# Patient Record
Sex: Female | Born: 1955 | Race: White | Hispanic: No | Smoking: Never smoker
Health system: Southern US, Community
[De-identification: ages and names within clinical notes are randomized; demographics above are authoritative.]

## PROBLEM LIST (undated history)

## (undated) DIAGNOSIS — E669 Obesity, unspecified: Secondary | ICD-10-CM

## (undated) DIAGNOSIS — R002 Palpitations: Secondary | ICD-10-CM

## (undated) DIAGNOSIS — M1990 Unspecified osteoarthritis, unspecified site: Secondary | ICD-10-CM

## (undated) DIAGNOSIS — M549 Dorsalgia, unspecified: Secondary | ICD-10-CM

## (undated) DIAGNOSIS — R7303 Prediabetes: Secondary | ICD-10-CM

## (undated) DIAGNOSIS — J45909 Unspecified asthma, uncomplicated: Secondary | ICD-10-CM

## (undated) DIAGNOSIS — F909 Attention-deficit hyperactivity disorder, unspecified type: Secondary | ICD-10-CM

## (undated) DIAGNOSIS — F32A Depression, unspecified: Secondary | ICD-10-CM

## (undated) DIAGNOSIS — M109 Gout, unspecified: Secondary | ICD-10-CM

## (undated) DIAGNOSIS — K219 Gastro-esophageal reflux disease without esophagitis: Secondary | ICD-10-CM

## (undated) DIAGNOSIS — M797 Fibromyalgia: Secondary | ICD-10-CM

## (undated) DIAGNOSIS — E559 Vitamin D deficiency, unspecified: Secondary | ICD-10-CM

## (undated) DIAGNOSIS — E785 Hyperlipidemia, unspecified: Secondary | ICD-10-CM

## (undated) DIAGNOSIS — G4730 Sleep apnea, unspecified: Secondary | ICD-10-CM

## (undated) DIAGNOSIS — Z86711 Personal history of pulmonary embolism: Secondary | ICD-10-CM

## (undated) DIAGNOSIS — I10 Essential (primary) hypertension: Secondary | ICD-10-CM

## (undated) DIAGNOSIS — H269 Unspecified cataract: Secondary | ICD-10-CM

## (undated) DIAGNOSIS — T7840XA Allergy, unspecified, initial encounter: Secondary | ICD-10-CM

## (undated) DIAGNOSIS — R079 Chest pain, unspecified: Secondary | ICD-10-CM

## (undated) DIAGNOSIS — R0602 Shortness of breath: Secondary | ICD-10-CM

## (undated) DIAGNOSIS — M2550 Pain in unspecified joint: Secondary | ICD-10-CM

## (undated) DIAGNOSIS — M25572 Pain in left ankle and joints of left foot: Secondary | ICD-10-CM

## (undated) DIAGNOSIS — F419 Anxiety disorder, unspecified: Secondary | ICD-10-CM

## (undated) HISTORY — PX: FOOT FRACTURE SURGERY: SHX645

## (undated) HISTORY — PX: OTHER SURGICAL HISTORY: SHX169

## (undated) HISTORY — PX: EYE SURGERY: SHX253

## (undated) HISTORY — DX: Unspecified osteoarthritis, unspecified site: M19.90

## (undated) HISTORY — PX: TUBAL LIGATION: SHX77

## (undated) HISTORY — DX: Essential (primary) hypertension: I10

## (undated) HISTORY — PX: ANKLE SURGERY: SHX546

## (undated) HISTORY — DX: Depression, unspecified: F32.A

## (undated) HISTORY — PX: APPENDECTOMY: SHX54

## (undated) HISTORY — PX: FRACTURE SURGERY: SHX138

## (undated) HISTORY — DX: Unspecified asthma, uncomplicated: J45.909

## (undated) HISTORY — DX: Unspecified cataract: H26.9

## (undated) HISTORY — DX: Obesity, unspecified: E66.9

## (undated) HISTORY — DX: Prediabetes: R73.03

## (undated) HISTORY — DX: Fibromyalgia: M79.7

## (undated) HISTORY — DX: Shortness of breath: R06.02

## (undated) HISTORY — DX: Pain in left ankle and joints of left foot: M25.572

## (undated) HISTORY — DX: Hyperlipidemia, unspecified: E78.5

## (undated) HISTORY — DX: Attention-deficit hyperactivity disorder, unspecified type: F90.9

## (undated) HISTORY — DX: Palpitations: R00.2

## (undated) HISTORY — DX: Allergy, unspecified, initial encounter: T78.40XA

## (undated) HISTORY — DX: Personal history of pulmonary embolism: Z86.711

## (undated) HISTORY — DX: Dorsalgia, unspecified: M54.9

## (undated) HISTORY — DX: Anxiety disorder, unspecified: F41.9

## (undated) HISTORY — DX: Pain in unspecified joint: M25.50

## (undated) HISTORY — DX: Chest pain, unspecified: R07.9

## (undated) HISTORY — DX: Vitamin D deficiency, unspecified: E55.9

## (undated) HISTORY — DX: Gastro-esophageal reflux disease without esophagitis: K21.9

---

## 1982-11-04 HISTORY — PX: APPENDECTOMY: SHX54

## 1992-11-04 HISTORY — PX: TUBAL LIGATION: SHX77

## 1996-11-04 HISTORY — PX: KNEE SURGERY: SHX244

## 1998-01-06 ENCOUNTER — Ambulatory Visit (HOSPITAL_COMMUNITY): Admission: RE | Admit: 1998-01-06 | Discharge: 1998-01-06 | Payer: Self-pay | Admitting: *Deleted

## 1998-08-08 ENCOUNTER — Emergency Department (HOSPITAL_COMMUNITY): Admission: EM | Admit: 1998-08-08 | Discharge: 1998-08-08 | Payer: Self-pay | Admitting: Emergency Medicine

## 1998-12-15 ENCOUNTER — Ambulatory Visit (HOSPITAL_COMMUNITY): Admission: RE | Admit: 1998-12-15 | Discharge: 1998-12-15 | Payer: Self-pay | Admitting: *Deleted

## 1999-10-02 ENCOUNTER — Other Ambulatory Visit: Admission: RE | Admit: 1999-10-02 | Discharge: 1999-10-02 | Payer: Self-pay | Admitting: *Deleted

## 2000-03-17 ENCOUNTER — Emergency Department (HOSPITAL_COMMUNITY): Admission: EM | Admit: 2000-03-17 | Discharge: 2000-03-17 | Payer: Self-pay | Admitting: Emergency Medicine

## 2000-06-19 ENCOUNTER — Ambulatory Visit (HOSPITAL_COMMUNITY): Admission: RE | Admit: 2000-06-19 | Discharge: 2000-06-19 | Payer: Self-pay | Admitting: Family Medicine

## 2000-06-19 ENCOUNTER — Encounter: Payer: Self-pay | Admitting: Family Medicine

## 2000-10-19 ENCOUNTER — Encounter: Payer: Self-pay | Admitting: Sports Medicine

## 2000-10-19 ENCOUNTER — Ambulatory Visit (HOSPITAL_COMMUNITY): Admission: RE | Admit: 2000-10-19 | Discharge: 2000-10-19 | Payer: Self-pay | Admitting: Sports Medicine

## 2000-10-29 ENCOUNTER — Ambulatory Visit (HOSPITAL_COMMUNITY): Admission: RE | Admit: 2000-10-29 | Discharge: 2000-10-29 | Payer: Self-pay | Admitting: Internal Medicine

## 2000-10-29 ENCOUNTER — Encounter: Payer: Self-pay | Admitting: Internal Medicine

## 2000-10-31 ENCOUNTER — Encounter (INDEPENDENT_AMBULATORY_CARE_PROVIDER_SITE_OTHER): Payer: Self-pay | Admitting: Specialist

## 2000-10-31 ENCOUNTER — Encounter: Payer: Self-pay | Admitting: Emergency Medicine

## 2000-11-01 ENCOUNTER — Inpatient Hospital Stay (HOSPITAL_COMMUNITY): Admission: EM | Admit: 2000-11-01 | Discharge: 2000-11-03 | Payer: Self-pay | Admitting: Emergency Medicine

## 2000-11-04 HISTORY — PX: FOOT SURGERY: SHX648

## 2001-01-29 ENCOUNTER — Ambulatory Visit (HOSPITAL_COMMUNITY): Admission: RE | Admit: 2001-01-29 | Discharge: 2001-01-29 | Payer: Self-pay | Admitting: Internal Medicine

## 2001-01-29 ENCOUNTER — Encounter: Payer: Self-pay | Admitting: Internal Medicine

## 2001-11-10 ENCOUNTER — Other Ambulatory Visit: Admission: RE | Admit: 2001-11-10 | Discharge: 2001-11-10 | Payer: Self-pay | Admitting: Internal Medicine

## 2001-11-25 ENCOUNTER — Encounter: Payer: Self-pay | Admitting: Internal Medicine

## 2001-11-25 ENCOUNTER — Ambulatory Visit (HOSPITAL_COMMUNITY): Admission: RE | Admit: 2001-11-25 | Discharge: 2001-11-25 | Payer: Self-pay | Admitting: Internal Medicine

## 2005-03-27 ENCOUNTER — Other Ambulatory Visit: Admission: RE | Admit: 2005-03-27 | Discharge: 2005-03-27 | Payer: Self-pay | Admitting: Internal Medicine

## 2005-11-04 HISTORY — PX: ROTATOR CUFF REPAIR: SHX139

## 2006-08-27 ENCOUNTER — Emergency Department (HOSPITAL_COMMUNITY): Admission: EM | Admit: 2006-08-27 | Discharge: 2006-08-28 | Payer: Self-pay | Admitting: Emergency Medicine

## 2009-09-12 ENCOUNTER — Other Ambulatory Visit: Admission: RE | Admit: 2009-09-12 | Discharge: 2009-09-12 | Payer: Self-pay | Admitting: Internal Medicine

## 2009-09-14 ENCOUNTER — Ambulatory Visit (HOSPITAL_COMMUNITY): Admission: RE | Admit: 2009-09-14 | Discharge: 2009-09-14 | Payer: Self-pay | Admitting: Internal Medicine

## 2009-09-14 IMAGING — CR DG CHEST 2V
3 series · 3 of 3 positions shown · non-contrast
Comparison: None

CLINICAL DATA: Chronic cough and congestion off and on for 3 weeks.
Past history of smoking [AGE] ago.

CHEST - 2 VIEW

[view not recorded (1 of 3)]
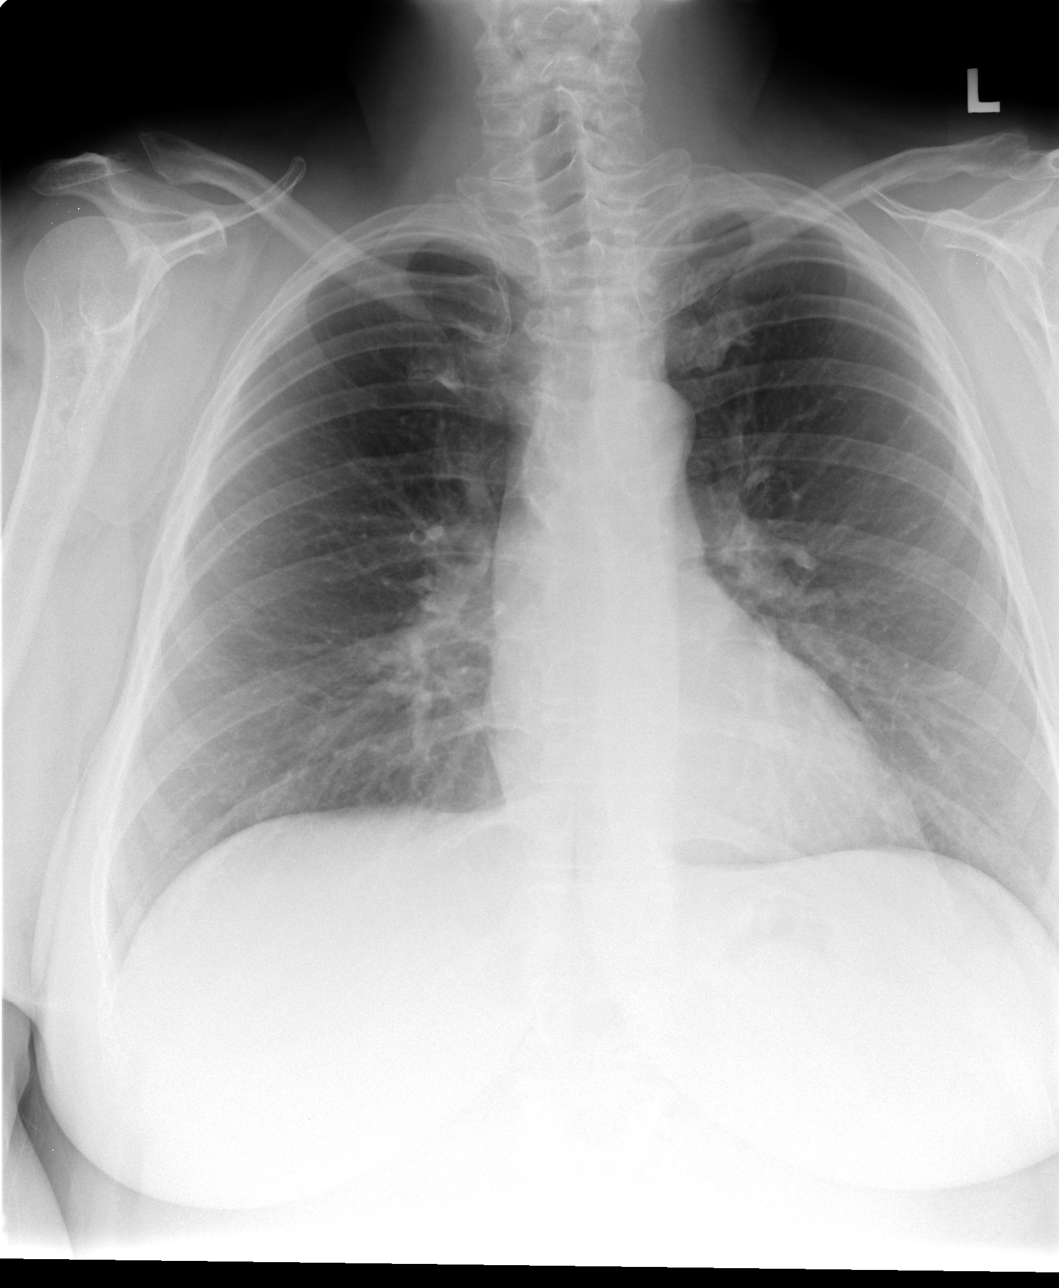

[view not recorded (2 of 3)]
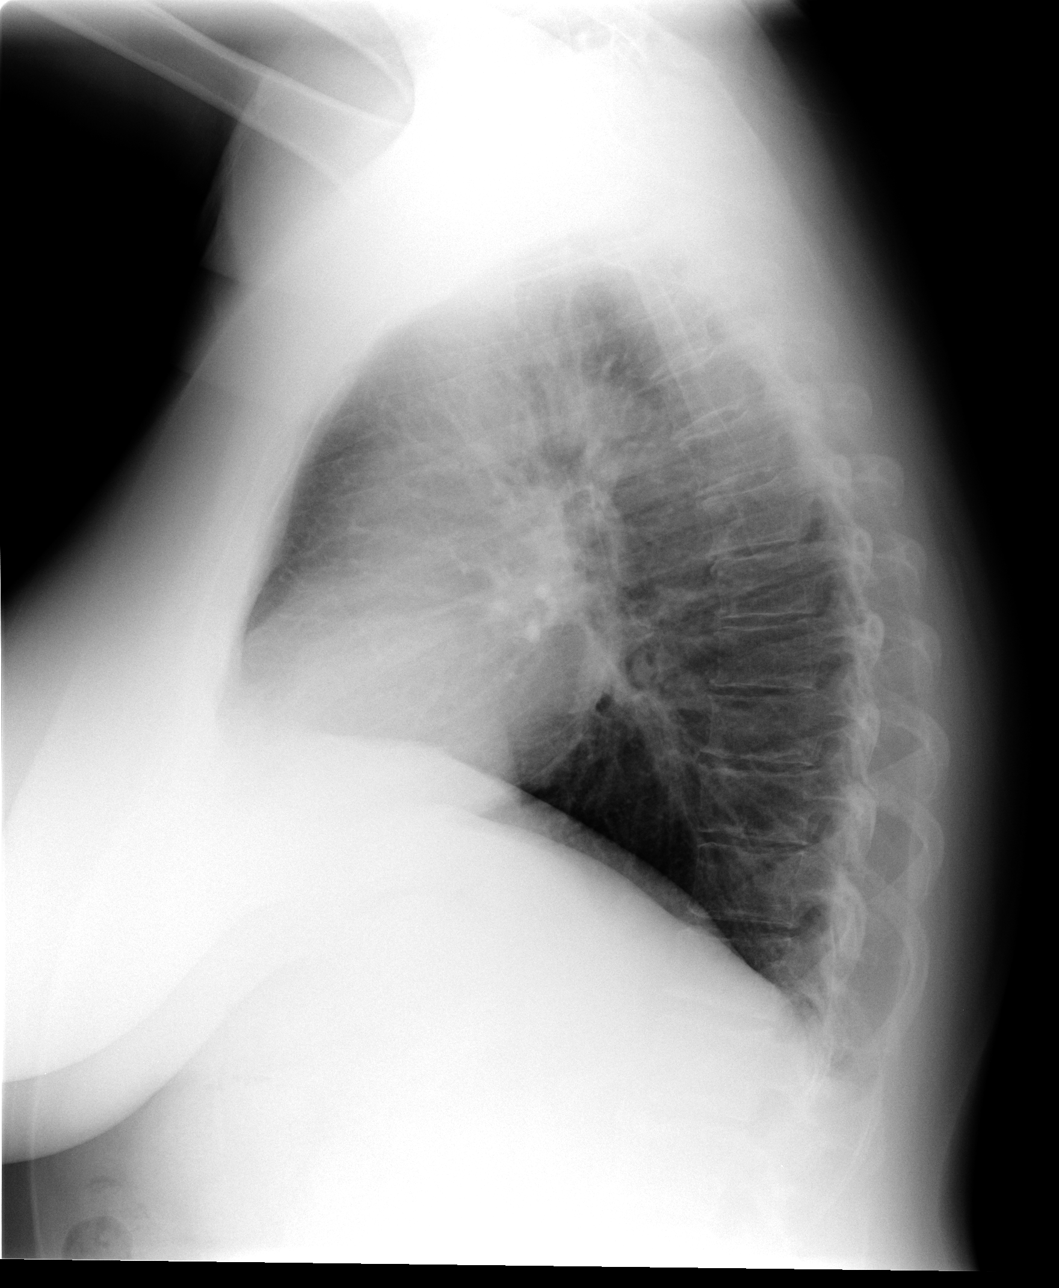

[view not recorded (3 of 3)]
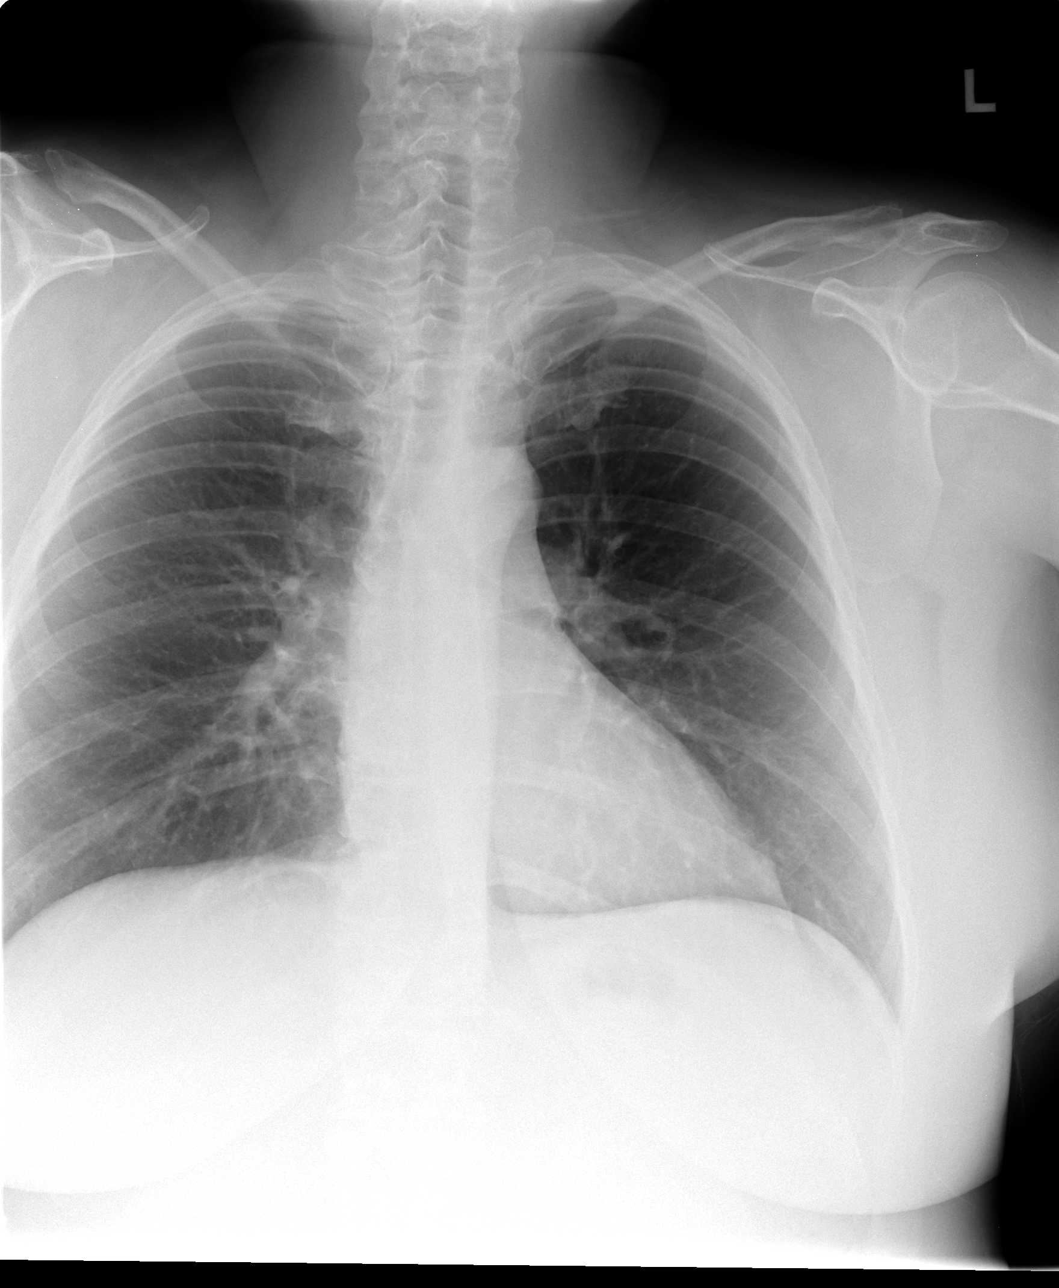

[3 of 3 positions shown; findings below may reference images not displayed]

FINDINGS: Heart and mediastinal contours are within normal limits.
The lung fields appear clear with no evidence for focal infiltrate
or congestive failure.  No pleural fluid is seen.  Mild
peribronchial cuffing is noted and can be seen with bronchitis as
well as a history of smoking.

Bony structures appear intact
IMPRESSION: Mild peribronchial cuffing questionably related to bronchitis or
smoking history.  Otherwise no acute or focal abnormality seen.

## 2009-09-15 ENCOUNTER — Ambulatory Visit (HOSPITAL_COMMUNITY): Admission: RE | Admit: 2009-09-15 | Discharge: 2009-09-15 | Payer: Self-pay | Admitting: Internal Medicine

## 2009-09-22 ENCOUNTER — Ambulatory Visit (HOSPITAL_COMMUNITY): Admission: RE | Admit: 2009-09-22 | Discharge: 2009-09-22 | Payer: Self-pay | Admitting: Internal Medicine

## 2009-09-22 IMAGING — MG MM DIGITAL SCREENING
4 series · 4 of 4 positions shown · non-contrast
Comparison: Prior studies.

DG SCREEN MAMMOGRAM BILATERAL
Bilateral CC and MLO view(s) were taken.

DIGITAL SCREENING MAMMOGRAM WITH CAD:

[R CC]
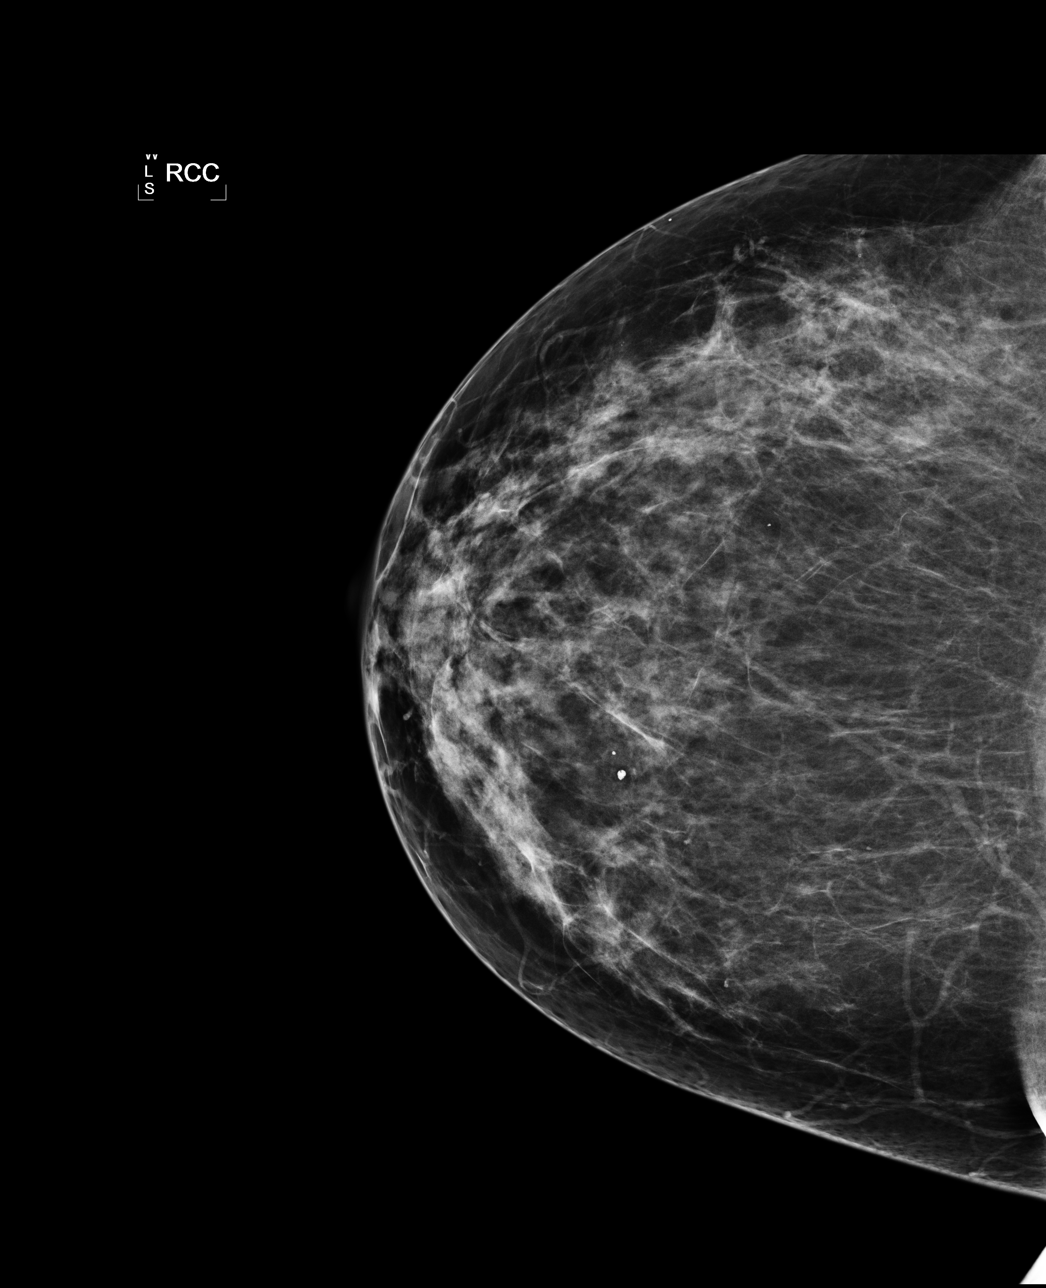

[R MLO]
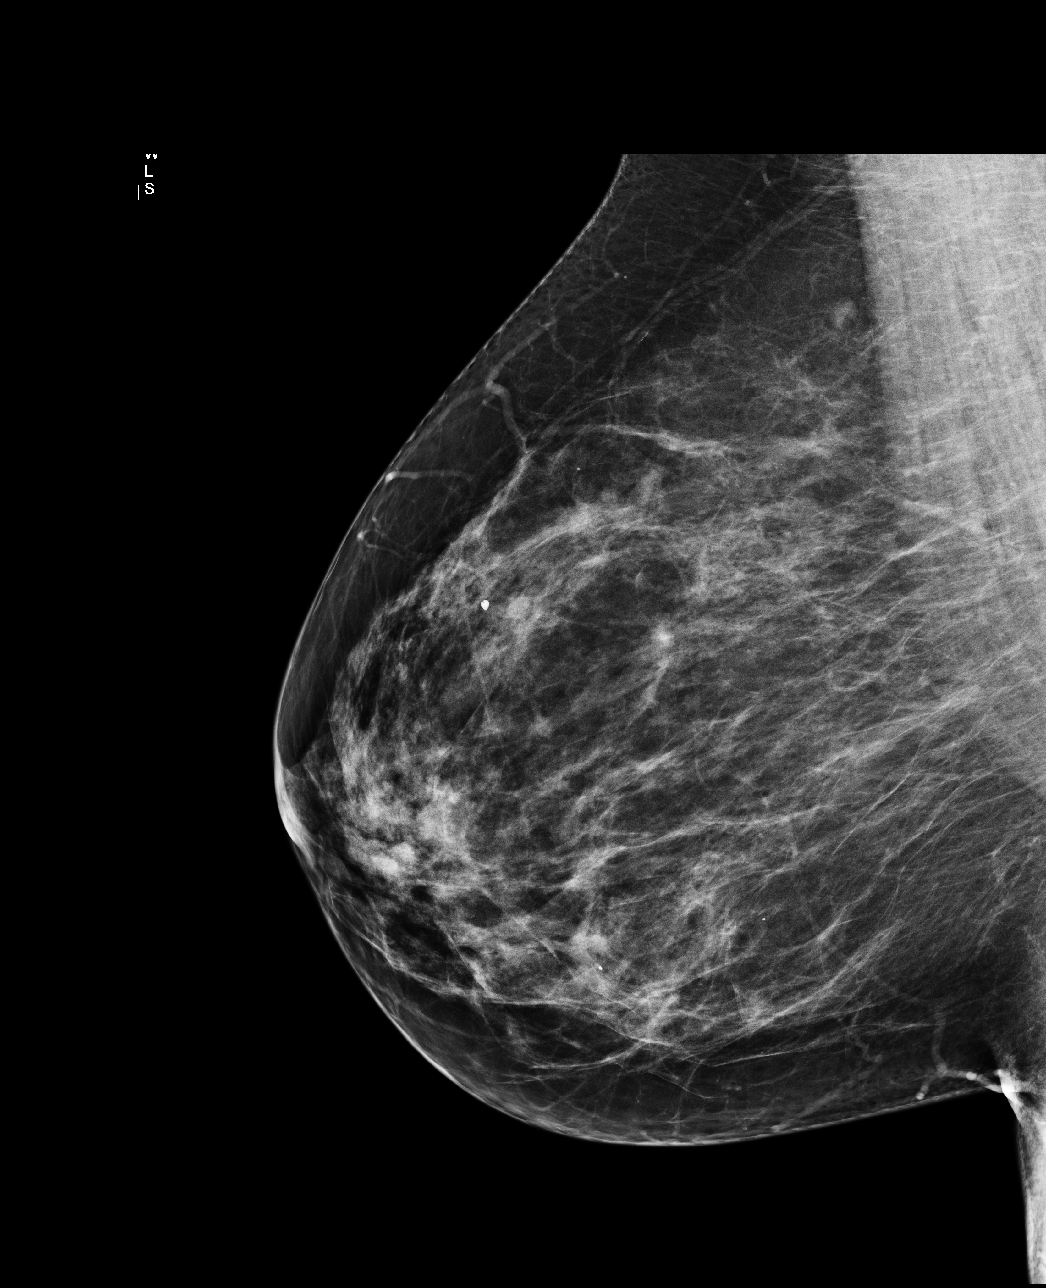

[L CC]
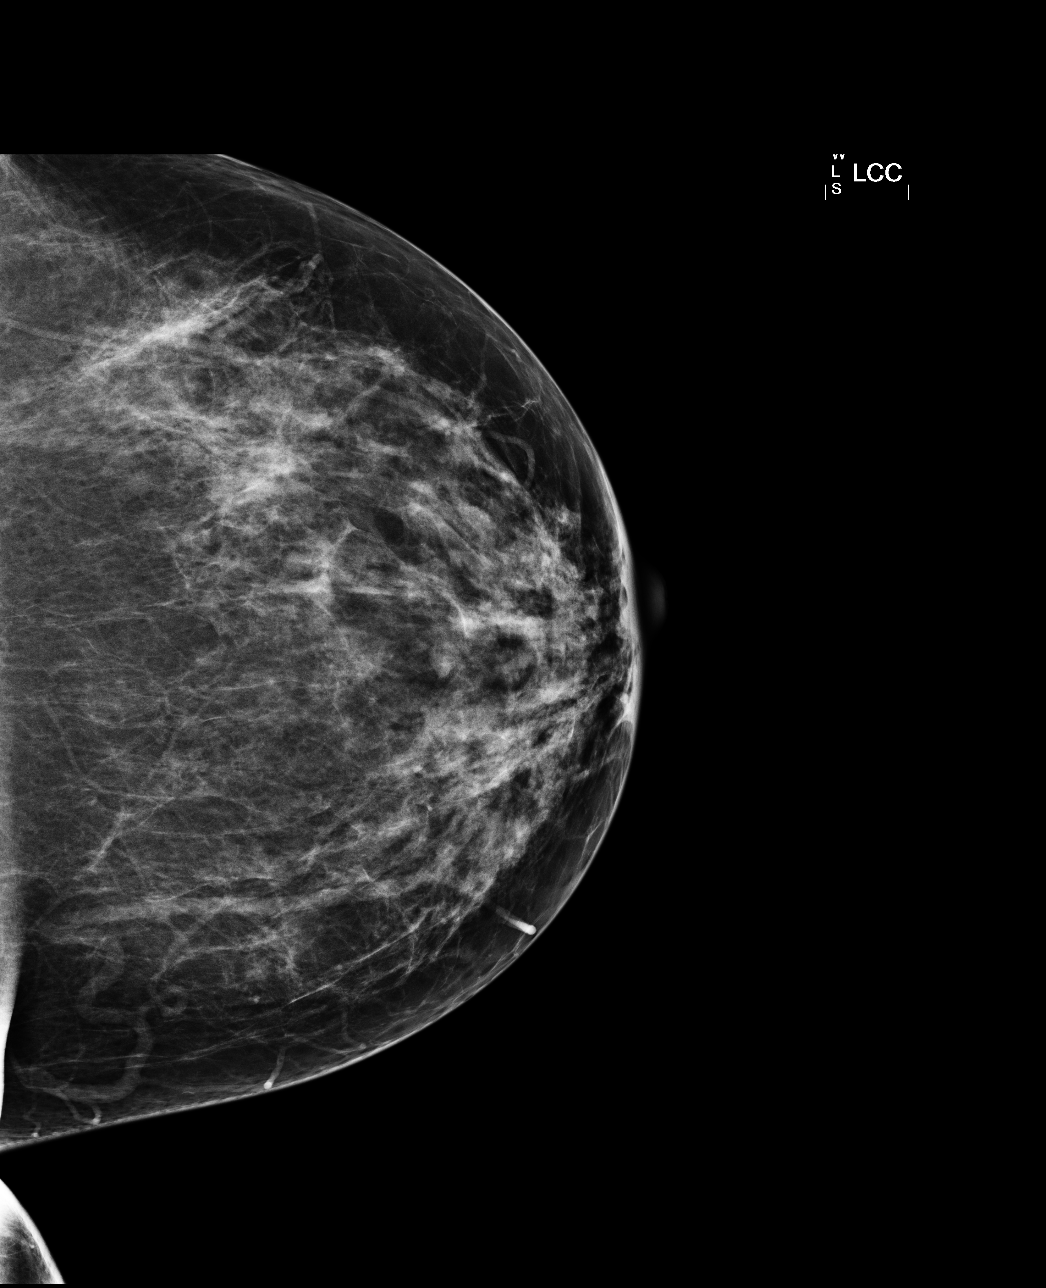

[L MLO]
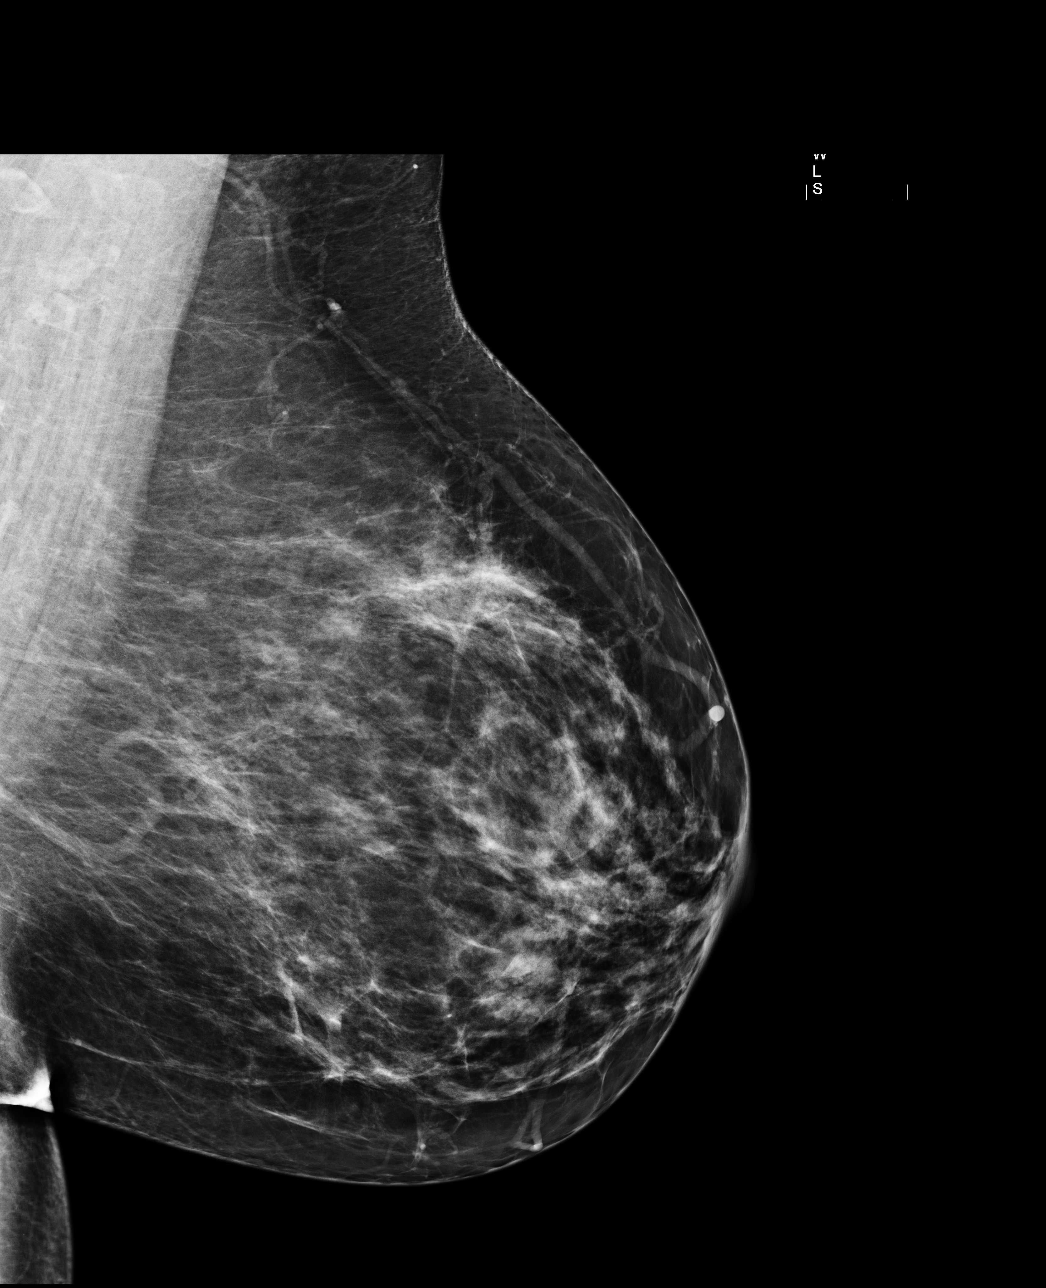

[4 of 4 positions shown; findings below may reference images not displayed]

There are scattered fibroglandular densities.  There is no dominant mass, architectural distortion 
or calcification to suggest malignancy.

Images were processed with CAD.
IMPRESSION: No mammographic evidence of malignancy.  Suggest yearly screening mammography.

A result letter of this screening mammogram will be mailed directly to the patient.

ASSESSMENT: Negative - BI-RADS 1

Screening mammogram in 1 year.
,

## 2011-03-22 NOTE — H&P (Signed)
[place]  Patient:    Haley White, Haley White                         MRN: [id number] Adm. Date:  [id number] Attending:  Devoria Albe CC:         Marinus Maw, M.D.  Rolin Barry Jennye Moccasin, M.D.   History and Physical  CHIEF COMPLAINT:  Pain and drainage from left gluteal area.  HISTORY OF PRESENT ILLNESS:  This is a 56 year old white female, who was previously well.  Approximately five weeks ago, she noticed some pain in her left gluteal area but no skin problems.  About four weeks ago, she noticed some redness and localized swelling in the left gluteal area.  She initially saw Dr. Lucky Cowboy, and it was not clear whether this was an abscess or shingles.  She was placed on Keflex, as well as prednisone and Valtrex.  She states that the pain and swelling actually got worse and did not respond to that medication and she has discontinued those medicines.  For the past two to three weeks she has had a 3 x 6 inch area of swelling in the left gluteal area, which has some discoloration and it feels hot to the touch.  There has been no drainage until recently.  She saw Dr. 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  She has had low-grade fever to 100.  She has had some nausea and actually had some dry heaves today, and low volume diarrhea for the past 48 hours.  She denies any history of trauma, or injection, or foreign body, or documented bite or sting.  She is admitted to the hospital for exploration, drainage and debridement of this area.  PAST MEDICAL HISTORY:  She has had three C-sections.  Status post appendectomy. Status post knee arthroscopy for cartilage damage.  Status post surgery for a left heel spur.  She has asthma.  She has mild depression.  She has hyperlipidemia.  CURRENT  MEDICATIONS: 1. Prozac 40 mg q.d. 2. Lipitor one p.o. q.d. 3. Singulair one tablet p.o. q.d. 4. Advair inhaler one puff q.d.  DRUG ALLERGIES:  None known.  SOCIAL HISTORY:  The patient is currently going through a divorce, which is difficult for her.  She denies the use of tobacco.  Drinks about two glasses of wine per day.  She works and teaches a weekday preschool program.  FAMILY HISTORY:  Father is living, has hypoglycemia and has had some skin cancers, otherwise well.  Mother living, has a history of breast cancer, presumably cured.  One brother and one sister living and well.  REVIEW OF SYSTEMS:  All systems are reviewed and are negative except as described above.  PHYSICAL EXAMINATION:  GENERAL:  A pleasant, middle-aged woman.  Slightly overweight.  VITAL SIGNS:  Temperature 98.1, blood pressure 128/78, heart rate 80, respiratory rate 18.  HEENT:  Sclerae clear.  Extraocular movements intact.  Oropharynx clear.  No lesions.  NECK:  Supple, nontender.  No mass.  No adenopathy.  No thyromegaly and no bruit.  LUNGS:  Clear to auscultation.  No CVA tenderness.  HEART:  Regular rate and rhythm.  No murmur.  BREASTS:  Not examined.  ABDOMEN:  Slightly obese, soft, active bowel sounds.  Mild diffuse  tenderness which is subjective.  Lower midline scar, well-healed.  No hernia.  RECTAL:  No external perianal lesions.  SKIN:  Left gluteal area shows a 3 x 8 inch area laterally, which is tender, somewhat swollen with slight petechiae and slight bruising, but not a classic cellulitis.  There are nodular areas and there are soft areas suggesting underlying fluid collection.  This area is tender.  EXTREMITIES:  No edema.  Good pulses.  NEUROLOGICAL:  Grossly within normal limits.  ADMISSION DATA:  CT scan shows about 3 x 6 inch fluid collection in the superficial subcutaneous tissue.  The muscle layers look fine.  This is a very localized process.  There are no  intra-abdominal problems noted on CT.  White blood cell count 8200, hemoglobin 13.5.  Potassium 3.4.  IMPRESSION: 1. Complex subacute abscess of the left gluteal area.  Etiology is unclear.    Bite or sting is possible.  Foreign body is possible.  Other dermatologic    etiology may be at work. 2. Asthma, mild. 3. Mild depression. 4. Status post multiple cesarean sections.  PLAN:  The patient will be taken to the operating room for examination under anesthesia and drainage and debridement and biopsy of this area with good cultures.  I have discussed the indication in details of this surgery with her.  Risks and complications have been outlined, including but not limited to bleeding, wound healing problems, recurrent infection, other dermatologic problems, anesthetic complications.  She seems to understand all these issues well.  At this time all of her questions are answered.  She is in complete agreement with this plan. DD:  10/31/00 TD:  11/01/00 Job: [id number]

## 2011-03-22 NOTE — Op Note (Signed)
[place]  Patient:    Haley White, Haley White                         MRN: [id number] Proc. Date: 10/31/00 Adm. Date:  [id number] Attending:  Brandy Hale CC:         Marinus Maw, M.D.  Rolin Barry Jennye Moccasin, M.D.   Operative Report  PREOPERATIVE DIAGNOSIS:  Complex abscess left gluteal area.  POSTOPERATIVE DIAGNOSIS:  Complex abscess left gluteal area with soft tissue necrosis.  PROCEDURE:  Incision and drainage of left gluteal abscess, debridement of skin and subcutaneous tissue and muscle.  SURGEON:  Angelia Mould. Derrell Lolling, M.D.  OPERATIVE INDICATIONS:  This 55 year old white female who states that she has had some pain in her left gluteal area for about 5 weeks.  She fell and injured her shoulder about 2 months ago but denies that she struck her hip. No history of bite or sting, no history of injection or foreign body.  No similar problems.  She has had progressive swelling and pain, and erythema. She has has a biopsy performed which was nondiagnostic.  She has had bloody purulent drainage for 2 or 3 days.  She was seen in the ER today.  A CT scan shows a large subcutaneous fluid collection that does not appear to invade the muscle and does not communicate with the intra-abdominal contents.  She has several draining areas on the skin but the skin is not very swollen.  The skin is not red and it is not a classic cellulitis or abscess.  PROCEDURE IN DETAIL:  Following the induction of general endotracheal anesthesia the patient was placed in the right lateral decubitus position. The left gluteal area was prepped and draped in a sterile fashion.  I made a small incision and drained about 500 cc of thin bloody purulent material from the depths of the wound.  We ultimately had to open the skin about 10 inches in length to completely uncover the entire abscess.  The subcutaneous fat was basically necrotic and thin, stringy yellow tissue. There was  very little odor.  There was no fascia visible.  There was a little bit of necrotic fascia and muscle which was debrided.  We debrided some skin and subcutaneous tissue anteriorly and posteriorly simply opened the wound up and debrided the subcutaneous fat.  This did not have a typical appearance, nor did it have a typical odor for necrotizing fasciitis and I did not think that was the etiology.  The muscle was viable and bled easily.  This did not appear to be myonecrosis.  There was no crepitus.  After we had debrided all the skin and subcutaneous tissue and superficial muscle we controlled all the bleeding with cautery.  We irrigated the wound copiously with several 100 cc of saline.  When we were satisfied that we had cleaned up all the necrotic tissue and attained good hemostasis we packed the wound with saline soaked Kerlix gauze and a clean cover bandage.  The patient tolerated the procedure well and was taken to the recovery room in stable condition.  ESTIMATED BLOOD LOSS:  Around 40-50 cc.  COMPLICATIONS:  None.  SPONGE AND INSTRUMENT COUNTS:  Correct. DD:  10/31/00 TD:  11/01/00 Job: [id number]

## 2011-12-10 ENCOUNTER — Ambulatory Visit: Payer: Self-pay | Admitting: Physician Assistant

## 2011-12-10 VITALS — BP 119/82 | HR 82 | Temp 97.9°F | Resp 20 | Ht 63.5 in | Wt 205.0 lb

## 2011-12-10 DIAGNOSIS — J069 Acute upper respiratory infection, unspecified: Secondary | ICD-10-CM

## 2011-12-10 MED ORDER — BENZONATATE 100 MG PO CAPS: 200.0000 mg | ORAL_CAPSULE | Freq: Three times a day (TID) | ORAL | Status: AC | PRN

## 2011-12-10 MED ORDER — HYDROCODONE-HOMATROPINE 5-1.5 MG/5ML PO SYRP: 5.0000 mL | ORAL_SOLUTION | Freq: Four times a day (QID) | ORAL | Status: AC | PRN

## 2011-12-10 NOTE — Progress Notes (Signed)
  Subjective:    Patient ID: Haley White, female    DOB: May 09, 1956, 56 y.o.   MRN: [id number]  HPI Haley White c/o URI sx x 4 days. ST, head congestion, paroxysmal cough.  ST/head congestion improving with Mucinex and other OTC meds.  Cough is getting worse and keeping her up at night.   [place]ed [place] to [place] 1 week ago. Has albuterol inhaler that she has used prn.  Review of Systems  Constitutional: Negative for fever and chills.  HENT: Positive for congestion, sore throat, rhinorrhea and voice change.   Respiratory: Positive for cough. Negative for shortness of breath and wheezing.   Musculoskeletal: Negative for myalgias.  Neurological: Positive for headaches.       Objective:   Physical Exam  Constitutional: She appears well-developed and well-nourished.  HENT:  Right Ear: Tympanic membrane normal.  Left Ear: Tympanic membrane normal.  Nose: Mucosal edema present.  Mouth/Throat: Oropharynx is clear and moist.  Cardiovascular: Regular rhythm.   Pulmonary/Chest: Effort normal.       Tight spastic cough during exam          Assessment & Plan:   1. Acute upper respiratory infections of unspecified site  benzonatate (TESSALON) 100 MG capsule, HYDROcodone-homatropine (HYCODAN) 5-1.5 MG/5ML syrup   Continue Mucinex Use albuterol inhaler 2 puffs q 4-6 hours Push fluids If symproms worsen, RTC Watch for SOB, fever

## 2012-03-02 ENCOUNTER — Ambulatory Visit (HOSPITAL_COMMUNITY)
Admission: RE | Admit: 2012-03-02 | Discharge: 2012-03-02 | Disposition: A | Discharge status: Home / Self Care | Payer: Self-pay | Source: Ambulatory Visit | Attending: Physician Assistant | Admitting: Physician Assistant

## 2012-03-02 ENCOUNTER — Other Ambulatory Visit (HOSPITAL_COMMUNITY): Payer: Self-pay | Admitting: Physician Assistant

## 2012-03-02 DIAGNOSIS — R05 Cough: Secondary | ICD-10-CM

## 2012-03-02 DIAGNOSIS — R059 Cough, unspecified: Secondary | ICD-10-CM | POA: Insufficient documentation

## 2012-03-02 DIAGNOSIS — R509 Fever, unspecified: Secondary | ICD-10-CM | POA: Insufficient documentation

## 2012-03-02 DIAGNOSIS — R062 Wheezing: Secondary | ICD-10-CM | POA: Insufficient documentation

## 2012-03-02 IMAGING — CR DG CHEST 2V
2 series · 2 of 2 positions shown · non-contrast
Comparison: [DATE]

CLINICAL DATA: Intermittent cough and fever.

CHEST - 2 VIEW

[view not recorded (1 of 2)]
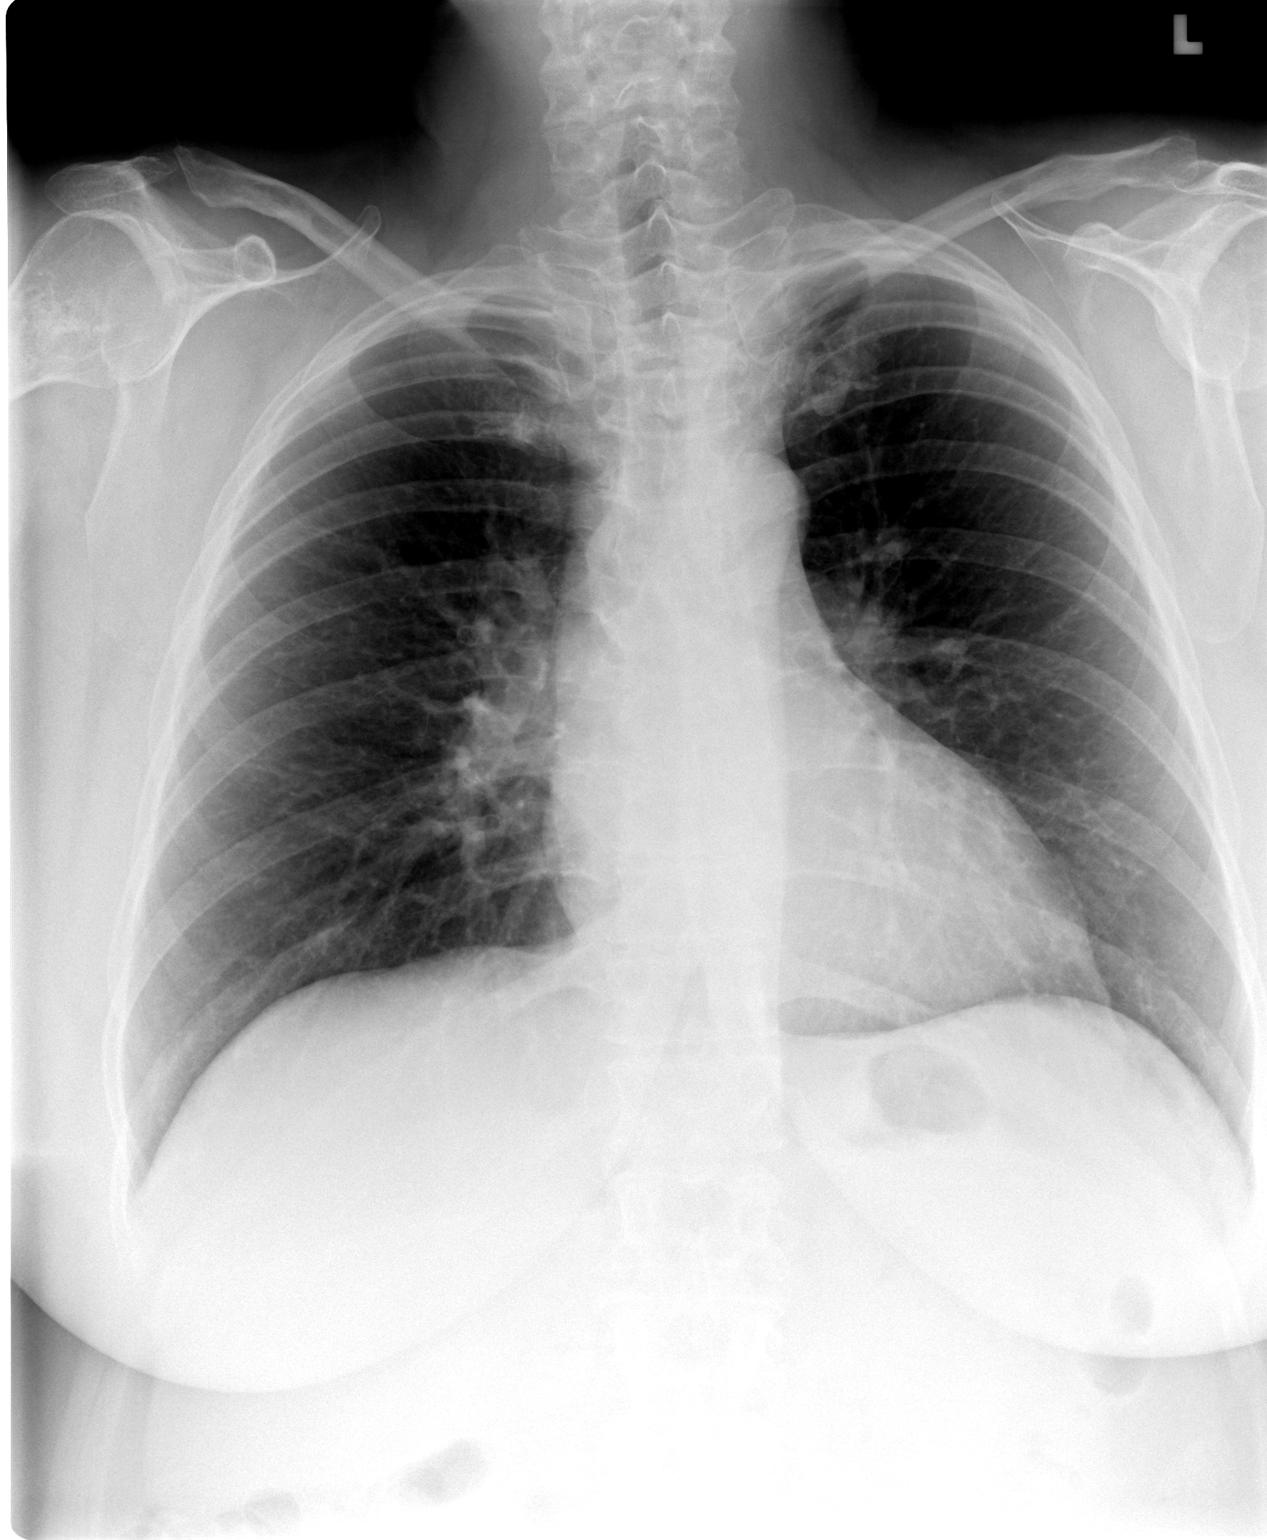

[view not recorded (2 of 2)]
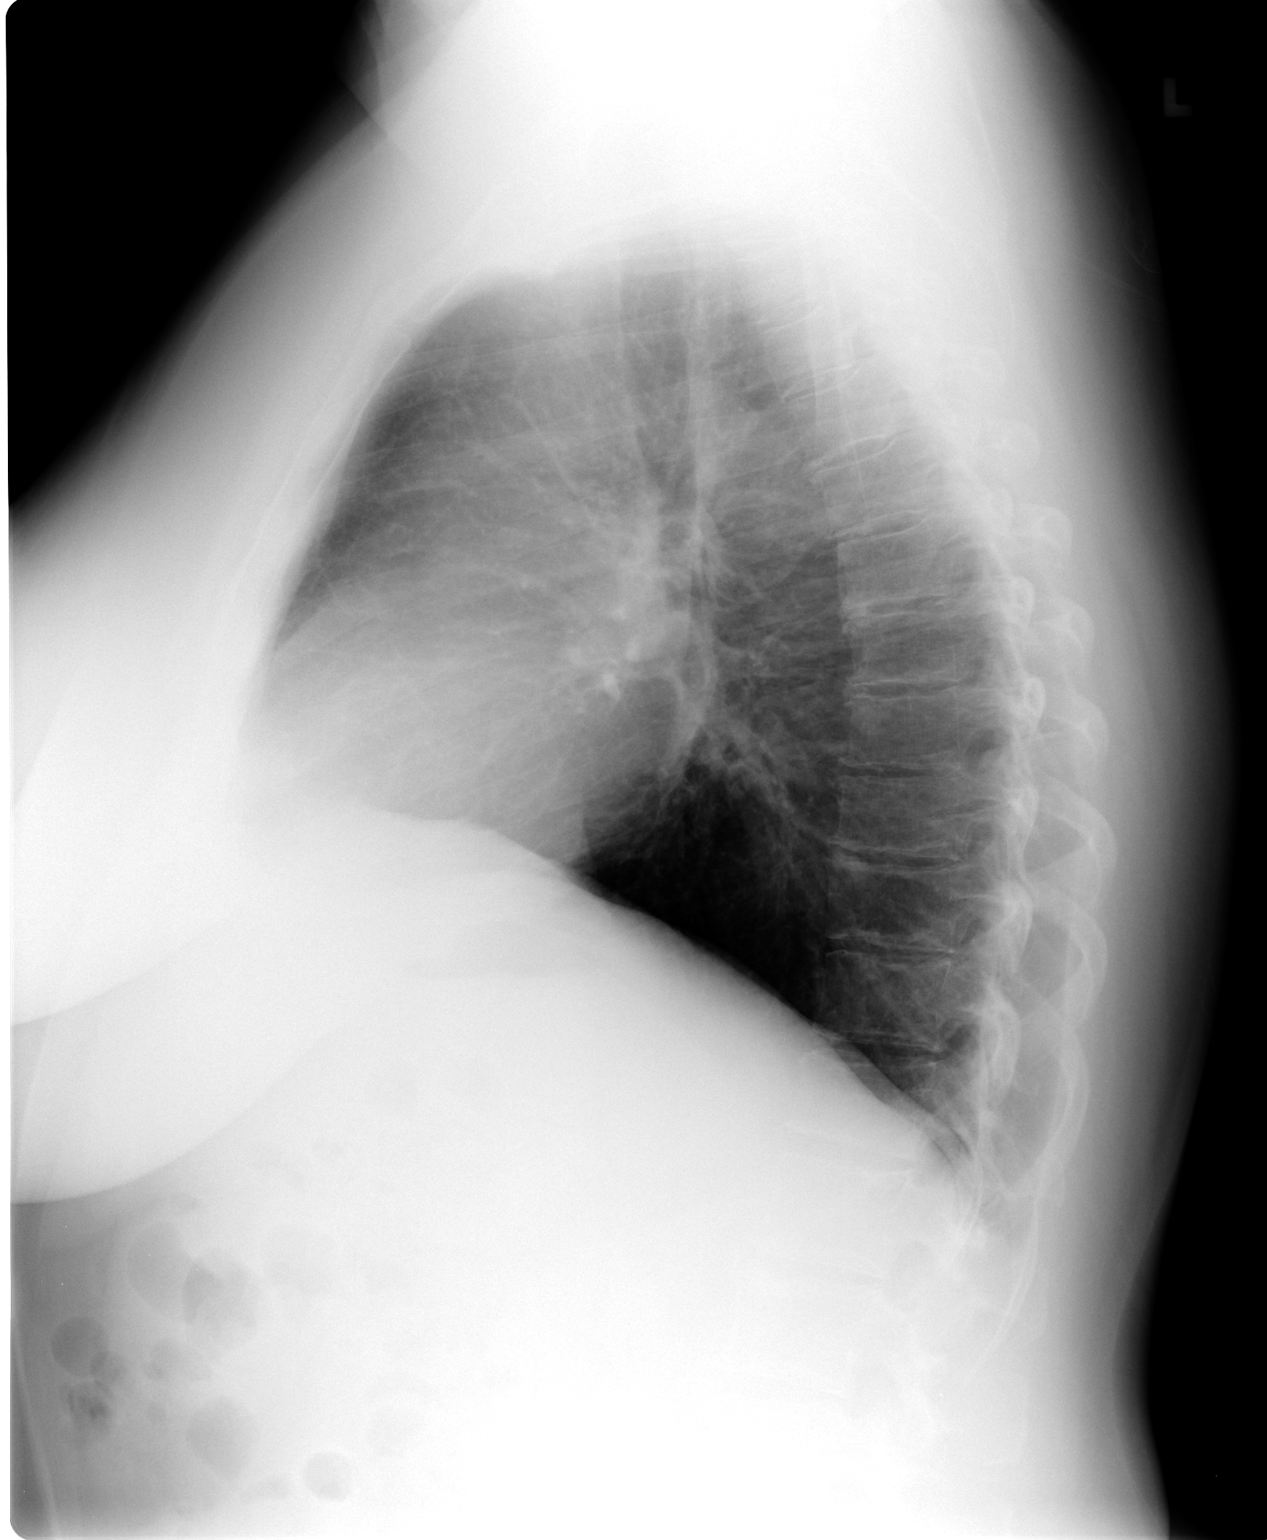

[2 of 2 positions shown; findings below may reference images not displayed]

FINDINGS: The heart size and mediastinal contours are within
normal limits.  Both lungs are clear.  The visualized skeletal
structures are unremarkable.
IMPRESSION: No active cardiopulmonary disease.

## 2012-04-05 ENCOUNTER — Emergency Department (HOSPITAL_COMMUNITY): Payer: Self-pay

## 2012-04-05 ENCOUNTER — Encounter (HOSPITAL_COMMUNITY): Payer: Self-pay | Admitting: *Deleted

## 2012-04-05 ENCOUNTER — Emergency Department (HOSPITAL_COMMUNITY)
Admission: EM | Admit: 2012-04-05 | Discharge: 2012-04-05 | Disposition: A | Discharge status: Home / Self Care | Payer: Self-pay | Attending: Emergency Medicine | Admitting: Emergency Medicine

## 2012-04-05 DIAGNOSIS — R109 Unspecified abdominal pain: Secondary | ICD-10-CM | POA: Insufficient documentation

## 2012-04-05 DIAGNOSIS — M549 Dorsalgia, unspecified: Secondary | ICD-10-CM | POA: Insufficient documentation

## 2012-04-05 LAB — CBC
Hemoglobin: 11.9 g/dL — ABNORMAL LOW (ref 12.0–15.0)
MCHC: 34.9 g/dL (ref 30.0–36.0)
Platelets: 309 10*3/uL (ref 150–400)
RBC: 3.76 MIL/uL — ABNORMAL LOW (ref 3.87–5.11)

## 2012-04-05 LAB — DIFFERENTIAL
Basophils Relative: 0 % (ref 0–1)
Lymphs Abs: 2.2 10*3/uL (ref 0.7–4.0)
Monocytes Relative: 9 % (ref 3–12)
Neutro Abs: 5.5 10*3/uL (ref 1.7–7.7)
Neutrophils Relative %: 61 % (ref 43–77)

## 2012-04-05 LAB — URINALYSIS, ROUTINE W REFLEX MICROSCOPIC
Leukocytes, UA: NEGATIVE
Nitrite: NEGATIVE
Specific Gravity, Urine: 1.019 (ref 1.005–1.030)
Urobilinogen, UA: 1 mg/dL (ref 0.0–1.0)
pH: 7.5 (ref 5.0–8.0)

## 2012-04-05 LAB — BASIC METABOLIC PANEL
BUN: 10 mg/dL (ref 6–23)
Chloride: 106 mEq/L (ref 96–112)
GFR calc Af Amer: >90 mL/min (ref >90)
Potassium: 4 mEq/L (ref 3.5–5.1)
Sodium: 143 mEq/L (ref 135–145)

## 2012-04-05 IMAGING — CT CT ABD-PELV W/O CM
2 of 4 series · 17 of 46 positions shown, 19 images · non-contrast
Comparison: None

CLINICAL DATA: Rule out renal calculi

CT ABDOMEN AND PELVIS WITHOUT CONTRAST
TECHNIQUE: Multidetector CT imaging of the abdomen and pelvis was
performed following the standard protocol without intravenous
contrast.

[Series 2: renal stone · axial · 0.87mm/px · z∈[-441,-26]mm · 14 of 88 slices shown, 16 images]
[im 4/88  soft-tissue]
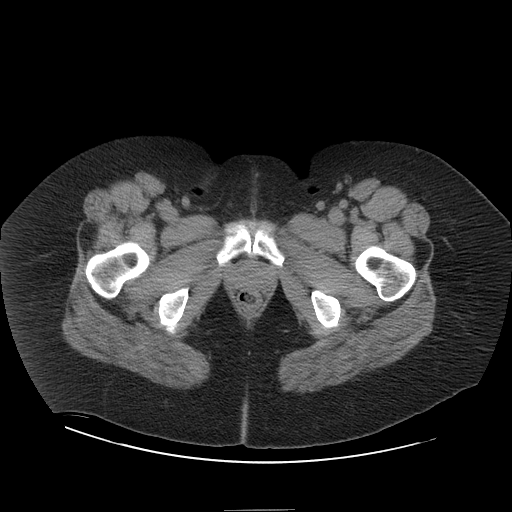
[im 4/88  bone]
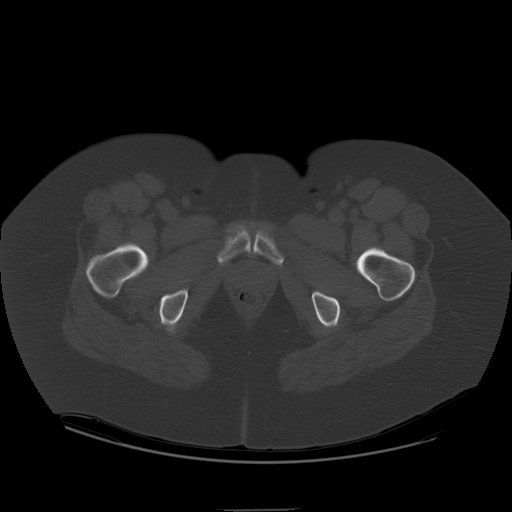
[im 11/88  soft-tissue]
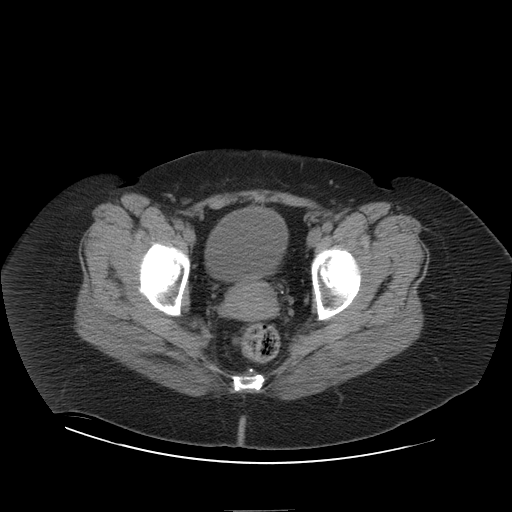
[im 19/88  soft-tissue]
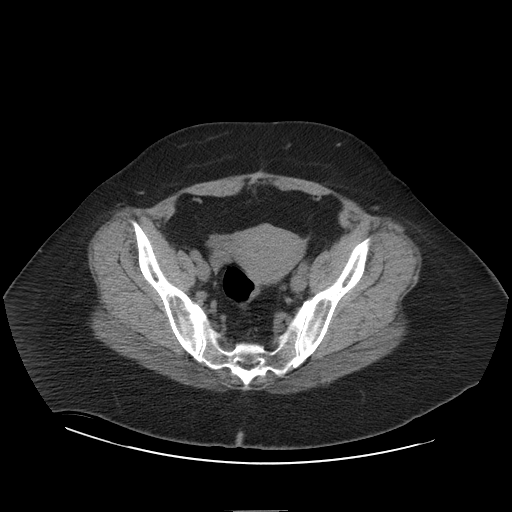
[im 22/88  soft-tissue]
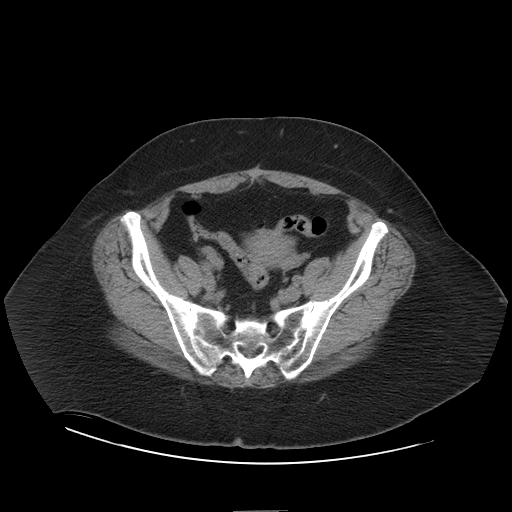
[im 30/88  soft-tissue]
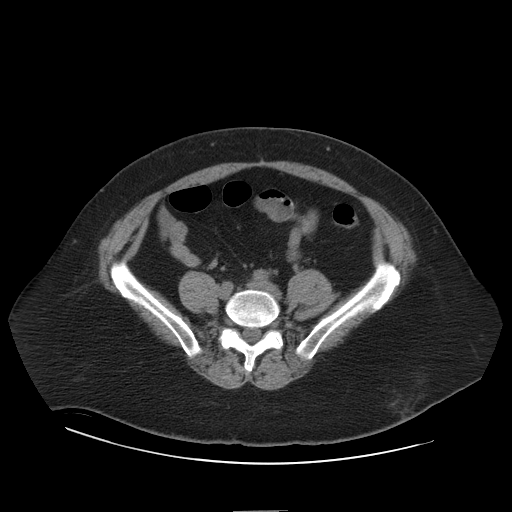
[im 37/88  soft-tissue]
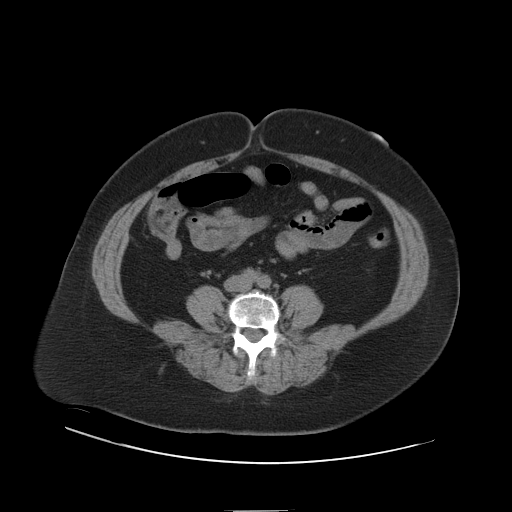
[im 40/88  soft-tissue]
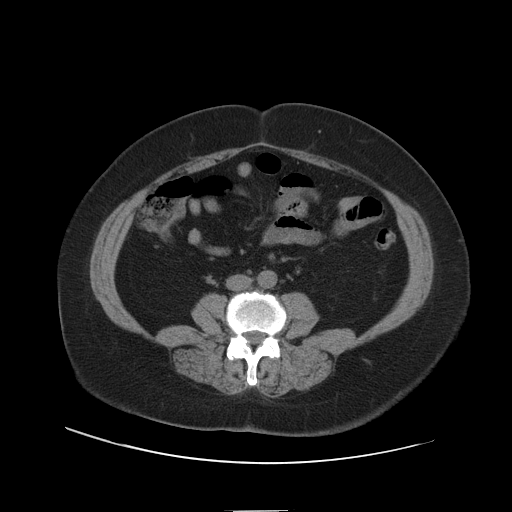
[im 48/88  soft-tissue]
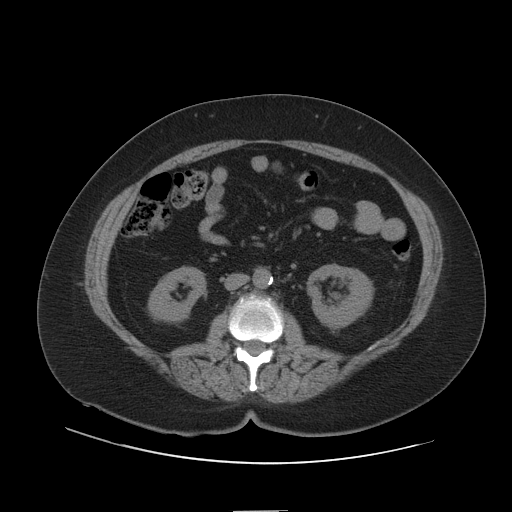
[im 51/88  soft-tissue]
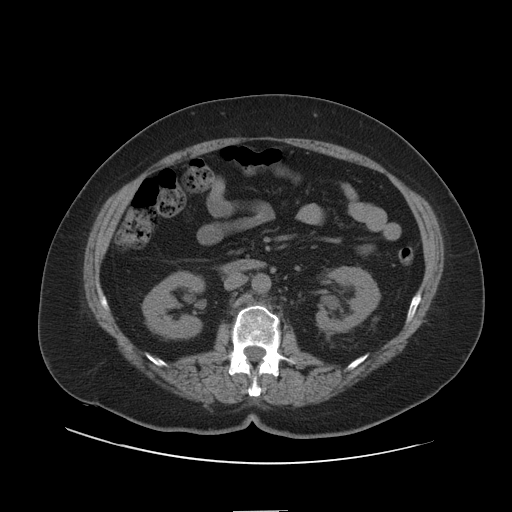
[im 51/88  bone]
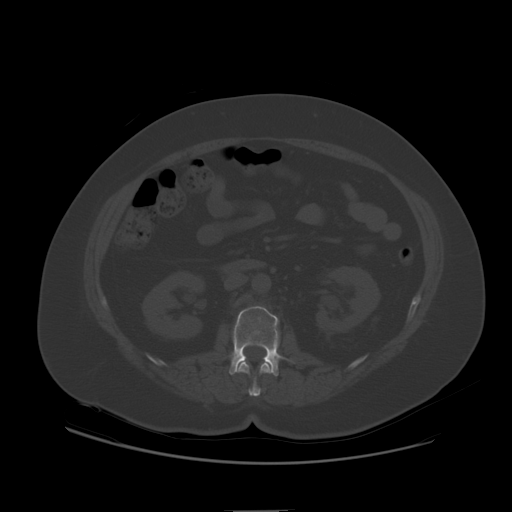
[im 59/88  soft-tissue]
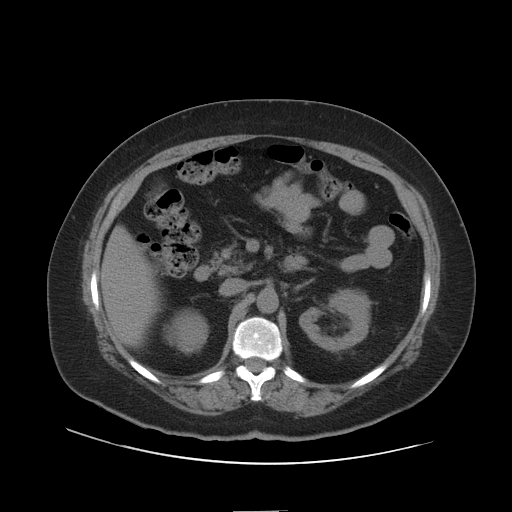
[im 66/88  soft-tissue]
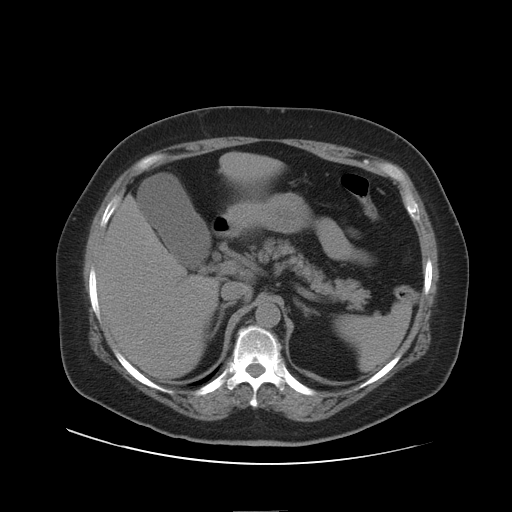
[im 69/88  soft-tissue]
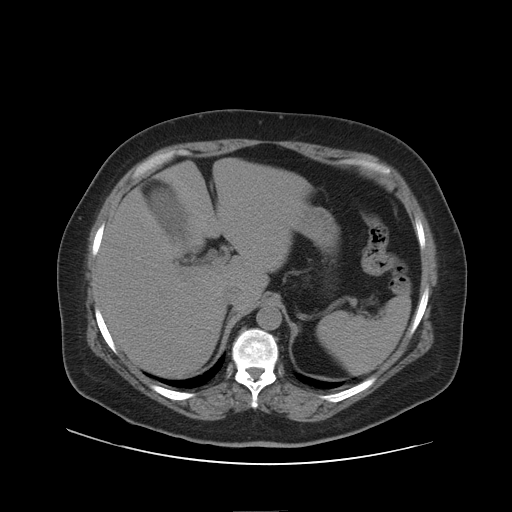
[im 77/88  soft-tissue]
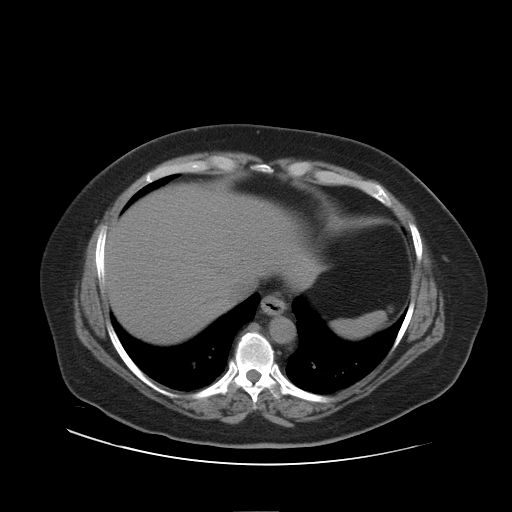
[im 84/88  soft-tissue]
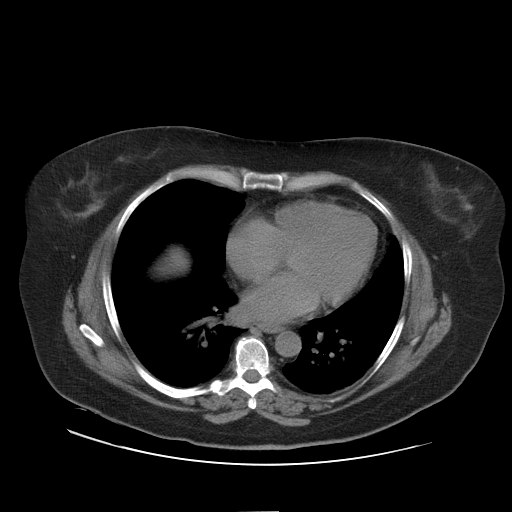

[Series 400: sagittal abd/pel · sagittal · 0.98mm/px · 3 of 121 slices shown]
[im 41/121  soft-tissue]
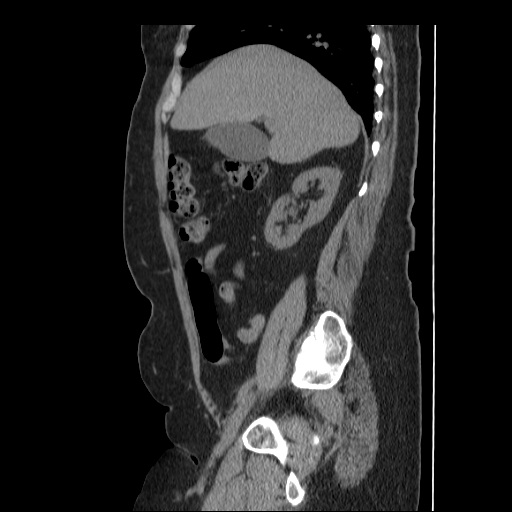
[im 54/121  soft-tissue]
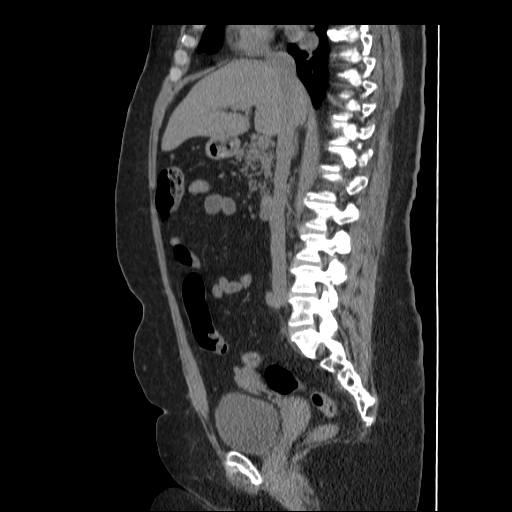
[im 67/121  soft-tissue]
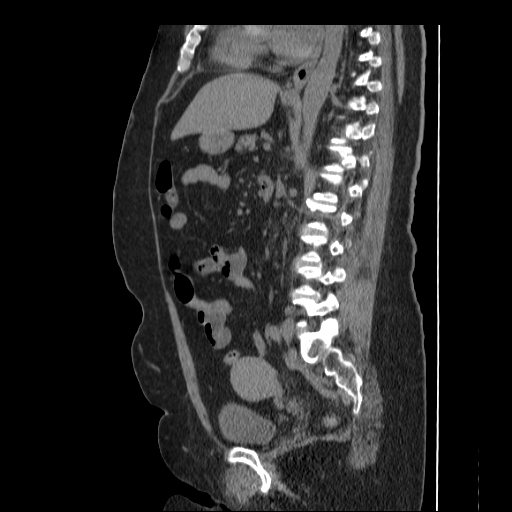

[17 of 46 positions shown; findings below may reference images not displayed]

FINDINGS: The lung bases appear within normal limits.  There is no
pericardial or pleural effusion.

No focal liver abnormality.  The gallbladder appears normal.  There
is no biliary dilatation.  The pancreas is unremarkable.  The
spleen appears normal.

 Both adrenal glands are normal.  The right kidney appears normal.
No right-sided hydronephrosis or nephrolithiasis.  There are
multiple left-sided parapelvic cysts.  No left-sided obstructive
uropathy or nephrolithiasis.  The urinary bladder appears within
normal limits.  The uterus and the adnexal structures are within
normal limits.

No adenopathy within the abdomen or the pelvis.  No inguinal
adenopathy.

No free fluid identified within the abdomen or pelvis.  The stomach
and the small bowel loops are unremarkable.  The colon is normal.

Review of the visualized bony structures is significant for mild
spondylosis within the thoracic spine.
IMPRESSION: 1.  No acute findings identified within the abdomen or pelvis.
2.  No nephrolithiasis or obstructive uropathy.

## 2012-04-05 MED ORDER — ONDANSETRON HCL 4 MG/2ML IJ SOLN
4.0000 mg | Freq: Once | INTRAMUSCULAR | Status: AC
  Administered 2012-04-05: 4 mg via INTRAVENOUS
  Filled 2012-04-05: qty 2

## 2012-04-05 MED ORDER — METHOCARBAMOL 500 MG PO TABS: 500.0000 mg | ORAL_TABLET | Freq: Two times a day (BID) | ORAL | Status: AC

## 2012-04-05 MED ORDER — HYDROMORPHONE HCL PF 1 MG/ML IJ SOLN
1.0000 mg | Freq: Once | INTRAMUSCULAR | Status: AC
  Administered 2012-04-05: 1 mg via INTRAVENOUS
  Filled 2012-04-05: qty 1

## 2012-04-05 MED ORDER — SODIUM CHLORIDE 0.9 % IV BOLUS (SEPSIS)
1000.0000 mL | Freq: Once | INTRAVENOUS | Status: AC
  Administered 2012-04-05: 1000 mL via INTRAVENOUS

## 2012-04-05 MED ORDER — HYDROCODONE-ACETAMINOPHEN 5-325 MG PO TABS: 1.0000 | ORAL_TABLET | Freq: Four times a day (QID) | ORAL | Status: AC | PRN

## 2012-04-05 NOTE — Discharge Instructions (Signed)
Back Pain, Adult Low back pain is very common. About 1 in 5 people have back pain.The cause of low back pain is rarely dangerous. The pain often gets better over time.About half of people with a sudden onset of back pain feel better in just 2 weeks. About 8 in 10 people feel better by 6 weeks.  CAUSES Some common causes of back pain include:  Strain of the muscles or ligaments supporting the spine.   Wear and tear (degeneration) of the spinal discs.   Arthritis.   Direct injury to the back.  DIAGNOSIS Most of the time, the direct cause of low back pain is not known.However, back pain can be treated effectively even when the exact cause of the pain is unknown.Answering your caregiver's questions about your overall health and symptoms is one of the most accurate ways to make sure the cause of your pain is not dangerous. If your caregiver needs more information, he or she may order lab work or imaging tests (X-rays or MRIs).However, even if imaging tests show changes in your back, this usually does not require surgery. HOME CARE INSTRUCTIONS For many people, back pain returns.Since low back pain is rarely dangerous, it is often a condition that people can learn to manageon their own.   Remain active. It is stressful on the back to sit or stand in one place. Do not sit, drive, or stand in one place for more than 30 minutes at a time. Take short walks on level surfaces as soon as pain allows.Try to increase the length of time you walk each day.   Do not stay in bed.Resting more than 1 or 2 days can delay your recovery.   Do not avoid exercise or work.Your body is made to move.It is not dangerous to be active, even though your back may hurt.Your back will likely heal faster if you return to being active before your pain is gone.   Pay attention to your body when you bend and lift. Many people have less discomfortwhen lifting if they bend their knees, keep the load close to their  bodies,and avoid twisting. Often, the most comfortable positions are those that put less stress on your recovering back.   Find a comfortable position to sleep. Use a firm mattress and lie on your side with your knees slightly bent. If you lie on your back, put a pillow under your knees.   Only take over-the-counter or prescription medicines as directed by your caregiver. Over-the-counter medicines to reduce pain and inflammation are often the most helpful.Your caregiver may prescribe muscle relaxant drugs.These medicines help dull your pain so you can more quickly return to your normal activities and healthy exercise.   Put ice on the injured area.   Put ice in a plastic bag.   Place a towel between your skin and the bag.   Leave the ice on for 15 to 20 minutes, 3 to 4 times a day for the first 2 to 3 days. After that, ice and heat may be alternated to reduce pain and spasms.   Ask your caregiver about trying back exercises and gentle massage. This may be of some benefit.   Avoid feeling anxious or stressed.Stress increases muscle tension and can worsen back pain.It is important to recognize when you are anxious or stressed and learn ways to manage it.Exercise is a great option.  SEEK MEDICAL CARE IF:  You have pain that is not relieved with rest or medicine.   You have   pain that does not improve in 1 week.   You have new symptoms.   You are generally not feeling well.  SEEK IMMEDIATE MEDICAL CARE IF:   You have pain that radiates from your back into your legs.   You develop new bowel or bladder control problems.   You have unusual weakness or numbness in your arms or legs.   You develop nausea or vomiting.   You develop abdominal pain.   You feel faint.  Document Released: 10/21/2005 Document Revised: 10/10/2011 Document Reviewed: 03/11/2011 [employer] Patient Information 2012 [employer]. 

## 2012-04-05 NOTE — ED Notes (Signed)
Pt c/o right lower back pain that started around midnight

## 2012-04-05 NOTE — ED Notes (Addendum)
Pt discharged home. Encouraged to follow up if needed. Had no further questions.

## 2012-04-05 NOTE — ED Provider Notes (Signed)
History     CSN: [id number]  Arrival date & time 04/05/12  0546   First MD Initiated Contact with Patient 04/05/12 0606     6:12 AM HPI Pt reports she was woken from sleep about 6 hours ago with right sided flank pain reports pain has gradually worsened and is now severe. Associated with dysuria and a fever. States her Tmas was 99.7 and that she usually runs low. Significant surgical history of 3 C-sections and an appendectomy. Reports pain is comparable to going into labor  Patient is a 56 y.o. female presenting with flank pain. The history is provided by the patient.  Flank Pain This is a new problem. Episode onset: 6 hours ago. The problem occurs constantly. The problem has been gradually worsening. Associated symptoms include abdominal pain and urinary symptoms. Pertinent negatives include no chest pain, chills, fever, nausea, numbness, vomiting or weakness. She has tried nothing for the symptoms.    History reviewed. No pertinent past medical history.  Past Surgical History  Procedure Date  . Knee surgery   . Foot surgery     No family history on file.  History  Substance Use Topics  . Smoking status: Former Games developer  . Smokeless tobacco: Not on file  . Alcohol Use: Yes    OB History    Grav Para Term Preterm Abortions TAB SAB Ect Mult Living                  Review of Systems  Constitutional: Negative for fever and chills.  Respiratory: Negative for shortness of breath.   Cardiovascular: Negative for chest pain.  Gastrointestinal: Positive for abdominal pain. Negative for nausea, vomiting, diarrhea and constipation.  Genitourinary: Positive for frequency and flank pain. Negative for dysuria, urgency, hematuria, vaginal discharge and vaginal pain.  Musculoskeletal: Positive for back pain.  Neurological: Negative for weakness and numbness.  All other systems reviewed and are negative.    Allergies  Review of patient's allergies indicates no known  allergies.  Home Medications   Current Outpatient Rx  Name Route Sig Dispense Refill  . ALBUTEROL SULFATE HFA 108 (90 BASE) MCG/ACT IN AERS Inhalation Inhale 2 puffs into the lungs every 6 (six) hours as needed. wheezing    . VITAMIN C PO Oral Take 1 tablet by mouth daily.    Marland Kitchen CALCIUM + D PO Oral Take 1 tablet by mouth daily.    Marland Kitchen FLUOXETINE HCL 20 MG PO TABS Oral Take 20 mg by mouth daily.    . METHYLPHENIDATE HCL 20 MG PO TABS Oral Take 20 mg by mouth 2 (two) times daily.    . MOMETASONE FUROATE 50 MCG/ACT NA SUSP Nasal Place 2 sprays into the nose daily as needed. Nasal congestion      BP 133/97  Pulse 101  Temp(Src) 98.1 F (36.7 C) (Oral)  Resp 24  SpO2 100%  Physical Exam  Vitals reviewed. Constitutional: She is oriented to person, place, and time. Vital signs are normal. She appears well-developed and well-nourished.  HENT:  Head: Normocephalic and atraumatic.  Eyes: Conjunctivae are normal. Pupils are equal, round, and reactive to light.  Neck: Normal range of motion. Neck supple.  Cardiovascular: Normal rate, regular rhythm and normal heart sounds.  Exam reveals no friction rub.   No murmur heard. Pulmonary/Chest: Effort normal and breath sounds normal. She has no wheezes. She has no rhonchi. She has no rales. She exhibits no tenderness.  Abdominal: Soft. Normal appearance and bowel sounds are normal.  There is no tenderness. There is CVA tenderness (Severe right sided  ). There is no rigidity, no rebound, no guarding, no tenderness at McBurney's point and negative Murphy's sign.  Musculoskeletal: Normal range of motion.  Neurological: She is alert and oriented to person, place, and time. Coordination normal.  Skin: Skin is warm and dry. No rash noted. No erythema. No pallor.    ED Course  Procedures  Results for orders placed during the hospital encounter of 04/05/12  URINALYSIS, ROUTINE W REFLEX MICROSCOPIC      Component Value Range   Color, Urine YELLOW  YELLOW     APPearance CLEAR  CLEAR    Specific Gravity, Urine 1.019  1.005 - 1.030    pH 7.5  5.0 - 8.0    Glucose, UA NEGATIVE  NEGATIVE (mg/dL)   Hgb urine dipstick NEGATIVE  NEGATIVE    Bilirubin Urine NEGATIVE  NEGATIVE    Ketones, ur NEGATIVE  NEGATIVE (mg/dL)   Protein, ur NEGATIVE  NEGATIVE (mg/dL)   Urobilinogen, UA 1.0  0.0 - 1.0 (mg/dL)   Nitrite NEGATIVE  NEGATIVE    Leukocytes, UA NEGATIVE  NEGATIVE   CBC      Component Value Range   WBC 8.9  4.0 - 10.5 (K/uL)   RBC 3.76 (*) 3.87 - 5.11 (MIL/uL)   Hemoglobin 11.9 (*) 12.0 - 15.0 (g/dL)   HCT 45.4 (*) 09.8 - 46.0 (%)   MCV 90.7  78.0 - 100.0 (fL)   MCH 31.6  26.0 - 34.0 (pg)   MCHC 34.9  30.0 - 36.0 (g/dL)   RDW 11.9  14.7 - 82.9 (%)   Platelets 309  150 - 400 (K/uL)  DIFFERENTIAL      Component Value Range   Neutrophils Relative 61  43 - 77 (%)   Neutro Abs 5.5  1.7 - 7.7 (K/uL)   Lymphocytes Relative 24  12 - 46 (%)   Lymphs Abs 2.2  0.7 - 4.0 (K/uL)   Monocytes Relative 9  3 - 12 (%)   Monocytes Absolute 0.8  0.1 - 1.0 (K/uL)   Eosinophils Relative 5  0 - 5 (%)   Eosinophils Absolute 0.5  0.0 - 0.7 (K/uL)   Basophils Relative 0  0 - 1 (%)   Basophils Absolute 0.0  0.0 - 0.1 (K/uL)  BASIC METABOLIC PANEL      Component Value Range   Sodium 143  135 - 145 (mEq/L)   Potassium 4.0  3.5 - 5.1 (mEq/L)   Chloride 106  96 - 112 (mEq/L)   CO2 23  19 - 32 (mEq/L)   Glucose, Bld 100 (*) 70 - 99 (mg/dL)   BUN 10  6 - 23 (mg/dL)   Creatinine, Ser 5.62  0.50 - 1.10 (mg/dL)   Calcium 8.8  8.4 - 13.0 (mg/dL)   GFR calc non Af Amer >90  >90 (mL/min)   GFR calc Af Amer >90  >90 (mL/min)   Ct Abdomen Pelvis Wo Contrast  04/05/2012  *RADIOLOGY REPORT*  Clinical Data: Rule out renal calculi  CT ABDOMEN AND PELVIS WITHOUT CONTRAST  Technique:  Multidetector CT imaging of the abdomen and pelvis was performed following the standard protocol without intravenous contrast.  Comparison: None  Findings: The lung bases appear within normal  limits.  There is no pericardial or pleural effusion.  No focal liver abnormality.  The gallbladder appears normal.  There is no biliary dilatation.  The pancreas is unremarkable.  The spleen appears normal.  Both adrenal glands are normal.  The right kidney appears normal. No right-sided hydronephrosis or nephrolithiasis.  There are multiple left-sided parapelvic cysts.  No left-sided obstructive uropathy or nephrolithiasis.  The urinary bladder appears within normal limits.  The uterus and the adnexal structures are within normal limits.  No adenopathy within the abdomen or the pelvis.  No inguinal adenopathy.  No free fluid identified within the abdomen or pelvis.  The stomach and the small bowel loops are unremarkable.  The colon is normal.  Review of the visualized bony structures is significant for mild spondylosis within the thoracic spine.  IMPRESSION:  1.  No acute findings identified within the abdomen or pelvis. 2.  No nephrolithiasis or obstructive uropathy.  Original Report Authenticated By: Rosealee Albee, M.D.     MDM  6:28 AM Suspect ureterolithiasis. Will order labs and Ct abdomen/pelvis   9:14 AM Pain improved 5/10. Labs and imaging show no acute cause of pain. Will treat for flank/back pain. And advised close f/u with PCP next week.Patient voices understanding and is ready for d/c       Thomasene Lot, PA-C 04/05/12 1610

## 2012-04-06 NOTE — ED Provider Notes (Signed)
Medical screening examination/treatment/procedure(s) were performed by non-physician practitioner and as supervising physician I was immediately available for consultation/collaboration.  Srihith Aquilino, MD 04/06/12 0702 

## 2012-05-18 ENCOUNTER — Other Ambulatory Visit (HOSPITAL_COMMUNITY): Payer: Self-pay | Admitting: Internal Medicine

## 2012-05-18 ENCOUNTER — Ambulatory Visit (HOSPITAL_COMMUNITY)
Admission: RE | Admit: 2012-05-18 | Discharge: 2012-05-18 | Disposition: A | Discharge status: Home / Self Care | Payer: Self-pay | Source: Ambulatory Visit | Attending: Internal Medicine | Admitting: Internal Medicine

## 2012-05-18 DIAGNOSIS — R937 Abnormal findings on diagnostic imaging of other parts of musculoskeletal system: Secondary | ICD-10-CM

## 2012-05-18 IMAGING — CR DG HUMERUS 2V *R*
2 series · 2 of 2 positions shown · non-contrast
Comparison: Chest x-rays dated [DATE] and [DATE]

CLINICAL DATA: Calcifications of the proximal right uterus seen on
recent rib x-ray

RIGHT HUMERUS - 2+ VIEW

[view not recorded (1 of 2)]
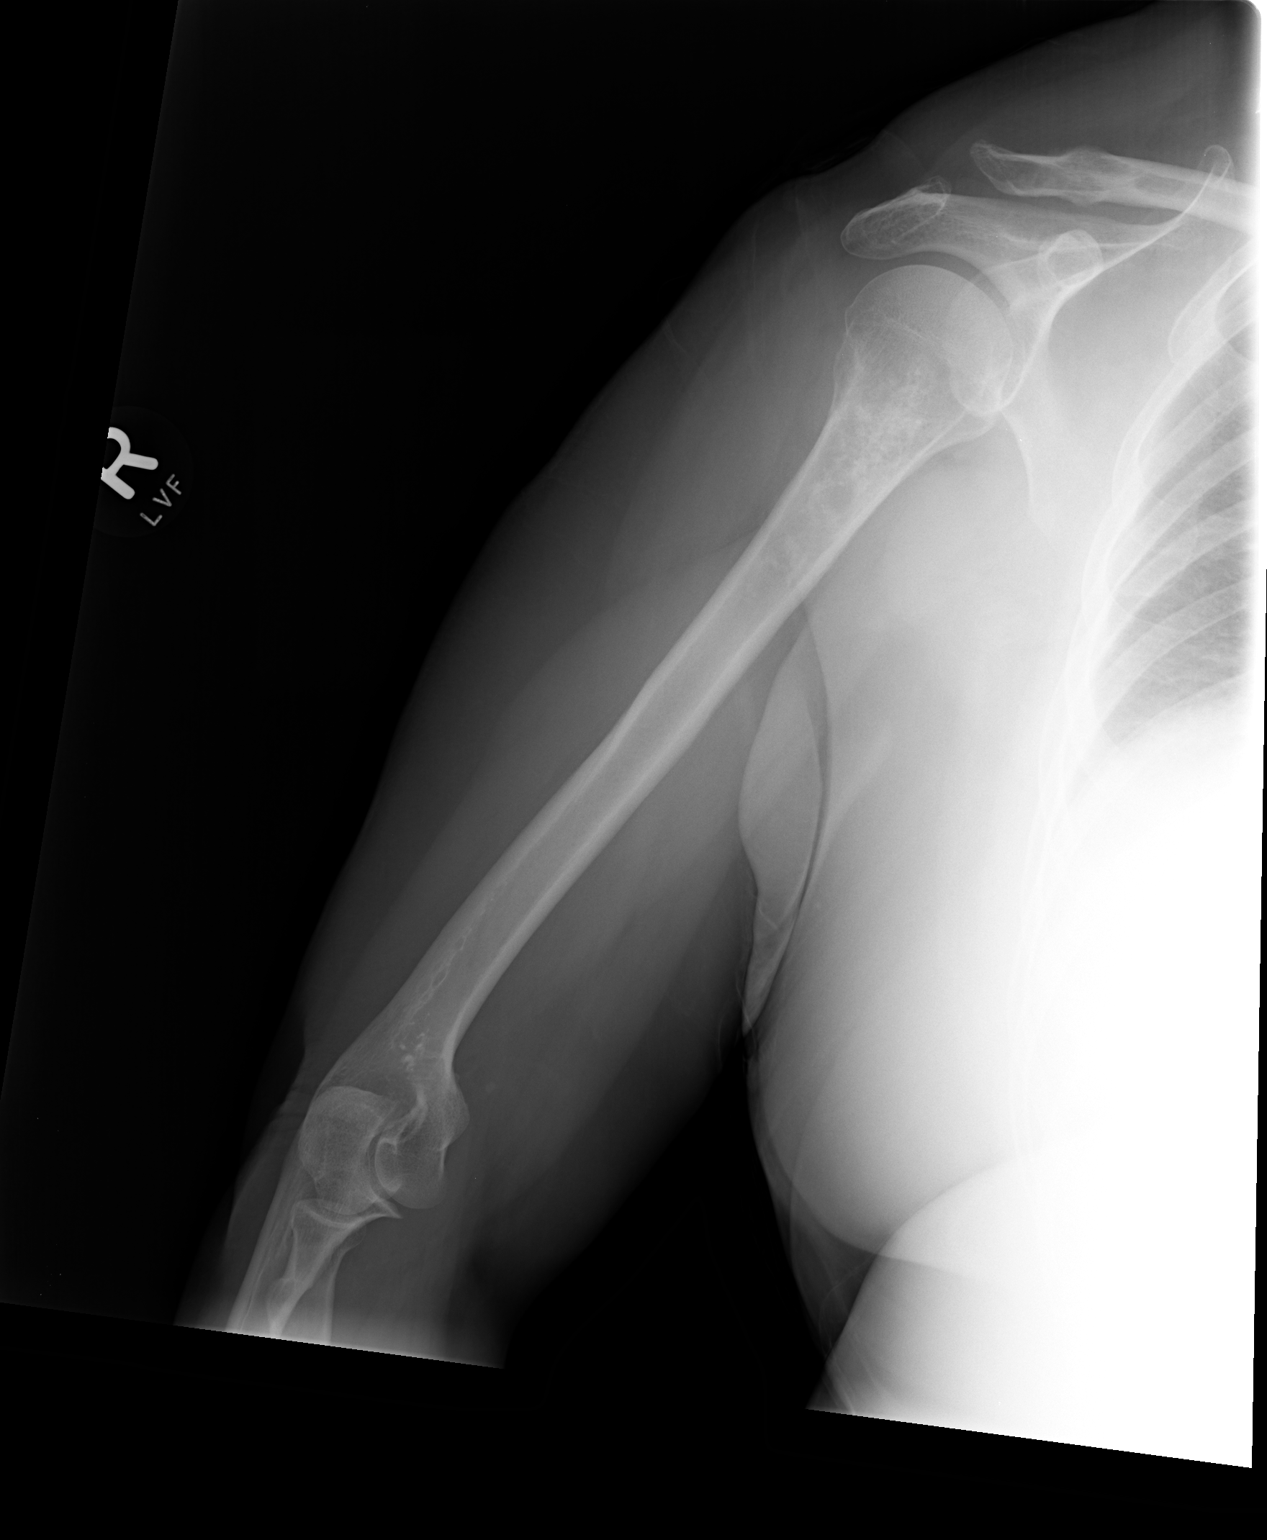

[view not recorded (2 of 2)]
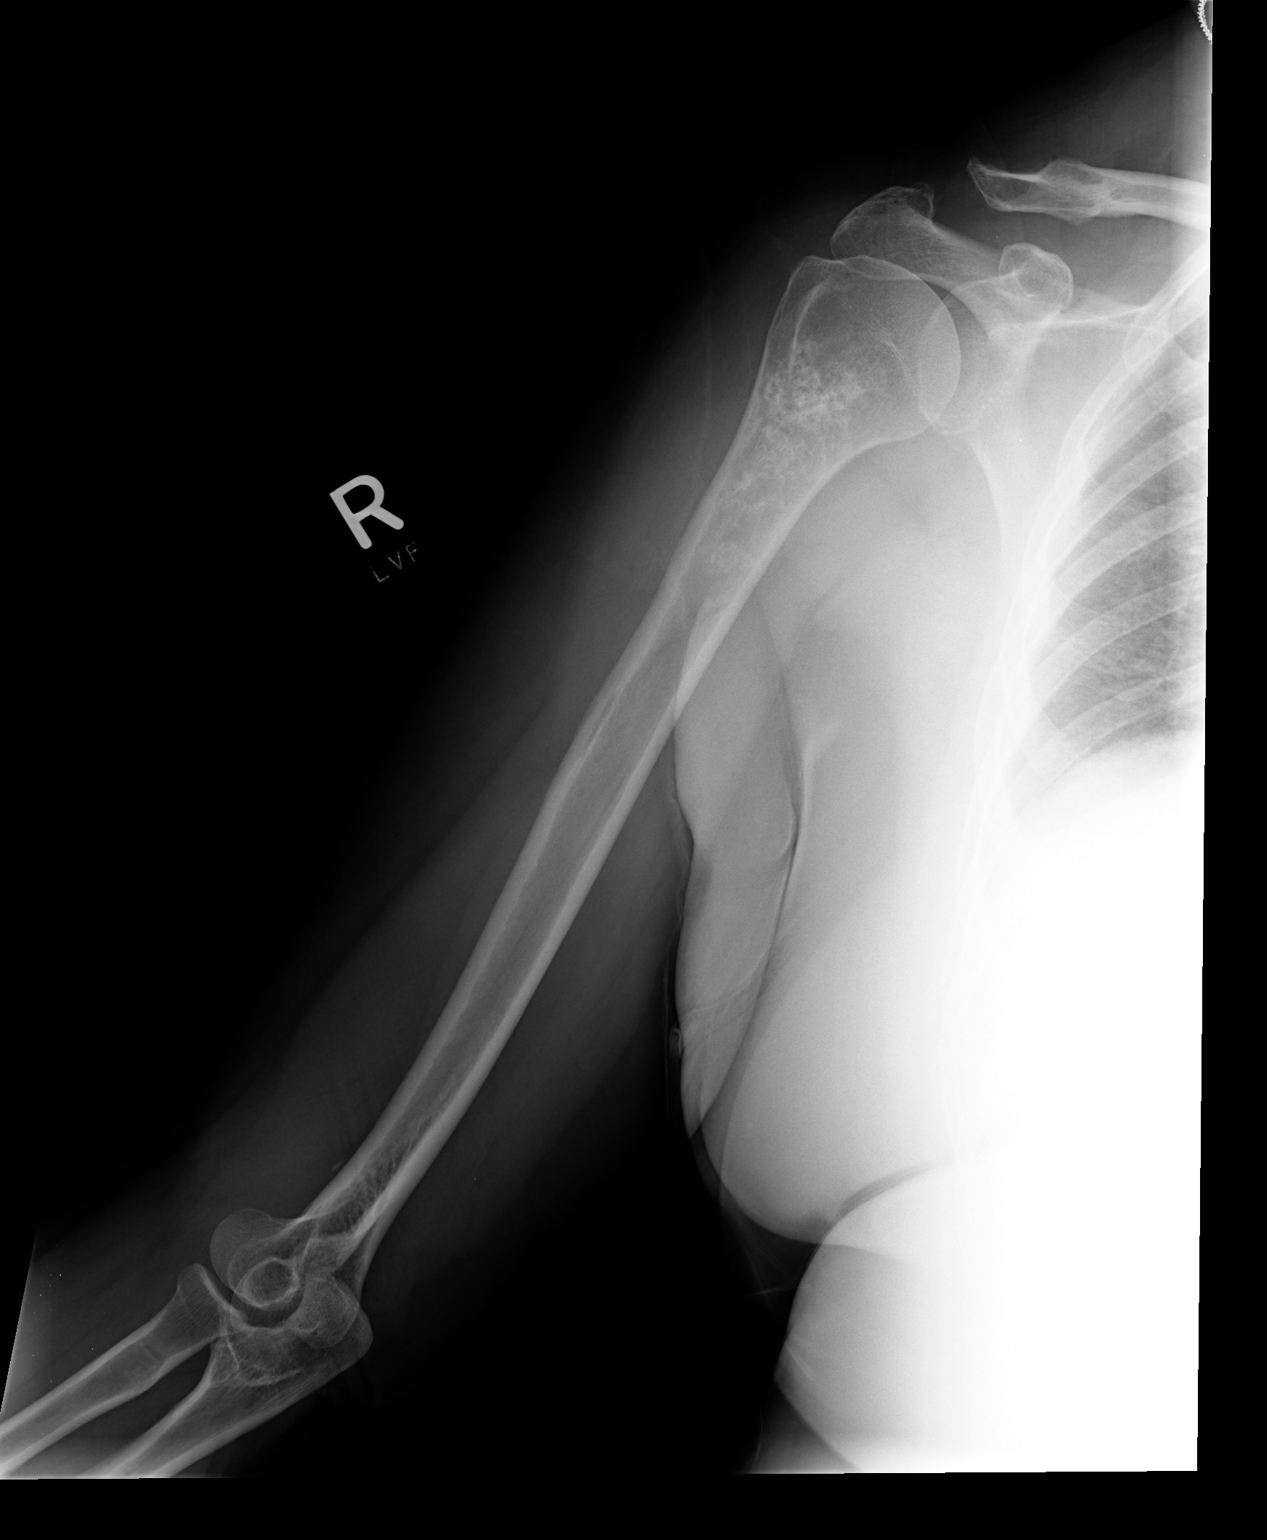

[2 of 2 positions shown; findings below may reference images not displayed]

FINDINGS: There is HUYEN QUY reticulation of the proximal right humeral
shaft without evidence of expansion, cortical thinning, or
periosteal reaction.  The AC joint is intact and the subacromial
space is maintained.  The appearance is unchanged compared with the
prior chest x-rays.
IMPRESSION: Stable appearance of proximal right humeral bone infarct versus
enchondroma.

## 2012-11-04 HISTORY — PX: CATARACT EXTRACTION W/ INTRAOCULAR LENS  IMPLANT, BILATERAL: SHX1307

## 2013-04-24 ENCOUNTER — Ambulatory Visit (INDEPENDENT_AMBULATORY_CARE_PROVIDER_SITE_OTHER): Payer: No Typology Code available for payment source | Admitting: Emergency Medicine

## 2013-04-24 ENCOUNTER — Ambulatory Visit: Payer: No Typology Code available for payment source

## 2013-04-24 VITALS — BP 160/90 | HR 86 | Temp 97.8°F | Resp 18 | Ht 63.0 in | Wt 209.0 lb

## 2013-04-24 DIAGNOSIS — M79672 Pain in left foot: Secondary | ICD-10-CM

## 2013-04-24 DIAGNOSIS — M79609 Pain in unspecified limb: Secondary | ICD-10-CM

## 2013-04-24 IMAGING — CR DG FOOT COMPLETE 3+V*L*
2 series · 2 of 2 positions shown · non-contrast
Comparison: None

CLINICAL DATA: Left foot pain.

LEFT FOOT - COMPLETE 3+ VIEW

[AP]
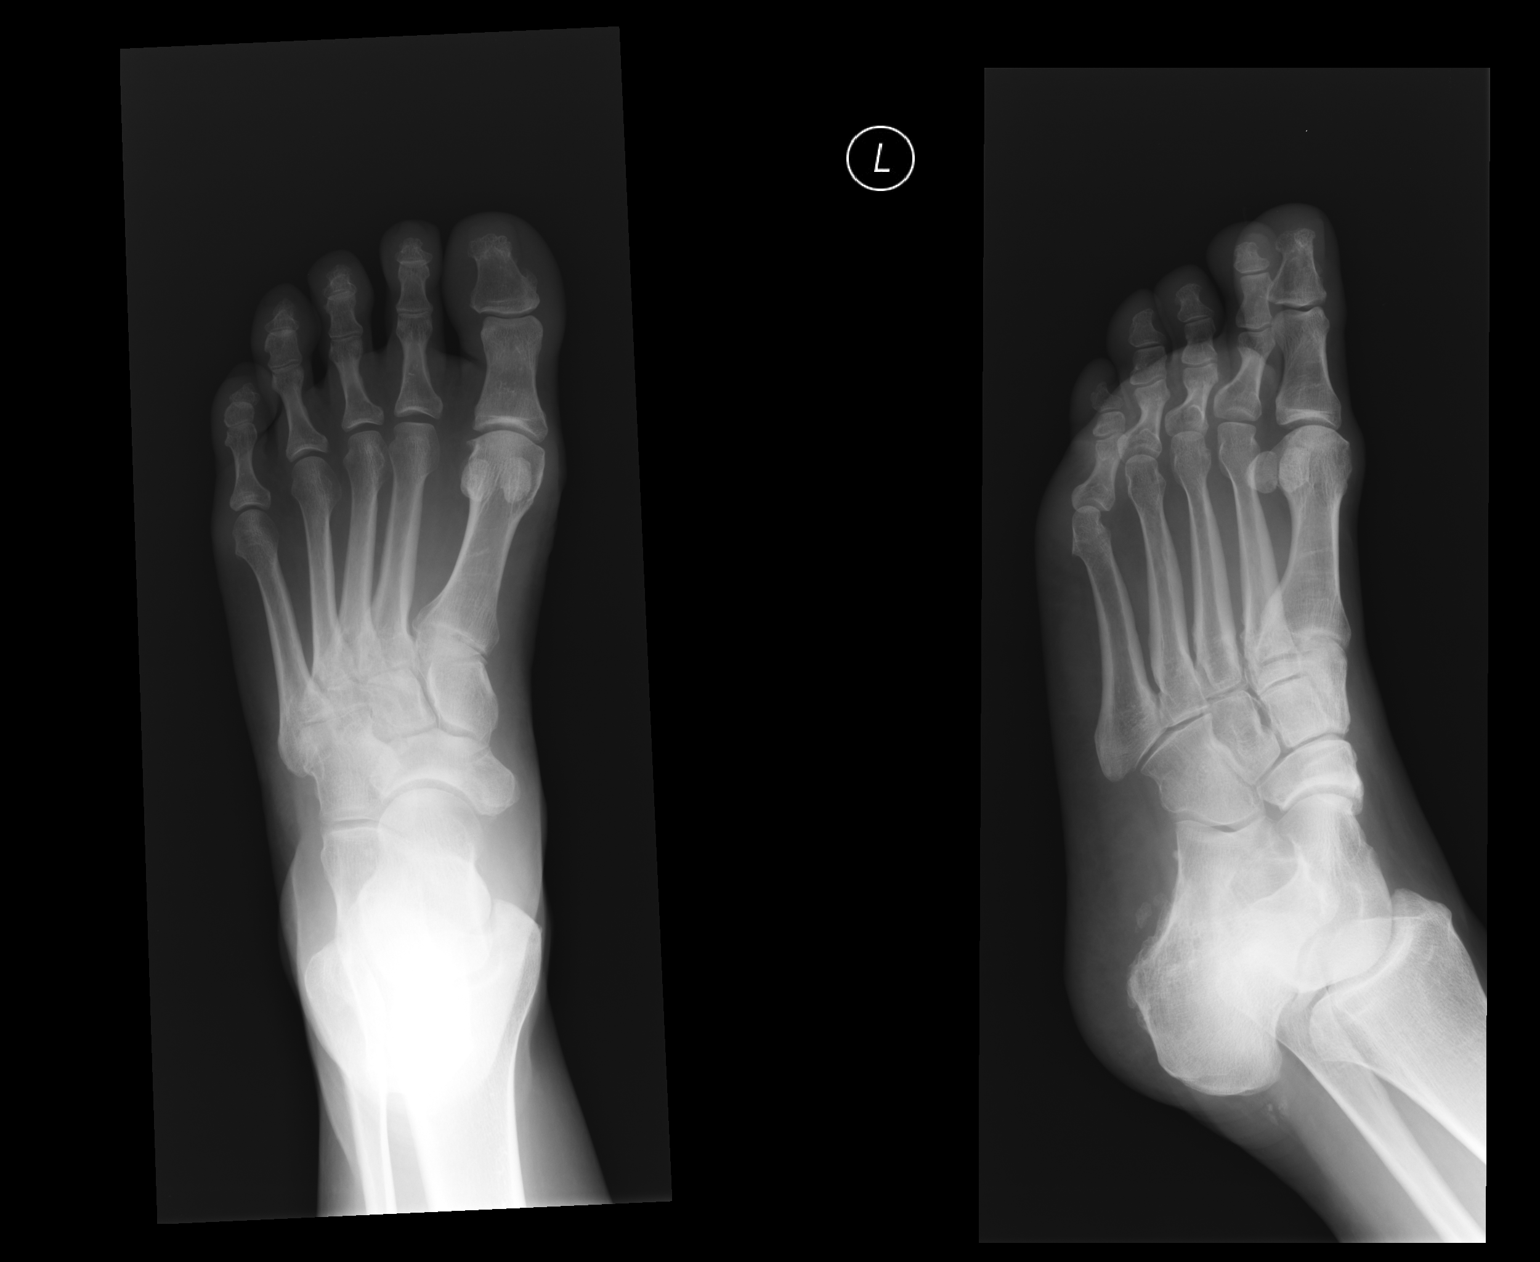

[lateral]
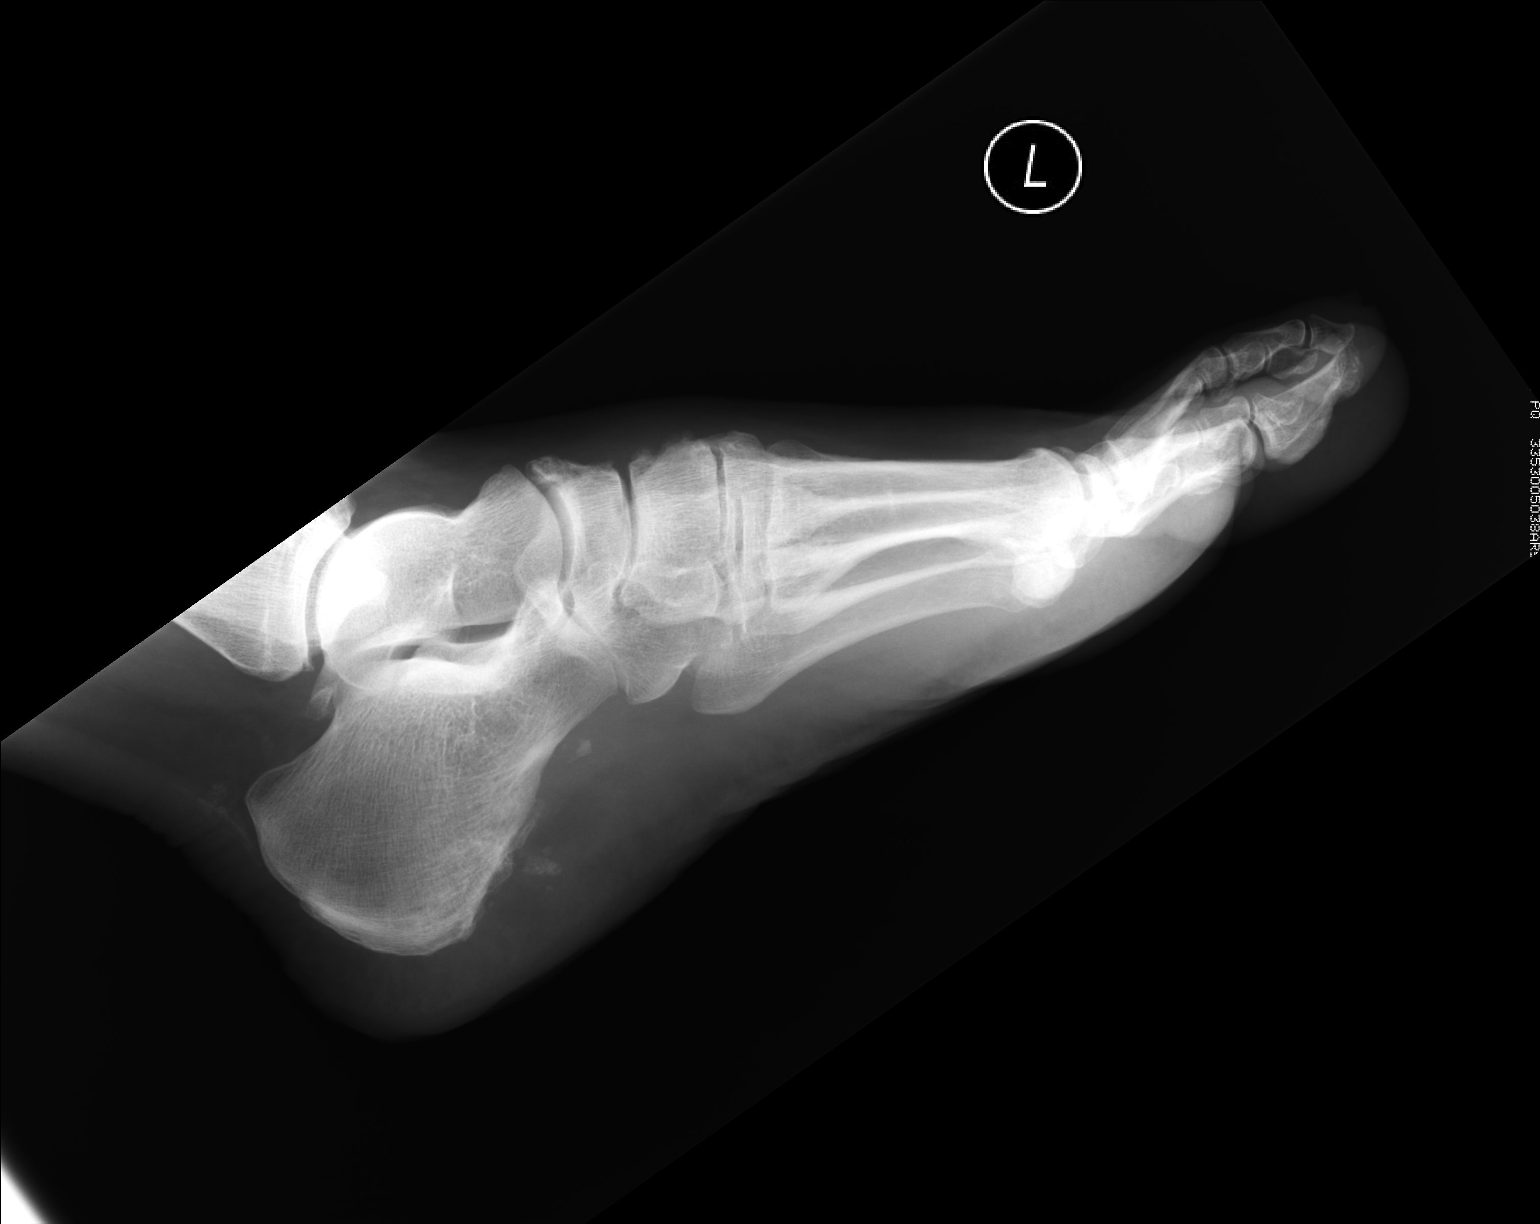

[2 of 2 positions shown; findings below may reference images not displayed]

FINDINGS: The joint spaces are maintained.  Mild degenerative
changes.  No acute fracture.  Plantar soft tissue calcifications
are noted and may be related to previous trauma.
IMPRESSION: Midfoot degenerative changes but no acute bony findings.

Clinically significant discrepancy from primary report, if
provided: None

## 2013-04-24 MED ORDER — MELOXICAM 15 MG PO TABS: 15.0000 mg | ORAL_TABLET | Freq: Every day | ORAL | Status: DC

## 2013-04-24 NOTE — Progress Notes (Signed)
[facility] [address], [place] [address] [phone]  Date:  04/24/2013   Name:  Haley White   DOB:  [birth year]   MRN:  [id number]  PCP:  Nadean Corwin, MD    Chief Complaint: Foot Pain   History of Present Illness:  Haley White is a 57 y.o. very pleasant female patient who presents with the following:  No acute injury but has pain in left foot that has continued for months and now associated with swelling and pain radiating up her leg into the shin. No ecchymosis, deformity, fever or chills.   She had surgery on her foot some time in the past.  No improvement with over the counter medications or other home remedies. Denies other complaint or health concern today.   There are no active problems to display for this patient.   Past Medical History  Diagnosis Date  . Depression   . Asthma   . Cataract     Past Surgical History  Procedure Laterality Date  . Knee surgery    . Foot surgery    . Appendectomy    . Eye surgery    . Cesarean section      X3  . Tubal ligation      History  Substance Use Topics  . Smoking status: Former Games developer  . Smokeless tobacco: Not on file  . Alcohol Use: Yes    Family History  Problem Relation Age of Onset  . Dementia Father   . Anxiety disorder Sister     No Known Allergies  Medication list has been reviewed and updated.  Current Outpatient Prescriptions on File Prior to Visit  Medication Sig Dispense Refill  . Ascorbic Acid (VITAMIN C PO) Take 1 tablet by mouth daily.      . Calcium Carbonate-Vitamin D (CALCIUM + D PO) Take 1 tablet by mouth daily.      Marland Kitchen FLUoxetine (PROZAC) 20 MG tablet Take 20 mg by mouth daily.      . methylphenidate (RITALIN) 20 MG tablet Take 20 mg by mouth 2 (two) times daily.      Marland Kitchen albuterol (PROVENTIL HFA;VENTOLIN HFA) 108 (90 BASE) MCG/ACT inhaler Inhale 2 puffs into the lungs every 6 (six) hours as needed. wheezing      . mometasone (NASONEX) 50 MCG/ACT nasal  spray Place 2 sprays into the nose daily as needed. Nasal congestion       No current facility-administered medications on file prior to visit.    Review of Systems:  As per HPI, otherwise negative.    Physical Examination: Filed Vitals:   04/24/13 1551  BP: 160/90  Pulse: 86  Temp: 97.8 F (36.6 C)  Resp: 18   Filed Vitals:   04/24/13 1551  Height: 5\' 3"  (1.6 m)  Weight: 209 lb (94.802 kg)   Body mass index is 37.03 kg/(m^2). Ideal Body Weight: Weight in (lb) to have BMI = 25: 140.8   GEN: WDWN, NAD, Non-toxic, Alert & Oriented x 3 HEENT: Atraumatic, Normocephalic.  Ears and Nose: No external deformity. EXTR: No clubbing/cyanosis/edema NEURO: Normal gait.  PSYCH: Normally interactive. Conversant. Not depressed or anxious appearing.  Calm demeanor.  LEFT foot:  Tender swollen dorsal left mid foot.  No deformity or ecchymosis.  No cellulitis.    Assessment and Plan: Foot pain mobic Follow up with podiatrist  Signed,  Phillips Odor, MD   [facility] reading (PRIMARY) by  Dr. Dareen Piano.  negative.

## 2013-07-24 IMAGING — CR DG HIP (WITH OR WITHOUT PELVIS) 2-3V*L*
2 series · 2 of 2 positions shown · non-contrast
Comparison: None.

CLINICAL DATA: Left groin pain since yesterday.

EXAM:
LEFT HIP - COMPLETE 2+ VIEW

[view not recorded (1 of 2)]
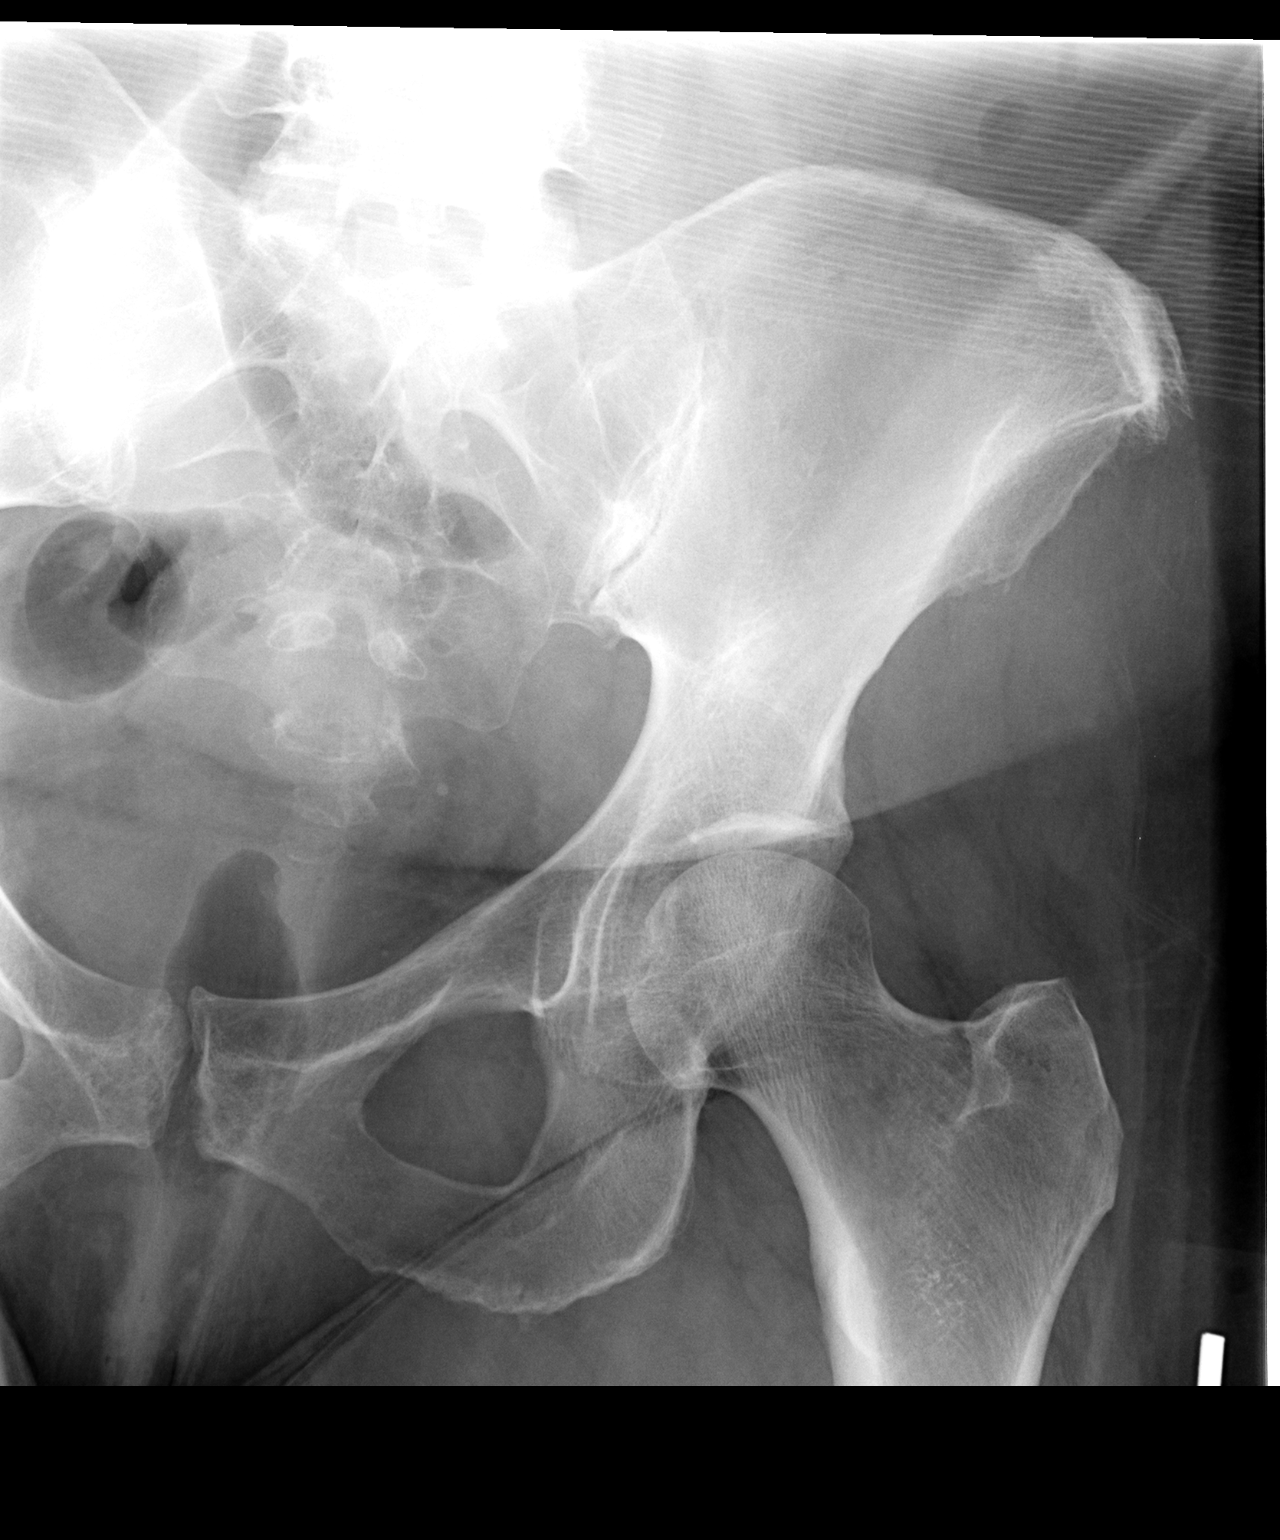

[view not recorded (2 of 2)]
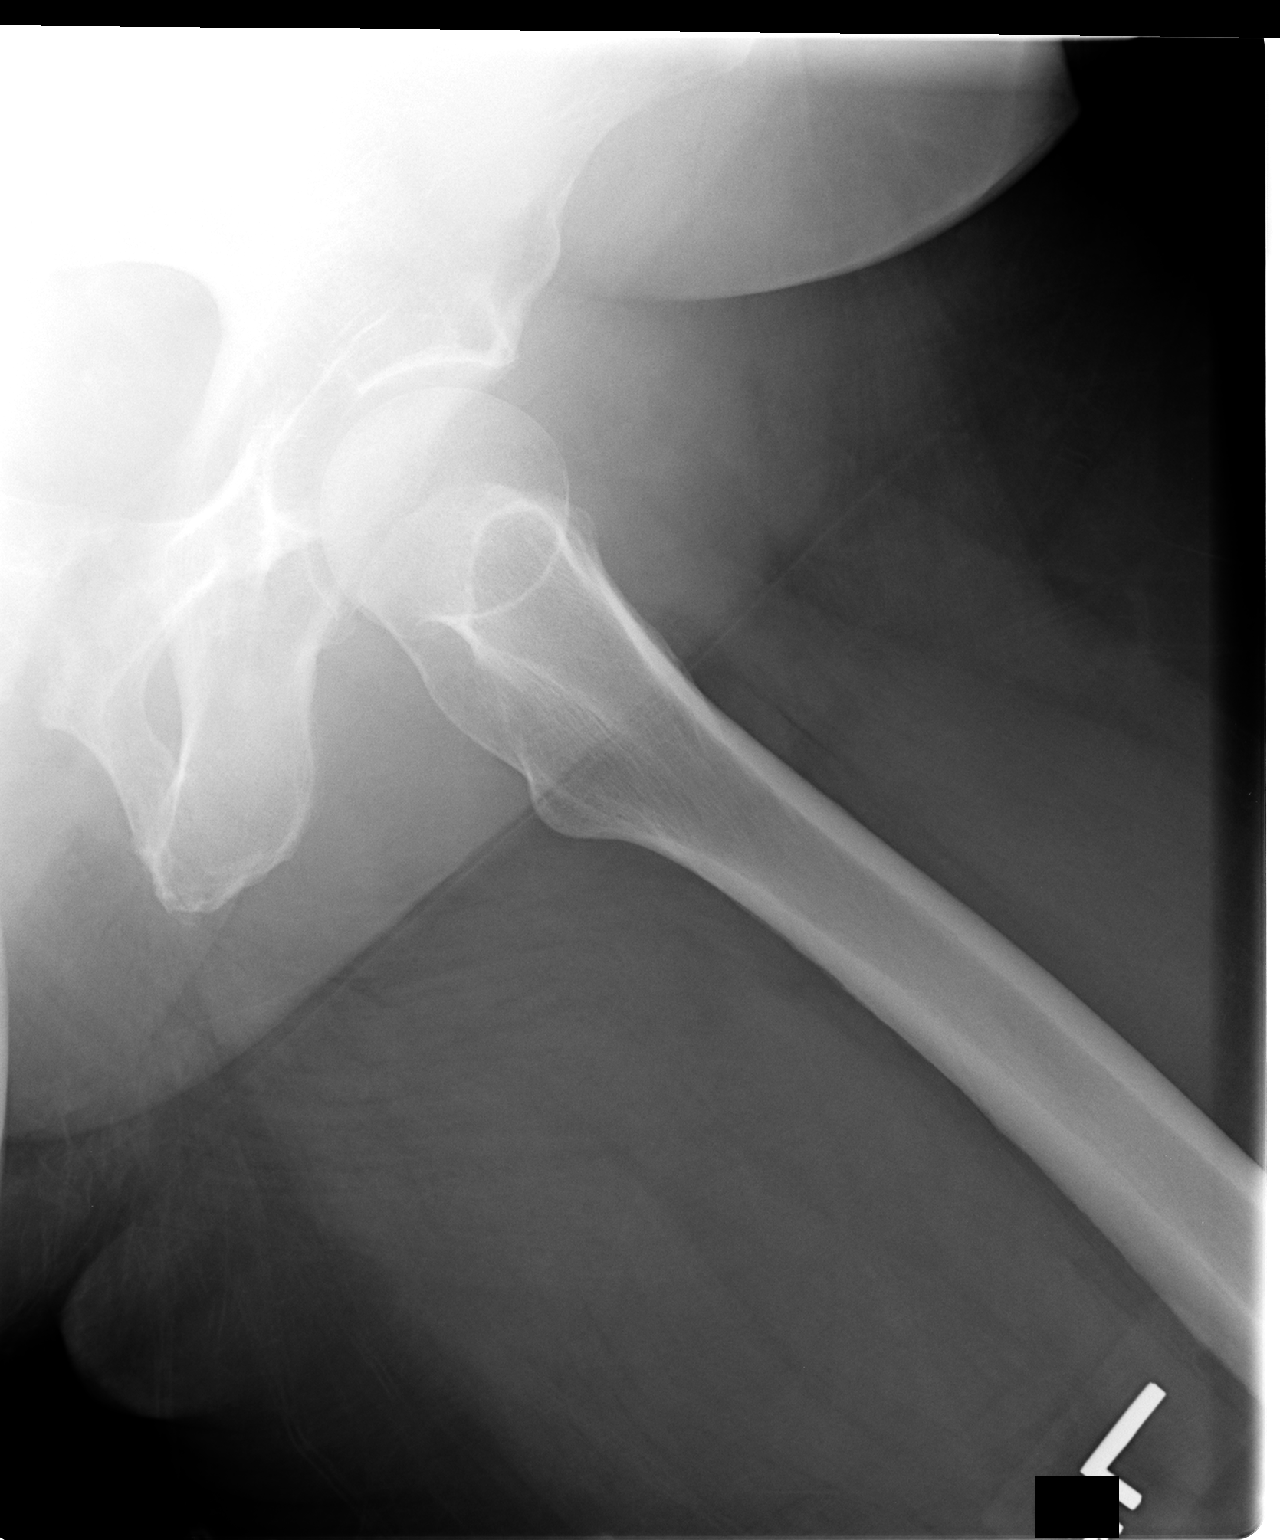

[2 of 2 positions shown; findings below may reference images not displayed]

FINDINGS: The left hip demonstrates no fracture or dislocation. The joint
space is maintained.
IMPRESSION: No acute osseous injury of the left hip.

## 2013-08-13 ENCOUNTER — Other Ambulatory Visit: Payer: Self-pay | Admitting: Internal Medicine

## 2013-08-13 ENCOUNTER — Other Ambulatory Visit (HOSPITAL_COMMUNITY)
Admission: RE | Admit: 2013-08-13 | Discharge: 2013-08-13 | Disposition: A | Discharge status: Home / Self Care | Payer: No Typology Code available for payment source | Source: Ambulatory Visit | Attending: Internal Medicine | Admitting: Internal Medicine

## 2013-08-13 DIAGNOSIS — Z1151 Encounter for screening for human papillomavirus (HPV): Secondary | ICD-10-CM | POA: Insufficient documentation

## 2013-08-13 DIAGNOSIS — Z01419 Encounter for gynecological examination (general) (routine) without abnormal findings: Secondary | ICD-10-CM | POA: Insufficient documentation

## 2013-08-17 ENCOUNTER — Encounter: Payer: Self-pay | Admitting: Internal Medicine

## 2013-09-08 ENCOUNTER — Encounter: Payer: Self-pay | Admitting: Emergency Medicine

## 2013-09-08 ENCOUNTER — Other Ambulatory Visit: Payer: Self-pay | Admitting: Emergency Medicine

## 2013-09-08 ENCOUNTER — Ambulatory Visit (HOSPITAL_COMMUNITY)
Admission: RE | Admit: 2013-09-08 | Discharge: 2013-09-08 | Disposition: A | Discharge status: Home / Self Care | Payer: No Typology Code available for payment source | Source: Ambulatory Visit | Attending: Emergency Medicine | Admitting: Emergency Medicine

## 2013-09-08 ENCOUNTER — Ambulatory Visit: Payer: No Typology Code available for payment source | Admitting: Emergency Medicine

## 2013-09-08 ENCOUNTER — Telehealth: Payer: Self-pay | Admitting: *Deleted

## 2013-09-08 VITALS — BP 116/70 | HR 74 | Temp 98.2°F | Resp 18

## 2013-09-08 DIAGNOSIS — F419 Anxiety disorder, unspecified: Secondary | ICD-10-CM

## 2013-09-08 DIAGNOSIS — M25552 Pain in left hip: Secondary | ICD-10-CM

## 2013-09-08 DIAGNOSIS — E669 Obesity, unspecified: Secondary | ICD-10-CM

## 2013-09-08 DIAGNOSIS — E119 Type 2 diabetes mellitus without complications: Secondary | ICD-10-CM

## 2013-09-08 DIAGNOSIS — R109 Unspecified abdominal pain: Secondary | ICD-10-CM | POA: Insufficient documentation

## 2013-09-08 DIAGNOSIS — I10 Essential (primary) hypertension: Secondary | ICD-10-CM

## 2013-09-08 MED ORDER — HYDROCODONE-ACETAMINOPHEN 5-325 MG PO TABS: 1.0000 | ORAL_TABLET | Freq: Four times a day (QID) | ORAL | Status: DC | PRN

## 2013-09-08 MED ORDER — CYCLOBENZAPRINE HCL 10 MG PO TABS: 10.0000 mg | ORAL_TABLET | Freq: Three times a day (TID) | ORAL | Status: DC | PRN

## 2013-09-08 NOTE — Patient Instructions (Signed)
Hip Pain  The hips join the upper legs to the lower pelvis. The bones, cartilage, tendons, and muscles of the hip joint perform a lot of work each day holding your body weight and allowing you to move around.  Hip pain is a common symptom. It can range from a minor ache to severe pain on 1 or both hips. Pain may be felt on the inside of the hip joint near the groin, or the outside near the buttocks and upper thigh. There may be swelling or stiffness as well. It occurs more often when a person walks or performs activity. There are many reasons hip pain can develop.  CAUSES   It is important to work with your caregiver to identify the cause since many conditions can impact the bones, cartilage, muscles, and tendons of the hips. Causes for hip pain include:  · Broken (fractured) bones.  · Separation of the thighbone from the hip socket (dislocation).  · Torn cartilage of the hip joint.  · Swelling (inflammation) of a tendon (tendonitis), the sac within the hip joint (bursitis), or a joint.  · A weakening in the abdominal wall (hernia), affecting the nerves to the hip.  · Arthritis in the hip joint or lining of the hip joint.  · Pinched nerves in the back, hip, or upper thigh.  · A bulging disc in the spine (herniated disc).  · Rarely, bone infection or cancer.  DIAGNOSIS   The location of your hip pain will help your caregiver understand what may be causing the pain. A diagnosis is based on your medical history, your symptoms, results from your physical exam, and results from diagnostic tests. Diagnostic tests may include X-ray exams, a computerized magnetic scan (magnetic resonance imaging, MRI), or bone scan.  TREATMENT   Treatment will depend on the cause of your hip pain. Treatment may include:  · Limiting activities and resting until symptoms improve.  · Crutches or other walking supports (a cane or brace).  · Ice, elevation, and compression.  · Physical therapy or home exercises.  · Shoe inserts or special  shoes.  · Losing weight.  · Medications to reduce pain.  · Undergoing surgery.  HOME CARE INSTRUCTIONS   · Only take over-the-counter or prescription medicines for pain, discomfort, or fever as directed by your caregiver.  · Put ice on the injured area:  · Put ice in a plastic bag.  · Place a towel between your skin and the bag.  · Leave the ice on for 15-20 minutes at a time, 3-4 times a day.  · Keep your leg raised (elevated) when possible to lessen swelling.  · Avoid activities that cause pain.  · Follow specific exercises as directed by your caregiver.  · Sleep with a pillow between your legs on your most comfortable side.  · Record how often you have hip pain, the location of the pain, and what it feels like. This information may be helpful to you and your caregiver.  · Ask your caregiver about returning to work or sports and whether you should drive.  · Follow up with your caregiver for further exams, therapy, or testing as directed.  SEEK MEDICAL CARE IF:   · Your pain or swelling continues or worsens after 1 week.  · You are feeling unwell or have chills.  · You have increasing difficulty with walking.  · You have a loss of sensation or other new symptoms.  · You have questions or concerns.  SEEK   IMMEDIATE MEDICAL CARE IF:   · You cannot put weight on the affected hip.  · You have fallen.  · You have a sudden increase in pain and swelling in your hip.  · You have a fever.  MAKE SURE YOU:   · Understand these instructions.  · Will watch your condition.  · Will get help right away if you are not doing well or get worse.  Document Released: 04/10/2010 Document Revised: 01/13/2012 Document Reviewed: 04/10/2010  [employer]® Patient Information ©2014 [employer].

## 2013-09-08 NOTE — Telephone Encounter (Signed)
Advised pt of Xray results per Loree Fee, PA-C.  Pt aware results negative and pt to to go ER if pain or symptoms increase in severity.

## 2013-09-08 NOTE — Telephone Encounter (Signed)
Message copied by Nicholaus Corolla A on Wed Sep 08, 2013  6:07 PM ------      Message from: [place], [place] R      Created: Wed Sep 08, 2013  5:47 PM       Please inform patient xray neg, please call after 3 days of rest if symptoms continue or if increase pain ER ------

## 2013-09-09 ENCOUNTER — Other Ambulatory Visit: Payer: Self-pay

## 2013-09-09 ENCOUNTER — Encounter: Payer: Self-pay | Admitting: Emergency Medicine

## 2013-09-09 ENCOUNTER — Encounter (HOSPITAL_COMMUNITY): Payer: Self-pay | Admitting: Emergency Medicine

## 2013-09-09 ENCOUNTER — Emergency Department (HOSPITAL_COMMUNITY)
Admission: EM | Admit: 2013-09-09 | Discharge: 2013-09-09 | Disposition: A | Discharge status: Home / Self Care | Payer: No Typology Code available for payment source | Attending: Emergency Medicine | Admitting: Emergency Medicine

## 2013-09-09 DIAGNOSIS — F411 Generalized anxiety disorder: Secondary | ICD-10-CM | POA: Insufficient documentation

## 2013-09-09 DIAGNOSIS — Z79899 Other long term (current) drug therapy: Secondary | ICD-10-CM | POA: Insufficient documentation

## 2013-09-09 DIAGNOSIS — F329 Major depressive disorder, single episode, unspecified: Secondary | ICD-10-CM | POA: Insufficient documentation

## 2013-09-09 DIAGNOSIS — E119 Type 2 diabetes mellitus without complications: Secondary | ICD-10-CM | POA: Insufficient documentation

## 2013-09-09 DIAGNOSIS — R7309 Other abnormal glucose: Secondary | ICD-10-CM | POA: Insufficient documentation

## 2013-09-09 DIAGNOSIS — E669 Obesity, unspecified: Secondary | ICD-10-CM | POA: Insufficient documentation

## 2013-09-09 DIAGNOSIS — F3289 Other specified depressive episodes: Secondary | ICD-10-CM | POA: Insufficient documentation

## 2013-09-09 DIAGNOSIS — R109 Unspecified abdominal pain: Secondary | ICD-10-CM | POA: Insufficient documentation

## 2013-09-09 DIAGNOSIS — Z8669 Personal history of other diseases of the nervous system and sense organs: Secondary | ICD-10-CM | POA: Insufficient documentation

## 2013-09-09 DIAGNOSIS — Z9851 Tubal ligation status: Secondary | ICD-10-CM | POA: Insufficient documentation

## 2013-09-09 DIAGNOSIS — F419 Anxiety disorder, unspecified: Secondary | ICD-10-CM | POA: Insufficient documentation

## 2013-09-09 DIAGNOSIS — I10 Essential (primary) hypertension: Secondary | ICD-10-CM | POA: Insufficient documentation

## 2013-09-09 DIAGNOSIS — J45909 Unspecified asthma, uncomplicated: Secondary | ICD-10-CM | POA: Insufficient documentation

## 2013-09-09 DIAGNOSIS — R1032 Left lower quadrant pain: Secondary | ICD-10-CM

## 2013-09-09 DIAGNOSIS — Z87891 Personal history of nicotine dependence: Secondary | ICD-10-CM | POA: Insufficient documentation

## 2013-09-09 DIAGNOSIS — Z9089 Acquired absence of other organs: Secondary | ICD-10-CM | POA: Insufficient documentation

## 2013-09-09 LAB — URINALYSIS, ROUTINE W REFLEX MICROSCOPIC
Glucose, UA: NEGATIVE mg/dL
Hgb urine dipstick: NEGATIVE
Leukocytes, UA: NEGATIVE
Nitrite: NEGATIVE
Protein, ur: NEGATIVE mg/dL
pH: 5.5 (ref 5.0–8.0)

## 2013-09-09 MED ORDER — HYDROMORPHONE HCL PF 1 MG/ML IJ SOLN
1.0000 mg | Freq: Once | INTRAMUSCULAR | Status: AC
  Administered 2013-09-09: 1 mg via INTRAMUSCULAR
  Filled 2013-09-09: qty 1

## 2013-09-09 NOTE — ED Provider Notes (Signed)
Medical screening examination/treatment/procedure(s) were performed by non-physician practitioner and as supervising physician I was immediately available for consultation/collaboration.  EKG Interpretation   None         Junius Argyle, MD 09/09/13 1524

## 2013-09-09 NOTE — ED Notes (Signed)
Pt reports left sided groin pain x3 days, hx of sciatica. Went to pcp this week and had normal xray. But pain has increased, 10/10 at present. 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 Pt ambulates with pain and much assistance.

## 2013-09-09 NOTE — Progress Notes (Signed)
  Subjective:    Patient ID: Haley White, female    DOB: 03/22/1956, 57 y.o.   MRN: [id number]  HPI Comments: 57 yo obese female presents with groin pain x couple of days. Denies fall or trauma. Admits to increased getting in and out of a chair over last couple of days at work. No relief with heat/ ice/ Ibuprofen, tylenol, or old NORCO. Movement and bearing weight increase symptoms. NO CP SOB HA DIZZINESS  Groin Pain  Back Pain    Current Outpatient Prescriptions on File Prior to Visit  Medication Sig Dispense Refill  . albuterol (PROVENTIL HFA;VENTOLIN HFA) 108 (90 BASE) MCG/ACT inhaler Inhale 2 puffs into the lungs every 6 (six) hours as needed. wheezing      . Ascorbic Acid (VITAMIN C PO) Take 1 tablet by mouth daily.      . Calcium Carbonate-Vitamin D (CALCIUM + D PO) Take 1 tablet by mouth daily.      Marland Kitchen FLUoxetine (PROZAC) 20 MG tablet Take 20 mg by mouth daily.      . meloxicam (MOBIC) 15 MG tablet Take 1 tablet (15 mg total) by mouth daily.  30 tablet  1  . methylphenidate (RITALIN) 20 MG tablet Take 20 mg by mouth 2 (two) times daily.      . mometasone (NASONEX) 50 MCG/ACT nasal spray Place 2 sprays into the nose daily as needed. Nasal congestion       No current facility-administered medications on file prior to visit.   Review of patient's allergies indicates no known allergies. Past Medical History  Diagnosis Date  . Depression   . Asthma   . Cataract   . Hypertension   . Anxiety   . 250.00      Review of Systems  Constitutional: Negative.   Respiratory: Negative.   Cardiovascular: Negative.   Musculoskeletal: Positive for arthralgias, gait problem and myalgias.  Neurological: Negative.   Psychiatric/Behavioral: Negative.       BP 116/70  Pulse 74  Temp(Src) 98.2 F (36.8 C) (Temporal)  Resp 18  Objective:   Physical Exam  Nursing note and vitals reviewed. Constitutional: She appears well-developed and well-nourished.  Neck: Normal range of motion.   Cardiovascular: Normal rate, regular rhythm, normal heart sounds and intact distal pulses.   Pulmonary/Chest: Effort normal and breath sounds normal.  Abdominal: Soft.  Musculoskeletal:  Left hip pain with ROM in all directions, patient in wheel chair and appears uncomfortable.  Neurological: She is alert. She has normal reflexes.  Skin: Skin is warm and dry.  Psychiatric: Judgment normal.          Assessment & Plan:  Left hip pain/ strain get Xray. R.I.C.E x 3 days off work. Flexeril 10 mg AD, Norco 5/325 AD. ER if Symptoms increase.

## 2013-09-09 NOTE — ED Notes (Signed)
Pt made aware of need for UA 

## 2013-09-09 NOTE — ED Provider Notes (Signed)
CSN: [id number]     Arrival date & time 09/09/13  1005 History   First MD Initiated Contact with Patient 09/09/13 1013     Chief Complaint  Patient presents with  . left side groin pain    (Consider location/radiation/quality/duration/timing/severity/associated sxs/prior Treatment) The history is provided by the patient and medical records.   This is a 57 year old female with past medical history significant for depression, asthma, hypertension, anxiety, presenting to the ED for left groin pain for the past 3 days. Pain described as a "pulling" sensation with some radiation down her left anterior leg.  No recent injury, trauma, or falls. Patient was seen by her primary care physician yesterday and had x-ray performed which was negative for acute findings. Patient states pain has increased since then, now rated 10/10.  Pain worse with direct palpation, weightbearing, and ambulation. 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  States she has been getting up and down out of chairs more than usual over the past several days.  She denies any abdominal pain, urinary symptoms, flank pain, or vaginal sx.  Pt has taken vicodin PTA without improvement.  Past Medical History  Diagnosis Date  . Depression   . Asthma   . Cataract   . Hypertension   . Anxiety   . 250.00   . Obese    Past Surgical History  Procedure Laterality Date  . Knee surgery    . Foot surgery    . Appendectomy    . Eye surgery    . Cesarean section      X3  . Tubal ligation     Family History  Problem Relation Age of Onset  . Dementia Father   . Anxiety disorder Sister    History  Substance Use Topics  . Smoking status: Former Games developer  . Smokeless tobacco: Not on file  . Alcohol Use: Yes   OB History   Grav Para Term Preterm Abortions TAB SAB Ect Mult Living                 Review of Systems  Genitourinary:       Groin pain  All other systems  reviewed and are negative.    Allergies  Review of patient's allergies indicates no known allergies.  Home Medications   Current Outpatient Rx  Name  Route  Sig  Dispense  Refill  . albuterol (PROVENTIL HFA;VENTOLIN HFA) 108 (90 BASE) MCG/ACT inhaler   Inhalation   Inhale 2 puffs into the lungs every 6 (six) hours as needed. wheezing         . Ascorbic Acid (VITAMIN C PO)   Oral   Take 1 tablet by mouth daily.         . Calcium Carbonate-Vitamin D (CALCIUM + D PO)   Oral   Take 1 tablet by mouth daily.         . cyclobenzaprine (FLEXERIL) 10 MG tablet   Oral   Take 1 tablet (10 mg total) by mouth 3 (three) times daily as needed for muscle spasms.   30 tablet   0   . FLUoxetine (PROZAC) 20 MG tablet   Oral   Take 20 mg by mouth daily.         Marland Kitchen HYDROcodone-acetaminophen (NORCO) 5-325 MG per tablet   Oral   Take 1 tablet by mouth every 6 (six) hours as needed for moderate pain.  30 tablet   0   . meloxicam (MOBIC) 15 MG tablet   Oral   Take 1 tablet (15 mg total) by mouth daily.   30 tablet   1   . methylphenidate (RITALIN) 20 MG tablet   Oral   Take 20 mg by mouth 2 (two) times daily.         . mometasone (NASONEX) 50 MCG/ACT nasal spray   Nasal   Place 2 sprays into the nose daily as needed. Nasal congestion          BP 141/76  Pulse 73  Temp(Src) 98.6 F (37 C) (Oral)  Resp 18  SpO2 97%  Physical Exam  Nursing note and vitals reviewed. Constitutional: She is oriented to person, place, and time. She appears well-developed and well-nourished.  HENT:  Head: Normocephalic and atraumatic.  Mouth/Throat: Oropharynx is clear and moist.  Eyes: Conjunctivae and EOM are normal. Pupils are equal, round, and reactive to light.  Neck: Normal range of motion.  Cardiovascular: Normal rate, regular rhythm and normal heart sounds.   Pulmonary/Chest: Effort normal and breath sounds normal.  Abdominal: Soft. Bowel sounds are normal. There is no  tenderness. There is no guarding. No hernia. Hernia confirmed negative in the left inguinal area.    TTP left groin; no deformity, rashes, lesions, erythema, bruising, abrasion, laceration, or other signs of trauma; no hernia felt; full ROM left hip with mild pain; strong distal pulse and cap refill; sensation intact; able to ambulate with some assistance  Musculoskeletal: Normal range of motion.       Left hip: She exhibits normal range of motion, normal strength, no swelling and no deformity.  Neurological: She is alert and oriented to person, place, and time.  Skin: Skin is warm and dry.  Psychiatric: She has a normal mood and affect.    ED Course  Procedures (including critical care time) Labs Review Labs Reviewed - No data to display Imaging Review Dg Hip Complete Left  09/08/2013   CLINICAL DATA:  Left groin pain since yesterday.  EXAM: LEFT HIP - COMPLETE 2+ VIEW  COMPARISON:  None.  FINDINGS: The left hip demonstrates no fracture or dislocation. The joint space is maintained.  IMPRESSION: No acute osseous injury of the left hip.   Electronically Signed   By: Elige Ko   On: 09/08/2013 12:37    EKG Interpretation   None       MDM   1. Left groin pain    Atraumatic, left groin pain with negative hip imaging, worse with weight bearing and ambulation.  No hernia felt.  Will obtain u/a.  Dilaudid IM given.  Will reassess.  12:47 PM Patient's pain has improved, she is now able to lay flat on her back which she has not been able to do for several days. Has ambulated to the bathroom with minimal assistance.  She states she feel pain at a tolerable level she can manage it at home. Given referral for orthopedics followup if no improvement in next few days.  Given list of home PT exercises she may try to help with her pain.  Continue taking vicodin and flexeril as directed.  Discussed plan with pt, she agreed.  Return precautions advised.  Garlon Hatchet, PA-C 09/09/13 1315

## 2013-09-29 ENCOUNTER — Encounter: Payer: No Typology Code available for payment source | Admitting: Internal Medicine

## 2013-11-02 ENCOUNTER — Ambulatory Visit (AMBULATORY_SURGERY_CENTER): Payer: Self-pay | Admitting: *Deleted

## 2013-11-02 VITALS — Ht 64.0 in | Wt 209.8 lb

## 2013-11-02 DIAGNOSIS — Z1211 Encounter for screening for malignant neoplasm of colon: Secondary | ICD-10-CM

## 2013-11-02 MED ORDER — MOVIPREP 100 G PO SOLR: ORAL | Status: DC

## 2013-11-02 NOTE — Progress Notes (Signed)
No allergies to eggs or soy. No problems with anesthesia.  

## 2013-11-16 ENCOUNTER — Encounter: Payer: Self-pay | Admitting: Internal Medicine

## 2013-11-16 ENCOUNTER — Ambulatory Visit (AMBULATORY_SURGERY_CENTER): Payer: No Typology Code available for payment source | Admitting: Internal Medicine

## 2013-11-16 VITALS — BP 147/86 | HR 72 | Temp 98.3°F | Resp 22 | Ht 64.0 in | Wt 209.0 lb

## 2013-11-16 DIAGNOSIS — D126 Benign neoplasm of colon, unspecified: Secondary | ICD-10-CM

## 2013-11-16 DIAGNOSIS — Z1211 Encounter for screening for malignant neoplasm of colon: Secondary | ICD-10-CM

## 2013-11-16 HISTORY — PX: COLONOSCOPY: SHX174

## 2013-11-16 MED ORDER — SODIUM CHLORIDE 0.9 % IV SOLN: 500.0000 mL | INTRAVENOUS | Status: DC

## 2013-11-16 NOTE — Op Note (Signed)
[place]  [address] [place], [id number]   COLONOSCOPY PROCEDURE REPORT  PATIENT: [name], [name]  MR#: [id number] BIRTHDATE: [birth year] , 57  yrs. old GENDER: Female ENDOSCOPIST: Lafayette Dragon, MD REFERRED RS:WNIOEVO Melford Aase, M.D. PROCEDURE DATE:  11/16/2013 PROCEDURE:   Colonoscopy with cold biopsy polypectomy First Screening Colonoscopy - Avg.  risk and is 50 yrs.  old or older Yes.  Prior Negative Screening - Now for repeat screening. N/A  History of Adenoma - Now for follow-up colonoscopy & has been > or = to 3 yrs.  N/A  Polyps Removed Today? Yes. ASA CLASS:   Class II INDICATIONS:Average risk patient for colon cancer. MEDICATIONS: MAC sedation, administered by CRNA and Propofol (Diprivan) 360 mg IV  DESCRIPTION OF PROCEDURE:   After the risks benefits and alternatives of the procedure were thoroughly explained, informed consent was obtained.  A digital rectal exam revealed no abnormalities of the rectum.   The LB [id number]  endoscope was introduced through the anus and advanced to the cecum, which was identified by both the appendix and ileocecal valve. No adverse events experienced.   The quality of the prep was good, using MoviPrep  The instrument was then slowly withdrawn as the colon was fully examined.      COLON FINDINGS: Two smooth sessile polyps ranging between 3-54mm in size were found in the descending colon.  A polypectomy was performed with cold forceps.  The resection was complete and the polyp tissue was completely retrieved.  Retroflexed views revealed no abnormalities. The time to cecum=9 minutes 07 seconds. Withdrawal time=11 minutes 30 seconds.  The scope was withdrawn and the procedure completed. COMPLICATIONS: There were no complications.  ENDOSCOPIC IMPRESSION: Two sessile polyps ranging between 3-45mm in size were found in the descending colon; polypectomy was performed with cold forceps  RECOMMENDATIONS: 1.   Await biopsy results 2.  high fiber diet recall colonoscopy pending biopsies   eSigned:  Lafayette Dragon, MD 11/16/2013 12:44 PM   cc:   PATIENT NAME:  [name], Haley MR#: [id number]

## 2013-11-16 NOTE — Progress Notes (Signed)
Called to room to assist during endoscopic procedure.  Patient ID and intended procedure confirmed with present staff. Received instructions for my participation in the procedure from the performing physician.  

## 2013-11-16 NOTE — Progress Notes (Signed)
Patient did not experience any of the following events: a burn prior to discharge; a fall within the facility; wrong site/side/patient/procedure/implant event; or a hospital transfer or hospital admission upon discharge from the facility. (G8907)Patient did not have preoperative order for IV antibiotic SSI prophylaxis. (G8918) ewm 

## 2013-11-16 NOTE — Patient Instructions (Signed)
YOU HAD AN ENDOSCOPIC PROCEDURE TODAY AT [facility]: Refer to the procedure report that was given to you for any specific questions about what was found during the examination.  If the procedure report does not answer your questions, please call your gastroenterologist to clarify.  If you requested that your care partner not be given the details of your procedure findings, then the procedure report has been included in a sealed envelope for you to review at your convenience later.  YOU SHOULD EXPECT: Some feelings of bloating in the abdomen. Passage of more gas than usual.  Walking can help get rid of the air that was put into your GI tract during the procedure and reduce the bloating. If you had a lower endoscopy (such as a colonoscopy or flexible sigmoidoscopy) you may notice spotting of blood in your stool or on the toilet paper. If you underwent a bowel prep for your procedure, then you may not have a normal bowel movement for a few days.  DIET: Your first meal following the procedure should be a light meal and then it is ok to progress to your normal diet.  A half-sandwich or bowl of soup is an example of a good first meal.  Heavy or fried foods are harder to digest and may make you feel nauseous or bloated.  Likewise meals heavy in dairy and vegetables can cause extra gas to form and this can also increase the bloating.  Drink plenty of fluids but you should avoid alcoholic beverages for 24 hours.  ACTIVITY: Your care partner should take you home directly after the procedure.  You should plan to take it easy, moving slowly for the rest of the day.  You can resume normal activity the day after the procedure however you should NOT DRIVE or use heavy machinery for 24 hours (because of the sedation medicines used during the test).    SYMPTOMS TO REPORT IMMEDIATELY: A gastroenterologist can be reached at any hour.  During normal business hours, 8:30 AM to 5:00 PM Monday through Friday,  call [phone].  After hours and on weekends, please call the GI answering service at [phone]  Emergency number who will take a message and have the physician on call contact you.   Following lower endoscopy (colonoscopy or flexible sigmoidoscopy):  Excessive amounts of blood in the stool  Significant tenderness or worsening of abdominal pains  Swelling of the abdomen that is new, acute  Fever of 100F or higher  FOLLOW UP: If any biopsies were taken you will be contacted by phone or by letter within the next 1-3 weeks.  Call your gastroenterologist if you have not heard about the biopsies in 3 weeks.  Our staff will call the home number listed on your records the next business day following your procedure to check on you and address any questions or concerns that you may have at that time regarding the information given to you following your procedure. This is a courtesy call and so if there is no answer at the home number and we have not heard from you through the emergency physician on call, we will assume that you have returned to your regular daily activities without incident.  SIGNATURES/CONFIDENTIALITY: You and/or your care partner have signed paperwork which will be entered into your electronic medical record.  These signatures attest to the fact that that the information above on your After Visit Summary has been reviewed and is understood.  Full responsibility of the  confidentiality of this discharge information lies with you and/or your care-partner.  Handout on polyps and high fiber diet Repeat colonoscopy pending pathology results

## 2013-11-16 NOTE — Progress Notes (Signed)
A/ox3 pleased with MAC, report to Marie RN 

## 2013-11-17 ENCOUNTER — Telehealth: Payer: Self-pay | Admitting: *Deleted

## 2013-11-17 NOTE — Telephone Encounter (Signed)
No answer, left message to call if questions or concerns. 

## 2013-11-17 NOTE — Telephone Encounter (Signed)
Pt. States she has a "bad headache" this am and wonders if it is related to colonoscopy yesterday.  States she has not taken anything for headache relief.  She denies Abdominal pain, inability to eat, or fever.  Advised to take medication (she uses advil), eat and drink good amounts of liquid today.  Advised that headache probably not Related to procedure yesterday.

## 2013-11-19 ENCOUNTER — Ambulatory Visit (INDEPENDENT_AMBULATORY_CARE_PROVIDER_SITE_OTHER): Payer: No Typology Code available for payment source | Admitting: Physician Assistant

## 2013-11-19 ENCOUNTER — Encounter: Payer: Self-pay | Admitting: Physician Assistant

## 2013-11-19 VITALS — BP 138/78 | HR 100 | Temp 98.1°F | Resp 16 | Ht 63.0 in | Wt 207.0 lb

## 2013-11-19 DIAGNOSIS — J209 Acute bronchitis, unspecified: Secondary | ICD-10-CM

## 2013-11-19 LAB — POC INFLUENZA A&B (BINAX/QUICKVUE)

## 2013-11-19 MED ORDER — PREDNISONE 20 MG PO TABS: ORAL_TABLET | ORAL | Status: DC

## 2013-11-19 MED ORDER — PROMETHAZINE-CODEINE 6.25-10 MG/5ML PO SYRP: 5.0000 mL | ORAL_SOLUTION | Freq: Four times a day (QID) | ORAL | Status: DC | PRN

## 2013-11-19 MED ORDER — AZITHROMYCIN 500 MG PO TABS: ORAL_TABLET | ORAL | Status: DC

## 2013-11-19 MED ORDER — ALBUTEROL SULFATE HFA 108 (90 BASE) MCG/ACT IN AERS: 2.0000 | INHALATION_SPRAY | Freq: Four times a day (QID) | RESPIRATORY_TRACT | Status: DC | PRN

## 2013-11-19 NOTE — Patient Instructions (Signed)
Please take the prednisone to help decrease inflammation and therefore decrease symptoms. Take it it with food to avoid GI upset. It can cause increased energy but on the other hand it can make it hard to sleep at night so please take it in the morning.  It is not an antibiotic so you can stop it early if you are feeling better.  If you are diabetic it will increase your sugars.  The majority of colds are caused by viruses and do not require antibiotics. Please read the rest of this hand out to learn more about the common cold and what you can do to help yourself as well as help prevent the over use of antibiotics.   COMMON COLD SIGNS AND SYMPTOMS - The common cold usually causes nasal congestion, runny nose, and sneezing. A sore throat may be present on the first day but usually resolves quickly. If a cough occurs, it generally develops on about the fourth or fifth day of symptoms, typically when congestion and runny nose are resolving  COMMON COLD COMPLICATIONS - In most cases, colds do not cause serious illness or complications. Most colds last for three to seven days, although many people continue to have symptoms (coughing, sneezing, congestion) for up to two weeks.  One of the more common complications is sinusitis, which is usually caused by viruses and rarely (about 2 percent of the time) by bacteria. Having thick or yellow to green-colored nasal discharge does not mean that bacterial sinusitis has developed; discolored nasal discharge is a normal phase of the common cold.  Lower respiratory infections, such as pneumonia or bronchitis, may develop following a cold.  Infection of the middle ear, or otitis media, can accompany or follow a cold.  COMMON COLD TREATMENT - There is no specific treatment for the viruses that cause the common cold. Most treatments are aimed at relieving some of the symptoms of the cold, but do not shorten or cure the cold. Antibiotics are not useful for treating the  common cold; antibiotics are only used to treat illnesses caused by bacteria, not viruses. Unnecessary use of antibiotics for the treatment of the common cold can cause allergic reactions, diarrhea, or other gastrointestinal symptoms in some patients.  The symptoms of a cold will resolve over time, even without any treatment. People with underlying medical conditions and those who use other over-the-counter or prescription medications should speak with their healthcare provider or pharmacist to ensure that it is safe to use these treatments. The following are treatments that may reduce the symptoms caused by the common cold.  Nasal congestion - Decongestants are good for nasal congestion- if you feel very stuffy but no mucus is coming out, this is the medication that will help you the most.  Pseudoephedrine is a decongestant that can improve nasal congestion. Although a prescription is not required, drugstores in the [place] keep pseudoephedrine behind the counter, so it must be requested from a pharmacist. If you have a heart condition or high blood pressure please use Coricidin BPH instead.   Runny nose - Antihistamines such as diphenhydramine (Benadryl), certazine (Zyrtec) which are best taking at night because they can make you tired OR loratadine (Claritin),  fexafinadine (Allegra) help with a runny nose.   Nasal sprays such an oxymetazoline (Afrin and others) may also give temporary relief of nasal congestion. However, these sprays should never be used for more than two to three days; use for more than three days use can worsen congestion.  Nasocort is now over the counter and can help decrease a runny nose. Please stop the medication if you have blurry vision or nose bleeds.   Sore throat and headache - Sore throat and headache are best treated with a mild pain reliever such as acetaminophen (Tylenol) or a non-steroidal anti-inflammatory agent such as ibuprofen or naproxen (Motrin or Aleve).  These medications should be taken with food to prevent stomach problems. As well as gargling with warm water and salt.   Cough - Common cough medicine ingredients include guaifenesin and dextromethorphan; these are often combined with other medications in over-the-counter cold formulas. Often a cough is worse at night or first in the morning due to post nasal drip from you nose. You can try to sleep at an angle to decrease a cough.   Alternative treatments - Heated, humidified air can improve symptoms of nasal congestion and runny nose, and causes few to no side effects. A number of alternative products, including vitamin C, doubling up on your vitamin D and herbal products such as echinacea, may help. Certain products, such as nasal gels that contain zinc (eg, Zicam), have been associated with a permanent loss of smell.  Antibiotics - Antibiotics should not be used to treat an uncomplicated common cold. As noted above, colds are caused by viruses. Antibiotics treat bacterial, not viral infections. Some viruses that cause the common cold can also depress the immune system or cause swelling in the lining of the nose or airways; this can, in turn, lead to a bacterial infection. Often you need to give your body 7 days to fight off a common cold while treating the symptoms with the medications listed above. If after 7 days your symptoms are not improving, you are getting worse, you have shortness of breath, chest pain, a fever of over 103 you should seek medical help immediately.   PREVENTION IS THE BEST MEDICINE - Hand washing is an essential and highly effective way to prevent the spread of infection.  Alcohol-based hand rubs are a good alternative for disinfecting hands if a sink is not available.  Hands should be washed before preparing food and eating and after coughing, blowing the nose, or sneezing. While it is not always possible to limit contact with people who may be infected with a cold, touching  the eyes, nose, or mouth after direct contact should be avoided when possible. Sneezing/coughing into the sleeve of one's clothing (at the inner elbow) is another means of containing sprays of saliva and secretions and does not contaminate the hands.   How to Use an Inhaler Proper inhaler technique is very important. Good technique ensures that the medicine reaches the lungs. Poor technique results in depositing the medicine on the tongue and back of the throat rather than in the airways. If you do not use the inhaler with good technique, the medicine will not help you. STEPS TO FOLLOW IF USING AN INHALER WITHOUT AN EXTENSION TUBE 1. Remove the cap from the inhaler. 2. If you are using the inhaler for the first time, you will need to prime it. Shake the inhaler for 5 seconds and release four puffs into the air, away from your face. Ask your health care provider or pharmacist if you have questions about priming your inhaler. 3. Shake the inhaler for 5 seconds before each breath in (inhalation). 4. Position the inhaler so that the top of the canister faces up. 5. Put your index finger on the top of the medicine canister. Your  thumb supports the bottom of the inhaler. 6. Open your mouth. 7. Either place the inhaler between your teeth and place your lips tightly around the mouthpiece, or hold the inhaler 1 2 inches away from your open mouth. If you are unsure of which technique to use, ask your health care provider. 8. Breathe out (exhale) normally and as completely as possible. 9. Press the canister down with your index finger to release the medicine. 10. At the same time as the canister is pressed, inhale deeply and slowly until your lungs are completely filled. This should take 4 6 seconds. Keep your tongue down. 11. Hold the medicine in your lungs for 5 10 seconds (10 seconds is best). This helps the medicine get into the small airways of your lungs. 12. Breathe out slowly, through pursed lips.  Whistling is an example of pursed lips. 13. Wait at least 15 30 seconds between puffs. Continue with the above steps until you have taken the number of puffs your health care provider has ordered. Do not use the inhaler more than your health care provider tells you. 14. Replace the cap on the inhaler. 15. Follow the directions from your health care provider or the inhaler insert for cleaning the inhaler.

## 2013-11-19 NOTE — Progress Notes (Signed)
   Subjective:    Patient ID: Haley White, female    DOB: 1956/09/15, 58 y.o.   MRN: [id number]  Cough This is a new problem. The current episode started yesterday. The problem has been rapidly worsening. The problem occurs constantly. The cough is non-productive. Associated symptoms include chills, a fever, headaches, shortness of breath and wheezing. Pertinent negatives include no chest pain, ear congestion, ear pain, heartburn, hemoptysis, myalgias, nasal congestion, postnasal drip, rash, rhinorrhea, sore throat, sweats or weight loss. Risk factors: history of tobacco use. She has tried nothing for the symptoms. Her past medical history is significant for asthma (vs COPD).    Review of Systems  Constitutional: Positive for fever, chills and fatigue. Negative for weight loss and diaphoresis.  HENT: Negative for congestion, ear pain, postnasal drip, rhinorrhea, sinus pressure, sneezing and sore throat.   Eyes: Negative.   Respiratory: Positive for cough, chest tightness, shortness of breath and wheezing. Negative for hemoptysis.   Cardiovascular: Negative.  Negative for chest pain.  Gastrointestinal: Negative.  Negative for heartburn.  Musculoskeletal: Negative.  Negative for myalgias.  Skin: Negative for rash.  Neurological: Positive for headaches. Negative for tremors, syncope, facial asymmetry, speech difficulty, weakness, light-headedness and numbness.       Objective:   Physical Exam  Constitutional: She is oriented to person, place, and time. She appears well-developed and well-nourished.  HENT:  Head: Normocephalic and atraumatic.  Right Ear: External ear normal.  Left Ear: External ear normal.  Nose: Nose normal.  Mouth/Throat: Oropharynx is clear and moist.  Eyes: Conjunctivae are normal. Pupils are equal, round, and reactive to light.  Neck: Normal range of motion. Neck supple.  Cardiovascular: Normal rate and regular rhythm.   Pulmonary/Chest: Effort normal. No  respiratory distress. She has no wheezes. She has no rales. She exhibits no tenderness.  Course breath sounds. 98% RA  Abdominal: Soft. Bowel sounds are normal.  Lymphadenopathy:    She has no cervical adenopathy.  Neurological: She is alert and oriented to person, place, and time.  Skin: Skin is warm and dry.      Assessment & Plan:  Acute bronchitis - Plan: azithromycin (ZITHROMAX) 500 MG tablet, promethazine-codeine (PHENERGAN WITH CODEINE) 6.25-10 MG/5ML syrup, predniSONE (DELTASONE) 20 MG tablet, POC Influenza A&B Flu negative

## 2013-11-21 MED ORDER — AZITHROMYCIN 250 MG PO TABS: ORAL_TABLET | ORAL | Status: AC

## 2013-11-21 NOTE — Addendum Note (Signed)
Addended by: Vicie Mutters R on: 11/21/2013 08:00 PM   Modules accepted: Orders

## 2013-11-22 ENCOUNTER — Encounter: Payer: Self-pay | Admitting: Internal Medicine

## 2013-11-24 ENCOUNTER — Encounter: Payer: Self-pay | Admitting: *Deleted

## 2013-11-28 ENCOUNTER — Ambulatory Visit: Payer: No Typology Code available for payment source

## 2013-11-28 ENCOUNTER — Ambulatory Visit (INDEPENDENT_AMBULATORY_CARE_PROVIDER_SITE_OTHER): Payer: No Typology Code available for payment source | Admitting: Internal Medicine

## 2013-11-28 VITALS — BP 122/74 | HR 110 | Temp 98.6°F | Resp 17 | Ht 63.5 in | Wt 207.0 lb

## 2013-11-28 DIAGNOSIS — R059 Cough, unspecified: Secondary | ICD-10-CM

## 2013-11-28 DIAGNOSIS — R0602 Shortness of breath: Secondary | ICD-10-CM

## 2013-11-28 DIAGNOSIS — J45901 Unspecified asthma with (acute) exacerbation: Secondary | ICD-10-CM

## 2013-11-28 DIAGNOSIS — R05 Cough: Secondary | ICD-10-CM

## 2013-11-28 LAB — POCT CBC
Granulocyte percent: 46 %G (ref 37–80)
HCT, POC: 41.4 % (ref 37.7–47.9)
Hemoglobin: 13.2 g/dL (ref 12.2–16.2)
Lymph, poc: 3.6 — AB (ref 0.6–3.4)
MCH, POC: 31.1 pg (ref 27–31.2)
MCHC: 31.9 g/dL (ref 31.8–35.4)
MCV: 97.5 fL — AB (ref 80–97)
MID (cbc): 0.8 (ref 0–0.9)
MPV: 8.7 fL (ref 0–99.8)
POC Granulocyte: 3.7 (ref 2–6.9)
POC LYMPH PERCENT: 44.4 %L (ref 10–50)
POC MID %: 9.6 %M (ref 0–12)
Platelet Count, POC: 373 10*3/uL (ref 142–424)
RBC: 4.25 M/uL (ref 4.04–5.48)
RDW, POC: 12.5 %
WBC: 8.1 10*3/uL (ref 4.6–10.2)

## 2013-11-28 IMAGING — CR DG CHEST 2V
3 series · 3 of 3 positions shown · non-contrast
Comparison: [DATE]

CLINICAL DATA: Shortness of breath

EXAM:
CHEST  2 VIEW

[PA (1 of 2)]
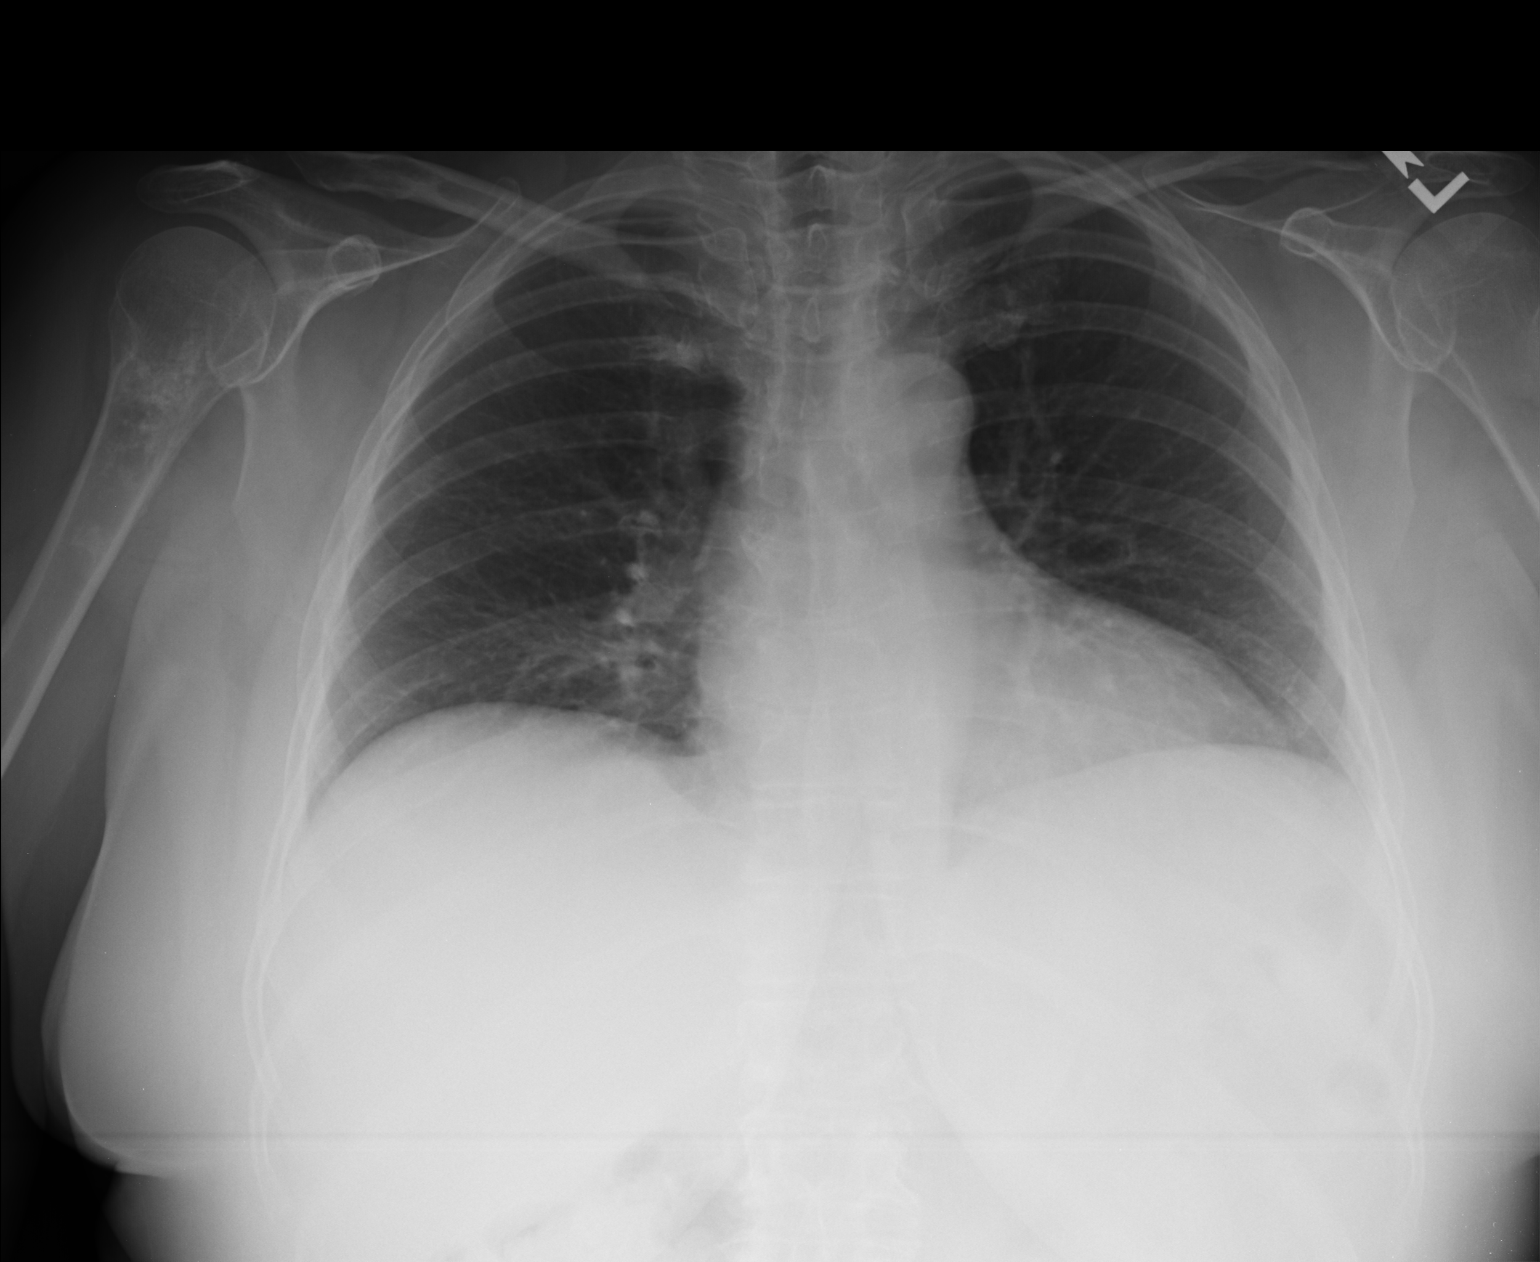

[lateral]
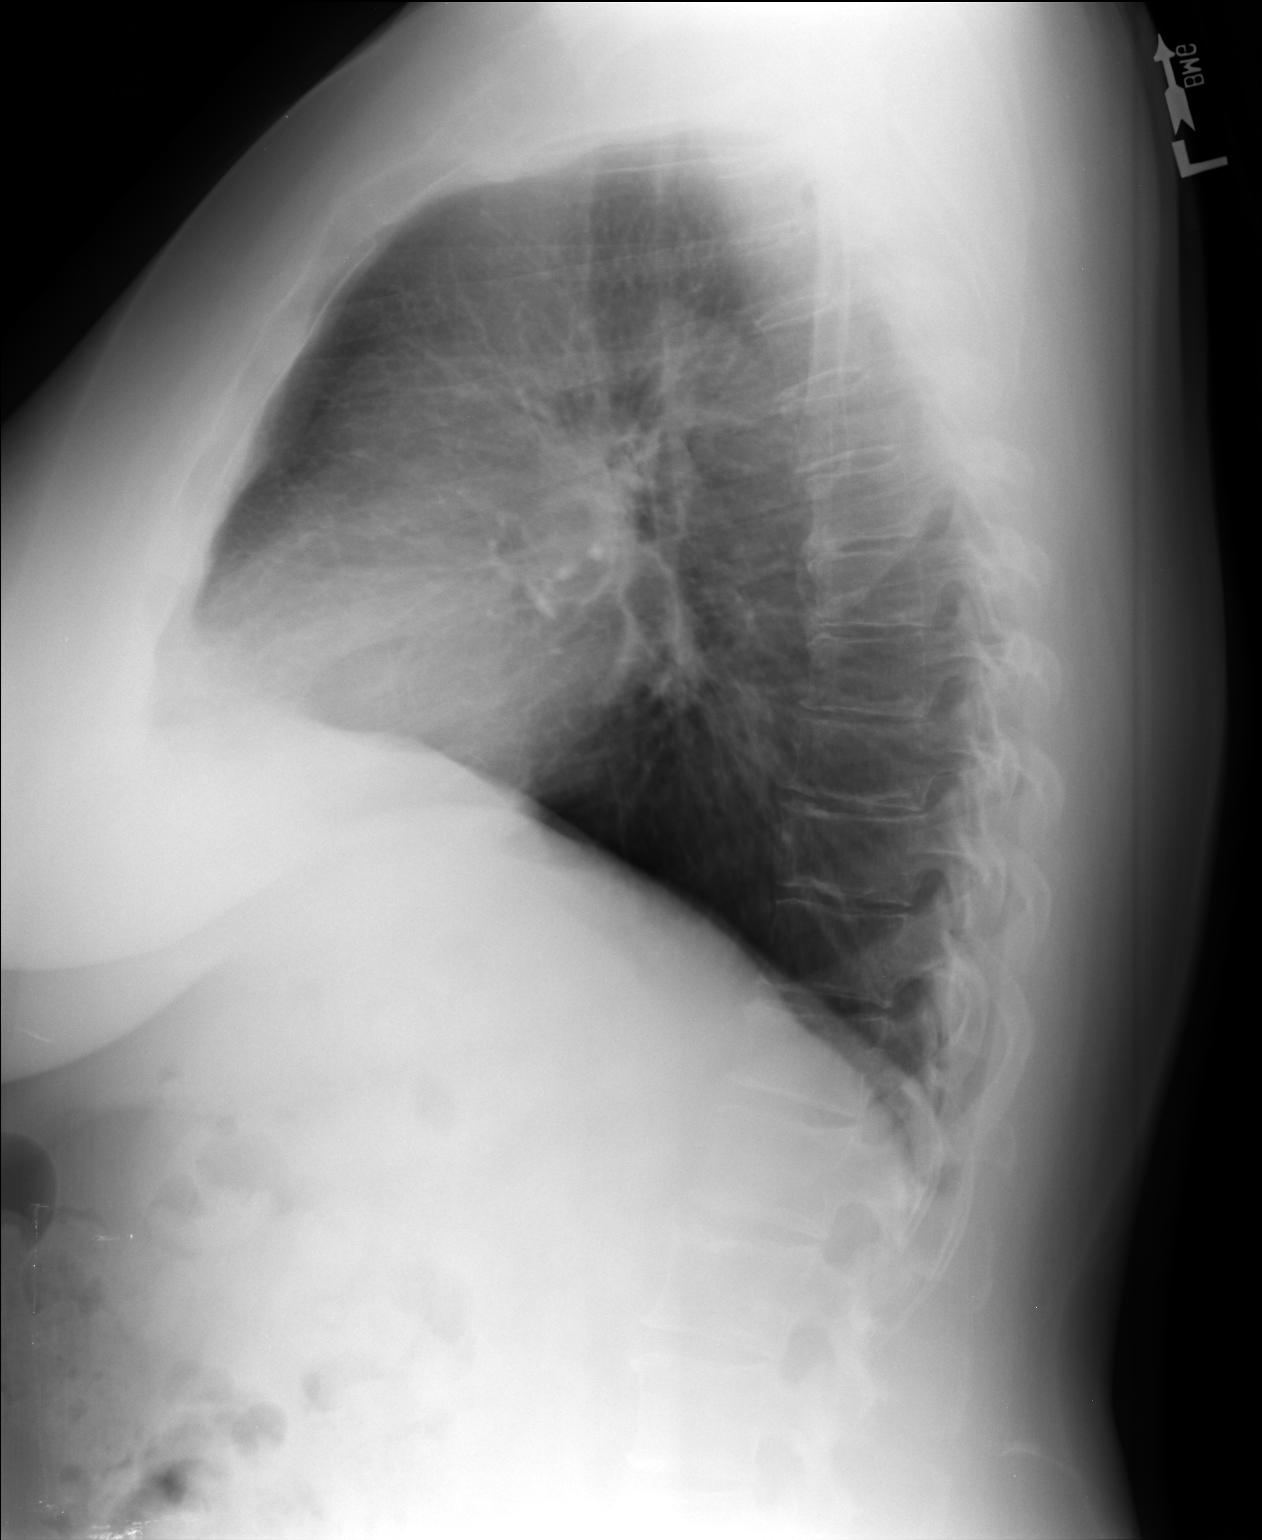

[PA (2 of 2)]
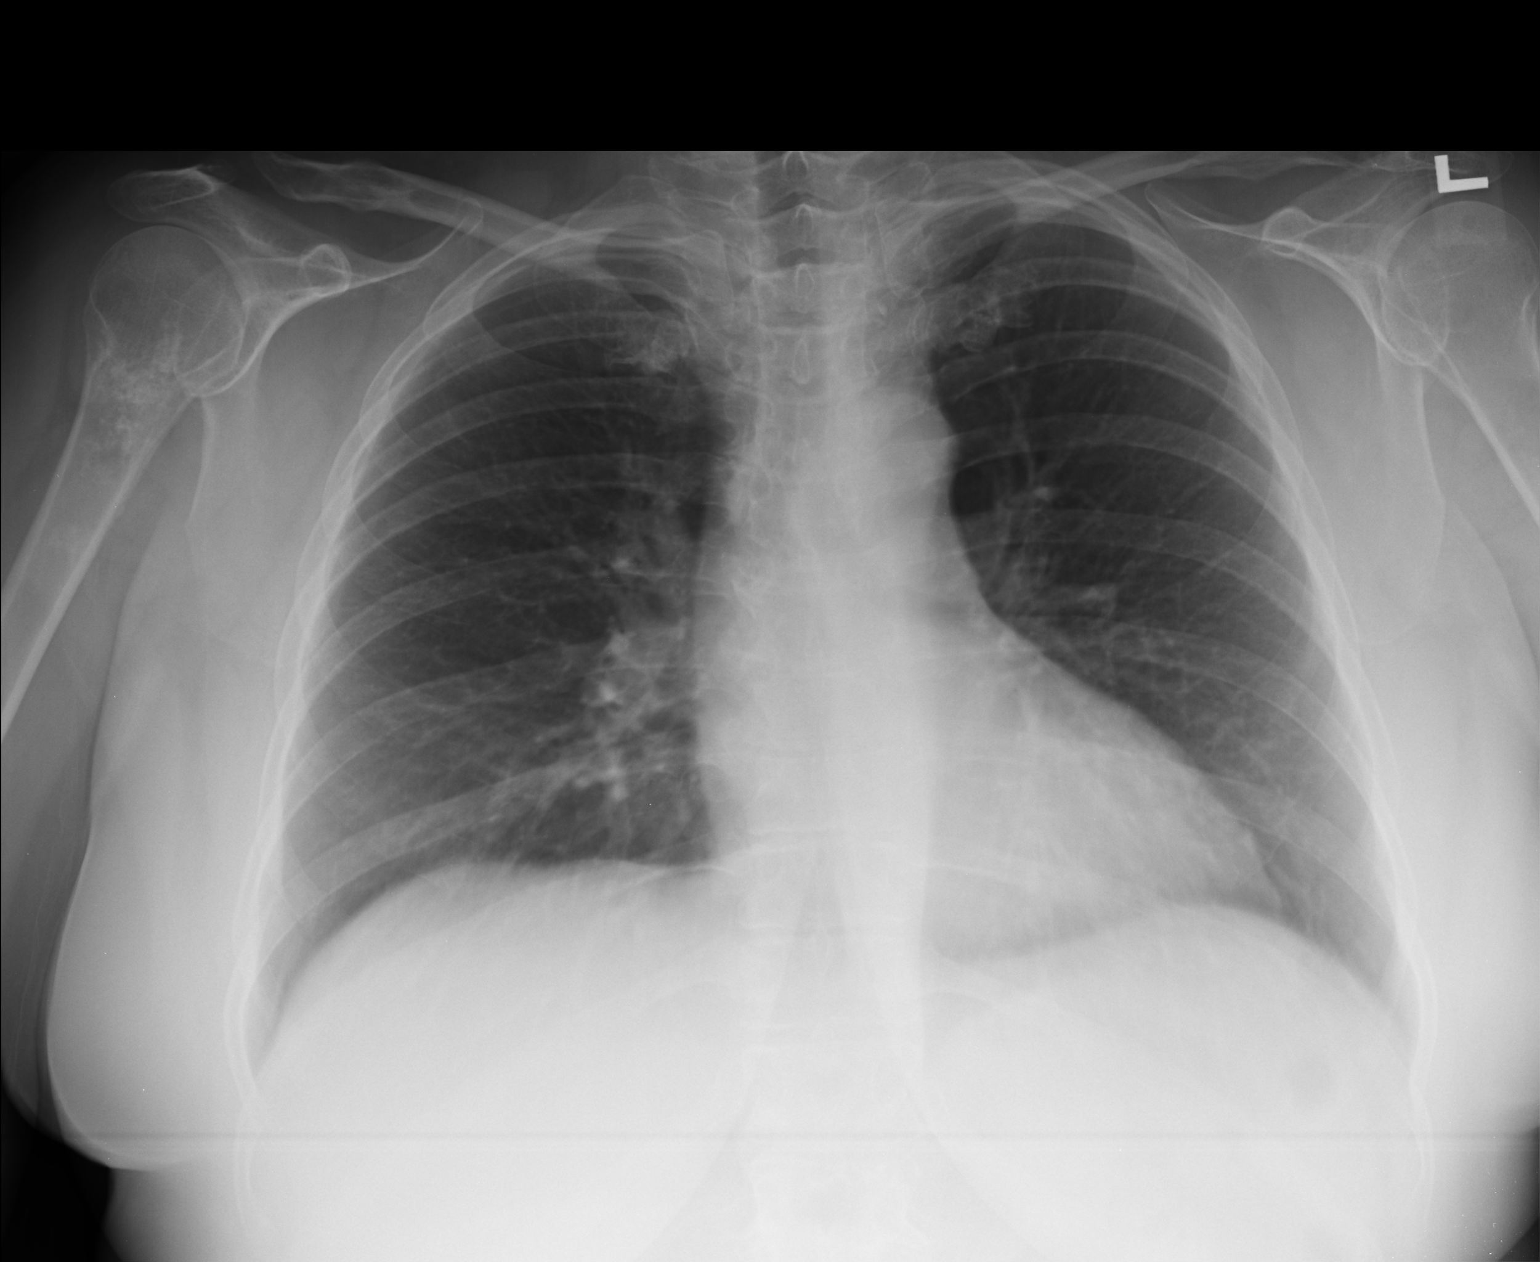

[3 of 3 positions shown; findings below may reference images not displayed]

FINDINGS: Low lung volumes. This accentuates the basilar vascular markings and
heart size. Lungs remains clear otherwise. No focal pneumonia,
collapse or consolidation. No edema, effusion or pneumothorax.
Trachea is midline. Chronic sclerotic change in the right proximal
humerus, suspect remote bone infarct.
IMPRESSION: Low volume exam.  No acute finding

## 2013-11-28 MED ORDER — FLUTICASONE-SALMETEROL 250-50 MCG/DOSE IN AEPB: 1.0000 | INHALATION_SPRAY | Freq: Two times a day (BID) | RESPIRATORY_TRACT | Status: DC

## 2013-11-28 MED ORDER — IPRATROPIUM BROMIDE 0.02 % IN SOLN: 0.5000 mg | Freq: Once | RESPIRATORY_TRACT | Status: DC

## 2013-11-28 MED ORDER — ALBUTEROL SULFATE (2.5 MG/3ML) 0.083% IN NEBU: 2.5000 mg | INHALATION_SOLUTION | Freq: Once | RESPIRATORY_TRACT | Status: DC

## 2013-11-28 MED ORDER — HYDROCOD POLST-CHLORPHEN POLST 10-8 MG/5ML PO LQCR: 5.0000 mL | Freq: Two times a day (BID) | ORAL | Status: DC | PRN

## 2013-11-28 MED ORDER — ALBUTEROL SULFATE (2.5 MG/3ML) 0.083% IN NEBU: 2.5000 mg | INHALATION_SOLUTION | Freq: Four times a day (QID) | RESPIRATORY_TRACT | Status: DC | PRN

## 2013-11-28 MED ORDER — PREDNISONE 20 MG PO TABS: 60.0000 mg | ORAL_TABLET | Freq: Every day | ORAL | Status: DC

## 2013-11-28 NOTE — Progress Notes (Signed)
   Subjective:    Patient ID: Haley White, female    DOB: 09-Aug-1956, 58 y.o.   MRN: [id number]  HPI 58 year old female presents for evaluation of cough x 2 weeks. Symptoms started on 1/14 and have persisted. She was seen at [facility] on 1/16 and diagnosed with bronchitis and treated with Zpack, prednisone, and cough syrup.  States she does not think these have helped at all.  Admits her symptoms do not seem to be any worse but she is tired of coughing.  Says it feels like she has chest congestion that is "stuck" in her lungs.  Overall cough not productive but she does feel like she has congestion that she "needs to get up."    Does have a hx of asthma treated with albuterol inhaler as needed.  Has been well controlled for "years."  Does have a nebulizer at home but has not used it in a long time and does not have any solution for it.  Admits to some SOB and fatigue but no wheezing, chest pain, or hemoptysis.    Patient is otherwise doing well with no other concerns today.     Review of Systems  Constitutional: Negative for fever and chills.  HENT: Negative for congestion, ear pain, postnasal drip and sinus pressure.   Respiratory: Positive for cough and shortness of breath. Negative for chest tightness and wheezing.   Cardiovascular: Negative for chest pain.  Gastrointestinal: Negative for nausea and vomiting.  Neurological: Negative for dizziness and headaches.       Objective:   Physical Exam  Constitutional: She is oriented to person, place, and time. She appears well-developed and well-nourished.  HENT:  Head: Normocephalic and atraumatic.  Right Ear: Hearing, tympanic membrane, external ear and ear canal normal.  Left Ear: Hearing, tympanic membrane, external ear and ear canal normal.  Mouth/Throat: Uvula is midline, oropharynx is clear and moist and mucous membranes are normal.  Eyes: Conjunctivae are normal.  Neck: Normal range of motion.  Cardiovascular: Normal rate, regular rhythm  and normal heart sounds.   Pulmonary/Chest: Effort normal and breath sounds normal.  Neurological: She is alert and oriented to person, place, and time.  Psychiatric: She has a normal mood and affect. Her behavior is normal. Judgment and thought content normal.     Patient does report improvement in symptoms after nebulizer treatment.     [facility] reading (PRIMARY) by  Dr. Laney Pastor as no acute infiltrate or consolidation.   Assessment & Plan:  Cough - Plan: DG Chest 2 View, POCT CBC, chlorpheniramine-HYDROcodone (TUSSIONEX PENNKINETIC ER) 10-8 MG/5ML LQCR  Shortness of breath - Plan: albuterol (PROVENTIL) (2.5 MG/3ML) 0.083% nebulizer solution 2.5 mg, ipratropium (ATROVENT) nebulizer solution 0.5 mg, Fluticasone-Salmeterol (ADVAIR DISKUS) 250-50 MCG/DOSE AEPB, albuterol (PROVENTIL) (2.5 MG/3ML) 0.083% nebulizer solution  Asthma with acute exacerbation - Plan: predniSONE (DELTASONE) 20 MG tablet, Fluticasone-Salmeterol (ADVAIR DISKUS) 250-50 MCG/DOSE AEPB, albuterol (PROVENTIL) (2.5 MG/3ML) 0.083% nebulizer solution  Exam, labs, and x-ray reassuring.  Likely post-viral inflammatory cough with RAD and asthma exacerbation Will try higher dose of prednisone - 60 mg daily x 5 days Advair twice daily - rinse mouth after use Rx provided for albuterol nebulizer solution to with neb she has at home Tussionex q12hours prn cough  Continue Mucinex as directed RTC precautions discussed.  Follow up if symptoms worsening or fail to improve.   I have reviewed and agree with documentation. Robert P. Laney Pastor, M.D.

## 2013-12-04 ENCOUNTER — Emergency Department (INDEPENDENT_AMBULATORY_CARE_PROVIDER_SITE_OTHER): Payer: Worker's Compensation

## 2013-12-04 ENCOUNTER — Encounter (HOSPITAL_COMMUNITY): Payer: Self-pay | Admitting: Emergency Medicine

## 2013-12-04 ENCOUNTER — Emergency Department (INDEPENDENT_AMBULATORY_CARE_PROVIDER_SITE_OTHER)
Admission: EM | Admit: 2013-12-04 | Discharge: 2013-12-04 | Disposition: A | Discharge status: Home / Self Care | Payer: Worker's Compensation | Source: Home / Self Care

## 2013-12-04 DIAGNOSIS — M79609 Pain in unspecified limb: Secondary | ICD-10-CM

## 2013-12-04 DIAGNOSIS — M79671 Pain in right foot: Secondary | ICD-10-CM

## 2013-12-04 IMAGING — CR DG FOOT COMPLETE 3+V*R*
3 series · 3 of 3 positions shown · non-contrast
Comparison: None.

CLINICAL DATA: Injured right foot 1 day ago. Lateral right foot
pain.

EXAM:
RIGHT FOOT COMPLETE - 3+ VIEW

[view not recorded (1 of 3)]
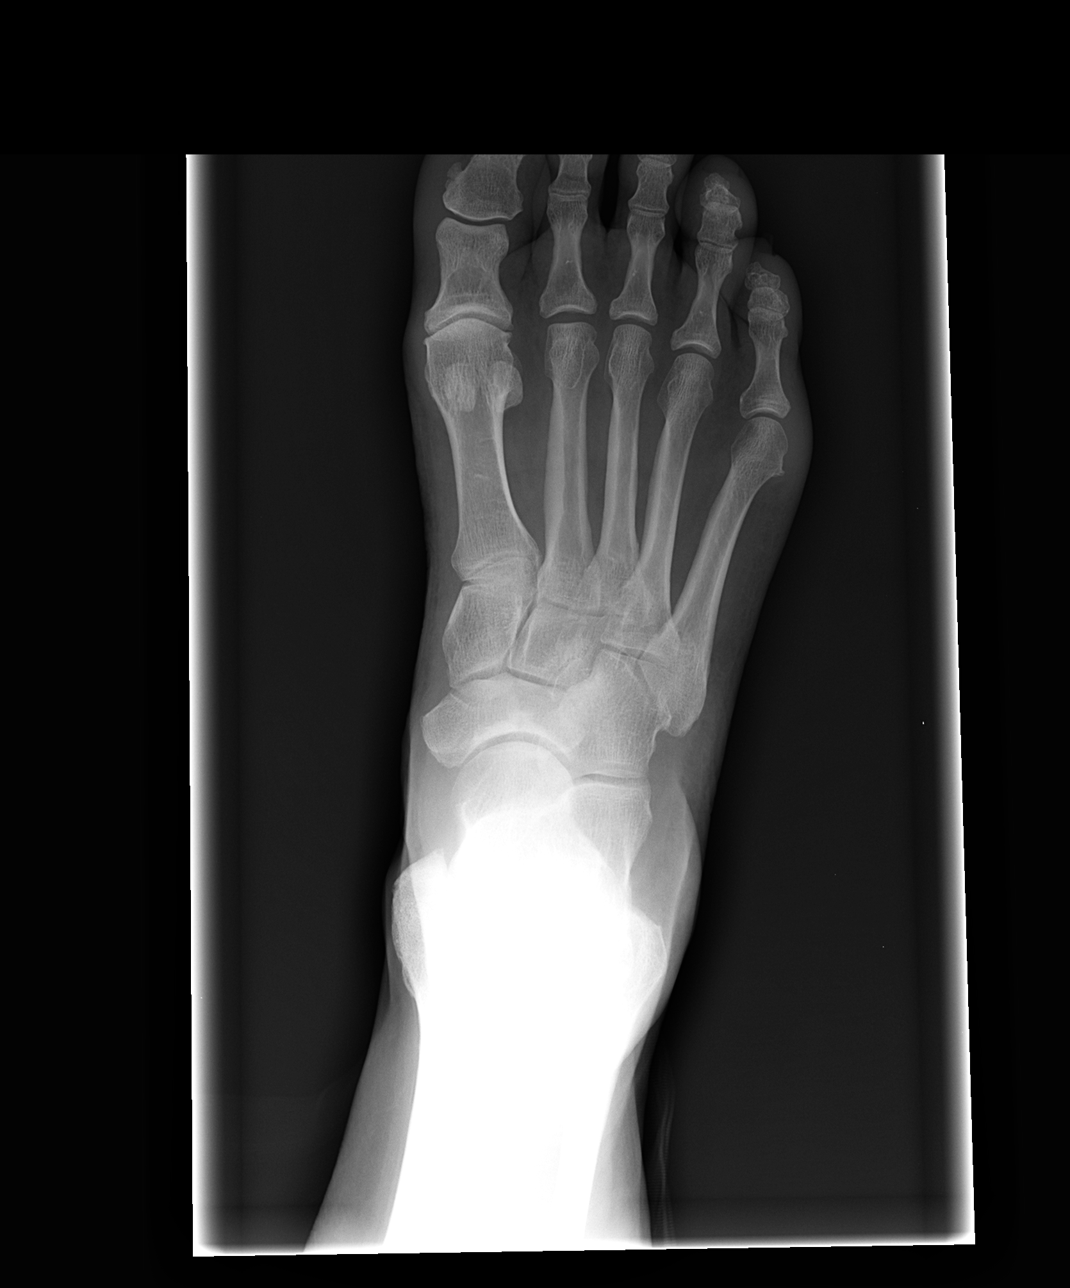

[view not recorded (2 of 3)]
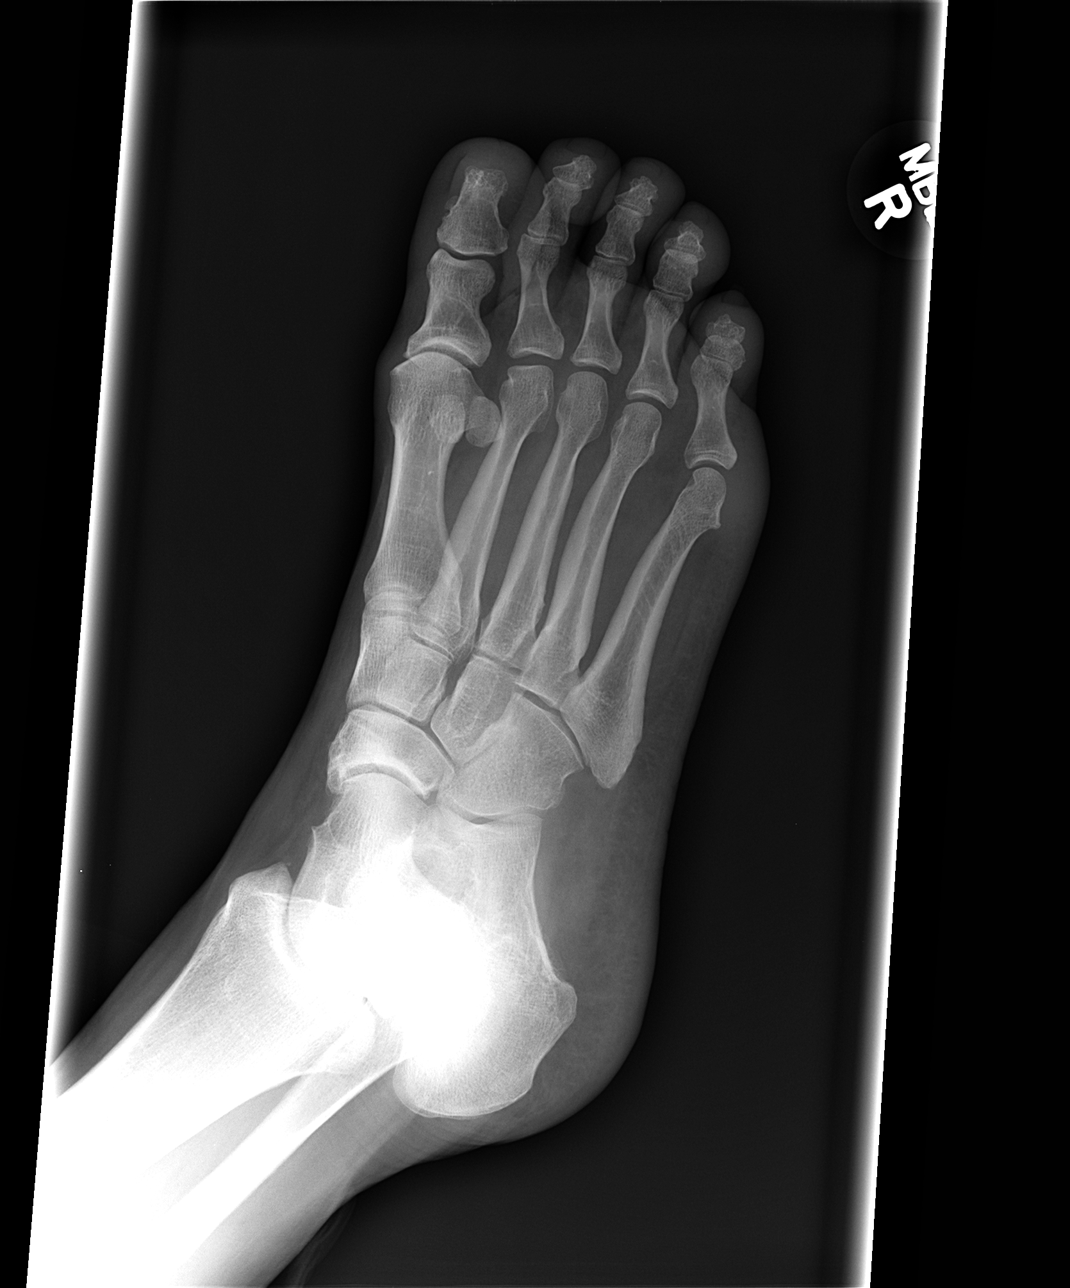

[view not recorded (3 of 3)]
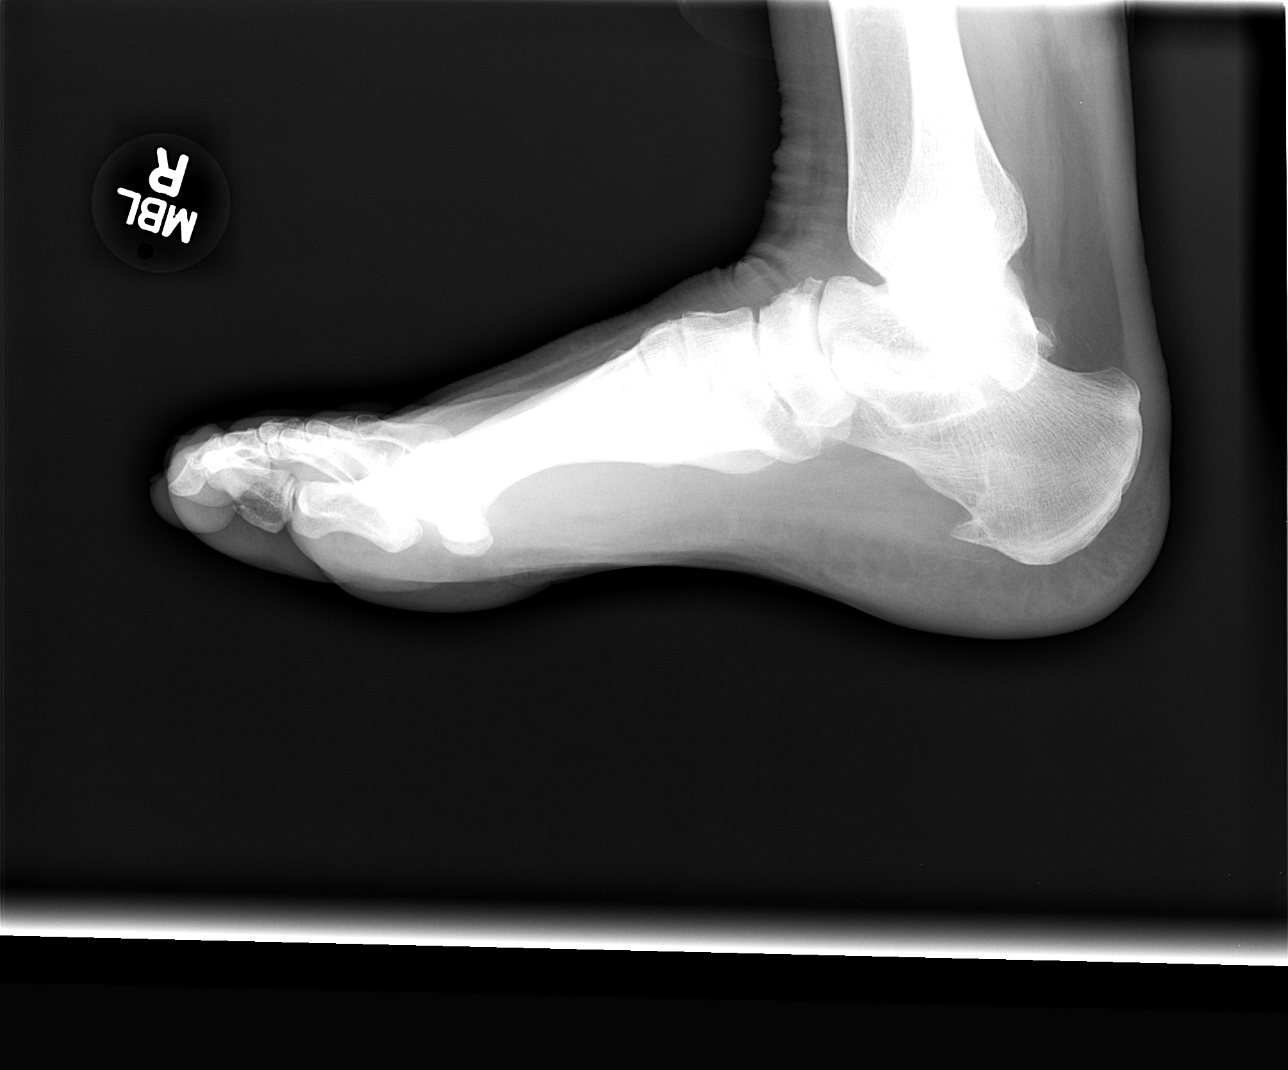

[3 of 3 positions shown; findings below may reference images not displayed]

FINDINGS: The right foot demonstrates no fracture or dislocation. There is
mild osteoarthritis of the first MTP joint. There is a small plantar
calcaneal spur. There is a well corticated osseous fragment along
the dorsal proximal aspect of the navicular likely representing
sequela of prior injury. There is no soft tissue abnormality. There
is no subcutaneous emphysema or radiopaque foreign bodies.
IMPRESSION: No acute osseous injury of the right foot.

## 2013-12-04 MED ORDER — KETOROLAC TROMETHAMINE 60 MG/2ML IM SOLN
INTRAMUSCULAR | Status: AC
  Filled 2013-12-04: qty 2

## 2013-12-04 MED ORDER — KETOROLAC TROMETHAMINE 60 MG/2ML IM SOLN
60.0000 mg | Freq: Once | INTRAMUSCULAR | Status: AC
  Administered 2013-12-04: 60 mg via INTRAMUSCULAR

## 2013-12-04 MED ORDER — HYDROCODONE-ACETAMINOPHEN 5-325 MG PO TABS: 2.0000 | ORAL_TABLET | ORAL | Status: DC | PRN

## 2013-12-04 NOTE — ED Notes (Signed)
Playing with children on playground.  Tripped on uneven surface and fell.  States she injured her foot, but  not sure how she did it.  C/o pain on R lateral foot.

## 2013-12-04 NOTE — ED Provider Notes (Signed)
CSN: [id number]     Arrival date & time 12/04/13  1803 History   None    Chief Complaint  Patient presents with  . Foot Injury   (Consider location/radiation/quality/duration/timing/severity/associated sxs/prior Treatment)  HPI  The patient is a 58 year old female presenting today following a fall on rubber matting at playground.  Patient is complaining of pain along 5th metatarsal and states has difficulty bearing weight.    Past Medical History  Diagnosis Date  . Depression   . Asthma   . Cataract   . Hypertension   . Anxiety   . Obese   . 250.00     not on med for diabetes  . Hyperlipidemia   . Arthritis    Past Surgical History  Procedure Laterality Date  . Knee surgery Left 1998  . Foot surgery Left 2002  . Appendectomy  1984  . Cataract extraction w/ intraocular lens  implant, bilateral  2014  . Cesarean section  1984, 1987, 1994    X3  . Tubal ligation  1994  . Rotator cuff repair Right 2007   Family History  Problem Relation Age of Onset  . Dementia Father   . Anxiety disorder Sister   . Colon cancer Neg Hx    History  Substance Use Topics  . Smoking status: Former Smoker    Quit date: 11/04/1977  . Smokeless tobacco: Never Used  . Alcohol Use: 3.6 oz/week    6 Glasses of wine per week     Comment: occasional   OB History   Grav Para Term Preterm Abortions TAB SAB Ect Mult Living                 Review of Systems  Constitutional: Negative.   HENT: Negative.   Eyes: Negative.   Respiratory: Negative.   Cardiovascular: Negative.   Gastrointestinal: Negative.   Endocrine: Negative.   Genitourinary: Negative.   Musculoskeletal: Positive for gait problem.       Foot pain.    Skin: Negative.   Allergic/Immunologic: Negative.   Neurological: Negative.   Hematological: Negative.   Psychiatric/Behavioral: Negative.     Allergies  Review of patient's allergies indicates no known allergies.  Home Medications   Current Outpatient Rx  Name   Route  Sig  Dispense  Refill  . albuterol (PROVENTIL HFA;VENTOLIN HFA) 108 (90 BASE) MCG/ACT inhaler   Inhalation   Inhale 2 puffs into the lungs every 6 (six) hours as needed. wheezing   8.5 g   2   . Ascorbic Acid (VITAMIN C PO)   Oral   Take 1 tablet by mouth daily.         . Calcium Carbonate-Vitamin D (CALCIUM + D PO)   Oral   Take 1 tablet by mouth daily.         Marland Kitchen FLUoxetine (PROZAC) 20 MG tablet   Oral   Take 20 mg by mouth daily.         . methylphenidate (RITALIN) 20 MG tablet   Oral   Take 20 mg by mouth 2 (two) times daily.         Marland Kitchen albuterol (PROVENTIL) (2.5 MG/3ML) 0.083% nebulizer solution   Nebulization   Take 3 mLs (2.5 mg total) by nebulization every 6 (six) hours as needed for wheezing or shortness of breath.   75 mL   0   . chlorpheniramine-HYDROcodone (TUSSIONEX PENNKINETIC ER) 10-8 MG/5ML LQCR   Oral   Take 5 mLs by mouth  every 12 (twelve) hours as needed for cough (cough).   115 mL   0   . cyclobenzaprine (FLEXERIL) 10 MG tablet   Oral   Take 1 tablet (10 mg total) by mouth 3 (three) times daily as needed for muscle spasms.   30 tablet   0   . Fluticasone-Salmeterol (ADVAIR DISKUS) 250-50 MCG/DOSE AEPB   Inhalation   Inhale 1 puff into the lungs 2 (two) times daily.   60 each   0   . HYDROcodone-acetaminophen (NORCO/VICODIN) 5-325 MG per tablet   Oral   Take 2 tablets by mouth every 4 (four) hours as needed.   15 tablet   0   . phentermine (ADIPEX-P) 37.5 MG tablet   Oral   Take 37.5 mg by mouth daily before breakfast.          . predniSONE (DELTASONE) 20 MG tablet   Oral   Take 3 tablets (60 mg total) by mouth daily with breakfast.   15 tablet   0   . promethazine-codeine (PHENERGAN WITH CODEINE) 6.25-10 MG/5ML syrup   Oral   Take 5 mLs by mouth every 6 (six) hours as needed for cough.   240 mL   0    BP 125/108  Pulse 88  Temp(Src) 98.7 F (37.1 C) (Oral)  Resp 20  SpO2 97%  LMP 11/26/2011  Physical Exam   Nursing note and vitals reviewed. Constitutional: She is oriented to person, place, and time. She appears well-developed and well-nourished. No distress.  HENT:  Head: Normocephalic and atraumatic.  Eyes: Pupils are equal, round, and reactive to light.  Cardiovascular: Normal rate, regular rhythm, normal heart sounds and intact distal pulses.  Exam reveals no gallop and no friction rub.   No murmur heard. Pulmonary/Chest: Effort normal and breath sounds normal. No respiratory distress. She has no wheezes. She has no rales. She exhibits no tenderness.  Musculoskeletal: Normal range of motion. She exhibits tenderness. She exhibits no edema.  Patient is able to bear weight and ambulate however it is painful. There is no obvious asymmetry or deformity of the right foot as compared to the left foot. There is no surface trauma, ecchymosis, erythema, lesions, ulcers or break in skin integrity. There is no tenderness to palpation of her toes or hindfoot, however patient reports discomfort with palpation of her lateral surface of fifth metatarsal. Distal motor and neurovascular status is intact.  Neurological: She is alert and oriented to person, place, and time. She displays normal reflexes. She exhibits normal muscle tone. Coordination normal.  Skin: Skin is warm and dry. No rash noted. She is not diaphoretic. No erythema. No pallor.  Psychiatric: She has a normal mood and affect. Her behavior is normal.    ED Course  Procedures (including critical care time) Labs Review Labs Reviewed - No data to display Imaging Review Dg Foot Complete Right  12/04/2013   CLINICAL DATA:  Injured right foot 1 day ago. Lateral right foot pain.  EXAM: RIGHT FOOT COMPLETE - 3+ VIEW  COMPARISON:  None.  FINDINGS: The right foot demonstrates no fracture or dislocation. There is mild osteoarthritis of the first MTP joint. There is a small plantar calcaneal spur. There is a well corticated osseous fragment along the dorsal  proximal aspect of the navicular likely representing sequela of prior injury. There is no soft tissue abnormality. There is no subcutaneous emphysema or radiopaque foreign bodies.  IMPRESSION: No acute osseous injury of the right foot.   Electronically Signed  By: Kathreen Devoid   On: 12/04/2013 20:04   There is no fracture, dislocation, soft tissue swelling or foreign body noted.   MDM   1. Foot pain, right    Meds ordered this encounter  Medications  . ketorolac (TORADOL) injection 60 mg    Sig:   . HYDROcodone-acetaminophen (NORCO/VICODIN) 5-325 MG per tablet    Sig: Take 2 tablets by mouth every 4 (four) hours as needed.    Dispense:  15 tablet    Refill:  0   Care discussed with Dr. Georgina Snell. Patient is to follow up with orthopedist this week. Patient verbalizes understanding of plan of care.    Jacqualyn Posey, NP 12/04/13 2104

## 2013-12-04 NOTE — Discharge Instructions (Signed)
RICE: Routine Care for Injuries The routine care of many injuries includes Rest, Ice, Compression, and Elevation (RICE). HOME CARE INSTRUCTIONS  Rest is needed to allow your body to heal. Routine activities can usually be resumed when comfortable. Injured tendons and bones can take up to 6 weeks to heal. Tendons are the cord-like structures that attach muscle to bone.  Ice following an injury helps keep the swelling down and reduces pain. Put ice in a plastic bag.Splint Care Splints protect and rest injuries. Splints can be made of plaster, fiberglass, or metal. They are used to treat broken bones, sprains, tendonitis, and other injuries. HOME CARE Keep the injured area raised (elevated) while sitting or lying down. Keep the injured body part just above the level of the heart. This will decrease puffiness (swelling) and pain. If an elastic bandage was used to hold the splint, it can be loosened. Only loosen it to make room for puffiness and to ease pain. Keep the splint clean and dry. Do not scratch the skin under the splint with sharp or pointed objects. Follow up with your doctor as told. GET HELP RIGHT AWAY IF:  There is more pain or pressure around the injury. There is numbness, tingling, or pain in the toes or fingers past the injury. The fingers or toes become cold or blue. The splint becomes too soft or breaks before the injury is healed. MAKE SURE YOU:  Understand these instructions. Will watch this condition. Will get help right away if you are not doing well or get worse. Document Released: 07/30/2008 Document Revised: 01/13/2012 Document Reviewed: 07/30/2008 [facility] Patient Information 2014 [place].    Place a towel between your skin and the bag.  Leave the ice on for 15-20 minutes, 03-04 times a day. Do this while awake, for the first 24 to 48 hours. After that, continue as directed by your caregiver.  Compression helps keep swelling down. It also gives support  and helps with discomfort. If an elastic bandage has been applied, it should be removed and reapplied every 3 to 4 hours. It should not be applied tightly, but firmly enough to keep swelling down. Watch fingers or toes for swelling, bluish discoloration, coldness, numbness, or excessive pain. If any of these problems occur, remove the bandage and reapply loosely. Contact your caregiver if these problems continue.  Elevation helps reduce swelling and decreases pain. With extremities, such as the arms, hands, legs, and feet, the injured area should be placed near or above the level of the heart, if possible. SEEK IMMEDIATE MEDICAL CARE IF:  You have persistent pain and swelling.  You develop redness, numbness, or unexpected weakness.  Your symptoms are getting worse rather than improving after several days. These symptoms may indicate that further evaluation or further X-rays are needed. Sometimes, X-rays may not show a small broken bone (fracture) until 1 week or 10 days later. Make a follow-up appointment with your caregiver. Ask when your X-ray results will be ready. Make sure you get your X-ray results. Document Released: 02/02/2001 Document Revised: 01/13/2012 Document Reviewed: 03/22/2011 [facility] Patient Information 2014 [place], [place].

## 2013-12-06 NOTE — ED Provider Notes (Signed)
Medical screening examination/treatment/procedure(s) were performed by a resident physician or non-physician practitioner and as the supervising physician I was immediately available for consultation/collaboration.  Lynne Leader, MD    Gregor Hams, MD 12/06/13 [phone]

## 2013-12-31 ENCOUNTER — Ambulatory Visit (INDEPENDENT_AMBULATORY_CARE_PROVIDER_SITE_OTHER): Payer: No Typology Code available for payment source | Admitting: Emergency Medicine

## 2013-12-31 ENCOUNTER — Encounter: Payer: Self-pay | Admitting: Emergency Medicine

## 2013-12-31 VITALS — BP 144/82 | HR 100 | Temp 98.0°F | Resp 18 | Ht 63.0 in | Wt 206.0 lb

## 2013-12-31 DIAGNOSIS — R509 Fever, unspecified: Secondary | ICD-10-CM

## 2013-12-31 DIAGNOSIS — R059 Cough, unspecified: Secondary | ICD-10-CM

## 2013-12-31 DIAGNOSIS — R05 Cough: Secondary | ICD-10-CM

## 2013-12-31 MED ORDER — HYDROCOD POLST-CHLORPHEN POLST 10-8 MG/5ML PO LQCR: 5.0000 mL | Freq: Two times a day (BID) | ORAL | Status: DC

## 2013-12-31 MED ORDER — AMOXICILLIN-POT CLAVULANATE 875-125 MG PO TABS: 1.0000 | ORAL_TABLET | Freq: Two times a day (BID) | ORAL | Status: AC

## 2013-12-31 MED ORDER — PREDNISONE 10 MG PO TABS: ORAL_TABLET | ORAL | Status: DC

## 2013-12-31 NOTE — Progress Notes (Signed)
Subjective:    Patient ID: Haley White, female    DOB: 1956-03-17, 58 y.o.   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 She admits to aches/ productive green cough/ fever. No relief with tylenol, increased fluids, and rest. She notes tussionex helps with cough and sleep.  Headache  Associated symptoms include coughing and a sore throat.  Cough Associated symptoms include headaches and a sore throat.   Current Outpatient Prescriptions on File Prior to Visit  Medication Sig Dispense Refill  . albuterol (PROVENTIL HFA;VENTOLIN HFA) 108 (90 BASE) MCG/ACT inhaler Inhale 2 puffs into the lungs every 6 (six) hours as needed. wheezing  8.5 g  2  . Ascorbic Acid (VITAMIN C PO) Take 1 tablet by mouth daily.      . Calcium Carbonate-Vitamin D (CALCIUM + D PO) Take 1 tablet by mouth daily.      Marland Kitchen FLUoxetine (PROZAC) 20 MG tablet Take 20 mg by mouth daily.      . Fluticasone-Salmeterol (ADVAIR DISKUS) 250-50 MCG/DOSE AEPB Inhale 1 puff into the lungs 2 (two) times daily.  60 each  0  . methylphenidate (RITALIN) 20 MG tablet Take 20 mg by mouth 2 (two) times daily.       Current Facility-Administered Medications on File Prior to Visit  Medication Dose Route Frequency Provider Last Rate Last Dose  . albuterol (PROVENTIL) (2.5 MG/3ML) 0.083% nebulizer solution 2.5 mg  2.5 mg Nebulization Once Heather M Marte, PA-C      . ipratropium (ATROVENT) nebulizer solution 0.5 mg  0.5 mg Nebulization Once Heather M Marte, PA-C       Allergies  Allergen Reactions  . Biaxin [Clarithromycin]     GI upset  . Serevent [Salmeterol]     Palpitations  . Tequin [Gatifloxacin]     GI upset. thrush   Past Medical History  Diagnosis Date  . Depression   . Asthma   . Cataract   . Hypertension   . Anxiety   . Obese   . 250.00     not on med for diabetes  . Hyperlipidemia   . Arthritis       Review of Systems  Constitutional: Positive for fatigue.   HENT: Positive for sore throat.   Respiratory: Positive for cough.   Musculoskeletal: Positive for arthralgias and joint swelling.  Neurological: Positive for headaches.  All other systems reviewed and are negative.   BP 144/82  Pulse 100  Temp(Src) 98 F (36.7 C) (Temporal)  Resp 18  Ht 5\' 3"  (1.6 m)  Wt 206 lb (93.441 kg)  BMI 36.50 kg/m2  LMP 11/26/2011     Objective:   Physical Exam  Nursing note and vitals reviewed. Constitutional: She is oriented to person, place, and time. She appears well-developed and well-nourished.  HENT:  Head: Normocephalic and atraumatic.  Right Ear: External ear normal.  Left Ear: External ear normal.  Nose: Nose normal.  Mouth/Throat: Oropharynx is clear and moist. No oropharyngeal exudate.  Cloudy TM's bilaterally Post Pharynx with mild erythema   Eyes: Conjunctivae and EOM are normal.  Neck: Normal range of motion.  Cardiovascular: Normal rate, regular rhythm, normal heart sounds and intact distal pulses.   Pulmonary/Chest: Effort normal.  Congested Breath sounds, clears some with cough   Musculoskeletal: Normal range of motion.  Lymphadenopathy:    She has no cervical adenopathy.  Neurological: She is alert and oriented to person, place, and  time.  Skin: Skin is warm and dry.  Psychiatric: She has a normal mood and affect. Judgment normal.      FLU NEG    Assessment & Plan:  1. ? FLU- with Neg test, advise tylenol 500 mg q 8 hrs,push fluids, w/c if SX increase or ER. 2. ? Bronchitis/ cough-Increase inhaler to q 4 hours x 3days, Pred DP 10 mg AD, Tussionex AD. If symptoms increase Augmentin AD.

## 2013-12-31 NOTE — Patient Instructions (Signed)
Influenza A (H1N1) H1N1 formerly called "swine flu" is a new influenza virus causing sickness in people. The H1N1 virus is different from seasonal influenza viruses. However, the H1N1 symptoms are similar to seasonal influenza and it is spread from person to person. You may be at higher risk for serious problems if you have underlying serious medical conditions. The CDC and the World Health Organization are following reported cases around the world. CAUSES   The flu is thought to spread mainly person-to-person through coughing or sneezing of infected people.  A person may become infected by touching something with the virus on it and then touching their mouth or nose. SYMPTOMS   Fever.  Headache.  Tiredness.  Cough.  Sore throat.  Runny or stuffy nose.  Body aches.  Diarrhea and vomiting These symptoms are referred to as "flu-like symptoms." A lot of different illnesses, including the common cold, may have similar symptoms. DIAGNOSIS   There are tests that can tell if you have the H1N1 virus.  Confirmed cases of H1N1 will be reported to the state or local health department.  A doctor's exam may be needed to tell whether you have an infection that is a complication of the flu. HOME CARE INSTRUCTIONS   Stay informed. Visit the CDC website for current recommendations. Visit www.cdc.gov/H1N1flu/. You may also call 1-800-CDC-INFO (1-800-232-4636).  Get help early if you develop any of the above symptoms.  If you are at high risk from complications of the flu, talk to your caregiver as soon as you develop flu-like symptoms. Those at higher risk for complications include:  People 65 years or older.  People with chronic medical conditions.  Pregnant women.  Young children.  Your caregiver may recommend antiviral medicine to help treat the flu.  If you get the flu, get plenty of rest, drink enough water and fluids to keep your urine clear or pale yellow, and avoid using  alcohol or tobacco.  You may take over-the-counter medicine to relieve the symptoms of the flu if your caregiver approves. (Never give aspirin to children or teenagers who have flu-like symptoms, particularly fever). TREATMENT  If you do get sick, antiviral drugs are available. These drugs can make your illness milder and make you feel better faster. Treatment should start soon after illness starts. It is only effective if taken within the first day of becoming ill. Only your caregiver can prescribe antiviral medication.  PREVENTION   Cover your nose and mouth with a tissue or your arm when you cough or sneeze. Throw the tissue away.  Wash your hands often with soap and warm water, especially after you cough or sneeze. Alcohol-based cleaners are also effective against germs.  Avoid touching your eyes, nose or mouth. This is one way germs spread.  Try to avoid contact with sick people. Follow public health advice regarding school closures. Avoid crowds.  Stay home if you get sick. Limit contact with others to keep from infecting them. People infected with the H1N1 virus may be able to infect others anywhere from 1 day before feeling sick to 5-7 days after getting flu symptoms.  An H1N1 vaccine is available to help protect against the virus. In addition to the H1N1 vaccine, you will need to be vaccinated for seasonal influenza. The H1N1 and seasonal vaccines may be given on the same day. The CDC especially recommends the H1N1 vaccine for:  Pregnant women.  People who live with or care for children younger than 6 months of age.    Health care and emergency services personnel.  Persons between the ages of 80 months through 12 years of age.  People from ages 60 through 56 years who are at higher risk for H1N1 because of chronic health disorders or immune system problems. FACEMASKS In community and home settings, the use of facemasks and N95 respirators are not normally recommended. In certain  circumstances, a facemask or N95 respirator may be used for persons at increased risk of severe illness from influenza. Your caregiver can give additional recommendations for facemask use. IN CHILDREN, EMERGENCY WARNING SIGNS THAT NEED URGENT MEDICAL CARE:  Fast breathing or trouble breathing.  Bluish skin color.  Not drinking enough fluids.  Not waking up or not interacting normally.  Being so fussy that the child does not want to be held.  Your child has an oral temperature above 102 F (38.9 C), not controlled by medicine.  Your baby is older than 3 months with a rectal temperature of 102 F (38.9 C) or higher.  Your baby is 73 months old or younger with a rectal temperature of 100.4 F (38 C) or higher.  Flu-like symptoms improve but then return with fever and worse cough. IN ADULTS, EMERGENCY WARNING SIGNS THAT NEED URGENT MEDICAL CARE:  Difficulty breathing or shortness of breath.  Pain or pressure in the chest or abdomen.  Sudden dizziness.  Confusion.  Severe or persistent vomiting.  Bluish color.  You have a oral temperature above 102 F (38.9 C), not controlled by medicine.  Flu-like symptoms improve but return with fever and worse cough. SEEK IMMEDIATE MEDICAL CARE IF:  You or someone you know is experiencing any of the above symptoms. When you arrive at the emergency center, report that you think you have the flu. You may be asked to wear a mask and/or sit in a secluded area to protect others from getting sick. MAKE SURE YOU:   Understand these instructions.  Will watch your condition.  Will get help right away if you are not doing well or get worse. Some of this information courtesy of the CDC.  Document Released: 04/08/2008 Document Revised: 01/13/2012 Document Reviewed: 04/08/2008 [facility] Patient Information 2014 [place], [place]. Cough, Adult  A cough is a reflex. It helps you clear your throat and airways. A cough can help heal your body. A cough  can last 2 or 3 weeks (acute) or may last more than 8 weeks (chronic). Some common causes of a cough can include an infection, allergy, or a cold. HOME CARE  Only take medicine as told by your doctor.  If given, take your medicines (antibiotics) as told. Finish them even if you start to feel better.  Use a cold steam vaporizer or humidier in your home. This can help loosen thick spit (secretions).  Sleep so you are almost sitting up (semi-upright). Use pillows to do this. This helps reduce coughing.  Rest as needed.  Stop smoking if you smoke. GET HELP RIGHT AWAY IF:  You have yellowish-white fluid (pus) in your thick spit.  Your cough gets worse.  Your medicine does not reduce coughing, and you are losing sleep.  You cough up blood.  You have trouble breathing.  Your pain gets worse and medicine does not help.  You have a fever. MAKE SURE YOU:   Understand these instructions.  Will watch your condition.  Will get help right away if you are not doing well or get worse. Document Released: 07/04/2011 Document Revised: 01/13/2012 Document Reviewed: 07/04/2011 [employer] Patient  Information 2014 [place], [place]. Fever, Adult A fever is a temperature of 100.4 F (38 C) or above.  HOME CARE  Take fever medicine as told by your doctor. Do not  take aspirin for fever if you are younger than 57 years of age.  If you are given antibiotic medicine, take it as told. Finish the medicine even if you start to feel better.  Rest.  Drink enough fluids to keep your pee (urine) clear or pale yellow. Do not drink alcohol.  Take a bath or shower with room temperature water. Do not use ice water or alcohol sponge baths.  Wear lightweight, loose clothes. GET HELP RIGHT AWAY IF:   You are short of breath or have trouble breathing.  You are very weak.  You are dizzy or you pass out (faint).  You are very thirsty or are making little or no urine.  You have new pain.  You throw  up (vomit) or have watery poop (diarrhea).  You keep throwing up or having watery poop for more than 1 to 2 days.  You have a stiff neck or light bothers your eyes.  You have a skin rash.  You have a fever or problems (symptoms) that last for more than 2 to 3 days.  You have a fever and your problems quickly get worse.  You keep throwing up the fluids you drink.  You do not feel better after 3 days.  You have new problems. MAKE SURE YOU:   Understand these instructions.  Will watch your condition.  Will get help right away if you are not doing well or get worse. Document Released: 07/30/2008 Document Revised: 01/13/2012 Document Reviewed: 08/22/2011 [facility] Patient Information 2014 [place].

## 2014-01-03 ENCOUNTER — Encounter: Payer: Self-pay | Admitting: Emergency Medicine

## 2014-01-03 ENCOUNTER — Ambulatory Visit
Admission: RE | Admit: 2014-01-03 | Discharge: 2014-01-03 | Disposition: A | Discharge status: Home / Self Care | Payer: No Typology Code available for payment source | Source: Ambulatory Visit | Attending: Emergency Medicine | Admitting: Emergency Medicine

## 2014-01-03 ENCOUNTER — Ambulatory Visit (INDEPENDENT_AMBULATORY_CARE_PROVIDER_SITE_OTHER): Payer: No Typology Code available for payment source | Admitting: Emergency Medicine

## 2014-01-03 VITALS — BP 138/96 | HR 86 | Temp 98.0°F | Resp 20 | Ht 63.0 in | Wt 206.0 lb

## 2014-01-03 DIAGNOSIS — R059 Cough, unspecified: Secondary | ICD-10-CM

## 2014-01-03 DIAGNOSIS — R05 Cough: Secondary | ICD-10-CM

## 2014-01-03 DIAGNOSIS — J189 Pneumonia, unspecified organism: Secondary | ICD-10-CM

## 2014-01-03 DIAGNOSIS — J45901 Unspecified asthma with (acute) exacerbation: Secondary | ICD-10-CM

## 2014-01-03 IMAGING — CR DG CHEST 2V
2 series · 2 of 2 positions shown · non-contrast
Comparison: [DATE]

CLINICAL DATA: Cough and fever

EXAM:
CHEST  2 VIEW

[w chest pa]
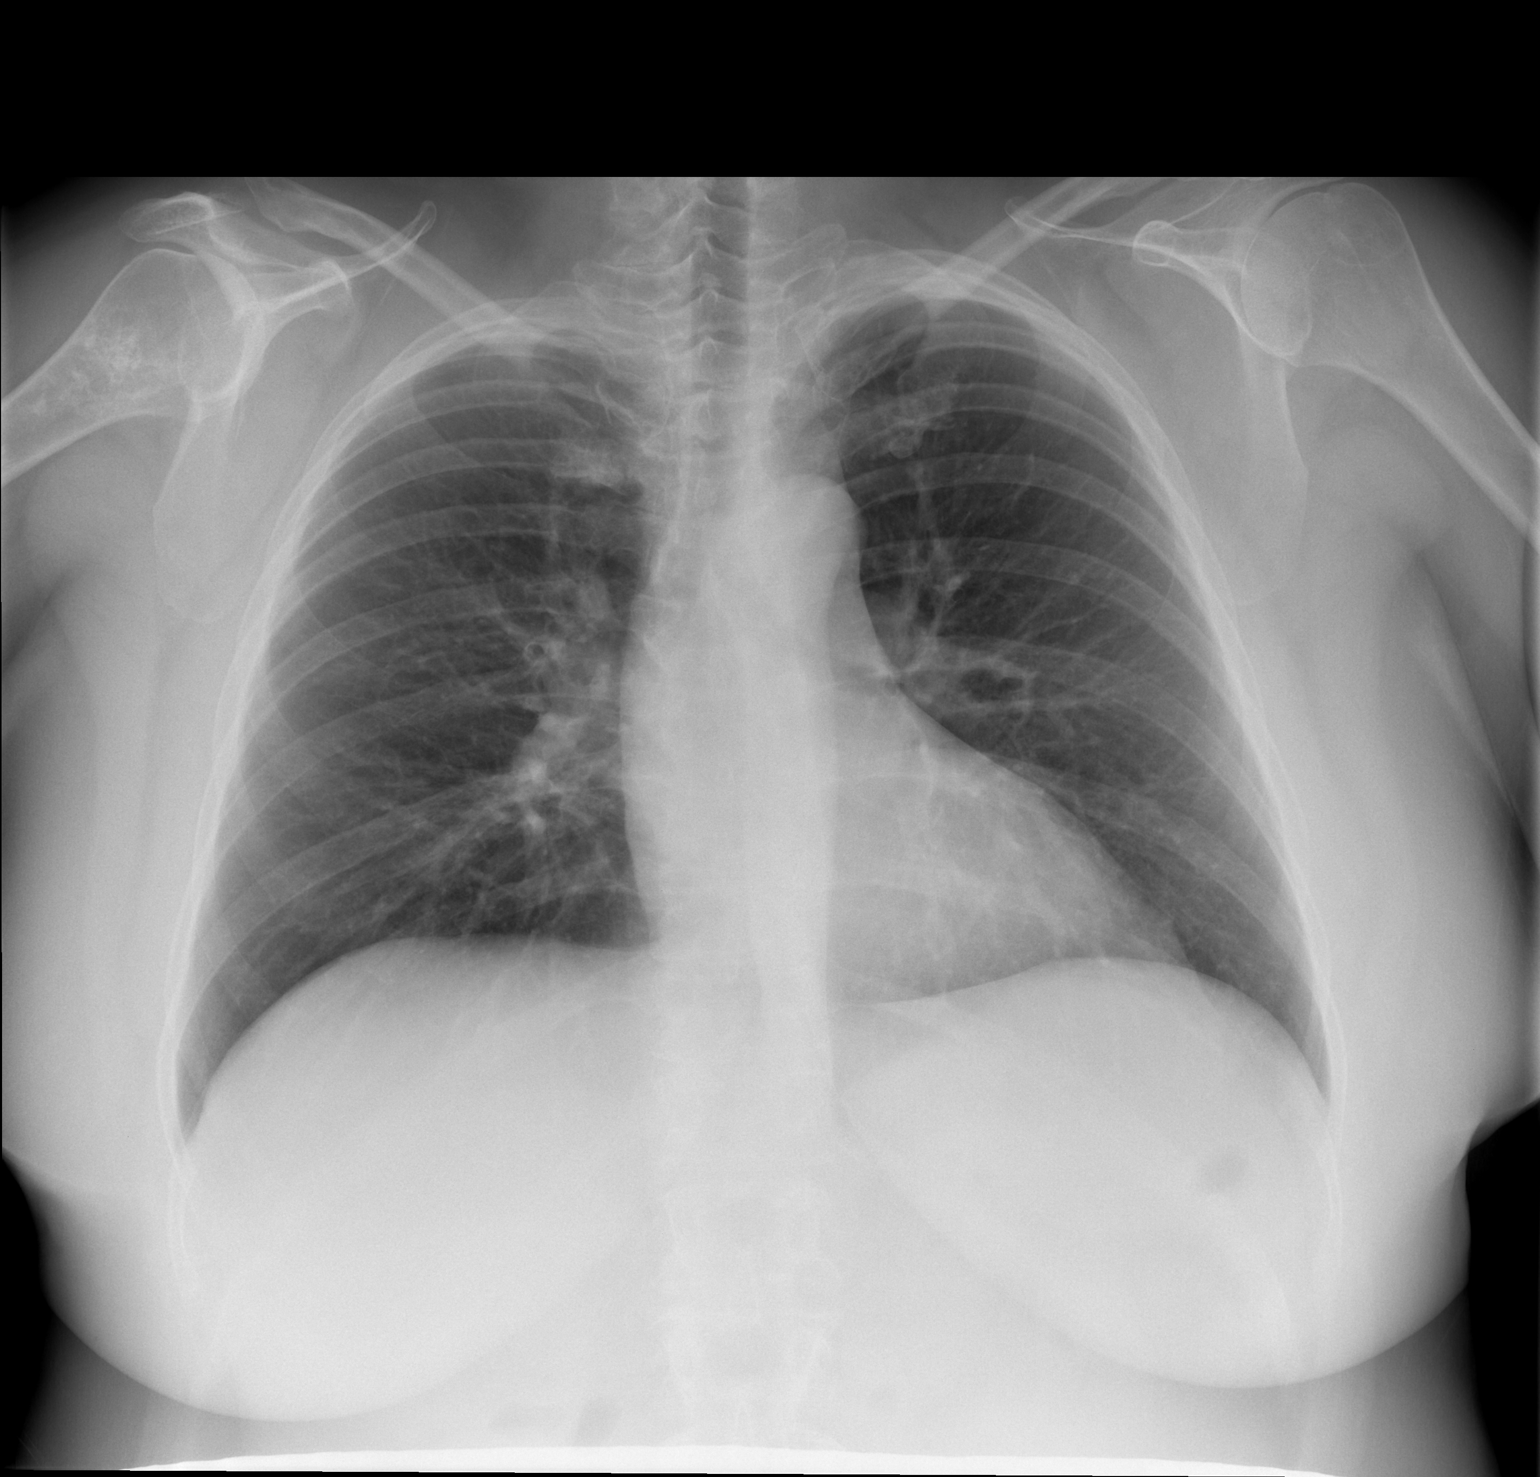

[w chest lat]
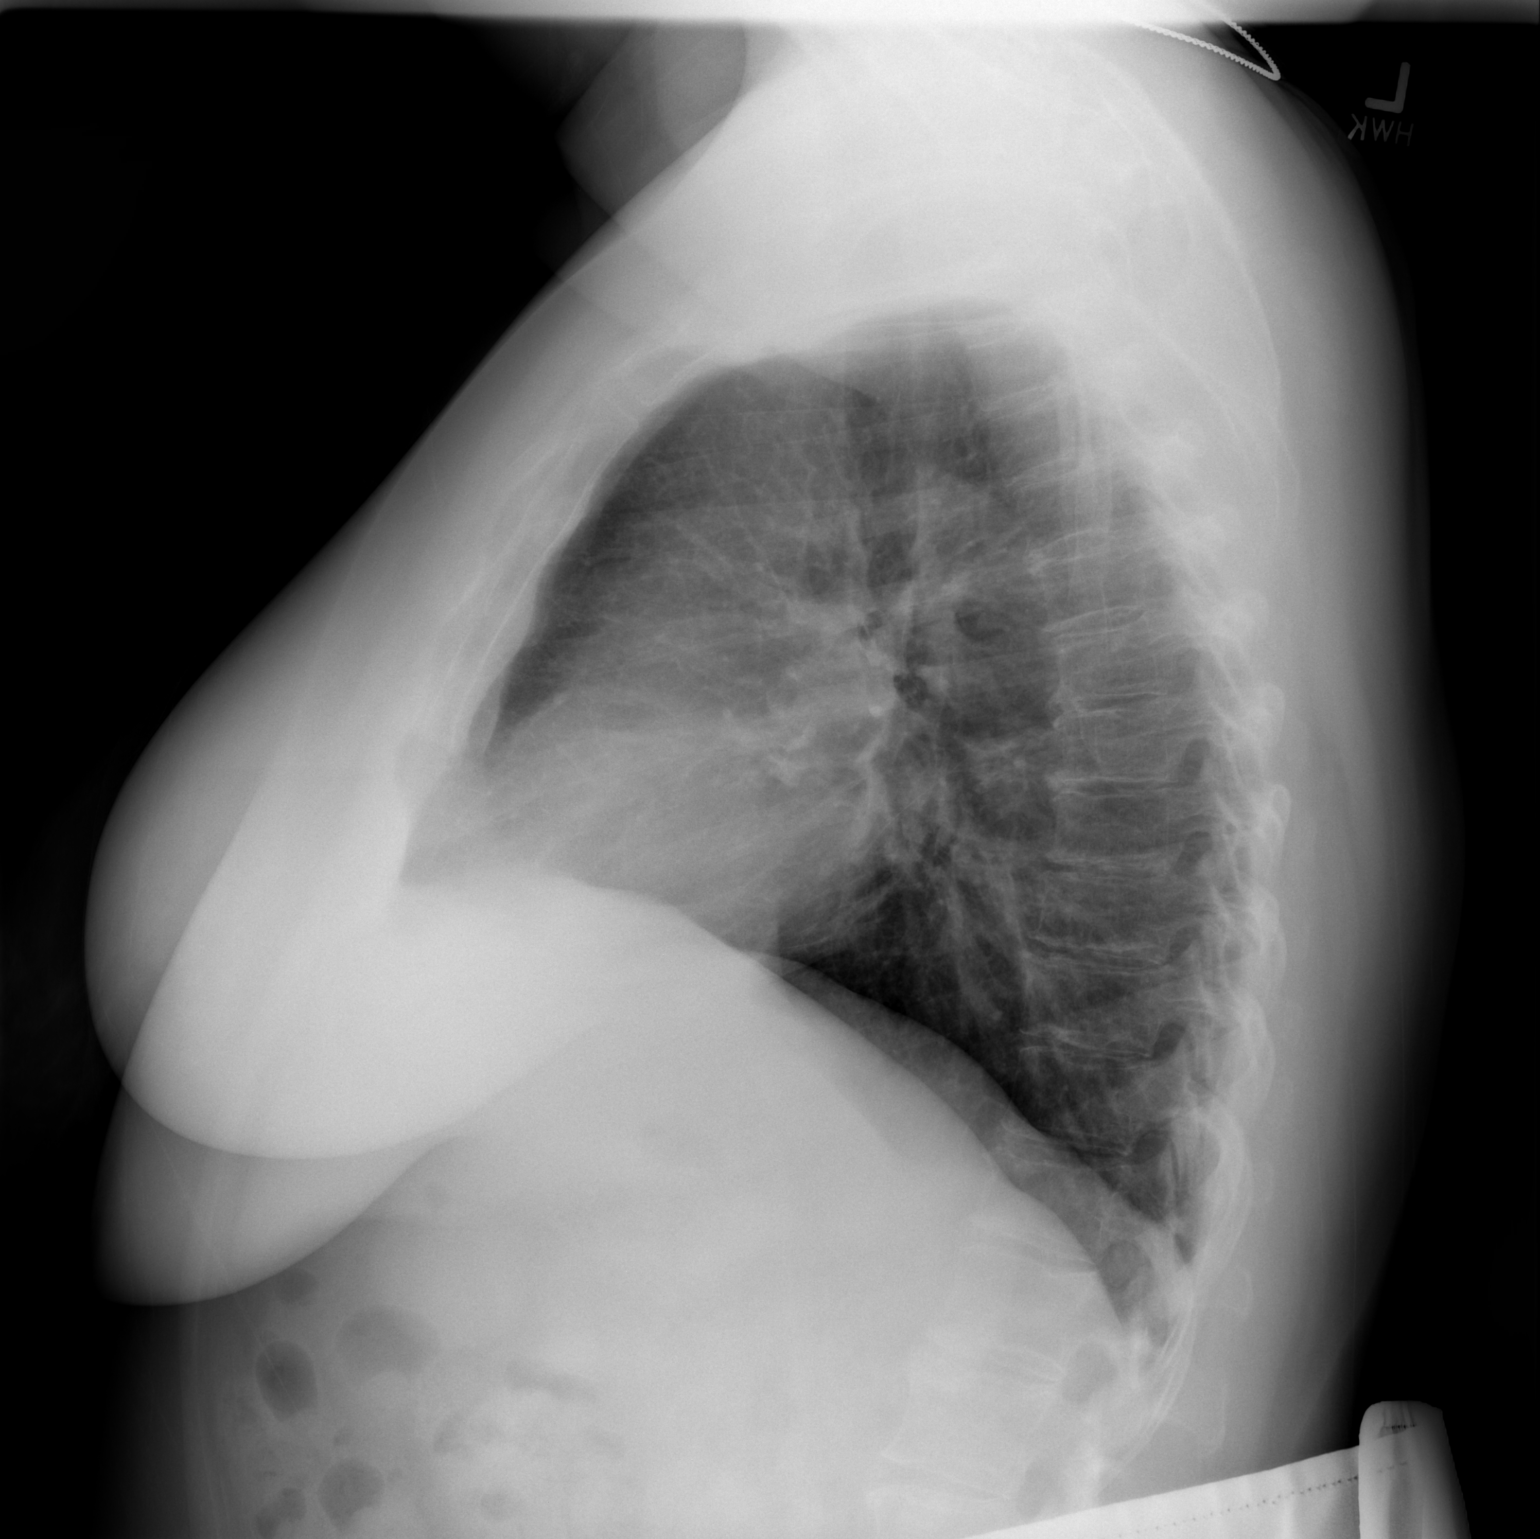

[2 of 2 positions shown; findings below may reference images not displayed]

FINDINGS: Improved lung volume compared with the prior study. Lungs are now
clear without infiltrate effusion or atelectasis. Negative for heart
failure.
IMPRESSION: No active cardiopulmonary disease.

## 2014-01-03 MED ORDER — IPRATROPIUM-ALBUTEROL 0.5-2.5 (3) MG/3ML IN SOLN
3.0000 mL | Freq: Once | RESPIRATORY_TRACT | Status: AC
  Administered 2014-01-03: 3 mL via RESPIRATORY_TRACT

## 2014-01-03 MED ORDER — AZITHROMYCIN 250 MG PO TABS: ORAL_TABLET | ORAL | Status: AC

## 2014-01-03 MED ORDER — IPRATROPIUM-ALBUTEROL 0.5-2.5 (3) MG/3ML IN SOLN: 3.0000 mL | RESPIRATORY_TRACT | Status: DC | PRN

## 2014-01-03 MED ORDER — CEFTRIAXONE SODIUM 1 G IJ SOLR
1.0000 g | Freq: Once | INTRAMUSCULAR | Status: AC
Start: 2014-01-03 — End: 2014-01-03
  Administered 2014-01-03: 1 g via INTRAMUSCULAR

## 2014-01-03 NOTE — Patient Instructions (Signed)
Dehydration, Adult Dehydration means your body does not have as much fluid as it needs. Your kidneys, brain, and heart will not work properly without the right amount of fluids and salt.  HOME CARE  Ask your doctor how to replace body fluid losses (rehydrate).  Drink enough fluids to keep your pee (urine) clear or pale yellow.  Drink small amounts of fluids often if you feel sick to your stomach (nauseous) or throw up (vomit).  Eat like you normally do.  Avoid:  Foods or drinks high in sugar.  Bubbly (carbonated) drinks.  Juice.  Very hot or cold fluids.  Drinks with caffeine.  Fatty, greasy foods.  Alcohol.  Tobacco.  Eating too much.  Gelatin desserts.  Wash your hands to avoid spreading germs (bacteria, viruses).  Only take medicine as told by your doctor.  Keep all doctor visits as told. GET HELP RIGHT AWAY IF:   You cannot drink something without throwing up.  You get worse even with treatment.  Your vomit has blood in it or looks greenish.  Your poop (stool) has blood in it or looks black and tarry.  You have not peed in 6 to 8 hours.  You pee a small amount of very dark pee.  You have a fever.  You pass out (faint).  You have belly (abdominal) pain that gets worse or stays in one spot (localizes).  You have a rash, stiff neck, or bad headache.  You get easily annoyed, sleepy, or are hard to wake up.  You feel weak, dizzy, or very thirsty. MAKE SURE YOU:   Understand these instructions.  Will watch your condition.  Will get help right away if you are not doing well or get worse. Document Released: 08/17/2009 Document Revised: 01/13/2012 Document Reviewed: 06/10/2011 [employer] Patient Information 2014 [employer]. Pneumonia, Adult Pneumonia is an infection of the lungs. It may be caused by a germ (virus or bacteria). Some types of pneumonia can spread easily from person to person. This can happen when you cough or sneeze. HOME  CARE  Only take medicine as told by your doctor.  Take your medicine (antibiotics) as told. Finish it even if you start to feel better.  Do not smoke.  You may use a vaporizer or humidifier in your room. This can help loosen thick spit (mucus).  Sleep so you are almost sitting up (semi-upright). This helps reduce coughing.  Rest. A shot (vaccine) can help prevent pneumonia. Shots are often advised for:  People over 65 years old.  Patients on chemotherapy.  People with long-term (chronic) lung problems.  People with immune system problems. GET HELP RIGHT AWAY IF:   You are getting worse.  You cannot control your cough, and you are losing sleep.  You cough up blood.  Your pain gets worse, even with medicine.  You have a fever.  Any of your problems are getting worse, not better.  You have shortness of breath or chest pain. MAKE SURE YOU:   Understand these instructions.  Will watch your condition.  Will get help right away if you are not doing well or get worse. Document Released: 04/08/2008 Document Revised: 01/13/2012 Document Reviewed: 01/11/2011 [employer] Patient Information 2014 [employer].  

## 2014-01-03 NOTE — Progress Notes (Signed)
Subjective:    Patient ID: Haley White, female    DOB: July 18, 1956, 58 y.o.   MRN: [id number]  HPI Comments: 58 yo female f/u with flu like symptoms. She is still very tired and has been taking Augmentin AD, she did not start Pred AD. She notes cough is hard and now back hurts from coughing. She notes a little blood with sputum but mostly green thick production. She is not taking any OTC. She is trying to push fluids  Cough    Current Outpatient Prescriptions on File Prior to Visit  Medication Sig Dispense Refill  . albuterol (PROVENTIL HFA;VENTOLIN HFA) 108 (90 BASE) MCG/ACT inhaler Inhale 2 puffs into the lungs every 6 (six) hours as needed. wheezing  8.5 g  2  . amoxicillin-clavulanate (AUGMENTIN) 875-125 MG per tablet Take 1 tablet by mouth 2 (two) times daily.  20 tablet  0  . Ascorbic Acid (VITAMIN C PO) Take 1 tablet by mouth daily.      . Calcium Carbonate-Vitamin D (CALCIUM + D PO) Take 1 tablet by mouth daily.      . chlorpheniramine-HYDROcodone (TUSSIONEX PENNKINETIC ER) 10-8 MG/5ML LQCR Take 5 mLs by mouth 2 (two) times daily.  115 mL  0  . FLUoxetine (PROZAC) 20 MG tablet Take 20 mg by mouth daily.      . Fluticasone-Salmeterol (ADVAIR DISKUS) 250-50 MCG/DOSE AEPB Inhale 1 puff into the lungs 2 (two) times daily.  60 each  0  . methylphenidate (RITALIN) 20 MG tablet Take 20 mg by mouth 2 (two) times daily.      . predniSONE (DELTASONE) 10 MG tablet 1 po TID x 3 days, 1 PO BID x 3 days, 1 po QD x 5 days  20 tablet  0   Current Facility-Administered Medications on File Prior to Visit  Medication Dose Route Frequency Provider Last Rate Last Dose  . albuterol (PROVENTIL) (2.5 MG/3ML) 0.083% nebulizer solution 2.5 mg  2.5 mg Nebulization Once Heather M Marte, PA-C      . ipratropium (ATROVENT) nebulizer solution 0.5 mg  0.5 mg Nebulization Once Heather M Marte, PA-C       Allergies  Allergen Reactions  . Biaxin [Clarithromycin]     GI upset  . Serevent [Salmeterol]    Palpitations  . Tequin [Gatifloxacin]     GI upset. thrush   Past Medical History  Diagnosis Date  . Depression   . Asthma   . Cataract   . Hypertension   . Anxiety   . Obese   . 250.00     not on med for diabetes  . Hyperlipidemia   . Arthritis      Review of Systems  Constitutional: Positive for fatigue.  HENT: Positive for congestion.   Respiratory: Positive for cough.   All other systems reviewed and are negative.   BP 138/96  Pulse 86  Temp(Src) 98 F (36.7 C) (Temporal)  Resp 20  Ht 5\' 3"  (1.6 m)  Wt 206 lb (93.441 kg)  BMI 36.50 kg/m2  LMP 11/26/2011     Objective:   Physical Exam  Nursing note and vitals reviewed. Constitutional: She is oriented to person, place, and time. She appears well-developed and well-nourished.  Appears ill  HENT:  Head: Normocephalic and atraumatic.  Right Ear: External ear normal.  Left Ear: External ear normal.  Nose: Nose normal.  Mouth/Throat: Oropharynx is clear and moist. No oropharyngeal exudate.  Cloudy TM's bilaterally   Eyes: Conjunctivae and EOM are normal.  Neck: Normal range of motion.  Cardiovascular: Normal rate, regular rhythm, normal heart sounds and intact distal pulses.   Pulmonary/Chest: Effort normal.  ? rhonchi  Musculoskeletal: Normal range of motion.  Lymphadenopathy:    She has no cervical adenopathy.  Neurological: She is alert and oriented to person, place, and time.  Skin: Skin is warm and dry.  Psychiatric: She has a normal mood and affect. Judgment normal.          Assessment & Plan:  Cough with Asthma HX with probable pneumonia- w/c if SX increase or ER. Duoneb given in office, restart Nebs at home TID sx 35 given, RX sent in. Rocephin 1 gm given in office, advised continue Augmentin AD and add ZPak AD. Advised to start Prednisone as previously directed. Push fluids OOW thru WED

## 2014-08-22 ENCOUNTER — Encounter: Payer: Self-pay | Admitting: Internal Medicine

## 2014-08-22 ENCOUNTER — Ambulatory Visit: Payer: Self-pay | Admitting: Internal Medicine

## 2014-08-22 DIAGNOSIS — E782 Mixed hyperlipidemia: Secondary | ICD-10-CM | POA: Insufficient documentation

## 2014-08-22 DIAGNOSIS — E559 Vitamin D deficiency, unspecified: Secondary | ICD-10-CM | POA: Insufficient documentation

## 2014-08-22 DIAGNOSIS — Z79899 Other long term (current) drug therapy: Secondary | ICD-10-CM | POA: Insufficient documentation

## 2014-08-22 DIAGNOSIS — E785 Hyperlipidemia, unspecified: Secondary | ICD-10-CM | POA: Insufficient documentation

## 2014-08-22 NOTE — Patient Instructions (Signed)
Recommend the book "The END of DIETING" by Dr Baker Janus   and the book "The END of DIABETES " by Dr Excell Seltzer  At [email or web address] - get book & Audio CD's      Being diabetic has a  300% increased risk for heart attack, stroke, cancer, and alzheimer- type vascular dementia. It is very important that you work harder with diet by avoiding all foods that are white except chicken & fish. Avoid white rice (brown & wild rice is OK), white potatoes (sweetpotatoes in moderation is OK), White bread or wheat bread or anything made out of white flour like bagels, donuts, rolls, buns, biscuits, cakes, pastries, cookies, pizza crust, and pasta (made from white flour & egg whites) - vegetarian pasta or spinach or wheat pasta is OK. Multigrain breads like Arnold's or [employer], or multigrain sandwich thins or flatbreads.  Diet, exercise and weight loss can reverse and cure diabetes in the early stages.  Diet, exercise and weight loss is very important in the control and prevention of complications of diabetes which affects every system in your body, ie. Brain - dementia/stroke, eyes - glaucoma/blindness, heart - heart attack/heart failure, kidneys - dialysis, stomach - gastric paralysis, intestines - malabsorption, nerves - severe painful neuritis, circulation - gangrene & loss of a leg(s), and finally cancer and Alzheimers.    I recommend avoid fried & greasy foods,  sweets/candy, white rice (brown or wild rice or Quinoa is OK), white potatoes (sweet potatoes are OK) - anything made from white flour - bagels, doughnuts, rolls, buns, biscuits,white and wheat breads, pizza crust and traditional pasta made of white flour & egg white(vegetarian pasta or spinach or wheat pasta is OK).  Multi-grain bread is OK - like multi-grain flat bread or sandwich thins. Avoid alcohol in excess. Exercise is also important.    Eat all the vegetables you want - avoid meat, especially red meat and dairy - especially cheese.  Cheese  is the most concentrated form of trans-fats which is the worst thing to clog up our arteries. Veggie cheese is OK which can be found in the fresh produce section at [employer] or [employer] or [employer]  Preventive Care for Adults A healthy lifestyle and preventive care can promote health and wellness. Preventive health guidelines for women include the following key practices.  A routine yearly physical is a good way to check with your health care provider about your health and preventive screening. It is a chance to share any concerns and updates on your health and to receive a thorough exam.  Visit your dentist for a routine exam and preventive care every 6 months. Brush your teeth twice a day and floss once a day. Good oral hygiene prevents tooth decay and gum disease.  The frequency of eye exams is based on your age, health, family medical history, use of contact lenses, and other factors. Follow your health care provider's recommendations for frequency of eye exams.  Eat a healthy diet. Foods like vegetables, fruits, whole grains, low-fat dairy products, and lean protein foods contain the nutrients you need without too many calories. Decrease your intake of foods high in solid fats, added sugars, and salt. Eat the right amount of calories for you.Get information about a proper diet from your health care provider, if necessary.  Regular physical exercise is one of the most important things you can do for your health. Most adults should get at least 150 minutes of moderate-intensity exercise (any activity that increases  your heart rate and causes you to sweat) each week. In addition, most adults need muscle-strengthening exercises on 2 or more days a week.  Maintain a healthy weight. The body mass index (BMI) is a screening tool to identify possible weight problems. It provides an estimate of body fat based on height and weight. Your health care provider can find your BMI and can help you  achieve or maintain a healthy weight.For adults 20 years and older:  A BMI below 18.5 is considered underweight.  A BMI of 18.5 to 24.9 is normal.  A BMI of 25 to 29.9 is considered overweight.  A BMI of 30 and above is considered obese.  Maintain normal blood lipids and cholesterol levels by exercising and minimizing your intake of saturated fat. Eat a balanced diet with plenty of fruit and vegetables. Blood tests for lipids and cholesterol should begin at age 38 and be repeated every 5 years. If your lipid or cholesterol levels are high, you are over 50, or you are at high risk for heart disease, you may need your cholesterol levels checked more frequently.Ongoing high lipid and cholesterol levels should be treated with medicines if diet and exercise are not working.  If you smoke, find out from your health care provider how to quit. If you do not use tobacco, do not start.  Lung cancer screening is recommended for adults aged 64-80 years who are at high risk for developing lung cancer because of a history of smoking. A yearly low-dose CT scan of the lungs is recommended for people who have at least a 30-pack-year history of smoking and are a current smoker or have quit within the past 15 years. A pack year of smoking is smoking an average of 1 pack of cigarettes a day for 1 year (for example: 1 pack a day for 30 years or 2 packs a day for 15 years). Yearly screening should continue until the smoker has stopped smoking for at least 15 years. Yearly screening should be stopped for people who develop a health problem that would prevent them from having lung cancer treatment.  High blood pressure causes heart disease and increases the risk of stroke. Your blood pressure should be checked at least every 1 to 2 years. Ongoing high blood pressure should be treated with medicines if weight loss and exercise do not work.  If you are 79-57 years old, ask your health care provider if you should take  aspirin to prevent strokes.  Diabetes screening involves taking a blood sample to check your fasting blood sugar level. This should be done once every 3 years, after age 37, if you are within normal weight and without risk factors for diabetes. Testing should be considered at a younger age or be carried out more frequently if you are overweight and have at least 1 risk factor for diabetes.  Breast cancer screening is essential preventive care for women. You should practice "breast self-awareness." This means understanding the normal appearance and feel of your breasts and may include breast self-examination. Any changes detected, no matter how small, should be reported to a health care provider. Women in their 76s and 30s should have a clinical breast exam (CBE) by a health care provider as part of a regular health exam every 1 to 3 years. After age 55, women should have a CBE every year. Starting at age 79, women should consider having a mammogram (breast X-ray test) every year. Women who have a family history of breast  cancer should talk to their health care provider about genetic screening. Women at a high risk of breast cancer should talk to their health care providers about having an MRI and a mammogram every year.  Breast cancer gene (BRCA)-related cancer risk assessment is recommended for women who have family members with BRCA-related cancers. BRCA-related cancers include breast, ovarian, tubal, and peritoneal cancers. Having family members with these cancers may be associated with an increased risk for harmful changes (mutations) in the breast cancer genes BRCA1 and BRCA2. Results of the assessment will determine the need for genetic counseling and BRCA1 and BRCA2 testing.  Routine pelvic exams to screen for cancer are no longer recommended for nonpregnant women who are considered low risk for cancer of the pelvic organs (ovaries, uterus, and vagina) and who do not have symptoms. Ask your health  care provider if a screening pelvic exam is right for you.  If you have had past treatment for cervical cancer or a condition that could lead to cancer, you need Pap tests and screening for cancer for at least 20 years after your treatment. If Pap tests have been discontinued, your risk factors (such as having a new sexual partner) need to be reassessed to determine if screening should be resumed. Some women have medical problems that increase the chance of getting cervical cancer. In these cases, your health care provider may recommend more frequent screening and Pap tests.  Colorectal cancer can be detected and often prevented. Most routine colorectal cancer screening begins at the age of 58 years and continues through age 44 years. However, your health care provider may recommend screening at an earlier age if you have risk factors for colon cancer. On a yearly basis, your health care provider may provide home test kits to check for hidden blood in the stool. Use of a small camera at the end of a tube, to directly examine the colon (sigmoidoscopy or colonoscopy), can detect the earliest forms of colorectal cancer. Talk to your health care provider about this at age 84, when routine screening begins. Direct exam of the colon should be repeated every 5-10 years through age 33 years, unless early forms of pre-cancerous polyps or small growths are found.  Hepatitis C blood testing is recommended for all people born from [birth year] through 1965 and any individual with known risks for hepatitis C.  Pra  Osteoporosis is a disease in which the bones lose minerals and strength with aging. This can result in serious bone fractures or breaks. The risk of osteoporosis can be identified using a bone density scan. Women ages 36 years and over and women at risk for fractures or osteoporosis should discuss screening with their health care providers. Ask your health care provider whether you should take a calcium supplement  or vitamin D to reduce the rate of osteoporosis.  Menopause can be associated with physical symptoms and risks. Hormone replacement therapy is available to decrease symptoms and risks. You should talk to your health care provider about whether hormone replacement therapy is right for you.  Use sunscreen. Apply sunscreen liberally and repeatedly throughout the day. You should seek shade when your shadow is shorter than you. Protect yourself by wearing long sleeves, pants, a wide-brimmed hat, and sunglasses year round, whenever you are outdoors.  Once a month, do a whole body skin exam, using a mirror to look at the skin on your back. Tell your health care provider of new moles, moles that have irregular borders, moles that are  larger than a pencil eraser, or moles that have changed in shape or color.  Stay current with required vaccines (immunizations).  Influenza vaccine. All adults should be immunized every year.  Tetanus, diphtheria, and acellular pertussis (Td, Tdap) vaccine. Pregnant women should receive 1 dose of Tdap vaccine during each pregnancy. The dose should be obtained regardless of the length of time since the last dose. Immunization is preferred during the 27th-36th week of gestation. An adult who has not previously received Tdap or who does not know her vaccine status should receive 1 dose of Tdap. This initial dose should be followed by tetanus and diphtheria toxoids (Td) booster doses every 10 years. Adults with an unknown or incomplete history of completing a 3-dose immunization series with Td-containing vaccines should begin or complete a primary immunization series including a Tdap dose. Adults should receive a Td booster every 10 years.  Varicella vaccine. An adult without evidence of immunity to varicella should receive 2 doses or a second dose if she has previously received 1 dose. Pregnant females who do not have evidence of immunity should receive the first dose after  pregnancy. This first dose should be obtained before leaving the health care facility. The second dose should be obtained 4-8 weeks after the first dose.  Human papillomavirus (HPV) vaccine. Females aged 13-26 years who have not received the vaccine previously should obtain the 3-dose series. The vaccine is not recommended for use in pregnant females. However, pregnancy testing is not needed before receiving a dose. If a female is found to be pregnant after receiving a dose, no treatment is needed. In that case, the remaining doses should be delayed until after the pregnancy. Immunization is recommended for any person with an immunocompromised condition through the age of 26 years if she did not get any or all doses earlier. During the 3-dose series, the second dose should be obtained 4-8 weeks after the first dose. The third dose should be obtained 24 weeks after the first dose and 16 weeks after the second dose.  Zoster vaccine. One dose is recommended for adults aged 60 years or older unless certain conditions are present.  Measles, mumps, and rubella (MMR) vaccine. Adults born before 1957 generally are considered immune to measles and mumps. Adults born in 1957 or later should have 1 or more doses of MMR vaccine unless there is a contraindication to the vaccine or there is laboratory evidence of immunity to each of the three diseases. A routine second dose of MMR vaccine should be obtained at least 28 days after the first dose for students attending postsecondary schools, health care workers, or international travelers. People who received inactivated measles vaccine or an unknown type of measles vaccine during 1963-1967 should receive 2 doses of MMR vaccine. People who received inactivated mumps vaccine or an unknown type of mumps vaccine before 1979 and are at high risk for mumps infection should consider immunization with 2 doses of MMR vaccine. For females of childbearing age, rubella immunity should  be determined. If there is no evidence of immunity, females who are not pregnant should be vaccinated. If there is no evidence of immunity, females who are pregnant should delay immunization until after pregnancy. Unvaccinated health care workers born before 1957 who lack laboratory evidence of measles, mumps, or rubella immunity or laboratory confirmation of disease should consider measles and mumps immunization with 2 doses of MMR vaccine or rubella immunization with 1 dose of MMR vaccine.  Pneumococcal 13-valent conjugate (PCV13) vaccine. When   indicated, a person who is uncertain of her immunization history and has no record of immunization should receive the PCV13 vaccine. An adult aged 21 years or older who has certain medical conditions and has not been previously immunized should receive 1 dose of PCV13 vaccine. This PCV13 should be followed with a dose of pneumococcal polysaccharide (PPSV23) vaccine. The PPSV23 vaccine dose should be obtained at least 8 weeks after the dose of PCV13 vaccine. An adult aged 55 years or older who has certain medical conditions and previously received 1 or more doses of PPSV23 vaccine should receive 1 dose of PCV13. The PCV13 vaccine dose should be obtained 1 or more years after the last PPSV23 vaccine dose.    Pneumococcal polysaccharide (PPSV23) vaccine. When PCV13 is also indicated, PCV13 should be obtained first. All adults aged 47 years and older should be immunized. An adult younger than age 54 years who has certain medical conditions should be immunized. Any person who resides in a nursing home or long-term care facility should be immunized. An adult smoker should be immunized. People with an immunocompromised condition and certain other conditions should receive both PCV13 and PPSV23 vaccines. People with human immunodeficiency virus (HIV) infection should be immunized as soon as possible after diagnosis. Immunization during chemotherapy or radiation therapy should  be avoided. Routine use of PPSV23 vaccine is not recommended for American Indians, [place] Natives, or people younger than 65 years unless there are medical conditions that require PPSV23 vaccine. When indicated, people who have unknown immunization and have no record of immunization should receive PPSV23 vaccine. One-time revaccination 5 years after the first dose of PPSV23 is recommended for people aged 19-64 years who have chronic kidney failure, nephrotic syndrome, asplenia, or immunocompromised conditions. People who received 1-2 doses of PPSV23 before age 15 years should receive another dose of PPSV23 vaccine at age 48 years or later if at least 5 years have passed since the previous dose. Doses of PPSV23 are not needed for people immunized with PPSV23 at or after age 5 years.  Preventive Services / Frequency   Ages 22 to 77 years  Blood pressure check.** / Every 1 to 2 years.  Lipid and cholesterol check.** / Every 5 years beginning at age 41 years.  Lung cancer screening. / Every year if you are aged 41-80 years and have a 30-pack-year history of smoking and currently smoke or have quit within the past 15 years. Yearly screening is stopped once you have quit smoking for at least 15 years or develop a health problem that would prevent you from having lung cancer treatment.  Clinical breast exam.** / Every year after age 11 years.  BRCA-related cancer risk assessment.** / For women who have family members with a BRCA-related cancer (breast, ovarian, tubal, or peritoneal cancers).  Mammogram.** / Every year beginning at age 14 years and continuing for as long as you are in good health. Consult with your health care provider.  Pap test.** / Every 3 years starting at age 41 years through age 23 or 62 years with a history of 3 consecutive normal Pap tests.  HPV screening.** / Every 3 years from ages 31 years through ages 85 to 74 years with a history of 3 consecutive normal Pap  tests.  Fecal occult blood test (FOBT) of stool. / Every year beginning at age 33 years and continuing until age 37 years. You may not need to do this test if you get a colonoscopy every 10 years.  Flexible  sigmoidoscopy or colonoscopy.** / Every 5 years for a flexible sigmoidoscopy or every 10 years for a colonoscopy beginning at age 50 years and continuing until age 75 years.  Hepatitis C blood test.** / For all people born from 1945 through 1965 and any individual with known risks for hepatitis C.  Skin self-exam. / Monthly.  Influenza vaccine. / Every year.  Tetanus, diphtheria, and acellular pertussis (Tdap/Td) vaccine.** / Consult your health care provider. Pregnant women should receive 1 dose of Tdap vaccine during each pregnancy. 1 dose of Td every 10 years.  Varicella vaccine.** / Consult your health care provider. Pregnant females who do not have evidence of immunity should receive the first dose after pregnancy.  Zoster vaccine.** / 1 dose for adults aged 60 years or older.  Pneumococcal 13-valent conjugate (PCV13) vaccine.** / Consult your health care provider.  Pneumococcal polysaccharide (PPSV23) vaccine.** / 1 to 2 doses if you smoke cigarettes or if you have certain conditions.  Meningococcal vaccine.** / Consult your health care provider.  Hepatitis A vaccine.** / Consult your health care provider.  Hepatitis B vaccine.** / Consult your health care provider.  

## 2014-08-22 NOTE — Progress Notes (Signed)
Patient ID: Haley White, female   DOB: 06/16/1956, 58 y.o.   MRN: [id number] R E [facility]

## 2014-09-22 ENCOUNTER — Encounter: Payer: Self-pay | Admitting: Internal Medicine

## 2015-01-30 ENCOUNTER — Ambulatory Visit (INDEPENDENT_AMBULATORY_CARE_PROVIDER_SITE_OTHER): Payer: No Typology Code available for payment source | Admitting: *Deleted

## 2015-01-30 DIAGNOSIS — Z23 Encounter for immunization: Secondary | ICD-10-CM

## 2015-01-30 NOTE — Progress Notes (Signed)
Patient ID: Haley White, female   DOB: Jun 22, 1956, 59 y.o.   MRN: [id number] Patient came to office to have a PPD applied for new job.  PPD 0.1 ml applied to left forearm lot # [id number] exp. Date 02/03/2016.

## 2015-02-25 ENCOUNTER — Emergency Department (INDEPENDENT_AMBULATORY_CARE_PROVIDER_SITE_OTHER): Payer: No Typology Code available for payment source

## 2015-02-25 ENCOUNTER — Encounter (HOSPITAL_COMMUNITY): Payer: Self-pay | Admitting: Emergency Medicine

## 2015-02-25 ENCOUNTER — Emergency Department (HOSPITAL_COMMUNITY)
Admission: EM | Admit: 2015-02-25 | Discharge: 2015-02-25 | Disposition: A | Discharge status: Home / Self Care | Payer: No Typology Code available for payment source | Source: Home / Self Care | Attending: Family Medicine | Admitting: Family Medicine

## 2015-02-25 DIAGNOSIS — M791 Myalgia, unspecified site: Secondary | ICD-10-CM

## 2015-02-25 DIAGNOSIS — S40012A Contusion of left shoulder, initial encounter: Secondary | ICD-10-CM

## 2015-02-25 IMAGING — DX DG SHOULDER 2+V*L*
3 series · 3 of 3 positions shown · non-contrast
Comparison: None

CLINICAL DATA: Blunt trauma to shoulder.  Pain

EXAM:
LEFT SHOULDER - 2+ VIEW

[shoulder grashey]
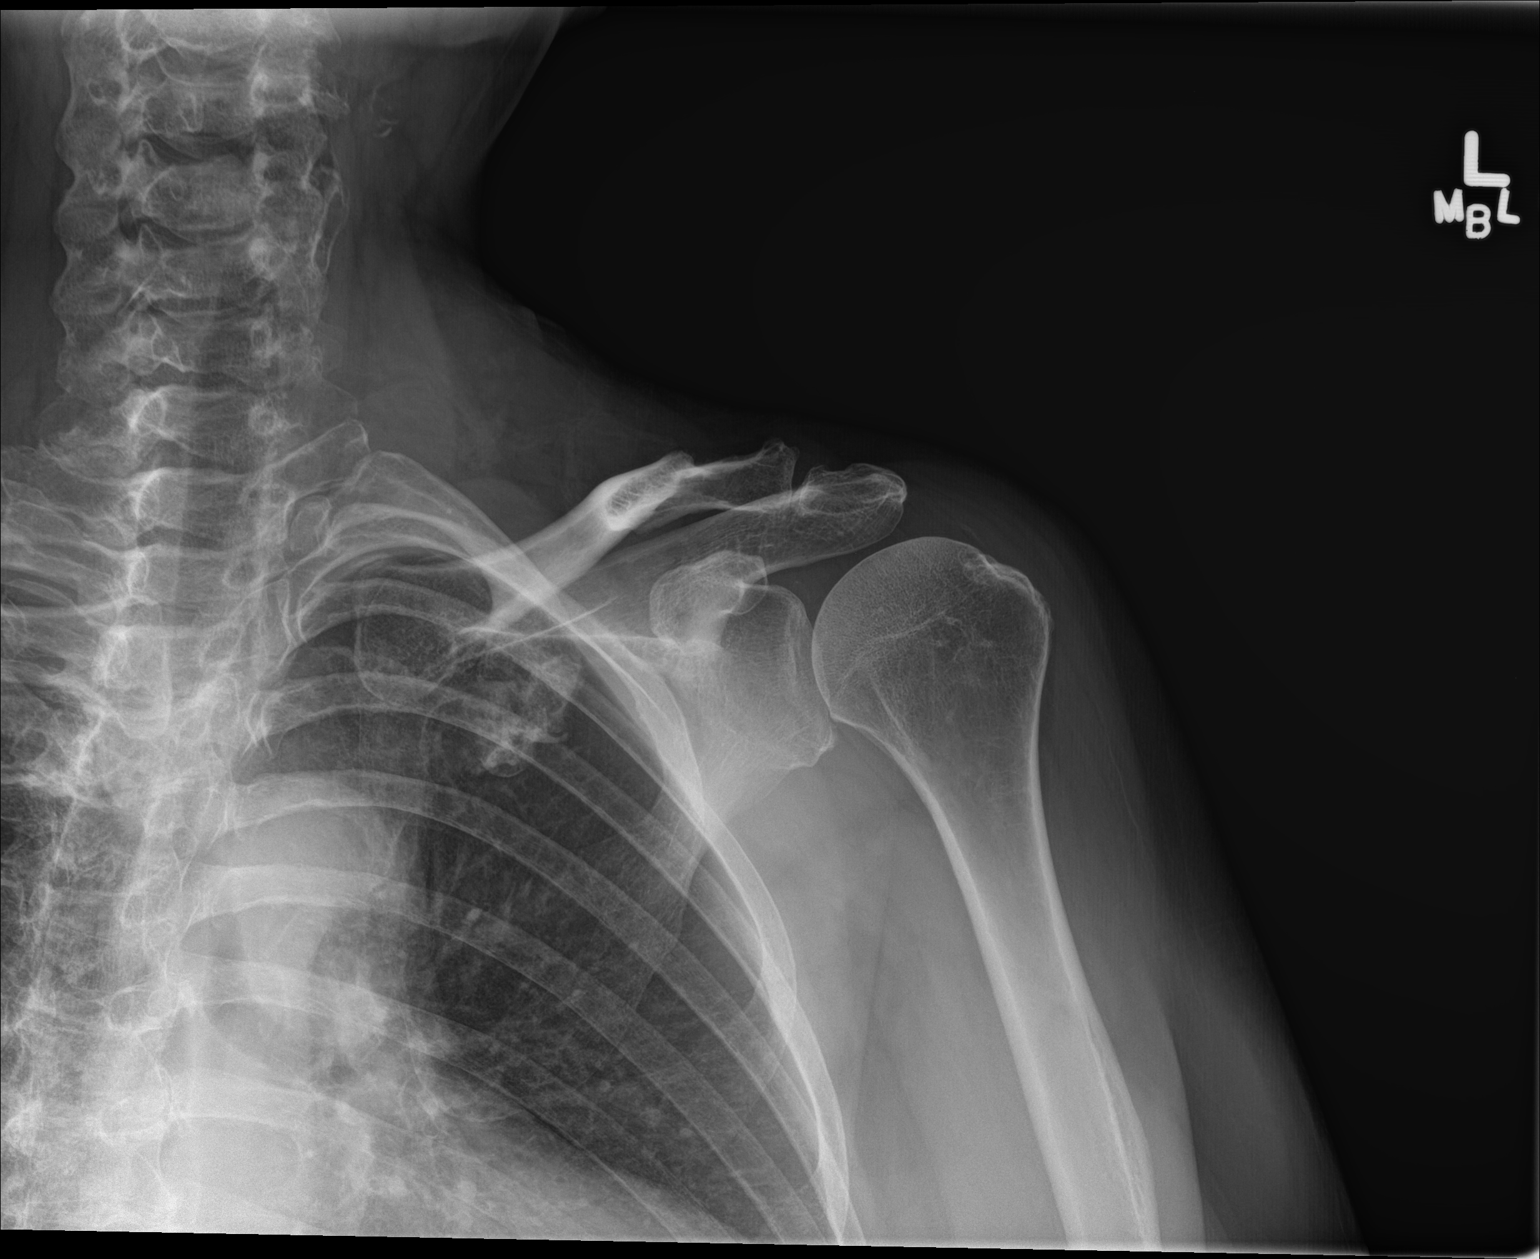

[shoulder y view]
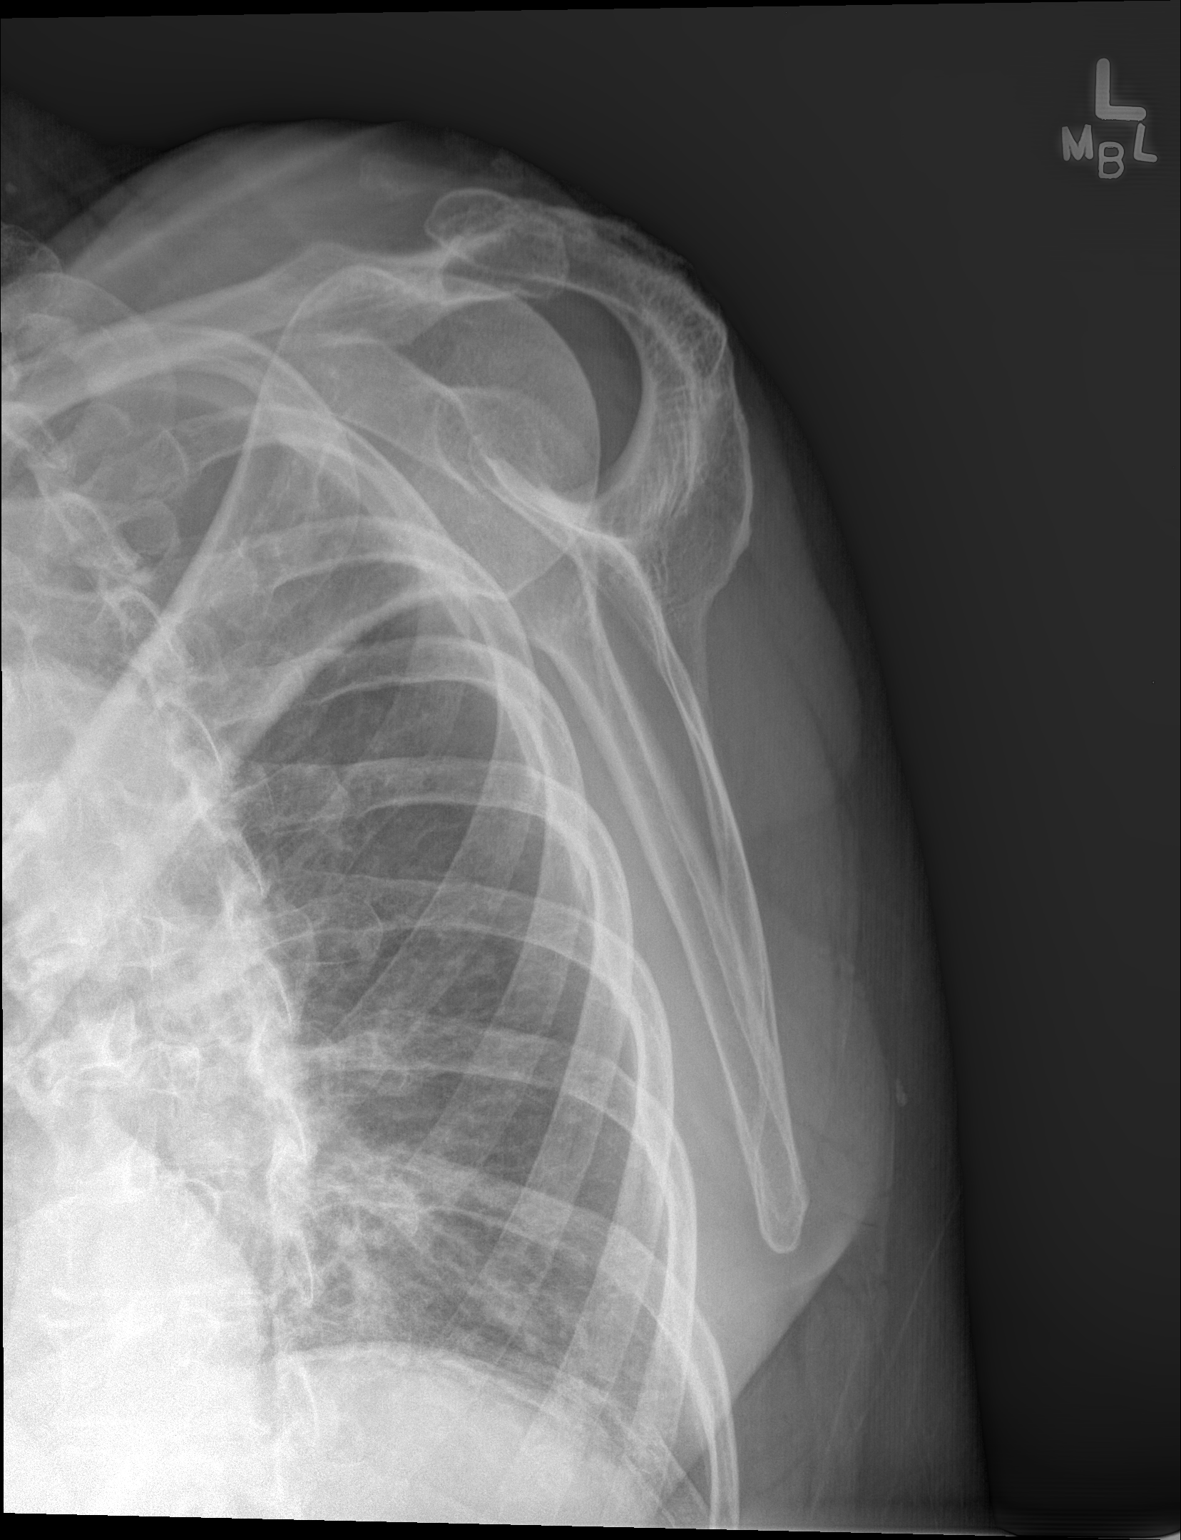

[shoulder ap]
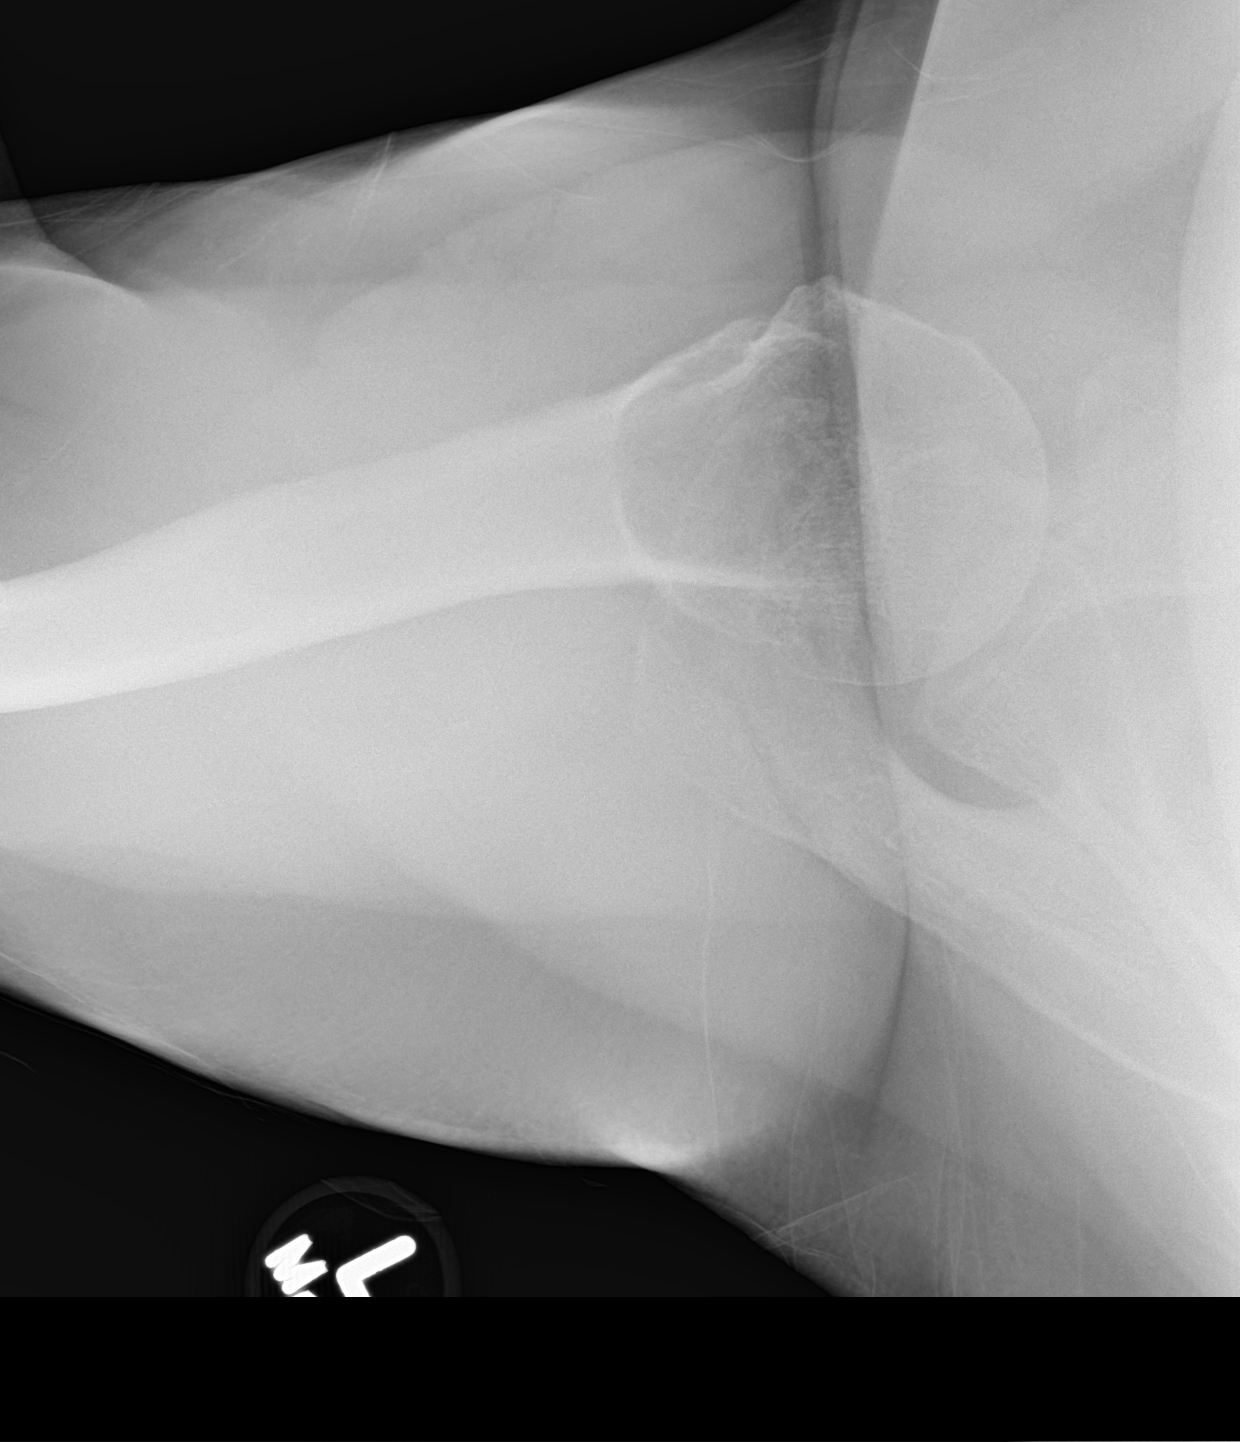

[3 of 3 positions shown; findings below may reference images not displayed]

FINDINGS: Deformity of the clavicle which appears chronic. Correlate with
history and pain in this area. Glenohumeral joint normal. Mild
degenerative change in the AC joint.
IMPRESSION: Deformity of the mid to distal clavicle which appears to be a healed
fracture. No acute fracture identified.

## 2015-02-25 MED ORDER — DICLOFENAC SODIUM 1 % TD GEL: 1.0000 "application " | Freq: Four times a day (QID) | TRANSDERMAL | Status: DC

## 2015-02-25 MED ORDER — TRAMADOL HCL 50 MG PO TABS: 50.0000 mg | ORAL_TABLET | Freq: Four times a day (QID) | ORAL | Status: DC | PRN

## 2015-02-25 MED ORDER — DICLOFENAC POTASSIUM 50 MG PO TABS: ORAL_TABLET | ORAL | Status: DC

## 2015-02-25 NOTE — Discharge Instructions (Signed)
Blunt Trauma You have been evaluated for injuries. You have been examined and your caregiver has not found injuries serious enough to require hospitalization. It is common to have multiple bruises and sore muscles following an accident. These tend to feel worse for the first 24 hours. You will feel more stiffness and soreness over the next several hours and worse when you wake up the first morning after your accident. After this point, you should begin to improve with each passing day. The amount of improvement depends on the amount of damage done in the accident. Following your accident, if some part of your body does not work as it should, or if the pain in any area continues to increase, you should return to the Emergency Department for re-evaluation.  HOME CARE INSTRUCTIONS  Routine care for sore areas should include:  Ice to sore areas every 2 hours for 20 minutes while awake for the next 2 days.  Drink extra fluids (not alcohol).  Take a hot or warm shower or bath once or twice a day to increase blood flow to sore muscles. This will help you "limber up".  Activity as tolerated. Lifting may aggravate neck or back pain.  Only take over-the-counter or prescription medicines for pain, discomfort, or fever as directed by your caregiver. Do not use aspirin. This may increase bruising or increase bleeding if there are small areas where this is happening. SEEK IMMEDIATE MEDICAL CARE IF:  Numbness, tingling, weakness, or problem with the use of your arms or legs.  A severe headache is not relieved with medications.  There is a change in bowel or bladder control.  Increasing pain in any areas of the body.  Short of breath or dizzy.  Nauseated, vomiting, or sweating.  Increasing belly (abdominal) discomfort.  Blood in urine, stool, or vomiting blood.  Pain in either shoulder in an area where a shoulder strap would be.  Feelings of lightheadedness or if you have a fainting  episode. Sometimes it is not possible to identify all injuries immediately after the trauma. It is important that you continue to monitor your condition after the emergency department visit. If you feel you are not improving, or improving more slowly than should be expected, call your physician. If you feel your symptoms (problems) are worsening, return to the Emergency Department immediately. Document Released: 07/17/2001 Document Revised: 01/13/2012 Document Reviewed: 06/08/2008 [facility] Patient Information 2015 [place], [place]. This information is not intended to replace advice given to you by your health care provider. Make sure you discuss any questions you have with your health care provider.  Contusion A contusion is a deep bruise. Contusions happen when an injury causes bleeding under the skin. Signs of bruising include pain, puffiness (swelling), and discolored skin. The contusion may turn blue, purple, or yellow. HOME CARE   Put ice on the injured area.  Put ice in a plastic bag.  Place a towel between your skin and the bag.  Leave the ice on for 15-20 minutes, 03-04 times a day.  Only take medicine as told by your doctor.  Rest the injured area.  If possible, raise (elevate) the injured area to lessen puffiness. GET HELP RIGHT AWAY IF:   You have more bruising or puffiness.  You have pain that is getting worse.  Your puffiness or pain is not helped by medicine. MAKE SURE YOU:   Understand these instructions.  Will watch your condition.  Will get help right away if you are not doing well or  get worse. Document Released: 04/08/2008 Document Revised: 01/13/2012 Document Reviewed: 08/26/2011 [facility] Patient Information 2015 [place], [place]. This information is not intended to replace advice given to you by your health care provider. Make sure you discuss any questions you have with your health care provider.  Muscle Pain Muscle pain (myalgia) may be caused by many  things, including:  Overuse or muscle strain, especially if you are not in shape. This is the most common cause of muscle pain.  Injury.  Bruises.  Viruses, such as the flu.  Infectious diseases.  Fibromyalgia, which is a chronic condition that causes muscle tenderness, fatigue, and headache.  Autoimmune diseases, including lupus.  Certain drugs, including ACE inhibitors and statins. Muscle pain may be mild or severe. In most cases, the pain lasts only a short time and goes away without treatment. To diagnose the cause of your muscle pain, your health care provider will take your medical history. This means he or she will ask you when your muscle pain began and what has been happening. If you have not had muscle pain for very long, your health care provider may want to wait before doing much testing. If your muscle pain has lasted a long time, your health care provider may want to run tests right away. If your health care provider thinks your muscle pain may be caused by illness, you may need to have additional tests to rule out certain conditions.  Treatment for muscle pain depends on the cause. Home care is often enough to relieve muscle pain. Your health care provider may also prescribe anti-inflammatory medicine. HOME CARE INSTRUCTIONS Watch your condition for any changes. The following actions may help to lessen any discomfort you are feeling:  Only take over-the-counter or prescription medicines as directed by your health care provider.  Apply ice to the sore muscle:  Put ice in a plastic bag.  Place a towel between your skin and the bag.  Leave the ice on for 15-20 minutes, 3-4 times a day.  You may alternate applying hot and cold packs to the muscle as directed by your health care provider.  If overuse is causing your muscle pain, slow down your activities until the pain goes away.  Remember that it is normal to feel some muscle pain after starting a workout program.  Muscles that have not been used often will be sore at first.  Do regular, gentle exercises if you are not usually active.  Warm up before exercising to lower your risk of muscle pain.  Do not continue working out if the pain is very bad. Bad pain could mean you have injured a muscle. SEEK MEDICAL CARE IF:  Your muscle pain gets worse, and medicines do not help.  You have muscle pain that lasts longer than 3 days.  You have a rash or fever along with muscle pain.  You have muscle pain after a tick bite.  You have muscle pain while working out, even though you are in good physical condition.  You have redness, soreness, or swelling along with muscle pain.  You have muscle pain after starting a new medicine or changing the dose of a medicine. SEEK IMMEDIATE MEDICAL CARE IF:  You have trouble breathing.  You have trouble swallowing.  You have muscle pain along with a stiff neck, fever, and vomiting.  You have severe muscle weakness or cannot move part of your body. MAKE SURE YOU:   Understand these instructions.  Will watch your condition.  Will get  help right away if you are not doing well or get worse. Document Released: 09/12/2006 Document Revised: 10/26/2013 Document Reviewed: 08/17/2013 [facility] Patient Information 2015 [place], [place]. This information is not intended to replace advice given to you by your health care provider. Make sure you discuss any questions you have with your health care provider.

## 2015-02-25 NOTE — ED Notes (Signed)
Reports attic stair falling on left shoulder.  Pain in left shoulder.  Pt is using ice for comfort.  Incident happened today around 4:40 p.m.

## 2015-02-25 NOTE — ED Provider Notes (Signed)
CSN: [id number]     Arrival date & time 02/25/15  1752 History   First MD Initiated Contact with Patient 02/25/15 1914     Chief Complaint  Patient presents with  . Shoulder Injury   (Consider location/radiation/quality/duration/timing/severity/associated sxs/prior Treatment) HPI Comments: 59 year old female was standing under attic steps when he accidentally fell onto her left shoulder. She states the steps grazed her on the left side of the head. She is complaining primarily of pain to the ridge of the left trapezius muscle. Denies known injury to her head or scalp. Denies problems with vision, speech, hearing, swallowing, confusion, orientation or memory.   Past Medical History  Diagnosis Date  . Depression   . Asthma   . Cataract   . Hypertension   . Anxiety   . Obese   . 250.00     not on med for diabetes  . Hyperlipidemia   . Arthritis    Past Surgical History  Procedure Laterality Date  . Knee surgery Left 1998  . Foot surgery Left 2002  . Appendectomy  1984  . Cataract extraction w/ intraocular lens  implant, bilateral  2014  . Cesarean section  1984, 1987, 1994    X3  . Tubal ligation  1994  . Rotator cuff repair Right 2007   Family History  Problem Relation Age of Onset  . Dementia Father   . Anxiety disorder Sister   . Colon cancer Neg Hx    History  Substance Use Topics  . Smoking status: Former Smoker    Quit date: 11/04/1977  . Smokeless tobacco: Never Used  . Alcohol Use: 3.6 oz/week    6 Glasses of wine per week     Comment: occasional   OB History    No data available     Review of Systems  Constitutional: Negative for fever, activity change and fatigue.  HENT: Negative.   Eyes: Negative.   Respiratory: Negative for cough and shortness of breath.   Cardiovascular: Negative.   Genitourinary: Negative.   Musculoskeletal: Negative for back pain, joint swelling, gait problem, neck pain and neck stiffness.  Neurological: Negative for  dizziness, tremors, syncope, facial asymmetry, speech difficulty and headaches.    Allergies  Biaxin; Serevent; and Tequin  Home Medications   Prior to Admission medications   Medication Sig Start Date End Date Taking? Authorizing Provider  albuterol (PROVENTIL HFA;VENTOLIN HFA) 108 (90 BASE) MCG/ACT inhaler Inhale 2 puffs into the lungs every 6 (six) hours as needed. wheezing 11/19/13   Vicie Mutters, PA-C  Ascorbic Acid (VITAMIN C PO) Take 1 tablet by mouth daily.    Historical Provider, MD  Calcium Carbonate-Vitamin D (CALCIUM + D PO) Take 1 tablet by mouth daily.    Historical Provider, MD  chlorpheniramine-HYDROcodone (TUSSIONEX PENNKINETIC ER) 10-8 MG/5ML LQCR Take 5 mLs by mouth 2 (two) times daily. 12/31/13   Kelby Aline, PA-C  diclofenac (CATAFLAM) 50 MG tablet One half to 1 tablet TID with food prn pain. 02/25/15   Janne Napoleon, NP  diclofenac sodium (VOLTAREN) 1 % GEL Apply 1 application topically 4 (four) times daily. 02/25/15   Janne Napoleon, NP  FLUoxetine (PROZAC) 20 MG tablet Take 20 mg by mouth daily.    Historical Provider, MD  Fluticasone-Salmeterol (ADVAIR DISKUS) 250-50 MCG/DOSE AEPB Inhale 1 puff into the lungs 2 (two) times daily. 11/28/13   Heather Elnora Morrison, PA-C  ipratropium-albuterol (DUONEB) 0.5-2.5 (3) MG/3ML SOLN Take 3 mLs by nebulization every 4 (four) hours as needed. 01/03/14  Kelby Aline, PA-C  methylphenidate (RITALIN) 20 MG tablet Take 20 mg by mouth 2 (two) times daily.    Historical Provider, MD  traMADol (ULTRAM) 50 MG tablet Take 1 tablet (50 mg total) by mouth every 6 (six) hours as needed. 02/25/15   Janne Napoleon, NP   BP 162/96 mmHg  Pulse 75  Temp(Src) 97.8 F (36.6 C) (Oral)  Resp 16  SpO2 100% Physical Exam  Constitutional: She is oriented to person, place, and time. She appears well-developed and well-nourished. No distress.  HENT:  Right Ear: External ear normal.  Left Ear: External ear normal.  Eyes: Conjunctivae and EOM are normal. Pupils are  equal, round, and reactive to light.  Neck: Normal range of motion. Neck supple.  Pulmonary/Chest: Effort normal. No respiratory distress.  Musculoskeletal: She exhibits tenderness. She exhibits no edema.  Marked tenderness to the ridge of the left trapezius muscle as well has the deltoid. She is able to abduct to 90. Internal and external rotation is intact. Distal neurovascular motor Sentry is intact.  Lymphadenopathy:    She has no cervical adenopathy.  Neurological: She is alert and oriented to person, place, and time. No cranial nerve deficit. She exhibits normal muscle tone. Coordination normal.  Skin: Skin is warm and dry.  Psychiatric: She has a normal mood and affect.  Nursing note and vitals reviewed.   ED Course  Procedures (including critical care time) Labs Review Labs Reviewed - No data to display  Imaging Review Dg Shoulder Left  02/25/2015   CLINICAL DATA:  Blunt trauma to shoulder.  Pain  EXAM: LEFT SHOULDER - 2+ VIEW  COMPARISON:  None  FINDINGS: Deformity of the clavicle which appears chronic. Correlate with history and pain in this area. Glenohumeral joint normal. Mild degenerative change in the [facility] joint.  IMPRESSION: Deformity of the mid to distal clavicle which appears to be a healed fracture. No acute fracture identified.   Electronically Signed   By: Franchot Gallo M.D.   On: 02/25/2015 19:47     MDM   1. Shoulder contusion, left, initial encounter   2. Muscle pain    Diclofenac gel 4 times a day when necessary Wear arm sling for 2 or 3 days but remove it periodically to move shoulder about to prevent frozen shoulder as discussed Cataflam 3 times a day with food when necessary One half to one tablet of tramadol 50 mg every 4-6 hours when necessary #15 Use ice for the first couple days and change to heat.      Janne Napoleon, NP 02/25/15 2010

## 2015-03-06 ENCOUNTER — Ambulatory Visit: Payer: Self-pay | Admitting: Internal Medicine

## 2015-03-09 ENCOUNTER — Other Ambulatory Visit: Payer: Self-pay | Admitting: Internal Medicine

## 2015-03-09 DIAGNOSIS — M2391 Unspecified internal derangement of right knee: Secondary | ICD-10-CM

## 2015-03-24 ENCOUNTER — Other Ambulatory Visit: Payer: Self-pay | Admitting: Internal Medicine

## 2015-03-30 ENCOUNTER — Encounter: Payer: Self-pay | Admitting: Internal Medicine

## 2015-03-30 ENCOUNTER — Ambulatory Visit (INDEPENDENT_AMBULATORY_CARE_PROVIDER_SITE_OTHER): Payer: No Typology Code available for payment source | Admitting: Internal Medicine

## 2015-03-30 VITALS — BP 138/84 | HR 96 | Temp 98.0°F | Resp 16 | Ht 63.0 in | Wt 190.0 lb

## 2015-03-30 DIAGNOSIS — E559 Vitamin D deficiency, unspecified: Secondary | ICD-10-CM

## 2015-03-30 DIAGNOSIS — J069 Acute upper respiratory infection, unspecified: Secondary | ICD-10-CM

## 2015-03-30 DIAGNOSIS — E785 Hyperlipidemia, unspecified: Secondary | ICD-10-CM

## 2015-03-30 DIAGNOSIS — E119 Type 2 diabetes mellitus without complications: Secondary | ICD-10-CM

## 2015-03-30 DIAGNOSIS — Z79899 Other long term (current) drug therapy: Secondary | ICD-10-CM

## 2015-03-30 DIAGNOSIS — I10 Essential (primary) hypertension: Secondary | ICD-10-CM

## 2015-03-30 LAB — BASIC METABOLIC PANEL WITH GFR
BUN: 16 mg/dL (ref 6–23)
CO2: 25 mEq/L (ref 19–32)
CREATININE: 0.71 mg/dL (ref 0.50–1.10)
Calcium: 9.2 mg/dL (ref 8.4–10.5)
Chloride: 104 mEq/L (ref 96–112)
GFR, Est African American: >89 mL/min
GFR, Est Non African American: >89 mL/min
Glucose, Bld: 109 mg/dL — ABNORMAL HIGH (ref 70–99)
Potassium: 4 mEq/L (ref 3.5–5.3)
Sodium: 140 mEq/L (ref 135–145)

## 2015-03-30 LAB — HEPATIC FUNCTION PANEL
ALT: 22 U/L (ref 0–35)
AST: 19 U/L (ref 0–37)
Albumin: 4.2 g/dL (ref 3.5–5.2)
Alkaline Phosphatase: 53 U/L (ref 39–117)
BILIRUBIN INDIRECT: 0.7 mg/dL (ref 0.2–1.2)
Bilirubin, Direct: 0.2 mg/dL (ref 0.0–0.3)
Total Bilirubin: 0.9 mg/dL (ref 0.2–1.2)
Total Protein: 6.8 g/dL (ref 6.0–8.3)

## 2015-03-30 LAB — CBC WITH DIFFERENTIAL/PLATELET
BASOS ABS: 0.1 10*3/uL (ref 0.0–0.1)
Basophils Relative: 1 % (ref 0–1)
Eosinophils Absolute: 0.6 10*3/uL (ref 0.0–0.7)
Eosinophils Relative: 10 % — ABNORMAL HIGH (ref 0–5)
HCT: 38.9 % (ref 36.0–46.0)
HEMOGLOBIN: 13 g/dL (ref 12.0–15.0)
LYMPHS PCT: 42 % (ref 12–46)
Lymphs Abs: 2.4 10*3/uL (ref 0.7–4.0)
MCH: 30.4 pg (ref 26.0–34.0)
MCHC: 33.4 g/dL (ref 30.0–36.0)
MCV: 90.9 fL (ref 78.0–100.0)
MONO ABS: 0.5 10*3/uL (ref 0.1–1.0)
MPV: 10.3 fL (ref 8.6–12.4)
Monocytes Relative: 9 % (ref 3–12)
Neutro Abs: 2.2 10*3/uL (ref 1.7–7.7)
Neutrophils Relative %: 38 % — ABNORMAL LOW (ref 43–77)
Platelets: 295 10*3/uL (ref 150–400)
RBC: 4.28 MIL/uL (ref 3.87–5.11)
RDW: 13.2 % (ref 11.5–15.5)
WBC: 5.8 10*3/uL (ref 4.0–10.5)

## 2015-03-30 LAB — HEMOGLOBIN A1C
HEMOGLOBIN A1C: 5.9 % — AB (ref <5.7)
Mean Plasma Glucose: 123 mg/dL — ABNORMAL HIGH (ref <117)

## 2015-03-30 LAB — MAGNESIUM: Magnesium: 1.8 mg/dL (ref 1.5–2.5)

## 2015-03-30 LAB — LIPID PANEL
CHOLESTEROL: 226 mg/dL — AB (ref 0–200)
HDL: 55 mg/dL (ref >=46)
LDL Cholesterol: 150 mg/dL — ABNORMAL HIGH (ref 0–99)
Total CHOL/HDL Ratio: 4.1 Ratio
Triglycerides: 104 mg/dL (ref <150)
VLDL: 21 mg/dL (ref 0–40)

## 2015-03-30 LAB — TSH: TSH: 0.749 u[IU]/mL (ref 0.350–4.500)

## 2015-03-30 MED ORDER — AZITHROMYCIN 250 MG PO TABS: ORAL_TABLET | ORAL | Status: DC

## 2015-03-30 MED ORDER — BENZONATATE 100 MG PO CAPS: 100.0000 mg | ORAL_CAPSULE | Freq: Four times a day (QID) | ORAL | Status: DC | PRN

## 2015-03-30 MED ORDER — PREDNISONE 20 MG PO TABS: ORAL_TABLET | ORAL | Status: DC

## 2015-03-30 NOTE — Patient Instructions (Signed)

## 2015-03-30 NOTE — Progress Notes (Signed)
Patient ID: DARENDA FIKE, female   DOB: [birth year], 59 y.o.   MRN: [id number]  Assessment and Plan:   1. Essential hypertension -monitor at home -currently doing well off medication -cont diet and exercise - TSH  2. Type 2 diabetes mellitus without complication  - Hemoglobin A1c - Insulin, random  3. Hyperlipidemia -diet and exercise - Lipid panel  4. Vitamin D deficiency -cont supplement - Vitamin D 1,25 dihydroxy  5. Medication management  - CBC with Differential/Platelet - BASIC METABOLIC PANEL WITH GFR - Hepatic function panel - Magnesium  6. Acute URI  - azithromycin (ZITHROMAX Z-PAK) 250 MG tablet; 2 po day one, then 1 daily x 4 days  Dispense: 5 tablet; Refill: 0 - benzonatate (TESSALON PERLES) 100 MG capsule; Take 1 capsule (100 mg total) by mouth every 6 (six) hours as needed for cough.  Dispense: 30 capsule; Refill: 1 - predniSONE (DELTASONE) 20 MG tablet; 3 tabs po day one, then 2 tabs daily x 4 days  Dispense: 11 tablet; Refill: 0     Continue diet and meds as discussed. Further disposition pending results of labs. Discussed med's effects and SE's.    HPI 59 y.o. female  presents for 3 month follow up with hypertension, hyperlipidemia, diabetes and vitamin D deficiency.   Her blood pressure has been controlled at home, today their BP is BP: 138/84 mmHg.She does workout.  She has been walking a lot.   She denies chest pain, shortness of breath, dizziness.   She is not on cholesterol medication and denies myalgias. Her cholesterol is not at goal. The cholesterol was:  No results found for requested labs within last 365 days.   She has been working on diet and exercise for diabetes without complications, she is on bASA, she is not on ACE/ARB, and denies  foot ulcerations, hyperglycemia, hypoglycemia , increased appetite, nausea, paresthesia of the feet, polydipsia, polyuria, visual disturbances, vomiting and weight loss. Last A1C was: No results found for  requested labs within last 365 days.   Patient is on Vitamin D supplement. No results found for requested labs within last 365 days.  Patient returning to the office for evaluation after she lost her insurance.    She also reports that she has been coughing a lot as well.  She has not been doing her nebulizers.  She has been bringing up brown sputum with her cough which has been going on a week.  She does sometimes have seasonal allergies.    She is having some knee problems. She is seeing Dr. Maureen Ralphs.    She is also seeing Dr. Robina Ade and she has started both her concerta and also her prozac which is helping.    Current Medications:  Current Outpatient Prescriptions on File Prior to Visit  Medication Sig Dispense Refill  . albuterol (PROVENTIL HFA;VENTOLIN HFA) 108 (90 BASE) MCG/ACT inhaler Inhale 2 puffs into the lungs every 6 (six) hours as needed. wheezing 8.5 g 2  . Ascorbic Acid (VITAMIN C PO) Take 1 tablet by mouth daily.    . Calcium Carbonate-Vitamin D (CALCIUM + D PO) Take 1 tablet by mouth daily.    Marland Kitchen FLUoxetine (PROZAC) 20 MG tablet Take 20 mg by mouth daily.    Marland Kitchen ipratropium-albuterol (DUONEB) 0.5-2.5 (3) MG/3ML SOLN Take 3 mLs by nebulization every 4 (four) hours as needed. 360 mL 3  . traMADol (ULTRAM) 50 MG tablet Take 1 tablet (50 mg total) by mouth every 6 (six) hours as needed. 15  tablet 0   Current Facility-Administered Medications on File Prior to Visit  Medication Dose Route Frequency Provider Last Rate Last Dose  . albuterol (PROVENTIL) (2.5 MG/3ML) 0.083% nebulizer solution 2.5 mg  2.5 mg Nebulization Once Heather M Marte, PA-C      . ipratropium (ATROVENT) nebulizer solution 0.5 mg  0.5 mg Nebulization Once Collene Leyden, PA-C       Medical History:  Past Medical History  Diagnosis Date  . Depression   . Asthma   . Cataract   . Hypertension   . Anxiety   . Obese   . 250.00     not on med for diabetes  . Hyperlipidemia   . Arthritis    Allergies:   Allergies  Allergen Reactions  . Biaxin [Clarithromycin]     GI upset  . Serevent [Salmeterol]     Palpitations  . Tequin [Gatifloxacin]     GI upset. thrush     Review of Systems:  Review of Systems  Constitutional: Positive for malaise/fatigue. Negative for fever and chills.  HENT: Positive for sore throat. Negative for congestion, ear discharge and ear pain.   Eyes: Negative.   Respiratory: Positive for cough and sputum production. Negative for shortness of breath and wheezing.   Cardiovascular: Negative for chest pain, palpitations and leg swelling.  Gastrointestinal: Negative for heartburn, nausea, vomiting, diarrhea, constipation, blood in stool and melena.  Genitourinary: Negative for dysuria, urgency and frequency.  Neurological: Negative for dizziness, sensory change, loss of consciousness and headaches.  Psychiatric/Behavioral: Negative for depression. The patient has insomnia. The patient is not nervous/anxious.     Family history- Review and unchanged  Social history- Review and unchanged  Physical Exam: BP 138/84 mmHg  Pulse 96  Temp(Src) 98 F (36.7 C) (Temporal)  Resp 16  Ht 5\' 3"  (1.6 m)  Wt 190 lb (86.183 kg)  BMI 33.67 kg/m2  LMP 11/26/2011 Wt Readings from Last 3 Encounters:  03/30/15 190 lb (86.183 kg)  01/03/14 206 lb (93.441 kg)  12/31/13 206 lb (93.441 kg)   General Appearance: Well nourished well developed, non-toxic appearing, in no apparent distress. Eyes: PERRLA, EOMs, conjunctiva no swelling or erythema ENT/Mouth: Ear canals clear with no erythema, swelling, or discharge.  TMs normal bilaterally, oropharynx clear, moist, with no exudate.   Neck: Supple, thyroid normal, no JVD, no cervical adenopathy.  Respiratory: Respiratory effort normal, breath sounds clear A&P, no wheeze,or rales noted.  Occasional rhonchi which clears with coughing.  No retractions, no accessory muscle usage Cardio: RRR with no MRGs. No noted edema.  Abdomen: Soft, +  BS.  Non tender, no guarding, rebound, hernias, masses. Musculoskeletal: Full ROM, 5/5 strength, Normal gait Skin: Warm, dry without rashes, lesions, ecchymosis.  Neuro: Awake and oriented X 3, Cranial nerves intact. No cerebellar symptoms.  Psych: normal affect, Insight and Judgment appropriate.    FORCUCCI, Meliana Canner, PA-C 9:04 AM [facility]

## 2015-03-31 LAB — INSULIN, RANDOM: Insulin: 7 u[IU]/mL (ref 2.0–19.6)

## 2015-04-02 LAB — VITAMIN D 1,25 DIHYDROXY
Vitamin D 1, 25 (OH)2 Total: 56 pg/mL (ref 18–72)
Vitamin D3 1, 25 (OH)2: 56 pg/mL

## 2015-04-03 ENCOUNTER — Other Ambulatory Visit: Payer: Self-pay | Admitting: Internal Medicine

## 2015-04-03 MED ORDER — PRAVASTATIN SODIUM 40 MG PO TABS: 40.0000 mg | ORAL_TABLET | Freq: Every evening | ORAL | Status: DC

## 2015-04-25 ENCOUNTER — Encounter: Payer: Self-pay | Admitting: Internal Medicine

## 2015-04-25 ENCOUNTER — Ambulatory Visit (INDEPENDENT_AMBULATORY_CARE_PROVIDER_SITE_OTHER): Payer: No Typology Code available for payment source | Admitting: Internal Medicine

## 2015-04-25 VITALS — BP 138/80 | HR 90 | Temp 98.0°F | Resp 16 | Ht 63.0 in

## 2015-04-25 DIAGNOSIS — J45909 Unspecified asthma, uncomplicated: Secondary | ICD-10-CM

## 2015-04-25 DIAGNOSIS — J020 Streptococcal pharyngitis: Secondary | ICD-10-CM

## 2015-04-25 MED ORDER — PHENYLEPH-PROMETHAZINE-COD 5-6.25-10 MG/5ML PO SYRP: 5.0000 mL | ORAL_SOLUTION | Freq: Every evening | ORAL | Status: DC | PRN

## 2015-04-25 MED ORDER — LIDOCAINE VISCOUS 2 % MT SOLN: 20.0000 mL | Freq: Four times a day (QID) | OROMUCOSAL | Status: DC | PRN

## 2015-04-25 MED ORDER — AMOXICILLIN-POT CLAVULANATE 875-125 MG PO TABS: 1.0000 | ORAL_TABLET | Freq: Two times a day (BID) | ORAL | Status: DC

## 2015-04-25 NOTE — Progress Notes (Signed)
Patient ID: Haley White, female   DOB: 05-17-1956, 59 y.o.   MRN: [id number]  HPI  Patient presents to the office for evaluation of cough.  It has been going on for 2 days.  Patient reports all the time, wet, green and bloody mucous production.  They also endorse change in voice, chills, fever, postnasal drip, sputum production, wheezing and sore throat, headache, ear pain bilaterally..  They have tried ibuprofen.  They report that nothing has worked.  They admits to other sick contacts. 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   Review of Systems  Constitutional: Positive for fever and malaise/fatigue. Negative for chills.  HENT: Positive for congestion and sore throat. Negative for ear pain.   Respiratory: Positive for cough, shortness of breath and wheezing.   Cardiovascular: Negative for chest pain, palpitations and leg swelling.  Neurological: Positive for headaches.    PE:  General:  Alert and non-toxic, WDWN, NAD HEENT: NCAT, PERLA, EOM normal, no occular discharge or erythema.  Nasal mucosal edema with sinus tenderness to palpation.  Oropharynx clear with minimal oropharyngeal edema and erythema.  Mucous membranes moist and pink. Neck:  Cervical adenopathy Chest:  RRR no MRGs.  Lungs clear to auscultation A&P with no wheezes rhonchi or rales.   Abdomen: +BS x 4 quadrants, soft, non-tender, no guarding, rigidity, or rebound. Skin: warm and dry no rash Neuro: A&Ox4, CN II-XII grossly intact  Assessment and Plan:   1. Streptococcal sore throat  - amoxicillin-clavulanate (AUGMENTIN) 875-125 MG per tablet; Take 1 tablet by mouth 2 (two) times daily. One po bid x 7 days  Dispense: 14 tablet; Refill: 0 - lidocaine (XYLOCAINE) 2 % solution; Use as directed 20 mLs in the mouth or throat every 6 (six) hours as needed for mouth pain.  Dispense: 100 mL; Refill: 0 - Phenyleph-Promethazine-Cod 5-6.25-10 MG/5ML SYRP; Take 5 mLs by mouth  at bedtime as needed (severe cough).  Dispense: 180 mL; Refill: 0  Return if no improvements

## 2015-04-27 ENCOUNTER — Ambulatory Visit (INDEPENDENT_AMBULATORY_CARE_PROVIDER_SITE_OTHER): Payer: No Typology Code available for payment source

## 2015-04-27 ENCOUNTER — Ambulatory Visit (INDEPENDENT_AMBULATORY_CARE_PROVIDER_SITE_OTHER): Payer: No Typology Code available for payment source | Admitting: Family Medicine

## 2015-04-27 VITALS — BP 130/86 | HR 90 | Temp 98.4°F | Resp 17

## 2015-04-27 DIAGNOSIS — M25572 Pain in left ankle and joints of left foot: Secondary | ICD-10-CM | POA: Diagnosis not present

## 2015-04-27 DIAGNOSIS — M79672 Pain in left foot: Secondary | ICD-10-CM | POA: Diagnosis not present

## 2015-04-27 DIAGNOSIS — S96912A Strain of unspecified muscle and tendon at ankle and foot level, left foot, initial encounter: Secondary | ICD-10-CM

## 2015-04-27 IMAGING — CR DG FOOT COMPLETE 3+V*L*
3 series · 3 of 3 positions shown · non-contrast
Comparison: None.

CLINICAL DATA: Foot pain, no known trauma

EXAM:
LEFT FOOT - COMPLETE 3+ VIEW

[AP]
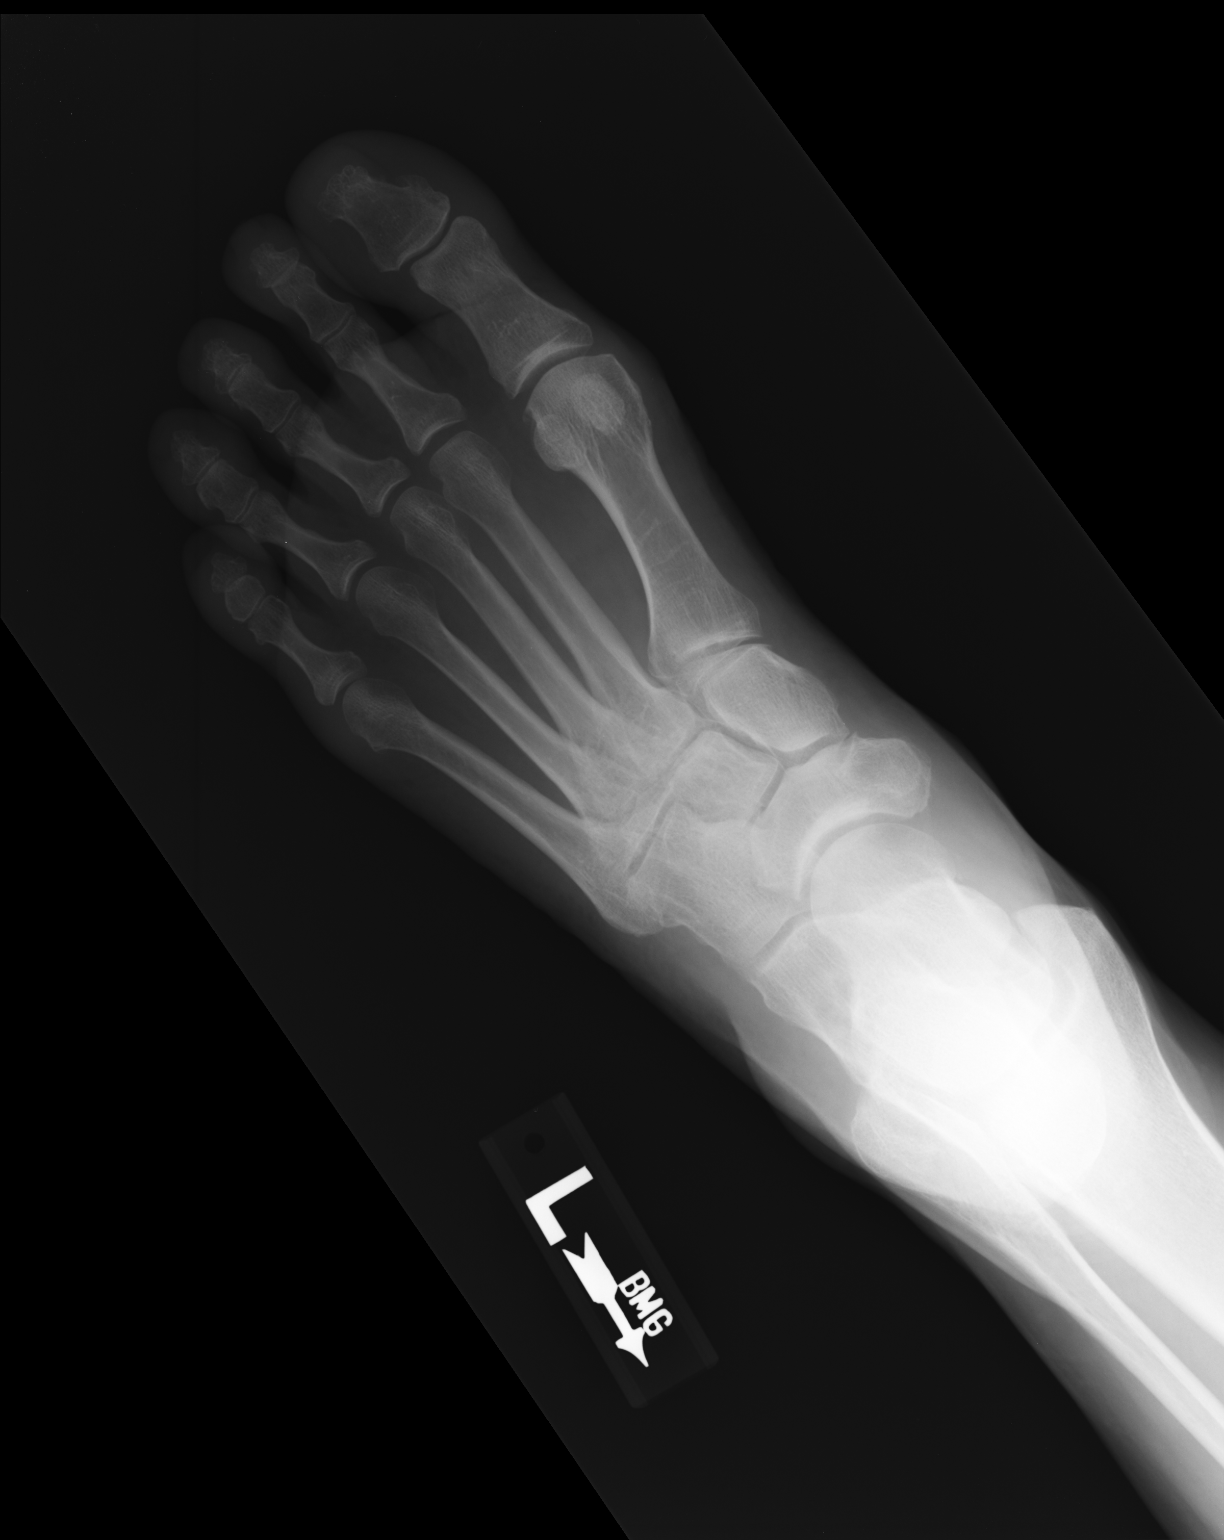

[ap obl int rot]
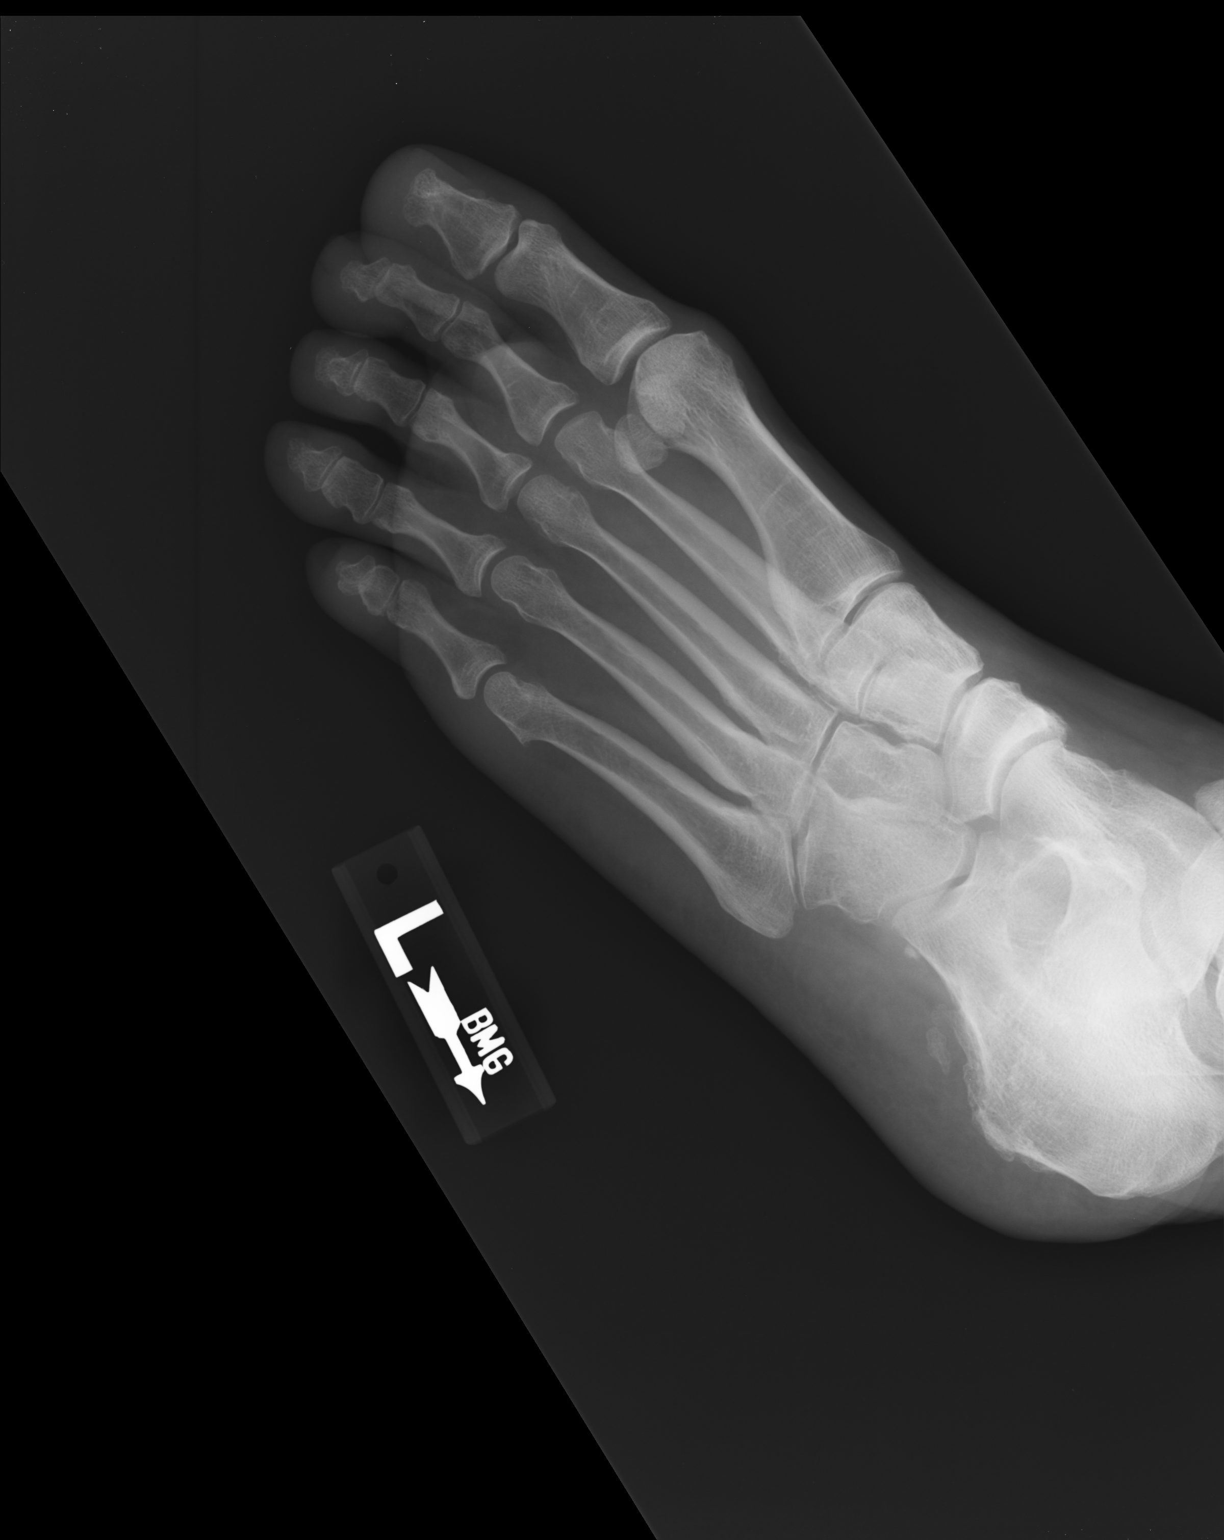

[lateral]
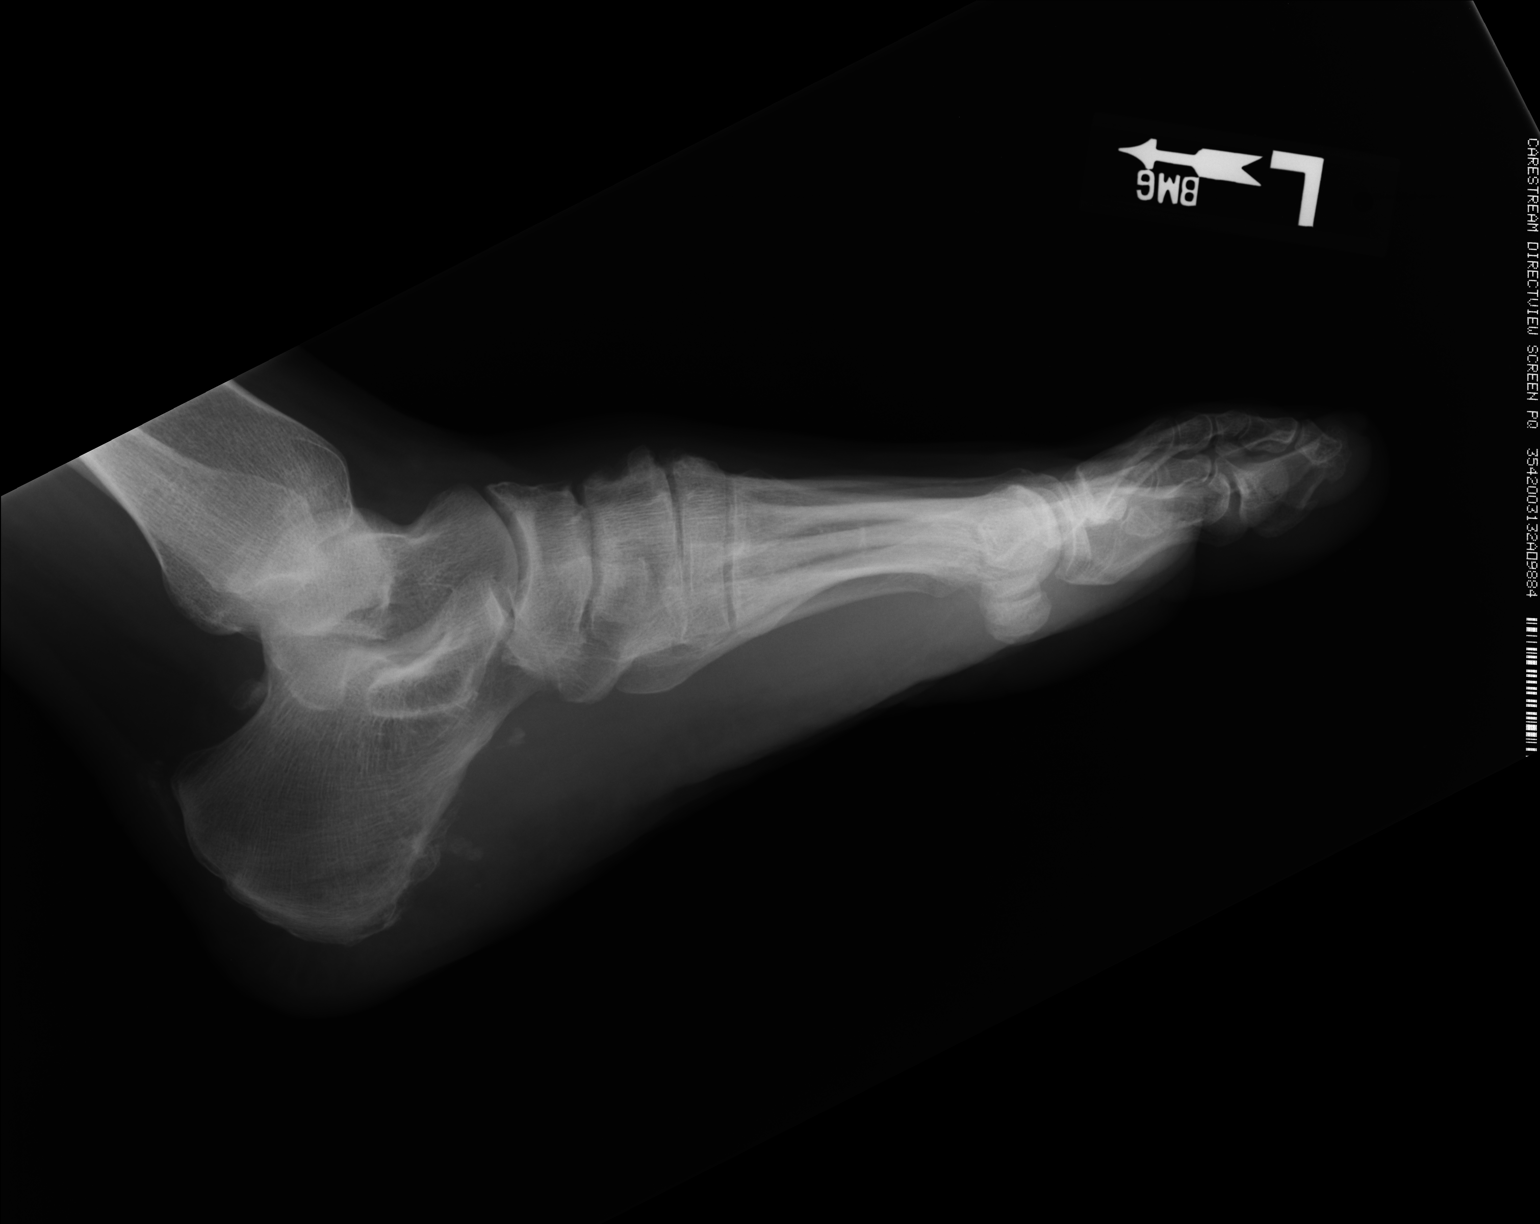

[3 of 3 positions shown; findings below may reference images not displayed]

FINDINGS: Tarsal-metatarsal alignment is normal. No fracture is seen. No
erosion is noted. There is degenerative change in the midfoot. Also
there is a small plantar calcaneal degenerative spur present.
IMPRESSION: No acute abnormality. Degenerative change primary in the midfoot
with a plantar calcaneal degenerative spur as well.

## 2015-04-27 MED ORDER — DICLOFENAC SODIUM 50 MG PO TBEC: 50.0000 mg | DELAYED_RELEASE_TABLET | Freq: Three times a day (TID) | ORAL | Status: DC

## 2015-04-27 NOTE — Progress Notes (Signed)
  Subjective:  Patient ID: Haley White, female    DOB: 09-Mar-1956  Age: 59 y.o. MRN: [id number]  59 year old lady who's here with a history of having bad pain in her left foot. She has been moving houses, up and down stairs and lifting and packing. She tripped 2 days ago, and says she could've hurt her foot at that time but wasn't particularly aware of it. Yesterday she started hurting more in the foot and then his gotten much more intense. She hurts across the top of her midfoot, some down underneath. She  She works in childcare, up and down all the time. Has not been having problems with her feet though she has what she calls a golf ball on the top of her foot all the time. She had a cortisone injection in her right knee several weeks ago which helped considerably. This is a new problem.  Pain extends from her foot on up into the lower leg. She is on medicine for hypertension and antianxiety medication   Objective:   Obviously having pain in her left foot, has it elevated. When stressed move the foot it hurts acutely. No obvious discoloration. Tenderness in the calf or ankle. Toes are nontender, when she wiggles her foot at all it hurts her. She is very tender across the midfoot and tender on the mid arch area on palpation. Moderate obesity.   Assessment & Plan:   Assessment: Left foot pain, midfoot Obesity  Plan:  X-ray foot [facility] reading (PRIMARY) by  Dr. Linna Darner No fracture  Cam walker Ice Continue diclofenac that she has She has tramadol for pain Crutches.  Patient Instructions  Take the diclofenac 50 mg 3 times daily  Continue using the tramadol if needed for pain  Elevate  Ice for 15-20 minutes 4-6 times daily  Wear cam walker and use crutches  If not improving by early next week we can refer you to Dr. Ricki Rodriguez. Since you have seen him before, you may be able to just call his office to get your own appointment, but if necessary call here and we will make the  referral.  Stay off work through the weekend at least.     [facility], MD 04/27/2015

## 2015-04-27 NOTE — Patient Instructions (Addendum)
Take the diclofenac 50 mg 3 times daily  Continue using the tramadol if needed for pain  Elevate  Ice for 15-20 minutes 4-6 times daily  Wear cam walker and use crutches  If not improving by early next week we can refer you to Dr. Ricki Rodriguez. Since you have seen him before, you may be able to just call his office to get your own appointment, but if necessary call here and we will make the referral.  Stay off work through the weekend at least.

## 2015-05-01 ENCOUNTER — Encounter (HOSPITAL_COMMUNITY): Payer: Self-pay | Admitting: Emergency Medicine

## 2015-05-01 ENCOUNTER — Encounter: Payer: Self-pay | Admitting: Family Medicine

## 2015-05-01 ENCOUNTER — Emergency Department (HOSPITAL_COMMUNITY)
Admission: EM | Admit: 2015-05-01 | Discharge: 2015-05-01 | Disposition: A | Discharge status: Home / Self Care | Payer: No Typology Code available for payment source | Attending: Emergency Medicine | Admitting: Emergency Medicine

## 2015-05-01 DIAGNOSIS — M109 Gout, unspecified: Secondary | ICD-10-CM | POA: Diagnosis not present

## 2015-05-01 DIAGNOSIS — M1990 Unspecified osteoarthritis, unspecified site: Secondary | ICD-10-CM | POA: Diagnosis not present

## 2015-05-01 DIAGNOSIS — E785 Hyperlipidemia, unspecified: Secondary | ICD-10-CM | POA: Insufficient documentation

## 2015-05-01 DIAGNOSIS — Z9842 Cataract extraction status, left eye: Secondary | ICD-10-CM | POA: Diagnosis not present

## 2015-05-01 DIAGNOSIS — J45909 Unspecified asthma, uncomplicated: Secondary | ICD-10-CM | POA: Insufficient documentation

## 2015-05-01 DIAGNOSIS — Z9889 Other specified postprocedural states: Secondary | ICD-10-CM | POA: Diagnosis not present

## 2015-05-01 DIAGNOSIS — Z9841 Cataract extraction status, right eye: Secondary | ICD-10-CM | POA: Insufficient documentation

## 2015-05-01 DIAGNOSIS — E669 Obesity, unspecified: Secondary | ICD-10-CM | POA: Insufficient documentation

## 2015-05-01 DIAGNOSIS — E119 Type 2 diabetes mellitus without complications: Secondary | ICD-10-CM | POA: Insufficient documentation

## 2015-05-01 DIAGNOSIS — M79672 Pain in left foot: Secondary | ICD-10-CM | POA: Diagnosis present

## 2015-05-01 DIAGNOSIS — F419 Anxiety disorder, unspecified: Secondary | ICD-10-CM | POA: Diagnosis not present

## 2015-05-01 DIAGNOSIS — F329 Major depressive disorder, single episode, unspecified: Secondary | ICD-10-CM | POA: Insufficient documentation

## 2015-05-01 DIAGNOSIS — M25572 Pain in left ankle and joints of left foot: Secondary | ICD-10-CM | POA: Diagnosis not present

## 2015-05-01 DIAGNOSIS — Z79899 Other long term (current) drug therapy: Secondary | ICD-10-CM | POA: Diagnosis not present

## 2015-05-01 DIAGNOSIS — Z87891 Personal history of nicotine dependence: Secondary | ICD-10-CM | POA: Diagnosis not present

## 2015-05-01 DIAGNOSIS — I10 Essential (primary) hypertension: Secondary | ICD-10-CM | POA: Diagnosis not present

## 2015-05-01 DIAGNOSIS — Z791 Long term (current) use of non-steroidal anti-inflammatories (NSAID): Secondary | ICD-10-CM | POA: Diagnosis not present

## 2015-05-01 MED ORDER — ONDANSETRON 4 MG PO TBDP
4.0000 mg | ORAL_TABLET | Freq: Once | ORAL | Status: AC
  Administered 2015-05-01: 4 mg via ORAL
  Filled 2015-05-01: qty 1

## 2015-05-01 MED ORDER — OXYCODONE-ACETAMINOPHEN 5-325 MG PO TABS: 1.0000 | ORAL_TABLET | ORAL | Status: DC | PRN

## 2015-05-01 MED ORDER — OXYCODONE-ACETAMINOPHEN 5-325 MG PO TABS
1.0000 | ORAL_TABLET | Freq: Once | ORAL | Status: AC
  Administered 2015-05-01: 1 via ORAL
  Filled 2015-05-01: qty 1

## 2015-05-01 NOTE — Discharge Instructions (Signed)
Read the information below.  Use the prescribed medication as directed.  Please discuss all new medications with your pharmacist.  Do not take additional tylenol while taking the prescribed pain medication to avoid overdose.  You may return to the Emergency Department at any time for worsening condition or any new symptoms that concern you.   If you develop uncontrolled pain, weakness or numbness of the extremity, severe discoloration of the skin, or you are unable to walk or move your ankle, return to the ER for a recheck.       Ankle Pain Ankle pain is a common symptom. The bones, cartilage, tendons, and muscles of the ankle joint perform a lot of work each day. The ankle joint holds your body weight and allows you to move around. Ankle pain can occur on either side or back of 1 or both ankles. Ankle pain may be sharp and burning or dull and aching. There may be tenderness, stiffness, redness, or warmth around the ankle. The pain occurs more often when a person walks or puts pressure on the ankle. CAUSES  There are many reasons ankle pain can develop. It is important to work with your caregiver to identify the cause since many conditions can impact the bones, cartilage, muscles, and tendons. Causes for ankle pain include:  Injury, including a break (fracture), sprain, or strain often due to a fall, sports, or a high-impact activity.  Swelling (inflammation) of a tendon (tendonitis).  Achilles tendon rupture.  Ankle instability after repeated sprains and strains.  Poor foot alignment.  Pressure on a nerve (tarsal tunnel syndrome).  Arthritis in the ankle or the lining of the ankle.  Crystal formation in the ankle (gout or pseudogout). DIAGNOSIS  A diagnosis is based on your medical history, your symptoms, results of your physical exam, and results of diagnostic tests. Diagnostic tests may include X-ray exams or a computerized magnetic scan (magnetic resonance imaging, MRI). TREATMENT    Treatment will depend on the cause of your ankle pain and may include:  Keeping pressure off the ankle and limiting activities.  Using crutches or other walking support (a cane or brace).  Using rest, ice, compression, and elevation.  Participating in physical therapy or home exercises.  Wearing shoe inserts or special shoes.  Losing weight.  Taking medications to reduce pain or swelling or receiving an injection.  Undergoing surgery. HOME CARE INSTRUCTIONS   Only take over-the-counter or prescription medicines for pain, discomfort, or fever as directed by your caregiver.  Put ice on the injured area.  Put ice in a plastic bag.  Place a towel between your skin and the bag.  Leave the ice on for 15-20 minutes at a time, 03-04 times a day.  Keep your leg raised (elevated) when possible to lessen swelling.  Avoid activities that cause ankle pain.  Follow specific exercises as directed by your caregiver.  Record how often you have ankle pain, the location of the pain, and what it feels like. This information may be helpful to you and your caregiver.  Ask your caregiver about returning to work or sports and whether you should drive.  Follow up with your caregiver for further examination, therapy, or testing as directed. SEEK MEDICAL CARE IF:   Pain or swelling continues or worsens beyond 1 week.  You have an oral temperature above 102 F (38.9 C).  You are feeling unwell or have chills.  You are having an increasingly difficult time with walking.  You have loss  of sensation or other new symptoms.  You have questions or concerns. MAKE SURE YOU:   Understand these instructions.  Will watch your condition.  Will get help right away if you are not doing well or get worse. Document Released: 04/10/2010 Document Revised: 01/13/2012 Document Reviewed: 04/10/2010 [facility] Patient Information 2015 [place], [place]. This information is not intended to replace advice  given to you by your health care provider. Make sure you discuss any questions you have with your health care provider.

## 2015-05-01 NOTE — ED Notes (Signed)
Pt arrives with L foot pain ongoing since last Wednesday, states swelling since that point. Seen at urgent medical, negative XRAY at that time.

## 2015-05-01 NOTE — ED Provider Notes (Signed)
CSN: [id number]     Arrival date & time 05/01/15  4782 History   First MD Initiated Contact with Patient 05/01/15 0654     Chief Complaint  Patient presents with  . Foot Pain     (Consider location/radiation/quality/duration/timing/severity/associated sxs/prior Treatment) The history is provided by the patient and medical records.   Pt with hx obesity, arthritis p/w left ankle/foot pain that began 6 days ago.  She was seen in urgent care with negative xray but significant pain, d/c home with cam walker, crutches, orthopedic follow up, voltaren tabs, tramadol.  Pt p/w persistent pain, constant, 10/10, worse with attempting to ambulate.  Tmax 99.1 at home.  Denies any injury to her foot or ankle but does note she has been moving houses recently.  Has been treated recently for strep throat with augmentin, pt states this is improving.  Denies recent immobilization, exogenous estrogen, leg swelling, personal or family history of blood clots.    No hx gout.  No other joints currently bothering her, she did have right knee pain in the past few weeks, resolved with injection by Dr Binnie Rail.  She has not yet been seen by orthopedics for her left ankle.    Past Medical History  Diagnosis Date  . Depression   . Asthma   . Cataract   . Hypertension   . Anxiety   . Obese   . 250.00     not on med for diabetes  . Hyperlipidemia   . Arthritis    Past Surgical History  Procedure Laterality Date  . Knee surgery Left 1998  . Foot surgery Left 2002  . Appendectomy  1984  . Cataract extraction w/ intraocular lens  implant, bilateral  2014  . Cesarean section  1984, 1987, 1994    X3  . Tubal ligation  1994  . Rotator cuff repair Right 2007   Family History  Problem Relation Age of Onset  . Dementia Father   . Anxiety disorder Sister   . Colon cancer Neg Hx    History  Substance Use Topics  . Smoking status: Former Smoker    Quit date: 11/04/1977  . Smokeless tobacco: Never Used  . Alcohol  Use: 3.6 oz/week    6 Glasses of wine per week     Comment: occasional   OB History    No data available     Review of Systems  Constitutional: Negative for fever and chills.  Respiratory: Negative for shortness of breath.   Cardiovascular: Negative for chest pain and leg swelling.  Musculoskeletal: Negative for myalgias.  Skin: Negative for pallor, rash and wound.  Allergic/Immunologic: Negative for immunocompromised state.  Neurological: Negative for weakness and numbness.  Hematological: Does not bruise/bleed easily.  Psychiatric/Behavioral: Negative for self-injury.      Allergies  Biaxin; Serevent; and Tequin  Home Medications   Prior to Admission medications   Medication Sig Start Date End Date Taking? Authorizing Provider  albuterol (PROVENTIL HFA;VENTOLIN HFA) 108 (90 BASE) MCG/ACT inhaler Inhale 2 puffs into the lungs every 6 (six) hours as needed. wheezing 11/19/13   Vicie Mutters, PA-C  amoxicillin-clavulanate (AUGMENTIN) 875-125 MG per tablet Take 1 tablet by mouth 2 (two) times daily. One po bid x 7 days 04/25/15   Starlyn Skeans, PA-C  Ascorbic Acid (VITAMIN C PO) Take 1 tablet by mouth daily.    Historical Provider, MD  Calcium Carbonate-Vitamin D (CALCIUM + D PO) Take 1 tablet by mouth daily.    Historical Provider, MD  diclofenac (VOLTAREN) 50 MG EC tablet Take 1 tablet (50 mg total) by mouth 3 (three) times daily. 04/27/15   Posey Boyer, MD  FLUoxetine (PROZAC) 20 MG tablet Take 20 mg by mouth daily.    Historical Provider, MD  ipratropium-albuterol (DUONEB) 0.5-2.5 (3) MG/3ML SOLN Take 3 mLs by nebulization every 4 (four) hours as needed. 01/03/14   Melissa Smith, PA-C  lidocaine (XYLOCAINE) 2 % solution Use as directed 20 mLs in the mouth or throat every 6 (six) hours as needed for mouth pain. 04/25/15   Courtney Forcucci, PA-C  methylphenidate 54 MG PO CR tablet Take 54 mg by mouth every morning.    Historical Provider, MD  Phenyleph-Promethazine-Cod  5-6.25-10 MG/5ML SYRP Take 5 mLs by mouth at bedtime as needed (severe cough). 04/25/15   Courtney Forcucci, PA-C  pravastatin (PRAVACHOL) 40 MG tablet Take 1 tablet (40 mg total) by mouth every evening. 04/03/15 04/02/16  Courtney Forcucci, PA-C   BP 144/70 mmHg  Pulse 80  Temp(Src) 98.2 F (36.8 C) (Oral)  Resp 95  Ht 5' 3.5" (1.613 m)  Wt 190 lb (86.183 kg)  BMI 33.12 kg/m2  SpO2 98%  LMP 11/26/2011 Physical Exam  Constitutional: She appears well-developed and well-nourished. No distress.  HENT:  Head: Normocephalic and atraumatic.  Neck: Neck supple.  Cardiovascular: Intact distal pulses.   Pulmonary/Chest: Effort normal.  Musculoskeletal: She exhibits no edema.       Left lower leg: Normal. She exhibits no tenderness, no swelling and no edema.       Left foot: Normal.  Left medial ankle with localized erythema, edema, warmth, tenderness to light tough.  Decreased ROM secondary to pain.  Pt moves toes, capillary refill < 2 seconds, sensation intact.    Neurological: She is alert.  Skin: She is not diaphoretic.  Nursing note and vitals reviewed.   ED Course  Procedures (including critical care time) Labs Review Labs Reviewed - No data to display  Imaging Review No results found.   EKG Interpretation None      MDM   Final diagnoses:  Left ankle pain  Acute gout of left ankle, unspecified cause    Afebrile, nontoxic patient with left ankle pain with small area of erythema, edema, warmth, and tenderness to even the lightest palpation medially.  Decreased ROM. Suspect gout.  Pt does not have systemic symptoms, does not appear ill, no hx fevers, no injury.  She has had recent strep infection, but has been treated with antibiotics.   Doubt septic joint.  Doubt fracture.  Doubt DVT, cellulitis. Offered immobilization but pt is sensitive to light touch and it will likely cause more pain, pt has crutches.   D/C home with percocet, orthopedic follow up (Dr Binnie Rail).  Discussed  result, findings, treatment, and follow up  with patient.  Pt given return precautions.  Pt verbalizes understanding and agrees with plan.        [place], PA-C 05/01/15 4709  Leonard Schwartz, MD 05/02/15 [phone]

## 2015-05-01 NOTE — ED Notes (Signed)
Pt is A&Ox4 with no change in mental or physical status after pain medication.

## 2015-05-05 ENCOUNTER — Emergency Department (HOSPITAL_COMMUNITY)
Admission: EM | Admit: 2015-05-05 | Discharge: 2015-05-06 | Disposition: A | Discharge status: Other Acute Inpatient Hospital | Payer: No Typology Code available for payment source | Attending: Emergency Medicine | Admitting: Emergency Medicine

## 2015-05-05 DIAGNOSIS — H269 Unspecified cataract: Secondary | ICD-10-CM | POA: Diagnosis not present

## 2015-05-05 DIAGNOSIS — M109 Gout, unspecified: Secondary | ICD-10-CM | POA: Diagnosis not present

## 2015-05-05 DIAGNOSIS — T43632A Poisoning by methylphenidate, intentional self-harm, initial encounter: Secondary | ICD-10-CM | POA: Insufficient documentation

## 2015-05-05 DIAGNOSIS — Z79899 Other long term (current) drug therapy: Secondary | ICD-10-CM | POA: Insufficient documentation

## 2015-05-05 DIAGNOSIS — T43222A Poisoning by selective serotonin reuptake inhibitors, intentional self-harm, initial encounter: Secondary | ICD-10-CM | POA: Diagnosis not present

## 2015-05-05 DIAGNOSIS — I10 Essential (primary) hypertension: Secondary | ICD-10-CM | POA: Diagnosis not present

## 2015-05-05 DIAGNOSIS — E785 Hyperlipidemia, unspecified: Secondary | ICD-10-CM | POA: Insufficient documentation

## 2015-05-05 DIAGNOSIS — E669 Obesity, unspecified: Secondary | ICD-10-CM | POA: Diagnosis not present

## 2015-05-05 DIAGNOSIS — Z791 Long term (current) use of non-steroidal anti-inflammatories (NSAID): Secondary | ICD-10-CM | POA: Insufficient documentation

## 2015-05-05 DIAGNOSIS — F1310 Sedative, hypnotic or anxiolytic abuse, uncomplicated: Secondary | ICD-10-CM | POA: Diagnosis not present

## 2015-05-05 DIAGNOSIS — M1990 Unspecified osteoarthritis, unspecified site: Secondary | ICD-10-CM | POA: Insufficient documentation

## 2015-05-05 DIAGNOSIS — F329 Major depressive disorder, single episode, unspecified: Secondary | ICD-10-CM | POA: Insufficient documentation

## 2015-05-05 DIAGNOSIS — F419 Anxiety disorder, unspecified: Secondary | ICD-10-CM | POA: Diagnosis not present

## 2015-05-05 DIAGNOSIS — J45909 Unspecified asthma, uncomplicated: Secondary | ICD-10-CM | POA: Diagnosis not present

## 2015-05-05 DIAGNOSIS — T50902A Poisoning by unspecified drugs, medicaments and biological substances, intentional self-harm, initial encounter: Secondary | ICD-10-CM | POA: Diagnosis not present

## 2015-05-05 DIAGNOSIS — Z792 Long term (current) use of antibiotics: Secondary | ICD-10-CM | POA: Diagnosis not present

## 2015-05-05 DIAGNOSIS — Z87891 Personal history of nicotine dependence: Secondary | ICD-10-CM | POA: Diagnosis not present

## 2015-05-05 DIAGNOSIS — R45851 Suicidal ideations: Secondary | ICD-10-CM | POA: Diagnosis present

## 2015-05-05 HISTORY — DX: Gout, unspecified: M10.9

## 2015-05-05 NOTE — ED Notes (Signed)
Bed: YO06 Expected date:  Expected time:  Means of arrival:  Comments: EMS 57M Overdose Ritalin and Prozac

## 2015-05-05 NOTE — ED Notes (Addendum)
Pt transported by EMS from home after being found by son after taking Prozac 40mg  x 32 and unkn amount of Ritalin 10mg . Suicide note found at the scene per EMS. Pt continually stating she wants to die. Tearful.

## 2015-05-06 ENCOUNTER — Inpatient Hospital Stay (HOSPITAL_COMMUNITY)
Admission: AD | Admit: 2015-05-06 | Discharge: 2015-05-08 | DRG: 885 | Disposition: A | Discharge status: Home / Self Care | Payer: No Typology Code available for payment source | Source: Intra-hospital | Attending: Psychiatry | Admitting: Psychiatry

## 2015-05-06 ENCOUNTER — Encounter (HOSPITAL_COMMUNITY): Payer: Self-pay | Admitting: Oncology

## 2015-05-06 DIAGNOSIS — T404X2A Poisoning by other synthetic narcotics, intentional self-harm, initial encounter: Secondary | ICD-10-CM | POA: Diagnosis not present

## 2015-05-06 DIAGNOSIS — Z818 Family history of other mental and behavioral disorders: Secondary | ICD-10-CM

## 2015-05-06 DIAGNOSIS — E119 Type 2 diabetes mellitus without complications: Secondary | ICD-10-CM | POA: Diagnosis present

## 2015-05-06 DIAGNOSIS — I10 Essential (primary) hypertension: Secondary | ICD-10-CM | POA: Diagnosis present

## 2015-05-06 DIAGNOSIS — T380X5A Adverse effect of glucocorticoids and synthetic analogues, initial encounter: Secondary | ICD-10-CM | POA: Diagnosis present

## 2015-05-06 DIAGNOSIS — M25572 Pain in left ankle and joints of left foot: Secondary | ICD-10-CM

## 2015-05-06 DIAGNOSIS — F331 Major depressive disorder, recurrent, moderate: Principal | ICD-10-CM | POA: Diagnosis present

## 2015-05-06 DIAGNOSIS — T43222A Poisoning by selective serotonin reuptake inhibitors, intentional self-harm, initial encounter: Secondary | ICD-10-CM | POA: Diagnosis not present

## 2015-05-06 DIAGNOSIS — E785 Hyperlipidemia, unspecified: Secondary | ICD-10-CM | POA: Diagnosis present

## 2015-05-06 DIAGNOSIS — M109 Gout, unspecified: Secondary | ICD-10-CM | POA: Diagnosis present

## 2015-05-06 DIAGNOSIS — T50902A Poisoning by unspecified drugs, medicaments and biological substances, intentional self-harm, initial encounter: Secondary | ICD-10-CM | POA: Diagnosis not present

## 2015-05-06 DIAGNOSIS — Z87891 Personal history of nicotine dependence: Secondary | ICD-10-CM

## 2015-05-06 DIAGNOSIS — R45851 Suicidal ideations: Secondary | ICD-10-CM | POA: Diagnosis not present

## 2015-05-06 DIAGNOSIS — G4700 Insomnia, unspecified: Secondary | ICD-10-CM | POA: Diagnosis present

## 2015-05-06 DIAGNOSIS — F32A Depression, unspecified: Secondary | ICD-10-CM | POA: Diagnosis present

## 2015-05-06 DIAGNOSIS — R0602 Shortness of breath: Secondary | ICD-10-CM

## 2015-05-06 DIAGNOSIS — F332 Major depressive disorder, recurrent severe without psychotic features: Secondary | ICD-10-CM | POA: Insufficient documentation

## 2015-05-06 DIAGNOSIS — F329 Major depressive disorder, single episode, unspecified: Secondary | ICD-10-CM | POA: Diagnosis present

## 2015-05-06 DIAGNOSIS — J45909 Unspecified asthma, uncomplicated: Secondary | ICD-10-CM | POA: Diagnosis present

## 2015-05-06 DIAGNOSIS — M79672 Pain in left foot: Secondary | ICD-10-CM

## 2015-05-06 LAB — CBC
HEMATOCRIT: 37.5 % (ref 36.0–46.0)
Hemoglobin: 12.9 g/dL (ref 12.0–15.0)
MCH: 31.8 pg (ref 26.0–34.0)
MCHC: 34.4 g/dL (ref 30.0–36.0)
MCV: 92.4 fL (ref 78.0–100.0)
PLATELETS: 433 10*3/uL — AB (ref 150–400)
RBC: 4.06 MIL/uL (ref 3.87–5.11)
RDW: 12.8 % (ref 11.5–15.5)
WBC: 13.3 10*3/uL — ABNORMAL HIGH (ref 4.0–10.5)

## 2015-05-06 LAB — ACETAMINOPHEN LEVEL: Acetaminophen (Tylenol), Serum: <10 ug/mL — ABNORMAL LOW (ref 10–30)

## 2015-05-06 LAB — RAPID URINE DRUG SCREEN, HOSP PERFORMED
AMPHETAMINES: NOT DETECTED
BARBITURATES: NOT DETECTED
Benzodiazepines: POSITIVE — AB
COCAINE: NOT DETECTED
OPIATES: NOT DETECTED
Tetrahydrocannabinol: NOT DETECTED

## 2015-05-06 LAB — COMPREHENSIVE METABOLIC PANEL
ALBUMIN: 3.8 g/dL (ref 3.5–5.0)
ALT: 22 U/L (ref 14–54)
ANION GAP: 9 (ref 5–15)
AST: 18 U/L (ref 15–41)
Alkaline Phosphatase: 68 U/L (ref 38–126)
BILIRUBIN TOTAL: 0.3 mg/dL (ref 0.3–1.2)
BUN: 18 mg/dL (ref 6–20)
CHLORIDE: 105 mmol/L (ref 101–111)
CO2: 27 mmol/L (ref 22–32)
Calcium: 9.3 mg/dL (ref 8.9–10.3)
Creatinine, Ser: 0.99 mg/dL (ref 0.44–1.00)
Glucose, Bld: 80 mg/dL (ref 65–99)
Potassium: 3.6 mmol/L (ref 3.5–5.1)
Sodium: 141 mmol/L (ref 135–145)
Total Protein: 7.1 g/dL (ref 6.5–8.1)

## 2015-05-06 LAB — ETHANOL

## 2015-05-06 LAB — SALICYLATE LEVEL: Salicylate Lvl: <4 mg/dL (ref 2.8–30.0)

## 2015-05-06 MED ORDER — PRAVASTATIN SODIUM 40 MG PO TABS
40.0000 mg | ORAL_TABLET | Freq: Every evening | ORAL | Status: DC
  Administered 2015-05-06: 40 mg via ORAL
  Filled 2015-05-06: qty 1

## 2015-05-06 MED ORDER — LORAZEPAM 2 MG/ML IJ SOLN
INTRAMUSCULAR | Status: AC
  Filled 2015-05-06: qty 1

## 2015-05-06 MED ORDER — IBUPROFEN 200 MG PO TABS
600.0000 mg | ORAL_TABLET | Freq: Four times a day (QID) | ORAL | Status: DC | PRN
  Administered 2015-05-06: 600 mg via ORAL
  Filled 2015-05-06: qty 3

## 2015-05-06 MED ORDER — LORAZEPAM 2 MG/ML IJ SOLN
1.0000 mg | Freq: Once | INTRAMUSCULAR | Status: AC
  Administered 2015-05-06: 1 mg via INTRAVENOUS
  Filled 2015-05-06: qty 1

## 2015-05-06 MED ORDER — ALBUTEROL SULFATE HFA 108 (90 BASE) MCG/ACT IN AERS: 2.0000 | INHALATION_SPRAY | Freq: Four times a day (QID) | RESPIRATORY_TRACT | Status: DC | PRN

## 2015-05-06 MED ORDER — FLUOXETINE HCL 20 MG PO CAPS
20.0000 mg | ORAL_CAPSULE | Freq: Every day | ORAL | Status: DC
  Administered 2015-05-06: 20 mg via ORAL
  Filled 2015-05-06: qty 1

## 2015-05-06 MED ORDER — CALCIUM CARBONATE-VITAMIN D 500-200 MG-UNIT PO TABS
1.0000 | ORAL_TABLET | Freq: Every day | ORAL | Status: DC
  Administered 2015-05-06: 1 via ORAL
  Filled 2015-05-06: qty 1

## 2015-05-06 NOTE — ED Notes (Signed)
Pt alert x4, v/s stable, will tx to [facility] with [employer]. Will continue to monitor. Jeanie Sewer, RN 10:23 PM 05/06/2015

## 2015-05-06 NOTE — ED Notes (Addendum)
Daughter(Son)  would like to be updated when information and plan of care is updated/obtained. Lottie Dawson [phone]

## 2015-05-06 NOTE — ED Notes (Signed)
Visitor bedside

## 2015-05-06 NOTE — Consult Note (Signed)
[facility] Consult   Reason for Consult:  Depression, suicide attempt  Referring Physician:  ED Physician  Patient Identification: Haley White MRN:  [id number] Principal Diagnosis: Major Depression Diagnosis:   Patient Active Problem List   Diagnosis Date Noted  . Medication management [Z79.899] 08/22/2014  . Vitamin D deficiency [E55.9] 08/22/2014  . Hyperlipidemia [E78.5] 08/22/2014  . Hypertension [I10]   . Anxiety [F41.9]   . 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 Obese [E66.9]     Total Time spent with patient: 45 minutes  Subjective:   Haley White is a 59 y.o. female patient admitted with  Medication overdose   HPI:   59 year old woman. She is a Pharmacist, hospital. She is divorced .  She states she has been under a lot of stress recently, pertaining to  Being in the process of  Moving out of her current home and downsizing to a smaller home. She states that she has little support , and has been trying to do this essentially on her own, even though she had had a recent gout flare up and is now walking with crutches . She states that her brother is " pressing me all the time to complete the move and get out of the house ". " there is only so much I can do". She has been feeling depressed and anxious about these stressors and reports " I felt I was at the end of my rope ". She impulsively overdosed on Prozac and  Ritalin. She did write a suicide note . At this time she describes feeling " a little better", and denies any current  intention of hurting herself. She does report, however,  ongoing feelings of being overwhelmed, anxious, depressed . Denies any psychotic symptoms. Describes a prior history of  Depression, but denies any history of prior suicide attempts , denies history of psychosis. She was being prescribed Prozac and Xanax , which she states she was taking only occasionally. Denies alcohol or drug abuse .  HPI Elements:   Recent worsening depression , anxiety, and impulsive  suicide attempt by overdosing on medications. Severe psychosocial stressors reported .  Past Medical History:  Past Medical History  Diagnosis Date  . Depression   . Asthma   . Cataract   . Hypertension   . Anxiety   . Obese   . 250.00     not on med for diabetes  . Hyperlipidemia   . Arthritis   . Gout   . Suicide attempt     Past Surgical History  Procedure Laterality Date  . Knee surgery Left 1998  . Foot surgery Left 2002  . Appendectomy  1984  . Cataract extraction w/ intraocular lens  implant, bilateral  2014  . Cesarean section  1984, 1987, 1994    X3  . Tubal ligation  1994  . Rotator cuff repair Right 2007   Family History:  Family History  Problem Relation Age of Onset  . Dementia Father   . Anxiety disorder Sister   . Colon cancer Neg Hx    Social History:  History  Alcohol Use  . 3.6 oz/week  . 6 Glasses of wine per week    Comment: occasional     History  Drug Use No    History   Social History  . Marital Status: Divorced    Spouse Name: N/A  . Number of Children: N/A  . Years of Education: N/A   Social History Main Topics  .  Smoking status: Former Smoker    Quit date: 11/04/1977  . Smokeless tobacco: Never Used  . Alcohol Use: 3.6 oz/week    6 Glasses of wine per week     Comment: occasional  . Drug Use: No  . Sexual Activity: Yes    Birth Control/ Protection: Surgical   Other Topics Concern  . None   Social History Narrative   Additional Social History:    Pain Medications: pt denies abuse - see PTA meds list Prescriptions: pt denies abuse - see PTA meds list Over the Counter: pt denies abuse - see PTA meds list History of alcohol / drug use?: Yes Name of Substance 1: alcohol 1 - Age of First Use: 19 1 - Amount (size/oz): 12 oz 1 - Frequency: once every two weeks 1 - Last Use / Amount: unknown                   Allergies:   Allergies  Allergen Reactions  . Biaxin [Clarithromycin]     GI upset  . Serevent  [Salmeterol]     Palpitations  . Tequin [Gatifloxacin]     GI upset. thrush    Labs:  Results for orders placed or performed during the hospital encounter of 05/05/15 (from the past 48 hour(s))  CBC     Status: Abnormal   Collection Time: 05/06/15 12:19 AM  Result Value Ref Range   WBC 13.3 (H) 4.0 - 10.5 K/uL   RBC 4.06 3.87 - 5.11 MIL/uL   Hemoglobin 12.9 12.0 - 15.0 g/dL   HCT 37.5 36.0 - 46.0 %   MCV 92.4 78.0 - 100.0 fL   MCH 31.8 26.0 - 34.0 pg   MCHC 34.4 30.0 - 36.0 g/dL   RDW 12.8 11.5 - 15.5 %   Platelets 433 (H) 150 - 400 K/uL  Comprehensive metabolic panel     Status: None   Collection Time: 05/06/15 12:19 AM  Result Value Ref Range   Sodium 141 135 - 145 mmol/L   Potassium 3.6 3.5 - 5.1 mmol/L   Chloride 105 101 - 111 mmol/L   CO2 27 22 - 32 mmol/L   Glucose, Bld 80 65 - 99 mg/dL   BUN 18 6 - 20 mg/dL   Creatinine, Ser 0.99 0.44 - 1.00 mg/dL   Calcium 9.3 8.9 - 10.3 mg/dL   Total Protein 7.1 6.5 - 8.1 g/dL   Albumin 3.8 3.5 - 5.0 g/dL   AST 18 15 - 41 U/L   ALT 22 14 - 54 U/L   Alkaline Phosphatase 68 38 - 126 U/L   Total Bilirubin 0.3 0.3 - 1.2 mg/dL   GFR calc non Af Amer >60 >60 mL/min   GFR calc Af Amer >60 >60 mL/min    Comment: (NOTE) The eGFR has been calculated using the CKD EPI equation. This calculation has not been validated in all clinical situations. eGFR's persistently <60 mL/min signify possible Chronic Kidney Disease.    Anion gap 9 5 - 15  Ethanol (ETOH)     Status: None   Collection Time: 05/06/15 12:19 AM  Result Value Ref Range   Alcohol, Ethyl (B) <5 <5 mg/dL    Comment:        LOWEST DETECTABLE LIMIT FOR SERUM ALCOHOL IS 5 mg/dL FOR MEDICAL PURPOSES ONLY   Acetaminophen level     Status: Abnormal   Collection Time: 05/06/15 12:19 AM  Result Value Ref Range   Acetaminophen (Tylenol), Serum <10 (L) 10 -  30 ug/mL    Comment:        THERAPEUTIC CONCENTRATIONS VARY SIGNIFICANTLY. A RANGE OF 10-30 ug/mL MAY BE AN  EFFECTIVE CONCENTRATION FOR MANY PATIENTS. HOWEVER, SOME ARE BEST TREATED AT CONCENTRATIONS OUTSIDE THIS RANGE. ACETAMINOPHEN CONCENTRATIONS >150 ug/mL AT 4 HOURS AFTER INGESTION AND >50 ug/mL AT 12 HOURS AFTER INGESTION ARE OFTEN ASSOCIATED WITH TOXIC REACTIONS.   Salicylate level     Status: None   Collection Time: 05/06/15 12:19 AM  Result Value Ref Range   Salicylate Lvl <1.0 2.8 - 30.0 mg/dL  Urine rapid drug screen (hosp performed)not at [facility]     Status: Abnormal   Collection Time: 05/06/15 12:22 AM  Result Value Ref Range   Opiates NONE DETECTED NONE DETECTED   Cocaine NONE DETECTED NONE DETECTED   Benzodiazepines POSITIVE (A) NONE DETECTED   Amphetamines NONE DETECTED NONE DETECTED   Tetrahydrocannabinol NONE DETECTED NONE DETECTED   Barbiturates NONE DETECTED NONE DETECTED    Comment:        DRUG SCREEN FOR MEDICAL PURPOSES ONLY.  IF CONFIRMATION IS NEEDED FOR ANY PURPOSE, NOTIFY LAB WITHIN 5 DAYS.        LOWEST DETECTABLE LIMITS FOR URINE DRUG SCREEN Drug Class       Cutoff (ng/mL) Amphetamine      1000 Barbiturate      200 Benzodiazepine   272 Tricyclics       536 Opiates          300 Cocaine          300 THC              50     Vitals: Blood pressure 169/101, pulse 85, temperature 98.6 F (37 C), temperature source Oral, resp. rate 20, last menstrual period 11/26/2011, SpO2 96 %.  Risk to Self: Suicidal Ideation: Yes-Currently Present Suicidal Intent: No Is patient at risk for suicide?: Yes Suicidal Plan?:  (pt attempted overdose last night) Access to Means: Yes Specify Access to Suicidal Means: access to meds What has been your use of drugs/alcohol within the last 12 months?: drinks alcohol once every two weeks How many times?: 0 Other Self Harm Risks: none Triggers for Past Attempts: Other (Comment), Family contact (last night d/t physical pain, "pressure" re: downsizing home) Intentional Self Injurious Behavior: None Risk to Others: Homicidal  Ideation: No Thoughts of Harm to Others: No Current Homicidal Intent: No Current Homicidal Plan: No Access to Homicidal Means: No Identified Victim: none History of harm to others?: No Assessment of Violence: None Noted Violent Behavior Description: pt denies hx violence - is calm and cooperative Does patient have access to weapons?: No Criminal Charges Pending?: No Does patient have a court date: No Prior Inpatient Therapy: Prior Inpatient Therapy: No Prior Therapy Dates: na Prior Therapy Facilty/Provider(s): na Reason for Treatment: na Prior Outpatient Therapy: Prior Outpatient Therapy: Yes Prior Therapy Dates: currently Prior Therapy Facilty/Provider(s): [facility] Reason for Treatment: MDD, med management Does patient have an ACCT team?: No Does patient have Intensive In-House Services?  : No Does patient have [facility] services? : No Does patient have [facility] services?: No  Current Facility-Administered Medications  Medication Dose Route Frequency Provider Last Rate Last Dose  . albuterol (PROVENTIL) (2.5 MG/3ML) 0.083% nebulizer solution 2.5 mg  2.5 mg Nebulization Once Heather M Marte, PA-C      . ibuprofen (ADVIL,MOTRIN) tablet 600 mg  600 mg Oral Q6H PRN Patrecia Pour, NP   600 mg at 05/06/15 1221  .  ipratropium (ATROVENT) nebulizer solution 0.5 mg  0.5 mg Nebulization Once Collene Leyden, PA-C       Current Outpatient Prescriptions  Medication Sig Dispense Refill  . albuterol (PROVENTIL HFA;VENTOLIN HFA) 108 (90 BASE) MCG/ACT inhaler Inhale 2 puffs into the lungs every 6 (six) hours as needed. wheezing 8.5 g 2  . Ascorbic Acid (VITAMIN C PO) Take 1 tablet by mouth daily.    . Calcium Carbonate-Vitamin D (CALCIUM + D PO) Take 1 tablet by mouth daily.    . diclofenac (VOLTAREN) 50 MG EC tablet Take 1 tablet (50 mg total) by mouth 3 (three) times daily. 30 tablet 0  . FLUoxetine (PROZAC) 20 MG tablet Take 20 mg by mouth daily.    Marland Kitchen lidocaine (XYLOCAINE) 2 % solution Use as  directed 20 mLs in the mouth or throat every 6 (six) hours as needed for mouth pain. 100 mL 0  . methylphenidate 54 MG PO CR tablet Take 54 mg by mouth every morning.    Marland Kitchen oxyCODONE-acetaminophen (PERCOCET/ROXICET) 5-325 MG per tablet Take 1-2 tablets by mouth every 4 (four) hours as needed for moderate pain or severe pain. 20 tablet 0  . Phenyleph-Promethazine-Cod 5-6.25-10 MG/5ML SYRP Take 5 mLs by mouth at bedtime as needed (severe cough). 180 mL 0  . amoxicillin-clavulanate (AUGMENTIN) 875-125 MG per tablet Take 1 tablet by mouth 2 (two) times daily. One po bid x 7 days (Patient not taking: Reported on 05/06/2015) 14 tablet 0  . ipratropium-albuterol (DUONEB) 0.5-2.5 (3) MG/3ML SOLN Take 3 mLs by nebulization every 4 (four) hours as needed. (Patient taking differently: Take 3 mLs by nebulization every 4 (four) hours as needed. For shortness of breath) 360 mL 3  . pravastatin (PRAVACHOL) 40 MG tablet Take 1 tablet (40 mg total) by mouth every evening. 30 tablet 11    Musculoskeletal: Strength & Muscle Tone: within normal limits Gait & Station: not examined  Patient leans: N/A  Psychiatric Specialty Exam: Physical Exam  ROS- denies chest pain, denies SOB,   Blood pressure 169/101, pulse 85, temperature 98.6 F (37 C), temperature source Oral, resp. rate 20, last menstrual period 11/26/2011, SpO2 96 %.There is no weight on file to calculate BMI.  General Appearance: Fairly Groomed  Engineer, water::  Good  Speech:  Normal Rate  Volume:  Normal  Mood:  Depressed  Affect:  Constricted  Thought Process:  Goal Directed and Linear  Orientation:  Other:  alert and attentive   Thought Content:  Rumination and denies hallucinations, no delusions expressed- ruminative about stressors as above   Suicidal Thoughts:  No- at this time denies intention of hurting self- suicide attempt by overdose prior to admission  Homicidal Thoughts:  No  Memory:  Recent and remote grossly intact   Judgement:  Fair   Insight:  Present  Psychomotor Activity:  Decreased  Concentration:  Good  Recall:  Good  Fund of Knowledge:Good  Language: Good  Akathisia:  Negative  Handed:  Right  AIMS (if indicated):     Assets:  Desire for Improvement Resilience Vocational/Educational  ADL's:  fair  Cognition: WNL  Sleep:      Medical Decision Making: Review of Psycho-Social Stressors (1), Review or order clinical lab tests (1), Established Problem, Worsening (2) and Review of Medication Regimen & Side Effects (2)  Treatment Plan Summary: Patient warrants inpatient psychiatric admission , due to severirty of depression, serious suicide attempt  Plan:  Recommend psychiatric Inpatient admission when medically cleared.   Ruthene Methvin  05/06/2015 12:38 PM

## 2015-05-06 NOTE — ED Provider Notes (Signed)
CSN: [id number]     Arrival date & time 05/05/15  2345 History   First MD Initiated Contact with Patient 05/06/15 0005     Chief Complaint  Patient presents with  . Suicide Attempt     (Consider location/radiation/quality/duration/timing/severity/associated sxs/prior Treatment) HPI Patient presents by EMS after son found patient with suicide note having taken 3240 mg Prozac and an unknown amount of 10 milligrams Ritalin tabs. Patient is tearful stating that she was trying to kill herself. She's been off her Prozac for the past month. She states she's had increased pressure from the family while moving and also dealing with gout in her foot. She states she "can't take it anymore". She has no previous suicide attempts. She denies any drug or alcohol use. Past Medical History  Diagnosis Date  . Depression   . Asthma   . Cataract   . Hypertension   . Anxiety   . Obese   . 250.00     not on med for diabetes  . Hyperlipidemia   . Arthritis   . Gout   . Suicide attempt    Past Surgical History  Procedure Laterality Date  . Knee surgery Left 1998  . Foot surgery Left 2002  . Appendectomy  1984  . Cataract extraction w/ intraocular lens  implant, bilateral  2014  . Cesarean section  1984, 1987, 1994    X3  . Tubal ligation  1994  . Rotator cuff repair Right 2007   Family History  Problem Relation Age of Onset  . Dementia Father   . Anxiety disorder Sister   . Colon cancer Neg Hx    History  Substance Use Topics  . Smoking status: Former Smoker    Quit date: 11/04/1977  . Smokeless tobacco: Never Used  . Alcohol Use: 3.6 oz/week    6 Glasses of wine per week     Comment: occasional   OB History    No data available     Review of Systems  Constitutional: Negative for fever and chills.  Respiratory: Negative for shortness of breath.   Cardiovascular: Negative for chest pain.  Gastrointestinal: Negative for nausea, vomiting and abdominal pain.  Musculoskeletal:  Positive for arthralgias. Negative for myalgias, neck pain and neck stiffness.  Skin: Negative for rash and wound.  Neurological: Negative for dizziness, weakness, light-headedness, numbness and headaches.  Psychiatric/Behavioral: Positive for suicidal ideas and dysphoric mood.  All other systems reviewed and are negative.     Allergies  Biaxin; Serevent; and Tequin  Home Medications   Prior to Admission medications   Medication Sig Start Date End Date Taking? Authorizing Provider  albuterol (PROVENTIL HFA;VENTOLIN HFA) 108 (90 BASE) MCG/ACT inhaler Inhale 2 puffs into the lungs every 6 (six) hours as needed. wheezing 11/19/13  Yes Vicie Mutters, PA-C  Ascorbic Acid (VITAMIN C PO) Take 1 tablet by mouth daily.   Yes Historical Provider, MD  Calcium Carbonate-Vitamin D (CALCIUM + D PO) Take 1 tablet by mouth daily.   Yes Historical Provider, MD  diclofenac (VOLTAREN) 50 MG EC tablet Take 1 tablet (50 mg total) by mouth 3 (three) times daily. 04/27/15  Yes Posey Boyer, MD  FLUoxetine (PROZAC) 20 MG tablet Take 20 mg by mouth daily.   Yes Historical Provider, MD  lidocaine (XYLOCAINE) 2 % solution Use as directed 20 mLs in the mouth or throat every 6 (six) hours as needed for mouth pain. 04/25/15  Yes Courtney Forcucci, PA-C  methylphenidate 54 MG PO CR  tablet Take 54 mg by mouth every morning.   Yes Historical Provider, MD  oxyCODONE-acetaminophen (PERCOCET/ROXICET) 5-325 MG per tablet Take 1-2 tablets by mouth every 4 (four) hours as needed for moderate pain or severe pain. 05/01/15  Yes Clayton Bibles, PA-C  Phenyleph-Promethazine-Cod 5-6.25-10 MG/5ML SYRP Take 5 mLs by mouth at bedtime as needed (severe cough). 04/25/15  Yes Courtney Forcucci, PA-C  amoxicillin-clavulanate (AUGMENTIN) 875-125 MG per tablet Take 1 tablet by mouth 2 (two) times daily. One po bid x 7 days Patient not taking: Reported on 05/06/2015 04/25/15   Loma Sousa Forcucci, PA-C  ipratropium-albuterol (DUONEB) 0.5-2.5 (3) MG/3ML  SOLN Take 3 mLs by nebulization every 4 (four) hours as needed. Patient taking differently: Take 3 mLs by nebulization every 4 (four) hours as needed. For shortness of breath 01/03/14   Kelby Aline, PA-C  pravastatin (PRAVACHOL) 40 MG tablet Take 1 tablet (40 mg total) by mouth every evening. 04/03/15 04/02/16  Courtney Forcucci, PA-C   BP 136/88 mmHg  Pulse 96  Resp 19  SpO2 95%  LMP 11/26/2011 Physical Exam  Constitutional: She is oriented to person, place, and time. She appears well-developed and well-nourished. No distress.  Tearful  HENT:  Head: Normocephalic and atraumatic.  Mouth/Throat: Oropharynx is clear and moist.  Eyes: EOM are normal. Pupils are equal, round, and reactive to light.  Neck: Normal range of motion. Neck supple.  Cardiovascular: Normal rate and regular rhythm.   Pulmonary/Chest: Effort normal and breath sounds normal. No respiratory distress. She has no wheezes. She has no rales.  Abdominal: Soft. Bowel sounds are normal. She exhibits no distension and no mass. There is no tenderness. There is no rebound and no guarding.  Musculoskeletal: Normal range of motion. She exhibits no edema or tenderness.  Neurological: She is alert and oriented to person, place, and time.  Moves all extremities without deficit. Sensation is grossly intact.  Skin: Skin is warm and dry. No rash noted. No erythema.  Psychiatric:  Actively suicidal. Denies hallucinations.  Nursing note and vitals reviewed.   ED Course  Procedures (including critical care time) Labs Review Labs Reviewed  CBC - Abnormal; Notable for the following:    WBC 13.3 (*)    Platelets 433 (*)    All other components within normal limits  ACETAMINOPHEN LEVEL - Abnormal; Notable for the following:    Acetaminophen (Tylenol), Serum <10 (*)    All other components within normal limits  URINE RAPID DRUG SCREEN, HOSP PERFORMED - Abnormal; Notable for the following:    Benzodiazepines POSITIVE (*)    All other  components within normal limits  COMPREHENSIVE METABOLIC PANEL  ETHANOL  SALICYLATE LEVEL    Imaging Review No results found.   EKG Interpretation   Date/Time:  Friday May 05 2015 23:53:14 EDT Ventricular Rate:  95 PR Interval:  135 QRS Duration: 91 QT Interval:  349 QTC Calculation: 439 R Axis:   54 Text Interpretation:  Sinus rhythm Probable anterior infarct, old Minimal  ST depression Confirmed by Lita Mains  MD, Christyl Osentoski ([id number]) on 05/06/2015  1:06:21 AM      MDM   Final diagnoses:  Suicide attempt by drug ingestion, initial encounter   Aren't contacted poison control. Recommended observation for 6 hours. Ativan when necessary for agitation due to Ritalin.  Patient required several doses of Ativan. She is now calm. She is voluntary. Tachycardia is resolved. Patient is medically clear and can safely be evaluated by the psychiatric team this point.     Shanon Brow  Lita Mains, MD 05/06/15 [phone]

## 2015-05-06 NOTE — BH Assessment (Signed)
Assessment Note  Haley White is an 59 y.o. female. Pt presents voluntarily BIB by EMS d/t intentional overdose on medications. Pt sts her son called EMS. Pt is cooperative and soft spoken. She is oriented x 4. Pt's affect is depressed and she reports depressed mood. Pt reports last night she became overwhelmed by stressors. Pt sts she developed gout recently and walks with crutches. She reports gout causes her extreme pain. Pt sts she is in the process of downsizing homes from a 5 bedroom to a 2 bedroom home. She reports her mom and brother are putting "pressure" on her to finish the move so they can put the bigger home on the market. She says she was thinking "I just can't do this anymore" and "I'm tired of all this". She says that she ingested two Tramadol last night to help her sleep. She then says she took the rest of the pills she had to kill herself. Pt says she wrote an email to her daughter and son last night. She says she doesn't remember what she wrote but she did write that she "loved them." Pt also says last night she wrote "on a piece of paper" to her kids that she "was worry I've caused you so much pain." Pt unsure where her written note is. Pt denies previous suicide attempts. She reports she sees Dr Chucky May for MDD and med management. Pt says she ran out of her psych meds a few weeks ago and hasn't taken them. Pt endorses isolating bx, loss of interest is usual pleasures, fatigue, guilt, worthlessness. She sts she first experienced depressive sxs when she has post partum depression in 1987 after birth of her daughter. 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 Writer ran pt by Waylan Boga DNP who agrees with writer that pt needs inpatient treatment. Writer then discussed recommended disposition with EDP [place] who was in agreement with plan.  Axis I:  Major Depressive Disorder, Recurrent, Severe Axis II: Deferred Axis III:  Past Medical History  Diagnosis Date  .  Depression   . Asthma   . Cataract   . Hypertension   . Anxiety   . Obese   . 250.00     not on med for diabetes  . Hyperlipidemia   . Arthritis   . Gout   . Suicide attempt    Axis IV: other psychosocial or environmental problems, problems related to social environment and problems with primary support group Axis V: 31-40 impairment in reality testing  Past Medical History:  Past Medical History  Diagnosis Date  . Depression   . Asthma   . Cataract   . Hypertension   . Anxiety   . Obese   . 250.00     not on med for diabetes  . Hyperlipidemia   . Arthritis   . Gout   . Suicide attempt     Past Surgical History  Procedure Laterality Date  . Knee surgery Left 1998  . Foot surgery Left 2002  . Appendectomy  1984  . Cataract extraction w/ intraocular lens  implant, bilateral  2014  . Cesarean section  1984, 1987, 1994    X3  . Tubal ligation  1994  . Rotator cuff repair Right 2007    Family History:  Family History  Problem Relation Age of Onset  . Dementia Father   . Anxiety disorder Sister   . Colon cancer Neg Hx     Social History:  reports  that she quit smoking about 37 years ago. She has never used smokeless tobacco. She reports that she drinks about 3.6 oz of alcohol per week. She reports that she does not use illicit drugs.  Additional Social History:  Alcohol / Drug Use Pain Medications: pt denies abuse - see PTA meds list Prescriptions: pt denies abuse - see PTA meds list Over the Counter: pt denies abuse - see PTA meds list History of alcohol / drug use?: Yes Substance #1 Name of Substance 1: alcohol 1 - Age of First Use: 19 1 - Amount (size/oz): 12 oz 1 - Frequency: once every two weeks 1 - Last Use / Amount: unknown  CIWA: CIWA-Ar BP: 132/86 mmHg Pulse Rate: 85 COWS:    Allergies:  Allergies  Allergen Reactions  . Biaxin [Clarithromycin]     GI upset  . Serevent [Salmeterol]     Palpitations  . Tequin [Gatifloxacin]     GI  upset. thrush    Home Medications:  (Not in a hospital admission)  OB/GYN Status:  Patient's last menstrual period was 11/26/2011.  General Assessment Data Location of Assessment: [facility] TTS Assessment: In system Is this a Tele or Face-to-Face Assessment?: Face-to-Face Is this an Initial Assessment or a Re-assessment for this encounter?: Initial Assessment Marital status: Divorced Is patient pregnant?: No Living Arrangements: Alone Can pt return to current living arrangement?: Yes Admission Status: Voluntary Is patient capable of signing voluntary admission?: Yes Referral Source: Self/Family/Friend Insurance type: [place] Living Arrangements: Alone Name of Psychiatrist: Layla Barter Name of Therapist: none  Education Status Is patient currently in school?: No Highest grade of school patient has completed: 22 Name of school: [employer] grad  Risk to self with the past 6 months Suicidal Ideation: Yes-Currently Present Has patient been a risk to self within the past 6 months prior to admission? : Yes Suicidal Intent: No Has patient had any suicidal intent within the past 6 months prior to admission? : Yes Is patient at risk for suicide?: Yes Suicidal Plan?:  (pt attempted overdose last night) Has patient had any suicidal plan within the past 6 months prior to admission? : Yes Access to Means: Yes Specify Access to Suicidal Means: access to meds What has been your use of drugs/alcohol within the last 12 months?: drinks alcohol once every two weeks Previous Attempts/Gestures: No How many times?: 0 Other Self Harm Risks: none Triggers for Past Attempts: Other (Comment), Family contact (last night d/t physical pain, "pressure" re: downsizing home) Intentional Self Injurious Behavior: None Family Suicide History: Unknown Recent stressful life event(s): Recent negative physical changes, Other (Comment) (gout, downsizing to smaller home) Persecutory  voices/beliefs?: No Depression: Yes Depression Symptoms: Isolating, Fatigue, Guilt, Loss of interest in usual pleasures, Feeling worthless/self pity Substance abuse history and/or treatment for substance abuse?: No Suicide prevention information given to non-admitted patients: Not applicable  Risk to Others within the past 6 months Homicidal Ideation: No Does patient have any lifetime risk of violence toward others beyond the six months prior to admission? : No Thoughts of Harm to Others: No Current Homicidal Intent: No Current Homicidal Plan: No Access to Homicidal Means: No Identified Victim: none History of harm to others?: No Assessment of Violence: None Noted Violent Behavior Description: pt denies hx violence - is calm and cooperative Does patient have access to weapons?: No Criminal Charges Pending?: No Does patient have a court date: No Is patient on probation?: No  Psychosis Hallucinations: None  noted Delusions: None noted  Mental Status Report Appearance/Hygiene: Unremarkable, In hospital gown Eye Contact: Good Motor Activity: Freedom of movement Speech: Logical/coherent, Soft Level of Consciousness: Alert Mood: Depressed, Sad, Anhedonia Affect: Appropriate to circumstance, Sad, Depressed Anxiety Level: Minimal Thought Processes: Relevant, Coherent Judgement: Unimpaired Orientation: Person, Place, Time, Situation Obsessive Compulsive Thoughts/Behaviors: None  Cognitive Functioning Concentration: Normal Memory: Remote Intact, Recent Intact IQ: Average Insight: Good Impulse Control: Poor Appetite: Fair Sleep: No Change Total Hours of Sleep: 8 Vegetative Symptoms: None  ADLScreening [facility] Assessment Services) Patient's cognitive ability adequate to safely complete daily activities?: Yes Patient able to express need for assistance with ADLs?: Yes Independently performs ADLs?: No (Can perform ADLs but walks w/ crutches d/t gout)  Prior Inpatient  Therapy Prior Inpatient Therapy: No Prior Therapy Dates: na Prior Therapy Facilty/Provider(s): na Reason for Treatment: na  Prior Outpatient Therapy Prior Outpatient Therapy: Yes Prior Therapy Dates: currently Prior Therapy Facilty/Provider(s): [facility] Reason for Treatment: MDD, med management Does patient have an ACCT team?: No Does patient have Intensive In-House Services?  : No Does patient have [facility] services? : No Does patient have P4CC services?: No  ADL Screening (condition at time of admission) Patient's cognitive ability adequate to safely complete daily activities?: Yes Is the patient deaf or have difficulty hearing?: No Does the patient have difficulty seeing, even when wearing glasses/contacts?: No Does the patient have difficulty concentrating, remembering, or making decisions?: No Patient able to express need for assistance with ADLs?: Yes Does the patient have difficulty dressing or bathing?: No Independently performs ADLs?: No (Can perform ADLs but walks w/ crutches d/t gout) Communication: Independent Dressing (OT): Independent Grooming: Independent Feeding: Independent Bathing: Independent Toileting: Independent In/Out Bed: Independent Walks in Home: Independent with device (comment) (crutches d/t gout) Does the patient have difficulty walking or climbing stairs?: Yes (due to gout) Weakness of Legs: None Weakness of Arms/Hands: None  Home Assistive Devices/Equipment Home Assistive Devices/Equipment: Crutches    Abuse/Neglect Assessment (Assessment to be complete while patient is alone) Physical Abuse: Denies Verbal Abuse: Denies Sexual Abuse: Denies Exploitation of patient/patient's resources: Denies Self-Neglect: Denies     Regulatory affairs officer (For Healthcare) Does patient have an advance directive?: No Would patient like information on creating an advanced directive?: No - patient declined information    Additional Information 1:1 In Past 12  Months?: No CIRT Risk: No Elopement Risk: No Does patient have medical clearance?: Yes     Disposition:  Disposition Initial Assessment Completed for this Encounter: Yes Disposition of Patient:  (pt to be evaluated by psych team today)  On Site Evaluation by:   Reviewed with Physician:    Leron Croak P 05/06/2015 8:12 AM

## 2015-05-06 NOTE — ED Notes (Signed)
Pt changed into scrubs and wanded by security  

## 2015-05-06 NOTE — ED Notes (Signed)
Pt currently denying SI/HI/AV.  Sts "it just got too much."

## 2015-05-06 NOTE — ED Notes (Signed)
MD at bedside. 

## 2015-05-06 NOTE — Progress Notes (Signed)
Disposition CSW completed referrals for patient to the following inpatient psych facilities:  [facility]  CSW will continue to assist with patient's placement needs.  [facility] CSW [phone]

## 2015-05-06 NOTE — ED Notes (Signed)
Pt alert x4, resting , sitter at the bedside, no c/o pain. Will continue to monitor. Jeanie Sewer, RN 8:09 PM 05/06/2015

## 2015-05-06 NOTE — ED Notes (Signed)
Meds picked up from pharmacy will send over with belongings. Si Gaul Mahario 11:09 PM 05/06/2015

## 2015-05-06 NOTE — BHH Counselor (Signed)
Per Letitia Libra [facility], pt accepted to bed 407-1. Support paperwork signed and faxed to [facility]. [employer] placed in pt's chart.  Arnold Long, [place] Therapeutic Triage Specialist

## 2015-05-06 NOTE — ED Notes (Signed)
Phone call received from poison control. Update provided.  Reported that pt has been cleared.

## 2015-05-06 NOTE — ED Notes (Signed)
Bed: WA26 Expected date:  Expected time:  Means of arrival:  Comments: 

## 2015-05-06 NOTE — ED Notes (Signed)
Attempted to call report to [facility] and informed that the RNs are still in report.  Will attempt again at 0730.

## 2015-05-06 NOTE — ED Notes (Addendum)
Attempted to call report to [facility], RN unable to receive report at this time will continue to monitor. Jeanie Sewer, RN 9:27 PM 05/06/2015

## 2015-05-07 ENCOUNTER — Encounter (HOSPITAL_COMMUNITY): Payer: Self-pay | Admitting: *Deleted

## 2015-05-07 DIAGNOSIS — F32A Depression, unspecified: Secondary | ICD-10-CM | POA: Diagnosis present

## 2015-05-07 DIAGNOSIS — F329 Major depressive disorder, single episode, unspecified: Secondary | ICD-10-CM

## 2015-05-07 DIAGNOSIS — T380X5A Adverse effect of glucocorticoids and synthetic analogues, initial encounter: Secondary | ICD-10-CM | POA: Diagnosis present

## 2015-05-07 DIAGNOSIS — R45851 Suicidal ideations: Secondary | ICD-10-CM

## 2015-05-07 DIAGNOSIS — T404X2A Poisoning by other synthetic narcotics, intentional self-harm, initial encounter: Secondary | ICD-10-CM

## 2015-05-07 MED ORDER — COLCHICINE 0.6 MG PO TABS
1.2000 mg | ORAL_TABLET | Freq: Once | ORAL | Status: AC
  Administered 2015-05-07: 1.2 mg via ORAL
  Filled 2015-05-07 (×3): qty 2

## 2015-05-07 MED ORDER — COLCHICINE 0.6 MG PO TABS
0.6000 mg | ORAL_TABLET | ORAL | Status: DC | PRN
  Filled 2015-05-07: qty 1

## 2015-05-07 MED ORDER — IBUPROFEN 600 MG PO TABS
600.0000 mg | ORAL_TABLET | Freq: Four times a day (QID) | ORAL | Status: DC | PRN
  Administered 2015-05-08: 600 mg via ORAL
  Filled 2015-05-07 (×2): qty 1

## 2015-05-07 MED ORDER — TRAZODONE HCL 50 MG PO TABS
50.0000 mg | ORAL_TABLET | Freq: Every evening | ORAL | Status: DC | PRN
  Administered 2015-05-07 – 2015-05-08 (×3): 50 mg via ORAL
  Filled 2015-05-07 (×2): qty 1
  Filled 2015-05-07: qty 6
  Filled 2015-05-07: qty 1

## 2015-05-07 MED ORDER — ACETAMINOPHEN 325 MG PO TABS: 650.0000 mg | ORAL_TABLET | Freq: Four times a day (QID) | ORAL | Status: DC | PRN

## 2015-05-07 MED ORDER — FLUOXETINE HCL 20 MG PO CAPS
20.0000 mg | ORAL_CAPSULE | Freq: Every day | ORAL | Status: DC
  Administered 2015-05-08: 20 mg via ORAL
  Filled 2015-05-07: qty 1
  Filled 2015-05-07: qty 3
  Filled 2015-05-07: qty 1

## 2015-05-07 MED ORDER — TRAMADOL HCL 50 MG PO TABS
50.0000 mg | ORAL_TABLET | Freq: Once | ORAL | Status: AC
  Administered 2015-05-07: 50 mg via ORAL
  Filled 2015-05-07: qty 1

## 2015-05-07 MED ORDER — MAGNESIUM HYDROXIDE 400 MG/5ML PO SUSP: 30.0000 mL | Freq: Every day | ORAL | Status: DC | PRN

## 2015-05-07 MED ORDER — BUSPIRONE HCL 10 MG PO TABS
10.0000 mg | ORAL_TABLET | Freq: Two times a day (BID) | ORAL | Status: DC
  Administered 2015-05-07 – 2015-05-08 (×2): 10 mg via ORAL
  Filled 2015-05-07: qty 1
  Filled 2015-05-07: qty 2
  Filled 2015-05-07: qty 9
  Filled 2015-05-07: qty 1
  Filled 2015-05-07: qty 9
  Filled 2015-05-07 (×2): qty 1

## 2015-05-07 MED ORDER — ALUM & MAG HYDROXIDE-SIMETH 200-200-20 MG/5ML PO SUSP: 30.0000 mL | ORAL | Status: DC | PRN

## 2015-05-07 NOTE — BHH Suicide Risk Assessment (Signed)
[facility] Admission Suicide Risk Assessment   Nursing information obtained from:    Demographic factors:    Current Mental Status:    Loss Factors:    Historical Factors:    Risk Reduction Factors:    Total Time spent with patient: 45 minutes Principal Problem: Steroid-induced depression Diagnosis:   Patient Active Problem List   Diagnosis Date Noted  . 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 Suicide attempt by drug ingestion [T50.902A]   . Medication management [Z79.899] 08/22/2014  . Vitamin D deficiency [E55.9] 08/22/2014  . Hyperlipidemia [E78.5] 08/22/2014  . Hypertension [I10]   . Anxiety [F41.9]   . 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 Obese [E66.9]      Continued Clinical Symptoms:  Alcohol Use Disorder Identification Test Final Score (AUDIT): 2 The "Alcohol Use Disorders Identification Test", Guidelines for Use in Primary Care, Second Edition.  World Pharmacologist [facility]). Score between 0-7:  no or low risk or alcohol related problems. Score between 8-15:  moderate risk of alcohol related problems. Score between 16-19:  high risk of alcohol related problems. Score 20 or above:  warrants further diagnostic evaluation for alcohol dependence and treatment.   CLINICAL FACTORS:   Depression:   Impulsivity   Musculoskeletal: Strength & Muscle Tone: within normal limits Gait & Station: normal Patient leans: normally  Psychiatric Specialty Exam: Physical Exam  Review of Systems  Constitutional: Positive for malaise/fatigue.  Respiratory: Negative.   Cardiovascular: Negative.   Gastrointestinal: Positive for nausea and diarrhea.  Genitourinary: Negative.   Musculoskeletal: Positive for joint pain.  Neurological: Positive for headaches.  Endo/Heme/Allergies: Negative.   Psychiatric/Behavioral: Positive for depression.    Blood pressure 137/54, pulse 76, temperature 98.4 F (36.9 C), temperature source Oral, resp. rate 16, height 5\' 4"  (1.626 m), weight 84.823 kg (187  lb), last menstrual period 11/26/2011.Body mass index is 32.08 kg/(m^2).  General Appearance: Fairly Groomed  Engineer, water::  Fair  Speech:  Clear and Coherent  Volume:  Normal  Mood:  Anxious  Affect:  Appropriate  Thought Process:  Coherent and Goal Directed  Orientation:  Full (Time, Place, and Person)  Thought Content:  symptoms events worries concerns  Suicidal Thoughts:  No  Homicidal Thoughts:  No  Memory:  Immediate;   Fair Recent;   Fair Remote;   Fair  Judgement:  Fair  Insight:  Present  Psychomotor Activity:  Normal  Concentration:  Fair  Recall:  [employer] [place]  Language: Fair  Akathisia:  No  Handed:  Right  AIMS (if indicated):     Assets:  Desire for Improvement Housing Social Support  Sleep:  Number of Hours: 5  Cognition: WNL  ADL's:  Intact     COGNITIVE FEATURES THAT CONTRIBUTE TO RISK:  None    SUICIDE RISK:   Minimal: No identifiable suicidal ideation.  Patients presenting with no risk factors but with morbid ruminations; may be classified as minimal risk based on the severity of the depressive symptoms 59 Y/o female who states that she was suffering from pain coming from gout. She was given steroids. She admits she has had negative responses to steroids before. She has history of depression starting with a post partum depressive episode. She has done well on Prozac. She admits she has been dealing with some stressors feeling overwhelmed  but they are resolving. She states she got very agitated in front of her son and impulsively  started ingesting pills from the bottles she had in front of her. Her  son called [phone] PLAN OF CARE: Supportive approach/coping skills                              Depression; will evaluate further and reassess the use of Prozac                              Assess further in terms of the steroids causing this behavior                              Will get collateral information                              Will  address the gout as per DNP                                Medical Decision Making:  Review of Psycho-Social Stressors (1), Review or order clinical lab tests (1), Review of Medication Regimen & Side Effects (2) and Review of New Medication or Change in Dosage (2)  I certify that inpatient services furnished can reasonably be expected to improve the patient's condition.   Karson Chicas A 05/07/2015, 6:14 PM

## 2015-05-07 NOTE — Tx Team (Signed)
Initial Interdisciplinary Treatment Plan   PATIENT STRESSORS: Health problems Marital or family conflict   PATIENT STRENGTHS: Ability for insight Average or above average intelligence Capable of independent living General fund of knowledge Motivation for treatment/growth Supportive family/friends   PROBLEM LIST: Problem List/Patient Goals Date to be addressed Date deferred Reason deferred Estimated date of resolution  "Feeling guilty" 05/07/15     Depression 05/07/15     Suicidal Ideation 05/07/15                                          DISCHARGE CRITERIA:  Ability to meet basic life and health needs Improved stabilization in mood, thinking, and/or behavior Verbal commitment to aftercare and medication compliance  PRELIMINARY DISCHARGE PLAN: Attend aftercare/continuing care group Return to previous living arrangement  PATIENT/FAMIILY INVOLVEMENT: This treatment plan has been presented to and reviewed with the patient, Haley White, and/or family member, .  The patient and family have been given the opportunity to ask questions and make suggestions.  [place], [place] 05/07/2015, 12:34 AM

## 2015-05-07 NOTE — BHH Group Notes (Signed)
[facility] LCSW Group Therapy  05/07/2015 11:15 AM  Type of Therapy:  Group Therapy  Participation Level:  Active  Participation Quality:  Appropriate and Sharing  Affect:  Depressed, Flat and Tearful  Cognitive:  Oriented  Insight:  Developing/Improving  Engagement in Therapy:  Engaged  Modes of Intervention:  Discussion, Exploration, Socialization and Support  Summary of Progress/Problems: Topic for today was thoughts and feelings regarding discharge. We discussed fears of upcoming changes including judegements, expectations and stigma of mental health issues. We then discussed supports: what constitutes a supportive framework, identification of supports and what to do when others are not supportive. Patient's then identified a specific coping tool to use when others are not available.  Patient attended her first group therapy session of admit and engaged easily in discussion. She shared she is looking forward to a family trip this fall yet later shared same family members often make dismissive comments about her issues. Patient was able to share importance of outside family supports and shared that she was more comfortable calling a friend to meet her at ED verses family. Patient reports devotional readings are often helpful to her.   Sheilah Pigeon, LCSW

## 2015-05-07 NOTE — H&P (Signed)
Psychiatric Admission Assessment Adult  Patient Identification: Haley White MRN:  [id number] Date of Evaluation:  05/07/2015 Chief Complaint:  DEPRESSION Principal Diagnosis: Steroid-induced depression Diagnosis:   Patient Active Problem List   Diagnosis Date Noted  . 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 Depression, major, recurrent, moderate [F33.1] 05/07/2015    Priority: High  . Suicide attempt by drug ingestion [T50.902A]   . Medication management [Z79.899] 08/22/2014  . Vitamin D deficiency [E55.9] 08/22/2014  . Hyperlipidemia [E78.5] 08/22/2014  . Hypertension [I10]   . Anxiety [F41.9]   . 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 Obese [E66.9]    History of Present Illness: Haley White is an 59 y.o. female. Pt presents voluntarily BIB by EMS d/t intentional overdose on medications. Pt sts her son called EMS. Pt is cooperative and soft spoken. She is oriented x 4. Pt's affect is depressed and she reports depressed mood. Pt reports last night she became overwhelmed by stressors. Pt sts she developed gout recently and walks with crutches. She reports gout causes her extreme pain. Pt sts she is in the process of downsizing homes from a 5 bedroom to a 2 bedroom home. She reports her mom and brother are putting "pressure" on her to finish the move so they can put the bigger home on the market. She says she was thinking "I just can't do this anymore" and "I'm tired of all this". She says that she ingested two Tramadol last night to help her sleep. She then says she took the rest of the pills she had to kill herself. Pt says she wrote an email to her daughter and son last night. She says she doesn't remember what she wrote but she did write that she "loved them." Pt also says last night she wrote "on a piece of paper" to her kids that she "was worry I've caused you so much pain." Pt unsure where her written note is. Pt denies previous suicide attempts. She reports she sees Dr Chucky May for MDD and med management. Pt says she ran out of her psych meds a few weeks ago and hasn't taken them.   Pt arrived at [facility] and was oriented to the milieu. Pt reports a good family support system including 2 daughters, a son, and parents as well as siblings nearby. Pt cites primary stressors as downsizing from a 5-bedroom house to a 2-bedroom house due to financial strain, selling all of her furniture, severe gout pain in right foot with new use of strong pain medications, and taking high-dose prednisone for such with a known history of severe anxiety/agitation with prednisone. Pt reports that the tipping point appeared to be the prednisone altering her mood. Pt is calm, cooperative, alert/oriented x4, and answers questions appropriately. Pt denies any history of suicidal ideation or attempts and reports that she was "very overwhelmed" at the time she "took random pills" in front of her son. 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 Pt also does mission work [place] and is smiling appropriately while discussing. Pt now denies suicidal ideation or intent, denies homicidal ideation (never a known issue), and denies psychosis (no hx of such). She does not appear to be responding to internal stimuli.   Her affect is congruent and she cites multiple coping mechanisms to include music, journaling, going to the beach, talking with family, and helping others. Pt is optimistic about her situation  and reports that she needs help with the gout before her appointment on Wed with orthopedic providers. Pt in agreement to restart Prozac (tomorrow), initiate Buspar for persistent stressors that are ongoing, and colcrys for her gout attack. Pt is able to contract for safety on the unit and at home.    Elements:  Location:  Generalized, psychiatric. Quality:  Improving. Severity:  Moderate. Timing:  Transient. Duration:  Acute onset  . Context:  Financial, family, moving to smaller house, prednisone anxiety/panic with history of such. Associated Signs/Symptoms: Depression Symptoms:  depressed mood, insomnia, feelings of worthlessness/guilt, difficulty concentrating, anxiety, loss of energy/fatigue, (Hypo) Manic Symptoms:  Impulsivity, Anxiety Symptoms:  Excessive Worry, Panic Symptoms, Psychotic Symptoms:  N/A PTSD Symptoms: NA Total Time spent with patient: 45 minutes  Past Medical History:  Past Medical History  Diagnosis Date  . Depression   . Asthma   . Cataract   . Hypertension   . Anxiety   . Obese   . 250.00     not on med for diabetes  . Hyperlipidemia   . Arthritis   . Gout   . Suicide attempt     Past Surgical History  Procedure Laterality Date  . Knee surgery Left 1998  . Foot surgery Left 2002  . Appendectomy  1984  . Cataract extraction w/ intraocular lens  implant, bilateral  2014  . Cesarean section  1984, 1987, 1994    X3  . Tubal ligation  1994  . Rotator cuff repair Right 2007   Family History:  Family History  Problem Relation Age of Onset  . Dementia Father   . Anxiety disorder Sister   . Colon cancer Neg Hx    Social History:  History  Alcohol Use  . 3.6 oz/week  . 6 Glasses of wine per week    Comment: occasional     History  Drug Use No    History   Social History  . Marital Status: Divorced    Spouse Name: N/A  . Number of Children: N/A  . Years of Education: N/A   Social History Main Topics  . Smoking status: Former Smoker    Quit date: 11/04/1977  . Smokeless tobacco: Never Used  . Alcohol Use: 3.6 oz/week    6 Glasses of wine per week     Comment: occasional  . Drug Use: No  . Sexual Activity: Yes    Birth Control/ Protection: Surgical   Other Topics Concern  . None   Social History Narrative   Additional Social History:                          Musculoskeletal: Strength & Muscle Tone: within normal limits Gait &  Station: normal Patient leans: N/A  Psychiatric Specialty Exam: Physical Exam  Review of Systems  Musculoskeletal: Positive for joint pain (right foot gout flare up).  Psychiatric/Behavioral: Positive for depression. Negative for suicidal ideas, hallucinations and substance abuse. The patient is nervous/anxious. The patient does not have insomnia.   All other systems reviewed and are negative.   Blood pressure 137/54, pulse 76, temperature 98.4 F (36.9 C), temperature source Oral, resp. rate 16, height 5' 4"  (1.626 m), weight 84.823 kg (187 lb), last menstrual period 11/26/2011.Body mass index is 32.08 kg/(m^2).  General Appearance: Casual and Fairly Groomed  Eye Contact::  Good  Speech:  Clear and Coherent and Normal Rate  Volume:  Normal  Mood:  Anxious and Depressed  Affect:  Appropriate and Congruent  Thought Process:  Coherent and Goal Directed  Orientation:  Full (Time, Place, and Person)  Thought Content:  WDL  Suicidal Thoughts:  Yes.  with intent/plan although minimizing   Homicidal Thoughts:  No  Memory:  Immediate;   Fair Recent;   Fair Remote;   Fair  Judgement:  Fair  Insight:  Good  Psychomotor Activity:  Normal  Concentration:  Good  Recall:  Good  Fund of Knowledge:Good  Language: Fair  Akathisia:  No  Handed:    AIMS (if indicated):     Assets:  Communication Skills Desire for Improvement Resilience Social Support  ADL's:  Intact  Cognition: WNL  Sleep:  Number of Hours: 5   Risk to Self: Is patient at risk for suicide?: Yes Risk to Others:   Prior Inpatient Therapy:   Prior Outpatient Therapy:    Alcohol Screening: 1. How often do you have a drink containing alcohol?: 2 to 4 times a month 2. How many drinks containing alcohol do you have on a typical day when you are drinking?: 1 or 2 3. How often do you have six or more drinks on one occasion?: Never Preliminary Score: 0 9. Have you or someone else been injured as a result of your drinking?:  No 10. Has a relative or friend or a doctor or another health worker been concerned about your drinking or suggested you cut down?: No Alcohol Use Disorder Identification Test Final Score (AUDIT): 2 Brief Intervention: AUDIT score less than 7 or less-screening does not suggest unhealthy drinking-brief intervention not indicated  Allergies:   Allergies  Allergen Reactions  . Biaxin [Clarithromycin]     GI upset  . Serevent [Salmeterol]     Palpitations  . Tequin [Gatifloxacin]     GI upset. thrush   Lab Results:  Results for orders placed or performed during the hospital encounter of 05/05/15 (from the past 48 hour(s))  CBC     Status: Abnormal   Collection Time: 05/06/15 12:19 AM  Result Value Ref Range   WBC 13.3 (H) 4.0 - 10.5 K/uL   RBC 4.06 3.87 - 5.11 MIL/uL   Hemoglobin 12.9 12.0 - 15.0 g/dL   HCT 37.5 36.0 - 46.0 %   MCV 92.4 78.0 - 100.0 fL   MCH 31.8 26.0 - 34.0 pg   MCHC 34.4 30.0 - 36.0 g/dL   RDW 12.8 11.5 - 15.5 %   Platelets 433 (H) 150 - 400 K/uL  Comprehensive metabolic panel     Status: None   Collection Time: 05/06/15 12:19 AM  Result Value Ref Range   Sodium 141 135 - 145 mmol/L   Potassium 3.6 3.5 - 5.1 mmol/L   Chloride 105 101 - 111 mmol/L   CO2 27 22 - 32 mmol/L   Glucose, Bld 80 65 - 99 mg/dL   BUN 18 6 - 20 mg/dL   Creatinine, Ser 0.99 0.44 - 1.00 mg/dL   Calcium 9.3 8.9 - 10.3 mg/dL   Total Protein 7.1 6.5 - 8.1 g/dL   Albumin 3.8 3.5 - 5.0 g/dL   AST 18 15 - 41 U/L   ALT 22 14 - 54 U/L   Alkaline Phosphatase 68 38 - 126 U/L   Total Bilirubin 0.3 0.3 - 1.2 mg/dL   GFR calc non Af Amer >60 >60 mL/min   GFR calc Af Amer >60 >60 mL/min    Comment: (NOTE) The eGFR has been calculated using the CKD EPI  equation. This calculation has not been validated in all clinical situations. eGFR's persistently <60 mL/min signify possible Chronic Kidney Disease.    Anion gap 9 5 - 15  Ethanol (ETOH)     Status: None   Collection Time: 05/06/15 12:19 AM   Result Value Ref Range   Alcohol, Ethyl (B) <5 <5 mg/dL    Comment:        LOWEST DETECTABLE LIMIT FOR SERUM ALCOHOL IS 5 mg/dL FOR MEDICAL PURPOSES ONLY   Acetaminophen level     Status: Abnormal   Collection Time: 05/06/15 12:19 AM  Result Value Ref Range   Acetaminophen (Tylenol), Serum <10 (L) 10 - 30 ug/mL    Comment:        THERAPEUTIC CONCENTRATIONS VARY SIGNIFICANTLY. A RANGE OF 10-30 ug/mL MAY BE AN EFFECTIVE CONCENTRATION FOR MANY PATIENTS. HOWEVER, SOME ARE BEST TREATED AT CONCENTRATIONS OUTSIDE THIS RANGE. ACETAMINOPHEN CONCENTRATIONS >150 ug/mL AT 4 HOURS AFTER INGESTION AND >50 ug/mL AT 12 HOURS AFTER INGESTION ARE OFTEN ASSOCIATED WITH TOXIC REACTIONS.   Salicylate level     Status: None   Collection Time: 05/06/15 12:19 AM  Result Value Ref Range   Salicylate Lvl <2.3 2.8 - 30.0 mg/dL  Urine rapid drug screen (hosp performed)not at [facility]     Status: Abnormal   Collection Time: 05/06/15 12:22 AM  Result Value Ref Range   Opiates NONE DETECTED NONE DETECTED   Cocaine NONE DETECTED NONE DETECTED   Benzodiazepines POSITIVE (A) NONE DETECTED   Amphetamines NONE DETECTED NONE DETECTED   Tetrahydrocannabinol NONE DETECTED NONE DETECTED   Barbiturates NONE DETECTED NONE DETECTED    Comment:        DRUG SCREEN FOR MEDICAL PURPOSES ONLY.  IF CONFIRMATION IS NEEDED FOR ANY PURPOSE, NOTIFY LAB WITHIN 5 DAYS.        LOWEST DETECTABLE LIMITS FOR URINE DRUG SCREEN Drug Class       Cutoff (ng/mL) Amphetamine      1000 Barbiturate      200 Benzodiazepine   536 Tricyclics       144 Opiates          300 Cocaine          300 THC              50    Current Medications: Current Facility-Administered Medications  Medication Dose Route Frequency Provider Last Rate Last Dose  . acetaminophen (TYLENOL) tablet 650 mg  650 mg Oral Q6H PRN Dara Hoyer, PA-C      . alum & mag hydroxide-simeth (MAALOX/MYLANTA) 200-200-20 MG/5ML suspension 30 mL  30 mL Oral Q4H PRN  Dara Hoyer, PA-C      . ibuprofen (ADVIL,MOTRIN) tablet 600 mg  600 mg Oral Q6H PRN Dara Hoyer, PA-C      . magnesium hydroxide (MILK OF MAGNESIA) suspension 30 mL  30 mL Oral Daily PRN Dara Hoyer, PA-C      . traZODone (DESYREL) tablet 50 mg  50 mg Oral QHS PRN,MR X 1 Dara Hoyer, PA-C   50 mg at 05/07/15 3154   PTA Medications: Facility-administered medications prior to admission  Medication Dose Route Frequency Provider Last Rate Last Dose  . albuterol (PROVENTIL) (2.5 MG/3ML) 0.083% nebulizer solution 2.5 mg  2.5 mg Nebulization Once Heather M Marte, PA-C      . ipratropium (ATROVENT) nebulizer solution 0.5 mg  0.5 mg Nebulization Once Collene Leyden, PA-C       Prescriptions prior to  admission  Medication Sig Dispense Refill Last Dose  . albuterol (PROVENTIL HFA;VENTOLIN HFA) 108 (90 BASE) MCG/ACT inhaler Inhale 2 puffs into the lungs every 6 (six) hours as needed. wheezing 8.5 g 2 Past Week at Unknown time  . Ascorbic Acid (VITAMIN C PO) Take 1 tablet by mouth daily.   Past Week at Unknown time  . Calcium Carbonate-Vitamin D (CALCIUM + D PO) Take 1 tablet by mouth daily.   Past Week at Unknown time  . diclofenac (VOLTAREN) 50 MG EC tablet Take 1 tablet (50 mg total) by mouth 3 (three) times daily. 30 tablet 0 Past Week at Unknown time  . FLUoxetine (PROZAC) 20 MG tablet Take 20 mg by mouth daily.   05/05/2015 at Unknown time  . ipratropium-albuterol (DUONEB) 0.5-2.5 (3) MG/3ML SOLN Take 3 mLs by nebulization every 4 (four) hours as needed. (Patient taking differently: Take 3 mLs by nebulization every 4 (four) hours as needed. For shortness of breath) 360 mL 3 Taking  . lidocaine (XYLOCAINE) 2 % solution Use as directed 20 mLs in the mouth or throat every 6 (six) hours as needed for mouth pain. 100 mL 0 Past Month at Unknown time  . pravastatin (PRAVACHOL) 40 MG tablet Take 1 tablet (40 mg total) by mouth every evening. 30 tablet 11 05/04/2015 at unknown    Previous  Psychotropic Medications: Yes   Substance Abuse History in the last 12 months:  No.   Consequences of Substance Abuse: NA  Results for orders placed or performed during the hospital encounter of 05/05/15 (from the past 72 hour(s))  CBC     Status: Abnormal   Collection Time: 05/06/15 12:19 AM  Result Value Ref Range   WBC 13.3 (H) 4.0 - 10.5 K/uL   RBC 4.06 3.87 - 5.11 MIL/uL   Hemoglobin 12.9 12.0 - 15.0 g/dL   HCT 37.5 36.0 - 46.0 %   MCV 92.4 78.0 - 100.0 fL   MCH 31.8 26.0 - 34.0 pg   MCHC 34.4 30.0 - 36.0 g/dL   RDW 12.8 11.5 - 15.5 %   Platelets 433 (H) 150 - 400 K/uL  Comprehensive metabolic panel     Status: None   Collection Time: 05/06/15 12:19 AM  Result Value Ref Range   Sodium 141 135 - 145 mmol/L   Potassium 3.6 3.5 - 5.1 mmol/L   Chloride 105 101 - 111 mmol/L   CO2 27 22 - 32 mmol/L   Glucose, Bld 80 65 - 99 mg/dL   BUN 18 6 - 20 mg/dL   Creatinine, Ser 0.99 0.44 - 1.00 mg/dL   Calcium 9.3 8.9 - 10.3 mg/dL   Total Protein 7.1 6.5 - 8.1 g/dL   Albumin 3.8 3.5 - 5.0 g/dL   AST 18 15 - 41 U/L   ALT 22 14 - 54 U/L   Alkaline Phosphatase 68 38 - 126 U/L   Total Bilirubin 0.3 0.3 - 1.2 mg/dL   GFR calc non Af Amer >60 >60 mL/min   GFR calc Af Amer >60 >60 mL/min    Comment: (NOTE) The eGFR has been calculated using the CKD EPI equation. This calculation has not been validated in all clinical situations. eGFR's persistently <60 mL/min signify possible Chronic Kidney Disease.    Anion gap 9 5 - 15  Ethanol (ETOH)     Status: None   Collection Time: 05/06/15 12:19 AM  Result Value Ref Range   Alcohol, Ethyl (B) <5 <5 mg/dL    Comment:  LOWEST DETECTABLE LIMIT FOR SERUM ALCOHOL IS 5 mg/dL FOR MEDICAL PURPOSES ONLY   Acetaminophen level     Status: Abnormal   Collection Time: 05/06/15 12:19 AM  Result Value Ref Range   Acetaminophen (Tylenol), Serum <10 (L) 10 - 30 ug/mL    Comment:        THERAPEUTIC CONCENTRATIONS VARY SIGNIFICANTLY. A RANGE OF  10-30 ug/mL MAY BE AN EFFECTIVE CONCENTRATION FOR MANY PATIENTS. HOWEVER, SOME ARE BEST TREATED AT CONCENTRATIONS OUTSIDE THIS RANGE. ACETAMINOPHEN CONCENTRATIONS >150 ug/mL AT 4 HOURS AFTER INGESTION AND >50 ug/mL AT 12 HOURS AFTER INGESTION ARE OFTEN ASSOCIATED WITH TOXIC REACTIONS.   Salicylate level     Status: None   Collection Time: 05/06/15 12:19 AM  Result Value Ref Range   Salicylate Lvl <1.6 2.8 - 30.0 mg/dL  Urine rapid drug screen (hosp performed)not at [facility]     Status: Abnormal   Collection Time: 05/06/15 12:22 AM  Result Value Ref Range   Opiates NONE DETECTED NONE DETECTED   Cocaine NONE DETECTED NONE DETECTED   Benzodiazepines POSITIVE (A) NONE DETECTED   Amphetamines NONE DETECTED NONE DETECTED   Tetrahydrocannabinol NONE DETECTED NONE DETECTED   Barbiturates NONE DETECTED NONE DETECTED    Comment:        DRUG SCREEN FOR MEDICAL PURPOSES ONLY.  IF CONFIRMATION IS NEEDED FOR ANY PURPOSE, NOTIFY LAB WITHIN 5 DAYS.        LOWEST DETECTABLE LIMITS FOR URINE DRUG SCREEN Drug Class       Cutoff (ng/mL) Amphetamine      1000 Barbiturate      200 Benzodiazepine   109 Tricyclics       604 Opiates          300 Cocaine          300 THC              50     Observation Level/Precautions:  15 minute checks  Laboratory:  Labs resulted, reviewed, and stable at this time.   Psychotherapy:  Group therapy, individual therapy, psychoeducation  Medications:  See MAR above  Consultations: None    Discharge Concerns: None    Estimated LOS: 5-7 days  Other:  N/A   Psychological Evaluations: Yes   Treatment Plan Summary: Steroid-induced depression, improving, managed as below:  Medications: Prozac 28m daily for depression Buspar 19mbid for anxiety Vistaril 2536m6h prn breakthrough anxiety Colcrys 1.2mg75mce (for acute flare-up) and 0.6mg 75m PRN x 8 doses until relieved; discontinue if pt has diarrhea (rare)  Daily contact with patient to assess and  evaluate symptoms and progress in treatment and Medication management  Medical Decision Making:  Established Problem, Stable/Improving (1), New problem, with additional work up planned, Review of Psycho-Social Stressors (1), Review or order clinical lab tests (1), Review of Medication Regimen & Side Effects (2) and Review of New Medication or Change in Dosage (2)  I certify that inpatient services furnished can reasonably be expected to improve the patient's condition.    WithrBenjamine [id number] PM I personally assessed the patient, reviewed the physical exam and labs and formulated the treatment plan IrvinGeralyn Flashugo,Sabra Heck.

## 2015-05-07 NOTE — Progress Notes (Signed)
Patient ID: Haley White, female   DOB: [birth year], 59 y.o.   MRN: [id number]  DAR: Pt. Denies SI/HI and A/V Hallucinations to Probation officer. Patient reports pain in foot related to Gout. Patient received one time dose of Tramadol which patient reports helped a little bit. Support and encouragement provided to the patient. Scheduled medications administered to patient per physician's orders. Patient is receptive and cooperative. Patient is seen in the milieu interacting with peers. Q15 minute checks are maintained for safety.

## 2015-05-07 NOTE — Progress Notes (Signed)
59 year old female pt admitted on voluntary basis. Pt reports having on-going issues with her family in regards to trying to downsize her home and reports feeling pressure from her mother and her brother. Pt reports feeling guilty because she is not able to get this accomplished as they want her to. Pt reports having recently dealt with strep throat and bronchitis and currently dealing with a bout of gout that has led to a delay in the process. Pt denies any SI on admission and reports that she has not been feeling about herself lately and able to contract for safety on the unit. Pt was ambulating with crutches on admit and was provided a wheelchair to assist with ambulation. Pt was oriented to the unit and safety maintained.

## 2015-05-07 NOTE — Progress Notes (Signed)
Adult Psychoeducational Group Note  Date:  05/07/2015 Time:  2000  Group Topic/Focus:  Wrap-Up Group:   The focus of this group is to help patients review their daily goal of treatment and discuss progress on daily workbooks.  Participation Level:  Active  Participation Quality:  Appropriate  Affect:  Appropriate  Cognitive:  Appropriate  Insight: Appropriate  Engagement in Group:  Supportive  Modes of Intervention:  Support  Additional Comments:  PATIENT STATED SHE WAS HAVING A GOOD DAY.    Haley White [name] 05/07/2015, 9:59 PM

## 2015-05-07 NOTE — Progress Notes (Signed)
D:Patient in her room visiting with [facility].  Patient spoke with Probation officer and states she had a good day.  Patient states she has been using a wheelchair to get around.  Patient states she feels the only reason this happened was due to Prednisone and other medications.  Patient denies SI/HI and denies AVH. A: Staff to monitor Q 15 mins for safety.  Encouragement and support offered.  Scheduled medications administered per orders.  Trazodone administered prn for sleep. R: Patient remains safe on the unit.  Patient attended group tonight.  Patient visible on the unit and interacting with peers.  Patient taking administered medications.

## 2015-05-07 NOTE — Plan of Care (Signed)
Problem: Ineffective individual coping Goal: STG: Patient will remain free from self harm Outcome: Progressing Patient remains free from self harm as evidenced by Q15 minute safety checks.

## 2015-05-07 NOTE — [facility] Group Notes (Signed)
[facility] Group Notes:  (Nursing/MHT/Case Management/Adjunct)  Date:  05/07/2015  Time:  12:36 PM  Type of Therapy:  Nurse Education  Participation Level:  Did Not Attend  Participation Quality:  did not attend  Affect:  did not attend  Cognitive:  did not attend  Insight:  None  Engagement in Group:  None  Modes of Intervention:  Discussion and Education  Summary of Progress/Problems: Patient was invited to group however did not attend   Gaylan Gerold E 05/07/2015, 12:36 PM

## 2015-05-08 DIAGNOSIS — F332 Major depressive disorder, recurrent severe without psychotic features: Secondary | ICD-10-CM | POA: Insufficient documentation

## 2015-05-08 MED ORDER — BUSPIRONE HCL 10 MG PO TABS: 10.0000 mg | ORAL_TABLET | Freq: Two times a day (BID) | ORAL | Status: DC

## 2015-05-08 MED ORDER — TRAZODONE HCL 50 MG PO TABS: 50.0000 mg | ORAL_TABLET | Freq: Every evening | ORAL | Status: DC | PRN

## 2015-05-08 MED ORDER — DICLOFENAC SODIUM 50 MG PO TBEC: 50.0000 mg | DELAYED_RELEASE_TABLET | Freq: Three times a day (TID) | ORAL | Status: DC

## 2015-05-08 MED ORDER — COLCHICINE 0.6 MG PO TABS: 0.6000 mg | ORAL_TABLET | ORAL | Status: DC | PRN

## 2015-05-08 MED ORDER — IPRATROPIUM BROMIDE 0.02 % IN SOLN: 0.5000 mg | Freq: Once | RESPIRATORY_TRACT | Status: DC

## 2015-05-08 MED ORDER — ALBUTEROL SULFATE HFA 108 (90 BASE) MCG/ACT IN AERS: 2.0000 | INHALATION_SPRAY | Freq: Four times a day (QID) | RESPIRATORY_TRACT | Status: DC | PRN

## 2015-05-08 MED ORDER — FLUOXETINE HCL 20 MG PO CAPS: 20.0000 mg | ORAL_CAPSULE | Freq: Every day | ORAL | Status: DC

## 2015-05-08 MED ORDER — PRAVASTATIN SODIUM 40 MG PO TABS: 40.0000 mg | ORAL_TABLET | Freq: Every evening | ORAL | Status: DC

## 2015-05-08 NOTE — Progress Notes (Signed)
  [facility] Case Management Discharge Plan :  Will you be returning to the same living situation after discharge:  Yes,  Patient plans to return home At discharge, do you have transportation home?: Yes,  Patient reports transport by daughter Do you have the ability to pay for your medications: Yes,  patient will be provided with prescriptions at discharge  Release of information consent forms completed and in the chart;  Patient's signature needed at discharge.  Patient to Follow up at: Follow-up Information    Follow up with Dr. Toy Care.   Why:  Please follow up with your provider regarding scheduled follow up appointment (office closed for July 4th).    Contact information:   [address]  [place], [place] [address] PHONE [phone]                Patient denies SI/HI: Yes,  denies    Safety Planning and Suicide Prevention discussed: Yes,  with patient  Have you used any form of tobacco in the last 30 days? (Cigarettes, Smokeless Tobacco, Cigars, and/or Pipes): No  Has patient been referred to the Quitline?: N/A patient is not a smoker  An Schnabel, Erasmo Downer L 05/08/2015, 10:43 AM

## 2015-05-08 NOTE — BHH Suicide Risk Assessment (Signed)
[facility] Discharge Suicide Risk Assessment   Demographic Factors:  Caucasian  Total Time spent with patient: 30 minutes  Musculoskeletal: Strength & Muscle Tone: within normal limits Gait & Station: walking with a limp Patient leans: Right  Psychiatric Specialty Exam: Physical Exam  Review of Systems  Constitutional: Negative.   HENT: Negative.   Eyes: Negative.   Respiratory: Negative.   Cardiovascular: Negative.   Gastrointestinal: Negative.   Genitourinary: Negative.   Musculoskeletal: Positive for joint pain.  Skin: Negative.   Neurological: Negative.   Endo/Heme/Allergies: Negative.   Psychiatric/Behavioral: Positive for depression.    Blood pressure 97/55, pulse 90, temperature 98.6 F (37 C), temperature source Oral, resp. rate 18, height 5\' 4"  (1.626 m), weight 84.823 kg (187 lb), last menstrual period 11/26/2011.Body mass index is 32.08 kg/(m^2).  General Appearance: Fairly Groomed  Engineer, water::  Fair  Speech:  Clear and [id number]  Volume:  Normal  Mood:  Euthymic  Affect:  Appropriate  Thought Process:  Coherent and Goal Directed  Orientation:  Full (Time, Place, and Person)  Thought Content:  plans as she moves on  Suicidal Thoughts:  No  Homicidal Thoughts:  No  Memory:  Immediate;   Fair Recent;   Fair Remote;   Fair  Judgement:  Fair  Insight:  Present  Psychomotor Activity:  Normal  Concentration:  Fair  Recall:  [employer] [place]  Language: Fair  Akathisia:  No  Handed:  Right  AIMS (if indicated):     Assets:  Desire for Improvement Social Support Vocational/Educational  Sleep:  Number of Hours: 5  Cognition: WNL  ADL's:  Intact   Have you used any form of tobacco in the last 30 days? (Cigarettes, Smokeless Tobacco, Cigars, and/or Pipes): No  Has this patient used any form of tobacco in the last 30 days? (Cigarettes, Smokeless Tobacco, Cigars, and/or Pipes) No  Mental Status Per Nursing Assessment::   On Admission:      Current Mental Status by Physician: In full contact with reality. There are no active SI plans or intent.  She is over the reaction to the Prednisone.  States she has never done anything like that before and that is completely out of character for her. States her children are angry with her as they do not understand. She plans to write them notes explaining why this happened. States that after this episode his brother is going to quit putting so much pressure on her.   Loss Factors: NA  Historical Factors: NA  Risk Reduction Factors:   Sense of responsibility to family, Employed, Living with another person, especially a relative and Positive social support  Continued Clinical Symptoms:  Depression:   Impulsivity  Cognitive Features That Contribute To Risk:  None    Suicide Risk:  Minimal: No identifiable suicidal ideation.  Patients presenting with no risk factors but with morbid ruminations; may be classified as minimal risk based on the severity of the depressive symptoms  Principal Problem: Steroid-induced depression Discharge Diagnoses:  Patient Active Problem List   Diagnosis Date Noted  . 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 Depression, major, recurrent, moderate [F33.1] 05/07/2015  . Suicide attempt by drug ingestion [T50.902A]   . Medication management [Z79.899] 08/22/2014  . Vitamin D deficiency [E55.9] 08/22/2014  . Hyperlipidemia [E78.5] 08/22/2014  . Hypertension [I10]   . Anxiety [F41.9]   . 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 Obese [E66.9]     Follow-up Information    Follow up with Dr. Toy Care.  Why:  Please follow up with your provider regarding scheduled follow up appointment (office closed for July 4th).    Contact information:   [address]  [place], [place] [address] PHONE [phone]                Plan Of Care/Follow-up recommendations:  Activity:  as tolerated Diet:  regular Follow up Dr.Kaur as above  Is patient on multiple  antipsychotic therapies at discharge:  No   Has Patient had three or more failed trials of antipsychotic monotherapy by history:  No  Recommended Plan for Multiple Antipsychotic Therapies: NA    Aanvi Voyles A 05/08/2015, 12:33 PM

## 2015-05-08 NOTE — BHH Suicide Risk Assessment (Signed)
[facility] INPATIENT:  Family/Significant Other Suicide Prevention Education  Suicide Prevention Education:  Patient Refusal for Family/Significant Other Suicide Prevention Education: The patient Haley White has refused to provide written consent for family/significant other to be provided Family/Significant Other Suicide Prevention Education during admission and/or prior to discharge.  Physician notified. SPE reviewed with patient and brochure provided. Patient encouraged to return to hospital if having suicidal thoughts, patient verbalized his/her understanding and has no further questions at this time.   Leon Montoya, Casimiro Needle 05/08/2015, 10:37 AM

## 2015-05-08 NOTE — BHH Counselor (Signed)
No PSA completed due to patient discharging prior to 72 hours.  Tilden Fossa, MSW, [place] Worker [facility] [phone]

## 2015-05-08 NOTE — BHH Group Notes (Signed)
   [facility] LCSW Aftercare Discharge Planning Group Note  05/08/2015  8:45 AM   Participation Quality: Alert, Appropriate and Oriented  Mood/Affect: Appropriate  Depression Rating: 0  Anxiety Rating: 0  Thoughts of Suicide: Pt denies SI/HI  Will you contract for safety? Yes  Current AVH: Pt denies  Plan for Discharge/Comments: Pt attended discharge planning group and actively participated in group. CSW provided pt with today's workbook. Patient reports feeling "good" today and "hoping to discharge today". Patient plans to return home and has upcoming appointments with Dr. Toy Care.  Transportation Means: Pt reports access to transportation  Supports: No supports mentioned at this time  Tilden Fossa, MSW, [place] Social Worker [employer] [phone]

## 2015-05-08 NOTE — Progress Notes (Signed)
Patient ID: Haley White, female   DOB: [birth year], 59 y.o.   MRN: [id number]  Pt was discharged home with her son, pt reported being ready for discharge. Pt reported that she was negative SI/HI, no AH/VH noted. Pt requested that this writer speak to her son regarding the side affects to prednisone. This Probation officer spoke to son no issues or concerns noted. Pt reported being negative SI/HI, no AH/VH noted.

## 2015-05-08 NOTE — Discharge Summary (Signed)
Physician Discharge Summary Note  Patient:  Haley White is an 59 y.o., female MRN:  [id number] DOB:  1955/11/19 Patient phone:  [phone] (home)  Patient address:   [place] [address],  Total Time spent with patient: Greater than 30 minutes  Date of Admission:  05/06/2015  Date of Discharge: 05-07-14  Reason for Admission:  Suicide attempts  Principal Problem: Steroid-induced depression  Discharge Diagnoses: Patient Active Problem List   Diagnosis Date Noted  . Major depressive disorder, recurrent, severe without psychotic features [F33.2]   . 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 Depression, major, recurrent, moderate [F33.1] 05/07/2015  . Suicide attempt by drug ingestion [T50.902A]   . Medication management [Z79.899] 08/22/2014  . Vitamin D deficiency [E55.9] 08/22/2014  . Hyperlipidemia [E78.5] 08/22/2014  . Hypertension [I10]   . Anxiety [F41.9]   . 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 Obese [E66.9]    Musculoskeletal: Strength & Muscle Tone: within normal limits Gait & Station: normal Patient leans: N/A  Psychiatric Specialty Exam: Physical Exam  Respiratory: No stridor.  Psychiatric: Her speech is normal and behavior is normal. Judgment and thought content normal. Her mood appears not anxious. Her affect is not angry, not blunt, not labile and not inappropriate. Cognition and memory are normal. She does not exhibit a depressed mood.    Review of Systems  Constitutional: Negative.   HENT: Negative.   Eyes: Negative.   Respiratory: Negative.   Cardiovascular: Negative.   Gastrointestinal: Negative.   Genitourinary: Negative.   Musculoskeletal: Negative.   Skin: Negative.   Neurological: Negative.   Endo/Heme/Allergies: Negative.   Psychiatric/Behavioral: Positive for depression (Stable). Negative for suicidal ideas, hallucinations, memory loss and substance abuse. The patient has insomnia. The patient is not nervous/anxious (Stable).      Blood pressure 97/55, pulse 90, temperature 98.6 F (37 C), temperature source Oral, resp. rate 18, height 5\' 4"  (1.626 m), weight 84.823 kg (187 lb), last menstrual period 11/26/2011.Body mass index is 32.08 kg/(m^2).  See Md's SRA   Have you used any form of tobacco in the last 30 days? (Cigarettes, Smokeless Tobacco, Cigars, and/or Pipes): No  Has this patient used any form of tobacco in the last 30 days? (Cigarettes, Smokeless Tobacco, Cigars, and/or Pipes) No  Past Medical History:  Past Medical History  Diagnosis Date  . Depression   . Asthma   . Cataract   . Hypertension   . Anxiety   . Obese   . 250.00     not on med for diabetes  . Hyperlipidemia   . Arthritis   . Gout   . Suicide attempt     Past Surgical History  Procedure Laterality Date  . Knee surgery Left 1998  . Foot surgery Left 2002  . Appendectomy  1984  . Cataract extraction w/ intraocular lens  implant, bilateral  2014  . Cesarean section  1984, 1987, 1994    X3  . Tubal ligation  1994  . Rotator cuff repair Right 2007   Family History:  Family History  Problem Relation Age of Onset  . Dementia Father   . Anxiety disorder Sister   . Colon cancer Neg Hx    Social History:  History  Alcohol Use  . 3.6 oz/week  . 6 Glasses of wine per week    Comment: occasional     History  Drug Use No    History   Social History  . Marital Status: Divorced    Spouse Name:  N/A  . Number of Children: N/A  . Years of Education: N/A   Social History Main Topics  . Smoking status: Former Smoker    Quit date: 11/04/1977  . Smokeless tobacco: Never Used  . Alcohol Use: 3.6 oz/week    6 Glasses of wine per week     Comment: occasional  . Drug Use: No  . Sexual Activity: Yes    Birth Control/ Protection: Surgical   Other Topics Concern  . None   Social History Narrative   Risk to Self: Is patient at risk for suicide?: Yes Risk to Others: No Prior Inpatient Therapy: Yes Prior Outpatient  Therapy: Yes  Level of Care:  OP  Hospital Course: [name] is an 59 y.o. female. Pt presents voluntarily BIB by EMS d/t intentional overdose on medications. Pt sts her son called EMS. She is oriented x 4. She reports depressed mood, overwhelmed by stressors. She says, developed gout recently and walks with crutches. She reports extreme pain. She is in the process of downsizing homes from a 5 bedroom to a 2 bedroom home. She says "I just can't do this anymore tired of all this". She says that she ingested two Tramadol last night to help her sleep. She then says she took the rest of the pills she had to kill herself. Castella says she wrote an email to her daughter and son last night. She says she doesn't remember what she wrote about but wrote that she "loved them." She denies previous suicide attempts. She says she sees Dr Chucky May for MDD and med management.  Ms. Hilligoss stay in this hospital was rather very brief. She was admitted for possible suicide attempt by overdose due to familial stressors & feeling overwhelmed. After admission assessment, her symptoms were identified. Medication management targeting those symptoms were initiated. She was enrolled in the group counseling sessions/activities to learn coping skills that should help her after discharge to cope better and manage her stressors much better. She presented other pre-existing medical issues/new concerns that required monitoring and or treatments. She was resumed on her pertinent home medication for those health issues, a new medication for gout was also initiated.    Patient came to the providers this am & asked to be discharged to her home. She says her suicide attempt was impulsively acted upon induced by stress & was never planned or intended. She adds that she is feeling better emotionally and physically, and stable to be discharged to her home.There are no active SI plans or intent.No AVH, delusions or paranoia. She is over the  reaction to the Prednisone that aided in inducing her worsening symptoms of depression.She states that she has never done anything like that before and that is completely out of character for her. States her children are angry with her as they do not understand. She plans to write them notes explaining why this happened. States that after this episode, his brother is going to quit putting so much pressure on her. She will be following up care as noted below. She left [facility] with all personal belongings in no apparent distress. Transportation per family.  Consults:  psychiatry  Significant Diagnostic Studies:  labs: CBC with diff, CMP, UDS, toxicology tests, U/a, results reviewed, stable  Discharge Vitals:   Blood pressure 97/55, pulse 90, temperature 98.6 F (37 C), temperature source Oral, resp. rate 18, height 5\' 4"  (1.626 m), weight 84.823 kg (187 lb), last menstrual period 11/26/2011. Body mass index is 32.08  kg/(m^2). Lab Results:   No results found for this or any previous visit (from the past 72 hour(s)).  Physical Findings: AIMS: Facial and Oral Movements Muscles of Facial Expression: None, normal Lips and Perioral Area: None, normal Jaw: None, normal Tongue: None, normal,Extremity Movements Upper (arms, wrists, hands, fingers): None, normal Lower (legs, knees, ankles, toes): None, normal, Trunk Movements Neck, shoulders, hips: None, normal, Overall Severity Severity of abnormal movements (highest score from questions above): None, normal Incapacitation due to abnormal movements: None, normal Patient's awareness of abnormal movements (rate only patient's report): No Awareness, Dental Status Current problems with teeth and/or dentures?: No Does patient usually wear dentures?: No  CIWA:    COWS:     See Psychiatric Specialty Exam and Suicide Risk Assessment completed by Attending Physician prior to discharge.  Discharge destination:  Home  Is patient on multiple antipsychotic  therapies at discharge:  No   Has Patient had three or more failed trials of antipsychotic monotherapy by history:  No  Recommended Plan for Multiple Antipsychotic Therapies: NA    Medication List    STOP taking these medications        CALCIUM + D PO     FLUoxetine 20 MG tablet  Commonly known as:  PROZAC  Replaced by:  FLUoxetine 20 MG capsule     ipratropium-albuterol 0.5-2.5 (3) MG/3ML Soln  Commonly known as:  DUONEB     lidocaine 2 % solution  Commonly known as:  XYLOCAINE     VITAMIN C PO      TAKE these medications      Indication   albuterol 108 (90 BASE) MCG/ACT inhaler  Commonly known as:  PROVENTIL HFA;VENTOLIN HFA  Inhale 2 puffs into the lungs every 6 (six) hours as needed for shortness of breath. wheezing   Indication:  Asthma     busPIRone 10 MG tablet  Commonly known as:  BUSPAR  Take 1 tablet (10 mg total) by mouth 2 (two) times daily. For anxiety   Indication:  Generalized Anxiety Disorder     colchicine 0.6 MG tablet  Take 1 tablet (0.6 mg total) by mouth every 3 (three) hours as needed (gout pain, continue q3h until pain relieved OR pt has diarrhea).   Indication:  Gout     diclofenac 50 MG EC tablet  Commonly known as:  VOLTAREN  Take 1 tablet (50 mg total) by mouth 3 (three) times daily. For arthritic pain   Indication:  Joint Damage causing Pain and Loss of Function     FLUoxetine 20 MG capsule  Commonly known as:  PROZAC  Take 1 capsule (20 mg total) by mouth daily. For depression   Indication:  Depression     pravastatin 40 MG tablet  Commonly known as:  PRAVACHOL  Take 1 tablet (40 mg total) by mouth every evening. For cholesterol   Indication:  Inherited Heterozygous Hypercholesterolemia     traZODone 50 MG tablet  Commonly known as:  DESYREL  Take 1 tablet (50 mg total) by mouth at bedtime as needed and may repeat dose one time if needed for sleep.   Indication:  Trouble Sleeping       Follow-up Information    Follow up  with Dr. Toy Care.   Why:  Please follow up with your provider regarding scheduled follow up appointment (office closed for July 4th).    Contact information:   [address]  [place], [place] [address] PHONE [phone]  Follow-up recommendations: Activity:  As tolerated Diet: As recommended by your primary care doctor. Keep all scheduled follow-up appointments as recommended.   Comments: Take all your medications as prescribed by your mental healthcare provider. Report any adverse effects and or reactions from your medicines to your outpatient provider promptly. Patient is instructed and cautioned to not engage in alcohol and or illegal drug use while on prescription medicines. In the event of worsening symptoms, patient is instructed to call the crisis hotline, 911 and or go to the nearest ED for appropriate evaluation and treatment of symptoms. Follow-up with your primary care provider for your other medical issues, concerns and or health care needs.  Total Discharge Time: Greater than 30 minutes  Signed: Encarnacion Slates, PMHNP, FNP-BC 05/09/2015, 9:43 AM  I personally assessed the patient and formulated the plan Geralyn Flash A. Sabra Heck, M.D.

## 2015-07-07 ENCOUNTER — Ambulatory Visit: Payer: Self-pay | Admitting: Internal Medicine

## 2015-07-12 ENCOUNTER — Ambulatory Visit: Payer: Self-pay | Admitting: Internal Medicine

## 2015-08-30 ENCOUNTER — Encounter: Payer: Self-pay | Admitting: Internal Medicine

## 2015-09-21 ENCOUNTER — Encounter: Payer: Self-pay | Admitting: Internal Medicine

## 2015-09-21 ENCOUNTER — Ambulatory Visit (INDEPENDENT_AMBULATORY_CARE_PROVIDER_SITE_OTHER): Payer: BLUE CROSS/BLUE SHIELD | Admitting: Internal Medicine

## 2015-09-21 VITALS — BP 132/88 | HR 90 | Temp 98.2°F | Resp 16 | Ht 63.0 in | Wt 185.0 lb

## 2015-09-21 DIAGNOSIS — J069 Acute upper respiratory infection, unspecified: Secondary | ICD-10-CM | POA: Diagnosis not present

## 2015-09-21 MED ORDER — AZITHROMYCIN 250 MG PO TABS: ORAL_TABLET | ORAL | Status: DC

## 2015-09-21 MED ORDER — BENZONATATE 200 MG PO CAPS: 200.0000 mg | ORAL_CAPSULE | Freq: Three times a day (TID) | ORAL | Status: DC | PRN

## 2015-09-21 MED ORDER — ALBUTEROL SULFATE HFA 108 (90 BASE) MCG/ACT IN AERS: 2.0000 | INHALATION_SPRAY | Freq: Four times a day (QID) | RESPIRATORY_TRACT | Status: DC | PRN

## 2015-09-21 MED ORDER — PROMETHAZINE-DM 6.25-15 MG/5ML PO SYRP: ORAL_SOLUTION | ORAL | Status: DC

## 2015-09-21 MED ORDER — DEXAMETHASONE SODIUM PHOSPHATE 100 MG/10ML IJ SOLN
10.0000 mg | Freq: Once | INTRAMUSCULAR | Status: AC
  Administered 2015-09-21: 10 mg via INTRAMUSCULAR

## 2015-09-21 NOTE — Addendum Note (Signed)
Addended by: Starlyn Skeans A on: 09/21/2015 09:18 AM   Modules accepted: Miquel Dunn

## 2015-09-21 NOTE — Progress Notes (Signed)
Patient ID: Haley White, female   DOB: 13-Dec-1955, 59 y.o.   MRN: [id number]  HPI  Patient presents to the office for evaluation of cough.  It has been going on for 3 weeks.  Patient reports all the time, wet, cough with yellow, green, or blood tinged sputum.  They also endorse change in voice, chills, fever, postnasal drip and runny nose, clear rhinorrhea with occasional blood tinge, sore throat, ear pain.  They have tried mucinex, tylenol, and steam.  They report that nothing has worked.  They admits to other sick contacts.  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   Review of Systems  Constitutional: Positive for fever, chills and malaise/fatigue.  HENT: Positive for congestion, ear pain and sore throat.   Respiratory: Positive for cough and sputum production. Negative for shortness of breath and wheezing.   Cardiovascular: Negative for chest pain, palpitations and leg swelling.  Neurological: Negative for headaches.    PE:  General:  Alert and non-toxic, WDWN, NAD HEENT: NCAT, PERLA, EOM normal, no occular discharge or erythema.  Nasal mucosal edema with sinus tenderness to palpation.  Oropharynx clear with minimal oropharyngeal edema and erythema.  Mucous membranes moist and pink. Neck:  Cervical adenopathy Chest:  RRR no MRGs.  Lungs clear to auscultation A&P with no wheezes rhonchi or rales.   Abdomen: +BS x 4 quadrants, soft, non-tender, no guarding, rigidity, or rebound. Skin: warm and dry no rash Neuro: A&Ox4, CN II-XII grossly intact  Assessment and Plan:   1. Acute URI -flonase -zyrtec -nasal saline -decadron - albuterol (PROVENTIL HFA;VENTOLIN HFA) 108 (90 BASE) MCG/ACT inhaler; Inhale 2 puffs into the lungs every 6 (six) hours as needed for shortness of breath. wheezing  Dispense: 8.5 g; Refill: 2 - azithromycin (ZITHROMAX Z-PAK) 250 MG tablet; 2 po day one, then 1 daily x 4 days  Dispense: 5 tablet;  Refill: 0 - promethazine-dextromethorphan (PROMETHAZINE-DM) 6.25-15 MG/5ML syrup; Take 5 mL as needed at bedtime for severe cough  Dispense: 360 mL; Refill: 1 - benzonatate (TESSALON) 200 MG capsule; Take 1 capsule (200 mg total) by mouth 3 (three) times daily as needed for cough.  Dispense: 30 capsule; Refill: 1

## 2015-09-21 NOTE — Addendum Note (Signed)
Addended by: Kalayna Noy A on: 09/21/2015 09:24 AM   Modules accepted: Orders

## 2015-09-21 NOTE — Patient Instructions (Signed)
Please use claritin, zyrtec, or allegra daily.  Store brand is okay.  Get whatever is on sale.  Please use 2 sprays of flonase, nasacort, or rhinocort right before bedtime.  Please use tessalon for coughing during the day.  Please use phenergan codeine for nighttime coughing. Don't drive after taking it.    If after 2-3 days please start taking your zpak.

## 2015-10-10 ENCOUNTER — Encounter: Payer: Self-pay | Admitting: Internal Medicine

## 2016-04-18 DIAGNOSIS — F3342 Major depressive disorder, recurrent, in full remission: Secondary | ICD-10-CM | POA: Diagnosis not present

## 2016-04-18 DIAGNOSIS — F900 Attention-deficit hyperactivity disorder, predominantly inattentive type: Secondary | ICD-10-CM | POA: Diagnosis not present

## 2016-05-21 ENCOUNTER — Encounter: Payer: Self-pay | Admitting: Internal Medicine

## 2016-05-21 ENCOUNTER — Ambulatory Visit (INDEPENDENT_AMBULATORY_CARE_PROVIDER_SITE_OTHER): Payer: BLUE CROSS/BLUE SHIELD | Admitting: Internal Medicine

## 2016-05-21 VITALS — BP 170/94 | HR 76 | Temp 98.0°F | Resp 18 | Ht 63.0 in | Wt 196.0 lb

## 2016-05-21 DIAGNOSIS — J069 Acute upper respiratory infection, unspecified: Secondary | ICD-10-CM | POA: Diagnosis not present

## 2016-05-21 MED ORDER — AZELASTINE HCL 0.1 % NA SOLN: 2.0000 | Freq: Two times a day (BID) | NASAL | Status: DC

## 2016-05-21 MED ORDER — AZITHROMYCIN 250 MG PO TABS: ORAL_TABLET | ORAL | Status: DC

## 2016-05-21 NOTE — Patient Instructions (Signed)
Please use the cough syrup up to 3 times a day as needed for coughing.  Please take 20 mg of pepcid twice daily or 150 mg twice daily as needed.  Please tylenol and ibuprofen as needed for headache and fever.    Please take the zpak until it is gone.    Please use allegra daily.  Please use nasacort nasal spray 2 sprays per nostril right before bedtime.  Please use astelin 2 sprays per nostril twice daily.    Please drink plenty of water.

## 2016-05-21 NOTE — Progress Notes (Signed)
HPI  Patient presents to the office for evaluation of cough.  It has been going on for 1 weeks.  Patient reports wet, barky, yellow green sputum production.  They also endorse change in voice, chills, fever, postnasal drip, shortness of breath, sputum production, wheezing and yellow green nasal congestion,  sinus pressure, sore throat, ear pressure.  .  They have tried mucinex and ibuprofen.  They report that nothing has worked.  They admits to other sick contacts.  Several of her family members also had sore throats.   Review of Systems  Constitutional: Positive for fever, chills and malaise/fatigue.  HENT: Positive for congestion, ear pain and sore throat.   Respiratory: Positive for cough, sputum production and shortness of breath. Negative for wheezing.   Cardiovascular: Negative for chest pain, palpitations and leg swelling.  Neurological: Positive for headaches.    PE: Filed Vitals:   05/21/16 1433  BP: 170/94  Pulse: 76  Temp: 98 F (36.7 C)  Resp: 18   General:  Alert and non-toxic, WDWN, NAD HEENT: NCAT, PERLA, EOM normal, no occular discharge or erythema.  Nasal mucosal edema with sinus tenderness to palpation.  Oropharynx clear with minimal oropharyngeal edema and erythema.  Mucous membranes moist and pink. Neck:  Cervical adenopathy Chest:  RRR no MRGs.  Lungs clear to auscultation A&P with no wheezes rhonchi or rales.   Abdomen: +BS x 4 quadrants, soft, non-tender, no guarding, rigidity, or rebound. Skin: warm and dry no rash Neuro: A&Ox4, CN II-XII grossly intact  Assessment and Plan:   1. Acute URI -zpak -astelin -nasacort -allegra -phenergan dm at home -drink fluids -pepcid or zantac BID -not a candidate for steroids

## 2016-06-12 DIAGNOSIS — F4010 Social phobia, unspecified: Secondary | ICD-10-CM | POA: Diagnosis not present

## 2016-06-12 DIAGNOSIS — F411 Generalized anxiety disorder: Secondary | ICD-10-CM | POA: Diagnosis not present

## 2016-09-03 ENCOUNTER — Encounter: Payer: Self-pay | Admitting: Internal Medicine

## 2016-10-10 DIAGNOSIS — F3342 Major depressive disorder, recurrent, in full remission: Secondary | ICD-10-CM | POA: Diagnosis not present

## 2016-10-10 DIAGNOSIS — F900 Attention-deficit hyperactivity disorder, predominantly inattentive type: Secondary | ICD-10-CM | POA: Diagnosis not present

## 2016-11-22 DIAGNOSIS — M4856XA Collapsed vertebra, not elsewhere classified, lumbar region, initial encounter for fracture: Secondary | ICD-10-CM | POA: Diagnosis not present

## 2016-11-22 DIAGNOSIS — S40012A Contusion of left shoulder, initial encounter: Secondary | ICD-10-CM | POA: Diagnosis not present

## 2016-11-22 DIAGNOSIS — M4854XA Collapsed vertebra, not elsewhere classified, thoracic region, initial encounter for fracture: Secondary | ICD-10-CM | POA: Diagnosis not present

## 2016-11-22 DIAGNOSIS — S20221A Contusion of right back wall of thorax, initial encounter: Secondary | ICD-10-CM | POA: Diagnosis not present

## 2016-11-22 DIAGNOSIS — S5012XA Contusion of left forearm, initial encounter: Secondary | ICD-10-CM | POA: Diagnosis not present

## 2016-12-02 DIAGNOSIS — M25551 Pain in right hip: Secondary | ICD-10-CM | POA: Diagnosis not present

## 2016-12-02 DIAGNOSIS — S22080A Wedge compression fracture of T11-T12 vertebra, initial encounter for closed fracture: Secondary | ICD-10-CM | POA: Diagnosis not present

## 2016-12-02 DIAGNOSIS — S32020A Wedge compression fracture of second lumbar vertebra, initial encounter for closed fracture: Secondary | ICD-10-CM | POA: Diagnosis not present

## 2016-12-09 DIAGNOSIS — S22080A Wedge compression fracture of T11-T12 vertebra, initial encounter for closed fracture: Secondary | ICD-10-CM | POA: Diagnosis not present

## 2016-12-16 DIAGNOSIS — M5126 Other intervertebral disc displacement, lumbar region: Secondary | ICD-10-CM | POA: Diagnosis not present

## 2016-12-16 DIAGNOSIS — M5137 Other intervertebral disc degeneration, lumbosacral region: Secondary | ICD-10-CM | POA: Diagnosis not present

## 2016-12-16 DIAGNOSIS — S32020D Wedge compression fracture of second lumbar vertebra, subsequent encounter for fracture with routine healing: Secondary | ICD-10-CM | POA: Diagnosis not present

## 2016-12-31 ENCOUNTER — Encounter: Payer: Self-pay | Admitting: Internal Medicine

## 2016-12-31 ENCOUNTER — Ambulatory Visit (INDEPENDENT_AMBULATORY_CARE_PROVIDER_SITE_OTHER): Payer: BLUE CROSS/BLUE SHIELD | Admitting: Internal Medicine

## 2016-12-31 VITALS — BP 142/100 | HR 80 | Temp 97.8°F | Resp 16 | Ht 63.0 in | Wt 208.8 lb

## 2016-12-31 DIAGNOSIS — R51 Headache: Secondary | ICD-10-CM | POA: Diagnosis not present

## 2016-12-31 DIAGNOSIS — T7029XS Other effects of high altitude, sequela: Principal | ICD-10-CM

## 2016-12-31 DIAGNOSIS — R519 Headache, unspecified: Secondary | ICD-10-CM

## 2016-12-31 DIAGNOSIS — Z79899 Other long term (current) drug therapy: Secondary | ICD-10-CM | POA: Diagnosis not present

## 2016-12-31 DIAGNOSIS — R110 Nausea: Secondary | ICD-10-CM

## 2016-12-31 DIAGNOSIS — T7020XS Unspecified effects of high altitude, sequela: Secondary | ICD-10-CM

## 2016-12-31 LAB — CBC WITH DIFFERENTIAL/PLATELET
Basophils Absolute: 0 {cells}/uL (ref 0–200)
Basophils Relative: 0 %
Eosinophils Absolute: 366 {cells}/uL (ref 15–500)
Eosinophils Relative: 6 %
HCT: 38 % (ref 35.0–45.0)
Hemoglobin: 12.9 g/dL (ref 11.7–15.5)
Lymphocytes Relative: 42 %
Lymphs Abs: 2562 {cells}/uL (ref 850–3900)
MCH: 32.1 pg (ref 27.0–33.0)
MCHC: 33.9 g/dL (ref 32.0–36.0)
MCV: 94.5 fL (ref 80.0–100.0)
MPV: 9.8 fL (ref 7.5–12.5)
Monocytes Absolute: 549 {cells}/uL (ref 200–950)
Monocytes Relative: 9 %
Neutro Abs: 2623 {cells}/uL (ref 1500–7800)
Neutrophils Relative %: 43 %
Platelets: 309 K/uL (ref 140–400)
RBC: 4.02 MIL/uL (ref 3.80–5.10)
RDW: 13 % (ref 11.0–15.0)
WBC: 6.1 K/uL (ref 3.8–10.8)

## 2016-12-31 LAB — BASIC METABOLIC PANEL WITH GFR
BUN: 11 mg/dL (ref 7–25)
CO2: 21 mmol/L (ref 20–31)
CREATININE: 0.81 mg/dL (ref 0.50–1.05)
Calcium: 8.9 mg/dL (ref 8.6–10.4)
Chloride: 111 mmol/L — ABNORMAL HIGH (ref 98–110)
GFR, Est African American: >89 mL/min (ref >=60)
GFR, Est Non African American: 80 mL/min (ref >=60)
GLUCOSE: 93 mg/dL (ref 65–99)
Potassium: 4.3 mmol/L (ref 3.5–5.3)
Sodium: 141 mmol/L (ref 135–146)

## 2016-12-31 MED ORDER — ONDANSETRON HCL 8 MG PO TABS: ORAL_TABLET | ORAL | 1 refills | Status: DC

## 2016-12-31 MED ORDER — TRAMADOL HCL 50 MG PO TABS: ORAL_TABLET | ORAL | 0 refills | Status: AC

## 2016-12-31 NOTE — Progress Notes (Signed)
  Subjective:    Patient ID: Haley White, female    DOB: 06-Jul-1956, 61 y.o.   MRN: [id number]  HPI  Patient is a very nice 61 yo [name] with hx/o HTN and diet controlled T2_DM who had been on a church missions trip to [place] (12,000+ feet above sea level) since 12/26/2016 and arrived back in [place] last nite and drove back to [place]. Today she is brought in by her child. And she has c/o beginning yesterday before leaving [place] including global HA, nausea, dizziness and a feeling of confusion or disorientation. She denies any respiratory sx's and is on no new meds.   Medication Sig  . FLUoxetine  20 MG capsule Take 1 cap daily for depression  . methylphenidate 54 MG CR  Take 54 mg  every morning.  . ASTELIN nasal spray 2 sprays into nostrils 2 x daily  . meloxicam 15 MG Take 15 mg by mouth daily.   Allergies  Allergen Reactions  . Biaxin [Clarithromycin]     GI upset  . Serevent [Salmeterol]     Palpitations  . Tequin [Gatifloxacin]     GI upset. thrush   Review of Systems  10 point systems review negative except as above.    Objective:   Physical Exam  BP (!) 142/100   Pulse 80   Temp 97.8 F (36.6 C)   Resp 16   Ht 5\' 3"  (1.6 m)   Wt 208 lb 12.8 oz (94.7 kg)   LMP 11/26/2011   BMI 36.99 kg/m   Alert and O x 3. In no Distress.  HEENT - Eac's patent. TM's Nl. EOM's full. PERRLA. NasoOroPharynx clear. Neck - supple. Nl Thyroid. Carotids 2+ & No bruits, nodes, JVD Chest - Clear equal BS w/o Rales, rhonchi, wheezes. Cor - Nl HS. RRR w/o sig MGR. PP 1(+). No edema. MS- FROM w/o deformities. Muscle power, tone and bulk Nl. Gait Nl. Neuro - No obvious Cr N abnormalities. Sensory, motor and Cerebellar functions appear Nl w/o focal abnormalities. Psyche - Mental status normal & appropriate    Assessment & Plan:   1. High altitude sickness, sequela  - reassure sx's should likely resolve over the next 24-48 hrs, but to return or call of sx's worsen or change.  -   Advised liquid diet and advance slowly as tolerated.   2. Nausea without vomiting  - ondansetron (ZOFRAN) 8 MG tablet; Take 1 tablet 3 x/day if needed for nausea  Dispense: 30 tablet; Refill: 1  3. Acute nonintractable headache, unspecified headache type  - traMADol (ULTRAM) 50 MG tablet; Take 1 tablet every 4 hours if needed for severe Headache  Dispense: 20 tablet; Refill: 0  4. Medication management  - CBC with Differential/Platelet - BASIC METABOLIC PANEL WITH GFR

## 2016-12-31 NOTE — Patient Instructions (Signed)
Altitude Sickness Altitude sickness occurs when a person goes to a high altitude without first letting the body adjust (acclimate) to the higher altitude.Depending on the severity, altitude sickness can be a medical emergency. It can develop into a life-threatening condition. What are the causes? This condition is caused by rapidly going to an altitude of at least 8,200 ft. (2,460 m) above sea level. At this altitude, the air pressure and oxygen levels are lower. What increases the risk? This condition can happen to anyone, regardless of physical condition. However, it is more likely to develop in people who go to a high altitude quickly and are physically active at that altitude. What are the signs or symptoms? Symptoms of this condition usually develop within 72 hours of arriving at the high altitude. Symptoms include:  A severe headache.  Nausea and vomiting.  Shortness of breath.  Dizziness.  Confusion.  Uncoordinated movements.  Fatigue.  Trouble sleeping.  Weakness.   How is this diagnosed? This condition may be diagnosed with a medical history and a physical exam. Sometimes a chest X-ray is taken. How is this treated? In most cases, treatment is not needed. Symptoms gradually go away on their own in 3-5 days. If you do need treatment, you will be moved to a lower altitude of 1,800 ft. (540 m) above sea level or lower as quickly and safely as possible.   Follow these instructions at home:  If you must exercise, do so lightly for the first 24-36 hours after treatment.  Drink enough fluids to keep your urine clear or pale yellow.  Eat small, light meals.  Avoid:  Any tobacco products, such as cigarettes, chewing tobacco, or e-cigarettes.  Taking calming medicines (sedatives).  Alcohol.  Stay at a low altitude.  Have someone stay with you until you feel stable.  Keep all follow-up visits as told by your health care provider. This is important.   How is this  prevented?  Go to higher altitudes slowly, giving your body time to acclimate.  Go to higher altitudes during the daytime and return to lower altitudes at night.  Give your body a few days to adjust to a change in altitude before starting strenuous physical activities.  Ask your health care provider about medicines you can take to prevent altitude sickness. Get help right away if:  You have:  Chest pain or tightness.  A fast heartbeat.  A severe headache.  A severe cough.  Difficulty walking.  Difficulty concentrating.  Severe shortness of breath at rest or with exertion.  You feel confused. Document Released: 10/18/2000 Document Revised: 02/28/2016 Document Reviewed: 07/15/2015 Elsevier Interactive Patient Education  2017 [employer].

## 2017-01-14 DIAGNOSIS — M5126 Other intervertebral disc displacement, lumbar region: Secondary | ICD-10-CM | POA: Diagnosis not present

## 2017-01-14 DIAGNOSIS — M5137 Other intervertebral disc degeneration, lumbosacral region: Secondary | ICD-10-CM | POA: Diagnosis not present

## 2017-01-21 DIAGNOSIS — M5136 Other intervertebral disc degeneration, lumbar region: Secondary | ICD-10-CM | POA: Diagnosis not present

## 2017-01-30 DIAGNOSIS — N281 Cyst of kidney, acquired: Secondary | ICD-10-CM | POA: Diagnosis not present

## 2017-02-04 DIAGNOSIS — M5136 Other intervertebral disc degeneration, lumbar region: Secondary | ICD-10-CM | POA: Diagnosis not present

## 2017-02-06 DIAGNOSIS — M5136 Other intervertebral disc degeneration, lumbar region: Secondary | ICD-10-CM | POA: Diagnosis not present

## 2017-02-11 DIAGNOSIS — M5136 Other intervertebral disc degeneration, lumbar region: Secondary | ICD-10-CM | POA: Diagnosis not present

## 2017-02-13 DIAGNOSIS — M5136 Other intervertebral disc degeneration, lumbar region: Secondary | ICD-10-CM | POA: Diagnosis not present

## 2017-02-18 DIAGNOSIS — M5136 Other intervertebral disc degeneration, lumbar region: Secondary | ICD-10-CM | POA: Diagnosis not present

## 2017-02-20 DIAGNOSIS — M5136 Other intervertebral disc degeneration, lumbar region: Secondary | ICD-10-CM | POA: Diagnosis not present

## 2017-02-25 DIAGNOSIS — M5136 Other intervertebral disc degeneration, lumbar region: Secondary | ICD-10-CM | POA: Diagnosis not present

## 2017-02-27 DIAGNOSIS — M5136 Other intervertebral disc degeneration, lumbar region: Secondary | ICD-10-CM | POA: Diagnosis not present

## 2017-04-03 DIAGNOSIS — F900 Attention-deficit hyperactivity disorder, predominantly inattentive type: Secondary | ICD-10-CM | POA: Diagnosis not present

## 2017-04-03 DIAGNOSIS — F3342 Major depressive disorder, recurrent, in full remission: Secondary | ICD-10-CM | POA: Diagnosis not present

## 2017-04-23 DIAGNOSIS — N281 Cyst of kidney, acquired: Secondary | ICD-10-CM | POA: Diagnosis not present

## 2017-07-17 ENCOUNTER — Ambulatory Visit (INDEPENDENT_AMBULATORY_CARE_PROVIDER_SITE_OTHER): Payer: BLUE CROSS/BLUE SHIELD | Admitting: Adult Health

## 2017-07-17 ENCOUNTER — Encounter: Payer: Self-pay | Admitting: Adult Health

## 2017-07-17 VITALS — BP 128/86 | HR 88 | Temp 97.5°F | Resp 18 | Ht 63.0 in | Wt 212.6 lb

## 2017-07-17 DIAGNOSIS — J4521 Mild intermittent asthma with (acute) exacerbation: Secondary | ICD-10-CM | POA: Diagnosis not present

## 2017-07-17 DIAGNOSIS — J209 Acute bronchitis, unspecified: Secondary | ICD-10-CM

## 2017-07-17 DIAGNOSIS — J029 Acute pharyngitis, unspecified: Secondary | ICD-10-CM | POA: Diagnosis not present

## 2017-07-17 MED ORDER — AZITHROMYCIN 250 MG PO TABS: ORAL_TABLET | ORAL | 1 refills | Status: AC

## 2017-07-17 MED ORDER — BENZONATATE 100 MG PO CAPS: 200.0000 mg | ORAL_CAPSULE | Freq: Three times a day (TID) | ORAL | 0 refills | Status: DC | PRN

## 2017-07-17 MED ORDER — ALBUTEROL SULFATE HFA 108 (90 BASE) MCG/ACT IN AERS: 2.0000 | INHALATION_SPRAY | RESPIRATORY_TRACT | 0 refills | Status: DC | PRN

## 2017-07-17 NOTE — Patient Instructions (Addendum)
Continue tylenol/NSAID for fever. 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  PUSH FLUIDS!!!!!  Please call us if you don't do not start feeling better within a few days, or have a fever uncontrolled by tylenol/ibuprofen.     HOW TO TREAT VIRAL COUGH AND COLD SYMPTOMS:  -Symptoms usually last at least 1 week with the worst symptoms being around day 4.  - colds usually start with a sore throat and end with a cough, and the cough can take 2 weeks to get better.  -No antibiotics are needed for colds, flu, sore throats, cough, bronchitis UNLESS symptoms are longer than 7 days OR if you are getting better then get drastically worse.  -There are a lot of combination medications (Dayquil, Nyquil, Vicks 44, tyelnol cold and sinus, ETC). Please look at the ingredients on the back so that you are treating the correct symptoms and not doubling up on medications/ingredients.    Medicines you can use  Nasal congestion  - pseudoephedrine (Sudafed)- behind the counter, do not use if you have high blood pressure, medicine that have -D in them.  - phenylephrine (Sudafed PE) -Dextormethorphan + chlorpheniramine (Coridcidin HBP)- okay if you have high blood pressure -Oxymetazoline (Afrin) nasal spray- LIMIT to 3 days -Saline nasal spray -Neti pot (used distilled or bottled water)  Ear pain/congestion  -pseudoephedrine (sudafed) - Nasonex/flonase nasal spray  Fever  -Acetaminophen (Tyelnol) -Ibuprofen (Advil, motrin, aleve)  Sore Throat  -Acetaminophen (Tyelnol) -Ibuprofen (Advil, motrin, aleve) -Drink a lot of water -Gargle with salt water - Rest your voice (don't talk) -Throat sprays -Cough drops  Body Aches  -Acetaminophen (Tyelnol) -Ibuprofen (Advil, motrin, aleve)  Headache  -Acetaminophen (Tyelnol) -Ibuprofen (Advil, motrin, aleve) - Exedrin, Exedrin Migraine  Allergy symptoms (cough, sneeze, runny nose, itchy eyes) -Claritin or loratadine cheapest  but likely the weakest  -Zyrtec or certizine at night because it can make you sleepy -The strongest is allegra or fexafinadine  Cheapest at [employer], [employer], [employer]  Cough  -Dextromethorphan (Delsym)- medicine that has DM in it -Guafenesin (Mucinex/Robitussin) - cough drops - drink lots of water  Chest Congestion  -Guafenesin (Mucinex/Robitussin)  Red Itchy Eyes  - Naphcon-A  Upset Stomach  - Bland diet (nothing spicy, greasy, fried, and high acid foods like tomatoes, oranges, berries) -OKAY- cereal, bread, soup, crackers, rice -Eat smaller more frequent meals -reduce caffeine, no alcohol -Loperamide (Imodium-AD) if diarrhea -Prevacid for heart burn  General health when sick  -Hydration -wash your hands frequently -keep surfaces clean -change pillow cases and sheets often -Get fresh air but do not exercise strenuously -Vitamin D, double up on it - Vitamin C -Zinc         Acute Bronchitis, Adult Acute bronchitis is sudden (acute) swelling of the air tubes (bronchi) in the lungs. Acute bronchitis causes these tubes to fill with mucus, which can make it hard to breathe. It can also cause coughing or wheezing. In adults, acute bronchitis usually goes away within 2 weeks. A cough caused by bronchitis may last up to 3 weeks. Smoking, allergies, and asthma can make the condition worse. Repeated episodes of bronchitis may cause further lung problems, such as chronic obstructive pulmonary disease (COPD). What are the causes? This condition can be caused by germs and by substances that irritate the lungs, including:  Cold and flu viruses. This condition is most often caused by the same virus that causes a cold.  Bacteria.  Exposure to tobacco smoke, dust, fumes, and air pollution.  What increases the risk? This condition is more likely to develop in people who:  Have close contact with someone with acute bronchitis.  Are exposed to lung irritants, such as tobacco smoke,  dust, fumes, and vapors.  Have a weak immune system.  Have a respiratory condition such as asthma.  What are the signs or symptoms? Symptoms of this condition include:  A cough.  Coughing up clear, yellow, or green mucus.  Wheezing.  Chest congestion.  Shortness of breath.  A fever.  Body aches.  Chills.  A sore throat.  How is this diagnosed? This condition is usually diagnosed with a physical exam. During the exam, your health care provider may order tests, such as chest X-rays, to rule out other conditions. He or she may also:  Test a sample of your mucus for bacterial infection.  Check the level of oxygen in your blood. This is done to check for pneumonia.  Do a chest X-ray or lung function testing to rule out pneumonia and other conditions.  Perform blood tests.  Your health care provider will also ask about your symptoms and medical history. How is this treated? Most cases of acute bronchitis clear up over time without treatment. Your health care provider may recommend:  Drinking more fluids. Drinking more makes your mucus thinner, which may make it easier to breathe.  Taking a medicine for a fever or cough.  Taking an antibiotic medicine.  Using an inhaler to help improve shortness of breath and to control a cough.  Using a cool mist vaporizer or humidifier to make it easier to breathe.  Follow these instructions at home: Medicines  Take over-the-counter and prescription medicines only as told by your health care provider.  If you were prescribed an antibiotic, take it as told by your health care provider. Do not stop taking the antibiotic even if you start to feel better. General instructions  Get plenty of rest.  Drink enough fluids to keep your urine clear or pale yellow.  Avoid smoking and secondhand smoke. Exposure to cigarette smoke or irritating chemicals will make bronchitis worse. If you smoke and you need help quitting, ask your health  care provider. Quitting smoking will help your lungs heal faster.  Use an inhaler, cool mist vaporizer, or humidifier as told by your health care provider.  Keep all follow-up visits as told by your health care provider. This is important. How is this prevented? To lower your risk of getting this condition again:  Wash your hands often with soap and water. If soap and water are not available, use hand sanitizer.  Avoid contact with people who have cold symptoms.  Try not to touch your hands to your mouth, nose, or eyes.  Make sure to get the flu shot every year.  Contact a health care provider if:  Your symptoms do not improve in 2 weeks of treatment. Get help right away if:  You cough up blood.  You have chest pain.  You have severe shortness of breath.  You become dehydrated.  You faint or keep feeling like you are going to faint.  You keep vomiting.  You have a severe headache.  Your fever or chills gets worse. This information is not intended to replace advice given to you by your health care provider. Make sure you discuss any questions you have with your health care provider. Document Released: 11/28/2004 Document Revised: 05/15/2016 Document Reviewed: 04/10/2016 Elsevier Interactive Patient Education  2017 [employer].

## 2017-07-17 NOTE — Progress Notes (Signed)
Subjective:    Patient ID: Haley White, female    DOB: 1956/06/27, 61 y.o.   MRN: [id number]  HPI BP 128/86   Pulse 88   Temp (!) 97.5 F (36.4 C)   Resp 18   Ht 5\' 3"  (1.6 m)   Wt 212 lb 9.6 oz (96.4 kg)   LMP 11/26/2011   BMI 37.66 kg/m   4 y.o. WF, school teacher with multiple sick contacts, presents today c/o sore throat, and headache that began on Monday, and is gradually worsening. She has also been out of work since Tuesday. She reports that she is experiencing a full sensation in her ears, and reports symptoms have shifted and now has sinus pressure. She has had a fever on and off between 99.5-100.5 for which she has taken tylenol/aleve. She presents without fever today. She has also taken mucinex with minimal improvement.      She reports a history of reactive airway disease, reports experiencing some wheezing, and states she normally has an albuterol inhaler but has run out.   She endorses environmental allergies and has been taking allegra regularly.    Current Outpatient Prescriptions on File Prior to Visit  Medication Sig  . FLUoxetine (PROZAC) 20 MG capsule Take 1 capsule (20 mg total) by mouth daily. For depression  . methylphenidate 54 MG PO CR tablet Take 54 mg by mouth every morning.   No current facility-administered medications on file prior to visit.    has Hypertension; Anxiety; T2_NIDDM; Obese; Medication management; Vitamin D deficiency; Hyperlipidemia; Suicide attempt by drug ingestion ([place]); Steroid-induced depression; and Major depressive disorder, recurrent, severe without psychotic features ([place]) on her problem list.  Allergies  Allergen Reactions  . Biaxin [Clarithromycin]     GI upset  . Serevent [Salmeterol]     Palpitations  . Tequin [Gatifloxacin]     GI upset. thrush     Review of Systems ROS    Constitutional: Positive fever, chills, fatigue. Negative diaphoresis, weight change.  HENT: Positive congestion, rhinorrhea, sinus  pressure, sore throat. Negative ear pain, facial swelling/pain, trouble swallowing.  Respiratory: Positive wheezing, cough. Negative, CP, SOB.  Cardiovascular: Negative CP, palpitations.  GI: Negative Musc: Denies arthralgias/myalgias.  Neuro: Denies vision changes, dizziness, sensory changes or weakness.  Skin: Denies rash.    Objective:   Physical Exam  Constitutional: She is oriented to person, place, and time. She appears well-developed and well-nourished. No distress.  HENT:  Head: Normocephalic.  Right Ear: Hearing normal. No mastoid tenderness. Tympanic membrane is not injected, not retracted and not bulging. A middle ear effusion is present.  Left Ear: Hearing normal. No mastoid tenderness. Tympanic membrane is not injected, not retracted and not bulging. A middle ear effusion is present.  Nose: Rhinorrhea present. No sinus tenderness or septal deviation.  Mouth/Throat: Uvula is midline. No oral lesions. Posterior oropharyngeal erythema present. No oropharyngeal exudate, posterior oropharyngeal edema or tonsillar abscesses.  Neck: Normal range of motion. Neck supple.  Cardiovascular: Normal rate, regular rhythm and normal heart sounds.  Exam reveals no gallop and no friction rub.   No murmur heard. Pulmonary/Chest: Effort normal. No respiratory distress. She has no wheezes. She has rales (Scattered ).  Abdominal: Soft. Bowel sounds are normal. There is no tenderness.  Lymphadenopathy:    She has no cervical adenopathy.  Neurological: She is alert and oriented to person, place, and time.  Skin: Skin is warm and dry. No rash noted. She is not diaphoretic. No pallor.  Psychiatric:  She has a normal mood and affect.       Assessment & Plan:  Haley White was seen today for sinusitis, headache and sore throat.   Diagnoses and all orders for this visit:  Acute bronchitis, unspecified organism -     albuterol (VENTOLIN HFA) 108 (90 Base) MCG/ACT inhaler; Inhale 2 puffs into the lungs  every 4 (four) hours as needed for wheezing or shortness of breath. Please give generic or the one that insurance covers -     azithromycin (ZITHROMAX) 250 MG tablet; Take 2 tablets (500 mg) on  Day 1,  followed by 1 tablet (250 mg) once daily on Days 2 through 5. -     benzonatate (TESSALON PERLES) 100 MG capsule; Take 2 capsules (200 mg total) by mouth 3 (three) times daily as needed for cough (Max: 600mg  per day).  Pharyngitis, unspecified etiology -     azithromycin (ZITHROMAX) 250 MG tablet; Take 2 tablets (500 mg) on  Day 1,  followed by 1 tablet (250 mg) once daily on Days 2 through 5. -     benzonatate (TESSALON PERLES) 100 MG capsule; Take 2 capsules (200 mg total) by mouth 3 (three) times daily as needed for cough (Max: 600mg  per day).  Mild intermittent reactive airway disease with acute exacerbation -     albuterol (VENTOLIN HFA) 108 (90 Base) MCG/ACT inhaler; Inhale 2 puffs into the lungs every 4 (four) hours as needed for wheezing or shortness of breath. Please give generic or the one that insurance covers    Information for caring for cold symptoms and bronchitis provided.  Take prescribed medications as directed.  Continue tylenol/aleve for HA/fever Push fluids Continue mucinex, OTC nasal spray Follow up if not improving significantly, SOB/severe wheezing despite albuterol, or high fever not controlled with medication.

## 2017-07-22 ENCOUNTER — Emergency Department (HOSPITAL_COMMUNITY): Payer: BLUE CROSS/BLUE SHIELD

## 2017-07-22 ENCOUNTER — Encounter (HOSPITAL_COMMUNITY): Payer: Self-pay | Admitting: Emergency Medicine

## 2017-07-22 ENCOUNTER — Emergency Department (HOSPITAL_COMMUNITY)
Admission: EM | Admit: 2017-07-22 | Discharge: 2017-07-22 | Disposition: A | Discharge status: Home / Self Care | Payer: BLUE CROSS/BLUE SHIELD | Attending: Emergency Medicine | Admitting: Emergency Medicine

## 2017-07-22 DIAGNOSIS — R079 Chest pain, unspecified: Secondary | ICD-10-CM | POA: Diagnosis not present

## 2017-07-22 DIAGNOSIS — E669 Obesity, unspecified: Secondary | ICD-10-CM | POA: Diagnosis not present

## 2017-07-22 DIAGNOSIS — I10 Essential (primary) hypertension: Secondary | ICD-10-CM | POA: Diagnosis not present

## 2017-07-22 DIAGNOSIS — R0602 Shortness of breath: Secondary | ICD-10-CM | POA: Diagnosis not present

## 2017-07-22 DIAGNOSIS — R51 Headache: Secondary | ICD-10-CM | POA: Diagnosis not present

## 2017-07-22 DIAGNOSIS — Z87891 Personal history of nicotine dependence: Secondary | ICD-10-CM | POA: Diagnosis not present

## 2017-07-22 DIAGNOSIS — J4541 Moderate persistent asthma with (acute) exacerbation: Secondary | ICD-10-CM | POA: Diagnosis not present

## 2017-07-22 DIAGNOSIS — R519 Headache, unspecified: Secondary | ICD-10-CM

## 2017-07-22 DIAGNOSIS — Z6837 Body mass index (BMI) 37.0-37.9, adult: Secondary | ICD-10-CM | POA: Diagnosis not present

## 2017-07-22 DIAGNOSIS — R05 Cough: Secondary | ICD-10-CM | POA: Diagnosis not present

## 2017-07-22 LAB — BASIC METABOLIC PANEL
Anion gap: 15 (ref 5–15)
BUN: 11 mg/dL (ref 6–20)
CO2: 20 mmol/L — ABNORMAL LOW (ref 22–32)
CREATININE: 0.7 mg/dL (ref 0.44–1.00)
Calcium: 8.9 mg/dL (ref 8.9–10.3)
Chloride: 109 mmol/L (ref 101–111)
GFR calc Af Amer: >60 mL/min (ref >60)
GLUCOSE: 101 mg/dL — AB (ref 65–99)
POTASSIUM: 3.9 mmol/L (ref 3.5–5.1)
Sodium: 144 mmol/L (ref 135–145)

## 2017-07-22 LAB — CBC WITH DIFFERENTIAL/PLATELET
Basophils Absolute: 0 10*3/uL (ref 0.0–0.1)
Basophils Relative: 1 %
EOS PCT: 6 %
Eosinophils Absolute: 0.4 10*3/uL (ref 0.0–0.7)
HCT: 38.5 % (ref 36.0–46.0)
Hemoglobin: 13.2 g/dL (ref 12.0–15.0)
LYMPHS PCT: 48 %
Lymphs Abs: 3 10*3/uL (ref 0.7–4.0)
MCH: 31.2 pg (ref 26.0–34.0)
MCHC: 34.3 g/dL (ref 30.0–36.0)
MCV: 91 fL (ref 78.0–100.0)
MONOS PCT: 6 %
Monocytes Absolute: 0.4 10*3/uL (ref 0.1–1.0)
Neutro Abs: 2.4 10*3/uL (ref 1.7–7.7)
Neutrophils Relative %: 39 %
PLATELETS: 302 10*3/uL (ref 150–400)
RBC: 4.23 MIL/uL (ref 3.87–5.11)
RDW: 12.4 % (ref 11.5–15.5)
WBC: 6.2 10*3/uL (ref 4.0–10.5)

## 2017-07-22 LAB — BRAIN NATRIURETIC PEPTIDE: B Natriuretic Peptide: 45.5 pg/mL (ref 0.0–100.0)

## 2017-07-22 IMAGING — CR DG CHEST 2V
2 series · 2 of 2 positions shown · non-contrast
Comparison: [DATE]

CLINICAL DATA: Cough, congestion, and pleuritic chest pain.
Shortness of breath.

EXAM:
CHEST  2 VIEW

[w chest pa]
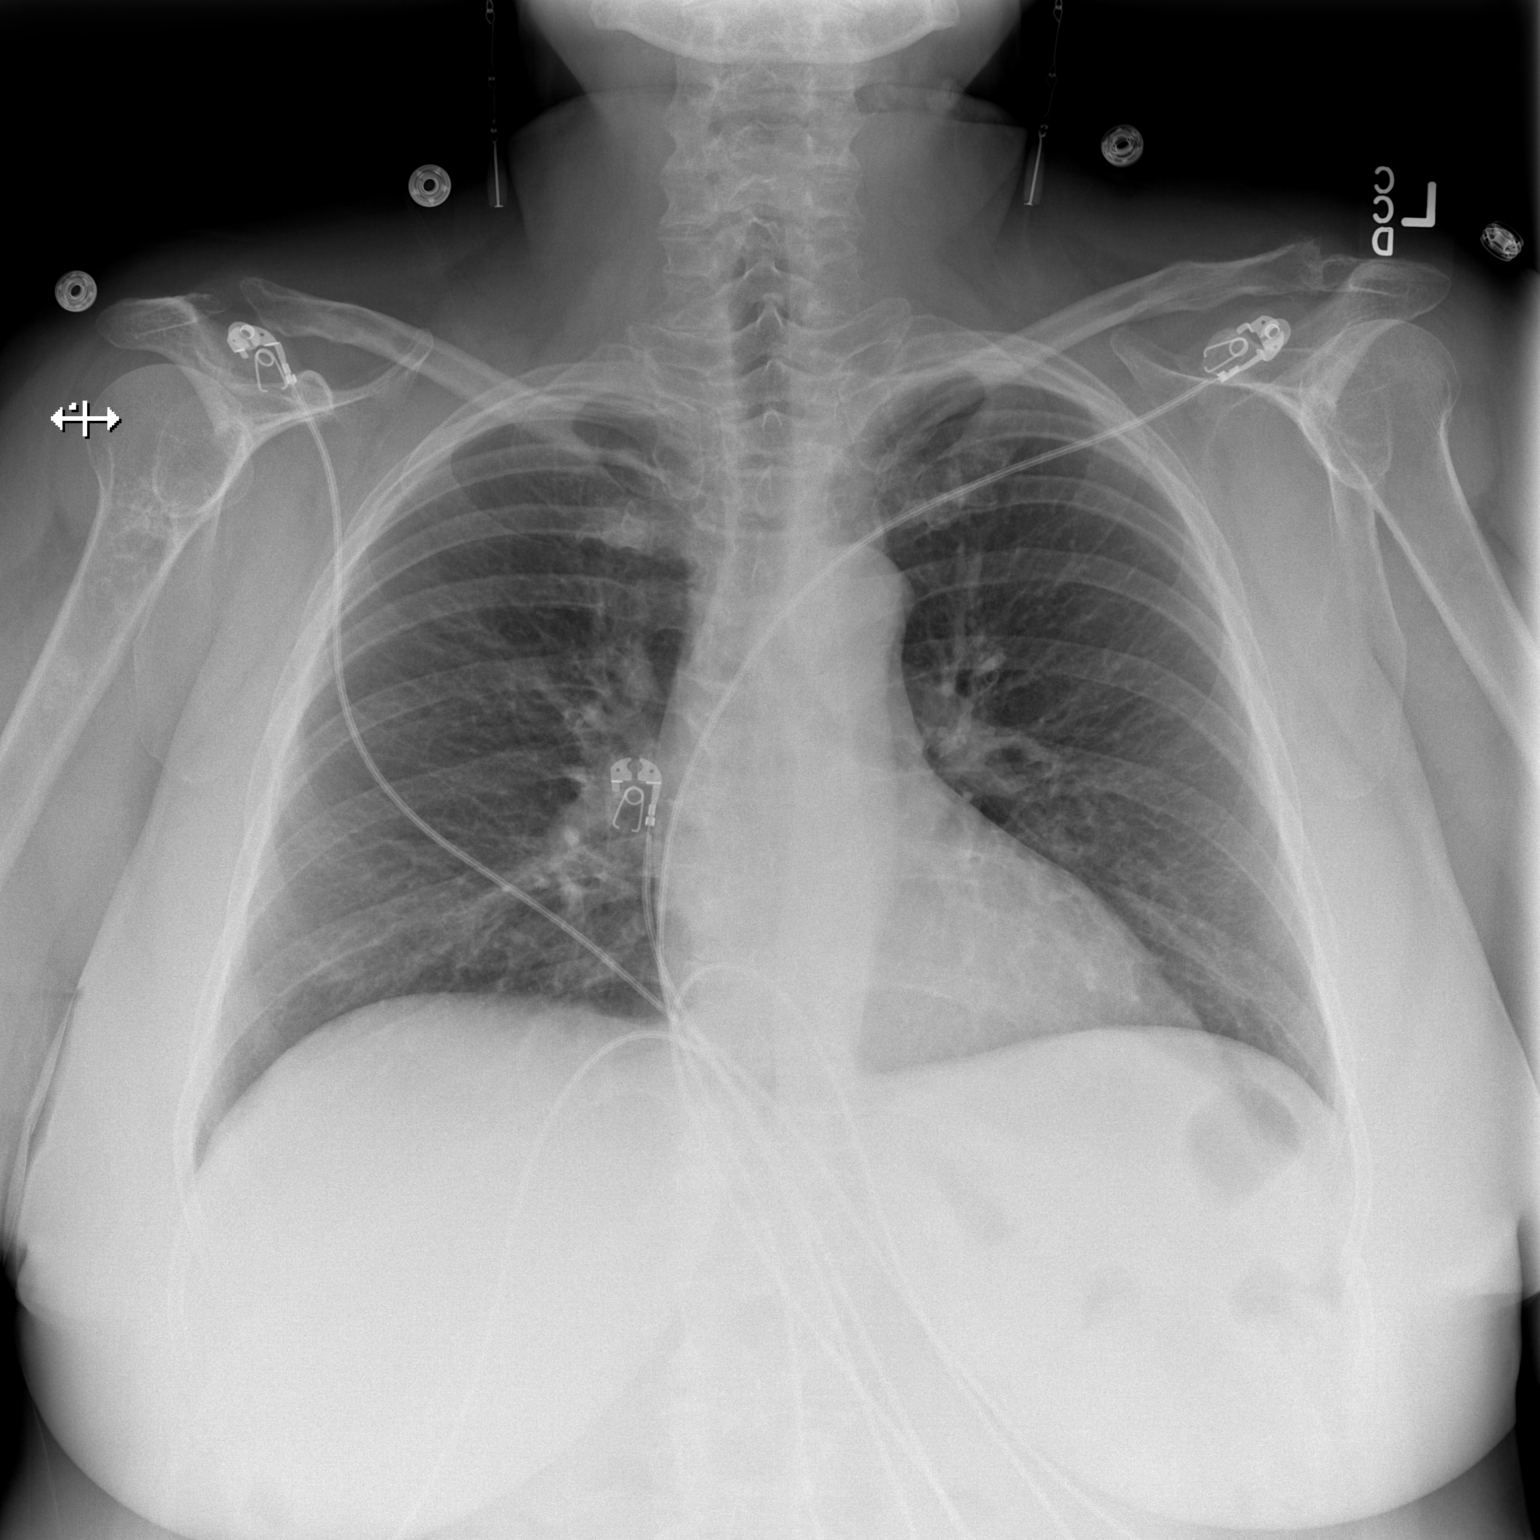

[w chest lat]
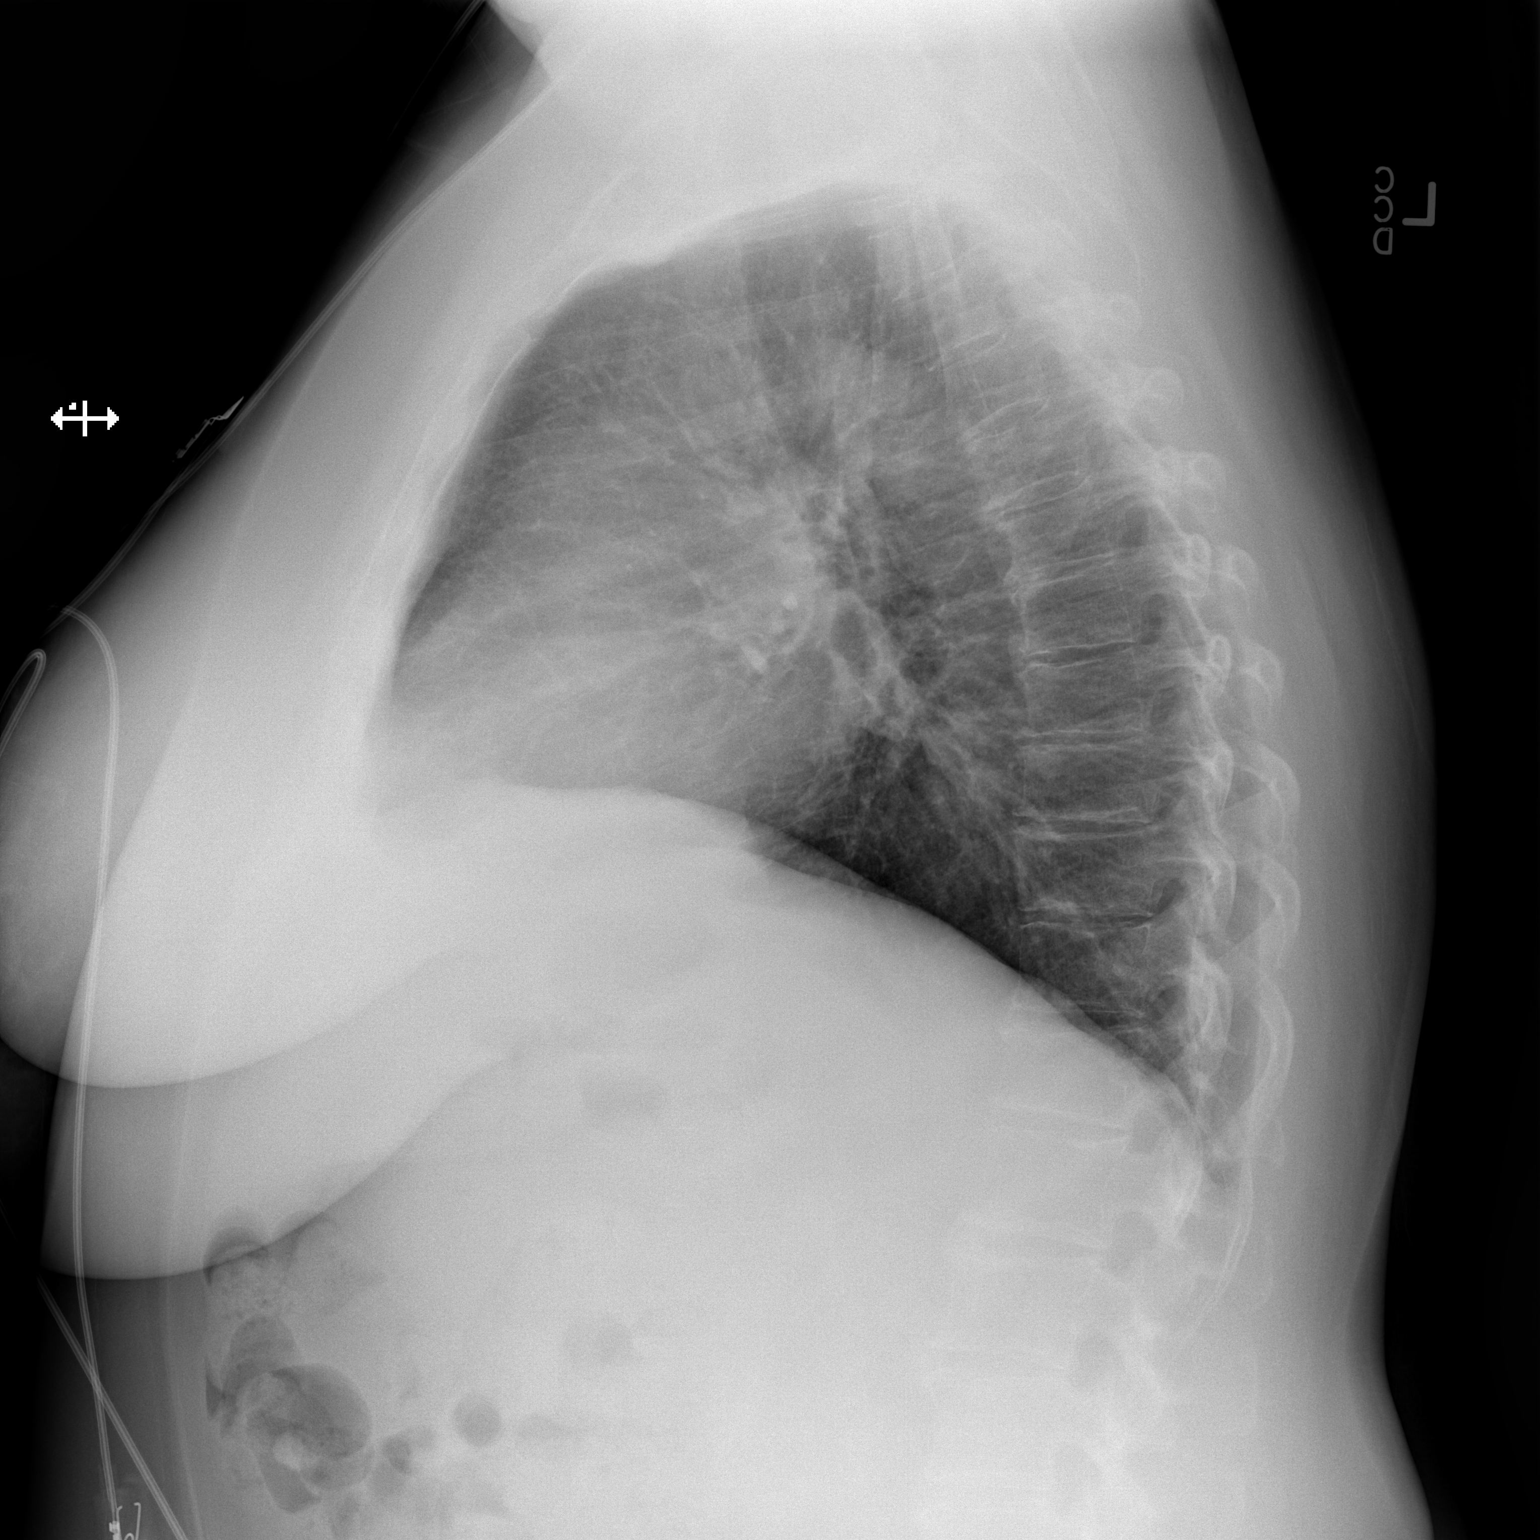

[2 of 2 positions shown; findings below may reference images not displayed]

FINDINGS: The lungs appear clear.  Cardiac and mediastinal contours normal.

No pleural effusion identified.

Thoracic spondylosis.

Sclerosis and lucency in the right proximal humerus is unchanged
from [V2] and may reflect enchondroma.
IMPRESSION: 1.  No active cardiopulmonary disease is radiographically apparent.
2. Potential enchondroma proximally in the right humerus, no change
3. Thoracic spondylosis.

## 2017-07-22 MED ORDER — IPRATROPIUM-ALBUTEROL 0.5-2.5 (3) MG/3ML IN SOLN
3.0000 mL | Freq: Once | RESPIRATORY_TRACT | Status: AC
  Administered 2017-07-22: 3 mL via RESPIRATORY_TRACT
  Filled 2017-07-22: qty 3

## 2017-07-22 MED ORDER — KETOROLAC TROMETHAMINE 15 MG/ML IJ SOLN
15.0000 mg | Freq: Once | INTRAMUSCULAR | Status: AC
  Administered 2017-07-22: 15 mg via INTRAVENOUS
  Filled 2017-07-22: qty 1

## 2017-07-22 MED ORDER — GUAIFENESIN-CODEINE 100-10 MG/5ML PO SOLN: 10.0000 mL | Freq: Three times a day (TID) | ORAL | 0 refills | Status: DC | PRN

## 2017-07-22 MED ORDER — BENZONATATE 100 MG PO CAPS: 100.0000 mg | ORAL_CAPSULE | Freq: Once | ORAL | Status: DC

## 2017-07-22 MED ORDER — METOCLOPRAMIDE HCL 5 MG/ML IJ SOLN
10.0000 mg | Freq: Once | INTRAMUSCULAR | Status: AC
  Administered 2017-07-22: 10 mg via INTRAVENOUS
  Filled 2017-07-22: qty 2

## 2017-07-22 MED ORDER — DEXAMETHASONE SODIUM PHOSPHATE 10 MG/ML IJ SOLN
10.0000 mg | Freq: Once | INTRAMUSCULAR | Status: AC
  Administered 2017-07-22: 10 mg via INTRAVENOUS
  Filled 2017-07-22: qty 1

## 2017-07-22 MED ORDER — GUAIFENESIN-CODEINE 100-10 MG/5ML PO SOLN
10.0000 mL | Freq: Once | ORAL | Status: AC
  Administered 2017-07-22: 10 mL via ORAL
  Filled 2017-07-22: qty 10

## 2017-07-22 NOTE — ED Triage Notes (Signed)
Per [facility] pt from home, diagnosed with bronchitis about a week ago by PCP and given z pack which finished last night. Still having cough, congestion, pain with inspiration. EMS called out for [place] of breath.   134/82 82 HR 20 RR 97% on RA  CBG 114

## 2017-07-22 NOTE — ED Notes (Signed)
Patient given ginger ale and crackers. 

## 2017-07-22 NOTE — Discharge Instructions (Signed)
We saw you in the ER for headaches. We are not sure what is causing your headaches - and we want you to see a Neurologist for further evaluation if the headache persists.  We saw you in the ER for your asthma related complains. We gave you some breathing treatments in the ER, and seems like your symptoms have improved. Please take albuterol as needed every 4 hours. Please take the medications prescribed. Please refrain from smoking or smoke exposure. Please see a primary care doctor in 1 week. Return to the ER if your symptoms worsen.

## 2017-07-22 NOTE — ED Notes (Signed)
Patient transported to X-ray 

## 2017-07-22 NOTE — ED Notes (Signed)
Bed: WA20 Expected date:  Expected time:  Means of arrival:  Comments: EMS- 61yo F, SOB, Hx of bronchitis

## 2017-07-23 NOTE — ED Provider Notes (Signed)
[facility] DEPT Provider Note   CSN: [id number] Arrival date & time: 07/22/17  0855     History   Chief Complaint Chief Complaint  Patient presents with  . Bronchitis    HPI Haley White is a 61 y.o. female.  HPI  Pt comes in with cc of DIB. Pt has hx of asthma. She also has been recently treated for bronchitis with antibiotics. Reports that while at work today she got really short of breath. Pt continues to have URI like symptoms. She also has pain with cough over her chest.   Pt has no hx of PE, DVT and denies any exogenous hormone use, long distance travels or surgery in the past 6 weeks, active cancer, recent immobilization.   Past Medical History:  Diagnosis Date  . Anxiety   . Arthritis   . Asthma   . Cataract   . Gout   . Hypertension   . Obese     Patient Active Problem List   Diagnosis Date Noted  . Major depressive disorder, recurrent, severe without psychotic features ([place])   . 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 Suicide attempt by drug ingestion ([place])   . Medication management 08/22/2014  . Vitamin D deficiency 08/22/2014  . Hyperlipidemia 08/22/2014  . Hypertension   . Anxiety   . T2_NIDDM   . Obese     Past Surgical History:  Procedure Laterality Date  . APPENDECTOMY  1984  . CATARACT EXTRACTION W/ INTRAOCULAR LENS  IMPLANT, BILATERAL  2014  . [place]   X3  . FOOT SURGERY Left 2002  . KNEE SURGERY Left 1998  . ROTATOR CUFF REPAIR Right 2007  . TUBAL LIGATION  1994    OB History    No data available       Home Medications    Prior to Admission medications   Medication Sig Start Date End Date Taking? Authorizing Provider  albuterol (VENTOLIN HFA) 108 (90 Base) MCG/ACT inhaler Inhale 2 puffs into the lungs every 4 (four) hours as needed for wheezing or shortness of breath. 07/17/17  Yes Liane Comber, NP  benzonatate (TESSALON PERLES) 100 MG capsule Take 2 capsules (200 mg total) by mouth 3  (three) times daily as needed for cough (Max: 600mg  per day). 07/17/17  Yes Liane Comber, NP  FLUoxetine (PROZAC) 20 MG capsule Take 1 capsule (20 mg total) by mouth daily. For depression 05/08/15  Yes Nwoko, Herbert Pun I, NP  ibuprofen (ADVIL,MOTRIN) 200 MG tablet Take 600 mg by mouth every 6 (six) hours as needed for headache.   Yes [provider]  methylphenidate 54 MG PO CR tablet Take 54 mg by mouth every morning.   Yes [provider]  guaiFENesin-codeine 100-10 MG/5ML syrup Take 10 mLs by mouth 3 (three) times daily as needed for cough. 07/22/17   Varney Biles, MD    Family History Family History  Problem Relation Age of Onset  . Dementia Father   . Anxiety disorder Sister   . Colon cancer Neg Hx     Social History Social History  Substance Use Topics  . Smoking status: Former Smoker    Quit date: 11/04/1977  . Smokeless tobacco: Never Used  . Alcohol use 3.6 oz/week    6 Glasses of wine per week     Comment: occasional     Allergies   Biaxin [clarithromycin]; Serevent [salmeterol]; and Tequin [gatifloxacin]   Review of Systems Review of Systems  HENT: Positive for congestion.  Respiratory: Positive for cough and shortness of breath.   Cardiovascular: Positive for chest pain.  Allergic/Immunologic: Negative for immunocompromised state.  All other systems reviewed and are negative.    Physical Exam Updated Vital Signs BP (!) 148/86 (BP Location: Right Arm)   Pulse 79   Temp 97.8 F (36.6 C) (Oral)   Resp 17   Ht 5\' 3"  (1.6 m)   Wt 96.2 kg (212 lb)   LMP 11/26/2011   SpO2 96%   BMI 37.55 kg/m   Physical Exam  Constitutional: She is oriented to person, place, and time. She appears well-developed and well-nourished.  HENT:  Head: Normocephalic and atraumatic.  Mouth/Throat: No oropharyngeal exudate.  Eyes: Pupils are equal, round, and reactive to light. EOM are normal.  Neck: Normal range of motion. Neck supple.  Cardiovascular: Normal  rate, regular rhythm and normal heart sounds.   Pulmonary/Chest: Effort normal. No respiratory distress. She has no wheezes. She has no rales.  Abdominal: Soft. Bowel sounds are normal. She exhibits no distension. There is no tenderness. There is no rebound and no guarding.  Lymphadenopathy:    She has no cervical adenopathy.  Neurological: She is alert and oriented to person, place, and time.  Skin: Skin is warm and dry.  Nursing note and vitals reviewed.    ED Treatments / Results  Labs (all labs ordered are listed, but only abnormal results are displayed) Labs Reviewed  BASIC METABOLIC PANEL - Abnormal; Notable for the following:       Result Value   CO2 20 (*)    Glucose, Bld 101 (*)    All other components within normal limits  CBC WITH DIFFERENTIAL/PLATELET  BRAIN NATRIURETIC PEPTIDE    EKG  EKG Interpretation  Date/Time:  Tuesday July 22 2017 10:46:57 EDT Ventricular Rate:  76 PR Interval:    QRS Duration: 94 QT Interval:  418 QTC Calculation: 470 R Axis:   8 Text Interpretation:  Sinus rhythm Low voltage, precordial leads Consider anterior infarct No acute changes No old tracing to compare Confirmed by Varney Biles [phone]) on 07/22/2017 11:45:18 AM       Radiology Dg Chest 2 View  Result Date: 07/22/2017 CLINICAL DATA:  Cough, congestion, and pleuritic chest pain. Shortness of breath. EXAM: CHEST  2 VIEW COMPARISON:  01/03/2014 FINDINGS: The lungs appear clear.  Cardiac and mediastinal contours normal. No pleural effusion identified. Thoracic spondylosis. Sclerosis and lucency in the right proximal humerus is unchanged from 2010 and may reflect enchondroma. IMPRESSION: 1.  No active cardiopulmonary disease is radiographically apparent. 2. 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 3. Thoracic spondylosis. Electronically Signed   By: Van Clines M.D.   On: 07/22/2017 09:56    Procedures Procedures (including critical  care time)  Medications Ordered in ED Medications  ketorolac (TORADOL) 15 MG/ML injection 15 mg (15 mg Intravenous Given 07/22/17 0957)  metoCLOPramide (REGLAN) injection 10 mg (10 mg Intravenous Given 07/22/17 0957)  ipratropium-albuterol (DUONEB) 0.5-2.5 (3) MG/3ML nebulizer solution 3 mL (3 mLs Nebulization Given 07/22/17 0958)  dexamethasone (DECADRON) injection 10 mg (10 mg Intravenous Given 07/22/17 0957)  guaiFENesin-codeine 100-10 MG/5ML solution 10 mL (10 mLs Oral Given 07/22/17 0957)     Initial Impression / Assessment and Plan / ED Course  I have reviewed the triage vital signs and the nursing notes.  Pertinent labs & imaging results that were available during my care of the patient were reviewed by me and considered in my medical  decision making (see chart for details).     Pt comes in with DIB. She has hx of asthma. Received nebs en route and felt better. Exam here is normal. She is having URI like symptoms for several days now and there is associated chest pain. PE pretest probability is low, and URI / asthma is higher in the ddx. Pt given nebs and she felt better. She agreed to decadron iv here, doesn't want a rx for steroids. Pt's Xrays look normal. Strict ER return precautions have been discussed, and patient is agreeing with the plan and is comfortable with the workup done and the recommendations from the ER.  Pt also has a headache x 2-3 weeks. She declined CT for now. Pt's headache is intermittent and when present it is is evere. Headache is not positional. Pt has no associated nausea, vomiting, seizures, loss of consciousness or new visual complains, weakness, numbness, dizziness or gait instability. She will be provided with neuro f/u. Explained to patient that headaches that are new at her age will need further workup.   Safe for d/c with pcp f/u.  Final Clinical Impressions(s) / ED Diagnoses   Final diagnoses:  Moderate persistent asthma with exacerbation  Bad  headache    New Prescriptions Discharge Medication List as of 07/22/2017 12:38 PM    START taking these medications   Details  guaiFENesin-codeine 100-10 MG/5ML syrup Take 10 mLs by mouth 3 (three) times daily as needed for cough., Starting Tue 07/22/2017, Print         Varney Biles, MD 07/23/17 2022

## 2017-07-28 ENCOUNTER — Ambulatory Visit (INDEPENDENT_AMBULATORY_CARE_PROVIDER_SITE_OTHER): Payer: BLUE CROSS/BLUE SHIELD | Admitting: Internal Medicine

## 2017-07-28 ENCOUNTER — Encounter: Payer: Self-pay | Admitting: Internal Medicine

## 2017-07-28 VITALS — BP 146/88 | HR 100 | Temp 97.3°F | Resp 18 | Ht 63.0 in | Wt 214.0 lb

## 2017-07-28 DIAGNOSIS — J042 Acute laryngotracheitis: Secondary | ICD-10-CM

## 2017-07-28 DIAGNOSIS — J4520 Mild intermittent asthma, uncomplicated: Secondary | ICD-10-CM | POA: Diagnosis not present

## 2017-07-28 MED ORDER — DEXAMETHASONE 0.5 MG PO TABS: ORAL_TABLET | ORAL | 0 refills | Status: DC

## 2017-07-28 MED ORDER — LEVOFLOXACIN 500 MG PO TABS: ORAL_TABLET | ORAL | 0 refills | Status: AC

## 2017-07-28 MED ORDER — MONTELUKAST SODIUM 10 MG PO TABS: ORAL_TABLET | ORAL | 3 refills | Status: DC

## 2017-07-28 NOTE — Progress Notes (Signed)
  Subjective:    Patient ID: Haley White, female    DOB: [birth year], 61 y.o.   MRN: [id number]  HPI    This nice 61 yo WWF with remote hx/o Asthma  was evaluated 07/17/17 for an acute bronchitis (w/o asthma) and treated with a Z-Pak, Ventolin MDI and Tessalon perles. She persisted with sx's and eval/Tx'd at [facility] ER on 07/22/17 with a neg CBC, EKG & CXR and was treated with IV Decadron and Duoneb and symptomatically improved. She returns today with c/o coughing & wheezing. Scant sputum is yellowish & greenish.   Medication Sig  . albuterol (VENTOLIN HFA) 108 (90 Base) MCG/ACT inhaler Inhale 2 puffs into the lungs every 4 (four) hours as needed for wheezing or shortness of breath.  Marland Kitchen FLUoxetine (PROZAC) 20 MG capsule Take 1 capsule (20 mg total) by mouth daily. For depression  . guaiFENesin-codeine 100-10 MG/5ML syrup Take 10 mLs by mouth 3 (three) times daily as needed for cough.  Marland Kitchen ibuprofen (ADVIL,MOTRIN) 200 MG tablet Take 600 mg by mouth every 6 (six) hours as needed for headache.  . methylphenidate 54 MG PO CR tablet Take 54 mg by mouth every morning.  . benzonatate (TESSALON PERLES) 100 MG capsule Take 2 capsules (200 mg total) by mouth 3 (three) times daily as needed for cough (Max: 600mg  per day).   Allergies  Allergen Reactions  . Biaxin [Clarithromycin]     GI upset  . Serevent [Salmeterol]     Palpitations  . Tequin [Gatifloxacin]     GI upset. thrush   Review of Systems  10 point systems review negative except as above.    Objective:   Physical Exam  BP (!) 146/88   Pulse 100   Temp (!) 97.3 F (36.3 C)   Resp 18   Ht 5\' 3"  (1.6 m)   Wt 214 lb (97.1 kg)   LMP 11/26/2011   BMI 37.91 kg/m  O2 sat 98%  Hoarse raspy voice. Dry wheezy cough. No respiratory stridor.  HEENT - WNL. Neck - supple.  Chest - Decreased Rockie Neighbours BS with few forced end terminal post-tussive wheezes.  Cor - Nl HS. RRR w/o sig MGR. PP 1(+). No edema. MS- FROM w/o deformities.  Gait Nl. Neuro -  Nl  w/o focal abnormalities.     Assessment & Plan:   1. Laryngotracheitis  - levofloxacin (LEVAQUIN) 500 MG tablet; Take 1 tablet daily with food for Infection  Dispense: 7 tablet; Refill: 0 - dexamethasone (DECADRON) 0.5 MG tablet; Take 1 tab 3 x day - 3 days, then 2 x day - 3 days, then 1 tab daily  Dispense: 20 tablet; Refill: 0  2. Mild intermittent asthma without complication  - dexamethasone  0.5 MG tablet; Take 1 tab 3 x day - 3 days, then 2 x day - 3 days, then 1 tab daily  Disp: 20 tablet; Refill: 0 - montelukast 10 MG tablet; Take 1 tablet daily for allergies/Asthma  Disp: 90 tablet; Refill: 3  - discussed meds & SE's.  - Sx Trelegy x 14 doses/days  & instructed in technique

## 2017-08-15 ENCOUNTER — Ambulatory Visit (INDEPENDENT_AMBULATORY_CARE_PROVIDER_SITE_OTHER): Payer: BLUE CROSS/BLUE SHIELD | Admitting: Internal Medicine

## 2017-08-15 ENCOUNTER — Encounter: Payer: Self-pay | Admitting: Internal Medicine

## 2017-08-15 VITALS — BP 152/90 | HR 80 | Temp 97.5°F | Resp 20 | Ht 63.0 in

## 2017-08-15 DIAGNOSIS — J042 Acute laryngotracheitis: Secondary | ICD-10-CM

## 2017-08-15 MED ORDER — DEXAMETHASONE 0.5 MG PO TABS: ORAL_TABLET | ORAL | 0 refills | Status: DC

## 2017-08-15 MED ORDER — PROMETHAZINE-DM 6.25-15 MG/5ML PO SYRP: ORAL_SOLUTION | ORAL | 1 refills | Status: DC

## 2017-08-15 NOTE — Progress Notes (Signed)
  Subjective:    Patient ID: Haley White, female    DOB: 02-01-1956, 61 y.o.   MRN: [id number]  HPI   This nice 61 yo D/WWF presents for f/u with persistent hoarseness and cough. She's completed courses on Z-Pak and then Levaquin w/ improved  resolution of her yellowish -green expectoration to current whitish sputum. She has over the last 2-3 days developed worsening hoarseness & Laryngitis. Denies fever, chills, sweats dyspnea or rash.   Medication Sig  . albuterol (VENTOLIN HFA) 108 (90 Base) MCG/ACT inhaler Inhale 2 puffs into the lungs every 4 (four) hours as needed for wheezing or shortness of breath.  Marland Kitchen FLUoxetine (PROZAC) 20 MG capsule Take 1 capsule (20 mg total) by mouth daily. For depression  . guaiFENesin-codeine 100-10 MG/5ML syrup Take 10 mLs by mouth 3 (three) times daily as needed for cough.  Marland Kitchen ibuprofen (ADVIL,MOTRIN) 200 MG tablet Take 600 mg by mouth every 6 (six) hours as needed for headache.  . methylphenidate 54 MG PO CR tablet Take 54 mg by mouth every morning.  . montelukast (SINGULAIR) 10 MG tablet Take 1 tablet daily for allergies/Asthma  . dexamethasone (DECADRON) 0.5 MG tablet Take 1 tab 3 x day - 3 days, then 2 x day - 3 days, then 1 tab daily   Allergies  Allergen Reactions  . Biaxin [Clarithromycin]     GI upset  . Serevent [Salmeterol]     Palpitations  . Tequin [Gatifloxacin]     GI upset. thrush   Past Medical History:  Diagnosis Date  . Anxiety   . Arthritis   . Asthma   . Cataract   . Gout   . Hypertension   . Obese    Review of Systems  10 point systems review negative except as above.    Objective:   Physical Exam  BP (!) 152/90   Pulse 80   Temp (!) 97.5 F (36.4 C)   Resp 20   Ht 5\' 3"  (1.6 m)   LMP 11/26/2011   Hoarse raspy voice & speech.   HEENT - WNL. Neck - supple.  Chest - Few scattered rales . No Wheezes. Cor - Nl HS. RRR w/o sig MGR. PP 1(+). No edema. MS- FROM w/o deformities.  Gait Nl. Neuro -  Nl w/o focal  abnormalities.    Assessment & Plan:   1. Laryngotracheitis  - dexamethasone (DECADRON) 0.5 MG tablet; Take 1 tab 3 x day - 3 days, then 2 x day - 3 days, then 1 tab daily  Dispense: 20 tablet; Refill: 0  - discussed meds/SE's. Advised to call if develops putrid sputum again

## 2017-09-18 ENCOUNTER — Encounter: Payer: Self-pay | Admitting: Physician Assistant

## 2017-09-18 ENCOUNTER — Ambulatory Visit (INDEPENDENT_AMBULATORY_CARE_PROVIDER_SITE_OTHER): Payer: BLUE CROSS/BLUE SHIELD | Admitting: Physician Assistant

## 2017-09-18 VITALS — BP 140/80 | HR 86 | Temp 97.5°F | Resp 16 | Ht 63.0 in | Wt 216.4 lb

## 2017-09-18 DIAGNOSIS — E041 Nontoxic single thyroid nodule: Secondary | ICD-10-CM | POA: Diagnosis not present

## 2017-09-18 DIAGNOSIS — J042 Acute laryngotracheitis: Secondary | ICD-10-CM | POA: Diagnosis not present

## 2017-09-18 NOTE — Patient Instructions (Addendum)
Voice rest, sugar free or hard candy, salt water gargles going to do samples of dexilant x 10 days,  Get back on singulair albuterol with spacer 2 x a  Day for 1 week Get on nasocort at night see below I'm sending your to ENT  Go to the ER if any chest pain, shortness of breath, nausea, dizziness, severe HA, changes vision/speech  Can do a steroid nasal spary 1-2 sparys at night each nostril. Remember to spray each nostril twice towards the outer part of your eye.  Do not sniff but instead pinch your nose and tilt your head back to help the medicine get into your sinuses.  The best time to do this is at bedtime. Stop if you get blurred vision or nose bleeds.    Common causes of cough OR hoarseness OR sore throat:   Allergies, Viral Infections, Acid Reflux and Bacterial Infections.   Allergies and viral infections cause a cough OR sore throat by post nasal drip and are often worse at night, can also have sneezing, lower grade fevers, clear/yellow mucus. This is best treated with allergy medications or nasal sprays.  Please get on allegra for 1-2 weeks The strongest is allegra or fexafinadine  Cheapest at [employer], [employer], [employer]   Bacterial infections are more severe than allergies or viral infections with fever, teeth pain, fatigue. This can be treated with prednisone and the same over the counter medication and after 7 days can be treated with an antibiotic.   Silent reflux/GERD can cause a cough OR sore throat OR hoarseness WITHOUT heart burn because the esophagus that goes to the stomach and trachea that goes to the lungs are very close and when you lay down the acid can irritate your throat and lungs. This can cause hoarseness, cough, and wheezing. Please stop any alcohol or anti-inflammatories like aleve/advil/ibuprofen and start an over the counter Prilosec or omeprazole 1-2 times daily 53mins before food for 2 weeks, then switch to over the counter zantac/ratinidine or pepcid/famotadine  once at night for 2 weeks.    sometimes irritation causes more irritation. Try voice rest, use sugar free cough drops to prevent coughing, and try to stop clearing your throat.   If you ever have a cough that does not go away after trying these things please make a follow up visit for further evaluation or we can refer you to a specialist. Or if you ever have shortness of breath or chest pain go to the ER.

## 2017-09-18 NOTE — Progress Notes (Signed)
Subjective:    Patient ID: Haley White, female    DOB: Jun 03, 1956, 61 y.o.   MRN: [id number]  HPI 61 y.o. WF presents with laryngitis x 2 days, states has had asthma when she was in her 30-40's, nothing before that.  She saw Dr. Melford Aase 07/28/2017 and 08/15/2017, she was also in the ER 09/18 for asthma and seen 07/17/2017 for bronchitis, she has been on zpak, levaquin, decadron dose 08/15/2017.  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  She feels she has a "rubber band" around her throat, she has hoarseness, coughs in middle of the night, worse with talking. Some wheezing with coughing/SOB WITH talking.  No fever or chills. She is not on Singulair, she is doing albuterol AS needed has done 2 x this past week.   Blood pressure 140/80, pulse 86, temperature (!) 97.5 F (36.4 C), resp. rate 16, height 5\' 3"  (1.6 m), weight 216 lb 6.4 oz (98.2 kg), last menstrual period 11/26/2011, SpO2 97 %.  Medications Current Outpatient Medications on File Prior to Visit  Medication Sig  . albuterol (VENTOLIN HFA) 108 (90 Base) MCG/ACT inhaler Inhale 2 puffs into the lungs every 4 (four) hours as needed for wheezing or shortness of breath.  Marland Kitchen FLUoxetine (PROZAC) 20 MG capsule Take 1 capsule (20 mg total) by mouth daily. For depression  . guaiFENesin-codeine 100-10 MG/5ML syrup Take 10 mLs by mouth 3 (three) times daily as needed for cough.  Marland Kitchen ibuprofen (ADVIL,MOTRIN) 200 MG tablet Take 600 mg by mouth every 6 (six) hours as needed for headache.  . methylphenidate 54 MG PO CR tablet Take 54 mg by mouth every morning.  . montelukast (SINGULAIR) 10 MG tablet Take 1 tablet daily for allergies/Asthma  . promethazine-dextromethorphan (PROMETHAZINE-DM) 6.25-15 MG/5ML syrup Take 1 to 2 tsp enery 4 hours if needed for cough   No current facility-administered medications on file prior to visit.     Problem list She has Hypertension; Anxiety; T2_NIDDM; Obese; Medication management; Vitamin  D deficiency; Hyperlipidemia; Suicide attempt by drug ingestion ([place]); Steroid-induced depression; and Major depressive disorder, recurrent, severe without psychotic features ([place]) on their problem list.   Review of Systems  Constitutional: Negative.  Negative for chills and fever.  HENT: Positive for postnasal drip and rhinorrhea. Negative for congestion, ear discharge, ear pain, facial swelling and sneezing.   Eyes: Negative.   Respiratory: Positive for cough. Negative for apnea, choking, chest tightness, shortness of breath, wheezing and stridor.   Cardiovascular: Negative.  Negative for chest pain.  Gastrointestinal: Negative.       Objective:   Physical Exam  Constitutional: She appears well-developed and well-nourished.  HENT:  Head: Normocephalic and atraumatic.  Right Ear: External ear normal.  Nose: Right sinus exhibits maxillary sinus tenderness. Right sinus exhibits no frontal sinus tenderness. Left sinus exhibits maxillary sinus tenderness. Left sinus exhibits no frontal sinus tenderness.  Mouth/Throat: Oropharynx is clear and moist and mucous membranes are normal. No posterior oropharyngeal edema or posterior oropharyngeal erythema.  Eyes: Conjunctivae and EOM are normal.  Neck: Trachea normal and normal range of motion. Neck supple. Thyroid mass (nodules) present.  Cardiovascular: Normal rate, regular rhythm, normal heart sounds and intact distal pulses.  Pulmonary/Chest: Effort normal and breath sounds normal. No respiratory distress. She has no wheezes. She has no rhonchi. She has no rales.  CTAB  Abdominal: Soft. Bowel sounds are normal. There is tenderness in the epigastric area.  Lymphadenopathy:  She has no cervical adenopathy.  Skin: Skin is warm and dry.      Assessment & Plan:  Laryngitis Voice rest, sugar free or hard candy, salt water gargles going to do samples of dexilant x 10 days,  Get back on singulair Albuterol with spacer Get on nasocort  samples Refer ENT for evaluation Lungs sound CTAB, if ENT is normal may need PFTS  Thyroid tenderness and nodules right side Get [place]

## 2017-09-22 DIAGNOSIS — J040 Acute laryngitis: Secondary | ICD-10-CM | POA: Diagnosis not present

## 2017-09-22 DIAGNOSIS — R490 Dysphonia: Secondary | ICD-10-CM | POA: Diagnosis not present

## 2017-09-24 ENCOUNTER — Other Ambulatory Visit: Payer: Self-pay | Admitting: Physician Assistant

## 2017-09-24 DIAGNOSIS — E041 Nontoxic single thyroid nodule: Secondary | ICD-10-CM

## 2017-09-24 DIAGNOSIS — J309 Allergic rhinitis, unspecified: Secondary | ICD-10-CM

## 2017-10-07 NOTE — Progress Notes (Signed)
[facility] Unk Pinto, M.D.     Uvaldo Bristle. Silverio Lay, P.A.-C Liane Comber, [place] [address] [place], [place] [address] Telephone [phone] Telefax [phone] Annual Screening/Preventative Visit & Comprehensive Evaluation &  Examination     This very nice 61 y.o. [name] presents for a Screening/Preventative Visit & comprehensive evaluation and management of multiple medical co-morbidities.  Patient has been followed for HTN, PreDM, Hyperlipidemia and Vitamin D Deficiency. 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       HTN predates since 2008. Patient's BP has been controlled at home and patient denies any cardiac symptoms as chest pain, palpitations, shortness of breath, dizziness or ankle swelling. Today's BP is at goal - 132/62.      Patient's hyperlipidemia is not controlled with diet and medications. Patient denies myalgias or other medication SE's. Last lipids were not at goal: Lab Results  Component Value Date   CHOL 226 (H) 03/30/2015   HDL 55 03/30/2015   LDLCALC 150 (H) 03/30/2015   TRIG 104 03/30/2015   CHOLHDL 4.1 03/30/2015      Patient has Morbid Obesity (BMI 37+) and consequent PreDiabetes (5.9% / 2014) attempting diet control and patient denies reactive hypoglycemic symptoms, visual blurring, diabetic polys, or paresthesias. Last A1c was not at goal - goal:  Lab Results  Component Value Date   HGBA1C 5.9 (H) 03/30/2015      Finally, patient has history of Vitamin D Deficiency ("20"/2010) and is not on recommended supplements.   Current Outpatient Medications on File Prior to Visit  Medication Sig  . albuterol (VENTOLIN HFA) 108 (90 Base) MCG/ACT inhaler Inhale 2 puffs into the lungs every 4 (four) hours as needed for wheezing or shortness of breath.  Marland Kitchen FLUoxetine (PROZAC) 20 MG capsule Take  1 capsule (20 mg total) by mouth daily. For depression  . guaiFENesin-codeine 100-10 MG/5ML syrup Take 10 mLs by mouth 3 (three) times daily as needed for cough.  Marland Kitchen ibuprofen (ADVIL,MOTRIN) 200 MG tablet Take 600 mg by mouth every 6 (six) hours as needed for headache.  . methylphenidate 54 MG PO CR tablet Take 54 mg by mouth every morning.  . montelukast (SINGULAIR) 10 MG tablet Take 1 tablet daily for allergies/Asthma  . promethazine-dextromethorphan (PROMETHAZINE-DM) 6.25-15 MG/5ML syrup Take 1 to 2 tsp enery 4 hours if needed for cough   No current facility-administered medications on file prior to visit.    Allergies  Allergen Reactions  . Biaxin [Clarithromycin]     GI upset  . Serevent [Salmeterol]     Palpitations  . Tequin [Gatifloxacin]     GI upset. thrush   Past Medical History:  Diagnosis Date  . Anxiety   . Arthritis   . Asthma   . Cataract   . Gout   . Hypertension   . Obese    Health Maintenance  Topic Date Due  . Hepatitis C Screening  27-May-1956  . FOOT EXAM  01/16/1967  . OPHTHALMOLOGY EXAM  01/16/1967  . URINE MICROALBUMIN  01/16/1967  . HIV Screening  01/16/1972  . PNEUMOCOCCAL POLYSACCHARIDE VACCINE (2) 11/04/2000  . MAMMOGRAM  09/23/2011  . HEMOGLOBIN A1C  09/30/2015  . PAP SMEAR  08/13/2016  . TETANUS/TDAP  11/04/2018  . COLONOSCOPY  11/16/2018  . INFLUENZA VACCINE  Completed   Immunization History  Administered Date(s) Administered  . PPD Test 10/08/2017  .  Pneumococcal Polysaccharide-23 10/08/2017  . Pneumococcal-Unspecified 11/05/1995  . Tdap 11/04/2008   Past Surgical History:  Procedure Laterality Date  . APPENDECTOMY  1984  . CATARACT EXTRACTION W/ INTRAOCULAR LENS  IMPLANT, BILATERAL  2014  . [place]   X3  . FOOT SURGERY Left 2002  . KNEE SURGERY Left 1998  . ROTATOR CUFF REPAIR Right 2007  . TUBAL LIGATION  1994   Family History  Problem Relation Age of Onset  . Dementia Father   . Anxiety  disorder Sister   . Colon cancer Neg Hx    Social History   Tobacco Use  . Smoking status: Former Smoker    Last attempt to quit: 11/04/1977    Years since quitting: 39.9  . Smokeless tobacco: Never Used  Substance Use Topics  . Alcohol use: Yes    Alcohol/week: 3.6 oz    Types: 6 Glasses of wine per week    Comment: occasional  . Drug use: No    ROS Constitutional: Denies fever, chills, weight loss/gain, headaches, insomnia,  night sweats, and change in appetite. Does c/o fatigue. Eyes: Denies redness, blurred vision, diplopia, discharge, itchy, watery eyes.  ENT: Denies discharge, congestion, post nasal drip, epistaxis, sore throat, earache, hearing loss, dental pain, Tinnitus, Vertigo, Sinus pain, snoring.  Cardio: Denies chest pain, palpitations, irregular heartbeat, syncope, dyspnea, diaphoresis, orthopnea, PND, claudication, edema Respiratory: denies cough, dyspnea, DOE, pleurisy, hoarseness, laryngitis, wheezing.  Gastrointestinal: Denies dysphagia, heartburn, reflux, water brash, pain, cramps, nausea, vomiting, bloating, diarrhea, constipation, hematemesis, melena, hematochezia, jaundice, hemorrhoids Genitourinary: Denies dysuria, frequency, urgency, nocturia, hesitancy, discharge, hematuria, flank pain Breast: Breast lumps, nipple discharge, bleeding.  Musculoskeletal: Denies arthralgia, myalgia, stiffness, Jt. Swelling, pain, limp, and strain/sprain. Denies falls. Skin: Denies puritis, rash, hives, warts, acne, eczema, changing in skin lesion Neuro: No weakness, tremor, incoordination, spasms, paresthesia, pain Psychiatric: Denies confusion, memory loss, sensory loss. Denies Depression. Endocrine: Denies change in weight, skin, hair change, nocturia, and paresthesia, diabetic polys, visual blurring, hyper / hypo glycemic episodes.  Heme/Lymph: No excessive bleeding, bruising, enlarged lymph nodes.  Physical Exam  BP 132/62   Pulse 76   Temp (!) 97.3 F (36.3 C)   Resp  16   Ht 5\' 3"  (1.6 m)   Wt 218 lb 9.6 oz (99.2 kg)   LMP 11/26/2011   BMI 38.72 kg/m   General Appearance: Over nourished, well groomed and in no apparent distress.  Eyes: PERRLA, EOMs, conjunctiva no swelling or erythema, normal fundi and vessels. Sinuses: No frontal/maxillary tenderness ENT/Mouth: EACs patent / TMs  nl. Nares clear without erythema, swelling, mucoid exudates. Oral hygiene is good. No erythema, swelling, or exudate. Tongue normal, non-obstructing. Tonsils not swollen or erythematous. Hearing normal.  Neck: Supple, thyroid normal. No bruits, nodes or JVD. Respiratory: Respiratory effort normal.  BS equal and clear bilateral with few scattered  Rales & rrhonci, and no wheezing or stridor. Cardio: Heart sounds are normal with regular rate and rhythm and no murmurs, rubs or gallops. Peripheral pulses are normal and equal bilaterally without edema. No aortic or femoral bruits. Chest: symmetric with normal excursions and percussion. Breasts: Symmetric, without lumps, nipple discharge, retractions, or fibrocystic changes.  Abdomen: Flat, soft with bowel sounds active. Nontender, no guarding, rebound, hernias, masses, or organomegaly.  Lymphatics: Non tender without lymphadenopathy.  Genitourinary:  Musculoskeletal: Full ROM all peripheral extremities, joint stability, 5/5 strength, and normal gait. Skin: Warm and dry without rashes, lesions, cyanosis, clubbing or  ecchymosis.  Neuro:  Cranial nerves intact, reflexes equal bilaterally. Normal muscle tone, no cerebellar symptoms. Sensation intact.  Pysch: Alert and oriented X 3, normal affect, Insight and Judgment appropriate.   Assessment and Plan  1. Annual Preventative Screening Examination  2. Labile hypertension  - EKG 12-Lead - [place], RETROPERITNL ABD,  LTD - Urinalysis, Routine w reflex microscopic - Microalbumin / creatinine urine ratio - CBC with Differential/Platelet - BASIC METABOLIC PANEL WITH GFR - Magnesium -  TSH - DG Chest 2 View  3. Hyperlipidemia, mixed  - EKG 12-Lead - [place], RETROPERITNL ABD,  LTD - Hepatic function panel - Lipid panel - TSH  4. Prediabetes  - EKG 12-Lead - [place], RETROPERITNL ABD,  LTD - HM DIABETES FOOT EXAM - LOW EXTREMITY NEUR EXAM DOCUM - Hemoglobin A1c - Insulin, random  5. Vitamin D deficiency  - VITAMIN D 25 Hydroxy   6. Screening for colorectal cancer  - POC Hemoccult Bld/Stl (  7. Screening for ischemic heart disease  - EKG 12-Lead  8. Screening for AAA (aortic abdominal aneurysm)  - [place], RETROPERITNL ABD,  LTD  9. Fatigue, unspecified type  - Iron,Total/Total Iron Binding Cap - Vitamin B12 - TSH  10. Medication management  - Urinalysis, Routine w reflex microscopic - Microalbumin / creatinine urine ratio - CBC with Differential/Platelet - BASIC METABOLIC PANEL WITH GFR - Hepatic function panel - Magnesium - Lipid panel - TSH - Hemoglobin A1c - Insulin, random - VITAMIN D 25 Hydroxy (Vit-D Deficiency, Fractures)  11. Cough  - DG Chest 2 View - Culture, Respiratory  12. Bronchitis  - DG Chest 2 View - Culture, Respiratory  13. Screening examination for pulmonary tuberculosis  - PPD  14. Need for prophylactic vaccination against Streptococcus pneumoniae (pneumococcus)  - Pneumococcal polysaccharide vaccine 23-valent greater than or equal to 2yo subcutaneous/IM       Patient was counseled in prudent diet to achieve/maintain BMI less than 25 for weight control, BP monitoring, regular exercise and medications. Discussed med's effects and SE's. Screening labs and tests as requested with regular follow-up as recommended. Over 40 minutes of exam, counseling, chart review and high complex critical decision making was performed.

## 2017-10-07 NOTE — Patient Instructions (Signed)

## 2017-10-08 ENCOUNTER — Encounter: Payer: Self-pay | Admitting: Internal Medicine

## 2017-10-08 ENCOUNTER — Ambulatory Visit (INDEPENDENT_AMBULATORY_CARE_PROVIDER_SITE_OTHER): Payer: BLUE CROSS/BLUE SHIELD | Admitting: Internal Medicine

## 2017-10-08 VITALS — BP 132/62 | HR 76 | Temp 97.3°F | Resp 16 | Ht 63.0 in | Wt 218.6 lb

## 2017-10-08 DIAGNOSIS — J40 Bronchitis, not specified as acute or chronic: Secondary | ICD-10-CM

## 2017-10-08 DIAGNOSIS — E782 Mixed hyperlipidemia: Secondary | ICD-10-CM

## 2017-10-08 DIAGNOSIS — E559 Vitamin D deficiency, unspecified: Secondary | ICD-10-CM

## 2017-10-08 DIAGNOSIS — I10 Essential (primary) hypertension: Secondary | ICD-10-CM | POA: Diagnosis not present

## 2017-10-08 DIAGNOSIS — Z111 Encounter for screening for respiratory tuberculosis: Secondary | ICD-10-CM | POA: Diagnosis not present

## 2017-10-08 DIAGNOSIS — R5383 Other fatigue: Secondary | ICD-10-CM

## 2017-10-08 DIAGNOSIS — Z0000 Encounter for general adult medical examination without abnormal findings: Secondary | ICD-10-CM | POA: Diagnosis not present

## 2017-10-08 DIAGNOSIS — Z23 Encounter for immunization: Secondary | ICD-10-CM

## 2017-10-08 DIAGNOSIS — Z0001 Encounter for general adult medical examination with abnormal findings: Secondary | ICD-10-CM

## 2017-10-08 DIAGNOSIS — Z79899 Other long term (current) drug therapy: Secondary | ICD-10-CM

## 2017-10-08 DIAGNOSIS — R7303 Prediabetes: Secondary | ICD-10-CM

## 2017-10-08 DIAGNOSIS — R0989 Other specified symptoms and signs involving the circulatory and respiratory systems: Secondary | ICD-10-CM

## 2017-10-08 DIAGNOSIS — R05 Cough: Secondary | ICD-10-CM

## 2017-10-08 DIAGNOSIS — Z136 Encounter for screening for cardiovascular disorders: Secondary | ICD-10-CM

## 2017-10-08 DIAGNOSIS — R059 Cough, unspecified: Secondary | ICD-10-CM

## 2017-10-08 DIAGNOSIS — Z1211 Encounter for screening for malignant neoplasm of colon: Secondary | ICD-10-CM

## 2017-10-08 DIAGNOSIS — Z1212 Encounter for screening for malignant neoplasm of rectum: Secondary | ICD-10-CM

## 2017-10-09 ENCOUNTER — Other Ambulatory Visit: Payer: Self-pay | Admitting: Internal Medicine

## 2017-10-09 ENCOUNTER — Encounter: Payer: Self-pay | Admitting: *Deleted

## 2017-10-09 LAB — CBC WITH DIFFERENTIAL/PLATELET
BASOS ABS: 60 {cells}/uL (ref 0–200)
Basophils Relative: 1 %
Eosinophils Absolute: 330 cells/uL (ref 15–500)
Eosinophils Relative: 5.5 %
HCT: 39.4 % (ref 35.0–45.0)
Hemoglobin: 13.2 g/dL (ref 11.7–15.5)
Lymphs Abs: 2124 cells/uL (ref 850–3900)
MCH: 32.1 pg (ref 27.0–33.0)
MCHC: 33.5 g/dL (ref 32.0–36.0)
MCV: 95.9 fL (ref 80.0–100.0)
MONOS PCT: 7.8 %
MPV: 10.6 fL (ref 7.5–12.5)
Neutro Abs: 3018 cells/uL (ref 1500–7800)
Neutrophils Relative %: 50.3 %
PLATELETS: 276 10*3/uL (ref 140–400)
RBC: 4.11 10*6/uL (ref 3.80–5.10)
RDW: 12.3 % (ref 11.0–15.0)
TOTAL LYMPHOCYTE: 35.4 %
WBC: 6 10*3/uL (ref 3.8–10.8)
WBCMIX: 468 {cells}/uL (ref 200–950)

## 2017-10-09 LAB — HEPATIC FUNCTION PANEL
AG RATIO: 1.7 (calc) (ref 1.0–2.5)
ALKALINE PHOSPHATASE (APISO): 69 U/L (ref 33–130)
ALT: 32 U/L — ABNORMAL HIGH (ref 6–29)
AST: 24 U/L (ref 10–35)
Albumin: 4 g/dL (ref 3.6–5.1)
BILIRUBIN DIRECT: 0.2 mg/dL (ref 0.0–0.2)
BILIRUBIN INDIRECT: 0.8 mg/dL (ref 0.2–1.2)
GLOBULIN: 2.4 g/dL (ref 1.9–3.7)
Total Bilirubin: 1 mg/dL (ref 0.2–1.2)
Total Protein: 6.4 g/dL (ref 6.1–8.1)

## 2017-10-09 LAB — INSULIN, RANDOM: Insulin: 10.4 u[IU]/mL (ref 2.0–19.6)

## 2017-10-09 LAB — URINALYSIS, ROUTINE W REFLEX MICROSCOPIC
Bilirubin Urine: NEGATIVE
GLUCOSE, UA: NEGATIVE
Hgb urine dipstick: NEGATIVE
Ketones, ur: NEGATIVE
LEUKOCYTES UA: NEGATIVE
Nitrite: NEGATIVE
PH: 5.5 (ref 5.0–8.0)
Protein, ur: NEGATIVE
SPECIFIC GRAVITY, URINE: 1.025 (ref 1.001–1.03)

## 2017-10-09 LAB — TSH: TSH: 0.97 m[IU]/L (ref 0.40–4.50)

## 2017-10-09 LAB — VITAMIN B12: VITAMIN B 12: 430 pg/mL (ref 200–1100)

## 2017-10-09 LAB — LIPID PANEL
CHOL/HDL RATIO: 4 (calc) (ref <5.0)
Cholesterol: 300 mg/dL — ABNORMAL HIGH (ref <200)
HDL: 75 mg/dL (ref >50)
LDL Cholesterol (Calc): 191 mg/dL (calc) — ABNORMAL HIGH
NON-HDL CHOLESTEROL (CALC): 225 mg/dL — AB (ref <130)
Triglycerides: 169 mg/dL — ABNORMAL HIGH (ref <150)

## 2017-10-09 LAB — MICROALBUMIN / CREATININE URINE RATIO
Creatinine, Urine: 230 mg/dL (ref 20–275)
MICROALB/CREAT RATIO: 3 ug/mg{creat} (ref <30)
Microalb, Ur: 0.8 mg/dL

## 2017-10-09 LAB — VITAMIN D 25 HYDROXY (VIT D DEFICIENCY, FRACTURES): Vit D, 25-Hydroxy: 57 ng/mL (ref 30–100)

## 2017-10-09 LAB — BASIC METABOLIC PANEL WITH GFR
BUN: 14 mg/dL (ref 7–25)
CHLORIDE: 105 mmol/L (ref 98–110)
CO2: 24 mmol/L (ref 20–32)
CREATININE: 0.77 mg/dL (ref 0.50–0.99)
Calcium: 9.1 mg/dL (ref 8.6–10.4)
GFR, EST AFRICAN AMERICAN: 97 mL/min/{1.73_m2} (ref >=60)
GFR, Est Non African American: 84 mL/min/{1.73_m2} (ref >=60)
GLUCOSE: 110 mg/dL — AB (ref 65–99)
Potassium: 4.2 mmol/L (ref 3.5–5.3)
SODIUM: 139 mmol/L (ref 135–146)

## 2017-10-09 LAB — HEMOGLOBIN A1C
EAG (MMOL/L): 6.2 (calc)
HEMOGLOBIN A1C: 5.5 %{Hb} (ref <5.7)
MEAN PLASMA GLUCOSE: 111 (calc)

## 2017-10-09 LAB — IRON, TOTAL/TOTAL IRON BINDING CAP
%SAT: 52 % (calc) — ABNORMAL HIGH (ref 11–50)
IRON: 131 ug/dL (ref 45–160)
TIBC: 253 ug/dL (ref 250–450)

## 2017-10-09 LAB — MAGNESIUM: Magnesium: 1.8 mg/dL (ref 1.5–2.5)

## 2017-10-09 MED ORDER — ROSUVASTATIN CALCIUM 40 MG PO TABS: ORAL_TABLET | ORAL | 5 refills | Status: DC

## 2017-10-10 DIAGNOSIS — J40 Bronchitis, not specified as acute or chronic: Secondary | ICD-10-CM | POA: Diagnosis not present

## 2017-10-10 DIAGNOSIS — R05 Cough: Secondary | ICD-10-CM | POA: Diagnosis not present

## 2017-10-11 LAB — RESPIRATORY CULTURE OR RESPIRATORY AND SPUTUM CULTURE: MICRO NUMBER:: 81379458

## 2017-10-17 DIAGNOSIS — J309 Allergic rhinitis, unspecified: Secondary | ICD-10-CM | POA: Diagnosis not present

## 2017-10-17 DIAGNOSIS — J4530 Mild persistent asthma, uncomplicated: Secondary | ICD-10-CM | POA: Diagnosis not present

## 2017-10-29 DIAGNOSIS — J301 Allergic rhinitis due to pollen: Secondary | ICD-10-CM | POA: Diagnosis not present

## 2017-11-05 DIAGNOSIS — J301 Allergic rhinitis due to pollen: Secondary | ICD-10-CM | POA: Diagnosis not present

## 2017-11-07 DIAGNOSIS — J301 Allergic rhinitis due to pollen: Secondary | ICD-10-CM | POA: Diagnosis not present

## 2017-11-10 DIAGNOSIS — J301 Allergic rhinitis due to pollen: Secondary | ICD-10-CM | POA: Diagnosis not present

## 2017-11-12 DIAGNOSIS — J301 Allergic rhinitis due to pollen: Secondary | ICD-10-CM | POA: Diagnosis not present

## 2017-11-14 DIAGNOSIS — F900 Attention-deficit hyperactivity disorder, predominantly inattentive type: Secondary | ICD-10-CM | POA: Diagnosis not present

## 2017-11-14 DIAGNOSIS — F3342 Major depressive disorder, recurrent, in full remission: Secondary | ICD-10-CM | POA: Diagnosis not present

## 2017-11-18 DIAGNOSIS — J301 Allergic rhinitis due to pollen: Secondary | ICD-10-CM | POA: Diagnosis not present

## 2017-11-21 DIAGNOSIS — J301 Allergic rhinitis due to pollen: Secondary | ICD-10-CM | POA: Diagnosis not present

## 2017-12-09 ENCOUNTER — Encounter: Payer: Self-pay | Admitting: Adult Health

## 2017-12-09 ENCOUNTER — Ambulatory Visit: Payer: BLUE CROSS/BLUE SHIELD | Admitting: Adult Health

## 2017-12-09 VITALS — BP 128/74 | HR 92 | Temp 97.7°F | Ht 63.0 in

## 2017-12-09 DIAGNOSIS — M1120 Other chondrocalcinosis, unspecified site: Secondary | ICD-10-CM

## 2017-12-09 DIAGNOSIS — M11271 Other chondrocalcinosis, right ankle and foot: Secondary | ICD-10-CM | POA: Diagnosis not present

## 2017-12-09 MED ORDER — TRAMADOL HCL 50 MG PO TABS: ORAL_TABLET | ORAL | 0 refills | Status: DC

## 2017-12-09 MED ORDER — DEXAMETHASONE 0.5 MG PO TABS: ORAL_TABLET | ORAL | 0 refills | Status: DC

## 2017-12-09 NOTE — Patient Instructions (Signed)
Tramadol for severe pain only; this is a controlled substance and could make you drowsy.   WHAT YOU NEED TO KNOW ABOUT OPIOID PAIN MEDICINES Opioids are strong prescription medications that are used to manage severe pain.  Opioids are better for acute and short term pain management and long term recent studies show that patients on long term opioid use have worse outcomes and more complications than other medications.   What are the serious risks of using opioids?  Too much opioid medication in your body can cause your breathing to STOP- which could lead to DEATH. This risk is greatest for people on other medications that make you sleepy like xanax, valium Ambien, or people with sleep apnea.   You can get ADDICTED to opioids even though you take them exactly as prescribed, especially if you take the for a long time.   If you take an opioid medication for more than a few days your body becomes physically "dependent." This is normal and it means your body has gotten use to the medicine. You should taper off the opioid slowly to avoid withdrawal symptoms.   Addiction can happen to anyone. It is when you crave the drug because they make you feel good in some way. You keep taking the drug even thought you know it is not a good idea and bad things are happening to you. Addiction is a brain disease and may require ongoing treatment. Please talk with your healthcare provider right away if you think you might be addicted.   What should I avoid taking while I am taking opioids?  These medications listed below when taken with an opioid can increase the risk of you stopping breathing and the risk of death   Alcohol: do not drink any kind of alcohol while taking opioids.  Benzodiazepines like valium or xanax  ** New study shows that unintential overdose was most likely to occur with concurrent benzo use  Muscle relaxants like Soma or Flexeril  Sleep medicines like Ambien or Lunesta  Other opioid  medicines  How can I take opioid pain medicine safely?  Take your opioid medication as prescribed.   Do not cut, break, chew, crush or dissolve your medicine.   When your healthcare provider gives you the prescriptions, ask:  How long should I take it for?  What should I do to taper off the medication?  Call your healthcare provider if your opioid medicine is not controlling your pain. DO NOT increase the dose on your own.   Do not share or give you opioid medicine to anyone else.   Store your medicine in a safe place where it can not be reached by children or stolen by family or visitors to your home.   Keep track of the amount of medicine you have.   Do NOT operate heavy machinery or drive while taking opioid medications. They can make you sleepy, dizzy, or lightheaded.         Calcium Pyrophosphate Deposition Calcium pyrophosphate deposition (CPPD), which is also called pseudogout, is a type of arthritis that causes pain, swelling, and inflammation in a joint. The joint pain can be severe and may last for days. If it is not treated, the pain may last much longer. Attacks of CPPD may come and go. This condition usually affects one joint at a time. The joints that are affected most commonly are the knees, but this condition can also affect the wrists, elbows, shoulders, or ankles. CPPD is similar to gout.  Both conditions result from the buildup of crystals in the joint. However, CPPD is caused by a type of crystal that is different than the crystals that cause gout. What are the causes? This condition is caused by the buildup of calcium pyrophosphate dihydrate crystals in the joint. The reason why this buildup occurs is not known. The condition may be passed down from parent to child (hereditary). What increases the risk? This condition is more likely to develop in people who:  Are over 27 years old.  Have a family history of the condition.  Have had joint replacement  surgery.  Have had a recent injury.  Have certain medical conditions, such as hemophilia, ochronosis, amyloidosis, or hormonal disorders.  Have low blood magnesium levels.  What are the signs or symptoms? Symptoms of this condition include:  Pain in a joint. The pain may: ? Be intense and constant. ? Come on quickly. ? Get worse with movement. ? Last from several days to a few weeks.  Redness, swelling, and warmth at the joint.  Stiffness of the joint.  How is this diagnosed? To diagnose this condition, your health care provider will use a needle to remove fluid from the joint. The fluid will be examined under a microscope to check for the crystals that cause CPPD. You may also have imaging tests, such as:  X-rays.  Ultrasound.  How is this treated? There is no way to remove the crystals from the joint and no way to cure this condition. However, treatment can relieve symptoms and improve joint function. Treatment may include:  Nonsteroidal anti-inflammatory drugs (NSAIDs) to reduce inflammation and pain.  Medicines to help prevent attacks.  Injections of medicine (cortisone) into the joint to reduce pain and swelling.  Physical therapy to improve joint function.  Follow these instructions at home:  Take medicines only as directed by your health care provider.  Rest the affected joints until your symptoms start to go away.  Keep your affected joints raised (elevated) when possible. This will help to reduce swelling.  If directed, apply ice to the affected area: ? Put ice in a plastic bag. ? Place a towel between your skin and the bag. ? Leave the ice on for 20 minutes, 2-3 times per day.  If the painful joint is in your leg, use crutches as directed by your health care provider.  When your symptoms start to go away, begin to exercise regularly or do physical therapy. Talk with your health care provider or physical therapist about what types of exercise are safe  for you. Low-impact exercise may be best. This includes walking, swimming, bicycling, and water aerobics.  Maintain a healthy weight so your joints do not need to bear more weight than necessary. Contact a health care provider if:  You have an increase in joint pain that is not relieved with medicine.  Your joint becomes more red, swollen, or stiff.  You have a fever.  You have a skin rash. This information is not intended to replace advice given to you by your health care provider. Make sure you discuss any questions you have with your health care provider. Document Released: 07/13/2004 Document Revised: 03/28/2016 Document Reviewed: 09/28/2014 Elsevier Interactive Patient Education  [employer].

## 2017-12-09 NOTE — Addendum Note (Signed)
Addended by: Izora Ribas on: 12/09/2017 05:51 PM   Modules accepted: Orders

## 2017-12-09 NOTE — Progress Notes (Signed)
Assessment and Plan:  Haley White was seen today for foot pain.  Diagnoses and all orders for this visit:  Pseudogout of ankle, right -     traMADol (ULTRAM) 50 MG tablet; Take 1 tab as needed for pain up to 4 times a day. -     dexamethasone (DECADRON) 0.5 MG tablet; Take 1 tablet three times a day for 3 days, then take 1 tablet twice daily for 3 days, then take 1 tablet daily for 5 days. Patient to monitor for signs of infection or systemic symptoms and present to ED for these Discussed appropriate opioid use at length; discussed risks and SE - information further provided on AVS  Further disposition pending results of labs. Discussed med's effects and SE's.   Over 15 minutes of exam, counseling, chart review, and critical decision making was performed.   Future Appointments  Date Time Provider [place]  03/17/2018  4:30 PM Unk Pinto, MD [facility] None  11/09/2018 10:00 AM Unk Pinto, MD [facility] None    ------------------------------------------------------------------------------------------------------------------   HPI BP 128/74   Pulse 92   Temp 97.7 F (36.5 C)   Ht 5\' 3"  (1.6 m)   LMP 11/26/2011   SpO2 97%   BMI 38.72 kg/m   62 y.o.female presents for L foot pain x2 days, no injury, becoming swollen and tender to touch - "feels like acid in my foot" - 10/10 with weight bearing, is using crutches. Area extremely tender to light touch, not injected. She has been taking 600 mg ibuprofen for pain; reports minimal improvement.   She reports hx of this previously dx by ortho; was told it was pseudogout - with the same location, identical symptoms. 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  She denies fever/chills, dizziness, HA, palpitations, rashes or other joint pain.   Past Medical History:  Diagnosis Date  . Anxiety   . Arthritis   . Asthma   . Cataract   .  Gout   . Hypertension   . Obese      Allergies  Allergen Reactions  . Biaxin [Clarithromycin]     GI upset  . Serevent [Salmeterol]     Palpitations  . Tequin [Gatifloxacin]     GI upset. thrush    Current Outpatient Medications on File Prior to Visit  Medication Sig  . albuterol (VENTOLIN HFA) 108 (90 Base) MCG/ACT inhaler Inhale 2 puffs into the lungs every 4 (four) hours as needed for wheezing or shortness of breath.  Marland Kitchen FLUoxetine (PROZAC) 20 MG capsule Take 1 capsule (20 mg total) by mouth daily. For depression  . ibuprofen (ADVIL,MOTRIN) 200 MG tablet Take 600 mg by mouth every 6 (six) hours as needed for headache.  . methylphenidate 54 MG PO CR tablet Take 54 mg by mouth every morning.  . montelukast (SINGULAIR) 10 MG tablet Take 1 tablet daily for allergies/Asthma  . rosuvastatin (CRESTOR) 40 MG tablet Take 1/2 to 1 tablet daily or as directed for Cholesterol (Patient taking differently: Take 1 tablet by mouth daily)  . guaiFENesin-codeine 100-10 MG/5ML syrup Take 10 mLs by mouth 3 (three) times daily as needed for cough. (Patient not taking: Reported on 12/09/2017)  . promethazine-dextromethorphan (PROMETHAZINE-DM) 6.25-15 MG/5ML syrup Take 1 to 2 tsp enery 4 hours if needed for cough (Patient not taking: Reported on 12/09/2017)   No current facility-administered medications on file prior to visit.     ROS: all negative except  above.   Physical Exam:  BP 128/74   Pulse 92   Temp 97.7 F (36.5 C)   Ht 5\' 3"  (1.6 m)   LMP 11/26/2011   SpO2 97%   BMI 38.72 kg/m   General Appearance: Well nourished, in no apparent distress. Neck: Supple.  Respiratory: Respiratory effort normal, BS equal bilaterally without rales, rhonchi, wheezing or stridor.  Cardio: RRR with no MRGs. Brisk peripheral pulses without edema.  Abdomen: Soft, + BS.  Non tender, no guarding, rebound, hernias, masses. Lymphatics: Non tender without lymphadenopathy.  Musculoskeletal: Full ROM, 5/5 strength,  walks with crutches.  R medial aspect of tarsal joint mildly swollen without injection, extremely tender to light palpation. Not notably warm to touch. Extreme discomfort with attempts to bear weight.  Skin: Warm, dry without rashes, lesions, ecchymosis.  Psych: Awake and oriented X 3, normal affect, Insight and Judgment appropriate.     Haley Ribas, NP 5:38 PM [facility]

## 2017-12-19 DIAGNOSIS — J301 Allergic rhinitis due to pollen: Secondary | ICD-10-CM | POA: Diagnosis not present

## 2017-12-26 DIAGNOSIS — J301 Allergic rhinitis due to pollen: Secondary | ICD-10-CM | POA: Diagnosis not present

## 2017-12-29 ENCOUNTER — Ambulatory Visit (INDEPENDENT_AMBULATORY_CARE_PROVIDER_SITE_OTHER): Payer: BLUE CROSS/BLUE SHIELD | Admitting: Physician Assistant

## 2017-12-29 VITALS — BP 116/82 | HR 88 | Temp 97.3°F | Resp 18 | Ht 63.0 in | Wt 216.4 lb

## 2017-12-29 DIAGNOSIS — B999 Unspecified infectious disease: Secondary | ICD-10-CM

## 2017-12-29 DIAGNOSIS — J028 Acute pharyngitis due to other specified organisms: Secondary | ICD-10-CM

## 2017-12-29 DIAGNOSIS — J40 Bronchitis, not specified as acute or chronic: Secondary | ICD-10-CM | POA: Diagnosis not present

## 2017-12-29 DIAGNOSIS — J042 Acute laryngotracheitis: Secondary | ICD-10-CM | POA: Diagnosis not present

## 2017-12-29 DIAGNOSIS — B3789 Other sites of candidiasis: Secondary | ICD-10-CM

## 2017-12-29 MED ORDER — FLUCONAZOLE 100 MG PO TABS: 100.0000 mg | ORAL_TABLET | Freq: Every day | ORAL | 0 refills | Status: AC

## 2017-12-29 NOTE — Patient Instructions (Signed)
Will send to pulmonary Do capful or 1-2 tsp of apple cider vinegar in green hot tea or warm water, gargle/swallow  Diflucan twice a day for 7-10 days.    Oral Thrush, Adult Oral thrush, also called oral candidiasis, is a fungal infection that develops in the mouth and throat and on the tongue. It causes white patches to form on the mouth and tongue. Ritta Slot is most common in older adults, but it can occur at any age. Many cases of thrush are mild, but this infection can also be serious. Ritta Slot can be a repeated (recurrent) problem for certain people who have a weak body defense system (immune system). The weakness can be caused by chronic illnesses, or by taking medicines that limit the body's ability to fight infection. If a person has difficulty fighting infection, the fungus that causes thrush can spread through the body. This can cause life-threatening blood or organ infections. What are the causes? This condition is caused by a fungus (yeast) called Candida albicans.  This fungus is normally present in small amounts in the mouth and on other mucous membranes. It usually causes no harm.  If conditions are present that allow the fungus to grow without control, it invades surrounding tissues and becomes an infection.  Other Candida species can also lead to thrush (rare).  What increases the risk? This condition is more likely to develop in:  People with a weakened immune system.  Older adults.  People with HIV (human immunodeficiency virus).  People with diabetes.  People with dry mouth (xerostomia).  Pregnant women.  People with poor dental care, especially people who have false teeth.  People who use antibiotic medicines.  What are the signs or symptoms? Symptoms of this condition can vary from mild and moderate to severe and persistent. Symptoms may include:  A burning feeling in the mouth and throat. This can occur at the start of a thrush infection.  White patches that  stick to the mouth and tongue. The tissue around the patches may be red, raw, and painful. If rubbed (during tooth brushing, for example), the patches and the tissue of the mouth may bleed easily.  A bad taste in the mouth or difficulty tasting foods.  A cottony feeling in the mouth.  Pain during eating and swallowing.  Poor appetite.  Cracking at the corners of the mouth.  How is this diagnosed? This condition is diagnosed based on:  Physical exam. Your health care provider will look in your mouth.  Health history. Your health care provider will ask you questions about your health.  How is this treated? This condition is treated with medicines called antifungals, which prevent the growth of fungi. These medicines are either applied directly to the affected area (topical) or swallowed (oral). The treatment will depend on the severity of the condition. Mild thrush Mild cases of thrush may clear up with the use of an antifungal mouth rinse or lozenges. Treatment usually lasts about 14 days. Moderate to severe thrush  More severe thrush infections that have spread to the esophagus are treated with an oral antifungal medicine. A topical antifungal medicine may also be used.  For some severe infections, treatment may need to continue for more than 14 days.  Oral antifungal medicines are rarely used during pregnancy because they may be harmful to the unborn child. If you are pregnant, talk with your health care provider about options for treatment. Persistent or recurrent thrush For cases of thrush that do not go away  or keep coming back:  Treatment may be needed twice as long as the symptoms last.  Treatment will include both oral and topical antifungal medicines.  People with a weakened immune system can take an antifungal medicine on a continuous basis to prevent thrush infections.  It is important to treat conditions that make a person more likely to get thrush, such as diabetes  or HIV. Follow these instructions at home: Medicines  Take over-the-counter and prescription medicines only as told by your health care provider.  Talk with your health care provider about an over-the-counter medicine called gentian violet, which kills bacteria and fungi. Relieving soreness and discomfort To help reduce the discomfort of thrush:  Drink cold liquids such as water or iced tea.  Try flavored ice treats or frozen juices.  Eat foods that are easy to swallow, such as gelatin, ice cream, or custard.  Try drinking from a straw if the patches in your mouth are painful.  General instructions  Eat plain, unflavored yogurt as directed by your health care provider. Check the label to make sure the yogurt contains live cultures. This yogurt can help healthy bacteria to grow in the mouth and can stop the growth of the fungus that causes thrush.  If you wear dentures, remove the dentures before going to bed, brush them vigorously, and soak them in a cleaning solution as directed by your health care provider.  Rinse your mouth with a warm salt-water mixture several times a day. To make a salt-water mixture, completely dissolve 1/2-1 tsp of salt in 1 cup of warm water. Contact a health care provider if:  Your symptoms are getting worse or are not improving within 7 days of starting treatment.  You have symptoms of a spreading infection, such as white patches on the skin outside of the mouth. This information is not intended to replace advice given to you by your health care provider. Make sure you discuss any questions you have with your health care provider. Document Released: 07/16/2004 Document Revised: 07/15/2016 Document Reviewed: 07/15/2016 Elsevier Interactive Patient Education  2017 [employer].

## 2017-12-29 NOTE — Progress Notes (Signed)
Subjective:    Patient ID: Haley White, female    DOB: July 02, 1956, 62 y.o.   MRN: [id number]  HPI 62 y.o. obese WF former smoker with history of HTN, DM2, depression/anxiety presents with repeated bronchitis/hoarseness. She has been seen 09/21/15, 05/21/16, 07/17/17, 07/22/17, 07/28/17, 08/15/17, 09/18/17 for laryngotracheitis and cough/shortness of breath. She has seen Dr. Donneta Romberg, last visit was 10/17/2017, she is on allergy shots for tree pollen, she has seen Dr. Lucia Gaskins, she has not seen pulmonary and has not had PFTs, and feels that it is due to mold exposure where she works. Normal CXR 07/22/2017 no COPD, negative TB skin test 10/2017, inadequate sputum culture 10/2017.   She is on symbicort BID with MDI, she uses rescue inhaler every other day, xyzal, singulair, and flonase.   She has a headache, fatigue, dizziness, hoarseness, some SOB, some wheezing. Has on and off low grade temp at home. She has also felt that she has lump in her throat, dysphagia.   Blood pressure 116/82, pulse 88, temperature (!) 97.3 F (36.3 C), resp. rate 18, height 5\' 3"  (1.6 m), weight 216 lb 6.4 oz (98.2 kg), last menstrual period 11/26/2011.  Medications Current Outpatient Medications on File Prior to Visit  Medication Sig  . albuterol (VENTOLIN HFA) 108 (90 Base) MCG/ACT inhaler Inhale 2 puffs into the lungs every 4 (four) hours as needed for wheezing or shortness of breath.  Marland Kitchen FLUoxetine (PROZAC) 20 MG capsule Take 1 capsule (20 mg total) by mouth daily. For depression  . fluticasone (FLONASE) 50 MCG/ACT nasal spray SHAKE LQ AND U 1 TO 2 SPRAYS IEN QD  . guaiFENesin-codeine 100-10 MG/5ML syrup Take 10 mLs by mouth 3 (three) times daily as needed for cough.  Marland Kitchen ibuprofen (ADVIL,MOTRIN) 200 MG tablet Take 600 mg by mouth every 6 (six) hours as needed for headache.  . levocetirizine (XYZAL) 5 MG tablet TK 1 T PO QD IN THE EVE  . methylphenidate 54 MG PO CR tablet Take 54 mg by mouth every morning.  .  montelukast (SINGULAIR) 10 MG tablet Take 1 tablet daily for allergies/Asthma  . promethazine-dextromethorphan (PROMETHAZINE-DM) 6.25-15 MG/5ML syrup Take 1 to 2 tsp enery 4 hours if needed for cough  . rosuvastatin (CRESTOR) 40 MG tablet Take 1/2 to 1 tablet daily or as directed for Cholesterol (Patient taking differently: Take 1 tablet by mouth daily)   No current facility-administered medications on file prior to visit.     Problem list She has Hypertension; Anxiety; T2_NIDDM; Obese; Medication management; Vitamin D deficiency; Hyperlipidemia; Suicide attempt by drug ingestion ([place]); Steroid-induced depression; Major depressive disorder, recurrent, severe without psychotic features ([place]); and Pseudogout on their problem list.   Review of Systems  Constitutional: Positive for fatigue. Negative for chills and fever.  HENT: Positive for postnasal drip, rhinorrhea and voice change. Negative for congestion, ear discharge, ear pain, facial swelling and sneezing.   Eyes: Negative.   Respiratory: Positive for cough, shortness of breath and wheezing. Negative for apnea, choking, chest tightness and stridor.   Cardiovascular: Negative.  Negative for chest pain.  Gastrointestinal: Negative.        Objective:   Physical Exam  Constitutional: She is oriented to person, place, and time. She appears well-developed and well-nourished.  HENT:  Head: Normocephalic and atraumatic.  Right Ear: External ear normal.  Left Ear: External ear normal.  Mouth/Throat: Oropharynx is clear and moist.  Post pharynx with erythema/scrapable white plaque  Eyes: Conjunctivae and EOM are normal. Pupils are equal,  round, and reactive to light.  Neck: Normal range of motion. Neck supple. No thyromegaly present.  Cardiovascular: Normal rate, regular rhythm and normal heart sounds. Exam reveals no gallop and no friction rub.  No murmur heard. Pulmonary/Chest: Effort normal and breath sounds normal. No respiratory  distress. She has no wheezes.  CTAB  Abdominal: Soft. Bowel sounds are normal. She exhibits no distension and no mass. There is no tenderness. There is no rebound and no guarding.  Musculoskeletal: Normal range of motion.  Lymphadenopathy:    She has no cervical adenopathy.  Neurological: She is alert and oriented to person, place, and time. She displays normal reflexes. No cranial nerve deficit. Coordination normal.  Skin: Skin is warm and dry.  Psychiatric: She has a normal mood and affect.       Assessment & Plan:  Diagnoses and all orders for this visit:  Bronchitis/Laryngotracheitis Repeated issues Has seen allergy and ENT, will refer to pulmonary for PFTs Check for immunodef -     Ambulatory referral to Pulmonology -     IgG, IgA, IgM -     HIV antibody  Yeast pharyngitis ? Contributing to Laryngotracheitis -     fluconazole (DIFLUCAN) 100 MG tablet; Take 1 tablet (100 mg total) by mouth daily for 14 days.  Future Appointments  Date Time Provider [place]  03/17/2018  4:30 PM Unk Pinto, MD [facility] None  11/09/2018 10:00 AM Unk Pinto, MD [facility] None

## 2017-12-30 LAB — IGG, IGA, IGM
IgG (Immunoglobin G), Serum: 878 mg/dL (ref 694–1618)
IgM, Serum: 109 mg/dL (ref 48–271)
Immunoglobulin A: 141 mg/dL (ref 81–463)

## 2017-12-30 LAB — HIV ANTIBODY (ROUTINE TESTING W REFLEX): HIV 1&2 Ab, 4th Generation: NONREACTIVE

## 2017-12-31 DIAGNOSIS — J301 Allergic rhinitis due to pollen: Secondary | ICD-10-CM | POA: Diagnosis not present

## 2018-01-02 DIAGNOSIS — J301 Allergic rhinitis due to pollen: Secondary | ICD-10-CM | POA: Diagnosis not present

## 2018-01-07 ENCOUNTER — Ambulatory Visit (HOSPITAL_COMMUNITY)
Admission: RE | Admit: 2018-01-07 | Discharge: 2018-01-07 | Disposition: A | Discharge status: Home / Self Care | Payer: BLUE CROSS/BLUE SHIELD | Source: Ambulatory Visit | Attending: Internal Medicine | Admitting: Internal Medicine

## 2018-01-07 ENCOUNTER — Ambulatory Visit (INDEPENDENT_AMBULATORY_CARE_PROVIDER_SITE_OTHER): Payer: BLUE CROSS/BLUE SHIELD | Admitting: Internal Medicine

## 2018-01-07 ENCOUNTER — Encounter: Payer: Self-pay | Admitting: Internal Medicine

## 2018-01-07 VITALS — BP 120/84 | HR 88 | Temp 97.2°F | Resp 18 | Ht 63.0 in | Wt 216.0 lb

## 2018-01-07 DIAGNOSIS — J042 Acute laryngotracheitis: Secondary | ICD-10-CM | POA: Insufficient documentation

## 2018-01-07 DIAGNOSIS — Z79899 Other long term (current) drug therapy: Secondary | ICD-10-CM

## 2018-01-07 DIAGNOSIS — R0602 Shortness of breath: Secondary | ICD-10-CM | POA: Diagnosis not present

## 2018-01-07 LAB — CBC WITH DIFFERENTIAL/PLATELET
BASOS PCT: 0.8 %
Basophils Absolute: 58 cells/uL (ref 0–200)
EOS ABS: 431 {cells}/uL (ref 15–500)
Eosinophils Relative: 5.9 %
HEMATOCRIT: 40.5 % (ref 35.0–45.0)
HEMOGLOBIN: 13.8 g/dL (ref 11.7–15.5)
LYMPHS ABS: 3154 {cells}/uL (ref 850–3900)
MCH: 31.4 pg (ref 27.0–33.0)
MCHC: 34.1 g/dL (ref 32.0–36.0)
MCV: 92 fL (ref 80.0–100.0)
MPV: 10.6 fL (ref 7.5–12.5)
Monocytes Relative: 8.5 %
NEUTROS ABS: 3037 {cells}/uL (ref 1500–7800)
Neutrophils Relative %: 41.6 %
Platelets: 321 10*3/uL (ref 140–400)
RBC: 4.4 10*6/uL (ref 3.80–5.10)
RDW: 11.8 % (ref 11.0–15.0)
TOTAL LYMPHOCYTE: 43.2 %
WBC: 7.3 10*3/uL (ref 3.8–10.8)
WBCMIX: 621 {cells}/uL (ref 200–950)

## 2018-01-07 LAB — BASIC METABOLIC PANEL WITH GFR
BUN: 13 mg/dL (ref 7–25)
CALCIUM: 9.7 mg/dL (ref 8.6–10.4)
CO2: 27 mmol/L (ref 20–32)
Chloride: 105 mmol/L (ref 98–110)
Creat: 0.86 mg/dL (ref 0.50–0.99)
GFR, EST AFRICAN AMERICAN: 85 mL/min/{1.73_m2} (ref >=60)
GFR, EST NON AFRICAN AMERICAN: 73 mL/min/{1.73_m2} (ref >=60)
Glucose, Bld: 108 mg/dL — ABNORMAL HIGH (ref 65–99)
Potassium: 4.5 mmol/L (ref 3.5–5.3)
Sodium: 141 mmol/L (ref 135–146)

## 2018-01-07 IMAGING — CR DG CHEST 2V
2 series · 2 of 2 positions shown · non-contrast
Comparison: [DATE]

CLINICAL DATA: Shortness of breath, chest heaviness, laryngitis

EXAM:
CHEST - 2 VIEW

[chest pa]
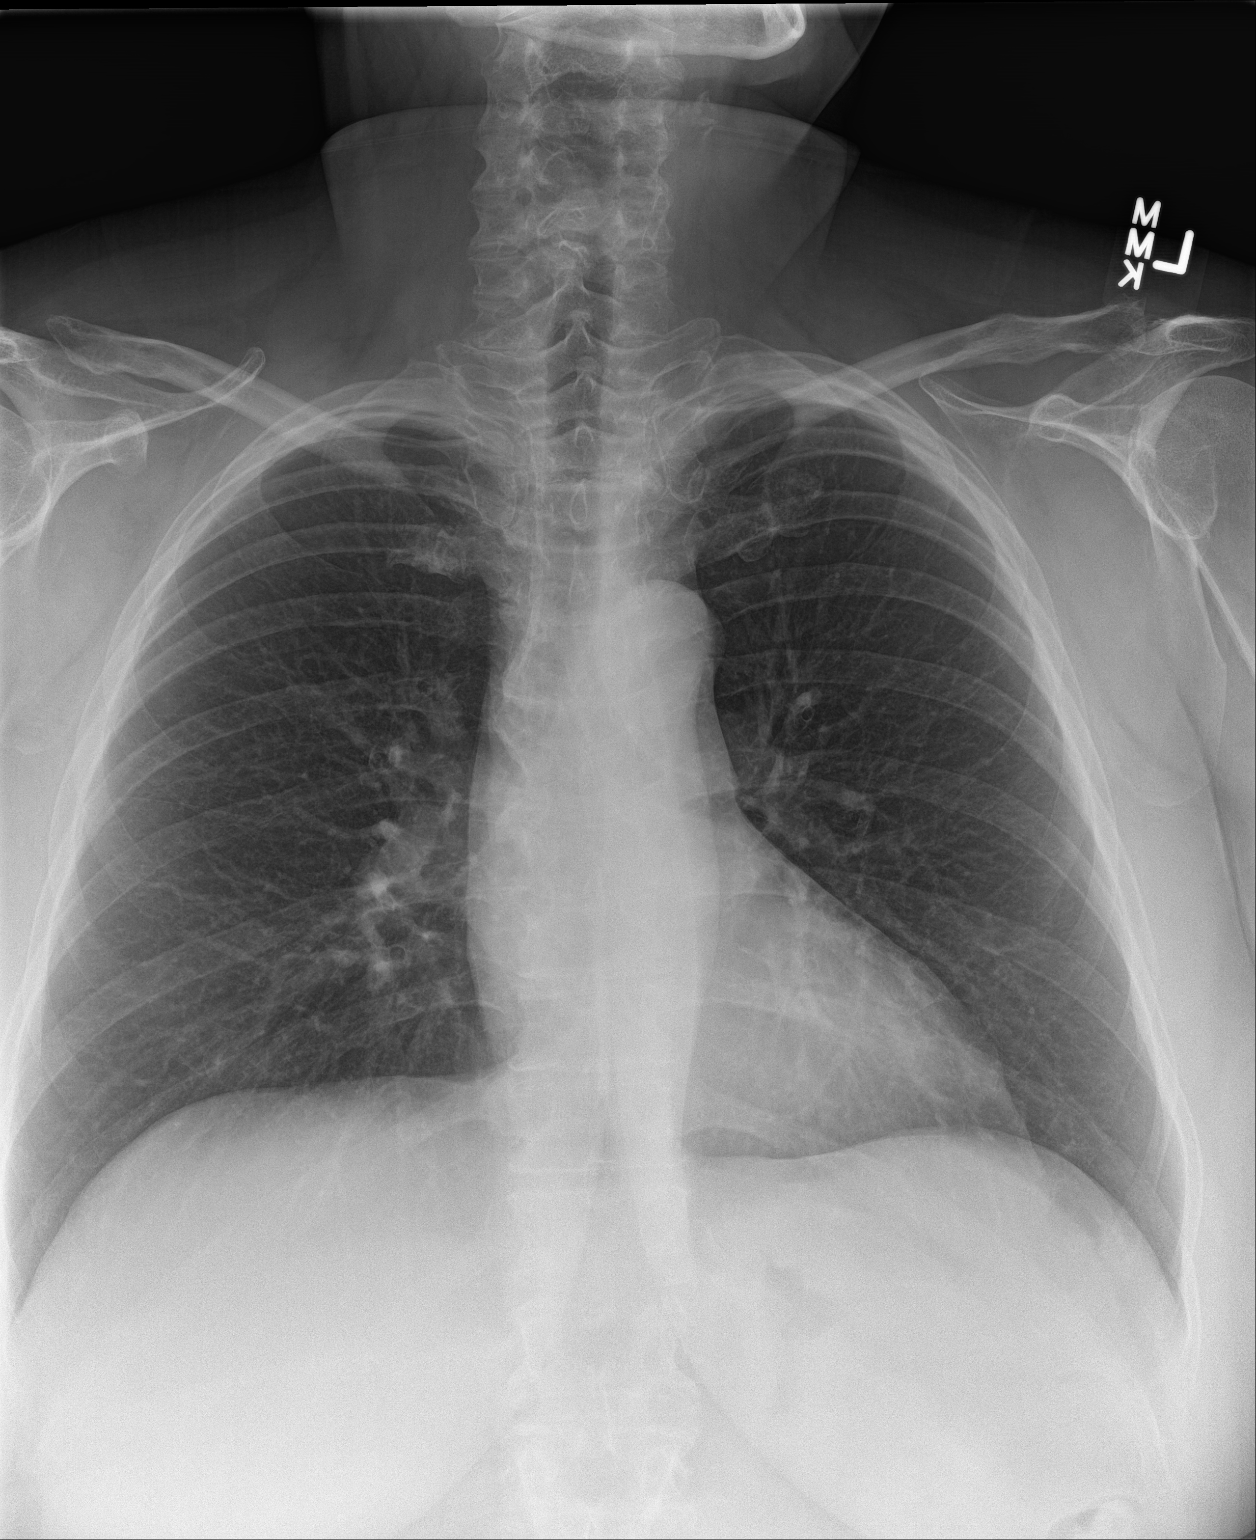

[chest lat]
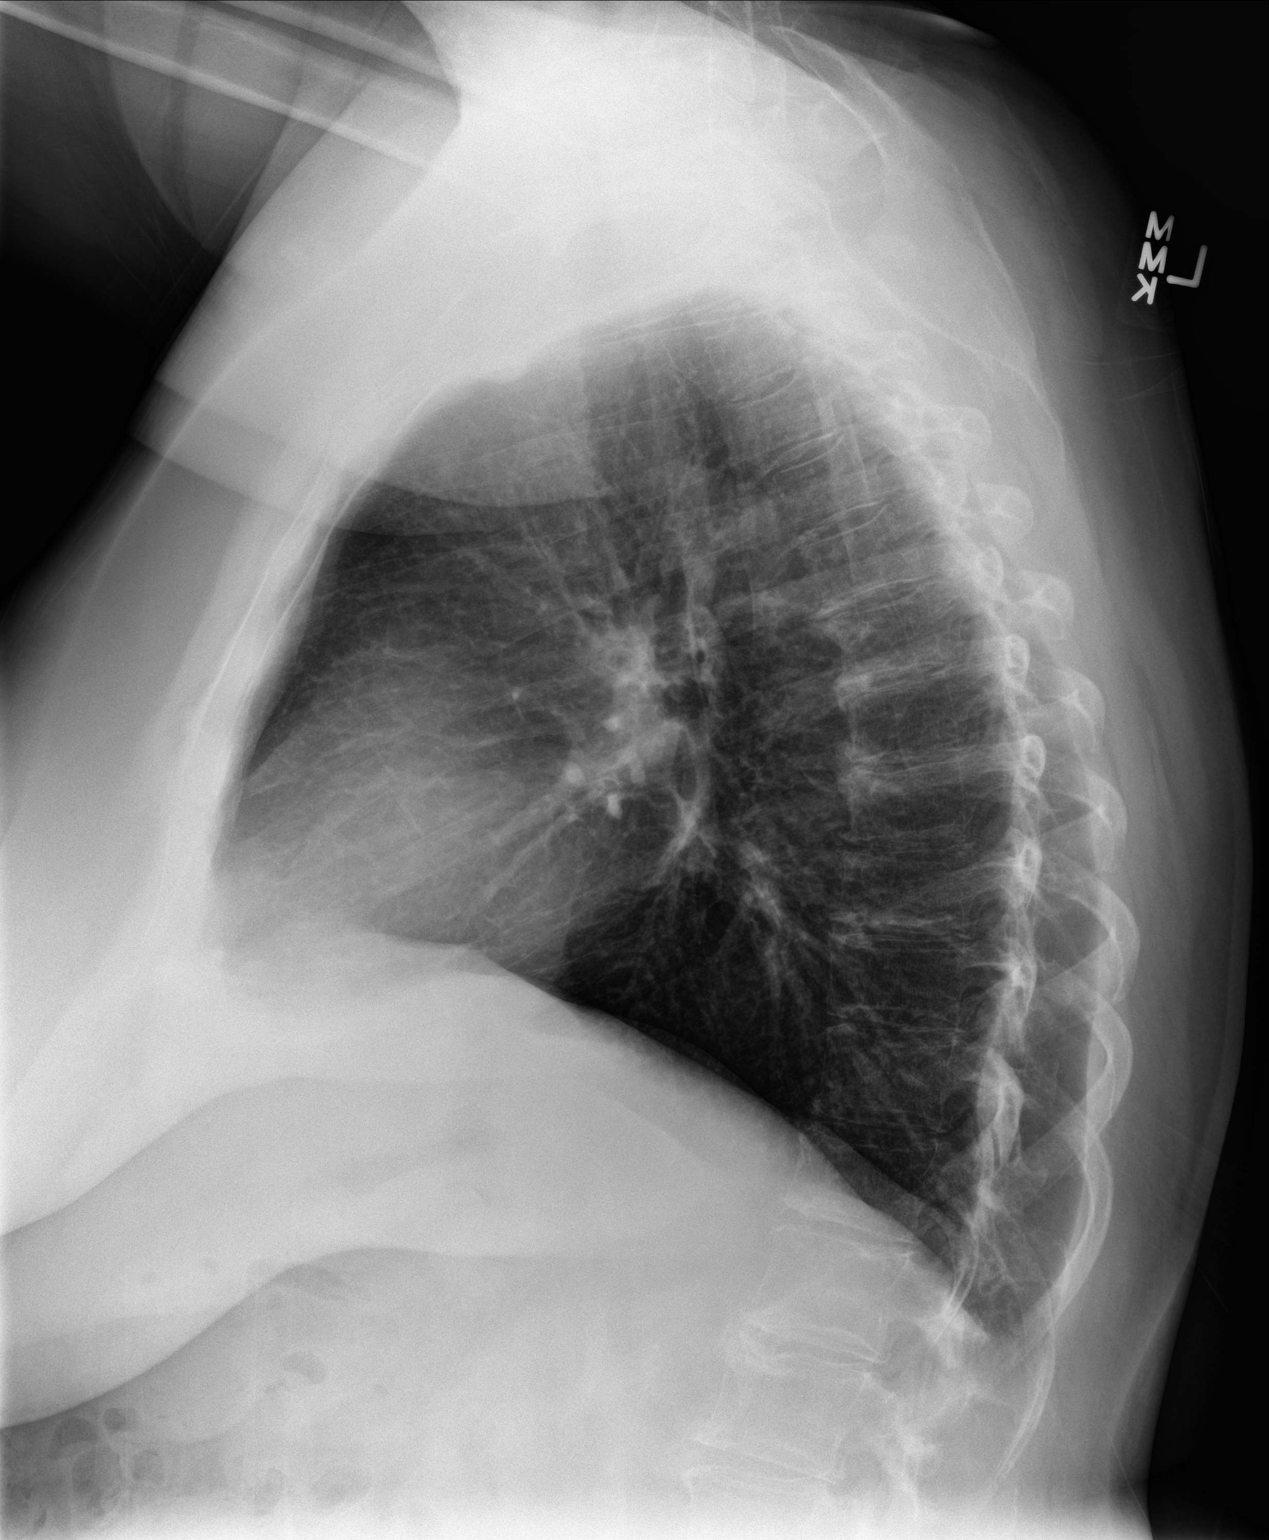

[2 of 2 positions shown; findings below may reference images not displayed]

FINDINGS: Heart and mediastinal contours are within normal limits. No focal
opacities or effusions. No acute bony abnormality.
IMPRESSION: No active cardiopulmonary disease.

## 2018-01-07 MED ORDER — DEXAMETHASONE 0.5 MG PO TABS: ORAL_TABLET | ORAL | 0 refills | Status: DC

## 2018-01-07 MED ORDER — AZITHROMYCIN 250 MG PO TABS
ORAL_TABLET | ORAL | 1 refills | Status: DC
Start: 2018-01-07 — End: 2018-01-27

## 2018-01-07 MED ORDER — PREDNISONE 20 MG PO TABS: ORAL_TABLET | ORAL | 0 refills | Status: DC

## 2018-01-07 NOTE — Patient Instructions (Signed)
Laryngitis Laryngitis is inflammation of your vocal cords. This causes hoarseness, coughing, loss of voice, sore throat, or a dry throat. Your vocal cords are two bands of muscles that are found in your throat. When you speak, these cords come together and vibrate. These vibrations come out through your mouth as sound. When your vocal cords are inflamed, your voice sounds different. Laryngitis can be temporary (acute) or long-term (chronic). Most cases of acute laryngitis improve with time. Chronic laryngitis is laryngitis that lasts for more than three weeks. What are the causes? Acute laryngitis may be caused by:  A viral infection.  Lots of talking, yelling, or singing. This is also called vocal strain.  Bacterial infections.  Chronic laryngitis may be caused by:  Vocal strain.  Injury to your vocal cords.  Acid reflux (gastroesophageal reflux disease or GERD).  Allergies.  Sinus infection.  Smoking.  Alcohol abuse.  Breathing in chemicals or dust.  Growths on the vocal cords.  What increases the risk? Risk factors for laryngitis include:  Smoking.  Alcohol abuse.  Having allergies.  What are the signs or symptoms? Symptoms of laryngitis may include:  Low, hoarse voice.  Loss of voice.  Dry cough.  Sore throat.  Stuffy nose.  How is this diagnosed? Laryngitis may be diagnosed by:  Physical exam.  Throat culture.  Blood test.  Laryngoscopy. This procedure allows your health care provider to look at your vocal cords with a mirror or viewing tube.  How is this treated? Treatment for laryngitis depends on what is causing it. Usually, treatment involves resting your voice and using medicines to soothe your throat. However, if your laryngitis is caused by a bacterial infection, you may need to take antibiotic medicine. If your laryngitis is caused by a growth, you may need to have a procedure to remove it. Follow these instructions at home:  Drink  enough fluid to keep your urine clear or pale yellow.  Breathe in moist air. Use a humidifier if you live in a dry climate.  Take medicines only as directed by your health care provider.  If you were prescribed an antibiotic medicine, finish it all even if you start to feel better.  Do not smoke cigarettes or electronic cigarettes. If you need help quitting, ask your health care provider.  Talk as little as possible. Also avoid whispering, which can cause vocal strain.  Write instead of talking. Do this until your voice is back to normal. Contact a health care provider if:  You have a fever.  You have increasing pain.  You have difficulty swallowing. Get help right away if:  You cough up blood.  You have trouble breathing. This information is not intended to replace advice given to you by your health care provider. Make sure you discuss any questions you have with your health care provider. Document Released: 10/21/2005 Document Revised: 03/28/2016 Document Reviewed: 04/05/2014 Elsevier Interactive Patient Education  2018 [employer]  

## 2018-01-07 NOTE — Progress Notes (Signed)
Subjective:    Patient ID: Haley White, female    DOB: Nov 29, 1955, 62 y.o.   MRN: [id number]  HPI  Patient presents with sx/o Laryngitis, hoarseness & headaches which she's relating to environmental allergens at work. She apparently has remote hx/o allergy sx's and has recently seen Dr Donneta Romberg again in Dec for Allergy testing. She currently reports sx's last 3-4 days with fever up to 100 deg and minimally productive cough.  Medication Sig  . albuterol (VENTOLIN HFA) 108 (90 Base) MCG/ACT inhaler Inhale 2 puffs into the lungs every 4 (four) hours as needed for wheezing or shortness of breath.  . fluconazole (DIFLUCAN) 100 MG tablet Take 1 tablet (100 mg total) by mouth daily for 14 days.  Marland Kitchen FLUoxetine (PROZAC) 20 MG capsule Take 1 capsule (20 mg total) by mouth daily. For depression  . fluticasone (FLONASE) 50 MCG/ACT nasal spray SHAKE LQ AND U 1 TO 2 SPRAYS IEN QD  . guaiFENesin-codeine 100-10 MG/5ML syrup Take 10 mLs by mouth 3 (three) times daily as needed for cough.  Marland Kitchen ibuprofen (ADVIL,MOTRIN) 200 MG tablet Take 600 mg by mouth every 6 (six) hours as needed for headache.  . levocetirizine (XYZAL) 5 MG tablet TK 1 T PO QD IN THE EVE  .  Take 54 mg by mouth every morning.  . montelukast (SINGULAIR) 10 MG tablet Take 1 tablet daily for allergies/Asthma  . promethazine-dextromethorphan (PROMETHAZINE-DM) 6.25-15 MG/5ML syrup Take 1 to 2 tsp enery 4 hours if needed for cough  . rosuvastatin (CRESTOR) 40 MG tablet Take 1/2 to 1 tablet daily or as directed for Cholesterol (Patient taking differently: Take 1 tablet by mouth daily)   Allergies  Allergen Reactions  . Biaxin [Clarithromycin]     GI upset  . Serevent [Salmeterol]     Palpitations  . Tequin [Gatifloxacin]     GI upset. thrush   Past Medical History:  Diagnosis Date  . Anxiety   . Arthritis   . Asthma   . Cataract   . Gout   . Hypertension   . Obese    Review of Systems  10 point systems review negative except as above.   Objective:   Physical Exam  BP 120/84   Pulse 88   Temp (!) 97.2 F (36.2 C)   Resp 18   Ht 5\' 3"  (1.6 m)   Wt 216 lb (98 kg)   LMP 11/26/2011   BMI 38.26 kg/m   In No respiratory distress or stridor. Dry cough. Very hoarse with voice laryngitic.   HEENT - WNL. Neck - supple.  Chest -  Few scattered rales , no wheezes or rhonchi.  Cor - Nl HS. RRR w/o sig MGR. PP 1(+). No edema. MS- FROM w/o deformities.  Gait Nl. Neuro -  Nl w/o focal abnormalities. Skin - No rash, cyanosis or icterus.    Assessment & Plan:   1. Laryngotracheitis - prob. Viral, but will cover w/ Z-pak to prevent secondary bacterial infection - DG Chest 2 View  - azithromycin (ZITHROMAX) 250 MG tablet; Take 2 tablets (500 mg) on  Day 1,  followed by 1 tablet (250 mg) once daily on Days 2 through 5.  Dispense: 6 each; Refill: 1 - CBC with Differential/Platelet - BASIC METABOLIC PANEL WITH GFR - dexamethasone (DECADRON) 0.5 MG tablet; Take 1 tab 3 x day - 3 days, then 2 x day - 3 days, then 1 tab daily  Dispense: 20 tablet; Refill: 0  2. Medication management  -  CBC with Differential/Platelet - BASIC METABOLIC PANEL WITH GFR

## 2018-01-14 DIAGNOSIS — J301 Allergic rhinitis due to pollen: Secondary | ICD-10-CM | POA: Diagnosis not present

## 2018-01-15 DIAGNOSIS — K219 Gastro-esophageal reflux disease without esophagitis: Secondary | ICD-10-CM | POA: Diagnosis not present

## 2018-01-15 DIAGNOSIS — J4530 Mild persistent asthma, uncomplicated: Secondary | ICD-10-CM | POA: Diagnosis not present

## 2018-01-15 DIAGNOSIS — J301 Allergic rhinitis due to pollen: Secondary | ICD-10-CM | POA: Diagnosis not present

## 2018-01-16 DIAGNOSIS — J301 Allergic rhinitis due to pollen: Secondary | ICD-10-CM | POA: Diagnosis not present

## 2018-01-27 ENCOUNTER — Encounter: Payer: Self-pay | Admitting: Internal Medicine

## 2018-01-27 ENCOUNTER — Other Ambulatory Visit (INDEPENDENT_AMBULATORY_CARE_PROVIDER_SITE_OTHER): Payer: BLUE CROSS/BLUE SHIELD

## 2018-01-27 ENCOUNTER — Ambulatory Visit (INDEPENDENT_AMBULATORY_CARE_PROVIDER_SITE_OTHER): Payer: BLUE CROSS/BLUE SHIELD | Admitting: Internal Medicine

## 2018-01-27 VITALS — BP 126/82 | HR 96 | Ht 63.0 in | Wt 216.0 lb

## 2018-01-27 DIAGNOSIS — R062 Wheezing: Secondary | ICD-10-CM | POA: Diagnosis not present

## 2018-01-27 DIAGNOSIS — Z77120 Contact with and (suspected) exposure to mold (toxic): Secondary | ICD-10-CM

## 2018-01-27 DIAGNOSIS — J387 Other diseases of larynx: Secondary | ICD-10-CM

## 2018-01-27 DIAGNOSIS — Z8709 Personal history of other diseases of the respiratory system: Secondary | ICD-10-CM

## 2018-01-27 DIAGNOSIS — R0600 Dyspnea, unspecified: Secondary | ICD-10-CM

## 2018-01-27 DIAGNOSIS — R05 Cough: Secondary | ICD-10-CM

## 2018-01-27 DIAGNOSIS — R053 Chronic cough: Secondary | ICD-10-CM

## 2018-01-27 DIAGNOSIS — R0689 Other abnormalities of breathing: Secondary | ICD-10-CM | POA: Diagnosis not present

## 2018-01-27 DIAGNOSIS — R059 Cough, unspecified: Secondary | ICD-10-CM

## 2018-01-27 LAB — CBC WITH DIFFERENTIAL/PLATELET
BASOS ABS: 0.1 10*3/uL (ref 0.0–0.1)
BASOS PCT: 1 % (ref 0.0–3.0)
Eosinophils Absolute: 0.3 10*3/uL (ref 0.0–0.7)
Eosinophils Relative: 4.4 % (ref 0.0–5.0)
HEMATOCRIT: 38.7 % (ref 36.0–46.0)
Hemoglobin: 13.2 g/dL (ref 12.0–15.0)
LYMPHS ABS: 2.6 10*3/uL (ref 0.7–4.0)
Lymphocytes Relative: 45.6 % (ref 12.0–46.0)
MCHC: 34.2 g/dL (ref 30.0–36.0)
MCV: 93.3 fl (ref 78.0–100.0)
MONOS PCT: 8.4 % (ref 3.0–12.0)
Monocytes Absolute: 0.5 10*3/uL (ref 0.1–1.0)
NEUTROS ABS: 2.3 10*3/uL (ref 1.4–7.7)
NEUTROS PCT: 40.6 % — AB (ref 43.0–77.0)
PLATELETS: 283 10*3/uL (ref 150.0–400.0)
RBC: 4.15 Mil/uL (ref 3.87–5.11)
RDW: 12.9 % (ref 11.5–15.5)
WBC: 5.7 10*3/uL (ref 4.0–10.5)

## 2018-01-27 LAB — NITRIC OXIDE: NITRIC OXIDE: 14

## 2018-01-27 NOTE — Patient Instructions (Addendum)
ICD-10-CM   1. Chronic cough R05   2. Wheezing R06.2   3. Irritable larynx syndrome J38.7   4. Dyspnea and respiratory abnormalities R06.00    R06.89   5. Mold suspected exposure Z77.120   6. History of asthma Z87.09    Check cbc with diff, IgE, blood allergy panel and hypersensitivity pneumonitis panel Check full PFT Check sinus CT Check HRCT chest Continue flonase, singulair, symbicortl, xyzal for now Choose 2 days for total voice rest while going through workup Suck on sugarless lozenge as needed while having urge to cough  FOllowup Next few weeks but after completing all of above; myself or an APP

## 2018-01-27 NOTE — Progress Notes (Signed)
   Subjective:    Patient ID: Haley White, female    DOB: [birth year], 62 y.o.   MRN: [id number]  HPI    Review of Systems  Constitutional: Positive for fever. Negative for unexpected weight change.  HENT: Positive for trouble swallowing. Negative for congestion, dental problem, ear pain, nosebleeds, postnasal drip, rhinorrhea, sinus pressure, sneezing and sore throat.   Eyes: Negative for redness and itching.  Respiratory: Positive for cough, shortness of breath and wheezing. Negative for chest tightness.   Cardiovascular: Negative for palpitations and leg swelling.  Gastrointestinal: Negative for nausea and vomiting.  Genitourinary: Negative for dysuria.  Musculoskeletal: Negative for joint swelling.  Skin: Negative for rash.  Allergic/Immunologic: Positive for environmental allergies. Negative for food allergies and immunocompromised state.  Neurological: Positive for headaches.  Hematological: Does not bruise/bleed easily.  Psychiatric/Behavioral: Positive for dysphoric mood. The patient is not nervous/anxious.        Objective:   Physical Exam        Assessment & Plan:

## 2018-01-27 NOTE — Progress Notes (Signed)
Subjective:     Patient ID: Haley White, female   DOB: 1955-11-16, 62 y.o.   MRN: [id number]  PCP Unk Pinto, MD   HPI   IOV 01/27/2018  Chief Complaint  Patient presents with  . Consult    Referred by Dr. Silverio Lay for bronchitis.  Pt states she has had bronchitis x5 months. States CO2 levels at her job are dangerously high and also potential mold.  C/o with yellow to green mucus, SOB. Denies any CP.   Haley White is a 62 year old overweight lady who is a Pharmacist, hospital at [employer].  She says that before 2003 she lived in this area and then moved to [place] where she thought she outgrew her asthma.  She returned back in 2005 to [place] and then was doing just fine and then in the fall 2018 when the school reopened she noticed that the air quality in the school building even though the building is only 61 years old was poor.  She feels a carbon dioxide levels are high and the suspected mold exposure.  Then while at school in September 2018 she was rushed to the emergency department.  I reviewed his records she was treated for asthma exacerbation.  But since then she is been dealing with cough, wheezing and shortness of breath.  Of all the symptoms cough is the most dominant symptom.  The severity of cough is reflected below in the cough scale.  The cough is associated with hoarseness of voice, change in voice and also clearing of the throat and a feeling of tickle in the throat.  She has seen ear nose throat physicians and was advised of the vocal cords might be inflamed.  Symptoms are worse during the weekdays.  Symptoms can happen at night but mostly happen in the daytime.  Weekends are better when she is off the school.  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  She has not tried voice rest of the symptoms.  She been on Symbicort now for a few months she does not think it is helping.  She is also on antihistamines and Singulair.  Exam  nitric oxide today while on Symbicort and Singulair is normal.  She is also on symptomatic cough syrup. Prior lab shows high eos    Dr Lorenza Cambridge Reflux Symptom Index (> 13-15 suggestive of LPR cough) 0 -> 5  =  none ->severe problem 01/27/2018   Hoarseness of problem with voice 5  Clearing  Of Throat 5  Excess throat mucus or feeling of post nasal drip 5  Difficulty swallowing food, liquid or tablets 2  Cough after eating or lying down 2  Breathing difficulties or choking episodes 1  Troublesome or annoying cough 5  Sensation of something sticking in throat or lump in throat 5  Heartburn, chest pain, indigestion, or stomach acid coming up 1  TOTAL 31    Feno 01/27/2018 - 14 ppb and norma on symbicort  CXR 01/07/18 - clear cxr. Not personally visualized  Results for Haley, White (MRN [id number]) as of 01/27/2018 10:06  Ref. Range 07/22/2017 09:50 10/08/2017 11:44 10/10/2017 11:47 12/29/2017 17:14 01/07/2018 10:24  Eosinophils Absolute Latest Ref Range: 15 - 500 cells/uL 0.4 330   431  Results for Haley, White (MRN [id number]) as of 01/27/2018 10:06  Ref. Range 07/22/2017 09:50 10/08/2017 11:44 10/10/2017 11:47 12/29/2017 17:14 01/07/2018 10:24  Eosinophil Latest Units: % 6 5.5   5.9  has a past medical history of Anxiety, Arthritis, Asthma, Cataract, Gout, Hypertension, and Obese.   reports that she quit smoking about 40 years ago. Her smoking use included cigarettes. She has a 7.00 pack-year smoking history. She has never used smokeless tobacco.  Past Surgical History:  Procedure Laterality Date  . APPENDECTOMY  1984  . CATARACT EXTRACTION W/ INTRAOCULAR LENS  IMPLANT, BILATERAL  2014  . [place]   X3  . FOOT SURGERY Left 2002  . KNEE SURGERY Left 1998  . ROTATOR CUFF REPAIR Right 2007  . TUBAL LIGATION  1994    Allergies  Allergen Reactions  . Biaxin [Clarithromycin]     GI upset  . Serevent [Salmeterol]     Palpitations  . Tequin [Gatifloxacin]     GI  upset. thrush    Immunization History  Administered Date(s) Administered  . Influenza,inj,Quad PF,6+ Mos 09/17/2017  . PPD Test 10/08/2017  . Pneumococcal Conjugate-13 11/05/1995  . Pneumococcal Polysaccharide-23 10/08/2017  . Tdap 11/04/2008    Family History  Problem Relation Age of Onset  . Dementia Father   . Anxiety disorder Sister   . Colon cancer Neg Hx      Current Outpatient Medications:  .  albuterol (VENTOLIN HFA) 108 (90 Base) MCG/ACT inhaler, Inhale 2 puffs into the lungs every 4 (four) hours as needed for wheezing or shortness of breath., Disp: 1 Inhaler, Rfl: 0 .  FLUoxetine (PROZAC) 20 MG capsule, Take 1 capsule (20 mg total) by mouth daily. For depression, Disp: 5 capsule, Rfl: 0 .  fluticasone (FLONASE) 50 MCG/ACT nasal spray, SHAKE LQ AND U 1 TO 2 SPRAYS IEN QD, Disp: , Rfl: 3 .  ibuprofen (ADVIL,MOTRIN) 200 MG tablet, Take 600 mg by mouth every 6 (six) hours as needed for headache., Disp: , Rfl:  .  levocetirizine (XYZAL) 5 MG tablet, TK 1 T PO QD IN THE EVE, Disp: , Rfl: 3 .  methylphenidate (RITALIN) 10 MG tablet, TK 1 T PO BID, Disp: , Rfl: 0 .  montelukast (SINGULAIR) 10 MG tablet, Take 1 tablet daily for allergies/Asthma, Disp: 90 tablet, Rfl: 3 .  rosuvastatin (CRESTOR) 40 MG tablet, Take 1/2 to 1 tablet daily or as directed for Cholesterol (Patient taking differently: Take 1 tablet by mouth daily), Disp: 30 tablet, Rfl: 5 .  SYMBICORT 160-4.5 MCG/ACT inhaler, INL 2 PFS PO BID, Disp: , Rfl: 6 .  guaiFENesin-codeine 100-10 MG/5ML syrup, Take 10 mLs by mouth 3 (three) times daily as needed for cough. (Patient not taking: Reported on 01/27/2018), Disp: 180 mL, Rfl: 0    Review of Systems     Objective:   Physical Exam  Constitutional: She is oriented to person, place, and time. She appears well-developed and well-nourished. No distress.  HENT:  Head: Normocephalic and atraumatic.  Right Ear: External ear normal.  Left Ear: External ear normal.   Mouth/Throat: Oropharynx is clear and moist. No oropharyngeal exudate.  Mild postnasal drip present Laryngeal quality to voice and cough Occasionally clears the throat in the exam room  Eyes: Pupils are equal, round, and reactive to light. Conjunctivae and EOM are normal. Right eye exhibits no discharge. Left eye exhibits no discharge. No scleral icterus.  Neck: Normal range of motion. Neck supple. No JVD present. No tracheal deviation present. No thyromegaly present.  Cardiovascular: Normal rate, regular rhythm, normal heart sounds and intact distal pulses. Exam reveals no gallop and no friction rub.  No murmur heard. Pulmonary/Chest: Effort normal  and breath sounds normal. No respiratory distress. She has no wheezes. She has no rales. She exhibits no tenderness.  Abdominal: Soft. Bowel sounds are normal. She exhibits no distension and no mass. There is no tenderness. There is no rebound and no guarding.  Musculoskeletal: Normal range of motion. She exhibits no edema or tenderness.  Lymphadenopathy:    She has no cervical adenopathy.  Neurological: She is alert and oriented to person, place, and time. She has normal reflexes. No cranial nerve deficit. She exhibits normal muscle tone. Coordination normal.  Skin: Skin is warm and dry. No rash noted. She is not diaphoretic. No erythema. No pallor.  Psychiatric: She has a normal mood and affect. Her behavior is normal. Judgment and thought content normal.  Vitals reviewed.     Vitals:   01/27/18 0954  BP: 126/82  Pulse: 96  SpO2: 94%  Weight: 216 lb (98 kg)  Height: 5\' 3"  (1.6 m)    Estimated body mass index is 38.26 kg/m as calculated from the following:   Height as of this encounter: 5\' 3"  (1.6 m).   Weight as of this encounter: 216 lb (98 kg).     Assessment:       ICD-10-CM   1. Chronic cough R05   2. Wheezing R06.2   3. Irritable larynx syndrome J38.7   4. Dyspnea and respiratory abnormalities R06.00    R06.89   5. Mold  suspected exposure Z77.120   6. History of asthma Z87.09        Plan:      Check cbc with diff, IgE, blood allergy panel and hypersensitivity pneumonitis panel Check full PFT Check sinus CT Check HRCT chest Continue flonase, singulair, symbicortl, xyzal for now Choose 2 days for total voice rest while going through workup Suck on sugarless lozenge as needed while having urge to cough  FOllowup Next few weeks but after completing all of above; myself or an APP    Dr. Brand Males, M.D., [facility].C.P Pulmonary and Critical Care Medicine Staff Physician, [place] Director - Interstitial Lung Disease  Program  P[facility] [place] at [place], [place], [address]  Pager: [phone], If no answer or between  15:00h - 7:00h: call [phone] Telephone: [phone]

## 2018-01-27 NOTE — Addendum Note (Signed)
Addended by: Lorretta Harp on: 01/27/2018 10:48 AM   Modules accepted: Orders

## 2018-01-29 DIAGNOSIS — J301 Allergic rhinitis due to pollen: Secondary | ICD-10-CM | POA: Diagnosis not present

## 2018-02-01 LAB — RESPIRATORY ALLERGY PROFILE REGION II ~~LOC~~
Allergen, Cedar tree, t12: <0.1 kU/L
Allergen, Cottonwood, t14: <0.1 kU/L
Allergen, D pternoyssinus,d7: <0.1 kU/L
Allergen, Mouse Urine Protein, e78: <0.1 kU/L
Allergen, Mulberry, t76: <0.1 kU/L
Allergen, Oak,t7: <0.1 kU/L
Allergen, P. notatum, m1: <0.1 kU/L
Aspergillus fumigatus, m3: <0.1 kU/L
Bermuda Grass: <0.1 kU/L
CLASS: 0
CLASS: 0
CLASS: 0
CLASS: 0
CLASS: 0
CLASS: 0
CLASS: 0
CLASS: 0
CLASS: 0
Cat Dander: <0.1 kU/L
Class: 0
Class: 0
Class: 0
Class: 0
Class: 0
Class: 0
Class: 0
Class: 0
Class: 0
Class: 0
Class: 0
Class: 0
Class: 0
Class: 0
Class: 0
Cockroach: <0.1 kU/L
Dog Dander: <0.1 kU/L
IGE (IMMUNOGLOBULIN E), SERUM: 11 kU/L (ref <=114)
Rough Pigweed  IgE: <0.1 kU/L
Sheep Sorrel IgE: <0.1 kU/L
Timothy Grass: <0.1 kU/L

## 2018-02-01 LAB — INTERPRETATION:

## 2018-02-01 LAB — HYPERSENSITIVITY PNUEMONITIS PROFILE
ASPERGILLUS FUMIGATUS: NEGATIVE
Faenia retivirgula: NEGATIVE
PIGEON SERUM: NEGATIVE
S. VIRIDIS: NEGATIVE
T. CANDIDUS: NEGATIVE
T. VULGARIS: NEGATIVE

## 2018-02-02 ENCOUNTER — Ambulatory Visit (INDEPENDENT_AMBULATORY_CARE_PROVIDER_SITE_OTHER)
Admission: RE | Admit: 2018-02-02 | Discharge: 2018-02-02 | Disposition: A | Discharge status: Home / Self Care | Payer: BLUE CROSS/BLUE SHIELD | Source: Ambulatory Visit | Attending: Internal Medicine | Admitting: Internal Medicine

## 2018-02-02 DIAGNOSIS — R05 Cough: Secondary | ICD-10-CM | POA: Diagnosis not present

## 2018-02-02 DIAGNOSIS — R059 Cough, unspecified: Secondary | ICD-10-CM

## 2018-02-02 IMAGING — CT CT CHEST HIGH RESOLUTION W/O CM
2 of 6 series · 15 of 36 positions shown, 18 images · non-contrast
Comparison: [DATE] chest radiograph.

CLINICAL DATA: Persistent productive cough for several months with
intermittent fever. Possible mold exposure.

EXAM:
CT CHEST WITHOUT CONTRAST
TECHNIQUE: Multidetector CT imaging of the chest was performed following the
standard protocol without intravenous contrast. High resolution
imaging of the lungs, as well as inspiratory and expiratory imaging,
was performed.

[Series 2: high resolution · axial · 0.70mm/px · z∈[-352,-96]mm · 12 of 144 slices shown, 15 images]
[im 8/144  mediastinal]
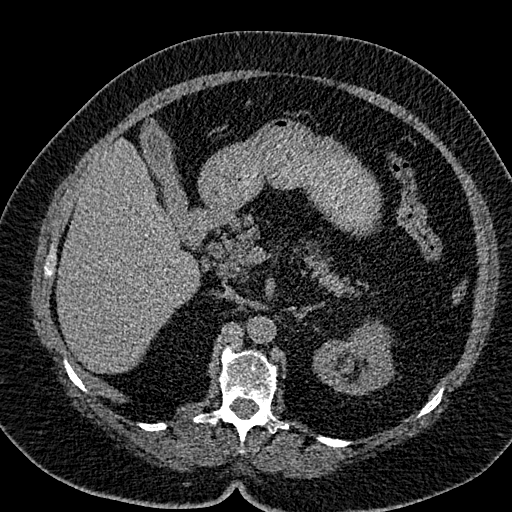
[im 8/144  lung]
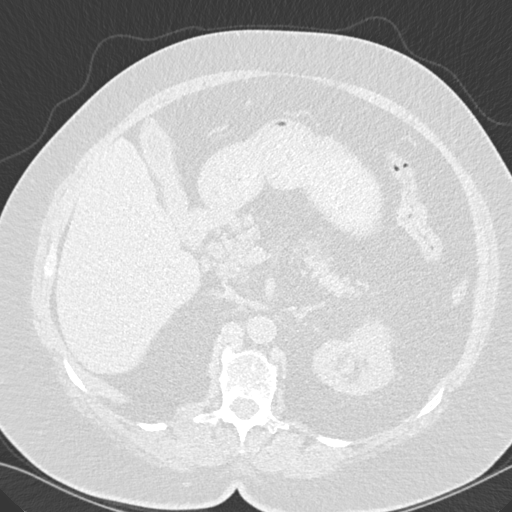
[im 23/144  lung]
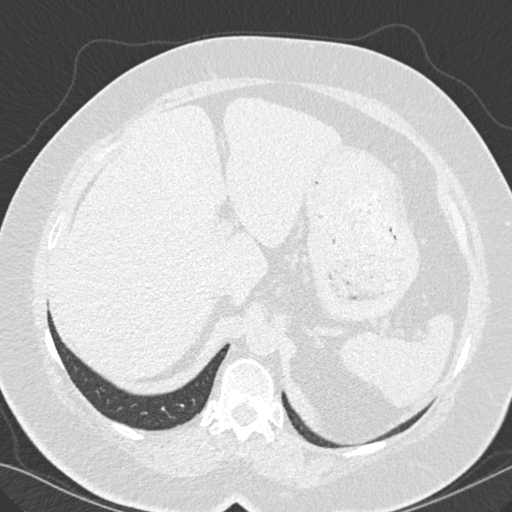
[im 31/144  lung]
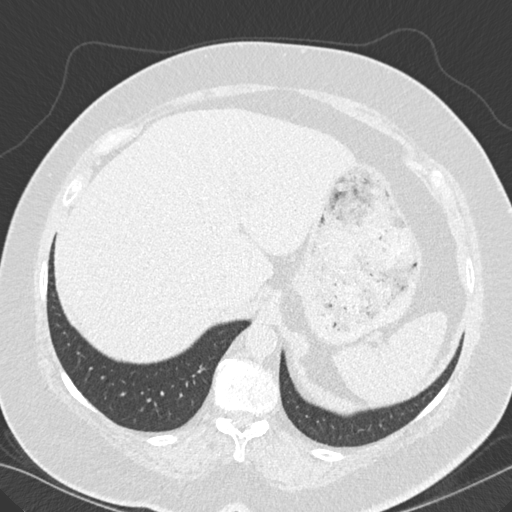
[im 46/144  lung]
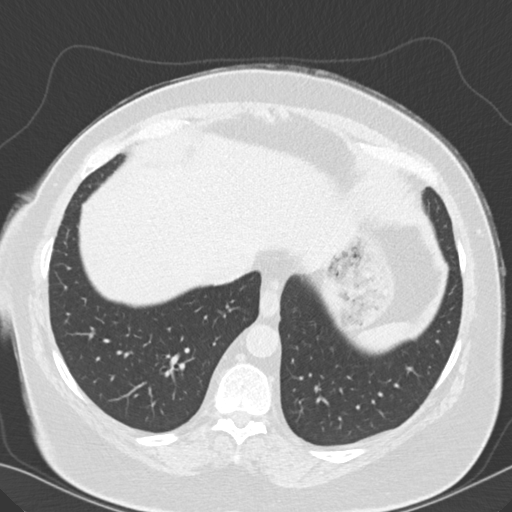
[im 53/144  mediastinal]
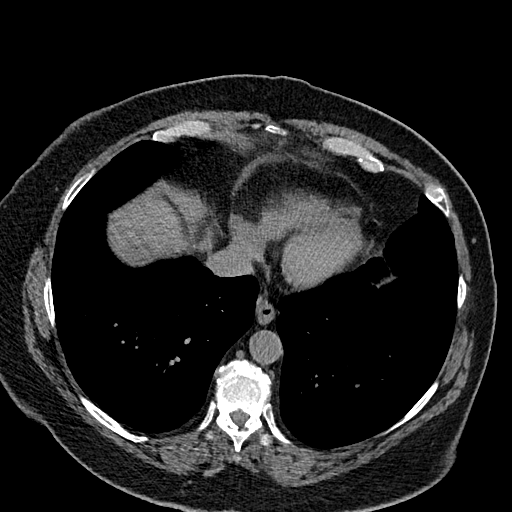
[im 53/144  lung]
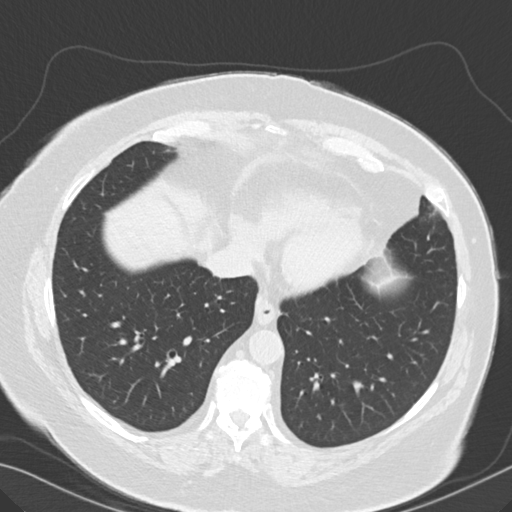
[im 68/144  lung]
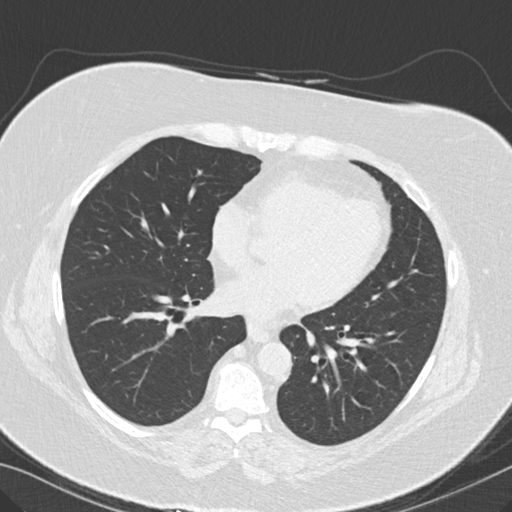
[im 76/144  lung]
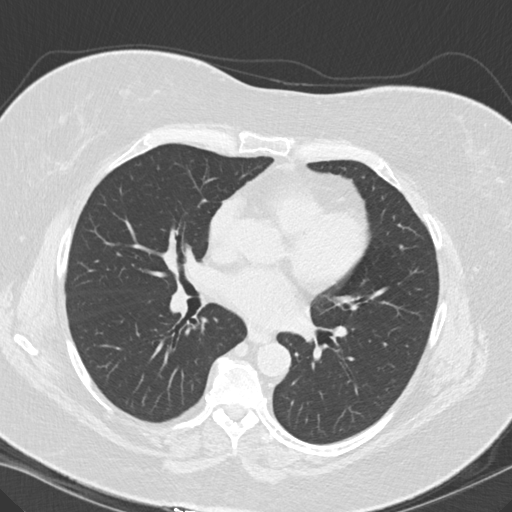
[im 91/144  lung]
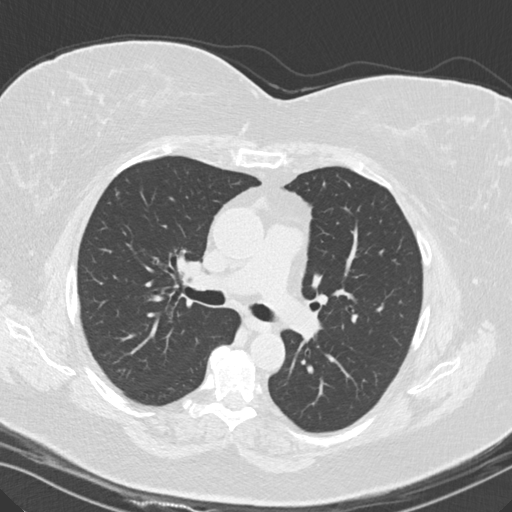
[im 98/144  mediastinal]
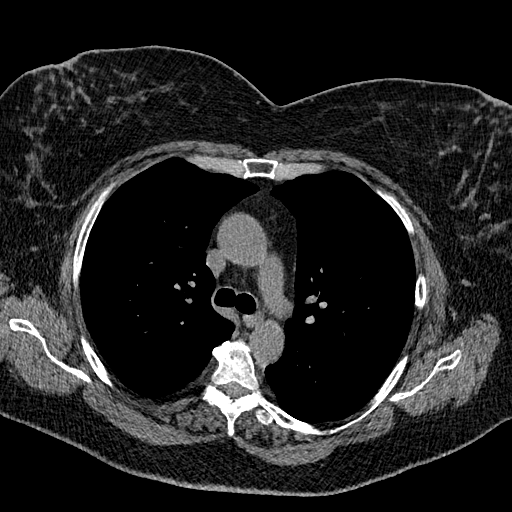
[im 98/144  lung]
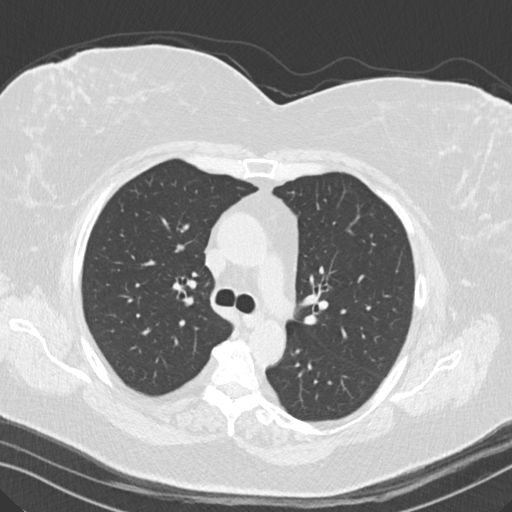
[im 113/144  lung]
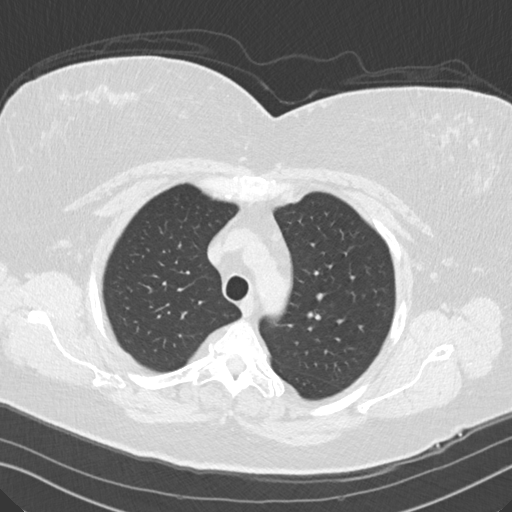
[im 121/144  lung]
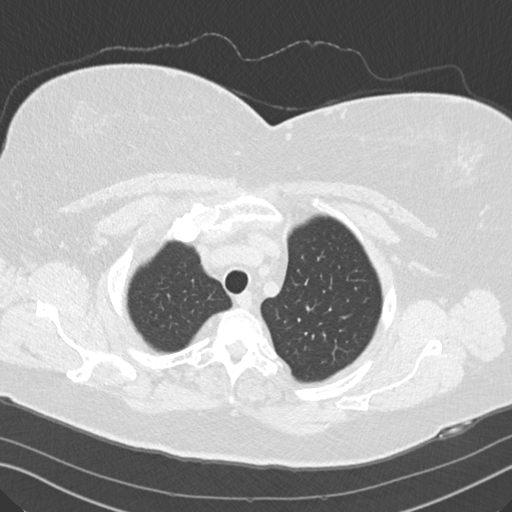
[im 136/144  lung]
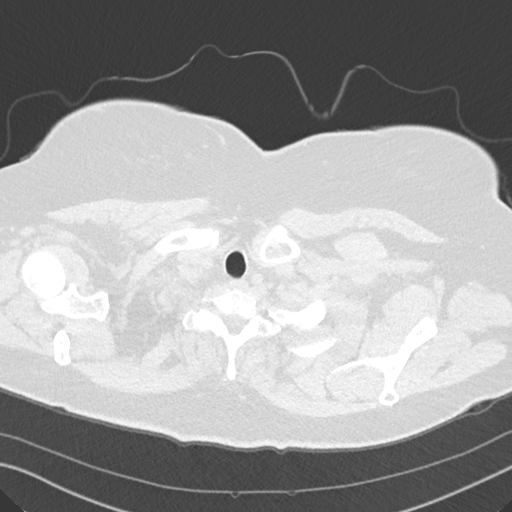

[Series 8: coronal · coronal · 0.59mm/px · 3 of 136 slices shown]
[im 28/136  lung]
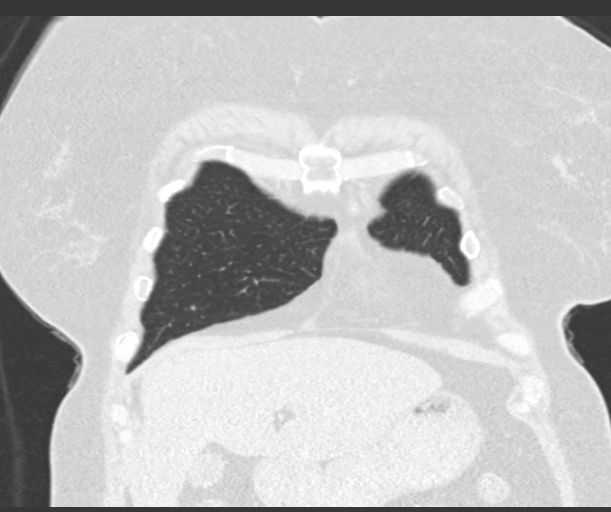
[im 55/136  lung]
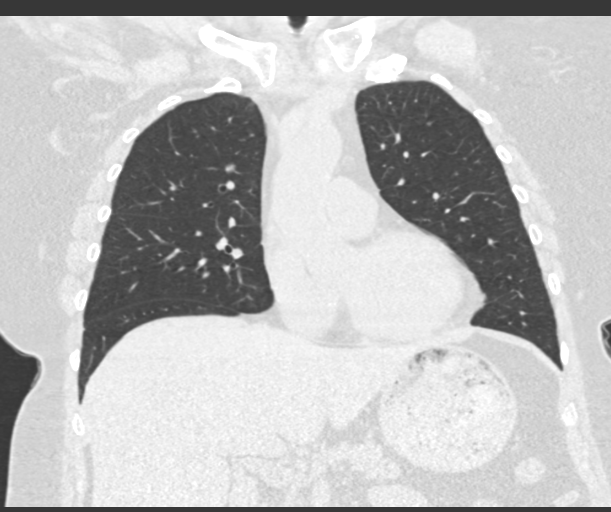
[im 82/136  lung]
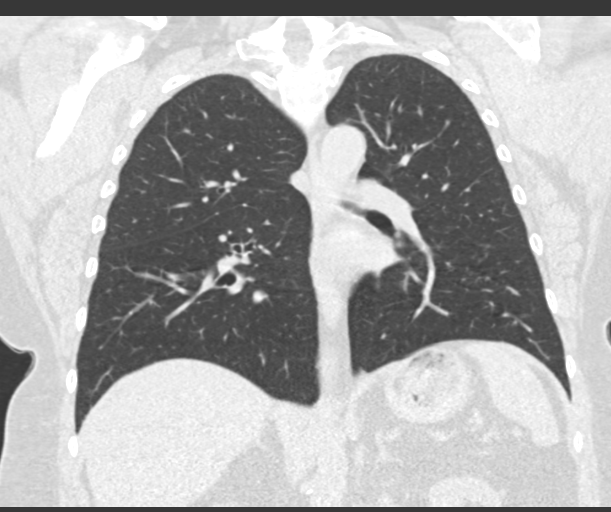

[15 of 36 positions shown; findings below may reference images not displayed]

FINDINGS: Cardiovascular: Normal heart size. No significant pericardial
fluid/thickening. Mildly atherosclerotic nonaneurysmal thoracic
aorta. Normal caliber pulmonary arteries.

Mediastinum/Nodes: No discrete thyroid nodules. Unremarkable
esophagus. No pathologically enlarged axillary, mediastinal or gross
hilar lymph nodes, noting limited sensitivity for the detection of
hilar adenopathy on this noncontrast study.

Lungs/Pleura: No pneumothorax. No pleural effusion. No acute
consolidative airspace disease, lung masses or significant pulmonary
nodules. No significant air trapping on the expiration sequence. No
significant regions of subpleural reticulation, ground-glass
attenuation, traction bronchiectasis, parenchymal banding,
architectural distortion or frank honeycombing.

Upper abdomen: No acute abnormality.

Musculoskeletal: No aggressive appearing focal osseous lesions.
Moderate thoracic spondylosis.
IMPRESSION: No evidence of interstitial lung disease. No active pulmonary
disease.

Aortic Atherosclerosis ([TV]-[TV]).

## 2018-02-02 IMAGING — CT CT PARANASAL SINUSES LIMITED
1 of 2 series · 8 of 11 positions shown, 10 images · non-contrast
Comparison: None.

CLINICAL DATA: Recurrent productive cough with green and yellow
sputum. Intermittent fever.

EXAM:
CT PARANASAL SINUS LIMITED WITHOUT CONTRAST
TECHNIQUE: Non-contiguous multidetector CT images of the paranasal sinuses were
obtained in a single plane without contrast.

[Series 4: limited sinus st · axial · 0.28mm/px · z∈[+120,+190]mm · 8 of 10 slices shown, 10 images]
[im 2/10  brain]
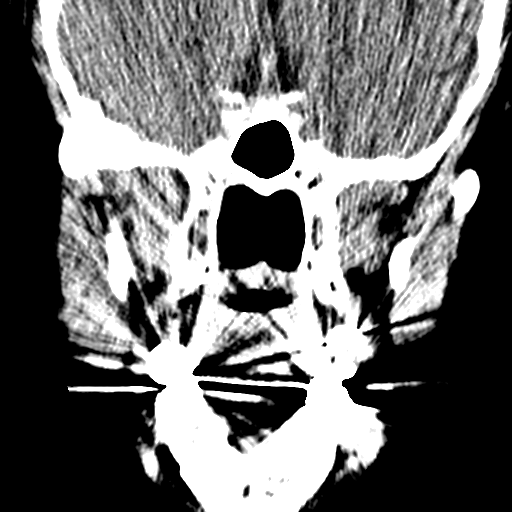
[im 2/10  bone]
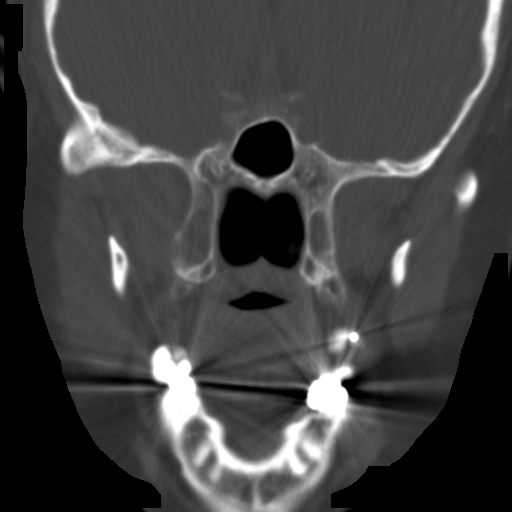
[im 3/10  bone]
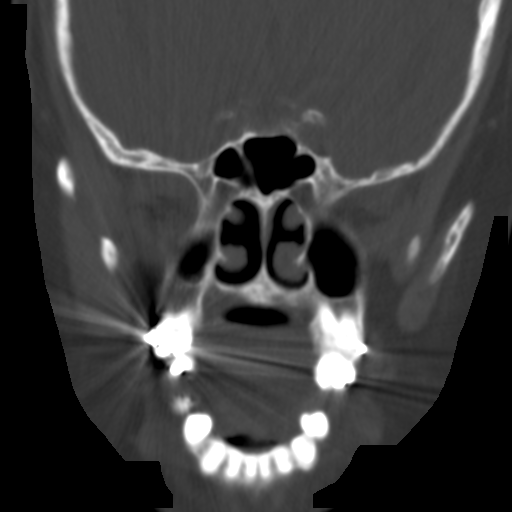
[im 4/10  bone]
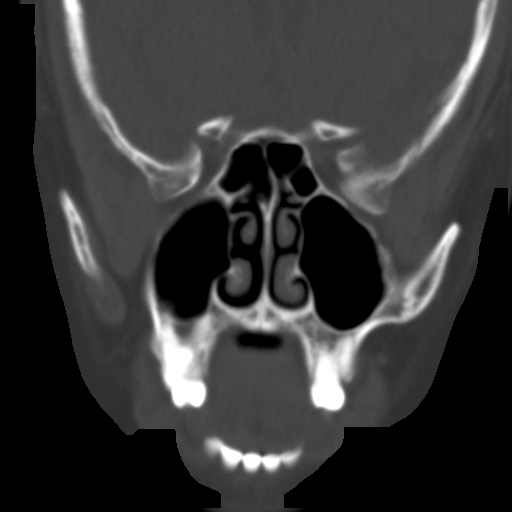
[im 5/10  bone]
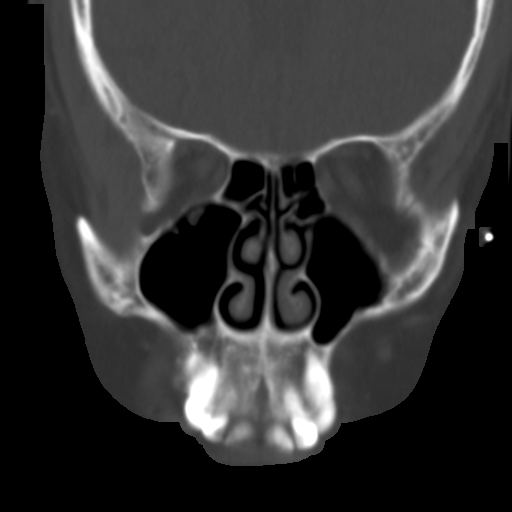
[im 6/10  brain]
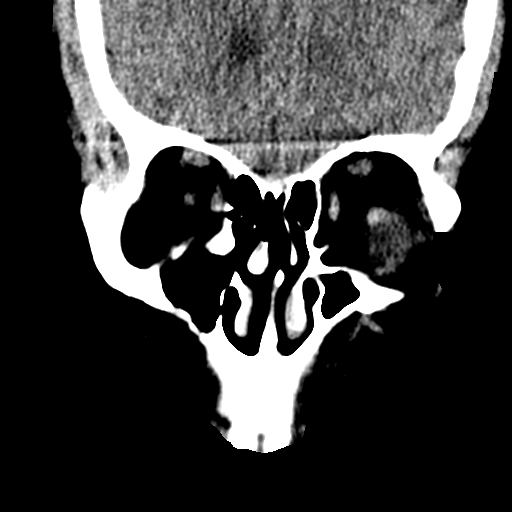
[im 6/10  bone]
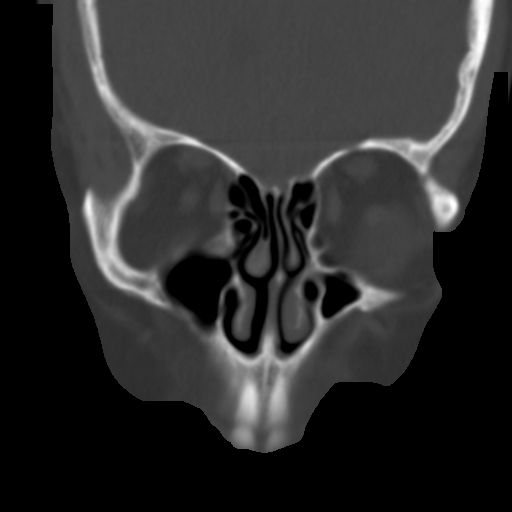
[im 7/10  bone]
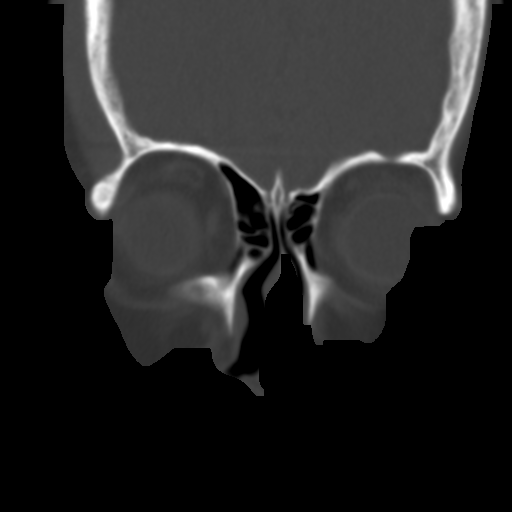
[im 8/10  bone]
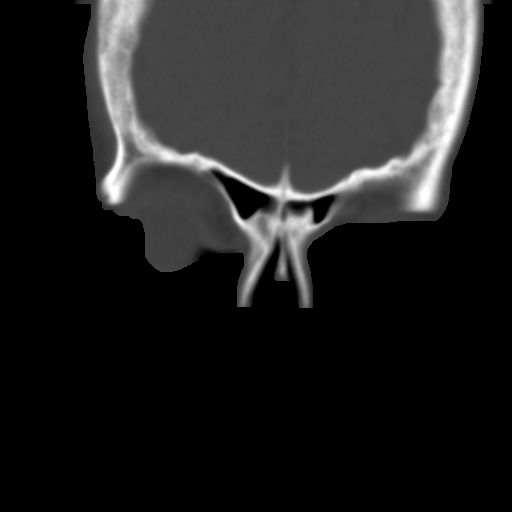
[im 9/10  bone]
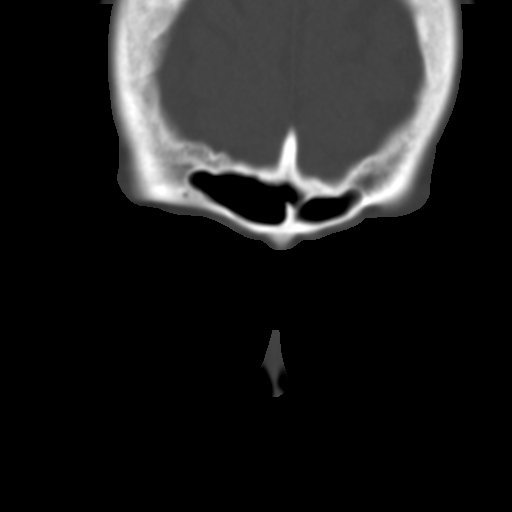

[8 of 11 positions shown; findings below may reference images not displayed]

FINDINGS: The frontal, ethmoid, maxillary, and sphenoid sinuses are clear.
Nasal septal deviation LEFT-to-RIGHT of 2 mm. Visualized soft
tissues appear unremarkable.
IMPRESSION: No significant paranasal sinus disease. Mild nasal septal deviation
LEFT-to-RIGHT.

## 2018-02-05 DIAGNOSIS — J301 Allergic rhinitis due to pollen: Secondary | ICD-10-CM | POA: Diagnosis not present

## 2018-02-09 DIAGNOSIS — J301 Allergic rhinitis due to pollen: Secondary | ICD-10-CM | POA: Diagnosis not present

## 2018-02-11 DIAGNOSIS — J301 Allergic rhinitis due to pollen: Secondary | ICD-10-CM | POA: Diagnosis not present

## 2018-02-13 ENCOUNTER — Telehealth: Payer: Self-pay | Admitting: Internal Medicine

## 2018-02-13 NOTE — Telephone Encounter (Signed)
Let [name] know that  Blood allery panel, CT sinus and CT chest - all normal    Dr. Brand Males, M.D., [facility].C.P Pulmonary and Critical Care Medicine Staff Physician, [place] Director - Interstitial Lung Disease  Program  Pulmonary [place] at [place], [place], [address]  Pager: [phone], If no answer or between  15:00h - 7:00h: call [phone] Telephone: [phone]

## 2018-02-16 ENCOUNTER — Telehealth: Payer: Self-pay | Admitting: Internal Medicine

## 2018-02-16 NOTE — Telephone Encounter (Signed)
Let [name] know that  Blood allery panel, CT sinus and CT chest - all normal  Spoke with patient. She is aware of results. Nothing else needed at time of call.

## 2018-02-16 NOTE — Telephone Encounter (Signed)
Attempted to call pt but no answer.  Left message for pt to return call x1 

## 2018-02-16 NOTE — Telephone Encounter (Signed)
See phone note from 02/16/18

## 2018-02-18 DIAGNOSIS — J301 Allergic rhinitis due to pollen: Secondary | ICD-10-CM | POA: Diagnosis not present

## 2018-03-02 DIAGNOSIS — J301 Allergic rhinitis due to pollen: Secondary | ICD-10-CM | POA: Diagnosis not present

## 2018-03-09 DIAGNOSIS — J301 Allergic rhinitis due to pollen: Secondary | ICD-10-CM | POA: Diagnosis not present

## 2018-03-11 ENCOUNTER — Ambulatory Visit: Payer: Self-pay | Admitting: Internal Medicine

## 2018-03-11 DIAGNOSIS — J301 Allergic rhinitis due to pollen: Secondary | ICD-10-CM | POA: Diagnosis not present

## 2018-03-11 DIAGNOSIS — J3089 Other allergic rhinitis: Secondary | ICD-10-CM | POA: Diagnosis not present

## 2018-03-13 DIAGNOSIS — J301 Allergic rhinitis due to pollen: Secondary | ICD-10-CM | POA: Diagnosis not present

## 2018-03-13 DIAGNOSIS — J3089 Other allergic rhinitis: Secondary | ICD-10-CM | POA: Diagnosis not present

## 2018-03-17 ENCOUNTER — Ambulatory Visit: Payer: Self-pay | Admitting: Internal Medicine

## 2018-03-18 DIAGNOSIS — J301 Allergic rhinitis due to pollen: Secondary | ICD-10-CM | POA: Diagnosis not present

## 2018-03-25 ENCOUNTER — Telehealth: Payer: Self-pay | Admitting: *Deleted

## 2018-03-25 MED ORDER — AZITHROMYCIN 250 MG PO TABS: ORAL_TABLET | ORAL | 0 refills | Status: AC

## 2018-03-25 NOTE — Telephone Encounter (Signed)
Patient called and complained of sinus drainage and pressure, chest congestion, productive cough and wheezing.  Per Dr Melford Aase, a Zpak has been sent to the patient's pharmacy and was advised,if she is not better by 03/31/2018, call for an office visit.

## 2018-03-27 DIAGNOSIS — J4530 Mild persistent asthma, uncomplicated: Secondary | ICD-10-CM | POA: Diagnosis not present

## 2018-03-27 DIAGNOSIS — J209 Acute bronchitis, unspecified: Secondary | ICD-10-CM | POA: Diagnosis not present

## 2018-03-27 DIAGNOSIS — J019 Acute sinusitis, unspecified: Secondary | ICD-10-CM | POA: Diagnosis not present

## 2018-03-27 DIAGNOSIS — J301 Allergic rhinitis due to pollen: Secondary | ICD-10-CM | POA: Diagnosis not present

## 2018-04-07 DIAGNOSIS — J301 Allergic rhinitis due to pollen: Secondary | ICD-10-CM | POA: Diagnosis not present

## 2018-04-09 ENCOUNTER — Ambulatory Visit: Payer: Self-pay | Admitting: Internal Medicine

## 2018-04-20 ENCOUNTER — Ambulatory Visit (INDEPENDENT_AMBULATORY_CARE_PROVIDER_SITE_OTHER): Payer: BLUE CROSS/BLUE SHIELD | Admitting: Internal Medicine

## 2018-04-20 ENCOUNTER — Encounter: Payer: Self-pay | Admitting: Internal Medicine

## 2018-04-20 VITALS — BP 138/84 | HR 84 | Temp 97.9°F | Resp 14 | Ht 63.0 in | Wt 216.2 lb

## 2018-04-20 DIAGNOSIS — Z79899 Other long term (current) drug therapy: Secondary | ICD-10-CM

## 2018-04-20 DIAGNOSIS — J309 Allergic rhinitis, unspecified: Secondary | ICD-10-CM | POA: Diagnosis not present

## 2018-04-20 DIAGNOSIS — E559 Vitamin D deficiency, unspecified: Secondary | ICD-10-CM | POA: Diagnosis not present

## 2018-04-20 DIAGNOSIS — R0989 Other specified symptoms and signs involving the circulatory and respiratory systems: Secondary | ICD-10-CM | POA: Diagnosis not present

## 2018-04-20 DIAGNOSIS — E782 Mixed hyperlipidemia: Secondary | ICD-10-CM | POA: Diagnosis not present

## 2018-04-20 DIAGNOSIS — R7303 Prediabetes: Secondary | ICD-10-CM | POA: Diagnosis not present

## 2018-04-20 NOTE — Progress Notes (Signed)
This very nice 62 y.o. Haley White presents for 6 month follow up with HTN, HLD, Pre-Diabetes and Vitamin D Deficiency.  Patient is still seeing Haley White for cough and allergy sx's. .      Patient is treated for HTN (2008) & BP has been controlled at home. Today's BP was sl elevated & rechecked at goal - 136/88. Patient has had no complaints of any cardiac type chest pain, palpitations, dyspnea / orthopnea / PND, dizziness, claudication, or dependent edema.     Hyperlipidemia is not controlled with diet & meds. Patient denies myalgias or other med SE's. Last Lipids were not at goal: Lab Results  Component Value Date   CHOL 300 (H) 10/08/2017   HDL 75 10/08/2017   LDLCALC 191 (H) 10/08/2017   TRIG 169 (H) 10/08/2017   CHOLHDL 4.0 10/08/2017      Also, the patient has history of  Morbid Obesity (BMI 38+) and PreDiabetes (5.9% / 2014)  and has had no symptoms of reactive hypoglycemia, diabetic polys, paresthesias or visual blurring.  Last A1c was Normal & at goal: Lab Results  Component Value Date   HGBA1C 5.5 10/08/2017      Further, the patient also has history of Vitamin D Deficiency ("20"/2010) and supplements vitamin D without any suspected side-effects. Last vitamin D was still  sl low (goal 70-100):  Lab Results  Component Value Date   VD25OH 57 10/08/2017   Current Outpatient Medications on File Prior to Visit  Medication Sig  . albuterol (VENTOLIN HFA) 108 (90 Base) MCG/ACT inhaler Inhale 2 puffs into the lungs every 4 (four) hours as needed for wheezing or shortness of breath.  Marland Kitchen FLUoxetine (PROZAC) 20 MG capsule Take 1 capsule (20 mg total) by mouth daily. For depression  . fluticasone (FLONASE) 50 MCG/ACT nasal spray SHAKE LQ AND U 1 TO 2 SPRAYS IEN QD  . guaiFENesin-codeine 100-10 MG/5ML syrup Take 10 mLs by mouth 3 (three) times daily as needed for cough.  Marland Kitchen ibuprofen (ADVIL,MOTRIN) 200 MG tablet Take 600 mg by mouth every 6 (six) hours as needed for headache.  .  levocetirizine (XYZAL) 5 MG tablet TK 1 T PO QD IN THE EVE  . methylphenidate (RITALIN) 10 MG tablet TK 1 T PO BID  . montelukast (SINGULAIR) 10 MG tablet Take 1 tablet daily for allergies/Asthma  . rosuvastatin (CRESTOR) 40 MG tablet Take 1/2 to 1 tablet daily or as directed for Cholesterol (Patient taking differently: Take 1 tablet by mouth daily)  . SYMBICORT 160-4.5 MCG/ACT inhaler INL 2 PFS PO BID   No current facility-administered medications on file prior to visit.    Allergies  Allergen Reactions  . Biaxin [Clarithromycin]     GI upset  . Serevent [Salmeterol]     Palpitations  . Tequin [Gatifloxacin]     GI upset. thrush   PMHx:   Past Medical History:  Diagnosis Date  . Anxiety   . Arthritis   . Asthma   . Cataract   . Gout   . Hypertension   . Obese    Immunization History  Administered Date(s) Administered  . Influenza,inj,Quad PF,6+ Mos 09/17/2017  . PPD Test 10/08/2017  . Pneumococcal Conjugate-13 11/05/1995  . Pneumococcal Polysaccharide-23 10/08/2017  . Tdap 11/04/2008   Past Surgical History:  Procedure Laterality Date  . APPENDECTOMY  1984  . CATARACT EXTRACTION W/ INTRAOCULAR LENS  IMPLANT, BILATERAL  2014  . [place]   X3  .  FOOT SURGERY Left 2002  . KNEE SURGERY Left 1998  . ROTATOR CUFF REPAIR Right 2007  . TUBAL LIGATION  1994   FHx:    Reviewed / unchanged  SHx:    Reviewed / unchanged   Systems Review:  Constitutional: Denies fever, chills, wt changes, headaches, insomnia, fatigue, night sweats, change in appetite. Eyes: Denies redness, blurred vision, diplopia, discharge, itchy, watery eyes.  ENT: Denies discharge, congestion, post nasal drip, epistaxis, sore throat, earache, hearing loss, dental pain, tinnitus, vertigo, sinus pain, snoring.  CV: Denies chest pain, palpitations, irregular heartbeat, syncope, dyspnea, diaphoresis, orthopnea, PND, claudication or edema. Respiratory: denies  dyspnea, DOE,  pleurisy, hoarseness, laryngitis, wheezing. Has dry nonproductive cough. Gastrointestinal: Denies dysphagia, odynophagia, heartburn, reflux, water brash, abdominal pain or cramps, nausea, vomiting, bloating, diarrhea, constipation, hematemesis, melena, hematochezia  or hemorrhoids. Genitourinary: Denies dysuria, frequency, urgency, nocturia, hesitancy, discharge, hematuria or flank pain. Musculoskeletal: Denies arthralgias, myalgias, stiffness, jt. swelling, pain, limping or strain/sprain.  Skin: Denies pruritus, rash, hives, warts, acne, eczema or change in skin lesion(s). Neuro: No weakness, tremor, incoordination, spasms, paresthesia or pain. Psychiatric: Denies confusion, memory loss or sensory loss. Endo: Denies change in weight, skin or hair change.  Heme/Lymph: No excessive bleeding, bruising or enlarged lymph nodes.  Physical Exam  BP 136/88   Pulse 84   Temp 97.9 F (36.6 C)   Resp 14   Ht 5\' 3"  (1.6 m)   Wt 216 lb 3.2 oz (98.1 kg)   LMP 11/26/2011   SpO2 96%   BMI 38.30 kg/m   Appears  well nourished, well groomed  and in no distress.  Eyes: PERRLA, EOMs, conjunctiva no swelling or erythema. Sinuses: No frontal/maxillary tenderness ENT/Mouth: EAC's clear, TM's nl w/o erythema, bulging. Nares clear w/o erythema, swelling, exudates. Oropharynx clear without erythema or exudates. Oral hygiene is good. Tongue normal, non obstructing. Hearing intact.  Neck: Supple. Thyroid not palpable. Car 2+/2+ without bruits, nodes or JVD. Chest: Respirations nl with BS clear & equal w/o rales, rhonchi, wheezing or stridor.  Cor: Heart sounds normal w/ regular rate and rhythm without sig. murmurs, gallops, clicks or rubs. Peripheral pulses normal and equal  without edema.  Abdomen: Soft & bowel sounds normal. Non-tender w/o guarding, rebound, hernias, masses or organomegaly.  Lymphatics: Unremarkable.  Musculoskeletal: Full ROM all peripheral extremities, joint stability, 5/5 strength and  normal gait.  Skin: Warm, dry without exposed rashes, lesions or ecchymosis apparent.  Neuro: Cranial nerves intact, reflexes equal bilaterally. Sensory-motor testing grossly intact. Tendon reflexes grossly intact.  Pysch: Alert & oriented x 3.  Insight and judgement nl & appropriate. No ideations.  Assessment and Plan:  1. Labile hypertension  - Continue medication, monitor blood pressure at home.  - Continue DASH diet.  Reminder to go to the ER if any CP,  SOB, nausea, dizziness, severe HA, changes vision/speech.  - CBC with Differential/Platelet - COMPLETE METABOLIC PANEL WITH GFR - Magnesium - TSH  2. Hyperlipidemia, mixed  - Continue diet/meds, exercise,& lifestyle modifications.  - Continue monitor periodic cholesterol/liver & renal functions   - Lipid panel - TSH  3. Prediabetes  - Continue diet, exercise, lifestyle modifications.  - Monitor appropriate labs.  - Hemoglobin A1c - Insulin, random  4. Vitamin D deficiency  - Continue supplementation.   - VITAMIN D 25 Hydroxyl  5. Allergic rhinitis  6. Medication management  - CBC with Differential/Platelet - COMPLETE METABOLIC PANEL WITH GFR - Magnesium - Lipid panel - TSH - Hemoglobin A1c -  Insulin, random - VITAMIN D 25 Hydroxyl      Discussed  regular exercise, BP monitoring, weight control to achieve/maintain BMI less than 25 and discussed med and SE's. Recommended labs to assess and monitor clinical status with further disposition pending results of labs. Over 30 minutes of exam, counseling, chart review was performed.

## 2018-04-20 NOTE — Patient Instructions (Signed)

## 2018-04-21 ENCOUNTER — Other Ambulatory Visit: Payer: Self-pay | Admitting: Internal Medicine

## 2018-04-21 LAB — TSH: TSH: 1.17 mIU/L (ref 0.40–4.50)

## 2018-04-21 LAB — COMPLETE METABOLIC PANEL WITH GFR
AG RATIO: 1.9 (calc) (ref 1.0–2.5)
ALBUMIN MSPROF: 4.3 g/dL (ref 3.6–5.1)
ALT: 25 U/L (ref 6–29)
AST: 19 U/L (ref 10–35)
Alkaline phosphatase (APISO): 70 U/L (ref 33–130)
BILIRUBIN TOTAL: 1.2 mg/dL (ref 0.2–1.2)
BUN: 12 mg/dL (ref 7–25)
CALCIUM: 9.1 mg/dL (ref 8.6–10.4)
CHLORIDE: 106 mmol/L (ref 98–110)
CO2: 24 mmol/L (ref 20–32)
Creat: 0.74 mg/dL (ref 0.50–0.99)
GFR, EST NON AFRICAN AMERICAN: 87 mL/min/{1.73_m2} (ref >=60)
GFR, Est African American: 101 mL/min/{1.73_m2} (ref >=60)
Globulin: 2.3 g/dL (calc) (ref 1.9–3.7)
Glucose, Bld: 114 mg/dL — ABNORMAL HIGH (ref 65–99)
POTASSIUM: 3.9 mmol/L (ref 3.5–5.3)
Sodium: 141 mmol/L (ref 135–146)
Total Protein: 6.6 g/dL (ref 6.1–8.1)

## 2018-04-21 LAB — INSULIN, RANDOM: INSULIN: 9.5 u[IU]/mL (ref 2.0–19.6)

## 2018-04-21 LAB — CBC WITH DIFFERENTIAL/PLATELET
Basophils Absolute: 39 cells/uL (ref 0–200)
Basophils Relative: 0.7 %
EOS ABS: 237 {cells}/uL (ref 15–500)
Eosinophils Relative: 4.3 %
HEMATOCRIT: 38 % (ref 35.0–45.0)
HEMOGLOBIN: 13.1 g/dL (ref 11.7–15.5)
LYMPHS ABS: 2266 {cells}/uL (ref 850–3900)
MCH: 32 pg (ref 27.0–33.0)
MCHC: 34.5 g/dL (ref 32.0–36.0)
MCV: 92.7 fL (ref 80.0–100.0)
MPV: 10.7 fL (ref 7.5–12.5)
Monocytes Relative: 8.9 %
NEUTROS ABS: 2470 {cells}/uL (ref 1500–7800)
Neutrophils Relative %: 44.9 %
PLATELETS: 270 10*3/uL (ref 140–400)
RBC: 4.1 10*6/uL (ref 3.80–5.10)
RDW: 12.2 % (ref 11.0–15.0)
TOTAL LYMPHOCYTE: 41.2 %
WBC mixed population: 490 cells/uL (ref 200–950)
WBC: 5.5 10*3/uL (ref 3.8–10.8)

## 2018-04-21 LAB — MAGNESIUM: Magnesium: 1.8 mg/dL (ref 1.5–2.5)

## 2018-04-21 LAB — LIPID PANEL
CHOL/HDL RATIO: 3.7 (calc) (ref <5.0)
Cholesterol: 275 mg/dL — ABNORMAL HIGH (ref <200)
HDL: 74 mg/dL (ref >50)
LDL Cholesterol (Calc): 176 mg/dL (calc) — ABNORMAL HIGH
Non-HDL Cholesterol (Calc): 201 mg/dL (calc) — ABNORMAL HIGH (ref <130)
TRIGLYCERIDES: 121 mg/dL (ref <150)

## 2018-04-21 LAB — VITAMIN D 25 HYDROXY (VIT D DEFICIENCY, FRACTURES): Vit D, 25-Hydroxy: 37 ng/mL (ref 30–100)

## 2018-04-21 LAB — HEMOGLOBIN A1C
EAG (MMOL/L): 6.3 (calc)
HEMOGLOBIN A1C: 5.6 %{Hb} (ref <5.7)
Mean Plasma Glucose: 114 (calc)

## 2018-04-21 MED ORDER — EZETIMIBE 10 MG PO TABS: 10.0000 mg | ORAL_TABLET | Freq: Every day | ORAL | 1 refills | Status: DC

## 2018-04-22 DIAGNOSIS — F900 Attention-deficit hyperactivity disorder, predominantly inattentive type: Secondary | ICD-10-CM | POA: Diagnosis not present

## 2018-04-22 DIAGNOSIS — F3342 Major depressive disorder, recurrent, in full remission: Secondary | ICD-10-CM | POA: Diagnosis not present

## 2018-05-06 ENCOUNTER — Ambulatory Visit (INDEPENDENT_AMBULATORY_CARE_PROVIDER_SITE_OTHER): Payer: BLUE CROSS/BLUE SHIELD | Admitting: Internal Medicine

## 2018-05-06 VITALS — BP 122/80 | HR 96 | Temp 97.3°F | Resp 16 | Ht 63.0 in | Wt 218.9 lb

## 2018-05-06 DIAGNOSIS — M76891 Other specified enthesopathies of right lower limb, excluding foot: Secondary | ICD-10-CM

## 2018-05-06 MED ORDER — DEXAMETHASONE 1 MG PO TABS: ORAL_TABLET | ORAL | 0 refills | Status: DC

## 2018-05-09 ENCOUNTER — Encounter: Payer: Self-pay | Admitting: Internal Medicine

## 2018-05-09 NOTE — Progress Notes (Signed)
   Subjective:    Patient ID: Haley White, female    DOB: 1956-03-25, 62 y.o.   MRN: [id number]  HPI   This very nice 62 yo [name] presented with c/o pain in her lateral Rt knee. No Injury. Denies any locking or effusion.   Medication Sig  . albuterol HFA inhaler 2 puffs  every 4  hours as needed   . FLUoxetine  20 MG capsule Take 1 capsule  daily. For depression  . FLONASE   nasal spray U 1 TO 2 SPRAYS  QD  . guaiFENesin-codeine 100-10 MG/5ML syrup Take 10 mLs  3  times daily as needed f  . ibuprofen  200 MG tablet Take 600 mg every 6  hrs as needed   . levocetirizine  5 MG tablet TK 1 T  QD IN THE EVE  . methylphenidate  10 MG tablet TK 1 T  BID  . montelukast  10 MG tablet Take 1 tablet daily for allergies/Asthma  . rosuvastatin  40 MG tablet Take  1 tablet daily   . SYMBICORT 160-4.5 inhaler INL 2 PFS PO BID  . ezetimibe  10 MG tablet Take 1 tablet  daily (not taking: Reported on 05/06/2018)   Allergies  Allergen Reactions  . Biaxin [Clarithromycin]     GI upset  . Serevent [Salmeterol]     Palpitations  . Tequin [Gatifloxacin]     GI upset. thrush   Past Medical History:  Diagnosis Date  . Anxiety   . Arthritis   . Asthma   . Cataract   . Gout   . Hypertension   . Obese    Past Surgical History:  Procedure Laterality Date  . APPENDECTOMY  1984  . CATARACT EXTRACTION W/ INTRAOCULAR LENS  IMPLANT, BILATERAL  2014  . [place]   X3  . FOOT SURGERY Left 2002  . KNEE SURGERY Left 1998  . ROTATOR CUFF REPAIR Right 2007  . TUBAL LIGATION  1994   Review of Systems   10 point systems review negative except as above.    Objective:   Physical Exam  BP 122/80   Pulse 96   Temp (!) 97.3 F (36.3 C)   Resp 16   Ht 5\' 3"  (1.6 m)   Wt 218 lb 14.4 oz (99.3 kg)   LMP 11/26/2011   BMI 38.78 kg/m   HEENT - WNL. Neck - supple.  Chest - Clear equal BS. Cor - Nl HS. RRR w/o sig MGR. PP 1(+). No edema. MS- FROM w/o deformities. Pointy tender over  Rt lateral knee over lateral fibular ligament. No knee effusion. No laxity and Knee stability is normal.   Gait Nl. Neuro -  Nl w/o focal abnormalities.    Assessment & Plan:   1. Tendonitis of knee, right  - dexamethasone (DECADRON) 1 MG tablet; Take 1 tab 3 x day - 3 days, then 2 x day - 3 days, then 1 tab daily  Dispense: 20 tablet; Refill: 0

## 2018-05-13 DIAGNOSIS — Z79899 Other long term (current) drug therapy: Secondary | ICD-10-CM | POA: Diagnosis not present

## 2018-05-13 DIAGNOSIS — Y93E9 Activity, other interior property and clothing maintenance: Secondary | ICD-10-CM | POA: Diagnosis not present

## 2018-05-13 DIAGNOSIS — W07XXXA Fall from chair, initial encounter: Secondary | ICD-10-CM | POA: Diagnosis not present

## 2018-05-13 DIAGNOSIS — R51 Headache: Secondary | ICD-10-CM | POA: Diagnosis not present

## 2018-05-13 DIAGNOSIS — S138XXA Sprain of joints and ligaments of other parts of neck, initial encounter: Secondary | ICD-10-CM | POA: Diagnosis not present

## 2018-05-13 DIAGNOSIS — S0990XA Unspecified injury of head, initial encounter: Secondary | ICD-10-CM | POA: Diagnosis not present

## 2018-05-13 DIAGNOSIS — M542 Cervicalgia: Secondary | ICD-10-CM | POA: Diagnosis not present

## 2018-05-13 DIAGNOSIS — S199XXA Unspecified injury of neck, initial encounter: Secondary | ICD-10-CM | POA: Diagnosis not present

## 2018-05-19 DIAGNOSIS — J301 Allergic rhinitis due to pollen: Secondary | ICD-10-CM | POA: Diagnosis not present

## 2018-05-21 DIAGNOSIS — J301 Allergic rhinitis due to pollen: Secondary | ICD-10-CM | POA: Diagnosis not present

## 2018-05-25 DIAGNOSIS — J301 Allergic rhinitis due to pollen: Secondary | ICD-10-CM | POA: Diagnosis not present

## 2018-05-27 DIAGNOSIS — J301 Allergic rhinitis due to pollen: Secondary | ICD-10-CM | POA: Diagnosis not present

## 2018-06-22 DIAGNOSIS — J301 Allergic rhinitis due to pollen: Secondary | ICD-10-CM | POA: Diagnosis not present

## 2018-06-26 ENCOUNTER — Other Ambulatory Visit: Payer: Self-pay | Admitting: Physician Assistant

## 2018-06-26 MED ORDER — OSELTAMIVIR PHOSPHATE 75 MG PO CAPS: 75.0000 mg | ORAL_CAPSULE | Freq: Two times a day (BID) | ORAL | 0 refills | Status: AC

## 2018-07-06 ENCOUNTER — Encounter (HOSPITAL_COMMUNITY): Payer: Self-pay | Admitting: Emergency Medicine

## 2018-07-06 ENCOUNTER — Emergency Department (HOSPITAL_COMMUNITY)
Admission: EM | Admit: 2018-07-06 | Discharge: 2018-07-07 | Disposition: A | Discharge status: Home / Self Care | Payer: Self-pay | Attending: Emergency Medicine | Admitting: Emergency Medicine

## 2018-07-06 DIAGNOSIS — S52614A Nondisplaced fracture of right ulna styloid process, initial encounter for closed fracture: Secondary | ICD-10-CM | POA: Insufficient documentation

## 2018-07-06 DIAGNOSIS — E119 Type 2 diabetes mellitus without complications: Secondary | ICD-10-CM | POA: Insufficient documentation

## 2018-07-06 DIAGNOSIS — S52514A Nondisplaced fracture of right radial styloid process, initial encounter for closed fracture: Secondary | ICD-10-CM | POA: Insufficient documentation

## 2018-07-06 DIAGNOSIS — Z87891 Personal history of nicotine dependence: Secondary | ICD-10-CM | POA: Insufficient documentation

## 2018-07-06 DIAGNOSIS — Y92017 Garden or yard in single-family (private) house as the place of occurrence of the external cause: Secondary | ICD-10-CM | POA: Insufficient documentation

## 2018-07-06 DIAGNOSIS — Y9389 Activity, other specified: Secondary | ICD-10-CM | POA: Insufficient documentation

## 2018-07-06 DIAGNOSIS — W01198A Fall on same level from slipping, tripping and stumbling with subsequent striking against other object, initial encounter: Secondary | ICD-10-CM | POA: Insufficient documentation

## 2018-07-06 DIAGNOSIS — Y999 Unspecified external cause status: Secondary | ICD-10-CM | POA: Insufficient documentation

## 2018-07-06 DIAGNOSIS — Z79899 Other long term (current) drug therapy: Secondary | ICD-10-CM | POA: Insufficient documentation

## 2018-07-06 DIAGNOSIS — W19XXXA Unspecified fall, initial encounter: Secondary | ICD-10-CM

## 2018-07-06 DIAGNOSIS — I10 Essential (primary) hypertension: Secondary | ICD-10-CM | POA: Insufficient documentation

## 2018-07-06 MED ORDER — OXYCODONE-ACETAMINOPHEN 5-325 MG PO TABS
1.0000 | ORAL_TABLET | ORAL | Status: AC | PRN
  Administered 2018-07-06 – 2018-07-07 (×2): 1 via ORAL
  Filled 2018-07-06 (×2): qty 1

## 2018-07-06 NOTE — ED Triage Notes (Signed)
Pt reports mechanical fall tonight with injury to R hand.

## 2018-07-07 ENCOUNTER — Emergency Department (HOSPITAL_COMMUNITY): Payer: BLUE CROSS/BLUE SHIELD

## 2018-07-07 IMAGING — DX DG WRIST COMPLETE 3+V*R*
3 series · 3 of 3 positions shown · non-contrast
Comparison: None.

CLINICAL DATA: Fell onto RIGHT arm today.

EXAM:
RIGHT WRIST - COMPLETE 3+ VIEW; RIGHT HAND - COMPLETE 3+ VIEW; RIGHT
ELBOW - COMPLETE 3+ VIEW

[wrist pa]
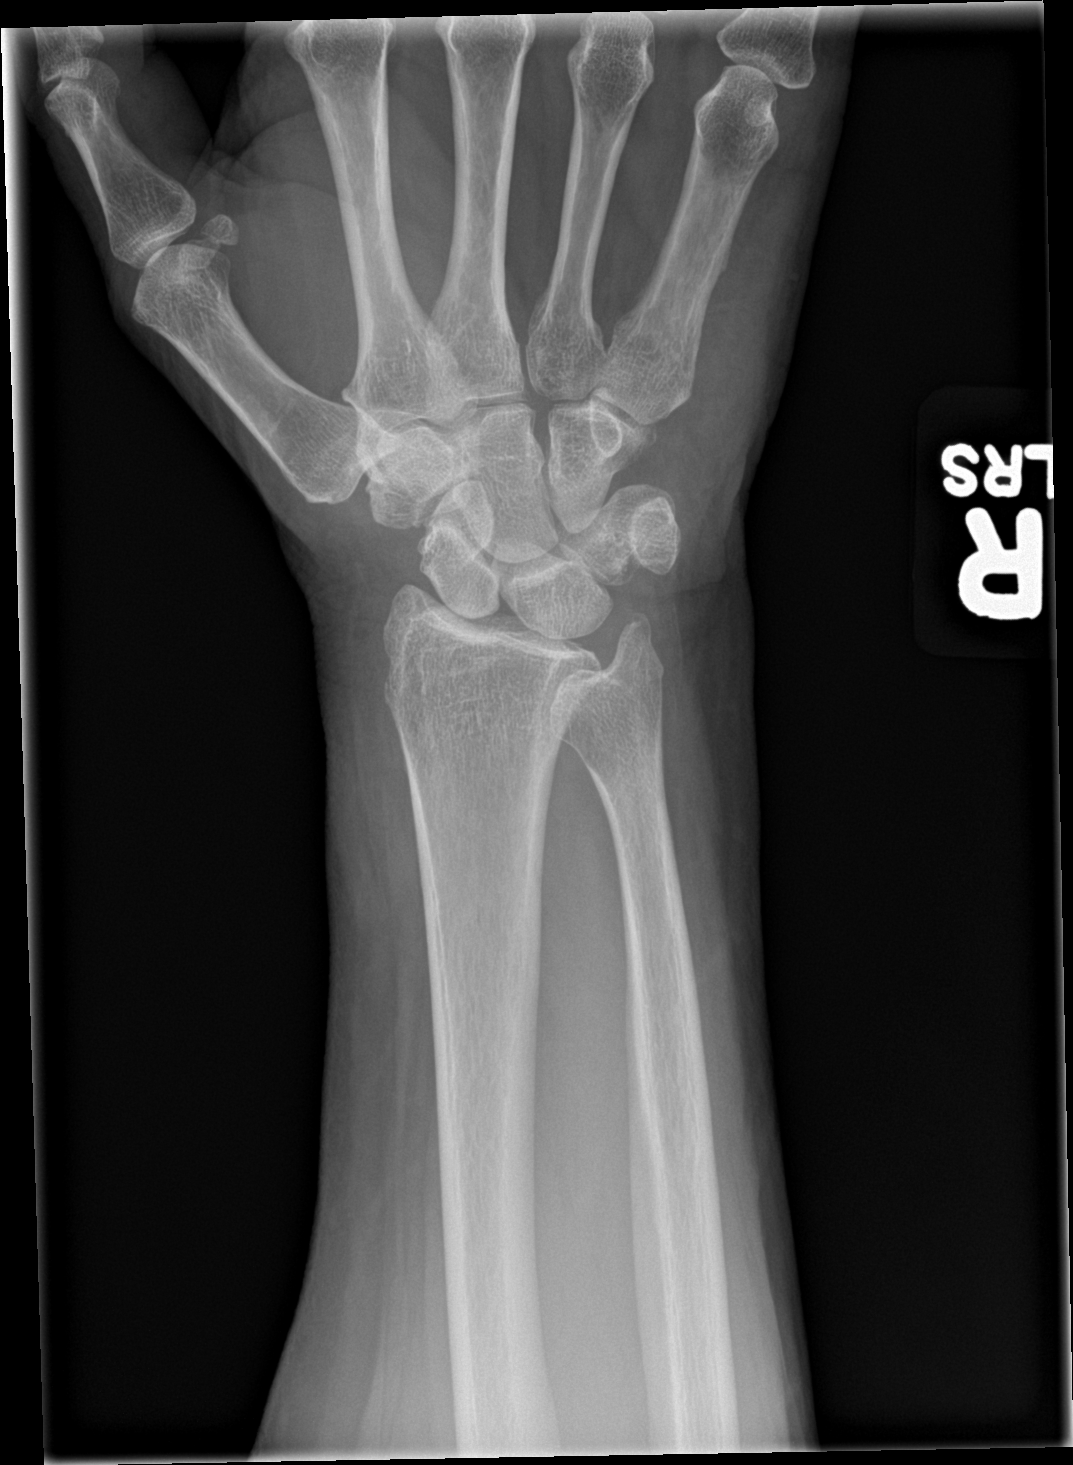

[wrist obl]
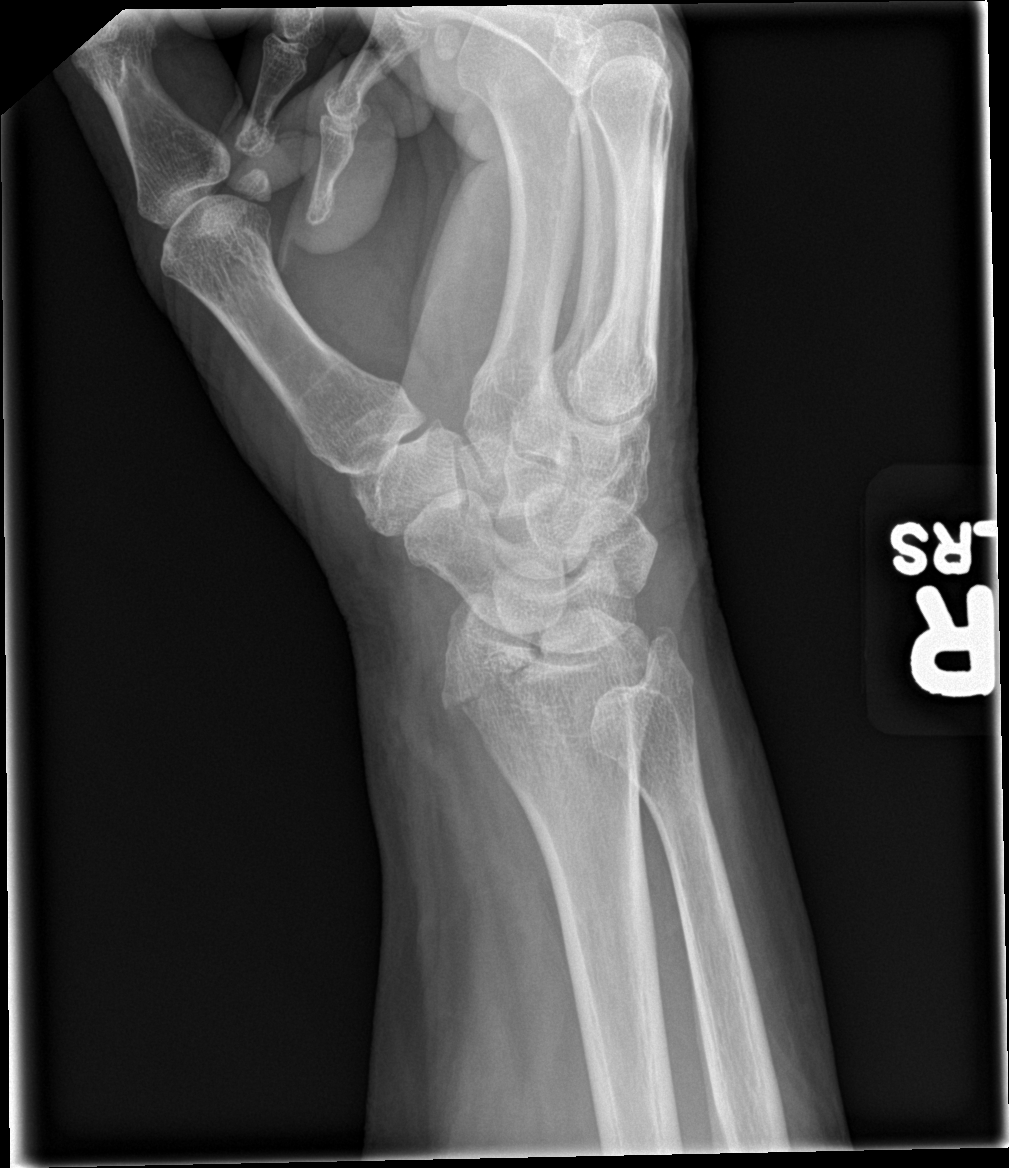

[wrist lat]
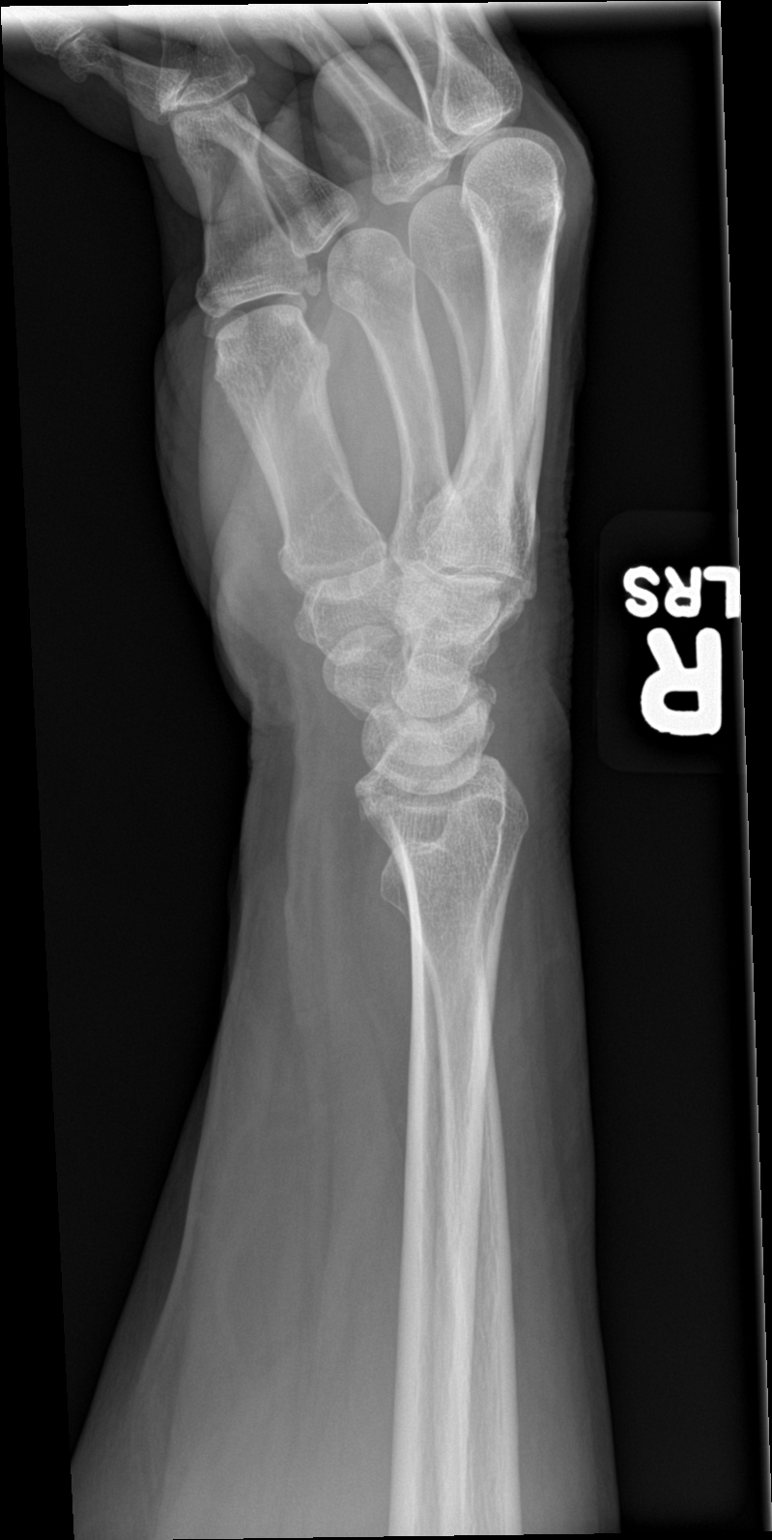

[3 of 3 positions shown; findings below may reference images not displayed]

FINDINGS: RIGHT hand: There is no evidence of fracture or dislocation. There
is no evidence of arthropathy or other focal bone abnormality. Soft
tissues are unremarkable.

RIGHT wrist: Oblique nondisplaced fracture through the radial
styloid with intra-articular extension. Nondisplaced ulnar styloid
fracture. No dislocation. There is no evidence of arthropathy or
other focal bone abnormality. Mild dorsal wrist soft tissue swelling
without subcutaneous gas or radiopaque foreign bodies.

RIGHT elbow: There is no evidence of fracture or dislocation. There
is no evidence of arthropathy or other focal bone abnormality. Soft
tissues are unremarkable.
IMPRESSION: Acute nondisplaced radial styloid and ulnar styloid fractures.

## 2018-07-07 IMAGING — DX DG HAND COMPLETE 3+V*R*
3 series · 3 of 3 positions shown · non-contrast
Comparison: None.

CLINICAL DATA: Fell onto RIGHT arm today.

EXAM:
RIGHT WRIST - COMPLETE 3+ VIEW; RIGHT HAND - COMPLETE 3+ VIEW; RIGHT
ELBOW - COMPLETE 3+ VIEW

[hand pa]
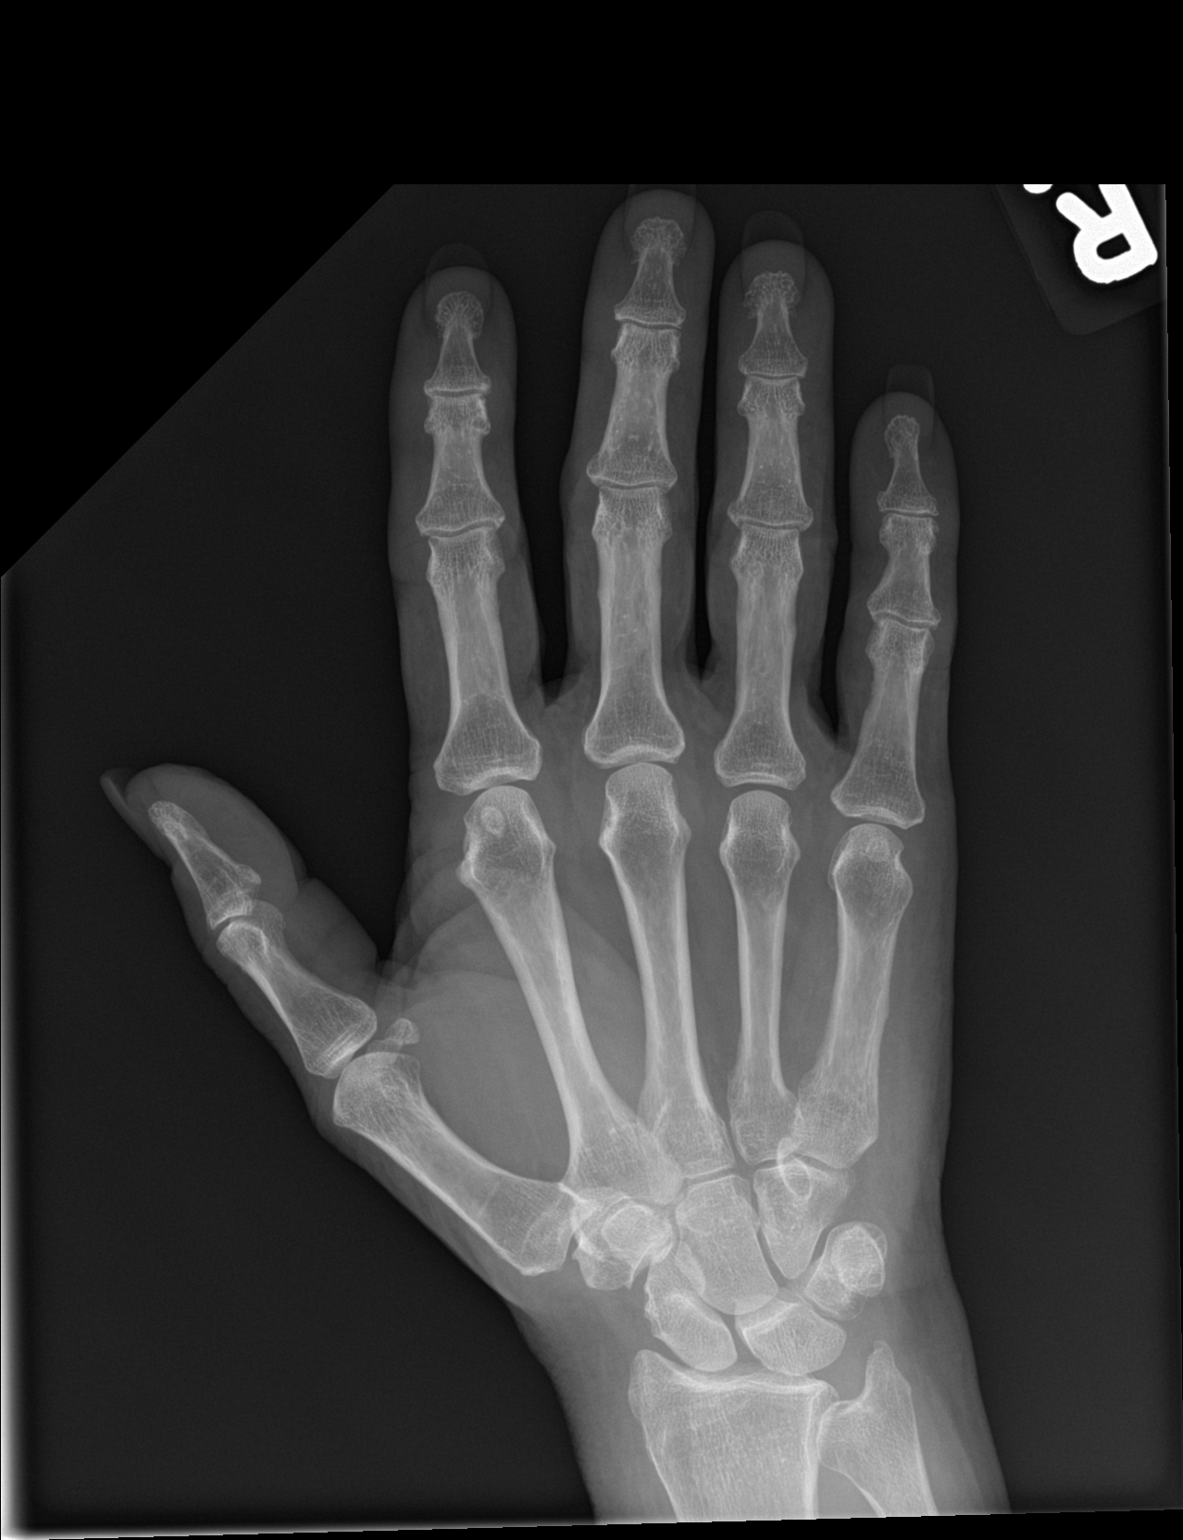

[hand obl]
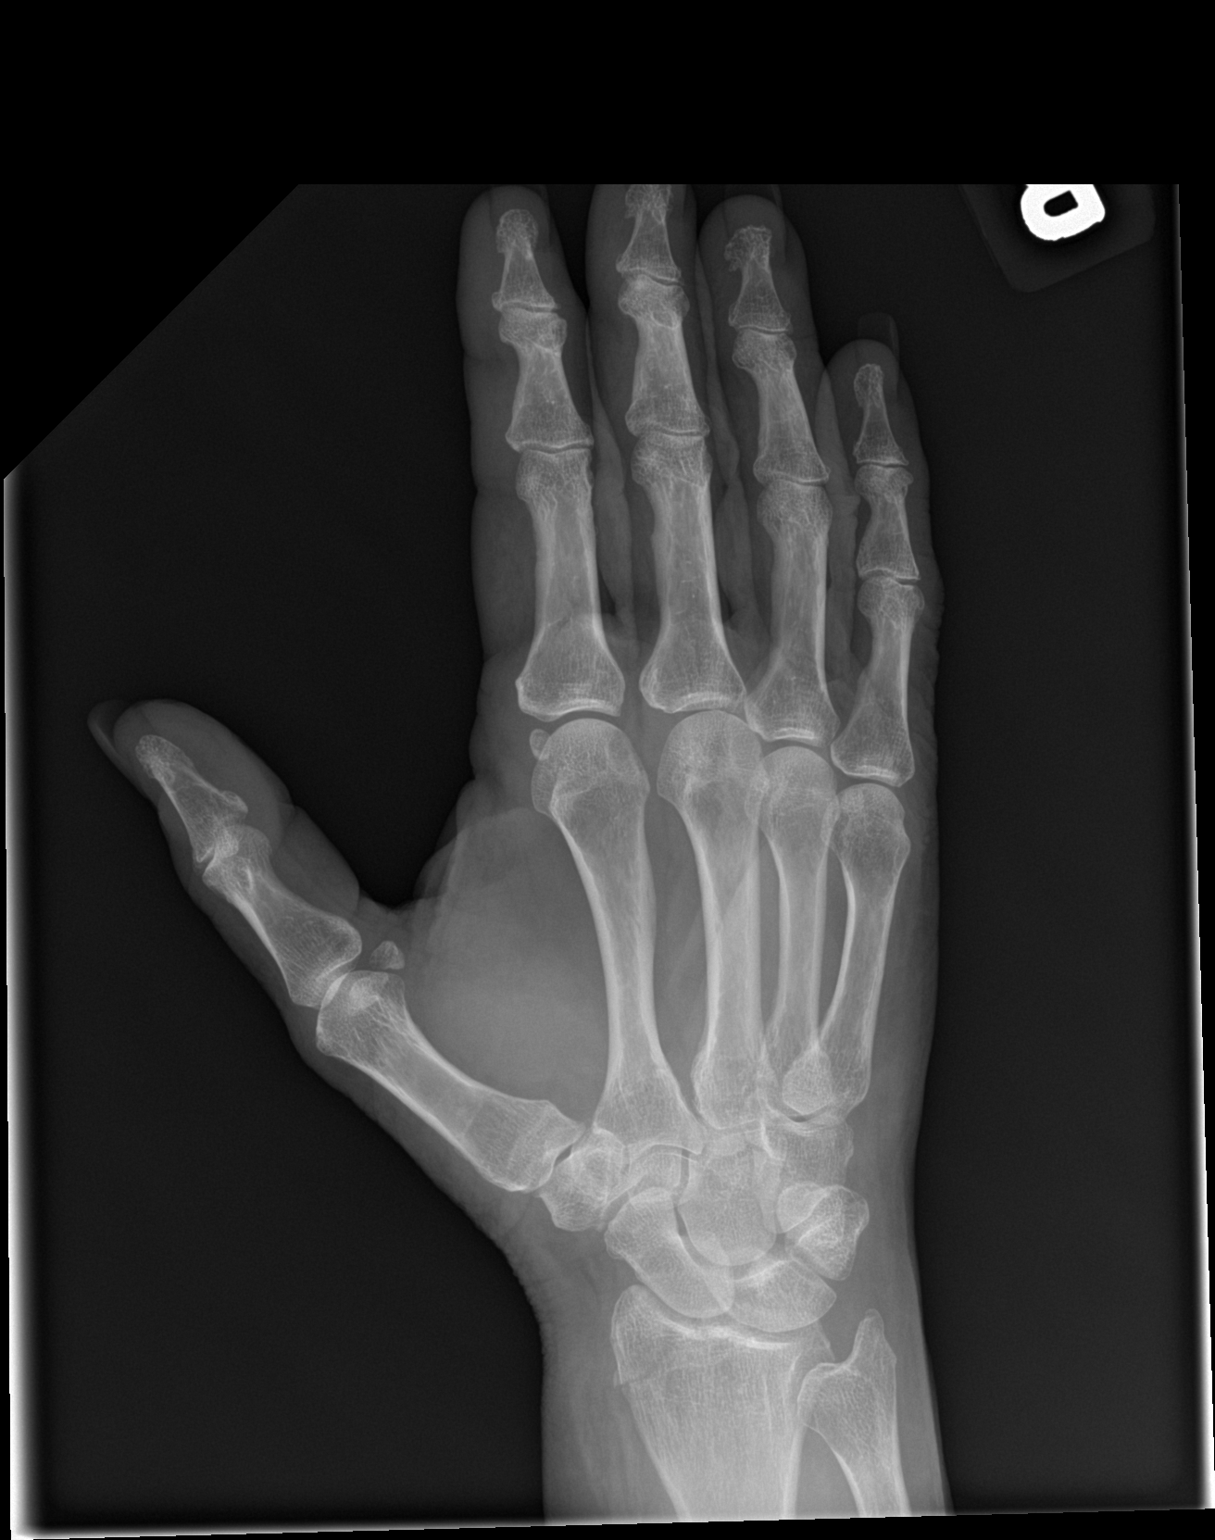

[hand lat]
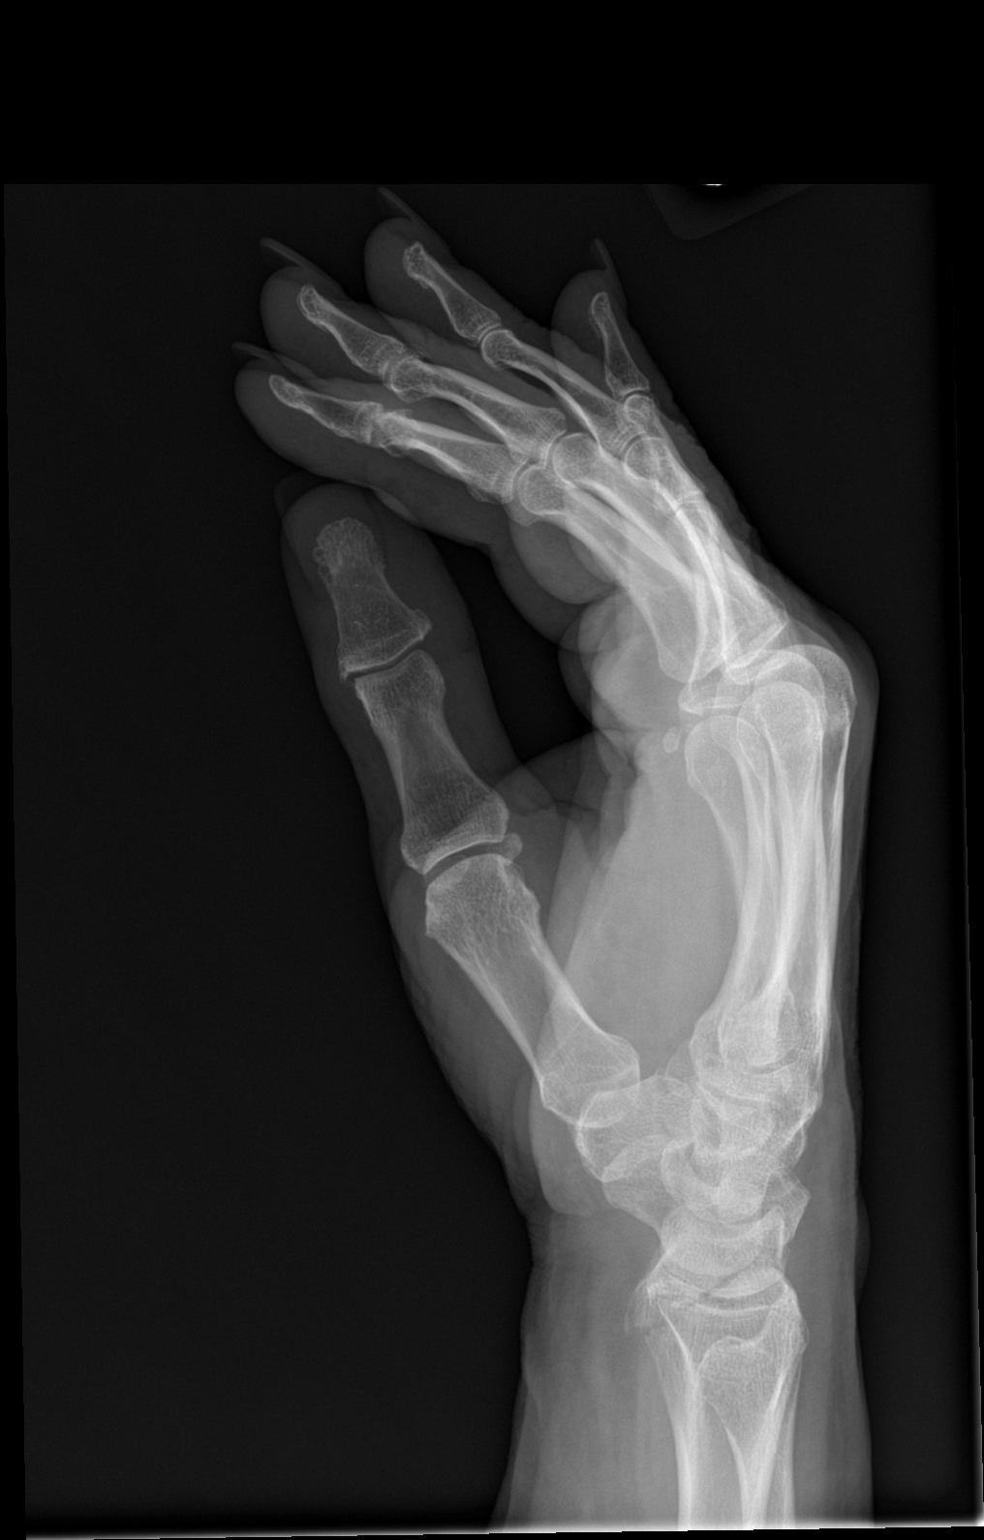

[3 of 3 positions shown; findings below may reference images not displayed]

FINDINGS: RIGHT hand: There is no evidence of fracture or dislocation. There
is no evidence of arthropathy or other focal bone abnormality. Soft
tissues are unremarkable.

RIGHT wrist: Oblique nondisplaced fracture through the radial
styloid with intra-articular extension. Nondisplaced ulnar styloid
fracture. No dislocation. There is no evidence of arthropathy or
other focal bone abnormality. Mild dorsal wrist soft tissue swelling
without subcutaneous gas or radiopaque foreign bodies.

RIGHT elbow: There is no evidence of fracture or dislocation. There
is no evidence of arthropathy or other focal bone abnormality. Soft
tissues are unremarkable.
IMPRESSION: Acute nondisplaced radial styloid and ulnar styloid fractures.

## 2018-07-07 IMAGING — DX DG ELBOW COMPLETE 3+V*R*
4 series · 4 of 4 positions shown · non-contrast
Comparison: None.

CLINICAL DATA: Fell onto RIGHT arm today.

EXAM:
RIGHT WRIST - COMPLETE 3+ VIEW; RIGHT HAND - COMPLETE 3+ VIEW; RIGHT
ELBOW - COMPLETE 3+ VIEW

[elbow ap]
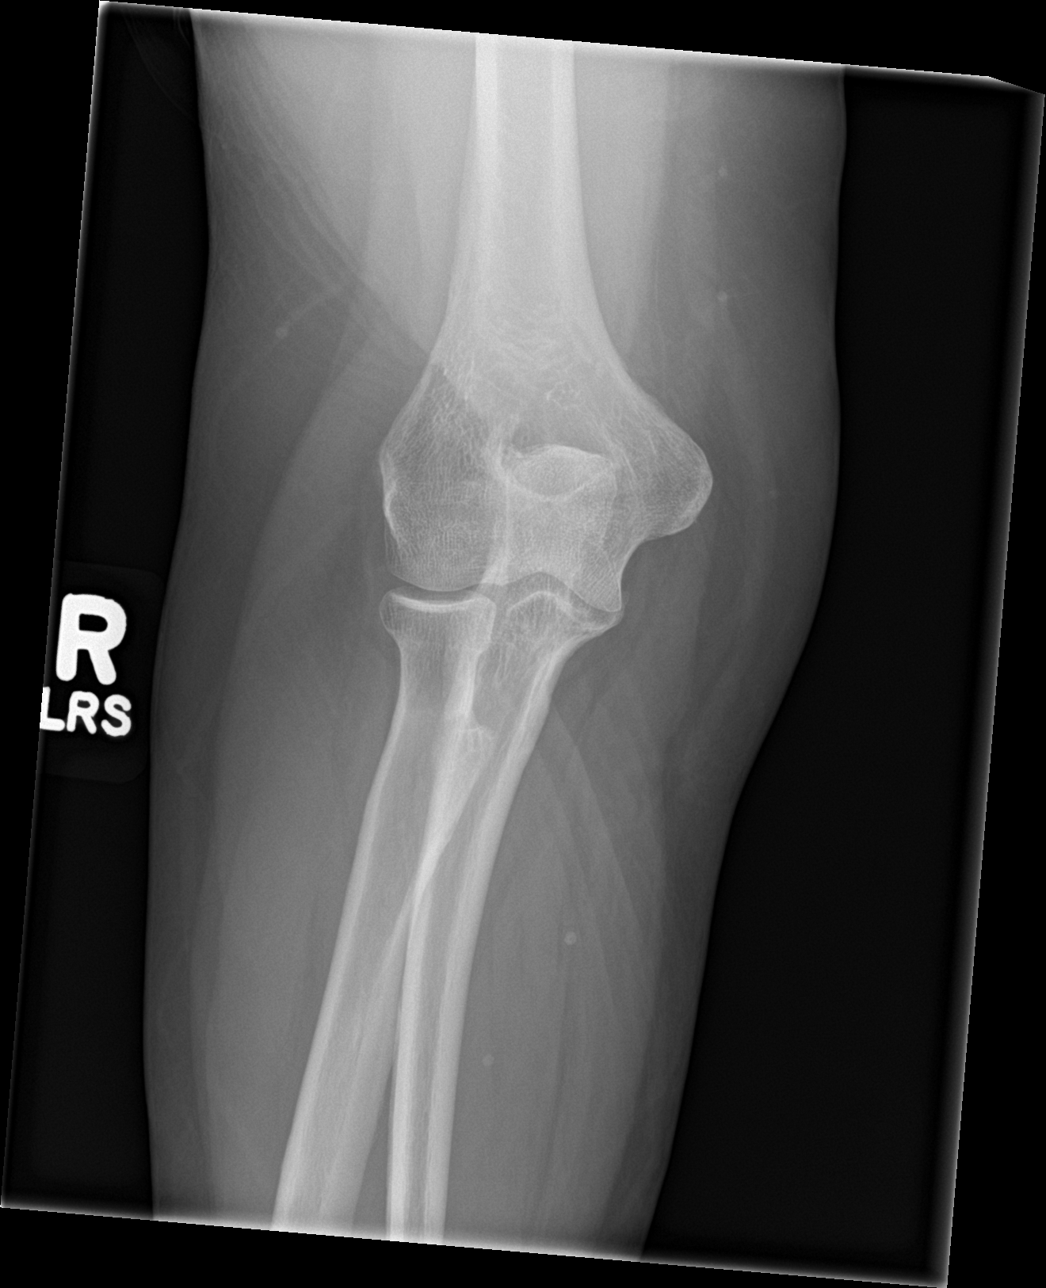

[elbow obl (1 of 2)]
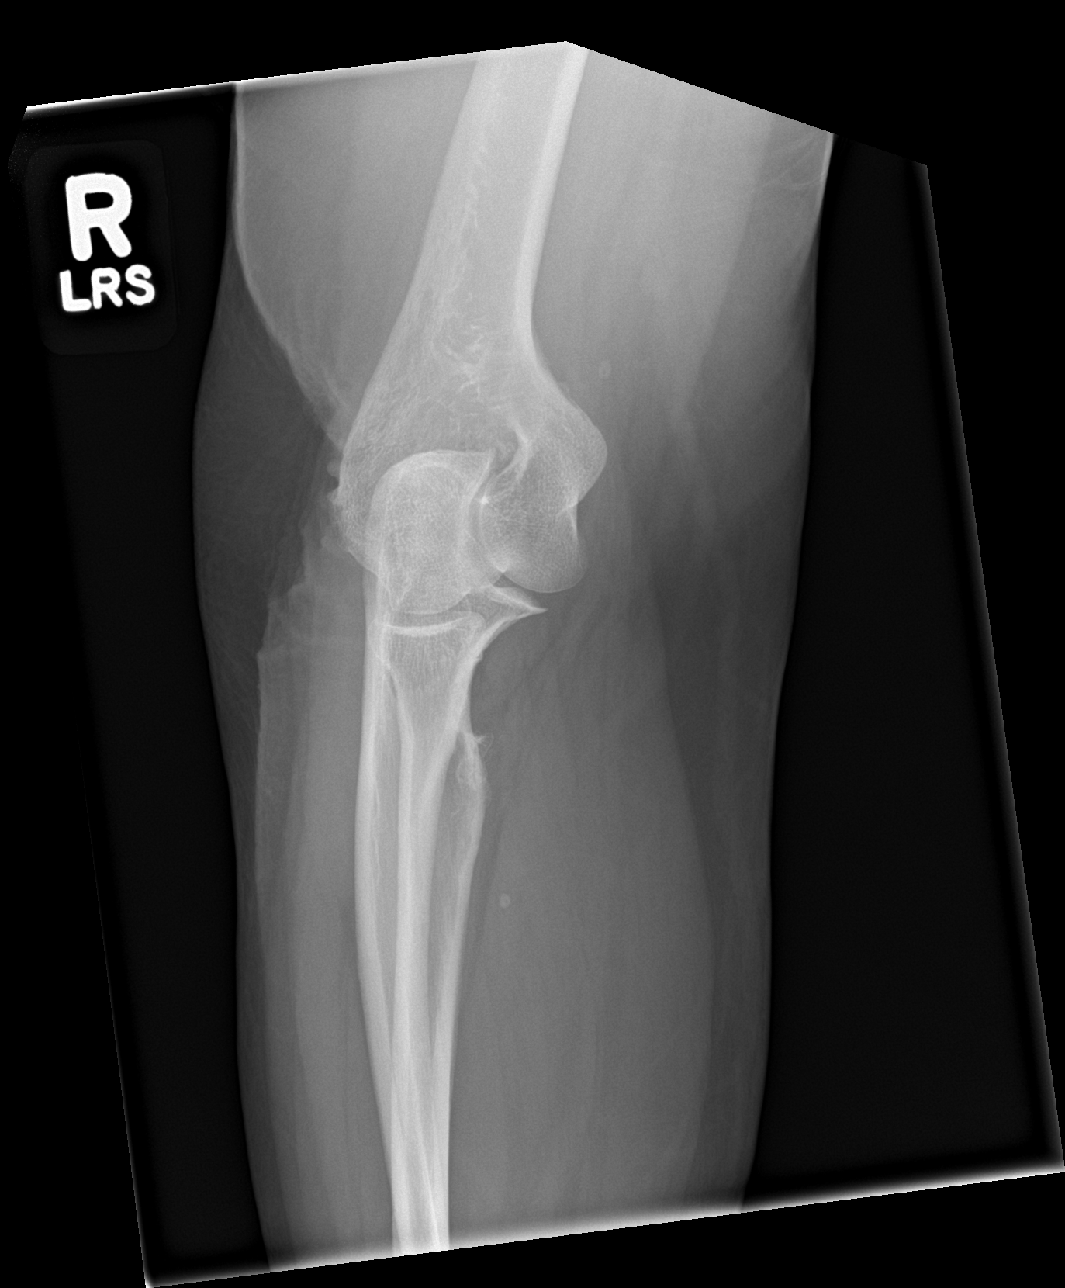

[elbow obl (2 of 2)]
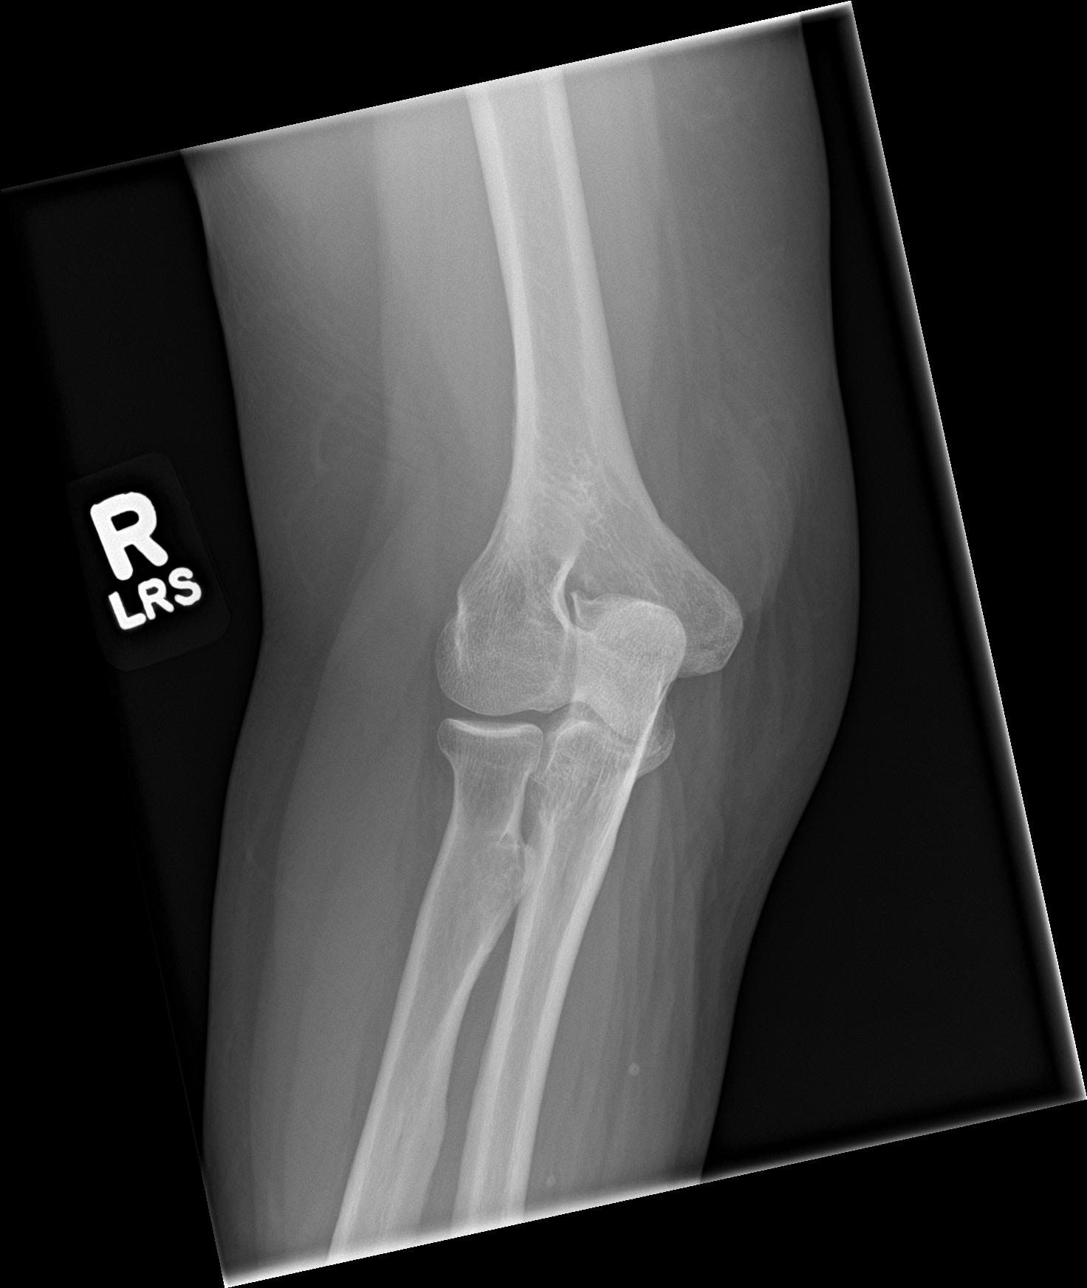

[elbow lat]
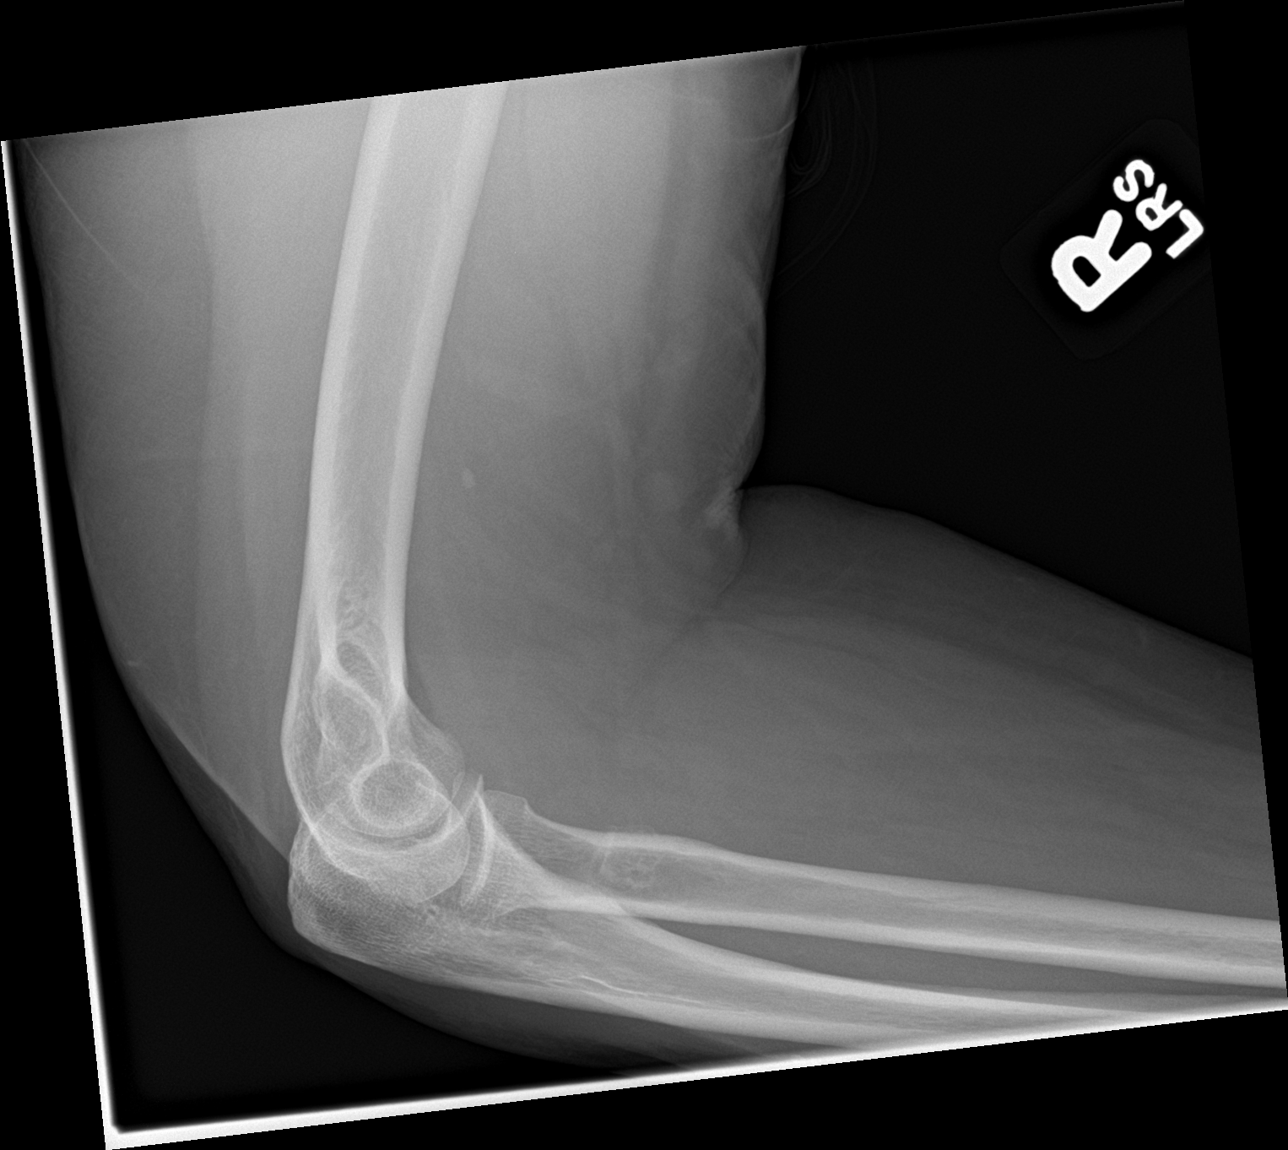

[4 of 4 positions shown; findings below may reference images not displayed]

FINDINGS: RIGHT hand: There is no evidence of fracture or dislocation. There
is no evidence of arthropathy or other focal bone abnormality. Soft
tissues are unremarkable.

RIGHT wrist: Oblique nondisplaced fracture through the radial
styloid with intra-articular extension. Nondisplaced ulnar styloid
fracture. No dislocation. There is no evidence of arthropathy or
other focal bone abnormality. Mild dorsal wrist soft tissue swelling
without subcutaneous gas or radiopaque foreign bodies.

RIGHT elbow: There is no evidence of fracture or dislocation. There
is no evidence of arthropathy or other focal bone abnormality. Soft
tissues are unremarkable.
IMPRESSION: Acute nondisplaced radial styloid and ulnar styloid fractures.

## 2018-07-07 MED ORDER — NAPROXEN 500 MG PO TABS: 500.0000 mg | ORAL_TABLET | Freq: Two times a day (BID) | ORAL | 0 refills | Status: DC | PRN

## 2018-07-07 MED ORDER — OXYCODONE-ACETAMINOPHEN 5-325 MG PO TABS: 1.0000 | ORAL_TABLET | Freq: Four times a day (QID) | ORAL | 0 refills | Status: DC | PRN

## 2018-07-07 NOTE — Discharge Instructions (Signed)
You have been found to have a fracture to your radial styloid and ulnar styloid.  You have been placed in a splint for stability.  Do not remove the splint unless instructed to do so by an orthopedic hand surgeon.  You may ice over top of your splint 3-4 times per day for 15 to 20 minutes each time to limit inflammation.    We recommend consistent use of naproxen for pain control.  You have been prescribed Percocet to take as needed for severe pain.  Do not drive or drink alcohol after taking Percocet as it may make you drowsy and impair your judgment.  We recommend follow-up with Haley White to ensure resolution of symptoms.  Call to make a follow up appointment.  Return to the ED for any new or concerning symptoms.

## 2018-07-07 NOTE — ED Provider Notes (Signed)
[facility] EMERGENCY DEPARTMENT Provider Note   CSN: [id number] Arrival date & time: 07/06/18  2335     History   Chief Complaint Chief Complaint  Patient presents with  . Fall    HPI Haley White is a 62 y.o. female.   62 year old female with no significant past medical history presents to the emergency department for evaluation of right upper extremity pain after a fall.  She states that her grass was overgrown and she was unable to see her garden edging.  She tripped on is causing her to fall forward.  She has pain in her right wrist which is sharp, constant.  It will intermittently worsen and radiate up towards her right shoulder.  Denies taking any medications prior to arrival for pain.  Has been followed by [facility] for prior injuries.     Past Medical History:  Diagnosis Date  . Anxiety   . Arthritis   . Asthma   . Cataract   . Gout   . Hypertension   . Obese     Patient Active Problem List   Diagnosis Date Noted  . 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 Major depressive disorder, recurrent, severe without psychotic features ([place])   . 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 Suicide attempt by drug ingestion ([place])   . Medication management 08/22/2014  . Vitamin D deficiency 08/22/2014  . Hyperlipidemia 08/22/2014  . Hypertension   . Anxiety   . T2_NIDDM   . Obese     Past Surgical History:  Procedure Laterality Date  . APPENDECTOMY  1984  . CATARACT EXTRACTION W/ INTRAOCULAR LENS  IMPLANT, BILATERAL  2014  . [place]   X3  . FOOT SURGERY Left 2002  . KNEE SURGERY Left 1998  . ROTATOR CUFF REPAIR Right 2007  . TUBAL LIGATION  1994     OB History   None      Home Medications    Prior to Admission medications   Medication Sig Start Date End Date Taking? Authorizing Provider  albuterol (VENTOLIN HFA) 108 (90 Base) MCG/ACT inhaler Inhale 2 puffs into the lungs every 4 (four) hours as needed for  wheezing or shortness of breath. 07/17/17   Liane Comber, NP  dexamethasone (DECADRON) 1 MG tablet Take 1 tab 3 x day - 3 days, then 2 x day - 3 days, then 1 tab daily 05/06/18   Unk Pinto, MD  ezetimibe (ZETIA) 10 MG tablet Take 1 tablet (10 mg total) by mouth daily. Patient not taking: Reported on 05/06/2018 04/21/18 04/21/19  Unk Pinto, MD  FLUoxetine (PROZAC) 20 MG capsule Take 1 capsule (20 mg total) by mouth daily. For depression 05/08/15   Lindell Spar I, NP  fluticasone (FLONASE) 50 MCG/ACT nasal spray SHAKE LQ AND U 1 TO 2 SPRAYS IEN QD 10/17/17   [provider]  guaiFENesin-codeine 100-10 MG/5ML syrup Take 10 mLs by mouth 3 (three) times daily as needed for cough. 07/22/17   Varney Biles, MD  ibuprofen (ADVIL,MOTRIN) 200 MG tablet Take 600 mg by mouth every 6 (six) hours as needed for headache.    [provider]  levocetirizine (XYZAL) 5 MG tablet TK 1 T PO QD IN THE EVE 12/07/17   [provider]  methylphenidate (RITALIN) 10 MG tablet TK 1 T PO BID 01/16/18   [provider]  montelukast (SINGULAIR) 10 MG tablet Take 1 tablet daily for allergies/Asthma 07/28/17 07/28/18  Unk Pinto, MD  naproxen (NAPROSYN)  500 MG tablet Take 1 tablet (500 mg total) by mouth every 12 (twelve) hours as needed for mild pain or moderate pain. 07/07/18   Antonietta Breach, PA-C  oxyCODONE-acetaminophen (PERCOCET/ROXICET) 5-325 MG tablet Take 1-2 tablets by mouth every 6 (six) hours as needed for moderate pain or severe pain. 07/07/18   Antonietta Breach, PA-C  rosuvastatin (CRESTOR) 40 MG tablet Take 1/2 to 1 tablet daily or as directed for Cholesterol Patient taking differently: Take 1 tablet by mouth daily 10/09/17   Unk Pinto, MD  [facility] 160-4.5 MCG/ACT inhaler INL 2 PFS PO BID 01/16/18   [provider]    Family History Family History  Problem Relation Age of Onset  . Dementia Father   . Anxiety disorder Sister   . Colon cancer Neg Hx      Social History Social History   Tobacco Use  . Smoking status: Former Smoker    Packs/day: 1.00    Years: 7.00    Pack years: 7.00    Types: Cigarettes    Last attempt to quit: 11/04/1977    Years since quitting: 40.6  . Smokeless tobacco: Never Used  Substance Use Topics  . Alcohol use: Yes    Alcohol/week: 6.0 standard drinks    Types: 6 Glasses of wine per week    Comment: occasional  . Drug use: No     Allergies   Biaxin [clarithromycin]; Serevent [salmeterol]; and Tequin [gatifloxacin]   Review of Systems Review of Systems Ten systems reviewed and are negative for acute change, except as noted in the HPI.    Physical Exam Updated Vital Signs BP (!) 146/77 (BP Location: Right Arm)   Pulse 100   Temp 98.6 F (37 C) (Oral)   Resp 18   Ht 5\' 3"  (1.6 m)   Wt 90.7 kg   LMP 11/26/2011   SpO2 96%   BMI 35.43 kg/m   Physical Exam  Constitutional: She is oriented to person, place, and time. She appears well-developed and well-nourished. No distress.  Nontoxic appearing and in NAD  HENT:  Head: Normocephalic and atraumatic.  Eyes: Conjunctivae and EOM are normal. No scleral icterus.  Neck: Normal range of motion.  Cardiovascular: Normal rate, regular rhythm and intact distal pulses.  Distal radial pulse 2+ in the RUE  Pulmonary/Chest: Effort normal. No respiratory distress.  Respirations even and unlabored  Musculoskeletal: Normal range of motion.  Neurological: She is alert and oriented to person, place, and time. She exhibits normal muscle tone. Coordination normal.  Station to light touch intact in all digits of the right hand.  Patient able to wiggle all fingers.  Skin: Skin is warm and dry. No rash noted. She is not diaphoretic. No erythema. No pallor.  Psychiatric: She has a normal mood and affect. Her behavior is normal.  Nursing note and vitals reviewed.    ED Treatments / Results  Labs (all labs ordered are listed, but only abnormal results are  displayed) Labs Reviewed - No data to display  EKG None  Radiology Dg Elbow Complete Right  Result Date: 07/07/2018 CLINICAL DATA:  [place] onto RIGHT arm today. EXAM: RIGHT WRIST - COMPLETE 3+ VIEW; RIGHT HAND - COMPLETE 3+ VIEW; RIGHT ELBOW - COMPLETE 3+ VIEW COMPARISON:  None. FINDINGS: RIGHT hand: There is no evidence of fracture or dislocation. There is no evidence of arthropathy or other focal bone abnormality. Soft tissues are unremarkable. RIGHT wrist: Oblique nondisplaced fracture through the radial styloid with intra-articular extension. Nondisplaced ulnar styloid  fracture. No dislocation. There is no evidence of arthropathy or other focal bone abnormality. Mild dorsal wrist soft tissue swelling without subcutaneous gas or radiopaque foreign bodies. RIGHT elbow: There is no evidence of fracture or dislocation. There is no evidence of arthropathy or other focal bone abnormality. Soft tissues are unremarkable. IMPRESSION: Acute nondisplaced radial styloid and ulnar styloid fractures. Electronically Signed   By: Elon Alas M.D.   On: 07/07/2018 00:23   Dg Wrist Complete Right  Result Date: 07/07/2018 CLINICAL DATA:  [place] onto RIGHT arm today. EXAM: RIGHT WRIST - COMPLETE 3+ VIEW; RIGHT HAND - COMPLETE 3+ VIEW; RIGHT ELBOW - COMPLETE 3+ VIEW COMPARISON:  None. FINDINGS: RIGHT hand: There is no evidence of fracture or dislocation. There is no evidence of arthropathy or other focal bone abnormality. Soft tissues are unremarkable. RIGHT wrist: Oblique nondisplaced fracture through the radial styloid with intra-articular extension. Nondisplaced ulnar styloid fracture. No dislocation. There is no evidence of arthropathy or other focal bone abnormality. Mild dorsal wrist soft tissue swelling without subcutaneous gas or radiopaque foreign bodies. RIGHT elbow: There is no evidence of fracture or dislocation. There is no evidence of arthropathy or other focal bone abnormality. Soft tissues are  unremarkable. IMPRESSION: Acute nondisplaced radial styloid and ulnar styloid fractures. Electronically Signed   By: Elon Alas M.D.   On: 07/07/2018 00:23   Dg Hand Complete Right  Result Date: 07/07/2018 CLINICAL DATA:  [place] onto RIGHT arm today. EXAM: RIGHT WRIST - COMPLETE 3+ VIEW; RIGHT HAND - COMPLETE 3+ VIEW; RIGHT ELBOW - COMPLETE 3+ VIEW COMPARISON:  None. FINDINGS: RIGHT hand: There is no evidence of fracture or dislocation. There is no evidence of arthropathy or other focal bone abnormality. Soft tissues are unremarkable. RIGHT wrist: Oblique nondisplaced fracture through the radial styloid with intra-articular extension. Nondisplaced ulnar styloid fracture. No dislocation. There is no evidence of arthropathy or other focal bone abnormality. Mild dorsal wrist soft tissue swelling without subcutaneous gas or radiopaque foreign bodies. RIGHT elbow: There is no evidence of fracture or dislocation. There is no evidence of arthropathy or other focal bone abnormality. Soft tissues are unremarkable. IMPRESSION: Acute nondisplaced radial styloid and ulnar styloid fractures. Electronically Signed   By: Elon Alas M.D.   On: 07/07/2018 00:23    Procedures Procedures (including critical care time)  Medications Ordered in ED Medications  oxyCODONE-acetaminophen (PERCOCET/ROXICET) 5-325 MG per tablet 1 tablet (1 tablet Oral Given 07/07/18 0127)     Initial Impression / Assessment and Plan / ED Course  I have reviewed the triage vital signs and the nursing notes.  Pertinent labs & imaging results that were available during my care of the patient were reviewed by me and considered in my medical decision making (see chart for details).     Patient presents to the emergency department for evaluation of R wrist pain. Patient neurovascularly intact on exam. Imaging significant for fractures of the radial and ulnar styloids; nondisplaced.  Compartments in the affected extremity are soft.   She was placed in a sugar tong splint at bedside.  Given a sling for elevation.  We will continue with icing, elevation; discussed need for follow-up with orthopedic hand surgeon. Return precautions discussed and provided. Patient discharged in stable condition with no unaddressed concerns.   Final Clinical Impressions(s) / ED Diagnoses   Final diagnoses:  Closed nondisplaced fracture of styloid process of right radius, initial encounter  Closed nondisplaced fracture of styloid process of right ulna, initial encounter  Fall, initial encounter  ED Discharge Orders         Ordered    oxyCODONE-acetaminophen (PERCOCET/ROXICET) 5-325 MG tablet  Every 6 hours PRN     07/07/18 0134    naproxen (NAPROSYN) 500 MG tablet  Every 12 hours PRN     07/07/18 0134           Antonietta Breach, PA-C 07/07/18 [place], [place], MD 07/09/18 [phone]

## 2018-07-07 NOTE — Progress Notes (Signed)
Orthopedic Tech Progress Note Patient Details:  Haley White [birth year] [id number]  Ortho Devices Type of Ortho Device: Arm sling, Sugartong splint Ortho Device/Splint Location: rue Ortho Device/Splint Interventions: Ordered, Application, Adjustment   Post Interventions Patient Tolerated: Well Instructions Provided: Care of device, Adjustment of device   Karolee Stamps 07/07/2018, 2:21 AM

## 2018-07-07 NOTE — ED Notes (Signed)
Ortho tech at bedside, pt unable to tolerate any manipulation of hand/wrist caused excruciating pain, pt premedicated with percocet, will reattempt after pain meds have a chance to be affective.

## 2018-07-09 DIAGNOSIS — S52501A Unspecified fracture of the lower end of right radius, initial encounter for closed fracture: Secondary | ICD-10-CM | POA: Insufficient documentation

## 2018-07-15 DIAGNOSIS — J301 Allergic rhinitis due to pollen: Secondary | ICD-10-CM | POA: Diagnosis not present

## 2018-07-17 DIAGNOSIS — J301 Allergic rhinitis due to pollen: Secondary | ICD-10-CM | POA: Diagnosis not present

## 2018-07-20 DIAGNOSIS — J301 Allergic rhinitis due to pollen: Secondary | ICD-10-CM | POA: Diagnosis not present

## 2018-07-22 DIAGNOSIS — J301 Allergic rhinitis due to pollen: Secondary | ICD-10-CM | POA: Diagnosis not present

## 2018-07-27 ENCOUNTER — Other Ambulatory Visit: Payer: Self-pay | Admitting: Internal Medicine

## 2018-07-27 DIAGNOSIS — J301 Allergic rhinitis due to pollen: Secondary | ICD-10-CM | POA: Diagnosis not present

## 2018-07-30 DIAGNOSIS — J301 Allergic rhinitis due to pollen: Secondary | ICD-10-CM | POA: Diagnosis not present

## 2018-08-04 NOTE — Progress Notes (Signed)
FOLLOW UP  Assessment and Plan:   Essential hypertension - continue medications, DASH diet, exercise and monitor at home. Call if greater than 130/80.  -     CBC with Differential/Platelet -     COMPLETE METABOLIC PANEL WITH GFR -     TSH  Vitamin D deficiency Continue supplement  Hyperlipidemia, unspecified hyperlipidemia type check lipids decrease fatty foods increase activity.  She is on crestor now, recheck -     Lipid panel  Major depressive disorder, recurrent, severe without psychotic features ([place]) Continue medications  Abnormal glucose -     Hemoglobin A1c Discussed disease progression and risks Discussed diet/exercise, weight management and risk modification     Continue diet and meds as discussed. Further disposition pending results of labs. Over 30 minutes of exam, counseling, chart review, and critical decision making was performed  Future Appointments  Date Time Provider [place]  11/09/2018 10:00 AM Unk Pinto, MD [facility] None     HPI 62 y.o. female  presents for 3 month follow up on hypertension, cholesterol, prediabetes, and vitamin D deficiency.   Her blood pressure has been controlled at home, today their BP is BP: (!) 148/90, states BP at other doctors has been good.  BP Readings from Last 3 Encounters:  08/06/18 (!) 148/90  07/07/18 134/87  05/06/18 122/80    She does not workout. She denies chest pain, shortness of breath, dizziness.  She is following with Dr. Donneta Romberg and she has quit school and states her lungs are better since doing this.    She  is  on cholesterol medication, on crestor 40mg  and denies myalgias. Her cholesterol is not at goal. The cholesterol last visit was:   Lab Results  Component Value Date   CHOL 275 (H) 04/20/2018   HDL 74 04/20/2018   LDLCALC 176 (H) 04/20/2018   TRIG 121 04/20/2018   CHOLHDL 3.7 04/20/2018    She has been working on diet and exercise for prediabetes, and denies paresthesia of  the feet, polydipsia, polyuria and visual disturbances. Last A1C in the office was:  Lab Results  Component Value Date   HGBA1C 5.6 04/20/2018    Patient is on Vitamin D supplement.   Lab Results  Component Value Date   VD25OH 37 04/20/2018     BMI is Body mass index is 39.15 kg/m., she is working on diet and exercise. Wt Readings from Last 3 Encounters:  08/06/18 221 lb (100.2 kg)  07/06/18 200 lb (90.7 kg)  05/06/18 218 lb 14.4 oz (99.3 kg)     Current Medications:  Current Outpatient Medications on File Prior to Visit  Medication Sig  . albuterol (VENTOLIN HFA) 108 (90 Base) MCG/ACT inhaler Inhale 2 puffs into the lungs every 4 (four) hours as needed for wheezing or shortness of breath.  . dexamethasone (DECADRON) 1 MG tablet Take 1 tab 3 x day - 3 days, then 2 x day - 3 days, then 1 tab daily  . FLUoxetine (PROZAC) 20 MG capsule Take 1 capsule (20 mg total) by mouth daily. For depression  . fluticasone (FLONASE) 50 MCG/ACT nasal spray SHAKE LQ AND U 1 TO 2 SPRAYS IEN QD  . guaiFENesin-codeine 100-10 MG/5ML syrup Take 10 mLs by mouth 3 (three) times daily as needed for cough.  Marland Kitchen ibuprofen (ADVIL,MOTRIN) 200 MG tablet Take 600 mg by mouth every 6 (six) hours as needed for headache.  . levocetirizine (XYZAL) 5 MG tablet TK 1 T PO QD IN THE EVE  .  methylphenidate (RITALIN) 10 MG tablet TK 1 T PO BID  . naproxen (NAPROSYN) 500 MG tablet Take 1 tablet (500 mg total) by mouth every 12 (twelve) hours as needed for mild pain or moderate pain.  Marland Kitchen oxyCODONE-acetaminophen (PERCOCET/ROXICET) 5-325 MG tablet Take 1-2 tablets by mouth every 6 (six) hours as needed for moderate pain or severe pain.  . rosuvastatin (CRESTOR) 40 MG tablet TAKE 1/2 TO 1 TABLET BY MOUTH DAILY OR AS DIRECTED FOR CHOLESTEROL  . SYMBICORT 160-4.5 MCG/ACT inhaler INL 2 PFS PO BID  . montelukast (SINGULAIR) 10 MG tablet Take 1 tablet daily for allergies/Asthma   No current facility-administered medications on file  prior to visit.     Medical History:  Past Medical History:  Diagnosis Date  . Anxiety   . Arthritis   . Asthma   . Cataract   . Gout   . Hypertension   . Obese    Allergies:  Allergies  Allergen Reactions  . Biaxin [Clarithromycin]     GI upset  . Serevent [Salmeterol]     Palpitations  . Tequin [Gatifloxacin]     GI upset. thrush     Review of Systems:  Review of Systems  Constitutional: Negative.   HENT: Negative.   Eyes: Negative.   Respiratory: Negative.   Cardiovascular: Negative.   Gastrointestinal: Negative.   Genitourinary: Negative.   Musculoskeletal: Negative.   Skin: Negative.     Family history- Review and unchanged Social history- Review and unchanged Physical Exam: BP (!) 148/90   Pulse 91   Temp (!) 97.5 F (36.4 C)   Ht 5\' 3"  (1.6 m)   Wt 221 lb (100.2 kg)   LMP 11/26/2011   SpO2 98%   BMI 39.15 kg/m  Wt Readings from Last 3 Encounters:  08/06/18 221 lb (100.2 kg)  07/06/18 200 lb (90.7 kg)  05/06/18 218 lb 14.4 oz (99.3 kg)   General Appearance: Well nourished, in no apparent distress. Eyes: PERRLA, EOMs, conjunctiva no swelling or erythema Sinuses: No Frontal/maxillary tenderness ENT/Mouth: Ext aud canals clear, TMs without erythema, bulging. No erythema, swelling, or exudate on post pharynx.  Tonsils not swollen or erythematous. Hearing normal.  Neck: Supple, thyroid normal.  Respiratory: Respiratory effort normal, BS equal bilaterally without rales, rhonchi, wheezing or stridor.  Cardio: RRR with no MRGs. Brisk peripheral pulses without edema.  Abdomen: Soft, + BS,  Non tender, no guarding, rebound, hernias, masses. Lymphatics: Non tender without lymphadenopathy.  Musculoskeletal: Full ROM, 5/5 strength, Normal gait, right arm in cast Skin: Warm, dry without rashes, lesions, ecchymosis.  Neuro: Cranial nerves intact. Normal muscle tone, no cerebellar symptoms. Psych: Awake and oriented X 3, normal affect, Insight and Judgment  appropriate.    Vicie Mutters, PA-C 11:17 AM [facility]

## 2018-08-05 DIAGNOSIS — J301 Allergic rhinitis due to pollen: Secondary | ICD-10-CM | POA: Diagnosis not present

## 2018-08-06 ENCOUNTER — Ambulatory Visit (INDEPENDENT_AMBULATORY_CARE_PROVIDER_SITE_OTHER): Payer: Self-pay | Admitting: Physician Assistant

## 2018-08-06 ENCOUNTER — Encounter: Payer: Self-pay | Admitting: Physician Assistant

## 2018-08-06 VITALS — BP 140/90 | HR 91 | Temp 97.5°F | Ht 63.0 in | Wt 221.0 lb

## 2018-08-06 DIAGNOSIS — R7309 Other abnormal glucose: Secondary | ICD-10-CM

## 2018-08-06 DIAGNOSIS — E559 Vitamin D deficiency, unspecified: Secondary | ICD-10-CM

## 2018-08-06 DIAGNOSIS — E785 Hyperlipidemia, unspecified: Secondary | ICD-10-CM

## 2018-08-06 DIAGNOSIS — I10 Essential (primary) hypertension: Secondary | ICD-10-CM

## 2018-08-06 DIAGNOSIS — F332 Major depressive disorder, recurrent severe without psychotic features: Secondary | ICD-10-CM

## 2018-08-06 DIAGNOSIS — Z79899 Other long term (current) drug therapy: Secondary | ICD-10-CM

## 2018-08-06 NOTE — Patient Instructions (Signed)
HYPERTENSION INFORMATION  Monitor your blood pressure at home, please keep a record and bring that in with you to your next office visit.   Go to the ER if any CP, SOB, nausea, dizziness, severe HA, changes vision/speech  Due to a recent study, SPRINT, we have changed our goal for the systolic or top blood pressure number. Ideally we want your top number at 120.  In the [facility] Trial, 5000 people were randomized to a goal BP of 120 and 5000 people were randomized to a goal BP of less than 140. The patients with the goal BP at 120 had LESS DEMENTIA, LESS HEART ATTACKS, AND LESS STROKES, AS WELL AS OVERALL DECREASED MORTALITY OR DEATH RATE.   If you are willing, our goal BP is the top number of 120.  Your most recent BP: BP: (!) 148/90   Take your medications faithfully as instructed. Maintain a healthy weight. Get at least 150 minutes of aerobic exercise per week. Minimize salt intake. Minimize alcohol intake  DASH Eating Plan DASH stands for "Dietary Approaches to Stop Hypertension." The DASH eating plan is a healthy eating plan that has been shown to reduce high blood pressure (hypertension). Additional health benefits may include reducing the risk of type 2 diabetes mellitus, heart disease, and stroke. The DASH eating plan may also help with weight loss. WHAT DO I NEED TO KNOW ABOUT THE DASH EATING PLAN? For the DASH eating plan, you will follow these general guidelines:  Choose foods with a percent daily value for sodium of less than 5% (as listed on the food label).  Use salt-free seasonings or herbs instead of table salt or sea salt.  Check with your health care provider or pharmacist before using salt substitutes.  Eat lower-sodium products, often labeled as "lower sodium" or "no salt added."  Eat fresh foods.  Eat more vegetables, fruits, and low-fat dairy products.  Choose whole grains. Look for the word "whole" as the first word in the ingredient list.  Choose fish and  skinless chicken or [place] more often than red meat. Limit fish, poultry, and meat to 6 oz (170 g) each day.  Limit sweets, desserts, sugars, and sugary drinks.  Choose heart-healthy fats.  Limit cheese to 1 oz (28 g) per day.  Eat more home-cooked food and less restaurant, buffet, and fast food.  Limit fried foods.  Cook foods using methods other than frying.  Limit canned vegetables. If you do use them, rinse them well to decrease the sodium.  When eating at a restaurant, ask that your food be prepared with less salt, or no salt if possible. WHAT FOODS CAN I EAT? Seek help from a dietitian for individual calorie needs. Grains Whole grain or whole wheat bread. Brown rice. Whole grain or whole wheat pasta. Quinoa, bulgur, and whole grain cereals. Low-sodium cereals. Corn or whole wheat flour tortillas. Whole grain cornbread. Whole grain crackers. Low-sodium crackers. Vegetables Fresh or frozen vegetables (raw, steamed, roasted, or grilled). Low-sodium or reduced-sodium tomato and vegetable juices. Low-sodium or reduced-sodium tomato sauce and paste. Low-sodium or reduced-sodium canned vegetables.  Fruits All fresh, canned (in natural juice), or frozen fruits. Meat and Other Protein Products Ground beef (85% or leaner), grass-fed beef, or beef trimmed of fat. Skinless chicken or [place]. Ground chicken or [place]. Pork trimmed of fat. All fish and seafood. Eggs. Dried beans, peas, or lentils. Unsalted nuts and seeds. Unsalted canned beans. Dairy Low-fat dairy products, such as skim or 1% milk, 2% or reduced-fat  cheeses, low-fat ricotta or cottage cheese, or plain low-fat yogurt. Low-sodium or reduced-sodium cheeses. Fats and Oils Tub margarines without trans fats. Light or reduced-fat mayonnaise and salad dressings (reduced sodium). Avocado. Safflower, olive, or canola oils. Natural peanut or almond butter. Other Unsalted popcorn and pretzels. The items listed above may not be a  complete list of recommended foods or beverages. Contact your dietitian for more options. WHAT FOODS ARE NOT RECOMMENDED? Grains White bread. White pasta. White rice. Refined cornbread. Bagels and croissants. Crackers that contain trans fat. Vegetables Creamed or fried vegetables. Vegetables in a cheese sauce. Regular canned vegetables. Regular canned tomato sauce and paste. Regular tomato and vegetable juices. Fruits Dried fruits. Canned fruit in light or heavy syrup. Fruit juice. Meat and Other Protein Products Fatty cuts of meat. Ribs, chicken wings, bacon, sausage, bologna, salami, chitterlings, fatback, hot dogs, bratwurst, and packaged luncheon meats. Salted nuts and seeds. Canned beans with salt. Dairy Whole or 2% milk, cream, half-and-half, and cream cheese. Whole-fat or sweetened yogurt. Full-fat cheeses or blue cheese. Nondairy creamers and whipped toppings. Processed cheese, cheese spreads, or cheese curds. Condiments Onion and garlic salt, seasoned salt, table salt, and sea salt. Canned and packaged gravies. Worcestershire sauce. Tartar sauce. Barbecue sauce. Teriyaki sauce. Soy sauce, including reduced sodium. Steak sauce. Fish sauce. Oyster sauce. Cocktail sauce. Horseradish. Ketchup and mustard. Meat flavorings and tenderizers. Bouillon cubes. Hot sauce. Tabasco sauce. Marinades. Taco seasonings. Relishes. Fats and Oils Butter, stick margarine, lard, shortening, ghee, and bacon fat. Coconut, palm kernel, or palm oils. Regular salad dressings. Other Pickles and olives. Salted popcorn and pretzels. The items listed above may not be a complete list of foods and beverages to avoid. Contact your dietitian for more information. WHERE CAN I FIND MORE INFORMATION? National Heart, Lung, and [employer] Institute: travelstabloid.com Document Released: 10/10/2011 Document Revised: 03/07/2014 Document Reviewed: 08/25/2013 [facility] Patient Information 2015  [place], [place]. This information is not intended to replace advice given to you by your health care provider. Make sure you discuss any questions you have with your health care provider.      When it comes to diets, agreement about the perfect plan isn't easy to find, even among the experts. Experts at the [employer] developed an idea known as the Healthy Eating Plate. Just imagine a plate divided into logical, healthy portions.  The emphasis is on diet quality:  Load up on vegetables and fruits - one-half of your plate: Aim for color and variety, and remember that potatoes don't count.  Go for whole grains - one-quarter of your plate: Whole wheat, barley, wheat berries, quinoa, oats, brown rice, and foods made with them. If you want pasta, go with whole wheat pasta.  Protein power - one-quarter of your plate: Fish, chicken, beans, and nuts are all healthy, versatile protein sources. Limit red meat.  The diet, however, does go beyond the plate, offering a few other suggestions.  Use healthy plant oils, such as olive, canola, soy, corn, sunflower and peanut. Check the labels, and avoid partially hydrogenated oil, which have unhealthy trans fats.  If you're thirsty, drink water. Coffee and tea are good in moderation, but skip sugary drinks and limit milk and dairy products to one or two daily servings.  The type of carbohydrate in the diet is more important than the amount. Some sources of carbohydrates, such as vegetables, fruits, whole grains, and beans-are healthier than others.  Finally, stay active.

## 2018-08-07 LAB — CBC WITH DIFFERENTIAL/PLATELET
BASOS PCT: 0.7 %
Basophils Absolute: 52 cells/uL (ref 0–200)
Eosinophils Absolute: 363 cells/uL (ref 15–500)
Eosinophils Relative: 4.9 %
HCT: 40.9 % (ref 35.0–45.0)
HEMOGLOBIN: 13.6 g/dL (ref 11.7–15.5)
Lymphs Abs: 2657 cells/uL (ref 850–3900)
MCH: 31.3 pg (ref 27.0–33.0)
MCHC: 33.3 g/dL (ref 32.0–36.0)
MCV: 94 fL (ref 80.0–100.0)
MPV: 10.6 fL (ref 7.5–12.5)
Monocytes Relative: 7.7 %
NEUTROS ABS: 3759 {cells}/uL (ref 1500–7800)
Neutrophils Relative %: 50.8 %
PLATELETS: 290 10*3/uL (ref 140–400)
RBC: 4.35 10*6/uL (ref 3.80–5.10)
RDW: 11.6 % (ref 11.0–15.0)
TOTAL LYMPHOCYTE: 35.9 %
WBC: 7.4 10*3/uL (ref 3.8–10.8)
WBCMIX: 570 {cells}/uL (ref 200–950)

## 2018-08-07 LAB — COMPLETE METABOLIC PANEL WITH GFR
AG Ratio: 1.8 (calc) (ref 1.0–2.5)
ALKALINE PHOSPHATASE (APISO): 77 U/L (ref 33–130)
ALT: 21 U/L (ref 6–29)
AST: 20 U/L (ref 10–35)
Albumin: 4.5 g/dL (ref 3.6–5.1)
BUN: 15 mg/dL (ref 7–25)
CALCIUM: 9.7 mg/dL (ref 8.6–10.4)
CHLORIDE: 103 mmol/L (ref 98–110)
CO2: 27 mmol/L (ref 20–32)
CREATININE: 0.81 mg/dL (ref 0.50–0.99)
GFR, EST NON AFRICAN AMERICAN: 78 mL/min/{1.73_m2} (ref >=60)
GFR, Est African American: 91 mL/min/{1.73_m2} (ref >=60)
GLUCOSE: 105 mg/dL — AB (ref 65–99)
Globulin: 2.5 g/dL (calc) (ref 1.9–3.7)
Potassium: 4.8 mmol/L (ref 3.5–5.3)
Sodium: 139 mmol/L (ref 135–146)
Total Bilirubin: 1 mg/dL (ref 0.2–1.2)
Total Protein: 7 g/dL (ref 6.1–8.1)

## 2018-08-07 LAB — LIPID PANEL
CHOL/HDL RATIO: 3.2 (calc) (ref <5.0)
CHOLESTEROL: 227 mg/dL — AB (ref <200)
HDL: 72 mg/dL (ref >50)
LDL CHOLESTEROL (CALC): 126 mg/dL — AB
Non-HDL Cholesterol (Calc): 155 mg/dL (calc) — ABNORMAL HIGH (ref <130)
TRIGLYCERIDES: 169 mg/dL — AB (ref <150)

## 2018-08-07 LAB — HEMOGLOBIN A1C
Hgb A1c MFr Bld: 5.8 % of total Hgb — ABNORMAL HIGH (ref <5.7)
MEAN PLASMA GLUCOSE: 120 (calc)
eAG (mmol/L): 6.6 (calc)

## 2018-08-07 LAB — TSH: TSH: 0.87 mIU/L (ref 0.40–4.50)

## 2018-08-24 ENCOUNTER — Other Ambulatory Visit: Payer: Self-pay | Admitting: Internal Medicine

## 2018-10-14 DIAGNOSIS — F3342 Major depressive disorder, recurrent, in full remission: Secondary | ICD-10-CM | POA: Diagnosis not present

## 2018-10-14 DIAGNOSIS — G5620 Lesion of ulnar nerve, unspecified upper limb: Secondary | ICD-10-CM | POA: Insufficient documentation

## 2018-10-14 DIAGNOSIS — F900 Attention-deficit hyperactivity disorder, predominantly inattentive type: Secondary | ICD-10-CM | POA: Diagnosis not present

## 2018-11-08 ENCOUNTER — Encounter: Payer: Self-pay | Admitting: Internal Medicine

## 2018-11-08 NOTE — Progress Notes (Signed)
                                                                                                                                                                                                                                   C  A  N  C  E  L  L  E  D  The  MORNING    of      APP'T                      This very nice 63 y.o. [name]  presents for a Screening /Preventative Visit & comprehensive evaluation and management of multiple medical co-morbidities.  Patient has been followed for HTN, HLD, Morbid Obesity, Prediabetes  and Vitamin D Deficiency. 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       HTN predates since 2008. Patient's BP has been controlled at home and patient denies any cardiac symptoms as chest pain, palpitations, shortness of breath, dizziness or ankle swelling. Today's        Patient's hyperlipidemia is controlled with diet and medications. Patient denies myalgias or other medication SE's. Last lipids were  Lab Results  Component Value Date   CHOL 227 (H) 08/06/2018   HDL 72 08/06/2018   LDLCALC 126 (H) 08/06/2018   TRIG 169 (H) 08/06/2018   CHOLHDL 3.2 08/06/2018      Patient has Morbid Obesity (BMI 39+) and hx/o prediabetes (5.9% / 2014) and patient denies reactive hypoglycemic symptoms, visual blurring, diabetic polys or paresthesias. Last A1c was not at goal: Lab Results  Component Value Date   HGBA1C 5.8 (H) 08/06/2018      Finally, patient has history of Vitamin D Deficiency ("20" / 2010)  and last Vitamin D was still very low: Lab Results  Component Value Date   VD25OH 37 04/20/2018

## 2018-11-09 ENCOUNTER — Ambulatory Visit: Payer: Self-pay | Admitting: Internal Medicine

## 2019-01-02 ENCOUNTER — Encounter: Payer: Self-pay | Admitting: Gastroenterology

## 2019-01-20 IMAGING — CR RIGHT FEMUR 2 VIEWS
4 series · 4 of 4 positions shown · non-contrast
Comparison: None.

CLINICAL DATA: Fall last night with right hip pain.

EXAM:
RIGHT FEMUR 2 VIEWS

[femur ap (1 of 2)]
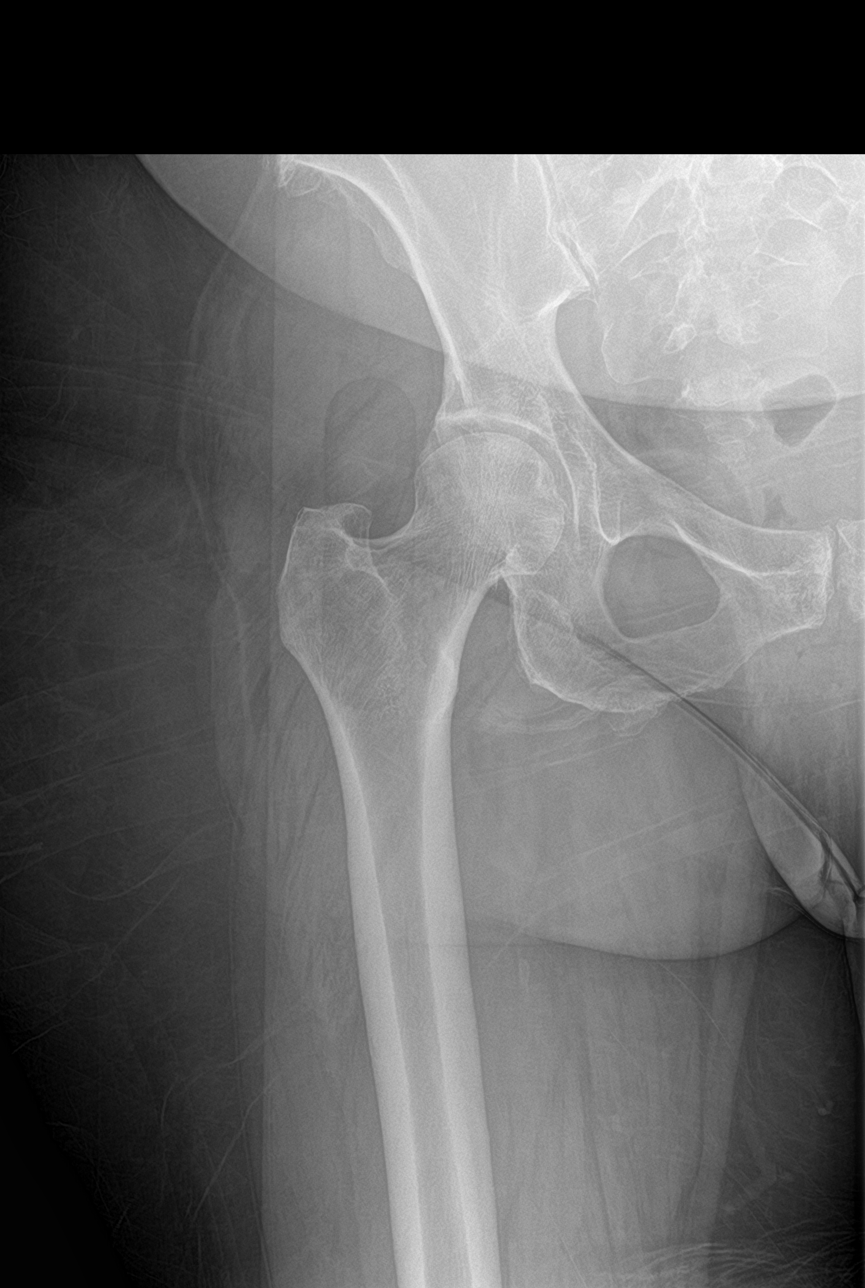

[femur ap (2 of 2)]
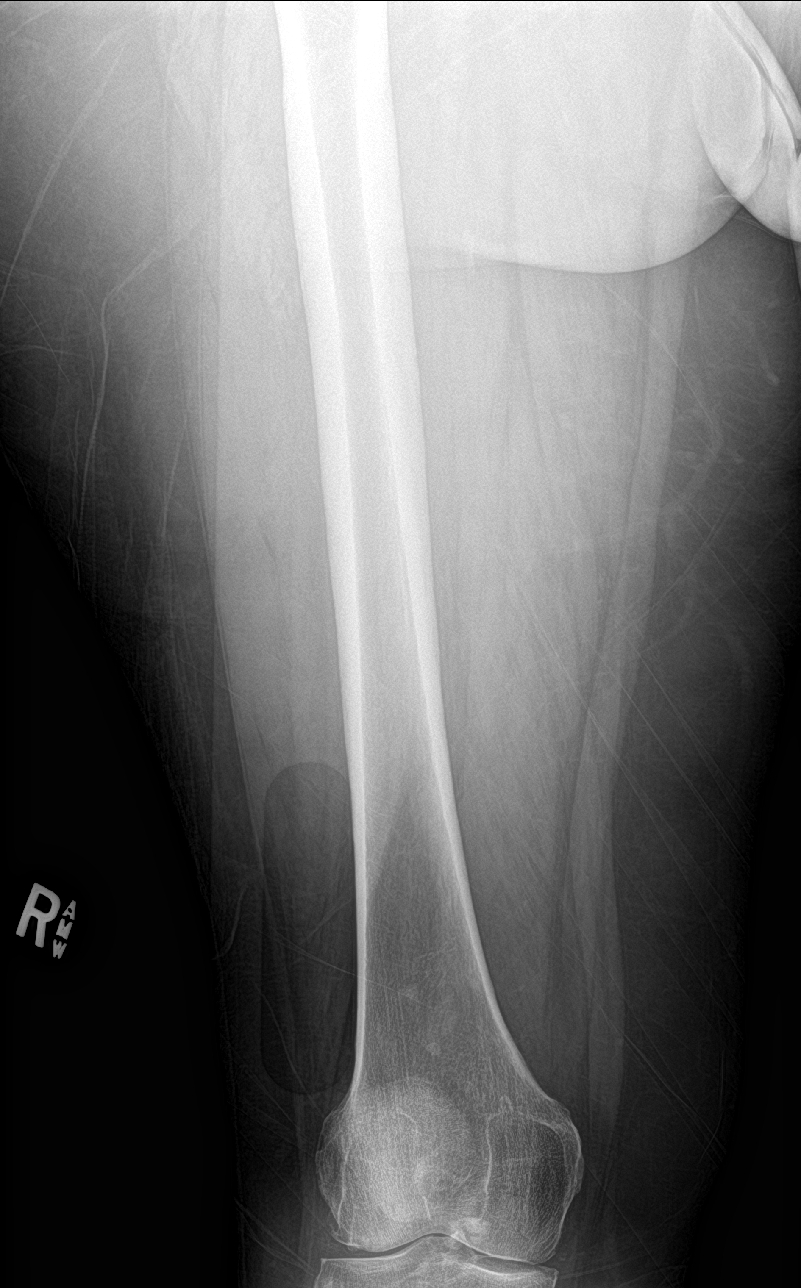

[femur lat (1 of 2)]
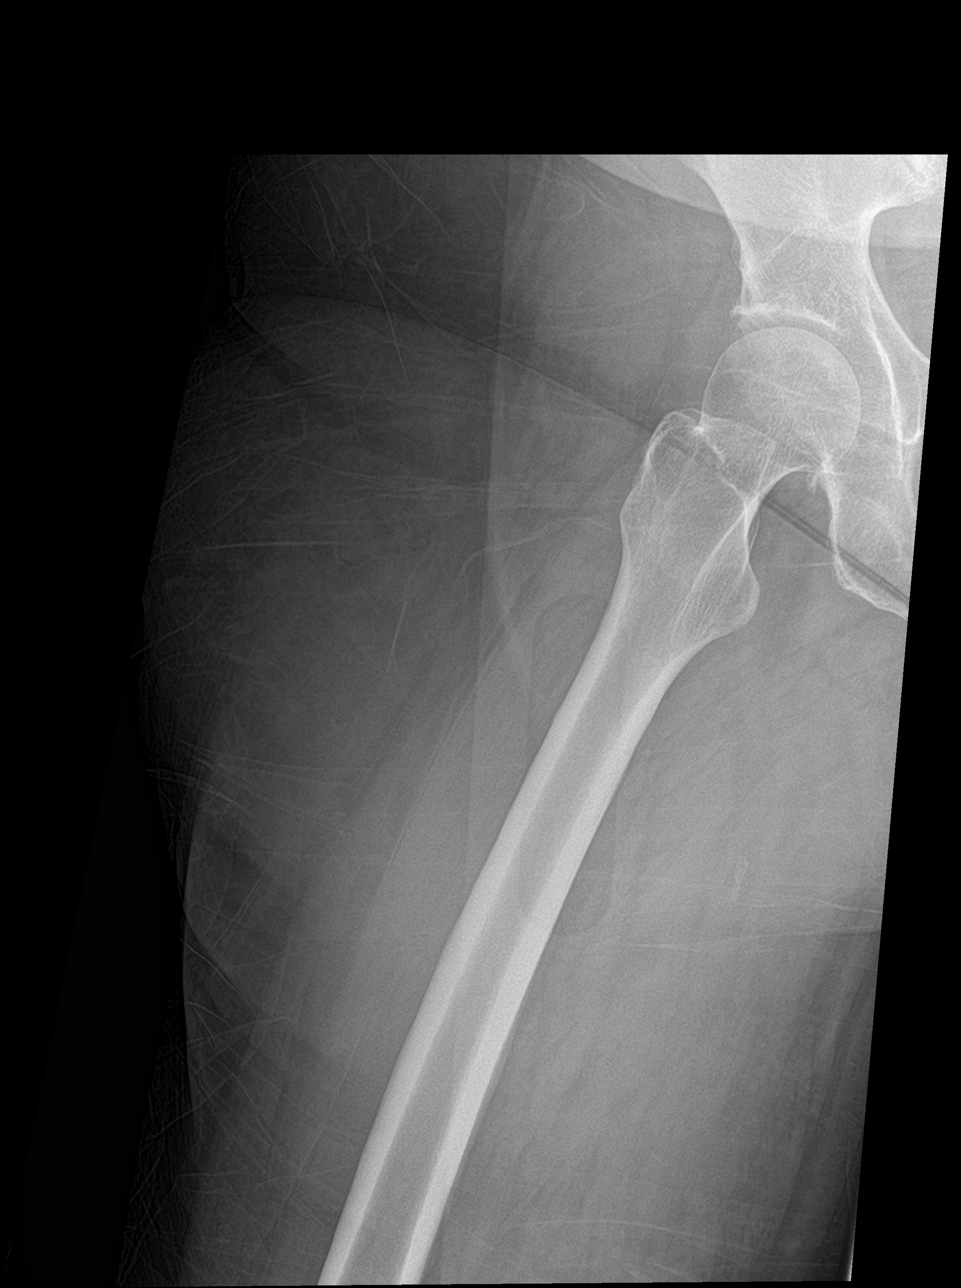

[femur lat (2 of 2)]
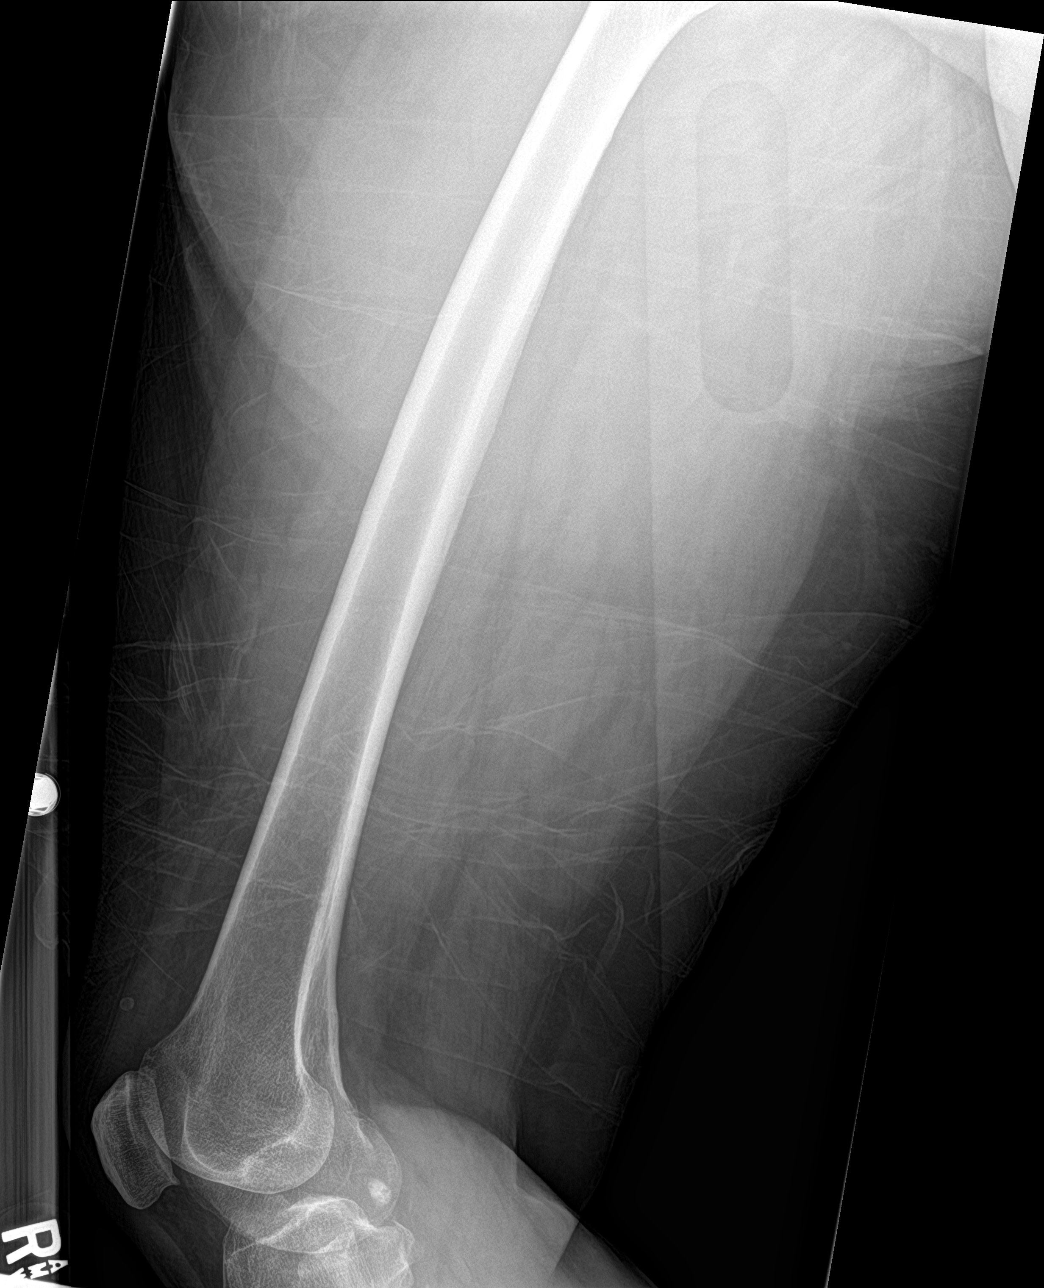

[4 of 4 positions shown; findings below may reference images not displayed]

FINDINGS: There is no evidence of fracture or other focal bone lesions. Soft
tissues are unremarkable. Degenerative spurring at the knee.
IMPRESSION: Negative for fracture.

## 2019-03-07 ENCOUNTER — Emergency Department (HOSPITAL_COMMUNITY)
Admission: EM | Admit: 2019-03-07 | Discharge: 2019-03-07 | Disposition: A | Discharge status: Home / Self Care | Payer: Self-pay | Attending: Emergency Medicine | Admitting: Emergency Medicine

## 2019-03-07 ENCOUNTER — Encounter (HOSPITAL_COMMUNITY): Payer: Self-pay

## 2019-03-07 ENCOUNTER — Emergency Department (HOSPITAL_COMMUNITY): Payer: Self-pay

## 2019-03-07 ENCOUNTER — Other Ambulatory Visit: Payer: Self-pay

## 2019-03-07 DIAGNOSIS — Z87891 Personal history of nicotine dependence: Secondary | ICD-10-CM | POA: Insufficient documentation

## 2019-03-07 DIAGNOSIS — J45909 Unspecified asthma, uncomplicated: Secondary | ICD-10-CM | POA: Insufficient documentation

## 2019-03-07 DIAGNOSIS — M25551 Pain in right hip: Secondary | ICD-10-CM | POA: Insufficient documentation

## 2019-03-07 DIAGNOSIS — I10 Essential (primary) hypertension: Secondary | ICD-10-CM | POA: Diagnosis not present

## 2019-03-07 DIAGNOSIS — Z79899 Other long term (current) drug therapy: Secondary | ICD-10-CM | POA: Insufficient documentation

## 2019-03-07 DIAGNOSIS — M545 Low back pain, unspecified: Secondary | ICD-10-CM

## 2019-03-07 DIAGNOSIS — W19XXXA Unspecified fall, initial encounter: Secondary | ICD-10-CM

## 2019-03-07 DIAGNOSIS — Z1159 Encounter for screening for other viral diseases: Secondary | ICD-10-CM | POA: Insufficient documentation

## 2019-03-07 DIAGNOSIS — Z03818 Encounter for observation for suspected exposure to other biological agents ruled out: Secondary | ICD-10-CM | POA: Diagnosis not present

## 2019-03-07 DIAGNOSIS — M5136 Other intervertebral disc degeneration, lumbar region: Secondary | ICD-10-CM | POA: Insufficient documentation

## 2019-03-07 LAB — BASIC METABOLIC PANEL
Anion gap: 12 (ref 5–15)
BUN: 10 mg/dL (ref 8–23)
CO2: 21 mmol/L — ABNORMAL LOW (ref 22–32)
Calcium: 8.9 mg/dL (ref 8.9–10.3)
Chloride: 109 mmol/L (ref 98–111)
Creatinine, Ser: 0.79 mg/dL (ref 0.44–1.00)
GFR calc Af Amer: >60 mL/min (ref >60)
GFR calc non Af Amer: >60 mL/min (ref >60)
Glucose, Bld: 137 mg/dL — ABNORMAL HIGH (ref 70–99)
Potassium: 4.3 mmol/L (ref 3.5–5.1)
Sodium: 142 mmol/L (ref 135–145)

## 2019-03-07 LAB — CBC WITH DIFFERENTIAL/PLATELET
Abs Immature Granulocytes: 0.04 10*3/uL (ref 0.00–0.07)
Basophils Absolute: 0.1 10*3/uL (ref 0.0–0.1)
Basophils Relative: 1 %
Eosinophils Absolute: 0.2 10*3/uL (ref 0.0–0.5)
Eosinophils Relative: 2 %
HCT: 39.2 % (ref 36.0–46.0)
Hemoglobin: 13 g/dL (ref 12.0–15.0)
Immature Granulocytes: 1 %
Lymphocytes Relative: 28 %
Lymphs Abs: 2.5 10*3/uL (ref 0.7–4.0)
MCH: 31.6 pg (ref 26.0–34.0)
MCHC: 33.2 g/dL (ref 30.0–36.0)
MCV: 95.4 fL (ref 80.0–100.0)
Monocytes Absolute: 0.8 10*3/uL (ref 0.1–1.0)
Monocytes Relative: 9 %
Neutro Abs: 5.2 10*3/uL (ref 1.7–7.7)
Neutrophils Relative %: 59 %
Platelets: 286 10*3/uL (ref 150–400)
RBC: 4.11 MIL/uL (ref 3.87–5.11)
RDW: 12.1 % (ref 11.5–15.5)
WBC: 8.7 10*3/uL (ref 4.0–10.5)
nRBC: 0 % (ref 0.0–0.2)

## 2019-03-07 LAB — TYPE AND SCREEN
ABO/RH(D): A NEG
Antibody Screen: NEGATIVE

## 2019-03-07 LAB — PROTIME-INR
INR: 0.9 (ref 0.8–1.2)
Prothrombin Time: 12.3 seconds (ref 11.4–15.2)

## 2019-03-07 LAB — ABO/RH: ABO/RH(D): A NEG

## 2019-03-07 LAB — SARS CORONAVIRUS 2 BY RT PCR (HOSPITAL ORDER, PERFORMED IN ~~LOC~~ HOSPITAL LAB): SARS Coronavirus 2: NEGATIVE

## 2019-03-07 IMAGING — CT CT OF THE RIGHT HIP WITHOUT CONTRAST
3 series · 16 of 33 positions shown, 19 images · non-contrast
Comparison: None.

CLINICAL DATA: Hip pain status post fall last night

EXAM:
CT OF THE RIGHT HIP WITHOUT CONTRAST
TECHNIQUE: Multidetector CT imaging of the right hip was performed according to
the standard protocol. Multiplanar CT image reconstructions were
also generated.

[Series 5: hip 2.0 st · axial · 0.54mm/px · z∈[+848,+1046]mm · 8 of 119 slices shown, 10 images]
[im 10/119  soft-tissue]
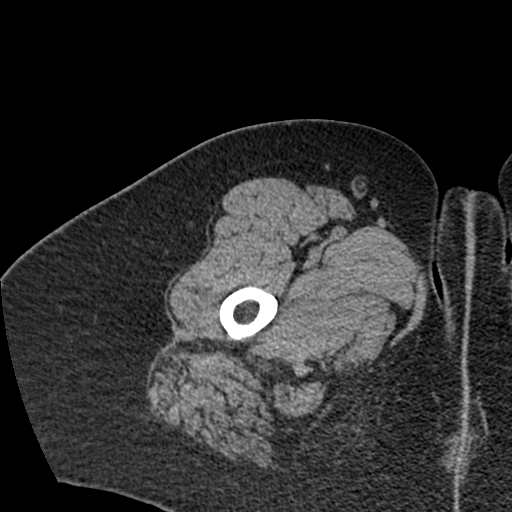
[im 10/119  bone]
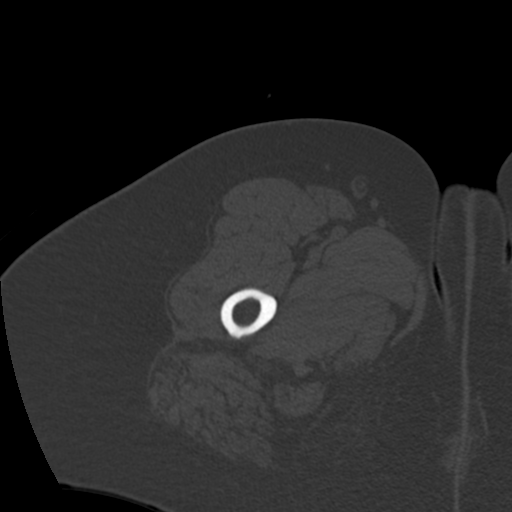
[im 28/119  bone]
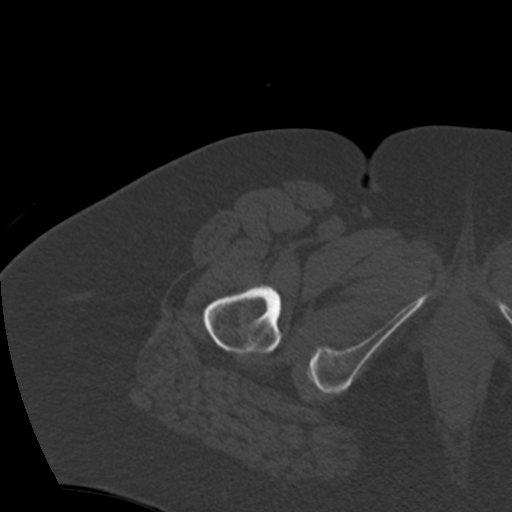
[im 37/119  bone]
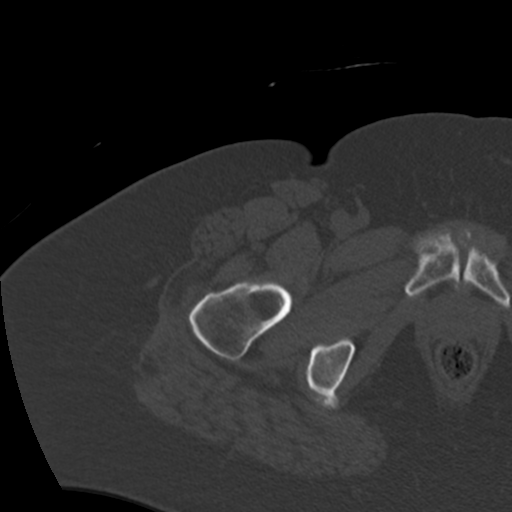
[im 55/119  bone]
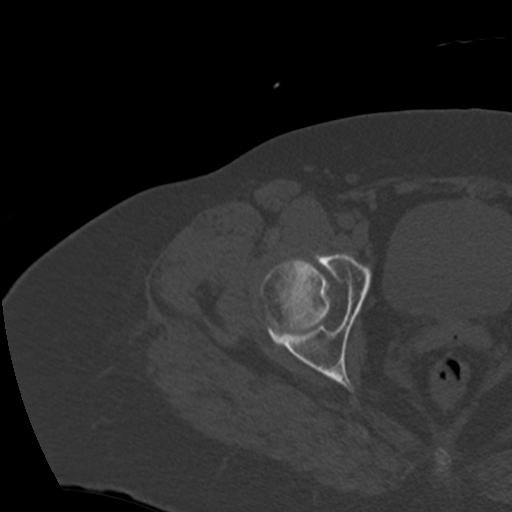
[im 64/119  soft-tissue]
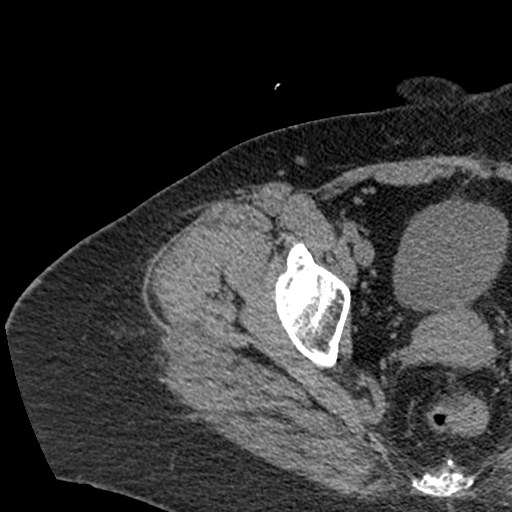
[im 64/119  bone]
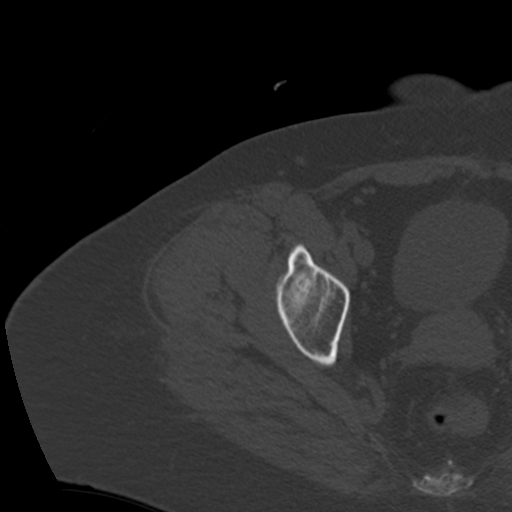
[im 82/119  bone]
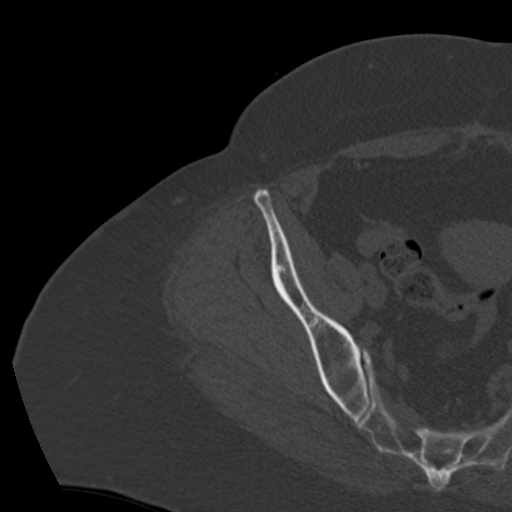
[im 91/119  bone]
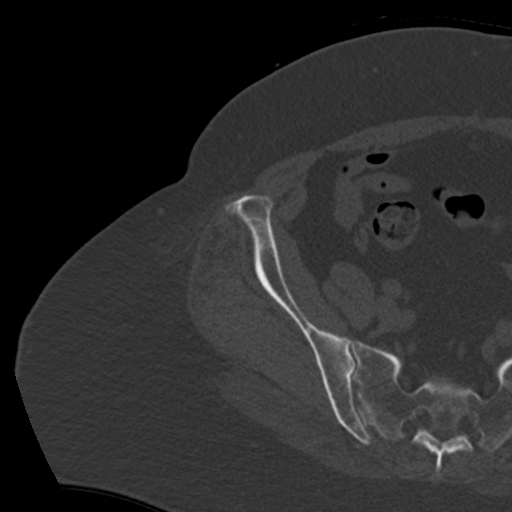
[im 109/119  bone]
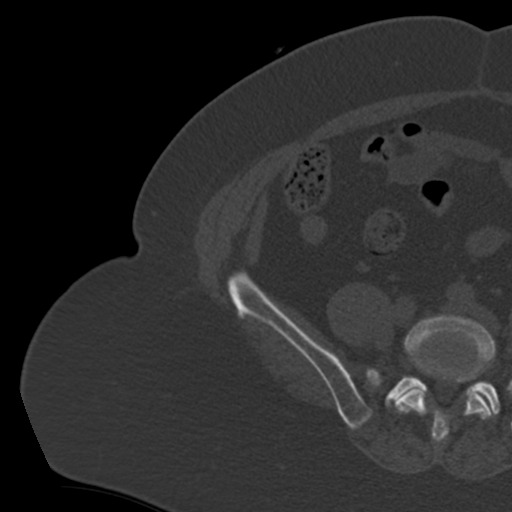

[Series 8: hip 2.0 cor. st · coronal · 0.48mm/px · 3 of 82 slices shown]
[im 17/82  bone]
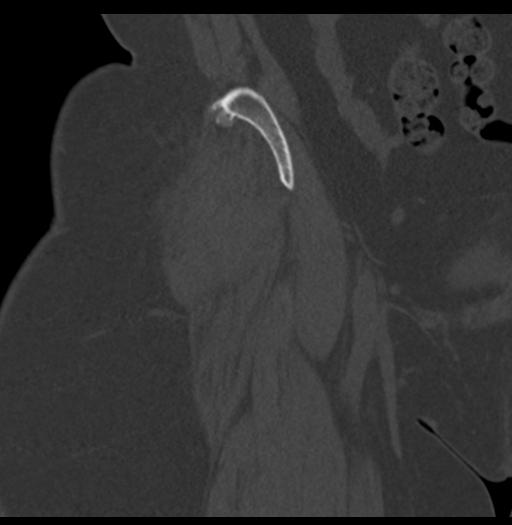
[im 33/82  bone]
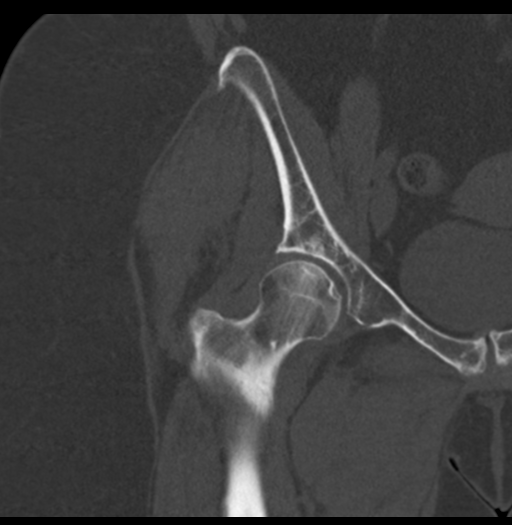
[im 49/82  bone]
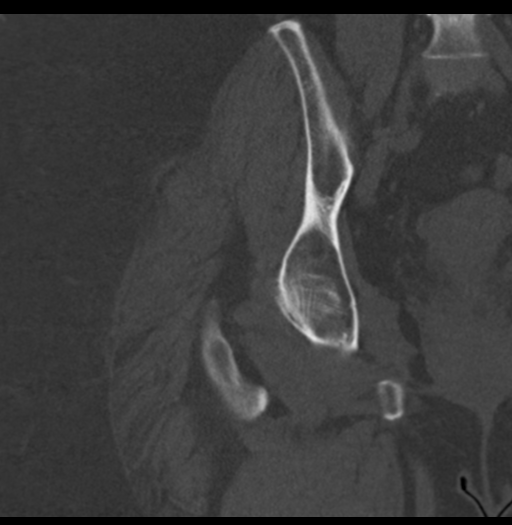

[Series 9: hip 2.0 sag st · sagittal · 0.49mm/px · 5 of 96 slices shown, 6 images]
[im 32/96  bone]
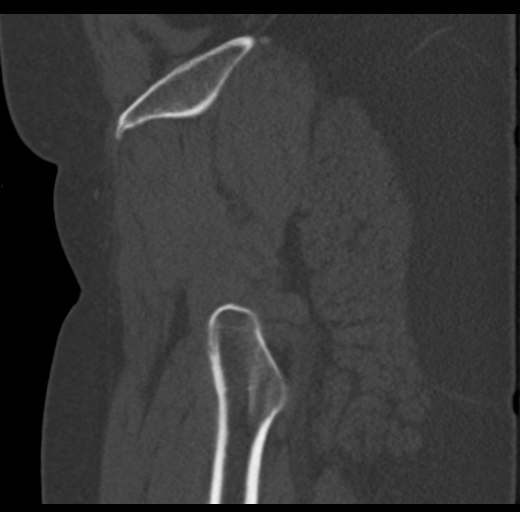
[im 40/96  bone]
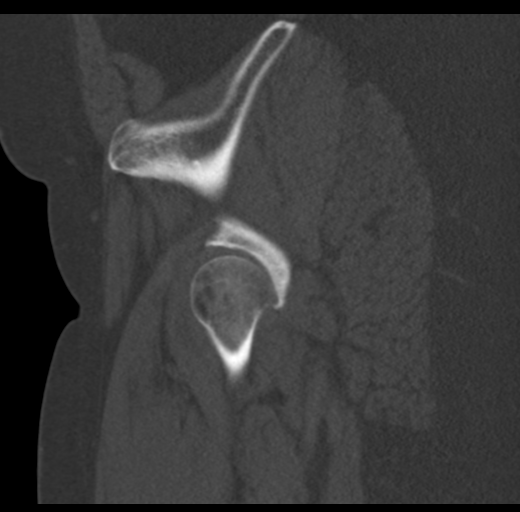
[im 48/96  soft-tissue]
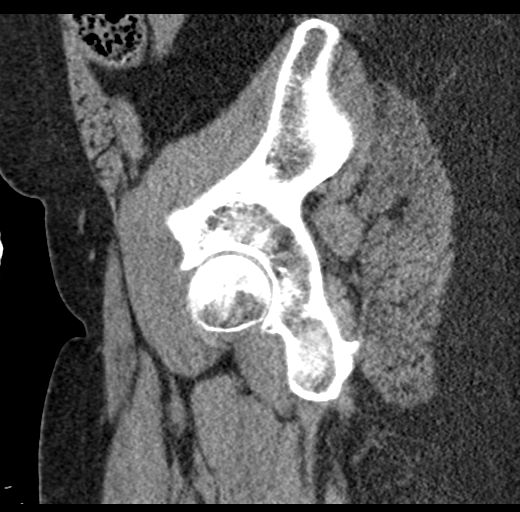
[im 48/96  bone]
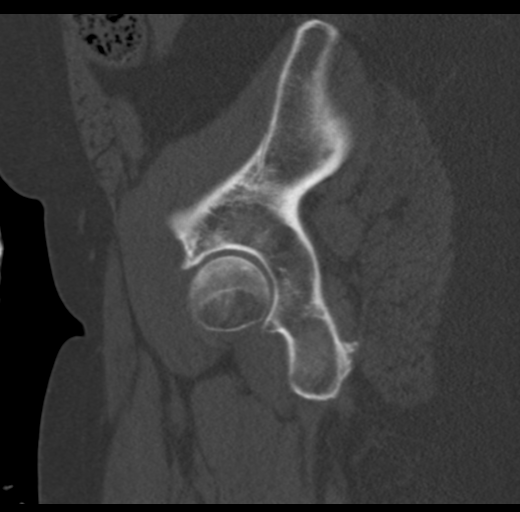
[im 56/96  bone]
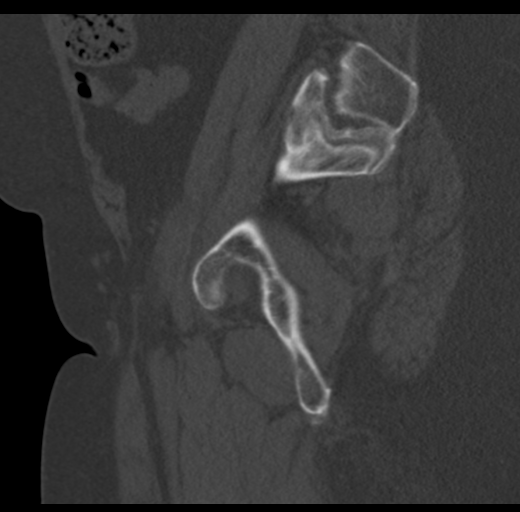
[im 64/96  bone]
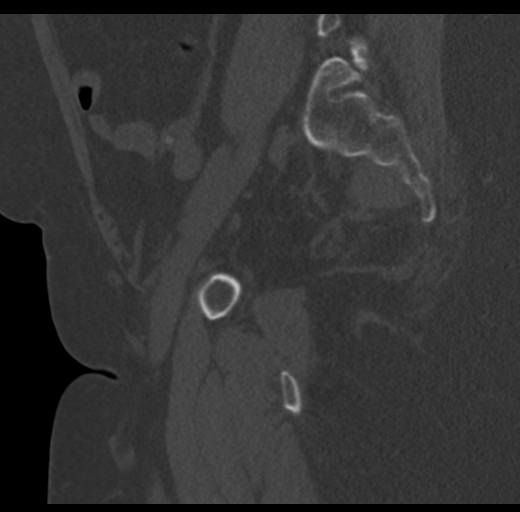

[16 of 33 positions shown; findings below may reference images not displayed]

FINDINGS: Bones/Joint/Cartilage

No fracture or dislocation. Normal alignment. No joint effusion.
Right hip joint space is maintained. Minimal osteoarthritis of the
right SI joint. Bilateral facet arthropathy at L5-S1.

Ligaments

Ligaments are suboptimally evaluated by CT.

Muscles and Tendons
Muscles are normal. No muscle atrophy. No intramuscular fluid
collection or hematoma.

Soft tissue
No fluid collection or hematoma.  No soft tissue mass.
IMPRESSION: 1.  No acute osseous injury of the right hip.

## 2019-03-07 IMAGING — CT CT LUMBAR SPINE WITHOUT CONTRAST
3 series · 14 of 33 positions shown, 17 images · non-contrast
Comparison: Lumbar radiography from earlier today

CLINICAL DATA: Lower back pain

EXAM:
CT LUMBAR SPINE WITHOUT CONTRAST
TECHNIQUE: Multidetector CT imaging of the lumbar spine was performed without
intravenous contrast administration. Multiplanar CT image
reconstructions were also generated.

[Series 4: l-spine 2.0 st · axial · 0.46mm/px · z∈[+1028,+1210]mm · 6 of 120 slices shown, 8 images]
[im 19/120  soft-tissue]
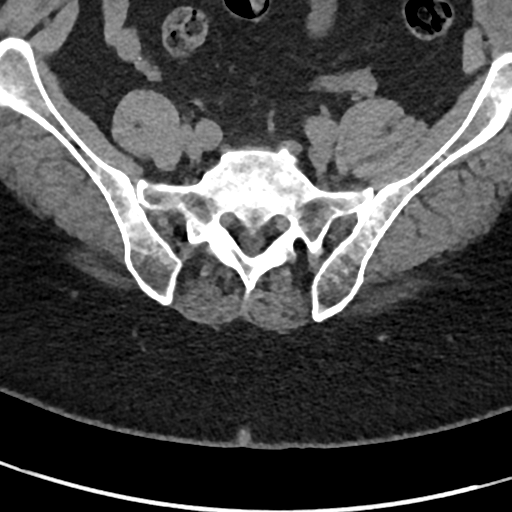
[im 19/120  bone]
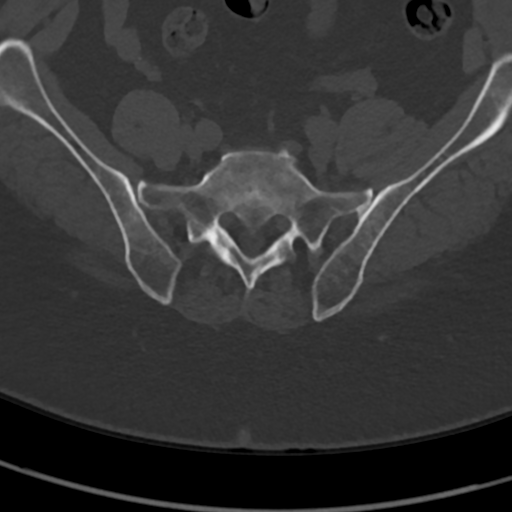
[im 37/120  bone]
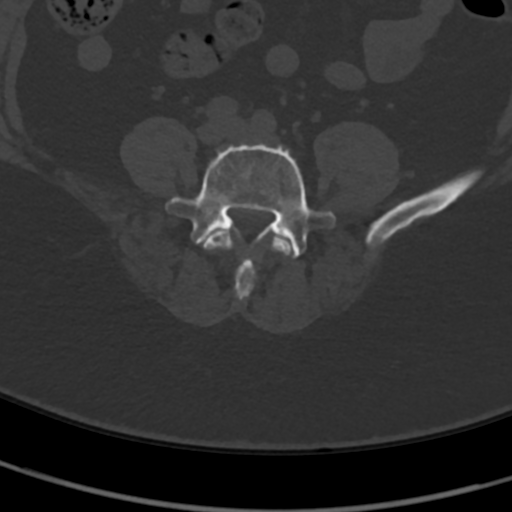
[im 55/120  bone]
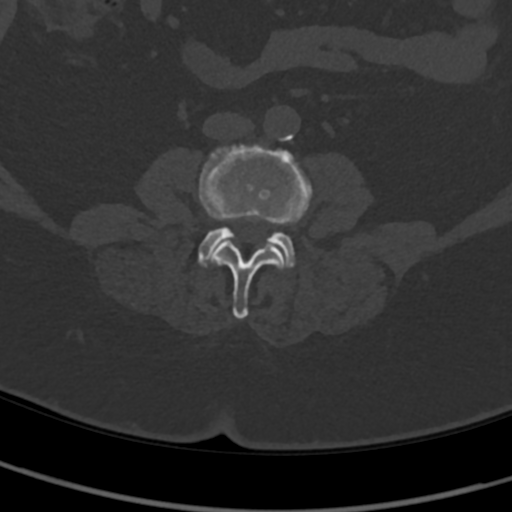
[im 74/120  bone]
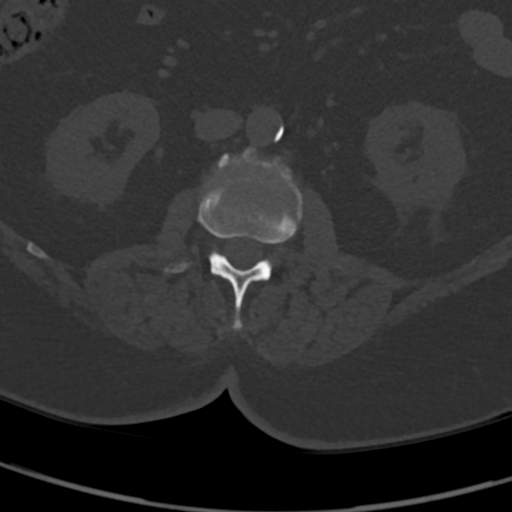
[im 92/120  soft-tissue]
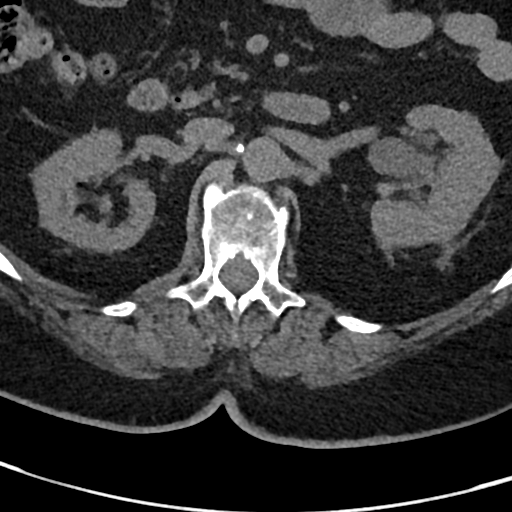
[im 92/120  bone]
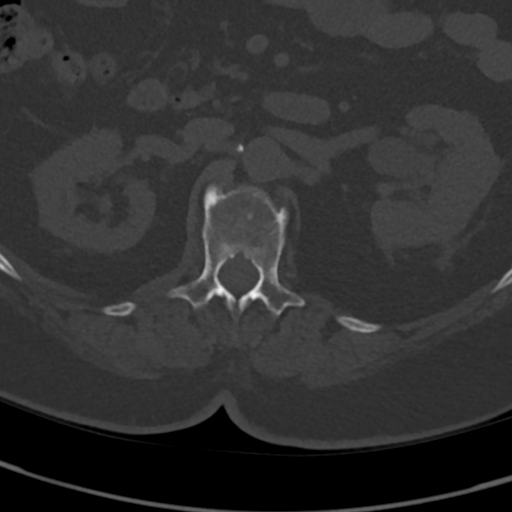
[im 110/120  bone]
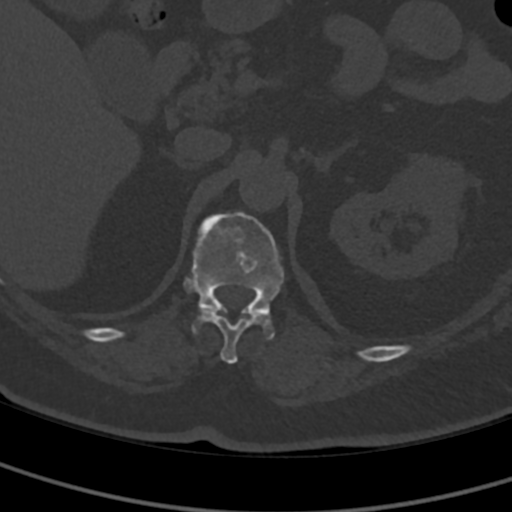

[Series 10: l-spine 2.0 cor · coronal · 0.42mm/px · 3 of 49 slices shown]
[im 10/49  bone]
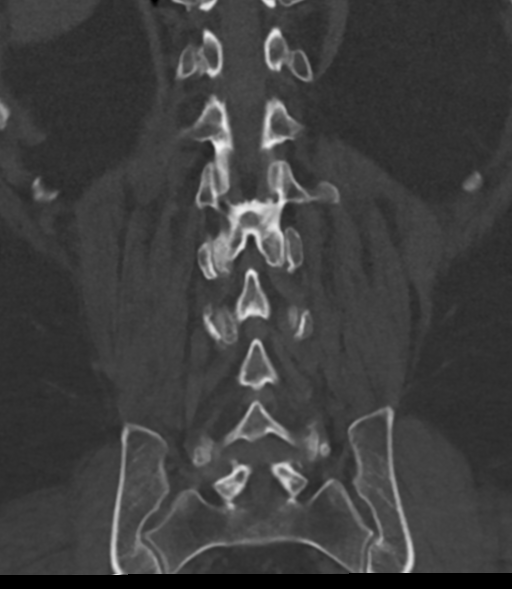
[im 20/49  bone]
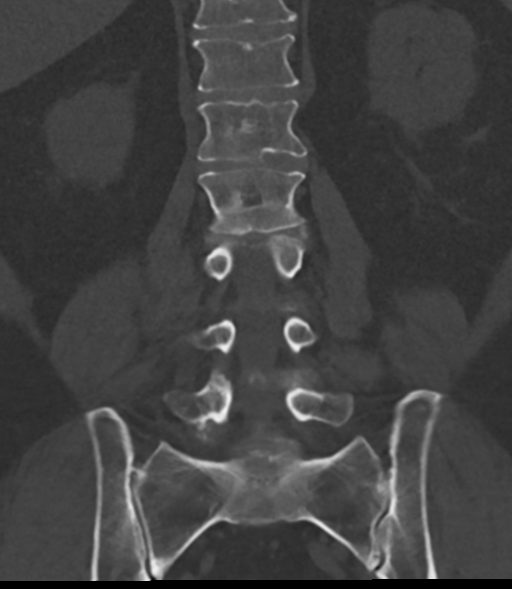
[im 29/49  bone]
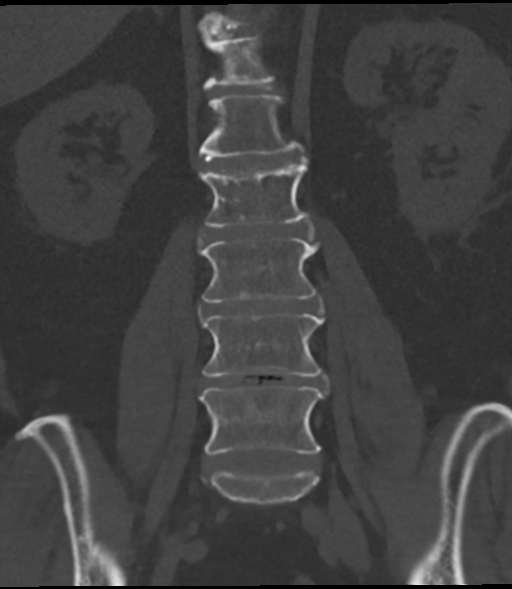

[Series 11: l-spine 2.0 sag · sagittal · 0.35mm/px · 5 of 73 slices shown, 6 images]
[im 25/73  bone]
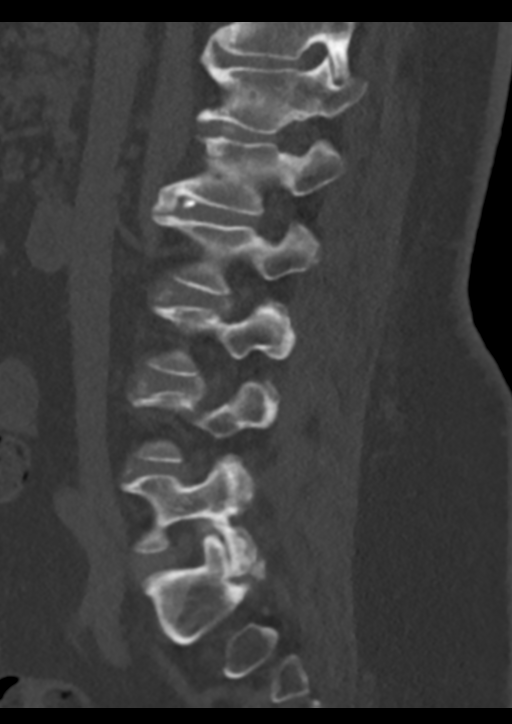
[im 31/73  bone]
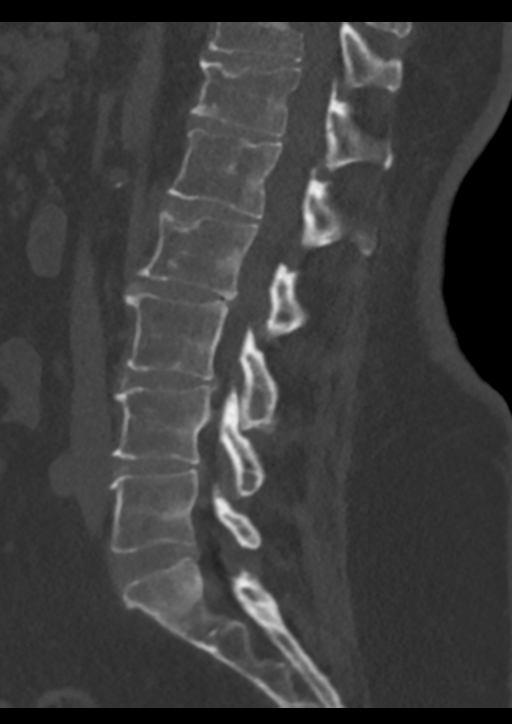
[im 37/73  soft-tissue]
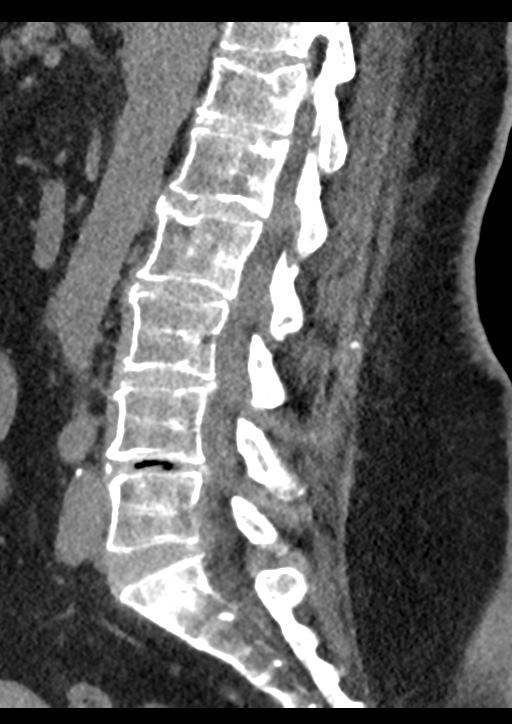
[im 37/73  bone]
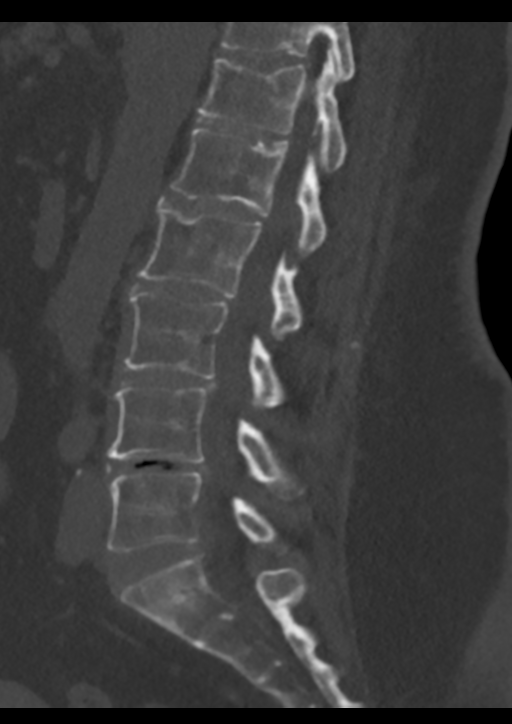
[im 43/73  bone]
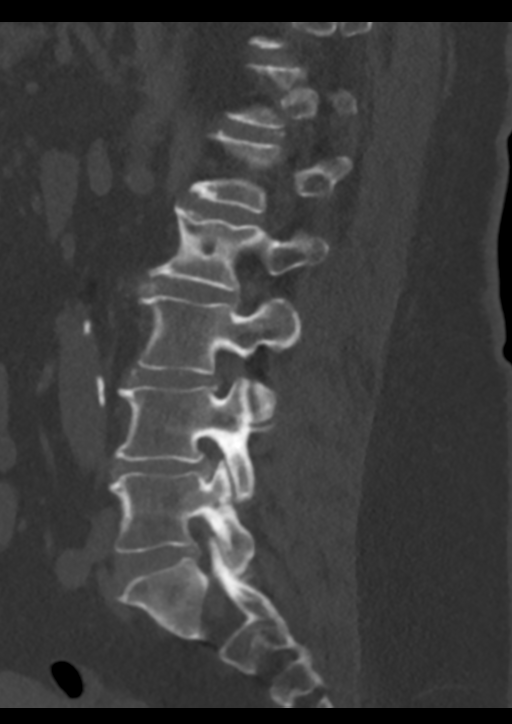
[im 49/73  bone]
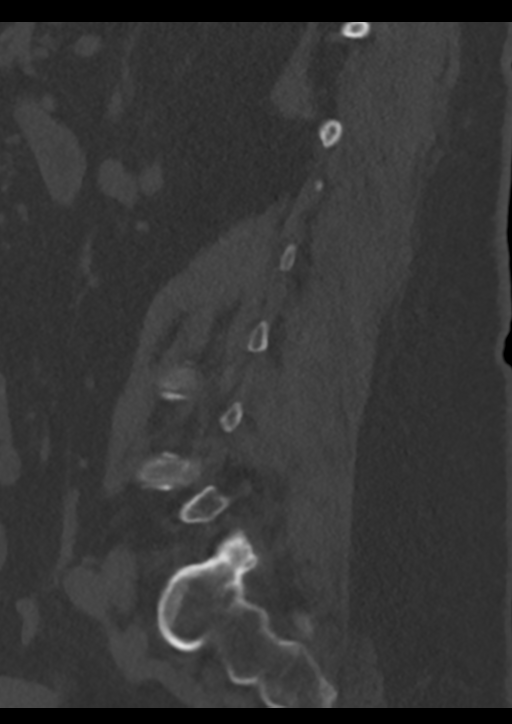

[14 of 33 positions shown; findings below may reference images not displayed]

FINDINGS: Segmentation: 5 lumbar type vertebrae

Alignment: Normal

Vertebrae: Negative for acute fracture. No evidence of bone lesion
or endplate erosion

Paraspinal and other soft tissues: No acute finding

Disc levels: Generalized disc narrowing and endplate ridging. Milder
generalized facet spurring. No evidence of high-grade canal
stenosis.
IMPRESSION: Degenerative changes without acute finding.

## 2019-03-07 IMAGING — CR PELVIS - 1-2 VIEW
1 series · 1 of 1 positions shown · non-contrast
Comparison: None.

CLINICAL DATA: Fall with right hip pain.

EXAM:
PELVIS - 1-2 VIEW

[pelvis ap]
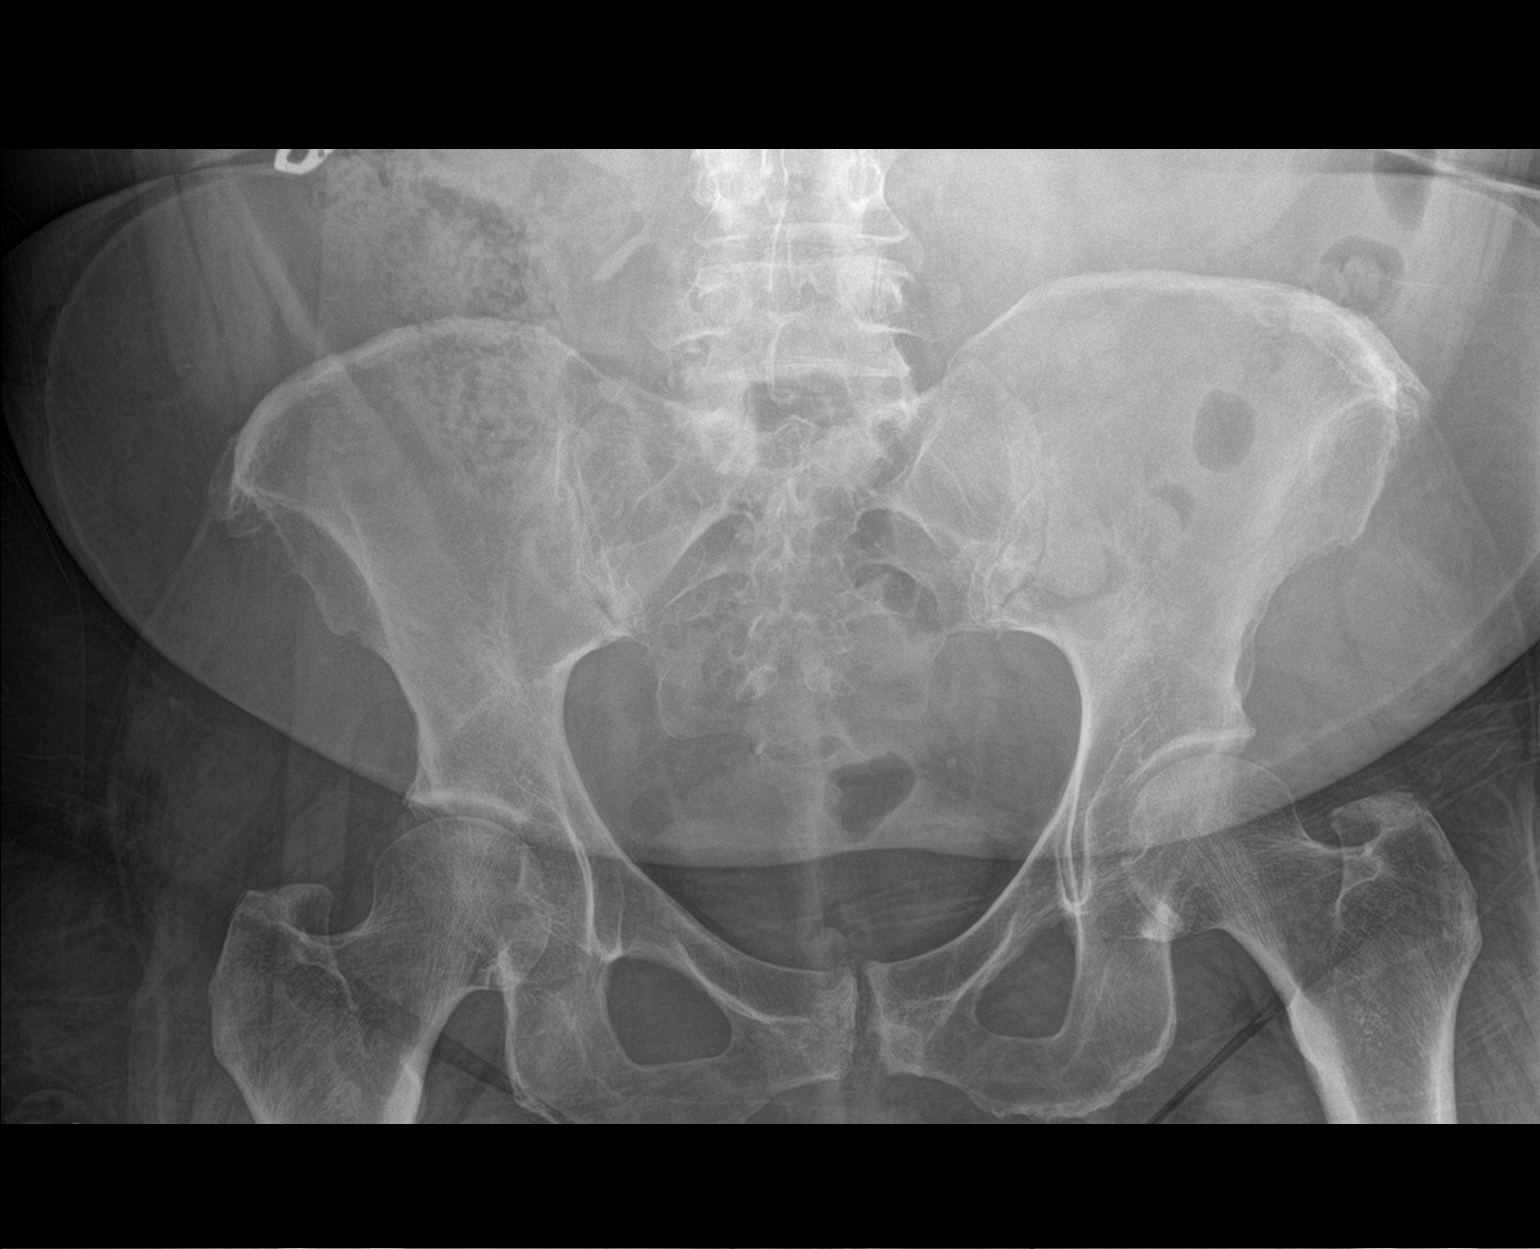

[1 of 1 positions shown; findings below may reference images not displayed]

FINDINGS: There is no evidence of pelvic fracture or diastasis. Both hips are
located in the proximal femora appear intact.
IMPRESSION: Negative.

## 2019-03-07 IMAGING — CR LUMBAR SPINE - COMPLETE 4+ VIEW
5 series · 5 of 5 positions shown · non-contrast
Comparison: Abdominal CT reformats [DATE]

CLINICAL DATA: Fall with lower back pain.  Initial encounter.

EXAM:
LUMBAR SPINE - COMPLETE 4+ VIEW

[l-spine ap]
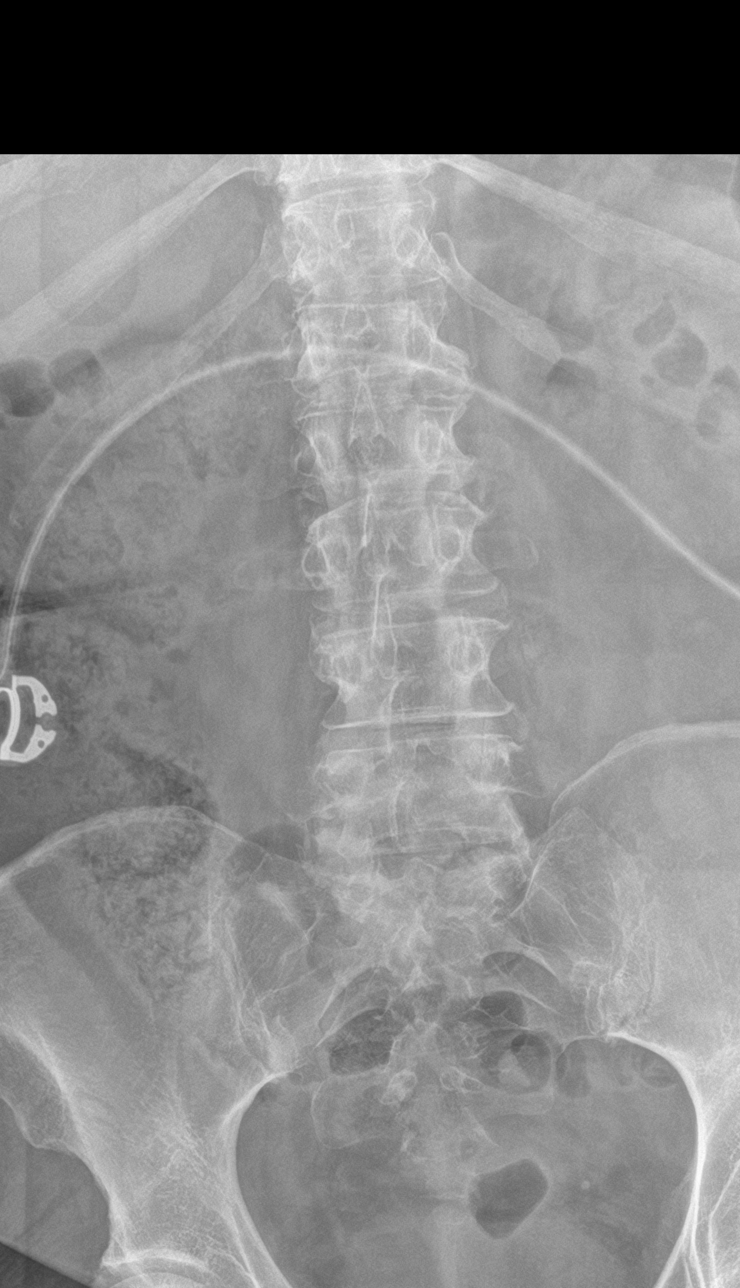

[l-spine obl (1 of 2)]
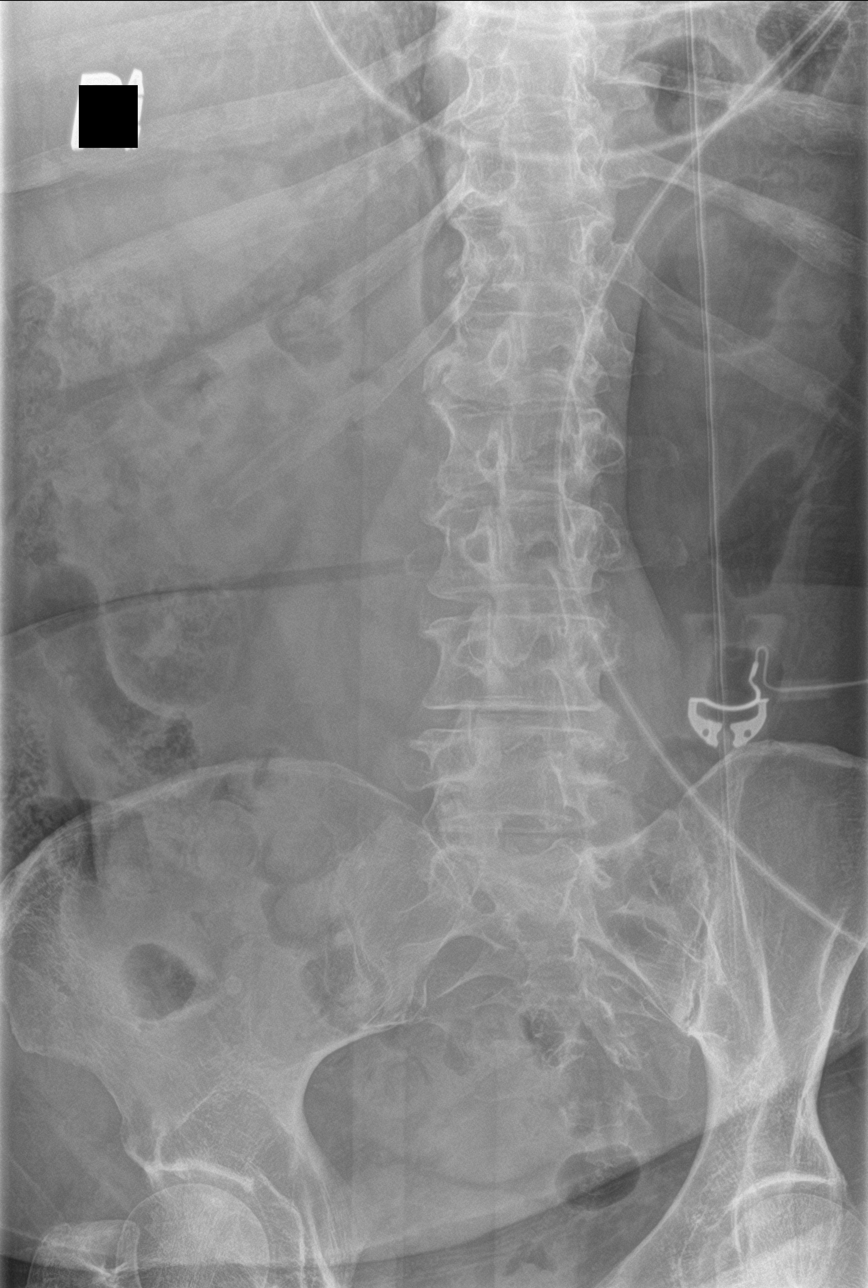

[l-spine obl (2 of 2)]
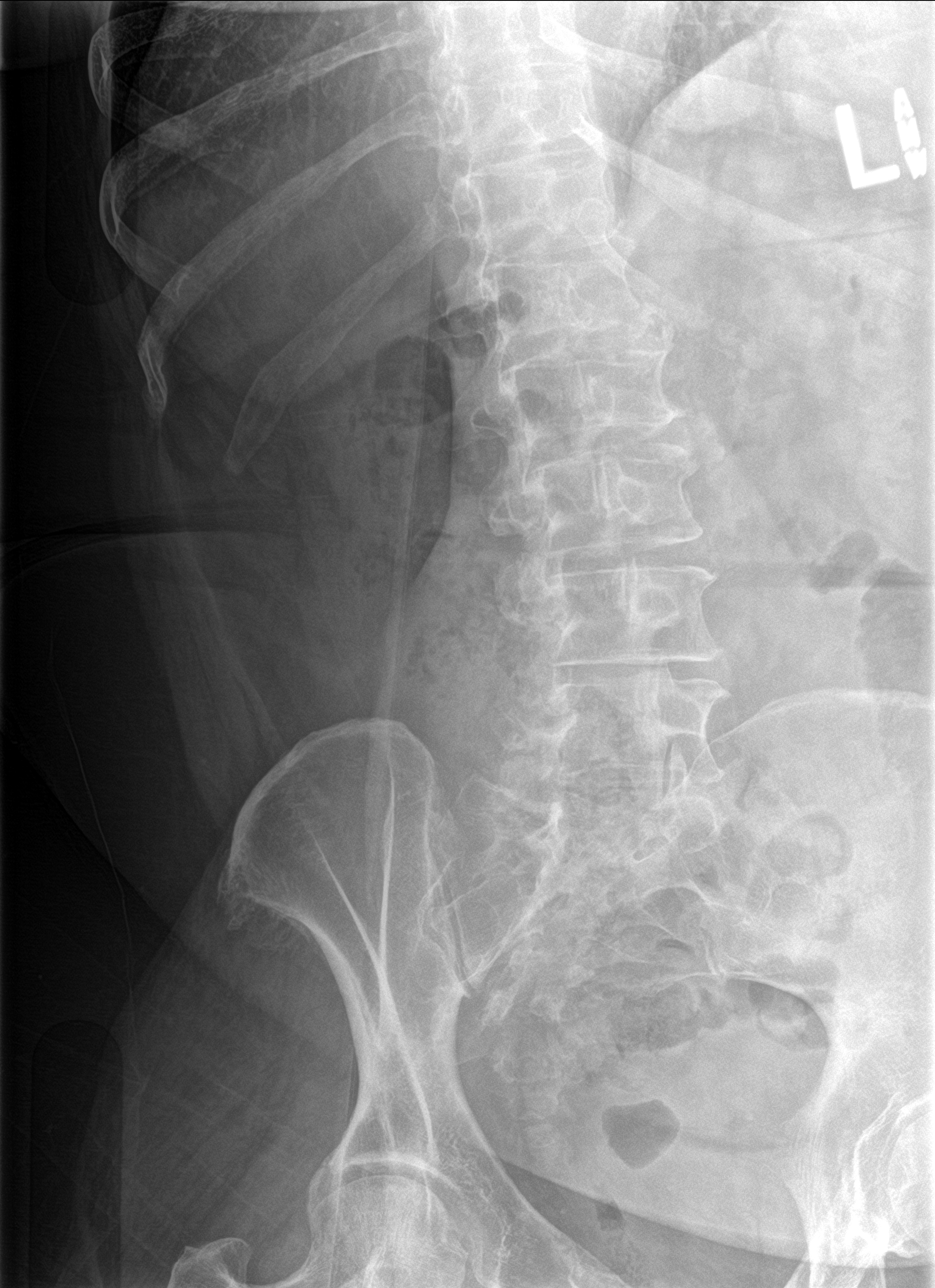

[l-spine lat]
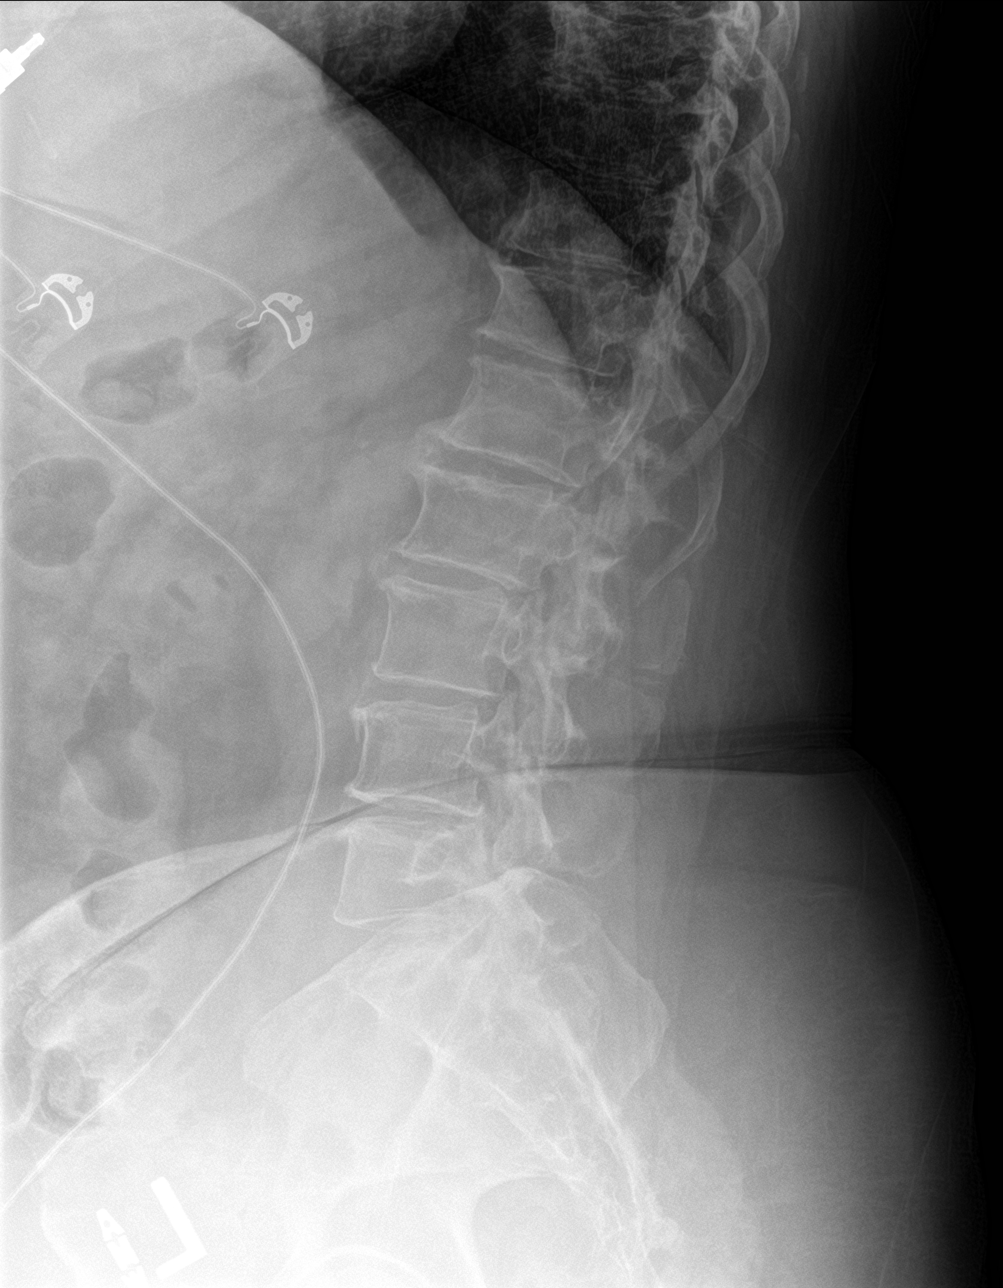

[l-spine spot]
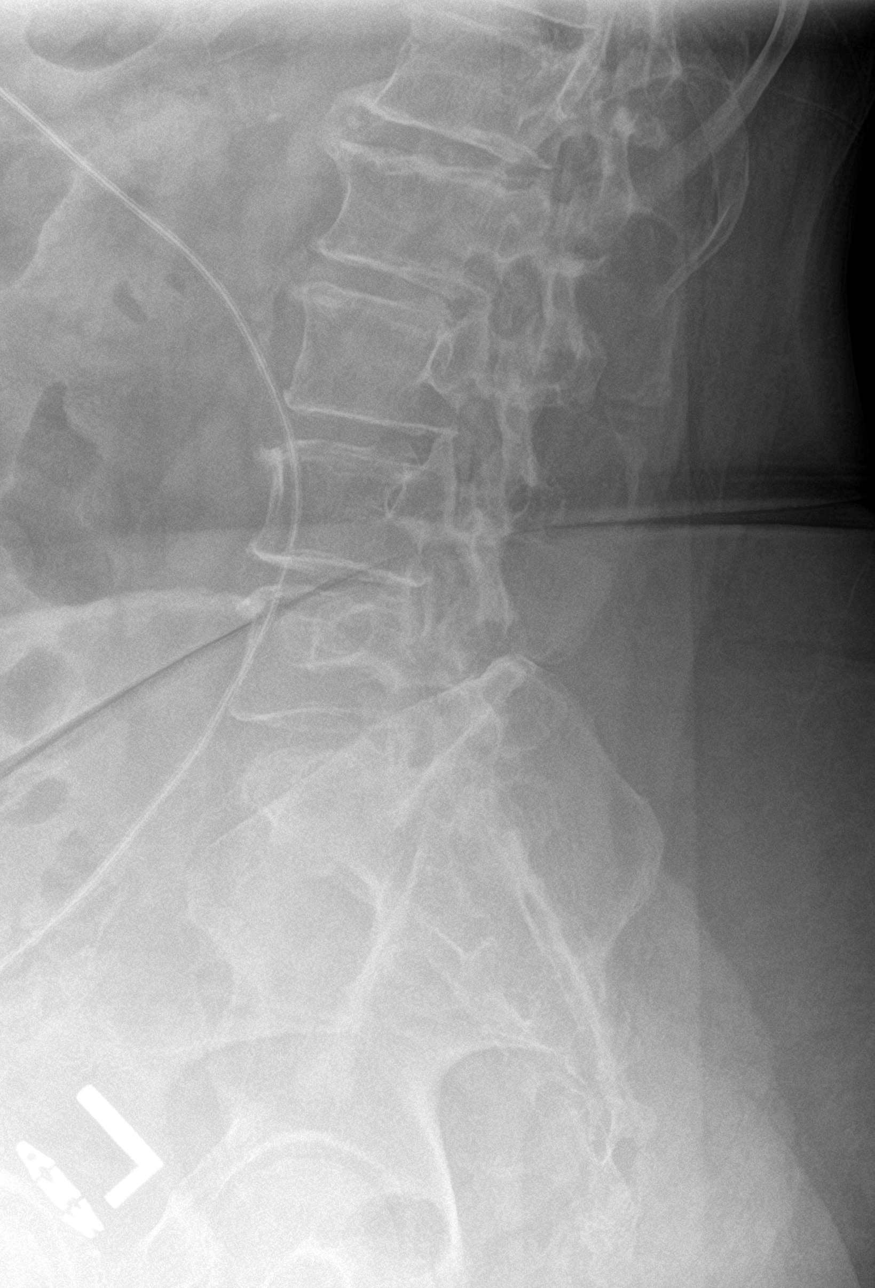

[5 of 5 positions shown; findings below may reference images not displayed]

FINDINGS: Lateral views are degraded by obliquity. There is no evident
fracture or traumatic malalignment. Generalized disc narrowing and
endplate ridging.
IMPRESSION: No acute finding.

## 2019-03-07 IMAGING — CR CHEST  1 VIEW
1 series · 1 of 1 positions shown · non-contrast
Comparison: [DATE]

CLINICAL DATA: Fall with back pain.

EXAM:
CHEST  1 VIEW

[chest pa]
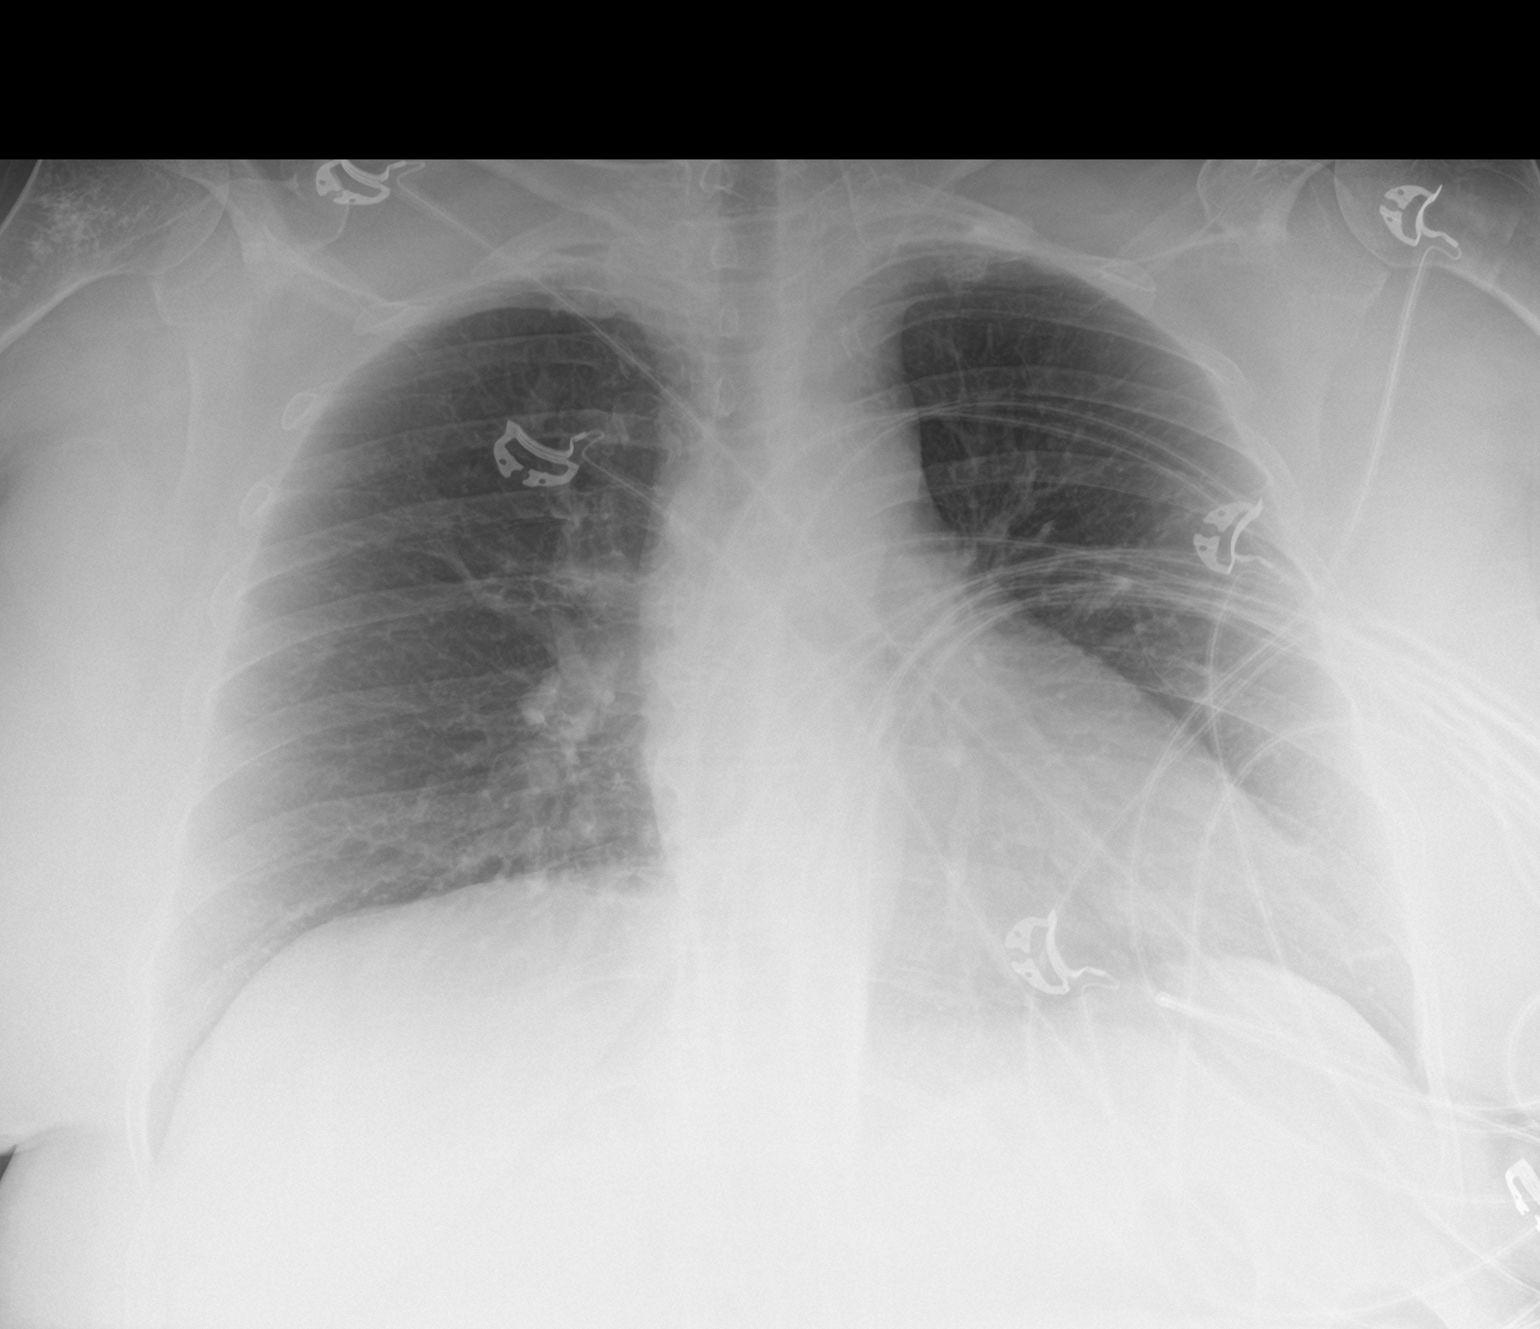

[1 of 1 positions shown; findings below may reference images not displayed]

FINDINGS: Normal heart size and mediastinal contours. No acute infiltrate or
edema. No effusion or pneumothorax. No acute osseous findings.
Chondroid matrix in the proximal right humerus
IMPRESSION: No evidence of acute disease.

## 2019-03-07 MED ORDER — CYCLOBENZAPRINE HCL 10 MG PO TABS: 10.0000 mg | ORAL_TABLET | Freq: Three times a day (TID) | ORAL | 0 refills | Status: DC | PRN

## 2019-03-07 MED ORDER — HYDROMORPHONE HCL 1 MG/ML IJ SOLN
1.0000 mg | INTRAMUSCULAR | Status: AC | PRN
  Administered 2019-03-07 (×2): 1 mg via INTRAVENOUS
  Filled 2019-03-07 (×2): qty 1

## 2019-03-07 MED ORDER — ONDANSETRON 4 MG PO TBDP
8.0000 mg | ORAL_TABLET | Freq: Once | ORAL | Status: AC
  Administered 2019-03-07: 8 mg via ORAL
  Filled 2019-03-07: qty 2

## 2019-03-07 MED ORDER — HYDROCODONE-ACETAMINOPHEN 5-325 MG PO TABS: 1.0000 | ORAL_TABLET | Freq: Four times a day (QID) | ORAL | 0 refills | Status: DC | PRN

## 2019-03-07 MED ORDER — ONDANSETRON 4 MG PO TBDP: 4.0000 mg | ORAL_TABLET | Freq: Three times a day (TID) | ORAL | 0 refills | Status: DC | PRN

## 2019-03-07 MED ORDER — NAPROXEN 500 MG PO TABS: 500.0000 mg | ORAL_TABLET | Freq: Two times a day (BID) | ORAL | 0 refills | Status: DC | PRN

## 2019-03-07 NOTE — ED Provider Notes (Signed)
[facility] EMERGENCY DEPARTMENT Provider Note   CSN: [id number] Arrival date & time: 03/07/19  0827    History   Chief Complaint Chief Complaint  Patient presents with   Fall   Hip Pain    HPI Haley White is a 63 y.o. female with past medical history significant for hypertension, obesity, gout, osteopenia who presents for evaluation after mechanical fall.  Patient states she was assembling patio furniture yesterday evening when she went to sit on the rocking chair missed the rupture landed on her posterior lumbar spine as well as her right hip.  Patient felt immediate pain.  She was able to ambulate, however with significant difficulty after the fall.  States she woke up this morning and had severe pain to her right hip and lumbar spine.  Has not take anything for pain.  She rates her pain a 10/10.  Patient states she needs help to get out of bed and ambulate.  Was using crutches at home PTA.  Denies hitting head or LOC.  She is not on anticoagulation.  She is followed by [facility] for right radial fracture from a mechanical fall approximately 9 months ago.  Denies fever, chills, nausea, vomiting, headache, neck pain, neck stiffness, numbness or tingling in her extremities, bowel or bladder incontinence, saddle paresthesias.  States movement makes pain worse.  History obtained from patient.  No interpreter was used.   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      Home Medications    Prior to Admission medications   Medication Sig Start Date End Date Taking? Authorizing Provider  albuterol (VENTOLIN HFA) 108 (90 Base) MCG/ACT inhaler Inhale 2 puffs into the lungs every 4 (four) hours as needed for wheezing or shortness of breath. 07/17/17   Liane Comber, NP  FLUoxetine (PROZAC) 20 MG capsule Take 1 capsule (20 mg total) by mouth daily. For depression 05/08/15   Lindell Spar I, NP  fluticasone (FLONASE) 50 MCG/ACT nasal spray SHAKE LQ AND U 1 TO 2 SPRAYS IEN QD 10/17/17   [provider]  ibuprofen (ADVIL,MOTRIN) 200 MG tablet Take 600 mg by mouth every 6 (six) hours as needed for headache.    [provider]  levocetirizine (XYZAL) 5 MG tablet TK 1 T PO QD IN THE EVE 12/07/17  [provider]  methylphenidate (RITALIN) 10 MG tablet TK 1 T PO BID 01/16/18   [provider]  montelukast (SINGULAIR) 10 MG tablet Take 1 tablet daily for allergies/Asthma 07/28/17 07/28/18  Unk Pinto, MD  naproxen (NAPROSYN) 500 MG tablet Take 1 tablet (500 mg total) by mouth every 12 (twelve) hours as needed for mild pain or moderate pain. 07/07/18   Antonietta Breach, PA-C  rosuvastatin (CRESTOR) 40 MG tablet TAKE 1/2 TO 1 TABLET BY MOUTH DAILY OR AS DIRECTED FOR CHOLESTEROL 08/24/18   Unk Pinto, MD  SYMBICORT 160-4.5 MCG/ACT inhaler INL 2 PFS PO BID 01/16/18   [provider]    Family History Family  History  Problem Relation Age of Onset   Dementia Father    Anxiety disorder Sister    Colon cancer Neg Hx     Social History Social History   Tobacco Use   Smoking status: Former Smoker    Packs/day: 1.00    Years: 7.00    Pack years: 7.00    Types: Cigarettes    Last attempt to quit: 11/04/1977    Years since quitting: 41.3   Smokeless tobacco: Never Used  Substance Use Topics   Alcohol use: Yes    Alcohol/week: 6.0 standard drinks    Types: 6 Glasses of wine per week    Comment: occasional   Drug use: No     Allergies   Biaxin [clarithromycin]; Serevent [salmeterol]; and Tequin [gatifloxacin]   Review of Systems Review of Systems  Constitutional: Negative.   HENT: Negative.   Respiratory: Negative.   Cardiovascular: Negative.   Gastrointestinal: Negative.   Genitourinary: Negative.   Musculoskeletal: Positive for back pain and gait problem. Negative for joint swelling, myalgias, neck pain and neck stiffness.       Right hip pain  Skin: Negative.   All other systems reviewed and are negative.    Physical Exam Updated Vital Signs BP 127/88    Pulse 83    Temp 99 F (37.2 C) (Oral)    Resp 18    LMP 11/26/2011    SpO2 92%   Physical Exam  Physical Exam  Constitutional: Pt appears well-developed and well-nourished. No distress.  HENT:  Head: Normocephalic and atraumatic.  Mouth/Throat: Oropharynx is clear and moist. No oropharyngeal exudate.  Eyes: Conjunctivae are normal.  Neck: Normal range of motion. Neck supple.  Full ROM without pain  Cardiovascular: Normal rate, regular rhythm and intact distal pulses.   Pulmonary/Chest: Effort normal and breath sounds normal. No respiratory distress. Pt has no wheezes. No Chest wall TTP. Abdominal: Soft. Pt exhibits no distension. There is no tenderness, rebound or guarding. No abd bruit or pulsatile mass. No overlying skin changes to Abd wall. Musculoskeletal:  Full range of motion Unable to be assess  secondary to pain. No midline tenderness or step-offs to cervical or thoracic spine. Tenderness to lumbar spine with palpation. Patient with significant pain with going from wheelchair to bed. Unable to lift left to side of bed secondary to hip/ back pain. Unable to perform straight leg raise seconday to hip pain. Significant pain to right hip and proximal femur. No shortening or rotation of legs. No tenderness to knees, tib/fib and ankles bilaterally. Lymphadenopathy:    Pt has no cervical adenopathy.  Neurological: Pt is alert. Pt has normal reflexes.  Reflex Scores:      Bicep reflexes are 2+ on the right side and 2+ on the left side.  Brachioradialis reflexes are 2+ on the right side and 2+ on the left side.      Patellar reflexes are 2+ on the right side and 2+ on the left side.      Achilles reflexes are 2+ on the right side and 2+ on the left side. Speech is clear and goal oriented, follows commands Normal 5/5 strength in upper bilaterally.  Patient able to perform dorsiflexion and plantar flexion laterally without difficulty.  Wiggles toes bilatrally.  Unable to flex or extend at the hips secondary to pain. Strong and equal grip strength Sensation normal to light and sharp touch Coordination intact Skin: Skin is warm and dry. No rash noted or lesions noted. Pt is not diaphoretic. No erythema, ecchymosis,edema or warmth.  Psychiatric: Pt has a normal mood and affect. Behavior is normal.  Nursing note and vitals reviewed. ED Treatments / Results  Labs (all labs ordered are listed, but only abnormal results are displayed) Labs Reviewed  BASIC METABOLIC PANEL - Abnormal; Notable for the following components:      Result Value   CO2 21 (*)    Glucose, Bld 137 (*)    All other components within normal limits  SARS CORONAVIRUS 2 (HOSPITAL ORDER, [facility])  CBC WITH DIFFERENTIAL/PLATELET  PROTIME-INR  TYPE AND SCREEN  ABO/RH    EKG EKG  Interpretation  Date/Time:  Sunday Mar 07 2019 08:54:32 EDT Ventricular Rate:  97 PR Interval:    QRS Duration: 90 QT Interval:  368 QTC Calculation: 468 R Axis:   50 Text Interpretation:  Sinus rhythm Low voltage, precordial leads Confirmed by Virgel Manifold [phone]) on 03/07/2019 9:07:53 AM Also confirmed by Virgel Manifold [phone]), editor [place], Jeannetta Nap [phone])  on 03/07/2019 9:54:19 AM   Radiology Dg Chest 1 View  Result Date: 03/07/2019 CLINICAL DATA:  Fall with back pain. EXAM: CHEST  1 VIEW COMPARISON:  01/07/2018 FINDINGS: Normal heart size and mediastinal contours. No acute infiltrate or edema. No effusion or pneumothorax. No acute osseous findings. Chondroid matrix in the proximal right humerus IMPRESSION: No evidence of acute disease. Electronically Signed   By: Monte Fantasia M.D.   On: 03/07/2019 11:03   Dg Lumbar Spine Complete  Result Date: 03/07/2019 CLINICAL DATA:  Fall with lower back pain.  Initial encounter. EXAM: LUMBAR SPINE - COMPLETE 4+ VIEW COMPARISON:  Abdominal CT reformats 04/05/2012 FINDINGS: Lateral views are degraded by obliquity. There is no evident fracture or traumatic malalignment. Generalized disc narrowing and endplate ridging. IMPRESSION: No acute finding. Electronically Signed   By: Monte Fantasia M.D.   On: 03/07/2019 11:01   Dg Pelvis 1-2 Views  Result Date: 03/07/2019 CLINICAL DATA:  Fall with right hip pain. EXAM: PELVIS - 1-2 VIEW COMPARISON:  None. FINDINGS: There is no evidence of pelvic fracture or diastasis. Both hips are located in the proximal femora appear intact. IMPRESSION: Negative. Electronically Signed   By: Monte Fantasia M.D.   On: 03/07/2019 11:02   Dg Femur, Min 2 Views Right  Result Date: 03/07/2019 CLINICAL DATA:  Fall last night with right hip pain. EXAM: RIGHT FEMUR 2 VIEWS COMPARISON:  None. FINDINGS: There is no evidence of fracture or other focal bone lesions. Soft tissues are unremarkable. Degenerative spurring at the knee.  IMPRESSION: Negative for fracture. Electronically Signed   By: Monte Fantasia M.D.   On: 03/07/2019 11:00    Procedures Procedures (including critical care time)  Medications Ordered in ED Medications  HYDROmorphone (DILAUDID) injection 1  mg (1 mg Intravenous Given 03/07/19 7017)     Initial Impression / Assessment and Plan / ED Course  I have reviewed the triage vital signs and the nursing notes.  Pertinent labs & imaging results that were available during my care of the patient were reviewed by me and considered in my medical decision making (see chart for details).  63 year old female presents for evaluation after mechanical fall which occurred 12 hours PTA.  Afebrile, nonseptic, non-ill-appearing.  Denies hitting head or LOC.  Denies anticoagulation.  Patient with significant pain to her lumbar spine as well as her right hip.  Range of motion limited secondary to patient's pain.  She was able to go from wheelchair to bed, however with significant pain and needed assistance for picking up her legs off the edge of the bed.  She has no shortening or rotation of legs, or she has significant tenderness palpation over her right hip and proximal femur.  She is able to wiggle toes, plantarflex and dorsiflex bilateral lower extremities without difficulty.  She has 2+ DP, PT pulses.  She is neurovascularly intact.  Followed by [facility] whom she last saw 9 months ago for a distal radial fracture secondary to mechanical fall at that time.  She does have history of osteopenia which she does take supplemental calcium for.  High suspicion for right hip fracture.  Given this will obtain basic labs, imaging, COVID test, pain management and reevaluate. No red flag for back pain. Low for discitis, osteomyelitis, transverse myelitis, psoas abscess, cauda equina  Radiology called-- Hip included in Femur and Pelvis. Ok to cancel Hip and proceeded with Femur and Pelvis.  Labs and imaging personally  reviewed: CBC: without Leukocytosis, Hgb 13.0 BMP: Mild hyperglycemia, No additional electrolyte, renal, liver abnormality. INR: 0.9 COVID: Negative DG Pelvis: No acute fracture, or dislocation DG Femur: No acute fracture or dislocation DG Chest: No acute cardiopulmonary disease. DG Lumbar: No acute fracture or dislocation. EKG: No ST/T changes. No STEMI  1115: Pain with improvement with Dilaudid. Continues to be NV intact. Still mildly tender to palpation over right lateral hip. Will order CT lumbar/ right hip to r/o occult hip fracture and reevaluate. If CT negative trial ambulation and reeval.  Patient care transferred to [employer]  who will determine ultimate treatment, plan and disposition of patient. Patient pending CT Lumbar/Right Hip at care transfer.     Final Clinical Impressions(s) / ED Diagnoses   Final diagnoses:  Fall, initial encounter  Right hip pain  Acute right-sided low back pain without sciatica    ED Discharge Orders    None       Dameir Gentzler A, PA-C 03/07/19 1135    Virgel Manifold, MD 03/07/19 1153

## 2019-03-07 NOTE — ED Provider Notes (Signed)
Care assumed from [facility], PA-C, at sign over of patient, please see their notes for full documentation of patient's complaint/HPI. Briefly, pt here with mechanical fall now with R hip and lumbar pain. Results so far show COVID19 neg, BMP with gluc 137, CBC w/diff WNL, INR WNL, R femur xray neg, CXR neg, lumbar xray neg, pelvis xray neg. Awaiting CT R hip and lumbar spine. Plan is to reassess after that, if negative and can ambulate, will be discharged home.   Physical Exam  BP 134/85   Pulse 89   Temp 99 F (37.2 C) (Oral)   Resp 18   LMP 11/26/2011   SpO2 98%   Physical Exam Gen: afebrile, VSS, NAD HEENT: EOMI Resp: no resp distress CV: rate WNL Abd: appearance normal, nondistended MsK: moving all extremities with ease. Moderate R lateral hip joint line TTP near to greater trochanteric region, no crepitus or deformity, no limb length discrepancy or abnormal rotation, some mild R lumbar paraspinous muscle TTP and spasm, no bony stepoffs or deformities and no midline spinal tenderness. Gait steady but antalgic with some assistance and then even steadier with assistance from a walker, although still antalgic.  Neuro: A&O  ED Course/Procedures     Results for orders placed or performed during the hospital encounter of 03/07/19  SARS Coronavirus 2 [facility] order, Performed in [facility] hospital lab)  Result Value Ref Range   SARS Coronavirus 2 NEGATIVE NEGATIVE  Basic metabolic panel  Result Value Ref Range   Sodium 142 135 - 145 mmol/L   Potassium 4.3 3.5 - 5.1 mmol/L   Chloride 109 98 - 111 mmol/L   CO2 21 (L) 22 - 32 mmol/L   Glucose, Bld 137 (H) 70 - 99 mg/dL   BUN 10 8 - 23 mg/dL   Creatinine, Ser 0.79 0.44 - 1.00 mg/dL   Calcium 8.9 8.9 - 10.3 mg/dL   GFR calc non Af Amer >60 >60 mL/min   GFR calc Af Amer >60 >60 mL/min   Anion gap 12 5 - 15  CBC WITH DIFFERENTIAL  Result Value Ref Range   WBC 8.7 4.0 - 10.5 K/uL   RBC 4.11 3.87 - 5.11 MIL/uL   Hemoglobin  13.0 12.0 - 15.0 g/dL   HCT 39.2 36.0 - 46.0 %   MCV 95.4 80.0 - 100.0 fL   MCH 31.6 26.0 - 34.0 pg   MCHC 33.2 30.0 - 36.0 g/dL   RDW 12.1 11.5 - 15.5 %   Platelets 286 150 - 400 K/uL   nRBC 0.0 0.0 - 0.2 %   Neutrophils Relative % 59 %   Neutro Abs 5.2 1.7 - 7.7 K/uL   Lymphocytes Relative 28 %   Lymphs Abs 2.5 0.7 - 4.0 K/uL   Monocytes Relative 9 %   Monocytes Absolute 0.8 0.1 - 1.0 K/uL   Eosinophils Relative 2 %   Eosinophils Absolute 0.2 0.0 - 0.5 K/uL   Basophils Relative 1 %   Basophils Absolute 0.1 0.0 - 0.1 K/uL   Immature Granulocytes 1 %   Abs Immature Granulocytes 0.04 0.00 - 0.07 K/uL  Protime-INR  Result Value Ref Range   Prothrombin Time 12.3 11.4 - 15.2 seconds   INR 0.9 0.8 - 1.2  Type and screen [place]  Result Value Ref Range   ABO/RH(D) A NEG    Antibody Screen NEG    Sample Expiration      03/10/2019 Performed at [facility], [address] [place], [place] [address]   ABO/Rh  Result Value Ref Range   ABO/RH(D)      A NEG Performed at [facility] [address], [place], [place] [address]    Dg Chest 1 View  Result Date: 03/07/2019 CLINICAL DATA:  Fall with back pain. EXAM: CHEST  1 VIEW COMPARISON:  01/07/2018 FINDINGS: Normal heart size and mediastinal contours. No acute infiltrate or edema. No effusion or pneumothorax. No acute osseous findings. Chondroid matrix in the proximal right humerus IMPRESSION: No evidence of acute disease. Electronically Signed   By: Monte Fantasia M.D.   On: 03/07/2019 11:03   Dg Lumbar Spine Complete  Result Date: 03/07/2019 CLINICAL DATA:  Fall with lower back pain.  Initial encounter. EXAM: LUMBAR SPINE - COMPLETE 4+ VIEW COMPARISON:  Abdominal CT reformats 04/05/2012 FINDINGS: Lateral views are degraded by obliquity. There is no evident fracture or traumatic malalignment. Generalized disc narrowing and endplate ridging. IMPRESSION: No acute finding. Electronically Signed   By:  Monte Fantasia M.D.   On: 03/07/2019 11:01   Dg Pelvis 1-2 Views  Result Date: 03/07/2019 CLINICAL DATA:  Fall with right hip pain. EXAM: PELVIS - 1-2 VIEW COMPARISON:  None. FINDINGS: There is no evidence of pelvic fracture or diastasis. Both hips are located in the proximal femora appear intact. IMPRESSION: Negative. Electronically Signed   By: Monte Fantasia M.D.   On: 03/07/2019 11:02   Ct Lumbar Spine Wo Contrast  Result Date: 03/07/2019 CLINICAL DATA:  Lower back pain EXAM: CT LUMBAR SPINE WITHOUT CONTRAST TECHNIQUE: Multidetector CT imaging of the lumbar spine was performed without intravenous contrast administration. Multiplanar CT image reconstructions were also generated. COMPARISON:  Lumbar radiography from earlier today FINDINGS: Segmentation: 5 lumbar type vertebrae Alignment: Normal Vertebrae: Negative for acute fracture. No evidence of bone lesion or endplate erosion Paraspinal and other soft tissues: No acute finding Disc levels: Generalized disc narrowing and endplate ridging. Milder generalized facet spurring. No evidence of high-grade canal stenosis. IMPRESSION: Degenerative changes without acute finding. Electronically Signed   By: Monte Fantasia M.D.   On: 03/07/2019 11:52   Ct Hip Right Wo Contrast  Result Date: 03/07/2019 CLINICAL DATA:  Hip pain status post fall last night EXAM: CT OF THE RIGHT HIP WITHOUT CONTRAST TECHNIQUE: Multidetector CT imaging of the right hip was performed according to the standard protocol. Multiplanar CT image reconstructions were also generated. COMPARISON:  None. FINDINGS: Bones/Joint/Cartilage No fracture or dislocation. Normal alignment. No joint effusion. Right hip joint space is maintained. Minimal osteoarthritis of the right SI joint. Bilateral facet arthropathy at L5-S1. Ligaments Ligaments are suboptimally evaluated by CT. Muscles and Tendons Muscles are normal. No muscle atrophy. No intramuscular fluid collection or hematoma. Soft tissue No  fluid collection or hematoma.  No soft tissue mass. IMPRESSION: 1.  No acute osseous injury of the right hip. Electronically Signed   By: Kathreen Devoid   On: 03/07/2019 11:47   Dg Femur, Min 2 Views Right  Result Date: 03/07/2019 CLINICAL DATA:  Fall last night with right hip pain. EXAM: RIGHT FEMUR 2 VIEWS COMPARISON:  None. FINDINGS: There is no evidence of fracture or other focal bone lesions. Soft tissues are unremarkable. Degenerative spurring at the knee. IMPRESSION: Negative for fracture. Electronically Signed   By: Monte Fantasia M.D.   On: 03/07/2019 11:00     Meds ordered this encounter  Medications  . HYDROmorphone (DILAUDID) injection 1 mg  . cyclobenzaprine (FLEXERIL) 10 MG tablet  Sig: Take 1 tablet (10 mg total) by mouth 3 (three) times daily as needed for muscle spasms.    Dispense:  15 tablet    Refill:  0    Order Specific Question:   Supervising Provider    Answer:   MILLER, BRIAN [[id number]]  . naproxen (NAPROSYN) 500 MG tablet    Sig: Take 1 tablet (500 mg total) by mouth 2 (two) times daily as needed for mild pain, moderate pain or headache (TAKE WITH MEALS.).    Dispense:  20 tablet    Refill:  0    Order Specific Question:   Supervising Provider    Answer:   MILLER, BRIAN [[id number]]  . HYDROcodone-acetaminophen (NORCO) 5-325 MG tablet    Sig: Take 1 tablet by mouth every 6 (six) hours as needed for severe pain.    Dispense:  10 tablet    Refill:  0    Order Specific Question:   Supervising Provider    Answer:   Noemi Chapel [[id number]]     MDM:   ICD-10-CM   1. Fall, initial encounter W19.XXXA   2. Fall W19.Merril Abbe DG Pelvis 1-2 Views    DG Pelvis 1-2 Views    CANCELED: DG Pelvis 1-2 Views    CANCELED: DG Pelvis 1-2 Views  3. Right hip pain M25.551   4. Acute right-sided low back pain without sciatica M54.5   5. Degenerative disc disease, lumbar M51.36     12:38 PM CT R hip neg for fx. CT lumbar with degenerative changes but no acute findings. Pt able to walk  using a walker for support, states she has crutches at home to use. Pain is mostly located over R lateral hip over greater trochanter, likely some component of bursitis from fall, vs aggravation of some arthritis/contusions. Pt able to ambulate and can use crutches at home for comfort. Will send home with pain meds, advised use of warm compresses, and f/up with ortho in 1wk for recheck. I explained the diagnosis and have given explicit precautions to return to the ER including for any other new or worsening symptoms. The patient understands and accepts the medical plan as it's been dictated and I have answered their questions. Discharge instructions concerning home care and prescriptions have been given. The patient is STABLE and is discharged to home in good condition.    [address], [place], [place] 03/07/19 1245    Virgel Manifold, MD 03/07/19 1358

## 2019-03-07 NOTE — ED Notes (Signed)
Requested EDP to call in zofran RX to [facility] on [address].

## 2019-03-07 NOTE — ED Notes (Signed)
Pt became nauseous at d/c. Given 8mg  Zofran. Assisted pt to sit up slowly and will proceed with d/c as pt tolerates.

## 2019-03-07 NOTE — ED Notes (Signed)
Patient verbalizes understanding of discharge instructions. Opportunity for questioning and answers were provided. Armband removed by staff, pt discharged from ED.  

## 2019-03-07 NOTE — ED Triage Notes (Signed)
Pt endorses tripping and falling over her patio furniture last night and landing on her right hip causing hip pain and lower back pain. Pt appears to be in great amount of pain upon movement. No loc, not on thinners.

## 2019-03-07 NOTE — Discharge Instructions (Signed)
Your CTs did not show any fractures to your hip or back. You have some arthritis in your lower back. This was likely aggravated by your fall. Use your home crutches as needed for comfort. Use flexeril as directed as needed for muscle spasms. Alternate between naprosyn and norco as directed, as needed for pain but do not drive or operate machinery with narcotic pain medication or muscle relaxant use. You may need an over-the-counter stool softener such as colace or miralax to use with this pain medication use, if any constipation occurs. Use warm compresses to areas of pain, no more than 20 minutes per hour. Follow up with your orthopedist in the next 5-7 days for recheck of symptoms. Return to the ER for emergent changes or worsening symptoms.

## 2019-03-07 NOTE — ED Notes (Signed)
Patient transported to CT 

## 2019-03-17 DIAGNOSIS — M47817 Spondylosis without myelopathy or radiculopathy, lumbosacral region: Secondary | ICD-10-CM | POA: Insufficient documentation

## 2019-03-17 DIAGNOSIS — M549 Dorsalgia, unspecified: Secondary | ICD-10-CM | POA: Insufficient documentation

## 2019-03-17 DIAGNOSIS — M545 Low back pain, unspecified: Secondary | ICD-10-CM | POA: Insufficient documentation

## 2019-06-05 ENCOUNTER — Other Ambulatory Visit: Payer: Self-pay | Admitting: Internal Medicine

## 2019-06-05 DIAGNOSIS — J4520 Mild intermittent asthma, uncomplicated: Secondary | ICD-10-CM

## 2019-07-14 ENCOUNTER — Other Ambulatory Visit: Payer: Self-pay

## 2019-09-02 ENCOUNTER — Other Ambulatory Visit: Payer: Self-pay | Admitting: Internal Medicine

## 2019-11-16 ENCOUNTER — Telehealth: Payer: Self-pay | Admitting: *Deleted

## 2019-11-16 ENCOUNTER — Other Ambulatory Visit: Payer: Self-pay | Admitting: Internal Medicine

## 2019-11-16 DIAGNOSIS — J0100 Acute maxillary sinusitis, unspecified: Secondary | ICD-10-CM

## 2019-11-16 MED ORDER — DEXAMETHASONE 0.5 MG PO TABS: ORAL_TABLET | ORAL | 0 refills | Status: DC

## 2019-11-16 MED ORDER — AZITHROMYCIN 250 MG PO TABS: ORAL_TABLET | ORAL | 1 refills | Status: DC

## 2019-11-16 NOTE — Telephone Encounter (Signed)
Called patient to inform her Dr Melford Aase sent in RX's for congestion.

## 2019-11-28 ENCOUNTER — Encounter: Payer: Self-pay | Admitting: Internal Medicine

## 2019-11-28 NOTE — Progress Notes (Signed)
Annual Screening/Preventative Visit & Comprehensive Evaluation &  Examination     This very nice 64 y.o. [name]  presents for a Screening /Preventative Visit & comprehensive evaluation and management of multiple medical co-morbidities.   Patient has been followed for HTN, HLD, Morbid Obesity, Prediabetes  and Vitamin D Deficiency. 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 Patient is treated by Dr Chucky May for  ADD / Depression with Fluoxetine & Methylphenidate.       Labile HTN predates since 2008. Patient's BP has been controlled at home and patient denies any cardiac symptoms as chest pain, palpitations, shortness of breath, dizziness or ankle swelling. Today's BP is at goal albeit high normal diastolic - [id number] and is rechecked x 2 at 154 / 98-104 with wrist and then wall cuff.     Patient's hyperlipidemia is controlled with diet and medications. Patient denies myalgias or other medication SE's. Last lipids were not at goal:  Lab Results  Component Value Date   CHOL 227 (H) 08/06/2018   HDL 72 08/06/2018   LDLCALC 126 (H) 08/06/2018   TRIG 169 (H) 08/06/2018   CHOLHDL 3.2 08/06/2018       Patient has  Morbid Obesity (BMI 39+) and hx/o prediabetes (5.9% / 2014)   and patient denies reactive hypoglycemic symptoms, visual blurring, diabetic polys or paresthesias. Last A1c was not at goal:  Lab Results  Component Value Date   HGBA1C 5.8 (H) 08/06/2018       Finally, patient has history of Vitamin D Deficiency ("20" / 2010)   and last Vitamin D was still low (goal 70-100):  Lab Results  Component Value Date   VD25OH 37 04/20/2018    Current Outpatient Medications on File Prior to Visit  Medication Sig  . albuterol (VENTOLIN HFA) 108 (90 Base) MCG/ACT inhaler Inhale 2 puffs into the lungs every 4 (four) hours as needed for wheezing or shortness of breath.  . Ascorbic Acid (VITAMIN C PO) Take 1 tablet by mouth.  . B Complex Vitamins (VITAMIN B-COMPLEX PO)  Take 1 tablet by mouth daily.  . Calcium Carbonate (CALCIUM 600 PO) Take by mouth daily.  . cyclobenzaprine (FLEXERIL) 10 MG tablet Take 1 tablet (10 mg total) by mouth 3 (three) times daily as needed for muscle spasms.  Marland Kitchen FLUoxetine (PROZAC) 20 MG capsule Take 1 capsule (20 mg total) by mouth daily. For depression  . fluticasone (FLONASE) 50 MCG/ACT nasal spray SHAKE LQ AND U 1 TO 2 SPRAYS IEN QD  . HYDROcodone-acetaminophen (NORCO) 5-325 MG tablet Take 1 tablet by mouth every 6 (six) hours as needed for severe pain.  Marland Kitchen levocetirizine (XYZAL) 5 MG tablet TK 1 T PO QD IN THE EVE  . Magnesium 400 MG TABS Take 1 tablet by mouth daily.  . methylphenidate (RITALIN) 10 MG tablet TK 1 T PO BID  . naproxen (NAPROSYN) 500 MG tablet Take 1 tablet (500 mg total) by mouth 2 (two) times daily as needed for mild pain, moderate pain or headache (TAKE WITH MEALS.).  Marland Kitchen rosuvastatin (CRESTOR) 40 MG tablet Take 1 tablet Daily for Cholesterol  . SYMBICORT 160-4.5 MCG/ACT inhaler INL 2 PFS PO BID  . VITAMIN D PO Take 10,000 Units by mouth daily.  . montelukast (SINGULAIR) 10 MG tablet Take 1 tablet daily for allergies/Asthma   No current facility-administered medications on file prior to visit.   Allergies  Allergen Reactions  . Biaxin [Clarithromycin]     GI upset  .  Serevent [Salmeterol]     Palpitations  . Tequin [Gatifloxacin]     GI upset. thrush   Past Medical History:  Diagnosis Date  . Anxiety   . Arthritis   . Asthma   . Cataract   . Gout   . Hypertension   . Obese    Health Maintenance  Topic Date Due  . Hepatitis C Screening  31-Jul-1956  . MAMMOGRAM  09/23/2011  . PAP SMEAR-Modifier  08/13/2016  . TETANUS/TDAP  11/04/2018  . COLONOSCOPY  11/16/2018  . INFLUENZA VACCINE  06/05/2019  . HIV Screening  Completed   Immunization History  Administered Date(s) Administered  . Influenza,inj,Quad PF,6+ Mos 09/17/2017  . Influenza-Unspecified 08/20/2018  . PPD Test 10/08/2017,  11/29/2019  . Pneumococcal Conjugate-13 11/05/1995  . Pneumococcal Polysaccharide-23 10/08/2017  . Tdap 11/04/2008   Last Colon - 11/16/2013 - Dr Delfin Edis - Recommended 5 year f/u - due Feb 2019. She was sent a recall letter 01/02/2019 by Dr Loletha Carrow.  Last MGM - 09/26/2009  Past Surgical History:  Procedure Laterality Date  . APPENDECTOMY  1984  . CATARACT EXTRACTION W/ INTRAOCULAR LENS  IMPLANT, BILATERAL  2014  . [facility]   X3  . FOOT SURGERY Left 2002  . KNEE SURGERY Left 1998  . ROTATOR CUFF REPAIR Right 2007  . TUBAL LIGATION  1994   Family History  Problem Relation Age of Onset  . Dementia Father   . Anxiety disorder Sister   . Colon cancer Neg Hx    Social History   Tobacco Use  . Smoking status: Former Smoker    Packs/day: 1.00    Years: 7.00    Pack years: 7.00    Types: Cigarettes    Quit date: 11/04/1977    Years since quitting: 42.0  . Smokeless tobacco: Never Used  Substance Use Topics  . Alcohol use: Yes    Alcohol/week: 6.0 standard drinks    Types: 6 Glasses of wine per week    Comment: occasional  . Drug use: No    ROS Constitutional: Denies fever, chills, weight loss/gain, headaches, insomnia,  night sweats, and change in appetite. Does c/o fatigue. Eyes: Denies redness, blurred vision, diplopia, discharge, itchy, watery eyes.  ENT: Denies discharge, congestion, post nasal drip, epistaxis, sore throat, earache, hearing loss, dental pain, Tinnitus, Vertigo, Sinus pain, snoring.  Cardio: Denies chest pain, palpitations, irregular heartbeat, syncope, dyspnea, diaphoresis, orthopnea, PND, claudication, edema Respiratory: denies cough, dyspnea, DOE, pleurisy, hoarseness, laryngitis, wheezing.  Gastrointestinal: Denies dysphagia, heartburn, reflux, water brash, pain, cramps, nausea, vomiting, bloating, diarrhea, constipation, hematemesis, melena, hematochezia, jaundice, hemorrhoids Genitourinary: Denies dysuria, frequency,  urgency, nocturia, hesitancy, discharge, hematuria, flank pain Breast: Breast lumps, nipple discharge, bleeding.  Musculoskeletal: Denies arthralgia, myalgia, stiffness, Jt. Swelling, pain, limp, and strain/sprain. Denies falls. Skin: Denies puritis, rash, hives, warts, acne, eczema, changing in skin lesion Neuro: No weakness, tremor, incoordination, spasms, paresthesia, pain Psychiatric: Denies confusion, memory loss, sensory loss. Denies Depression. Endocrine: Denies change in weight, skin, hair change, nocturia, and paresthesia, diabetic polys, visual blurring, hyper / hypo glycemic episodes.  Heme/Lymph: No excessive bleeding, bruising, enlarged lymph nodes.  Physical Exam  BP 136/88   Pulse 88   Temp 97.6 F (36.4 C)   Resp 16   Ht 5\' 3"  (1.6 m)   Wt 226 lb 12.8 oz (102.9 kg)   LMP 11/26/2011   BMI 40.18 kg/m   General Appearance: Over nourished, well groomed and in no apparent distress.  Eyes: PERRLA, EOMs, conjunctiva no swelling or erythema, normal fundi and vessels. Sinuses: No frontal/maxillary tenderness ENT/Mouth: EACs patent / TMs  nl. Nares clear without erythema, swelling, mucoid exudates. Oral hygiene is good. No erythema, swelling, or exudate. Tongue normal, non-obstructing. Tonsils not swollen or erythematous. Hearing normal.  Neck: Supple, thyroid not palpable. No bruits, nodes or JVD. Respiratory: Respiratory effort normal.  BS equal and clear bilateral without rales, rhonci, wheezing or stridor. Cardio: Heart sounds are normal with regular rate and rhythm and no murmurs, rubs or gallops. Peripheral pulses are normal and equal bilaterally without edema. No aortic or femoral bruits. Chest: symmetric with normal excursions and percussion. Breasts: Symmetric, without lumps, nipple discharge, retractions, or fibrocystic changes.  Abdomen: Rotund, soft with bowel sounds active. Nontender, no guarding, rebound, hernias, masses, or organomegaly.  Lymphatics: Non tender  without lymphadenopathy.  Musculoskeletal: Full ROM all peripheral extremities, joint stability, 5/5 strength, and normal gait. Skin: Warm and dry without rashes, lesions, cyanosis, clubbing or  ecchymosis.  Neuro: Cranial nerves intact, reflexes equal bilaterally. Normal muscle tone, no cerebellar symptoms. Sensation intact.  Pysch: Alert and oriented X 3, normal affect, Insight and Judgment appropriate.   Assessment and Plan  1. Annual Preventative Screening Examination  2. Labile hypertension  - Rx - Lisinopril/Hct  20/12.5 - EKG 12-Lead - [place], RETROPERITNL ABD,  LTD - Urinalysis, Routine w reflex microscopic - Microalbumin / creatinine urine ratio - CBC with Differential/Platelet - COMPLETE METABOLIC PANEL WITH GFR - Magnesium - TSH  3. Hyperlipidemia, mixed  - EKG 12-Lead - [place], RETROPERITNL ABD,  LTD - Lipid panel - TSH  4. Abnormal glucose  - EKG 12-Lead - [place], RETROPERITNL ABD,  LTD - Hemoglobin A1c - Insulin, random  5. Vitamin D deficiency  - VITAMIN D 25 Hydroxy  6. Class 2 severe obesity due to excess calories with serious comorbidity  and body mass index (BMI) of 39.0 to 39.9 in adult [facility])  - Rx Topamax 50 mg  -1/2 to 1 tab bid  (already on Ritalin)  7. Prediabetes  - EKG 12-Lead - [place], RETROPERITNL ABD,  LTD - Hemoglobin A1c - Insulin, random  8. Screening-pulmonary TB  - TB Skin Test  9. Screening for ischemic heart disease  - EKG 12-Lead  10. FH: hypertension  - EKG 12-Lead - [place], RETROPERITNL ABD,  LTD  11. Former smoker  - EKG 12-Lead - [place], RETROPERITNL ABD,  LTD  12. Screening for AAA (aortic abdominal aneurysm)  - [place], RETROPERITNL ABD,  LTD  13. Fatigue  - Iron,Total/Total Iron Binding Cap - Vitamin B12 - CBC with Differential/Platelet - TSH  14. Medication management  - Urinalysis, Routine w reflex microscopic - Microalbumin / creatinine urine ratio - CBC with Differential/Platelet - COMPLETE METABOLIC PANEL WITH  GFR - Magnesium - Lipid panel - TSH - Hemoglobin A1c - Insulin, random - VITAMIN D 25 Hydroxy   15. Screening for colorectal cancer  - POC Hemoccult Bld/Stl         Patient was counseled in prudent diet to achieve/maintain BMI less than 25 for weight control, BP monitoring, regular exercise and medications. Discussed med's effects and SE's. Screening labs and tests as requested with regular follow-up as recommended. Over 40 minutes of exam, counseling, chart review and high complex critical decision making was performed.   Kirtland Bouchard, MD

## 2019-11-28 NOTE — Patient Instructions (Signed)
Vit D  & Vit C 1,000 mg   are recommended to help protect  against the Covid-19 and other Corona viruses.    Also it's recommended  to take  Zinc 50 mg  to help  protect against the Covid-19   and best place to get  is also on [email or web address]  and don't pay more than 6-8 cents /pill !  ================================ Coronavirus (COVID-19) Are you at risk?  Are you at risk for the Coronavirus (COVID-19)?  To be considered HIGH RISK for Coronavirus (COVID-19), you have to meet the following criteria:  . Traveled to [place], [place], [place], [place] or [place]; or in the [place] to [place], [place], [place]  . or [place]; and have fever, cough, and shortness of breath within the last 2 weeks of travel OR . Been in close contact with a person diagnosed with COVID-19 within the last 2 weeks and have  . fever, cough,and shortness of breath .  . IF YOU DO NOT MEET THESE CRITERIA, YOU ARE CONSIDERED LOW RISK FOR COVID-19.  What to do if you are HIGH RISK for COVID-19?  Marland Kitchen If you are having a medical emergency, call 911. . Seek medical care right away. Before you go to a doctor's office, urgent care or emergency department, .  call ahead and tell them about your recent travel, contact with someone diagnosed with COVID-19  .  and your symptoms.  . You should receive instructions from your physician's office regarding next steps of care.  . When you arrive at healthcare provider, tell the healthcare staff immediately you have returned from  . visiting [place], [place], [place], [place] or [place]; or traveled in the [place] to [place], [place],  . [place] or [place] in the last two weeks or you have been in close contact with a person diagnosed with  . COVID-19 in the last 2 weeks.   . Tell the health care staff about your symptoms: fever, cough and shortness of breath. . After you have been seen by a medical provider, you will be either: o Tested for (COVID-19) and  discharged home on quarantine except to seek medical care if  o symptoms worsen, and asked to  - Stay home and avoid contact with others until you get your results (4-5 days)  - Avoid travel on public transportation if possible (such as bus, train, or airplane) or o Sent to the Emergency Department by EMS for evaluation, COVID-19 testing  and  o possible admission depending on your condition and test results.  What to do if you are LOW RISK for COVID-19?  Reduce your risk of any infection by using the same precautions used for avoiding the common cold or flu:  Marland Kitchen Wash your hands often with soap and warm water for at least 20 seconds.  If soap and water are not readily available,  . use an alcohol-based hand sanitizer with at least 60% alcohol.  . If coughing or sneezing, cover your mouth and nose by coughing or sneezing into the elbow areas of your shirt or coat, .  into a tissue or into your sleeve (not your hands). . Avoid shaking hands with others and consider head nods or verbal greetings only. . Avoid touching your eyes, nose, or mouth with unwashed hands.  . Avoid close contact with people who are sick. . Avoid places or events with large numbers of people in one location, like concerts or sporting events. Marland Kitchen  Carefully consider travel plans you have or are making. . If you are planning any travel outside or inside the [place], visit the CDC's Travelers' Health webpage for the latest health notices. . If you have some symptoms but not all symptoms, continue to monitor at home and seek medical attention  . if your symptoms worsen. . If you are having a medical emergency, call 911. >>>>>>>>>>>>>>>>>>>>>>> Preventive Care for Adults  A healthy lifestyle and preventive care can promote health and wellness. Preventive health guidelines for women include the following key practices.  A routine yearly physical is a good way to check with your health care provider about your health and preventive  screening. It is a chance to share any concerns and updates on your health and to receive a thorough exam.  Visit your dentist for a routine exam and preventive care every 6 months. Brush your teeth twice a day and floss once a day. Good oral hygiene prevents tooth decay and gum disease.  The frequency of eye exams is based on your age, health, family medical history, use of contact lenses, and other factors. Follow your health care provider's recommendations for frequency of eye exams.  Eat a healthy diet. Foods like vegetables, fruits, whole grains, low-fat dairy products, and lean protein foods contain the nutrients you need without too many calories. Decrease your intake of foods high in solid fats, added sugars, and salt. Eat the right amount of calories for you. Get information about a proper diet from your health care provider, if necessary.  Regular physical exercise is one of the most important things you can do for your health. Most adults should get at least 150 minutes of moderate-intensity exercise (any activity that increases your heart rate and causes you to sweat) each week. In addition, most adults need muscle-strengthening exercises on 2 or more days a week.  Maintain a healthy weight. The body mass index (BMI) is a screening tool to identify possible weight problems. It provides an estimate of body fat based on height and weight. Your health care provider can find your BMI and can help you achieve or maintain a healthy weight. For adults 20 years and older:  A BMI below 18.5 is considered underweight.  A BMI of 18.5 to 24.9 is normal.  A BMI of 25 to 29.9 is considered overweight.  A BMI of 30 and above is considered obese.  Maintain normal blood lipids and cholesterol levels by exercising and minimizing your intake of saturated fat. Eat a balanced diet with plenty of fruit and vegetables. Blood tests for lipids and cholesterol should begin at age 39 and be repeated every 5  years. If your lipid or cholesterol levels are high, you are over 50, or you are at high risk for heart disease, you may need your cholesterol levels checked more frequently. Ongoing high lipid and cholesterol levels should be treated with medicines if diet and exercise are not working.  If you smoke, find out from your health care provider how to quit. If you do not use tobacco, do not start.  Lung cancer screening is recommended for adults aged 41-80 years who are at high risk for developing lung cancer because of a history of smoking. A yearly low-dose CT scan of the lungs is recommended for people who have at least a 30-pack-year history of smoking and are a current smoker or have quit within the past 15 years. A pack year of smoking is smoking an average of 1 pack  of cigarettes a day for 1 year (for example: 1 pack a day for 30 years or 2 packs a day for 15 years). Yearly screening should continue until the smoker has stopped smoking for at least 15 years. Yearly screening should be stopped for people who develop a health problem that would prevent them from having lung cancer treatment.  High blood pressure causes heart disease and increases the risk of stroke. Your blood pressure should be checked at least every 1 to 2 years. Ongoing high blood pressure should be treated with medicines if weight loss and exercise do not work.  If you are 15-64 years old, ask your health care provider if you should take aspirin to prevent strokes.  Diabetes screening involves taking a blood sample to check your fasting blood sugar level. This should be done once every 3 years, after age 74, if you are within normal weight and without risk factors for diabetes. Testing should be considered at a younger age or be carried out more frequently if you are overweight and have at least 1 risk factor for diabetes.  Breast cancer screening is essential preventive care for women. You should practice "breast self-awareness."  This means understanding the normal appearance and feel of your breasts and may include breast self-examination. Any changes detected, no matter how small, should be reported to a health care provider. Women in their 97s and 30s should have a clinical breast exam (CBE) by a health care provider as part of a regular health exam every 1 to 3 years. After age 48, women should have a CBE every year. Starting at age 53, women should consider having a mammogram (breast X-ray test) every year. Women who have a family history of breast cancer should talk to their health care provider about genetic screening. Women at a high risk of breast cancer should talk to their health care providers about having an MRI and a mammogram every year.  Breast cancer gene (BRCA)-related cancer risk assessment is recommended for women who have family members with BRCA-related cancers. BRCA-related cancers include breast, ovarian, tubal, and peritoneal cancers. Having family members with these cancers may be associated with an increased risk for harmful changes (mutations) in the breast cancer genes BRCA1 and BRCA2. Results of the assessment will determine the need for genetic counseling and BRCA1 and BRCA2 testing.  Routine pelvic exams to screen for cancer are no longer recommended for nonpregnant women who are considered low risk for cancer of the pelvic organs (ovaries, uterus, and vagina) and who do not have symptoms. Ask your health care provider if a screening pelvic exam is right for you.  If you have had past treatment for cervical cancer or a condition that could lead to cancer, you need Pap tests and screening for cancer for at least 20 years after your treatment. If Pap tests have been discontinued, your risk factors (such as having a new sexual partner) need to be reassessed to determine if screening should be resumed. Some women have medical problems that increase the chance of getting cervical cancer. In these cases, your  health care provider may recommend more frequent screening and Pap tests.  Colorectal cancer can be detected and often prevented. Most routine colorectal cancer screening begins at the age of 24 years and continues through age 31 years. However, your health care provider may recommend screening at an earlier age if you have risk factors for colon cancer. On a yearly basis, your health care provider may provide home  test kits to check for hidden blood in the stool. Use of a small camera at the end of a tube, to directly examine the colon (sigmoidoscopy or colonoscopy), can detect the earliest forms of colorectal cancer. Talk to your health care provider about this at age 25, when routine screening begins.  Direct exam of the colon should be repeated every 5-10 years through age 15 years, unless early forms of pre-cancerous polyps or small growths are found.  Hepatitis C blood testing is recommended for all people born from [birth year] through 1965 and any individual with known risks for hepatitis C.  Pra  Osteoporosis is a disease in which the bones lose minerals and strength with aging. This can result in serious bone fractures or breaks. The risk of osteoporosis can be identified using a bone density scan. Women ages 73 years and over and women at risk for fractures or osteoporosis should discuss screening with their health care providers. Ask your health care provider whether you should take a calcium supplement or vitamin D to reduce the rate of osteoporosis.  Menopause can be associated with physical symptoms and risks. Hormone replacement therapy is available to decrease symptoms and risks. You should talk to your health care provider about whether hormone replacement therapy is right for you.  Use sunscreen. Apply sunscreen liberally and repeatedly throughout the day. You should seek shade when your shadow is shorter than you. Protect yourself by wearing long sleeves, pants, a wide-brimmed hat, and  sunglasses year round, whenever you are outdoors.  Once a month, do a whole body skin exam, using a mirror to look at the skin on your back. Tell your health care provider of new moles, moles that have irregular borders, moles that are larger than a pencil eraser, or moles that have changed in shape or color.  Stay current with required vaccines (immunizations).  Influenza vaccine. All adults should be immunized every year.  Tetanus, diphtheria, and acellular pertussis (Td, Tdap) vaccine. Pregnant women should receive 1 dose of Tdap vaccine during each pregnancy. The dose should be obtained regardless of the length of time since the last dose. Immunization is preferred during the 27th-36th week of gestation. An adult who has not previously received Tdap or who does not know her vaccine status should receive 1 dose of Tdap. This initial dose should be followed by tetanus and diphtheria toxoids (Td) booster doses every 10 years. Adults with an unknown or incomplete history of completing a 3-dose immunization series with Td-containing vaccines should begin or complete a primary immunization series including a Tdap dose. Adults should receive a Td booster every 10 years.  Varicella vaccine. An adult without evidence of immunity to varicella should receive 2 doses or a second dose if she has previously received 1 dose. Pregnant females who do not have evidence of immunity should receive the first dose after pregnancy. This first dose should be obtained before leaving the health care facility. The second dose should be obtained 4-8 weeks after the first dose.  Human papillomavirus (HPV) vaccine. Females aged 13-26 years who have not received the vaccine previously should obtain the 3-dose series. The vaccine is not recommended for use in pregnant females. However, pregnancy testing is not needed before receiving a dose. If a female is found to be pregnant after receiving a dose, no treatment is needed. In that  case, the remaining doses should be delayed until after the pregnancy. Immunization is recommended for any person with an immunocompromised condition through the  age of 39 years if she did not get any or all doses earlier. During the 3-dose series, the second dose should be obtained 4-8 weeks after the first dose. The third dose should be obtained 24 weeks after the first dose and 16 weeks after the second dose.  Zoster vaccine. One dose is recommended for adults aged 51 years or older unless certain conditions are present.  Measles, mumps, and rubella (MMR) vaccine. Adults born before [birth year] generally are considered immune to measles and mumps. Adults born in [birth year] or later should have 1 or more doses of MMR vaccine unless there is a contraindication to the vaccine or there is laboratory evidence of immunity to each of the three diseases. A routine second dose of MMR vaccine should be obtained at least 28 days after the first dose for students attending postsecondary schools, health care workers, or international travelers. People who received inactivated measles vaccine or an unknown type of measles vaccine during 1963-1967 should receive 2 doses of MMR vaccine. People who received inactivated mumps vaccine or an unknown type of mumps vaccine before 1979 and are at high risk for mumps infection should consider immunization with 2 doses of MMR vaccine. For females of childbearing age, rubella immunity should be determined. If there is no evidence of immunity, females who are not pregnant should be vaccinated. If there is no evidence of immunity, females who are pregnant should delay immunization until after pregnancy. Unvaccinated health care workers born before [birth year] who lack laboratory evidence of measles, mumps, or rubella immunity or laboratory confirmation of disease should consider measles and mumps immunization with 2 doses of MMR vaccine or rubella immunization with 1 dose of MMR vaccine.  Pneumococcal  13-valent conjugate (PCV13) vaccine. When indicated, a person who is uncertain of her immunization history and has no record of immunization should receive the PCV13 vaccine. An adult aged 54 years or older who has certain medical conditions and has not been previously immunized should receive 1 dose of PCV13 vaccine. This PCV13 should be followed with a dose of pneumococcal polysaccharide (PPSV23) vaccine. The PPSV23 vaccine dose should be obtained at least 1 or more year(s) after the dose of PCV13 vaccine. An adult aged 33 years or older who has certain medical conditions and previously received 1 or more doses of PPSV23 vaccine should receive 1 dose of PCV13. The PCV13 vaccine dose should be obtained 1 or more years after the last PPSV23 vaccine dose.    Pneumococcal polysaccharide (PPSV23) vaccine. When PCV13 is also indicated, PCV13 should be obtained first. All adults aged 77 years and older should be immunized. An adult younger than age 54 years who has certain medical conditions should be immunized. Any person who resides in a nursing home or long-term care facility should be immunized. An adult smoker should be immunized. People with an immunocompromised condition and certain other conditions should receive both PCV13 and PPSV23 vaccines. People with human immunodeficiency virus (HIV) infection should be immunized as soon as possible after diagnosis. Immunization during chemotherapy or radiation therapy should be avoided. Routine use of PPSV23 vaccine is not recommended for American Indians, [place] Natives, or people younger than 65 years unless there are medical conditions that require PPSV23 vaccine. When indicated, people who have unknown immunization and have no record of immunization should receive PPSV23 vaccine. One-time revaccination 5 years after the first dose of PPSV23 is recommended for people aged 19-64 years who have chronic kidney failure, nephrotic syndrome, asplenia, or  immunocompromised conditions.  People who received 1-2 doses of PPSV23 before age 3 years should receive another dose of PPSV23 vaccine at age 63 years or later if at least 5 years have passed since the previous dose. Doses of PPSV23 are not needed for people immunized with PPSV23 at or after age 42 years.  Preventive Services / Frequency   Ages 8 to 33 years  Blood pressure check.  Lipid and cholesterol check.  Lung cancer screening. / Every year if you are aged 74-80 years and have a 30-pack-year history of smoking and currently smoke or have quit within the past 15 years. Yearly screening is stopped once you have quit smoking for at least 15 years or develop a health problem that would prevent you from having lung cancer treatment.  Clinical breast exam.** / Every year after age 41 years.   BRCA-related cancer risk assessment.** / For women who have family members with a BRCA-related cancer (breast, ovarian, tubal, or peritoneal cancers).  Mammogram.** / Every year beginning at age 19 years and continuing for as long as you are in good health. Consult with your health care provider.  Pap test.** / Every 3 years starting at age 45 years through age 60 or 55 years with a history of 3 consecutive normal Pap tests.  HPV screening.** / Every 3 years from ages 75 years through ages 83 to 67 years with a history of 3 consecutive normal Pap tests.  Fecal occult blood test (FOBT) of stool. / Every year beginning at age 50 years and continuing until age 9 years. You may not need to do this test if you get a colonoscopy every 10 years.  Flexible sigmoidoscopy or colonoscopy.** / Every 5 years for a flexible sigmoidoscopy or every 10 years for a colonoscopy beginning at age 65 years and continuing until age 82 years.  Hepatitis C blood test.** / For all people born from [birth year] through 1964 and any individual with known risks for hepatitis C.  Skin self-exam. / Monthly.  Influenza vaccine. /  Every year.  Tetanus, diphtheria, and acellular pertussis (Tdap/Td) vaccine.** / Consult your health care provider. Pregnant women should receive 1 dose of Tdap vaccine during each pregnancy. 1 dose of Td every 10 years.  Varicella vaccine.** / Consult your health care provider. Pregnant females who do not have evidence of immunity should receive the first dose after pregnancy.  Zoster vaccine.** / 1 dose for adults aged 52 years or older.  Pneumococcal 13-valent conjugate (PCV13) vaccine.** / Consult your health care provider.  Pneumococcal polysaccharide (PPSV23) vaccine.** / 1 to 2 doses if you smoke cigarettes or if you have certain conditions.  Meningococcal vaccine.** / Consult your health care provider.  Hepatitis A vaccine.** / Consult your health care provider.  Hepatitis B vaccine.** / Consult your health care provider. Screening for abdominal aortic aneurysm (AAA)  by ultrasound is recommended for people over 50 who have history of high blood pressure or who are current or former smokers. ++++++++++++++++++ Recommend Adult Low Dose Aspirin or  coated  Aspirin 81 mg daily  To reduce risk of Colon Cancer 40 %,  Skin Cancer 26 % ,  Melanoma 46%  and  Pancreatic cancer 60% +++++++++++++++++++ Vitamin D goal  is between 70-100.  Please make sure that you are taking your Vitamin D as directed.  It is very important as a natural anti-inflammatory  helping hair, skin, and nails, as well as reducing stroke and heart attack risk.  It helps your bones and  helps with mood. It also decreases numerous cancer risks so please take it as directed.  Low Vit D is associated with a 200-300% higher risk for CANCER  and 200-300% higher risk for HEART   ATTACK  &  STROKE.   .....................................Marland Kitchen It is also associated with higher death rate at younger ages,  autoimmune diseases like Rheumatoid arthritis, Lupus, Multiple Sclerosis.    Also many other serious conditions, like  depression, Alzheimer's Dementia, infertility, muscle aches, fatigue, fibromyalgia - just to name a few. ++++++++++++++++++ Recommend the book "The END of DIETING" by Dr Excell Seltzer  & the book "The END of DIABETES " by Dr Excell Seltzer At [email or web address] - get book & Audio CD's    Being diabetic has a  300% increased risk for heart attack, stroke, cancer, and alzheimer- type vascular dementia. It is very important that you work harder with diet by avoiding all foods that are white. Avoid white rice (brown & wild rice is OK), white potatoes (sweetpotatoes in moderation is OK), White bread or wheat bread or anything made out of white flour like bagels, donuts, rolls, buns, biscuits, cakes, pastries, cookies, pizza crust, and pasta (made from white flour & egg whites) - vegetarian pasta or spinach or wheat pasta is OK. Multigrain breads like Arnold's or [employer], or multigrain sandwich thins or flatbreads.  Diet, exercise and weight loss can reverse and cure diabetes in the early stages.  Diet, exercise and weight loss is very important in the control and prevention of complications of diabetes which affects every system in your body, ie. Brain - dementia/stroke, eyes - glaucoma/blindness, heart - heart attack/heart failure, kidneys - dialysis, stomach - gastric paralysis, intestines - malabsorption, nerves - severe painful neuritis, circulation - gangrene & loss of a leg(s), and finally cancer and Alzheimers.    I recommend avoid fried & greasy foods,  sweets/candy, white rice (brown or wild rice or Quinoa is OK), white potatoes (sweet potatoes are OK) - anything made from white flour - bagels, doughnuts, rolls, buns, biscuits,white and wheat breads, pizza crust and traditional pasta made of white flour & egg white(vegetarian pasta or spinach or wheat pasta is OK).  Multi-grain bread is OK - like multi-grain flat bread or sandwich thins. Avoid alcohol in excess. Exercise is also important.    Eat all the  vegetables you want - avoid meat, especially red meat and dairy - especially cheese.  Cheese is the most concentrated form of trans-fats which is the worst thing to clog up our arteries. Veggie cheese is OK which can be found in the fresh produce section at [employer] or [employer] or [employer]  ++++++++++++++++++++++ DASH Eating Plan  DASH stands for "Dietary Approaches to Stop Hypertension."   The DASH eating plan is a healthy eating plan that has been shown to reduce high blood pressure (hypertension). Additional health benefits may include reducing the risk of type 2 diabetes mellitus, heart disease, and stroke. The DASH eating plan may also help with weight loss. WHAT DO I NEED TO KNOW ABOUT THE DASH EATING PLAN? For the DASH eating plan, you will follow these general guidelines:  Choose foods with a percent daily value for sodium of less than 5% (as listed on the food label).  Use salt-free seasonings or herbs instead of table salt or sea salt.  Check with your health care provider or pharmacist before using salt substitutes.  Eat lower-sodium products, often labeled as "lower sodium" or "no salt added."  Eat  fresh foods.  Eat more vegetables, fruits, and low-fat dairy products.  Choose whole grains. Look for the word "whole" as the first word in the ingredient list.  Choose fish   Limit sweets, desserts, sugars, and sugary drinks.  Choose heart-healthy fats.  Eat veggie cheese   Eat more home-cooked food and less restaurant, buffet, and fast food.  Limit fried foods.  Cook foods using methods other than frying.  Limit canned vegetables. If you do use them, rinse them well to decrease the sodium.  When eating at a restaurant, ask that your food be prepared with less salt, or no salt if possible.                      WHAT FOODS CAN I EAT? Read Dr Fara Olden Fuhrman's books on The End of Dieting & The End of Diabetes  Grains Whole grain or whole wheat bread. Brown  rice. Whole grain or whole wheat pasta. Quinoa, bulgur, and whole grain cereals. Low-sodium cereals. Corn or whole wheat flour tortillas. Whole grain cornbread. Whole grain crackers. Low-sodium crackers.  Vegetables Fresh or frozen vegetables (raw, steamed, roasted, or grilled). Low-sodium or reduced-sodium tomato and vegetable juices. Low-sodium or reduced-sodium tomato sauce and paste. Low-sodium or reduced-sodium canned vegetables.   Fruits All fresh, canned (in natural juice), or frozen fruits.  Protein Products  All fish and seafood.  Dried beans, peas, or lentils. Unsalted nuts and seeds. Unsalted canned beans.  Dairy Low-fat dairy products, such as skim or 1% milk, 2% or reduced-fat cheeses, low-fat ricotta or cottage cheese, or plain low-fat yogurt. Low-sodium or reduced-sodium cheeses.  Fats and Oils Tub margarines without trans fats. Light or reduced-fat mayonnaise and salad dressings (reduced sodium). Avocado. Safflower, olive, or canola oils. Natural peanut or almond butter.  Other Unsalted popcorn and pretzels. The items listed above may not be a complete list of recommended foods or beverages. Contact your dietitian for more options.  ++++++++++++++++++  WHAT FOODS ARE NOT RECOMMENDED? Grains/ White flour or wheat flour White bread. White pasta. White rice. Refined cornbread. Bagels and croissants. Crackers that contain trans fat.  Vegetables  Creamed or fried vegetables. Vegetables in a . Regular canned vegetables. Regular canned tomato sauce and paste. Regular tomato and vegetable juices.  Fruits Dried fruits. Canned fruit in light or heavy syrup. Fruit juice.  Meat and Other Protein Products Meat in general - RED meat & White meat.  Fatty cuts of meat. Ribs, chicken wings, all processed meats as bacon, sausage, bologna, salami, fatback, hot dogs, bratwurst and packaged luncheon meats.  Dairy Whole or 2% milk, cream, half-and-half, and cream cheese. Whole-fat or  sweetened yogurt. Full-fat cheeses or blue cheese. Non-dairy creamers and whipped toppings. Processed cheese, cheese spreads, or cheese curds.  Condiments Onion and garlic salt, seasoned salt, table salt, and sea salt. Canned and packaged gravies. Worcestershire sauce. Tartar sauce. Barbecue sauce. Teriyaki sauce. Soy sauce, including reduced sodium. Steak sauce. Fish sauce. Oyster sauce. Cocktail sauce. Horseradish. Ketchup and mustard. Meat flavorings and tenderizers. Bouillon cubes. Hot sauce. Tabasco sauce. Marinades. Taco seasonings. Relishes.  Fats and Oils Butter, stick margarine, lard, shortening and bacon fat. Coconut, palm kernel, or palm oils. Regular salad dressings.  Pickles and olives. Salted popcorn and pretzels.  The items listed above may not be a complete list of foods and beverages to avoid.

## 2019-11-29 ENCOUNTER — Other Ambulatory Visit: Payer: Self-pay

## 2019-11-29 ENCOUNTER — Ambulatory Visit (INDEPENDENT_AMBULATORY_CARE_PROVIDER_SITE_OTHER): Payer: BC Managed Care – PPO | Admitting: Internal Medicine

## 2019-11-29 VITALS — BP 136/88 | HR 88 | Temp 97.6°F | Resp 16 | Ht 63.0 in | Wt 226.8 lb

## 2019-11-29 DIAGNOSIS — Z131 Encounter for screening for diabetes mellitus: Secondary | ICD-10-CM | POA: Diagnosis not present

## 2019-11-29 DIAGNOSIS — Z23 Encounter for immunization: Secondary | ICD-10-CM | POA: Diagnosis not present

## 2019-11-29 DIAGNOSIS — R7309 Other abnormal glucose: Secondary | ICD-10-CM

## 2019-11-29 DIAGNOSIS — Z1389 Encounter for screening for other disorder: Secondary | ICD-10-CM | POA: Diagnosis not present

## 2019-11-29 DIAGNOSIS — E559 Vitamin D deficiency, unspecified: Secondary | ICD-10-CM

## 2019-11-29 DIAGNOSIS — Z113 Encounter for screening for infections with a predominantly sexual mode of transmission: Secondary | ICD-10-CM

## 2019-11-29 DIAGNOSIS — Z1231 Encounter for screening mammogram for malignant neoplasm of breast: Secondary | ICD-10-CM

## 2019-11-29 DIAGNOSIS — Z87891 Personal history of nicotine dependence: Secondary | ICD-10-CM

## 2019-11-29 DIAGNOSIS — Z1211 Encounter for screening for malignant neoplasm of colon: Secondary | ICD-10-CM

## 2019-11-29 DIAGNOSIS — Z0000 Encounter for general adult medical examination without abnormal findings: Secondary | ICD-10-CM

## 2019-11-29 DIAGNOSIS — R0989 Other specified symptoms and signs involving the circulatory and respiratory systems: Secondary | ICD-10-CM

## 2019-11-29 DIAGNOSIS — Z111 Encounter for screening for respiratory tuberculosis: Secondary | ICD-10-CM | POA: Diagnosis not present

## 2019-11-29 DIAGNOSIS — Z130 Encounter for screening for diseases of the blood and blood-forming organs and certain disorders involving the immune mechanism: Secondary | ICD-10-CM | POA: Diagnosis not present

## 2019-11-29 DIAGNOSIS — Z124 Encounter for screening for malignant neoplasm of cervix: Secondary | ICD-10-CM

## 2019-11-29 DIAGNOSIS — Z1329 Encounter for screening for other suspected endocrine disorder: Secondary | ICD-10-CM

## 2019-11-29 DIAGNOSIS — Z13220 Encounter for screening for lipoid disorders: Secondary | ICD-10-CM

## 2019-11-29 DIAGNOSIS — Z8249 Family history of ischemic heart disease and other diseases of the circulatory system: Secondary | ICD-10-CM | POA: Diagnosis not present

## 2019-11-29 DIAGNOSIS — Z136 Encounter for screening for cardiovascular disorders: Secondary | ICD-10-CM

## 2019-11-29 DIAGNOSIS — Z79899 Other long term (current) drug therapy: Secondary | ICD-10-CM | POA: Diagnosis not present

## 2019-11-29 DIAGNOSIS — R5383 Other fatigue: Secondary | ICD-10-CM

## 2019-11-29 DIAGNOSIS — E782 Mixed hyperlipidemia: Secondary | ICD-10-CM

## 2019-11-29 DIAGNOSIS — R7303 Prediabetes: Secondary | ICD-10-CM

## 2019-11-29 DIAGNOSIS — Z0001 Encounter for general adult medical examination with abnormal findings: Secondary | ICD-10-CM

## 2019-11-29 DIAGNOSIS — M8580 Other specified disorders of bone density and structure, unspecified site: Secondary | ICD-10-CM

## 2019-11-29 MED ORDER — LISINOPRIL-HYDROCHLOROTHIAZIDE 20-12.5 MG PO TABS: ORAL_TABLET | ORAL | 1 refills | Status: DC

## 2019-11-29 MED ORDER — TOPIRAMATE 50 MG PO TABS: ORAL_TABLET | ORAL | 1 refills | Status: DC

## 2019-11-30 ENCOUNTER — Encounter: Payer: Self-pay | Admitting: Gastroenterology

## 2019-11-30 LAB — PAP, TP IMAGING, WNL RFLX HPV

## 2019-11-30 LAB — CBC WITH DIFFERENTIAL/PLATELET
Absolute Monocytes: 504 cells/uL (ref 200–950)
Basophils Absolute: 42 cells/uL (ref 0–200)
Basophils Relative: 0.6 %
Eosinophils Absolute: 357 cells/uL (ref 15–500)
Eosinophils Relative: 5.1 %
HCT: 39.2 % (ref 35.0–45.0)
Hemoglobin: 13 g/dL (ref 11.7–15.5)
Lymphs Abs: 2618 cells/uL (ref 850–3900)
MCH: 31.5 pg (ref 27.0–33.0)
MCHC: 33.2 g/dL (ref 32.0–36.0)
MCV: 94.9 fL (ref 80.0–100.0)
MPV: 10.7 fL (ref 7.5–12.5)
Monocytes Relative: 7.2 %
Neutro Abs: 3479 cells/uL (ref 1500–7800)
Neutrophils Relative %: 49.7 %
Platelets: 282 10*3/uL (ref 140–400)
RBC: 4.13 10*6/uL (ref 3.80–5.10)
RDW: 11.9 % (ref 11.0–15.0)
Total Lymphocyte: 37.4 %
WBC: 7 10*3/uL (ref 3.8–10.8)

## 2019-11-30 LAB — URINALYSIS, ROUTINE W REFLEX MICROSCOPIC
Bacteria, UA: NONE SEEN /HPF
Bilirubin Urine: NEGATIVE
Glucose, UA: NEGATIVE
Hgb urine dipstick: NEGATIVE
Hyaline Cast: NONE SEEN /LPF
Nitrite: NEGATIVE
Protein, ur: NEGATIVE
Specific Gravity, Urine: 1.024 (ref 1.001–1.03)
pH: <=5 (ref 5.0–8.0)

## 2019-11-30 LAB — PAP, TP IMAGING W/ HPV RNA, RFLX HPV TYPE 16,18/45: HPV DNA High Risk: NOT DETECTED

## 2019-11-30 LAB — LIPID PANEL
Cholesterol: 162 mg/dL (ref <200)
HDL: 68 mg/dL (ref >=50)
LDL Cholesterol (Calc): 76 mg/dL (calc)
Non-HDL Cholesterol (Calc): 94 mg/dL (calc) (ref <130)
Total CHOL/HDL Ratio: 2.4 (calc) (ref <5.0)
Triglycerides: 97 mg/dL (ref <150)

## 2019-11-30 LAB — COMPLETE METABOLIC PANEL WITH GFR
AG Ratio: 1.7 (calc) (ref 1.0–2.5)
ALT: 28 U/L (ref 6–29)
AST: 25 U/L (ref 10–35)
Albumin: 4.3 g/dL (ref 3.6–5.1)
Alkaline phosphatase (APISO): 61 U/L (ref 37–153)
BUN: 12 mg/dL (ref 7–25)
CO2: 27 mmol/L (ref 20–32)
Calcium: 9.1 mg/dL (ref 8.6–10.4)
Chloride: 108 mmol/L (ref 98–110)
Creat: 0.79 mg/dL (ref 0.50–0.99)
GFR, Est African American: 93 mL/min/{1.73_m2} (ref >=60)
GFR, Est Non African American: 80 mL/min/{1.73_m2} (ref >=60)
Globulin: 2.5 g/dL (calc) (ref 1.9–3.7)
Glucose, Bld: 100 mg/dL — ABNORMAL HIGH (ref 65–99)
Potassium: 4.4 mmol/L (ref 3.5–5.3)
Sodium: 143 mmol/L (ref 135–146)
Total Bilirubin: 0.9 mg/dL (ref 0.2–1.2)
Total Protein: 6.8 g/dL (ref 6.1–8.1)

## 2019-11-30 LAB — IRON, TOTAL/TOTAL IRON BINDING CAP
%SAT: 45 % (calc) (ref 16–45)
Iron: 129 ug/dL (ref 45–160)
TIBC: 287 mcg/dL (calc) (ref 250–450)

## 2019-11-30 LAB — VITAMIN B12: Vitamin B-12: 681 pg/mL (ref 200–1100)

## 2019-11-30 LAB — HEMOGLOBIN A1C
Hgb A1c MFr Bld: 5.8 % of total Hgb — ABNORMAL HIGH (ref <5.7)
Mean Plasma Glucose: 120 (calc)
eAG (mmol/L): 6.6 (calc)

## 2019-11-30 LAB — VITAMIN D 25 HYDROXY (VIT D DEFICIENCY, FRACTURES): Vit D, 25-Hydroxy: 56 ng/mL (ref 30–100)

## 2019-11-30 LAB — MICROALBUMIN / CREATININE URINE RATIO
Creatinine, Urine: 275 mg/dL (ref 20–275)
Microalb Creat Ratio: 3 mcg/mg creat (ref <30)
Microalb, Ur: 0.9 mg/dL

## 2019-11-30 LAB — MAGNESIUM: Magnesium: 2.1 mg/dL (ref 1.5–2.5)

## 2019-11-30 LAB — INSULIN, RANDOM: Insulin: 12.6 u[IU]/mL

## 2019-11-30 LAB — TSH: TSH: 0.89 mIU/L (ref 0.40–4.50)

## 2019-12-06 DIAGNOSIS — F331 Major depressive disorder, recurrent, moderate: Secondary | ICD-10-CM | POA: Diagnosis not present

## 2019-12-06 DIAGNOSIS — F900 Attention-deficit hyperactivity disorder, predominantly inattentive type: Secondary | ICD-10-CM | POA: Diagnosis not present

## 2019-12-09 ENCOUNTER — Other Ambulatory Visit: Payer: Self-pay

## 2019-12-09 ENCOUNTER — Ambulatory Visit (AMBULATORY_SURGERY_CENTER): Payer: Self-pay | Admitting: *Deleted

## 2019-12-09 VITALS — Temp 96.6°F | Ht 63.0 in | Wt 227.0 lb

## 2019-12-09 DIAGNOSIS — Z01818 Encounter for other preprocedural examination: Secondary | ICD-10-CM

## 2019-12-09 DIAGNOSIS — Z86010 Personal history of colonic polyps: Secondary | ICD-10-CM

## 2019-12-09 MED ORDER — NA SULFATE-K SULFATE-MG SULF 17.5-3.13-1.6 GM/177ML PO SOLN: ORAL | 0 refills | Status: DC

## 2019-12-09 NOTE — Progress Notes (Signed)
Patient is here in-person for PV. Patient denies any allergies to eggs or soy. Patient denies any problems with anesthesia/sedation. Patient denies any oxygen use at home. Patient denies taking any diet/weight loss medications or blood thinners. Patient is not being treated for MRSA or C-diff. EMMI education assisgned to the patient for the procedure, this was explained and instructions given to patient. COVID-19 screening test is on 2/15, the pt is aware. Pt is aware that care partner will wait in the car during procedure; if they feel like they will be too hot or cold to wait in the car; they may wait in the 4 th floor lobby. Patient is aware to bring only one care partner. We want them to wear a mask (we do not have any that we can provide them), practice social distancing, and we will check their temperatures when they get here.  I did remind the patient that their care partner needs to stay in the parking lot the entire time and have a cell phone available, we will call them when the pt is ready for discharge. Patient will wear mask into building.    Suprep $15 off coupon given to the patient.

## 2019-12-20 ENCOUNTER — Ambulatory Visit (INDEPENDENT_AMBULATORY_CARE_PROVIDER_SITE_OTHER): Payer: BC Managed Care – PPO

## 2019-12-20 ENCOUNTER — Other Ambulatory Visit: Payer: Self-pay | Admitting: Gastroenterology

## 2019-12-20 DIAGNOSIS — Z1159 Encounter for screening for other viral diseases: Secondary | ICD-10-CM

## 2019-12-21 LAB — SARS CORONAVIRUS 2 (TAT 6-24 HRS): SARS Coronavirus 2: NEGATIVE

## 2019-12-22 ENCOUNTER — Encounter: Payer: Self-pay | Admitting: Gastroenterology

## 2019-12-23 ENCOUNTER — Encounter: Payer: Self-pay | Admitting: Gastroenterology

## 2019-12-27 ENCOUNTER — Encounter: Payer: Self-pay | Admitting: Gastroenterology

## 2019-12-27 ENCOUNTER — Ambulatory Visit (AMBULATORY_SURGERY_CENTER): Payer: BC Managed Care – PPO | Admitting: Gastroenterology

## 2019-12-27 ENCOUNTER — Other Ambulatory Visit: Payer: Self-pay

## 2019-12-27 VITALS — BP 117/71 | HR 79 | Temp 96.6°F | Resp 18 | Ht 63.0 in | Wt 227.0 lb

## 2019-12-27 DIAGNOSIS — Z86010 Personal history of colonic polyps: Secondary | ICD-10-CM | POA: Diagnosis not present

## 2019-12-27 DIAGNOSIS — D123 Benign neoplasm of transverse colon: Secondary | ICD-10-CM

## 2019-12-27 DIAGNOSIS — K635 Polyp of colon: Secondary | ICD-10-CM | POA: Diagnosis not present

## 2019-12-27 DIAGNOSIS — Z1211 Encounter for screening for malignant neoplasm of colon: Secondary | ICD-10-CM | POA: Diagnosis not present

## 2019-12-27 MED ORDER — SODIUM CHLORIDE 0.9 % IV SOLN: 500.0000 mL | Freq: Once | INTRAVENOUS | Status: DC

## 2019-12-27 NOTE — Progress Notes (Signed)
PT taken to PACU. Monitors in place. VSS. Report given to RN. 

## 2019-12-27 NOTE — Patient Instructions (Signed)
Please read handouts provided. Continue present medications. Await pathology results.        YOU HAD AN ENDOSCOPIC PROCEDURE TODAY AT THE [facility]:   Refer to the procedure report that was given to you for any specific questions about what was found during the examination.  If the procedure report does not answer your questions, please call your gastroenterologist to clarify.  If you requested that your care partner not be given the details of your procedure findings, then the procedure report has been included in a sealed envelope for you to review at your convenience later.  YOU SHOULD EXPECT: Some feelings of bloating in the abdomen. Passage of more gas than usual.  Walking can help get rid of the air that was put into your GI tract during the procedure and reduce the bloating. If you had a lower endoscopy (such as a colonoscopy or flexible sigmoidoscopy) you may notice spotting of blood in your stool or on the toilet paper. If you underwent a bowel prep for your procedure, you may not have a normal bowel movement for a few days.  Please Note:  You might notice some irritation and congestion in your nose or some drainage.  This is from the oxygen used during your procedure.  There is no need for concern and it should clear up in a day or so.  SYMPTOMS TO REPORT IMMEDIATELY:   Following lower endoscopy (colonoscopy or flexible sigmoidoscopy):  Excessive amounts of blood in the stool  Significant tenderness or worsening of abdominal pains  Swelling of the abdomen that is new, acute  Fever of 100F or higher    For urgent or emergent issues, a gastroenterologist can be reached at any hour by calling [phone].   DIET:  We do recommend a small meal at first, but then you may proceed to your regular diet.  Drink plenty of fluids but you should avoid alcoholic beverages for 24 hours.  ACTIVITY:  You should plan to take it easy for the rest of today and you should NOT  DRIVE or use heavy machinery until tomorrow (because of the sedation medicines used during the test).    FOLLOW UP: Our staff will call the number listed on your records 48-72 hours following your procedure to check on you and address any questions or concerns that you may have regarding the information given to you following your procedure. If we do not reach you, we will leave a message.  We will attempt to reach you two times.  During this call, we will ask if you have developed any symptoms of COVID 19. If you develop any symptoms (ie: fever, flu-like symptoms, shortness of breath, cough etc.) before then, please call [phone].  If you test positive for Covid 19 in the 2 weeks post procedure, please call and report this information to us.    If any biopsies were taken you will be contacted by phone or by letter within the next 1-3 weeks.  Please call us at [phone] if you have not heard about the biopsies in 3 weeks.    SIGNATURES/CONFIDENTIALITY: You and/or your care partner have signed paperwork which will be entered into your electronic medical record.  These signatures attest to the fact that that the information above on your After Visit Summary has been reviewed and is understood.  Full responsibility of the confidentiality of this discharge information lies with you and/or your care-partner. 

## 2019-12-27 NOTE — Op Note (Signed)
[name] Procedure Date: 12/27/2019 7:56 AM MRN: [id number] Endoscopist: Mauri Pole , MD Age: 64 Referring MD:  Date of Birth: 06-11-1956 Gender: Female Account #: [id number] Procedure:                Colonoscopy Indications:              High risk colon cancer surveillance: Personal                            history of colonic polyps, High risk colon cancer                            surveillance: Personal history of adenoma less than                            10 mm in size Medicines:                Monitored Anesthesia Care Procedure:                Pre-Anesthesia Assessment:                           - Prior to the procedure, a History and Physical                            was performed, and patient medications and                            allergies were reviewed. The patient's tolerance of                            previous anesthesia was also reviewed. The risks                            and benefits of the procedure and the sedation                            options and risks were discussed with the patient.                            All questions were answered, and informed consent                            was obtained. Prior Anticoagulants: The patient has                            taken no previous anticoagulant or antiplatelet                            agents. ASA Grade Assessment: III - A patient with                            severe systemic disease. After reviewing the risks  and benefits, the patient was deemed in                            satisfactory condition to undergo the procedure.                           After obtaining informed consent, the colonoscope                            was passed under direct vision. Throughout the                            procedure, the patient's blood pressure, pulse, and                            oxygen saturations were monitored  continuously. The                            Colonoscope was introduced through the anus and                            advanced to the the cecum, identified by                            appendiceal orifice and ileocecal valve. The                            colonoscopy was performed without difficulty. The                            patient tolerated the procedure well. The quality                            of the bowel preparation was excellent. The                            ileocecal valve, appendiceal orifice, and rectum                            were photographed. Scope In: 8:20:55 AM Scope Out: 8:42:05 AM Scope Withdrawal Time: 0 hours 15 minutes 51 seconds  Total Procedure Duration: 0 hours 21 minutes 10 seconds  Findings:                 The perianal and digital rectal examinations were                            normal.                           A 11 mm polyp was found in the hepatic flexure. The                            polyp was sessile. The polyp was removed with a  cold snare. Resection and retrieval were complete.                           Two sessile polyps were found in the transverse                            colon and ascending colon. The polyps were 1 to 2                            mm in size. These polyps were removed with a cold                            biopsy forceps. Resection and retrieval were                            complete.                           Scattered small-mouthed diverticula were found in                            the sigmoid colon and descending colon.                           Non-bleeding internal hemorrhoids were found during                            retroflexion. The hemorrhoids were small. Complications:            No immediate complications. Estimated Blood Loss:     Estimated blood loss was minimal. Impression:               - One 11 mm polyp at the hepatic flexure, removed                             with a cold snare. Resected and retrieved.                           - Two 1 to 2 mm polyps in the transverse colon and                            in the ascending colon, removed with a cold biopsy                            forceps. Resected and retrieved.                           - Diverticulosis in the sigmoid colon and in the                            descending colon.                           - Non-bleeding internal hemorrhoids. Recommendation:           -  Patient has a contact number available for                            emergencies. The signs and symptoms of potential                            delayed complications were discussed with the                            patient. Return to normal activities tomorrow.                            Written discharge instructions were provided to the                            patient.                           - Resume previous diet.                           - Continue present medications.                           - Await pathology results.                           - Repeat colonoscopy in 3 - 5 years for                            surveillance based on pathology results. Mauri Pole, MD 12/27/2019 8:49:31 AM This report has been signed electronically.

## 2019-12-27 NOTE — Progress Notes (Signed)
Called to room to assist during endoscopic procedure.  Patient ID and intended procedure confirmed with present staff. Received instructions for my participation in the procedure from the performing physician.  

## 2019-12-27 NOTE — Progress Notes (Signed)
Temp-LC VS-CW  Pt's states no medical or surgical changes since previsit or office visit.  

## 2019-12-29 ENCOUNTER — Telehealth: Payer: Self-pay | Admitting: *Deleted

## 2019-12-29 ENCOUNTER — Telehealth: Payer: Self-pay

## 2019-12-29 NOTE — Telephone Encounter (Signed)
  Follow up Call-  Call back number 12/27/2019  Post procedure Call Back phone  # [phone]  Permission to leave phone message Yes  Some recent data might be hidden     Patient questions:  Do you have a fever, pain , or abdominal swelling? No. Pain Score  0 *  Have you tolerated food without any problems? Yes.    Have you been able to return to your normal activities? Yes.    Do you have any questions about your discharge instructions: Diet   No. Medications  No. Follow up visit  No.  Do you have questions or concerns about your Care? No.  Actions: * If pain score is 4 or above: No action needed, pain <4.  1. Have you developed a fever since your procedure? no  2.   Have you had an respiratory symptoms (SOB or cough) since your procedure? no  3.   Have you tested positive for COVID 19 since your procedure no  4.   Have you had any family members/close contacts diagnosed with the COVID 19 since your procedure?  no   If yes to any of these questions please route to Joylene John, RN and Alphonsa Gin, Therapist, sports.

## 2019-12-29 NOTE — Telephone Encounter (Signed)
First attempt, left VM.  

## 2019-12-31 ENCOUNTER — Encounter: Payer: Self-pay | Admitting: Gastroenterology

## 2020-01-14 ENCOUNTER — Ambulatory Visit
Admission: RE | Admit: 2020-01-14 | Discharge: 2020-01-14 | Disposition: A | Discharge status: Home / Self Care | Payer: BC Managed Care – PPO | Source: Ambulatory Visit | Attending: Internal Medicine | Admitting: Internal Medicine

## 2020-01-14 ENCOUNTER — Other Ambulatory Visit: Payer: Self-pay

## 2020-01-14 DIAGNOSIS — Z1231 Encounter for screening mammogram for malignant neoplasm of breast: Secondary | ICD-10-CM

## 2020-01-14 IMAGING — MG DIGITAL SCREENING BILAT W/ CAD
2 series · 2 of 2 positions shown · non-contrast
Comparison: Previous exam(s).

CLINICAL DATA: Screening.

EXAM:
DIGITAL SCREENING BILATERAL MAMMOGRAM WITH CAD

[L MLO]
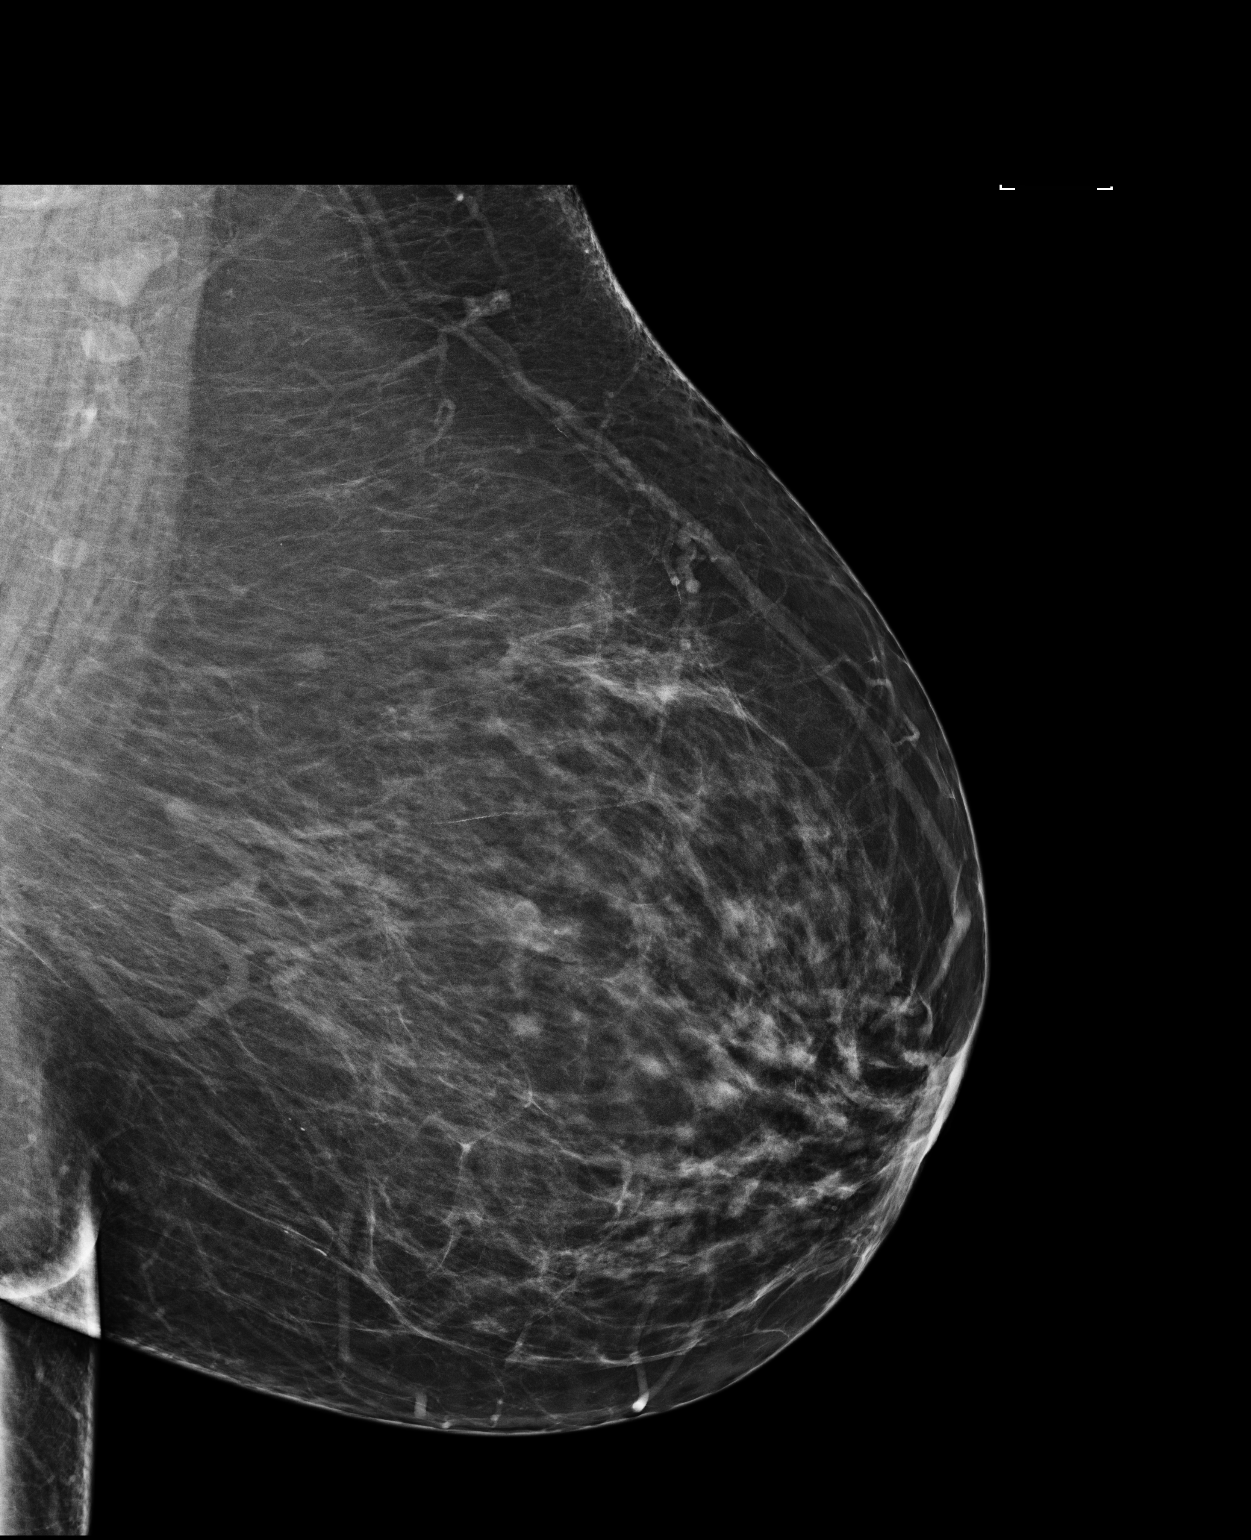

[L CC]
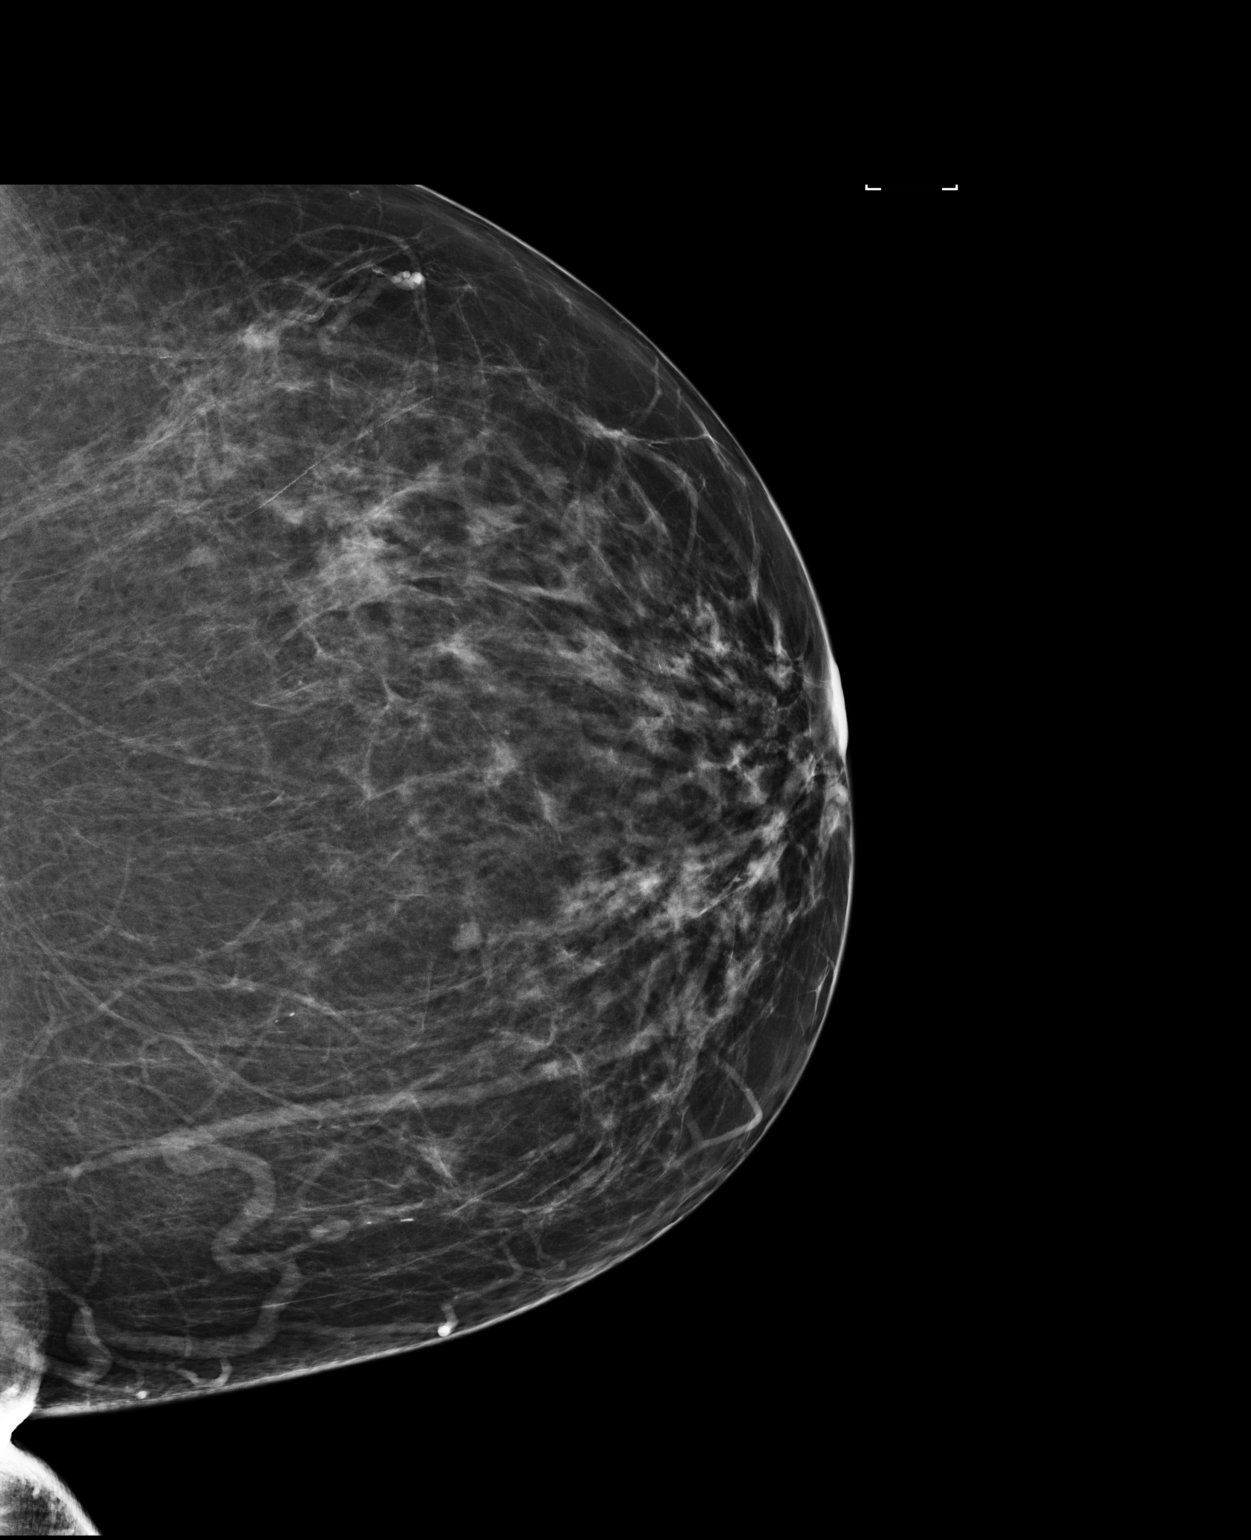

[2 of 2 positions shown; findings below may reference images not displayed]

ACR Breast Density Category b: There are scattered areas of
fibroglandular density.
FINDINGS: There are no findings suspicious for malignancy. Images were
processed with CAD.
IMPRESSION: No mammographic evidence of malignancy. A result letter of this
screening mammogram will be mailed directly to the patient.

RECOMMENDATION:
Screening mammogram in one year. (Code:[US])

BI-RADS CATEGORY  1: Negative.

## 2020-01-25 ENCOUNTER — Other Ambulatory Visit: Payer: Self-pay | Admitting: Internal Medicine

## 2020-01-25 MED ORDER — ROSUVASTATIN CALCIUM 40 MG PO TABS: ORAL_TABLET | ORAL | 0 refills | Status: DC

## 2020-02-25 ENCOUNTER — Ambulatory Visit
Admission: RE | Admit: 2020-02-25 | Discharge: 2020-02-25 | Disposition: A | Discharge status: Home / Self Care | Payer: BC Managed Care – PPO | Source: Ambulatory Visit | Attending: Internal Medicine | Admitting: Internal Medicine

## 2020-02-25 ENCOUNTER — Other Ambulatory Visit: Payer: Self-pay

## 2020-02-25 DIAGNOSIS — M8580 Other specified disorders of bone density and structure, unspecified site: Secondary | ICD-10-CM

## 2020-02-25 DIAGNOSIS — Z780 Asymptomatic menopausal state: Secondary | ICD-10-CM | POA: Diagnosis not present

## 2020-02-25 DIAGNOSIS — M85852 Other specified disorders of bone density and structure, left thigh: Secondary | ICD-10-CM | POA: Diagnosis not present

## 2020-02-28 DIAGNOSIS — J45909 Unspecified asthma, uncomplicated: Secondary | ICD-10-CM | POA: Insufficient documentation

## 2020-02-28 NOTE — Progress Notes (Signed)
FOLLOW UP  Assessment and Plan:   Essential hypertension - continue medications, DASH diet, exercise and monitor at home. Call if greater than 130/80.  -     CBC with Differential/Platelet -     COMPLETE METABOLIC PANEL WITH GFR -     TSH  Vitamin D deficiency Continue supplement  Hyperlipidemia, unspecified hyperlipidemia type check lipids, on rosuvastatin  decrease fatty foods increase activity.  -     Lipid panel  Major depressive disorder, recurrent, severe without psychotic features ([place]) Continue medications, well controlled  Abnormal glucose Discussed disease and risks Discussed diet/exercise, weight management  A1C q82m; defer today, monitor weight, serum glucose   Morbid obesity - BMI 38 with co morbidities Long discussion about weight loss, diet, and exercise Discussed ideal weight for height (below 140 lb) and initial weight goal (<200 lb) Patient will work on increasing exercise, portions Doing well with topamax Will follow up in 3 months      Continue diet and meds as discussed. Further disposition pending results of labs. Over 30 minutes of exam, counseling, chart review, and critical decision making was performed  Future Appointments  Date Time Provider [place]  06/01/2020 11:30 AM Unk Pinto, MD [facility] None     HPI 64 y.o. female  presents for 3 month follow up on hypertension, cholesterol, prediabetes, morbid obesity and vitamin D deficiency.   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 She is taking symbicort and xyzal.   Patient is treated by Dr Chucky May for  ADD / Depression with Fluoxetine, buproprion & Methylphenidate. Hx of steroid induced depression.   BMI is Body mass index is 38.26 kg/m., she has been working on diet and exercise,  is walking in small chunks as tolerated. Having some back spasms and following with ortho. She is prescribed topiramate for weight loss benefits and perceives benefit, denies SE. She is down 11 lb since last visit.  Wt Readings from Last 3 Encounters:  03/01/20 216 lb (98 kg)  12/27/19 227 lb (103 kg)  12/09/19 227 lb (103 kg)   Her blood pressure has been controlled at home, today their BP is BP: 112/70   She does not workout. She denies chest pain, shortness of breath, dizziness.   She  is  on cholesterol medication, on crestor 40mg  and denies myalgias. Her cholesterol is at goal. The cholesterol last visit was:   Lab Results  Component Value Date   CHOL 162 11/29/2019   HDL 68 11/29/2019   LDLCALC 76 11/29/2019   TRIG 97 11/29/2019   CHOLHDL 2.4 11/29/2019    She has been working on diet and exercise for prediabetes, and denies paresthesia of the feet, polydipsia, polyuria and visual disturbances. Last A1C in the office was:  Lab Results  Component Value Date   HGBA1C 5.8 (H) 11/29/2019   Patient is on Vitamin D supplement.   Lab Results  Component Value Date   VD25OH 56 11/29/2019       Current Medications:  Current Outpatient Medications on File Prior to Visit  Medication Sig  . albuterol (VENTOLIN HFA) 108 (90 Base) MCG/ACT inhaler Inhale 2 puffs into the lungs every 4 (four) hours as needed for wheezing or shortness of breath.  . Ascorbic Acid (VITAMIN C PO) Take 1 tablet by mouth.  . B Complex Vitamins (VITAMIN  B-COMPLEX PO) Take 1 tablet by mouth daily.  Marland Kitchen buPROPion (WELLBUTRIN XL) 150 MG 24 hr tablet Take 150 mg by mouth every morning.  . Calcium Carbonate (CALCIUM 600 PO) Take by mouth daily.  . cyclobenzaprine (FLEXERIL) 10 MG tablet Take 1 tablet (10 mg total) by mouth 3 (three) times daily as needed for muscle spasms.  Marland Kitchen FLUoxetine (PROZAC) 20 MG capsule Take 1 capsule (20 mg total) by mouth daily. For depression  . fluticasone (FLONASE) 50 MCG/ACT nasal spray  SHAKE LQ AND U 1 TO 2 SPRAYS IEN QD  . levocetirizine (XYZAL) 5 MG tablet TK 1 T PO QD IN THE EVE  . lisinopril-hydrochlorothiazide (ZESTORETIC) 20-12.5 MG tablet Take 1 tablet every Morning for BP  . Magnesium 400 MG TABS Take 1 tablet by mouth daily.  . methylphenidate (RITALIN) 10 MG tablet TK 1 T PO BID  . naproxen (NAPROSYN) 500 MG tablet Take 1 tablet (500 mg total) by mouth 2 (two) times daily as needed for mild pain, moderate pain or headache (TAKE WITH MEALS.).  Marland Kitchen rosuvastatin (CRESTOR) 40 MG tablet Take 1 tablet Daily for Cholesterol  . SYMBICORT 160-4.5 MCG/ACT inhaler INL 2 PFS PO BID  . topiramate (TOPAMAX) 50 MG tablet Take 1/2 to 1 tablet 2 x /day at Suppertime & Bedtime for Dieting & Weight Loss  . VITAMIN D PO Take 10,000 Units by mouth daily.   No current facility-administered medications on file prior to visit.    Medical History:  Past Medical History:  Diagnosis Date  . Anxiety   . Arthritis   . Asthma   . Cataract   . Gout   . Hyperlipidemia   . Hypertension   . Obese    Allergies:  Allergies  Allergen Reactions  . Molds & Smuts Anaphylaxis  . Biaxin [Clarithromycin]     GI upset  . Serevent [Salmeterol]     Palpitations  . Tequin [Gatifloxacin]     GI upset. thrush     Review of Systems:  Review of Systems  Constitutional: Negative.  Negative for malaise/fatigue and weight loss.  HENT: Negative.  Negative for hearing loss and tinnitus.   Eyes: Negative.  Negative for blurred vision and double vision.  Respiratory: Negative.  Negative for cough, shortness of breath and wheezing.   Cardiovascular: Negative.  Negative for chest pain, palpitations, orthopnea, claudication and leg swelling.  Gastrointestinal: Negative.  Negative for abdominal pain, blood in stool, constipation, diarrhea, heartburn, melena, nausea and vomiting.  Genitourinary: Negative.   Musculoskeletal: Negative.  Negative for joint pain and myalgias.  Skin: Negative.  Negative for  rash.  Neurological: Negative for dizziness, tingling, sensory change, weakness and headaches.  Endo/Heme/Allergies: Positive for environmental allergies. Negative for polydipsia.  Psychiatric/Behavioral: Negative for depression, substance abuse and suicidal ideas. The patient is not nervous/anxious.   All other systems reviewed and are negative.   Family history- Review and unchanged Social history- Review and unchanged Physical Exam: BP 112/70   Pulse 78   Temp (!) 97 F (36.1 C)   Ht 5\' 3"  (1.6 m)   Wt 216 lb (98 kg)   LMP 11/26/2011   SpO2 97%   BMI 38.26 kg/m  Wt Readings from Last 3 Encounters:  03/01/20 216 lb (98 kg)  12/27/19 227 lb (103 kg)  12/09/19 227 lb (103 kg)   General Appearance: Well nourished, in no apparent distress. Eyes: PERRLA, EOMs, conjunctiva no swelling or erythema Sinuses: No Frontal/maxillary tenderness ENT/Mouth: Ext aud canals clear,  TMs without erythema, bulging. No erythema, swelling, or exudate on post pharynx.  Tonsils not swollen or erythematous. Hearing normal.  Neck: Supple, thyroid normal.  Respiratory: Respiratory effort normal, BS equal bilaterally without rales, rhonchi, wheezing or stridor.  Cardio: RRR with no MRGs. Brisk peripheral pulses without edema.  Abdomen: Soft, + BS,  Non tender, no guarding, rebound, hernias, masses. Lymphatics: Non tender without lymphadenopathy.  Musculoskeletal: Full ROM, 5/5 strength, Normal gait, right arm in cast Skin: Warm, dry without rashes, lesions, ecchymosis.  Neuro: Cranial nerves intact. Normal muscle tone, no cerebellar symptoms. Psych: Awake and oriented X 3, normal affect, Insight and Judgment appropriate.    Izora Ribas, NP 11:42 AM [facility]

## 2020-03-01 ENCOUNTER — Ambulatory Visit (INDEPENDENT_AMBULATORY_CARE_PROVIDER_SITE_OTHER): Payer: BLUE CROSS/BLUE SHIELD | Admitting: Adult Health

## 2020-03-01 ENCOUNTER — Encounter: Payer: Self-pay | Admitting: Adult Health

## 2020-03-01 ENCOUNTER — Other Ambulatory Visit: Payer: Self-pay

## 2020-03-01 VITALS — BP 112/70 | HR 78 | Temp 97.0°F | Ht 63.0 in | Wt 216.0 lb

## 2020-03-01 DIAGNOSIS — E785 Hyperlipidemia, unspecified: Secondary | ICD-10-CM

## 2020-03-01 DIAGNOSIS — E559 Vitamin D deficiency, unspecified: Secondary | ICD-10-CM | POA: Diagnosis not present

## 2020-03-01 DIAGNOSIS — J45909 Unspecified asthma, uncomplicated: Secondary | ICD-10-CM

## 2020-03-01 DIAGNOSIS — Z79899 Other long term (current) drug therapy: Secondary | ICD-10-CM | POA: Diagnosis not present

## 2020-03-01 DIAGNOSIS — F332 Major depressive disorder, recurrent severe without psychotic features: Secondary | ICD-10-CM | POA: Diagnosis not present

## 2020-03-01 DIAGNOSIS — I10 Essential (primary) hypertension: Secondary | ICD-10-CM

## 2020-03-01 DIAGNOSIS — R7309 Other abnormal glucose: Secondary | ICD-10-CM

## 2020-03-01 DIAGNOSIS — F419 Anxiety disorder, unspecified: Secondary | ICD-10-CM

## 2020-03-01 NOTE — Patient Instructions (Addendum)
Goals   None      [employer] with no obligation # [phone] Do not have to be a member Tues-Sat 10-6  [place]- free test with no obligation # [phone] MUST BE A MEMBER Call for store hours  Have had patient's get good cheaper hearing aids from [employer] The air version has good reviews.         Osteopenia  Osteopenia is a loss of thickness (density) inside of the bones. Another name for osteopenia is low bone mass. Mild osteopenia is a normal part of aging. It is not a disease, and it does not cause symptoms. However, if you have osteopenia and continue to lose bone mass, you could develop a condition that causes the bones to become thin and break more easily (osteoporosis). You may also lose some height, have back pain, and have a stooped posture. Although osteopenia is not a disease, making changes to your lifestyle and diet can help to prevent osteopenia from developing into osteoporosis. What are the causes? Osteopenia is caused by loss of calcium in the bones.  Bones are constantly changing. Old bone cells are continually being replaced with new bone cells. This process builds new bone. The mineral calcium is needed to build new bone and maintain bone density. Bone density is usually highest around age 71. After that, most people's bodies cannot replace all the bone they have lost with new bone. What increases the risk? You are more likely to develop this condition if:  You are older than age 35.  You are a woman who went through menopause early.  You have a long illness that keeps you in bed.  You do not get enough exercise.  You lack certain nutrients (malnutrition).  You have an overactive thyroid gland (hyperthyroidism).  You smoke.  You drink a lot of alcohol.  You are taking medicines that weaken the bones, such as steroids. What are the signs or symptoms? This condition does not cause any symptoms. You may have  a slightly higher risk for bone breaks (fractures), so getting fractures more easily than normal may be an indication of osteopenia. How is this diagnosed? Your health care provider can diagnose this condition with a special type of X-ray exam that measures bone density (dual-energy X-ray absorptiometry, DEXA). This test can measure bone density in your hips, spine, and wrists. Osteopenia has no symptoms, so this condition is usually diagnosed after a routine bone density screening test is done for osteoporosis. This routine screening is usually done for:  Women who are age 92 or older.  Men who are age 45 or older. If you have risk factors for osteopenia, you may have the screening test at an earlier age. How is this treated? Making dietary and lifestyle changes can lower your risk for osteoporosis. If you have severe osteopenia that is close to becoming osteoporosis, your health care provider may prescribe medicines and dietary supplements such as calcium and vitamin D. These supplements help to rebuild bone density. Follow these instructions at home:   Take over-the-counter and prescription medicines only as told by your health care provider. These include vitamins and supplements.  Eat a diet that is high in calcium and vitamin D. ? Calcium is found in dairy products, beans, salmon, and leafy green vegetables like spinach and broccoli. ? Look for foods that have vitamin D and calcium added to them (fortified foods), such as orange juice, cereal, and bread.  Do 30  or more minutes of a weight-bearing exercise every day, such as walking, jogging, or playing a sport. These types of exercises strengthen the bones.  Take precautions at home to lower your risk of falling, such as: ? Keeping rooms well-lit and free of clutter, such as cords. ? Installing safety rails on stairs. ? Using rubber mats in the bathroom or other areas that are often wet or slippery.  Do not use any products that  contain nicotine or tobacco, such as cigarettes and e-cigarettes. If you need help quitting, ask your health care provider.  Avoid alcohol or limit alcohol intake to no more than 1 drink a day for nonpregnant women and 2 drinks a day for men. One drink equals 12 oz of beer, 5 oz of wine, or 1 oz of hard liquor.  Keep all follow-up visits as told by your health care provider. This is important. Contact a health care provider if:  You have not had a bone density screening for osteoporosis and you are: ? A woman, age 55 or older. ? A man, age 32 or older.  You are a postmenopausal woman who has not had a bone density screening for osteoporosis.  You are older than age 30 and you want to know if you should have bone density screening for osteoporosis. Summary  Osteopenia is a loss of thickness (density) inside of the bones. Another name for osteopenia is low bone mass.  Osteopenia is not a disease, but it may increase your risk for a condition that causes the bones to become thin and break more easily (osteoporosis).  You may be at risk for osteopenia if you are older than age 48 or if you are a woman who went through early menopause.  Osteopenia does not cause any symptoms, but it can be diagnosed with a bone density screening test.  Dietary and lifestyle changes are the first treatment for osteopenia. These may lower your risk for osteoporosis. This information is not intended to replace advice given to you by your health care provider. Make sure you discuss any questions you have with your health care provider. Document Revised: 10/03/2017 Document Reviewed: 07/30/2017 Elsevier Patient Education  2020 [employer].

## 2020-03-02 LAB — CBC WITH DIFFERENTIAL/PLATELET
Absolute Monocytes: 518 cells/uL (ref 200–950)
Basophils Absolute: 58 cells/uL (ref 0–200)
Basophils Relative: 0.8 %
Eosinophils Absolute: 655 cells/uL — ABNORMAL HIGH (ref 15–500)
Eosinophils Relative: 9.1 %
HCT: 37.4 % (ref 35.0–45.0)
Hemoglobin: 12.5 g/dL (ref 11.7–15.5)
Lymphs Abs: 2534 cells/uL (ref 850–3900)
MCH: 32.2 pg (ref 27.0–33.0)
MCHC: 33.4 g/dL (ref 32.0–36.0)
MCV: 96.4 fL (ref 80.0–100.0)
MPV: 10.6 fL (ref 7.5–12.5)
Monocytes Relative: 7.2 %
Neutro Abs: 3434 cells/uL (ref 1500–7800)
Neutrophils Relative %: 47.7 %
Platelets: 288 10*3/uL (ref 140–400)
RBC: 3.88 10*6/uL (ref 3.80–5.10)
RDW: 12.5 % (ref 11.0–15.0)
Total Lymphocyte: 35.2 %
WBC: 7.2 10*3/uL (ref 3.8–10.8)

## 2020-03-02 LAB — COMPLETE METABOLIC PANEL WITH GFR
AG Ratio: 2 (calc) (ref 1.0–2.5)
ALT: 32 U/L — ABNORMAL HIGH (ref 6–29)
AST: 23 U/L (ref 10–35)
Albumin: 4.5 g/dL (ref 3.6–5.1)
Alkaline phosphatase (APISO): 55 U/L (ref 37–153)
BUN: 21 mg/dL (ref 7–25)
CO2: 27 mmol/L (ref 20–32)
Calcium: 9.7 mg/dL (ref 8.6–10.4)
Chloride: 108 mmol/L (ref 98–110)
Creat: 0.82 mg/dL (ref 0.50–0.99)
GFR, Est African American: 88 mL/min/{1.73_m2} (ref >=60)
GFR, Est Non African American: 76 mL/min/{1.73_m2} (ref >=60)
Globulin: 2.2 g/dL (calc) (ref 1.9–3.7)
Glucose, Bld: 96 mg/dL (ref 65–99)
Potassium: 4.6 mmol/L (ref 3.5–5.3)
Sodium: 142 mmol/L (ref 135–146)
Total Bilirubin: 0.8 mg/dL (ref 0.2–1.2)
Total Protein: 6.7 g/dL (ref 6.1–8.1)

## 2020-03-02 LAB — LIPID PANEL
Cholesterol: 169 mg/dL (ref <200)
HDL: 68 mg/dL (ref >=50)
LDL Cholesterol (Calc): 80 mg/dL (calc)
Non-HDL Cholesterol (Calc): 101 mg/dL (calc) (ref <130)
Total CHOL/HDL Ratio: 2.5 (calc) (ref <5.0)
Triglycerides: 117 mg/dL (ref <150)

## 2020-03-02 LAB — MAGNESIUM: Magnesium: 2.1 mg/dL (ref 1.5–2.5)

## 2020-03-02 LAB — TSH: TSH: 0.65 mIU/L (ref 0.40–4.50)

## 2020-03-27 DIAGNOSIS — J4530 Mild persistent asthma, uncomplicated: Secondary | ICD-10-CM | POA: Diagnosis not present

## 2020-03-27 DIAGNOSIS — Z9114 Patient's other noncompliance with medication regimen: Secondary | ICD-10-CM | POA: Diagnosis not present

## 2020-03-27 DIAGNOSIS — K219 Gastro-esophageal reflux disease without esophagitis: Secondary | ICD-10-CM | POA: Diagnosis not present

## 2020-03-27 DIAGNOSIS — J301 Allergic rhinitis due to pollen: Secondary | ICD-10-CM | POA: Diagnosis not present

## 2020-03-28 ENCOUNTER — Ambulatory Visit (INDEPENDENT_AMBULATORY_CARE_PROVIDER_SITE_OTHER): Payer: BC Managed Care – PPO

## 2020-03-28 ENCOUNTER — Other Ambulatory Visit: Payer: Self-pay

## 2020-03-28 VITALS — Wt 217.0 lb

## 2020-03-28 DIAGNOSIS — Z9229 Personal history of other drug therapy: Secondary | ICD-10-CM

## 2020-04-11 DIAGNOSIS — J301 Allergic rhinitis due to pollen: Secondary | ICD-10-CM | POA: Diagnosis not present

## 2020-04-17 DIAGNOSIS — J301 Allergic rhinitis due to pollen: Secondary | ICD-10-CM | POA: Diagnosis not present

## 2020-04-25 ENCOUNTER — Other Ambulatory Visit: Payer: Self-pay | Admitting: *Deleted

## 2020-04-25 DIAGNOSIS — J301 Allergic rhinitis due to pollen: Secondary | ICD-10-CM | POA: Diagnosis not present

## 2020-04-25 DIAGNOSIS — J3089 Other allergic rhinitis: Secondary | ICD-10-CM | POA: Diagnosis not present

## 2020-04-25 MED ORDER — ROSUVASTATIN CALCIUM 40 MG PO TABS: ORAL_TABLET | ORAL | 0 refills | Status: DC

## 2020-04-27 DIAGNOSIS — J301 Allergic rhinitis due to pollen: Secondary | ICD-10-CM | POA: Diagnosis not present

## 2020-04-27 DIAGNOSIS — J3089 Other allergic rhinitis: Secondary | ICD-10-CM | POA: Diagnosis not present

## 2020-05-02 DIAGNOSIS — J301 Allergic rhinitis due to pollen: Secondary | ICD-10-CM | POA: Diagnosis not present

## 2020-05-09 DIAGNOSIS — J301 Allergic rhinitis due to pollen: Secondary | ICD-10-CM | POA: Diagnosis not present

## 2020-05-11 DIAGNOSIS — J301 Allergic rhinitis due to pollen: Secondary | ICD-10-CM | POA: Diagnosis not present

## 2020-05-16 DIAGNOSIS — J301 Allergic rhinitis due to pollen: Secondary | ICD-10-CM | POA: Diagnosis not present

## 2020-05-18 DIAGNOSIS — J301 Allergic rhinitis due to pollen: Secondary | ICD-10-CM | POA: Diagnosis not present

## 2020-05-18 DIAGNOSIS — J3089 Other allergic rhinitis: Secondary | ICD-10-CM | POA: Diagnosis not present

## 2020-05-21 ENCOUNTER — Other Ambulatory Visit: Payer: Self-pay | Admitting: Internal Medicine

## 2020-05-21 DIAGNOSIS — R0989 Other specified symptoms and signs involving the circulatory and respiratory systems: Secondary | ICD-10-CM

## 2020-05-23 DIAGNOSIS — J3089 Other allergic rhinitis: Secondary | ICD-10-CM | POA: Diagnosis not present

## 2020-05-23 DIAGNOSIS — J301 Allergic rhinitis due to pollen: Secondary | ICD-10-CM | POA: Diagnosis not present

## 2020-05-25 DIAGNOSIS — J3089 Other allergic rhinitis: Secondary | ICD-10-CM | POA: Diagnosis not present

## 2020-05-25 DIAGNOSIS — J301 Allergic rhinitis due to pollen: Secondary | ICD-10-CM | POA: Diagnosis not present

## 2020-05-29 DIAGNOSIS — F3342 Major depressive disorder, recurrent, in full remission: Secondary | ICD-10-CM | POA: Diagnosis not present

## 2020-05-29 DIAGNOSIS — F900 Attention-deficit hyperactivity disorder, predominantly inattentive type: Secondary | ICD-10-CM | POA: Diagnosis not present

## 2020-05-31 ENCOUNTER — Encounter: Payer: Self-pay | Admitting: Internal Medicine

## 2020-05-31 DIAGNOSIS — J301 Allergic rhinitis due to pollen: Secondary | ICD-10-CM | POA: Diagnosis not present

## 2020-05-31 NOTE — Progress Notes (Signed)
History of Present Illness:       This very nice 64 y.o.  [name] presents for 6 month follow up with HTN, HLD, Morbid Obesity,  Pre-Diabetes and Vitamin D Deficiency. Patient is treated by Dr Chucky May for  ADD& Depression with Fluoxetine/ Bupropion & Methylphenidate.       Patient is treated for HTN  (2008) & BP has been controlled at home. Today's BP is at goal - 100/68. Patient has had no complaints of any cardiac type chest pain, palpitations, dyspnea / orthopnea / PND, dizziness, claudication, or dependent edema.      Hyperlipidemia is controlled with diet & Rosuvastatin. Patient denies myalgias or other med SE's. Last Lipids were at goal:  Lab Results  Component Value Date   CHOL 169 03/01/2020   HDL 68 03/01/2020   LDLCALC 80 03/01/2020   TRIG 117 03/01/2020   CHOLHDL 2.5 03/01/2020    Also, the patient has Morbid Obesity (BMI 39+) and consequent PreDiabetes (5.9% / 2014)  and has had no symptoms of reactive hypoglycemia, diabetic polys, paresthesias or visual blurring.  Last A1c was not at goal:  Lab Results  Component Value Date   HGBA1C 5.8 (H) 11/29/2019           Further, the patient also has history of Vitamin D Deficiency ("20" / 2010) and supplements vitamin D without any suspected side-effects. Last vitamin D was at goal:  Lab Results  Component Value Date   VD25OH 56 11/29/2019    Current Outpatient Medications on File Prior to Visit  Medication Sig  . albuterol (VENTOLIN HFA) 108 (90 Base) MCG/ACT inhaler Inhale 2 puffs into the lungs every 4 (four) hours as needed for wheezing or shortness of breath.  . Ascorbic Acid (VITAMIN C PO) Take 1 tablet by mouth.  . B Complex Vitamins (VITAMIN B-COMPLEX PO) Take 1 tablet by mouth daily.  Marland Kitchen buPROPion (WELLBUTRIN XL) 150 MG 24 hr tablet Take 150 mg by mouth every morning.  . Calcium Carbonate (CALCIUM 600 PO) Take by mouth daily.  . cyclobenzaprine (FLEXERIL) 10 MG tablet Take 1 tablet (10 mg total) by  mouth 3 (three) times daily as needed for muscle spasms.  Marland Kitchen FLUoxetine (PROZAC) 20 MG capsule Take 1 capsule (20 mg total) by mouth daily. For depression  . fluticasone (FLONASE) 50 MCG/ACT nasal spray SHAKE LQ AND U 1 TO 2 SPRAYS IEN QD  . levocetirizine (XYZAL) 5 MG tablet TK 1 T PO QD IN THE EVE  . lisinopril-hydrochlorothiazide (ZESTORETIC) 20-12.5 MG tablet Take 1 tablet Daily for  B  . Magnesium 400 MG TABS Take 1 tablet by mouth daily.  . methylphenidate (RITALIN) 10 MG tablet TK 1 T PO BID  . naproxen (NAPROSYN) 500 MG tablet Take 1 tablet (500 mg total) by mouth 2 (two) times daily as needed for mild pain, moderate pain or headache (TAKE WITH MEALS.).  Marland Kitchen rosuvastatin (CRESTOR) 40 MG tablet Take 1 tablet Daily for Cholesterol  . SYMBICORT 160-4.5 MCG/ACT inhaler INL 2 PFS PO BID  . topiramate (TOPAMAX) 50 MG tablet Take 1/2 to 1 tablet    2 x /day    at Suppertime & Bedtime for Dieting & Weight Loss  . VITAMIN D PO Take 10,000 Units by mouth daily.   No current facility-administered medications on file prior to visit.    Allergies  Allergen Reactions  . Molds & Smuts Anaphylaxis  . Biaxin [Clarithromycin]  GI upset  . Serevent [Salmeterol]     Palpitations  . Tequin [Gatifloxacin]     GI upset. thrush    PMHx:   Past Medical History:  Diagnosis Date  . Anxiety   . Arthritis   . Asthma   . Cataract   . Gout   . Hyperlipidemia   . Hypertension   . Obese     Immunization History  Administered Date(s) Administered  . Influenza,inj,Quad PF,6+ Mos 09/17/2017  . Influenza-Unspecified 08/20/2018  . PFIZER SARS-COV-2 Vaccination 01/20/2020, 02/10/2020  . PPD Test 10/08/2017, 11/29/2019  . Pneumococcal Conjugate-13 11/05/1995  . Pneumococcal Polysaccharide-23 10/08/2017  . Td 11/29/2019  . Tdap 11/04/2008, 03/28/2020    Past Surgical History:  Procedure Laterality Date  . APPENDECTOMY  1984  . CATARACT EXTRACTION W/ INTRAOCULAR LENS  IMPLANT, BILATERAL  2014    . [place]   X3  . COLONOSCOPY  11/16/2013   w/[place]   . FOOT SURGERY Left 2002  . KNEE SURGERY Left 1998  . ROTATOR CUFF REPAIR Right 2007  . TUBAL LIGATION  1994    FHx:    Reviewed / unchanged  SHx:    Reviewed / unchanged   Systems Review:  Constitutional: Denies fever, chills, wt changes, headaches, insomnia, fatigue, night sweats, change in appetite. Eyes: Denies redness, blurred vision, diplopia, discharge, itchy, watery eyes.  ENT: Denies discharge, congestion, post nasal drip, epistaxis, sore throat, earache, hearing loss, dental pain, tinnitus, vertigo, sinus pain, snoring.  CV: Denies chest pain, palpitations, irregular heartbeat, syncope, dyspnea, diaphoresis, orthopnea, PND, claudication or edema. Respiratory: denies cough, dyspnea, DOE, pleurisy, hoarseness, laryngitis, wheezing.  Gastrointestinal: Denies dysphagia, odynophagia, heartburn, reflux, water brash, abdominal pain or cramps, nausea, vomiting, bloating, diarrhea, constipation, hematemesis, melena, hematochezia  or hemorrhoids. Genitourinary: Denies dysuria, frequency, urgency, nocturia, hesitancy, discharge, hematuria or flank pain. Musculoskeletal: Denies arthralgias, myalgias, stiffness, jt. swelling, pain, limping or strain/sprain.  Skin: Denies pruritus, rash, hives, warts, acne, eczema or change in skin lesion(s). Neuro: No weakness, tremor, incoordination, spasms, paresthesia or pain. Psychiatric: Denies confusion, memory loss or sensory loss. Endo: Denies change in weight, skin or hair change.  Heme/Lymph: No excessive bleeding, bruising or enlarged lymph nodes.  Physical Exam  BP 100/68   P 88   T 97.2 F    R 16   Ht 5\' 3"     Wt (!) 209 lb 9.6 oz   BMI 37.13  Postural     Sit BP   112/68   P 91             &             Stand BP   103/78   P 93   Appears  over nourished, well groomed  and in no distress.  Eyes: PERRLA, EOMs, conjunctiva no swelling or  erythema. Sinuses: No frontal/maxillary tenderness ENT/Mouth: EAC's clear, TM's nl w/o erythema, bulging. Nares clear w/o erythema, swelling, exudates. Oropharynx clear without erythema or exudates. Oral hygiene is good. Tongue normal, non obstructing. Hearing intact.  Neck: Supple. Thyroid not palpable. Car 2+/2+ without bruits, nodes or JVD. Chest: Respirations nl with BS clear & equal w/o rales, rhonchi, wheezing or stridor.  Cor: Heart sounds normal w/ regular rate and rhythm without sig. murmurs, gallops, clicks or rubs. Peripheral pulses normal and equal  without edema.  Abdomen: Soft & bowel sounds normal. Non-tender w/o guarding, rebound, hernias, masses or organomegaly.  Lymphatics: Unremarkable.  Musculoskeletal: Full ROM all  peripheral extremities, joint stability, 5/5 strength and normal gait.  Skin: Warm, dry without exposed rashes, lesions or ecchymosis apparent.  Neuro: Cranial nerves intact, reflexes equal bilaterally. Sensory-motor testing grossly intact. Tendon reflexes grossly intact.  Pysch: Alert & oriented x 3.  Insight and judgement nl & appropriate. No ideations.  Assessment and Plan:  1. Essential hypertension  -  Recc decrease Zestoretic to 1/2 tab &  monitor BP's - Continue DASH diet.  Reminder to go to the ER if any CP,  SOB, nausea, dizziness, severe HA, changes vision/speech.  - CBC with Differential/Platelet - COMPLETE METABOLIC PANEL WITH GFR - Magnesium - TSH  2. Hyperlipidemia, mixed  - Continue diet/meds, exercise,& lifestyle modifications.  - Continue monitor periodic cholesterol/liver & renal functions   - Lipid panel - TSH  3. Abnormal glucose  - Continue diet, exercise  - Lifestyle modifications.  - Monitor appropriate labs.  - Hemoglobin A1c - Insulin, random  4. Vitamin D deficiency  - Continue supplementation.  - VITAMIN D 25 Hydroxy   5. Medication management  - CBC with Differential/Platelet - COMPLETE METABOLIC PANEL  WITH GFR - Magnesium - Lipid panel - TSH - Hemoglobin A1c - Insulin, random - VITAMIN D 25 Hydroxy        Discussed  regular exercise, BP monitoring, weight control to achieve/maintain BMI less than 25 and discussed med and SE's. Recommended labs to assess and monitor clinical status with further disposition pending results of labs.  I discussed the assessment and treatment plan with the patient. The patient was provided an opportunity to ask questions and all were answered. The patient agreed with the plan and demonstrated an understanding of the instructions.  I provided over 30 minutes of exam, counseling, chart review and  complex critical decision making.   Kirtland Bouchard, MD

## 2020-05-31 NOTE — Patient Instructions (Signed)

## 2020-06-01 ENCOUNTER — Other Ambulatory Visit: Payer: Self-pay

## 2020-06-01 ENCOUNTER — Ambulatory Visit (INDEPENDENT_AMBULATORY_CARE_PROVIDER_SITE_OTHER): Payer: BC Managed Care – PPO | Admitting: Internal Medicine

## 2020-06-01 VITALS — BP 100/68 | HR 88 | Temp 97.2°F | Resp 16 | Ht 63.0 in | Wt 209.6 lb

## 2020-06-01 DIAGNOSIS — E559 Vitamin D deficiency, unspecified: Secondary | ICD-10-CM

## 2020-06-01 DIAGNOSIS — R7309 Other abnormal glucose: Secondary | ICD-10-CM

## 2020-06-01 DIAGNOSIS — E782 Mixed hyperlipidemia: Secondary | ICD-10-CM

## 2020-06-01 DIAGNOSIS — Z6837 Body mass index (BMI) 37.0-37.9, adult: Secondary | ICD-10-CM

## 2020-06-01 DIAGNOSIS — I10 Essential (primary) hypertension: Secondary | ICD-10-CM

## 2020-06-01 DIAGNOSIS — Z79899 Other long term (current) drug therapy: Secondary | ICD-10-CM

## 2020-06-02 DIAGNOSIS — J3089 Other allergic rhinitis: Secondary | ICD-10-CM | POA: Diagnosis not present

## 2020-06-02 DIAGNOSIS — J301 Allergic rhinitis due to pollen: Secondary | ICD-10-CM | POA: Diagnosis not present

## 2020-06-02 LAB — HEMOGLOBIN A1C
Hgb A1c MFr Bld: 5.8 % of total Hgb — ABNORMAL HIGH (ref <5.7)
Mean Plasma Glucose: 120 (calc)
eAG (mmol/L): 6.6 (calc)

## 2020-06-02 LAB — LIPID PANEL
Cholesterol: 170 mg/dL (ref <200)
HDL: 64 mg/dL (ref >=50)
LDL Cholesterol (Calc): 84 mg/dL (calc)
Non-HDL Cholesterol (Calc): 106 mg/dL (calc) (ref <130)
Total CHOL/HDL Ratio: 2.7 (calc) (ref <5.0)
Triglycerides: 128 mg/dL (ref <150)

## 2020-06-02 LAB — CBC WITH DIFFERENTIAL/PLATELET
Absolute Monocytes: 524 cells/uL (ref 200–950)
Basophils Absolute: 48 cells/uL (ref 0–200)
Basophils Relative: 0.7 %
Eosinophils Absolute: 360 cells/uL (ref 15–500)
Eosinophils Relative: 5.3 %
HCT: 38.2 % (ref 35.0–45.0)
Hemoglobin: 12.7 g/dL (ref 11.7–15.5)
Lymphs Abs: 2441 cells/uL (ref 850–3900)
MCH: 32.1 pg (ref 27.0–33.0)
MCHC: 33.2 g/dL (ref 32.0–36.0)
MCV: 96.5 fL (ref 80.0–100.0)
MPV: 10.5 fL (ref 7.5–12.5)
Monocytes Relative: 7.7 %
Neutro Abs: 3427 cells/uL (ref 1500–7800)
Neutrophils Relative %: 50.4 %
Platelets: 300 10*3/uL (ref 140–400)
RBC: 3.96 10*6/uL (ref 3.80–5.10)
RDW: 11.9 % (ref 11.0–15.0)
Total Lymphocyte: 35.9 %
WBC: 6.8 10*3/uL (ref 3.8–10.8)

## 2020-06-02 LAB — COMPLETE METABOLIC PANEL WITH GFR
AG Ratio: 1.9 (calc) (ref 1.0–2.5)
ALT: 26 U/L (ref 6–29)
AST: 20 U/L (ref 10–35)
Albumin: 4.4 g/dL (ref 3.6–5.1)
Alkaline phosphatase (APISO): 62 U/L (ref 37–153)
BUN/Creatinine Ratio: 22 (calc) (ref 6–22)
BUN: 23 mg/dL (ref 7–25)
CO2: 25 mmol/L (ref 20–32)
Calcium: 9.7 mg/dL (ref 8.6–10.4)
Chloride: 106 mmol/L (ref 98–110)
Creat: 1.04 mg/dL — ABNORMAL HIGH (ref 0.50–0.99)
GFR, Est African American: 66 mL/min/{1.73_m2} (ref >=60)
GFR, Est Non African American: 57 mL/min/{1.73_m2} — ABNORMAL LOW (ref >=60)
Globulin: 2.3 g/dL (calc) (ref 1.9–3.7)
Glucose, Bld: 99 mg/dL (ref 65–99)
Potassium: 4.1 mmol/L (ref 3.5–5.3)
Sodium: 141 mmol/L (ref 135–146)
Total Bilirubin: 0.9 mg/dL (ref 0.2–1.2)
Total Protein: 6.7 g/dL (ref 6.1–8.1)

## 2020-06-02 LAB — TSH: TSH: 0.78 mIU/L (ref 0.40–4.50)

## 2020-06-02 LAB — VITAMIN D 25 HYDROXY (VIT D DEFICIENCY, FRACTURES): Vit D, 25-Hydroxy: 78 ng/mL (ref 30–100)

## 2020-06-02 LAB — INSULIN, RANDOM: Insulin: 16.5 u[IU]/mL

## 2020-06-02 LAB — MAGNESIUM: Magnesium: 2 mg/dL (ref 1.5–2.5)

## 2020-06-03 ENCOUNTER — Other Ambulatory Visit: Payer: Self-pay | Admitting: Internal Medicine

## 2020-06-03 DIAGNOSIS — Z79899 Other long term (current) drug therapy: Secondary | ICD-10-CM

## 2020-06-03 DIAGNOSIS — I10 Essential (primary) hypertension: Secondary | ICD-10-CM

## 2020-06-03 MED ORDER — LISINOPRIL 20 MG PO TABS: ORAL_TABLET | ORAL | 1 refills | Status: DC

## 2020-06-03 NOTE — Progress Notes (Signed)
=====================================================  -    Kidney functions (GFR) have decreased from 76 to 57 and is most  likely due to Dehydration   - Important to drink more water / Fluids- at least 6 bottles (16 oz) of  water / fluids /day to Prevent permanent Kidney damage.  Also, your BP med - Lisinopril - HCT- has a fluid pill component,   So.................Marland KitchenStop the Lisinopril / HCT &   - New Rx for plain Lisinopril 20 sent to Pharmacy  - Also , please call office to  schedule a Nurse Visit & Lab to recheck your  Kidney functions =====================================================  -  Total Chol = 170 and LDL Chol = 84 -  Excellent   - Very low risk for Heart Attack  / Stroke =====================================================  - A1c = 5.8% - Blood sugar and A1c are still elevated in the  borderline and  early or pre-diabetes range which has the same   300% increased risk for heart attack, stroke, cancer and   alzheimer- type vascular dementia as full blown diabetes.   But the good news is that diet, exercise with  weight loss can cure the early diabetes at this point. =====================================================  -  Vitamin D = 78 - Excellent  =====================================================  -  All Else - CBC - Kidneys - Electrolytes - Liver - Magnesium  & Thyroid    - all  Normal / OK ====================================================

## 2020-06-06 DIAGNOSIS — J301 Allergic rhinitis due to pollen: Secondary | ICD-10-CM | POA: Diagnosis not present

## 2020-06-09 DIAGNOSIS — J301 Allergic rhinitis due to pollen: Secondary | ICD-10-CM | POA: Diagnosis not present

## 2020-06-09 DIAGNOSIS — J3089 Other allergic rhinitis: Secondary | ICD-10-CM | POA: Diagnosis not present

## 2020-06-13 DIAGNOSIS — J301 Allergic rhinitis due to pollen: Secondary | ICD-10-CM | POA: Diagnosis not present

## 2020-06-16 DIAGNOSIS — J301 Allergic rhinitis due to pollen: Secondary | ICD-10-CM | POA: Diagnosis not present

## 2020-06-19 DIAGNOSIS — J301 Allergic rhinitis due to pollen: Secondary | ICD-10-CM | POA: Diagnosis not present

## 2020-06-21 DIAGNOSIS — J3089 Other allergic rhinitis: Secondary | ICD-10-CM | POA: Diagnosis not present

## 2020-06-21 DIAGNOSIS — J301 Allergic rhinitis due to pollen: Secondary | ICD-10-CM | POA: Diagnosis not present

## 2020-07-04 DIAGNOSIS — Z62820 Parent-biological child conflict: Secondary | ICD-10-CM | POA: Diagnosis not present

## 2020-07-04 DIAGNOSIS — F419 Anxiety disorder, unspecified: Secondary | ICD-10-CM | POA: Diagnosis not present

## 2020-07-04 DIAGNOSIS — J301 Allergic rhinitis due to pollen: Secondary | ICD-10-CM | POA: Diagnosis not present

## 2020-07-07 DIAGNOSIS — J301 Allergic rhinitis due to pollen: Secondary | ICD-10-CM | POA: Diagnosis not present

## 2020-07-11 DIAGNOSIS — F411 Generalized anxiety disorder: Secondary | ICD-10-CM | POA: Diagnosis not present

## 2020-07-11 DIAGNOSIS — J301 Allergic rhinitis due to pollen: Secondary | ICD-10-CM | POA: Diagnosis not present

## 2020-07-11 DIAGNOSIS — F3340 Major depressive disorder, recurrent, in remission, unspecified: Secondary | ICD-10-CM | POA: Diagnosis not present

## 2020-07-11 DIAGNOSIS — Z62820 Parent-biological child conflict: Secondary | ICD-10-CM | POA: Diagnosis not present

## 2020-07-11 DIAGNOSIS — F419 Anxiety disorder, unspecified: Secondary | ICD-10-CM | POA: Diagnosis not present

## 2020-07-14 DIAGNOSIS — J301 Allergic rhinitis due to pollen: Secondary | ICD-10-CM | POA: Diagnosis not present

## 2020-07-14 DIAGNOSIS — J3081 Allergic rhinitis due to animal (cat) (dog) hair and dander: Secondary | ICD-10-CM | POA: Diagnosis not present

## 2020-07-14 DIAGNOSIS — J3089 Other allergic rhinitis: Secondary | ICD-10-CM | POA: Diagnosis not present

## 2020-07-19 DIAGNOSIS — F411 Generalized anxiety disorder: Secondary | ICD-10-CM | POA: Diagnosis not present

## 2020-07-19 DIAGNOSIS — J301 Allergic rhinitis due to pollen: Secondary | ICD-10-CM | POA: Diagnosis not present

## 2020-07-19 DIAGNOSIS — F3340 Major depressive disorder, recurrent, in remission, unspecified: Secondary | ICD-10-CM | POA: Diagnosis not present

## 2020-07-19 DIAGNOSIS — Z62820 Parent-biological child conflict: Secondary | ICD-10-CM | POA: Diagnosis not present

## 2020-07-21 DIAGNOSIS — J301 Allergic rhinitis due to pollen: Secondary | ICD-10-CM | POA: Diagnosis not present

## 2020-07-25 DIAGNOSIS — Z62820 Parent-biological child conflict: Secondary | ICD-10-CM | POA: Diagnosis not present

## 2020-07-25 DIAGNOSIS — J301 Allergic rhinitis due to pollen: Secondary | ICD-10-CM | POA: Diagnosis not present

## 2020-07-25 DIAGNOSIS — F3340 Major depressive disorder, recurrent, in remission, unspecified: Secondary | ICD-10-CM | POA: Diagnosis not present

## 2020-07-25 DIAGNOSIS — F411 Generalized anxiety disorder: Secondary | ICD-10-CM | POA: Diagnosis not present

## 2020-07-26 ENCOUNTER — Other Ambulatory Visit: Payer: Self-pay | Admitting: Internal Medicine

## 2020-07-28 DIAGNOSIS — J301 Allergic rhinitis due to pollen: Secondary | ICD-10-CM | POA: Diagnosis not present

## 2020-07-28 DIAGNOSIS — J3081 Allergic rhinitis due to animal (cat) (dog) hair and dander: Secondary | ICD-10-CM | POA: Diagnosis not present

## 2020-08-01 DIAGNOSIS — Z62820 Parent-biological child conflict: Secondary | ICD-10-CM | POA: Diagnosis not present

## 2020-08-01 DIAGNOSIS — F3340 Major depressive disorder, recurrent, in remission, unspecified: Secondary | ICD-10-CM | POA: Diagnosis not present

## 2020-08-01 DIAGNOSIS — F411 Generalized anxiety disorder: Secondary | ICD-10-CM | POA: Diagnosis not present

## 2020-08-02 DIAGNOSIS — J301 Allergic rhinitis due to pollen: Secondary | ICD-10-CM | POA: Diagnosis not present

## 2020-08-08 DIAGNOSIS — Z62820 Parent-biological child conflict: Secondary | ICD-10-CM | POA: Diagnosis not present

## 2020-08-08 DIAGNOSIS — F411 Generalized anxiety disorder: Secondary | ICD-10-CM | POA: Diagnosis not present

## 2020-08-08 DIAGNOSIS — F3340 Major depressive disorder, recurrent, in remission, unspecified: Secondary | ICD-10-CM | POA: Diagnosis not present

## 2020-08-08 DIAGNOSIS — J3089 Other allergic rhinitis: Secondary | ICD-10-CM | POA: Diagnosis not present

## 2020-08-08 DIAGNOSIS — J301 Allergic rhinitis due to pollen: Secondary | ICD-10-CM | POA: Diagnosis not present

## 2020-08-10 DIAGNOSIS — F411 Generalized anxiety disorder: Secondary | ICD-10-CM | POA: Diagnosis not present

## 2020-08-10 DIAGNOSIS — F3340 Major depressive disorder, recurrent, in remission, unspecified: Secondary | ICD-10-CM | POA: Diagnosis not present

## 2020-08-10 DIAGNOSIS — Z62820 Parent-biological child conflict: Secondary | ICD-10-CM | POA: Diagnosis not present

## 2020-08-11 DIAGNOSIS — J3081 Allergic rhinitis due to animal (cat) (dog) hair and dander: Secondary | ICD-10-CM | POA: Diagnosis not present

## 2020-08-11 DIAGNOSIS — J301 Allergic rhinitis due to pollen: Secondary | ICD-10-CM | POA: Diagnosis not present

## 2020-08-11 DIAGNOSIS — J3089 Other allergic rhinitis: Secondary | ICD-10-CM | POA: Diagnosis not present

## 2020-08-12 ENCOUNTER — Other Ambulatory Visit: Payer: Self-pay | Admitting: Internal Medicine

## 2020-08-12 DIAGNOSIS — Z6839 Body mass index (BMI) 39.0-39.9, adult: Secondary | ICD-10-CM

## 2020-08-15 DIAGNOSIS — F330 Major depressive disorder, recurrent, mild: Secondary | ICD-10-CM | POA: Diagnosis not present

## 2020-08-15 DIAGNOSIS — J301 Allergic rhinitis due to pollen: Secondary | ICD-10-CM | POA: Diagnosis not present

## 2020-08-15 DIAGNOSIS — Z62820 Parent-biological child conflict: Secondary | ICD-10-CM | POA: Diagnosis not present

## 2020-08-15 DIAGNOSIS — F411 Generalized anxiety disorder: Secondary | ICD-10-CM | POA: Diagnosis not present

## 2020-08-18 DIAGNOSIS — J301 Allergic rhinitis due to pollen: Secondary | ICD-10-CM | POA: Diagnosis not present

## 2020-08-22 DIAGNOSIS — J301 Allergic rhinitis due to pollen: Secondary | ICD-10-CM | POA: Diagnosis not present

## 2020-08-22 DIAGNOSIS — F3340 Major depressive disorder, recurrent, in remission, unspecified: Secondary | ICD-10-CM | POA: Diagnosis not present

## 2020-08-22 DIAGNOSIS — Z62820 Parent-biological child conflict: Secondary | ICD-10-CM | POA: Diagnosis not present

## 2020-08-22 DIAGNOSIS — F411 Generalized anxiety disorder: Secondary | ICD-10-CM | POA: Diagnosis not present

## 2020-08-29 DIAGNOSIS — F411 Generalized anxiety disorder: Secondary | ICD-10-CM | POA: Diagnosis not present

## 2020-08-29 DIAGNOSIS — F3340 Major depressive disorder, recurrent, in remission, unspecified: Secondary | ICD-10-CM | POA: Diagnosis not present

## 2020-08-29 DIAGNOSIS — Z62820 Parent-biological child conflict: Secondary | ICD-10-CM | POA: Diagnosis not present

## 2020-09-01 DIAGNOSIS — J301 Allergic rhinitis due to pollen: Secondary | ICD-10-CM | POA: Diagnosis not present

## 2020-09-05 ENCOUNTER — Encounter: Payer: Self-pay | Admitting: Adult Health Nurse Practitioner

## 2020-09-05 ENCOUNTER — Other Ambulatory Visit: Payer: Self-pay

## 2020-09-05 ENCOUNTER — Ambulatory Visit (INDEPENDENT_AMBULATORY_CARE_PROVIDER_SITE_OTHER): Payer: BC Managed Care – PPO | Admitting: Adult Health Nurse Practitioner

## 2020-09-05 VITALS — BP 136/70 | HR 65 | Temp 97.5°F | Ht 63.0 in | Wt 206.0 lb

## 2020-09-05 DIAGNOSIS — J029 Acute pharyngitis, unspecified: Secondary | ICD-10-CM

## 2020-09-05 DIAGNOSIS — B3731 Acute candidiasis of vulva and vagina: Secondary | ICD-10-CM

## 2020-09-05 DIAGNOSIS — R7309 Other abnormal glucose: Secondary | ICD-10-CM

## 2020-09-05 DIAGNOSIS — E559 Vitamin D deficiency, unspecified: Secondary | ICD-10-CM | POA: Diagnosis not present

## 2020-09-05 DIAGNOSIS — F419 Anxiety disorder, unspecified: Secondary | ICD-10-CM

## 2020-09-05 DIAGNOSIS — F332 Major depressive disorder, recurrent severe without psychotic features: Secondary | ICD-10-CM

## 2020-09-05 DIAGNOSIS — E782 Mixed hyperlipidemia: Secondary | ICD-10-CM | POA: Diagnosis not present

## 2020-09-05 DIAGNOSIS — I10 Essential (primary) hypertension: Secondary | ICD-10-CM | POA: Diagnosis not present

## 2020-09-05 DIAGNOSIS — B373 Candidiasis of vulva and vagina: Secondary | ICD-10-CM

## 2020-09-05 DIAGNOSIS — J0140 Acute pansinusitis, unspecified: Secondary | ICD-10-CM

## 2020-09-05 MED ORDER — AMOXICILLIN 500 MG PO TABS: 500.0000 mg | ORAL_TABLET | Freq: Three times a day (TID) | ORAL | 0 refills | Status: DC

## 2020-09-05 MED ORDER — FLUCONAZOLE 150 MG PO TABS: ORAL_TABLET | ORAL | 0 refills | Status: DC

## 2020-09-05 NOTE — Progress Notes (Signed)
FOLLOW UP 3 MONTH  Assessment and Plan:   Essential hypertension Continue current medications: Lisinopril 20mg  daily Monitor blood pressure at home; call if consistently over 130/80 Continue DASH diet.   Reminder to go to the ER if any CP, SOB, nausea, dizziness, severe HA, changes vision/speech, left arm numbness and tingling and jaw pain.  Vitamin D deficiency Continue supplementation to maintain goal of 70-100 Taking Vitamin D 10,000 IU daily Defer vitamin D level   Hyperlipidemia, unspecified hyperlipidemia type Continue medications:rosuvastatin 40mg  nightly Discussed dietary and exercise modifications Low fat diet -     Lipid panel  Major depressive disorder, recurrent, severe without psychotic features ([place]) Continue medications: bupropion 150mg  daily Discussed stress management techniques  Discussed, increase water,intake & good sleep hygiene  Discussed increasing exercise & vegetables in diet   Abnormal glucose Discussed disease and risks Discussed diet/exercise, weight management  A1C q11m; defer today, monitor weight, serum glucose   Morbid obesity - BMI 38 with co morbidities Long discussion about weight loss, diet, and exercise Discussed ideal weight for height (below 140 lb) and initial weight goal (<200 lb) Patient will work on increasing exercise, portions Doing well with topamax Will follow up in 3 months   Pharyngitis, unspecified etiology Concern for duration Discussed changing antihistamine Acute pansinuitis Rx amoxicillin 500mg  BID x10days Neti pot BID, use bottled or distilled water  Stop flonase for now, use saline gel for nasal passages.  Further disposition pending results if labs check today. Discussed med's effects and SE's.   Over 30 minutes of face to face interview, exam, counseling, chart review, and critical decision making was performed.     Future Appointments  Date Time Provider [place]  01/10/2021  9:00 AM Unk Pinto, MD [facility] None     HPI 64 y.o. female  presents for 3 month follow up on hypertension, cholesterol, prediabetes, morbid obesity and vitamin D deficiency.   Reports she started having sinus symptoms that started 10days ago. She is receiving allgery injections weekly.  She is using xyzal every night and she uses flonse during the day.  She is continuously blowing her nose and reports she has sore nasal passages.         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 She is taking symbicort and xyzal.   Patient is treated by Dr Chucky May for  ADD / Depression with Fluoxetine, buproprion & Methylphenidate. Hx of steroid induced depression.  She continues with therapy for this.   BMI is Body mass index is 36.49 kg/m., she has been working on diet and exercise, is walking in small chunks as tolerated. Having some back spasms and following with ortho. She is prescribed topiramate for weight loss benefits and perceives benefit, denies SE. She is down 3 lb since last visit.  Wt Readings from Last 3 Encounters:  09/05/20 206 lb (93.4 kg)  06/01/20 (!) 209 lb 9.6 oz (95.1 kg)  03/28/20 217 lb (98.4 kg)   Her blood pressure has been controlled at home, today their BP is BP: 136/70   She does not workout. She denies chest pain, shortness of breath, dizziness.   She  is  on cholesterol medication, on crestor 40mg  and denies myalgias. Her cholesterol is at goal. The cholesterol last visit was:   Lab Results  Component Value Date   CHOL  170 06/01/2020   HDL 64 06/01/2020   LDLCALC 84 06/01/2020   TRIG 128 06/01/2020   CHOLHDL 2.7 06/01/2020    She has been working on diet and exercise for prediabetes, and denies paresthesia of the feet, polydipsia, polyuria and visual disturbances. Last A1C in the  office was:  Lab Results  Component Value Date   HGBA1C 5.8 (H) 06/01/2020   Patient is on Vitamin D supplement.   Lab Results  Component Value Date   VD25OH 78 06/01/2020       Current Medications:  Current Outpatient Medications on File Prior to Visit  Medication Sig   albuterol (VENTOLIN HFA) 108 (90 Base) MCG/ACT inhaler Inhale 2 puffs into the lungs every 4 (four) hours as needed for wheezing or shortness of breath.   Ascorbic Acid (VITAMIN C PO) Take 1 tablet by mouth.   B Complex Vitamins (VITAMIN B-COMPLEX PO) Take 1 tablet by mouth daily.   buPROPion (WELLBUTRIN XL) 150 MG 24 hr tablet Take 150 mg by mouth every morning.   Calcium Carbonate (CALCIUM 600 PO) Take by mouth daily.   cyclobenzaprine (FLEXERIL) 10 MG tablet Take 1 tablet (10 mg total) by mouth 3 (three) times daily as needed for muscle spasms.   FLUoxetine (PROZAC) 20 MG capsule Take 1 capsule (20 mg total) by mouth daily. For depression   fluticasone (FLONASE) 50 MCG/ACT nasal spray SHAKE LQ AND U 1 TO 2 SPRAYS IEN QD   levocetirizine (XYZAL) 5 MG tablet TK 1 T PO QD IN THE EVE   lisinopril (ZESTRIL) 20 MG tablet Take 1 tablet Daily for BP   lisinopril-hydrochlorothiazide (ZESTORETIC) 20-12.5 MG tablet Take 1 tablet Daily for  B   Magnesium 400 MG TABS Take 1 tablet by mouth daily.   methylphenidate (RITALIN) 10 MG tablet TK 1 T PO BID   naproxen (NAPROSYN) 500 MG tablet Take 1 tablet (500 mg total) by mouth 2 (two) times daily as needed for mild pain, moderate pain or headache (TAKE WITH MEALS.).   rosuvastatin (CRESTOR) 40 MG tablet Take     1 tablet     Daily     for Cholesterol   SYMBICORT 160-4.5 MCG/ACT inhaler INL 2 PFS PO BID   topiramate (TOPAMAX) 50 MG tablet Take 1 tablet 2 x  /day at Suppertime & Bedtime for Dieting & Weight Loss   VITAMIN D PO Take 10,000 Units by mouth daily.   No current facility-administered medications on file prior to visit.    Medical History:  Past  Medical History:  Diagnosis Date   Anxiety    Arthritis    Asthma    Cataract    Gout    Hyperlipidemia    Hypertension    Obese    Allergies:  Allergies  Allergen Reactions   Molds & Smuts Anaphylaxis   Biaxin [Clarithromycin]     GI upset   Serevent [Salmeterol]     Palpitations   Tequin [Gatifloxacin]     GI upset. thrush     Review of Systems:  Review of Systems  Constitutional: Negative.  Negative for malaise/fatigue and weight loss.  HENT: Negative.  Negative for hearing loss and tinnitus.   Eyes: Negative.  Negative for blurred vision and double vision.  Respiratory: Negative.  Negative for cough, shortness of breath and wheezing.   Cardiovascular: Negative.  Negative for chest pain, palpitations, orthopnea, claudication and leg swelling.  Gastrointestinal: Negative.  Negative for abdominal pain, blood in stool, constipation, diarrhea, heartburn,  melena, nausea and vomiting.  Genitourinary: Negative.   Musculoskeletal: Negative.  Negative for joint pain and myalgias.  Skin: Negative.  Negative for rash.  Neurological: Negative for dizziness, tingling, sensory change, weakness and headaches.  Endo/Heme/Allergies: Positive for environmental allergies. Negative for polydipsia.  Psychiatric/Behavioral: Negative for depression, substance abuse and suicidal ideas. The patient is not nervous/anxious.   All other systems reviewed and are negative.   Family history- Review and unchanged Social history- Review and unchanged Physical Exam: BP 136/70    Pulse 65    Temp (!) 97.5 F (36.4 C)    Ht 5\' 3"  (1.6 m)    Wt 206 lb (93.4 kg)    LMP 11/26/2011    SpO2 97%    BMI 36.49 kg/m  Wt Readings from Last 3 Encounters:  09/05/20 206 lb (93.4 kg)  06/01/20 (!) 209 lb 9.6 oz (95.1 kg)  03/28/20 217 lb (98.4 kg)   General Appearance: Well nourished, in no apparent distress. Eyes: PERRLA, EOMs, conjunctiva no swelling or erythema Sinuses: No Frontal/maxillary  tenderness ENT/Mouth: Ext aud canals clear, TMs without erythema, bulging.Left TM serous noted behind TM. No erythema and white exudate on post pharynx.  Tonsils erythematous. Hearing normal.  Neck: Supple, thyroid normal.  Respiratory: Respiratory effort normal, BS equal bilaterally without rales, rhonchi, wheezing or stridor.  Cardio: RRR with no MRGs. Brisk peripheral pulses without edema.  Abdomen: Soft, + BS,  Non tender, no guarding, rebound, hernias, masses. Lymphatics: Non tender without lymphadenopathy.  Musculoskeletal: Full ROM, 5/5 strength, Normal gait, right arm in cast Skin: Warm, dry without rashes, lesions, ecchymosis.  Neuro: Cranial nerves intact. Normal muscle tone, no cerebellar symptoms. Psych: Awake and oriented X 3, normal affect, Insight and Judgment appropriate.    Garnet Sierras, NP 10:30 AM [facility]

## 2020-09-05 NOTE — Patient Instructions (Addendum)
   Try changing xyzal to  cetirazine (Zyrtec).  Continue taking Allegra (Fexofenadine)  Also continue taking singulair.  STOP flonase for right now until nasal passages stop burning.  Can also try Chlorphenamine to dry [place] your sinuses.  Try saline gel to help sooth your nasal passages.  We will send in an antibiotic to your pharmacy.  Please contact office with any new or worsening symptoms.

## 2020-09-06 LAB — COMPLETE METABOLIC PANEL WITH GFR
AG Ratio: 2.3 (calc) (ref 1.0–2.5)
ALT: 23 U/L (ref 6–29)
AST: 16 U/L (ref 10–35)
Albumin: 4.6 g/dL (ref 3.6–5.1)
Alkaline phosphatase (APISO): 70 U/L (ref 37–153)
BUN: 13 mg/dL (ref 7–25)
CO2: 26 mmol/L (ref 20–32)
Calcium: 9.2 mg/dL (ref 8.6–10.4)
Chloride: 108 mmol/L (ref 98–110)
Creat: 0.79 mg/dL (ref 0.50–0.99)
GFR, Est African American: 92 mL/min/{1.73_m2} (ref >=60)
GFR, Est Non African American: 80 mL/min/{1.73_m2} (ref >=60)
Globulin: 2 g/dL (calc) (ref 1.9–3.7)
Glucose, Bld: 97 mg/dL (ref 65–99)
Potassium: 4.4 mmol/L (ref 3.5–5.3)
Sodium: 142 mmol/L (ref 135–146)
Total Bilirubin: 0.4 mg/dL (ref 0.2–1.2)
Total Protein: 6.6 g/dL (ref 6.1–8.1)

## 2020-09-06 LAB — CBC WITH DIFFERENTIAL/PLATELET
Absolute Monocytes: 500 cells/uL (ref 200–950)
Basophils Absolute: 61 cells/uL (ref 0–200)
Basophils Relative: 1 %
Eosinophils Absolute: 464 cells/uL (ref 15–500)
Eosinophils Relative: 7.6 %
HCT: 39.4 % (ref 35.0–45.0)
Hemoglobin: 13 g/dL (ref 11.7–15.5)
Lymphs Abs: 2623 cells/uL (ref 850–3900)
MCH: 31.3 pg (ref 27.0–33.0)
MCHC: 33 g/dL (ref 32.0–36.0)
MCV: 94.7 fL (ref 80.0–100.0)
MPV: 10.4 fL (ref 7.5–12.5)
Monocytes Relative: 8.2 %
Neutro Abs: 2452 cells/uL (ref 1500–7800)
Neutrophils Relative %: 40.2 %
Platelets: 305 10*3/uL (ref 140–400)
RBC: 4.16 10*6/uL (ref 3.80–5.10)
RDW: 11.9 % (ref 11.0–15.0)
Total Lymphocyte: 43 %
WBC: 6.1 10*3/uL (ref 3.8–10.8)

## 2020-09-06 LAB — LIPID PANEL
Cholesterol: 232 mg/dL — ABNORMAL HIGH (ref <200)
HDL: 66 mg/dL (ref >=50)
LDL Cholesterol (Calc): 135 mg/dL (calc) — ABNORMAL HIGH
Non-HDL Cholesterol (Calc): 166 mg/dL (calc) — ABNORMAL HIGH (ref <130)
Total CHOL/HDL Ratio: 3.5 (calc) (ref <5.0)
Triglycerides: 175 mg/dL — ABNORMAL HIGH (ref <150)

## 2020-09-07 ENCOUNTER — Other Ambulatory Visit: Payer: BC Managed Care – PPO

## 2020-09-07 ENCOUNTER — Other Ambulatory Visit: Payer: Self-pay

## 2020-09-07 DIAGNOSIS — U071 COVID-19: Secondary | ICD-10-CM | POA: Diagnosis not present

## 2020-09-08 LAB — SPECIMEN STATUS REPORT

## 2020-09-08 LAB — SARS-COV-2, NAA 2 DAY TAT

## 2020-09-08 LAB — NOVEL CORONAVIRUS, NAA: SARS-CoV-2, NAA: NOT DETECTED

## 2020-09-11 ENCOUNTER — Ambulatory Visit (INDEPENDENT_AMBULATORY_CARE_PROVIDER_SITE_OTHER): Payer: BC Managed Care – PPO | Admitting: Podiatry

## 2020-09-11 ENCOUNTER — Ambulatory Visit (INDEPENDENT_AMBULATORY_CARE_PROVIDER_SITE_OTHER): Payer: BC Managed Care – PPO

## 2020-09-11 ENCOUNTER — Other Ambulatory Visit: Payer: Self-pay

## 2020-09-11 DIAGNOSIS — M1000 Idiopathic gout, unspecified site: Secondary | ICD-10-CM

## 2020-09-11 DIAGNOSIS — S96919A Strain of unspecified muscle and tendon at ankle and foot level, unspecified foot, initial encounter: Secondary | ICD-10-CM | POA: Diagnosis not present

## 2020-09-11 DIAGNOSIS — M79672 Pain in left foot: Secondary | ICD-10-CM

## 2020-09-11 DIAGNOSIS — M76822 Posterior tibial tendinitis, left leg: Secondary | ICD-10-CM | POA: Diagnosis not present

## 2020-09-11 DIAGNOSIS — M76829 Posterior tibial tendinitis, unspecified leg: Secondary | ICD-10-CM

## 2020-09-11 MED ORDER — TRAMADOL HCL 50 MG PO TABS
50.0000 mg | ORAL_TABLET | Freq: Two times a day (BID) | ORAL | 0 refills | Status: AC | PRN
Start: 2020-09-11 — End: 2020-09-16

## 2020-09-11 MED ORDER — MELOXICAM 7.5 MG PO TABS: 7.5000 mg | ORAL_TABLET | Freq: Every day | ORAL | 0 refills | Status: DC

## 2020-09-13 ENCOUNTER — Encounter: Payer: Self-pay | Admitting: Podiatry

## 2020-09-13 NOTE — Progress Notes (Signed)
Subjective:   Patient ID: Haley White, female   DOB: 64 y.o.   MRN: [id number]   HPI 64 year old female presents the office with concerns of left ankle pain.  She states that she has been having pain for the last 5 days to the area.  She is a longstanding history upon review of the chart of chronic ankle, foot pain and she is also history of gout, pseudogout.  She states that in the last 5 days she said sharp aching, burning pain pointing medial aspect of her ankle, foot.  She has been using heating pad as well as anti-inflammatories without significant improvement.  She has no other concerns today.  She is asking for something for pain.   Review of Systems  All other systems reviewed and are negative.  Past Medical History:  Diagnosis Date   Anxiety    Arthritis    Asthma    Cataract    Gout    Hyperlipidemia    Hypertension    Obese     Past Surgical History:  Procedure Laterality Date   APPENDECTOMY  1984   CATARACT EXTRACTION W/ INTRAOCULAR LENS  IMPLANT, BILATERAL  2014   CESAREAN SECTION  1984, 1987, 1994   X3   COLONOSCOPY  11/16/2013   w/Brodie    FOOT SURGERY Left 2002   KNEE SURGERY Left 1998   ROTATOR CUFF REPAIR Right 2007   TUBAL LIGATION  1994     Current Outpatient Medications:    albuterol (VENTOLIN HFA) 108 (90 Base) MCG/ACT inhaler, Inhale 2 puffs into the lungs every 4 (four) hours as needed for wheezing or shortness of breath., Disp: 1 Inhaler, Rfl: 0   amoxicillin (AMOXIL) 500 MG tablet, Take 1 tablet (500 mg total) by mouth 3 (three) times daily. 10 days, Disp: 30 tablet, Rfl: 0   Ascorbic Acid (VITAMIN C PO), Take 1 tablet by mouth., Disp: , Rfl:    B Complex Vitamins (VITAMIN B-COMPLEX PO), Take 1 tablet by mouth daily., Disp: , Rfl:    buPROPion (WELLBUTRIN XL) 150 MG 24 hr tablet, Take 150 mg by mouth every morning., Disp: , Rfl:    Calcium Carbonate (CALCIUM 600 PO), Take by mouth daily., Disp: , Rfl:    cyclobenzaprine  (FLEXERIL) 10 MG tablet, Take 1 tablet (10 mg total) by mouth 3 (three) times daily as needed for muscle spasms., Disp: 15 tablet, Rfl: 0   fluconazole (DIFLUCAN) 150 MG tablet, Take one tablet at onset of symptoms and second tablet three days later for yeast., Disp: 2 tablet, Rfl: 0   FLUoxetine (PROZAC) 20 MG capsule, Take 1 capsule (20 mg total) by mouth daily. For depression, Disp: 5 capsule, Rfl: 0   fluticasone (FLONASE) 50 MCG/ACT nasal spray, SHAKE LQ AND U 1 TO 2 SPRAYS IEN QD, Disp: , Rfl: 3   levocetirizine (XYZAL) 5 MG tablet, TK 1 T PO QD IN THE EVE, Disp: , Rfl: 3   lisinopril (ZESTRIL) 20 MG tablet, Take 1 tablet Daily for BP, Disp: 90 tablet, Rfl: 1   Magnesium 400 MG TABS, Take 1 tablet by mouth daily., Disp: , Rfl:    meloxicam (MOBIC) 7.5 MG tablet, Take 1 tablet (7.5 mg total) by mouth daily., Disp: 14 tablet, Rfl: 0   methylphenidate (RITALIN) 10 MG tablet, TK 1 T PO BID, Disp: , Rfl: 0   naproxen (NAPROSYN) 500 MG tablet, Take 1 tablet (500 mg total) by mouth 2 (two) times daily as needed for mild  pain, moderate pain or headache (TAKE WITH MEALS.)., Disp: 20 tablet, Rfl: 0   rosuvastatin (CRESTOR) 40 MG tablet, Take     1 tablet     Daily     for Cholesterol, Disp: 90 tablet, Rfl: 0   SYMBICORT 160-4.5 MCG/ACT inhaler, INL 2 PFS PO BID, Disp: , Rfl: 6   topiramate (TOPAMAX) 50 MG tablet, Take 1 tablet 2 x  /day at Suppertime & Bedtime for Dieting & Weight Loss, Disp: 180 tablet, Rfl: 0   traMADol (ULTRAM) 50 MG tablet, Take 1 tablet (50 mg total) by mouth every 12 (twelve) hours as needed for up to 5 days., Disp: 10 tablet, Rfl: 0   VITAMIN D PO, Take 10,000 Units by mouth daily., Disp: , Rfl:   Allergies  Allergen Reactions   Molds & Smuts Anaphylaxis   Biaxin [Clarithromycin]     GI upset   Serevent [Salmeterol]     Palpitations   Tequin [Gatifloxacin]     GI upset. thrush        Objective:  Physical Exam  General: AAO x3, NAD-using crutches  due to discomfort, pain to left ankle.  Dermatological: Skin is warm, dry and supple bilateral.  There are no open sores, no preulcerative lesions, no rash or signs of infection present.  There is no areas of erythema or warmth of the foot.  Vascular: Dorsalis Pedis artery and Posterior Tibial artery pedal pulses are 2/4 bilateral with immedate capillary fill time. There is no pain with calf compression, swelling, warmth, erythema.   Neruologic: Grossly intact via light touch bilateral.  Sensation intact with Semmes Weinstein monofilament.  Musculoskeletal: There is tenderness along the posterior tibial tendon, flexor tendon versus localized edema to the area.  There is no erythema or warmth of the area.  No area of pinpoint tenderness.  Muscular strength 5/5 in all groups tested bilateral.  Gait: Unassisted, Nonantalgic.       Assessment:   64 year old female with history of gout with likely left side posterior tibial tendinitis    Plan:  -Treatment options discussed including all alternatives, risks, and complications -Etiology of symptoms were discussed -X-rays were obtained and reviewed with the patient.  No evidence of acute fracture identified today. -Although she has a history of gout this could be somewhat Of gout symptoms but more concerned about flexor tendinitis on the medial ankle on the left side.  Today recommend immobilization in a Tri-Lock ankle brace was dispensed.  I prescribed meloxicam to take as needed.  Tramadol as needed.  I have ordered an MRI of the left ankle to rule out posterior tibial, flexor tendon tear and to see if there is any degenerative changes within the tendon which could be result of callus as well given her history.  Trula Slade DPM

## 2020-09-14 DIAGNOSIS — J301 Allergic rhinitis due to pollen: Secondary | ICD-10-CM | POA: Diagnosis not present

## 2020-09-19 DIAGNOSIS — F411 Generalized anxiety disorder: Secondary | ICD-10-CM | POA: Diagnosis not present

## 2020-09-19 DIAGNOSIS — F3340 Major depressive disorder, recurrent, in remission, unspecified: Secondary | ICD-10-CM | POA: Diagnosis not present

## 2020-09-19 DIAGNOSIS — Z62820 Parent-biological child conflict: Secondary | ICD-10-CM | POA: Diagnosis not present

## 2020-09-20 ENCOUNTER — Telehealth: Payer: Self-pay

## 2020-09-20 ENCOUNTER — Other Ambulatory Visit: Payer: Self-pay | Admitting: Podiatry

## 2020-09-20 NOTE — Telephone Encounter (Signed)
Pt was seen for pseudogout and would like a refill on pain medication. The pt is schedule for MRI on Monday 09/25/20.

## 2020-09-21 ENCOUNTER — Other Ambulatory Visit: Payer: Self-pay | Admitting: Podiatry

## 2020-09-21 DIAGNOSIS — J301 Allergic rhinitis due to pollen: Secondary | ICD-10-CM | POA: Diagnosis not present

## 2020-09-21 MED ORDER — TRAMADOL HCL 50 MG PO TABS
50.0000 mg | ORAL_TABLET | Freq: Two times a day (BID) | ORAL | 0 refills | Status: AC | PRN
Start: 2020-09-21 — End: 2020-09-26

## 2020-09-21 NOTE — Telephone Encounter (Signed)
Sent!

## 2020-09-25 ENCOUNTER — Ambulatory Visit
Admission: RE | Admit: 2020-09-25 | Discharge: 2020-09-25 | Disposition: A | Discharge status: Home / Self Care | Payer: BC Managed Care – PPO | Source: Ambulatory Visit | Attending: Podiatry | Admitting: Podiatry

## 2020-09-25 DIAGNOSIS — M7989 Other specified soft tissue disorders: Secondary | ICD-10-CM | POA: Diagnosis not present

## 2020-09-25 DIAGNOSIS — S86012A Strain of left Achilles tendon, initial encounter: Secondary | ICD-10-CM | POA: Diagnosis not present

## 2020-09-25 DIAGNOSIS — S86312A Strain of muscle(s) and tendon(s) of peroneal muscle group at lower leg level, left leg, initial encounter: Secondary | ICD-10-CM | POA: Diagnosis not present

## 2020-09-25 DIAGNOSIS — M19072 Primary osteoarthritis, left ankle and foot: Secondary | ICD-10-CM | POA: Diagnosis not present

## 2020-09-25 DIAGNOSIS — S96919A Strain of unspecified muscle and tendon at ankle and foot level, unspecified foot, initial encounter: Secondary | ICD-10-CM

## 2020-09-25 IMAGING — MR MR ANKLE*L* W/O CM
4 of 5 series · 13 of 40 positions shown · non-contrast
Comparison: Radiographs [DATE]

CLINICAL DATA: Medial ankle pain and swelling.

EXAM:
MRI OF THE LEFT ANKLE WITHOUT CONTRAST
TECHNIQUE: Multiplanar, multisequence MR imaging of the ankle was performed. No
intravenous contrast was administered.

[Series 3: PD fat-sat · axial · left · 3.0mm · 0.25mm/px · z∈[-82,+54]mm · 4 of 40 slices shown]
[im 1/40]
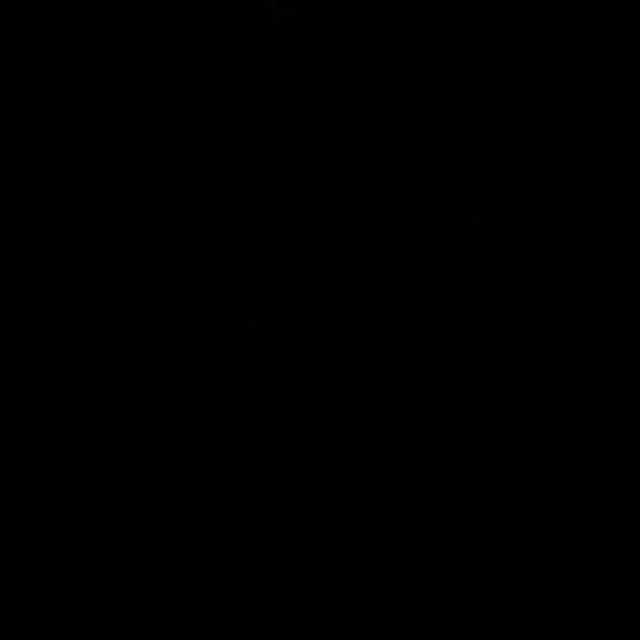
[im 5/40]
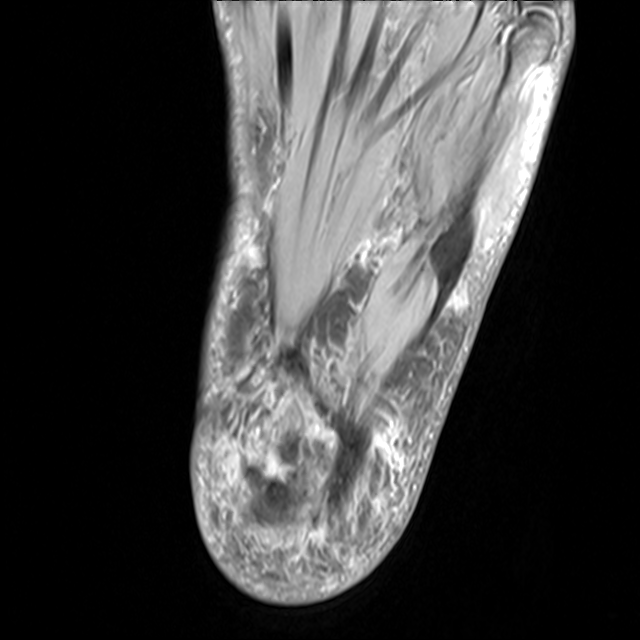
[im 22/40]
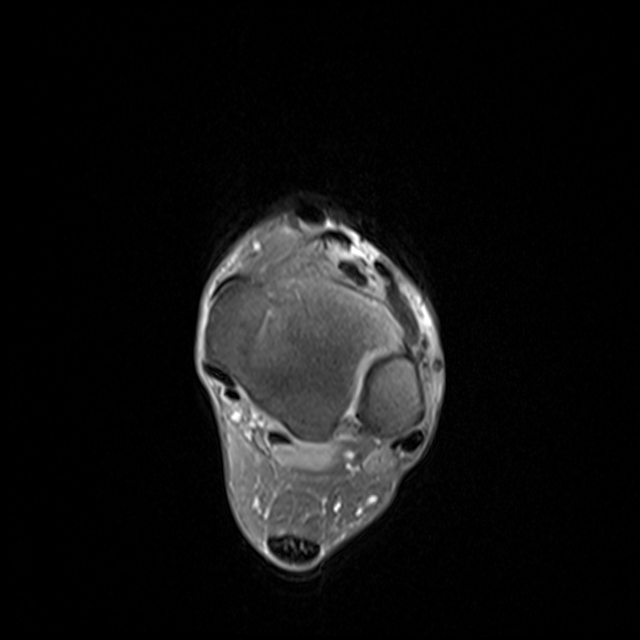
[im 35/40]
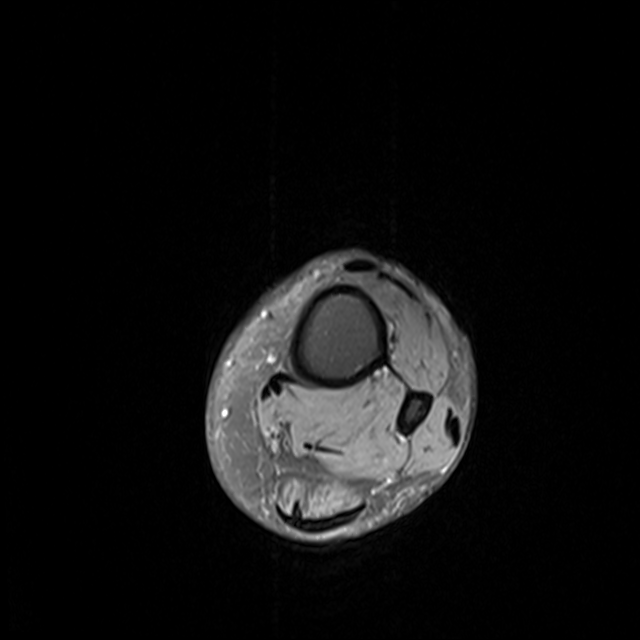

[Series 4: T2 fat-sat · axial · left · 3.0mm · 0.25mm/px · z∈[-66,+54]mm · 3 of 40 slices shown (1 of 2)]
[im 5/40]
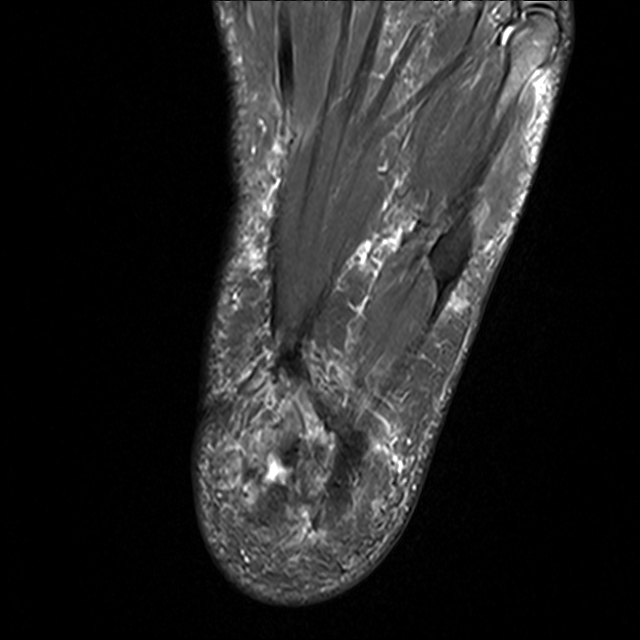
[im 22/40]
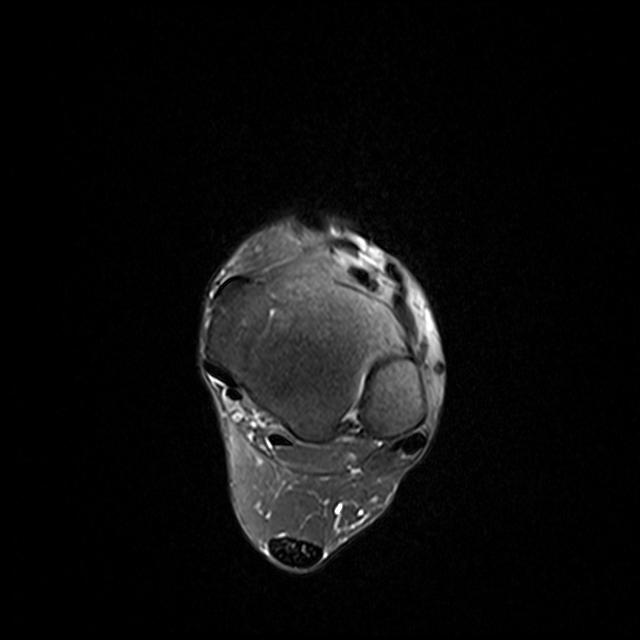
[im 35/40]
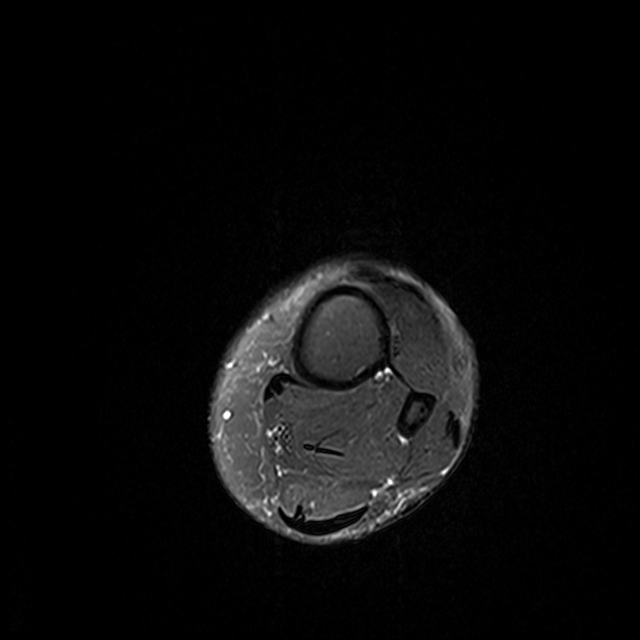

[Series 5: T1 · sagittal · left · 4.0mm · 0.27mm/px · 3 of 19 slices shown]
[im 1/19]
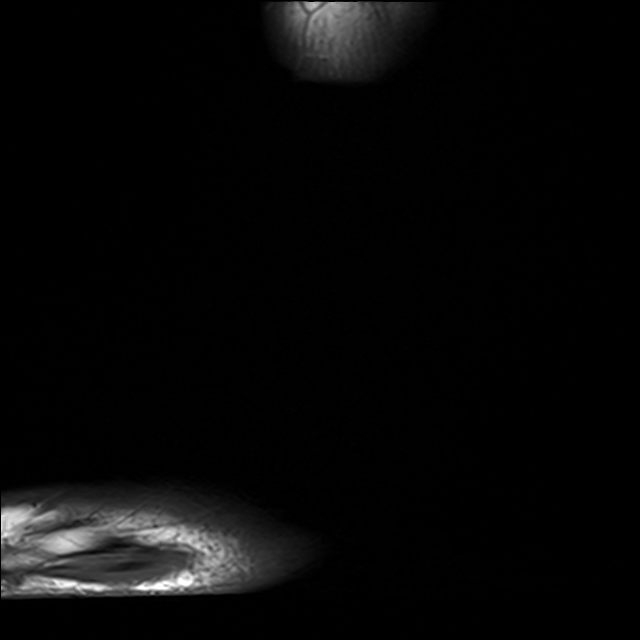
[im 10/19]
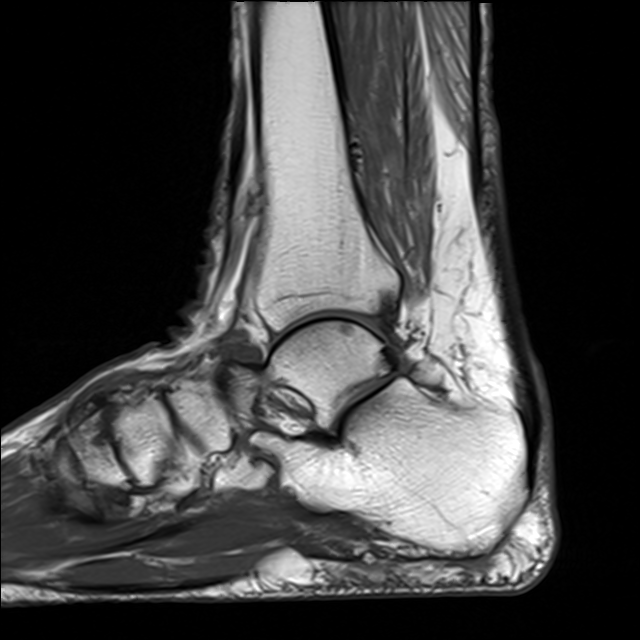
[im 19/19]
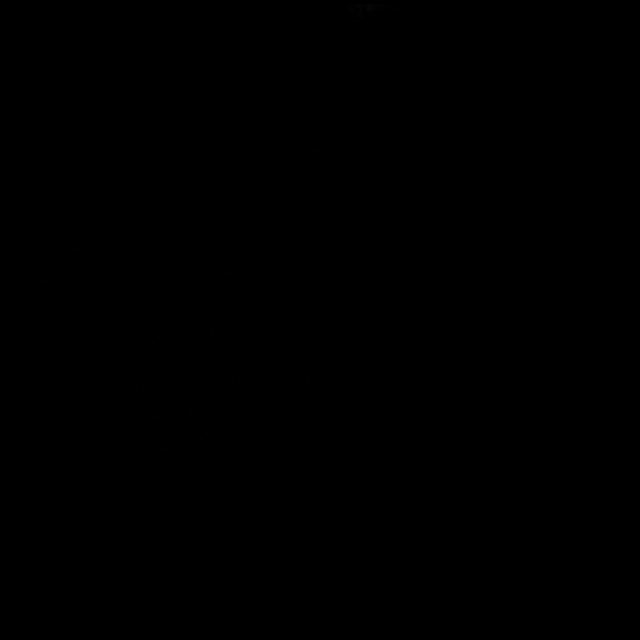

[Series 7: T2 fat-sat · coronal · left · 3.0mm · 0.25mm/px · 3 of 40 slices shown (2 of 2)]
[im 5/40]
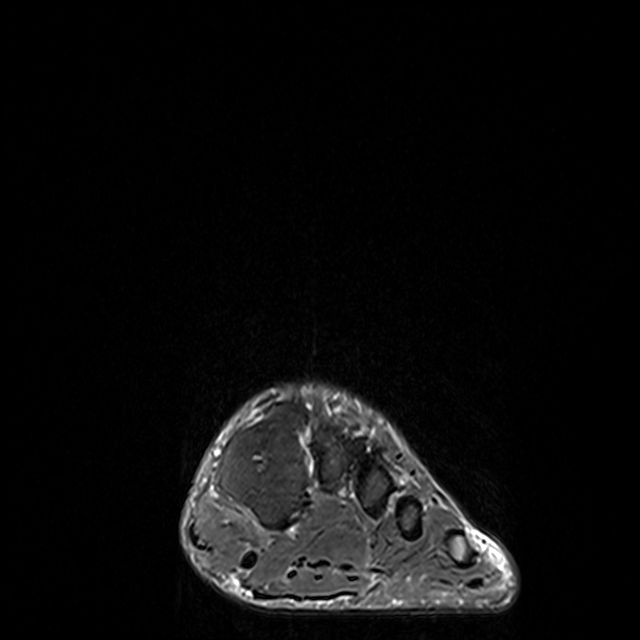
[im 22/40]
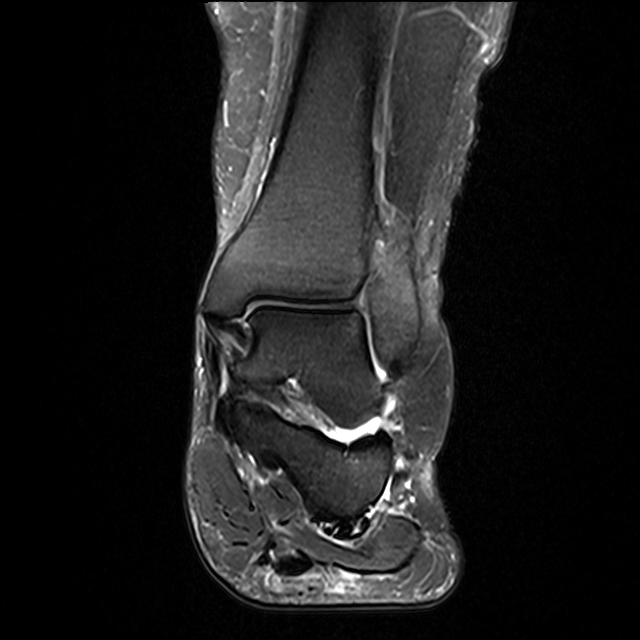
[im 35/40]
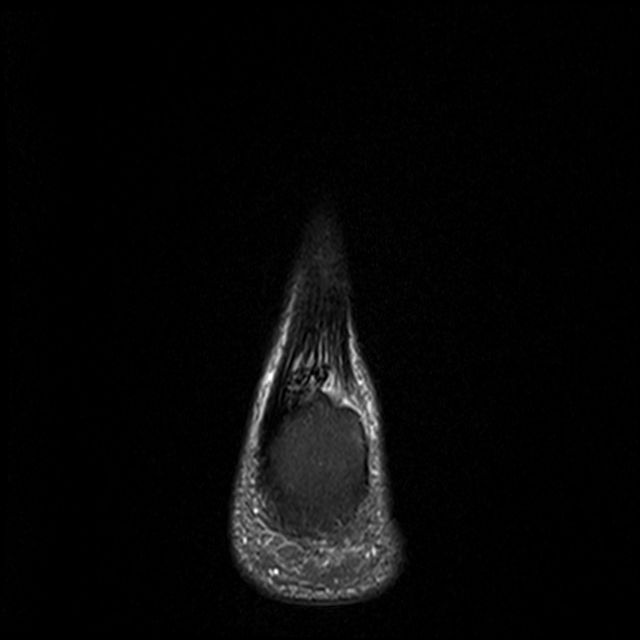

[13 of 40 positions shown; findings below may reference images not displayed]

FINDINGS: TENDONS

Peroneal: The tendons are intact. There is moderate tendinopathy and
a longitudinal split type tear involving the peroneus brevis tendon.
Mild tenosynovitis involving both tendons.

Posteromedial: Distal tendinopathy involving the attachment fibers
of the posterior tibialis tendon. The MORRE and FHL tendons are intact

Anterior: Intact.  No significant tendinopathy or tenosynovitis.

Achilles: Chronic Achilles tendinopathy with thickening and small
interstitial tears distally. No full-thickness tear.

Plantar Fascia: Suspect prior surgical changes or possibly prior
tear of the plantar fascia. The central band has a defect and
appears somewhat thickened and irregular. I suspect there are
surgical changes with some areas of artifact on the MRI. There are
also calcifications in this area noted on the plain films. No acute
tear/rupture.

LIGAMENTS

Lateral: Intact

Medial: Intact

CARTILAGE

Ankle Joint: Moderate degenerative changes. No joint effusion or
osteochondral lesion.

Subtalar Joints/Sinus Tarsi: Mild degenerative changes. The sinus
tarsi is unremarkable. The cervical and interosseous ligaments are
intact. The spring ligament is intact.

Bones: Moderate midfoot degenerative changes are noted. No stress
fracture or AVN.

Other: Unremarkable foot musculature.
IMPRESSION: 1. Moderate tendinopathy and a longitudinal split type tear
involving the peroneus brevis tendon. Mild tenosynovitis involving
both peroneal tendons.
2. Distal tendinopathy involving the attachment fibers of the
posterior tibialis tendon.
3. Chronic appearing Achilles tendinopathy.
4. Suspect prior surgical changes or possibly prior tear of the
plantar fascia. No acute tear/rupture.
5. Moderate midfoot degenerative changes. No stress fracture or AVN.

## 2020-09-26 DIAGNOSIS — F411 Generalized anxiety disorder: Secondary | ICD-10-CM | POA: Diagnosis not present

## 2020-09-26 DIAGNOSIS — Z62820 Parent-biological child conflict: Secondary | ICD-10-CM | POA: Diagnosis not present

## 2020-09-26 DIAGNOSIS — J301 Allergic rhinitis due to pollen: Secondary | ICD-10-CM | POA: Diagnosis not present

## 2020-09-26 DIAGNOSIS — F3340 Major depressive disorder, recurrent, in remission, unspecified: Secondary | ICD-10-CM | POA: Diagnosis not present

## 2020-10-03 ENCOUNTER — Other Ambulatory Visit: Payer: Self-pay

## 2020-10-03 ENCOUNTER — Ambulatory Visit (INDEPENDENT_AMBULATORY_CARE_PROVIDER_SITE_OTHER): Payer: BC Managed Care – PPO | Admitting: Podiatry

## 2020-10-03 DIAGNOSIS — M779 Enthesopathy, unspecified: Secondary | ICD-10-CM

## 2020-10-03 DIAGNOSIS — M79672 Pain in left foot: Secondary | ICD-10-CM

## 2020-10-03 DIAGNOSIS — M76829 Posterior tibial tendinitis, unspecified leg: Secondary | ICD-10-CM

## 2020-10-03 DIAGNOSIS — M19079 Primary osteoarthritis, unspecified ankle and foot: Secondary | ICD-10-CM | POA: Diagnosis not present

## 2020-10-03 NOTE — Patient Instructions (Signed)
I have ordered physical therapy at [facility]. If you do not hear for them about scheduling within the next 1 week, or you have any questions please give [place] a call at [phone].

## 2020-10-03 NOTE — Progress Notes (Signed)
Subjective: 64 year old female presents the office for follow-up evaluation of left ankle pain and to further discuss the MRI results.  She states that she is been in the cam boot but she is continued her discomfort.  She does take the pain medications intermittently.  She is on naproxen daily for other issues.  No recent or falls or changes since I last saw her.  She does state today that the pain is been intermittent and the more she is on her foot when she has more discomfort pointing the medial aspect ankle.  After further discussion with her I spoke other pains to her foot and she does describe pain to the lateral aspect of her foot or ankle as well. Denies any systemic complaints such as fevers, chills, nausea, vomiting. No acute changes since last appointment, and no other complaints at this time.   Objective: AAO x3, NAD DP/PT pulses palpable bilaterally, CRT less than 3 seconds There is decreased medial arch height.  Ankle, subtalar joint range of motion intact.  Majority of tenderness is still on the distal portion of the posterior tibial tendon with minimal edema.  There is minimal discomfort on the course the peroneal tendons today.  Flexor, extensor tendons appear to be intact.  MMT 5/5. No pain with calf compression, swelling, warmth, erythema  Assessment: 64 year old female with posterior tibial tendinitis, partial tear of peroneal tendon, flatfoot with arthritis  Plan: -All treatment options discussed with the patient including all alternatives, risks, complications.  -I reviewed the MRI results with her today.  After discussion I recommended to continue the boot for now but she can start physical therapy as she is progressing transition to regular shoe.  [facility]cription for [facility] written today.  She was measured for custom orthotics today.  Continue naproxen.  Ice the area daily.  We discussed surgical intervention other treatment options including EPAT.  She dementia  brought up gout and pseudogout I do not think that this is causing her symptoms at this point. -Patient encouraged to call the office with any questions, concerns, change in symptoms.   Trula Slade DPM

## 2020-10-05 DIAGNOSIS — R531 Weakness: Secondary | ICD-10-CM | POA: Diagnosis not present

## 2020-10-05 DIAGNOSIS — M25572 Pain in left ankle and joints of left foot: Secondary | ICD-10-CM | POA: Diagnosis not present

## 2020-10-05 DIAGNOSIS — R2689 Other abnormalities of gait and mobility: Secondary | ICD-10-CM | POA: Diagnosis not present

## 2020-10-06 ENCOUNTER — Other Ambulatory Visit: Payer: Self-pay | Admitting: Internal Medicine

## 2020-10-06 MED ORDER — ONDANSETRON HCL 8 MG PO TABS: ORAL_TABLET | ORAL | 0 refills | Status: DC

## 2020-10-06 MED ORDER — DEXAMETHASONE 4 MG PO TABS: ORAL_TABLET | ORAL | 0 refills | Status: DC

## 2020-10-09 DIAGNOSIS — R531 Weakness: Secondary | ICD-10-CM | POA: Diagnosis not present

## 2020-10-09 DIAGNOSIS — M25572 Pain in left ankle and joints of left foot: Secondary | ICD-10-CM | POA: Diagnosis not present

## 2020-10-09 DIAGNOSIS — R2689 Other abnormalities of gait and mobility: Secondary | ICD-10-CM | POA: Diagnosis not present

## 2020-10-10 DIAGNOSIS — F411 Generalized anxiety disorder: Secondary | ICD-10-CM | POA: Diagnosis not present

## 2020-10-10 DIAGNOSIS — F3340 Major depressive disorder, recurrent, in remission, unspecified: Secondary | ICD-10-CM | POA: Diagnosis not present

## 2020-10-10 DIAGNOSIS — J301 Allergic rhinitis due to pollen: Secondary | ICD-10-CM | POA: Diagnosis not present

## 2020-10-10 DIAGNOSIS — Z62820 Parent-biological child conflict: Secondary | ICD-10-CM | POA: Diagnosis not present

## 2020-10-11 DIAGNOSIS — R2689 Other abnormalities of gait and mobility: Secondary | ICD-10-CM | POA: Diagnosis not present

## 2020-10-11 DIAGNOSIS — M25572 Pain in left ankle and joints of left foot: Secondary | ICD-10-CM | POA: Diagnosis not present

## 2020-10-11 DIAGNOSIS — R531 Weakness: Secondary | ICD-10-CM | POA: Diagnosis not present

## 2020-10-13 DIAGNOSIS — M25572 Pain in left ankle and joints of left foot: Secondary | ICD-10-CM | POA: Diagnosis not present

## 2020-10-13 DIAGNOSIS — R2689 Other abnormalities of gait and mobility: Secondary | ICD-10-CM | POA: Diagnosis not present

## 2020-10-13 DIAGNOSIS — R531 Weakness: Secondary | ICD-10-CM | POA: Diagnosis not present

## 2020-10-16 DIAGNOSIS — J301 Allergic rhinitis due to pollen: Secondary | ICD-10-CM | POA: Diagnosis not present

## 2020-10-16 DIAGNOSIS — R531 Weakness: Secondary | ICD-10-CM | POA: Diagnosis not present

## 2020-10-16 DIAGNOSIS — R2689 Other abnormalities of gait and mobility: Secondary | ICD-10-CM | POA: Diagnosis not present

## 2020-10-16 DIAGNOSIS — M25572 Pain in left ankle and joints of left foot: Secondary | ICD-10-CM | POA: Diagnosis not present

## 2020-10-17 DIAGNOSIS — F411 Generalized anxiety disorder: Secondary | ICD-10-CM | POA: Diagnosis not present

## 2020-10-17 DIAGNOSIS — Z62820 Parent-biological child conflict: Secondary | ICD-10-CM | POA: Diagnosis not present

## 2020-10-17 DIAGNOSIS — F3340 Major depressive disorder, recurrent, in remission, unspecified: Secondary | ICD-10-CM | POA: Diagnosis not present

## 2020-10-18 DIAGNOSIS — R2689 Other abnormalities of gait and mobility: Secondary | ICD-10-CM | POA: Diagnosis not present

## 2020-10-18 DIAGNOSIS — M25572 Pain in left ankle and joints of left foot: Secondary | ICD-10-CM | POA: Diagnosis not present

## 2020-10-18 DIAGNOSIS — R531 Weakness: Secondary | ICD-10-CM | POA: Diagnosis not present

## 2020-10-19 DIAGNOSIS — J301 Allergic rhinitis due to pollen: Secondary | ICD-10-CM | POA: Diagnosis not present

## 2020-10-20 DIAGNOSIS — M25572 Pain in left ankle and joints of left foot: Secondary | ICD-10-CM | POA: Diagnosis not present

## 2020-10-20 DIAGNOSIS — R2689 Other abnormalities of gait and mobility: Secondary | ICD-10-CM | POA: Diagnosis not present

## 2020-10-20 DIAGNOSIS — R531 Weakness: Secondary | ICD-10-CM | POA: Diagnosis not present

## 2020-10-23 DIAGNOSIS — M25572 Pain in left ankle and joints of left foot: Secondary | ICD-10-CM | POA: Diagnosis not present

## 2020-10-23 DIAGNOSIS — R531 Weakness: Secondary | ICD-10-CM | POA: Diagnosis not present

## 2020-10-23 DIAGNOSIS — R2689 Other abnormalities of gait and mobility: Secondary | ICD-10-CM | POA: Diagnosis not present

## 2020-10-24 ENCOUNTER — Other Ambulatory Visit: Payer: Self-pay | Admitting: Internal Medicine

## 2020-10-24 DIAGNOSIS — F3340 Major depressive disorder, recurrent, in remission, unspecified: Secondary | ICD-10-CM | POA: Diagnosis not present

## 2020-10-24 DIAGNOSIS — Z62820 Parent-biological child conflict: Secondary | ICD-10-CM | POA: Diagnosis not present

## 2020-10-24 DIAGNOSIS — L814 Other melanin hyperpigmentation: Secondary | ICD-10-CM | POA: Diagnosis not present

## 2020-10-24 DIAGNOSIS — D1801 Hemangioma of skin and subcutaneous tissue: Secondary | ICD-10-CM | POA: Diagnosis not present

## 2020-10-24 DIAGNOSIS — F411 Generalized anxiety disorder: Secondary | ICD-10-CM | POA: Diagnosis not present

## 2020-10-24 DIAGNOSIS — Z03818 Encounter for observation for suspected exposure to other biological agents ruled out: Secondary | ICD-10-CM | POA: Diagnosis not present

## 2020-10-24 DIAGNOSIS — L821 Other seborrheic keratosis: Secondary | ICD-10-CM | POA: Diagnosis not present

## 2020-10-24 DIAGNOSIS — L819 Disorder of pigmentation, unspecified: Secondary | ICD-10-CM | POA: Diagnosis not present

## 2020-10-25 DIAGNOSIS — R2689 Other abnormalities of gait and mobility: Secondary | ICD-10-CM | POA: Diagnosis not present

## 2020-10-25 DIAGNOSIS — R531 Weakness: Secondary | ICD-10-CM | POA: Diagnosis not present

## 2020-10-25 DIAGNOSIS — M25572 Pain in left ankle and joints of left foot: Secondary | ICD-10-CM | POA: Diagnosis not present

## 2020-10-31 ENCOUNTER — Ambulatory Visit: Payer: BC Managed Care – PPO | Admitting: Orthotics

## 2020-10-31 ENCOUNTER — Other Ambulatory Visit: Payer: Self-pay

## 2020-10-31 DIAGNOSIS — M76829 Posterior tibial tendinitis, unspecified leg: Secondary | ICD-10-CM

## 2020-10-31 DIAGNOSIS — M19079 Primary osteoarthritis, unspecified ankle and foot: Secondary | ICD-10-CM

## 2020-10-31 NOTE — Progress Notes (Signed)
Patient picked up f/o and was pleased with fit, comfort, and function.  Worked well with footwear.  Told of rbeak in period and how to report any issues.  

## 2020-11-14 ENCOUNTER — Other Ambulatory Visit: Payer: Self-pay

## 2020-11-14 ENCOUNTER — Ambulatory Visit (INDEPENDENT_AMBULATORY_CARE_PROVIDER_SITE_OTHER): Payer: 59 | Admitting: Podiatry

## 2020-11-14 DIAGNOSIS — M76829 Posterior tibial tendinitis, unspecified leg: Secondary | ICD-10-CM | POA: Diagnosis not present

## 2020-11-14 DIAGNOSIS — M779 Enthesopathy, unspecified: Secondary | ICD-10-CM | POA: Diagnosis not present

## 2020-11-14 DIAGNOSIS — M722 Plantar fascial fibromatosis: Secondary | ICD-10-CM

## 2020-11-14 MED ORDER — TRIAMCINOLONE ACETONIDE 10 MG/ML IJ SUSP
10.0000 mg | Freq: Once | INTRAMUSCULAR | Status: AC
  Administered 2020-11-14: 10 mg

## 2020-11-14 NOTE — Patient Instructions (Signed)

## 2020-11-19 NOTE — Progress Notes (Signed)
Subjective: 65 year old female presents the office today for follow-up evaluation of foot pain.  She has been doing physical therapy helping somewhat.  She also tried orthotics for about 2 weeks and she states they were not helpful.  She still gets pain she points mostly to the arch of the foot where she gets majority discomfort at this time.  Denies any recent injuries in the last saw her no increase in swelling and no redness. Denies any systemic complaints such as fevers, chills, nausea, vomiting. No acute changes since last appointment, and no other complaints at this time.   Objective: AAO x3, NAD DP/PT pulses palpable bilaterally, CRT less than 3 seconds There is no significant disc comfort along the course or insertion of the peroneal tendons, posterior tibial tendons of flexor, extensor tendons.  The majority tenderness today is on the plantar medial tubercle of the calcaneus and insertion of plantar fascia as well as along the arch of the foot on the plantar fascia.  Decreased medial arch height.  No pain to the Achilles tendon.  MMT 5/5. No pain with calf compression, swelling, warmth, erythema  Assessment: Plantar fasciitis left foot  Plan: -All treatment options discussed with the patient including all alternatives, risks, complications.  -There is no discomfort over the course of the flexor tendons with partial tearing.  Majority tenderness is on the plantar fascia.  Steroid injection was performed today.  She has been continue cam boot for now but also dispensed a plantar fascial brace that she can try to start to wear regular shoe.  Due to insurance issues will be to switch physical therapy to [facility] as her insurance has changed.   Procedure: Injection Tendon/Ligament Discussed alternatives, risks, complications and verbal consent was obtained.  Location: LEFT plantar fascia at the glabrous junction; medial approach. Skin Prep: Alcohol  Injectate: 0.5cc 0.5% marcaine plain, 0.5 cc 2%  lidocaine plain and, 1 cc kenalog 10. Disposition: Patient tolerated procedure well. Injection site dressed with a band-aid.  Post-injection care was discussed and return precautions discussed.   Return in about 3 weeks (around 12/05/2020).  Trula Slade DPM

## 2020-11-23 ENCOUNTER — Other Ambulatory Visit: Payer: Self-pay | Admitting: Internal Medicine

## 2020-11-23 DIAGNOSIS — I10 Essential (primary) hypertension: Secondary | ICD-10-CM

## 2020-11-24 ENCOUNTER — Other Ambulatory Visit: Payer: Self-pay

## 2020-11-24 ENCOUNTER — Ambulatory Visit: Payer: 59 | Attending: Podiatry

## 2020-11-24 DIAGNOSIS — M79672 Pain in left foot: Secondary | ICD-10-CM | POA: Diagnosis present

## 2020-11-24 DIAGNOSIS — M25672 Stiffness of left ankle, not elsewhere classified: Secondary | ICD-10-CM | POA: Diagnosis present

## 2020-11-24 DIAGNOSIS — M6281 Muscle weakness (generalized): Secondary | ICD-10-CM | POA: Diagnosis present

## 2020-11-24 DIAGNOSIS — R262 Difficulty in walking, not elsewhere classified: Secondary | ICD-10-CM | POA: Diagnosis present

## 2020-11-24 NOTE — Therapy (Signed)
[place], [place], [address] Phone: [phone]   Fax:  [phone]  Physical Therapy Evaluation  Patient Details  Name: Haley White MRN: [id number] Date of Birth: [birth year] Referring Provider (PT): Trula Slade, [place]   Encounter Date: 11/24/2020   PT End of Session - 11/24/20 0948    Visit Number 1    Number of Visits 17    Date for PT Re-Evaluation 01/20/21    Authorization Type [employer]: 30 VL    PT Start Time 0930    PT Stop Time 1013    PT Time Calculation (min) 43 min    Activity Tolerance Patient tolerated treatment well    Behavior During Therapy [facility] for tasks assessed/performed           Past Medical History:  Diagnosis Date  . Anxiety   . Arthritis   . Asthma   . Cataract   . Gout   . Hyperlipidemia   . Hypertension   . Obese     Past Surgical History:  Procedure Laterality Date  . APPENDECTOMY  1984  . CATARACT EXTRACTION W/ INTRAOCULAR LENS  IMPLANT, BILATERAL  2014  . [place]   X3  . COLONOSCOPY  11/16/2013   w/Brodie   . FOOT SURGERY Left 2002  . KNEE SURGERY Left 1998  . ROTATOR CUFF REPAIR Right 2007  . TUBAL LIGATION  1994    There were no vitals filed for this visit.    Subjective Assessment - 11/24/20 0935    Subjective Patient reports "everything" is going on with the Lt foot. She reports plantar fascitis and scar tissue from a surgery years ago are what are currently giving her problems. She reports previous achilles issues, but this is better. She is having most of her pain on the bottom and along the medial side of the foot. She reports this has been ongoing for years, but has recently worsened in the past year. She received a cortisone injection last week, but reports no pain relief from this. She has f/u with physician in two weeks. She was previously receiving PT at another facility, though is switching care due to  insurance.    Pertinent History hypertension, anxiety, depression, obesity    Limitations Standing;Walking;House hold activities;Lifting    How long can you sit comfortably? no problem    How long can you stand comfortably? 20-30 minutes    How long can you walk comfortably? oh not long at all, 10 minutes    Diagnostic tests MRI: IMPRESSION:  1. Moderate tendinopathy and a longitudinal split type tear  involving the peroneus brevis tendon. Mild tenosynovitis involving  both peroneal tendons.  2. Distal tendinopathy involving the attachment fibers of the  posterior tibialis tendon.  3. Chronic appearing Achilles tendinopathy.  4. Suspect prior surgical changes or possibly prior tear of the  plantar fascia. No acute tear/rupture.  5. Moderate midfoot degenerative changes. No stress fracture or AVN.    Patient Stated Goals "I want to be able walk painlessly." "I have a trip to [place] in July and want to be able to do it pain free."    Currently in Pain? Yes    Pain Score 3     Pain Location Foot    Pain Orientation Left    Pain Descriptors / Indicators Tightness    Pain Type Chronic pain    Pain Onset More than a month ago  Pain Frequency Intermittent    Aggravating Factors  walking, standing    Pain Relieving Factors ice, rest    Effect of Pain on Daily Activities pain limits ability to complete walking and standing tasks              [facility] PT Assessment - 11/24/20 0001      Assessment   Medical Diagnosis M72.2 (ICD-10-CM) - Plantar fasciitis    Referring Provider (PT) Trula Slade, DPM    Onset Date/Surgical Date --   "for years"   Hand Dominance Right    Next MD Visit 12/05/20    Prior Therapy yes      Precautions   Precautions None      Restrictions   Weight Bearing Restrictions No      Balance Screen   Has the patient fallen in the past 6 months No      [place] residence    Living Arrangements Alone    Additional Comments 2  steps to enter      Prior Function   Level of [place] Retired      Associate Professor   Overall Cognitive Status Within Functional Limits for tasks assessed      Observation/Other Assessments   Observations arch support brace on Lt foot    Focus on Therapeutic Outcomes (FOTO)  43% function to 58% function      Sensation   Light Touch Appears Intact      Coordination   Gross Motor Movements are Fluid and Coordinated Yes      Posture/Postural Control   Posture Comments Lt foot pes planus, maintains foot in ER with standing      AROM   Overall AROM Comments PF AROM WNL. Rt ankle AROM WNL all planes    Right Ankle Dorsiflexion 11    Left Ankle Dorsiflexion 5    Left Ankle Inversion 10    Left Ankle Eversion 12      PROM   Overall PROM Comments great toe passive extension: Lt 20 Rt: 45      Strength   Overall Strength Comments pain with Lt eversion, inversion, PF    Right Hip Flexion 4+/5    Right Hip Extension 4/5    Right Hip ABduction 5/5    Left Hip Flexion 4/5    Left Hip Extension 4/5    Left Hip ABduction 4+/5    Right Knee Flexion 5/5    Right Knee Extension 5/5    Left Knee Flexion 5/5    Left Knee Extension 5/5    Right Ankle Dorsiflexion 5/5    Right Ankle Plantar Flexion 5/5    Right Ankle Inversion 5/5    Right Ankle Eversion 5/5    Left Ankle Dorsiflexion 5/5    Left Ankle Plantar Flexion 5/5    Left Ankle Inversion 5/5    Left Ankle Eversion 5/5      Ambulation/Gait   Gait Pattern Step-through pattern;Decreased stance time - left;Left foot flat;Poor foot clearance - left    Gait Comments excessive foot ER LLE      Balance   Balance Assessed Yes    Balance comment SLS RLE 30 sec LLE 5 sec                      Objective measurements completed on examination: See above findings.       [facility] Adult PT Treatment/Exercise -  11/24/20 0001      Self-Care   Self-Care Other Self-Care Comments    Other Self-Care  Comments  see patient education                  PT Education - 11/24/20 [id number]    Education Details Education on anatomy of current condition, POC, HEP, FOTO results and anticipated outcomes, TPDN    Person(s) Educated Patient    Methods Explanation;Demonstration;Handout    Comprehension Verbalized understanding;Returned demonstration            PT Short Term Goals - 11/24/20 1108      PT SHORT TERM GOAL #1   Title Patient will be independent and compliant with established HEP.    Baseline issued at eval.    Time 3    Period Weeks    Status New    Target Date 12/15/20      PT SHORT TERM GOAL #2   Title patient will demonstrate at least 10 degrees of Lt ankle DF AROM to improve gait mechanics.    Baseline 5    Time 4    Period Weeks    Status New    Target Date 12/22/20      PT SHORT TERM GOAL #3   Title patient will demonstrate at least 40 degrees of Lt great toe passive extension to improve toe-off during terminal stance/preswing on the LLE.    Baseline see flowsheet    Time 4    Period Weeks    Status New    Target Date 12/22/20             PT Long Term Goals - 11/24/20 1106      PT LONG TERM GOAL #1   Title Patient will score at least 58% function on FOTO to signify clinically meaningful improvement in functional abilities.    Baseline 43%    Time 8    Period Weeks    Status New    Target Date 01/19/21      PT LONG TERM GOAL #2   Title Patient will demonstrate 5/5 bilateral hip strength to improve stability about the chain for prolonged walking.    Baseline see flowsheet    Time 8    Period Weeks    Status New    Target Date 01/19/21      PT LONG TERM GOAL #3   Title Patient will demonstrate SLS on the LLE for at least 10 seconds to improve stability when walking on uneven terrain.    Baseline <5 seconds    Time 8    Period Weeks    Status New    Target Date 01/19/21      PT LONG TERM GOAL #4   Title Patient will self-report tolerating  at least 20 minutes of walking activity with less than 5/10 pain.    Baseline 10 minutes, 10/10 pain    Time 8    Period Weeks    Status New    Target Date 01/19/21                  Plan - 11/24/20 1115    Clinical Impression Statement Patient is a 65 y/o female with chief complaint of chronic Lt foot/ankle pain with current complaints of pain localized to plantar fascia and distal posterior tibialis. She was previously attending physical therapy at another clinic from November-December 2021 and recently received a cortisone injection, though admits little relief from either intervention. She has  full ankle strength, though pain with resisted plantarflexion, inversion, and eversion on the LLE as well as limited Lt ankle/foot AROM, palpable tenderness/tautness about plantar fascia and posterior tibialis, Lt pes planus, balance and gait deficits, bilateral hip weakness, and activity limitations secondary to pain. She will benefit from skilled PT to address above stated deficits in order to return to optimal function.    Personal Factors and Comorbidities Age;Time since onset of injury/illness/exacerbation;Other   previous PT   Examination-Activity Limitations Carry;Lift;Locomotion Level;Stairs;Stand    Examination-Participation Restrictions Cleaning;Meal Prep;Shop    Stability/Clinical Decision Making Stable/Uncomplicated    Clinical Decision Making Low    Rehab Potential Fair    PT Frequency 2x / week    PT Duration 8 weeks    PT Treatment/Interventions ADLs/Self Care Home Management;Cryotherapy;Electrical Stimulation;Iontophoresis 4mg /ml Dexamethasone;Moist Heat;Ultrasound;Gait training;Therapeutic activities;Therapeutic exercise;Balance training;Neuromuscular re-education;Patient/family education;Manual techniques;Passive range of motion;Dry needling;Taping;Vasopneumatic Device    PT Next Visit Plan review and update HEP. educate on proper footwear and modalities for pain, Foot  intrinsics, Manual therapy or consider TPDN. Hip strengthening.    PT Home Exercise Plan [id number]    Consulted and Agree with Plan of Care Patient           Patient will benefit from skilled therapeutic intervention in order to improve the following deficits and impairments:  Abnormal gait,Decreased range of motion,Difficulty walking,Decreased endurance,Decreased activity tolerance,Pain,Decreased balance,Impaired flexibility,Decreased strength  Visit Diagnosis: Pain in left foot  Stiffness of left ankle, not elsewhere classified  Muscle weakness (generalized)  Difficulty in walking, not elsewhere classified     Problem List Patient Active Problem List   Diagnosis Date Noted  . Asthma   . 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 Major depressive disorder, recurrent, severe without psychotic features ([place])   . 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 Suicide attempt by drug ingestion ([place])   . Medication management 08/22/2014  . Vitamin D deficiency 08/22/2014  . Hyperlipidemia, mixed 08/22/2014  . Hypertension   . Anxiety   . Abnormal glucose   . Class 2 severe obesity due to excess calories with serious comorbidity and body mass index (BMI) of 37.0 to 37.9 in adult [facility])    Gwendolyn Grant, PT, DPT, ATC 11/24/20 11:40 AM  [facility] [address] [place], [place], [address] Phone: [phone]   Fax:  [phone]  Name: Haley White MRN: [id number] Date of Birth: Mar 30, 1956

## 2020-11-29 ENCOUNTER — Ambulatory Visit: Payer: 59

## 2020-11-29 ENCOUNTER — Other Ambulatory Visit: Payer: Self-pay

## 2020-11-29 DIAGNOSIS — M6281 Muscle weakness (generalized): Secondary | ICD-10-CM

## 2020-11-29 DIAGNOSIS — M25672 Stiffness of left ankle, not elsewhere classified: Secondary | ICD-10-CM

## 2020-11-29 DIAGNOSIS — M79672 Pain in left foot: Secondary | ICD-10-CM | POA: Diagnosis not present

## 2020-11-29 DIAGNOSIS — R262 Difficulty in walking, not elsewhere classified: Secondary | ICD-10-CM

## 2020-11-29 NOTE — Therapy (Signed)
[place], [place], [address] Phone: [phone]   Fax:  [phone]  Physical Therapy Treatment  Patient Details  Name: Haley White MRN: [id number] Date of Birth: 1956/02/18 Referring Provider (PT): Trula Slade, [place]   Encounter Date: 11/29/2020   PT End of Session - 11/29/20 1155    Visit Number 2    Number of Visits 17    Date for PT Re-Evaluation 01/20/21    Authorization Type [employer]: 30 VL    PT Start Time 1150    PT Stop Time 1230    PT Time Calculation (min) 40 min    Activity Tolerance Patient tolerated treatment well    Behavior During Therapy [facility] for tasks assessed/performed           Past Medical History:  Diagnosis Date  . Anxiety   . Arthritis   . Asthma   . Cataract   . Gout   . Hyperlipidemia   . Hypertension   . Obese     Past Surgical History:  Procedure Laterality Date  . APPENDECTOMY  1984  . CATARACT EXTRACTION W/ INTRAOCULAR LENS  IMPLANT, BILATERAL  2014  . [place]   X3  . COLONOSCOPY  11/16/2013   w/Brodie   . FOOT SURGERY Left 2002  . KNEE SURGERY Left 1998  . ROTATOR CUFF REPAIR Right 2007  . TUBAL LIGATION  1994    There were no vitals filed for this visit.   Subjective Assessment - 11/29/20 1156    Subjective "It hurts." Patient reports compliance with HEP.    Currently in Pain? Yes    Pain Score 5     Pain Location Foot    Pain Orientation Left;Medial    Pain Descriptors / Indicators --   pulling "like a rubberband."   Pain Type Chronic pain    Pain Onset More than a month ago    Pain Frequency Intermittent                             [facility] Adult PT Treatment/Exercise - 11/29/20 0001      Self-Care   Other Self-Care Comments  see patient education      Knee/Hip Exercises: Aerobic   Nustep 5 minutes, level 6 LE only      Knee/Hip Exercises: Supine   Bridges Limitations 1 x 10     Straight Leg Raises Limitations 1 x 10 each      Knee/Hip Exercises: Sidelying   Hip ABduction Limitations 1 x 10 each      Manual Therapy   Manual therapy comments STM Lt gastroc/soleus, posterior tib, plantar fascia. passive calf stretch and great toe flexor                  PT Education - 11/29/20 1221    Education Details education on TPDN indications. education on foot anatomy. education on proper footwear and modoalities for pain control. updated HEP and issued tennis ball for self soft tissue mobilization    Person(s) Educated Patient    Methods Explanation;Demonstration;Handout    Comprehension Verbalized understanding;Returned demonstration            PT Short Term Goals - 11/24/20 1108      PT SHORT TERM GOAL #1   Title Patient will be independent and compliant with established HEP.    Baseline issued at eval.  Time 3    Period Weeks    Status New    Target Date 12/15/20      PT SHORT TERM GOAL #2   Title patient will demonstrate at least 10 degrees of Lt ankle DF AROM to improve gait mechanics.    Baseline 5    Time 4    Period Weeks    Status New    Target Date 12/22/20      PT SHORT TERM GOAL #3   Title patient will demonstrate at least 40 degrees of Lt great toe passive extension to improve toe-off during terminal stance/preswing on the LLE.    Baseline see flowsheet    Time 4    Period Weeks    Status New    Target Date 12/22/20             PT Long Term Goals - 11/24/20 1106      PT LONG TERM GOAL #1   Title Patient will score at least 58% function on FOTO to signify clinically meaningful improvement in functional abilities.    Baseline 43%    Time 8    Period Weeks    Status New    Target Date 01/19/21      PT LONG TERM GOAL #2   Title Patient will demonstrate 5/5 bilateral hip strength to improve stability about the chain for prolonged walking.    Baseline see flowsheet    Time 8    Period Weeks    Status New    Target Date  01/19/21      PT LONG TERM GOAL #3   Title Patient will demonstrate SLS on the LLE for at least 10 seconds to improve stability when walking on uneven terrain.    Baseline <5 seconds    Time 8    Period Weeks    Status New    Target Date 01/19/21      PT LONG TERM GOAL #4   Title Patient will self-report tolerating at least 20 minutes of walking activity with less than 5/10 pain.    Baseline 10 minutes, 10/10 pain    Time 8    Period Weeks    Status New    Target Date 01/19/21                 Plan - 11/29/20 1214    Clinical Impression Statement No notable restriction along gastroc/soleus, though significant tautness and palable tenderness about posterior tibialis and medial plantar fascia. Introduction to hip strenghtening to improve proximal stability with visible shaking in LLE during isolated hip flexor and hip abductor strengthening.    Personal Factors and Comorbidities Age;Time since onset of injury/illness/exacerbation;Other   previous PT   Examination-Activity Limitations Carry;Lift;Locomotion Level;Stairs;Stand    Examination-Participation Restrictions Cleaning;Meal Prep;Shop    Stability/Clinical Decision Making Stable/Uncomplicated    Rehab Potential Fair    PT Frequency 2x / week    PT Duration 8 weeks    PT Treatment/Interventions ADLs/Self Care Home Management;Cryotherapy;Electrical Stimulation;Iontophoresis 4mg /ml Dexamethasone;Moist Heat;Ultrasound;Gait training;Therapeutic activities;Therapeutic exercise;Balance training;Neuromuscular re-education;Patient/family education;Manual techniques;Passive range of motion;Dry needling;Taping;Vasopneumatic Device    PT Next Visit Plan manual therapy/TPDN posterior tibialis, plantar fascia, foot intrinsic strengthening    PT Home Exercise Plan [id number]    Consulted and Agree with Plan of Care Patient           Patient will benefit from skilled therapeutic intervention in order to improve the following deficits and  impairments:  Abnormal gait,Decreased range of motion,Difficulty  walking,Decreased endurance,Decreased activity tolerance,Pain,Decreased balance,Impaired flexibility,Decreased strength  Visit Diagnosis: Pain in left foot  Stiffness of left ankle, not elsewhere classified  Muscle weakness (generalized)  Difficulty in walking, not elsewhere classified     Problem List Patient Active Problem List   Diagnosis Date Noted  . Asthma   . 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 Major depressive disorder, recurrent, severe without psychotic features ([place])   . 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 Suicide attempt by drug ingestion ([place])   . Medication management 08/22/2014  . Vitamin D deficiency 08/22/2014  . Hyperlipidemia, mixed 08/22/2014  . Hypertension   . Anxiety   . Abnormal glucose   . Class 2 severe obesity due to excess calories with serious comorbidity and body mass index (BMI) of 37.0 to 37.9 in adult [facility])    Gwendolyn Grant, PT, DPT, ATC 11/29/20 12:36 PM  [place], [place], [address] Phone: [phone]   Fax:  [phone]  Name: Haley White MRN: [id number] Date of Birth: 05-Feb-1956

## 2020-12-04 ENCOUNTER — Ambulatory Visit: Payer: 59

## 2020-12-04 ENCOUNTER — Other Ambulatory Visit: Payer: Self-pay

## 2020-12-04 DIAGNOSIS — M79672 Pain in left foot: Secondary | ICD-10-CM | POA: Diagnosis not present

## 2020-12-04 DIAGNOSIS — M25672 Stiffness of left ankle, not elsewhere classified: Secondary | ICD-10-CM

## 2020-12-04 DIAGNOSIS — R262 Difficulty in walking, not elsewhere classified: Secondary | ICD-10-CM

## 2020-12-04 DIAGNOSIS — M6281 Muscle weakness (generalized): Secondary | ICD-10-CM

## 2020-12-04 NOTE — Therapy (Signed)
[place], [place], [address] Phone: [phone]   Fax:  [phone]  Physical Therapy Treatment  Patient Details  Name: Haley White MRN: [id number] Date of Birth: 11-27-1955 Referring Provider (PT): Trula Slade, [place]   Encounter Date: 12/04/2020   PT End of Session - 12/04/20 1000    Visit Number 3    Number of Visits 17    Date for PT Re-Evaluation 01/20/21    Authorization Type [employer]: 30 VL    PT Start Time 0934    PT Stop Time 1015    PT Time Calculation (min) 41 min    Activity Tolerance Patient tolerated treatment well    Behavior During Therapy [facility] for tasks assessed/performed           Past Medical History:  Diagnosis Date  . Anxiety   . Arthritis   . Asthma   . Cataract   . Gout   . Hyperlipidemia   . Hypertension   . Obese     Past Surgical History:  Procedure Laterality Date  . APPENDECTOMY  1984  . CATARACT EXTRACTION W/ INTRAOCULAR LENS  IMPLANT, BILATERAL  2014  . [place]   X3  . COLONOSCOPY  11/16/2013   w/Brodie   . FOOT SURGERY Left 2002  . KNEE SURGERY Left 1998  . ROTATOR CUFF REPAIR Right 2007  . TUBAL LIGATION  1994    There were no vitals filed for this visit.   Subjective Assessment - 12/04/20 0937    Subjective Patient reports a few days of being pain free, but began having pain over the weekend.    Currently in Pain? Yes    Pain Score 3     Pain Location Foot    Pain Orientation Left;Medial    Pain Descriptors / Indicators --   pulling   Pain Type Chronic pain    Pain Onset More than a month ago              [facility] PT Assessment - 12/04/20 0001      AROM   Right Ankle Dorsiflexion 13    Left Ankle Dorsiflexion 10                         [facility] Adult PT Treatment/Exercise - 12/04/20 0001      Self-Care   Other Self-Care Comments  see patient education      Manual Therapy   Manual  therapy comments STM/Trigger point release posterior tibialis, plantar fascia, passive calf stretch and great toe flexor stretch      Ankle Exercises: Seated   [place] Pickup 1 x 20    Other Seated Ankle Exercises arch lift 2 x 10; 5 sec hold      Ankle Exercises: Stretches   Soleus Stretch 60 seconds    Gastroc Stretch 60 seconds                  PT Education - 12/04/20 0957    Education Details Education on footwear with recommendation to go to [employer].Review of TPDN indications, expectations, side effects.    Person(s) Educated Patient    Methods Explanation    Comprehension Verbalized understanding            PT Short Term Goals - 12/04/20 1101      PT SHORT TERM GOAL #1   Title Patient will be independent  and compliant with established HEP.    Baseline issued at eval.    Time 3    Period Weeks    Status Achieved    Target Date 12/15/20      PT SHORT TERM GOAL #2   Title patient will demonstrate at least 10 degrees of Lt ankle DF AROM to improve gait mechanics.    Baseline 5    Time 4    Period Weeks    Status Achieved    Target Date 12/22/20      PT SHORT TERM GOAL #3   Title patient will demonstrate at least 40 degrees of Lt great toe passive extension to improve toe-off during terminal stance/preswing on the LLE.    Baseline see flowsheet    Time 4    Period Weeks    Status Deferred    Target Date 12/22/20             PT Long Term Goals - 11/24/20 1106      PT LONG TERM GOAL #1   Title Patient will score at least 58% function on FOTO to signify clinically meaningful improvement in functional abilities.    Baseline 43%    Time 8    Period Weeks    Status New    Target Date 01/19/21      PT LONG TERM GOAL #2   Title Patient will demonstrate 5/5 bilateral hip strength to improve stability about the chain for prolonged walking.    Baseline see flowsheet    Time 8    Period Weeks    Status New    Target Date 01/19/21      PT LONG TERM  GOAL #3   Title Patient will demonstrate SLS on the LLE for at least 10 seconds to improve stability when walking on uneven terrain.    Baseline <5 seconds    Time 8    Period Weeks    Status New    Target Date 01/19/21      PT LONG TERM GOAL #4   Title Patient will self-report tolerating at least 20 minutes of walking activity with less than 5/10 pain.    Baseline 10 minutes, 10/10 pain    Time 8    Period Weeks    Status New    Target Date 01/19/21                 Plan - 12/04/20 1102    Clinical Impression Statement Trigger points present along Lt posterior tibialis with partial release from manual therapy. Consider TPDN at future sessions if trigger points remain. Overall good tolerance to foot intrinsic strengthening and calf stretching with exception of soleus stretch on slant board as patient reported pain along dorsum of Rt foot, so stretch completed in long sitting without reports of discomfort. Patient educated on proper footwear as her toebox is too flexible.    Personal Factors and Comorbidities Age;Time since onset of injury/illness/exacerbation;Other   previous PT   Examination-Activity Limitations Carry;Lift;Locomotion Level;Stairs;Stand    Examination-Participation Restrictions Cleaning;Meal Prep;Shop    Stability/Clinical Decision Making Stable/Uncomplicated    Rehab Potential Fair    PT Frequency 2x / week    PT Duration 8 weeks    PT Treatment/Interventions ADLs/Self Care Home Management;Cryotherapy;Electrical Stimulation;Iontophoresis 4mg /ml Dexamethasone;Moist Heat;Ultrasound;Gait training;Therapeutic activities;Therapeutic exercise;Balance training;Neuromuscular re-education;Patient/family education;Manual techniques;Passive range of motion;Dry needling;Taping;Vasopneumatic Device    PT Next Visit Plan manual therapy/TPDN posterior tibialis, plantar fascia, foot intrinsic strengthening    PT Home Exercise  Plan [id number]    Consulted and Agree with Plan of  Care Patient           Patient will benefit from skilled therapeutic intervention in order to improve the following deficits and impairments:  Abnormal gait,Decreased range of motion,Difficulty walking,Decreased endurance,Decreased activity tolerance,Pain,Decreased balance,Impaired flexibility,Decreased strength  Visit Diagnosis: Pain in left foot  Stiffness of left ankle, not elsewhere classified  Muscle weakness (generalized)  Difficulty in walking, not elsewhere classified     Problem List Patient Active Problem List   Diagnosis Date Noted  . Asthma   . 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 Major depressive disorder, recurrent, severe without psychotic features ([place])   . 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 Suicide attempt by drug ingestion ([place])   . Medication management 08/22/2014  . Vitamin D deficiency 08/22/2014  . Hyperlipidemia, mixed 08/22/2014  . Hypertension   . Anxiety   . Abnormal glucose   . Class 2 severe obesity due to excess calories with serious comorbidity and body mass index (BMI) of 37.0 to 37.9 in adult [facility])    Gwendolyn Grant, PT, DPT, ATC 12/04/20 11:13 AM  [facility] [address] [place], [place], [address] Phone: [phone]   Fax:  [phone]  Name: Haley White MRN: [id number] Date of Birth: 04-Aug-1956

## 2020-12-05 ENCOUNTER — Other Ambulatory Visit: Payer: Self-pay

## 2020-12-05 ENCOUNTER — Ambulatory Visit (INDEPENDENT_AMBULATORY_CARE_PROVIDER_SITE_OTHER): Payer: 59 | Admitting: Podiatry

## 2020-12-05 DIAGNOSIS — M76829 Posterior tibial tendinitis, unspecified leg: Secondary | ICD-10-CM | POA: Diagnosis not present

## 2020-12-05 DIAGNOSIS — M722 Plantar fascial fibromatosis: Secondary | ICD-10-CM

## 2020-12-05 DIAGNOSIS — M779 Enthesopathy, unspecified: Secondary | ICD-10-CM | POA: Diagnosis not present

## 2020-12-08 ENCOUNTER — Other Ambulatory Visit: Payer: Self-pay

## 2020-12-08 ENCOUNTER — Ambulatory Visit: Payer: 59 | Attending: Podiatry | Admitting: Physical Therapy

## 2020-12-08 ENCOUNTER — Encounter: Payer: Self-pay | Admitting: Physical Therapy

## 2020-12-08 DIAGNOSIS — M6281 Muscle weakness (generalized): Secondary | ICD-10-CM | POA: Insufficient documentation

## 2020-12-08 DIAGNOSIS — M79672 Pain in left foot: Secondary | ICD-10-CM | POA: Insufficient documentation

## 2020-12-08 DIAGNOSIS — M25672 Stiffness of left ankle, not elsewhere classified: Secondary | ICD-10-CM | POA: Insufficient documentation

## 2020-12-08 DIAGNOSIS — R262 Difficulty in walking, not elsewhere classified: Secondary | ICD-10-CM | POA: Insufficient documentation

## 2020-12-08 NOTE — Therapy (Signed)
[place], [place], [address] Phone: [phone]   Fax:  [phone]  Physical Therapy Treatment  Patient Details  Name: Haley White MRN: [id number] Date of Birth: 03-18-1956 Referring Provider (PT): Trula Slade, [place]   Encounter Date: 12/08/2020   PT End of Session - 12/08/20 1041    Visit Number 4    Number of Visits 17    Date for PT Re-Evaluation 01/20/21    Authorization Type [employer]: 30 VL    PT Start Time 0930    PT Stop Time 1013    PT Time Calculation (min) 43 min    Activity Tolerance Patient tolerated treatment well    Behavior During Therapy [facility] for tasks assessed/performed           Past Medical History:  Diagnosis Date  . Anxiety   . Arthritis   . Asthma   . Cataract   . Gout   . Hyperlipidemia   . Hypertension   . Obese     Past Surgical History:  Procedure Laterality Date  . APPENDECTOMY  1984  . CATARACT EXTRACTION W/ INTRAOCULAR LENS  IMPLANT, BILATERAL  2014  . [place]   X3  . COLONOSCOPY  11/16/2013   w/Brodie   . FOOT SURGERY Left 2002  . KNEE SURGERY Left 1998  . ROTATOR CUFF REPAIR Right 2007  . TUBAL LIGATION  1994    There were no vitals filed for this visit.   Subjective Assessment - 12/08/20 0937    Subjective Patient continues to have pain on the inside of her foot. The pain has been worse over the past few days with the rain    Pertinent History hypertension, anxiety, depression, obesity    Limitations Standing;Walking;House hold activities;Lifting    How long can you sit comfortably? no problem    How long can you stand comfortably? 20-30 minutes    How long can you walk comfortably? oh not long at all, 10 minutes    Diagnostic tests MRI: IMPRESSION:  1. Moderate tendinopathy and a longitudinal split type tear  involving the peroneus brevis tendon. Mild tenosynovitis involving  both peroneal tendons.  2. Distal  tendinopathy involving the attachment fibers of the  posterior tibialis tendon.  3. Chronic appearing Achilles tendinopathy.  4. Suspect prior surgical changes or possibly prior tear of the  plantar fascia. No acute tear/rupture.  5. Moderate midfoot degenerative changes. No stress fracture or AVN.    Patient Stated Goals "I want to be able walk painlessly." "I have a trip to [place] in July and want to be able to do it pain free."    Currently in Pain? Yes    Pain Score 4     Pain Location Foot    Pain Orientation Left    Pain Descriptors / Indicators Aching    Pain Type Chronic pain    Pain Onset More than a month ago    Pain Frequency Intermittent    Aggravating Factors  more pain when she is up and on the foot    Pain Relieving Factors ice and rest    Effect of Pain on Daily Activities pain limits ability to stand and walk                             [facility] Adult PT Treatment/Exercise - 12/08/20 0001  Self-Care   Other Self-Care Comments  reviewed anklpe pumps and self soft tissue mobilization using IASTYM and TFM for home to improve post needle soreness. benefits and risks of TPDN.      Manual Therapy   Manual Therapy Soft tissue mobilization    Manual therapy comments skilled palpation of trigger points    Soft tissue mobilization TFM to plantar facia and medial calcaneal tubricle; IAASTM to both heads of the gastroc; TFM to achilles            Trigger Point Dry Needling - 12/08/20 0001    Consent Given? Yes    Education Handout Provided Yes    Muscles Treated Lower Quadrant Gastrocnemius    Dry Needling Comments 2 spots needled in the medial head 1 spot in the lateral. using a .30/50 needle    Gastrocnemius Response Twitch response elicited;Palpable increased muscle length                PT Education - 12/08/20 0939    Education Details reviewed benefits and risks of  TPDN    Person(s) Educated Patient    Methods  Explanation;Demonstration;Tactile cues;Verbal cues    Comprehension Returned demonstration;Verbal cues required;Verbalized understanding            PT Short Term Goals - 12/04/20 1101      PT SHORT TERM GOAL #1   Title Patient will be independent and compliant with established HEP.    Baseline issued at eval.    Time 3    Period Weeks    Status Achieved    Target Date 12/15/20      PT SHORT TERM GOAL #2   Title patient will demonstrate at least 10 degrees of Lt ankle DF AROM to improve gait mechanics.    Baseline 5    Time 4    Period Weeks    Status Achieved    Target Date 12/22/20      PT SHORT TERM GOAL #3   Title patient will demonstrate at least 40 degrees of Lt great toe passive extension to improve toe-off during terminal stance/preswing on the LLE.    Baseline see flowsheet    Time 4    Period Weeks    Status Deferred    Target Date 12/22/20             PT Long Term Goals - 11/24/20 1106      PT LONG TERM GOAL #1   Title Patient will score at least 58% function on FOTO to signify clinically meaningful improvement in functional abilities.    Baseline 43%    Time 8    Period Weeks    Status New    Target Date 01/19/21      PT LONG TERM GOAL #2   Title Patient will demonstrate 5/5 bilateral hip strength to improve stability about the chain for prolonged walking.    Baseline see flowsheet    Time 8    Period Weeks    Status New    Target Date 01/19/21      PT LONG TERM GOAL #3   Title Patient will demonstrate SLS on the LLE for at least 10 seconds to improve stability when walking on uneven terrain.    Baseline <5 seconds    Time 8    Period Weeks    Status New    Target Date 01/19/21      PT LONG TERM GOAL #4   Title Patient will self-report tolerating  at least 20 minutes of walking activity with less than 5/10 pain.    Baseline 10 minutes, 10/10 pain    Time 8    Period Weeks    Status New    Target Date 01/19/21                  Plan - 12/08/20 1041    Clinical Impression Statement Good twitch respose noted with trigger point dry needling. Therapy reviewed exercises and stretches to perfrom at home over the next few days. Therapy also reviewed with her self IASTYM and TFM to her plantar facia at home. She had increased tenderness to palpatino along the medial badn of the plantar facia. She will continue with soft titssue mobilization and exercises at home.    Personal Factors and Comorbidities Age;Time since onset of injury/illness/exacerbation;Other    Examination-Activity Limitations Carry;Lift;Locomotion Level;Stairs;Stand    Examination-Participation Restrictions Cleaning;Meal Prep;Shop    Stability/Clinical Decision Making Stable/Uncomplicated    Clinical Decision Making Low    Rehab Potential Fair    PT Frequency 2x / week    PT Duration 8 weeks    PT Treatment/Interventions ADLs/Self Care Home Management;Cryotherapy;Electrical Stimulation;Iontophoresis 4mg /ml Dexamethasone;Moist Heat;Ultrasound;Gait training;Therapeutic activities;Therapeutic exercise;Balance training;Neuromuscular re-education;Patient/family education;Manual techniques;Passive range of motion;Dry needling;Taping;Vasopneumatic Device    PT Next Visit Plan manual therapy/TPDN posterior tibialis, plantar fascia, foot intrinsic strengthening    PT Home Exercise Plan [id number]    Consulted and Agree with Plan of Care Patient           Patient will benefit from skilled therapeutic intervention in order to improve the following deficits and impairments:  Abnormal gait,Decreased range of motion,Difficulty walking,Decreased endurance,Decreased activity tolerance,Pain,Decreased balance,Impaired flexibility,Decreased strength  Visit Diagnosis: Pain in left foot  Stiffness of left ankle, not elsewhere classified  Muscle weakness (generalized)  Difficulty in walking, not elsewhere classified     Problem List Patient Active Problem List    Diagnosis Date Noted  . Asthma   . 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 Major depressive disorder, recurrent, severe without psychotic features ([place])   . 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 Suicide attempt by drug ingestion ([place])   . Medication management 08/22/2014  . Vitamin D deficiency 08/22/2014  . Hyperlipidemia, mixed 08/22/2014  . Hypertension   . Anxiety   . Abnormal glucose   . Class 2 severe obesity due to excess calories with serious comorbidity and body mass index (BMI) of 37.0 to 37.9 in adult [facility])     Carney Living 12/08/2020, 2:03 PM  [facility] [address] [place], [place], [address] Phone: [phone]   Fax:  [phone]  Name: Haley White MRN: [id number] Date of Birth: [birth year]

## 2020-12-10 NOTE — Progress Notes (Signed)
Subjective: 65 year old female presents the office today for follow-up evaluation of foot pain.  She states that she is actually making improvement since I last saw her with the change in physical therapy.  She feels that overall her pain is improving.  She denies any recent injury falls or changes otherwise since I last saw her.  Pain is still intermittent.  She is scheduled to start dry needling on Friday which she is nervous about. Denies any systemic complaints such as fevers, chills, nausea, vomiting. No acute changes since last appointment, and no other complaints at this time.   Objective: AAO x3, NAD DP/PT pulses palpable bilaterally, CRT less than 3 seconds There is no significant discomfort along the course or insertion of the peroneal tendons, posterior tibial tendons of flexor, extensor tendons.  The is improved tenderness today is on the plantar medial tubercle of the calcaneus and insertion of plantar fascia as well as along the arch of the foot on the plantar fascia.  She is a mild discomfort but overall improving.  Decreased medial arch height.  No pain to the Achilles tendon.  MMT 5/5. No pain with calf compression, swelling, warmth, erythema  Assessment: Plantar fasciitis left foot  Plan: -All treatment options discussed with the patient including all alternatives, risks, complications.  -We held off on a repeat steroid injection today.  She is to continue physical therapy.  She can start dry needling on Friday.  If needed we can do another injection but overall she is improving.  Continue with supportive shoes, arch supports.  Trula Slade DPM

## 2020-12-13 ENCOUNTER — Other Ambulatory Visit: Payer: Self-pay

## 2020-12-13 ENCOUNTER — Ambulatory Visit: Payer: 59

## 2020-12-13 DIAGNOSIS — R262 Difficulty in walking, not elsewhere classified: Secondary | ICD-10-CM

## 2020-12-13 DIAGNOSIS — M25672 Stiffness of left ankle, not elsewhere classified: Secondary | ICD-10-CM

## 2020-12-13 DIAGNOSIS — M79672 Pain in left foot: Secondary | ICD-10-CM

## 2020-12-13 DIAGNOSIS — M6281 Muscle weakness (generalized): Secondary | ICD-10-CM

## 2020-12-13 NOTE — Therapy (Signed)
[place], [place], [address] Phone: [phone]   Fax:  [phone]  Physical Therapy Treatment  Patient Details  Name: Haley White MRN: [id number] Date of Birth: 1955-11-28 Referring Provider (PT): Trula Slade, [place]   Encounter Date: 12/13/2020   PT End of Session - 12/13/20 1231    Visit Number 5    Number of Visits 17    Date for PT Re-Evaluation 01/20/21    Authorization Type [employer]: 30 VL    PT Start Time 1232    PT Stop Time 1315    PT Time Calculation (min) 43 min    Activity Tolerance Patient tolerated treatment well    Behavior During Therapy [facility] for tasks assessed/performed           Past Medical History:  Diagnosis Date  . Anxiety   . Arthritis   . Asthma   . Cataract   . Gout   . Hyperlipidemia   . Hypertension   . Obese     Past Surgical History:  Procedure Laterality Date  . APPENDECTOMY  1984  . CATARACT EXTRACTION W/ INTRAOCULAR LENS  IMPLANT, BILATERAL  2014  . [place]   X3  . COLONOSCOPY  11/16/2013   w/Brodie   . FOOT SURGERY Left 2002  . KNEE SURGERY Left 1998  . ROTATOR CUFF REPAIR Right 2007  . TUBAL LIGATION  1994    There were no vitals filed for this visit.   Subjective Assessment - 12/13/20 1234    Subjective patient reports she wasn't sore after TPDN at last session, but her foot has been hurting her the past couple of days, maybe because of the weather.    Pertinent History hypertension, anxiety, depression, obesity    Limitations Standing;Walking;House hold activities;Lifting    How long can you sit comfortably? no problem    How long can you stand comfortably? 20-30 minutes    How long can you walk comfortably? oh not long at all, 10 minutes    Diagnostic tests MRI: IMPRESSION:  1. Moderate tendinopathy and a longitudinal split type tear  involving the peroneus brevis tendon. Mild tenosynovitis involving  both  peroneal tendons.  2. Distal tendinopathy involving the attachment fibers of the  posterior tibialis tendon.  3. Chronic appearing Achilles tendinopathy.  4. Suspect prior surgical changes or possibly prior tear of the  plantar fascia. No acute tear/rupture.  5. Moderate midfoot degenerative changes. No stress fracture or AVN.    Patient Stated Goals "I want to be able walk painlessly." "I have a trip to [place] in July and want to be able to do it pain free."    Currently in Pain? Yes    Pain Score 4     Pain Location Foot    Pain Orientation Left;Medial    Pain Descriptors / Indicators Aching;Other (Comment)   pulling   Pain Type Chronic pain    Pain Onset More than a month ago    Pain Frequency Intermittent              [facility] PT Assessment - 12/13/20 0001      PROM   Overall PROM Comments great toe passive extension: Lt 45                         [facility] Adult PT Treatment/Exercise - 12/13/20 0001      Self-Care  Other Self-Care Comments  see patient education      Manual Therapy   Soft tissue mobilization to plantar fascia, posterior tibialis    Passive ROM passive streching gastroc/soleus and great toe flexor Lt      Ankle Exercises: Supine   Other Supine Ankle Exercises foot intrinsic: plantarflexion with toe extension, dorsiflexion with toe flexion x 20 reps      Ankle Exercises: Stretches   Gastroc Stretch 60 seconds    Other Stretch great toe extension 2  x 20 sec      Ankle Exercises: Seated   Other Seated Ankle Exercises resisted plantarflexion with inversion 2 x 10 red TB, attempted resisted plantarflexion with eversion (pain), isometric plantarflexion with eversion 1 x 10, 5 sec hold    Other Seated Ankle Exercises great toe flexion/extension 2 x 10                  PT Education - 12/13/20 1321    Education Details updated HEP. education on foot anatomy, review of modalities for pain control.    Person(s) Educated Patient    Methods  Explanation;Demonstration    Comprehension Verbalized understanding;Returned demonstration            PT Short Term Goals - 12/13/20 1252      PT SHORT TERM GOAL #1   Title Patient will be independent and compliant with established HEP.    Baseline issued at eval.    Time 3    Period Weeks    Status Achieved    Target Date 12/15/20      PT SHORT TERM GOAL #2   Title patient will demonstrate at least 10 degrees of Lt ankle DF AROM to improve gait mechanics.    Baseline 5    Time 4    Period Weeks    Status Achieved    Target Date 12/22/20      PT SHORT TERM GOAL #3   Title patient will demonstrate at least 40 degrees of Lt great toe passive extension to improve toe-off during terminal stance/preswing on the LLE.    Baseline see flowsheet    Time 4    Period Weeks    Status Achieved    Target Date 12/22/20             PT Long Term Goals - 11/24/20 1106      PT LONG TERM GOAL #1   Title Patient will score at least 58% function on FOTO to signify clinically meaningful improvement in functional abilities.    Baseline 43%    Time 8    Period Weeks    Status New    Target Date 01/19/21      PT LONG TERM GOAL #2   Title Patient will demonstrate 5/5 bilateral hip strength to improve stability about the chain for prolonged walking.    Baseline see flowsheet    Time 8    Period Weeks    Status New    Target Date 01/19/21      PT LONG TERM GOAL #3   Title Patient will demonstrate SLS on the LLE for at least 10 seconds to improve stability when walking on uneven terrain.    Baseline <5 seconds    Time 8    Period Weeks    Status New    Target Date 01/19/21      PT LONG TERM GOAL #4   Title Patient will self-report tolerating at least 20 minutes of walking  activity with less than 5/10 pain.    Baseline 10 minutes, 10/10 pain    Time 8    Period Weeks    Status New    Target Date 01/19/21                 Plan - 12/13/20 1231    Clinical Impression  Statement Patient has met established short term functional goals at this time. She continues to have tautness and palpable tenderness about the medial plantar fascia and posterior tibialis and will potentially benefit from further TPDN at future sessions. Able to progress foot intrinsic strengthening with patient quickly fatiguing during great toe flexor/extensor strengthening reporting tightness along flexor hallicus longus after exercise that is relieved with stretching. Overall good tolerance to progression of strengthening with exception of resisted plantarflexion/eversion as this causes pain along posterior tibialis.    Personal Factors and Comorbidities Age;Time since onset of injury/illness/exacerbation;Other    Examination-Activity Limitations Carry;Lift;Locomotion Level;Stairs;Stand    Examination-Participation Restrictions Cleaning;Meal Prep;Shop    Stability/Clinical Decision Making Stable/Uncomplicated    Rehab Potential Fair    PT Frequency 2x / week    PT Duration 8 weeks    PT Treatment/Interventions ADLs/Self Care Home Management;Cryotherapy;Electrical Stimulation;Iontophoresis 53m/ml Dexamethasone;Moist Heat;Ultrasound;Gait training;Therapeutic activities;Therapeutic exercise;Balance training;Neuromuscular re-education;Patient/family education;Manual techniques;Passive range of motion;Dry needling;Taping;Vasopneumatic Device    PT Next Visit Plan FOTO. manual therapy/TPDN posterior tibialis, plantar fascia, foot intrinsic strengthening    PT Home Exercise Plan [id number]    Consulted and Agree with Plan of Care Patient           Patient will benefit from skilled therapeutic intervention in order to improve the following deficits and impairments:  Abnormal gait,Decreased range of motion,Difficulty walking,Decreased endurance,Decreased activity tolerance,Pain,Decreased balance,Impaired flexibility,Decreased strength  Visit Diagnosis: Pain in left foot  Stiffness of left ankle, not  elsewhere classified  Muscle weakness (generalized)  Difficulty in walking, not elsewhere classified     Problem List Patient Active Problem List   Diagnosis Date Noted  . Asthma   . 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 Major depressive disorder, recurrent, severe without psychotic features ([place]   . 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 Su[place] attempt by drug ingestion ([place]   . Medication management 08/22/2014  . Vitamin D deficiency 08/22/2014  . Hyperlipidemia, mixed 08/22/2014  . Hypertension   . Anxiety   . Abnormal glucose   . Class 2 severe obesity due to excess calories with serious comorbidity and body mass index (BMI) of 37.0 to 37.9 in adult ([facility]    SGwendolyn Grant PT, DPT, ATC 12/13/20 1:27 PM [facility] [address]Phone: [phone]  Fax:  [phone] Name: Haley WASHINTONMRN: [id number] of Birth: 3August 01, 1957

## 2020-12-15 ENCOUNTER — Ambulatory Visit: Payer: 59 | Admitting: Physical Therapy

## 2020-12-15 ENCOUNTER — Encounter: Payer: Self-pay | Admitting: Physical Therapy

## 2020-12-20 ENCOUNTER — Ambulatory Visit: Payer: 59

## 2020-12-20 ENCOUNTER — Other Ambulatory Visit: Payer: Self-pay

## 2020-12-20 DIAGNOSIS — M79672 Pain in left foot: Secondary | ICD-10-CM | POA: Diagnosis not present

## 2020-12-20 DIAGNOSIS — M6281 Muscle weakness (generalized): Secondary | ICD-10-CM

## 2020-12-20 DIAGNOSIS — M25672 Stiffness of left ankle, not elsewhere classified: Secondary | ICD-10-CM

## 2020-12-20 DIAGNOSIS — R262 Difficulty in walking, not elsewhere classified: Secondary | ICD-10-CM

## 2020-12-20 NOTE — Therapy (Signed)
[place], [place], [address] Phone: [phone]   Fax:  [phone]  Physical Therapy Treatment  Patient Details  Name: Haley White MRN: [id number] Date of Birth: Nov 29, 1955 Referring Provider (PT): Trula Slade, [place]   Encounter Date: 12/20/2020   PT End of Session - 12/20/20 1151    Visit Number 6    Number of Visits 17    Date for PT Re-Evaluation 01/20/21    Authorization Type [employer]: 30 VL    PT Start Time 1147    PT Stop Time 1230    PT Time Calculation (min) 43 min    Activity Tolerance Patient tolerated treatment well    Behavior During Therapy [facility] for tasks assessed/performed           Past Medical History:  Diagnosis Date  . Anxiety   . Arthritis   . Asthma   . Cataract   . Gout   . Hyperlipidemia   . Hypertension   . Obese     Past Surgical History:  Procedure Laterality Date  . APPENDECTOMY  1984  . CATARACT EXTRACTION W/ INTRAOCULAR LENS  IMPLANT, BILATERAL  2014  . [place]   X3  . COLONOSCOPY  11/16/2013   w/Brodie   . FOOT SURGERY Left 2002  . KNEE SURGERY Left 1998  . ROTATOR CUFF REPAIR Right 2007  . TUBAL LIGATION  1994    There were no vitals filed for this visit.   Subjective Assessment - 12/20/20 1150    Subjective Patient reports it's not doing to well today.    Pertinent History hypertension, anxiety, depression, obesity    Limitations Standing;Walking;House hold activities;Lifting    How long can you sit comfortably? no problem    How long can you stand comfortably? 20-30 minutes    How long can you walk comfortably? oh not long at all, 10 minutes    Diagnostic tests MRI: IMPRESSION:  1. Moderate tendinopathy and a longitudinal split type tear  involving the peroneus brevis tendon. Mild tenosynovitis involving  both peroneal tendons.  2. Distal tendinopathy involving the attachment fibers of the  posterior tibialis  tendon.  3. Chronic appearing Achilles tendinopathy.  4. Suspect prior surgical changes or possibly prior tear of the  plantar fascia. No acute tear/rupture.  5. Moderate midfoot degenerative changes. No stress fracture or AVN.    Patient Stated Goals "I want to be able walk painlessly." "I have a trip to [place] in July and want to be able to do it pain free."    Currently in Pain? Yes    Pain Score 7     Pain Location Foot    Pain Orientation Left;Medial    Pain Descriptors / Indicators --   "like a rubber band that is about to pop"   Pain Type Chronic pain    Pain Onset More than a month ago              [facility] PT Assessment - 12/20/20 0001      Observation/Other Assessments   Focus on Therapeutic Outcomes (FOTO)  50% function                         [facility] Adult PT Treatment/Exercise - 12/20/20 0001      Ambulation/Gait   Ambulation Distance (Feet) 25 Feet    Gait Comments post taping      Self-Care  Other Self-Care Comments  see patient education      Manual Therapy   Kinesiotex --   K tape fan strip along plantar fascia 25% tension; arch taping     Ankle Exercises: Seated   Towel Inversion/Eversion 1 rep   on 4 inch step to raise height   Heel Raises 20 reps   x2   Other Seated Ankle Exercises resisted plantarflexion with eversion 2 x 10 red band    Other Seated Ankle Exercises rockerboard A/P x 20                  PT Education - 12/20/20 1237    Education Details Education on continuing wear of arch taping for the remainder of the day and to remove if any signs of skin irritation occur. Education on FOTO score.    Person(s) Educated Patient    Methods Explanation    Comprehension Verbalized understanding            PT Short Term Goals - 12/13/20 1252      PT SHORT TERM GOAL #1   Title Patient will be independent and compliant with established HEP.    Baseline issued at eval.    Time 3    Period Weeks    Status Achieved    Target  Date 12/15/20      PT SHORT TERM GOAL #2   Title patient will demonstrate at least 10 degrees of Lt ankle DF AROM to improve gait mechanics.    Baseline 5    Time 4    Period Weeks    Status Achieved    Target Date 12/22/20      PT SHORT TERM GOAL #3   Title patient will demonstrate at least 40 degrees of Lt great toe passive extension to improve toe-off during terminal stance/preswing on the LLE.    Baseline see flowsheet    Time 4    Period Weeks    Status Achieved    Target Date 12/22/20             PT Long Term Goals - 11/24/20 1106      PT LONG TERM GOAL #1   Title Patient will score at least 58% function on FOTO to signify clinically meaningful improvement in functional abilities.    Baseline 43%    Time 8    Period Weeks    Status New    Target Date 01/19/21      PT LONG TERM GOAL #2   Title Patient will demonstrate 5/5 bilateral hip strength to improve stability about the chain for prolonged walking.    Baseline see flowsheet    Time 8    Period Weeks    Status New    Target Date 01/19/21      PT LONG TERM GOAL #3   Title Patient will demonstrate SLS on the LLE for at least 10 seconds to improve stability when walking on uneven terrain.    Baseline <5 seconds    Time 8    Period Weeks    Status New    Target Date 01/19/21      PT LONG TERM GOAL #4   Title Patient will self-report tolerating at least 20 minutes of walking activity with less than 5/10 pain.    Baseline 10 minutes, 10/10 pain    Time 8    Period Weeks    Status New    Target Date 01/19/21  Plan - 12/20/20 1224    Clinical Impression Statement Trialed arch taping during today's session with patient reporting her foot felt more supported, though could not tell a difference in pain with ambulating short distance at this time. Patient recommended to wear tape for remainder of the day to determine if arch support helps with her pain, which could indicate potential  benefit of orthotics if she has any pain relief. Patient able to complete partial range of inversion and eversion strengthening, reporting pulling sensation along posterior tibialis if she completes full ROM.    Personal Factors and Comorbidities Age;Time since onset of injury/illness/exacerbation;Other    Examination-Activity Limitations Carry;Lift;Locomotion Level;Stairs;Stand    Examination-Participation Restrictions Cleaning;Meal Prep;Shop    Stability/Clinical Decision Making Stable/Uncomplicated    Rehab Potential Fair    PT Frequency 2x / week    PT Duration 8 weeks    PT Treatment/Interventions ADLs/Self Care Home Management;Cryotherapy;Electrical Stimulation;Iontophoresis 4mg /ml Dexamethasone;Moist Heat;Ultrasound;Gait training;Therapeutic activities;Therapeutic exercise;Balance training;Neuromuscular re-education;Patient/family education;Manual techniques;Passive range of motion;Dry needling;Taping;Vasopneumatic Device    PT Next Visit Plan assess response to tape. manual therapy/TPDN posterior tibialis, plantar fascia, foot intrinsic strengthening    PT Home Exercise Plan [id number]    Consulted and Agree with Plan of Care Patient           Patient will benefit from skilled therapeutic intervention in order to improve the following deficits and impairments:  Abnormal gait,Decreased range of motion,Difficulty walking,Decreased endurance,Decreased activity tolerance,Pain,Decreased balance,Impaired flexibility,Decreased strength  Visit Diagnosis: Pain in left foot  Stiffness of left ankle, not elsewhere classified  Muscle weakness (generalized)  Difficulty in walking, not elsewhere classified     Problem List Patient Active Problem List   Diagnosis Date Noted  . Asthma   . 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 Major depressive disorder, recurrent, severe without psychotic features ([place])   . 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 Suicide attempt by drug ingestion ([place])   . Medication  management 08/22/2014  . Vitamin D deficiency 08/22/2014  . Hyperlipidemia, mixed 08/22/2014  . Hypertension   . Anxiety   . Abnormal glucose   . Class 2 severe obesity due to excess calories with serious comorbidity and body mass index (BMI) of 37.0 to 37.9 in adult [facility])    Gwendolyn Grant, PT, DPT, ATC 12/20/20 12:41 PM  [place] [facility] [address] [place], [place], [address] Phone: [phone]   Fax:  [phone]  Name: Haley White MRN: [id number] Date of Birth: December 24, 1955

## 2020-12-22 ENCOUNTER — Encounter: Payer: Self-pay | Admitting: Physical Therapy

## 2020-12-22 ENCOUNTER — Ambulatory Visit: Payer: 59 | Admitting: Physical Therapy

## 2020-12-22 ENCOUNTER — Other Ambulatory Visit: Payer: Self-pay

## 2020-12-22 DIAGNOSIS — M79672 Pain in left foot: Secondary | ICD-10-CM | POA: Diagnosis not present

## 2020-12-22 DIAGNOSIS — R262 Difficulty in walking, not elsewhere classified: Secondary | ICD-10-CM

## 2020-12-22 DIAGNOSIS — M6281 Muscle weakness (generalized): Secondary | ICD-10-CM

## 2020-12-22 DIAGNOSIS — M25672 Stiffness of left ankle, not elsewhere classified: Secondary | ICD-10-CM

## 2020-12-22 NOTE — Therapy (Signed)
[place], [place], [address] Phone: [phone]   Fax:  [phone]  Physical Therapy Treatment  Patient Details  Name: Haley White MRN: [id number] Date of Birth: 13-May-1956 Referring Provider (PT): Trula Slade, [place]   Encounter Date: 12/22/2020   PT End of Session - 12/22/20 1442    Visit Number 7    Number of Visits 17    Date for PT Re-Evaluation 01/20/21    Authorization Type [employer]: 30 VL    PT Start Time 0930    PT Stop Time 1012    PT Time Calculation (min) 42 min    Activity Tolerance Patient tolerated treatment well    Behavior During Therapy [facility] for tasks assessed/performed           Past Medical History:  Diagnosis Date  . Anxiety   . Arthritis   . Asthma   . Cataract   . Gout   . Hyperlipidemia   . Hypertension   . Obese     Past Surgical History:  Procedure Laterality Date  . APPENDECTOMY  1984  . CATARACT EXTRACTION W/ INTRAOCULAR LENS  IMPLANT, BILATERAL  2014  . [place]   X3  . COLONOSCOPY  11/16/2013   w/Brodie   . FOOT SURGERY Left 2002  . KNEE SURGERY Left 1998  . ROTATOR CUFF REPAIR Right 2007  . TUBAL LIGATION  1994    There were no vitals filed for this visit.   Subjective Assessment - 12/22/20 0935    Subjective Patient reports no significant difference. She had significant pain last night when she had been on her feet for some time. She has pain this morning but it is not as bad. She  has notnoticed a difference with her new shoes.    Pertinent History hypertension, anxiety, depression, obesity    Limitations Standing;Walking;House hold activities;Lifting    How long can you sit comfortably? no problem    How long can you stand comfortably? 20-30 minutes    How long can you walk comfortably? oh not long at all, 10 minutes    Diagnostic tests MRI: IMPRESSION:  1. Moderate tendinopathy and a longitudinal split type  tear  involving the peroneus brevis tendon. Mild tenosynovitis involving  both peroneal tendons.  2. Distal tendinopathy involving the attachment fibers of the  posterior tibialis tendon.  3. Chronic appearing Achilles tendinopathy.  4. Suspect prior surgical changes or possibly prior tear of the  plantar fascia. No acute tear/rupture.  5. Moderate midfoot degenerative changes. No stress fracture or AVN.    Currently in Pain? Yes    Pain Score 5     Pain Location Foot    Pain Orientation Left    Pain Descriptors / Indicators Aching    Pain Onset More than a month ago    Pain Frequency Intermittent    Aggravating Factors  more pain when sh is up and walking    Pain Relieving Factors ice and rest    Effect of Pain on Daily Activities pain limits ability to stand and walk                             [facility] Adult PT Treatment/Exercise - 12/22/20 0001      Manual Therapy   Manual Therapy Soft tissue mobilization    Manual therapy comments skilled palpation of trigger points  Soft tissue mobilization to plantar fascia, posterior tibialis, peroneals and gastroc    Passive ROM light passive stretching of the calf            Trigger Point Dry Needling - 12/22/20 0001    Muscles Treated Lower Quadrant Quadratus plantae;Posterior tibialis    Posterior tibialis Response Twitch response elicited    Quadatus plantae response Twitch response elicited                PT Education - 12/22/20 1441    Education Details reviewed benefits of dry needling, where to buy a dorsal night splint    Person(s) Educated Patient    Methods Explanation;Demonstration;Tactile cues    Comprehension Verbalized understanding;Returned demonstration;Verbal cues required;Tactile cues required            PT Short Term Goals - 12/13/20 1252      PT SHORT TERM GOAL #1   Title Patient will be independent and compliant with established HEP.    Baseline issued at eval.    Time 3    Period  Weeks    Status Achieved    Target Date 12/15/20      PT SHORT TERM GOAL #2   Title patient will demonstrate at least 10 degrees of Lt ankle DF AROM to improve gait mechanics.    Baseline 5    Time 4    Period Weeks    Status Achieved    Target Date 12/22/20      PT SHORT TERM GOAL #3   Title patient will demonstrate at least 40 degrees of Lt great toe passive extension to improve toe-off during terminal stance/preswing on the LLE.    Baseline see flowsheet    Time 4    Period Weeks    Status Achieved    Target Date 12/22/20             PT Long Term Goals - 11/24/20 1106      PT LONG TERM GOAL #1   Title Patient will score at least 58% function on FOTO to signify clinically meaningful improvement in functional abilities.    Baseline 43%    Time 8    Period Weeks    Status New    Target Date 01/19/21      PT LONG TERM GOAL #2   Title Patient will demonstrate 5/5 bilateral hip strength to improve stability about the chain for prolonged walking.    Baseline see flowsheet    Time 8    Period Weeks    Status New    Target Date 01/19/21      PT LONG TERM GOAL #3   Title Patient will demonstrate SLS on the LLE for at least 10 seconds to improve stability when walking on uneven terrain.    Baseline <5 seconds    Time 8    Period Weeks    Status New    Target Date 01/19/21      PT LONG TERM GOAL #4   Title Patient will self-report tolerating at least 20 minutes of walking activity with less than 5/10 pain.    Baseline 10 minutes, 10/10 pain    Time 8    Period Weeks    Status New    Target Date 01/19/21                 Plan - 12/22/20 1445    Clinical Impression Statement Patient continues to havesignificant pain in her plantar facia, peroneals, and  posterior tib. Therapy needed her quadratus pkantae as well as her posterior tib, She worked on trigger point release along all tendons. she had significant trigger points in all muscles listed above. She was  advised to continue with her exercixes at home. She was shown where to purchase a dorsal night splint to see if a low load long term stretch may be beneficial.    Personal Factors and Comorbidities Age;Time since onset of injury/illness/exacerbation;Other    Examination-Activity Limitations Carry;Lift;Locomotion Level;Stairs;Stand    Examination-Participation Restrictions Cleaning;Meal Prep;Shop    Stability/Clinical Decision Making Stable/Uncomplicated    Clinical Decision Making Low    Rehab Potential Fair    PT Frequency 2x / week    PT Duration 8 weeks    PT Treatment/Interventions ADLs/Self Care Home Management;Cryotherapy;Electrical Stimulation;Iontophoresis 4mg /ml Dexamethasone;Moist Heat;Ultrasound;Gait training;Therapeutic activities;Therapeutic exercise;Balance training;Neuromuscular re-education;Patient/family education;Manual techniques;Passive range of motion;Dry needling;Taping;Vasopneumatic Device    PT Next Visit Plan assess response to tape. manual therapy/TPDN posterior tibialis, plantar fascia, foot intrinsic strengthening    PT Home Exercise Plan [id number]    Consulted and Agree with Plan of Care Patient           Patient will benefit from skilled therapeutic intervention in order to improve the following deficits and impairments:  Abnormal gait,Decreased range of motion,Difficulty walking,Decreased endurance,Decreased activity tolerance,Pain,Decreased balance,Impaired flexibility,Decreased strength  Visit Diagnosis: Pain in left foot  Stiffness of left ankle, not elsewhere classified  Muscle weakness (generalized)  Difficulty in walking, not elsewhere classified     Problem List Patient Active Problem List   Diagnosis Date Noted  . Asthma   . 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 Major depressive disorder, recurrent, severe without psychotic features ([place])   . 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 Suicide attempt by drug ingestion ([place])   . Medication management  08/22/2014  . Vitamin D deficiency 08/22/2014  . Hyperlipidemia, mixed 08/22/2014  . Hypertension   . Anxiety   . Abnormal glucose   . Class 2 severe obesity due to excess calories with serious comorbidity and body mass index (BMI) of 37.0 to 37.9 in adult [facility])     Carney Living PT DPT  12/22/2020, 2:49 PM  [facility] [address] [place], [place], [address] Phone: [phone]   Fax:  [phone]  Name: Haley White MRN: [id number] Date of Birth: 08-Mar-1956

## 2020-12-27 ENCOUNTER — Encounter: Payer: Self-pay | Admitting: Physical Therapy

## 2020-12-27 ENCOUNTER — Other Ambulatory Visit: Payer: Self-pay

## 2020-12-27 ENCOUNTER — Ambulatory Visit: Payer: 59 | Admitting: Physical Therapy

## 2020-12-27 DIAGNOSIS — M79672 Pain in left foot: Secondary | ICD-10-CM

## 2020-12-27 DIAGNOSIS — R262 Difficulty in walking, not elsewhere classified: Secondary | ICD-10-CM

## 2020-12-27 DIAGNOSIS — M25672 Stiffness of left ankle, not elsewhere classified: Secondary | ICD-10-CM

## 2020-12-27 DIAGNOSIS — M6281 Muscle weakness (generalized): Secondary | ICD-10-CM

## 2020-12-27 NOTE — Therapy (Addendum)
[place], [place], [address] Phone: [phone]   Fax:  [phone]  Physical Therapy Treatment/Discharge   Patient Details  Name: Haley White MRN: [id number] Date of Birth: [birth year] Referring Provider (PT): Trula Slade, [place]   Encounter Date: 12/27/2020   PT End of Session - 12/27/20 2100    Visit Number 8    Number of Visits 17    Date for PT Re-Evaluation 01/20/21    Authorization Type [employer]: 30 VL    PT Start Time 0930    PT Stop Time 1010    PT Time Calculation (min) 40 min    Activity Tolerance Patient tolerated treatment well    Behavior During Therapy [facility] for tasks assessed/performed           Past Medical History:  Diagnosis Date  . Anxiety   . Arthritis   . Asthma   . Cataract   . Gout   . Hyperlipidemia   . Hypertension   . Obese     Past Surgical History:  Procedure Laterality Date  . APPENDECTOMY  1984  . CATARACT EXTRACTION W/ INTRAOCULAR LENS  IMPLANT, BILATERAL  2014  . [place]   X3  . COLONOSCOPY  11/16/2013   w/Brodie   . FOOT SURGERY Left 2002  . KNEE SURGERY Left 1998  . ROTATOR CUFF REPAIR Right 2007  . TUBAL LIGATION  1994    There were no vitals filed for this visit.   Subjective Assessment - 12/27/20 2054    Subjective Patient continues to report no improvement. She is having significant soreness in standing. She is also having pain in the morning. She tried the dorsla night splint but she has pain 2nd to bone sprus in the top of her foot.    Pertinent History hypertension, anxiety, depression, obesity    Limitations Standing;Walking;House hold activities;Lifting    How long can you sit comfortably? no problem    How long can you stand comfortably? 20-30 minutes    How long can you walk comfortably? oh not long at all, 10 minutes    Diagnostic tests MRI: IMPRESSION:  1. Moderate tendinopathy and a longitudinal  split type tear  involving the peroneus brevis tendon. Mild tenosynovitis involving  both peroneal tendons.  2. Distal tendinopathy involving the attachment fibers of the  posterior tibialis tendon.  3. Chronic appearing Achilles tendinopathy.  4. Suspect prior surgical changes or possibly prior tear of the  plantar fascia. No acute tear/rupture.  5. Moderate midfoot degenerative changes. No stress fracture or AVN.    Patient Stated Goals "I want to be able walk painlessly." "I have a trip to [place] in July and want to be able to do it pain free."    Currently in Pain? Yes    Pain Score 5     Pain Location Foot    Pain Orientation Left    Pain Descriptors / Indicators Aching    Pain Type Chronic pain    Pain Onset More than a month ago    Pain Frequency Intermittent    Aggravating Factors  standing and walking    Pain Relieving Factors ice and rest    Effect of Pain on Daily Activities pain limits her ability to stand and walk  [place] Adult PT Treatment/Exercise - 12/27/20 0001      Self-Care   Other Self-Care Comments  use of HEP and progressive strengthenin over time to improve her pain and stability; how to use self soft tissue mobilization      Manual Therapy   Manual Therapy Soft tissue mobilization    Manual therapy comments skilled palpation of trigger points    Soft tissue mobilization to plantar fascia, posterior tibialis, peroneals and gastroc    Passive ROM light passive stretching of the calf      Ankle Exercises: Stretches   Other Stretch demonstrated how to stretch the calf without putting torque on the plantar facai      Ankle Exercises: Supine   Other Supine Ankle Exercises ankle pF 2x10 yellow; ankle inversion x5 with band x10 without band      Ankle Exercises: Standing   Other Standing Ankle Exercises standing weight shfit formward in pain free range x10; standing march with UE support                  PT  Education - 12/27/20 2059    Education Details reviewed final HEP    Person(s) Educated Patient    Methods Explanation;Demonstration;Tactile cues;Verbal cues    Comprehension Verbalized understanding;Returned demonstration;Verbal cues required;Tactile cues required            PT Short Term Goals - 12/13/20 1252      PT SHORT TERM GOAL #1   Title Patient will be independent and compliant with established HEP.    Baseline issued at eval.    Time 3    Period Weeks    Status Achieved    Target Date 12/15/20      PT SHORT TERM GOAL #2   Title patient will demonstrate at least 10 degrees of Lt ankle DF AROM to improve gait mechanics.    Baseline 5    Time 4    Period Weeks    Status Achieved    Target Date 12/22/20      PT SHORT TERM GOAL #3   Title patient will demonstrate at least 40 degrees of Lt great toe passive extension to improve toe-off during terminal stance/preswing on the LLE.    Baseline see flowsheet    Time 4    Period Weeks    Status Achieved    Target Date 12/22/20             PT Long Term Goals - 11/24/20 1106      PT LONG TERM GOAL #1   Title Patient will score at least 58% function on FOTO to signify clinically meaningful improvement in functional abilities.    Baseline 43%    Time 8    Period Weeks    Status New    Target Date 01/19/21      PT LONG TERM GOAL #2   Title Patient will demonstrate 5/5 bilateral hip strength to improve stability about the chain for prolonged walking.    Baseline see flowsheet    Time 8    Period Weeks    Status New    Target Date 01/19/21      PT LONG TERM GOAL #3   Title Patient will demonstrate SLS on the LLE for at least 10 seconds to improve stability when walking on uneven terrain.    Baseline <5 seconds    Time 8    Period Weeks    Status New    Target Date 01/19/21  PT LONG TERM GOAL #4   Title Patient will self-report tolerating at least 20 minutes of walking activity with less than 5/10 pain.     Baseline 10 minutes, 10/10 pain    Time 8    Period Weeks    Status New    Target Date 01/19/21                 Plan - 12/27/20 2104    Clinical Impression Statement Patient has not resposned to manual therapy, taping, or exercise at this point. There is still potential long term that light exercise may be beneficial. She will continue with her HEP at home. therapy reviewed soft tissue mobilization with the pateint. She has tried the doraal night splint but was unable to wear it. She will return to MD for further follow up.    Personal Factors and Comorbidities Age;Time since onset of injury/illness/exacerbation;Other    Examination-Activity Limitations Carry;Lift;Locomotion Level;Stairs;Stand    Examination-Participation Restrictions Cleaning;Meal Prep;Shop    Stability/Clinical Decision Making Stable/Uncomplicated    Clinical Decision Making Low    Rehab Potential Fair    PT Frequency 2x / week    PT Duration 8 weeks    PT Treatment/Interventions ADLs/Self Care Home Management;Cryotherapy;Electrical Stimulation;Iontophoresis 50m/ml Dexamethasone;Moist Heat;Ultrasound;Gait training;Therapeutic activities;Therapeutic exercise;Balance training;Neuromuscular re-education;Patient/family education;Manual techniques;Passive range of motion;Dry needling;Taping;Vasopneumatic Device    PT Next Visit Plan assess response to tape. manual therapy/TPDN posterior tibialis, plantar fascia, foot intrinsic strengthening    PT Home Exercise Plan [id number]    Consulted and Agree with Plan of Care Patient           Patient will benefit from skilled therapeutic intervention in order to improve the following deficits and impairments:  Abnormal gait,Decreased range of motion,Difficulty walking,Decreased endurance,Decreased activity tolerance,Pain,Decreased balance,Impaired flexibility,Decreased strength  Visit Diagnosis: Pain in left foot  Stiffness of left ankle, not elsewhere  classified  Muscle weakness (generalized)  Difficulty in walking, not elsewhere classified     Problem List Patient Active Problem List   Diagnosis Date Noted  . Asthma   . 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 Major depressive disorder, recurrent, severe without psychotic features ([place]   . 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 Suicide attempt by drug ingestion ([place]   . Medication management 08/22/2014  . Vitamin D deficiency 08/22/2014  . Hyperlipidemia, mixed 08/22/2014  . Hypertension   . Anxiety   . Abnormal glucose   . Class 2 severe obesity due to excess calories with serious comorbidity and body mass index (BMI) of 37.0 to 37.9 in adult ([facility]    PHYSICAL THERAPY DISCHARGE SUMMARY  Visits from Start of Care: 8  Current functional level related to goals / functional outcomes: Sent back to MD   Remaining deficits: Continued high levels of pain with all activity   Education / Equipment: HEP   Plan: Patient agrees to discharge.  Patient goals were not met. Patient is being discharged due to lack of progress.  ?????      DCarney LivingPT DPT  12/27/2020, 9:17 PM  [facility] [place] [address]Phone: [phone]  Fax:  [phone] Name: Haley White

## 2020-12-27 NOTE — Patient Instructions (Signed)
Access Code: [email or web address]: [email or web address]: 02/23/2022Prepared by: Gigi Gin Notes  put light pressure into the plantar facia  Put light pressure into tight painful spots int he calf  Exercises  Seated Ankle Inversion with Resistance - 3 x daily - 7 x weekly - 1 sets - 10 reps  Ankle Plantar Flexion with Resistance - 3 x daily - 7 x weekly - 1 sets - 10 reps  Long Sitting Calf Stretch with Strap - 3 x daily - 7 x weekly - 1 sets - 3 reps - 20 hold  Forward Backward Weight Shift with Counter Support - 1 x daily - 7 x weekly - 1 sets - 10 reps  Standing March with Counter Support - 1 x daily - 7 x weekly - 1 sets - 10 reps

## 2020-12-29 ENCOUNTER — Ambulatory Visit: Payer: 59 | Admitting: Physical Therapy

## 2021-01-02 ENCOUNTER — Ambulatory Visit (INDEPENDENT_AMBULATORY_CARE_PROVIDER_SITE_OTHER): Payer: 59 | Admitting: Podiatry

## 2021-01-02 ENCOUNTER — Encounter: Payer: Self-pay | Admitting: Podiatry

## 2021-01-02 ENCOUNTER — Other Ambulatory Visit: Payer: Self-pay

## 2021-01-02 DIAGNOSIS — M722 Plantar fascial fibromatosis: Secondary | ICD-10-CM

## 2021-01-02 DIAGNOSIS — M76829 Posterior tibial tendinitis, unspecified leg: Secondary | ICD-10-CM

## 2021-01-02 DIAGNOSIS — M779 Enthesopathy, unspecified: Secondary | ICD-10-CM | POA: Diagnosis not present

## 2021-01-02 NOTE — Patient Instructions (Signed)

## 2021-01-03 ENCOUNTER — Ambulatory Visit: Payer: 59 | Admitting: Physical Therapy

## 2021-01-03 NOTE — Progress Notes (Signed)
Subjective: 65 year old female presents the office today for follow evaluation of left foot and ankle pain.  She said last saw her she states her pain is actually worsened.  Last month saw her she felt encouraged that she was making progress with physical therapy but she states this is since plateaued and she is no longer making any progress.  She wants to discuss surgical options at this point.  She describes pain mostly in the arch and the heel of her foot but also the sides of her ankle. Denies any systemic complaints such as fevers, chills, nausea, vomiting. No acute changes since last appointment, and no other complaints at this time.   Objective: AAO x3, NAD DP/PT pulses palpable bilaterally, CRT less than 3 seconds There is control patient reports the posterior tibial tendon, peroneal tendon as well as to the plantar aspect the calcaneus along the arch of the foot as well on the plantar fascial.  Equinus is present.  Mild edema there is no erythema or warmth.  Dorsal spurring present of midfoot causing discomfort.  Decreased medial arch upon weightbearing.  MMT 5/5. No pain with calf compression, swelling, warmth, erythema  Assessment: 65 year old female with chronic ankle pain, tendon tear, plan fasciitis with equinus  Plan: -All treatment options discussed with the patient including all alternatives, risks, complications.  -I reviewed the previous MRI with her.  It does show moderate tendinopathy with longitudinal split tear of the peroneal's brevis as well as distal tendinopathy of the posterior tibial tendon as well as plantar fasciitis.  I discussed surgical versus conservative treatment.  She was pursuing surgical invention.  She is aware this may not completely resolve her discomfort and postoperatively she still need to have orthotics and physical therapy. -Discussed with her peroneal tendon repair, posterior tibial tendon repair, PRP injection for the heel as well as gastrocnemius  recession.  Also she had to have a bone spur removed on top of her foot given the discomfort. -The incision placement as well as the postoperative course was discussed with the patient. I discussed risks of the surgery which include, but not limited to, infection, bleeding, pain, swelling, need for further surgery, delayed or nonhealing, painful or ugly scar, numbness or sensation changes, over/under correction, recurrence, transfer lesions, further deformity, DVT/PE, loss of toe/foot. Patient understands these risks and wishes to proceed with surgery. The surgical consent was reviewed with the patient all 3 pages were signed. No promises or guarantees were given to the outcome of the procedure. All questions were answered to the best of my ability. Before the surgery the patient was encouraged to call the office if there is any further questions. The surgery will be performed at the [facility] on an outpatient basis. -Patient encouraged to call the office with any questions, concerns, change in symptoms.   Trula Slade DPM

## 2021-01-05 ENCOUNTER — Ambulatory Visit: Payer: 59 | Admitting: Physical Therapy

## 2021-01-09 ENCOUNTER — Encounter: Payer: Self-pay | Admitting: Internal Medicine

## 2021-01-09 NOTE — Patient Instructions (Signed)
Due to recent changes in healthcare laws, you may see the results of your imaging and laboratory studies on MyChart before your provider has had a chance to review them.  We understand that in some cases there may be results that are confusing or concerning to you. Not all laboratory results come back in the same time frame and the provider may be waiting for multiple results in order to interpret others.  Please give [place] 48 hours in order for your provider to thoroughly review all the results before contacting the office for clarification of your results.   ++++++++++++++++++++++++++++++  Vit D  & Vit C 1,000 mg   are recommended to help protect  against the Covid-19 and other Corona viruses.    Also it's recommended  to take  Zinc 50 mg  to help  protect against the Covid-19   and best place to get  is also on [email or web address]  and don't pay more than 6-8 cents /pill !  ================================ Coronavirus (COVID-19) Are you at risk?  Are you at risk for the Coronavirus (COVID-19)?  To be considered HIGH RISK for Coronavirus (COVID-19), you have to meet the following criteria:  . Traveled to [place], [place], [place], [place] or [place]; or in the [place] to [place], [place], [place]  . or [place]; and have fever, cough, and shortness of breath within the last 2 weeks of travel OR . Been in close contact with a person diagnosed with COVID-19 within the last 2 weeks and have  . fever, cough,and shortness of breath .  . IF YOU DO NOT MEET THESE CRITERIA, YOU ARE CONSIDERED LOW RISK FOR COVID-19.  What to do if you are HIGH RISK for COVID-19?  Marland Kitchen If you are having a medical emergency, call 911. . Seek medical care right away. Before you go to a doctor's office, urgent care or emergency department, .  call ahead and tell them about your recent travel, contact with someone diagnosed with COVID-19  .  and your symptoms.  . You should receive instructions from your  physician's office regarding next steps of care.  . When you arrive at healthcare provider, tell the healthcare staff immediately you have returned from  . visiting [place], [place], [place], [place] or [place]; or traveled in the [place] to [place], [place],  . [place] or [place] in the last two weeks or you have been in close contact with a person diagnosed with  . COVID-19 in the last 2 weeks.   . Tell the health care staff about your symptoms: fever, cough and shortness of breath. . After you have been seen by a medical provider, you will be either: o Tested for (COVID-19) and discharged home on quarantine except to seek medical care if  o symptoms worsen, and asked to  - Stay home and avoid contact with others until you get your results (4-5 days)  - Avoid travel on public transportation if possible (such as bus, train, or airplane) or o Sent to the Emergency Department by EMS for evaluation, COVID-19 testing  and  o possible admission depending on your condition and test results.  What to do if you are LOW RISK for COVID-19?  Reduce your risk of any infection by using the same precautions used for avoiding the common cold or flu:  Marland Kitchen Wash your hands often with soap and warm water for at least 20 seconds.  If soap and water are not readily  available,  . use an alcohol-based hand sanitizer with at least 60% alcohol.  . If coughing or sneezing, cover your mouth and nose by coughing or sneezing into the elbow areas of your shirt or coat, .  into a tissue or into your sleeve (not your hands). . Avoid shaking hands with others and consider head nods or verbal greetings only. . Avoid touching your eyes, nose, or mouth with unwashed hands.  . Avoid close contact with people who are sick. . Avoid places or events with large numbers of people in one location, like concerts or sporting events. . Carefully consider travel plans you have or are making. . If you are planning any travel  outside or inside the [place], visit the CDC's Travelers' Health webpage for the latest health notices. . If you have some symptoms but not all symptoms, continue to monitor at home and seek medical attention  . if your symptoms worsen. . If you are having a medical emergency, call 911. >>>>>>>>>>>>>>>>>>>>>>> Preventive Care for Adults  A healthy lifestyle and preventive care can promote health and wellness. Preventive health guidelines for women include the following key practices.  A routine yearly physical is a good way to check with your health care provider about your health and preventive screening. It is a chance to share any concerns and updates on your health and to receive a thorough exam.  Visit your dentist for a routine exam and preventive care every 6 months. Brush your teeth twice a day and floss once a day. Good oral hygiene prevents tooth decay and gum disease.  The frequency of eye exams is based on your age, health, family medical history, use of contact lenses, and other factors. Follow your health care provider's recommendations for frequency of eye exams.  Eat a healthy diet. Foods like vegetables, fruits, whole grains, low-fat dairy products, and lean protein foods contain the nutrients you need without too many calories. Decrease your intake of foods high in solid fats, added sugars, and salt. Eat the right amount of calories for you. Get information about a proper diet from your health care provider, if necessary.  Regular physical exercise is one of the most important things you can do for your health. Most adults should get at least 150 minutes of moderate-intensity exercise (any activity that increases your heart rate and causes you to sweat) each week. In addition, most adults need muscle-strengthening exercises on 2 or more days a week.  Maintain a healthy weight. The body mass index (BMI) is a screening tool to identify possible weight problems. It provides an estimate  of body fat based on height and weight. Your health care provider can find your BMI and can help you achieve or maintain a healthy weight. For adults 20 years and older:  A BMI below 18.5 is considered underweight.  A BMI of 18.5 to 24.9 is normal.  A BMI of 25 to 29.9 is considered overweight.  A BMI of 30 and above is considered obese.  Maintain normal blood lipids and cholesterol levels by exercising and minimizing your intake of saturated fat. Eat a balanced diet with plenty of fruit and vegetables. Blood tests for lipids and cholesterol should begin at age 80 and be repeated every 5 years. If your lipid or cholesterol levels are high, you are over 50, or you are at high risk for heart disease, you may need your cholesterol levels checked more frequently. Ongoing high lipid and cholesterol levels should be treated with  medicines if diet and exercise are not working.  If you smoke, find out from your health care provider how to quit. If you do not use tobacco, do not start.  Lung cancer screening is recommended for adults aged 37-80 years who are at high risk for developing lung cancer because of a history of smoking. A yearly low-dose CT scan of the lungs is recommended for people who have at least a 30-pack-year history of smoking and are a current smoker or have quit within the past 15 years. A pack year of smoking is smoking an average of 1 pack of cigarettes a day for 1 year (for example: 1 pack a day for 30 years or 2 packs a day for 15 years). Yearly screening should continue until the smoker has stopped smoking for at least 15 years. Yearly screening should be stopped for people who develop a health problem that would prevent them from having lung cancer treatment.  High blood pressure causes heart disease and increases the risk of stroke. Your blood pressure should be checked at least every 1 to 2 years. Ongoing high blood pressure should be treated with medicines if weight loss and  exercise do not work.  If you are 38-36 years old, ask your health care provider if you should take aspirin to prevent strokes.  Diabetes screening involves taking a blood sample to check your fasting blood sugar level. This should be done once every 3 years, after age 17, if you are within normal weight and without risk factors for diabetes. Testing should be considered at a younger age or be carried out more frequently if you are overweight and have at least 1 risk factor for diabetes.  Breast cancer screening is essential preventive care for women. You should practice "breast self-awareness." This means understanding the normal appearance and feel of your breasts and may include breast self-examination. Any changes detected, no matter how small, should be reported to a health care provider. Women in their 81s and 30s should have a clinical breast exam (CBE) by a health care provider as part of a regular health exam every 1 to 3 years. After age 62, women should have a CBE every year. Starting at age 86, women should consider having a mammogram (breast X-ray test) every year. Women who have a family history of breast cancer should talk to their health care provider about genetic screening. Women at a high risk of breast cancer should talk to their health care providers about having an MRI and a mammogram every year.  Breast cancer gene (BRCA)-related cancer risk assessment is recommended for women who have family members with BRCA-related cancers. BRCA-related cancers include breast, ovarian, tubal, and peritoneal cancers. Having family members with these cancers may be associated with an increased risk for harmful changes (mutations) in the breast cancer genes BRCA1 and BRCA2. Results of the assessment will determine the need for genetic counseling and BRCA1 and BRCA2 testing.  Routine pelvic exams to screen for cancer are no longer recommended for nonpregnant women who are considered low risk for  cancer of the pelvic organs (ovaries, uterus, and vagina) and who do not have symptoms. Ask your health care provider if a screening pelvic exam is right for you.  If you have had past treatment for cervical cancer or a condition that could lead to cancer, you need Pap tests and screening for cancer for at least 20 years after your treatment. If Pap tests have been discontinued, your risk factors (such  as having a new sexual partner) need to be reassessed to determine if screening should be resumed. Some women have medical problems that increase the chance of getting cervical cancer. In these cases, your health care provider may recommend more frequent screening and Pap tests.  Colorectal cancer can be detected and often prevented. Most routine colorectal cancer screening begins at the age of 60 years and continues through age 35 years. However, your health care provider may recommend screening at an earlier age if you have risk factors for colon cancer. On a yearly basis, your health care provider may provide home test kits to check for hidden blood in the stool. Use of a small camera at the end of a tube, to directly examine the colon (sigmoidoscopy or colonoscopy), can detect the earliest forms of colorectal cancer. Talk to your health care provider about this at age 24, when routine screening begins.  Direct exam of the colon should be repeated every 5-10 years through age 75 years, unless early forms of pre-cancerous polyps or small growths are found.  Hepatitis C blood testing is recommended for all people born from [birth year] through 1965 and any individual with known risks for hepatitis C.  Pra  Osteoporosis is a disease in which the bones lose minerals and strength with aging. This can result in serious bone fractures or breaks. The risk of osteoporosis can be identified using a bone density scan. Women ages 68 years and over and women at risk for fractures or osteoporosis should discuss screening with  their health care providers. Ask your health care provider whether you should take a calcium supplement or vitamin D to reduce the rate of osteoporosis.  Menopause can be associated with physical symptoms and risks. Hormone replacement therapy is available to decrease symptoms and risks. You should talk to your health care provider about whether hormone replacement therapy is right for you.  Use sunscreen. Apply sunscreen liberally and repeatedly throughout the day. You should seek shade when your shadow is shorter than you. Protect yourself by wearing long sleeves, pants, a wide-brimmed hat, and sunglasses year round, whenever you are outdoors.  Once a month, do a whole body skin exam, using a mirror to look at the skin on your back. Tell your health care provider of new moles, moles that have irregular borders, moles that are larger than a pencil eraser, or moles that have changed in shape or color.  Stay current with required vaccines (immunizations).  Influenza vaccine. All adults should be immunized every year.  Tetanus, diphtheria, and acellular pertussis (Td, Tdap) vaccine. Pregnant women should receive 1 dose of Tdap vaccine during each pregnancy. The dose should be obtained regardless of the length of time since the last dose. Immunization is preferred during the 27th-36th week of gestation. An adult who has not previously received Tdap or who does not know her vaccine status should receive 1 dose of Tdap. This initial dose should be followed by tetanus and diphtheria toxoids (Td) booster doses every 10 years. Adults with an unknown or incomplete history of completing a 3-dose immunization series with Td-containing vaccines should begin or complete a primary immunization series including a Tdap dose. Adults should receive a Td booster every 10 years.  Varicella vaccine. An adult without evidence of immunity to varicella should receive 2 doses or a second dose if she has previously received 1  dose. Pregnant females who do not have evidence of immunity should receive the first dose after pregnancy. This first dose  should be obtained before leaving the health care facility. The second dose should be obtained 4-8 weeks after the first dose.  Human papillomavirus (HPV) vaccine. Females aged 13-26 years who have not received the vaccine previously should obtain the 3-dose series. The vaccine is not recommended for use in pregnant females. However, pregnancy testing is not needed before receiving a dose. If a female is found to be pregnant after receiving a dose, no treatment is needed. In that case, the remaining doses should be delayed until after the pregnancy. Immunization is recommended for any person with an immunocompromised condition through the age of 32 years if she did not get any or all doses earlier. During the 3-dose series, the second dose should be obtained 4-8 weeks after the first dose. The third dose should be obtained 24 weeks after the first dose and 16 weeks after the second dose.  Zoster vaccine. One dose is recommended for adults aged 4 years or older unless certain conditions are present.  Measles, mumps, and rubella (MMR) vaccine. Adults born before [birth year] generally are considered immune to measles and mumps. Adults born in [birth year] or later should have 1 or more doses of MMR vaccine unless there is a contraindication to the vaccine or there is laboratory evidence of immunity to each of the three diseases. A routine second dose of MMR vaccine should be obtained at least 28 days after the first dose for students attending postsecondary schools, health care workers, or international travelers. People who received inactivated measles vaccine or an unknown type of measles vaccine during 1963-1967 should receive 2 doses of MMR vaccine. People who received inactivated mumps vaccine or an unknown type of mumps vaccine before 1979 and are at high risk for mumps infection should consider  immunization with 2 doses of MMR vaccine. For females of childbearing age, rubella immunity should be determined. If there is no evidence of immunity, females who are not pregnant should be vaccinated. If there is no evidence of immunity, females who are pregnant should delay immunization until after pregnancy. Unvaccinated health care workers born before [birth year] who lack laboratory evidence of measles, mumps, or rubella immunity or laboratory confirmation of disease should consider measles and mumps immunization with 2 doses of MMR vaccine or rubella immunization with 1 dose of MMR vaccine.  Pneumococcal 13-valent conjugate (PCV13) vaccine. When indicated, a person who is uncertain of her immunization history and has no record of immunization should receive the PCV13 vaccine. An adult aged 77 years or older who has certain medical conditions and has not been previously immunized should receive 1 dose of PCV13 vaccine. This PCV13 should be followed with a dose of pneumococcal polysaccharide (PPSV23) vaccine. The PPSV23 vaccine dose should be obtained at least 1 or more year(s) after the dose of PCV13 vaccine. An adult aged 29 years or older who has certain medical conditions and previously received 1 or more doses of PPSV23 vaccine should receive 1 dose of PCV13. The PCV13 vaccine dose should be obtained 1 or more years after the last PPSV23 vaccine dose.    Pneumococcal polysaccharide (PPSV23) vaccine. When PCV13 is also indicated, PCV13 should be obtained first. All adults aged 65 years and older should be immunized. An adult younger than age 35 years who has certain medical conditions should be immunized. Any person who resides in a nursing home or long-term care facility should be immunized. An adult smoker should be immunized. People with an immunocompromised condition and certain other conditions should receive both  PCV13 and PPSV23 vaccines. People with human immunodeficiency virus (HIV) infection should  be immunized as soon as possible after diagnosis. Immunization during chemotherapy or radiation therapy should be avoided. Routine use of PPSV23 vaccine is not recommended for American Indians, [place] Natives, or people younger than 65 years unless there are medical conditions that require PPSV23 vaccine. When indicated, people who have unknown immunization and have no record of immunization should receive PPSV23 vaccine. One-time revaccination 5 years after the first dose of PPSV23 is recommended for people aged 19-64 years who have chronic kidney failure, nephrotic syndrome, asplenia, or immunocompromised conditions. People who received 1-2 doses of PPSV23 before age 19 years should receive another dose of PPSV23 vaccine at age 58 years or later if at least 5 years have passed since the previous dose. Doses of PPSV23 are not needed for people immunized with PPSV23 at or after age 106 years.  Preventive Services / Frequency   Ages 28 to 7 years  Blood pressure check.  Lipid and cholesterol check.  Lung cancer screening. / Every year if you are aged 67-80 years and have a 30-pack-year history of smoking and currently smoke or have quit within the past 15 years. Yearly screening is stopped once you have quit smoking for at least 15 years or develop a health problem that would prevent you from having lung cancer treatment.  Clinical breast exam.** / Every year after age 76 years.   BRCA-related cancer risk assessment.** / For women who have family members with a BRCA-related cancer (breast, ovarian, tubal, or peritoneal cancers).  Mammogram.** / Every year beginning at age 64 years and continuing for as long as you are in good health. Consult with your health care provider.  Pap test.** / Every 3 years starting at age 85 years through age 23 or 29 years with a history of 3 consecutive normal Pap tests.  HPV screening.** / Every 3 years from ages 28 years through ages 18 to 42 years with a history  of 3 consecutive normal Pap tests.  Fecal occult blood test (FOBT) of stool. / Every year beginning at age 44 years and continuing until age 44 years. You may not need to do this test if you get a colonoscopy every 10 years.  Flexible sigmoidoscopy or colonoscopy.** / Every 5 years for a flexible sigmoidoscopy or every 10 years for a colonoscopy beginning at age 79 years and continuing until age 76 years.  Hepatitis C blood test.** / For all people born from [birth year] through 1964 and any individual with known risks for hepatitis C.  Skin self-exam. / Monthly.  Influenza vaccine. / Every year.  Tetanus, diphtheria, and acellular pertussis (Tdap/Td) vaccine.** / Consult your health care provider. Pregnant women should receive 1 dose of Tdap vaccine during each pregnancy. 1 dose of Td every 10 years.  Varicella vaccine.** / Consult your health care provider. Pregnant females who do not have evidence of immunity should receive the first dose after pregnancy.  Zoster vaccine.** / 1 dose for adults aged 32 years or older.  Pneumococcal 13-valent conjugate (PCV13) vaccine.** / Consult your health care provider.  Pneumococcal polysaccharide (PPSV23) vaccine.** / 1 to 2 doses if you smoke cigarettes or if you have certain conditions.  Meningococcal vaccine.** / Consult your health care provider.  Hepatitis A vaccine.** / Consult your health care provider.  Hepatitis B vaccine.** / Consult your health care provider. Screening for abdominal aortic aneurysm (AAA)  by ultrasound is recommended for people over 50  who have history of high blood pressure or who are current or former smokers. ++++++++++++++++++ Recommend Adult Low Dose Aspirin or  coated  Aspirin 81 mg daily  To reduce risk of Colon Cancer 40 %,  Skin Cancer 26 % ,  Melanoma 46%  and  Pancreatic cancer 60% +++++++++++++++++++ Vitamin D goal  is between 70-100.  Please make sure that you are taking your Vitamin D as directed.  It  is very important as a natural anti-inflammatory  helping hair, skin, and nails, as well as reducing stroke and heart attack risk.  It helps your bones and helps with mood. It also decreases numerous cancer risks so please take it as directed.  Low Vit D is associated with a 200-300% higher risk for CANCER  and 200-300% higher risk for HEART   ATTACK  &  STROKE.   .....................................Marland Kitchen It is also associated with higher death rate at younger ages,  autoimmune diseases like Rheumatoid arthritis, Lupus, Multiple Sclerosis.    Also many other serious conditions, like depression, Alzheimer's Dementia, infertility, muscle aches, fatigue, fibromyalgia - just to name a few. ++++++++++++++++++ Recommend the book "The END of DIETING" by Dr Excell Seltzer  & the book "The END of DIABETES " by Dr Excell Seltzer At [email or web address] - get book & Audio CD's    Being diabetic has a  300% increased risk for heart attack, stroke, cancer, and alzheimer- type vascular dementia. It is very important that you work harder with diet by avoiding all foods that are white. Avoid white rice (brown & wild rice is OK), white potatoes (sweetpotatoes in moderation is OK), White bread or wheat bread or anything made out of white flour like bagels, donuts, rolls, buns, biscuits, cakes, pastries, cookies, pizza crust, and pasta (made from white flour & egg whites) - vegetarian pasta or spinach or wheat pasta is OK. Multigrain breads like Arnold's or [employer], or multigrain sandwich thins or flatbreads.  Diet, exercise and weight loss can reverse and cure diabetes in the early stages.  Diet, exercise and weight loss is very important in the control and prevention of complications of diabetes which affects every system in your body, ie. Brain - dementia/stroke, eyes - glaucoma/blindness, heart - heart attack/heart failure, kidneys - dialysis, stomach - gastric paralysis, intestines - malabsorption, nerves - severe painful  neuritis, circulation - gangrene & loss of a leg(s), and finally cancer and Alzheimers.    I recommend avoid fried & greasy foods,  sweets/candy, white rice (brown or wild rice or Quinoa is OK), white potatoes (sweet potatoes are OK) - anything made from white flour - bagels, doughnuts, rolls, buns, biscuits,white and wheat breads, pizza crust and traditional pasta made of white flour & egg white(vegetarian pasta or spinach or wheat pasta is OK).  Multi-grain bread is OK - like multi-grain flat bread or sandwich thins. Avoid alcohol in excess. Exercise is also important.    Eat all the vegetables you want - avoid meat, especially red meat and dairy - especially cheese.  Cheese is the most concentrated form of trans-fats which is the worst thing to clog up our arteries. Veggie cheese is OK which can be found in the fresh produce section at [employer] or [employer] or [employer]  ++++++++++++++++++++++ DASH Eating Plan  DASH stands for "Dietary Approaches to Stop Hypertension."   The DASH eating plan is a healthy eating plan that has been shown to reduce high blood pressure (hypertension). Additional health benefits may include reducing the  risk of type 2 diabetes mellitus, heart disease, and stroke. The DASH eating plan may also help with weight loss. WHAT DO I NEED TO KNOW ABOUT THE DASH EATING PLAN? For the DASH eating plan, you will follow these general guidelines:  Choose foods with a percent daily value for sodium of less than 5% (as listed on the food label).  Use salt-free seasonings or herbs instead of table salt or sea salt.  Check with your health care provider or pharmacist before using salt substitutes.  Eat lower-sodium products, often labeled as "lower sodium" or "no salt added."  Eat fresh foods.  Eat more vegetables, fruits, and low-fat dairy products.  Choose whole grains. Look for the word "whole" as the first word in the ingredient list.  Choose fish   Limit  sweets, desserts, sugars, and sugary drinks.  Choose heart-healthy fats.  Eat veggie cheese   Eat more home-cooked food and less restaurant, buffet, and fast food.  Limit fried foods.  Cook foods using methods other than frying.  Limit canned vegetables. If you do use them, rinse them well to decrease the sodium.  When eating at a restaurant, ask that your food be prepared with less salt, or no salt if possible.                      WHAT FOODS CAN I EAT? Read Dr Fara Olden Fuhrman's books on The End of Dieting & The End of Diabetes  Grains Whole grain or whole wheat bread. Brown rice. Whole grain or whole wheat pasta. Quinoa, bulgur, and whole grain cereals. Low-sodium cereals. Corn or whole wheat flour tortillas. Whole grain cornbread. Whole grain crackers. Low-sodium crackers.  Vegetables Fresh or frozen vegetables (raw, steamed, roasted, or grilled). Low-sodium or reduced-sodium tomato and vegetable juices. Low-sodium or reduced-sodium tomato sauce and paste. Low-sodium or reduced-sodium canned vegetables.   Fruits All fresh, canned (in natural juice), or frozen fruits.  Protein Products  All fish and seafood.  Dried beans, peas, or lentils. Unsalted nuts and seeds. Unsalted canned beans.  Dairy Low-fat dairy products, such as skim or 1% milk, 2% or reduced-fat cheeses, low-fat ricotta or cottage cheese, or plain low-fat yogurt. Low-sodium or reduced-sodium cheeses.  Fats and Oils Tub margarines without trans fats. Light or reduced-fat mayonnaise and salad dressings (reduced sodium). Avocado. Safflower, olive, or canola oils. Natural peanut or almond butter.  Other Unsalted popcorn and pretzels. The items listed above may not be a complete list of recommended foods or beverages. Contact your dietitian for more options.  ++++++++++++++++++  WHAT FOODS ARE NOT RECOMMENDED? Grains/ White flour or wheat flour White bread. White pasta. White rice. Refined cornbread. Bagels and  croissants. Crackers that contain trans fat.  Vegetables  Creamed or fried vegetables. Vegetables in a . Regular canned vegetables. Regular canned tomato sauce and paste. Regular tomato and vegetable juices.  Fruits Dried fruits. Canned fruit in light or heavy syrup. Fruit juice.  Meat and Other Protein Products Meat in general - RED meat & White meat.  Fatty cuts of meat. Ribs, chicken wings, all processed meats as bacon, sausage, bologna, salami, fatback, hot dogs, bratwurst and packaged luncheon meats.  Dairy Whole or 2% milk, cream, half-and-half, and cream cheese. Whole-fat or sweetened yogurt. Full-fat cheeses or blue cheese. Non-dairy creamers and whipped toppings. Processed cheese, cheese spreads, or cheese curds.  Condiments Onion and garlic salt, seasoned salt, table salt, and sea salt. Canned and packaged gravies. Worcestershire sauce. Tartar sauce.  Barbecue sauce. Teriyaki sauce. Soy sauce, including reduced sodium. Steak sauce. Fish sauce. Oyster sauce. Cocktail sauce. Horseradish. Ketchup and mustard. Meat flavorings and tenderizers. Bouillon cubes. Hot sauce. Tabasco sauce. Marinades. Taco seasonings. Relishes.  Fats and Oils Butter, stick margarine, lard, shortening and bacon fat. Coconut, palm kernel, or palm oils. Regular salad dressings.  Pickles and olives. Salted popcorn and pretzels.  The items listed above may not be a complete list of foods and beverages to avoid.

## 2021-01-09 NOTE — Progress Notes (Signed)
Annual Screening/Preventative Visit & Comprehensive Evaluation &  Examination      This very nice 65 y.o. [name] presents for a Screening /Preventative Visit & comprehensive evaluation and management of multiple medical co-morbidities.  Patient has been followed for HTN, HLD, Prediabetes  and Vitamin D Deficiency. Chest CT scan on 02/02/2018 showed Aortic Atherosclerosis.       Patient hasADD & Depression treated by Dr Chucky May  with Fluoxetine /Bupropion &Methylphenidate. Patient relates moderate difficulty with "Winter Blues" /Depression.       HTN predates since 2008. Patient's BP has been controlled at home and patient denies any cardiac symptoms as chest pain, palpitations, shortness of breath, dizziness or ankle swelling. Today's        Patient's hyperlipidemia is usually controlled with diet and medications. Patient denies myalgias or other medication SE's. Last lipids were not at goal and patient was re-started on her Rosuvastatin in Dec 2021:  Lab Results  Component Value Date   CHOL 232 (H) 09/05/2020   HDL 66 09/05/2020   LDLCALC 135 (H) 09/05/2020   TRIG 175 (H) 09/05/2020   CHOLHDL 3.5 09/05/2020       Patient has moderate obesity (BMI 37+) and hx/o prediabetes (5.9% /2014) and patient denies reactive hypoglycemic symptoms, visual blurring, diabetic polys or paresthesias. Last A1c was near goal:  Lab Results  Component Value Date   HGBA1C 5.8 (H) 06/01/2020       Finally, patient has history of Vitamin D Deficiency ("20" / 10) and last Vitamin D was at goal:  Lab Results  Component Value Date   VD25OH 78 06/01/2020    Current Outpatient Medications on File Prior to Visit  Medication Sig  . albuterol HFA  inhaler Inhale 2 puffs every 4  hrs as needed for wheezing   . VITAMIN C  Take 1 tablet   . B Complex Vitamins  Take 1 tablet daily.  . WELLBUTRIN XL 300 MG  Take every morning.  Marland Kitchen CALCIUM 600  Take  daily.  . cyclobenzaprine (10 MG tablet Take 1  tablet 3 times daily as needed for muscle spasms.  Marland Kitchen DIFLUCAN 150 MG tablet Take one tab - onset of sx's and   2sd tablet 3 days later  . FLUoxetine 20 MG capsule Take 1 capsule  daily  . FLONASE nasal spray Use  1 TO 2 SPRAYS  QD  . levocetirizine  5 MG tablet Take 1 tablet Daily   . lisinopril 20 MG tablet TAKE 1 TABLET  DAILY  . Magnesium 400 MG TABS Take 1 tablet  daily.  . Meloxicam 7.5 MG tablet Take 1 tablet  daily.  . methylphenidate  10 MG  TK 1 tab BID  . montelukast  10 MG tablet Take 1 daily.  . naproxen  500 MG tablet Take 1 tablet 2  times daily as needed for mild pain  . ondansetron 8 MG tablet Take  1 tablet every 6 hours as needed for Nausea  . rosuvastatin ( 40 MG tablet TAKE 1 TABLET DAILY   . SYMBICORT 160-4.5  inhaler INL 2 PFS PO BID  . topiramate 50 MG tablet Take 1 tablet 2 x  /day at Suppertime & Bedtime   . VITAMIN D 10,000 Units Take  daily.    Allergies  Allergen Reactions  . Molds & Smuts Anaphylaxis  . Biaxin [Clarithromycin]     GI upset  . Serevent [Salmeterol]     Palpitations  . Tequin [Gatifloxacin]  GI upset. thrush    Past Medical History:  Diagnosis Date  . Anxiety   . Arthritis   . Asthma   . Cataract   . Gout   . Hyperlipidemia   . Hypertension   . Obese     Health Maintenance  Topic Date Due  . Hepatitis C Screening  Never done  . INFLUENZA VACCINE  06/04/2020  . COVID-19 Vaccine (3 - Booster for Pfizer series) 09/27/2020  . MAMMOGRAM  01/13/2022  . PAP SMEAR-Modifier  11/28/2022  . COLONOSCOPY  12/26/2022  . TETANUS/TDAP  03/28/2030  . HIV Screening  Completed  . HPV VACCINES  Aged Out    Immunization History  Administered Date(s) Administered  . Influenza,inj,Quad PF,6+ Mos 09/17/2017  . Influenza 08/20/2018  . PFIZERSARS-COV-2 Vacc 01/20/2020, 02/10/2020  . PPD Test 10/08/2017, 11/29/2019  . Pneumococcal Conjugate-13 11/05/1995  . Pneumococcal Polysaccharide-23 10/08/2017  . Td 11/29/2019  . Tdap 11/04/2008,  03/28/2020    Last Colon - 12/27/2019 - Dr Silverio Decamp - Recommended 3 year f/u due Mar 2024  Last MGM - 01/15/2020  Last BMD - 02/25/2020 - Osteopenia (T-2.0)   Past Surgical History:  Procedure Laterality Date  . APPENDECTOMY  1984  . CATARACT EXTRACTION W/ INTRAOCULAR LENS  IMPLANT, BILATERAL  2014  . [place]   X3  . COLONOSCOPY  11/16/2013   w/Brodie   . FOOT SURGERY Left 2002  . KNEE SURGERY Left 1998  . ROTATOR CUFF REPAIR Right 2007  . TUBAL LIGATION  1994    Family History  Problem Relation Age of Onset  . Dementia Father   . Anxiety disorder Sister   . Colon cancer Neg Hx   . Colon polyps Neg Hx   . Esophageal cancer Neg Hx   . Stomach cancer Neg Hx   . Rectal cancer Neg Hx     Social History   Tobacco Use  . Smoking status: Former Smoker    Packs/day: 1.00    Years: 7.00    Pack years: 7.00    Types: Cigarettes    Quit date: 11/04/1977    Years since quitting: 43.2  . Smokeless tobacco: Never Used  Substance Use Topics  . Alcohol use: Yes    Alcohol/week: 4.0 standard drinks    Types: 4 Glasses of wine per week  . Drug use: No    ROS Constitutional: Denies fever, chills, weight loss/gain, headaches, insomnia,  night sweats, and change in appetite. Does c/o fatigue. Eyes: Denies redness, blurred vision, diplopia, discharge, itchy, watery eyes.  ENT: Denies discharge, congestion, post nasal drip, epistaxis, sore throat, earache, hearing loss, dental pain, Tinnitus, Vertigo, Sinus pain, snoring.  Cardio: Denies chest pain, palpitations, irregular heartbeat, syncope, dyspnea, diaphoresis, orthopnea, PND, claudication, edema Respiratory: denies cough, dyspnea, DOE, pleurisy, hoarseness, laryngitis, wheezing.  Gastrointestinal: Denies dysphagia, heartburn, reflux, water brash, pain, cramps, nausea, vomiting, bloating, diarrhea, constipation, hematemesis, melena, hematochezia, jaundice, hemorrhoids Genitourinary: Denies dysuria,  frequency, urgency, nocturia, hesitancy, discharge, hematuria, flank pain Breast: Breast lumps, nipple discharge, bleeding.  Musculoskeletal: Denies arthralgia, myalgia, stiffness, Jt. Swelling, pain, limp, and strain/sprain. Denies falls. Skin: Denies puritis, rash, hives, warts, acne, eczema, changing in skin lesion Neuro: No weakness, tremor, incoordination, spasms, paresthesia, pain Psychiatric: Denies confusion, memory loss, sensory loss. Denies Depression. Endocrine: Denies change in weight, skin, hair change, nocturia, and paresthesia, diabetic polys, visual blurring, hyper / hypo glycemic episodes.  Heme/Lymph: No excessive bleeding, bruising, enlarged lymph nodes.  Physical Exam  LMP  11/26/2011   General Appearance: Over nourished and in no apparent distress.  Eyes: PERRLA, EOMs, conjunctiva no swelling or erythema, normal fundi and vessels. Sinuses: No frontal/maxillary tenderness ENT/Mouth: EACs patent / TMs  nl. Nares clear without erythema, swelling, mucoid exudates. Oral hygiene is good. No erythema, swelling, or exudate. Tongue normal, non-obstructing. Tonsils not swollen or erythematous. Hearing normal.  Neck: Supple, thyroid not palpable. No bruits, nodes or JVD. Respiratory: Respiratory effort normal.  BS equal and clear bilateral without rales, rhonci, wheezing or stridor. Cardio: Heart sounds are normal with regular rate and rhythm and no murmurs, rubs or gallops. Peripheral pulses are normal and equal bilaterally without edema. No aortic or femoral bruits. Chest: symmetric with normal excursions and percussion. Breasts: Symmetric, without lumps, nipple discharge, retractions, or fibrocystic changes.  Abdomen: Flat, soft with bowel sounds active. Nontender, no guarding, rebound, hernias, masses, or organomegaly.  Lymphatics: Non tender without lymphadenopathy.  Genitourinary:  Musculoskeletal: Full ROM all peripheral extremities, joint stability, 5/5 strength, and normal  gait. Skin: Warm and dry without rashes, lesions, cyanosis, clubbing or  ecchymosis.  Neuro: Cranial nerves intact, reflexes equal bilaterally. Normal muscle tone, no cerebellar symptoms. Sensation intact.  Pysch: Alert and oriented X 3, normal affect, Insight and Judgment appropriate.   Assessment and Plan  1. Annual Preventative Screening Examination   2. Essential hypertension  - EKG 12-Lead - [place], RETROPERITNL ABD,  LTD - Urinalysis, Routine w reflex microscopic - Microalbumin / creatinine urine ratio - CBC with Differential/Platelet - COMPLETE METABOLIC PANEL WITH GFR - Magnesium - TSH  3. Hyperlipidemia, mixed  - EKG 12-Lead - [place], RETROPERITNL ABD,  LTD - Lipid panel - TSH  4. Abnormal glucose  - EKG 12-Lead - [place], RETROPERITNL ABD,  LTD - Hemoglobin A1c - Insulin, random  5. Vitamin D deficiency  - VITAMIN D 25 Hydroxy   6. Class 2 severe obesity due to excess calories with serious comorbidity  and body mass index (BMI) of 37.0 to 37.9 in adult (HCC)  - TSH  7. Major depressive disorder, recurrent, severe without psychotic features ([place])  - TSH  8. Screening-pulmonary TB  - TB Skin Test  9. Screening for colorectal cancer  - POC Hemoccult Bld/Stl  10. Screening for ischemic heart disease  - EKG 12-Lead  11. FH: hypertension  - EKG 12-Lead - [place], RETROPERITNL ABD,  LTD  12. Former smoker  - EKG 12-Lead - [place], RETROPERITNL ABD,  LTD  13. Screening for AAA (aortic abdominal aneurysm)  - [place], RETROPERITNL ABD,  LTD  14. Fatigue  - Iron,Total/Total Iron Binding Cap - Vitamin B12 - CBC with Differential/Platelet - TSH  15. Medication management  - Urinalysis, Routine w reflex microscopic - Microalbumin / creatinine urine ratio - CBC with Differential/Platelet - COMPLETE METABOLIC PANEL WITH GFR - Magnesium - Lipid panel - TSH - Hemoglobin A1c - Insulin, random - VITAMIN D 25 Hydroxyl             Patient was counseled in  prudent diet to achieve/maintain BMI less than 25 for weight control, BP monitoring, regular exercise and medications. Discussed med's effects and SE's. Screening labs and tests as requested with regular follow-up as recommended. Over 40 minutes of exam, counseling, chart review and high complex critical decision making was performed.   Kirtland Bouchard, MD

## 2021-01-10 ENCOUNTER — Ambulatory Visit (INDEPENDENT_AMBULATORY_CARE_PROVIDER_SITE_OTHER): Payer: 59 | Admitting: Internal Medicine

## 2021-01-10 ENCOUNTER — Other Ambulatory Visit: Payer: Self-pay

## 2021-01-10 VITALS — BP 122/84 | HR 65 | Temp 97.9°F | Resp 16 | Ht 63.0 in | Wt 215.4 lb

## 2021-01-10 DIAGNOSIS — E559 Vitamin D deficiency, unspecified: Secondary | ICD-10-CM | POA: Diagnosis not present

## 2021-01-10 DIAGNOSIS — Z111 Encounter for screening for respiratory tuberculosis: Secondary | ICD-10-CM

## 2021-01-10 DIAGNOSIS — E782 Mixed hyperlipidemia: Secondary | ICD-10-CM

## 2021-01-10 DIAGNOSIS — Z87891 Personal history of nicotine dependence: Secondary | ICD-10-CM

## 2021-01-10 DIAGNOSIS — Z0001 Encounter for general adult medical examination with abnormal findings: Secondary | ICD-10-CM

## 2021-01-10 DIAGNOSIS — Z8249 Family history of ischemic heart disease and other diseases of the circulatory system: Secondary | ICD-10-CM

## 2021-01-10 DIAGNOSIS — E66812 Obesity, class 2: Secondary | ICD-10-CM

## 2021-01-10 DIAGNOSIS — R7309 Other abnormal glucose: Secondary | ICD-10-CM

## 2021-01-10 DIAGNOSIS — F332 Major depressive disorder, recurrent severe without psychotic features: Secondary | ICD-10-CM

## 2021-01-10 DIAGNOSIS — Z13220 Encounter for screening for lipoid disorders: Secondary | ICD-10-CM

## 2021-01-10 DIAGNOSIS — Z136 Encounter for screening for cardiovascular disorders: Secondary | ICD-10-CM

## 2021-01-10 DIAGNOSIS — Z6837 Body mass index (BMI) 37.0-37.9, adult: Secondary | ICD-10-CM

## 2021-01-10 DIAGNOSIS — Z79899 Other long term (current) drug therapy: Secondary | ICD-10-CM

## 2021-01-10 DIAGNOSIS — Z0000 Encounter for general adult medical examination without abnormal findings: Secondary | ICD-10-CM | POA: Diagnosis not present

## 2021-01-10 DIAGNOSIS — Z131 Encounter for screening for diabetes mellitus: Secondary | ICD-10-CM

## 2021-01-10 DIAGNOSIS — Z130 Encounter for screening for diseases of the blood and blood-forming organs and certain disorders involving the immune mechanism: Secondary | ICD-10-CM

## 2021-01-10 DIAGNOSIS — R5383 Other fatigue: Secondary | ICD-10-CM

## 2021-01-10 DIAGNOSIS — Z1389 Encounter for screening for other disorder: Secondary | ICD-10-CM | POA: Diagnosis not present

## 2021-01-10 DIAGNOSIS — I700 Atherosclerosis of aorta: Secondary | ICD-10-CM

## 2021-01-10 DIAGNOSIS — I10 Essential (primary) hypertension: Secondary | ICD-10-CM

## 2021-01-10 DIAGNOSIS — Z1211 Encounter for screening for malignant neoplasm of colon: Secondary | ICD-10-CM

## 2021-01-10 NOTE — Progress Notes (Signed)
AortaScan < 3 cm. Within normal limits, per Dr McKeown. 

## 2021-01-11 LAB — URINALYSIS, ROUTINE W REFLEX MICROSCOPIC
Bilirubin Urine: NEGATIVE
Glucose, UA: NEGATIVE
Hgb urine dipstick: NEGATIVE
Ketones, ur: NEGATIVE
Leukocytes,Ua: NEGATIVE
Nitrite: NEGATIVE
Protein, ur: NEGATIVE
Specific Gravity, Urine: 1.026 (ref 1.001–1.03)
pH: 5.5 (ref 5.0–8.0)

## 2021-01-11 LAB — COMPLETE METABOLIC PANEL WITH GFR
AG Ratio: 2.4 (calc) (ref 1.0–2.5)
ALT: 23 U/L (ref 6–29)
AST: 18 U/L (ref 10–35)
Albumin: 4.6 g/dL (ref 3.6–5.1)
Alkaline phosphatase (APISO): 60 U/L (ref 37–153)
BUN/Creatinine Ratio: 19 (calc) (ref 6–22)
BUN: 20 mg/dL (ref 7–25)
CO2: 23 mmol/L (ref 20–32)
Calcium: 9.2 mg/dL (ref 8.6–10.4)
Chloride: 108 mmol/L (ref 98–110)
Creat: 1.05 mg/dL — ABNORMAL HIGH (ref 0.50–0.99)
GFR, Est African American: 65 mL/min/{1.73_m2} (ref >=60)
GFR, Est Non African American: 56 mL/min/{1.73_m2} — ABNORMAL LOW (ref >=60)
Globulin: 1.9 g/dL (calc) (ref 1.9–3.7)
Glucose, Bld: 115 mg/dL — ABNORMAL HIGH (ref 65–99)
Potassium: 4.2 mmol/L (ref 3.5–5.3)
Sodium: 143 mmol/L (ref 135–146)
Total Bilirubin: 0.7 mg/dL (ref 0.2–1.2)
Total Protein: 6.5 g/dL (ref 6.1–8.1)

## 2021-01-11 LAB — CBC WITH DIFFERENTIAL/PLATELET
Absolute Monocytes: 501 cells/uL (ref 200–950)
Basophils Absolute: 52 cells/uL (ref 0–200)
Basophils Relative: 0.8 %
Eosinophils Absolute: 611 cells/uL — ABNORMAL HIGH (ref 15–500)
Eosinophils Relative: 9.4 %
HCT: 40.8 % (ref 35.0–45.0)
Hemoglobin: 13.4 g/dL (ref 11.7–15.5)
Lymphs Abs: 2594 cells/uL (ref 850–3900)
MCH: 31.2 pg (ref 27.0–33.0)
MCHC: 32.8 g/dL (ref 32.0–36.0)
MCV: 94.9 fL (ref 80.0–100.0)
MPV: 10.4 fL (ref 7.5–12.5)
Monocytes Relative: 7.7 %
Neutro Abs: 2743 cells/uL (ref 1500–7800)
Neutrophils Relative %: 42.2 %
Platelets: 291 10*3/uL (ref 140–400)
RBC: 4.3 10*6/uL (ref 3.80–5.10)
RDW: 11.6 % (ref 11.0–15.0)
Total Lymphocyte: 39.9 %
WBC: 6.5 10*3/uL (ref 3.8–10.8)

## 2021-01-11 LAB — HEMOGLOBIN A1C
Hgb A1c MFr Bld: 5.4 % of total Hgb (ref <5.7)
Mean Plasma Glucose: 108 mg/dL
eAG (mmol/L): 6 mmol/L

## 2021-01-11 LAB — LIPID PANEL
Cholesterol: 177 mg/dL (ref <200)
HDL: 79 mg/dL (ref >=50)
LDL Cholesterol (Calc): 79 mg/dL (calc)
Non-HDL Cholesterol (Calc): 98 mg/dL (calc) (ref <130)
Total CHOL/HDL Ratio: 2.2 (calc) (ref <5.0)
Triglycerides: 108 mg/dL (ref <150)

## 2021-01-11 LAB — IRON, TOTAL/TOTAL IRON BINDING CAP
%SAT: 38 % (calc) (ref 16–45)
Iron: 101 ug/dL (ref 45–160)
TIBC: 267 mcg/dL (calc) (ref 250–450)

## 2021-01-11 LAB — MICROALBUMIN / CREATININE URINE RATIO
Creatinine, Urine: 239 mg/dL (ref 20–275)
Microalb Creat Ratio: 3 mcg/mg creat (ref <30)
Microalb, Ur: 0.7 mg/dL

## 2021-01-11 LAB — VITAMIN B12: Vitamin B-12: 511 pg/mL (ref 200–1100)

## 2021-01-11 LAB — VITAMIN D 25 HYDROXY (VIT D DEFICIENCY, FRACTURES): Vit D, 25-Hydroxy: 100 ng/mL (ref 30–100)

## 2021-01-11 LAB — MAGNESIUM: Magnesium: 2 mg/dL (ref 1.5–2.5)

## 2021-01-11 LAB — INSULIN, RANDOM: Insulin: 30 u[IU]/mL — ABNORMAL HIGH

## 2021-01-11 LAB — TSH: TSH: 1.37 mIU/L (ref 0.40–4.50)

## 2021-01-11 NOTE — Progress Notes (Signed)
============================================================ -   Test results slightly outside the reference range are not unusual. If there is anything important, I will review this with you,  otherwise it is considered normal test values.  If you have further questions,  please do not hesitate to contact me at the office or via My Chart.  ============================================================ ============================================================  -  Iron levels & Vitamin B12 levels - both Normal & OK  ============================================================ ============================================================  -  Total Chol = 177 - Much better  (was 232)  - and   - LDL Chol = 79 - also Great (was 166)  - both  Excellent   - Very low risk for Heart Attack  / Stroke ============================================================ ============================================================  - A1c = 5.4% - Excellent - back in Normal NonDiabetic range  ============================================================ ============================================================  -  Vitamin D = 100 -  Excellent ============================================================ ============================================================  -  All Else - CBC - Kidneys - U/A - Electrolytes - Liver - Magnesium & Thyroid    - all  Normal / OK ============================================================ ============================================================  - Keep up the [place] Work  ! ============================================================ ============================================================

## 2021-01-15 ENCOUNTER — Encounter: Payer: Self-pay | Admitting: Podiatry

## 2021-01-16 ENCOUNTER — Other Ambulatory Visit: Payer: Self-pay | Admitting: Podiatry

## 2021-01-16 ENCOUNTER — Ambulatory Visit: Payer: 59 | Admitting: Podiatry

## 2021-01-16 MED ORDER — TRAMADOL HCL 50 MG PO TABS: 50.0000 mg | ORAL_TABLET | Freq: Two times a day (BID) | ORAL | 0 refills | Status: AC | PRN

## 2021-01-22 ENCOUNTER — Other Ambulatory Visit: Payer: Self-pay | Admitting: Adult Health

## 2021-01-23 ENCOUNTER — Telehealth: Payer: Self-pay | Admitting: *Deleted

## 2021-01-23 NOTE — Telephone Encounter (Signed)
Patient is wanting to know if she should stop taking Naproxen 1 week before upcoming surgery,has read all instructions for pre surgery.Please advise. She is also requesting a handicap sticker, will bring paper work in to be completed by physician.for post surgery and would like to know how much she is going to owe from this surgery.

## 2021-01-23 NOTE — Telephone Encounter (Signed)
Yes, she should stop NSAIDs a week prior.

## 2021-01-23 NOTE — Telephone Encounter (Signed)
Called and explained to patient per Dr Jacqualyn Posey that she is to stop taking Nsaids 1 week prior to her surgery and that we will complete her for a handicap sticker..she verbalized understanding.

## 2021-01-23 NOTE — Telephone Encounter (Signed)
I called and notified Jalesa that she will need to stop taking the Naproxen 1 week prior to surgery. I also informed her that Jocelyn Lamer in our insurance department will call her about a quote and that we can take care of filling out the handicap form.

## 2021-01-25 ENCOUNTER — Telehealth: Payer: Self-pay

## 2021-01-25 NOTE — Telephone Encounter (Signed)
DOS 01/31/2021  REPAIR POST TIBIAL & PERONEAL TENDON X2 LT - [id number] GASTROCNEMIUS RECESS LT - [id number] TARSAL EXOSTECTOMY LT - [id number] PRP LT - [address] FROM [place] - AUTH # [id number] APPROVED FOR CPT [phone], [phone] & [phone]. AUTH GOOD FOR 01/31/2021 ONLY.  CPT 0232T, NOT APPROVED. SPOKE TO CYNTHIA AT [place]

## 2021-01-31 ENCOUNTER — Other Ambulatory Visit: Payer: Self-pay | Admitting: Podiatry

## 2021-01-31 ENCOUNTER — Encounter (HOSPITAL_COMMUNITY): Payer: Self-pay | Admitting: Emergency Medicine

## 2021-01-31 ENCOUNTER — Other Ambulatory Visit: Payer: Self-pay

## 2021-01-31 ENCOUNTER — Encounter: Payer: Self-pay | Admitting: Podiatry

## 2021-01-31 ENCOUNTER — Telehealth: Payer: Self-pay | Admitting: *Deleted

## 2021-01-31 ENCOUNTER — Emergency Department (HOSPITAL_COMMUNITY)
Admission: EM | Admit: 2021-01-31 | Discharge: 2021-01-31 | Disposition: A | Discharge status: Home / Self Care | Payer: 59 | Attending: Emergency Medicine | Admitting: Emergency Medicine

## 2021-01-31 DIAGNOSIS — M7752 Other enthesopathy of left foot: Secondary | ICD-10-CM

## 2021-01-31 DIAGNOSIS — J45909 Unspecified asthma, uncomplicated: Secondary | ICD-10-CM | POA: Diagnosis not present

## 2021-01-31 DIAGNOSIS — I10 Essential (primary) hypertension: Secondary | ICD-10-CM | POA: Insufficient documentation

## 2021-01-31 DIAGNOSIS — M76822 Posterior tibial tendinitis, left leg: Secondary | ICD-10-CM

## 2021-01-31 DIAGNOSIS — Z79899 Other long term (current) drug therapy: Secondary | ICD-10-CM | POA: Insufficient documentation

## 2021-01-31 DIAGNOSIS — Z87891 Personal history of nicotine dependence: Secondary | ICD-10-CM | POA: Diagnosis not present

## 2021-01-31 DIAGNOSIS — Z7951 Long term (current) use of inhaled steroids: Secondary | ICD-10-CM | POA: Insufficient documentation

## 2021-01-31 DIAGNOSIS — S0501XA Injury of conjunctiva and corneal abrasion without foreign body, right eye, initial encounter: Secondary | ICD-10-CM | POA: Insufficient documentation

## 2021-01-31 DIAGNOSIS — X58XXXA Exposure to other specified factors, initial encounter: Secondary | ICD-10-CM | POA: Diagnosis not present

## 2021-01-31 DIAGNOSIS — S0591XA Unspecified injury of right eye and orbit, initial encounter: Secondary | ICD-10-CM | POA: Diagnosis present

## 2021-01-31 DIAGNOSIS — M722 Plantar fascial fibromatosis: Secondary | ICD-10-CM

## 2021-01-31 MED ORDER — ONDANSETRON HCL 4 MG PO TABS: 4.0000 mg | ORAL_TABLET | Freq: Three times a day (TID) | ORAL | 0 refills | Status: DC | PRN

## 2021-01-31 MED ORDER — FENTANYL CITRATE (PF) 100 MCG/2ML IJ SOLN
INTRAMUSCULAR | Status: AC
  Filled 2021-01-31: qty 2

## 2021-01-31 MED ORDER — CEPHALEXIN 500 MG PO CAPS: 500.0000 mg | ORAL_CAPSULE | Freq: Three times a day (TID) | ORAL | 0 refills | Status: DC

## 2021-01-31 MED ORDER — FENTANYL CITRATE (PF) 100 MCG/2ML IJ SOLN
50.0000 ug | Freq: Once | INTRAMUSCULAR | Status: AC
Start: 2021-01-31 — End: 2021-01-31

## 2021-01-31 MED ORDER — FLUORESCEIN SODIUM 1 MG OP STRP
ORAL_STRIP | OPHTHALMIC | Status: AC
  Administered 2021-01-31: 1 via OPHTHALMIC
  Filled 2021-01-31: qty 1

## 2021-01-31 MED ORDER — OXYCODONE-ACETAMINOPHEN 10-325 MG PO TABS: 1.0000 | ORAL_TABLET | Freq: Four times a day (QID) | ORAL | 0 refills | Status: DC | PRN

## 2021-01-31 MED ORDER — FLUORESCEIN SODIUM 1 MG OP STRP
1.0000 | ORAL_STRIP | Freq: Once | OPHTHALMIC | Status: AC
  Filled 2021-01-31: qty 1

## 2021-01-31 MED ORDER — ERYTHROMYCIN 5 MG/GM OP OINT: TOPICAL_OINTMENT | OPHTHALMIC | 0 refills | Status: DC

## 2021-01-31 MED ORDER — TETRACAINE HCL 0.5 % OP SOLN
2.0000 [drp] | Freq: Once | OPHTHALMIC | Status: AC
  Administered 2021-01-31: 2 [drp] via OPHTHALMIC
  Filled 2021-01-31: qty 4

## 2021-01-31 NOTE — Progress Notes (Signed)
Postop medications sent 

## 2021-01-31 NOTE — Telephone Encounter (Signed)
Called and gave patient's son the number to the [facility] surgical center per doctor,stated that it may be coming from anesthesia during surgery.

## 2021-01-31 NOTE — ED Provider Notes (Signed)
[place] EMERGENCY DEPARTMENT Provider Note   CSN: [id number] Arrival date & time: 01/31/21  1639     History Chief Complaint  Patient presents with  . Eye Pain    Haley White is a 65 y.o. female presents to ER by EMS for evaluation of sudden onset right eye pain. She had left foot surgery today at outpatient surgical center. Woke up from surgery and noticed immediately right eye pain. Described as "sand in it" discomfort, constant, moderate to severe.  Worse with blinking and opening her eye. Received fentyl en route and reports some improvement. Associated with blurred eye vision, tearing, redness. States staff at surgical center gave her eye drops but only temporary relief. She doesn't know the name of the drops she was given. No associated headache. No pain with eye movements if she keeps her eyelids closed, pain with opened eye. Does not wear contacts or glasses. No other associated symptoms. Was told that they had taped her eyes closed during general anesthesia during surgery today.   HPI     Past Medical History:  Diagnosis Date  . Anxiety   . Arthritis   . Asthma   . Cataract   . Gout   . Hyperlipidemia   . Hypertension   . Obese     Patient Active Problem List   Diagnosis Date Noted  . Aortic atherosclerosis ([place]) by Chest CT scan on 02/02/2018  01/10/2021  . Asthma   . Low back pain 03/17/2019  . Lumbosacral spondylosis without myelopathy 03/17/2019  . Cubital tunnel syndrome 10/14/2018  . Closed fracture of distal end of right radius 07/09/2018  . 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 Major depressive disorder, recurrent, severe without psychotic features ([place])   . 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 Suicide attempt by drug ingestion ([place])   . Medication management 08/22/2014  . Vitamin D deficiency 08/22/2014  . Hyperlipidemia, mixed 08/22/2014  . Hypertension   . Anxiety   . Abnormal glucose   . Class 2 severe obesity due to excess calories  with serious comorbidity and body mass index (BMI) of 37.0 to 37.9 in adult [facility])     Past Surgical History:  Procedure Laterality Date  . APPENDECTOMY  1984  . CATARACT EXTRACTION W/ INTRAOCULAR LENS  IMPLANT, BILATERAL  2014  . [place]   X3  . COLONOSCOPY  11/16/2013   w/[place]   . FOOT SURGERY Left 2002  . KNEE SURGERY Left 1998  . ROTATOR CUFF REPAIR Right 2007  . TUBAL LIGATION  1994     OB History   No obstetric history on file.     Family History  Problem Relation Age of Onset  . Dementia Father   . Anxiety disorder Sister   . Colon cancer Neg Hx   . Colon polyps Neg Hx   . Esophageal cancer Neg Hx   . Stomach cancer Neg Hx   . Rectal cancer Neg Hx     Social History   Tobacco Use  . Smoking status: Former Smoker    Packs/day: 1.00    Years: 7.00    Pack years: 7.00    Types: Cigarettes    Quit date: 11/04/1977    Years since quitting: 43.2  . Smokeless tobacco: Never Used  Substance Use Topics  . Alcohol use: Yes    Alcohol/week: 4.0 standard drinks    Types: 4 Glasses of wine per week  . Drug use: No  Home Medications Prior to Admission medications   Medication Sig Start Date End Date Taking? Authorizing Provider  erythromycin ophthalmic ointment Place a 1/2 inch ribbon of ointment into the lower eyelid every 6 hours during the day 01/31/21  Yes Donney Rankins, Vergie Zahm J, PA-C  albuterol (VENTOLIN HFA) 108 (90 Base) MCG/ACT inhaler Inhale 2 puffs into the lungs every 4 (four) hours as needed for wheezing or shortness of breath. 07/17/17   Liane Comber, NP  Ascorbic Acid (VITAMIN C PO) Take 1 tablet by mouth.    [provider]  B Complex Vitamins (VITAMIN B-COMPLEX PO) Take 1 tablet by mouth daily.    [provider]  buPROPion (WELLBUTRIN XL) 300 MG 24 hr tablet Take 300 mg by mouth every morning. 12/17/20   [provider]  Calcium Carbonate (CALCIUM 600 PO) Take by mouth daily.    [provider]  cephALEXin (KEFLEX) 500 MG capsule Take 1 capsule (500 mg total) by mouth 3 (three) times daily. 01/31/21   Trula Slade, DPM  cyclobenzaprine (FLEXERIL) 10 MG tablet Take 1 tablet (10 mg total) by mouth 3 (three) times daily as needed for muscle spasms. 03/07/19   Street, Mercedes, PA-C  FLUoxetine (PROZAC) 20 MG capsule Take 1 capsule (20 mg total) by mouth daily. For depression 05/08/15   Lindell Spar I, NP  fluticasone (FLONASE) 50 MCG/ACT nasal spray SHAKE LQ AND U 1 TO 2 SPRAYS IEN QD 10/17/17   [provider]  levocetirizine (XYZAL) 5 MG tablet TK 1 T PO QD IN THE EVE 12/07/17   [provider]  lisinopril (ZESTRIL) 20 MG tablet TAKE 1 TABLET BY MOUTH DAILY FOR BLOOD PRESSURE 11/23/20   Liane Comber, NP  Magnesium 400 MG TABS Take 1 tablet by mouth daily.    [provider]  methylphenidate (RITALIN) 10 MG tablet TK 1 T PO BID 01/16/18   [provider]  montelukast (SINGULAIR) 10 MG tablet Take 10 mg by mouth daily. 12/23/20   [provider]  naproxen (NAPROSYN) 500 MG tablet Take 1 tablet (500 mg total) by mouth 2 (two) times daily as needed for mild pain, moderate pain or headache (TAKE WITH MEALS.). 03/07/19   Street, Mercedes, PA-C  ondansetron (ZOFRAN) 4 MG tablet Take 1 tablet (4 mg total) by mouth every 8 (eight) hours as needed for nausea or vomiting. 01/31/21   Trula Slade, DPM  oxyCODONE-acetaminophen (PERCOCET) 10-325 MG tablet Take 1 tablet by mouth every 6 (six) hours as needed for pain. MAXIMUM TOTAL ACETAMINOPHEN DOSE IS 4000 MG PER DAY 01/31/21   Trula Slade, DPM  rosuvastatin (CRESTOR) 40 MG tablet TAKE 1 TABLET BY MOUTH DAILY FOR CHOLESTEROL 01/22/21   Liane Comber, NP  SYMBICORT 160-4.5 MCG/ACT inhaler INL 2 PFS PO BID 01/16/18   [provider]  topiramate (TOPAMAX) 50 MG tablet Take 1 tablet 2 x  /day at Suppertime & Bedtime for Dieting & Weight Loss 08/12/20   Unk Pinto, MD  VITAMIN D  PO Take 15,000 Units by mouth daily.    [provider]    Allergies    Molds & smuts, Biaxin [clarithromycin], Serevent [salmeterol], and Tequin [gatifloxacin]  Review of Systems   Review of Systems  Eyes: Positive for photophobia, pain, discharge, redness and visual disturbance.  All other systems reviewed and are negative.   Physical Exam Updated Vital Signs BP 132/90 (BP Location: Right Arm)   Pulse 86   Temp 98.2 F (36.8 C) (Oral)  Resp 16   LMP 11/26/2011   SpO2 100%   Physical Exam Constitutional:      Appearance: She is well-developed.  HENT:     Head: Normocephalic.     Comments: No temporal tenderness     Nose: Nose normal.  Eyes:     General: Lids are normal.     Intraocular pressure: Right eye pressure is 18 mmHg. Measurements were taken using a handheld tonometer.    Pupils:     Right eye: Fluorescein uptake present.     Comments: RIGHT EYE: +Direct photosensitivity. PERRL. No consensual photosensitivity. EOMs intact, pain with looking up.  No pain with EOMs as long as eyes are closed. Upper/lower lids without erythema, edema, tenderness, warmth, palpable mass, lesions.  Conjunctiva and sclera white without prominent vessels.  IOP = 18. Fluorescein uptake = linear, horizontal uptake measuring approx 0.5 cm directly over lower aspect of iris. Visual acuity = can read large print on my badge at arm's length, unable to see smaller print (right eye), normal vision on the left .  Cardiovascular:     Rate and Rhythm: Normal rate.  Pulmonary:     Effort: Pulmonary effort is normal. No respiratory distress.  Musculoskeletal:        General: Normal range of motion.     Cervical back: Normal range of motion.  Neurological:     Mental Status: She is alert.  Psychiatric:        Behavior: Behavior normal.     ED Results / Procedures / Treatments   Labs (all labs ordered are listed, but only abnormal results are displayed) Labs Reviewed - No data to  display  EKG None  Radiology No results found.  Procedures Procedures   Medications Ordered in ED Medications  fentaNYL (SUBLIMAZE) injection 50 mcg ( Intravenous Given 01/31/21 1714)  tetracaine (PONTOCAINE) 0.5 % ophthalmic solution 2 drop (2 drops Right Eye Given 01/31/21 1908)  fluorescein ophthalmic strip 1 strip (1 strip Right Eye Given 01/31/21 1909)    ED Course  I have reviewed the triage vital signs and the nursing notes.  Pertinent labs & imaging results that were available during my care of the patient were reviewed by me and considered in my medical decision making (see chart for details).    MDM Rules/Calculators/A&P                          65 year old female presents to the ED for sudden onset right eye pain described as "sandy" immediately after she woke up from general anesthesia for her left foot surgery earlier today.  Has fluorescein uptake on exam today with normal IOP.  Reports classic symptoms of corneal abrasion including blurred vision, tearing, direct photosensitivity.  She was told that her eyelids were taped closed during general anesthesia.  No signs of infection, periorbital or orbital cellulitis.  IOP normal not consistent with glaucoma.  No trauma.  Doubt other more sinister etiology like a vascular retinal artery process, stroke.  Will discharge with erythromycin ophthalmic ointment and ophthalmology follow-up.  Return precautions discussed.  Final Clinical Impression(s) / ED Diagnoses Final diagnoses:  Abrasion of right cornea, initial encounter    Rx / DC Orders ED Discharge Orders         Ordered    erythromycin ophthalmic ointment        01/31/21 1936           Kinnie Feil, [place] 01/31/21 1943  Tegeler, Gwenyth Allegra, MD 02/01/21 0000

## 2021-01-31 NOTE — Telephone Encounter (Signed)
Patient's son is calling with concerns about mother, her left eye is really hurting her after waking up from surgery today. She was given some type of numbing medication but is continues to hurt and is red and she is  constantly rubbing, feels like something is in it.Please advise.

## 2021-01-31 NOTE — ED Triage Notes (Signed)
Pt BIB [facility], pt had surgery this morning to left ankle, when pt woke up after surgery, c/o right eye pain. Pt given drops, reports pain is worsening. Denies changes to vision.

## 2021-01-31 NOTE — Discharge Instructions (Signed)
You have a small abrasion in right eye  Use antibiotic eye ointment every 6 hours  Protect eye from sun/light  Symptoms should improve in 72hours  Call ophthalmologist Dr Prudencio Burly (below) if symptoms are not better and request an ER follow up visit in 72 hours   Return for loss of vision, severe headache, nausea, vomiting, fever, redness, warmth, pus

## 2021-01-31 NOTE — ED Notes (Signed)
Charting clarification - pt given 67mcg fentanyl iv per standing orders for acute pain, pt did not receive 127mcg, she received 50 mcg

## 2021-02-01 ENCOUNTER — Encounter: Payer: Self-pay | Admitting: Podiatry

## 2021-02-02 ENCOUNTER — Other Ambulatory Visit: Payer: Self-pay | Admitting: Podiatry

## 2021-02-02 ENCOUNTER — Encounter: Payer: Self-pay | Admitting: Podiatry

## 2021-02-02 ENCOUNTER — Telehealth: Payer: Self-pay | Admitting: *Deleted

## 2021-02-02 DIAGNOSIS — M76829 Posterior tibial tendinitis, unspecified leg: Secondary | ICD-10-CM

## 2021-02-02 DIAGNOSIS — M779 Enthesopathy, unspecified: Secondary | ICD-10-CM

## 2021-02-02 MED ORDER — RIVAROXABAN 10 MG PO TABS: 10.0000 mg | ORAL_TABLET | Freq: Every day | ORAL | 0 refills | Status: DC

## 2021-02-02 NOTE — Telephone Encounter (Signed)
Called and spoke with the patient yesterday and stated that I was calling to see how the patient was doing after having surgery with Dr Jacqualyn Posey on 01-31-2021 and patient stated that she had to go to the Emergency Room the same day as the the foot surgery due to having pain in the right eye after waking up from the surgery and the surgical center gave some ointment and patient stated it felt like sand and patient stated that after going to the Emergency Room they gave an ointment and was feeling a little better and patient stated there was not any fever or chills and not any nausea and was icing and elevating and could wiggle her toes and the nerve block had not worn off yet and I stated to call the office if any concerns or questions. Lattie Haw

## 2021-02-02 NOTE — Progress Notes (Signed)
Sent rx for Xarelto per Dr. Jacqualyn Posey

## 2021-02-05 ENCOUNTER — Telehealth: Payer: Self-pay | Admitting: Podiatry

## 2021-02-05 ENCOUNTER — Ambulatory Visit (INDEPENDENT_AMBULATORY_CARE_PROVIDER_SITE_OTHER): Payer: 59

## 2021-02-05 ENCOUNTER — Other Ambulatory Visit: Payer: Self-pay

## 2021-02-05 ENCOUNTER — Ambulatory Visit (INDEPENDENT_AMBULATORY_CARE_PROVIDER_SITE_OTHER): Payer: 59 | Admitting: Podiatry

## 2021-02-05 DIAGNOSIS — M76829 Posterior tibial tendinitis, unspecified leg: Secondary | ICD-10-CM

## 2021-02-05 DIAGNOSIS — M779 Enthesopathy, unspecified: Secondary | ICD-10-CM

## 2021-02-05 DIAGNOSIS — Z9889 Other specified postprocedural states: Secondary | ICD-10-CM

## 2021-02-05 DIAGNOSIS — M722 Plantar fascial fibromatosis: Secondary | ICD-10-CM

## 2021-02-05 MED ORDER — OXYCODONE-ACETAMINOPHEN 10-325 MG PO TABS: 1.0000 | ORAL_TABLET | Freq: Four times a day (QID) | ORAL | 0 refills | Status: DC | PRN

## 2021-02-05 NOTE — Telephone Encounter (Signed)
[facility] called and stated they are not accepting [facility]. Please Advise

## 2021-02-05 NOTE — Telephone Encounter (Signed)
I am not aware of one. I checked with [facility] and they are not in network either.

## 2021-02-05 NOTE — Telephone Encounter (Signed)
Are there any other home health agencies that take [facility] for this Haley White pt?

## 2021-02-06 ENCOUNTER — Encounter: Payer: Self-pay | Admitting: Podiatry

## 2021-02-07 NOTE — Telephone Encounter (Signed)
Can you have someone take care of the PA for this?

## 2021-02-08 ENCOUNTER — Encounter: Payer: Self-pay | Admitting: Podiatry

## 2021-02-08 NOTE — Progress Notes (Signed)
  Subjective:  Patient ID: Haley White, female    DOB: 1956-04-09,  MRN: [id number]  Chief Complaint  Patient presents with  . Routine Post Op    (xray)POV #1 DOS 01/31/2021 LT FOOT REMOVAL OF BONE SPUR TOP OF FOOT, REPAIR OF POSTERIOR TIBIAL & PERONEAL TENDON, HEEL CORD LENGTHENING(GASTROC RECESSION) PRP INJECTIION/DR WAGONER PT    DOS: 01/31/2021 Procedure: Left foot exostectomy, PT tendon peroneal tendon repair, gastrocnemius recession and PRP injection  65 y.o. female returns for post-op check.  Feeling well minimal pain  Review of Systems: Negative except as noted in the HPI. Denies N/V/F/Ch.   Objective:  There were no vitals filed for this visit. There is no height or weight on file to calculate BMI. Constitutional Well developed. Well nourished.  Vascular Foot warm and well perfused. Capillary refill normal to all digits.   Neurologic Normal speech. Oriented to person, place, and time. Epicritic sensation to light touch grossly present bilaterally.  Dermatologic Skin healing well without signs of infection. Skin edges well coapted without signs of infection.  Orthopedic: Tenderness to palpation noted about the surgical site.   Radiographs: Interval resection of dorsal exostosis Assessment:   1. Bone spur   2. PTTD (posterior tibial tendon dysfunction)   3. Tendonitis   4. Plantar fasciitis   5. Post-operative state    Plan:  Patient was evaluated and treated and all questions answered.  S/p foot surgery left -Progressing as expected post-operatively. -XR: As above -WB Status: NWB in CAM boot -Sutures: We will remove next week. -Medications: Refill pain medication -Foot redressed.  Return in about 1 week (around 02/12/2021) for post op, possible suture removal .

## 2021-02-13 ENCOUNTER — Other Ambulatory Visit: Payer: Self-pay

## 2021-02-13 ENCOUNTER — Ambulatory Visit (INDEPENDENT_AMBULATORY_CARE_PROVIDER_SITE_OTHER): Payer: 59 | Admitting: Podiatry

## 2021-02-13 DIAGNOSIS — M722 Plantar fascial fibromatosis: Secondary | ICD-10-CM

## 2021-02-13 DIAGNOSIS — M779 Enthesopathy, unspecified: Secondary | ICD-10-CM

## 2021-02-13 DIAGNOSIS — M76829 Posterior tibial tendinitis, unspecified leg: Secondary | ICD-10-CM

## 2021-02-13 MED ORDER — RIVAROXABAN 10 MG PO TABS: 10.0000 mg | ORAL_TABLET | Freq: Every day | ORAL | 0 refills | Status: DC

## 2021-02-13 MED ORDER — ONDANSETRON HCL 4 MG PO TABS: 4.0000 mg | ORAL_TABLET | Freq: Three times a day (TID) | ORAL | 0 refills | Status: DC | PRN

## 2021-02-13 MED ORDER — OXYCODONE-ACETAMINOPHEN 10-325 MG PO TABS
1.0000 | ORAL_TABLET | Freq: Four times a day (QID) | ORAL | 0 refills | Status: DC | PRN
Start: 2021-02-13 — End: 2021-06-27

## 2021-02-18 NOTE — Progress Notes (Signed)
  Subjective:  Patient ID: Haley White, female    DOB: September 20, 1956,  MRN: [id number]  Chief Complaint  Patient presents with  . Routine Post Op    POV #2 DOS 01/31/2021 LT FOOT REMOVAL OF BONE SPUR TOP OF FOOT, REPAIR OF POSTERIOR TIBIAL & PERONEAL TENDON, HEEL CORD LENGTHENING(GASTROC RECESSION) PRP INJECTIION/DR WAGONER PT    DOS: 01/31/2021 Procedure: Left foot exostectomy, PT tendon peroneal tendon repair, gastrocnemius recession and PRP injection  65 y.o. female returns for post-op check.  Feeling well minimal pain  Review of Systems: Negative except as noted in the HPI. Denies N/V/F/Ch.   Objective:  There were no vitals filed for this visit. There is no height or weight on file to calculate BMI. Constitutional Well developed. Well nourished.  Vascular Foot warm and well perfused. Capillary refill normal to all digits.   Neurologic Normal speech. Oriented to person, place, and time. Epicritic sensation to light touch grossly present bilaterally.  Dermatologic Skin healing well without signs of infection. Skin edges well coapted without signs of infection.  Orthopedic: Tenderness to palpation noted about the surgical site.   Radiographs: Interval resection of dorsal exostosis Assessment:   1. PTTD (posterior tibial tendon dysfunction)   2. Bone spur   3. Plantar fasciitis    Plan:  Patient was evaluated and treated and all questions answered.  S/p foot surgery left -Progressing as expected post-operatively. -XR: As above -WB Status: NWB in CAM boot -Sutures: No sutures removed, some staples and sutures left intact that need further healing -Medications: I refilled her pain medication Xarelto and Zofran -Foot redressed.  Return in about 1 week (around 02/20/2021) for final suture & staple removal.

## 2021-02-19 ENCOUNTER — Encounter: Payer: Self-pay | Admitting: Physical Therapy

## 2021-02-20 ENCOUNTER — Ambulatory Visit (INDEPENDENT_AMBULATORY_CARE_PROVIDER_SITE_OTHER): Payer: 59 | Admitting: Podiatry

## 2021-02-20 ENCOUNTER — Encounter: Payer: Self-pay | Admitting: Podiatry

## 2021-02-20 ENCOUNTER — Other Ambulatory Visit: Payer: Self-pay

## 2021-02-20 DIAGNOSIS — M779 Enthesopathy, unspecified: Secondary | ICD-10-CM

## 2021-02-20 DIAGNOSIS — M722 Plantar fascial fibromatosis: Secondary | ICD-10-CM

## 2021-02-20 DIAGNOSIS — M76829 Posterior tibial tendinitis, unspecified leg: Secondary | ICD-10-CM

## 2021-02-21 ENCOUNTER — Other Ambulatory Visit: Payer: Self-pay | Admitting: Internal Medicine

## 2021-02-21 MED ORDER — TOPIRAMATE 50 MG PO TABS
ORAL_TABLET | ORAL | 3 refills | Status: DC
Start: 2021-02-21 — End: 2021-09-13

## 2021-02-26 NOTE — Progress Notes (Signed)
Subjective: Haley White is a 65 y.o. is seen today in office s/p left foot exostectomy, repair posterior tibial tendon, peroneal tendon with gastrocnemius recession preformed on 01/31/2021. They state their pain is improving.  She been nonweightbearing and wearing the cam boot.  Denies any systemic complaints such as fevers, chills, nausea, vomiting. No calf pain, chest pain, shortness of breath.   Objective: General: No acute distress, AAOx3  DP/PT pulses palpable 2/4, CRT < 3 sec to all digits.  Protective sensation intact. Motor function intact.  LEFT foot: Incision is well coapted without any evidence of dehiscence with sutures, staples intact. There is no surrounding erythema, ascending cellulitis, fluctuance, crepitus, malodor, drainage/purulence. There is mild edema around the surgical site. There is no significant pain along the surgical site.  No other areas of tenderness to bilateral lower extremities.  No other open lesions or pre-ulcerative lesions.  No pain with calf compression, swelling, warmth, erythema.   Assessment and Plan:  Status post left foot surgery, doing well with no complications   -Treatment options discussed including all alternatives, risks, and complications -Today I remove the remainder the sutures and staples.  The incision remained well coapted.  Antibiotic ointment and bandage was applied.  She can start to shower and wash the foot with soap and water daily starting tomorrow.  Apply similar bandage. -Continue cam boot, nonweightbearing -Finish course of Xarelto -Ice/elevation -Pain medication as needed. -Monitor for any clinical signs or symptoms of infection and DVT/PE and directed to call the office immediately should any occur or go to the ER. -Follow-up as scheduled or sooner if any problems arise. In the meantime, encouraged to call the office with any questions, concerns, change in symptoms.   Celesta Gentile, DPM

## 2021-03-01 ENCOUNTER — Encounter: Payer: 59 | Admitting: Podiatry

## 2021-03-06 ENCOUNTER — Other Ambulatory Visit: Payer: Self-pay

## 2021-03-06 ENCOUNTER — Other Ambulatory Visit: Payer: Self-pay | Admitting: Podiatry

## 2021-03-06 ENCOUNTER — Ambulatory Visit (INDEPENDENT_AMBULATORY_CARE_PROVIDER_SITE_OTHER): Payer: 59 | Admitting: Podiatry

## 2021-03-06 DIAGNOSIS — M779 Enthesopathy, unspecified: Secondary | ICD-10-CM

## 2021-03-06 DIAGNOSIS — M76829 Posterior tibial tendinitis, unspecified leg: Secondary | ICD-10-CM

## 2021-03-06 DIAGNOSIS — T8189XA Other complications of procedures, not elsewhere classified, initial encounter: Secondary | ICD-10-CM

## 2021-03-06 MED ORDER — MUPIROCIN 2 % EX OINT: 1.0000 "application " | TOPICAL_OINTMENT | Freq: Two times a day (BID) | CUTANEOUS | 2 refills | Status: DC

## 2021-03-06 MED ORDER — DOXYCYCLINE HYCLATE 100 MG PO TABS
100.0000 mg | ORAL_TABLET | Freq: Two times a day (BID) | ORAL | 0 refills | Status: DC
Start: 2021-03-06 — End: 2021-08-28

## 2021-03-13 NOTE — Progress Notes (Signed)
Subjective: Haley White is a 65 y.o. is seen today in office s/p left foot exostectomy, repair posterior tibial tendon, peroneal tendon with gastrocnemius recession preformed on 01/31/2021.  She presents today she has noticed some mild drainage coming from the incision site.  States it has been more of clear drainage but denies any pus.  No increase in swelling or redness.  Denies any fevers, chills, nausea, vomiting.  No calf pain, chest pain or shortness of breath.  Objective: General: No acute distress, AAOx3  DP/PT pulses palpable 2/4, CRT < 3 sec to all digits.  Protective sensation intact. Motor function intact.  LEFT foot: Incision is well coapted without any evidence of dehiscence.  It appears that there are some superficial areas of skin breakdown along along where there is some underlying sutures and appears to be a suture reaction.  Incision itself is coapted.  There is no significant increase in edema there is no erythema or warmth.  No ascending cellulitis.  No fluctuance or crepitation.  No other areas of tenderness to bilateral lower extremities.  No other open lesions or pre-ulcerative lesions.  No pain with calf compression, swelling, warmth, erythema.   Assessment and Plan:  Status post left foot surgery, suture reaction  -Treatment options discussed including all alternatives, risks, and complications -I cleansed the wounds today.  Antibiotic ointment and dressing applied.  Mupirocin ointment ordered.  Prescribe doxycycline.  Continue nonweightbearing, ice elevation. -Monitor for any clinical signs or symptoms of infection and directed to call the office immediately should any occur or go to the ER.  Follow-up as scheduled  Trula Slade DPM

## 2021-03-15 ENCOUNTER — Other Ambulatory Visit: Payer: Self-pay

## 2021-03-15 ENCOUNTER — Ambulatory Visit (INDEPENDENT_AMBULATORY_CARE_PROVIDER_SITE_OTHER): Payer: 59 | Admitting: Podiatry

## 2021-03-15 DIAGNOSIS — M76829 Posterior tibial tendinitis, unspecified leg: Secondary | ICD-10-CM

## 2021-03-15 DIAGNOSIS — K219 Gastro-esophageal reflux disease without esophagitis: Secondary | ICD-10-CM | POA: Insufficient documentation

## 2021-03-15 DIAGNOSIS — J301 Allergic rhinitis due to pollen: Secondary | ICD-10-CM | POA: Insufficient documentation

## 2021-03-15 DIAGNOSIS — M779 Enthesopathy, unspecified: Secondary | ICD-10-CM

## 2021-03-15 DIAGNOSIS — J309 Allergic rhinitis, unspecified: Secondary | ICD-10-CM | POA: Insufficient documentation

## 2021-03-15 DIAGNOSIS — J3081 Allergic rhinitis due to animal (cat) (dog) hair and dander: Secondary | ICD-10-CM | POA: Insufficient documentation

## 2021-03-15 DIAGNOSIS — T8189XD Other complications of procedures, not elsewhere classified, subsequent encounter: Secondary | ICD-10-CM

## 2021-03-15 DIAGNOSIS — J4530 Mild persistent asthma, uncomplicated: Secondary | ICD-10-CM | POA: Insufficient documentation

## 2021-03-19 NOTE — Progress Notes (Signed)
Subjective: Haley White is a 65 y.o. is seen today in office s/p left foot exostectomy, repair posterior tibial tendon, peroneal tendon with gastrocnemius recession preformed on 01/31/2021.  States that she has been doing better.  She states the incisions is stopped draining except for the dorsal incision she does have very minimal drainage.  No pus.  She is still on the antibiotics and using the antibiotic ointment.  No increase in pain or swelling.  She denies any fevers, chills, nausea, vomiting.  No calf pain, chest pain or shortness of breath.  No other concerns today.    Objective: General: No acute distress, AAOx3  DP/PT pulses palpable 2/4, CRT < 3 sec to all digits.  Protective sensation intact. Motor function intact.  LEFT foot: Incision is well coapted without any evidence of dehiscence.  Small suture reaction still present on the dorsal midfoot but not able to elicit any drainage or pus.  Able to visualize the suture marks but appears to be scabbed over.  No areas of fluctuation crepitation.  No surrounding erythema, ascending cellulitis.  No malodor.  The medial lateral incisions of healed without any drainage.  No significant pain to palpation at surgical sites. No other open lesions or pre-ulcerative lesions.  No pain with calf compression, swelling, warmth, erythema.   Assessment and Plan:  Status post left foot surgery, suture reaction-improving  -Treatment options discussed including all alternatives, risks, and complications -Overall appears to be improving.  Continue antibiotic ointment dressings and finished the course of the oral antibiotics.  No signs of infection today no evidence of any drainage.  Continue daily dressing changes. -At this point discussed gradual transition to partial weightbearing.  Continue ice and elevate.  Return in about 2 weeks (around 03/29/2021).  Trula Slade DPM

## 2021-03-20 ENCOUNTER — Encounter: Payer: Self-pay | Admitting: Podiatry

## 2021-03-29 ENCOUNTER — Other Ambulatory Visit: Payer: Self-pay

## 2021-03-29 ENCOUNTER — Ambulatory Visit (INDEPENDENT_AMBULATORY_CARE_PROVIDER_SITE_OTHER): Payer: 59 | Admitting: Podiatry

## 2021-03-29 DIAGNOSIS — Z9889 Other specified postprocedural states: Secondary | ICD-10-CM

## 2021-03-29 DIAGNOSIS — M779 Enthesopathy, unspecified: Secondary | ICD-10-CM

## 2021-03-29 DIAGNOSIS — M76829 Posterior tibial tendinitis, unspecified leg: Secondary | ICD-10-CM

## 2021-04-01 NOTE — Progress Notes (Signed)
Subjective: Haley White is a 65 y.o. is seen today in office s/p left foot exostectomy, repair posterior tibial tendon, peroneal tendon with gastrocnemius recession preformed on 01/31/2021.  States that she still in discomfort is localized along the lateral aspect point on the peroneal tendon repair.  She has been partial weightbearing in the cam boot and using crutches for balance.  No recent injury or falls.  No increase in swelling.  The drainage that she experienced has resolved.  Denies any fevers, chills, nausea, vomiting.  No calf pain, chest pain, shortness of breath.  Objective: General: No acute distress, AAOx3  DP/PT pulses palpable 2/4, CRT < 3 sec to all digits.  Protective sensation intact. Motor function intact.  LEFT foot: Incision is well coapted without any evidence of dehiscence.  Scars are well formed and there is no drainage.  There is minimal edema to the surgical site there is no erythema or warmth.  Only area discomfort is on the course of the peroneal tendon posterior and inferior to the lateral malleolus.  There is no evidence of any subluxation noted during range of motion.  She gets discomfort about midway through inversion range of motion.  Patient describes some sharp pain, nerve issues to the area but it is more intermittent. No other open lesions or pre-ulcerative lesions.  No pain with calf compression, swelling, warmth, erythema.   Assessment and Plan:  Status post left foot surgery, suture reaction-improving  -Treatment options discussed including all alternatives, risks, and complications -Still continued have discomfort on the lateral aspect on the peroneal tendon.  This is where she had the significant tearing of the peroneal tendon.  At this point I want her to remain partial weightbearing in cam boot.  Discussed range of motion exercises to be performed at home.  I will see her back in 2 weeks and that point likely refer to physical therapy.  Trula Slade  DPM

## 2021-04-02 ENCOUNTER — Encounter: Payer: Self-pay | Admitting: Podiatry

## 2021-04-02 ENCOUNTER — Other Ambulatory Visit: Payer: Self-pay | Admitting: Podiatry

## 2021-04-02 MED ORDER — HYDROCODONE-ACETAMINOPHEN 5-325 MG PO TABS: 1.0000 | ORAL_TABLET | Freq: Four times a day (QID) | ORAL | 0 refills | Status: DC | PRN

## 2021-04-03 ENCOUNTER — Other Ambulatory Visit: Payer: Self-pay | Admitting: Podiatry

## 2021-04-03 DIAGNOSIS — S96919A Strain of unspecified muscle and tendon at ankle and foot level, unspecified foot, initial encounter: Secondary | ICD-10-CM

## 2021-04-03 DIAGNOSIS — M76829 Posterior tibial tendinitis, unspecified leg: Secondary | ICD-10-CM

## 2021-04-03 DIAGNOSIS — Z9889 Other specified postprocedural states: Secondary | ICD-10-CM

## 2021-04-04 ENCOUNTER — Ambulatory Visit: Payer: 59 | Attending: Podiatry

## 2021-04-04 ENCOUNTER — Other Ambulatory Visit: Payer: Self-pay

## 2021-04-04 DIAGNOSIS — R262 Difficulty in walking, not elsewhere classified: Secondary | ICD-10-CM | POA: Diagnosis present

## 2021-04-04 DIAGNOSIS — M25672 Stiffness of left ankle, not elsewhere classified: Secondary | ICD-10-CM | POA: Insufficient documentation

## 2021-04-04 DIAGNOSIS — M79672 Pain in left foot: Secondary | ICD-10-CM | POA: Insufficient documentation

## 2021-04-04 DIAGNOSIS — M6281 Muscle weakness (generalized): Secondary | ICD-10-CM | POA: Diagnosis present

## 2021-04-04 DIAGNOSIS — Z9889 Other specified postprocedural states: Secondary | ICD-10-CM | POA: Diagnosis not present

## 2021-04-04 NOTE — Therapy (Signed)
[place], [place], [address] Phone: [phone]   Fax:  [phone]  Physical Therapy Evaluation   Patient Details  Name: Haley White MRN: [id number] Date of Birth: 1955-11-12 Referring Provider (PT): Trula Slade, [place]   Encounter Date: 04/04/2021   PT End of Session - 04/04/21 1337    Visit Number 1    Number of Visits 17    Date for PT Re-Evaluation 06/16/21   POC date extended due to patient being on vacation for 2 weeks in July   Authorization Type [employer]: 30 VL    PT Start Time 1317    PT Stop Time 1402    PT Time Calculation (min) 45 min    Equipment Utilized During Treatment Other (comment)   CAM boot   Activity Tolerance Patient tolerated treatment well    Behavior During Therapy [facility] for tasks assessed/performed           Past Medical History:  Diagnosis Date  . Anxiety   . Arthritis   . Asthma   . Cataract   . Gout   . Hyperlipidemia   . Hypertension   . Obese     Past Surgical History:  Procedure Laterality Date  . APPENDECTOMY  1984  . CATARACT EXTRACTION W/ INTRAOCULAR LENS  IMPLANT, BILATERAL  2014  . [place]   X3  . COLONOSCOPY  11/16/2013   w/Brodie   . FOOT SURGERY Left 2002  . KNEE SURGERY Left 1998  . ROTATOR CUFF REPAIR Right 2007  . TUBAL LIGATION  1994    There were no vitals filed for this visit.    Subjective Assessment - 04/04/21 1320    Subjective Patient is s/p left foot exostectomy, repair posterior tibial tendon, peroneal tendon with gastrocnemius recession preformed on 01/31/2021. She reports she has phases of feeling good and not good. There are certain positions that can be really painful. She reports having pain along the calf on Monday and reached out to surgeon regarding this pain who recommended to continue ice/elevation and prescribed pain medication. She is currently using axillary crutches and wearing CAM  boot for ambulation. She occasionally uses her knee scooter for ambulation. She has f/u with Dr. Jacqualyn Posey next week. She denies pain currently, but at worst 8-9/10 with prolonged walking/standing or with inversion/eversion. She has trip to [place] planned on July 8th    Pertinent History hypertension, anxiety, depression, obesity    Limitations Standing;Walking;House hold activities;Lifting;Sitting    How long can you sit comfortably? keeping the foot down is a problem, about 5-10 minutes    How long can you stand comfortably? 5 minutes    How long can you walk comfortably? 10-15 minutes with CAM boot and crutches    Diagnostic tests MRI: IMPRESSION:  1. Moderate tendinopathy and a longitudinal split type tear  involving the peroneus brevis tendon. Mild tenosynovitis involving  both peroneal tendons.  2. Distal tendinopathy involving the attachment fibers of the  posterior tibialis tendon.  3. Chronic appearing Achilles tendinopathy.  4. Suspect prior surgical changes or possibly prior tear of the  plantar fascia. No acute tear/rupture.  5. Moderate midfoot degenerative changes. No stress fracture or AVN.    Patient Stated Goals "walking normal and painlessly. and a normal stride."    Currently in Pain? No/denies    Pain Onset --              [facility]  PT Assessment - 04/04/21 0001      Assessment   Medical Diagnosis PTTD (posterior tibial tendon dysfunction)  S96.919A (ICD-10-CM) - Tendon tear, ankle, initial encounter  Z98.890 (ICD-10-CM) - Status post left foot surgery    Referring Provider (PT) Trula Slade, DPM    Onset Date/Surgical Date 01/31/21    Hand Dominance Right    Next MD Visit 04/10/21    Prior Therapy yes      Precautions   Precautions None      Restrictions   Weight Bearing Restrictions Yes    LLE Weight Bearing Partial weight bearing    Other Position/Activity Restrictions CAM boot and axillary crutches      [place] residence     Living Arrangements Alone    Additional Comments 2 steps to enter      Prior Function   Level of Benjamin Perez Retired      Observation/Other Assessments   Observations incision sites well healed and no signs of infection, mild swelling present about Lt lateral ankle    Focus on Therapeutic Outcomes (FOTO)  21% function to 45% predicted      AROM   Left Ankle Dorsiflexion 5      Strength   Overall Strength Comments Lt ankle MMT deferred due to post op acuity; gross RLE strength 5/5    Left Hip Flexion 4/5    Left Hip Extension 4/5    Left Hip ABduction 4/5    Right Knee Flexion 5/5    Right Knee Extension 5/5    Left Knee Flexion 5/5    Left Knee Extension 5/5    Left Ankle Dorsiflexion 5/5      Ambulation/Gait   Ambulation/Gait Yes    Assistive device Crutches;Other (Comment)   cam boot   Gait Pattern Step-through pattern                      Objective measurements completed on examination: See above findings.       [place] Adult PT Treatment/Exercise - 04/04/21 0001      Knee/Hip Exercises: Standing   Other Standing Knee Exercises lateral weight shifts with CAM boot 5 reps      Knee/Hip Exercises: Sidelying   Hip ABduction --   reviewed     Ankle Exercises: Stretches   Gastroc Stretch 30 seconds      Ankle Exercises: Seated   Other Seated Ankle Exercises ankle pumps x 10; toe flexion/extension AROM x 10                  PT Education - 04/04/21 1610    Education Details Education on current condition, HEP, POC, FOTO score and anticipated progress, modalities for pain control, adjusted crutches to appropriate height.    Person(s) Educated Patient    Methods Explanation    Comprehension Verbalized understanding            PT Short Term Goals - 04/04/21 1606      PT SHORT TERM GOAL #1   Title Patient will be independent and compliant with established HEP.    Baseline issued at eval.    Time 3    Period  Weeks    Status New    Target Date 04/25/21      PT SHORT TERM GOAL #2   Title Patient will ambulate without axillary crutches.    Baseline CAM boot and axillary crutches; occasional  use of knee scooter    Time 3    Period Weeks    Status New    Target Date 04/25/21      PT SHORT TERM GOAL #3   Title Patient will demonstrate at least 10 degrees of Lt ankle dorsiflexion AROM to improve gait mechanics.    Baseline see flowsheet    Time 4    Period Weeks    Status New    Target Date 05/02/21             PT Long Term Goals - 04/04/21 1651      PT LONG TERM GOAL #1   Title Patient will score at least 45% function on FOTO to signify clinically meaningful improvement in functional abilities.    Baseline 21    Time 8    Period Weeks    Status New    Target Date 05/30/21      PT LONG TERM GOAL #2   Title Patient will demonstrate 5/5 Lt hip strength to improve stability about the chain for prolonged walking.    Baseline see flowsheet    Time 8    Period Weeks    Status New    Target Date 05/30/21      PT LONG TERM GOAL #3   Title Patient will demonstrate SLS on the LLE for at least 5 seconds to improve stability when walking on uneven terrain.    Baseline unable    Time 8    Period Weeks    Status New      PT LONG TERM GOAL #4   Title Patient will demonstrate normalized gait pattern with LRAD once cleared by surgeon to discontinue CAM boot.    Baseline antalgic gait with CAM boot    Time 8    Period Weeks    Status New    Target Date 05/30/21      PT LONG TERM GOAL #5   Title Patient will demonstrate 5/5 Lt ankle strength to improve stability necessary for community ambulation    Baseline not assessed    Time 8    Period Weeks    Status New    Target Date 05/30/21      Additional Long Term Goals   Additional Long Term Goals Yes      PT LONG TERM GOAL #6   Title Patient will report <2/10 pain with prolonged walking/standing activity.    Baseline 8-9/10     Time 8    Period Weeks    Status New    Target Date 05/30/21                  Plan - 04/04/21 1646    Clinical Impression Statement Patient is a 65 y/o female who presents to OPPT s/p left foot exostectomy, repair posterior tibial tendon and peroneal tendon with gastrocnemius recession on 01/31/2021. She is currently PWB in CAM boot with axillary crutches with surgeon instruction to progress towards WBAT in the CAM boot. She has LLE weakness, limited ankle ROM, balance and gait deficits, and pain that are consistent with her recent post-op status. She will benefit from skilled PT to address the above stated deficits in order to optimize her function.    Personal Factors and Comorbidities Age;Time since onset of injury/illness/exacerbation    Examination-Activity Limitations Carry;Lift;Locomotion Level;Stairs;Stand;Sit;Transfers;Squat    Examination-Participation Restrictions Church;Cleaning;Community Activity;Laundry;Meal Prep;Shop    Stability/Clinical Decision Making Stable/Uncomplicated    Clinical Decision Making Low  Rehab Potential Good    PT Frequency --   1-2/week   PT Duration 8 weeks    PT Treatment/Interventions ADLs/Self Care Home Management;Cryotherapy;Software engineer;Therapeutic activities;Therapeutic exercise;Balance training;Neuromuscular re-education;Patient/family education;Manual techniques;Passive range of motion;Dry needling;Taping;Vasopneumatic Device;Stair training    PT Next Visit Plan review HEP, gait training in CAM boot as tolerated, ankle isometrics, LLE strengthening    PT Home Exercise Plan Access Code [email or web address]    Consulted and Agree with Plan of Care Patient           Patient will benefit from skilled therapeutic intervention in order to improve the following deficits and impairments:  Abnormal gait,Decreased range of motion,Difficulty walking,Decreased endurance,Decreased activity tolerance,Pain,Decreased  balance,Impaired flexibility,Decreased strength  Visit Diagnosis: Status post left foot surgery  Pain in left foot  Muscle weakness (generalized)  Difficulty in walking, not elsewhere classified     Problem List Patient Active Problem List   Diagnosis Date Noted  . Allergic rhinitis 03/15/2021  . Allergic rhinitis due to animal (cat) (dog) hair and dander 03/15/2021  . Allergic rhinitis due to pollen 03/15/2021  . Gastro-esophageal reflux disease without esophagitis 03/15/2021  . Mild persistent asthma, uncomplicated 27/04/2375  . Aortic atherosclerosis ([place]) by Chest CT scan on 02/02/2018  01/10/2021  . Asthma   . Low back pain 03/17/2019  . Lumbosacral spondylosis without myelopathy 03/17/2019  . Cubital tunnel syndrome 10/14/2018  . Closed fracture of distal end of right radius 07/09/2018  . 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 Major depressive disorder, recurrent, severe without psychotic features ([place])   . 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 Suicide attempt by drug ingestion ([place])   . Medication management 08/22/2014  . Vitamin D deficiency 08/22/2014  . Hyperlipidemia, mixed 08/22/2014  . Hypertension   . Anxiety   . Abnormal glucose   . Class 2 severe obesity due to excess calories with serious comorbidity and body mass index (BMI) of 37.0 to 37.9 in adult [facility])    Gwendolyn Grant, PT, DPT, ATC 04/04/21 5:21 PM  [facility] [address] [place], [place], [address] Phone: [phone]   Fax:  [phone]  Name: Haley White MRN: [id number] Date of Birth: 09-12-1956

## 2021-04-10 ENCOUNTER — Ambulatory Visit: Payer: 59

## 2021-04-10 ENCOUNTER — Encounter: Payer: Self-pay | Admitting: Rehabilitative and Restorative Service Providers"

## 2021-04-10 ENCOUNTER — Ambulatory Visit (INDEPENDENT_AMBULATORY_CARE_PROVIDER_SITE_OTHER): Payer: 59 | Admitting: Podiatry

## 2021-04-10 ENCOUNTER — Encounter: Payer: Self-pay | Admitting: Podiatry

## 2021-04-10 ENCOUNTER — Other Ambulatory Visit: Payer: Self-pay

## 2021-04-10 ENCOUNTER — Ambulatory Visit: Payer: 59 | Admitting: Rehabilitative and Restorative Service Providers"

## 2021-04-10 DIAGNOSIS — M25672 Stiffness of left ankle, not elsewhere classified: Secondary | ICD-10-CM

## 2021-04-10 DIAGNOSIS — Z9889 Other specified postprocedural states: Secondary | ICD-10-CM

## 2021-04-10 DIAGNOSIS — M779 Enthesopathy, unspecified: Secondary | ICD-10-CM

## 2021-04-10 DIAGNOSIS — R262 Difficulty in walking, not elsewhere classified: Secondary | ICD-10-CM

## 2021-04-10 DIAGNOSIS — M76829 Posterior tibial tendinitis, unspecified leg: Secondary | ICD-10-CM

## 2021-04-10 DIAGNOSIS — M6281 Muscle weakness (generalized): Secondary | ICD-10-CM

## 2021-04-10 DIAGNOSIS — M79672 Pain in left foot: Secondary | ICD-10-CM

## 2021-04-10 DIAGNOSIS — S96919A Strain of unspecified muscle and tendon at ankle and foot level, unspecified foot, initial encounter: Secondary | ICD-10-CM

## 2021-04-10 MED ORDER — GABAPENTIN 100 MG PO CAPS: 100.0000 mg | ORAL_CAPSULE | Freq: Every day | ORAL | 0 refills | Status: DC

## 2021-04-10 NOTE — Therapy (Signed)
[place], [place], [address] Phone: [phone]   Fax:  [phone]  Physical Therapy Treatment  Patient Details  Name: Haley White MRN: [id number] Date of Birth: September 18, 1956 Referring Provider (PT): Trula Slade, [place]   Encounter Date: 04/10/2021   PT End of Session - 04/10/21 1613    Visit Number 2    Number of Visits 17    Date for PT Re-Evaluation 06/16/21    PT Start Time 0316    PT Stop Time 0404    PT Time Calculation (min) 48 min    Equipment Utilized During Treatment Other (comment)    Activity Tolerance Patient tolerated treatment well    Behavior During Therapy [facility] for tasks assessed/performed           Past Medical History:  Diagnosis Date  . Anxiety   . Arthritis   . Asthma   . Cataract   . Gout   . Hyperlipidemia   . Hypertension   . Obese     Past Surgical History:  Procedure Laterality Date  . APPENDECTOMY  1984  . CATARACT EXTRACTION W/ INTRAOCULAR LENS  IMPLANT, BILATERAL  2014  . [place]   X3  . COLONOSCOPY  11/16/2013   w/Brodie   . FOOT SURGERY Left 2002  . KNEE SURGERY Left 1998  . ROTATOR CUFF REPAIR Right 2007  . TUBAL LIGATION  1994    There were no vitals filed for this visit.   Subjective Assessment - 04/10/21 1519    Subjective I am good except for one place in my ankle. I saw Jacqualyn Posey this morning; putting me on gabapentin at night for that and he thinks over time the pain will go away.    Currently in Pain? Yes    Pain Score 3     Pain Location Ankle    Pain Orientation Right    Pain Descriptors / Indicators Sharp    Pain Type Surgical pain    Pain Radiating Towards L lateral malleoli    Multiple Pain Sites No              [facility] PT Assessment - 04/10/21 0001      Precautions   Precaution Comments Per 04/06/21 MD message to evaluating PT: "She has been having most of her pain along the peroneal tendons. She had  a pretty extensive tear of the tendon and I believe that is why she is having pain still in that area. She has been walking in the boot with 2 crutches for balance. I would like to start manual therapy to help with edema reduction and range of motion. I do want to take her a little slow given the tear on the tendon. Her goal is to be able to walk in the boot before she goes on vacation to [place] this summer. Let me know if you have any questions. "                         [place] Adult PT Treatment/Exercise - 04/10/21 0001      Exercises   Exercises Ankle;Knee/Hip      Knee/Hip Exercises: Seated   Other Seated Knee/Hip Exercises Review Gastroc stretch with sheet in long sitting 2x30 sec      Knee/Hip Exercises: Supine   Other Supine Knee/Hip Exercises all performed to L LE: heel slides x 12, SLR x 12, SLR/hip abdct  combo x 12, SAQ x 15      Knee/Hip Exercises: Sidelying   Clams L x 10 with foot in iso DF pt stated was more comfortable    Other Sidelying Knee/Hip Exercises R sidelying L hip abdct x 15      Ankle Exercises: Standing   Other Standing Ankle Exercises reviewed weightshift at the countertop with CAM boot donned.      Ankle Exercises: Supine   T-Band PF/DF/inversion x 10 each (YTB lightly held)    Other Supine Ankle Exercises ankle pumps x 10, inversion/eversion limited AROM 2 x 10 each, toe flexion/splaying x 10 each                  PT Education - 04/10/21 1610    Education Details Advised pt to ice as needed at home; let her know she may be sore tomorrow and to ice and gently do exercise. discussed importance of not working through the pain today multiple times due to pt not stopping exercise with pain; PT discussed that therapist could modify exercises if uncomfortable and she is not to work thru pain (discussed finding a painfree position for exercises to be completed); discussed difference in between soreness and pain as well.    Person(s) Educated  Patient    Methods Explanation    Comprehension Verbalized understanding            PT Short Term Goals - 04/10/21 1622      PT SHORT TERM GOAL #1   Title Patient will be independent and compliant with established HEP.    Status On-going      PT SHORT TERM GOAL #2   Title Patient will ambulate without axillary crutches.    Status On-going      PT SHORT TERM GOAL #3   Title Patient will demonstrate at least 10 degrees of Lt ankle dorsiflexion AROM to improve gait mechanics.    Status On-going             PT Long Term Goals - 04/04/21 1651      PT LONG TERM GOAL #1   Title Patient will score at least 45% function on FOTO to signify clinically meaningful improvement in functional abilities.    Baseline 21    Time 8    Period Weeks    Status New    Target Date 05/30/21      PT LONG TERM GOAL #2   Title Patient will demonstrate 5/5 Lt hip strength to improve stability about the chain for prolonged walking.    Baseline see flowsheet    Time 8    Period Weeks    Status New    Target Date 05/30/21      PT LONG TERM GOAL #3   Title Patient will demonstrate SLS on the LLE for at least 5 seconds to improve stability when walking on uneven terrain.    Baseline unable    Time 8    Period Weeks    Status New      PT LONG TERM GOAL #4   Title Patient will demonstrate normalized gait pattern with LRAD once cleared by surgeon to discontinue CAM boot.    Baseline antalgic gait with CAM boot    Time 8    Period Weeks    Status New    Target Date 05/30/21      PT LONG TERM GOAL #5   Title Patient will demonstrate 5/5 Lt ankle strength to improve stability  necessary for community ambulation    Baseline not assessed    Time 8    Period Weeks    Status New    Target Date 05/30/21      Additional Long Term Goals   Additional Long Term Goals Yes      PT LONG TERM GOAL #6   Title Patient will report <2/10 pain with prolonged walking/standing activity.    Baseline 8-9/10     Time 8    Period Weeks    Status New    Target Date 05/30/21                 Plan - 04/10/21 1546    Clinical Impression Statement Patient is a 65 y/o female who presents to OPPT s/p left foot exostectomy, repair posterior tibial tendon and peroneal tendon with gastrocnemius recession on 01/31/2021. She is currently PWB in CAM boot with axillary crutches with surgeon instruction to progress towards WBAT in the CAM boot. She has LLE weakness, limited ankle ROM, balance and gait deficits, and pain that are consistent with her recent post-op status. She will benefit from skilled PT to address the above stated deficits in order to optimize her function. Pt tolerated tx this visit well;had slight discomfort with L ankle eversion. PT repeatedly reminded pt to stay in pain-free ROM with exercises. precautions updated per MD consultation with evaluating PT 04/09/21; referral states to begin "gradually work the peroneal tendon." Pt without increased pain after tx; advised pt to ice as needed to help with soreness and pain.    PT Treatment/Interventions ADLs/Self Care Home Management;Cryotherapy;Software engineer;Therapeutic activities;Therapeutic exercise;Balance training;Neuromuscular re-education;Patient/family education;Manual techniques;Passive range of motion;Dry needling;Taping;Vasopneumatic Device;Stair training    PT Next Visit Plan continue L hip/knee strengthening and manual therapy only for L ankle, begin weaning from crutches but remaining PWB in CAM boot; see precautions    Consulted and Agree with Plan of Care Patient           Patient will benefit from skilled therapeutic intervention in order to improve the following deficits and impairments:  Abnormal gait,Decreased range of motion,Difficulty walking,Decreased endurance,Decreased activity tolerance,Pain,Decreased balance,Impaired flexibility,Decreased strength  Visit Diagnosis: Status post left foot  surgery  Pain in left foot  Muscle weakness (generalized)  Difficulty in walking, not elsewhere classified  Stiffness of left ankle, not elsewhere classified     Problem List Patient Active Problem List   Diagnosis Date Noted  . Allergic rhinitis 03/15/2021  . Allergic rhinitis due to animal (cat) (dog) hair and dander 03/15/2021  . Allergic rhinitis due to pollen 03/15/2021  . Gastro-esophageal reflux disease without esophagitis 03/15/2021  . Mild persistent asthma, uncomplicated 60/45/4098  . Aortic atherosclerosis ([place]) by Chest CT scan on 02/02/2018  01/10/2021  . Asthma   . Low back pain 03/17/2019  . Lumbosacral spondylosis without myelopathy 03/17/2019  . Cubital tunnel syndrome 10/14/2018  . Closed fracture of distal end of right radius 07/09/2018  . 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 Major depressive disorder, recurrent, severe without psychotic features ([place])   . 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 Suicide attempt by drug ingestion ([place])   . Medication management 08/22/2014  . Vitamin D deficiency 08/22/2014  . Hyperlipidemia, mixed 08/22/2014  . Hypertension   . Anxiety   . Abnormal glucose   . Class 2 severe obesity due to excess calories with serious comorbidity and body mass index (BMI) of 37.0 to 37.9 in adult [facility])     America Brown, PT, DPT 04/10/2021,  4:30 PM  [place], [place], [address] Phone: [phone]   Fax:  [phone]  Name: Haley White MRN: [id number] Date of Birth: 1956-06-16

## 2021-04-12 ENCOUNTER — Ambulatory Visit: Payer: 59

## 2021-04-15 NOTE — Progress Notes (Signed)
Subjective: Haley White is a 65 y.o. is seen today in office s/p left foot exostectomy, repair posterior tibial tendon, peroneal tendon with gastrocnemius recession preformed on 01/31/2021.  She has gone to 1 physical therapy session for the evaluation.  She is scheduled to follow-up for this.  She states that she is doing well on her foot except for 1 area next to the lateral aspect of the ankle.  She describes sharp, burning discomfort to the area and almost a pinching sensation and tingling that she reports.  It is intermittent. Denies any fevers, chills, nausea, vomiting.  No calf pain, chest pain, shortness of breath.  Objective: General: No acute distress, AAOx3  DP/PT pulses palpable 2/4, CRT < 3 sec to all digits.  Protective sensation intact. Motor function intact.  LEFT foot: Incision is well coapted without any evidence of dehiscence.  There is no drainage.  There is no surrounding erythema or warmth and no signs of infection.  No tenderness to medial, dorsal foot.  There is tenderness on the course the peroneal tendon.  No obvious subluxation is noted of the tendon and I compared this to contralateral extremity.  There is minimal edema but there is no erythema or warmth. No other open lesions or pre-ulcerative lesions.  No pain with calf compression, swelling, warmth, erythema.   Assessment and Plan:  Status post left foot surgery  -Treatment options discussed including all alternatives, risks, and complications -We discussed the MRI however at this point I do not think his current changes with treatment.  Start physical therapy.  Also given her symptoms we will plan to prescribe gabapentin and discussed side effects of medication.  She has an upcoming vacation planned next month and her goal is to be walking in the boot at that point if able.  If she continues to get symptoms we will repeat the MRI.  Again the reason why she is having discomfort mostly at the one area that is with a  significant tear of the peroneal tendon was intact.  Trula Slade DPM

## 2021-04-17 ENCOUNTER — Encounter: Payer: Self-pay | Admitting: Rehabilitative and Restorative Service Providers"

## 2021-04-17 ENCOUNTER — Other Ambulatory Visit: Payer: Self-pay

## 2021-04-17 ENCOUNTER — Ambulatory Visit: Payer: 59 | Admitting: Rehabilitative and Restorative Service Providers"

## 2021-04-17 DIAGNOSIS — M25672 Stiffness of left ankle, not elsewhere classified: Secondary | ICD-10-CM

## 2021-04-17 DIAGNOSIS — R262 Difficulty in walking, not elsewhere classified: Secondary | ICD-10-CM

## 2021-04-17 DIAGNOSIS — M79672 Pain in left foot: Secondary | ICD-10-CM

## 2021-04-17 DIAGNOSIS — Z9889 Other specified postprocedural states: Secondary | ICD-10-CM

## 2021-04-17 DIAGNOSIS — M6281 Muscle weakness (generalized): Secondary | ICD-10-CM

## 2021-04-17 NOTE — Therapy (Signed)
[place], [place], [address] Phone: [phone]   Fax:  [phone]  Physical Therapy Treatment  Patient Details  Name: Haley White MRN: [id number] Date of Birth: 09/06/1956 Referring Provider (PT): Trula Slade, [place]   Encounter Date: 04/17/2021   PT End of Session - 04/17/21 1039     Visit Number 3    Number of Visits 17    Date for PT Re-Evaluation 06/16/21    Authorization Type [employer]: 30 VL    PT Start Time 1003    PT Stop Time 1050    PT Time Calculation (min) 47 min    Activity Tolerance Patient tolerated treatment well    Behavior During Therapy [facility] for tasks assessed/performed             Past Medical History:  Diagnosis Date   Anxiety    Arthritis    Asthma    Cataract    Gout    Hyperlipidemia    Hypertension    Obese     Past Surgical History:  Procedure Laterality Date   APPENDECTOMY  1984   CATARACT EXTRACTION W/ INTRAOCULAR LENS  IMPLANT, BILATERAL  2014   CESAREAN SECTION  1984, 1987, 1994   X3   COLONOSCOPY  11/16/2013   w/Brodie    FOOT SURGERY Left 2002   [place] Right 2007   TUBAL LIGATION  1994    There were no vitals filed for this visit.   Subjective Assessment - 04/17/21 1006     Subjective Good still except for that one little spot (lateral malleoli). Dr said he may do an MRI if it keeps hurting. I see him next week.    Currently in Pain? Yes    Pain Score 2     Pain Location Ankle    Pain Orientation Right    Pain Descriptors / Indicators [facility] Adult PT Treatment/Exercise - 04/17/21 0001       Knee/Hip Exercises: Supine   Other Supine Knee/Hip Exercises SLR 2x10; SLR/hip abdct combo 2x10    Other Supine Knee/Hip Exercises SLR with hip circles CW/CCW x 10 each      Knee/Hip Exercises: Sidelying   Clams R sidelying L hip clam shell  lifting foot x 20; R sidelying L hip abdct/limited AROM hip flex and ext combo x 20      Manual Therapy   Manual therapy comments PROM L ankle all planes;metatarsal stretching/mobs grade 2, plantar fascia stretching                    PT Education - 04/17/21 1036     Education Details discussed with pt keeping leg in neutral with gait with crutches due to pt arriving to therapy with left hip completely ER; reminded pt to stand up straight when using axillary crutches to prevent nerve impingement    Person(s) Educated Patient    Methods Explanation    Comprehension Verbalized understanding              PT Short Term Goals - 04/17/21 1042       PT SHORT TERM GOAL #1   Title Patient will be independent and compliant with established HEP.    Status On-going  PT SHORT TERM GOAL #2   Title Patient will ambulate without axillary crutches.    Status On-going      PT SHORT TERM GOAL #3   Title Patient will demonstrate at least 10 degrees of Lt ankle dorsiflexion AROM to improve gait mechanics.    Status On-going               PT Long Term Goals - 04/04/21 1651       PT LONG TERM GOAL #1   Title Patient will score at least 45% function on FOTO to signify clinically meaningful improvement in functional abilities.    Baseline 21    Time 8    Period Weeks    Status New    Target Date 05/30/21      PT LONG TERM GOAL #2   Title Patient will demonstrate 5/5 Lt hip strength to improve stability about the chain for prolonged walking.    Baseline see flowsheet    Time 8    Period Weeks    Status New    Target Date 05/30/21      PT LONG TERM GOAL #3   Title Patient will demonstrate SLS on the LLE for at least 5 seconds to improve stability when walking on uneven terrain.    Baseline unable    Time 8    Period Weeks    Status New      PT LONG TERM GOAL #4   Title Patient will demonstrate normalized gait pattern with LRAD once cleared by surgeon to  discontinue CAM boot.    Baseline antalgic gait with CAM boot    Time 8    Period Weeks    Status New    Target Date 05/30/21      PT LONG TERM GOAL #5   Title Patient will demonstrate 5/5 Lt ankle strength to improve stability necessary for community ambulation    Baseline not assessed    Time 8    Period Weeks    Status New    Target Date 05/30/21      Additional Long Term Goals   Additional Long Term Goals Yes      PT LONG TERM GOAL #6   Title Patient will report <2/10 pain with prolonged walking/standing activity.    Baseline 8-9/10    Time 8    Period Weeks    Status New    Target Date 05/30/21                   Plan - 04/17/21 1039     Clinical Impression Statement Patient is a 65 y/o female who presents to OPPT s/p left foot exostectomy, repair posterior tibial tendon and peroneal tendon with gastrocnemius recession on 01/31/2021. She is currently PWB in CAM boot with axillary crutches with surgeon instruction to progress towards WBAT in the CAM boot. She has LLE weakness, limited ankle ROM, balance and gait deficits, and pain that are consistent with her recent post-op status. She will benefit from skilled PT to address the above stated deficits in order to optimize her function. See precautions. Pt arrived with little bubble of swelling anterior and inferior to L lateral malleoli. PT advised pt to monitor. Her pain remains at a 2/10    PT Treatment/Interventions ADLs/Self Care Home Management;Cryotherapy;Software engineer;Therapeutic activities;Therapeutic exercise;Balance training;Neuromuscular re-education;Patient/family education;Manual techniques;Passive range of motion;Dry needling;Taping;Vasopneumatic Device;Stair training    PT Next Visit Plan continue L hip/knee strengthening and manual therapy only for  L ankle, begin weaning from crutches but remaining PWB in CAM boot; see precautions    Consulted and Agree with Plan of Care  Patient             Patient will benefit from skilled therapeutic intervention in order to improve the following deficits and impairments:  Abnormal gait, Decreased range of motion, Difficulty walking, Decreased endurance, Decreased activity tolerance, Pain, Decreased balance, Impaired flexibility, Decreased strength  Visit Diagnosis: Status post left foot surgery  Pain in left foot  Difficulty in walking, not elsewhere classified  Muscle weakness (generalized)  Stiffness of left ankle, not elsewhere classified     Problem List Patient Active Problem List   Diagnosis Date Noted   Allergic rhinitis 03/15/2021   Allergic rhinitis due to animal (cat) (dog) hair and dander 03/15/2021   Allergic rhinitis due to pollen 03/15/2021   Gastro-esophageal reflux disease without esophagitis 03/15/2021   Mild persistent asthma, uncomplicated 41/32/4401   Aortic atherosclerosis ([place]) by Chest CT scan on 02/02/2018  01/10/2021   Asthma    Low back pain 03/17/2019   Lumbosacral spondylosis without myelopathy 03/17/2019   Cubital tunnel syndrome 10/14/2018   Closed fracture of distal end of right radius 07/09/2018   Pseudogout 12/09/2017   Major depressive disorder, recurrent, severe without psychotic features ([place])    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 [address] [place], [place], [address] Phone: [phone]   Fax:  [phone]  Name: Haley White MRN: [id number] Date of Birth: 1956/02/11

## 2021-04-19 ENCOUNTER — Encounter: Payer: Self-pay | Admitting: Physical Therapy

## 2021-04-19 ENCOUNTER — Other Ambulatory Visit: Payer: Self-pay

## 2021-04-19 ENCOUNTER — Ambulatory Visit: Payer: 59 | Admitting: Physical Therapy

## 2021-04-19 DIAGNOSIS — M6281 Muscle weakness (generalized): Secondary | ICD-10-CM

## 2021-04-19 DIAGNOSIS — Z9889 Other specified postprocedural states: Secondary | ICD-10-CM

## 2021-04-19 DIAGNOSIS — R262 Difficulty in walking, not elsewhere classified: Secondary | ICD-10-CM

## 2021-04-19 DIAGNOSIS — M25672 Stiffness of left ankle, not elsewhere classified: Secondary | ICD-10-CM

## 2021-04-19 DIAGNOSIS — M79672 Pain in left foot: Secondary | ICD-10-CM

## 2021-04-19 NOTE — Therapy (Signed)
[place], [place], [address] Phone: [phone]   Fax:  [phone]  Physical Therapy Treatment  Patient Details  Name: Haley White MRN: [id number] Date of Birth: 23-Jun-1956 Referring Provider (PT): Trula Slade, [place]   Encounter Date: 04/19/2021   PT End of Session - 04/19/21 1108     Visit Number 4    Number of Visits 17    Date for PT Re-Evaluation 06/16/21    Authorization Type [employer]: 30 VL    PT Start Time 1100    PT Stop Time 6962    PT Time Calculation (min) 45 min             Past Medical History:  Diagnosis Date   Anxiety    Arthritis    Asthma    Cataract    Gout    Hyperlipidemia    Hypertension    Obese     Past Surgical History:  Procedure Laterality Date   APPENDECTOMY  1984   CATARACT EXTRACTION W/ INTRAOCULAR LENS  IMPLANT, BILATERAL  2014   CESAREAN SECTION  1984, 1987, 1994   X3   COLONOSCOPY  11/16/2013   w/Brodie    FOOT SURGERY Left 2002   KNEE SURGERY Left 1998   ROTATOR CUFF REPAIR Right 2007   TUBAL LIGATION  1994    There were no vitals filed for this visit.   Subjective Assessment - 04/19/21 1105     Subjective 3/10 today in the lateral ankle. My walking is getting bette.r    Currently in Pain? Yes    Pain Score 3     Pain Location Ankle    Pain Orientation Right;Lateral    Pain Descriptors / Indicators Sharp    Pain Type Surgical pain    Aggravating Factors  alot of activity on feet, going too far without the crutch, eversion    Pain Relieving Factors avoid aggravating factors, ice                               [facility] Adult PT Treatment/Exercise - 04/19/21 0001       Ambulation/Gait   Ambulation Distance (Feet) 50 Feet    Assistive device R Axillary Crutch    Gait Comments Gait in cam boot with single crutch- good carry over with min cues for technique. No increased pain.      Knee/Hip Exercises:  Sidelying   Hip ABduction 10 reps      Manual Therapy   Manual Therapy Other (comment)    Manual therapy comments light touch efflourage  only tolerated due to sensitivity    Other Manual Therapy desensitization with ace wrap and wash cloth to lateral ankle and dorsal foot at incisions.      Ankle Exercises: Stretches   Soleus Stretch 30 seconds   2 x strap   Gastroc Stretch 30 seconds   2 x strap     Ankle Exercises: Supine   Other Supine Ankle Exercises supine small progressive ankle circles.    Other Supine Ankle Exercises isometric Eversion 5 sec x 10 seated , 1 set knee bent one set knee straight- no pain   able to perform eversion AROM without pan afterward     Ankle Exercises: Seated   Towel Inversion/Eversion --   unable to tolerate   Heel Raises 10 reps    Toe Raise 10 reps  PT Short Term Goals - 04/17/21 1042       PT SHORT TERM GOAL #1   Title Patient will be independent and compliant with established HEP.    Status On-going      PT SHORT TERM GOAL #2   Title Patient will ambulate without axillary crutches.    Status On-going      PT SHORT TERM GOAL #3   Title Patient will demonstrate at least 10 degrees of Lt ankle dorsiflexion AROM to improve gait mechanics.    Status On-going               PT Long Term Goals - 04/04/21 1651       PT LONG TERM GOAL #1   Title Patient will score at least 45% function on FOTO to signify clinically meaningful improvement in functional abilities.    Baseline 21    Time 8    Period Weeks    Status New    Target Date 05/30/21      PT LONG TERM GOAL #2   Title Patient will demonstrate 5/5 Lt hip strength to improve stability about the chain for prolonged walking.    Baseline see flowsheet    Time 8    Period Weeks    Status New    Target Date 05/30/21      PT LONG TERM GOAL #3   Title Patient will demonstrate SLS on the LLE for at least 5 seconds to improve stability when walking  on uneven terrain.    Baseline unable    Time 8    Period Weeks    Status New      PT LONG TERM GOAL #4   Title Patient will demonstrate normalized gait pattern with LRAD once cleared by surgeon to discontinue CAM boot.    Baseline antalgic gait with CAM boot    Time 8    Period Weeks    Status New    Target Date 05/30/21      PT LONG TERM GOAL #5   Title Patient will demonstrate 5/5 Lt ankle strength to improve stability necessary for community ambulation    Baseline not assessed    Time 8    Period Weeks    Status New    Target Date 05/30/21      Additional Long Term Goals   Additional Long Term Goals Yes      PT LONG TERM GOAL #6   Title Patient will report <2/10 pain with prolonged walking/standing activity.    Baseline 8-9/10    Time 8    Period Weeks    Status New    Target Date 05/30/21                   Plan - 04/19/21 1147     Clinical Impression Statement Pt arrives reporting continued pain with eversion motions as well as hypersensitivity to touch at lateral malleoli. Desensitization with ace wrap and wash cloth used to decrease hypersensitivity. Her light touch tolerance improved with repetition. AROM eversion on towel not tolerated without pain. Able to complete isometric eversion in sitting and supine without increased ankle pain. Continued with hip strength and calf stretching. After donning cam boot at end of session she reported that her lateral ankle was irritated. She was given isometrics and desensitization for HEP however was cautioned to discontinue if it continued to irritate her ankle. She declined ice at clinic.  Worked on gait with one crutch and step  though pattern at end of session. She required min cues and had good carryover. She will go to [place] in 3 weeks and needs to walk a half mile.    PT Next Visit Plan continue L hip/knee strengthening and manual therapy only for L ankle, begin weaning from crutches but remaining PWB in CAM boot;  see precautions    PT Home Exercise Plan Access Code [email or web address], added desensitization and isometric ankle eversion.             Patient will benefit from skilled therapeutic intervention in order to improve the following deficits and impairments:  Abnormal gait, Decreased range of motion, Difficulty walking, Decreased endurance, Decreased activity tolerance, Pain, Decreased balance, Impaired flexibility, Decreased strength  Visit Diagnosis: Status post left foot surgery  Pain in left foot  Difficulty in walking, not elsewhere classified  Muscle weakness (generalized)  Stiffness of left ankle, not elsewhere classified     Problem List Patient Active Problem List   Diagnosis Date Noted   Allergic rhinitis 03/15/2021   Allergic rhinitis due to animal (cat) (dog) hair and dander 03/15/2021   Allergic rhinitis due to pollen 03/15/2021   Gastro-esophageal reflux disease without esophagitis 03/15/2021   Mild persistent asthma, uncomplicated 08/28/8526   Aortic atherosclerosis ([place]) by Chest CT scan on 02/02/2018  01/10/2021   Asthma    Low back pain 03/17/2019   Lumbosacral spondylosis without myelopathy 03/17/2019   Cubital tunnel syndrome 10/14/2018   Closed fracture of distal end of right radius 07/09/2018   Pseudogout 12/09/2017   Major depressive disorder, recurrent, severe without psychotic features ([place])    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

## 2021-04-24 ENCOUNTER — Other Ambulatory Visit: Payer: Self-pay

## 2021-04-24 ENCOUNTER — Encounter: Payer: Self-pay | Admitting: Podiatry

## 2021-04-24 ENCOUNTER — Ambulatory Visit (INDEPENDENT_AMBULATORY_CARE_PROVIDER_SITE_OTHER): Payer: 59 | Admitting: Podiatry

## 2021-04-24 ENCOUNTER — Ambulatory Visit: Payer: 59

## 2021-04-24 DIAGNOSIS — R262 Difficulty in walking, not elsewhere classified: Secondary | ICD-10-CM

## 2021-04-24 DIAGNOSIS — S96919A Strain of unspecified muscle and tendon at ankle and foot level, unspecified foot, initial encounter: Secondary | ICD-10-CM

## 2021-04-24 DIAGNOSIS — M76829 Posterior tibial tendinitis, unspecified leg: Secondary | ICD-10-CM

## 2021-04-24 DIAGNOSIS — M6281 Muscle weakness (generalized): Secondary | ICD-10-CM

## 2021-04-24 DIAGNOSIS — Z9889 Other specified postprocedural states: Secondary | ICD-10-CM

## 2021-04-24 DIAGNOSIS — M25672 Stiffness of left ankle, not elsewhere classified: Secondary | ICD-10-CM

## 2021-04-24 DIAGNOSIS — M79672 Pain in left foot: Secondary | ICD-10-CM

## 2021-04-24 NOTE — Therapy (Signed)
[place], [place], [address] Phone: [phone]   Fax:  [phone]  Physical Therapy Treatment  Patient Details  Name: Haley White MRN: [id number] Date of Birth: [birth year] Referring Provider (PT): Trula Slade, [place]   Encounter Date: 04/24/2021   PT End of Session - 04/24/21 1147     Visit Number 5    Number of Visits 17    Date for PT Re-Evaluation 06/16/21    Authorization Type [employer]: 30 VL    PT Start Time 1147    PT Stop Time 1230    PT Time Calculation (min) 43 min    Activity Tolerance Patient tolerated treatment well    Behavior During Therapy [facility] for tasks assessed/performed             Past Medical History:  Diagnosis Date   Anxiety    Arthritis    Asthma    Cataract    Gout    Hyperlipidemia    Hypertension    Obese     Past Surgical History:  Procedure Laterality Date   APPENDECTOMY  1984   CATARACT EXTRACTION W/ INTRAOCULAR LENS  IMPLANT, BILATERAL  2014   CESAREAN SECTION  1984, 1987, 1994   X3   COLONOSCOPY  11/16/2013   w/Brodie    FOOT SURGERY Left 2002   [place] Right 2007   TUBAL LIGATION  1994    There were no vitals filed for this visit.   Subjective Assessment - 04/24/21 1148     Subjective "It's doing pretty good actually. Unless I'm on it too much my foot is doing pretty good."    Currently in Pain? No/denies                [facility] PT Assessment - 04/24/21 0001       AROM   Left Ankle Dorsiflexion 9                           [facility] Adult PT Treatment/Exercise - 04/24/21 0001       Ambulation/Gait   Ambulation Distance (Feet) 75 Feet    Assistive device Straight cane    Gait Pattern Step-through pattern    Stairs Yes    Stairs Assistance 6: Modified independent (Device/Increase time)    Stair Management Technique --   step to with SPC and cam boot   Number of Stairs 5     Height of Stairs 6    Gait Comments gait in CAM boot      Knee/Hip Exercises: Sidelying   Clams 2 x 15; green band bilateral      Manual Therapy   Other Manual Therapy scar tissue mobilization, STM peroneals and gastroc/soleus, passive calf strethcing and great toe flexors      Ankle Exercises: Supine   Isometrics inversion and dorsiflexion 1 x 5 each; attempted eversion (pain)                    PT Education - 04/24/21 1227     Education Details Utilize [facility] for walking    Person(s) Educated Patient    Methods Explanation    Comprehension Verbalized understanding              PT Short Term Goals - 04/24/21 1210       PT SHORT TERM GOAL #1   Title Patient  will be independent and compliant with established HEP.    Status Achieved      PT SHORT TERM GOAL #2   Title Patient will ambulate without axillary crutches.    Baseline trialed SPC today    Status On-going      PT SHORT TERM GOAL #3   Title Patient will demonstrate at least 10 degrees of Lt ankle dorsiflexion AROM to improve gait mechanics.    Baseline 9 deg 04/24/21    Status On-going               PT Long Term Goals - 04/04/21 1651       PT LONG TERM GOAL #1   Title Patient will score at least 45% function on FOTO to signify clinically meaningful improvement in functional abilities.    Baseline 21    Time 8    Period Weeks    Status New    Target Date 05/30/21      PT LONG TERM GOAL #2   Title Patient will demonstrate 5/5 Lt hip strength to improve stability about the chain for prolonged walking.    Baseline see flowsheet    Time 8    Period Weeks    Status New    Target Date 05/30/21      PT LONG TERM GOAL #3   Title Patient will demonstrate SLS on the LLE for at least 5 seconds to improve stability when walking on uneven terrain.    Baseline unable    Time 8    Period Weeks    Status New      PT LONG TERM GOAL #4   Title Patient will demonstrate normalized gait pattern  with LRAD once cleared by surgeon to discontinue CAM boot.    Baseline antalgic gait with CAM boot    Time 8    Period Weeks    Status New    Target Date 05/30/21      PT LONG TERM GOAL #5   Title Patient will demonstrate 5/5 Lt ankle strength to improve stability necessary for community ambulation    Baseline not assessed    Time 8    Period Weeks    Status New    Target Date 05/30/21      Additional Long Term Goals   Additional Long Term Goals Yes      PT LONG TERM GOAL #6   Title Patient will report <2/10 pain with prolonged walking/standing activity.    Baseline 8-9/10    Time 8    Period Weeks    Status New    Target Date 05/30/21                   Plan - 04/24/21 1210     Clinical Impression Statement Patient arrives without reports of pain and has been consistently using single axillary crutch and CAM boot for ambulation, though feels as though the single crutch is awkward. Trialed [facility] today with patient demonstrating improved stride and felt more stable compared to walking with axillary crutches. She was encouraged to begin utilizing cane for ambulation at this time. Her DF AROM has improved compared to evaluation acheiving 9 degrees today. Overall good tolerance to today's session focusing on gait training and strengthening with the exception of isometric eversion as this caused pain.    PT Treatment/Interventions ADLs/Self Care Home Management;Cryotherapy;Software engineer;Therapeutic activities;Therapeutic exercise;Balance training;Neuromuscular re-education;Patient/family education;Manual techniques;Passive range of motion;Dry needling;Taping;Vasopneumatic Device;Stair training    PT Next  Visit Plan continue L hip/knee strengthening and manual therapy only for L ankle, wean from AD but remaining PWB in CAM boot; see precautions    PT Home Exercise Plan Access Code [email or web address], added desensitization and isometric ankle eversion.     Consulted and Agree with Plan of Care Patient             Patient will benefit from skilled therapeutic intervention in order to improve the following deficits and impairments:  Abnormal gait, Decreased range of motion, Difficulty walking, Decreased endurance, Decreased activity tolerance, Pain, Decreased balance, Impaired flexibility, Decreased strength  Visit Diagnosis: Status post left foot surgery  Pain in left foot  Difficulty in walking, not elsewhere classified  Muscle weakness (generalized)  Stiffness of left ankle, not elsewhere classified     Problem List Patient Active Problem List   Diagnosis Date Noted   Allergic rhinitis 03/15/2021   Allergic rhinitis due to animal (cat) (dog) hair and dander 03/15/2021   Allergic rhinitis due to pollen 03/15/2021   Gastro-esophageal reflux disease without esophagitis 03/15/2021   Mild persistent asthma, uncomplicated 41/93/7902   Aortic atherosclerosis ([place]) by Chest CT scan on 02/02/2018  01/10/2021   Asthma    Low back pain 03/17/2019   Lumbosacral spondylosis without myelopathy 03/17/2019   Cubital tunnel syndrome 10/14/2018   Closed fracture of distal end of right radius 07/09/2018   Pseudogout 12/09/2017   Major depressive disorder, recurrent, severe without psychotic features ([place])    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   Suicide attempt by drug ingestion ([place])    Medication management 08/22/2014   Vitamin D deficiency 08/22/2014   Hyperlipidemia, mixed 08/22/2014   Hypertension    Anxiety    Abnormal glucose    Class 2 severe obesity due to excess calories with serious comorbidity and body mass index (BMI) of 37.0 to 37.9 in adult [facility])    Gwendolyn Grant, PT, DPT, ATC 04/24/21 12:47 PM   [facility] [address] [place], [place], [address] Phone: [phone]   Fax:  [phone]  Name: CYRENE GHARIBIAN MRN: [id number] Date of Birth: May 26, 1956

## 2021-04-26 ENCOUNTER — Other Ambulatory Visit: Payer: Self-pay

## 2021-04-26 ENCOUNTER — Ambulatory Visit: Payer: 59

## 2021-04-26 DIAGNOSIS — M6281 Muscle weakness (generalized): Secondary | ICD-10-CM

## 2021-04-26 DIAGNOSIS — Z9889 Other specified postprocedural states: Secondary | ICD-10-CM | POA: Diagnosis not present

## 2021-04-26 DIAGNOSIS — R262 Difficulty in walking, not elsewhere classified: Secondary | ICD-10-CM

## 2021-04-26 DIAGNOSIS — M79672 Pain in left foot: Secondary | ICD-10-CM

## 2021-04-26 DIAGNOSIS — M25672 Stiffness of left ankle, not elsewhere classified: Secondary | ICD-10-CM

## 2021-04-26 NOTE — Therapy (Deleted)
[place], [place], [address] Phone: [phone]   Fax:  [phone]  Physical Therapy Treatment  Patient Details  Name: Haley White MRN: [id number] Date of Birth: [birth year] Referring Provider (PT): Trula Slade, [place]   Encounter Date: 04/26/2021   PT End of Session - 04/26/21 1147     Visit Number 6    Number of Visits 17    Date for PT Re-Evaluation 06/16/21    Authorization Type [employer]: 30 VL    PT Start Time 1147    PT Stop Time 1230    PT Time Calculation (min) 43 min    Activity Tolerance Patient tolerated treatment well    Behavior During Therapy [facility] for tasks assessed/performed             Past Medical History:  Diagnosis Date   Anxiety    Arthritis    Asthma    Cataract    Gout    Hyperlipidemia    Hypertension    Obese     Past Surgical History:  Procedure Laterality Date   APPENDECTOMY  1984   CATARACT EXTRACTION W/ INTRAOCULAR LENS  IMPLANT, BILATERAL  2014   CESAREAN SECTION  1984, 1987, 1994   X3   COLONOSCOPY  11/16/2013   w/Brodie    FOOT SURGERY Left 2002   [place] Right 2007   TUBAL LIGATION  1994    There were no vitals filed for this visit.   Subjective Assessment - 04/26/21 1149     Subjective Patient reports she is doing well without pain. She had f/u with Dr. Jacqualyn Posey on Tuesday who has instructed patient that she can start to bring her regular shoe to PT to begin WBAT without the CAM boot. She has f/u with Dr. Jacqualyn Posey on 7/26.    Currently in Pain? No/denies                [facility] PT Assessment - 04/26/21 0001       Observation/Other Assessments   Focus on Therapeutic Outcomes (FOTO)  59% function                           [facility] Adult PT Treatment/Exercise - 04/26/21 0001       Ambulation/Gait   Ambulation Distance (Feet) 185 Feet    Assistive device Other (Comment)   CAM  boot   Gait Pattern Step-through pattern    Ambulation Surface Level    Gait Comments pain along dorsum of foot and lateral ankle 1/2 way through walk      Self-Care   Self-Care Other Self-Care Comments    Other Self-Care Comments  see patient education      Manual Therapy   Other Manual Therapy STM/Trigger point release to Lt peroneals and anterior tib      Ankle Exercises: Stretches   Soleus Stretch 30 seconds   x2   Gastroc Stretch 30 seconds   x2     Ankle Exercises: Supine   Isometrics iversion and eversion 2 x 10; 5 sec hold    T-Band PF/DF 1 x 10 each red band    Other Supine Ankle Exercises plantarflexion, dorsiflexion AROM 1 x 10 Lt                    PT Education - 04/26/21 1216     Education Details  Education on updated HEP, use of SPC for community ambulation and begin walking without AD for household ambulation (continue use of CAM boot)    Person(s) Educated Patient    Methods Explanation;Demonstration;Verbal cues;Handout    Comprehension Verbalized understanding;Returned demonstration;Verbal cues required              PT Short Term Goals - 04/24/21 1210       PT SHORT TERM GOAL #1   Title Patient will be independent and compliant with established HEP.    Status Achieved      PT SHORT TERM GOAL #2   Title Patient will ambulate without axillary crutches.    Baseline trialed SPC today    Status On-going      PT SHORT TERM GOAL #3   Title Patient will demonstrate at least 10 degrees of Lt ankle dorsiflexion AROM to improve gait mechanics.    Baseline 9 deg 04/24/21    Status On-going               PT Long Term Goals - 04/04/21 1651       PT LONG TERM GOAL #1   Title Patient will score at least 45% function on FOTO to signify clinically meaningful improvement in functional abilities.    Baseline 21    Time 8    Period Weeks    Status New    Target Date 05/30/21      PT LONG TERM GOAL #2   Title Patient will demonstrate 5/5 Lt  hip strength to improve stability about the chain for prolonged walking.    Baseline see flowsheet    Time 8    Period Weeks    Status New    Target Date 05/30/21      PT LONG TERM GOAL #3   Title Patient will demonstrate SLS on the LLE for at least 5 seconds to improve stability when walking on uneven terrain.    Baseline unable    Time 8    Period Weeks    Status New      PT LONG TERM GOAL #4   Title Patient will demonstrate normalized gait pattern with LRAD once cleared by surgeon to discontinue CAM boot.    Baseline antalgic gait with CAM boot    Time 8    Period Weeks    Status New    Target Date 05/30/21      PT LONG TERM GOAL #5   Title Patient will demonstrate 5/5 Lt ankle strength to improve stability necessary for community ambulation    Baseline not assessed    Time 8    Period Weeks    Status New    Target Date 05/30/21      Additional Long Term Goals   Additional Long Term Goals Yes      PT LONG TERM GOAL #6   Title Patient will report <2/10 pain with prolonged walking/standing activity.    Baseline 8-9/10    Time 8    Period Weeks    Status New    Target Date 05/30/21                   Plan - 04/26/21 1152     Clinical Impression Statement Patient arrives without reports of pain and at recent f/u with Dr. Jacqualyn Posey was instructed to begin WBAT without CAM boot during PT sessions. Her FOTO score has much improved compared to baseline having surpassed predicted outcome score. Trialed walking in clinic  without SPC (CAM boot donned) with patient reporting mild discomfort about dorsal and lateral aspect of the foot after about 80 ft. It was recommended that she continue use of SPC with community ambulation and can begin household ambulation without SPC, though continue wearing CAM boot. Good tolerance to inversion and eversion isometrics and resisted plantarflexion and dorsiflexion reporting pinching sensation about lateral ankle on rep 10 of second set  with isometric eversion otherwise no issues with strengthening exercises.    PT Treatment/Interventions ADLs/Self Care Home Management;Cryotherapy;Software engineer;Therapeutic activities;Therapeutic exercise;Balance training;Neuromuscular re-education;Patient/family education;Manual techniques;Passive range of motion;Dry needling;Taping;Vasopneumatic Device;Stair training    PT Next Visit Plan continue L hip/knee strengthening and manual therapy only for L ankle, ***per patient's recent f/u with Dr Jacqualyn Posey can begin WBAT without CAM boot during PT sessions***    PT Home Exercise Plan Access Code [email or web address], added desensitization and isometric ankle eversion.    Consulted and Agree with Plan of Care Patient             Patient will benefit from skilled therapeutic intervention in order to improve the following deficits and impairments:  Abnormal gait, Decreased range of motion, Difficulty walking, Decreased endurance, Decreased activity tolerance, Pain, Decreased balance, Impaired flexibility, Decreased strength  Visit Diagnosis: Status post left foot surgery  Pain in left foot  Difficulty in walking, not elsewhere classified  Stiffness of left ankle, not elsewhere classified  Muscle weakness (generalized)     Problem List Patient Active Problem List   Diagnosis Date Noted   Allergic rhinitis 03/15/2021   Allergic rhinitis due to animal (cat) (dog) hair and dander 03/15/2021   Allergic rhinitis due to pollen 03/15/2021   Gastro-esophageal reflux disease without esophagitis 03/15/2021   Mild persistent asthma, uncomplicated 53/64/6803   Aortic atherosclerosis ([place]) by Chest CT scan on 02/02/2018  01/10/2021   Asthma    Low back pain 03/17/2019   Lumbosacral spondylosis without myelopathy 03/17/2019   Cubital tunnel syndrome 10/14/2018   Closed fracture of distal end of right radius 07/09/2018   Pseudogout 12/09/2017   Major depressive disorder,  recurrent, severe without psychotic features ([place])    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 [address] [place], [place], [address] Phone: [phone]   Fax:  [phone]  Name: DARTHY MANGANELLI MRN: [id number] Date of Birth: 06/19/1956

## 2021-04-26 NOTE — Therapy (Signed)
[place], [place], [address] Phone: [phone]   Fax:  [phone]  Physical Therapy Treatment  Patient Details  Name: Haley White MRN: [id number] Date of Birth: 09-20-1956 Referring Provider (PT): Trula Slade, [place]   Encounter Date: 04/26/2021   PT End of Session - 04/26/21 1147     Visit Number 6    Number of Visits 17    Date for PT Re-Evaluation 06/16/21    Authorization Type [employer]: 30 VL    PT Start Time 1147    PT Stop Time 1230    PT Time Calculation (min) 43 min    Activity Tolerance Patient tolerated treatment well    Behavior During Therapy [facility] for tasks assessed/performed             Past Medical History:  Diagnosis Date   Anxiety    Arthritis    Asthma    Cataract    Gout    Hyperlipidemia    Hypertension    Obese     Past Surgical History:  Procedure Laterality Date   APPENDECTOMY  1984   CATARACT EXTRACTION W/ INTRAOCULAR LENS  IMPLANT, BILATERAL  2014   CESAREAN SECTION  1984, 1987, 1994   X3   COLONOSCOPY  11/16/2013   w/Brodie    FOOT SURGERY Left 2002   [place] Right 2007   TUBAL LIGATION  1994    There were no vitals filed for this visit.   Subjective Assessment - 04/26/21 1149     Subjective Patient reports she is doing well without pain. She had f/u with Dr. Jacqualyn Posey on Tuesday who has instructed patient that she can start to bring her regular shoe to PT to begin WBAT without the CAM boot. She has f/u with Dr. Jacqualyn Posey on 7/26.    Currently in Pain? No/denies                [facility] PT Assessment - 04/26/21 0001       Observation/Other Assessments   Focus on Therapeutic Outcomes (FOTO)  59% function                           [facility] Adult PT Treatment/Exercise - 04/26/21 0001       Ambulation/Gait   Ambulation Distance (Feet) 185 Feet    Assistive device Other (Comment)   CAM  boot   Gait Pattern Step-through pattern    Ambulation Surface Level    Gait Comments pain along dorsum of foot and lateral ankle 1/2 way through walk      Self-Care   Self-Care Other Self-Care Comments    Other Self-Care Comments  see patient education      Manual Therapy   Other Manual Therapy STM/Trigger point release to Lt peroneals and anterior tib      Ankle Exercises: Stretches   Soleus Stretch 30 seconds   x2   Gastroc Stretch 30 seconds   x2     Ankle Exercises: Supine   Isometrics iversion and eversion 2 x 10; 5 sec hold    T-Band PF/DF 1 x 10 each red band    Other Supine Ankle Exercises plantarflexion, dorsiflexion AROM 1 x 10 Lt                    PT Education - 04/26/21 1216     Education Details  Education on updated HEP, use of SPC for community ambulation and begin walking without AD for household ambulation (continue use of CAM boot)    Person(s) Educated Patient    Methods Explanation;Demonstration;Verbal cues;Handout    Comprehension Verbalized understanding;Returned demonstration;Verbal cues required              PT Short Term Goals - 04/24/21 1210       PT SHORT TERM GOAL #1   Title Patient will be independent and compliant with established HEP.    Status Achieved      PT SHORT TERM GOAL #2   Title Patient will ambulate without axillary crutches.    Baseline trialed SPC today    Status On-going      PT SHORT TERM GOAL #3   Title Patient will demonstrate at least 10 degrees of Lt ankle dorsiflexion AROM to improve gait mechanics.    Baseline 9 deg 04/24/21    Status On-going               PT Long Term Goals - 04/04/21 1651       PT LONG TERM GOAL #1   Title Patient will score at least 45% function on FOTO to signify clinically meaningful improvement in functional abilities.    Baseline 21    Time 8    Period Weeks    Status New    Target Date 05/30/21      PT LONG TERM GOAL #2   Title Patient will demonstrate 5/5 Lt  hip strength to improve stability about the chain for prolonged walking.    Baseline see flowsheet    Time 8    Period Weeks    Status New    Target Date 05/30/21      PT LONG TERM GOAL #3   Title Patient will demonstrate SLS on the LLE for at least 5 seconds to improve stability when walking on uneven terrain.    Baseline unable    Time 8    Period Weeks    Status New      PT LONG TERM GOAL #4   Title Patient will demonstrate normalized gait pattern with LRAD once cleared by surgeon to discontinue CAM boot.    Baseline antalgic gait with CAM boot    Time 8    Period Weeks    Status New    Target Date 05/30/21      PT LONG TERM GOAL #5   Title Patient will demonstrate 5/5 Lt ankle strength to improve stability necessary for community ambulation    Baseline not assessed    Time 8    Period Weeks    Status New    Target Date 05/30/21      Additional Long Term Goals   Additional Long Term Goals Yes      PT LONG TERM GOAL #6   Title Patient will report <2/10 pain with prolonged walking/standing activity.    Baseline 8-9/10    Time 8    Period Weeks    Status New    Target Date 05/30/21                   Plan - 04/26/21 1152     Clinical Impression Statement Patient arrives without reports of pain and at recent f/u with Dr. Jacqualyn Posey was instructed to begin WBAT without CAM boot during PT sessions. Her FOTO score has much improved compared to baseline having surpassed predicted outcome score. Trialed walking in clinic  without SPC (CAM boot donned) with patient reporting mild discomfort about dorsal and lateral aspect of the foot after about 80 ft. It was recommended that she continue use of SPC with community ambulation and can begin household ambulation without SPC, though continue wearing CAM boot. Good tolerance to inversion and eversion isometrics and resisted plantarflexion and dorsiflexion reporting pinching sensation about lateral ankle on rep 10 of second set  with isometric eversion otherwise no issues with strengthening exercises.    PT Treatment/Interventions ADLs/Self Care Home Management;Cryotherapy;Software engineer;Therapeutic activities;Therapeutic exercise;Balance training;Neuromuscular re-education;Patient/family education;Manual techniques;Passive range of motion;Dry needling;Taping;Vasopneumatic Device;Stair training    PT Next Visit Plan continue L hip/knee strengthening and manual therapy only for L ankle, *per patient's recent f/u with Dr Jacqualyn Posey can begin WBAT without CAM boot during PT sessions*   PT Home Exercise Plan Access Code [email or web address], added desensitization and isometric ankle eversion.    Consulted and Agree with Plan of Care Patient             Patient will benefit from skilled therapeutic intervention in order to improve the following deficits and impairments:  Abnormal gait, Decreased range of motion, Difficulty walking, Decreased endurance, Decreased activity tolerance, Pain, Decreased balance, Impaired flexibility, Decreased strength  Visit Diagnosis: Status post left foot surgery  Pain in left foot  Difficulty in walking, not elsewhere classified  Stiffness of left ankle, not elsewhere classified  Muscle weakness (generalized)     Problem List Patient Active Problem List   Diagnosis Date Noted   Allergic rhinitis 03/15/2021   Allergic rhinitis due to animal (cat) (dog) hair and dander 03/15/2021   Allergic rhinitis due to pollen 03/15/2021   Gastro-esophageal reflux disease without esophagitis 03/15/2021   Mild persistent asthma, uncomplicated 57/32/2025   Aortic atherosclerosis ([place]) by Chest CT scan on 02/02/2018  01/10/2021   Asthma    Low back pain 03/17/2019   Lumbosacral spondylosis without myelopathy 03/17/2019   Cubital tunnel syndrome 10/14/2018   Closed fracture of distal end of right radius 07/09/2018   Pseudogout 12/09/2017   Major depressive disorder,  recurrent, severe without psychotic features ([place])    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 [place], [place], [address] Phone: [phone]   Fax:  [phone]  Name: Haley White MRN: [id number] Date of Birth: 05/06/1956

## 2021-05-01 ENCOUNTER — Other Ambulatory Visit: Payer: Self-pay

## 2021-05-01 ENCOUNTER — Ambulatory Visit: Payer: 59

## 2021-05-01 DIAGNOSIS — Z9889 Other specified postprocedural states: Secondary | ICD-10-CM

## 2021-05-01 DIAGNOSIS — M79672 Pain in left foot: Secondary | ICD-10-CM

## 2021-05-01 DIAGNOSIS — R262 Difficulty in walking, not elsewhere classified: Secondary | ICD-10-CM

## 2021-05-01 DIAGNOSIS — M25672 Stiffness of left ankle, not elsewhere classified: Secondary | ICD-10-CM

## 2021-05-01 DIAGNOSIS — M6281 Muscle weakness (generalized): Secondary | ICD-10-CM

## 2021-05-01 NOTE — Therapy (Signed)
[place], [place], [address] Phone: [phone]   Fax:  [phone]  Physical Therapy Treatment  Patient Details  Name: KATLYNE NISHIDA MRN: [id number] Date of Birth: 03/25/1956 Referring Provider (PT): Trula Slade, [place]   Encounter Date: 05/01/2021   PT End of Session - 05/01/21 1145     Visit Number 7    Number of Visits 17    Date for PT Re-Evaluation 06/16/21    Authorization Type [employer]: 30 VL    PT Start Time 1146    PT Stop Time 1229    PT Time Calculation (min) 43 min    Activity Tolerance Patient tolerated treatment well    Behavior During Therapy [facility] for tasks assessed/performed             Past Medical History:  Diagnosis Date   Anxiety    Arthritis    Asthma    Cataract    Gout    Hyperlipidemia    Hypertension    Obese     Past Surgical History:  Procedure Laterality Date   APPENDECTOMY  1984   CATARACT EXTRACTION W/ INTRAOCULAR LENS  IMPLANT, BILATERAL  2014   CESAREAN SECTION  1984, 1987, 1994   X3   COLONOSCOPY  11/16/2013   w/Brodie    FOOT SURGERY Left 2002   [place] Right 2007   TUBAL LIGATION  1994    There were no vitals filed for this visit.   Subjective Assessment - 05/01/21 1149     Subjective Patient reports going without the cane more. She has pain along the top of the foot when she goes without the cane that begin within about 3-4 minutes of walking. She reports compliance with HEP, but has some pain on the top of the foot with one of the band exercises.    Currently in Pain? Yes    Pain Score 4     Pain Location Ankle    Pain Orientation Left   dorsal   Pain Descriptors / Indicators --   pulling, strain   Pain Type Surgical pain                [facility] PT Assessment - 05/01/21 0001       AROM   Left Ankle Dorsiflexion 13                           [facility] Adult PT  Treatment/Exercise - 05/01/21 0001       Ambulation/Gait   Pre-Gait Activities lateral weight shifts 1 x 20; A/P weight shifts attempted pain, step taps with left foot leading onto 4 inch step focusing on heel strike and foot flat 1 x 10    Gait Comments tennis shoe donned for standing activity      Self-Care   Other Self-Care Comments  see patient education      Manual Therapy   Other Manual Therapy metatarsal glides, MTP mobilization 1-4, passive dorsiflexion and plantarflexion stretch, passive toe flexion/extension stretching      Ankle Exercises: Seated   Other Seated Ankle Exercises toe spreading 1 x 10; arch lift 2 x 10    Other Seated Ankle Exercises resisted ankle dorsiflexion yellow band 1 x 10                    PT Education - 05/01/21 1253  Education Details Updated HEP. Recommended to continue use of [facility] for community ambulation and continue with CAM boot at all times with ambulation. Begin wearing a shoe for short bouts while seated at home.    Person(s) Educated Patient    Methods Explanation;Demonstration;Verbal cues;Tactile cues;Handout    Comprehension Verbalized understanding;Returned demonstration;Verbal cues required              PT Short Term Goals - 05/01/21 1211       PT SHORT TERM GOAL #1   Title Patient will be independent and compliant with established HEP.    Status Achieved      PT SHORT TERM GOAL #2   Title Patient will ambulate without axillary crutches.    Baseline occasional use of SPC    Status Achieved      PT SHORT TERM GOAL #3   Title Patient will demonstrate at least 10 degrees of Lt ankle dorsiflexion AROM to improve gait mechanics.    Baseline 13 deg 05/01/21    Status Achieved               PT Long Term Goals - 04/04/21 1651       PT LONG TERM GOAL #1   Title Patient will score at least 45% function on FOTO to signify clinically meaningful improvement in functional abilities.    Baseline 21    Time 8     Period Weeks    Status New    Target Date 05/30/21      PT LONG TERM GOAL #2   Title Patient will demonstrate 5/5 Lt hip strength to improve stability about the chain for prolonged walking.    Baseline see flowsheet    Time 8    Period Weeks    Status New    Target Date 05/30/21      PT LONG TERM GOAL #3   Title Patient will demonstrate SLS on the LLE for at least 5 seconds to improve stability when walking on uneven terrain.    Baseline unable    Time 8    Period Weeks    Status New      PT LONG TERM GOAL #4   Title Patient will demonstrate normalized gait pattern with LRAD once cleared by surgeon to discontinue CAM boot.    Baseline antalgic gait with CAM boot    Time 8    Period Weeks    Status New    Target Date 05/30/21      PT LONG TERM GOAL #5   Title Patient will demonstrate 5/5 Lt ankle strength to improve stability necessary for community ambulation    Baseline not assessed    Time 8    Period Weeks    Status New    Target Date 05/30/21      Additional Long Term Goals   Additional Long Term Goals Yes      PT LONG TERM GOAL #6   Title Patient will report <2/10 pain with prolonged walking/standing activity.    Baseline 8-9/10    Time 8    Period Weeks    Status New    Target Date 05/30/21                   Plan - 05/01/21 1205     Clinical Impression Statement Patient is making good functional progress having met all short term functional goals at this time. She arrives with pain about the left dorsal aspect of the foot along  the MTP joint. Manual therapy focused on improving mobility of this joint with notable hypomobility present about the metatarsals. Session focused on intrinsic foot strengthening and introduction to weight bearing activity in tennis shoe as instructed by Dr. Jacqualyn Posey at patient's recent follow-up. She was able to complete lateral weight shifts without increased dorsal foot pain, though unable to complete anterior/posterior weight  shifts due to increased pain in this region. Reviewed resisted dorsiflexion as patient reports this exercise was causing more pain as part of her HEP. She was completing incorrectly, so was able correct with patient reporting no pain. Recommended to continue use of SPC for community ambulation and continue with CAM boot at all times with ambulation with patient verbalizing understanding. Pain reduced at end of session rated as 2/10.    PT Treatment/Interventions ADLs/Self Care Home Management;Cryotherapy;Software engineer;Therapeutic activities;Therapeutic exercise;Balance training;Neuromuscular re-education;Patient/family education;Manual techniques;Passive range of motion;Dry needling;Taping;Vasopneumatic Device;Stair training    PT Next Visit Plan continue L hip/knee strengthening and manual therapy only for L ankle, *per patient's recent f/u with Dr Jacqualyn Posey can begin WBAT without CAM boot during PT sessions*    PT Home Exercise Plan Access Code [email or web address], added desensitization and isometric ankle eversion.    Consulted and Agree with Plan of Care Patient             Patient will benefit from skilled therapeutic intervention in order to improve the following deficits and impairments:  Abnormal gait, Decreased range of motion, Difficulty walking, Decreased endurance, Decreased activity tolerance, Pain, Decreased balance, Impaired flexibility, Decreased strength  Visit Diagnosis: Status post left foot surgery  Pain in left foot  Difficulty in walking, not elsewhere classified  Stiffness of left ankle, not elsewhere classified  Muscle weakness (generalized)     Problem List Patient Active Problem List   Diagnosis Date Noted   Allergic rhinitis 03/15/2021   Allergic rhinitis due to animal (cat) (dog) hair and dander 03/15/2021   Allergic rhinitis due to pollen 03/15/2021   Gastro-esophageal reflux disease without esophagitis 03/15/2021   Mild persistent  asthma, uncomplicated 68/25/7493   Aortic atherosclerosis ([place]) by Chest CT scan on 02/02/2018  01/10/2021   Asthma    Low back pain 03/17/2019   Lumbosacral spondylosis without myelopathy 03/17/2019   Cubital tunnel syndrome 10/14/2018   Closed fracture of distal end of right radius 07/09/2018   Pseudogout 12/09/2017   Major depressive disorder, recurrent, severe without psychotic features ([place])    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 [address] [place], [place], [address] Phone: [phone]   Fax:  [phone]  Name: KYLLIE PETTIJOHN MRN: [id number] Date of Birth: 1956-06-01

## 2021-05-02 NOTE — Progress Notes (Signed)
Subjective: ROSITA GUZZETTA is a 65 y.o. is seen today in office s/p left foot exostectomy, repair posterior tibial tendon, peroneal tendon with gastrocnemius recession preformed on 01/31/2021.  She states overall she is feeling better and physical therapy has been helpful for her.  She does feel the lateral aspect receiving the majority discomfort is doing somewhat better.  She presents today walking in the boot.  Objective: General: No acute distress, AAOx3  DP/PT pulses palpable 2/4, CRT < 3 sec to all digits.  Protective sensation intact. Motor function intact.  LEFT foot: Incision is well coapted without any evidence of dehiscence.  She discomfort physical therapy there is mild edema on the course the peroneal tendon discomfort but overall does seem to be somewhat improved.  There is no area of pinpoint tenderness.  Flexor, extensor tendons appear to be intact.  There is no discomfort of the other surgical sites. No pain with calf compression, swelling, warmth, erythema.   Assessment and Plan:  Status post left foot surgery-improving  -Treatment options discussed including all alternatives, risks, and complications -Overall she is improving with physical therapy.  She is walking in the cam boot.  We discussed that as she progresses with physical therapy she can start to wean off the boot and try to go back into regular shoe as her pain improves as well as the strength improving.  She is getting ready go to [place] on vacation she does not want to try this before that.  When she gets back from her vacation we will need to restart physical therapy and start to wean out of the boot.  Trula Slade DPM

## 2021-05-03 ENCOUNTER — Ambulatory Visit: Payer: 59

## 2021-05-03 ENCOUNTER — Other Ambulatory Visit: Payer: Self-pay

## 2021-05-03 DIAGNOSIS — M79672 Pain in left foot: Secondary | ICD-10-CM

## 2021-05-03 DIAGNOSIS — Z9889 Other specified postprocedural states: Secondary | ICD-10-CM | POA: Diagnosis not present

## 2021-05-03 DIAGNOSIS — M6281 Muscle weakness (generalized): Secondary | ICD-10-CM

## 2021-05-03 DIAGNOSIS — M25672 Stiffness of left ankle, not elsewhere classified: Secondary | ICD-10-CM

## 2021-05-03 DIAGNOSIS — R262 Difficulty in walking, not elsewhere classified: Secondary | ICD-10-CM

## 2021-05-03 NOTE — Therapy (Signed)
[place], [place], [address] Phone: [phone]   Fax:  [phone]  Physical Therapy Treatment  Patient Details  Name: Haley White MRN: [id number] Date of Birth: [birth year] Referring Provider (PT): Trula Slade, [place]   Encounter Date: 05/03/2021   PT End of Session - 05/03/21 1144     Visit Number 8    Number of Visits 17    Date for PT Re-Evaluation 06/16/21    Authorization Type [employer]: 30 VL    PT Start Time 9924    PT Stop Time 1230    PT Time Calculation (min) 45 min    Activity Tolerance Patient tolerated treatment well    Behavior During Therapy [facility] for tasks assessed/performed             Past Medical History:  Diagnosis Date   Anxiety    Arthritis    Asthma    Cataract    Gout    Hyperlipidemia    Hypertension    Obese     Past Surgical History:  Procedure Laterality Date   APPENDECTOMY  1984   CATARACT EXTRACTION W/ INTRAOCULAR LENS  IMPLANT, BILATERAL  2014   CESAREAN SECTION  1984, 1987, 1994   X3   COLONOSCOPY  11/16/2013   w/Brodie    FOOT SURGERY Left 2002   KNEE SURGERY Left 1998   ROTATOR CUFF REPAIR Right 2007   TUBAL LIGATION  1994    There were no vitals filed for this visit.   Subjective Assessment - 05/03/21 1148     Subjective Patient reports she is doing well. No pain currently. Getting some pain on the top of the foot and side of the foot with foot intrinsic strengthening as part of HEP.    Currently in Pain? No/denies                               [facility] Adult PT Treatment/Exercise - 05/03/21 0001       Knee/Hip Exercises: Aerobic   Nustep 2 minutes level 5 LE only      Manual Therapy   Manual Therapy Edema management    Edema Management K-tape fan shape to lateral malleoli/sinus tarsi    Other Manual Therapy joint mobilizations to improve dorsiflexion      Ankle Exercises: Seated   Towel Inversion/Eversion  2 reps   inversion only   [place] Pickup 3 x 10    Other Seated Ankle Exercises ankle rockerboard A/P 2 x 10      Ankle Exercises: Standing   Other Standing Ankle Exercises semi tandem 20 sec each    Other Standing Ankle Exercises lateral weight shifts 1 x 10                    PT Education - 05/03/21 1156     Education Details Discontinue HEP exercises that are painful currently. Reviewed resisted dorsiflexion as part of HEP.    Person(s) Educated Patient    Methods Explanation;Demonstration    Comprehension Verbalized understanding              PT Short Term Goals - 05/01/21 1211       PT SHORT TERM GOAL #1   Title Patient will be independent and compliant with established HEP.    Status Achieved      PT SHORT TERM GOAL #2   Title Patient  will ambulate without axillary crutches.    Baseline occasional use of SPC    Status Achieved      PT SHORT TERM GOAL #3   Title Patient will demonstrate at least 10 degrees of Lt ankle dorsiflexion AROM to improve gait mechanics.    Baseline 13 deg 05/01/21    Status Achieved               PT Long Term Goals - 04/04/21 1651       PT LONG TERM GOAL #1   Title Patient will score at least 45% function on FOTO to signify clinically meaningful improvement in functional abilities.    Baseline 21    Time 8    Period Weeks    Status New    Target Date 05/30/21      PT LONG TERM GOAL #2   Title Patient will demonstrate 5/5 Lt hip strength to improve stability about the chain for prolonged walking.    Baseline see flowsheet    Time 8    Period Weeks    Status New    Target Date 05/30/21      PT LONG TERM GOAL #3   Title Patient will demonstrate SLS on the LLE for at least 5 seconds to improve stability when walking on uneven terrain.    Baseline unable    Time 8    Period Weeks    Status New      PT LONG TERM GOAL #4   Title Patient will demonstrate normalized gait pattern with LRAD once cleared by surgeon  to discontinue CAM boot.    Baseline antalgic gait with CAM boot    Time 8    Period Weeks    Status New    Target Date 05/30/21      PT LONG TERM GOAL #5   Title Patient will demonstrate 5/5 Lt ankle strength to improve stability necessary for community ambulation    Baseline not assessed    Time 8    Period Weeks    Status New    Target Date 05/30/21      Additional Long Term Goals   Additional Long Term Goals Yes      PT LONG TERM GOAL #6   Title Patient will report <2/10 pain with prolonged walking/standing activity.    Baseline 8-9/10    Time 8    Period Weeks    Status New    Target Date 05/30/21                   Plan - 05/03/21 1158     Clinical Impression Statement Mild swelling remains about sinus tarsi. K-tape applied today to assist in edema reduction. Patient able to complete 2 minutes on the NuStep prior to reporting a pinching sensation about superior aspect of lateral malleoli. Able to complete inversion towel slides with patient reporting slight discomfort during first attempt at exercise, though no complaints with continued reps. Able to complete static balance activity and intrinsic strengthening today without reports of pain.    PT Treatment/Interventions ADLs/Self Care Home Management;Cryotherapy;Software engineer;Therapeutic activities;Therapeutic exercise;Balance training;Neuromuscular re-education;Patient/family education;Manual techniques;Passive range of motion;Dry needling;Taping;Vasopneumatic Device;Stair training    PT Next Visit Plan continue L hip/knee strengthening and manual therapy only for L ankle, *per patient's recent f/u with Dr Jacqualyn Posey can begin WBAT without CAM boot during PT sessions*    PT Home Exercise Plan Access Code [email or web address], added desensitization and isometric ankle eversion.  Consulted and Agree with Plan of Care Patient             Patient will benefit from skilled therapeutic  intervention in order to improve the following deficits and impairments:  Abnormal gait, Decreased range of motion, Difficulty walking, Decreased endurance, Decreased activity tolerance, Pain, Decreased balance, Impaired flexibility, Decreased strength  Visit Diagnosis: Status post left foot surgery  Pain in left foot  Difficulty in walking, not elsewhere classified  Stiffness of left ankle, not elsewhere classified  Muscle weakness (generalized)     Problem List Patient Active Problem List   Diagnosis Date Noted   Allergic rhinitis 03/15/2021   Allergic rhinitis due to animal (cat) (dog) hair and dander 03/15/2021   Allergic rhinitis due to pollen 03/15/2021   Gastro-esophageal reflux disease without esophagitis 03/15/2021   Mild persistent asthma, uncomplicated 41/74/0814   Aortic atherosclerosis ([place]) by Chest CT scan on 02/02/2018  01/10/2021   Asthma    Low back pain 03/17/2019   Lumbosacral spondylosis without myelopathy 03/17/2019   Cubital tunnel syndrome 10/14/2018   Closed fracture of distal end of right radius 07/09/2018   Pseudogout 12/09/2017   Major depressive disorder, recurrent, severe without psychotic features (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 [place], [place], [address] Phone: [phone]   Fax:  [phone]  Name: CHRYSTEN WOULFE MRN: [id number] Date of Birth: 1956/04/13

## 2021-05-08 ENCOUNTER — Ambulatory Visit: Payer: 59 | Attending: Podiatry

## 2021-05-08 ENCOUNTER — Other Ambulatory Visit: Payer: Self-pay

## 2021-05-08 ENCOUNTER — Encounter: Payer: Self-pay | Admitting: Podiatry

## 2021-05-08 DIAGNOSIS — Z9889 Other specified postprocedural states: Secondary | ICD-10-CM | POA: Diagnosis present

## 2021-05-08 DIAGNOSIS — M6281 Muscle weakness (generalized): Secondary | ICD-10-CM | POA: Diagnosis present

## 2021-05-08 DIAGNOSIS — M25672 Stiffness of left ankle, not elsewhere classified: Secondary | ICD-10-CM | POA: Diagnosis present

## 2021-05-08 DIAGNOSIS — M79672 Pain in left foot: Secondary | ICD-10-CM | POA: Insufficient documentation

## 2021-05-08 DIAGNOSIS — R262 Difficulty in walking, not elsewhere classified: Secondary | ICD-10-CM | POA: Diagnosis present

## 2021-05-08 NOTE — Therapy (Signed)
[place], [place], [address] Phone: [phone]   Fax:  [phone]  Physical Therapy Treatment  Patient Details  Name: Haley White MRN: [id number] Date of Birth: Nov 07, 1955 Referring Provider (PT): Trula Slade, [place]   Encounter Date: 05/08/2021   PT End of Session - 05/08/21 1234     Visit Number 9    Number of Visits 17    Date for PT Re-Evaluation 06/16/21    Authorization Type [employer]: 30 VL    PT Start Time 1234    PT Stop Time 1325   gameready 10 minutes at end of session   PT Time Calculation (min) 51 min    Activity Tolerance Patient tolerated treatment well    Behavior During Therapy [facility] for tasks assessed/performed             Past Medical History:  Diagnosis Date   Anxiety    Arthritis    Asthma    Cataract    Gout    Hyperlipidemia    Hypertension    Obese     Past Surgical History:  Procedure Laterality Date   APPENDECTOMY  1984   CATARACT EXTRACTION W/ INTRAOCULAR LENS  IMPLANT, BILATERAL  2014   CESAREAN SECTION  1984, 1987, 1994   X3   COLONOSCOPY  11/16/2013   w/Brodie    FOOT SURGERY Left 2002   [place] Right 2007   TUBAL LIGATION  1994    There were no vitals filed for this visit.   Subjective Assessment - 05/08/21 1235     Subjective "I went to see fireworks last night and walked on uneven ground and that was a mistake. It has been hurting ever since." With the exception of walking on uneven terrain it has been ok. She is worried about her upcoming trip to [place] due to having increased pain from this walking event yesterday.    Currently in Pain? Yes    Pain Score 5     Pain Location Ankle    Pain Orientation Left;Lateral    Pain Descriptors / Indicators Burning   twisting   Pain Type Surgical pain    Pain Onset More than a month ago    Pain Frequency Intermittent                [facility] PT  Assessment - 05/08/21 0001       Palpation   Palpation comment TTP distal fibula (superior to malleoli)                           [facility] Adult PT Treatment/Exercise - 05/08/21 0001       Self-Care   Other Self-Care Comments  see patient education      Knee/Hip Exercises: Sidelying   Clams 2 x 15; blue band      Modalities   Modalities Cryotherapy;Vasopneumatic      Vasopneumatic   Number Minutes Vasopneumatic  10 minutes    Vasopnuematic Location  Ankle    Vasopneumatic Pressure Low    Vasopneumatic Temperature  38      Manual Therapy   Manual therapy comments STM peroneal musculature Lt    Other Manual Therapy joint mobilizations to improve dorsiflexion; fibular head posterior glides, distraction talocrural joint      Ankle Exercises: Seated   [place] Pickup 2 x 10    Other Seated  Ankle Exercises ankle rockerboard A/P 2 x 10                    PT Education - 05/08/21 1309     Education Details Education on utilizing wheelchair as needed during her trip in [place] to reduce overall stress with prolonged walking/standing on surgical foot    Person(s) Educated Patient    Methods Explanation    Comprehension Verbalized understanding              PT Short Term Goals - 05/01/21 1211       PT SHORT TERM GOAL #1   Title Patient will be independent and compliant with established HEP.    Status Achieved      PT SHORT TERM GOAL #2   Title Patient will ambulate without axillary crutches.    Baseline occasional use of SPC    Status Achieved      PT SHORT TERM GOAL #3   Title Patient will demonstrate at least 10 degrees of Lt ankle dorsiflexion AROM to improve gait mechanics.    Baseline 13 deg 05/01/21    Status Achieved               PT Long Term Goals - 04/04/21 1651       PT LONG TERM GOAL #1   Title Patient will score at least 45% function on FOTO to signify clinically meaningful improvement in functional abilities.     Baseline 21    Time 8    Period Weeks    Status New    Target Date 05/30/21      PT LONG TERM GOAL #2   Title Patient will demonstrate 5/5 Lt hip strength to improve stability about the chain for prolonged walking.    Baseline see flowsheet    Time 8    Period Weeks    Status New    Target Date 05/30/21      PT LONG TERM GOAL #3   Title Patient will demonstrate SLS on the LLE for at least 5 seconds to improve stability when walking on uneven terrain.    Baseline unable    Time 8    Period Weeks    Status New      PT LONG TERM GOAL #4   Title Patient will demonstrate normalized gait pattern with LRAD once cleared by surgeon to discontinue CAM boot.    Baseline antalgic gait with CAM boot    Time 8    Period Weeks    Status New    Target Date 05/30/21      PT LONG TERM GOAL #5   Title Patient will demonstrate 5/5 Lt ankle strength to improve stability necessary for community ambulation    Baseline not assessed    Time 8    Period Weeks    Status New    Target Date 05/30/21      Additional Long Term Goals   Additional Long Term Goals Yes      PT LONG TERM GOAL #6   Title Patient will report <2/10 pain with prolonged walking/standing activity.    Baseline 8-9/10    Time 8    Period Weeks    Status New    Target Date 05/30/21                   Plan - 05/08/21 1259     Clinical Impression Statement Patient arrives with increased reports of pain about the  lateral ankle attributed to walking on uneven terrain yesterday evening. No change in pain with manual therapy techniques. She tolerated gentle ankle ROM activity, foot intrinsic strengthening, and hip strengthening well without increased pain reported. Finished session with GameReady to assist in swelling and pain reduction.    PT Treatment/Interventions ADLs/Self Care Home Management;Cryotherapy;Software engineer;Therapeutic activities;Therapeutic exercise;Balance  training;Neuromuscular re-education;Patient/family education;Manual techniques;Passive range of motion;Dry needling;Taping;Vasopneumatic Device;Stair training    PT Next Visit Plan continue L hip/knee strengthening ankle strengthening and ROM as tolerated, slowly progress peroneals and posterior tib strengthening *per patient's recent f/u with Dr Jacqualyn Posey can begin WBAT without CAM boot during PT sessions*    PT Home Exercise Plan Access Code [email or web address], added desensitization and isometric ankle eversion.    Consulted and Agree with Plan of Care Patient             Patient will benefit from skilled therapeutic intervention in order to improve the following deficits and impairments:  Abnormal gait, Decreased range of motion, Difficulty walking, Decreased endurance, Decreased activity tolerance, Pain, Decreased balance, Impaired flexibility, Decreased strength  Visit Diagnosis: Status post left foot surgery  Pain in left foot  Difficulty in walking, not elsewhere classified  Stiffness of left ankle, not elsewhere classified  Muscle weakness (generalized)     Problem List Patient Active Problem List   Diagnosis Date Noted   Allergic rhinitis 03/15/2021   Allergic rhinitis due to animal (cat) (dog) hair and dander 03/15/2021   Allergic rhinitis due to pollen 03/15/2021   Gastro-esophageal reflux disease without esophagitis 03/15/2021   Mild persistent asthma, uncomplicated 01/75/1025   Aortic atherosclerosis ([place]) by Chest CT scan on 02/02/2018  01/10/2021   Asthma    Low back pain 03/17/2019   Lumbosacral spondylosis without myelopathy 03/17/2019   Cubital tunnel syndrome 10/14/2018   Closed fracture of distal end of right radius 07/09/2018   Pseudogout 12/09/2017   Major depressive disorder, recurrent, severe without psychotic features ([place])    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 [place], [place], [address] Phone: [phone]   Fax:  [phone]  Name: Haley White MRN: [id number] Date of Birth: 1956/01/08

## 2021-05-10 ENCOUNTER — Other Ambulatory Visit: Payer: Self-pay

## 2021-05-10 ENCOUNTER — Ambulatory Visit: Payer: 59

## 2021-05-10 DIAGNOSIS — R262 Difficulty in walking, not elsewhere classified: Secondary | ICD-10-CM

## 2021-05-10 DIAGNOSIS — Z9889 Other specified postprocedural states: Secondary | ICD-10-CM

## 2021-05-10 DIAGNOSIS — M6281 Muscle weakness (generalized): Secondary | ICD-10-CM

## 2021-05-10 DIAGNOSIS — M79672 Pain in left foot: Secondary | ICD-10-CM

## 2021-05-10 DIAGNOSIS — M25672 Stiffness of left ankle, not elsewhere classified: Secondary | ICD-10-CM

## 2021-05-10 NOTE — Therapy (Signed)
Haley White, [place], [address] Phone: [phone]   Fax:  [phone]  Physical Therapy Treatment  Patient Details  Name: Haley White MRN: [id number] Date of Birth: 1956-02-13 Referring Provider (PT): Trula Slade, [place]   Encounter Date: 05/10/2021   PT End of Session - 05/10/21 1236     Visit Number 10    Number of Visits 17    Date for PT Re-Evaluation 06/16/21    Authorization Type [employer]: 30 VL    PT Start Time 1234    PT Stop Time 1325   game ready 10 minutes at end of session   PT Time Calculation (min) 51 min    Activity Tolerance Patient tolerated treatment well    Behavior During Therapy [facility] for tasks assessed/performed             Past Medical History:  Diagnosis Date   Anxiety    Arthritis    Asthma    Cataract    Gout    Hyperlipidemia    Hypertension    Obese     Past Surgical History:  Procedure Laterality Date   APPENDECTOMY  1984   CATARACT EXTRACTION W/ INTRAOCULAR LENS  IMPLANT, BILATERAL  2014   CESAREAN SECTION  1984, 1987, 1994   X3   COLONOSCOPY  11/16/2013   w/Brodie    FOOT SURGERY Left 2002   [place] Right 2007   TUBAL LIGATION  1994    There were no vitals filed for this visit.   Subjective Assessment - 05/10/21 1236     Subjective Patient reports the foot is feeling better since [place], but still having pain that she attributes to being on her feet a lot yesterday.    Currently in Pain? Yes    Pain Score 2     Pain Location Ankle    Pain Orientation Left;Lateral    Pain Descriptors / Indicators --   twisting   Pain Type Surgical pain    Pain Onset More than a month ago    Pain Frequency Intermittent    Aggravating Factors  prolonged standing/walking    Pain Relieving Factors ice, rest                [facility] PT Assessment - 05/10/21 0001       Observation/Other Assessments   Focus on  Therapeutic Outcomes (FOTO)  56% function      AROM   Left Ankle Dorsiflexion 13    Left Ankle Inversion 10    Left Ankle Eversion 5   pain lateral ankle                          [facility] Adult PT Treatment/Exercise - 05/10/21 0001       Self-Care   Other Self-Care Comments  see patient education      Knee/Hip Exercises: Supine   Bridges 10 reps    Bridges Limitations x2    Straight Leg Raises 10 reps    Straight Leg Raises Limitations x2; LLE      Knee/Hip Exercises: Prone   Hip Extension 10 reps    Hip Extension Limitations x2; left      Vasopneumatic   Number Minutes Vasopneumatic  10 minutes    Vasopnuematic Location  Ankle    Vasopneumatic Pressure Low    Vasopneumatic Temperature  34  Manual Therapy   Manual therapy comments PROM to Lt ankle in all planes to tolerance      Ankle Exercises: Seated   Towel Inversion/Eversion 2 reps   inversion only   Other Seated Ankle Exercises resisted ankle inversion 2 x 10 yellow band LLE    Other Seated Ankle Exercises bean bag ankle lift to bucket 1  x10                    PT Education - 05/10/21 1302     Education Details Updated HEP.    Person(s) Educated Patient    Methods Explanation;Demonstration;Verbal cues;Handout    Comprehension Verbalized understanding;Returned demonstration              PT Short Term Goals - 05/01/21 1211       PT SHORT TERM GOAL #1   Title Patient will be independent and compliant with established HEP.    Status Achieved      PT SHORT TERM GOAL #2   Title Patient will ambulate without axillary crutches.    Baseline occasional use of SPC    Status Achieved      PT SHORT TERM GOAL #3   Title Patient will demonstrate at least 10 degrees of Lt ankle dorsiflexion AROM to improve gait mechanics.    Baseline 13 deg 05/01/21    Status Achieved               PT Long Term Goals - 04/04/21 1651       PT LONG TERM GOAL #1   Title Patient will score  at least 45% function on FOTO to signify clinically meaningful improvement in functional abilities.    Baseline 21    Time 8    Period Weeks    Status New    Target Date 05/30/21      PT LONG TERM GOAL #2   Title Patient will demonstrate 5/5 Lt hip strength to improve stability about the chain for prolonged walking.    Baseline see flowsheet    Time 8    Period Weeks    Status New    Target Date 05/30/21      PT LONG TERM GOAL #3   Title Patient will demonstrate SLS on the LLE for at least 5 seconds to improve stability when walking on uneven terrain.    Baseline unable    Time 8    Period Weeks    Status New      PT LONG TERM GOAL #4   Title Patient will demonstrate normalized gait pattern with LRAD once cleared by surgeon to discontinue CAM boot.    Baseline antalgic gait with CAM boot    Time 8    Period Weeks    Status New    Target Date 05/30/21      PT LONG TERM GOAL #5   Title Patient will demonstrate 5/5 Lt ankle strength to improve stability necessary for community ambulation    Baseline not assessed    Time 8    Period Weeks    Status New    Target Date 05/30/21      Additional Long Term Goals   Additional Long Term Goals Yes      PT LONG TERM GOAL #6   Title Patient will report <2/10 pain with prolonged walking/standing activity.    Baseline 8-9/10    Time 8    Period Weeks    Status New    Target  Date 05/30/21                   Plan - 05/10/21 1251     Clinical Impression Statement Patient reports improvements in her lateral ankle pain since last visit, though continues to report mild pain about the distal fibula. Able to assess inversion/eversion AROM of the Lt ankle with patient reporting pain at available end range of eversion, though no pain with inversion. Able to introduce resisted inversion strengthening today without pain and progress dorsiflexion/plantarflexion strengthening without pain. Updated HEP to include further hip  strengthening and ankle ROM as patient will be on vacation in [place] for the next two weeks.    PT Treatment/Interventions ADLs/Self Care Home Management;Cryotherapy;Software engineer;Therapeutic activities;Therapeutic exercise;Balance training;Neuromuscular re-education;Patient/family education;Manual techniques;Passive range of motion;Dry needling;Taping;Vasopneumatic Device;Stair training    PT Next Visit Plan continue L hip/knee strengthening ankle strengthening and ROM as tolerated, slowly progress peroneals and posterior tib strengthening *per patient's recent f/u with Dr Jacqualyn Posey can begin WBAT without CAM boot during PT sessions*    PT Home Exercise Plan Access Code [email or web address], added desensitization and isometric ankle eversion.    Consulted and Agree with Plan of Care Patient             Patient will benefit from skilled therapeutic intervention in order to improve the following deficits and impairments:  Abnormal gait, Decreased range of motion, Difficulty walking, Decreased endurance, Decreased activity tolerance, Pain, Decreased balance, Impaired flexibility, Decreased strength  Visit Diagnosis: Status post left foot surgery  Pain in left foot  Difficulty in walking, not elsewhere classified  Stiffness of left ankle, not elsewhere classified  Muscle weakness (generalized)     Problem List Patient Active Problem List   Diagnosis Date Noted   Allergic rhinitis 03/15/2021   Allergic rhinitis due to animal (cat) (dog) hair and dander 03/15/2021   Allergic rhinitis due to pollen 03/15/2021   Gastro-esophageal reflux disease without esophagitis 03/15/2021   Mild persistent asthma, uncomplicated 31/51/7616   Aortic atherosclerosis ([place]) by Chest CT scan on 02/02/2018  01/10/2021   Asthma    Low back pain 03/17/2019   Lumbosacral spondylosis without myelopathy 03/17/2019   Cubital tunnel syndrome 10/14/2018   Closed fracture of distal end of  right radius 07/09/2018   Pseudogout 12/09/2017   Major depressive disorder, recurrent, severe without psychotic features ([place])    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   Suicide attempt by drug ingestion ([place])    Medication management 08/22/2014   Vitamin D deficiency 08/22/2014   Hyperlipidemia, mixed 08/22/2014   Hypertension    Anxiety    Abnormal glucose    Class 2 severe obesity due to excess calories with serious comorbidity and body mass index (BMI) of 37.0 to 37.9 in adult [facility])    Gwendolyn Grant, PT, DPT, ATC 05/10/21 1:48 PM   [facility] Outpatient Rehabilitation [facility] [address] [place], [place], [address] Phone: [phone]   Fax:  [phone]  Name: BEOLA VASALLO MRN: [id number] Date of Birth: 04-May-1956

## 2021-05-22 ENCOUNTER — Other Ambulatory Visit: Payer: Self-pay | Admitting: Adult Health

## 2021-05-22 DIAGNOSIS — I10 Essential (primary) hypertension: Secondary | ICD-10-CM

## 2021-05-26 ENCOUNTER — Encounter: Payer: Self-pay | Admitting: Podiatry

## 2021-05-29 ENCOUNTER — Other Ambulatory Visit: Payer: Self-pay

## 2021-05-29 ENCOUNTER — Encounter: Payer: Self-pay | Admitting: Podiatry

## 2021-05-29 ENCOUNTER — Ambulatory Visit (INDEPENDENT_AMBULATORY_CARE_PROVIDER_SITE_OTHER): Payer: 59 | Admitting: Podiatry

## 2021-05-29 ENCOUNTER — Ambulatory Visit (INDEPENDENT_AMBULATORY_CARE_PROVIDER_SITE_OTHER): Payer: 59

## 2021-05-29 DIAGNOSIS — S96919D Strain of unspecified muscle and tendon at ankle and foot level, unspecified foot, subsequent encounter: Secondary | ICD-10-CM | POA: Diagnosis not present

## 2021-05-29 DIAGNOSIS — M76821 Posterior tibial tendinitis, right leg: Secondary | ICD-10-CM | POA: Diagnosis not present

## 2021-05-29 DIAGNOSIS — R609 Edema, unspecified: Secondary | ICD-10-CM

## 2021-05-29 DIAGNOSIS — M76822 Posterior tibial tendinitis, left leg: Secondary | ICD-10-CM | POA: Diagnosis not present

## 2021-05-29 DIAGNOSIS — M76829 Posterior tibial tendinitis, unspecified leg: Secondary | ICD-10-CM | POA: Diagnosis not present

## 2021-05-29 NOTE — Patient Instructions (Signed)
[facility] Haley [employer] is a type of bandage (dressing) for the foot and leg. The dressing is a gauze wrap that is soaked with a type of medicine called zinc oxide. The gauze may also include other lotions and medicines that help in wound healing, such as calamine. Haley Unna boot may be used to treat: Open sores (ulcers) on the foot, heel, or leg. Swelling from disorders that affect the veins or lymphatic system (lymphedema). Skin conditions such as chronic inflammation caused by poor blood flow (stasis dermatitis). The dressing is applied by a health care provider. The gauze is wrapped around your lower extremity in several layers, usually starting at the toes and going upward to the knee. A dry outer wrap goes over the medicated wrap for supportand compression.  Before applying the [employer], your health care provider will clean your leg and foot and may apply Haley antibiotic ointment. You may be asked to raise (elevate) your leg for a while to reduce swelling before the boot is applied. The boot will dry and harden after it is applied. The boot may need to be changed orreplaced about twice a week. Follow these instructions at home: [place] as told by your health care provider. You may need to wear a slipper or shoe over the boot that is one or two sizes larger than normal. Check the skin around the boot every day. Tell your health care provider about any concerns. Do not stick anything inside the boot to scratch your skin. Doing that increases your risk of infection. Keep your [employer] clean and dry. Check every day for signs of infection. Check for: Redness, swelling, or pain in your foot or toes. Fluid or blood coming from the boot. Pus or a bad smell coming from the boot. Remove the boot and call your health care provider if you have signs of poor blood flow, such as: Your toes tingle or become numb. Your toes turn cold or turn blue or pale. Your toes are more swollen  or painful. You are unable to move your toes. Activity You may walk with the boot once it has dried. Ask your health care provider how much walking is safe for you. Avoid sitting for a long time without moving. Get up to take short walks as told by your health care provider. This is important to improve blood flow. Bathing Do not take baths, swim, or use a hot tub until your health care provider approves. Ask your health care provider if you may take showers. If your health care provider approves a bath or a shower, do not let the Unna boot get wet. If you take a shower, cover the boot with a watertight covering. If you take a bath, keep your leg with the boot out of the tub. General instructions Keep your leg elevated above the level of your heart while you are sitting or lying down. This will decrease swelling. Do not sit with your knee bent for long periods of time. Take over-the-counter and prescription medicines only as told by your health care provider. Do not use any products that contain nicotine or tobacco, such as cigarettes, e-cigarettes, and chewing tobacco. These can delay healing. If you need help quitting, ask your health care provider. Keep all follow-up visits as told by your health care provider. This is important. Contact a health care provider if: Your skin feels itchy inside the boot. You have a burning sensation, a rash, or  itchy, red, swollen areas of skin (hives) in the boot area. You have a fever or chills. You have any signs of infection, such as: New redness, swelling, or pain. More fluid or blood coming from the boot. Pus or a bad smell coming from the boot. You have increased numbness or pain in your foot or toes. You have any changes in skin color on your foot or toes, such as the skin turning blue or pale or developing patchy areas with spots. Your boot has been damaged or feels like it is no longer fitting properly. Summary Haley White boot is a type of bandage  (dressing) system for the foot and leg. The dressing is a gauze wrap that is soaked with a type of medicine (zinc oxide) to treat foot, heel, or leg ulcers, swelling from disorders that affect the veins or lymphatic system (lymphedema), and skin conditions caused by poor blood flow (stasis dermatitis). This dressing is applied by a health care provider. After it is applied, the boot will dry and harden. The boot may need to be changed or replaced about twice a week. Let your health care provider know if you have any signs of poor blood flow or infection. This information is not intended to replace advice given to you by your health care provider. Make sure you discuss any questions you have with your healthcare provider. Document Revised: 02/09/2019 Document Reviewed: 07/01/2018 Elsevier Patient Education  [place].

## 2021-05-30 ENCOUNTER — Encounter: Payer: Self-pay | Admitting: Podiatry

## 2021-05-30 ENCOUNTER — Ambulatory Visit: Payer: 59

## 2021-05-30 MED ORDER — CELECOXIB 100 MG PO CAPS: 100.0000 mg | ORAL_CAPSULE | Freq: Two times a day (BID) | ORAL | 0 refills | Status: DC

## 2021-06-03 NOTE — Progress Notes (Signed)
Subjective: Haley White is a 65 y.o. is seen today in office s/p left foot exostectomy, repair posterior tibial tendon, peroneal tendon with gastrocnemius recession preformed on 01/31/2021.  Since last saw her she had gone to [place].  She said that she was doing pretty well.  The first day she did walk a mile and had pain after she recovered from that she did well into the last day when she was getting off a boat and her foot got caught and she twisted her ankle and she has increased pain.  She presents today for further evaluation.  She has been walking the cam boot.   Objective: General: No acute distress, AAOx3  DP/PT pulses palpable 2/4, CRT < 3 sec to all digits.  Protective sensation intact. Motor function intact.  LEFT foot: Incision is well coapted without any evidence of dehiscence.  The majority of tenderness does appear to be increased compared to the last saw her is on the course the peroneal tendon.  There is no area of pinpoint tenderness.  There is minimal edema there is no erythema or warmth.  No bruising present.  No proximal tib-fib pain.  No pain to the foot otherwise. No pain with calf compression, swelling, warmth, erythema.   Assessment and Plan:  Status post left foot surgery; injury left ankle  -Treatment options discussed including all alternatives, risks, and complications -X-rays obtained and reviewed.  No evidence of acute fracture identified.  Arthritic changes present of the midfoot. -Unna boot was applied.  Precautions were advised on when to remove this.  Continue cam boot.  I want a wait another week or so before she starts to go back to physical therapy.  Also ordered MRI of the ankle to rule out further tearing of the peroneal tendon versus subluxation.  Clinically appears to be in the group given her tenderness and her recent injury will obtain the MRI.  Trula Slade DPM

## 2021-06-05 ENCOUNTER — Telehealth: Payer: Self-pay | Admitting: *Deleted

## 2021-06-05 NOTE — Telephone Encounter (Signed)
Called and spoke with the patient and stated that [facility] [employer] was working on the authorization and would be calling the patient once they have authorization approved. Lattie Haw

## 2021-06-06 ENCOUNTER — Other Ambulatory Visit: Payer: Self-pay

## 2021-06-06 ENCOUNTER — Ambulatory Visit
Admission: RE | Admit: 2021-06-06 | Discharge: 2021-06-06 | Disposition: A | Discharge status: Home / Self Care | Payer: 59 | Source: Ambulatory Visit | Attending: Podiatry | Admitting: Podiatry

## 2021-06-06 DIAGNOSIS — S96919D Strain of unspecified muscle and tendon at ankle and foot level, unspecified foot, subsequent encounter: Secondary | ICD-10-CM

## 2021-06-07 ENCOUNTER — Telehealth: Payer: Self-pay | Admitting: *Deleted

## 2021-06-07 NOTE — Telephone Encounter (Signed)
Patient is returning a call back, thought it was concerning the results of her recent MRI. Please advise.

## 2021-06-08 ENCOUNTER — Other Ambulatory Visit: Payer: Self-pay | Admitting: Podiatry

## 2021-06-08 MED ORDER — IBUPROFEN 800 MG PO TABS: 800.0000 mg | ORAL_TABLET | Freq: Three times a day (TID) | ORAL | 0 refills | Status: DC | PRN

## 2021-06-12 ENCOUNTER — Other Ambulatory Visit: Payer: Self-pay

## 2021-06-12 ENCOUNTER — Ambulatory Visit (INDEPENDENT_AMBULATORY_CARE_PROVIDER_SITE_OTHER): Payer: 59 | Admitting: Podiatry

## 2021-06-12 ENCOUNTER — Ambulatory Visit: Payer: 59 | Attending: Podiatry

## 2021-06-12 ENCOUNTER — Encounter: Payer: Self-pay | Admitting: Podiatry

## 2021-06-12 DIAGNOSIS — Z9889 Other specified postprocedural states: Secondary | ICD-10-CM | POA: Diagnosis present

## 2021-06-12 DIAGNOSIS — S96919D Strain of unspecified muscle and tendon at ankle and foot level, unspecified foot, subsequent encounter: Secondary | ICD-10-CM | POA: Diagnosis not present

## 2021-06-12 DIAGNOSIS — R262 Difficulty in walking, not elsewhere classified: Secondary | ICD-10-CM | POA: Diagnosis present

## 2021-06-12 DIAGNOSIS — R609 Edema, unspecified: Secondary | ICD-10-CM

## 2021-06-12 DIAGNOSIS — M79672 Pain in left foot: Secondary | ICD-10-CM | POA: Diagnosis present

## 2021-06-12 DIAGNOSIS — M6281 Muscle weakness (generalized): Secondary | ICD-10-CM | POA: Diagnosis present

## 2021-06-12 DIAGNOSIS — M25672 Stiffness of left ankle, not elsewhere classified: Secondary | ICD-10-CM | POA: Insufficient documentation

## 2021-06-12 NOTE — Therapy (Signed)
[place], [place], [address] Phone: [phone]   Fax:  [phone]  Physical Therapy Treatment/Re-evaluation  Patient Details  Name: Haley White MRN: [id number] Date of Birth: [birth year] Referring Provider (PT): Trula Slade, [place]   Encounter Date: 06/12/2021   PT End of Session - 06/12/21 1236     Visit Number 11    Number of Visits 19    Date for PT Re-Evaluation 07/14/21    Authorization Type [employer]: 30 VL    Authorization - Visit Number 19    Authorization - Number of Visits 30    PT Start Time 5038    PT Stop Time 1315    PT Time Calculation (min) 40 min    Equipment Utilized During Treatment Other (comment)   Cam boot   Activity Tolerance Patient tolerated treatment well    Behavior During Therapy WFL for tasks assessed/performed             Past Medical History:  Diagnosis Date   Anxiety    Arthritis    Asthma    Cataract    Gout    Hyperlipidemia    Hypertension    Obese     Past Surgical History:  Procedure Laterality Date   APPENDECTOMY  1984   CATARACT EXTRACTION W/ INTRAOCULAR LENS  IMPLANT, BILATERAL  2014   [place]   X3   COLONOSCOPY  11/16/2013   w/Brodie    FOOT SURGERY Left 2002   [place] Right 2007   TUBAL LIGATION  1994    There were no vitals filed for this visit.   Subjective Assessment - 06/12/21 1237     Subjective She was getting up on float plane in [place] and fell going up the step and her Lt ankle twisted and got caught between the [place] and cable on 05/26/21. She saw Dr. Jacqualyn Posey upon her return from vacation and had an MRI ordered due to increased pain and swelling from this fall. She had with Dr. Jacqualyn Posey again today with MRI showing peroneal tear, though is likely from her recent surgical repair as well as a peroneal subluxation. He was thinking about placing [place] boot  today, but instead encouraged patient to resume PT for a few weeks to determine if this helps with her pain/function, but there is a chance surgery will be needed. She has f/u with Dr. Jacqualyn Posey on 8/25. She reports the pain is about the same as initial onset.    How long can you sit comfortably? indefinitely, as long as foot is elevated    How long can you stand comfortably? 3 minutes    How long can you walk comfortably? "I have gone to the grocery store and I paid for it"    Diagnostic tests IMPRESSION:  1. 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  Long segment longitudinal split tear of the peroneus brevis tendon.  Peroneus longus tendon is laterally subluxed.  2. 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  3. 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    Currently in Pain? Yes    Pain Score 7     Pain Location Ankle    Pain Orientation Left;Lateral    Pain Descriptors / Indicators Aching   pinching   Pain Type Acute pain  Pain Onset 1 to 4 weeks ago    Pain Frequency Constant    Aggravating Factors  pronlonged standing/walking    Pain Relieving Factors ice, rest                [facility] PT Assessment - 06/12/21 0001       Assessment   Medical Diagnosis PTTD (posterior tibial tendon dysfunction)  S96.919A (ICD-10-CM) - Tendon tear, ankle, initial encounter  Z98.890 (ICD-10-CM) - Status post left foot surgery    Referring Provider (PT) Trula Slade, DPM      Precautions   Precaution Comments Per Dr. Jacqualyn Posey begin with manual therapy with slow progression into working the Lt foot      Restrictions   LLE Weight Bearing Weight bearing as tolerated      Observation/Other Assessments   Focus on Therapeutic Outcomes (FOTO)  33% function      Observation/Other Assessments-Edema    Edema Figure 8      Figure 8 Edema   Figure 8 - Right   50 cm    Figure 8 - Left  52 cm      AROM   Overall AROM Comments pain with all ankle AROM    Left Ankle Dorsiflexion --   lacking 5   Left Ankle Inversion 5    Left Ankle Eversion 4      Strength   Overall Strength Comments ankle MMT deferred due to pain with AROM      Palpation   Palpation comment diffuse tenderness about lateral ankle, most notable along peroneal brevis tendon      Ambulation/Gait   Gait Comments CAM boot donned                           [facility] Adult PT Treatment/Exercise - 06/12/21 0001       Self-Care   Other Self-Care Comments  see patient education      Manual Therapy   Edema Management K-tape fan shape to lateral malleoli/sinus tarsi    Other Manual Therapy STM to proximal peroneals Lt                    PT Education - 06/12/21 1327     Education Details Educateion on re-assessment findings and POC moving forward. Frequent ice and elevation to assist in pain reduction/swelling. Updated HEP to include gentle calf stretching, toe flexion/extension AROM, and desensitization to lateral ankle.    Person(s) Educated Patient    Methods Explanation;Demonstration;Handout    Comprehension Verbalized understanding;Returned demonstration              PT Short Term Goals - 06/12/21 1339       PT SHORT TERM GOAL #1   Title Patient will be independent and compliant with established HEP.    Status Achieved      PT SHORT TERM GOAL #2   Title Patient will ambulate without axillary crutches.    Status Achieved      PT SHORT TERM GOAL #3   Title Patient will demonstrate at least 10 degrees of Lt ankle dorsiflexion AROM to improve gait mechanics.    Baseline 13 deg 05/01/21; lacking 5 06/12/21    Status --   previously achieved     PT SHORT TERM GOAL #4   Title Patient's Lt ankle figure 8 will be </= 50 cm to signify reduction in Lt ankle swelling.    Baseline 52 cm  Time 2    Period Weeks    Status New    Target Date  06/26/21               PT Long Term Goals - 06/12/21 1253       PT LONG TERM GOAL #1   Title Patient will score at least 45% function on FOTO to signify clinically meaningful improvement in functional abilities.    Baseline 33 06/12/21; had previously met    Time 8    Period Weeks    Status --   previously achieved     PT LONG TERM GOAL #2   Title Patient will demonstrate 5/5 Lt hip strength to improve stability about the chain for prolonged walking.    Baseline see flowsheet    Time 8    Period Weeks    Status Deferred      PT LONG TERM GOAL #3   Title Patient will demonstrate SLS on the LLE for at least 5 seconds to improve stability when walking on uneven terrain.    Baseline unable    Time 8    Period Weeks    Status Unable to assess      PT LONG TERM GOAL #4   Title Patient will demonstrate normalized gait pattern with LRAD once cleared by surgeon to discontinue CAM boot.    Baseline continues to wear CAM boot    Time 8    Period Weeks    Status On-going      PT LONG TERM GOAL #5   Title Patient will demonstrate 5/5 Lt ankle strength to improve stability necessary for community ambulation    Baseline not assessed due to pain with AROM    Time 8    Period Weeks    Status Unable to assess      PT LONG TERM GOAL #6   Title Patient will report <2/10 pain with prolonged walking/standing activity.    Baseline 10/10    Time 8    Period Weeks    Status On-going                   Plan - 06/12/21 1254     Clinical Impression Statement Patient was making good functional progress prior to her trip to [place] in mid July where she fell and twisted her Lt ankle when stepping onto a float plane. She has been evaluate dby Dr. Jacqualyn Posey who enouraged her to resume PT focusing on manual techniques at first geared towards edema reduction, improving her ROM, and decreasing her pain. She did receive another MRI that showed peroneal tearing, though Dr. Jacqualyn Posey believes  this could still be post-operative healing at this time. She reports overall the pain has remained about the same since initial onset and she is unable to tolerate weight bearing actiivty for even short durations at this time due to pain. Upon re-assessment she is noted to have a decrease in Lt ankle AROM compared to previous measures, siginficant tenderness about the lateral ankle, and mild swelling about the lateral ankle that are consistent with her acute twisting mechanism of inury to the left ankle that occured 2.5 weeks ago. She will benefit from continuing with PT to address her Lt ankle pain, swelling, ROM/strength deficits and safely progress towards weight bearing activities as tolerated in order to return to optimal function.    PT Frequency --   1-2/week   PT Duration 4 weeks    PT Treatment/Interventions ADLs/Self Care Home  Management;Cryotherapy;Software engineer;Therapeutic activities;Therapeutic exercise;Balance training;Neuromuscular re-education;Patient/family education;Manual techniques;Passive range of motion;Dry needling;Taping;Vasopneumatic Device;Stair training;Aquatic Therapy    PT Next Visit Plan manual to Lt ankle to assist in edema reduction and ROM. ROM activity to tolerance, consider isometrics, update/review HEP.    PT Home Exercise Plan Access Code [email or web address]    Consulted and Agree with Plan of Care Patient             Patient will benefit from skilled therapeutic intervention in order to improve the following deficits and impairments:  Abnormal gait, Decreased range of motion, Difficulty walking, Decreased endurance, Decreased activity tolerance, Pain, Decreased balance, Impaired flexibility, Decreased strength, Increased edema  Visit Diagnosis: Status post left foot surgery  Pain in left foot  Difficulty in walking, not elsewhere classified  Stiffness of left ankle, not elsewhere classified  Muscle weakness  (generalized)     Problem List Patient Active Problem List   Diagnosis Date Noted   Allergic rhinitis 03/15/2021   Allergic rhinitis due to animal (cat) (dog) hair and dander 03/15/2021   Allergic rhinitis due to pollen 03/15/2021   Gastro-esophageal reflux disease without esophagitis 03/15/2021   Mild persistent asthma, uncomplicated 60/15/6153   Aortic atherosclerosis ([place]) by Chest CT scan on 02/02/2018  01/10/2021   Asthma    Low back pain 03/17/2019   Lumbosacral spondylosis without myelopathy 03/17/2019   Cubital tunnel syndrome 10/14/2018   Closed fracture of distal end of right radius 07/09/2018   Pseudogout 12/09/2017   Major depressive disorder, recurrent, severe without psychotic features ([place])    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 [place], [place], [address] Phone: [phone]   Fax:  [phone]  Name: Haley White MRN: [id number] Date of Birth: [birth year]

## 2021-06-14 ENCOUNTER — Other Ambulatory Visit: Payer: Self-pay

## 2021-06-14 ENCOUNTER — Ambulatory Visit: Payer: 59

## 2021-06-14 ENCOUNTER — Encounter: Payer: Self-pay | Admitting: Podiatry

## 2021-06-14 DIAGNOSIS — M6281 Muscle weakness (generalized): Secondary | ICD-10-CM

## 2021-06-14 DIAGNOSIS — R262 Difficulty in walking, not elsewhere classified: Secondary | ICD-10-CM

## 2021-06-14 DIAGNOSIS — M79672 Pain in left foot: Secondary | ICD-10-CM

## 2021-06-14 DIAGNOSIS — M25672 Stiffness of left ankle, not elsewhere classified: Secondary | ICD-10-CM

## 2021-06-14 DIAGNOSIS — Z9889 Other specified postprocedural states: Secondary | ICD-10-CM

## 2021-06-14 NOTE — Therapy (Signed)
[place], [place], [address] Phone: [phone]   Fax:  [phone]  Physical Therapy Treatment  Patient Details  Name: Haley White MRN: [id number] Date of Birth: 1956/03/13 Referring Provider (PT): Trula Slade, [place]   Encounter Date: 06/14/2021   PT End of Session - 06/14/21 1618     Visit Number 12    Number of Visits 19    Date for PT Re-Evaluation 07/14/21    Authorization Type [employer]: 30 VL    Authorization - Visit Number 20    Authorization - Number of Visits 30    PT Start Time 1618    PT Stop Time [place] at end of session   PT Time Calculation (min) 52 min    Equipment Utilized During Treatment Other (comment)   Cam boot   Activity Tolerance Patient tolerated treatment well    Behavior During Therapy [facility] for tasks assessed/performed             Past Medical History:  Diagnosis Date   Anxiety    Arthritis    Asthma    Cataract    Gout    Hyperlipidemia    Hypertension    Obese     Past Surgical History:  Procedure Laterality Date   APPENDECTOMY  1984   CATARACT EXTRACTION W/ INTRAOCULAR LENS  IMPLANT, BILATERAL  2014   [place]   X3   COLONOSCOPY  11/16/2013   w/Brodie    FOOT SURGERY Left 2002   KNEE SURGERY Left 1998   ROTATOR CUFF REPAIR Right 2007   TUBAL LIGATION  1994    There were no vitals filed for this visit.   Subjective Assessment - 06/14/21 1619     Subjective She reports the foot is not doing well at all. She had a dream last night that she was climbing and her foot was hurting and woke up in intense pain.    How long can you sit comfortably? indefinitely, as long as foot is elevated    How long can you stand comfortably? 3 minutes    How long can you walk comfortably? "I have gone to the grocery store and I paid for it"    Diagnostic tests IMPRESSION:  1. Progressive tendinosis of the peroneus longus and  brevis tendons.  Long segment longitudinal split tear of the peroneus brevis tendon.  Peroneus longus tendon is laterally subluxed.  2. 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  3. 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    Currently in Pain? Yes    Pain Score 8     Pain Location Leg    Pain Orientation Lower;Left    Pain Descriptors / Indicators Aching;Sharp    Pain Type Acute pain    Pain Radiating Towards to left ankle    Pain Onset 1 to 4 weeks ago    Pain Frequency Intermittent    Aggravating Factors  prolonged standing/walking    Pain Relieving Factors ice, rest                [facility] PT Assessment - 06/14/21 0001       Figure 8 Edema   Figure 8 - Left  50cm  [place] Adult PT Treatment/Exercise - 06/14/21 0001       Self-Care   Self-Care Other Self-Care Comments    Other Self-Care Comments  see patient education      Vasopneumatic   Number Minutes Vasopneumatic  10 minutes    Vasopnuematic Location  Ankle    Vasopneumatic Pressure Low    Vasopneumatic Temperature  34      Manual Therapy   Edema Management K-tape fan shape to lateral malleoli/sinus tarsi    Other Manual Therapy STM proximal peroneals, gastroc/soleus, passive calf stretch      Ankle Exercises: Supine   Isometrics dorsiflexion 1 x 10 (attempted plantarflexion and inversion, unable due to pain)                    PT Education - 06/14/21 1755     Education Details Education on ice frequency, duration; educated and issued tennis ball for self-soft tissue mobilization; updated HEP.    Person(s) Educated Patient    Methods Explanation;Demonstration;Handout;Verbal cues;Tactile cues    Comprehension Verbalized understanding;Returned demonstration;Verbal cues required;Tactile cues required              PT Short Term Goals -  06/14/21 1752       PT SHORT TERM GOAL #1   Title Patient will be independent and compliant with established HEP.    Status Achieved      PT SHORT TERM GOAL #2   Title Patient will ambulate without axillary crutches.    Status Achieved      PT SHORT TERM GOAL #3   Title Patient will demonstrate at least 10 degrees of Lt ankle dorsiflexion AROM to improve gait mechanics.    Baseline 13 deg 05/01/21; lacking 5 06/12/21    Status On-going   previously achieved     PT SHORT TERM GOAL #4   Title Patient's Lt ankle figure 8 will be </= 50 cm to signify reduction in Lt ankle swelling.    Baseline 52 cm; 50 cm 8/11    Time 2    Period Weeks    Status Achieved    Target Date 06/26/21               PT Long Term Goals - 06/12/21 1253       PT LONG TERM GOAL #1   Title Patient will score at least 45% function on FOTO to signify clinically meaningful improvement in functional abilities.    Baseline 33 06/12/21; had previously met    Time 8    Period Weeks    Status --   previously achieved     PT LONG TERM GOAL #2   Title Patient will demonstrate 5/5 Lt hip strength to improve stability about the chain for prolonged walking.    Baseline see flowsheet    Time 8    Period Weeks    Status Deferred      PT LONG TERM GOAL #3   Title Patient will demonstrate SLS on the LLE for at least 5 seconds to improve stability when walking on uneven terrain.    Baseline unable    Time 8    Period Weeks    Status Unable to assess      PT LONG TERM GOAL #4   Title Patient will demonstrate normalized gait pattern with LRAD once cleared by surgeon to discontinue CAM boot.    Baseline continues to wear CAM boot    Time 8    Period Weeks  Status On-going      PT LONG TERM GOAL #5   Title Patient will demonstrate 5/5 Lt ankle strength to improve stability necessary for community ambulation    Baseline not assessed due to pain with AROM    Time 8    Period Weeks    Status Unable to assess       PT LONG TERM GOAL #6   Title Patient will report <2/10 pain with prolonged walking/standing activity.    Baseline 10/10    Time 8    Period Weeks    Status On-going                   Plan - 06/14/21 1700     Clinical Impression Statement Patient arrives with continued high pain levels about the left ankle/lower leg. Swelling has reduced compared to last session with figure 8 measurement at 50 cm today. K-tape re-applied as some swelling still remains about the lateral malleoli. Significant tautness and palpable tenderness about lateral gastroc/soleus with partial release from manual therapy. Able to complete isometric ankle dorsiflexion without reports of increased pain.    PT Frequency --   1-2/week   PT Duration 4 weeks    PT Treatment/Interventions ADLs/Self Care Home Management;Cryotherapy;Software engineer;Therapeutic activities;Therapeutic exercise;Balance training;Neuromuscular re-education;Patient/family education;Manual techniques;Passive range of motion;Dry needling;Taping;Vasopneumatic Device;Stair training;Aquatic Therapy    PT Next Visit Plan manual to Lt ankle to assist in edema reduction and ROM. ROM activity to tolerance, consider isometrics, update/review HEP.    PT Home Exercise Plan Access Code [place] and Agree with Plan of Care Patient             Patient will benefit from skilled therapeutic intervention in order to improve the following deficits and impairments:  Abnormal gait, Decreased range of motion, Difficulty walking, Decreased endurance, Decreased activity tolerance, Pain, Decreased balance, Impaired flexibility, Decreased strength, Increased edema  Visit Diagnosis: Status post left foot surgery  Pain in left foot  Difficulty in walking, not elsewhere classified  Stiffness of left ankle, not elsewhere classified  Muscle weakness (generalized)     Problem List Patient Active Problem List    Diagnosis Date Noted   Allergic rhinitis 03/15/2021   Allergic rhinitis due to animal (cat) (dog) hair and dander 03/15/2021   Allergic rhinitis due to pollen 03/15/2021   Gastro-esophageal reflux disease without esophagitis 03/15/2021   Mild persistent asthma, uncomplicated 13/14/3888   Aortic atherosclerosis ([place]) by Chest CT scan on 02/02/2018  01/10/2021   Asthma    Low back pain 03/17/2019   Lumbosacral spondylosis without myelopathy 03/17/2019   Cubital tunnel syndrome 10/14/2018   Closed fracture of distal end of right radius 07/09/2018   Pseudogout 12/09/2017   Major depressive disorder, recurrent, severe without psychotic features ([place])    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 [place], [place], [address] Phone: [phone]   Fax:  [phone]  Name: Haley White MRN: [id number] Date of Birth: Aug 02, 1956

## 2021-06-15 ENCOUNTER — Other Ambulatory Visit: Payer: Self-pay | Admitting: Podiatry

## 2021-06-15 MED ORDER — CELECOXIB 100 MG PO CAPS: 100.0000 mg | ORAL_CAPSULE | Freq: Two times a day (BID) | ORAL | 0 refills | Status: DC

## 2021-06-15 NOTE — Progress Notes (Signed)
Subjective: Haley White is a 65 y.o. is seen today in office s/p left foot exostectomy, repair posterior tibial tendon, peroneal tendon with gastrocnemius recession preformed on 01/31/2021.  While she was in [place] she was wearing the boot but she was getting up" she twisted her ankle and since then she has had ongoing discomfort.  MRI was performed which I called her with the results and she presents today for follow-up evaluation.  We transferred her to ibuprofen and see if this will do better but she states that Celebrex helps more.  She states that she wears compression and also feels better.  Objective: General: No acute distress, AAOx3  DP/PT pulses palpable 2/4, CRT < 3 sec to all digits.  Protective sensation intact. Motor function intact.  LEFT foot: Incision is well coapted without any evidence of dehiscence.  Majority tenderness is still in the course the peroneal tendon.  She describes a pulling sensation in the lateral aspect the calf as well.  This appears to be new but the tenderness is the same.  Minimal edema.  There is no erythema or warmth. No pain with calf compression, swelling, warmth, erythema.   Assessment and Plan:  Status post left foot surgery; injury left ankle  -Treatment options discussed including all alternatives, risks, and complications -I again reviewed the MRI with her.  I discussed with her further surgical intervention hopefully we can avoid this.  She does admit that she has been walking at home without the boot she is going barefoot.  I recommended her not to do this and she is to wear at least a shoe with the ankle brace.  I did not her going barefoot without any immobilization also has been contributing to the pain.  Working to restart physical therapy as she has already been walking without the boot.  I discussed with physical therapist as well guarding to start gradually with manual therapy before aggressive physical therapy.  Continue ice elevate.  Also  reported in the boot today but we held off on this as she is starting physical therapy later today.  Anklet was dispensed.  Trula Slade DPM

## 2021-06-18 ENCOUNTER — Encounter: Payer: Self-pay | Admitting: Rehabilitative and Restorative Service Providers"

## 2021-06-19 ENCOUNTER — Ambulatory Visit: Payer: 59

## 2021-06-19 ENCOUNTER — Other Ambulatory Visit: Payer: Self-pay

## 2021-06-19 ENCOUNTER — Encounter: Payer: Self-pay | Admitting: Podiatry

## 2021-06-19 DIAGNOSIS — Z9889 Other specified postprocedural states: Secondary | ICD-10-CM

## 2021-06-19 DIAGNOSIS — M6281 Muscle weakness (generalized): Secondary | ICD-10-CM

## 2021-06-19 DIAGNOSIS — R262 Difficulty in walking, not elsewhere classified: Secondary | ICD-10-CM

## 2021-06-19 DIAGNOSIS — M25672 Stiffness of left ankle, not elsewhere classified: Secondary | ICD-10-CM

## 2021-06-19 DIAGNOSIS — M79672 Pain in left foot: Secondary | ICD-10-CM

## 2021-06-19 NOTE — Therapy (Addendum)
[place], [place], [address] Phone: [phone]   Fax:  [phone]  Physical Therapy Treatment/Discharge  Patient Details  Name: Haley White MRN: [id number] Date of Birth: [birth year] Referring Provider (PT): Trula Slade, [place]   Encounter Date: 06/19/2021   PT End of Session - 06/19/21 0931     Visit Number 13    Number of Visits 19    Date for PT Re-Evaluation 07/14/21    Authorization Type [employer]: 30 VL    Authorization - Visit Number 21    Authorization - Number of Visits 30    PT Start Time 8811    PT Stop Time 0315    PT Time Calculation (min) 43 min    Equipment Utilized During Treatment Other (comment)   Cam boot   Activity Tolerance Patient limited by pain    Behavior During Therapy [facility] for tasks assessed/performed             Past Medical History:  Diagnosis Date   Anxiety    Arthritis    Asthma    Cataract    Gout    Hyperlipidemia    Hypertension    Obese     Past Surgical History:  Procedure Laterality Date   APPENDECTOMY  1984   CATARACT EXTRACTION W/ INTRAOCULAR LENS  IMPLANT, BILATERAL  2014   CESAREAN SECTION  1984, 1987, 1994   X3   COLONOSCOPY  11/16/2013   w/Brodie    FOOT SURGERY Left 2002   KNEE SURGERY Left 1998   ROTATOR CUFF REPAIR Right 2007   TUBAL LIGATION  1994    There were no vitals filed for this visit.   Subjective Assessment - 06/19/21 0932     Subjective She felt/heard a large pop in the foot on Sunday when transitioning from sitting on the couch to sitting up. The pop was very painful along the lateral aspect of the ankle and ever since this incident she has needed to revert back to using her crutches due to increased pain. Up until this pop she was able to complete her HEP. She reports the foot is no better since her recent injury in July with popping incident actually making her pain worse.    How long can you sit comfortably?  indefinitely, as long as foot is elevated    How long can you stand comfortably? 3 minutes    How long can you walk comfortably? "I have gone to the grocery store and I paid for it"    Diagnostic tests IMPRESSION:  1. 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  Long segment longitudinal split tear of the peroneus brevis tendon.  Peroneus longus tendon is laterally subluxed.  2. 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  3. 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    Currently in Pain? Yes    Pain Score 6     Pain Location Ankle    Pain Orientation Left;Lateral    Pain Descriptors / Indicators Sharp;Aching    Pain Type Acute pain    Pain Onset 1 to 4 weeks ago    Pain Frequency Intermittent    Aggravating Factors  prolonged standing/walking    Pain Relieving Factors ice, rest  [place] Adult PT Treatment/Exercise - 06/19/21 0001       Self-Care   Other Self-Care Comments  see patient education      Manual Therapy   Manual therapy comments metatarsal glides    Other Manual Therapy STM proximal peroneals, gatroc/soleus; unable to tolerate gentle DF/PF PROM, passive great toe extension      Ankle Exercises: Seated   Other Seated Ankle Exercises arch lifts 1 x 10    Other Seated Ankle Exercises toe flexion/extension 1 x 10; attempted isometric dorsiflexion/plantarflexion unable due to pain                    PT Education - 06/19/21 1019     Education Details Reviewed ice frequency, duration. Discussed utilizing ice cup. Reviewed use of tennis ball for self-soft tissue mobilization. Updated HEP to include foot intrinsics. Encouraged to use crutches as needed for pain. Discussed placing PT on hold until she is able to f/u with Dr. Jacqualyn Posey.    Person(s) Educated Patient    Methods  Explanation;Demonstration;Verbal cues;Handout    Comprehension Verbalized understanding;Returned demonstration;Verbal cues required              PT Short Term Goals - 06/14/21 1752       PT SHORT TERM GOAL #1   Title Patient will be independent and compliant with established HEP.    Status Achieved      PT SHORT TERM GOAL #2   Title Patient will ambulate without axillary crutches.    Status Achieved      PT SHORT TERM GOAL #3   Title Patient will demonstrate at least 10 degrees of Lt ankle dorsiflexion AROM to improve gait mechanics.    Baseline 13 deg 05/01/21; lacking 5 06/12/21    Status On-going   previously achieved     PT SHORT TERM GOAL #4   Title Patient's Lt ankle figure 8 will be </= 50 cm to signify reduction in Lt ankle swelling.    Baseline 52 cm; 50 cm 8/11    Time 2    Period Weeks    Status Achieved    Target Date 06/26/21               PT Long Term Goals - 06/12/21 1253       PT LONG TERM GOAL #1   Title Patient will score at least 45% function on FOTO to signify clinically meaningful improvement in functional abilities.    Baseline 33 06/12/21; had previously met    Time 8    Period Weeks    Status --   previously achieved     PT LONG TERM GOAL #2   Title Patient will demonstrate 5/5 Lt hip strength to improve stability about the chain for prolonged walking.    Baseline see flowsheet    Time 8    Period Weeks    Status Deferred      PT LONG TERM GOAL #3   Title Patient will demonstrate SLS on the LLE for at least 5 seconds to improve stability when walking on uneven terrain.    Baseline unable    Time 8    Period Weeks    Status Unable to assess      PT LONG TERM GOAL #4   Title Patient will demonstrate normalized gait pattern with LRAD once cleared by surgeon to discontinue CAM boot.    Baseline continues to wear CAM boot    Time 8  Period Weeks    Status On-going      PT LONG TERM GOAL #5   Title Patient will demonstrate 5/5 Lt  ankle strength to improve stability necessary for community ambulation    Baseline not assessed due to pain with AROM    Time 8    Period Weeks    Status Unable to assess      PT LONG TERM GOAL #6   Title Patient will report <2/10 pain with prolonged walking/standing activity.    Baseline 10/10    Time 8    Period Weeks    Status On-going                   Plan - 06/19/21 1003     Clinical Impression Statement Taila arrives with worsening pain ever since experiencing a painful pop about the lateral ankle on Sunday when transitioning from sitting on her couch to sitting upright. She demonstrates an antalgic gait with CAM boot donned and has reverted back to using her crutches due to increased pain. She is unable to tolerate gentle dorsiflexion and plantarflexion PROM or ankle isometrics that do not directly stress the peroneal tendons. Given her increasing pain and inability to tolerate gentle manual therapy or basic ther ex, PT has been placed on hold until she can f/u with Dr. Jacqualyn Posey for further assessment. She is in agreement with this plan.    PT Treatment/Interventions ADLs/Self Care Home Management;Cryotherapy;Software engineer;Therapeutic activities;Therapeutic exercise;Balance training;Neuromuscular re-education;Patient/family education;Manual techniques;Passive range of motion;Dry needling;Taping;Vasopneumatic Device;Stair training;Aquatic Therapy    PT Next Visit Plan PT on hold until f/u with Dr. Jacqualyn Posey    PT Home Exercise Plan Access Code [facility]    Consulted and Agree with Plan of Care Patient             Patient will benefit from skilled therapeutic intervention in order to improve the following deficits and impairments:  Abnormal gait, Decreased range of motion, Difficulty walking, Decreased endurance, Decreased activity tolerance, Pain, Decreased balance, Impaired flexibility, Decreased strength, Increased edema  Visit  Diagnosis: Pain in left foot  Status post left foot surgery  Difficulty in walking, not elsewhere classified  Stiffness of left ankle, not elsewhere classified  Muscle weakness (generalized)     Problem List Patient Active Problem List   Diagnosis Date Noted   Allergic rhinitis 03/15/2021   Allergic rhinitis due to animal (cat) (dog) hair and dander 03/15/2021   Allergic rhinitis due to pollen 03/15/2021   Gastro-esophageal reflux disease without esophagitis 03/15/2021   Mild persistent asthma, uncomplicated 37/90/2409   Aortic atherosclerosis ([place]) by Chest CT scan on 02/02/2018  01/10/2021   Asthma    Low back pain 03/17/2019   Lumbosacral spondylosis without myelopathy 03/17/2019   Cubital tunnel syndrome 10/14/2018   Closed fracture of distal end of right radius 07/09/2018   Pseudogout 12/09/2017   Major depressive disorder, recurrent, severe without psychotic features ([place])    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   Suicide attempt by drug ingestion ([place])    Medication management 08/22/2014   Vitamin D deficiency 08/22/2014   Hyperlipidemia, mixed 08/22/2014   Hypertension    Anxiety    Abnormal glucose    Class 2 severe obesity due to excess calories with serious comorbidity and body mass index (BMI) of 37.0 to 37.9 in adult [facility])    Gwendolyn Grant, PT, DPT, ATC 06/19/21 11:42 AM  PHYSICAL THERAPY DISCHARGE SUMMARY  Visits from Start of Care: 13  Current functional level  related to goals / functional outcomes: See goals above   Remaining deficits: Pain, weakness, antalgic gait,balance deficits, ROM limitations.    Education / Equipment: See above   Patient agrees to discharge. Patient goals were partially met. Patient is being discharged due to a change in medical status. (Undergoing surgery)   Gwendolyn Grant, PT, DPT, ATC 06/25/21 2:41 PM   [place] [facility] [address] [place], [place],  [address] Phone: [phone]   Fax:  [phone]  Name: Haley White MRN: [id number] Date of Birth: [birth year]

## 2021-06-20 ENCOUNTER — Ambulatory Visit: Payer: 59

## 2021-06-21 ENCOUNTER — Ambulatory Visit (INDEPENDENT_AMBULATORY_CARE_PROVIDER_SITE_OTHER): Payer: 59 | Admitting: Podiatry

## 2021-06-21 ENCOUNTER — Encounter: Payer: Self-pay | Admitting: Podiatry

## 2021-06-21 ENCOUNTER — Encounter: Payer: Self-pay | Admitting: Rehabilitative and Restorative Service Providers"

## 2021-06-21 ENCOUNTER — Other Ambulatory Visit: Payer: Self-pay

## 2021-06-21 DIAGNOSIS — S96919D Strain of unspecified muscle and tendon at ankle and foot level, unspecified foot, subsequent encounter: Secondary | ICD-10-CM | POA: Diagnosis not present

## 2021-06-21 MED ORDER — TRAMADOL HCL 50 MG PO TABS: 50.0000 mg | ORAL_TABLET | Freq: Three times a day (TID) | ORAL | 0 refills | Status: AC | PRN

## 2021-06-21 NOTE — Patient Instructions (Signed)

## 2021-06-23 NOTE — Progress Notes (Signed)
Subjective: Haley White is a 65 y.o. is seen today in office s/p left foot exostectomy, repair posterior tibial tendon, peroneal tendon with gastrocnemius recession preformed on 01/31/2021.  Since I last saw her she started physical therapy.  However she states that she had stood up and twisted her foot, nothing major and she felt a pop in her ankle.  She follow-up with physical therapy they were not able to do this and she was referred back to me for further evaluation.  She was doing well and we will start physical therapy previously.  She had an injury back when she was in [place] when she twisted her foot and that is when the symptoms worsen.  MRI was repeated which did reveal progressive worsening of the tear and slight lateral subluxation of the peroneal tendon.  At the point I was hoping that that was likely postsurgical but I think this was from the injury.  Celebrex seems to help more than the ibuprofen.  No fevers or chills.  No other concerns.  Objective: General: No acute distress, AAOx3  DP/PT pulses palpable 2/4, CRT < 3 sec to all digits.  Protective sensation intact. Motor function intact.  LEFT foot: Incision is well coapted without any evidence of dehiscence.  There is still tenderness palpation on the course of the peroneal tendon posterior and inferior to the lateral malleolus.  There is weakness with eversion of the foot on the area of the peroneal tendon she has discomfort with range of motion as well.  Range of motion is decreased given pain.  Minimal edema to the area there is no erythema.  No ecchymosis. No pain with calf compression, swelling, warmth, erythema.   Assessment and Plan:  Status post left foot surgery; injury left ankle  -Treatment options discussed including all alternatives, risks, and complications -At this point given her continuation of symptoms as well as her new development of symptoms I recommended peroneal tendon repair, anastomosis of the peroneal tendons  with possible graft application.  At this point she wants to proceed with this as well.  We will schedule her for next week. -The incision placement as well as the postoperative course was discussed with the patient. I discussed risks of the surgery which include, but not limited to, infection, bleeding, pain, swelling, need for further surgery, delayed or nonhealing, painful or ugly scar, numbness or sensation changes, over/under correction, recurrence, transfer lesions, further deformity, hardware failure, DVT/PE, loss of toe/foot. Patient understands these risks and wishes to proceed with surgery. The surgical consent was reviewed with the patient all 3 pages were signed. No promises or guarantees were given to the outcome of the procedure. All questions were answered to the best of my ability. Before the surgery the patient was encouraged to call the office if there is any further questions. The surgery will be performed at the [facility] on an outpatient basis.  Trula Slade DPM

## 2021-06-25 ENCOUNTER — Encounter: Payer: 59 | Admitting: Podiatry

## 2021-06-26 ENCOUNTER — Ambulatory Visit: Payer: 59

## 2021-06-26 ENCOUNTER — Telehealth: Payer: Self-pay

## 2021-06-26 NOTE — Telephone Encounter (Signed)
DOS 06/27/2021  REPAIR PERONEAL TENDON LT - [id number]  [facility]  RECEIVED AUTH FAX FROM [facility]. NO PRIOR AUTH REQUIRED FOR CPT [phone]

## 2021-06-27 ENCOUNTER — Encounter: Payer: Self-pay | Admitting: Podiatry

## 2021-06-27 ENCOUNTER — Other Ambulatory Visit: Payer: Self-pay | Admitting: Podiatry

## 2021-06-27 DIAGNOSIS — S96912D Strain of unspecified muscle and tendon at ankle and foot level, left foot, subsequent encounter: Secondary | ICD-10-CM | POA: Diagnosis not present

## 2021-06-27 MED ORDER — RIVAROXABAN 10 MG PO TABS: 10.0000 mg | ORAL_TABLET | Freq: Every day | ORAL | 0 refills | Status: DC

## 2021-06-27 MED ORDER — ONDANSETRON HCL 4 MG PO TABS: 4.0000 mg | ORAL_TABLET | Freq: Three times a day (TID) | ORAL | 0 refills | Status: DC | PRN

## 2021-06-27 MED ORDER — CEPHALEXIN 500 MG PO CAPS: 500.0000 mg | ORAL_CAPSULE | Freq: Three times a day (TID) | ORAL | 0 refills | Status: DC

## 2021-06-27 MED ORDER — OXYCODONE-ACETAMINOPHEN 10-325 MG PO TABS: 1.0000 | ORAL_TABLET | Freq: Four times a day (QID) | ORAL | 0 refills | Status: DC | PRN

## 2021-06-27 NOTE — Progress Notes (Signed)
Postop medications sent 

## 2021-06-28 ENCOUNTER — Ambulatory Visit: Payer: 59 | Admitting: Podiatry

## 2021-06-29 ENCOUNTER — Encounter: Payer: Self-pay | Admitting: Podiatry

## 2021-06-29 ENCOUNTER — Telehealth: Payer: Self-pay | Admitting: *Deleted

## 2021-06-29 NOTE — Telephone Encounter (Signed)
Called and spoke with the patient yesterday and stated that I was calling to see how the patient was doing after having surgery with Dr Jacqualyn Posey and patient stated that she was doing ok and the nerve block was still doing good and little bit of nausea but not much and had medicine for that and was staying off of it and icing and elevating and was using the knee scooter and I stated to call the office if any concerns or questions. Lattie Haw

## 2021-07-01 ENCOUNTER — Other Ambulatory Visit: Payer: Self-pay | Admitting: Podiatry

## 2021-07-01 ENCOUNTER — Encounter: Payer: Self-pay | Admitting: Podiatry

## 2021-07-02 ENCOUNTER — Other Ambulatory Visit: Payer: Self-pay | Admitting: Podiatry

## 2021-07-02 MED ORDER — OXYCODONE HCL 15 MG PO TABS: 15.0000 mg | ORAL_TABLET | Freq: Four times a day (QID) | ORAL | 0 refills | Status: DC | PRN

## 2021-07-02 NOTE — Telephone Encounter (Signed)
Please advise. Dr. Jacqualyn Posey is out of the office today.

## 2021-07-03 ENCOUNTER — Encounter: Payer: Self-pay | Admitting: Podiatry

## 2021-07-03 ENCOUNTER — Other Ambulatory Visit: Payer: Self-pay

## 2021-07-03 ENCOUNTER — Ambulatory Visit (INDEPENDENT_AMBULATORY_CARE_PROVIDER_SITE_OTHER): Payer: 59 | Admitting: Podiatry

## 2021-07-03 VITALS — Temp 97.7°F | Resp 14

## 2021-07-03 DIAGNOSIS — S96919D Strain of unspecified muscle and tendon at ankle and foot level, unspecified foot, subsequent encounter: Secondary | ICD-10-CM

## 2021-07-03 DIAGNOSIS — Z9889 Other specified postprocedural states: Secondary | ICD-10-CM

## 2021-07-10 ENCOUNTER — Other Ambulatory Visit: Payer: Self-pay | Admitting: Podiatry

## 2021-07-10 MED ORDER — HYDROCODONE-ACETAMINOPHEN 5-325 MG PO TABS: 1.0000 | ORAL_TABLET | Freq: Three times a day (TID) | ORAL | 0 refills | Status: DC | PRN

## 2021-07-10 NOTE — Progress Notes (Signed)
Subjective: Haley White is a 65 y.o. is seen today in office s/p left peroneal tendon repair, anastomosis.  She states that she still having quite a bit of pain.  Pain meds has not been helpful.  No recent injury or falls or changes.  No other concerns today.  No fevers or chills.   Objective: General: No acute distress, AAOx3  DP/PT pulses palpable 2/4, CRT < 3 sec to all digits.  Protective sensation intact. Motor function intact.  Left foot: Incision is well coapted without any evidence of dehiscence with sutures, staples intact. There is no surrounding erythema, ascending cellulitis, fluctuance, crepitus, malodor, drainage/purulence. There is mild to moderate edema around the surgical site. There is pain along the surgical site.  No other areas of tenderness to bilateral lower extremities.  No other open lesions or pre-ulcerative lesions.  No pain with calf compression, swelling, warmth, erythema.   Assessment and Plan:  Status post left peroneal tendon anastomosis, doing well with no complications   -Treatment options discussed including all alternatives, risks, and complications -Incision was cleaned.  Small amount of antibiotic ointment and dressing applied.  Will remain in the cam boot for now.  I discussed with her cast with given her pain and has not put her into a cast today.  Continue nonweightbearing. -We will increase oxycodone 15 mg. -Ice/elevation -Monitor for any clinical signs or symptoms of infection and DVT/PE and directed to call the office immediately should any occur or go to the ER. -Follow-up as scheduled or sooner if any problems arise. In the meantime, encouraged to call the office with any questions, concerns, change in symptoms.   Celesta Gentile, DPM

## 2021-07-10 NOTE — Progress Notes (Signed)
error 

## 2021-07-12 ENCOUNTER — Other Ambulatory Visit: Payer: Self-pay

## 2021-07-12 ENCOUNTER — Ambulatory Visit (INDEPENDENT_AMBULATORY_CARE_PROVIDER_SITE_OTHER): Payer: 59 | Admitting: Podiatry

## 2021-07-12 DIAGNOSIS — S96919D Strain of unspecified muscle and tendon at ankle and foot level, unspecified foot, subsequent encounter: Secondary | ICD-10-CM

## 2021-07-12 DIAGNOSIS — Z9889 Other specified postprocedural states: Secondary | ICD-10-CM

## 2021-07-15 ENCOUNTER — Encounter: Payer: Self-pay | Admitting: Podiatry

## 2021-07-15 ENCOUNTER — Other Ambulatory Visit: Payer: Self-pay | Admitting: Podiatry

## 2021-07-18 NOTE — Progress Notes (Signed)
Subjective: Haley White is a 65 y.o. is seen today in office s/p left peroneal tendon repair, anastomosis.  States that she is feeling better today.  Pain is also improved.  She denies any fevers or chills.  She been nonweightbearing in a cam boot.  No other concerns today.   Objective: General: No acute distress, AAOx3  DP/PT pulses palpable 2/4, CRT < 3 sec to all digits.  Protective sensation intact. Motor function intact.  Left foot: Incision is well coapted without any evidence of dehiscence with sutures, staples intact. There is no surrounding erythema, ascending cellulitis, fluctuance, crepitus, malodor, drainage/purulence. There is mild and improved edema around the surgical site. There is pain along the surgical site.  No other areas of tenderness to bilateral lower extremities.  No other open lesions or pre-ulcerative lesions.  No pain with calf compression, swelling, warmth, erythema.   Assessment and Plan:  Status post left peroneal tendon anastomosis, doing well with no complications   -Treatment options discussed including all alternatives, risks, and complications -At this point as her pain has improved as well as the swelling we did apply a fiberglass cast. Below-knee fiberglass cast was applied making sure to pad all bony prominences. -Continue nonweightbearing -Working to wean off the pain medication. -Ice/elevation -Monitor for any clinical signs or symptoms of infection and DVT/PE and directed to call the office immediately should any occur or go to the ER. -Follow-up as scheduled or sooner if any problems arise. In the meantime, encouraged to call the office with any questions, concerns, change in symptoms.   Celesta Gentile, DPM

## 2021-07-21 ENCOUNTER — Encounter: Payer: Self-pay | Admitting: Podiatry

## 2021-07-22 ENCOUNTER — Other Ambulatory Visit: Payer: Self-pay | Admitting: Podiatry

## 2021-07-22 MED ORDER — TRAMADOL HCL 50 MG PO TABS
50.0000 mg | ORAL_TABLET | Freq: Two times a day (BID) | ORAL | 0 refills | Status: AC | PRN
Start: 2021-07-22 — End: 2021-07-27

## 2021-07-24 ENCOUNTER — Other Ambulatory Visit: Payer: Self-pay | Admitting: Adult Health

## 2021-07-26 ENCOUNTER — Ambulatory Visit (INDEPENDENT_AMBULATORY_CARE_PROVIDER_SITE_OTHER): Payer: 59 | Admitting: Podiatry

## 2021-07-26 ENCOUNTER — Other Ambulatory Visit: Payer: Self-pay

## 2021-07-26 DIAGNOSIS — S96919D Strain of unspecified muscle and tendon at ankle and foot level, unspecified foot, subsequent encounter: Secondary | ICD-10-CM | POA: Diagnosis not present

## 2021-07-26 DIAGNOSIS — Z9889 Other specified postprocedural states: Secondary | ICD-10-CM

## 2021-07-30 ENCOUNTER — Ambulatory Visit: Payer: 59 | Admitting: Adult Health

## 2021-07-31 ENCOUNTER — Ambulatory Visit: Payer: 59 | Admitting: Nurse Practitioner

## 2021-08-01 NOTE — Progress Notes (Signed)
Subjective: Haley White is a 65 y.o. is seen today in office s/p left peroneal tendon repair, anastomosis.  She states the cast is feeling much better than boot.  She is been nonweightbearing.  Still gets occasional discomfort but doing better.  No fevers or chills.  No other concerns.    Objective: General: No acute distress, AAOx3  DP/PT pulses palpable 2/4, CRT < 3 sec to all digits.  Protective sensation intact. Motor function intact.  Left foot: Incision is well coapted without any evidence of dehiscence the wound is healing.  There is minimal edema there is no surrounding erythema, ascending cellulitis there is no fluctuance or crepitation.  No malodor.  No obvious signs of infection.  No other areas of discomfort.   No pain with calf compression, swelling, warmth, erythema.   Assessment and Plan:  Status post left peroneal tendon anastomosis, doing well with no complications   -Treatment options discussed including all alternatives, risks, and complications -Today cast was changed.  Incisions healing well.  Swelling is also improved.  Still in some discomfort.  New well-padded below-knee fiberglass cast was applied making sure to pad all bony prominences. -Continue nonweightbearing -Ice/elevation -Monitor for any clinical signs or symptoms of infection and DVT/PE and directed to call the office immediately should any occur or go to the ER. -Follow-up as scheduled or sooner if any problems arise. In the meantime, encouraged to call the office with any questions, concerns, change in symptoms.   Celesta Gentile, DPM

## 2021-08-10 ENCOUNTER — Other Ambulatory Visit: Payer: Self-pay

## 2021-08-10 ENCOUNTER — Ambulatory Visit (INDEPENDENT_AMBULATORY_CARE_PROVIDER_SITE_OTHER): Payer: 59 | Admitting: Podiatry

## 2021-08-10 DIAGNOSIS — S96919D Strain of unspecified muscle and tendon at ankle and foot level, unspecified foot, subsequent encounter: Secondary | ICD-10-CM

## 2021-08-10 DIAGNOSIS — Z9889 Other specified postprocedural states: Secondary | ICD-10-CM

## 2021-08-10 NOTE — Patient Instructions (Signed)
Celecoxib Capsules What is this medication? CELECOXIB (sell a KOX ib) treats mild to moderate pain, inflammation, or arthritis. It belongs to a group of medications called NSAIDs. This medicine may be used for other purposes; ask your health care provider or pharmacist if you have questions. COMMON BRAND NAME(S): Celebrex What should I tell my care team before I take this medication? They need to know if you have any of these conditions: Bleeding disorders Coronary artery bypass graft (CABG) within the past 2 weeks Heart attack Heart disease Heart failure High blood pressure High levels of potassium in the blood If you often drink alcohol Kidney disease Liver disease Low red blood cell counts Lung or breathing disease (asthma) Receiving steroids like dexamethasone or prednisone Smoke cigarettes Stomach bleeding Stomach or intestine problems Take medications that treat or prevent blood clots An unusual or allergic reaction to celecoxib, other medications, foods, dyes, or preservatives Pregnant or trying to get pregnant Breast-feeding How should I use this medication? Take this medication by mouth with water. Take it as directed on the prescription label at the same time every day. Do not cut, crush or chew this medication. Swallow the capsules whole. You may open the capsule and put the contents in 1 teaspoon of applesauce. Swallow the medication and applesauce right away. Do not chew the medication or applesauce. Keep taking it unless your care team tells you to stop. A special MedGuide will be given to you by the pharmacist with each prescription and refill. Be sure to read this information carefully each time. Talk to your care team regarding the use of this medication in children. While this medication may be prescribed for children as young as 37 years old for selected conditions, precautions do apply. Patients over 81 years of age may have a stronger reaction and need a smaller  dose. Overdosage: If you think you have taken too much of this medicine contact a poison control center or emergency room at once. NOTE: This medicine is only for you. Do not share this medicine with others. What if I miss a dose? If you miss a dose, take it as soon as you can. If it is almost time for your next dose, take only that dose. Do not take double or extra doses. What may interact with this medication? Do not take this medication with any of the following: Cidofovir Ketorolac Thioridazine This medication may also interact with the following: Alcohol Aspirin and aspirin-like medications Atomoxetine Certain medications for blood pressure, heart disease, irregular heart beat Certain medications for depression, anxiety, or psychotic disorders Certain medications that treat or prevent blood clots like warfarin, enoxaparin, dalteparin, apixaban, dabigatran, and rivaroxaban Cyclosporine Digoxin Diuretics Fluconazole Lithium Methotrexate Other NSAIDs, medications for pain and inflammation, like ibuprofen or naproxen Pemetrexed Rifampin Steroid medications like prednisone or cortisone This list may not describe all possible interactions. Give your health care provider a list of all the medicines, herbs, non-prescription drugs, or dietary supplements you use. Also tell them if you smoke, drink alcohol, or use illegal drugs. Some items may interact with your medicine. What should I watch for while using this medication? Visit your care team for regular checks on your progress. Tell your care team if your symptoms do not start to get better or if they get worse. Do not take other medications that contain aspirin, ibuprofen, or naproxen with this medication. Side effects such as stomach upset, nausea, or ulcers may be more likely to occur. Many non-prescription medications contain aspirin, ibuprofen,  or naproxen. Always read labels carefully. This medication can cause serious ulcers and  bleeding in the stomach. It can happen with no warning. Smoking, drinking alcohol, older age, and poor health can also increase risks. Call your care team right away if you have stomach pain or blood in your vomit or stool. This medication does not prevent a heart attack or stroke. This medication may increase the chance of a heart attack or stroke. The chance may increase the longer you use this medication or if you have heart disease. If you take aspirin to prevent a heart attack or stroke, talk to your care team about using this medication. Alcohol may interfere with the effect of this medication. Avoid alcoholic drinks. This medication may cause serious skin reactions. They can happen weeks to months after starting the medication. Contact your care team right away if you notice fevers or flu-like symptoms with a rash. The rash may be red or purple and then turn into blisters or peeling of the skin. Or, you might notice a red rash with swelling of the face, lips or lymph nodes in your neck or under your arms. Talk to your care team if you are pregnant before taking this medication. Taking this medication between weeks 20 and 30 of pregnancy may harm your unborn baby. Your care team will monitor you closely if you need to take it. After 30 weeks of pregnancy, do not take this medication. You may get drowsy or dizzy. Do not drive, use machinery, or do anything that needs mental alertness until you know how this medication affects you. Do not stand up or sit up quickly, especially if you are an older patient. This reduces the risk of dizzy or fainting spells. Be careful brushing or flossing your teeth or using a toothpick because you may get an infection or bleed more easily. If you have any dental work done, tell your dentist you are receiving this medication. This medication may make it more difficult to get pregnant. Talk to your care team if you are concerned about your fertility. What side effects may I  notice from receiving this medication? Side effects that you should report to your care team as soon as possible: Allergic reactions-skin rash, itching, hives, swelling of the face, lips, tongue, or throat Bleeding-bloody or black, tar-like stools, vomiting blood or brown material that looks like coffee grounds, red or dark brown urine, small red or purple spots on skin, unusual bruising or bleeding Heart attack-pain or tightness in the chest, shoulders, arms, or jaw, nausea, shortness of breath, cold or clammy skin, feeling faint or lightheaded Heart failure-shortness of breath, swelling of ankles, feet, or hands, sudden weight gain, unusual weakness or fatigue Increase in blood pressure Kidney injury-decrease in the amount of urine, swelling of the ankles, hands, or feet Liver injury-right upper belly pain, loss of appetite, nausea, light-colored stool, dark yellow or brown urine, yellowing skin or eyes, unusual weakness or fatigue Rash, fever, and swollen lymph nodes Redness, blistering, peeling, or loosening of the skin, including inside the mouth Stroke-sudden numbness or weakness of the face, arm, or leg, trouble speaking, confusion, trouble walking, loss of balance or coordination, dizziness, severe headache, change in vision Side effects that usually do not require medical attention (report to your care team if they continue or are bothersome): Headache Loss of appetite Nausea Upset stomach This list may not describe all possible side effects. Call your doctor for medical advice about side effects. You may report side  effects to FDA at 1-800-FDA-1088. Where should I keep my medication? Keep out of the reach of children and pets. Store at room temperature between 15 and 30 degrees C (59 and 86 degrees F). Get rid of any unused medication after the expiration date. To get rid of medications that are no longer needed or have expired: Take the medication to a medication take-back program.  Check with your pharmacy or law enforcement to find a location. If you cannot return the medication, check the label or package insert to see if the medication should be thrown out in the garbage or flushed down the toilet. If you are not sure, ask your care team. If it is safe to put it in the trash, empty the medication out of the container. Mix the medication with cat litter, dirt, coffee grounds, or other unwanted substance. Seal the mixture in a bag or container. Put it in the trash. NOTE: This sheet is a summary. It may not cover all possible information. If you have questions about this medicine, talk to your doctor, pharmacist, or health care provider.  2022 Elsevier/Gold Standard (2020-11-17 12:37:09)

## 2021-08-12 ENCOUNTER — Encounter: Payer: Self-pay | Admitting: Podiatry

## 2021-08-14 ENCOUNTER — Encounter: Payer: 59 | Admitting: Podiatry

## 2021-08-15 NOTE — Progress Notes (Signed)
Subjective: Haley White is a 65 y.o. is seen today in office s/p left peroneal tendon repair, anastomosis.  She states that she was doing well and her pain was improving she is wearing the cast.  She states that Really she had a crazy dream and she woke up kicking the bed really hard and since then she has had discomfort but other than that no other injury.  No fevers or chills.  No other concerns.   Objective: General: No acute distress, AAOx3  DP/PT pulses palpable 2/4, CRT < 3 sec to all digits.  Protective sensation intact. Motor function intact.  Left foot: Cast was removed.  Incision is well coapted without any evidence of dehiscence the wound is healing.  Small scab is still present but incision is healed well.  There is no erythema or warmth.  Minimal swelling.  Slight discomfort on the distal portion of the peroneal tendon but no other areas of discomfort.  No pain with calf compression, swelling, warmth, erythema.   Assessment and Plan:  Status post left peroneal tendon anastomosis  -Treatment options discussed including all alternatives, risks, and complications -Cast was removed today.  Incisions healing well.  She can keep moisturizer on the incision.  She is placed into a cam boot today Remain nonweightbearing but we discussed gentle range of motion exercises for the toes and the ankle that she can do.  I will see her back in about 2 weeks and at that point likely for her back to physical therapy.  Continue to ice and elevate as well.  Trula Slade DPM

## 2021-08-16 ENCOUNTER — Encounter: Payer: Self-pay | Admitting: Podiatry

## 2021-08-16 NOTE — Telephone Encounter (Signed)
Please advise 

## 2021-08-22 ENCOUNTER — Other Ambulatory Visit: Payer: Self-pay | Admitting: Adult Health

## 2021-08-22 DIAGNOSIS — I10 Essential (primary) hypertension: Secondary | ICD-10-CM

## 2021-08-28 ENCOUNTER — Other Ambulatory Visit: Payer: Self-pay

## 2021-08-28 ENCOUNTER — Ambulatory Visit (INDEPENDENT_AMBULATORY_CARE_PROVIDER_SITE_OTHER): Payer: 59 | Admitting: Podiatry

## 2021-08-28 ENCOUNTER — Encounter: Payer: Self-pay | Admitting: Podiatry

## 2021-08-28 DIAGNOSIS — S96919D Strain of unspecified muscle and tendon at ankle and foot level, unspecified foot, subsequent encounter: Secondary | ICD-10-CM

## 2021-08-28 DIAGNOSIS — Z9889 Other specified postprocedural states: Secondary | ICD-10-CM

## 2021-08-28 MED ORDER — CELECOXIB 100 MG PO CAPS: 100.0000 mg | ORAL_CAPSULE | Freq: Two times a day (BID) | ORAL | 0 refills | Status: DC

## 2021-08-28 NOTE — Telephone Encounter (Signed)
Please advise 

## 2021-08-30 ENCOUNTER — Other Ambulatory Visit: Payer: Self-pay

## 2021-08-30 ENCOUNTER — Ambulatory Visit: Payer: 59 | Attending: Podiatry

## 2021-08-30 DIAGNOSIS — S96919D Strain of unspecified muscle and tendon at ankle and foot level, unspecified foot, subsequent encounter: Secondary | ICD-10-CM | POA: Insufficient documentation

## 2021-08-30 DIAGNOSIS — M25672 Stiffness of left ankle, not elsewhere classified: Secondary | ICD-10-CM | POA: Diagnosis present

## 2021-08-30 DIAGNOSIS — M6281 Muscle weakness (generalized): Secondary | ICD-10-CM | POA: Insufficient documentation

## 2021-08-30 DIAGNOSIS — R262 Difficulty in walking, not elsewhere classified: Secondary | ICD-10-CM | POA: Insufficient documentation

## 2021-08-30 DIAGNOSIS — M79672 Pain in left foot: Secondary | ICD-10-CM | POA: Insufficient documentation

## 2021-08-30 NOTE — Patient Instructions (Addendum)
   [id number]

## 2021-08-30 NOTE — Progress Notes (Signed)
Subjective: Haley White is a 65 y.o. is seen today in office s/p left peroneal tendon repair, anastomosis.  She is overall she has been doing better.  She feels that she has had some discomfort after she was trying to put on her pants and she did twist her left ankle some but no other injury that she reports.  She gets occasional throbbing.  Swelling intermittently.  She denies any fevers or chills.  No other concerns.   Objective: General: No acute distress, AAOx3  DP/PT pulses palpable 2/4, CRT < 3 sec to all digits.  Protective sensation intact. Motor function intact.  Left foot:  Incision is well coapted without any evidence of dehiscence the wound is healing.  Scar is formed.  There is mild edema present on surgical site there is no erythema or warmth.  Mild discomfort to palpation at surgical site.  No other areas of discomfort identified at this time.  MMT 4/5. No pain with calf compression, swelling, warmth, erythema.   Assessment and Plan:  Status post left peroneal tendon anastomosis  -Treatment options discussed including all alternatives, risks, and complications -Overall she is been doing better and making progress.  We will start physical therapy.  Discussed transition to partial weightbearing in the cam boot.  Continue ice and elevate as well as compression to help with any postoperative edema.  Return in about 3 weeks (around 09/18/2021).  Trula Slade DPM

## 2021-08-30 NOTE — Therapy (Signed)
[place], [place], [address] Phone: [phone]   Fax:  [phone]  Physical Therapy Evaluation  Patient Details  Name: Haley White MRN: [id number] Date of Birth: 02/07/1956 Referring Provider (PT): Trula Slade, [place]   Encounter Date: 08/30/2021   PT End of Session - 08/30/21 1410     Visit Number 1    Number of Visits 17    Date for PT Re-Evaluation 10/25/21    Authorization Type [employer]: 30 VL; 1 visit remaining    Authorization - Visit Number 29    Authorization - Number of Visits 30    PT Start Time 7322    PT Stop Time 1405    PT Time Calculation (min) 50 min    Equipment Utilized During Treatment Other (comment)   CAM boot, BIL axillary crutches   Activity Tolerance Patient limited by pain    Behavior During Therapy [facility] for tasks assessed/performed             Past Medical History:  Diagnosis Date   Anxiety    Arthritis    Asthma    Cataract    Gout    Hyperlipidemia    Hypertension    Obese     Past Surgical History:  Procedure Laterality Date   APPENDECTOMY  1984   CATARACT EXTRACTION W/ INTRAOCULAR LENS  IMPLANT, BILATERAL  2014   [place]   X3   COLONOSCOPY  11/16/2013   w/[place]    FOOT SURGERY Left 2002   [place] Right 2007   TUBAL LIGATION  1994    There were no vitals filed for this visit.    Subjective Assessment - 08/30/21 1327     Subjective Pt reports to clinic with primary c/o L ankle pain and stiffness following a L peroneal tendon anastomosis on 06/27/2021 after slip and fall while on vacation in [place]. This was preceded by another L peroneal tendon anastomosis and gastrocnemius resection on 01/31/2021 due to degenerative causes. She reports that he hasn't walked much at all since March. She ambulates into clinic today with a CAM boot on her L foot and two axillary crutches. She  reports that utilizes a knee scooter for most of her locomotion at home. Her orthopedic surgeon told her to utilize WBAT precautions moving forward with her crutches. She reports current pain of 4/10. Worst pain is 8-10/10. Best pain is 0/10. Aggravating factors include prolonged weight-bearing through foot. Relieving factors include pain medication and ice. She denies any unexplained weight change, N/T, bowel or bladder changes, or unrelenting night pain.    Pertinent History two L ankle surgeries within 8 months; HTN; anxiety; global OA    Limitations Standing;Walking;House hold activities;Lifting;Sitting    How long can you sit comfortably? Unlimited with foot elevated    How long can you stand comfortably? 3-5 minutes with axillary crutches    How long can you walk comfortably? 3-4 minutes with axillary crutches    Diagnostic tests 06/06/2021: MRI L Ankle Without Contrast: IMPRESSION:  1. 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  Long segment longitudinal split tear of the peroneus brevis tendon.  Peroneus longus tendon is laterally subluxed.  2. 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  3. 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    Patient  Stated Goals Walking, household chores    Currently in Pain? Yes    Pain Score 4     Pain Location Ankle    Pain Orientation Left;Anterior;Lateral    Pain Descriptors / Indicators Aching;Sharp;Tightness    Pain Type Chronic pain;Surgical pain    Pain Onset More than a month ago    Pain Frequency Intermittent    Aggravating Factors  Prolonged weight-bearing    Pain Relieving Factors ice, rest, pain medication    Effect of Pain on Daily Activities See patient goals                [facility] PT Assessment - 08/30/21 0001       Assessment   Medical Diagnosis Tendon tear, ankle, subsequent encounter (O27.741O)     Referring Provider (PT) Trula Slade, DPM      Restrictions   LLE Weight Bearing Weight bearing as tolerated      Balance Screen   Has the patient fallen in the past 6 months Yes    How many times? 1   Leading to her ankle injury/ subsequent surgery   Has the patient had a decrease in activity level because of a fear of falling?  No    Is the patient reluctant to leave their home because of a fear of falling?  No      Home Environment   Living Environment Private residence    Living Arrangements Alone    Available Help at Discharge Friend(s);Family    Additional Comments 2 steps to enter      Prior Function   Level of Independence Independent    Vocation Retired    Leisure Walking, reading, Higher education careers adviser   Overall Cognitive Status Within Functional Limits for tasks assessed      Observation/Other Assessments   Focus on Therapeutic Outcomes (FOTO)  43%, projected 55%      AROM   Right Ankle Dorsiflexion 10    Right Ankle Inversion 32    Right Ankle Eversion 20    Left Ankle Dorsiflexion 20    Left Ankle Inversion 18   pain   Left Ankle Eversion 4   pain     Strength   Overall Strength Comments ankle MMT deferred due to pain with AROM    Right Hip Flexion 5/5    Right Hip Extension 4/5    Right Hip ABduction 4-/5    Left Hip Flexion 5/5    Left Hip Extension 4/5    Left Hip ABduction 4-/5      Palpation   Palpation comment diffuse tenderness about lateral ankle, most notable along peroneal brevis tendon, directly over surgical incision      Ambulation/Gait   Gait Comments Pt ambulates with step-to pattern with BIL axillary crutches and L CAM Boot                        Objective measurements completed on examination: See above findings.                PT Education - 08/30/21 1404     Education Details Discussed pt's POC, proper form with HEP, and importance of surgical scar mobilization    Person(s) Educated Patient     Methods Explanation;Demonstration;Handout    Comprehension Returned demonstration;Verbalized understanding              PT Short Term Goals - 08/30/21 1419  PT SHORT TERM GOAL #1   Title Pt will report understanding and adherence to her HEP in order to promote independence in the management of her primary impairments.    Baseline issued at eval.    Time 4    Period Weeks    Status New    Target Date 09/27/21      PT SHORT TERM GOAL #2   Title Pt will demonstrate ability to tolerate lateral weightshifting in standing in order to transition to walking with less limitation.    Baseline Pt is unable to bear weight through L ankle due to pain.    Time 4    Period Weeks    Status New               PT Long Term Goals - 08/30/21 1421       PT LONG TERM GOAL #1   Title Pt will achieve a FOTO score of 55% or higher in order to demonstrate improved functional ability as it relates to her L ankle pain.    Baseline 43%    Time 8    Period Weeks    Status New    Target Date 10/25/21      PT LONG TERM GOAL #2   Title Pt will demonstrate the ability to perform a 6-minute walk test without CAM boot or AD at self-selected pace in order to progress to safe community ambulation.    Baseline Pt unable to walk without CAM boot and axillary crutches.    Time 8    Period Weeks    Status New    Target Date 10/25/21      PT LONG TERM GOAL #3   Title Pt will demonstrate L ankle eversion and inversion of 20 degrees with 0-2/10 pain in order to put on her socks and shoes without limitation.    Baseline See flowsheet    Time 8    Period Weeks    Status New    Target Date 10/25/21      PT LONG TERM GOAL #4   Title Pt will demonstrate BIL global hip strength of 5/5 in order to independently progress her LE strengthening regimen without limitation.    Baseline See flowsheet8    Period Weeks    Status New    Target Date 10/25/21                    Plan -  08/30/21 1414     Clinical Impression Statement Pt is a pleasant 65yo F who presents with primary c/o L ankle pain and stiffness s/p L peroneal tendon anastomosis on 06/27/2021. Upon assessment, the pt's primary impairments include painful and limited L ankle eversion and inversion AROM, TTP to the L lateral ankle, difficulty weight-bearing through the L ankle (Pt is WBAT per orthopedic surgeon's instructions), and functional limitations in standing and walking. Due to pt's post-op status and pain with active ankle movements, the session was limited. Will assess ankle passive accessories, balance testing, and manual muscle testing at a later date when pt is able to tolerate these assessments. The pt will benefit from skilled PT to address her primary impairments and return to her prior level of function without limitation due to pain.    Personal Factors and Comorbidities Comorbidity 3+;Past/Current Experience    Comorbidities OA, HTN, anxety, multiple ankle injuries/ surgeries    Examination-Activity Limitations Carry;Lift;Locomotion Level;Stairs;Stand;Sit;Transfers;Squat    Examination-Participation Restrictions Church;Cleaning;Community Activity;Laundry;Meal Prep;Shop    Stability/Clinical  Decision Making Evolving/Moderate complexity    Clinical Decision Making Moderate    Rehab Potential Good    PT Frequency 2x / week    PT Duration 8 weeks    PT Treatment/Interventions ADLs/Self Care Home Management;Cryotherapy;Software engineer;Therapeutic activities;Therapeutic exercise;Balance training;Neuromuscular re-education;Patient/family education;Manual techniques;Passive range of motion;Dry needling;Taping;Vasopneumatic Device;Stair training;Aquatic Therapy;Iontophoresis 4mg /ml Dexamethasone    PT Next Visit Plan Progress closed chain ankle AROM/ light strengthening; introduce closed-chain exercises as able    PT Home Exercise Plan Access Code [email or web address]    Recommended Other  Services Aquatic therapy    Consulted and Agree with Plan of Care Patient             Patient will benefit from skilled therapeutic intervention in order to improve the following deficits and impairments:  Abnormal gait, Decreased range of motion, Difficulty walking, Decreased endurance, Decreased activity tolerance, Pain, Decreased balance, Impaired flexibility, Decreased strength, Increased edema  Visit Diagnosis: Tendon tear, ankle, unspecified laterality, subsequent encounter  Pain in left foot  Difficulty in walking, not elsewhere classified  Stiffness of left ankle, not elsewhere classified  Muscle weakness (generalized)     Problem List Patient Active Problem List   Diagnosis Date Noted   Allergic rhinitis 03/15/2021   Allergic rhinitis due to animal (cat) (dog) hair and dander 03/15/2021   Allergic rhinitis due to pollen 03/15/2021   Gastro-esophageal reflux disease without esophagitis 03/15/2021   Mild persistent asthma, uncomplicated 30/13/1438   Aortic atherosclerosis ([place]) by Chest CT scan on 02/02/2018  01/10/2021   Asthma    Low back pain 03/17/2019   Lumbosacral spondylosis without myelopathy 03/17/2019   Cubital tunnel syndrome 10/14/2018   Closed fracture of distal end of right radius 07/09/2018   Pseudogout 12/09/2017   Major depressive disorder, recurrent, severe without psychotic features ([place])    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

## 2021-08-31 ENCOUNTER — Encounter (HOSPITAL_BASED_OUTPATIENT_CLINIC_OR_DEPARTMENT_OTHER): Payer: Self-pay

## 2021-08-31 ENCOUNTER — Emergency Department (HOSPITAL_BASED_OUTPATIENT_CLINIC_OR_DEPARTMENT_OTHER): Payer: 59 | Admitting: Radiology

## 2021-08-31 DIAGNOSIS — S99912A Unspecified injury of left ankle, initial encounter: Secondary | ICD-10-CM | POA: Insufficient documentation

## 2021-08-31 DIAGNOSIS — I2510 Atherosclerotic heart disease of native coronary artery without angina pectoris: Secondary | ICD-10-CM | POA: Insufficient documentation

## 2021-08-31 DIAGNOSIS — J45909 Unspecified asthma, uncomplicated: Secondary | ICD-10-CM | POA: Diagnosis not present

## 2021-08-31 DIAGNOSIS — Z79899 Other long term (current) drug therapy: Secondary | ICD-10-CM | POA: Diagnosis not present

## 2021-08-31 DIAGNOSIS — I10 Essential (primary) hypertension: Secondary | ICD-10-CM | POA: Diagnosis not present

## 2021-08-31 DIAGNOSIS — Z7951 Long term (current) use of inhaled steroids: Secondary | ICD-10-CM | POA: Diagnosis not present

## 2021-08-31 DIAGNOSIS — Y92009 Unspecified place in unspecified non-institutional (private) residence as the place of occurrence of the external cause: Secondary | ICD-10-CM | POA: Insufficient documentation

## 2021-08-31 DIAGNOSIS — Z87891 Personal history of nicotine dependence: Secondary | ICD-10-CM | POA: Insufficient documentation

## 2021-08-31 IMAGING — DX DG ANKLE COMPLETE 3+V*L*
1 series · 3 of 3 positions shown · non-contrast
Comparison: Ankle radiograph [DATE]

CLINICAL DATA: Left ankle injury.  Postop 6 weeks.

EXAM:
LEFT ANKLE COMPLETE - 3+ VIEW

[Series 1: ankle · 0.14mm/px · 3 of 3 slices shown]
[im 1/3]
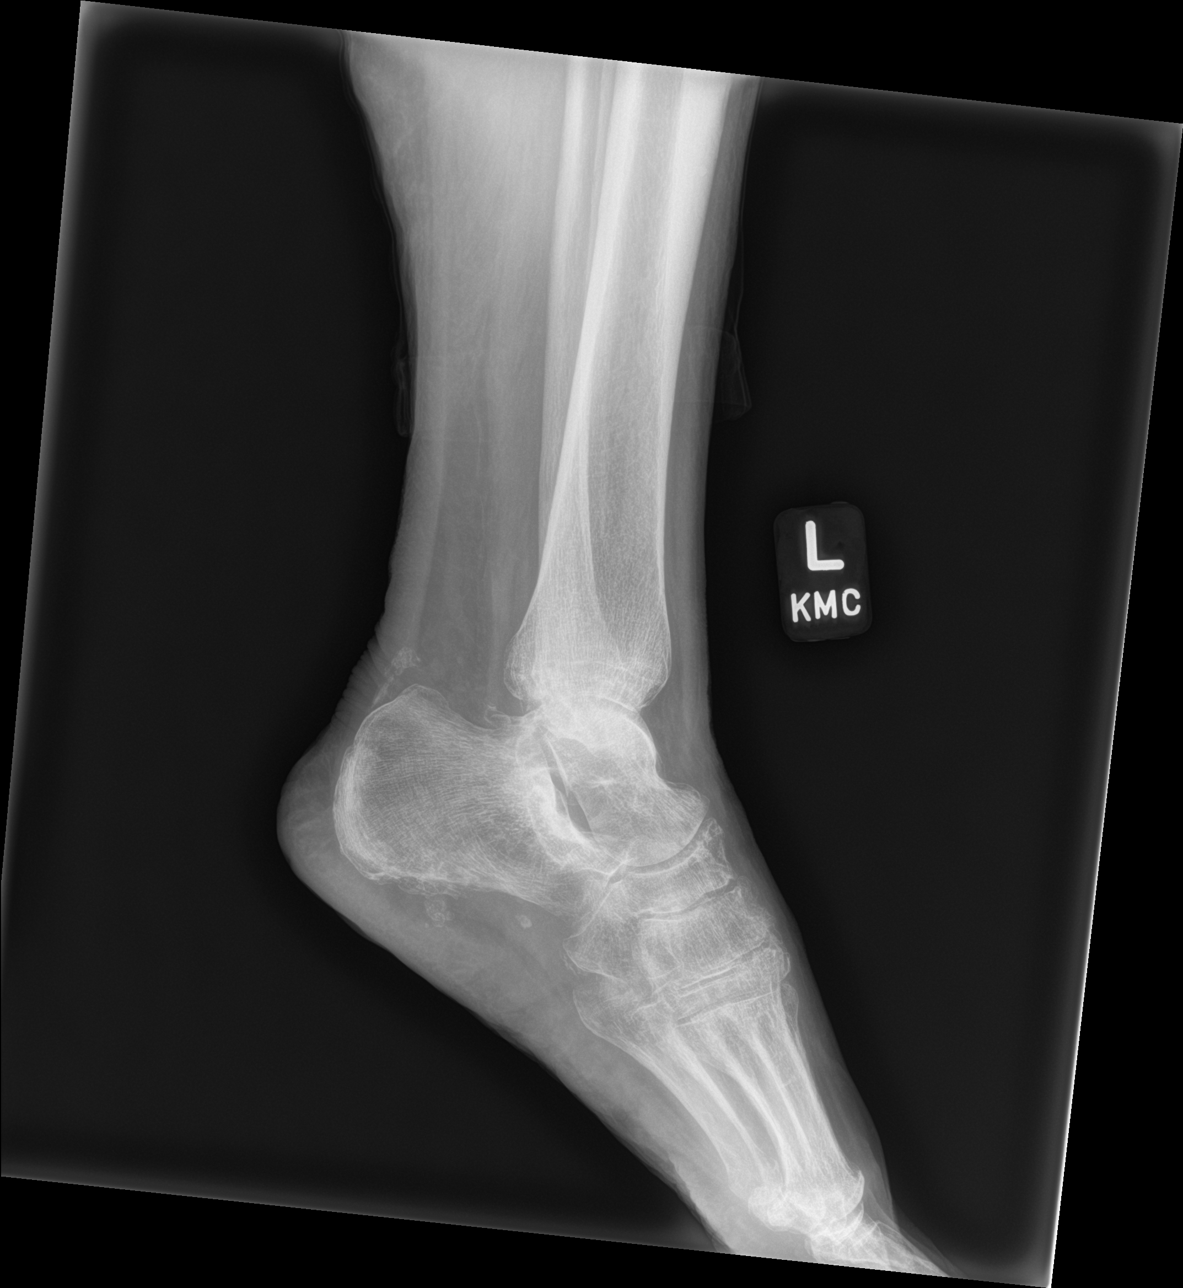
[im 2/3]
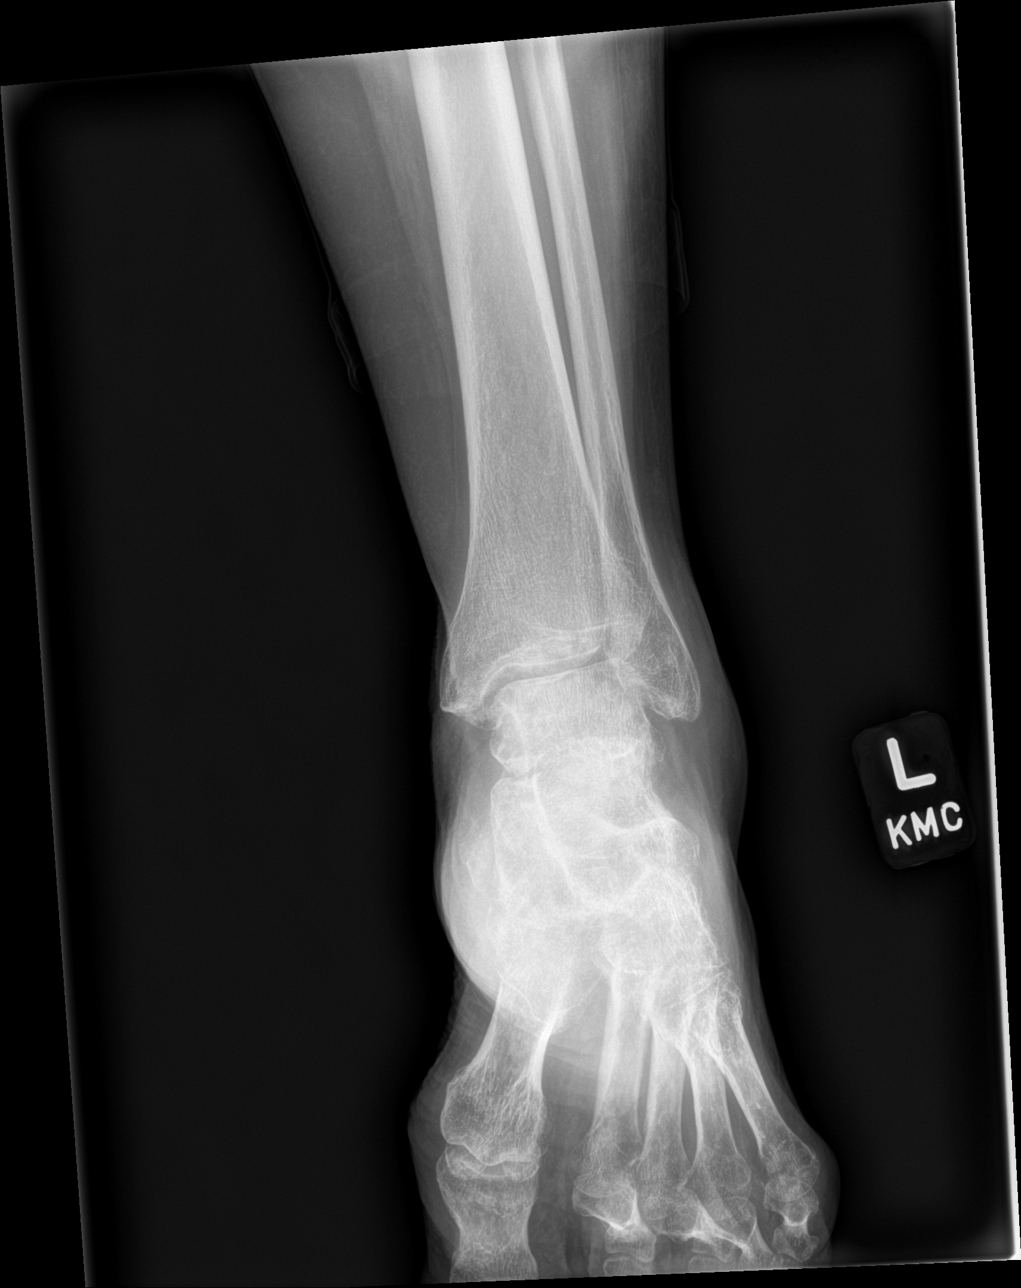
[im 3/3]
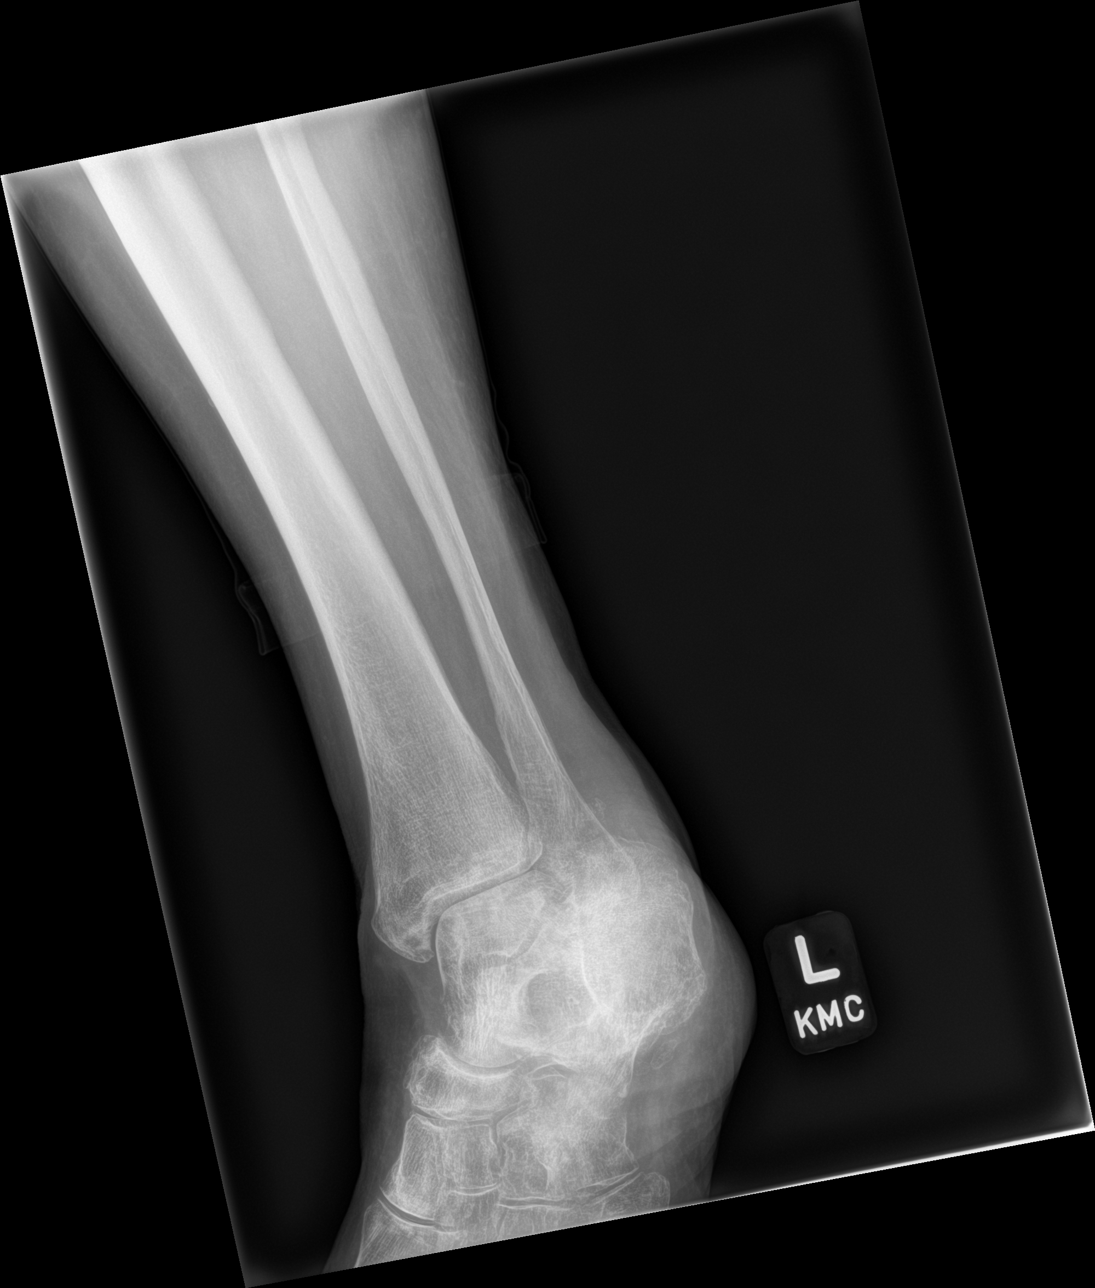

[3 of 3 positions shown; findings below may reference images not displayed]

FINDINGS: No fracture or dislocation. Generalized osteopenia which is likely
due to disuse. Chronic fragmentation of the dorsal navicular with
spurring. Chronic fragmentation of the Achilles tendon and plantar
calcaneus. No ankle joint effusion. There is mild lateral soft
tissue edema.
IMPRESSION: Mild lateral soft tissue edema. No acute or healing fracture.

Chronic changes are stable from prior radiograph.

## 2021-08-31 NOTE — ED Triage Notes (Addendum)
Pt was using her knee scooter in home when it got caught on a rug and she heard a "pop" of her left ankle. Pt has had two surgeries on that ankle, most recent six weeks ago. Pt received 200 mcg of Fentanyl en route. Ankle currently elevated with ice.

## 2021-09-01 ENCOUNTER — Emergency Department (HOSPITAL_BASED_OUTPATIENT_CLINIC_OR_DEPARTMENT_OTHER)
Admission: EM | Admit: 2021-09-01 | Discharge: 2021-09-01 | Disposition: A | Discharge status: Home / Self Care | Payer: 59 | Attending: Emergency Medicine | Admitting: Emergency Medicine

## 2021-09-01 ENCOUNTER — Encounter: Payer: Self-pay | Admitting: Podiatry

## 2021-09-01 DIAGNOSIS — S99912A Unspecified injury of left ankle, initial encounter: Secondary | ICD-10-CM

## 2021-09-01 MED ORDER — KETOROLAC TROMETHAMINE 30 MG/ML IJ SOLN
15.0000 mg | Freq: Once | INTRAMUSCULAR | Status: AC
  Administered 2021-09-01: 15 mg via INTRAVENOUS
  Filled 2021-09-01: qty 1

## 2021-09-01 MED ORDER — OXYCODONE-ACETAMINOPHEN 5-325 MG PO TABS
2.0000 | ORAL_TABLET | Freq: Once | ORAL | Status: AC
Start: 2021-09-01 — End: 2021-09-01
  Administered 2021-09-01: 2 via ORAL
  Filled 2021-09-01: qty 2

## 2021-09-01 MED ORDER — FENTANYL CITRATE PF 50 MCG/ML IJ SOSY
100.0000 ug | PREFILLED_SYRINGE | Freq: Once | INTRAMUSCULAR | Status: AC
  Administered 2021-09-01: 100 ug via INTRAVENOUS
  Filled 2021-09-01: qty 2

## 2021-09-01 MED ORDER — ONDANSETRON HCL 4 MG/2ML IJ SOLN
4.0000 mg | Freq: Once | INTRAMUSCULAR | Status: AC
  Administered 2021-09-01: 4 mg via INTRAVENOUS
  Filled 2021-09-01: qty 2

## 2021-09-01 MED ORDER — HYDROCODONE-ACETAMINOPHEN 5-325 MG PO TABS: 1.0000 | ORAL_TABLET | Freq: Four times a day (QID) | ORAL | 0 refills | Status: DC | PRN

## 2021-09-01 NOTE — Discharge Instructions (Addendum)
Keep the ankle elevated.  You can place ice on ankle.  Use the pain medicine as needed.  Please call your podiatrist Monday morning You can also take ibuprofen every 6-8 hrs for pain as well

## 2021-09-01 NOTE — ED Provider Notes (Signed)
[facility]ERG[facility] DEPT Provider Note   CSN: [id number] Arrival date & time: 08/31/21  2102     History Chief Complaint  Patient presents with   Ankle Pain    Haley White is a 65 y.o. female.  The history is provided by the patient and a relative.  Ankle Pain Location:  Ankle Ankle location:  L ankle Pain details:    Quality:  Aching   Radiates to:  Does not radiate   Severity:  Severe   Onset quality:  Sudden   Timing:  Constant   Progression:  Worsening Chronicity:  New Relieved by:  Nothing Exacerbated by: movement and palpation. Associated symptoms: no fever and no neck pain   Patient presents for left ankle pain.  Patient required tendon surgery on the left ankle over 6 weeks ago.  She has been using a home scooter for mobility.  She reports tonight she was using a knee scooter when it got caught on a rug and she fell the ground and she heard a pop in her left ankle.  She reports persistent pain in the left ankle.  No other injuries reported    Past Medical History:  Diagnosis Date   Anxiety    Arthritis    Asthma    Cataract    Gout    Hyperlipidemia    Hypertension    Obese     Patient Active Problem List   Diagnosis Date Noted   Allergic rhinitis 03/15/2021   Allergic rhinitis due to animal (cat) (dog) hair and dander 03/15/2021   Allergic rhinitis due to pollen 03/15/2021   Gastro-esophageal reflux disease without esophagitis 03/15/2021   Mild persistent asthma, uncomplicated 78/29/5621   Aortic atherosclerosis (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   Suicide attempt by drug ingestion ([place])     Medication management 08/22/2014   Vitamin D deficiency 08/22/2014   Hyperlipidemia, mixed 08/22/2014   Hypertension    Anxiety    Abnormal glucose    Class 2 severe obesity due to excess calories with serious comorbidity and body mass index (BMI) of 37.0 to 37.9 in adult [facility])     Past Surgical History:  Procedure Laterality Date   APPENDECTOMY  1984   CATARACT EXTRACTION W/ INTRAOCULAR LENS  IMPLANT, BILATERAL  2014   CESAREAN SECTION  1984, 1987, 1994   X3   COLONOSCOPY  11/16/2013   w/B[place]ie    FOOT SURGERY Left 2002   KNEE SURGERY Left 1998   ROTATOR CUFF REPAIR Right 2007   TUBAL LIGATION  1994     OB History   No obstetric history on file.     Family History  Problem Relation Age of Onset   Dementia Father    Anxiety disorder Sister    Colon cancer Neg Hx    Colon polyps Neg Hx    Esophageal cancer Neg Hx    Stomach cancer Neg Hx    Rectal cancer Neg Hx     Social History   Tobacco Use   Smoking status: Former    Packs/day: 1.00    Years: 7.00    Pack years: 7.00  Types: Cigarettes    Quit date: 11/04/1977    Years since quitting: 43.8   Smokeless tobacco: Never  Substance Use Topics   Alcohol use: Yes    Alcohol/week: 4.0 standard drinks    Types: 4 Glasses of wine per week   Drug use: No    Home Medications Prior to Admission medications   Medication Sig Start Date End Date Taking? Authorizing Provider  HYDROcodone-acetaminophen (NORCO/VICODIN) 5-325 MG tablet Take 1 tablet by mouth every 6 (six) hours as needed. 09/01/21  Yes Ripley Fraise, MD  albuterol (VENTOLIN HFA) 108 (90 Base) MCG/ACT inhaler Inhale 2 puffs into the lungs every 4 (four) hours as needed for wheezing or shortness of breath. 07/17/17   Liane Comber, NP  Ascorbic Acid (VITAMIN C PO) Take 1 tablet by mouth.    [provider]  B Complex Vitamins (VITAMIN B-COMPLEX PO) Take 1 tablet by mouth daily.    [provider]  buPROPion (WELLBUTRIN XL) 300 MG  24 hr tablet Take 300 mg by mouth every morning. 12/17/20   [provider]  Calcium Carbonate (CALCIUM 600 PO) Take by mouth daily.    [provider]  celecoxib (CELEBREX) 100 MG capsule Take 1 capsule (100 mg total) by mouth 2 (two) times daily. 08/28/21   Trula Slade, DPM  cephALEXin (KEFLEX) 500 MG capsule Take 1 capsule (500 mg total) by mouth 3 (three) times daily. Patient not taking: Reported on 08/30/2021 06/27/21   Trula Slade, DPM  cyclobenzaprine (FLEXERIL) 10 MG tablet Take 1 tablet (10 mg total) by mouth 3 (three) times daily as needed for muscle spasms. Patient not taking: Reported on 08/30/2021 03/07/19   Street, [place], PA-C  Dexamethasone 1.5 MG (21) TBPK See admin instructions. 03/28/20   [provider]  EPINEPHrine 0.3 mg/0.3 mL IJ SOAJ injection See admin instructions. 02/08/20   [provider]  erythromycin ophthalmic ointment Place a 1/2 inch ribbon of ointment into the lower eyelid every 6 hours during the day Patient not taking: Reported on 08/30/2021 01/31/21   Kinnie Feil, PA-C  fexofenadine (ALLEGRA) 180 MG tablet 1 tablet    [provider]  FLUoxetine (PROZAC) 20 MG capsule Take 1 capsule (20 mg total) by mouth daily. For depression 05/08/15   Lindell Spar I, NP  fluticasone (FLONASE) 50 MCG/ACT nasal spray SHAKE LQ AND U 1 TO 2 SPRAYS IEN QD 10/17/17   [provider]  gabapentin (NEURONTIN) 100 MG capsule TAKE 1 CAPSULE(100 MG) BY MOUTH AT BEDTIME 07/16/21   Trula Slade, DPM  levocetirizine (XYZAL) 5 MG tablet TK 1 T PO QD IN THE EVE 12/07/17   [provider]  lisinopril (ZESTRIL) 20 MG tablet TAKE 1 TABLET BY MOUTH DAILY FOR BLOOD PRESSURE 08/22/21   Magda Bernheim, NP  Magnesium 400 MG TABS Take 1 tablet by mouth daily.    [provider]  methylphenidate (RITALIN) 10 MG tablet TK 1 T PO BID 01/16/18   [provider]  montelukast (SINGULAIR) 10 MG tablet Take 10 mg  by mouth daily. Patient not taking: Reported on 08/30/2021 12/23/20   [provider]  montelukast (SINGULAIR) 10 MG tablet 1 tablet    [provider]  mupirocin ointment (BACTROBAN) 2 % Apply 1 application topically 2 (two) times daily. Patient not taking: Reported on 08/30/2021 03/06/21   Trula Slade, DPM  ondansetron (ZOFRAN) 4 MG tablet Take 1 tablet (4 mg total) by mouth every 8 (eight)  hours as needed for nausea or vomiting. Patient not taking: Reported on 08/30/2021 06/27/21   Trula Slade, DPM  rosuvastatin (CRESTOR) 40 MG tablet TAKE 1 TABLET BY MOUTH DAILY FOR CHOLESTEROL 07/24/21   Liane Comber, NP  SYMBICORT 160-4.5 MCG/ACT inhaler INL 2 PFS PO BID 01/16/18   [provider]  topiramate (TOPAMAX) 50 MG tablet Take 1 tablet 2 x  /day at Suppertime & Bedtime for Dieting & Weight Loss 02/21/21   Unk Pinto, MD  VITAMIN D PO Take 15,000 Units by mouth daily.    [provider]    Allergies    Molds & smuts, Biaxin [clarithromycin], Serevent [salmeterol], and Tequin [gatifloxacin]  Review of Systems   Review of Systems  Constitutional:  Negative for fever.  Gastrointestinal:  Negative for abdominal pain.  Musculoskeletal:  Positive for arthralgias. Negative for neck pain.  Neurological:  Positive for headaches.  All other systems reviewed and are negative.  Physical Exam Updated Vital Signs BP 129/63   Pulse 88   Temp 98 F (36.7 C) (Oral)   Resp 17   Ht 1.6 m (5\' 3" )   Wt 95.3 kg   LMP 11/26/2011   SpO2 98%   BMI 37.20 kg/m   Physical Exam CONSTITUTIONAL: Well developed/well nourished HEAD: Normocephalic/atraumatic EYES: EOMI/PERRL ENMT: Mucous membranes moist NECK: supple no meningeal signs SPINE/BACK:entire spine nontender CV: S1/S2 noted, no murmurs/rubs/gallops noted LUNGS: Lungs are clear to auscultation bilaterally, no apparent distress ABDOMEN: soft, nontender, no rebound or guarding, bowel sounds noted  throughout abdomen GU:no cva tenderness NEURO: Pt is awake/alert/appropriate, moves all extremitiesx4.  No facial droop.   EXTREMITIES: Distal pulses equal and intact.  Full range of motion right lower extremity without any tenderness noted Left lower extremity-full range of motion of left hip without difficulty.  There is no left knee tenderness. There is diffuse tenderness and swelling noted to the left ankle.  Patient is keeping the ankle in slight inversion.  Distal pulses are intact No bruising. SKIN: warm, color normal PSYCH: Anxious  ED Results / Procedures / Treatments   Labs (all labs ordered are listed, but only abnormal results are displayed) Labs Reviewed - No data to display  EKG None  Radiology DG Ankle Complete Left  Result Date: 08/31/2021 CLINICAL DATA:  Left ankle injury.  Postop 6 weeks. EXAM: LEFT ANKLE COMPLETE - 3+ VIEW COMPARISON:  Ankle radiograph 05/29/2021 FINDINGS: No fracture or dislocation. Generalized osteopenia which is likely due to disuse. Chronic fragmentation of the dorsal navicular with spurring. Chronic fragmentation of the Achilles tendon and plantar calcaneus. No ankle joint effusion. There is mild lateral soft tissue edema. IMPRESSION: Mild lateral soft tissue edema. No acute or healing fracture. Chronic changes are stable from prior radiograph. Electronically Signed   By: Keith Rake M.D.   On: 08/31/2021 21:34    Procedures Procedures   Medications Ordered in ED Medications  ketorolac (TORADOL) 30 MG/ML injection 15 mg (15 mg Intravenous Given 09/01/21 0357)  fentaNYL (SUBLIMAZE) injection 100 mcg (100 mcg Intravenous Given 09/01/21 0357)  oxyCODONE-acetaminophen (PERCOCET/ROXICET) 5-325 MG per tablet 2 tablet (2 tablets Oral Given 09/01/21 0517)  ondansetron (ZOFRAN) injection 4 mg (4 mg Intravenous Given 09/01/21 0517)  ketorolac (TORADOL) 30 MG/ML injection 15 mg (15 mg Intravenous Given 09/01/21 6301)    ED Course  I have  reviewed the triage vital signs and the nursing notes.  Pertinent  imaging results that were available during my care of the patient were reviewed by  me and considered in my medical decision making (see chart for details).    MDM Rules/Calculators/A&P                           Patient presents after mechanical fall in the left ankle and hearing a pop.  There is no acute bony fracture or dislocation.  However patient did have recent left peroneal tendon repair/anastomosis by podiatry.  It is possible she has reinjured this tendon.  We will treat pain & reassess 6:48 AM Patient has required several doses of pain medication.  Overall she does appear improved.  She reports not having much pain medicine at home, will start her on hydrocodone Patient is neurovascularly intact.  There is no deformities or fracture noted.  I am concerned that she may have injured her recent tendon repair Reports she does have a boot at home but is fearful to use it due to pain.  She also need to continue her home knee scooter. She will need to call her podiatrist in 2 days Final Clinical Impression(s) / ED Diagnoses Final diagnoses:  Injury of left ankle, initial encounter    Rx / DC Orders ED Discharge Orders          Ordered    HYDROcodone-acetaminophen (NORCO/VICODIN) 5-325 MG tablet  Every 6 hours PRN        09/01/21 0614             Ripley Fraise, MD 09/01/21 [phone]

## 2021-09-02 ENCOUNTER — Encounter: Payer: Self-pay | Admitting: Podiatry

## 2021-09-03 ENCOUNTER — Other Ambulatory Visit: Payer: Self-pay

## 2021-09-03 ENCOUNTER — Telehealth: Payer: Self-pay | Admitting: Podiatry

## 2021-09-03 ENCOUNTER — Ambulatory Visit (INDEPENDENT_AMBULATORY_CARE_PROVIDER_SITE_OTHER): Payer: 59 | Admitting: Podiatry

## 2021-09-03 ENCOUNTER — Encounter: Payer: Self-pay | Admitting: Podiatry

## 2021-09-03 ENCOUNTER — Ambulatory Visit: Payer: 59 | Admitting: Physical Therapy

## 2021-09-03 DIAGNOSIS — S86012A Strain of left Achilles tendon, initial encounter: Secondary | ICD-10-CM

## 2021-09-03 DIAGNOSIS — S96919D Strain of unspecified muscle and tendon at ankle and foot level, unspecified foot, subsequent encounter: Secondary | ICD-10-CM | POA: Diagnosis not present

## 2021-09-03 MED ORDER — OXYCODONE-ACETAMINOPHEN 5-325 MG PO TABS: 1.0000 | ORAL_TABLET | Freq: Four times a day (QID) | ORAL | 0 refills | Status: DC | PRN

## 2021-09-03 NOTE — Telephone Encounter (Signed)
Please advise 

## 2021-09-03 NOTE — Telephone Encounter (Signed)
Pt called office to request oxycodone as discussed at today's appt.  Thanks

## 2021-09-04 ENCOUNTER — Encounter: Payer: Self-pay | Admitting: Podiatry

## 2021-09-05 ENCOUNTER — Ambulatory Visit: Payer: 59 | Admitting: Physical Therapy

## 2021-09-05 NOTE — Progress Notes (Signed)
Subjective: Haley White is a 65 y.o. is seen today in office s/p left peroneal tendon repair, anastomosis.  Over the weekend she states that she was not wearing her boot on a scooter and she tripped on a rug and she slammed her left foot on the ground and since then she has had uncontrollable pain.  She had to call 911 and was seen in the emergency room.  Recommended to wear the boot and she was prescribed hydrocodone.  She states that she is still having pain and is not helping.  Points on the Achilles tendon she has majority discomfort.  She said that when she fell she did hear a pop.  Denies any fevers or chills.  No other concerns.  Objective: General: No acute distress, AAOx3  DP/PT pulses palpable 2/4, CRT < 3 sec to all digits.  Protective sensation intact. Motor function intact.  Left foot:  Incision is well coapted without any evidence of dehiscence the wound is healing.  There is edema present on the peroneal tendon but also along the Achilles.  There is tenderness palpation of the course of the Achilles tendon.  I am unable to elicit a palpable deficit however quite a bit of tenderness in this difficult to fully evaluate.  Also tenderness in the course the peroneal tendon.  No area of pinpoint tenderness. No pain with calf compression, swelling, warmth, erythema.   Assessment and Plan:  Status post left peroneal tendon anastomosis; concern for Achilles tear  -Treatment options discussed including all alternatives, risks, and complications -X-rays from the ER evaluated.  There is no evidence of fracture.  At this point given her symptoms I recommend an MRI to further evaluate the tendons to rule out any rupture.  Continue nonweightbearing for now.  Prescribed oxycodone.  She can continue gabapentin as well.  Ice elevation.  Unna boot was applied today precautions were advised on when to remove this.  Trula Slade DPM

## 2021-09-06 ENCOUNTER — Telehealth: Payer: Self-pay | Admitting: *Deleted

## 2021-09-06 NOTE — Telephone Encounter (Signed)
-----   Message from Trula Slade, DPM sent at 09/04/2021  5:37 PM EDT ----- Can you please follow up on MRI left ankle? It is urgent. Thanks.

## 2021-09-06 NOTE — Telephone Encounter (Signed)
Patient has an appointment on 09/08/2021. Haley White

## 2021-09-07 ENCOUNTER — Encounter: Payer: Self-pay | Admitting: Podiatry

## 2021-09-07 ENCOUNTER — Other Ambulatory Visit: Payer: Self-pay | Admitting: Podiatry

## 2021-09-07 MED ORDER — OXYCODONE-ACETAMINOPHEN 5-325 MG PO TABS: 1.0000 | ORAL_TABLET | Freq: Four times a day (QID) | ORAL | 0 refills | Status: DC | PRN

## 2021-09-08 ENCOUNTER — Ambulatory Visit
Admission: RE | Admit: 2021-09-08 | Discharge: 2021-09-08 | Disposition: A | Discharge status: Home / Self Care | Payer: 59 | Source: Ambulatory Visit | Attending: Podiatry | Admitting: Podiatry

## 2021-09-08 DIAGNOSIS — S86012A Strain of left Achilles tendon, initial encounter: Secondary | ICD-10-CM

## 2021-09-08 IMAGING — MR MR ANKLE*L* W/O CM
5 series · 39 of 40 positions shown · non-contrast
Comparison: Radiographs [DATE].  MRI [DATE].

CLINICAL DATA: Posttraumatic foot pain following recent peroneal
tendon repair. Evaluate for Achilles tendon rupture.

EXAM:
MRI OF THE LEFT ANKLE WITHOUT CONTRAST
TECHNIQUE: Multiplanar, multisequence MR imaging of the ankle was performed. No
intravenous contrast was administered.

[Series 4: T2 fat-sat · axial · 3.0mm · 0.53mm/px · z∈[-100,+52]mm · 9 of 40 slices shown (1 of 2)]
[im 1/40]
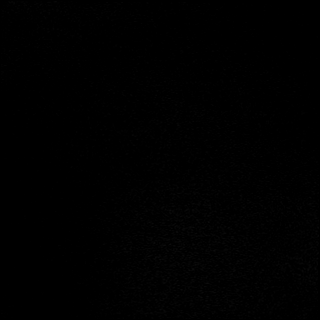
[im 5/40]
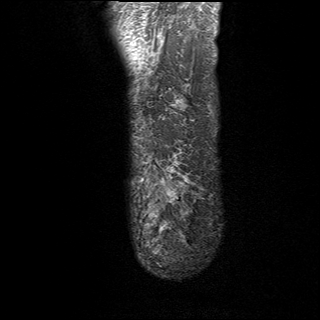
[im 10/40]
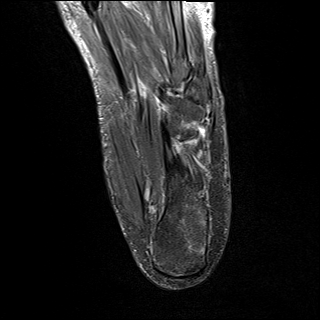
[im 15/40]
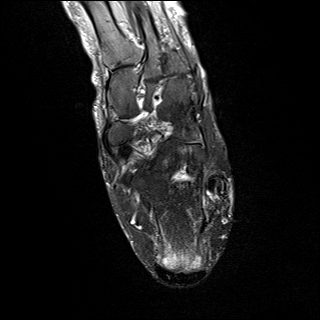
[im 20/40]
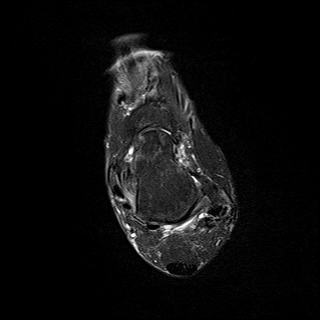
[im 25/40]
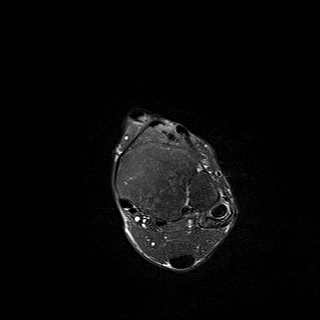
[im 30/40]
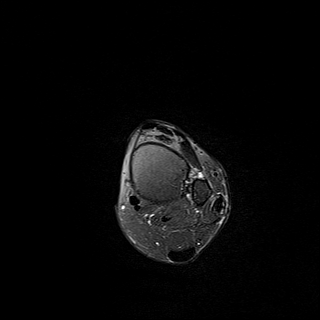
[im 35/40]
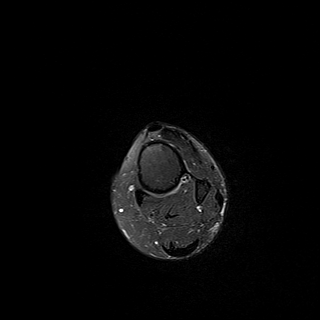
[im 40/40]
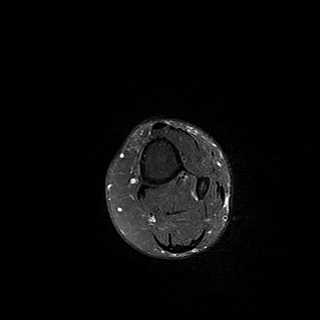

[Series 5: T1 · axial · 3.0mm · 0.53mm/px · z∈[-100,+52]mm · 9 of 40 slices shown (1 of 2)]
[im 1/40]
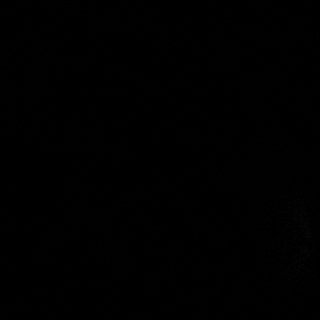
[im 5/40]
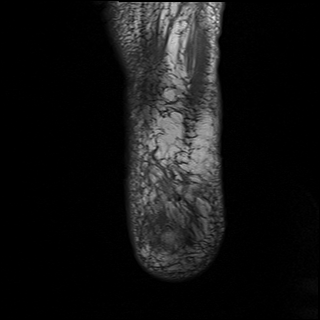
[im 10/40]
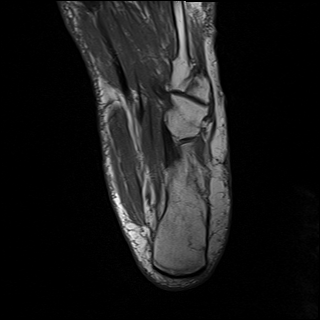
[im 15/40]
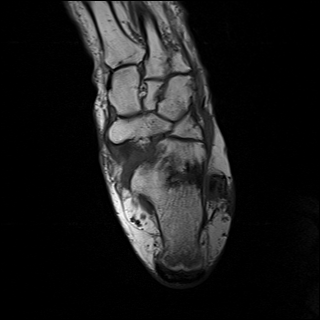
[im 20/40]
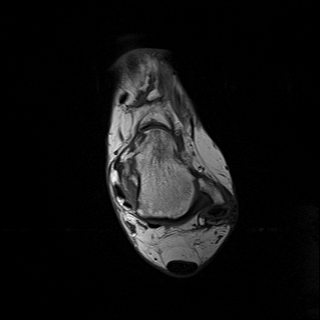
[im 25/40]
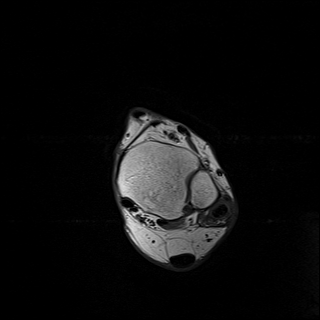
[im 30/40]
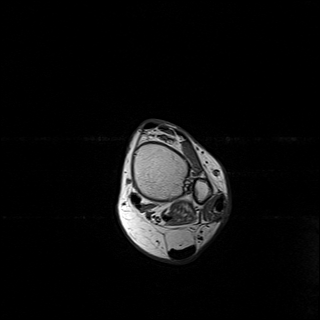
[im 35/40]
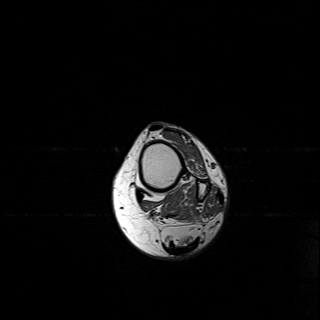
[im 40/40]
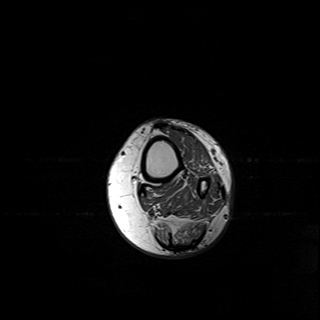

[Series 6: T1 · sagittal · 4.0mm · 0.53mm/px · 6 of 26 slices shown (2 of 2)]
[im 1/26]
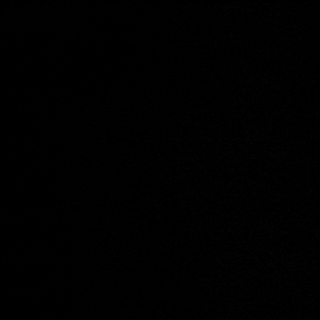
[im 6/26]
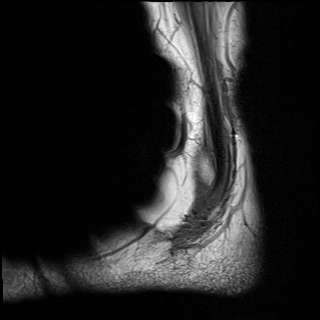
[im 11/26]
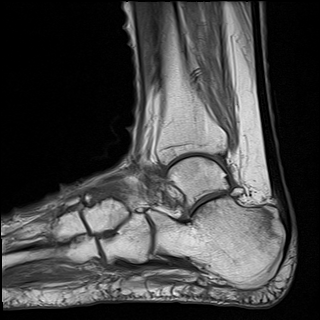
[im 16/26]
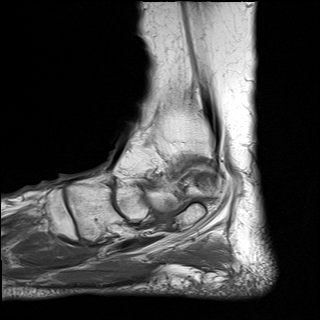
[im 21/26]
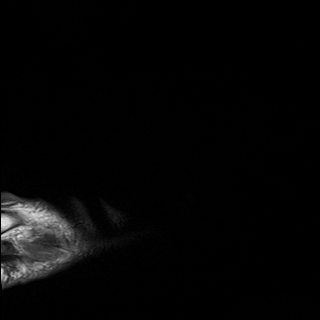
[im 26/26]
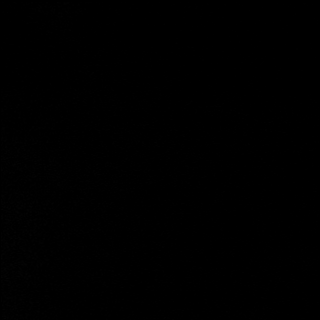

[Series 7: STIR · sagittal · 4.0mm · 0.33mm/px · 6 of 26 slices shown]
[im 1/26]
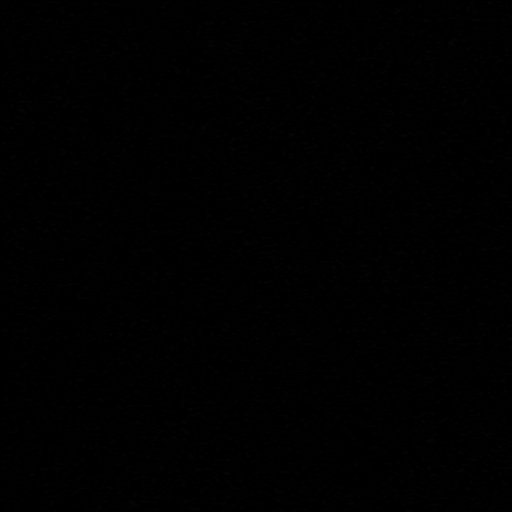
[im 6/26]
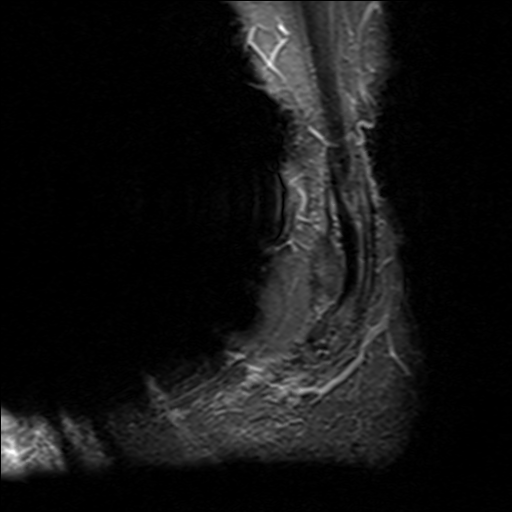
[im 11/26]
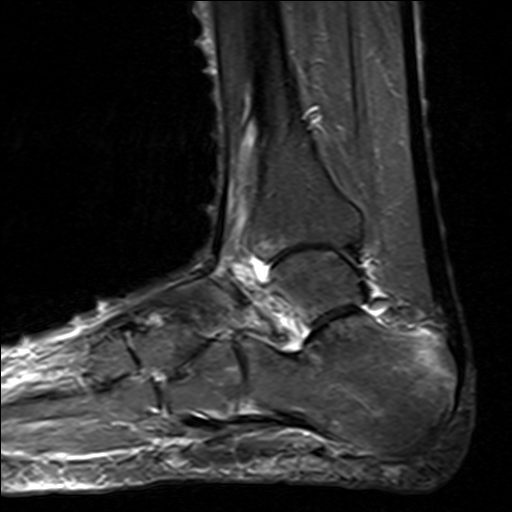
[im 16/26]
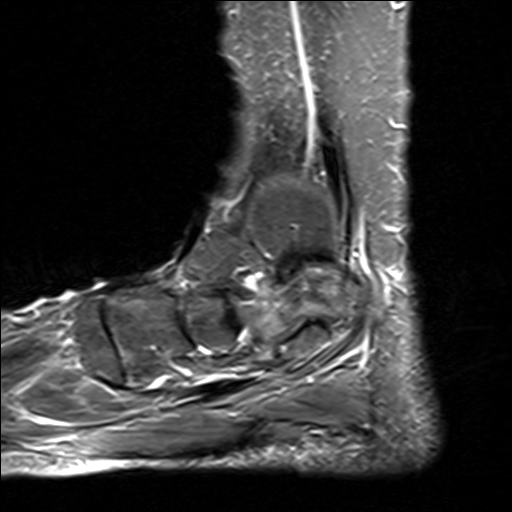
[im 21/26]
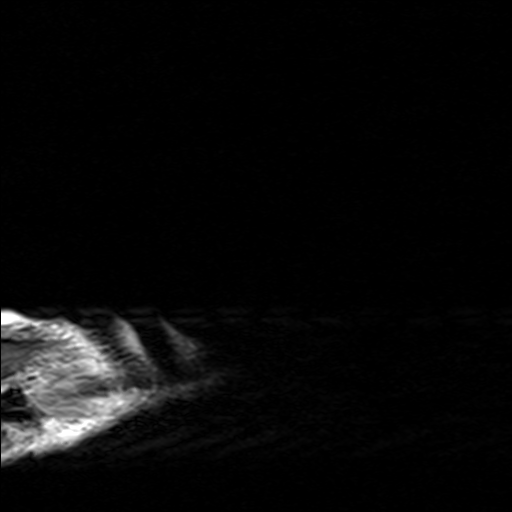
[im 26/26]
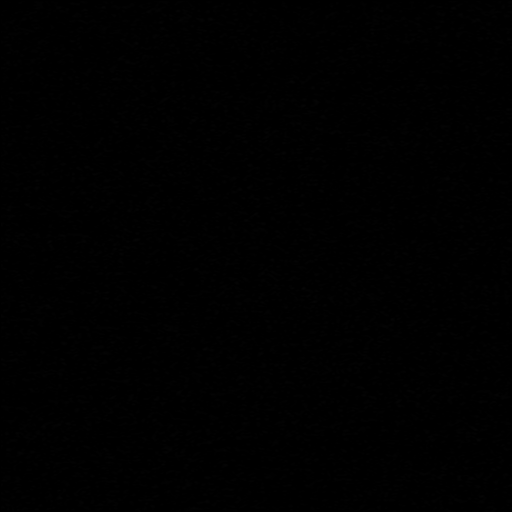

[Series 8: T2 fat-sat · coronal · 3.0mm · 0.62mm/px · 9 of 42 slices shown (2 of 2)]
[im 1/42]
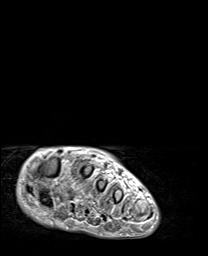
[im 5/42]
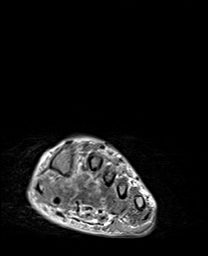
[im 10/42]
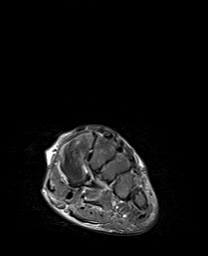
[im 14/42]
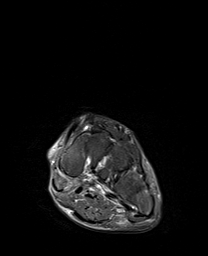
[im 19/42]
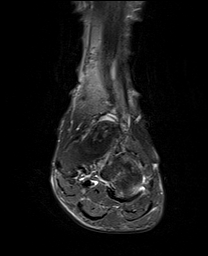
[im 23/42]
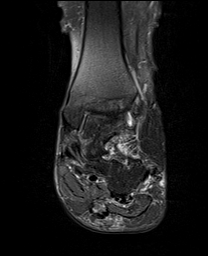
[im 28/42]
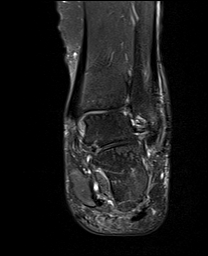
[im 37/42]
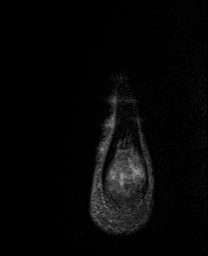
[im 42/42]
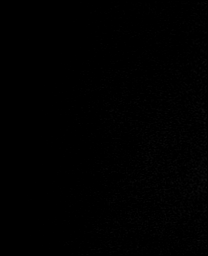

[39 of 40 positions shown; findings below may reference images not displayed]

FINDINGS: TENDONS

Peroneal: Interval repair of the previously demonstrated
longitudinal split tear of the peroneus brevis tendon. The peroneal
tendons appear intact and normally located. There is a small amount
of peroneal tendon sheath fluid. There is associated surrounding
postsurgical susceptibility artifact.

Posteromedial: Intact and normally positioned.

Anterior: Intact and normally positioned.

Achilles: Stable mild chronic Achilles tendinosis with small
interstitial tears distally. No evidence of Achilles tendon rupture

Plantar Fascia: Stable chronic thickening of the central band of the
plantar fascia with associated fragmented enthesophytes.

LIGAMENTS

Lateral: The anterior and posterior talofibular and calcaneofibular
ligaments are intact.The inferior tibiofibular ligaments appear
intact.

Medial: The deltoid and visualized portions of the spring ligament
appear degenerative but intact.

CARTILAGE AND BONES

Ankle Joint: Stable small subchondral cysts laterally in the talar
dome. Small ankle joint effusion.

Subtalar Joints/Sinus Tarsi: The subtalar joint and tarsal sinus
appear unremarkable.

Bones: New linear bone marrow edema superiorly in the calcaneal
tuberosity consistent with an insufficiency fracture. This location
is associated with underlying diabetes. No displaced avulsion
fractures identified. Stable midfoot degenerative changes.

Other: No significant soft tissue findings.
IMPRESSION: 1. Insufficiency fracture of the calcaneal tuberosity superiorly,
near the Achilles insertion, as can be seen associated with
diabetes. No displaced fracture or Achilles tendon tear identified.
2. Postsurgical changes related to repair of the previously
demonstrated longitudinal split tear of the peroneus brevis tendon.
No new tendon findings.
3. Stable subchondral cysts in the lateral talar dome and midfoot
degenerative changes. No other acute osseous findings.

## 2021-09-10 ENCOUNTER — Ambulatory Visit: Payer: 59

## 2021-09-13 ENCOUNTER — Telehealth: Payer: Self-pay | Admitting: *Deleted

## 2021-09-13 ENCOUNTER — Other Ambulatory Visit: Payer: Self-pay

## 2021-09-13 ENCOUNTER — Ambulatory Visit (INDEPENDENT_AMBULATORY_CARE_PROVIDER_SITE_OTHER): Payer: 59 | Admitting: Podiatry

## 2021-09-13 DIAGNOSIS — S92002A Unspecified fracture of left calcaneus, initial encounter for closed fracture: Secondary | ICD-10-CM

## 2021-09-13 MED ORDER — OXYCODONE-ACETAMINOPHEN 5-325 MG PO TABS: 1.0000 | ORAL_TABLET | Freq: Four times a day (QID) | ORAL | 0 refills | Status: DC | PRN

## 2021-09-13 NOTE — Telephone Encounter (Signed)
Patient is calling to request a pain medicine refill of the oxyCodone-ace,5-325mg . Please advise.

## 2021-09-13 NOTE — Patient Instructions (Signed)
Cast or Splint Care, Adult Casts and splints are supports that are worn to protect broken bones and other injuries. A cast or splint may hold a bone still and in the correct position while it heals. Casts and splints may also help with pain, swelling, and muscle spasms. A cast is a hardened support that is usually made of fiberglass or plaster. It is custom-fit to the body and offers more protection than a splint. Most casts cannot be taken off and put back on. A splint is a type of soft support that is usually made from cloth and elastic. It can be adjusted or taken off as needed. Often, when a bone is broken, a splint is put on until the swelling goes down, and then the splint is replaced by a cast. You may need a cast or a splint if you: Have a broken bone. Have a soft-tissue injury. Need to keep an injured body part from moving (keep it immobile) after surgery. What are the risks? In some cases, wearing a cast or splint can cause a reduced blood supply to the wrist or hand or to the foot and toes. This can happen if there is a lot of swelling or if the cast or splint is too tight. Limited blood supply results in a condition called compartment syndrome and can cause permanent damage. Symptoms include: Pain that is getting worse. Numbness and tingling. Changes in skin color, including paleness or a bluish color. Cold fingers or toes. Other complications of wearing a cast or splint can include: Skin irritation that can cause itching, rash, skin sores, or skin infection. Limb stiffness or weakness. How to care for a nonremovable cast or splint  Do not put pressure on any part of the cast or splint until it is fully hardened. This may take several hours. Check the skin around the cast or splint every day. Tell your health care provider about any concerns. Do not stick anything inside the cast or splint to scratch your skin. Doing that increases your risk of infection. You may put lotion on dry  skin around the edges of the cast or splint. Do not put lotion on the skin underneath the cast or splint. Keep the cast or splint clean and dry. How to care for a removable splint Wear the splint as told by your health care provider. Remove it only as told by your health care provider. Check the skin around the splint every day. Tell your health care provider about any concerns. Loosen the splint if your fingers tingle, become numb, or turn cold and blue. Keep the splint clean and dry. Clean your splint as told by your health care provider. Use mild soap and water and let it air-dry. Do not use heat on your splint. Follow these instructions at home: Bathing Do not take baths, swim, or use a hot tub until your health care provider approves. Ask your health care provider if you may take showers. You may only be allowed to take sponge baths. If your cast or splint is not waterproof: Do not let it get wet. Cover it with a watertight covering when you take a bath or shower. Managing pain, stiffness, and swelling  If directed, put ice on the affected area. To do this: If you have a removable cast or splint, remove it as told by your health care provider. Put ice in a plastic bag. Place a towel between your skin and the bag or between your cast and the bag.  Leave the ice on for 20 minutes, 2-3 times a day. Remove the ice if your skin turns bright red. This is very important. If you cannot feel pain, heat, or cold, you have a greater risk of damage to the area. Move your fingers or toes often to reduce stiffness and swelling. Raise (elevate) the injured area above the level of your heart while you are sitting or lying down. Safety Do not use the injured limb to support your body weight until your health care provider says that you can. Use crutches or other assistive devices as told by your health care provider. Ask your health care provider when it is safe to drive if you have a cast or splint on  part of your body. General instructions Take over-the-counter and prescription medicines only as told by your health care provider. Return to your normal activities as told by your health care provider. Ask your health care provider what activities are safe for you. Keep all follow-up visits. This is important. Contact a health care provider if: The skin around the cast or splint gets red or raw. The skin under the cast is extremely itchy or painful. Your cast or splint: Gets damaged. Feels very uncomfortable. Is too tight or too loose. Your cast becomes wet or develops a soft spot or area. You notice a bad smell coming from under your cast. You get an object stuck under your cast. There is fluid leaking through the cast. Get help right away if: You develop any symptoms of compartment syndrome, such as: Severe pain or pressure under the cast. Numbness, tingling, coldness, or pale or bluish skin. The part of your body above or below the cast is swollen and discolored. You cannot feel or move your fingers or toes. You have trouble breathing or shortness of breath. You have chest pain. Your pain gets worse. These symptoms may represent a serious problem that is an emergency. Do not wait to see if the symptoms will go away. Get medical help right away. Call your local emergency services (911 in the [place] Do not drive yourself to the hospital. Summary Casts and splints are worn to protect broken bones and other injuries. Casts and splints should remain clean and dry. Remove your cast or splint only as told by your health care provider. Get help right away if your pain gets worse, or if you have numbness, tingling, or skin that turns cold, blue, or discolored. This information is not intended to replace advice given to you by your health care provider. Make sure you discuss any questions you have with your health care provider. Document Revised: 04/17/2021 Document Reviewed:  04/17/2021 Elsevier Patient Education  [place].

## 2021-09-13 NOTE — Telephone Encounter (Signed)
Medication refill request.

## 2021-09-16 NOTE — Progress Notes (Signed)
Subjective: Haley White is a 65 y.o. is seen today in office s/p left peroneal tendon repair, anastomosis.  Subsequently she had a fall and she felt a pop.  She has increased pain she was actually seen emergency department for this and her uncontrolled pain.  MRI was performed which she had done I called her with results which revealed a insufficiency fracture of the calcaneus.  She has been nonweightbearing wearing the cam boot.  She presents today for further discussion as well as cast application.  No recent falls in the last saw her last.  She has no other concerns today.   Objective: General: No acute distress, AAOx3  DP/PT pulses palpable 2/4, CRT < 3 sec to all digits.  Protective sensation intact. Motor function intact.  Left foot: Incision is well coapted.  There is no signs of infection noted.  There is decreased edema.  There is tenderness palpation of the lateral aspect which seems to be of the peroneal tendon but it is coming more from the calcaneus.  Achilles tendon appears to be intact. No pain with calf compression, swelling, warmth, erythema.   MRI 09/08/2021 IMPRESSION: 1. Insufficiency fracture of the calcaneal tuberosity superiorly, near the Achilles insertion, as can be seen associated with diabetes. No displaced fracture or Achilles tendon tear identified. 2. Postsurgical changes related to repair of the previously demonstrated longitudinal split tear of the peroneus brevis tendon. No new tendon findings. 3. Stable subchondral cysts in the lateral talar dome and midfoot degenerative changes. No other acute osseous findings.  Assessment and Plan:  Status post left peroneal tendon anastomosis; calcaneal fracture  -Treatment options discussed including all alternatives, risks, and complications -Discussed the MRI findings with her as well today again.  Her daughter was present.  Placed into a well-padded below-knee fiberglass cast making sure to pad all bony prominences.   Continue nonweightbearing, ice and elevation.  Pain medication as needed.  Trula Slade DPM

## 2021-09-17 ENCOUNTER — Other Ambulatory Visit: Payer: Self-pay

## 2021-09-17 ENCOUNTER — Ambulatory Visit: Payer: 59 | Attending: Podiatry

## 2021-09-17 DIAGNOSIS — M6281 Muscle weakness (generalized): Secondary | ICD-10-CM | POA: Insufficient documentation

## 2021-09-17 DIAGNOSIS — Z9889 Other specified postprocedural states: Secondary | ICD-10-CM | POA: Insufficient documentation

## 2021-09-17 DIAGNOSIS — M79672 Pain in left foot: Secondary | ICD-10-CM

## 2021-09-17 DIAGNOSIS — M25672 Stiffness of left ankle, not elsewhere classified: Secondary | ICD-10-CM | POA: Insufficient documentation

## 2021-09-17 DIAGNOSIS — R262 Difficulty in walking, not elsewhere classified: Secondary | ICD-10-CM | POA: Diagnosis present

## 2021-09-17 NOTE — Therapy (Signed)
[place], [place], [address] Phone: [phone]   Fax:  [phone]  Physical Therapy Treatment  Patient Details  Name: Haley White MRN: [id number] Date of Birth: 1956/06/22 Referring Provider (PT): Trula Slade, [place]   Encounter Date: 09/17/2021   PT End of Session - 09/17/21 1430     Visit Number 2    Number of Visits 17    Date for PT Re-Evaluation 10/25/21    Authorization Type [employer]: 30 VL; 1 visit remaining    Authorization - Visit Number 30    Authorization - Number of Visits 30    PT Start Time 5093   patient late   PT Stop Time 1500    PT Time Calculation (min) 29 min    Equipment Utilized During Treatment Other (comment)   knee scooter; cast   Activity Tolerance Patient tolerated treatment well    Behavior During Therapy WFL for tasks assessed/performed             Past Medical History:  Diagnosis Date   Anxiety    Arthritis    Asthma    Cataract    Gout    Hyperlipidemia    Hypertension    Obese     Past Surgical History:  Procedure Laterality Date   APPENDECTOMY  1984   CATARACT EXTRACTION W/ INTRAOCULAR LENS  IMPLANT, BILATERAL  2014   [place]   X3   COLONOSCOPY  11/16/2013   w/Brodie    FOOT SURGERY Left 2002   [place] Right 2007   TUBAL LIGATION  1994    There were no vitals filed for this visit.   Subjective Assessment - 09/17/21 1431     Subjective Patient had a fall off her scooter almost 2 weeks ago that resulted in a calcaneal fracture. She had f/u with Dr. Jacqualyn Posey on 09/13/21 and was casted and is NWB until her next f/u with him on 10/02/21. She reports the pain in the foot is mild to moderate now.    Currently in Pain? Yes    Pain Score 2     Pain Location Foot    Pain Orientation Left    Pain Descriptors / Indicators Sharp;Aching    Pain Type Acute pain    Pain Onset 1 to  4 weeks ago    Pain Frequency Constant    Aggravating Factors  movement    Pain Relieving Factors rest, elevation                [facility] PT Assessment - 09/17/21 0001       Restrictions   LLE Weight Bearing Non weight bearing                [facility] Adult PT Treatment/Exercise:  Therapeutic Exercise: - Updated HEP with patient completing 5 reps bilaterally of each: - clamshells blue band - SLR - Sidelying hip abduction - LAQ - Prone hip extension    Manual Therapy: - n/a  Neuromuscular re-ed: - n/a  Therapeutic Activity: - n/a  Self-care/Home Management: - n/a  Gait training:  - Gait training with RW while maintaining NWB on the LLE 2 x 20 ft.  - Educated on appropriate RW fit - Educated on stair/curb negotiation, though no time to demo/practice in clinic today.  PT Short Term Goals - 08/30/21 1419       PT SHORT TERM GOAL #1   Title Pt will report understanding and adherence to her HEP in order to promote independence in the management of her primary impairments.    Baseline issued at eval.    Time 4    Period Weeks    Status New    Target Date 09/27/21      PT SHORT TERM GOAL #2   Title Pt will demonstrate ability to tolerate lateral weightshifting in standing in order to transition to walking with less limitation.    Baseline Pt is unable to bear weight through L ankle due to pain.    Time 4    Period Weeks    Status New               PT Long Term Goals - 08/30/21 1421       PT LONG TERM GOAL #1   Title Pt will achieve a FOTO score of 55% or higher in order to demonstrate improved functional ability as it relates to her L ankle pain.    Baseline 43%    Time 8    Period Weeks    Status New    Target Date 10/25/21      PT LONG TERM GOAL #2   Title Pt will demonstrate the ability to perform a 6-minute walk test without CAM boot or AD at self-selected pace in order to progress to safe  community ambulation.    Baseline Pt unable to walk without CAM boot and axillary crutches.    Time 8    Period Weeks    Status New    Target Date 10/25/21      PT LONG TERM GOAL #3   Title Pt will demonstrate L ankle eversion and inversion of 20 degrees with 0-2/10 pain in order to put on her socks and shoes without limitation.    Baseline See flowsheet    Time 8    Period Weeks    Status New    Target Date 10/25/21      PT LONG TERM GOAL #4   Title Pt will demonstrate BIL global hip strength of 5/5 in order to independently progress her LE strengthening regimen without limitation.    Baseline See flowsheet8    Period Weeks    Status New    Target Date 10/25/21                   Plan - 09/17/21 1503     Clinical Impression Statement Session was limited as patient was late for her scheduled visit. Since her initial evaluation patient sustained a fall form her knee scooter that resulted in an insufficiency fracture of the calcaneus and she is now casted and NWB about the LLE. Per Dr. Jacqualyn Posey he has recommended to continue with PT to create a home program targeted at hip/knee strengthening that she can continue independently while NWB status is in place. Today's session focused on issuing an HEP to include hip and knee strengthening in NWB as well as gait training with a RW as patient is currently only ambulating with her knee scooter and is interested in other means of ambulation that allow for more independence as she is now having to rely upon others to lift/carry her scooter up/down stairs and put in the car. She tolerated ther ex well requiring consistent cues to maintain appropriate hip positioning with majority of exercises. She  demonstrates excellent balance with short distances walking in clinic with RW while abiding by weight-bearing restrictions, though requires further training on stair/curb negotation at next session to improve her overall safety with ambulation. At next  session will plan to complete further gait training with RW focusing on stair/curb negotiation and review HEP that was prescribed today.    Personal Factors and Comorbidities Comorbidity 3+;Past/Current Experience    Comorbidities OA, HTN, anxety, multiple ankle injuries/ surgeries    Examination-Activity Limitations Carry;Lift;Locomotion Level;Stairs;Stand;Sit;Transfers;Squat    Examination-Participation Restrictions Church;Cleaning;Community Activity;Laundry;Meal Prep;Shop    Stability/Clinical Decision Making --    Rehab Potential --    PT Frequency --    PT Duration --    PT Treatment/Interventions ADLs/Self Care Home Management;Cryotherapy;Software engineer;Therapeutic activities;Therapeutic exercise;Balance training;Neuromuscular re-education;Patient/family education;Manual techniques;Passive range of motion;Dry needling;Taping;Vasopneumatic Device;Stair training;Aquatic Therapy;Iontophoresis 4mg /ml Dexamethasone    PT Next Visit Plan gait training, review HEP    PT Home Exercise Plan Access Code [place] and Agree with Plan of Care Patient             Patient will benefit from skilled therapeutic intervention in order to improve the following deficits and impairments:  Abnormal gait, Decreased range of motion, Difficulty walking, Decreased endurance, Decreased activity tolerance, Pain, Decreased balance, Impaired flexibility, Decreased strength, Increased edema  Visit Diagnosis: Pain in left foot  Difficulty in walking, not elsewhere classified  Stiffness of left ankle, not elsewhere classified  Muscle weakness (generalized)  Status post left foot surgery     Problem List Patient Active Problem List   Diagnosis Date Noted   Allergic rhinitis 03/15/2021   Allergic rhinitis due to animal (cat) (dog) hair and dander 03/15/2021   Allergic rhinitis due to pollen 03/15/2021   Gastro-esophageal reflux disease without esophagitis  03/15/2021   Mild persistent asthma, uncomplicated 26/71/2458   Aortic atherosclerosis ([place]) by Chest CT scan on 02/02/2018  01/10/2021   Asthma    Low back pain 03/17/2019   Lumbosacral spondylosis without myelopathy 03/17/2019   Cubital tunnel syndrome 10/14/2018   Closed fracture of distal end of right radius 07/09/2018   Pseudogout 12/09/2017   Major depressive disorder, recurrent, severe without psychotic features ([place])    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   Suicide attempt by drug ingestion ([place])    Medication management 08/22/2014   Vitamin D deficiency 08/22/2014   Hyperlipidemia, mixed 08/22/2014   Hypertension    Anxiety    Abnormal glucose    Class 2 severe obesity due to excess calories with serious comorbidity and body mass index (BMI) of 37.0 to 37.9 in adult [facility])    Gwendolyn Grant, PT, DPT, ATC 09/17/21 3:27 PM   [facility] [address] [place], [place], [address] Phone: [phone]   Fax:  [phone]  Name: Haley White MRN: [id number] Date of Birth: [birth year]

## 2021-09-18 ENCOUNTER — Encounter: Payer: 59 | Admitting: Podiatry

## 2021-09-19 ENCOUNTER — Other Ambulatory Visit: Payer: Self-pay

## 2021-09-19 ENCOUNTER — Ambulatory Visit: Payer: 59

## 2021-09-19 DIAGNOSIS — M79672 Pain in left foot: Secondary | ICD-10-CM

## 2021-09-19 DIAGNOSIS — M25672 Stiffness of left ankle, not elsewhere classified: Secondary | ICD-10-CM

## 2021-09-19 DIAGNOSIS — R262 Difficulty in walking, not elsewhere classified: Secondary | ICD-10-CM

## 2021-09-19 DIAGNOSIS — M6281 Muscle weakness (generalized): Secondary | ICD-10-CM

## 2021-09-19 NOTE — Therapy (Signed)
[place], [place], [address] Phone: [phone]   Fax:  [phone]  Physical Therapy Treatment  Patient Details  Name: Haley White MRN: [id number] Date of Birth: [birth year] Referring Provider (PT): Trula Slade, [place]   Encounter Date: 09/19/2021   PT End of Session - 09/19/21 1447     Visit Number 3    Number of Visits 17    Date for PT Re-Evaluation 10/25/21    Authorization Type [employer]: 30 VL; has hit visit limit and is now self-pay    Authorization - Visit Number --    Authorization - Number of Visits --    PT Start Time 1447    PT Stop Time 1526    PT Time Calculation (min) 39 min    Equipment Utilized During Treatment Other (comment);Gait belt   knee scooter; cast; RW   Activity Tolerance Patient tolerated treatment well    Behavior During Therapy [facility] for tasks assessed/performed             Past Medical History:  Diagnosis Date   Anxiety    Arthritis    Asthma    Cataract    Gout    Hyperlipidemia    Hypertension    Obese     Past Surgical History:  Procedure Laterality Date   APPENDECTOMY  1984   CATARACT EXTRACTION W/ INTRAOCULAR LENS  IMPLANT, BILATERAL  2014   [place]   X3   COLONOSCOPY  11/16/2013   w/Brodie    FOOT SURGERY Left 2002   [place] Right 2007   TUBAL LIGATION  1994    There were no vitals filed for this visit.   Subjective Assessment - 09/19/21 1544     Subjective Patient reports the foot is hurting a little bit today, attributed to the colder weather. She was able to obtain a RW from her neighbor and believes she has it set to the right height.    Currently in Pain? Yes    Pain Score 4     Pain Location Foot    Pain Orientation Left    Pain Descriptors / Indicators Aching;Sharp    Pain Type Acute pain    Pain Onset 1 to 4 weeks ago    Pain Frequency Constant                  [facility] Adult PT Treatment/Exercise:   Therapeutic Exercise: - N/A     Manual Therapy: - N/A   Neuromuscular re-ed: - n/a   Therapeutic Activity: - floor transfer with utilizing RW and maintaining NWB of the LLE. Demonstration and trialing half kneel transfer position and demonstration of utilizing scooting into chair to complete transfer.    Self-care/Home Management: - n/a   Gait training:  - Curb negotiation demonstrating and trialing with RW maintaining NWB of the LLE; multiple trials of ascent, though unable to complete; able to complete descent.  - Stair negotiation demonstrating and trialing at various step height while maintaining LLE NWB utilizing buttocks scoot for both ascent/descent.                                 PT Short Term Goals - 08/30/21 1419       PT SHORT TERM GOAL #1   Title Pt will report understanding and adherence  to her HEP in order to promote independence in the management of her primary impairments.    Baseline issued at eval.    Time 4    Period Weeks    Status New    Target Date 09/27/21      PT SHORT TERM GOAL #2   Title Pt will demonstrate ability to tolerate lateral weightshifting in standing in order to transition to walking with less limitation.    Baseline Pt is unable to bear weight through L ankle due to pain.    Time 4    Period Weeks    Status New               PT Long Term Goals - 08/30/21 1421       PT LONG TERM GOAL #1   Title Pt will achieve a FOTO score of 55% or higher in order to demonstrate improved functional ability as it relates to her L ankle pain.    Baseline 43%    Time 8    Period Weeks    Status New    Target Date 10/25/21      PT LONG TERM GOAL #2   Title Pt will demonstrate the ability to perform a 6-minute walk test without CAM boot or AD at self-selected pace in order to progress to safe community ambulation.    Baseline Pt unable to walk without CAM boot  and axillary crutches.    Time 8    Period Weeks    Status New    Target Date 10/25/21      PT LONG TERM GOAL #3   Title Pt will demonstrate L ankle eversion and inversion of 20 degrees with 0-2/10 pain in order to put on her socks and shoes without limitation.    Baseline See flowsheet    Time 8    Period Weeks    Status New    Target Date 10/25/21      PT LONG TERM GOAL #4   Title Pt will demonstrate BIL global hip strength of 5/5 in order to independently progress her LE strengthening regimen without limitation.    Baseline See flowsheet8    Period Weeks    Status New    Target Date 10/25/21                   Plan - 09/19/21 1538     Clinical Impression Statement Session focused on further gait training and transfer training while maintaining LLE NWB status. Haley White was unable to complete ascent of curb utilizing RW even with assistance and continued practice, so at this time was recommended that she seek out a ramp/incline when in the community if she is utilizing her RW for ambulation. With stair negotation she is able to perform buttock scoot independently and able to perform floor to stand transfer utilizing RW for support while maintaining WB precautions. She had no questions regarding her HEP that was issued at last session and verbalized understanding of all gait and transfer training today. PT will be on hold until her next f/u with Dr. Jacqualyn Posey at the end of the month, though as encouraged to reach out via MyChart if she had any questions in the meantime.    Personal Factors and Comorbidities Comorbidity 3+;Past/Current Experience    Comorbidities OA, HTN, anxety, multiple ankle injuries/ surgeries    Examination-Activity Limitations Carry;Lift;Locomotion Level;Stairs;Stand;Sit;Transfers;Squat    Examination-Participation Restrictions Church;Cleaning;Community Activity;Laundry;Meal Prep;Shop    PT Treatment/Interventions ADLs/Self Care Home  Management;Cryotherapy;Software engineer;Therapeutic activities;Therapeutic exercise;Balance training;Neuromuscular re-education;Patient/family education;Manual techniques;Passive range of motion;Dry needling;Taping;Vasopneumatic Device;Stair training;Aquatic Therapy;Iontophoresis 4mg /ml Dexamethasone    PT Next Visit Plan PT on hold until next f/u with Dr. Jacqualyn Posey    PT Home Exercise Plan Access Code [facility]    Consulted and Agree with Plan of Care Patient             Patient will benefit from skilled therapeutic intervention in order to improve the following deficits and impairments:  Abnormal gait, Decreased range of motion, Difficulty walking, Decreased endurance, Decreased activity tolerance, Pain, Decreased balance, Impaired flexibility, Decreased strength, Increased edema  Visit Diagnosis: Pain in left foot  Difficulty in walking, not elsewhere classified  Stiffness of left ankle, not elsewhere classified  Muscle weakness (generalized)     Problem List Patient Active Problem List   Diagnosis Date Noted   Allergic rhinitis 03/15/2021   Allergic rhinitis due to animal (cat) (dog) hair and dander 03/15/2021   Allergic rhinitis due to pollen 03/15/2021   Gastro-esophageal reflux disease without esophagitis 03/15/2021   Mild persistent asthma, uncomplicated 23/55/7322   Aortic atherosclerosis ([place]) by Chest CT scan on 02/02/2018  01/10/2021   Asthma    Low back pain 03/17/2019   Lumbosacral spondylosis without myelopathy 03/17/2019   Cubital tunnel syndrome 10/14/2018   Closed fracture of distal end of right radius 07/09/2018   Pseudogout 12/09/2017   Major depressive disorder, recurrent, severe without psychotic features ([place])    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 [place], [place], [address] Phone: [phone]   Fax:  [phone]  Name: Haley White MRN: [id number] Date of Birth: 17-Apr-1956

## 2021-09-20 ENCOUNTER — Telehealth: Payer: Self-pay

## 2021-09-20 NOTE — Telephone Encounter (Signed)
Followed up with patient's MyChart message via telephone. Discussed that patient's current HEP is substantial until she has next f/u with Dr. Jacqualyn Posey giving suggestions to increase sets/reps as appropriate until she sees him. Encouraged to ambulate in her house with RW for aerobic endurance. Patient verbalized understanding of these recommendations and was in agreement with this plan.

## 2021-09-24 ENCOUNTER — Encounter: Payer: Self-pay | Admitting: Podiatry

## 2021-09-24 ENCOUNTER — Other Ambulatory Visit: Payer: Self-pay | Admitting: Podiatry

## 2021-09-24 ENCOUNTER — Ambulatory Visit: Payer: 59

## 2021-09-24 MED ORDER — HYDROCODONE-ACETAMINOPHEN 5-325 MG PO TABS: 1.0000 | ORAL_TABLET | Freq: Four times a day (QID) | ORAL | 0 refills | Status: DC | PRN

## 2021-09-25 NOTE — Telephone Encounter (Signed)
Error message

## 2021-10-02 ENCOUNTER — Other Ambulatory Visit: Payer: Self-pay

## 2021-10-02 ENCOUNTER — Encounter: Payer: Self-pay | Admitting: Podiatry

## 2021-10-02 ENCOUNTER — Ambulatory Visit (INDEPENDENT_AMBULATORY_CARE_PROVIDER_SITE_OTHER): Payer: 59 | Admitting: Podiatry

## 2021-10-02 DIAGNOSIS — S92002D Unspecified fracture of left calcaneus, subsequent encounter for fracture with routine healing: Secondary | ICD-10-CM | POA: Diagnosis not present

## 2021-10-02 DIAGNOSIS — S96919D Strain of unspecified muscle and tendon at ankle and foot level, unspecified foot, subsequent encounter: Secondary | ICD-10-CM

## 2021-10-02 MED ORDER — GABAPENTIN 300 MG PO CAPS: 300.0000 mg | ORAL_CAPSULE | Freq: Every day | ORAL | 0 refills | Status: DC

## 2021-10-02 MED ORDER — CELECOXIB 100 MG PO CAPS: 100.0000 mg | ORAL_CAPSULE | Freq: Two times a day (BID) | ORAL | 0 refills | Status: DC

## 2021-10-03 ENCOUNTER — Ambulatory Visit: Payer: 59

## 2021-10-03 DIAGNOSIS — M79672 Pain in left foot: Secondary | ICD-10-CM | POA: Diagnosis not present

## 2021-10-03 DIAGNOSIS — M25672 Stiffness of left ankle, not elsewhere classified: Secondary | ICD-10-CM

## 2021-10-03 DIAGNOSIS — M6281 Muscle weakness (generalized): Secondary | ICD-10-CM

## 2021-10-03 DIAGNOSIS — R262 Difficulty in walking, not elsewhere classified: Secondary | ICD-10-CM

## 2021-10-03 NOTE — Progress Notes (Signed)
Subjective: Haley White is a 65 y.o. is seen today in office s/p left peroneal tendon repair, anastomosis subsequently she had a fall and had a fracture posterior calcaneus.  She presents here for cast change.  Still in some discomfort but seems to be somewhat improved.  Asking for refill of Celebrex as well as gabapentin.  She said that she had gabapentin 3 mg at home that she was taking and she felt well with this although the prescription has expired.  No recent injury or changes otherwise telemetry saw her she has no new concerns.    Objective: General: No acute distress, AAOx3  DP/PT pulses palpable 2/4, CRT < 3 sec to all digits.  Protective sensation intact. Motor function intact.  Left foot: Cast is clean, dry, intact which was removed today.  Incision site along the peroneal tendon has healed with a scar.  There is decreased edema.  There is decreased tenderness palpation on surgical site but still some discomfort of the calcaneus.  Achilles tendon appears to be intact.  No other areas of point point tenderness.   No pain with calf compression, swelling, warmth, erythema.   MRI 09/08/2021 IMPRESSION: 1. Insufficiency fracture of the calcaneal tuberosity superiorly, near the Achilles insertion, as can be seen associated with diabetes. No displaced fracture or Achilles tendon tear identified. 2. Postsurgical changes related to repair of the previously demonstrated longitudinal split tear of the peroneus brevis tendon. No new tendon findings. 3. Stable subchondral cysts in the lateral talar dome and midfoot degenerative changes. No other acute osseous findings.  Assessment and Plan:  Status post left peroneal tendon anastomosis; calcaneal fracture  -Treatment options discussed including all alternatives, risks, and complications -Cast was changed today.  A well-padded below-knee fiberglass cast was applied making sure to pad all bony prominences.  Continue nonweightbearing, ice  elevation.  Refilled medications including Celebrex, gabapentin today.  Continue to ice and elevate.  Return in about 2 weeks (around 10/16/2021).  X-ray next appointment  Trula Slade DPM

## 2021-10-03 NOTE — Therapy (Signed)
[facility] [place], [place], [address] Phone: [phone]   Fax:  [phone]  Physical Therapy Treatment  Patient Details  Name: Haley White MRN: [id number] Date of Birth: [birth year] Referring Provider (PT): Trula Slade, [place]   Encounter Date: 10/03/2021   PT End of Session - 10/03/21 1448     Visit Number 4    Number of Visits 17    Date for PT Re-Evaluation 10/25/21    Authorization Type self-pay    PT Start Time 0037    PT Stop Time 1529    PT Time Calculation (min) 42 min    Equipment Utilized During Treatment Other (comment)   scooter   Activity Tolerance Patient tolerated treatment well    Behavior During Therapy [facility] for tasks assessed/performed             Past Medical History:  Diagnosis Date   Anxiety    Arthritis    Asthma    Cataract    Gout    Hyperlipidemia    Hypertension    Obese     Past Surgical History:  Procedure Laterality Date   APPENDECTOMY  1984   CATARACT EXTRACTION W/ INTRAOCULAR LENS  IMPLANT, BILATERAL  2014   CESAREAN SECTION  1984, 1987, 1994   X3   COLONOSCOPY  11/16/2013   w/Brodie    FOOT SURGERY Left 2002   KNEE SURGERY Left 1998   ROTATOR CUFF REPAIR Right 2007   TUBAL LIGATION  1994    There were no vitals filed for this visit.   Subjective Assessment - 10/03/21 1448     Subjective "I'm going to warn you I'm hurting a little bit more today." She had f/u with Dr. Jacqualyn Posey yesterday and was re-casted until next f/u on 10/16/21. She reports overall compliance with HEP and reports her exercises are getting easier.    Currently in Pain? Yes    Pain Score 5     Pain Location Foot    Pain Orientation Left    Pain Descriptors / Indicators Throbbing;Dull    Pain Type Acute pain    Pain Onset 1 to 4 weeks ago    Pain Frequency Constant    Aggravating Factors  weather, community ambulation    Pain Relieving Factors rest, ice, elevation                 [facility] PT Assessment - 10/03/21 0001       Strength   Right Hip ABduction 4/5    Left Hip ABduction 4/5                 [facility] Adult PT Treatment/Exercise:  Therapeutic Exercise: - SLR 2 x 10 with 2 lb bilateral - SL bridge 2 x 8 RLE only  - sidelying hip circles CW/CCW 2 x 10 each bilateral  - LAQ 2 x 10 with 2 lb bilateral  - quadruped alternating leg extension 2 x 10  - updated Hep and issued black theraband for clamshell   Manual Therapy: - n/a  Neuromuscular re-ed: - n/a  Therapeutic Activity: - n/a  Self-care/Home Management: - n/a                      PT Short Term Goals - 10/03/21 1453       PT SHORT TERM GOAL #1   Title Pt will report understanding and adherence to her HEP in order to promote independence in  the management of her primary impairments.    Baseline issued at eval.    Time 4    Period Weeks    Status Achieved    Target Date 09/27/21      PT SHORT TERM GOAL #2   Title Pt will demonstrate ability to tolerate lateral weightshifting in standing in order to transition to walking with less limitation.    Baseline currently NWB LLE due to fracture    Time 4    Period Weeks    Status Not Met               PT Long Term Goals - 08/30/21 1421       PT LONG TERM GOAL #1   Title Pt will achieve a FOTO score of 55% or higher in order to demonstrate improved functional ability as it relates to her L ankle pain.    Baseline 43%    Time 8    Period Weeks    Status New    Target Date 10/25/21      PT LONG TERM GOAL #2   Title Pt will demonstrate the ability to perform a 6-minute walk test without CAM boot or AD at self-selected pace in order to progress to safe community ambulation.    Baseline Pt unable to walk without CAM boot and axillary crutches.    Time 8    Period Weeks    Status New    Target Date 10/25/21      PT LONG TERM GOAL #3   Title Pt will demonstrate L ankle eversion and inversion of 20  degrees with 0-2/10 pain in order to put on her socks and shoes without limitation.    Baseline See flowsheet    Time 8    Period Weeks    Status New    Target Date 10/25/21      PT LONG TERM GOAL #4   Title Pt will demonstrate BIL global hip strength of 5/5 in order to independently progress her LE strengthening regimen without limitation.    Baseline See flowsheet8    Period Weeks    Status New    Target Date 10/25/21                   Plan - 10/03/21 1455     Clinical Impression Statement Patient continues to be NWB about the LLE after f/u with Dr. Jacqualyn Posey yesterday. She has been consistent with her strengthening exercises since last session, so able to progress BLE strengthening today while maintaining NWB status. Hip abduction strength has moderately improved compared to initial evaluation. Overall good tolerance to strengthening without reports of increased pain.    Personal Factors and Comorbidities Comorbidity 3+;Past/Current Experience    Comorbidities OA, HTN, anxety, multiple ankle injuries/ surgeries    Examination-Activity Limitations --    Examination-Participation Restrictions --    PT Treatment/Interventions ADLs/Self Care Home Management;Cryotherapy;Software engineer;Therapeutic activities;Therapeutic exercise;Balance training;Neuromuscular re-education;Patient/family education;Manual techniques;Passive range of motion;Dry needling;Taping;Vasopneumatic Device;Stair training;Aquatic Therapy;Iontophoresis 36m/ml Dexamethasone    PT Next Visit Plan continue to progress LE strength as tolerated    PT Home Exercise Plan Access Code [email or web address]    Consulted and Agree with Plan of Care Patient             Patient will benefit from skilled therapeutic intervention in order to improve the following deficits and impairments:  Abnormal gait, Decreased range of motion, Difficulty walking, Decreased endurance, Decreased activity tolerance,  Pain, Decreased balance,  Impaired flexibility, Decreased strength, Increased edema  Visit Diagnosis: Pain in left foot  Difficulty in walking, not elsewhere classified  Stiffness of left ankle, not elsewhere classified  Muscle weakness (generalized)     Problem List Patient Active Problem List   Diagnosis Date Noted   Allergic rhinitis 03/15/2021   Allergic rhinitis due to animal (cat) (dog) hair and dander 03/15/2021   Allergic rhinitis due to pollen 03/15/2021   Gastro-esophageal reflux disease without esophagitis 03/15/2021   Mild persistent asthma, uncomplicated 32/25/6720   Aortic atherosclerosis ([place]) by Chest CT scan on 02/02/2018  01/10/2021   Asthma    Low back pain 03/17/2019   Lumbosacral spondylosis without myelopathy 03/17/2019   Cubital tunnel syndrome 10/14/2018   Closed fracture of distal end of right radius 07/09/2018   Pseudogout 12/09/2017   Major depressive disorder, recurrent, severe without psychotic features ([place])    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 [place], [place], [address] Phone: [phone]   Fax:  [phone]  Name: VETA DAMBROSIA MRN: [id number] Date of Birth: 09/14/1956

## 2021-10-04 ENCOUNTER — Encounter: Payer: Self-pay | Admitting: Podiatry

## 2021-10-04 ENCOUNTER — Other Ambulatory Visit: Payer: Self-pay | Admitting: Podiatry

## 2021-10-04 MED ORDER — CELECOXIB 100 MG PO CAPS: 100.0000 mg | ORAL_CAPSULE | Freq: Two times a day (BID) | ORAL | 0 refills | Status: DC

## 2021-10-08 ENCOUNTER — Other Ambulatory Visit: Payer: Self-pay | Admitting: Podiatry

## 2021-10-08 MED ORDER — CELECOXIB 100 MG PO CAPS: 100.0000 mg | ORAL_CAPSULE | Freq: Two times a day (BID) | ORAL | 0 refills | Status: DC

## 2021-10-09 ENCOUNTER — Other Ambulatory Visit: Payer: Self-pay | Admitting: Podiatry

## 2021-10-09 MED ORDER — HYDROCODONE-ACETAMINOPHEN 5-325 MG PO TABS: 1.0000 | ORAL_TABLET | Freq: Four times a day (QID) | ORAL | 0 refills | Status: DC | PRN

## 2021-10-10 ENCOUNTER — Ambulatory Visit: Payer: 59

## 2021-10-15 ENCOUNTER — Ambulatory Visit: Payer: 59

## 2021-10-16 ENCOUNTER — Ambulatory Visit (INDEPENDENT_AMBULATORY_CARE_PROVIDER_SITE_OTHER): Payer: 59 | Admitting: Podiatry

## 2021-10-16 ENCOUNTER — Ambulatory Visit (INDEPENDENT_AMBULATORY_CARE_PROVIDER_SITE_OTHER): Payer: 59

## 2021-10-16 ENCOUNTER — Other Ambulatory Visit: Payer: Self-pay

## 2021-10-16 ENCOUNTER — Encounter: Payer: Self-pay | Admitting: Podiatry

## 2021-10-16 DIAGNOSIS — S92002D Unspecified fracture of left calcaneus, subsequent encounter for fracture with routine healing: Secondary | ICD-10-CM

## 2021-10-16 DIAGNOSIS — S96919D Strain of unspecified muscle and tendon at ankle and foot level, unspecified foot, subsequent encounter: Secondary | ICD-10-CM | POA: Diagnosis not present

## 2021-10-17 ENCOUNTER — Ambulatory Visit: Payer: 59

## 2021-10-18 ENCOUNTER — Encounter: Payer: Self-pay | Admitting: Podiatry

## 2021-10-19 ENCOUNTER — Other Ambulatory Visit: Payer: Self-pay | Admitting: Podiatry

## 2021-10-19 MED ORDER — HYDROCODONE-ACETAMINOPHEN 5-325 MG PO TABS: 1.0000 | ORAL_TABLET | Freq: Four times a day (QID) | ORAL | 0 refills | Status: DC | PRN

## 2021-10-19 NOTE — Progress Notes (Signed)
Subjective: Haley White is a 65 y.o. is seen today in office s/p left peroneal tendon repair, anastomosis subsequently she had a fall and had a fracture posterior calcaneus.  She states that she still having some discomfort but seems to be doing better.  She has been nonweightbearing.  She still thinks that pain medicine as needed but she is on Celebrex as well as gabapentin.  At max her pain level is 7-8/10.  No new injury or falls.  No new concerns today.    Objective: General: No acute distress, AAOx3  DP/PT pulses palpable 2/4, CRT < 3 sec to all digits.  Protective sensation intact. Motor function intact.  Left foot: Incision site along the peroneal tendon has healed with a scar.  There is some tenderness to palpation of the course the peroneal tendons today.  Mild discomfort present along the calcaneus.  Thompson test is negative and Achilles tendon appears to be intact.  No other areas of discomfort. No pain with calf compression, swelling, warmth, erythema.   MRI 09/08/2021 IMPRESSION: 1. Insufficiency fracture of the calcaneal tuberosity superiorly, near the Achilles insertion, as can be seen associated with diabetes. No displaced fracture or Achilles tendon tear identified. 2. Postsurgical changes related to repair of the previously demonstrated longitudinal split tear of the peroneus brevis tendon. No new tendon findings. 3. Stable subchondral cysts in the lateral talar dome and midfoot degenerative changes. No other acute osseous findings.  Assessment and Plan:  Status post left peroneal tendon anastomosis; calcaneal fracture  -Treatment options discussed including all alternatives, risks, and complications -Repeat x-rays obtained reviewed.  No evidence of acute fracture.  Some sclerosis of the posterior superior portion of calcaneus likely from healing fracture. -Already transition to the cam boot.  New boot was dispensed today as hers was worn out.  I did a heel lift.   Continue nonweightbearing.  We will start physical therapy and discussed gradual strengthening.  For now I remain nonweightbearing but as she starts physical therapy we can transition to partial weightbearing as tolerated.  Continue to ice and elevate as well.  Trula Slade DPM

## 2021-10-23 ENCOUNTER — Other Ambulatory Visit: Payer: Self-pay

## 2021-10-23 ENCOUNTER — Ambulatory Visit: Payer: 59 | Attending: Podiatry

## 2021-10-23 DIAGNOSIS — M79672 Pain in left foot: Secondary | ICD-10-CM | POA: Diagnosis present

## 2021-10-23 DIAGNOSIS — M6281 Muscle weakness (generalized): Secondary | ICD-10-CM | POA: Diagnosis present

## 2021-10-23 DIAGNOSIS — R262 Difficulty in walking, not elsewhere classified: Secondary | ICD-10-CM | POA: Insufficient documentation

## 2021-10-23 DIAGNOSIS — M25672 Stiffness of left ankle, not elsewhere classified: Secondary | ICD-10-CM | POA: Insufficient documentation

## 2021-10-23 NOTE — Therapy (Signed)
[place], [place], [address] Phone: [phone]   Fax:  [phone]  Physical Therapy Treatment  Patient Details  Name: Haley White MRN: [id number] Date of Birth: 02/17/1956 Referring Provider (PT): Trula Slade, [place]   Encounter Date: 10/23/2021   PT End of Session - 10/23/21 1831     Visit Number 5    Number of Visits 17    Date for PT Re-Evaluation 10/25/21    Authorization Type self-pay    PT Start Time 0034    PT Stop Time 1913    PT Time Calculation (min) 42 min    Equipment Utilized During Treatment Other (comment)   cam boot and axillary crutches   Activity Tolerance Patient tolerated treatment well    Behavior During Therapy Haley White for tasks assessed/performed             Past Medical History:  Diagnosis Date   Anxiety    Arthritis    Asthma    Cataract    Gout    Hyperlipidemia    Hypertension    Obese     Past Surgical History:  Procedure Laterality Date   APPENDECTOMY  1984   CATARACT EXTRACTION W/ INTRAOCULAR LENS  IMPLANT, BILATERAL  2014   CESAREAN SECTION  1984, 1987, 1994   X3   COLONOSCOPY  11/16/2013   w/Brodie    FOOT SURGERY Left 2002   [place] Right 2007   TUBAL LIGATION  1994    There were no vitals filed for this visit.   Subjective Assessment - 10/23/21 1834     Subjective Patient had f/u with Dr. Jacqualyn Posey last Tuesday and the cast was removed and was placed in the CAM boot and instructed to be NWB for at least week. She reports having two good days the past two days without need for pain meds and no major pain.    Currently in Pain? No/denies                [facility] PT Assessment - 10/23/21 0001       Restrictions   LLE Weight Bearing --   Per Dr. Jacqualyn Posey can be PWB in CAM boot as tolerated               TREATMENT 10/23/2021: Gait training: - PWB with CAM boot and axillary crutch x 75 ft step  to pattern.   Therapeutic Exercise: - long sit calf stretch 2 x 30 sec  - ankle pumps 1 x 10  - toe curls 1 x 10  - updated HEP.   Manual Therapy: - gentle PROM of Lt foot and digits to tolerance in all planes                          PT Short Term Goals - 10/03/21 1453       PT SHORT TERM GOAL #1   Title Pt will report understanding and adherence to her HEP in order to promote independence in the management of her primary impairments.    Baseline issued at eval.    Time 4    Period Weeks    Status Achieved    Target Date 09/27/21      PT SHORT TERM GOAL #2   Title Pt will demonstrate ability to tolerate lateral weightshifting in standing in order to transition to walking with less limitation.  Baseline currently NWB LLE due to fracture    Time 4    Period Weeks    Status Not Met               PT Long Term Goals - 08/30/21 1421       PT LONG TERM GOAL #1   Title Pt will achieve a FOTO score of 55% or higher in order to demonstrate improved functional ability as it relates to her L ankle pain.    Baseline 43%    Time 8    Period Weeks    Status New    Target Date 10/25/21      PT LONG TERM GOAL #2   Title Pt will demonstrate the ability to perform a 6-minute walk test without CAM boot or AD at self-selected pace in order to progress to safe community ambulation.    Baseline Pt unable to walk without CAM boot and axillary crutches.    Time 8    Period Weeks    Status New    Target Date 10/25/21      PT LONG TERM GOAL #3   Title Pt will demonstrate L ankle eversion and inversion of 20 degrees with 0-2/10 pain in order to put on her socks and shoes without limitation.    Baseline See flowsheet    Time 8    Period Weeks    Status New    Target Date 10/25/21      PT LONG TERM GOAL #4   Title Pt will demonstrate BIL global hip strength of 5/5 in order to independently progress her LE strengthening regimen without limitation.     Baseline See flowsheet8    Period Weeks    Status New    Target Date 10/25/21                   Plan - 10/23/21 1851     Clinical Impression Statement Patient arrives to PT with CAM boot donned and per Dr. Jacqualyn Posey is allowed to begin [facility] on the LLE as tolerated. Good tolerance to PROM with patient reporting mild pulling sensation at end range of passive inversion. Able to begin gentle stretching and ROM of the Lt ankle without increased pain. She was able to complete PWB in cam boot utilizing axillary crutches and step to gait pattern for short distance without reports of pain and demonstrated excellent balance. She was encouraged to begin [place]aley White for household ambulation at this time with patient verbalizing understanding.    Personal Factors and Comorbidities Comorbidity 3+;Past/Current Experience    Comorbidities OA, HTN, anxety, multiple ankle injuries/ surgeries    PT Treatment/Interventions ADLs/Self Care Home Management;Cryotherapy;Software engineer;Therapeutic activities;Therapeutic exercise;Balance training;Neuromuscular re-education;Patient/family education;Manual techniques;Passive range of motion;Dry needling;Taping;Vasopneumatic Device;Stair training;Aquatic Therapy;Iontophoresis 57m/ml Dexamethasone    PT Next Visit Plan progress PWB as tolerated, manual to Lt ankle, ROM and strengthening as tolerated    PT Home Exercise Plan Access Code [email or web address]    Consulted and Agree with Plan of Care Patient             Patient will benefit from skilled therapeutic intervention in order to improve the following deficits and impairments:  Abnormal gait, Decreased range of motion, Difficulty walking, Decreased endurance, Decreased activity tolerance, Pain, Decreased balance, Impaired flexibility, Decreased strength, Increased edema  Visit Diagnosis: Pain in left foot  Difficulty in walking, not elsewhere classified  Stiffness of left ankle, not elsewhere  classified  Muscle weakness (generalized)  Problem List Patient Active Problem List   Diagnosis Date Noted   Allergic rhinitis 03/15/2021   Allergic rhinitis due to animal (cat) (dog) hair and dander 03/15/2021   Allergic rhinitis due to pollen 03/15/2021   Gastro-esophageal reflux disease without esophagitis 03/15/2021   Mild persistent asthma, uncomplicated 66/64/8616   Aortic atherosclerosis ([place]) by Chest CT scan on 02/02/2018  01/10/2021   Asthma    Low back pain 03/17/2019   Lumbosacral spondylosis without myelopathy 03/17/2019   Cubital tunnel syndrome 10/14/2018   Closed fracture of distal end of right radius 07/09/2018   Pseudogout 12/09/2017   Major depressive disorder, recurrent, severe without psychotic features ([place])    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 [place], [place], [address] Phone: [phone]   Fax:  [phone]  Name: MAZIAH SMOLA MRN: [id number] Date of Birth: [birth year]

## 2021-10-30 ENCOUNTER — Encounter: Payer: Self-pay | Admitting: Podiatry

## 2021-10-30 NOTE — Telephone Encounter (Signed)
Please advise 

## 2021-10-31 ENCOUNTER — Other Ambulatory Visit: Payer: Self-pay | Admitting: Podiatry

## 2021-10-31 DIAGNOSIS — N289 Disorder of kidney and ureter, unspecified: Secondary | ICD-10-CM

## 2021-10-31 MED ORDER — CELECOXIB 100 MG PO CAPS: 100.0000 mg | ORAL_CAPSULE | Freq: Two times a day (BID) | ORAL | 0 refills | Status: DC

## 2021-11-01 ENCOUNTER — Other Ambulatory Visit: Payer: Self-pay | Admitting: Adult Health

## 2021-11-01 LAB — BASIC METABOLIC PANEL
BUN/Creatinine Ratio: 12 (ref 12–28)
BUN: 12 mg/dL (ref 8–27)
CO2: 25 mmol/L (ref 20–29)
Calcium: 9.4 mg/dL (ref 8.7–10.3)
Chloride: 104 mmol/L (ref 96–106)
Creatinine, Ser: 0.97 mg/dL (ref 0.57–1.00)
Glucose: 127 mg/dL — ABNORMAL HIGH (ref 70–99)
Potassium: 4.2 mmol/L (ref 3.5–5.2)
Sodium: 144 mmol/L (ref 134–144)
eGFR: 65 mL/min/{1.73_m2} (ref >59)

## 2021-11-02 ENCOUNTER — Ambulatory Visit: Payer: 59 | Admitting: Internal Medicine

## 2021-11-06 ENCOUNTER — Ambulatory Visit (INDEPENDENT_AMBULATORY_CARE_PROVIDER_SITE_OTHER): Payer: Managed Care, Other (non HMO) | Admitting: Podiatry

## 2021-11-06 ENCOUNTER — Other Ambulatory Visit: Payer: Self-pay

## 2021-11-06 DIAGNOSIS — S96919D Strain of unspecified muscle and tendon at ankle and foot level, unspecified foot, subsequent encounter: Secondary | ICD-10-CM | POA: Diagnosis not present

## 2021-11-06 DIAGNOSIS — S92002D Unspecified fracture of left calcaneus, subsequent encounter for fracture with routine healing: Secondary | ICD-10-CM | POA: Diagnosis not present

## 2021-11-07 ENCOUNTER — Ambulatory Visit: Payer: Managed Care, Other (non HMO) | Attending: Podiatry

## 2021-11-07 DIAGNOSIS — R262 Difficulty in walking, not elsewhere classified: Secondary | ICD-10-CM | POA: Insufficient documentation

## 2021-11-07 DIAGNOSIS — M25672 Stiffness of left ankle, not elsewhere classified: Secondary | ICD-10-CM | POA: Insufficient documentation

## 2021-11-07 DIAGNOSIS — M79672 Pain in left foot: Secondary | ICD-10-CM | POA: Insufficient documentation

## 2021-11-07 DIAGNOSIS — M6281 Muscle weakness (generalized): Secondary | ICD-10-CM | POA: Diagnosis present

## 2021-11-07 NOTE — Patient Instructions (Signed)
Aquatic Therapy at [facility]-  What to Expect!  Where:   [facility] [address] [facility] [address] [place], [place] [address] Rehab phone [phone]  NOTE:  You will receive an automated phone message reminding you of your appt and it will say the appointment is at the [address] [facility] [address] [facility] clinic.          How to Prepare: Please make sure you drink 8 ounces of water about one hour prior to your pool session A caregiver may attend if needed with the patient to help assist as needed. A caregiver can sit in the pool room on chair. Please arrive IN YOUR SUIT and 15 minutes prior to your appointment - this helps to avoid delays in starting your session. Please make sure to attend to any toileting needs prior to entering the pool Locker rooms for changing are provided.   There is direct access to the pool deck form the locker room.  You can lock your belongings in a locker with lock provided. Once on the pool deck your therapist will ask if you have signed the Patient  Consent and Assignment of Benefits form before beginning treatment Your therapist may take your blood pressure prior to, during and after your session if indicated We usually try and create a home exercise program based on activities we do in the pool.  Please be thinking about who might be able to assist you in the pool should you need to participate in an aquatic home exercise program at the time of discharge if you need assistance.  Some patients do not want to or do not have the ability to participate in an aquatic home program - this is not a barrier in any way to you participating in aquatic therapy as part of your current therapy plan! After Discharge from PT, you can continue using home program at  the [facility]/, there is a drop-in fee for $5 ($45 a month)or for 60 years  or older $4.00 ($40 a month for seniors ) or any local [employer] pool.  Memberships for purchase are  available for gym/pool at [facility]  IT IS VERY IMPORTANT THAT YOUR LAST VISIT BE IN THE CLINIC AT [address] AFTER YOUR LAST AQUATIC VISIT.  PLEASE MAKE SURE THAT YOU HAVE A [address]  APPOINTMENT SCHEDULED.   About the pool: Pool is located approximately 500 FT from the entrance of the building.  Please bring a support person if you need assistance traveling this      distance.   Your therapist will assist you in entering the water; there are two ways to           enter: stairs with railings, and a mechanical lift. Your therapist will determine the most appropriate way for you.  Water temperature is usually between 88-90 degrees  There may be up to 2 other swimmers in the pool at the same time  The pool deck is tile, please wear shoes with good traction if you prefer not to be barefoot.    Contact Info:  For appointment scheduling and cancellations:         Please call the [facility]  PH:[phone]              Aquatic Therapy  Outpatient Rehabilitation @ [facility]       All sessions are 45 minutes                                                    

## 2021-11-07 NOTE — Therapy (Signed)
[place], [place], [address] Phone: [phone]   Fax:  [phone]  Physical Therapy Treatment  Patient Details  Name: Haley White MRN: [id number] Date of Birth: 1956/05/13 Referring Provider (PT): Trula Slade, [place]   Encounter Date: 11/07/2021   PT End of Session - 11/07/21 1025     Visit Number 6    Number of Visits 22    Date for PT Re-Evaluation 01/05/22    Authorization Type [employer]    PT Start Time 1025   patient late   PT Stop Time 1100    PT Time Calculation (min) 35 min    Equipment Utilized During Treatment Other (comment)   cam boot and axillary crutches   Activity Tolerance Patient tolerated treatment well    Behavior During Therapy [facility] for tasks assessed/performed             Past Medical History:  Diagnosis Date   Anxiety    Arthritis    Asthma    Cataract    Gout    Hyperlipidemia    Hypertension    Obese     Past Surgical History:  Procedure Laterality Date   APPENDECTOMY  1984   CATARACT EXTRACTION W/ INTRAOCULAR LENS  IMPLANT, BILATERAL  2014   CESAREAN SECTION  1984, 1987, 1994   X3   COLONOSCOPY  11/16/2013   w/Brodie    FOOT SURGERY Left 2002   [place] Right 2007   TUBAL LIGATION  1994    There were no vitals filed for this visit.   Subjective Assessment - 11/07/21 1027     Subjective Patient had f/u with Dr. Jacqualyn Posey yesterday who was pleased with her progress and was instructed that we can begin weightbearing in a shoe as tolerated. Since last session she was been WBAT in the CAM boot and using crutches for household ambulation and still utilizing knee scooter for community ambulation. Patient reports subjective overall improvement of 60% needing continued focus on full weight-bearing, tolerance to walking/standing, and balance. She reports her pain has been manageable, though can flare up if she is on her feet for  too long.    How long can you sit comfortably? unlimited    How long can you stand comfortably? 10 minutes    How long can you walk comfortably? household distance    Currently in Pain? No/denies                [facility] PT Assessment - 11/07/21 0001       Assessment   Medical Diagnosis Tendon tear, ankle, subsequent encounter (B20.100F)    Referring Provider (PT) Trula Slade, DPM      Restrictions   LLE Weight Bearing Weight bearing as tolerated      Observation/Other Assessments   Focus on Therapeutic Outcomes (FOTO)  50%      AROM   Overall AROM Comments pain/tightness at end range of inverison and eversion    Left Ankle Dorsiflexion 11    Left Ankle Inversion 12    Left Ankle Eversion 15      Strength   Overall Strength Comments pain with Lt ankle MMT    Right Hip Flexion 5/5    Right Hip Extension 4+/5    Right Hip ABduction 4+/5    Left Hip Flexion 5/5    Left Hip Extension 4+/5    Left Hip ABduction 4+/5  Left Ankle Dorsiflexion 4+/5    Left Ankle Plantar Flexion 4+/5   sitting   Left Ankle Inversion 4/5    Left Ankle Eversion 4/5      Ambulation/Gait   Ambulation/Gait Yes    Ambulation Distance (Feet) 15 Feet    Assistive device Crutches   CAM boot   Gait Pattern Step-through pattern               TREATMENT 11/07/2021:  Therapeutic Exercise: - Inversion and Eversion Towel slides x 5 each - updated HEP  Manual Therapy: - N/A  Neuromuscular re-ed: - N/A  Therapeutic Activity: - Education on re-assessment findings, progress towards goals and remaining impairments that will be addressed during POC.   Gait training: - lateral weight shifts x 5; tennis shoe donned - fwd/bwd wight shifts x 5; tennis shoe donned  - stair training with CAM boot and axillary crutches Self-care/Home Management: - N/A                        PT Short Term Goals - 11/07/21 1032       PT SHORT TERM GOAL #1   Title Pt will report  understanding and adherence to her HEP in order to promote independence in the management of her primary impairments.    Baseline issued at eval.    Time 4    Period Weeks    Status Achieved    Target Date 09/27/21      PT SHORT TERM GOAL #2   Title Pt will demonstrate ability to tolerate lateral weightshifting in standing in order to transition to walking with less limitation.    Baseline able to weightshift in standing with tennis shoe with mild pain. 11/07/21    Time 4    Period Weeks    Status Achieved               PT Long Term Goals - 11/07/21 1032       PT LONG TERM GOAL #1   Title Pt will achieve a FOTO score of 55% or higher in order to demonstrate improved functional ability as it relates to her L ankle pain.    Baseline see flowsheet    Time 8    Period Weeks    Status On-going    Target Date 10/25/21      PT LONG TERM GOAL #2   Title Pt will demonstrate the ability to perform a 6-minute walk test without CAM boot or AD at self-selected pace in order to progress to safe community ambulation.    Baseline currently WBAT in CAM boot and crutches    Time 8    Period Weeks    Status Unable to assess    Target Date 10/25/21      PT LONG TERM GOAL #3   Title Pt will demonstrate Lt ankle inversion/eversion AROM WNL with 0-2/10 pain in order to put on her socks and shoes without limitation.    Baseline See flowsheet    Time 8    Period Weeks    Status Revised    Target Date 10/25/21      PT LONG TERM GOAL #4   Title Pt will demonstrate BIL global hip strength of 5/5 in order to independently progress her LE strengthening regimen without limitation.    Baseline See flowsheet    Period Weeks    Status Partially Met    Target Date 10/25/21  PT LONG TERM GOAL #5   Title Patient will demonstrate 5/5 Lt ankle strength to improve stability necessary for community ambulation.    Baseline see flowsheet    Time 4    Period Weeks    Status New    Target Date  12/05/21      Additional Long Term Goals   Additional Long Term Goals Yes      PT LONG TERM GOAL #6   Title Patient will maintain SLS on the LLE for at least 5 seconds to improve stability when walking on uneven terrain.    Baseline unable    Time 8    Period Weeks    Status New    Target Date 01/02/22                   Plan - 11/07/21 1058     Clinical Impression Statement Deshundra is progressing as expected given her prolonged immobilization of the Lt foot after sustaining an insufficiency fracture of the calcaneus from a fall off of her scooter on 09/01/21 following her most recent surgical procedure that took place on 06/27/21. She demonstrates normalized Lt ankle dorsiflexion and eversion AROM, though remains limited into inversion at this time. She has mild weakness and pain with inverison and eversion MMT of the Lt ankle currently. She is tolerating recent gradual progression into weightbearing well with ability to begin weight shifting in her tennis shoe today with minimal pain. At this time it was recommended that she continue household ambulation and begin community ambulation with the CAM boot and axillary crutches with planned progression into full weightbearing as tolerated. She will benefit from continued skilled PT to address lingering ROM, strength, balance, and gait impairments in order to optimize her overall function.    Personal Factors and Comorbidities Comorbidity 3+;Past/Current Experience    Comorbidities OA, HTN, anxety, multiple ankle injuries/ surgeries    Rehab Potential Good    PT Frequency 2x / week    PT Duration 8 weeks    PT Treatment/Interventions ADLs/Self Care Home Management;Cryotherapy;Software engineer;Therapeutic activities;Therapeutic exercise;Balance training;Neuromuscular re-education;Patient/family education;Manual techniques;Passive range of motion;Dry needling;Taping;Vasopneumatic Device;Stair training;Aquatic  Therapy;Iontophoresis 40m/ml Dexamethasone;Ultrasound    PT Next Visit Plan progress WBAT, consider single axillary crutch, progress ankle ROM and strengthening as tolerated.    PT Home Exercise Plan Access Code [email or web address]    Recommended Other Services aquatic therapy    Consulted and Agree with Plan of Care Patient             Patient will benefit from skilled therapeutic intervention in order to improve the following deficits and impairments:  Abnormal gait, Decreased range of motion, Difficulty walking, Decreased endurance, Decreased activity tolerance, Pain, Decreased balance, Impaired flexibility, Decreased strength, Increased edema  Visit Diagnosis: Pain in left foot  Difficulty in walking, not elsewhere classified  Stiffness of left ankle, not elsewhere classified  Muscle weakness (generalized)     Problem List Patient Active Problem List   Diagnosis Date Noted   Allergic rhinitis 03/15/2021   Allergic rhinitis due to animal (cat) (dog) hair and dander 03/15/2021   Allergic rhinitis due to pollen 03/15/2021   Gastro-esophageal reflux disease without esophagitis 03/15/2021   Mild persistent asthma, uncomplicated 012/87/8676  [place] on 02/02/2018  01/10/2021   Asthma    Low back pain 03/17/2019   Lumbosacral spondylosis without myelopathy 03/17/2019   Cubital tunnel syndrome 10/14/2018   Closed fracture of distal end of  right radius 07/09/2018   Pseudogout 12/09/2017   Major depressive disorder, recurrent, severe without psychotic features ([place])    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 [place], [place], [address] Phone: [phone]   Fax:  [phone]  Name: BRAYA HABERMEHL MRN: [id number] Date of Birth: [birth year]

## 2021-11-08 ENCOUNTER — Other Ambulatory Visit: Payer: Self-pay

## 2021-11-08 ENCOUNTER — Ambulatory Visit: Payer: Managed Care, Other (non HMO)

## 2021-11-08 DIAGNOSIS — R262 Difficulty in walking, not elsewhere classified: Secondary | ICD-10-CM

## 2021-11-08 DIAGNOSIS — M79672 Pain in left foot: Secondary | ICD-10-CM | POA: Diagnosis not present

## 2021-11-08 DIAGNOSIS — M25672 Stiffness of left ankle, not elsewhere classified: Secondary | ICD-10-CM

## 2021-11-08 DIAGNOSIS — M6281 Muscle weakness (generalized): Secondary | ICD-10-CM

## 2021-11-08 NOTE — Therapy (Signed)
[place], [place], [address] Phone: [phone]   Fax:  [phone]  Physical Therapy Treatment  Patient Details  Name: Haley White MRN: [id number] Date of Birth: 03-Jul-1956 Referring Provider (PT): Trula Slade, [place]   Encounter Date: 11/08/2021   PT End of Session - 11/08/21 1231     Visit Number 7    Number of Visits 22    Date for PT Re-Evaluation 01/05/22    Authorization Type [employer]    PT Start Time 1231    PT Stop Time 1314    PT Time Calculation (min) 43 min    Equipment Utilized During Treatment Other (comment)   cam boot and axillary crutches   Activity Tolerance Patient tolerated treatment well    Behavior During Therapy [facility] for tasks assessed/performed             Past Medical History:  Diagnosis Date   Anxiety    Arthritis    Asthma    Cataract    Gout    Hyperlipidemia    Hypertension    Obese     Past Surgical History:  Procedure Laterality Date   APPENDECTOMY  1984   CATARACT EXTRACTION W/ INTRAOCULAR LENS  IMPLANT, BILATERAL  2014   CESAREAN SECTION  1984, 1987, 1994   X3   COLONOSCOPY  11/16/2013   w/Brodie    FOOT SURGERY Left 2002   [place] Right 2007   TUBAL LIGATION  1994    There were no vitals filed for this visit.   Subjective Assessment - 11/08/21 1253     Subjective Patient reports she is doing ok today. No pain currently and has been completing her new exercises.    Currently in Pain? No/denies               [facility] Adult PT Treatment:                                                DATE: 11/08/21 Therapeutic Exercise: Seated calf raise 2 x 10  Seated toe raises 2 x 10  Seated marble pickups LLE 2 x 10 marbles  Manual Therapy: Lt ankle PROM to tolerance all planes Lt metatarsal glides A/P Gentle ankle distraction Lt  Gait training: Single axillary crutch and CAM boot- mild pain about lateral ankle  x 20 ft  No crutches and CAM boot- mild pain about lateral ankle x 20 ft Axillary crutches and tennis shoe x 75 ft; step to pattern                             PT Short Term Goals - 11/07/21 1032       PT SHORT TERM GOAL #1   Title Pt will report understanding and adherence to her HEP in order to promote independence in the management of her primary impairments.    Baseline issued at eval.    Time 4    Period Weeks    Status Achieved    Target Date 09/27/21      PT SHORT TERM GOAL #2   Title Pt will demonstrate ability to tolerate lateral weightshifting in standing in order to transition to walking with less limitation.    Baseline able  to weightshift in standing with tennis shoe with mild pain. 11/07/21    Time 4    Period Weeks    Status Achieved               PT Long Term Goals - 11/07/21 1032       PT LONG TERM GOAL #1   Title Pt will achieve a FOTO score of 55% or higher in order to demonstrate improved functional ability as it relates to her L ankle pain.    Baseline see flowsheet    Time 8    Period Weeks    Status On-going    Target Date 10/25/21      PT LONG TERM GOAL #2   Title Pt will demonstrate the ability to perform a 6-minute walk test without CAM boot or AD at self-selected pace in order to progress to safe community ambulation.    Baseline currently WBAT in CAM boot and crutches    Time 8    Period Weeks    Status Unable to assess    Target Date 10/25/21      PT LONG TERM GOAL #3   Title Pt will demonstrate Lt ankle inversion/eversion AROM WNL with 0-2/10 pain in order to put on her socks and shoes without limitation.    Baseline See flowsheet    Time 8    Period Weeks    Status Revised    Target Date 10/25/21      PT LONG TERM GOAL #4   Title Pt will demonstrate BIL global hip strength of 5/5 in order to independently progress her LE strengthening regimen without limitation.    Baseline See flowsheet    Period Weeks     Status Partially Met    Target Date 10/25/21      PT LONG TERM GOAL #5   Title Patient will demonstrate 5/5 Lt ankle strength to improve stability necessary for community ambulation.    Baseline see flowsheet    Time 4    Period Weeks    Status New    Target Date 12/05/21      Additional Long Term Goals   Additional Long Term Goals Yes      PT LONG TERM GOAL #6   Title Patient will maintain SLS on the LLE for at least 5 seconds to improve stability when walking on uneven terrain.    Baseline unable    Time 8    Period Weeks    Status New    Target Date 01/02/22                   Plan - 11/08/21 1304     Clinical Impression Statement Focused on progressing weightbearing today with patient able to ambulate with crutches and tennis shoe utilizing step to pattern without reports of pain. With CAM boot and single or no axillary crutch this causes lateral ankle pain and was discontinued. At this time it was recommended that she can begin WBAT with crutches and her regular shoe for household ambulation and continue WBAT in CAM boot and crutches for community ambulation. Able to progress ankle ROM and foot intrinsic strengthening without reports of pain.    Personal Factors and Comorbidities Comorbidity 3+;Past/Current Experience    Comorbidities OA, HTN, anxety, multiple ankle injuries/ surgeries    Rehab Potential Good    PT Frequency 2x / week    PT Duration 8 weeks    PT Treatment/Interventions ADLs/Self Care Home Management;Cryotherapy;Electrical Stimulation;Moist  Heat;Gait training;Therapeutic activities;Therapeutic exercise;Balance training;Neuromuscular re-education;Patient/family education;Manual techniques;Passive range of motion;Dry needling;Taping;Vasopneumatic Device;Stair training;Aquatic Therapy;Iontophoresis 71m/ml Dexamethasone;Ultrasound    PT Next Visit Plan progress WBAT, progress ankle ROM and strengthening as tolerated.    PT Home Exercise Plan Access Code  HBeeand Agree with Plan of Care Patient             Patient will benefit from skilled therapeutic intervention in order to improve the following deficits and impairments:  Abnormal gait, Decreased range of motion, Difficulty walking, Decreased endurance, Decreased activity tolerance, Pain, Decreased balance, Impaired flexibility, Decreased strength, Increased edema  Visit Diagnosis: Pain in left foot  Difficulty in walking, not elsewhere classified  Stiffness of left ankle, not elsewhere classified  Muscle weakness (generalized)     Problem List Patient Active Problem List   Diagnosis Date Noted   Allergic rhinitis 03/15/2021   Allergic rhinitis due to animal (cat) (dog) hair and dander 03/15/2021   Allergic rhinitis due to pollen 03/15/2021   Gastro-esophageal reflux disease without esophagitis 03/15/2021   Mild persistent asthma, uncomplicated 092/09/9416  Aortic atherosclerosis ([place] on 02/02/2018  01/10/2021   Asthma    Low back pain 03/17/2019   Lumbosacral spondylosis without myelopathy 03/17/2019   Cubital tunnel syndrome 10/14/2018   Closed fracture of distal end of right radius 07/09/2018   Pseudogout 12/09/2017   Major depressive disorder, recurrent, severe without psychotic features ([place]    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   [place] drug [place] ([place]    Medication management 08/22/2014   Vitamin D deficiency 08/22/2014   Hyperlipidemia, mixed 08/22/2014   Hypertension    Anxiety    Abnormal glucose    Class 2 severe obesity due to excess calories with serious comorbidity and body mass index (BMI) of 37.0 to 37.9 in adult ([facility]    SGwendolyn Grant PT, DPT, ATC 11/08/21 2:02 PM   [facility] [address]Phone: [phone]  Fax:  [phone] Name: Haley GARCIAGARCIAMRN: [id number] of Birth: 330-Dec-1957

## 2021-11-08 NOTE — Progress Notes (Signed)
Subjective: Haley White is a 66 y.o. is seen today in office s/p left peroneal tendon repair, anastomosis subsequently she had a fall and had a fracture posterior calcaneus.  She has gone to physical therapy some but her insurance has changed and she has not been in the last couple weeks but she is scheduled to go back.  She states that she does get swelling and some increased pain when she is on her foot more becoming more active.  It does seem to fluctuate where it swells and she is on her foot more but it goes down when she is off of her foot.  No new injuries or concerns otherwise.   Objective: General: No acute distress, AAOx3  DP/PT pulses palpable 2/4, CRT < 3 sec to all digits.  Protective sensation intact. Motor function intact.  Left foot: Incision site along the peroneal tendon has healed with a scar.  Slight tenderness along the course the peroneal tendons today.  Mild discomfort on the calcaneus.  Clinically the Achilles tendon appears to be intact.  Flexor, extensor tendons intact.   No pain with calf compression, swelling, warmth, erythema.   MRI 09/08/2021 IMPRESSION: 1. Insufficiency fracture of the calcaneal tuberosity superiorly, near the Achilles insertion, as can be seen associated with diabetes. No displaced fracture or Achilles tendon tear identified. 2. Postsurgical changes related to repair of the previously demonstrated longitudinal split tear of the peroneus brevis tendon. No new tendon findings. 3. Stable subchondral cysts in the lateral talar dome and midfoot degenerative changes. No other acute osseous findings.  Assessment and Plan:  Status post left peroneal tendon anastomosis; calcaneal fracture  -Treatment options discussed including all alternatives, risks, and complications -At this point she is still in the cam boot.  Discussed gradual increase in activity level but wait for physical therapy.  We discussed possibly aquatic therapy if this is a possibility  for her as well.  She still on pain medicine she is taking Tylenol and gabapentin.  She is trying to wean off of the narcotics otherwise.  Continue to ice and elevate.  Trula Slade DPM

## 2021-11-13 ENCOUNTER — Ambulatory Visit: Payer: Managed Care, Other (non HMO)

## 2021-11-13 ENCOUNTER — Other Ambulatory Visit: Payer: Self-pay

## 2021-11-13 DIAGNOSIS — M25672 Stiffness of left ankle, not elsewhere classified: Secondary | ICD-10-CM

## 2021-11-13 DIAGNOSIS — M79672 Pain in left foot: Secondary | ICD-10-CM

## 2021-11-13 DIAGNOSIS — M6281 Muscle weakness (generalized): Secondary | ICD-10-CM

## 2021-11-13 DIAGNOSIS — R262 Difficulty in walking, not elsewhere classified: Secondary | ICD-10-CM

## 2021-11-13 NOTE — Therapy (Signed)
[place], [place], [address] Phone: [phone]   Fax:  [phone]  Physical Therapy Treatment  Patient Details  Name: Haley White MRN: [id number] Date of Birth: December 26, 1955 Referring Provider (PT): Trula Slade, [place]   Encounter Date: 11/13/2021   PT End of Session - 11/13/21 1102     Visit Number 8    Number of Visits 22    Date for PT Re-Evaluation 01/05/22    Authorization Type [employer]    PT Start Time 1102    PT Stop Time 1150    PT Time Calculation (min) 48 min    Equipment Utilized During Treatment Other (comment)   axillary crutches   Activity Tolerance Patient tolerated treatment well    Behavior During Therapy [facility] for tasks assessed/performed             Past Medical History:  Diagnosis Date   Anxiety    Arthritis    Asthma    Cataract    Gout    Hyperlipidemia    Hypertension    Obese     Past Surgical History:  Procedure Laterality Date   APPENDECTOMY  1984   CATARACT EXTRACTION W/ INTRAOCULAR LENS  IMPLANT, BILATERAL  2014   CESAREAN SECTION  1984, 1987, 1994   X3   COLONOSCOPY  11/16/2013   w/Brodie    FOOT SURGERY Left 2002   Marcus Right 2007   TUBAL LIGATION  1994    There were no vitals filed for this visit.   Subjective Assessment - 11/13/21 1103     Subjective Patient reports she is doing good today. No pain. She has been walking around her home using her tennis shoe and crutches. She reports her knees have been bothering her lately even waking her up at night.    Currently in Pain? No/denies              [facility] Adult PT Treatment:                                                DATE: 11/13/21 Therapeutic Exercise: Resisted ankle inversion, plantarflexion ,and dorsiflexion yellow band 1 x 10 each; attempted eversion discontinued due to pain Eversion isometric x 5 IT band, hamstring, and quad stretch with strap  30 sec each LLE Updated HEP Manual Therapy: STM to Lt peroneals, bicep femoris, IT band, gastroc Lt ankle PROM to tolerance   Gait training: (tennis shoe donned)  Gait training with 2 axillary crutches focusing on push-off Gait training with single axillary crutch with step to pattern                              PT Short Term Goals - 11/07/21 1032       PT SHORT TERM GOAL #1   Title Pt will report understanding and adherence to her HEP in order to promote independence in the management of her primary impairments.    Baseline issued at eval.    Time 4    Period Weeks    Status Achieved    Target Date 09/27/21      PT SHORT TERM GOAL #2   Title Pt will demonstrate ability to tolerate lateral weightshifting in standing  in order to transition to walking with less limitation.    Baseline able to weightshift in standing with tennis shoe with mild pain. 11/07/21    Time 4    Period Weeks    Status Achieved               PT Long Term Goals - 11/07/21 1032       PT LONG TERM GOAL #1   Title Pt will achieve a FOTO score of 55% or higher in order to demonstrate improved functional ability as it relates to her L ankle pain.    Baseline see flowsheet    Time 8    Period Weeks    Status On-going    Target Date 10/25/21      PT LONG TERM GOAL #2   Title Pt will demonstrate the ability to perform a 6-minute walk test without CAM boot or AD at self-selected pace in order to progress to safe community ambulation.    Baseline currently WBAT in CAM boot and crutches    Time 8    Period Weeks    Status Unable to assess    Target Date 10/25/21      PT LONG TERM GOAL #3   Title Pt will demonstrate Lt ankle inversion/eversion AROM WNL with 0-2/10 pain in order to put on her socks and shoes without limitation.    Baseline See flowsheet    Time 8    Period Weeks    Status Revised    Target Date 10/25/21      PT LONG TERM GOAL #4   Title Pt will  demonstrate BIL global hip strength of 5/5 in order to independently progress her LE strengthening regimen without limitation.    Baseline See flowsheet    Period Weeks    Status Partially Met    Target Date 10/25/21      PT LONG TERM GOAL #5   Title Patient will demonstrate 5/5 Lt ankle strength to improve stability necessary for community ambulation.    Baseline see flowsheet    Time 4    Period Weeks    Status New    Target Date 12/05/21      Additional Long Term Goals   Additional Long Term Goals Yes      PT LONG TERM GOAL #6   Title Patient will maintain SLS on the LLE for at least 5 seconds to improve stability when walking on uneven terrain.    Baseline unable    Time 8    Period Weeks    Status New    Target Date 01/02/22                   Plan - 11/13/21 1138     Clinical Impression Statement Able to progress gait training to single axillary crutch today, which she tolerates well utilizing step to gait pattern at this time. When she attempts step-through she reports mild lateral ankle discomfort during push-off phase on the LLE. It was recommended at this time that she begin using single axillary crutch and step to pattern for household ambulation. Able to begin resisted ankle strengthening today into dorsiflexion, plantarflexion, and inversion without reports of pain, though unable to complete into eversion as this causes mild lateral ankle pain at this time. Able to tolerate eversion isometrics without pain. She is noted to have multiple trigger points throughout peroneals and gastroc and will potentially benefit from TPDN at future sessions. No reports of pain  at conclusion of session.    Personal Factors and Comorbidities Comorbidity 3+;Past/Current Experience    Comorbidities OA, HTN, anxety, multiple ankle injuries/ surgeries    Rehab Potential Good    PT Frequency --    PT Duration --    PT Treatment/Interventions ADLs/Self Care Home  Management;Cryotherapy;Software engineer;Therapeutic activities;Therapeutic exercise;Balance training;Neuromuscular re-education;Patient/family education;Manual techniques;Passive range of motion;Dry needling;Taping;Vasopneumatic Device;Stair training;Aquatic Therapy;Iontophoresis 53m/ml Dexamethasone;Ultrasound    PT Next Visit Plan progress WBAT, progress ankle ROM and strengthening as tolerated.    PT Home Exercise Plan Access Code [email or web address]    Consulted and Agree with Plan of Care Patient             Patient will benefit from skilled therapeutic intervention in order to improve the following deficits and impairments:  Abnormal gait, Decreased range of motion, Difficulty walking, Decreased endurance, Decreased activity tolerance, Pain, Decreased balance, Impaired flexibility, Decreased strength, Increased edema  Visit Diagnosis: Pain in left foot  Difficulty in walking, not elsewhere classified  Stiffness of left ankle, not elsewhere classified  Muscle weakness (generalized)     Problem List Patient Active Problem List   Diagnosis Date Noted   Allergic rhinitis 03/15/2021   Allergic rhinitis due to animal (cat) (dog) hair and dander 03/15/2021   Allergic rhinitis due to pollen 03/15/2021   Gastro-esophageal reflux disease without esophagitis 03/15/2021   Mild persistent asthma, uncomplicated 094/49/6759  Aortic atherosclerosis ([place] by Chest CT scan on 02/02/2018  01/10/2021   Asthma    Low back pain 03/17/2019   Lumbosacral spondylosis without myelopathy 03/17/2019   Cubital tunnel syndrome 10/14/2018   Closed fracture of distal end of right radius 07/09/2018   Pseudogout 12/09/2017   Major depressive disorder, recurrent, severe without psychotic features ([place]    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   Suicide attempt by drug ingestion ([place]    Medication management 08/22/2014   Vitamin D deficiency 08/22/2014   Hyperlipidemia, mixed  08/22/2014   Hypertension    Anxiety    Abnormal glucose    Class 2 severe obesity due to excess calories with serious comorbidity and body mass index (BMI) of 37.0 to 37.9 in adult ([facility]    SGwendolyn Grant PT, DPT, ATC 11/13/21 12:02 PM   [facility] [address]Phone: [phone]  Fax:  [phone] Name: MIZETTA SAKAMOTOMRN: [id number] of Birth: 3Apr 24, 1957

## 2021-11-15 ENCOUNTER — Ambulatory Visit: Payer: Managed Care, Other (non HMO)

## 2021-11-15 ENCOUNTER — Other Ambulatory Visit: Payer: Self-pay

## 2021-11-15 ENCOUNTER — Ambulatory Visit: Payer: Managed Care, Other (non HMO) | Admitting: Physical Therapy

## 2021-11-15 DIAGNOSIS — M79672 Pain in left foot: Secondary | ICD-10-CM

## 2021-11-15 DIAGNOSIS — R262 Difficulty in walking, not elsewhere classified: Secondary | ICD-10-CM

## 2021-11-15 DIAGNOSIS — M6281 Muscle weakness (generalized): Secondary | ICD-10-CM

## 2021-11-15 DIAGNOSIS — M25672 Stiffness of left ankle, not elsewhere classified: Secondary | ICD-10-CM

## 2021-11-15 NOTE — Therapy (Signed)
[place], [place], [address] Phone: [phone]   Fax:  [phone]  Physical Therapy Treatment  Patient Details  Name: Haley White MRN: [id number] Date of Birth: 08/08/1956 Referring Provider (PT): Trula Slade, [place]   Encounter Date: 11/15/2021   PT End of Session - 11/15/21 1700     Visit Number 9    Number of Visits 22    Date for PT Re-Evaluation 01/05/22    Authorization Type [employer]    PT Start Time 1700    PT Stop Time 6962    PT Time Calculation (min) 45 min    Equipment Utilized During Treatment --    Activity Tolerance Patient tolerated treatment well    Behavior During Therapy [facility] for tasks assessed/performed             Past Medical History:  Diagnosis Date   Anxiety    Arthritis    Asthma    Cataract    Gout    Hyperlipidemia    Hypertension    Obese     Past Surgical History:  Procedure Laterality Date   APPENDECTOMY  1984   CATARACT EXTRACTION W/ INTRAOCULAR LENS  IMPLANT, BILATERAL  2014   CESAREAN SECTION  1984, 1987, 1994   X3   COLONOSCOPY  11/16/2013   w/Brodie    FOOT SURGERY Left 2002   Cochise Right 2007   TUBAL LIGATION  1994    There were no vitals filed for this visit.   Subjective Assessment - 11/15/21 1703     Subjective Patient reports shooting pain along the back and the side of the leg that she began to notice when she was sitting at the movies today.    Currently in Pain? Yes    Pain Score 4     Pain Location Leg    Pain Orientation Left;Lower;Posterior;Lateral    Pain Descriptors / Indicators Sharp    Pain Type Chronic pain    Pain Onset More than a month ago    Pain Frequency Intermittent    Aggravating Factors  propping the foot up    Pain Relieving Factors "rubbing it"               [facility] Adult PT Treatment:                                                DATE: 11/15/21 Therapeutic  Exercise: Ankle rockerboard A/P and lateral x 20 each  Seated calf raise LLE 2 x 10; 10 lbs  Lateral weight shifts 1 x 10  Forward/backward weight shifts 1 x 10  Manual Therapy: DTM to Lt gastroc/soleus and peroneals  Trigger Point Dry Needling Treatment: Pre-treatment instruction: Patient instructed on dry needling rationale, procedures, and possible side effects including pain during treatment (achy,cramping feeling), bruising, drop of blood, lightheadedness, nausea, sweating. Patient Consent Given: Yes Education handout provided: Previously provided Muscles treated: Lt gastroc/soleus and peroneal longus  Treatment response/outcome: Twitch response elicited and Palpable decrease in muscle tension Post-treatment instructions: Patient instructed to expect possible mild to moderate muscle soreness later today and/or tomorrow. Patient instructed in methods to reduce muscle soreness and to continue prescribed HEP. If patient was dry needled over the lung field, patient was instructed on signs and symptoms of  pneumothorax and, however unlikely, to see immediate medical attention should they occur. Patient was also educated on signs and symptoms of infection and to seek medical attention should they occur. Patient verbalized understanding of these instructions and education.                            PT Short Term Goals - 11/07/21 1032       PT SHORT TERM GOAL #1   Title Pt will report understanding and adherence to her HEP in order to promote independence in the management of her primary impairments.    Baseline issued at eval.    Time 4    Period Weeks    Status Achieved    Target Date 09/27/21      PT SHORT TERM GOAL #2   Title Pt will demonstrate ability to tolerate lateral weightshifting in standing in order to transition to walking with less limitation.    Baseline able to weightshift in standing with tennis shoe with mild pain. 11/07/21    Time 4    Period  Weeks    Status Achieved               PT Long Term Goals - 11/07/21 1032       PT LONG TERM GOAL #1   Title Pt will achieve a FOTO score of 55% or higher in order to demonstrate improved functional ability as it relates to her L ankle pain.    Baseline see flowsheet    Time 8    Period Weeks    Status On-going    Target Date 10/25/21      PT LONG TERM GOAL #2   Title Pt will demonstrate the ability to perform a 6-minute walk test without CAM boot or AD at self-selected pace in order to progress to safe community ambulation.    Baseline currently WBAT in CAM boot and crutches    Time 8    Period Weeks    Status Unable to assess    Target Date 10/25/21      PT LONG TERM GOAL #3   Title Pt will demonstrate Lt ankle inversion/eversion AROM WNL with 0-2/10 pain in order to put on her socks and shoes without limitation.    Baseline See flowsheet    Time 8    Period Weeks    Status Revised    Target Date 10/25/21      PT LONG TERM GOAL #4   Title Pt will demonstrate BIL global hip strength of 5/5 in order to independently progress her LE strengthening regimen without limitation.    Baseline See flowsheet    Period Weeks    Status Partially Met    Target Date 10/25/21      PT LONG TERM GOAL #5   Title Patient will demonstrate 5/5 Lt ankle strength to improve stability necessary for community ambulation.    Baseline see flowsheet    Time 4    Period Weeks    Status New    Target Date 12/05/21      Additional Long Term Goals   Additional Long Term Goals Yes      PT LONG TERM GOAL #6   Title Patient will maintain SLS on the LLE for at least 5 seconds to improve stability when walking on uneven terrain.    Baseline unable    Time 8    Period Weeks    Status  New    Target Date 01/02/22                   Plan - 11/15/21 1728     Clinical Impression Statement TPDN performed to gastroc/soleus and peroneal longus with twitch response and decreased tautness  noted following intervention. Able to progression ankle ROM and strengthening today, which overall she tolerated well. With ROM she reports occasional pain at end range of all planes that quickly subsides when she returns to neutral positioning. She does well with weight shifting onto the LLE in the sagittal and frontal plane, though has tendency to maintain Lt foot in ER.    Personal Factors and Comorbidities Comorbidity 3+;Past/Current Experience    Comorbidities OA, HTN, anxety, multiple ankle injuries/ surgeries    Rehab Potential Good    PT Treatment/Interventions ADLs/Self Care Home Management;Cryotherapy;Software engineer;Therapeutic activities;Therapeutic exercise;Balance training;Neuromuscular re-education;Patient/family education;Manual techniques;Passive range of motion;Dry needling;Taping;Vasopneumatic Device;Stair training;Aquatic Therapy;Iontophoresis 96m/ml Dexamethasone;Ultrasound    PT Next Visit Plan progress WBAT, progress ankle ROM and strengthening as tolerated.    PT Home Exercise Plan Access Code [email or web address]    Consulted and Agree with Plan of Care Patient             Patient will benefit from skilled therapeutic intervention in order to improve the following deficits and impairments:  Abnormal gait, Decreased range of motion, Difficulty walking, Decreased endurance, Decreased activity tolerance, Pain, Decreased balance, Impaired flexibility, Decreased strength, Increased edema  Visit Diagnosis: Pain in left foot  Difficulty in walking, not elsewhere classified  Stiffness of left ankle, not elsewhere classified  Muscle weakness (generalized)     Problem List Patient Active Problem List   Diagnosis Date Noted   Allergic rhinitis 03/15/2021   Allergic rhinitis due to animal (cat) (dog) hair and dander 03/15/2021   Allergic rhinitis due to pollen 03/15/2021   Gastro-esophageal reflux disease without esophagitis 03/15/2021   Mild  persistent asthma, uncomplicated 018/29/9371  Aortic atherosclerosis ([place] by Chest CT scan on 02/02/2018  01/10/2021   Asthma    Low back pain 03/17/2019   Lumbosacral spondylosis without myelopathy 03/17/2019   Cubital tunnel syndrome 10/14/2018   Closed fracture of distal end of right radius 07/09/2018   Pseudogout 12/09/2017   Major depressive disorder, recurrent, severe without psychotic features ([place]    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   Suicide attempt by drug ingestion ([place]    Medication management 08/22/2014   Vitamin D deficiency 08/22/2014   Hyperlipidemia, mixed 08/22/2014   Hypertension    Anxiety    Abnormal glucose    Class 2 severe obesity due to excess calories with serious comorbidity and body mass index (BMI) of 37.0 to 37.9 in adult ([facility]   SGwendolyn Grant PT, DPT, ATC 11/15/21 5:46 PM   C[facility]verse[facility] [address]Phone: [phone]  Fax:  [phone] Name: MMENDY CHOUMRN: [id number] of Birth: [birth year]

## 2021-11-20 ENCOUNTER — Ambulatory Visit (HOSPITAL_BASED_OUTPATIENT_CLINIC_OR_DEPARTMENT_OTHER): Payer: Managed Care, Other (non HMO) | Admitting: Physical Therapy

## 2021-11-21 ENCOUNTER — Encounter: Payer: Self-pay | Admitting: Internal Medicine

## 2021-11-22 ENCOUNTER — Other Ambulatory Visit: Payer: Self-pay

## 2021-11-22 ENCOUNTER — Ambulatory Visit: Payer: Managed Care, Other (non HMO)

## 2021-11-22 DIAGNOSIS — M79672 Pain in left foot: Secondary | ICD-10-CM | POA: Diagnosis not present

## 2021-11-22 DIAGNOSIS — M6281 Muscle weakness (generalized): Secondary | ICD-10-CM

## 2021-11-22 DIAGNOSIS — R262 Difficulty in walking, not elsewhere classified: Secondary | ICD-10-CM

## 2021-11-22 DIAGNOSIS — M25672 Stiffness of left ankle, not elsewhere classified: Secondary | ICD-10-CM

## 2021-11-22 NOTE — Therapy (Signed)
[place], [place], [address] Phone: [phone]   Fax:  [phone]  Physical Therapy Treatment  Patient Details  Name: Haley White MRN: [id number] Date of Birth: [birth year] Referring Provider (PT): Trula Slade, [place]   Encounter Date: 11/22/2021   PT End of Session - 11/22/21 1101     Visit Number 10    Number of Visits 22    Date for PT Re-Evaluation 01/05/22    Authorization Type [employer]ign[employer]    PT Start Time 1101    PT Stop Time 3953    PT Time Calculation (min) 44 min    Activity Tolerance Patient tolerated treatment well    Behavior During Therapy [facility] for tasks assessed/performed             Past Medical History:  Diagnosis Date   Anxiety    Arthritis    Asthma    Cataract    Gout    Hyperlipidemia    Hypertension    Obese     Past Surgical History:  Procedure Laterality Date   APPENDECTOMY  1984   CATARACT EXTRACTION W/ INTRAOCULAR LENS  IMPLANT, BILATERAL  2014   CESAREAN SECTION  1984, 1987, 1994   X3   COLONOSCOPY  11/16/2013   w/Brodie    FOOT SURGERY Left 2002   KNEE SURGERY Left 1998   ROTATOR CUFF REPAIR Right 2007   TUBAL LIGATION  1994    There were no vitals filed for this visit.   Subjective Assessment - 11/22/21 1102     Subjective Patient reports the foot is feeling better and is only experiencing occasional shooting pain along the lower leg mostly noted if she has been on her feet too long. She has been utlizing crutches and tennis shoe for household ambulation and uses CAM boot for prolonged community ambulation.    Currently in Pain? No/denies             [facility] Adult PT Treatment:                                                DATE: 11/22/21 Therapeutic Exercise: NuStep level 4 x 5 minutes  Ankle rockerboard A/P x 20 reps focusing on great toe activation.  Towel scrunches x 20 reps  Resisted ankle PF/DF and inversion red band 1x 10  Resisted ankle  eversion yellow band 1 x 10  Reviewed and updated HEP Gait training: Single axillary crutch focusing on heel strike and pronation during stance phase on the LLE with step to pattern                               PT Short Term Goals - 11/07/21 1032       PT SHORT TERM GOAL #1   Title Pt will report understanding and adherence to her HEP in order to promote independence in the management of her primary impairments.    Baseline issued at eval.    Time 4    Period Weeks    Status Achieved    Target Date 09/27/21      PT SHORT TERM GOAL #2   Title Pt will demonstrate ability to tolerate lateral weightshifting in standing in order to transition to walking with less limitation.  Baseline able to weightshift in standing with tennis shoe with mild pain. 11/07/21    Time 4    Period Weeks    Status Achieved               PT Long Term Goals - 11/07/21 1032       PT LONG TERM GOAL #1   Title Pt will achieve a FOTO score of 55% or higher in order to demonstrate improved functional ability as it relates to her L ankle pain.    Baseline see flowsheet    Time 8    Period Weeks    Status On-going    Target Date 10/25/21      PT LONG TERM GOAL #2   Title Pt will demonstrate the ability to perform a 6-minute walk test without CAM boot or AD at self-selected pace in order to progress to safe community ambulation.    Baseline currently WBAT in CAM boot and crutches    Time 8    Period Weeks    Status Unable to assess    Target Date 10/25/21      PT LONG TERM GOAL #3   Title Pt will demonstrate Lt ankle inversion/eversion AROM WNL with 0-2/10 pain in order to put on her socks and shoes without limitation.    Baseline See flowsheet    Time 8    Period Weeks    Status Revised    Target Date 10/25/21      PT LONG TERM GOAL #4   Title Pt will demonstrate BIL global hip strength of 5/5 in order to independently progress her LE strengthening regimen without  limitation.    Baseline See flowsheet    Period Weeks    Status Partially Met    Target Date 10/25/21      PT LONG TERM GOAL #5   Title Patient will demonstrate 5/5 Lt ankle strength to improve stability necessary for community ambulation.    Baseline see flowsheet    Time 4    Period Weeks    Status New    Target Date 12/05/21      Additional Long Term Goals   Additional Long Term Goals Yes      PT LONG TERM GOAL #6   Title Patient will maintain SLS on the LLE for at least 5 seconds to improve stability when walking on uneven terrain.    Baseline unable    Time 8    Period Weeks    Status New    Target Date 01/02/22                   Plan - 11/22/21 1122     Clinical Impression Statement Patient arrives without reports of pain. Gait training focused on improving mechanics during stance phase on the LLE as she has difficulty allowing for heel strike and pronation during midstance. With continued practice she is able to complete step to pattern with single axillary crutch and allow for pronation, though still unable to complete push-off secondary to pain.    Personal Factors and Comorbidities Comorbidity 3+;Past/Current Experience    Comorbidities OA, HTN, anxety, multiple ankle injuries/ surgeries    Rehab Potential Good    PT Treatment/Interventions ADLs/Self Care Home Management;Cryotherapy;Software engineer;Therapeutic activities;Therapeutic exercise;Balance training;Neuromuscular re-education;Patient/family education;Manual techniques;Passive range of motion;Dry needling;Taping;Vasopneumatic Device;Stair training;Aquatic Therapy;Iontophoresis 86m/ml Dexamethasone;Ultrasound    PT Next Visit Plan progress WBAT, progress ankle ROM and strengthening as tolerated.    PT Home Exercise  Plan Access Code Roland and Agree with Plan of Care Patient             Patient will benefit from skilled therapeutic intervention in  order to improve the following deficits and impairments:  Abnormal gait, Decreased range of motion, Difficulty walking, Decreased endurance, Decreased activity tolerance, Pain, Decreased balance, Impaired flexibility, Decreased strength, Increased edema  Visit Diagnosis: Pain in left foot  Difficulty in walking, not elsewhere classified  Stiffness of left ankle, not elsewhere classified  Muscle weakness (generalized)     Problem List Patient Active Problem List   Diagnosis Date Noted   Allergic rhinitis 03/15/2021   Allergic rhinitis due to animal (cat) (dog) hair and dander 03/15/2021   Allergic rhinitis due to pollen 03/15/2021   Gastro-esophageal reflux disease without esophagitis 03/15/2021   Mild persistent asthma, uncomplicated 68/61/6837   Aortic atherosclerosis ([place]) by Chest CT scan on 02/02/2018  01/10/2021   Asthma    Low back pain 03/17/2019   Lumbosacral spondylosis without myelopathy 03/17/2019   Cubital tunnel syndrome 10/14/2018   Closed fracture of distal end of right radius 07/09/2018   Pseudogout 12/09/2017   Major depressive disorder, recurrent, severe without psychotic features ([place])    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 [place], [place], [address] Phone: [phone]   Fax:  [phone]  Name: Haley White MRN: [id number] Date of Birth: 1956/10/13

## 2021-11-27 ENCOUNTER — Ambulatory Visit: Payer: Managed Care, Other (non HMO) | Admitting: Podiatry

## 2021-11-27 ENCOUNTER — Ambulatory Visit: Payer: Managed Care, Other (non HMO)

## 2021-11-27 ENCOUNTER — Other Ambulatory Visit: Payer: Self-pay

## 2021-11-27 DIAGNOSIS — M79672 Pain in left foot: Secondary | ICD-10-CM

## 2021-11-27 DIAGNOSIS — R262 Difficulty in walking, not elsewhere classified: Secondary | ICD-10-CM

## 2021-11-27 DIAGNOSIS — M6281 Muscle weakness (generalized): Secondary | ICD-10-CM

## 2021-11-27 DIAGNOSIS — S96919D Strain of unspecified muscle and tendon at ankle and foot level, unspecified foot, subsequent encounter: Secondary | ICD-10-CM | POA: Diagnosis not present

## 2021-11-27 DIAGNOSIS — M25672 Stiffness of left ankle, not elsewhere classified: Secondary | ICD-10-CM

## 2021-11-27 DIAGNOSIS — S92002D Unspecified fracture of left calcaneus, subsequent encounter for fracture with routine healing: Secondary | ICD-10-CM | POA: Diagnosis not present

## 2021-11-27 NOTE — Therapy (Signed)
[place], [place], [address] Phone: [phone]   Fax:  [phone]  Physical Therapy Treatment  Patient Details  Name: Haley White MRN: [id number] Date of Birth: 1955/12/27 Referring Provider (PT): Trula Slade, [place]   Encounter Date: 11/27/2021   PT End of Session - 11/27/21 1102     Visit Number 11    Number of Visits 22    Date for PT Re-Evaluation 01/05/22    Authorization Type [employer]    PT Start Time 1102    PT Stop Time 1145   3 minutes TPDN   PT Time Calculation (min) 43 min    Activity Tolerance Patient tolerated treatment well    Behavior During Therapy [facility] for tasks assessed/performed             Past Medical History:  Diagnosis Date   Anxiety    Arthritis    Asthma    Cataract    Gout    Hyperlipidemia    Hypertension    Obese     Past Surgical History:  Procedure Laterality Date   APPENDECTOMY  1984   CATARACT EXTRACTION W/ INTRAOCULAR LENS  IMPLANT, BILATERAL  2014   CESAREAN SECTION  1984, 1987, 1994   X3   COLONOSCOPY  11/16/2013   w/Brodie    FOOT SURGERY Left 2002   KNEE SURGERY Left 1998   ROTATOR CUFF REPAIR Right 2007   TUBAL LIGATION  1994    There were no vitals filed for this visit.   Subjective Assessment - 11/27/21 1108     Subjective Patient reports no pain currently, but over did it this weekend at her women's retreat that caused swelling/increased pain in the ankle, but this has since resolved. She had f/u with Dr. Jacqualyn Posey today who is pleased with her overall progress and encouraged further progression in PT. She has next f/u with him in 1 month.    Currently in Pain? No/denies                [facility] PT Assessment - 11/27/21 0001       AROM   Left Ankle Inversion 16    Left Ankle Eversion 15           [facility] Adult PT Treatment:                                                DATE: 11/27/21 Therapeutic Exercise: NuStep level 4 x 5  minutes, focusing on ankle mobility  Great toe extension 20 reps LLE Towel slide inversion and eversion 1 rep (increased lateral ankle pain) Ankle ABCs 1 set LLE  Manual Therapy: IASTM to Lt gastroc/soleus, peroneals, plantar foot  Trigger Point Dry Needling Treatment: Pre-treatment instruction: Patient instructed on dry needling rationale, procedures, and possible side effects including pain during treatment (achy,cramping feeling), bruising, drop of blood, lightheadedness, nausea, sweating. Patient Consent Given: Yes Education handout provided: Previously provided Muscles treated: Lt gastrocnemius/soleus   Treatment response/outcome: Twitch response elicited and Palpable decrease in muscle tension Post-treatment instructions: Patient instructed to expect possible mild to moderate muscle soreness later today and/or tomorrow. Patient instructed in methods to reduce muscle soreness and to continue prescribed HEP. If patient was dry needled over the lung field, patient was instructed on signs and symptoms of pneumothorax and, however unlikely, to  see immediate medical attention should they occur. Patient was also educated on signs and symptoms of infection and to seek medical attention should they occur. Patient verbalized understanding of these instructions and education.  NMR: romberg eyes closed 3 x 30 sec                            PT Short Term Goals - 11/07/21 1032       PT SHORT TERM GOAL #1   Title Pt will report understanding and adherence to her HEP in order to promote independence in the management of her primary impairments.    Baseline issued at eval.    Time 4    Period Weeks    Status Achieved    Target Date 09/27/21      PT SHORT TERM GOAL #2   Title Pt will demonstrate ability to tolerate lateral weightshifting in standing in order to transition to walking with less limitation.    Baseline able to weightshift in standing with tennis shoe with mild  pain. 11/07/21    Time 4    Period Weeks    Status Achieved               PT Long Term Goals - 11/07/21 1032       PT LONG TERM GOAL #1   Title Pt will achieve a FOTO score of 55% or higher in order to demonstrate improved functional ability as it relates to her L ankle pain.    Baseline see flowsheet    Time 8    Period Weeks    Status On-going    Target Date 10/25/21      PT LONG TERM GOAL #2   Title Pt will demonstrate the ability to perform a 6-minute walk test without CAM boot or AD at self-selected pace in order to progress to safe community ambulation.    Baseline currently WBAT in CAM boot and crutches    Time 8    Period Weeks    Status Unable to assess    Target Date 10/25/21      PT LONG TERM GOAL #3   Title Pt will demonstrate Lt ankle inversion/eversion AROM WNL with 0-2/10 pain in order to put on her socks and shoes without limitation.    Baseline See flowsheet    Time 8    Period Weeks    Status Revised    Target Date 10/25/21      PT LONG TERM GOAL #4   Title Pt will demonstrate BIL global hip strength of 5/5 in order to independently progress her LE strengthening regimen without limitation.    Baseline See flowsheet    Period Weeks    Status Partially Met    Target Date 10/25/21      PT LONG TERM GOAL #5   Title Patient will demonstrate 5/5 Lt ankle strength to improve stability necessary for community ambulation.    Baseline see flowsheet    Time 4    Period Weeks    Status New    Target Date 12/05/21      Additional Long Term Goals   Additional Long Term Goals Yes      PT LONG TERM GOAL #6   Title Patient will maintain SLS on the LLE for at least 5 seconds to improve stability when walking on uneven terrain.    Baseline unable    Time 8    Period Weeks  Status New    Target Date 01/02/22                   Plan - 11/27/21 1139     Clinical Impression Statement Patient continues to have significant tautness and palpable  tenderness about gastroc and peroneals. With TPDN patient reported referral into the Lt heel with notable twitch response elicited. Progressed foot intrinsic strengthening and progressed ROM activity with patient reporting difficulty coordinating movement of ABCs. Began static balance training with patient demonstrating good postural control and no LOB during romberg stance.    Personal Factors and Comorbidities Comorbidity 3+;Past/Current Experience    Comorbidities OA, HTN, anxety, multiple ankle injuries/ surgeries    Rehab Potential Good    PT Treatment/Interventions ADLs/Self Care Home Management;Cryotherapy;Software engineer;Therapeutic activities;Therapeutic exercise;Balance training;Neuromuscular re-education;Patient/family education;Manual techniques;Passive range of motion;Dry needling;Taping;Vasopneumatic Device;Stair training;Aquatic Therapy;Iontophoresis 19m/ml Dexamethasone;Ultrasound    PT Next Visit Plan progress WBAT, progress ankle ROM and strengthening as tolerated, balance.    PT Home Exercise Plan Access Code HHelperand Agree with Plan of Care Patient             Patient will benefit from skilled therapeutic intervention in order to improve the following deficits and impairments:  Abnormal gait, Decreased range of motion, Difficulty walking, Decreased endurance, Decreased activity tolerance, Pain, Decreased balance, Impaired flexibility, Decreased strength, Increased edema  Visit Diagnosis: Pain in left foot  Difficulty in walking, not elsewhere classified  Stiffness of left ankle, not elsewhere classified  Muscle weakness (generalized)     Problem List Patient Active Problem List   Diagnosis Date Noted   Allergic rhinitis 03/15/2021   Allergic rhinitis due to animal (cat) (dog) hair and dander 03/15/2021   Allergic rhinitis due to pollen 03/15/2021   Gastro-esophageal reflux disease without esophagitis 03/15/2021    Mild persistent asthma, uncomplicated 035/32/9924  Aortic atherosclerosis ([place] by Chest CT scan on 02/02/2018  01/10/2021   Asthma    Low back pain 03/17/2019   Lumbosacral spondylosis without myelopathy 03/17/2019   Cubital tunnel syndrome 10/14/2018   Closed fracture of distal end of right radius 07/09/2018   Pseudogout 12/09/2017   Major depressive disorder, recurrent, severe without psychotic features ([place]    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

## 2021-11-29 ENCOUNTER — Other Ambulatory Visit: Payer: Self-pay

## 2021-11-29 ENCOUNTER — Ambulatory Visit: Payer: Managed Care, Other (non HMO)

## 2021-11-29 DIAGNOSIS — M79672 Pain in left foot: Secondary | ICD-10-CM

## 2021-11-29 DIAGNOSIS — M6281 Muscle weakness (generalized): Secondary | ICD-10-CM

## 2021-11-29 DIAGNOSIS — M25672 Stiffness of left ankle, not elsewhere classified: Secondary | ICD-10-CM

## 2021-11-29 DIAGNOSIS — R262 Difficulty in walking, not elsewhere classified: Secondary | ICD-10-CM

## 2021-11-29 NOTE — Therapy (Signed)
[place], [place], [address] Phone: [phone]   Fax:  [phone]  Physical Therapy Treatment  Patient Details  Name: Haley White MRN: [id number] Date of Birth: 03-16-1956 Referring Provider (PT): Trula Slade, [place]   Encounter Date: 11/29/2021   PT End of Session - 11/29/21 1100     Visit Number 12    Number of Visits 22    Date for PT Re-Evaluation 01/05/22    Authorization Type [employer]    PT Start Time 1100    PT Stop Time 1143    PT Time Calculation (min) 43 min    Activity Tolerance Patient tolerated treatment well    Behavior During Therapy [facility] for tasks assessed/performed             Past Medical History:  Diagnosis Date   Anxiety    Arthritis    Asthma    Cataract    Gout    Hyperlipidemia    Hypertension    Obese     Past Surgical History:  Procedure Laterality Date   APPENDECTOMY  1984   CATARACT EXTRACTION W/ INTRAOCULAR LENS  IMPLANT, BILATERAL  2014   CESAREAN SECTION  1984, 1987, 1994   X3   COLONOSCOPY  11/16/2013   w/Brodie    FOOT SURGERY Left 2002   KNEE SURGERY Left 1998   ROTATOR CUFF REPAIR Right 2007   TUBAL LIGATION  1994    There were no vitals filed for this visit.   Subjective Assessment - 11/29/21 1105     Subjective Patient reports no pain currently. She has tried walking around the house without crutches for short distances occasionally, which has been ok. She reports resisted ankle inversion/eversion have been painful as part of her HEP.    Currently in Pain? No/denies               [facility] Adult PT Treatment:                                                DATE: 11/29/21 Therapeutic Exercise: Calf raises in // bars 2 x 10  Nustep level 4 x 5 minutes for ankle mobility  Gait Training: In parallel bars: focusing on step to pattern first with foot flat stance on the LLE, progressed to partial step through pattern with foot flat stance on  the LLE, progressed to step through pattern with minimal push-off on the LLE, progressed to step through pattern with push-off on the LLE in sync with heel strike on the RLE.                             PT Short Term Goals - 11/07/21 1032       PT SHORT TERM GOAL #1   Title Pt will report understanding and adherence to her HEP in order to promote independence in the management of her primary impairments.    Baseline issued at eval.    Time 4    Period Weeks    Status Achieved    Target Date 09/27/21      PT SHORT TERM GOAL #2   Title Pt will demonstrate ability to tolerate lateral weightshifting in standing in order to transition to walking with less limitation.    Baseline able  to weightshift in standing with tennis shoe with mild pain. 11/07/21    Time 4    Period Weeks    Status Achieved               PT Long Term Goals - 11/07/21 1032       PT LONG TERM GOAL #1   Title Pt will achieve a FOTO score of 55% or higher in order to demonstrate improved functional ability as it relates to her L ankle pain.    Baseline see flowsheet    Time 8    Period Weeks    Status On-going    Target Date 10/25/21      PT LONG TERM GOAL #2   Title Pt will demonstrate the ability to perform a 6-minute walk test without CAM boot or AD at self-selected pace in order to progress to safe community ambulation.    Baseline currently WBAT in CAM boot and crutches    Time 8    Period Weeks    Status Unable to assess    Target Date 10/25/21      PT LONG TERM GOAL #3   Title Pt will demonstrate Lt ankle inversion/eversion AROM WNL with 0-2/10 pain in order to put on her socks and shoes without limitation.    Baseline See flowsheet    Time 8    Period Weeks    Status Revised    Target Date 10/25/21      PT LONG TERM GOAL #4   Title Pt will demonstrate BIL global hip strength of 5/5 in order to independently progress her LE strengthening regimen without limitation.     Baseline See flowsheet    Period Weeks    Status Partially Met    Target Date 10/25/21      PT LONG TERM GOAL #5   Title Patient will demonstrate 5/5 Lt ankle strength to improve stability necessary for community ambulation.    Baseline see flowsheet    Time 4    Period Weeks    Status New    Target Date 12/05/21      Additional Long Term Goals   Additional Long Term Goals Yes      PT LONG TERM GOAL #6   Title Patient will maintain SLS on the LLE for at least 5 seconds to improve stability when walking on uneven terrain.    Baseline unable    Time 8    Period Weeks    Status New    Target Date 01/02/22                   Plan - 11/29/21 1148     Clinical Impression Statement Heavy focus on gait training today working towards pushing off during stance phase on the LLE. With continued practice and gradual progression of weight acceptance on the LLE she is able to complete push-off on the LLE with shortened step length. One instance of LOB during gait training with patient able to correct independently. She reported soreness with gait training, but only one instance of lateral foot pain when she lost her balance, otherwise she tolerated session well reporting that she felt she was getting back to "normal."    Personal Factors and Comorbidities Comorbidity 3+;Past/Current Experience    Comorbidities OA, HTN, anxety, multiple ankle injuries/ surgeries    Rehab Potential Good    PT Treatment/Interventions ADLs/Self Care Home Management;Cryotherapy;Software engineer;Therapeutic activities;Therapeutic exercise;Balance training;Neuromuscular re-education;Patient/family education;Manual techniques;Passive range of motion;Dry  needling;Taping;Vasopneumatic Device;Stair training;Aquatic Therapy;Iontophoresis 18m/ml Dexamethasone;Ultrasound    PT Next Visit Plan progress WBAT, progress ankle ROM and strengthening as tolerated, balance.    PT Home Exercise  Plan Access Code H[place]and Agree with Plan of Care Patient             Patient will benefit from skilled therapeutic intervention in order to improve the following deficits and impairments:  Abnormal gait, Decreased range of motion, Difficulty walking, Decreased endurance, Decreased activity tolerance, Pain, Decreased balance, Impaired flexibility, Decreased strength, Increased edema  Visit Diagnosis: Pain in left foot  Difficulty in walking, not elsewhere classified  Stiffness of left ankle, not elsewhere classified  Muscle weakness (generalized)     Problem List Patient Active Problem List   Diagnosis Date Noted   Allergic rhinitis 03/15/2021   Allergic rhinitis due to animal (cat) (dog) hair and dander 03/15/2021   Allergic rhinitis due to pollen 03/15/2021   Gastro-esophageal reflux disease without esophagitis 03/15/2021   Mild persistent asthma, uncomplicated 002/72/5366  [place]ortic atherosclerosis ([place] Chest [place] scan on 02/02/2018  01/10/2021   Asthma    Low back pain 03/17/2019   Lumbosacral spondylosis without myelopathy 03/17/2019   Cubital tunnel syndrome 10/14/2018   Closed fracture of distal end of right radius 07/09/2018   Pseudogout 12/09/2017   Major depressive disorder, recurrent, severe without psychotic features ([place]    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   Su[place] attempt by drug ingestion ([place]    Medication management 08/22/2014   Vitamin D deficiency 08/22/2014   Hyperlipidemia, mixed 08/22/2014   Hypertension    Anxiety    Abnormal glucose    Class 2 severe obesity due to excess calories with serious comorbidity and body mass index (BMI) of 37.0 to 37.9 in adult ([facility]   SGwendolyn Grant PT, DPT, ATC 11/29/21 12:14 PM   CL[facility] Center[address] [address]Phone: [phone]  Fax:  [phone] Name: Haley FETTEROLFMRN: [id number] of Birth:  316-Aug-1957

## 2021-12-01 ENCOUNTER — Other Ambulatory Visit: Payer: Self-pay | Admitting: Nurse Practitioner

## 2021-12-01 DIAGNOSIS — I10 Essential (primary) hypertension: Secondary | ICD-10-CM

## 2021-12-03 NOTE — Progress Notes (Signed)
Subjective: Haley White is a 66 y.o. is seen today in office s/p left peroneal tendon repair, anastomosis subsequently she had a fall and had a fracture posterior calcaneus.  Since I last saw her she has been continuing physical therapy and seems to be making good progress.  She presents today wearing a regular shoe but using crutches.  She states that she did have an event at church and she is on her feet quite a bit in the cam boot but had pain and swelling afterwards.  After couple days the pain subsided.  Currently denies any recent fevers or chills.  No chest pain or shortness of breath.  No recent injuries.  Objective: General: No acute distress, AAOx3  DP/PT pulses palpable 2/4, CRT < 3 sec to all digits.  Protective sensation intact. Motor function intact.  Left foot: Incision site along the peroneal tendon has healed with a scar.  There is still some residual tenderness in the course the peroneal tendon.  There is no significant pain with Achilles tendon or posterior calcaneus.  There is no pain with lateral compression of calcaneus.  Minimal edema of the peroneal tendon but no erythema or warmth.  No other areas of discomfort noted.  Flexor, extensor tendons clinically appear to be intact. No pain with calf compression, swelling, warmth, erythema.   MRI 09/08/2021 IMPRESSION: 1. Insufficiency fracture of the calcaneal tuberosity superiorly, near the Achilles insertion, as can be seen associated with diabetes. No displaced fracture or Achilles tendon tear identified. 2. Postsurgical changes related to repair of the previously demonstrated longitudinal split tear of the peroneus brevis tendon. No new tendon findings. 3. Stable subchondral cysts in the lateral talar dome and midfoot degenerative changes. No other acute osseous findings.  Assessment and Plan:  Status post left peroneal tendon anastomosis; calcaneal fracture  -Treatment options discussed including all alternatives,  risks, and complications -She has been making good progress with physical therapy.  Continue with physical therapy.  Continue with her shoe as tolerated.  She notes that she is currently on her feet a lot she can use the cam boot.  If needed we can do an ankle brace with a shoe but I like to get her strength back and mobility back without the brace if possible.  Continue physical therapy for now.  She is scheduled to start aquatic therapy next week which I also think will be beneficial.  Continue ice, elevate as well as compression sock.  Trula Slade DPM

## 2021-12-04 ENCOUNTER — Other Ambulatory Visit: Payer: Self-pay

## 2021-12-04 ENCOUNTER — Ambulatory Visit: Payer: Managed Care, Other (non HMO)

## 2021-12-04 DIAGNOSIS — R262 Difficulty in walking, not elsewhere classified: Secondary | ICD-10-CM

## 2021-12-04 DIAGNOSIS — M25672 Stiffness of left ankle, not elsewhere classified: Secondary | ICD-10-CM

## 2021-12-04 DIAGNOSIS — M79672 Pain in left foot: Secondary | ICD-10-CM

## 2021-12-04 DIAGNOSIS — M6281 Muscle weakness (generalized): Secondary | ICD-10-CM

## 2021-12-04 NOTE — Therapy (Signed)
[place], [place], [address] Phone: [phone]   Fax:  [phone]  Physical Therapy Treatment  Patient Details  Name: Haley White MRN: [id number] Date of Birth: [birth year] Referring Provider (PT): Trula Slade, [place]   Encounter Date: 12/04/2021   PT End of Session - 12/04/21 1003     Visit Number 13    Number of Visits 22    Date for PT Re-Evaluation 01/05/22    Authorization Type [employer]    PT Start Time 1011    PT Stop Time 1057    PT Time Calculation (min) 46 min    Activity Tolerance Patient tolerated treatment well    Behavior During Therapy [facility] for tasks assessed/performed             Past Medical History:  Diagnosis Date   Anxiety    Arthritis    Asthma    Cataract    Gout    Hyperlipidemia    Hypertension    Obese     Past Surgical History:  Procedure Laterality Date   APPENDECTOMY  1984   CATARACT EXTRACTION W/ INTRAOCULAR LENS  IMPLANT, BILATERAL  2014   CESAREAN SECTION  1984, 1987, 1994   X3   COLONOSCOPY  11/16/2013   w/Brodie    FOOT SURGERY Left 2002   [place] Right 2007   TUBAL LIGATION  1994    There were no vitals filed for this visit.   Subjective Assessment - 12/04/21 1017     Subjective Patient reports the ankle is feeling ok today without pain. She has been completing household ambulation without an AD and community ambulation with her crutches.    Currently in Pain? No/denies             [facility] Adult PT Treatment:                                                DATE: 12/04/21 Therapeutic Exercise: Nustep level 4 x 5 minutes focusing on ankle mobility  Calf raises 2 x 10  Toe raises in standing 2 x 10 Squats in parallel bars attempted discontinued due to left knee pain  Gait training: In parallel bars working on increasing step length, push-off on the LLE, and decreasing foot ER.   Neuromuscular  re-ed: Tandem 2 x 30 sec bilateral  Romberg on foam 30 sec  Romberg on foam eyes closed 3 x 30 sec  Semi-tandem eyes closed 1 x 30 sec each  SLS 3 trials LLE 3-5 seconds.       [facility] PT Assessment - 12/04/21 0001       Ambulation/Gait   Ambulation/Gait Yes    Assistive device None    Gait Pattern Step-through pattern    Gait Comments excessive foot ER on the LLE, limited push-off on the LLE                                      PT Short Term Goals - 11/07/21 1032       PT SHORT TERM GOAL #1   Title Pt will report understanding and adherence to her HEP in order to promote independence in the management of her primary  impairments.    Baseline issued at eval.    Time 4    Period Weeks    Status Achieved    Target Date 09/27/21      PT SHORT TERM GOAL #2   Title Pt will demonstrate ability to tolerate lateral weightshifting in standing in order to transition to walking with less limitation.    Baseline able to weightshift in standing with tennis shoe with mild pain. 11/07/21    Time 4    Period Weeks    Status Achieved               PT Long Term Goals - 11/07/21 1032       PT LONG TERM GOAL #1   Title Pt will achieve a FOTO score of 55% or higher in order to demonstrate improved functional ability as it relates to her L ankle pain.    Baseline see flowsheet    Time 8    Period Weeks    Status On-going    Target Date 10/25/21      PT LONG TERM GOAL #2   Title Pt will demonstrate the ability to perform a 6-minute walk test without CAM boot or AD at self-selected pace in order to progress to safe community ambulation.    Baseline currently WBAT in CAM boot and crutches    Time 8    Period Weeks    Status Unable to assess    Target Date 10/25/21      PT LONG TERM GOAL #3   Title Pt will demonstrate Lt ankle inversion/eversion AROM WNL with 0-2/10 pain in order to put on her socks and shoes without limitation.    Baseline See flowsheet     Time 8    Period Weeks    Status Revised    Target Date 10/25/21      PT LONG TERM GOAL #4   Title Pt will demonstrate BIL global hip strength of 5/5 in order to independently progress her LE strengthening regimen without limitation.    Baseline See flowsheet    Period Weeks    Status Partially Met    Target Date 10/25/21      PT LONG TERM GOAL #5   Title Patient will demonstrate 5/5 Lt ankle strength to improve stability necessary for community ambulation.    Baseline see flowsheet    Time 4    Period Weeks    Status New    Target Date 12/05/21      Additional Long Term Goals   Additional Long Term Goals Yes      PT LONG TERM GOAL #6   Title Patient will maintain SLS on the LLE for at least 5 seconds to improve stability when walking on uneven terrain.    Baseline unable    Time 8    Period Weeks    Status New    Target Date 01/02/22                   Plan - 12/04/21 1024     Clinical Impression Statement Continued to focus on gait training working on push-off on the LLE and decreasing excessive foot ER. She is able to control foot ER with cueing. She is able to generate push-off on the LLE, but not until RLE is in midstance secondary to plantarflexor weakness on the LLE. Attempted mini squats, though due to left knee pain this was discontinued. Able to progress static balance with patient demonstrating fair  postural control with LLE in rear for tandem stance and during balance training on unstable surface. No ankle/foot pain reported throughout session.    Personal Factors and Comorbidities Comorbidity 3+;Past/Current Experience    Comorbidities OA, HTN, anxety, multiple ankle injuries/ surgeries    Rehab Potential Good    PT Treatment/Interventions ADLs/Self Care Home Management;Cryotherapy;Software engineer;Therapeutic activities;Therapeutic exercise;Balance training;Neuromuscular re-education;Patient/family education;Manual  techniques;Passive range of motion;Dry needling;Taping;Vasopneumatic Device;Stair training;Aquatic Therapy;Iontophoresis 67m/ml Dexamethasone;Ultrasound    PT Next Visit Plan gait training, balance, ankle strength/ROM    PT Home Exercise Plan Access Code [email or web address]    Consulted and Agree with Plan of Care Patient             Patient will benefit from skilled therapeutic intervention in order to improve the following deficits and impairments:  Abnormal gait, Decreased range of motion, Difficulty walking, Decreased endurance, Decreased activity tolerance, Pain, Decreased balance, Impaired flexibility, Decreased strength, Increased edema  Visit Diagnosis: Pain in left foot  Difficulty in walking, not elsewhere classified  Stiffness of left ankle, not elsewhere classified  Muscle weakness (generalized)     Problem List Patient Active Problem List   Diagnosis Date Noted   Allergic rhinitis 03/15/2021   Allergic rhinitis due to animal (cat) (dog) hair and dander 03/15/2021   Allergic rhinitis due to pollen 03/15/2021   Gastro-esophageal reflux disease without esophagitis 03/15/2021   Mild persistent asthma, uncomplicated 069/45/0388  [facility] on 02/02/2018  01/10/2021   Asthma    Low back pain 03/17/2019   Lumbosacral spondylosis without myelopathy 03/17/2019   Cubital tunnel syndrome 10/14/2018   Closed fracture of distal end of right radius 07/09/2018   Pseudogout 12/09/2017   Major depressive disorder, recurrent, severe without psychotic features ([place]    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

## 2021-12-06 ENCOUNTER — Other Ambulatory Visit: Payer: Self-pay

## 2021-12-06 ENCOUNTER — Ambulatory Visit (HOSPITAL_BASED_OUTPATIENT_CLINIC_OR_DEPARTMENT_OTHER): Payer: Commercial Managed Care - HMO | Attending: Internal Medicine | Admitting: Physical Therapy

## 2021-12-06 ENCOUNTER — Encounter (HOSPITAL_BASED_OUTPATIENT_CLINIC_OR_DEPARTMENT_OTHER): Payer: Self-pay | Admitting: Physical Therapy

## 2021-12-06 DIAGNOSIS — M25672 Stiffness of left ankle, not elsewhere classified: Secondary | ICD-10-CM | POA: Insufficient documentation

## 2021-12-06 DIAGNOSIS — M6281 Muscle weakness (generalized): Secondary | ICD-10-CM | POA: Diagnosis not present

## 2021-12-06 DIAGNOSIS — S96919D Strain of unspecified muscle and tendon at ankle and foot level, unspecified foot, subsequent encounter: Secondary | ICD-10-CM

## 2021-12-06 DIAGNOSIS — M79672 Pain in left foot: Secondary | ICD-10-CM | POA: Diagnosis not present

## 2021-12-06 DIAGNOSIS — R262 Difficulty in walking, not elsewhere classified: Secondary | ICD-10-CM | POA: Diagnosis not present

## 2021-12-06 DIAGNOSIS — Z9889 Other specified postprocedural states: Secondary | ICD-10-CM

## 2021-12-06 NOTE — Therapy (Signed)
[facility] [address] [place], [place], [address] Phone: [phone]   Fax:  [phone]  Physical Therapy Treatment  Patient Details  Name: Haley White MRN: [id number] Date of Birth: [birth year] Referring Provider (PT): Trula Slade, [place]   Encounter Date: 12/06/2021   PT End of Session - 12/06/21 1038     Visit Number 14    Number of Visits 22    Date for PT Re-Evaluation 01/05/22    Authorization Type [employer]    PT Start Time 1034    PT Stop Time 1115    PT Time Calculation (min) 41 min    Activity Tolerance Patient tolerated treatment well    Behavior During Therapy [facility] for tasks assessed/performed             Past Medical History:  Diagnosis Date   Anxiety    Arthritis    Asthma    Cataract    Gout    Hyperlipidemia    Hypertension    Obese     Past Surgical History:  Procedure Laterality Date   APPENDECTOMY  1984   CATARACT EXTRACTION W/ INTRAOCULAR LENS  IMPLANT, BILATERAL  2014   CESAREAN SECTION  1984, 1987, 1994   X3   COLONOSCOPY  11/16/2013   w/Brodie    FOOT SURGERY Left 2002   KNEE SURGERY Left 1998   ROTATOR CUFF REPAIR Right 2007   TUBAL LIGATION  1994    There were no vitals filed for this visit.                                 PT Short Term Goals - 11/07/21 1032       PT SHORT TERM GOAL #1   Title Pt will report understanding and adherence to her HEP in order to promote independence in the management of her primary impairments.    Baseline issued at eval.    Time 4    Period Weeks    Status Achieved    Target Date 09/27/21      PT SHORT TERM GOAL #2   Title Pt will demonstrate ability to tolerate lateral weightshifting in standing in order to transition to walking with less limitation.    Baseline able to weightshift in standing with tennis shoe with mild pain. 11/07/21    Time 4    Period Weeks    Status Achieved               PT  Long Term Goals - 11/07/21 1032       PT LONG TERM GOAL #1   Title Pt will achieve a FOTO score of 55% or higher in order to demonstrate improved functional ability as it relates to her L ankle pain.    Baseline see flowsheet    Time 8    Period Weeks    Status On-going    Target Date 10/25/21      PT LONG TERM GOAL #2   Title Pt will demonstrate the ability to perform a 6-minute walk test without CAM boot or AD at self-selected pace in order to progress to safe community ambulation.    Baseline currently WBAT in CAM boot and crutches    Time 8    Period Weeks    Status Unable to assess    Target Date 10/25/21      PT LONG TERM GOAL #3  Title Pt will demonstrate Lt ankle inversion/eversion AROM WNL with 0-2/10 pain in order to put on her socks and shoes without limitation.    Baseline See flowsheet    Time 8    Period Weeks    Status Revised    Target Date 10/25/21      PT LONG TERM GOAL #4   Title Pt will demonstrate BIL global hip strength of 5/5 in order to independently progress her LE strengthening regimen without limitation.    Baseline See flowsheet    Period Weeks    Status Partially Met    Target Date 10/25/21      PT LONG TERM GOAL #5   Title Patient will demonstrate 5/5 Lt ankle strength to improve stability necessary for community ambulation.    Baseline see flowsheet    Time 4    Period Weeks    Status New    Target Date 12/05/21      Additional Long Term Goals   Additional Long Term Goals Yes      PT LONG TERM GOAL #6   Title Patient will maintain SLS on the LLE for at least 5 seconds to improve stability when walking on uneven terrain.    Baseline unable    Time 8    Period Weeks    Status New    Target Date 01/02/22           Pt seen for aquatic therapy today.  Treatment took place in water 3.25-4.8 ft in depth at the [employer] pool. Temp of water was 91.  Pt entered/exited the pool via stairs  step to pattern independently with  bilat rail  Intro to setting Walking beginning in 3 ft progressed to 4 ft 6. Added hand buoys to assist with balance in increasing depths.  In 92f6 Df/pf 2x20  Fwd, backward and sidestepping. Gait training: proper heel and toe strike Toe walking and heel walking  In 431fFwd Backward and side stepping multiple widths  Seated: flutter kicking with concentration on DF/PF with each stride 5x25 reps   -add/abd hips, ankle DF with eversion 2x25.  ROM left ankle inversion eversion in seated position x 10  Pt requires buoyancy for support and to offload joints with strengthening exercises. Viscosity of the water is needed for resistance of strengthening; water current perturbations provides challenge to standing balance unsupported, requiring increased core activation.         Plan - 12/06/21 1040     Clinical Impression Statement Pt introduced to setting.  She is confident and indep.  Placed pt in varying depths testing toleration to amb/weight bearing.  Found that 80% (75f60f) submerged pt without pain with amb and Df/PF. 4 ft adds ressitance with added weightbearing and is tolerated well.  Alternated use of hand buoys and holding to wall for added core engagement and balance challenges. Was able to incorporate aerobic capacity challenges which she enjoyed.  Gait pattern improved submerged.  Pt without anticipation of pain, improved timing with heel strike and toe off. Intructed on weight shift and timing. She is an excellent candidate for aquatic therapy to strengthen in full ranges adding weightbering and resistance when tolerated with more control and practicing normal gait pattern.    Personal Factors and Comorbidities Comorbidity 3+;Past/Current Experience    Examination-Activity Limitations Carry;Lift;Locomotion Level;Stairs;Stand;Sit;Transfers;Squat    Examination-Participation Restrictions Church;Cleaning;Community Activity;Laundry;Meal Prep;Shop    Stability/Clinical Decision Making  Evolving/Moderate complexity    Clinical Decision Making Moderate  Rehab Potential Good    PT Frequency 2x / week    PT Duration 8 weeks    PT Treatment/Interventions ADLs/Self Care Home Management;Cryotherapy;Software engineer;Therapeutic activities;Therapeutic exercise;Balance training;Neuromuscular re-education;Patient/family education;Manual techniques;Passive range of motion;Dry needling;Taping;Vasopneumatic Device;Stair training;Aquatic Therapy;Iontophoresis 71m/ml Dexamethasone;Ultrasound    PT Next Visit Plan gait training, balance, ankle strength/ROM             Patient will benefit from skilled therapeutic intervention in order to improve the following deficits and impairments:  Abnormal gait, Decreased range of motion, Difficulty walking, Decreased endurance, Decreased activity tolerance, Pain, Decreased balance, Impaired flexibility, Decreased strength, Increased edema  Visit Diagnosis: Pain in left foot  Difficulty in walking, not elsewhere classified  Stiffness of left ankle, not elsewhere classified  Muscle weakness (generalized)  Status post left foot surgery  Tendon tear, ankle, unspecified laterality, subsequent encounter     Problem List Patient Active Problem List   Diagnosis Date Noted   Allergic rhinitis 03/15/2021   Allergic rhinitis due to animal (cat) (dog) hair and dander 03/15/2021   Allergic rhinitis due to pollen 03/15/2021   Gastro-esophageal reflux disease without esophagitis 03/15/2021   Mild persistent asthma, uncomplicated 059/74/1638  [place]ortic atherosclerosis ([place] Chest CT scan on 02/02/2018  01/10/2021   Asthma    Low back pain 03/17/2019   Lumbosacral spondylosis without myelopathy 03/17/2019   Cubital tunnel syndrome 10/14/2018   Closed fracture of distal end of right radius 07/09/2018   Pseudogout 12/09/2017   Major depressive disorder, recurrent, severe without psychotic features ([place]     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

## 2021-12-10 ENCOUNTER — Encounter: Payer: Self-pay | Admitting: Podiatry

## 2021-12-11 ENCOUNTER — Other Ambulatory Visit: Payer: Self-pay

## 2021-12-11 ENCOUNTER — Ambulatory Visit: Payer: Managed Care, Other (non HMO) | Attending: Podiatry

## 2021-12-11 DIAGNOSIS — M6281 Muscle weakness (generalized): Secondary | ICD-10-CM | POA: Insufficient documentation

## 2021-12-11 DIAGNOSIS — M25672 Stiffness of left ankle, not elsewhere classified: Secondary | ICD-10-CM | POA: Insufficient documentation

## 2021-12-11 DIAGNOSIS — Z9889 Other specified postprocedural states: Secondary | ICD-10-CM | POA: Insufficient documentation

## 2021-12-11 DIAGNOSIS — M79672 Pain in left foot: Secondary | ICD-10-CM | POA: Insufficient documentation

## 2021-12-11 DIAGNOSIS — R262 Difficulty in walking, not elsewhere classified: Secondary | ICD-10-CM | POA: Diagnosis present

## 2021-12-11 DIAGNOSIS — S96919D Strain of unspecified muscle and tendon at ankle and foot level, unspecified foot, subsequent encounter: Secondary | ICD-10-CM | POA: Diagnosis present

## 2021-12-11 NOTE — Therapy (Signed)
[place], [place], [address] Phone: [phone]   Fax:  [phone]  Physical Therapy Treatment  Patient Details  Name: Haley White MRN: [id number] Date of Birth: 02-04-1956 Referring Provider (PT): Trula Slade, [place]   Encounter Date: 12/11/2021   PT End of Session - 12/11/21 1014     Visit Number 15    Number of Visits 22    Date for PT Re-Evaluation 01/05/22    Authorization Type [employer]    PT Start Time 1015    PT Stop Time 1059    PT Time Calculation (min) 44 min    Activity Tolerance Patient tolerated treatment well    Behavior During Therapy [facility] for tasks assessed/performed             Past Medical History:  Diagnosis Date   Anxiety    Arthritis    Asthma    Cataract    Gout    Hyperlipidemia    Hypertension    Obese     Past Surgical History:  Procedure Laterality Date   APPENDECTOMY  1984   CATARACT EXTRACTION W/ INTRAOCULAR LENS  IMPLANT, BILATERAL  2014   CESAREAN SECTION  1984, 1987, 1994   X3   COLONOSCOPY  11/16/2013   w/Brodie    FOOT SURGERY Left 2002   KNEE SURGERY Left 1998   ROTATOR CUFF REPAIR Right 2007   TUBAL LIGATION  1994    There were no vitals filed for this visit.   Subjective Assessment - 12/11/21 1016     Subjective "The foot is feeling good today. I love aquatic therapy."    Currently in Pain? No/denies                 [facility] PT Assessment - 12/11/21 0001       Observation/Other Assessments   Focus on Therapeutic Outcomes (FOTO)  49%             [facility] Adult PT Treatment:                                                DATE: 12/11/21 Therapeutic Exercise: NuStep level 5 UE/LE x 5 minutes  Calf stretch on wedge 60 seconds Calf raise 2 x 15  Pilates reformer SL calf raise on LLE 2 x 10; red [place]  Squats on reformer attempted; discontinued due to Lt knee pain.   Self Care: Answered patient questions regarding chronic pain  clinic, water aerobic class, ongoing Lt knee pain, swimming [facility].  Adjusted SPC and instructed/completed stair negotiation with SPC with appropriate step to pattern.                           PT Short Term Goals - 11/07/21 1032       PT SHORT TERM GOAL #1   Title Pt will report understanding and adherence to her HEP in order to promote independence in the management of her primary impairments.    Baseline issued at eval.    Time 4    Period Weeks    Status Achieved    Target Date 09/27/21      PT SHORT TERM GOAL #2   Title Pt will demonstrate ability to tolerate lateral weightshifting in standing in order to transition to  walking with less limitation.    Baseline able to weightshift in standing with tennis shoe with mild pain. 11/07/21    Time 4    Period Weeks    Status Achieved               PT Long Term Goals - 11/07/21 1032       PT LONG TERM GOAL #1   Title Pt will achieve a FOTO score of 55% or higher in order to demonstrate improved functional ability as it relates to her L ankle pain.    Baseline see flowsheet    Time 8    Period Weeks    Status On-going    Target Date 10/25/21      PT LONG TERM GOAL #2   Title Pt will demonstrate the ability to perform a 6-minute walk test without CAM boot or AD at self-selected pace in order to progress to safe community ambulation.    Baseline currently WBAT in CAM boot and crutches    Time 8    Period Weeks    Status Unable to assess    Target Date 10/25/21      PT LONG TERM GOAL #3   Title Pt will demonstrate Lt ankle inversion/eversion AROM WNL with 0-2/10 pain in order to put on her socks and shoes without limitation.    Baseline See flowsheet    Time 8    Period Weeks    Status Revised    Target Date 10/25/21      PT LONG TERM GOAL #4   Title Pt will demonstrate BIL global hip strength of 5/5 in order to independently progress her LE strengthening regimen without limitation.    Baseline  See flowsheet    Period Weeks    Status Partially Met    Target Date 10/25/21      PT LONG TERM GOAL #5   Title Patient will demonstrate 5/5 Lt ankle strength to improve stability necessary for community ambulation.    Baseline see flowsheet    Time 4    Period Weeks    Status New    Target Date 12/05/21      Additional Long Term Goals   Additional Long Term Goals Yes      PT LONG TERM GOAL #6   Title Patient will maintain SLS on the LLE for at least 5 seconds to improve stability when walking on uneven terrain.    Baseline unable    Time 8    Period Weeks    Status New    Target Date 01/02/22                   Plan - 12/11/21 1023     Clinical Impression Statement Patient tolerated session well today focusing on continued ankle strengthening/mobility. Able to introduce SL calf raise on the LLE on pilates reformer without reports of pain and good form. She was able to complete stair negotation utilizing step to pattern with [facility] appropriately without reports of ankle pain. Overall good tolerance to today's session with the exception of squats on reformer as this caused Lt knee pain, so this exercise was discontinued.    PT Treatment/Interventions ADLs/Self Care Home Management;Cryotherapy;Software engineer;Therapeutic activities;Therapeutic exercise;Balance training;Neuromuscular re-education;Patient/family education;Manual techniques;Passive range of motion;Dry needling;Taping;Vasopneumatic Device;Stair training;Aquatic Therapy;Iontophoresis 58m/ml Dexamethasone;Ultrasound    PT Next Visit Plan gait training, balance, ankle strength/ROM    PT Home Exercise Plan Access Code [email or web address]    Consulted and Agree  with Plan of Care Patient             Patient will benefit from skilled therapeutic intervention in order to improve the following deficits and impairments:  Abnormal gait, Decreased range of motion, Difficulty walking, Decreased  endurance, Decreased activity tolerance, Pain, Decreased balance, Impaired flexibility, Decreased strength, Increased edema  Visit Diagnosis: Pain in left foot  Difficulty in walking, not elsewhere classified  Stiffness of left ankle, not elsewhere classified  Muscle weakness (generalized)     Problem List Patient Active Problem List   Diagnosis Date Noted   Allergic rhinitis 03/15/2021   Allergic rhinitis due to animal (cat) (dog) hair and dander 03/15/2021   Allergic rhinitis due to pollen 03/15/2021   Gastro-esophageal reflux disease without esophagitis 03/15/2021   Mild persistent asthma, uncomplicated 70/48/8891   Aortic atherosclerosis ([place]) by Chest CT scan on 02/02/2018  01/10/2021   Asthma    Low back pain 03/17/2019   Lumbosacral spondylosis without myelopathy 03/17/2019   Cubital tunnel syndrome 10/14/2018   Closed fracture of distal end of right radius 07/09/2018   Pseudogout 12/09/2017   Major depressive disorder, recurrent, severe without psychotic features ([place])    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 [place], [place], [address] Phone: [phone]   Fax:  [phone]  Name: BEVERLEE WILMARTH MRN: [id number] Date of Birth: Mar 11, 1956

## 2021-12-13 ENCOUNTER — Ambulatory Visit (HOSPITAL_BASED_OUTPATIENT_CLINIC_OR_DEPARTMENT_OTHER): Payer: Commercial Managed Care - HMO | Admitting: Physical Therapy

## 2021-12-13 ENCOUNTER — Encounter (HOSPITAL_BASED_OUTPATIENT_CLINIC_OR_DEPARTMENT_OTHER): Payer: Self-pay | Admitting: Physical Therapy

## 2021-12-13 ENCOUNTER — Other Ambulatory Visit: Payer: Self-pay

## 2021-12-13 DIAGNOSIS — S96919D Strain of unspecified muscle and tendon at ankle and foot level, unspecified foot, subsequent encounter: Secondary | ICD-10-CM

## 2021-12-13 DIAGNOSIS — M6281 Muscle weakness (generalized): Secondary | ICD-10-CM

## 2021-12-13 DIAGNOSIS — M79672 Pain in left foot: Secondary | ICD-10-CM | POA: Diagnosis not present

## 2021-12-13 DIAGNOSIS — Z9889 Other specified postprocedural states: Secondary | ICD-10-CM

## 2021-12-13 DIAGNOSIS — R262 Difficulty in walking, not elsewhere classified: Secondary | ICD-10-CM

## 2021-12-13 DIAGNOSIS — M25672 Stiffness of left ankle, not elsewhere classified: Secondary | ICD-10-CM

## 2021-12-13 NOTE — Therapy (Signed)
[facility] [address] [place], [place], [address] Phone: [phone]   Fax:  [phone]  Physical Therapy Treatment  Patient Details  Name: Haley White MRN: [id number] Date of Birth: 1956/09/27 Referring Provider (PT): Trula Slade, [place]   Encounter Date: 12/13/2021   PT End of Session - 12/13/21 1030     Visit Number 16    Number of Visits 22    Date for PT Re-Evaluation 01/05/22    Authorization Type [employer]    PT Start Time 1030    PT Stop Time 1115    PT Time Calculation (min) 45 min    Activity Tolerance Patient tolerated treatment well    Behavior During Therapy [facility] for tasks assessed/performed             Past Medical History:  Diagnosis Date   Anxiety    Arthritis    Asthma    Cataract    Gout    Hyperlipidemia    Hypertension    Obese     Past Surgical History:  Procedure Laterality Date   APPENDECTOMY  1984   CATARACT EXTRACTION W/ INTRAOCULAR LENS  IMPLANT, BILATERAL  2014   CESAREAN SECTION  1984, 1987, 1994   X3   COLONOSCOPY  11/16/2013   w/Brodie    FOOT SURGERY Left 2002   KNEE SURGERY Left 1998   ROTATOR CUFF REPAIR Right 2007   TUBAL LIGATION  1994    There were no vitals filed for this visit.   Subjective Assessment - 12/13/21 1044     Subjective I'm off my crutches adn have lost 9 lb with weight watchers, no pain i'm doing well"    Pain Score 1     Pain Orientation Left;Lower    Pain Descriptors / Indicators Aching    Pain Type Chronic pain    Pain Onset More than a month ago                                          PT Short Term Goals - 11/07/21 1032       PT SHORT TERM GOAL #1   Title Pt will report understanding and adherence to her HEP in order to promote independence in the management of her primary impairments.    Baseline issued at eval.    Time 4    Period Weeks    Status Achieved    Target Date 09/27/21      PT SHORT  TERM GOAL #2   Title Pt will demonstrate ability to tolerate lateral weightshifting in standing in order to transition to walking with less limitation.    Baseline able to weightshift in standing with tennis shoe with mild pain. 11/07/21    Time 4    Period Weeks    Status Achieved               PT Long Term Goals - 11/07/21 1032       PT LONG TERM GOAL #1   Title Pt will achieve a FOTO score of 55% or higher in order to demonstrate improved functional ability as it relates to her L ankle pain.    Baseline see flowsheet    Time 8    Period Weeks    Status On-going    Target Date 10/25/21      PT LONG TERM GOAL #  2   Title Pt will demonstrate the ability to perform a 6-minute walk test without CAM boot or AD at self-selected pace in order to progress to safe community ambulation.    Baseline currently WBAT in CAM boot and crutches    Time 8    Period Weeks    Status Unable to assess    Target Date 10/25/21      PT LONG TERM GOAL #3   Title Pt will demonstrate Lt ankle inversion/eversion AROM WNL with 0-2/10 pain in order to put on her socks and shoes without limitation.    Baseline See flowsheet    Time 8    Period Weeks    Status Revised    Target Date 10/25/21      PT LONG TERM GOAL #4   Title Pt will demonstrate BIL global hip strength of 5/5 in order to independently progress her LE strengthening regimen without limitation.    Baseline See flowsheet    Period Weeks    Status Partially Met    Target Date 10/25/21      PT LONG TERM GOAL #5   Title Patient will demonstrate 5/5 Lt ankle strength to improve stability necessary for community ambulation.    Baseline see flowsheet    Time 4    Period Weeks    Status New    Target Date 12/05/21      Additional Long Term Goals   Additional Long Term Goals Yes      PT LONG TERM GOAL #6   Title Patient will maintain SLS on the LLE for at least 5 seconds to improve stability when walking on uneven terrain.    Baseline  unable    Time 8    Period Weeks    Status New    Target Date 01/02/22            Pt seen for aquatic therapy today.  Treatment took place in water 3.25-4.8 ft in depth at the [facility] pool. Temp of water was 91.  Pt entered/exited the pool via stairs  step to pattern independently with bilat rail   Walking in 3 ft forward, back and side stepping 4-6 widths each. Seated: flutter kicking with concentration on DF/PF with each stride 5x25 reps   -add/abd hips, ankle DF with eversion 5 x25. - STS from 3 step.  Pt requires verbal and tactile cues and demonstration x10-12  Standing on bottom water step DF and PF x 10 -squats bottom step x 10 -pool bottom 3 ft  -SL TR R/L  2x10 Balance: SLS R/L 4 ft 3x30 sec  Gait training in 4 ft forward and backward using yellow ue support -cues for equal stance phase; appropriate heel toe/toe heel -Forward toe walking x 2 widths -forward heel walking x 2 widths.    Pt requires buoyancy for support and to offload joints with strengthening exercises. Viscosity of the water is needed for resistance of strengthening; water current perturbations provides challenge to standing balance unsupported, requiring increased core activation.       Plan - 12/13/21 1045     Clinical Impression Statement Pt with excellent response to lasts on land and aquatic sessions.  She has improved with decreased pain and is not amb with cane. She is able to tolerate amb in 3 ft as well as DF and PF without discomfort. Completed squats which she tolerates well submerged 30%. Worked on Left ankle  proprioception, strengtheing and ROM.  She is pain  free throughout.  She will begin swimming at [facility] once she gets her membership.  Progressing well.    Stability/Clinical Decision Making Evolving/Moderate complexity    Clinical Decision Making Moderate    Rehab Potential Good    PT Frequency 2x / week    PT Duration 8 weeks    PT Treatment/Interventions ADLs/Self  Care Home Management;Cryotherapy;Software engineer;Therapeutic activities;Therapeutic exercise;Balance training;Neuromuscular re-education;Patient/family education;Manual techniques;Passive range of motion;Dry needling;Taping;Vasopneumatic Device;Stair training;Aquatic Therapy;Iontophoresis 22m/ml Dexamethasone;Ultrasound    PT Next Visit Plan PAin?? Membership to [employer] [place]/9/23             Patient will benefit from skilled therapeutic intervention in order to improve the following deficits and impairments:  Abnormal gait, Decreased range of motion, Difficulty walking, Decreased endurance, Decreased activity tolerance, Pain, Decreased balance, Impaired flexibility, Decreased strength, Increased edema  Visit Diagnosis: Pain in left foot  Difficulty in walking, not elsewhere classified  Stiffness of left ankle, not elsewhere classified  Muscle weakness (generalized)  Status post left foot surgery  Tendon tear, ankle, unspecified laterality, subsequent encounter     Problem List Patient Active Problem List   Diagnosis Date Noted   Allergic rhinitis 03/15/2021   Allergic rhinitis due to animal (cat) (dog) hair and dander 03/15/2021   Allergic rhinitis due to pollen 03/15/2021   Gastro-esophageal reflux disease without esophagitis 03/15/2021   Mild persistent asthma, uncomplicated 031/49/7026  Aortic atherosclerosis ([place] Chest [place] scan on 02/02/2018  01/10/2021   Asthma    Low back pain 03/17/2019   Lumbosacral spondylosis without myelopathy 03/17/2019   Cubital tunnel syndrome 10/14/2018   Closed fracture of distal end of right radius 07/09/2018   Pseudogout 12/09/2017   Major depressive disorder, recurrent, severe without psychotic features ([place]    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

## 2021-12-17 ENCOUNTER — Ambulatory Visit: Payer: Managed Care, Other (non HMO)

## 2021-12-18 NOTE — Therapy (Signed)
[place], [place], [address] Phone: [phone]   Fax:  [phone]  Physical Therapy Treatment  Patient Details  Name: Haley White MRN: [id number] Date of Birth: 04-26-1956 Referring Provider (PT): Trula Slade, [place]   Encounter Date: 12/19/2021   PT End of Session - 12/19/21 1414     Visit Number 17    Number of Visits 22    Date for PT Re-Evaluation 01/05/22    Authorization Type Cign[employer]    PT Start Time 1416    PT Stop Time 1500    PT Time Calculation (min) 44 min    Activity Tolerance Patient tolerated treatment well    Behavior During Therapy [facility] for tasks assessed/performed              Past Medical History:  Diagnosis Date   Anxiety    Arthritis    Asthma    Cataract    Gout    Hyperlipidemia    Hypertension    Obese     Past Surgical History:  Procedure Laterality Date   APPENDECTOMY  1984   CATARACT EXTRACTION W/ INTRAOCULAR LENS  IMPLANT, BILATERAL  2014   CESAREAN SECTION  1984, 1987, 1994   X3   COLONOSCOPY  11/16/2013   w/Brodie    FOOT SURGERY Left 2002   KNEE SURGERY Left 1998   ROTATOR CUFF REPAIR Right 2007   TUBAL LIGATION  1994    There were no vitals filed for this visit.   Subjective Assessment - 12/19/21 1608     Subjective I have been doing well in aquatics.  [facility] PT keeps telling me my tissues are healed but it seems to be taking a long time with the pain    Diagnostic tests 06/06/2021: MRI L Ankle Without Contrast: IMPRESSION:  1. 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  Long segment longitudinal split tear of the peroneus brevis tendon.  Peroneus longus tendon is laterally subluxed.  2. 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  3. 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    Pain Score 3     Pain  Location Foot    Pain Orientation Left    Pain Descriptors / Indicators Aching    Pain Type Chronic pain    Pain Onset More than a month ago                                           PT Short Term Goals - 11/07/21 1032       PT SHORT TERM GOAL #1   Title Pt will report understanding and adherence to her HEP in order to promote independence in the management of her primary impairments.    Baseline issued at eval.    Time 4    Period Weeks    Status Achieved    Target Date 09/27/21      PT SHORT TERM GOAL #2   Title Pt will demonstrate ability to tolerate lateral weightshifting in standing in order to transition to walking with less limitation.    Baseline able to weightshift in standing with tennis shoe with mild pain. 11/07/21    Time 4    Period Weeks    Status Achieved  PT Long Term Goals - 11/07/21 1032       PT LONG TERM GOAL #1   Title Pt will achieve a FOTO score of 55% or higher in order to demonstrate improved functional ability as it relates to her L ankle pain.    Baseline see flowsheet    Time 8    Period Weeks    Status On-going    Target Date 10/25/21      PT LONG TERM GOAL #2   Title Pt will demonstrate the ability to perform a 6-minute walk test without CAM boot or AD at self-selected pace in order to progress to safe community ambulation.    Baseline currently WBAT in CAM boot and crutches    Time 8    Period Weeks    Status Unable to assess    Target Date 10/25/21      PT LONG TERM GOAL #3   Title Pt will demonstrate Lt ankle inversion/eversion AROM WNL with 0-2/10 pain in order to put on her socks and shoes without limitation.    Baseline See flowsheet    Time 8    Period Weeks    Status Revised    Target Date 10/25/21      PT LONG TERM GOAL #4   Title Pt will demonstrate BIL global hip strength of 5/5 in order to independently progress her LE strengthening regimen without limitation.     Baseline See flowsheet    Period Weeks    Status Partially Met    Target Date 10/25/21      PT LONG TERM GOAL #5   Title Patient will demonstrate 5/5 Lt ankle strength to improve stability necessary for community ambulation.    Baseline see flowsheet    Time 4    Period Weeks    Status New    Target Date 12/05/21      Additional Long Term Goals   Additional Long Term Goals Yes      PT LONG TERM GOAL #6   Title Patient will maintain SLS on the LLE for at least 5 seconds to improve stability when walking on uneven terrain.    Baseline unable    Time 8    Period Weeks    Status New    Target Date 01/02/22            Pt seen for aquatic therapy today.  Treatment took place in water 3.25-4.8 ft in depth at the [facility] pool. Temp of water was 90.  Pt entered/exited the pool via stairs  step to pattern independently with bilat rail   Walking in 3 ft forward, back and side stepping 4-6 widths each utilizing aquatic barbells Seated: flutter kicking with concentration on DF/PF with each stride 5x25 reps added IN/EV as pt tolerated Toe yoga standing Left great toe distraction and mobilization to increase Toe Extension  STS from 3 step. Increased trunk lean to and wt shift to R Pt requires verbal and tactile cues and demonstration  Hip abd/add R/Leach and then using 1 UE support Hip ext/flex with knee straight  pt needing VC and TC for correct execution and sequencing Marching knee/hip 90/90  Marching with added pool noodle for increased resistance  Ham curl R/L  Standing gastroc stretch with great toe flexed at pool wall R and L  Squats  Runners stretch on R and L 30 sec x 2 and then stretch in to runners hamstring stretch 2 x 30 sec R and L  Figure 4 squat stretch with UE support for R and L x 60 sec each    Balance: SLS R/L 3x30 sec standing on pool noodle balance with rocker forward and backward for uneven stepping      Pt requires buoyancy for support and to  offload joints with strengthening exercises. Viscosity of the water is needed for resistance of strengthening; water current perturbations provides challenge to standing balance unsupported, requiring increased core activation.       Plan - 12/19/21 1611     Clinical Impression Statement Pt enters pool area with SPC with 3/10 pain in foot lateral side/peroneal.  Pt was able to participate with DF and PF without exacerbating pain.  Pt has more discomfort with gentle AROM for IN and EV.  Pt with some limitatiaon L Great toe extension and responded favorably to distraction and Mobs.  Pt today was able to complete squats with 50% depth today as well as heel raises at 80% depth. continued work with ankle proprioception, strengthening and AROM with Aqua stretch and Great toe mobs/foot  mobs for increased moblity with Toe off /propulsion. Pt with 3/10 at end of session but tolerated ankle /L LE distraction with decreased pain ot 2/10 at end of aquatic session    Personal Factors and Comorbidities Comorbidity 3+;Past/Current Experience    Comorbidities OA, HTN, anxety, multiple ankle injuries/ surgeries    Examination-Activity Limitations Carry;Lift;Locomotion Level;Stairs;Stand;Sit;Transfers;Squat    Examination-Participation Restrictions Church;Cleaning;Community Activity;Laundry;Meal Prep;Shop    Rehab Potential Good    PT Frequency 2x / week    PT Duration 8 weeks    PT Treatment/Interventions ADLs/Self Care Home Management;Cryotherapy;Software engineer;Therapeutic activities;Therapeutic exercise;Balance training;Neuromuscular re-education;Patient/family education;Manual techniques;Passive range of motion;Dry needling;Taping;Vasopneumatic Device;Stair training;Aquatic Therapy;Iontophoresis 44m/ml Dexamethasone;Ultrasound    PT Next Visit Plan Assess Aquatic aquastretch session  [place] 12/13/21     Consulted and Agree with Plan of Care Patient              Patient will benefit from skilled therapeutic intervention in order to improve the following deficits and impairments:  Abnormal gait, Decreased range of motion, Difficulty walking, Decreased endurance, Decreased activity tolerance, Pain, Decreased balance, Impaired flexibility, Decreased strength, Increased edema  Visit Diagnosis: Pain in left foot  Difficulty in walking, not elsewhere classified  Stiffness of left ankle, not elsewhere classified  Tendon tear, ankle, unspecified laterality, subsequent encounter  Status post left foot surgery  Muscle weakness (generalized)     Problem List Patient Active Problem List   Diagnosis Date Noted   Allergic rhinitis 03/15/2021   Allergic rhinitis due to animal (cat) (dog) hair and dander 03/15/2021   Allergic rhinitis due to pollen 03/15/2021   Gastro-esophageal reflux disease without esophagitis 03/15/2021   Mild persistent asthma, uncomplicated 022/97/9892  [place]ortic [place] ([place] on 02/02/2018  01/10/2021   Asthma    Low back pain 03/17/2019   Lumbosacral spondylosis without myelopathy 03/17/2019   Cubital tunnel syndrome 10/14/2018   Closed fracture of distal end of right radius 07/09/2018   Pseudogout 12/09/2017   Major depressive disorder, recurrent, severe without psychotic features ([place]    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   Suicide attempt by drug ingestion ([place]    Medication management 08/22/2014   Vitamin D deficiency 08/22/2014   Hyperlipidemia, mixed 08/22/2014   Hypertension    Anxiety    Abnormal glucose    Class 2 severe obesity due  to excess calories with serious comorbidity and body mass index (BMI) of 37.0 to 37.9 in adult [facility])     .Voncille Lo, PT, [place] Certified Exercise Expert for the Aging Adult  12/19/21 4:22 PM Phone: [phone] Fax: [place]  [facility] [address] [place], [place], [address] Phone: [phone]   Fax:  [phone]  Name: Haley White MRN: [id number] Date of Birth: 12-Jan-1956

## 2021-12-19 ENCOUNTER — Ambulatory Visit: Payer: Managed Care, Other (non HMO) | Admitting: Physical Therapy

## 2021-12-19 ENCOUNTER — Other Ambulatory Visit: Payer: Self-pay

## 2021-12-19 ENCOUNTER — Encounter: Payer: Self-pay | Admitting: Physical Therapy

## 2021-12-19 DIAGNOSIS — M25672 Stiffness of left ankle, not elsewhere classified: Secondary | ICD-10-CM

## 2021-12-19 DIAGNOSIS — R262 Difficulty in walking, not elsewhere classified: Secondary | ICD-10-CM

## 2021-12-19 DIAGNOSIS — Z9889 Other specified postprocedural states: Secondary | ICD-10-CM

## 2021-12-19 DIAGNOSIS — M79672 Pain in left foot: Secondary | ICD-10-CM

## 2021-12-19 DIAGNOSIS — S96919D Strain of unspecified muscle and tendon at ankle and foot level, unspecified foot, subsequent encounter: Secondary | ICD-10-CM

## 2021-12-19 DIAGNOSIS — M6281 Muscle weakness (generalized): Secondary | ICD-10-CM

## 2021-12-20 ENCOUNTER — Ambulatory Visit: Payer: Managed Care, Other (non HMO)

## 2021-12-20 DIAGNOSIS — M79672 Pain in left foot: Secondary | ICD-10-CM

## 2021-12-20 DIAGNOSIS — M25672 Stiffness of left ankle, not elsewhere classified: Secondary | ICD-10-CM

## 2021-12-20 DIAGNOSIS — R262 Difficulty in walking, not elsewhere classified: Secondary | ICD-10-CM

## 2021-12-20 NOTE — Therapy (Signed)
[place], [place], [address] Phone: [phone]   Fax:  [phone]  Physical Therapy Treatment  Patient Details  Name: Haley White MRN: [id number] Date of Birth: [birth year] Referring Provider (PT): Trula Slade, [place]   Encounter Date: 12/20/2021   PT End of Session - 12/20/21 1605     Visit Number 18    Number of Visits 22    Date for PT Re-Evaluation 01/05/22    Authorization Type [employer]    PT Start Time 1606    PT Stop Time 1659   5 minutes TPDN   PT Time Calculation (min) 53 min    Activity Tolerance Patient tolerated treatment well    Behavior During Therapy [facility] for tasks assessed/performed             Past Medical History:  Diagnosis Date   Anxiety    Arthritis    Asthma    Cataract    Gout    Hyperlipidemia    Hypertension    Obese     Past Surgical History:  Procedure Laterality Date   APPENDECTOMY  1984   CATARACT EXTRACTION W/ INTRAOCULAR LENS  IMPLANT, BILATERAL  2014   CESAREAN SECTION  1984, 1987, 1994   X3   COLONOSCOPY  11/16/2013   w/Brodie    FOOT SURGERY Left 2002   KNEE SURGERY Left 1998   ROTATOR CUFF REPAIR Right 2007   TUBAL LIGATION  1994    There were no vitals filed for this visit.   Subjective Assessment - 12/20/21 1608     Subjective "It's not a good day." Patient reports pain along anterior lower leg and ankle that began this morning without known cause. She was hurting a little bit yesterday, but is a lot worse today. She has been icing.    Currently in Pain? Yes    Pain Score 6     Pain Location Leg    Pain Orientation Anterior;Left;Lower    Pain Descriptors / Indicators Sharp;Aching;Dull    Pain Type Chronic pain    Pain Radiating Towards left ankle    Pain Onset More than a month ago    Pain Frequency Intermittent    Aggravating Factors  weight bearing    Pain Relieving Factors rest, ice, elevation                   [facility]  Adult PT Treatment:                                                DATE: 12/20/21 Therapeutic Exercise: Prone pressup on elbows 1 x 10 Prone pressup 1 x 10  Heel/toe raises x 20 in sitting  Manual Therapy: STM/DTM/Trigger point release to Lt gastroc/soleus, peroneals, anterior and posterior tibialis, extensor digitorum Ankle mobilization to improve DF MT A/P glides Great toe PROM flexion/extension Great toe MTP joint mobilizations A/P  Trigger Point Dry Needling Treatment: Pre-treatment instruction: Patient instructed on dry needling rationale, procedures, and possible side effects including pain during treatment (achy,cramping feeling), bruising, drop of blood, lightheadedness, nausea, sweating. Patient Consent Given: Yes Education handout provided: Previously provided Muscles treated: Lt soleus/gastroc, anterior tibialis, peroneal longus   Treatment response/outcome: Twitch response elicited and Palpable decrease in muscle tension Post-treatment instructions: Patient instructed to expect possible mild to moderate muscle soreness later  today and/or tomorrow. Patient instructed in methods to reduce muscle soreness and to continue prescribed HEP. If patient was dry needled over the lung field, patient was instructed on signs and symptoms of pneumothorax and, however unlikely, to see immediate medical attention should they occur. Patient was also educated on signs and symptoms of infection and to seek medical attention should they occur. Patient verbalized understanding of these instructions and education.                          PT Short Term Goals - 11/07/21 1032       PT SHORT TERM GOAL #1   Title Pt will report understanding and adherence to her HEP in order to promote independence in the management of her primary impairments.    Baseline issued at eval.    Time 4    Period Weeks    Status Achieved    Target Date 09/27/21      PT SHORT TERM GOAL #2   Title Pt  will demonstrate ability to tolerate lateral weightshifting in standing in order to transition to walking with less limitation.    Baseline able to weightshift in standing with tennis shoe with mild pain. 11/07/21    Time 4    Period Weeks    Status Achieved               PT Long Term Goals - 11/07/21 1032       PT LONG TERM GOAL #1   Title Pt will achieve a FOTO score of 55% or higher in order to demonstrate improved functional ability as it relates to her L ankle pain.    Baseline see flowsheet    Time 8    Period Weeks    Status On-going    Target Date 10/25/21      PT LONG TERM GOAL #2   Title Pt will demonstrate the ability to perform a 6-minute walk test without CAM boot or AD at self-selected pace in order to progress to safe community ambulation.    Baseline currently WBAT in CAM boot and crutches    Time 8    Period Weeks    Status Unable to assess    Target Date 10/25/21      PT LONG TERM GOAL #3   Title Pt will demonstrate Lt ankle inversion/eversion AROM WNL with 0-2/10 pain in order to put on her socks and shoes without limitation.    Baseline See flowsheet    Time 8    Period Weeks    Status Revised    Target Date 10/25/21      PT LONG TERM GOAL #4   Title Pt will demonstrate BIL global hip strength of 5/5 in order to independently progress her LE strengthening regimen without limitation.    Baseline See flowsheet    Period Weeks    Status Partially Met    Target Date 10/25/21      PT LONG TERM GOAL #5   Title Patient will demonstrate 5/5 Lt ankle strength to improve stability necessary for community ambulation.    Baseline see flowsheet    Time 4    Period Weeks    Status New    Target Date 12/05/21      Additional Long Term Goals   Additional Long Term Goals Yes      PT LONG TERM GOAL #6   Title Patient will maintain SLS on the LLE for  at least 5 seconds to improve stability when walking on uneven terrain.    Baseline unable    Time 8     Period Weeks    Status New    Target Date 01/02/22                   Plan - 12/20/21 1720     Clinical Impression Statement Patient arrives with increased pain reporting that it is coursing from the anterior lower leg to dorsum of the foot. Given pain location repeated movement of the lumbar spine was completed initially to determine if her self-reported history of DDD was involved. No change in pain with lumbar biased movement. With palpapation to the anterior tibialis, peroneals, and soleus she was noted to have multiple trigger points and this elicited her pain pattern, so likely that her pain is myofascial in nature. With TPDN to the above musculature she reported referred pain along this same area with excellent twitch response elicited. She reported soreness post session as expected.    Personal Factors and Comorbidities Comorbidity 3+;Past/Current Experience    Comorbidities OA, HTN, anxety, multiple ankle injuries/ surgeries    Examination-Activity Limitations Carry;Lift;Locomotion Level;Stairs;Stand;Sit;Transfers;Squat    Examination-Participation Restrictions Church;Cleaning;Community Activity;Laundry;Meal Prep;Shop    Rehab Potential Good    PT Frequency 2x / week    PT Duration 8 weeks    PT Treatment/Interventions ADLs/Self Care Home Management;Cryotherapy;Software engineer;Therapeutic activities;Therapeutic exercise;Balance training;Neuromuscular re-education;Patient/family education;Manual techniques;Passive range of motion;Dry needling;Taping;Vasopneumatic Device;Stair training;Aquatic Therapy;Iontophoresis 35m/ml Dexamethasone;Ultrasound    PT Next Visit Plan continue strength, balance, ROM of the ankle    PT Home Exercise Plan Access Code [email or web address] Added aquatics 12/13/21    Consulted and Agree with Plan of Care Patient             Patient will benefit from skilled therapeutic intervention in order to improve the following deficits and  impairments:  Abnormal gait, Decreased range of motion, Difficulty walking, Decreased endurance, Decreased activity tolerance, Pain, Decreased balance, Impaired flexibility, Decreased strength, Increased edema  Visit Diagnosis: Pain in left foot  Difficulty in walking, not elsewhere classified  Stiffness of left ankle, not elsewhere classified     Problem List Patient Active Problem List   Diagnosis Date Noted   Allergic rhinitis 03/15/2021   Allergic rhinitis due to animal (cat) (dog) hair and dander 03/15/2021   Allergic rhinitis due to pollen 03/15/2021   Gastro-esophageal reflux disease without esophagitis 03/15/2021   Mild persistent asthma, uncomplicated 022/97/9892  [place]ortic [place] ([place] on 02/02/2018  01/10/2021   Asthma    Low back pain 03/17/2019   Lumbosacral spondylosis without myelopathy 03/17/2019   Cubital tunnel syndrome 10/14/2018   Closed fracture of distal end of right radius 07/09/2018   Pseudogout 12/09/2017   Major depressive disorder, recurrent, severe without psychotic features ([place]    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

## 2021-12-24 ENCOUNTER — Ambulatory Visit: Payer: Managed Care, Other (non HMO)

## 2021-12-24 ENCOUNTER — Other Ambulatory Visit: Payer: Self-pay

## 2021-12-24 DIAGNOSIS — M79672 Pain in left foot: Secondary | ICD-10-CM | POA: Diagnosis not present

## 2021-12-24 DIAGNOSIS — M25672 Stiffness of left ankle, not elsewhere classified: Secondary | ICD-10-CM

## 2021-12-24 DIAGNOSIS — R262 Difficulty in walking, not elsewhere classified: Secondary | ICD-10-CM

## 2021-12-24 NOTE — Therapy (Signed)
[place], [place], [address] Phone: [phone]   Fax:  [phone]  Physical Therapy Treatment  Patient Details  Name: Haley White MRN: [id number] Date of Birth: 13-May-1956 Referring Provider (PT): Trula Slade, [place]   Encounter Date: 12/24/2021   PT End of Session - 12/24/21 1021     Visit Number 19    Number of Visits 22    Date for PT Re-Evaluation 01/05/22    Authorization Type [employer]    PT Start Time 1021   patient late   PT Stop Time 1101    PT Time Calculation (min) 40 min    Activity Tolerance Patient tolerated treatment well    Behavior During Therapy [facility] for tasks assessed/performed             Past Medical History:  Diagnosis Date   Anxiety    Arthritis    Asthma    Cataract    Gout    Hyperlipidemia    Hypertension    Obese     Past Surgical History:  Procedure Laterality Date   APPENDECTOMY  1984   CATARACT EXTRACTION W/ INTRAOCULAR LENS  IMPLANT, BILATERAL  2014   CESAREAN SECTION  1984, 1987, 1994   X3   COLONOSCOPY  11/16/2013   w/Brodie    FOOT SURGERY Left 2002   KNEE SURGERY Left 1998   ROTATOR CUFF REPAIR Right 2007   TUBAL LIGATION  1994    There were no vitals filed for this visit.   Subjective Assessment - 12/24/21 1024     Subjective Patient reports she is feeling much better than last visit, stating that the manual/needling helped. She reports a little bit of pain currently, but has been utilizing her cane for ambulation. She has f/u with Dr. Jacqualyn Posey tomorrow. She is going on vacation to the beach and has questions in regards to what to wear on her foot.    Currently in Pain? Yes    Pain Score 2     Pain Location Ankle    Pain Orientation Left;Posterior    Pain Descriptors / Indicators Tightness    Pain Type Chronic pain    Pain Onset More than a month ago    Pain Frequency Intermittent    Aggravating Factors  weight bearing    Pain  Relieving Factors sitting                [facility] PT Assessment - 12/24/21 0001       AROM   Left Ankle Inversion 20    Left Ankle Eversion 18   lateral ankle pain          [facility] Adult PT Treatment:                                                DATE: 12/24/21 Therapeutic Exercise: Forward step overs hurdle x 20  Lateral step overs hurdle x 20  Lateral step up on airex 2 x 10  Marching on airex 2 x 10  Leg press 2 x 10 @ 20 lbs  SL calf raise on leg press 2 x 10 @ 20 lbs LLE NMR: Tandem 2 x 30 sec each   Self Care: Discussed appropriate ankle brace for upcoming trip.  PT Short Term Goals - 11/07/21 1032       PT SHORT TERM GOAL #1   Title Pt will report understanding and adherence to her HEP in order to promote independence in the management of her primary impairments.    Baseline issued at eval.    Time 4    Period Weeks    Status Achieved    Target Date 09/27/21      PT SHORT TERM GOAL #2   Title Pt will demonstrate ability to tolerate lateral weightshifting in standing in order to transition to walking with less limitation.    Baseline able to weightshift in standing with tennis shoe with mild pain. 11/07/21    Time 4    Period Weeks    Status Achieved               PT Long Term Goals - 12/24/21 1052       PT LONG TERM GOAL #1   Title Pt will achieve a FOTO score of 55% or higher in order to demonstrate improved functional ability as it relates to her L ankle pain.    Baseline see flowsheet    Time 8    Period Weeks    Status On-going    Target Date 10/25/21      PT LONG TERM GOAL #2   Title Pt will demonstrate the ability to perform a 6-minute walk test without CAM boot or AD at self-selected pace in order to progress to safe community ambulation.    Baseline currently WBAT in CAM boot and crutches    Time 8    Period Weeks    Status Unable to assess    Target Date 10/25/21      PT LONG TERM  GOAL #3   Title Pt will demonstrate Lt ankle inversion/eversion AROM WNL with 0-2/10 pain in order to put on her socks and shoes without limitation.    Baseline See flowsheet    Time 8    Period Weeks    Status Achieved    Target Date 10/25/21      PT LONG TERM GOAL #4   Title Pt will demonstrate BIL global hip strength of 5/5 in order to independently progress her LE strengthening regimen without limitation.    Baseline See flowsheet    Period Weeks    Status Partially Met    Target Date 10/25/21      PT LONG TERM GOAL #5   Title Patient will demonstrate 5/5 Lt ankle strength to improve stability necessary for community ambulation.    Baseline see flowsheet    Time 4    Period Weeks    Status New    Target Date 12/05/21      PT LONG TERM GOAL #6   Title Patient will maintain SLS on the LLE for at least 5 seconds to improve stability when walking on uneven terrain.    Baseline unable    Time 8    Period Weeks    Status New    Target Date 01/02/22                   Plan - 12/24/21 1040     Clinical Impression Statement Patient arrives with mild Lt ankle pain. Session focused on progression of standing strengthening, which she tolerated well without increased pain in the ankle, though occasional Lt knee pain reported. Able to complete standing activity on unstable surface with patient demonstrating fair postural control during  SL stance on the LLE requiring occasional use of UE support. She demonstrates normalized ankle inversion and eversion AROM at this time reporting mild lateral ankle pain during eversion ROM, having met this long term goal.    Personal Factors and Comorbidities Comorbidity 3+;Past/Current Experience    Comorbidities OA, HTN, anxety, multiple ankle injuries/ surgeries    Examination-Activity Limitations Carry;Lift;Locomotion Level;Stairs;Stand;Sit;Transfers;Squat    Examination-Participation Restrictions Church;Cleaning;Community  Activity;Laundry;Meal Prep;Shop    Rehab Potential Good    PT Frequency 2x / week    PT Duration 8 weeks    PT Treatment/Interventions ADLs/Self Care Home Management;Cryotherapy;Software engineer;Therapeutic activities;Therapeutic exercise;Balance training;Neuromuscular re-education;Patient/family education;Manual techniques;Passive range of motion;Dry needling;Taping;Vasopneumatic Device;Stair training;Aquatic Therapy;Iontophoresis 63m/ml Dexamethasone;Ultrasound    PT Next Visit Plan continue strength, balance, ROM of the ankle    PT Home Exercise Plan Access Code [email or web address] Added aquatics 12/13/21    Consulted and Agree with Plan of Care Patient             Patient will benefit from skilled therapeutic intervention in order to improve the following deficits and impairments:  Abnormal gait, Decreased range of motion, Difficulty walking, Decreased endurance, Decreased activity tolerance, Pain, Decreased balance, Impaired flexibility, Decreased strength, Increased edema  Visit Diagnosis: Pain in left foot  Difficulty in walking, not elsewhere classified  Stiffness of left ankle, not elsewhere classified     Problem List Patient Active Problem List   Diagnosis Date Noted   Allergic rhinitis 03/15/2021   Allergic rhinitis due to animal (cat) (dog) hair and dander 03/15/2021   Allergic rhinitis due to pollen 03/15/2021   Gastro-esophageal reflux disease without esophagitis 03/15/2021   Mild persistent asthma, uncomplicated 078/97/8478  [place]ortic [place] ([place] Chest [place] scan on 02/02/2018  01/10/2021   Asthma    Low back pain 03/17/2019   Lumbosacral spondylosis without myelopathy 03/17/2019   Cubital tunnel syndrome 10/14/2018   Closed fracture of distal end of right radius 07/09/2018   Pseudogout 12/09/2017   Major depressive disorder, recurrent, severe without psychotic features ([place]    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

## 2021-12-25 ENCOUNTER — Ambulatory Visit: Payer: Managed Care, Other (non HMO) | Admitting: Podiatry

## 2021-12-25 ENCOUNTER — Encounter: Payer: Self-pay | Admitting: Podiatry

## 2021-12-25 ENCOUNTER — Other Ambulatory Visit: Payer: Self-pay | Admitting: Podiatry

## 2021-12-25 DIAGNOSIS — S96919D Strain of unspecified muscle and tendon at ankle and foot level, unspecified foot, subsequent encounter: Secondary | ICD-10-CM

## 2021-12-25 DIAGNOSIS — S92002D Unspecified fracture of left calcaneus, subsequent encounter for fracture with routine healing: Secondary | ICD-10-CM | POA: Diagnosis not present

## 2021-12-25 DIAGNOSIS — M7672 Peroneal tendinitis, left leg: Secondary | ICD-10-CM | POA: Diagnosis not present

## 2021-12-25 DIAGNOSIS — M25561 Pain in right knee: Secondary | ICD-10-CM

## 2021-12-25 NOTE — Therapy (Signed)
[place], [place], [address] Phone: [phone]   Fax:  [phone]  Physical Therapy Treatment  Patient Details  Name: Haley White MRN: [id number] Date of Birth: 04-18-1956 Referring Provider (PT): Trula Slade, [place]   Encounter Date: 12/26/2021   PT End of Session - 12/26/21 1421     Visit Number 20    Number of Visits 22    Date for PT Re-Evaluation 01/05/22    Authorization Type [employer]    Authorization - Visit Number 30    Authorization - Number of Visits 30    PT Start Time 9169    PT Stop Time 1459    PT Time Calculation (min) 43 min    Activity Tolerance Patient tolerated treatment well    Behavior During Therapy [facility] for tasks assessed/performed              Past Medical History:  Diagnosis Date   Anxiety    Arthritis    Asthma    Cataract    Gout    Hyperlipidemia    Hypertension    Obese     Past Surgical History:  Procedure Laterality Date   APPENDECTOMY  1984   CATARACT EXTRACTION W/ INTRAOCULAR LENS  IMPLANT, BILATERAL  2014   CESAREAN SECTION  1984, 1987, 1994   X3   COLONOSCOPY  11/16/2013   w/Brodie    FOOT SURGERY Left 2002   KNEE SURGERY Left 1998   ROTATOR CUFF REPAIR Right 2007   TUBAL LIGATION  1994    There were no vitals filed for this visit. [facility] Adult PT Treatment:                                                DATE: 12-26-21   Subjective Assessment - 12/26/21 1748     Pertinent History two L ankle surgeries within 8 months; HTN; anxiety; global OA    Limitations Standing;Walking;House hold activities;Lifting;Sitting    Diagnostic tests 06/06/2021: MRI L Ankle Without Contrast: IMPRESSION:  1. 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  Long segment longitudinal split tear of the peroneus brevis tendon.  Peroneus longus tendon is laterally subluxed.  2. Chronic Achilles tendinopathy with small partial-thickness  interstitial  tears distally, similar to prior.  3. 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    Patient Stated Goals Walking, household chores    Currently in Pain? Yes    Pain Score 1     Pain Location Ankle    Pain Orientation Left;Posterior    Pain Descriptors / Indicators Tightness    Pain Type Chronic pain    Pain Onset More than a month ago            Pt seen for aquatic therapy today.  Treatment took place in water 3.25-4.8 ft in depth at the [employer] pool. Temp of water was 90.  Pt entered/exited the pool via stairs  with alternating step  pattern independently with bilat  hand rail.   Walking in 3 ft  depth water,   forward, back and side stepping 6-8 widths each utilizing aquatic barbells.  VC for increased foot propulsion through Left great toe and increased stride length Seated: flutter kicking with concentration on DF/PF with each stride 5x25  reps added IN/EV as pt tolerated with PT providing resistance showing good AROM Gastroc Aqua stretch on R Toe yoga standing Left great toe distraction and mobilization to increase Toe Extension Runners stretch on R and L 30 sec x 2 and then stretch in to runners hamstring stretch 2 x 30 sec R and L SL squat with UE support   STS from submerged step , pt mindful of trunk lean this session. Pt requires verbal and tactile cues and demonstration  Hip abd/add R/Leach and then using 1 UE support Hip ext/flex with knee straight  pt needing VC and TC for correct execution and DF and PF of Left ankle Ham curl R/L  Standing gastroc stretch with great toe flexed at pool wall R and L  Squats    Balance: SLS R/L 3x30 sec standing on pool noodle balance with rocker forward and backward for uneven stepping SLS and UE transfer of aquatic DB for self perturbation.   Pt mindful of foot tripod and need to bear weight for stability        Pt requires buoyancy for support and to offload joints  with strengthening exercises. Viscosity of the water is needed for resistance of strengthening; water current perturbations provides challenge to standing balance unsupported, requiring increased core activation.     [facility] Adult PT Treatment:                                                DATE: 12/24/21 Therapeutic Exercise: Forward step overs hurdle x 20  Lateral step overs hurdle x 20  Lateral step up on airex 2 x 10  Marching on airex 2 x 10  Leg press 2 x 10 @ 20 lbs  SL calf raise on leg press 2 x 10 @ 20 lbs LLE NMR: Tandem 2 x 30 sec each   Self Care: Discussed appropriate ankle brace for upcoming trip.                             PT Short Term Goals - 11/07/21 1032       PT SHORT TERM GOAL #1   Title Pt will report understanding and adherence to her HEP in order to promote independence in the management of her primary impairments.    Baseline issued at eval.    Time 4    Period Weeks    Status Achieved    Target Date 09/27/21      PT SHORT TERM GOAL #2   Title Pt will demonstrate ability to tolerate lateral weightshifting in standing in order to transition to walking with less limitation.    Baseline able to weightshift in standing with tennis shoe with mild pain. 11/07/21    Time 4    Period Weeks    Status Achieved               PT Long Term Goals - 12/24/21 1052       PT LONG TERM GOAL #1   Title Pt will achieve a FOTO score of 55% or higher in order to demonstrate improved functional ability as it relates to her L ankle pain.    Baseline see flowsheet    Time 8    Period Weeks    Status On-going    Target Date 10/25/21  PT LONG TERM GOAL #2   Title Pt will demonstrate the ability to perform a 6-minute walk test without CAM boot or AD at self-selected pace in order to progress to safe community ambulation.    Baseline currently WBAT in CAM boot and crutches    Time 8    Period Weeks    Status Unable to assess    Target  Date 10/25/21      PT LONG TERM GOAL #3   Title Pt will demonstrate Lt ankle inversion/eversion AROM WNL with 0-2/10 pain in order to put on her socks and shoes without limitation.    Baseline See flowsheet    Time 8    Period Weeks    Status Achieved    Target Date 10/25/21      PT LONG TERM GOAL #4   Title Pt will demonstrate BIL global hip strength of 5/5 in order to independently progress her LE strengthening regimen without limitation.    Baseline See flowsheet    Period Weeks    Status Partially Met    Target Date 10/25/21      PT LONG TERM GOAL #5   Title Patient will demonstrate 5/5 Lt ankle strength to improve stability necessary for community ambulation.    Baseline see flowsheet    Time 4    Period Weeks    Status New    Target Date 12/05/21      PT LONG TERM GOAL #6   Title Patient will maintain SLS on the LLE for at least 5 seconds to improve stability when walking on uneven terrain.    Baseline unable    Time 8    Period Weeks    Status New    Target Date 01/02/22                   Plan - 12/26/21 1747     Clinical Impression Statement Pt arrived to aquatics with 1/10 pain in ankle. Pt does walk with greater AROM in Great toe for push off in gait.  In water, pt tolerating resisted IN/ER WNL.  Ms Miltenberger states she did hurt after aquatics last visit but she does see she is able to move ankle with better range. Pt will continue to benefit form aquatics to exercises in pain free range and utilize viscosity and drag for resistance benefit.  Pt requires buoyancy for support and to offload joints with strengthening exercises. Viscosity of the water is needed for resistance of strengthening; water current perturbations provides challenge to standing balance unsupported, requiring increased core activation.    Personal Factors and Comorbidities Comorbidity 3+;Past/Current Experience    Comorbidities OA, HTN, anxety, multiple ankle injuries/ surgeries     Examination-Activity Limitations Carry;Lift;Locomotion Level;Stairs;Stand;Sit;Transfers;Squat    Examination-Participation Restrictions Church;Cleaning;Community Activity;Laundry;Meal Prep;Shop    PT Frequency 2x / week    PT Duration 8 weeks    PT Treatment/Interventions ADLs/Self Care Home Management;Cryotherapy;Software engineer;Therapeutic activities;Therapeutic exercise;Balance training;Neuromuscular re-education;Patient/family education;Manual techniques;Passive range of motion;Dry needling;Taping;Vasopneumatic Device;Stair training;Aquatic Therapy;Iontophoresis 15m/ml Dexamethasone;Ultrasound    PT Next Visit Plan continue strength, balance, ROM of the ankle    PT Home Exercise Plan Access Code [email or web address] Added aquatics 12/13/21    Consulted and Agree with Plan of Care Patient              Patient will benefit from skilled therapeutic intervention in order to improve the following deficits and impairments:  Abnormal gait, Decreased range of motion, Difficulty walking, Decreased endurance, Decreased  activity tolerance, Pain, Decreased balance, Impaired flexibility, Decreased strength, Increased edema  Visit Diagnosis: Pain in left foot  Difficulty in walking, not elsewhere classified  Stiffness of left ankle, not elsewhere classified  Tendon tear, ankle, unspecified laterality, subsequent encounter  Status post left foot surgery  Muscle weakness (generalized)     Problem List Patient Active Problem List   Diagnosis Date Noted   Allergic rhinitis 03/15/2021   Allergic rhinitis due to animal (cat) (dog) hair and dander 03/15/2021   Allergic rhinitis due to pollen 03/15/2021   Gastro-esophageal reflux disease without esophagitis 03/15/2021   Mild persistent asthma, uncomplicated 30/10/3798   Aortic atherosclerosis ([place]) by Chest CT scan on 02/02/2018  01/10/2021   Asthma    Low back pain 03/17/2019   Lumbosacral spondylosis without myelopathy  03/17/2019   Cubital tunnel syndrome 10/14/2018   Closed fracture of distal end of right radius 07/09/2018   Pseudogout 12/09/2017   Major depressive disorder, recurrent, severe without psychotic features ([place])    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   Suicide attempt by drug ingestion ([place])    Medication management 08/22/2014   Vitamin D deficiency 08/22/2014   Hyperlipidemia, mixed 08/22/2014   Hypertension    Anxiety    Abnormal glucose    Class 2 severe obesity due to excess calories with serious comorbidity and body mass index (BMI) of 37.0 to 37.9 in adult ([place])    Voncille Lo, PT, [place] Certified Exercise Expert for the Aging Adult  12/26/21 5:52 PM Phone: [phone] Fax: [place] [facility] [address] [place], [place], [address] Phone: [phone]   Fax:  [phone]  Name: Haley White MRN: [id number] Date of Birth: November 29, 1955

## 2021-12-25 NOTE — Patient Instructions (Signed)
For instructions on how to put on your Tri-Lock Ankle Brace, please visit [email or web address] 

## 2021-12-25 NOTE — Telephone Encounter (Signed)
I put in for a referral to [facility] for knee pain. Can you please fax it to them? Thanks!

## 2021-12-26 ENCOUNTER — Other Ambulatory Visit: Payer: Self-pay

## 2021-12-26 ENCOUNTER — Ambulatory Visit: Payer: Managed Care, Other (non HMO) | Admitting: Physical Therapy

## 2021-12-26 ENCOUNTER — Encounter: Payer: Self-pay | Admitting: Physical Therapy

## 2021-12-26 DIAGNOSIS — M25672 Stiffness of left ankle, not elsewhere classified: Secondary | ICD-10-CM

## 2021-12-26 DIAGNOSIS — R262 Difficulty in walking, not elsewhere classified: Secondary | ICD-10-CM

## 2021-12-26 DIAGNOSIS — M79672 Pain in left foot: Secondary | ICD-10-CM | POA: Diagnosis not present

## 2021-12-26 DIAGNOSIS — M6281 Muscle weakness (generalized): Secondary | ICD-10-CM

## 2021-12-26 DIAGNOSIS — S96919D Strain of unspecified muscle and tendon at ankle and foot level, unspecified foot, subsequent encounter: Secondary | ICD-10-CM

## 2021-12-26 DIAGNOSIS — Z9889 Other specified postprocedural states: Secondary | ICD-10-CM

## 2021-12-29 NOTE — Progress Notes (Signed)
Subjective: Haley White is a 66 y.o. is seen today in office s/p left peroneal tendon repair, anastomosis subsequently she had a fall and had a fracture posterior calcaneus.  She has been making good progress and she is continue with physical therapy.  She is walking today when regular shoes with the use of a cane and she is hoping to get off the cane soon.  She does feel her symptoms are improving.  No recent injury or changes or recent falls.  No fevers or chills.  No other concerns.   Objective: General: No acute distress, AAOx3  DP/PT pulses palpable 2/4, CRT < 3 sec to all digits.  Protective sensation intact. Motor function intact.  Left foot: Incision site along the peroneal tendon has healed with a scar.  There is mild discomfort noted along the course of the peroneal tendon but is more of a numbing sensation.  There is minimal edema but overall the swelling appears to be improved.  Flexor, extensor tendons clinically appear to be intact.  No area of pinpoint tenderness.  Achilles tendon intact.  MMT 5/5. No pain with calf compression, swelling, warmth, erythema.   MRI 09/08/2021 IMPRESSION: 1. Insufficiency fracture of the calcaneal tuberosity superiorly, near the Achilles insertion, as can be seen associated with diabetes. No displaced fracture or Achilles tendon tear identified. 2. Postsurgical changes related to repair of the previously demonstrated longitudinal split tear of the peroneus brevis tendon. No new tendon findings. 3. Stable subchondral cysts in the lateral talar dome and midfoot degenerative changes. No other acute osseous findings.  Assessment and Plan:  Status post left peroneal tendon anastomosis; calcaneal fracture  -Treatment options discussed including all alternatives, risks, and complications -Overall she is making good progress.  We will continue physical therapy as well as aquatic therapy.  Continue with supportive shoe gear.  She is going to be going on  vacation going to the beach and we discussed walking in the sand.  I dispensed a Tri-Lock ankle brace to use if needed.  Continue physical therapy for now, ice and elevation.  Trula Slade DPM

## 2022-01-02 ENCOUNTER — Ambulatory Visit: Payer: Medicare Other | Admitting: Physical Therapy

## 2022-01-07 ENCOUNTER — Ambulatory Visit: Payer: Medicare Other | Attending: Podiatry

## 2022-01-07 ENCOUNTER — Other Ambulatory Visit: Payer: Self-pay

## 2022-01-07 DIAGNOSIS — M25672 Stiffness of left ankle, not elsewhere classified: Secondary | ICD-10-CM | POA: Insufficient documentation

## 2022-01-07 DIAGNOSIS — M79672 Pain in left foot: Secondary | ICD-10-CM | POA: Diagnosis not present

## 2022-01-07 DIAGNOSIS — S96919D Strain of unspecified muscle and tendon at ankle and foot level, unspecified foot, subsequent encounter: Secondary | ICD-10-CM | POA: Diagnosis present

## 2022-01-07 DIAGNOSIS — M6281 Muscle weakness (generalized): Secondary | ICD-10-CM | POA: Diagnosis present

## 2022-01-07 DIAGNOSIS — R262 Difficulty in walking, not elsewhere classified: Secondary | ICD-10-CM | POA: Diagnosis present

## 2022-01-07 DIAGNOSIS — Z9889 Other specified postprocedural states: Secondary | ICD-10-CM | POA: Insufficient documentation

## 2022-01-07 NOTE — Therapy (Signed)
OUTPATIENT PHYSICAL THERAPY TREATMENT NOTE/RE-CERTIFICATION   Patient Name: Haley White MRN: [id number] DOB:02/14/1956, 66 y.o., female Today's Date: 01/07/2022   PT End of Session - 01/07/22 0933     Visit Number 21    Number of Visits 17    Date for PT Re-Evaluation 02/23/22    Authorization Type MCR    Progress Note Due on Visit 26    PT Start Time 0933    PT Stop Time 5631    PT Time Calculation (min) 42 min    Activity Tolerance Patient tolerated treatment well    Behavior During Therapy WFL for tasks assessed/performed             Past Medical History:  Diagnosis Date   Anxiety    Arthritis    Asthma    Cataract    Gout    Hyperlipidemia    Hypertension    Obese    Past Surgical History:  Procedure Laterality Date   APPENDECTOMY  1984   CATARACT EXTRACTION W/ INTRAOCULAR LENS  IMPLANT, BILATERAL  2014   CESAREAN SECTION  1984, 1987, 1994   X3   COLONOSCOPY  11/16/2013   w/Brodie    FOOT SURGERY Left 2002   KNEE SURGERY Left 1998   ROTATOR CUFF REPAIR Right 2007   [place]   Patient Active Problem List   Diagnosis Date Noted   Allergic rhinitis 03/15/2021   Allergic rhinitis due to animal (cat) (dog) hair and dander 03/15/2021   Allergic rhinitis due to pollen 03/15/2021   Gastro-esophageal reflux disease without esophagitis 03/15/2021   Mild persistent asthma, uncomplicated 49/70/2637   Aortic atherosclerosis ([place]) by Chest CT scan on 02/02/2018  01/10/2021   Asthma    Low back pain 03/17/2019   Lumbosacral spondylosis without myelopathy 03/17/2019   Cubital tunnel syndrome 10/14/2018   Closed fracture of distal end of right radius 07/09/2018   Pseudogout 12/09/2017   Major depressive disorder, recurrent, severe without psychotic features ([place])    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   Suicide attempt by drug ingestion ([place])    Medication management 08/22/2014   Vitamin D deficiency 08/22/2014   Hyperlipidemia, mixed 08/22/2014    Hypertension    Anxiety    Abnormal glucose    Class 2 severe obesity due to excess calories with serious comorbidity and body mass index (BMI) of 37.0 to 37.9 in adult [facility])     PCP: Unk Pinto, MD  REFERRING PROVIDER: Trula Slade, DPM  REFERRING DIAG: [phone] (ICD-10-CM) - Tendon tear, ankle, subsequent encounter  THERAPY DIAG:  Pain in left foot  Difficulty in walking, not elsewhere classified  Stiffness of left ankle, not elsewhere classified  Muscle weakness (generalized)    SUBJECTIVE:   SUBJECTIVE STATEMENT: Patient reports she is doing "great." She reports subjective overall improvement of 75%. She reports occasional sharp pain along the lateral ankle with unknown aggravating factor. She reports continued difficulty with stair descent as she is only able to complete step to pattern at this time. She reports overall improvements in walking and balance. She has f/u with Dr. Jacqualyn Posey at the end of March.   PERTINENT HISTORY: two Lt ankle surgeries within 8 months; HTN; anxiety; global OA  PAIN:  Are you having pain? No NPRS scale: 0/10 NPRS scale at worst: 3/10  Pain location: Lt lateral ankle  PAIN TYPE: sharp, shooting Pain description: intermittent  Aggravating factors: unknown Relieving factors: unknown   PRECAUTIONS: None  WEIGHT BEARING RESTRICTIONS  No    OBJECTIVE:   IMAGING:  06/06/2021: MRI L Ankle Without Contrast: IMPRESSION: 1. 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 Long segment longitudinal split tear of the peroneus brevis tendon. Peroneus longus tendon is laterally subluxed. 2. Chronic Achilles tendinopathy with small partial-thickness interstitial tears distally, similar to prior. 3. Moderate arthropathy within the midfoot and TMT joints, not significantly progressed from prior.  PATIENT SURVEYS:  FOTO 52%  PALPATION: Diffuse tenderness about Lt lateral ankle  LE AROM/PROM:  A/PROM Right 01/07/2022  Left 01/07/2022  Hip flexion    Hip extension    Hip abduction    Hip adduction    Hip internal rotation    Hip external rotation    Knee flexion    Knee extension    Ankle dorsiflexion  WNL  Ankle plantarflexion  WNL  Ankle inversion  WNL  Ankle eversion  WNL mild pn    (Blank rows = not tested)  LE MMT:  MMT Right 01/07/2022 Left 01/07/2022  Hip flexion 5 5  Hip extension 4+ 4+  Hip abduction 4+ 4+  Hip adduction    Hip internal rotation    Hip external rotation    Knee flexion    Knee extension    Ankle dorsiflexion  5  Ankle plantarflexion 25 SL calf raises  Unable to complete SL calf raise   Ankle inversion  4+/5 pn  Ankle eversion  4+/5 pn   (Blank rows = not tested)  FUNCTIONAL TESTS:  6 MWT: 1130 ft; 4/10 pain Lt lateral ankle SLS: 5 seconds LLE; 30 seconds RLE   GAIT: Distance walked: 6 MWT Assistive device utilized: None Level of assistance: Complete Independence Comments: no push off LLE, maintains Lt foot ER     TODAY'S TREATMENT: [facility] Adult PT Treatment:                                                DATE: 01/07/22 Therapeutic Exercise: Leg press 3 x 10 @ 20 lbs  SL calf raise on leg press attempted; discontinued due to pain 6 minute walk  Seated calf raise 3 x 10 @ 10 lbs LLE   Therapeutic Activity: Re-assessment to determine overall progress, educating patient on progressing towards goals and updated POC.      PATIENT EDUCATION:  Education details: see treatment above Person educated: Patient Education method: Explanation Education comprehension: verbalized understanding   HOME EXERCISE PROGRAM: Access Code [place] Added aquatics 12/13/21  ASSESSMENT:  CLINICAL IMPRESSION: Haley White is progressing well since the start of care after sustaining an insufficiency fracture of the calcaneus from a fall off of her scooter on 09/01/21 following her surgical procedure that took place on 06/27/21. She demonstrates normalized Lt ankle AROM and  improvements in Lt ankle strength and balance compared to previous re-evaluation. She has most notable weakness remaining in plantarflexors as she is unable to perform calf raise on the LLE at this time, which is likely a factor in her lingering gait abnormalities. Her static SL balance has much improved, though large discrepancy remains compared to the RLE. She will benefit from continued skilled PT to address lingering strength, balance, and gait deficits in order to optimize her function.    OBJECTIVE IMPAIRMENTS Abnormal gait, decreased activity tolerance, decreased balance, difficulty walking, decreased strength, and pain.    GOALS:   SHORT TERM  GOALS:  Pt will report understanding and adherence to her HEP in order to promote independence in the management of her primary impairments.  Target date:  09/27/21 Goal status: MET  2.  Pt will demonstrate ability to tolerate lateral weightshifting in standing in order to transition to walking with less limitation.  Target date: 09/27/21 Goal status: MET   LONG TERM GOALS:  Pt will achieve a FOTO score of 55% or higher in order to demonstrate improved functional ability as it relates to her L ankle pain. Baseline: 43 Target date:  10/25/21 Goal status: IN PROGRESS  2.  Pt will demonstrate the ability to perform a 6-minute walk test without CAM boot or AD at self-selected pace in order to progress to safe community ambulation. Baseline: unable Target date: 10/25/21 Goal status: MET  3.  Pt will demonstrate Lt ankle inversion/eversion AROM WNL with 0-2/10 pain in order to put on her socks and shoes without limitation. Baseline: 18 inversion; 4 eversion  Target date: 10/25/21 Goal status: MET  4.  Pt will demonstrate BIL global hip strength of 5/5 in order to independently progress her LE strengthening regimen without limitation. Baseline: 4- to 5/5  Target date: 10/25/21 Goal status: partially met   5.  Patient will demonstrate  5/5 Lt ankle inversion and eversion strength and 3/5 Lt ankle plantarflexion strength to improve stability necessary for community ambulation. Baseline: MMT deferred secondary to post-op acuity  Target date: 12/05/21 Goal status: revised   6.  Patient will maintain SLS on the LLE for at least 5 seconds to improve stability when walking on uneven terrain. Baseline: unable Target date: 01/02/22 Goal status: MET  7.  Patient will be able to descend stairs utilizing reciprocal pattern Baseline: step-to  Target date: 02/18/22 Goal status: new  PLAN: PT FREQUENCY: 2x/week  PT DURATION: other: 4-6 weeks  PLANNED INTERVENTIONS: Therapeutic exercises, Therapeutic activity, Neuromuscular re-education, Balance training, Gait training, Patient/Family education, Joint mobilization, Stair training, Aquatic Therapy, Dry Needling, Electrical stimulation, Cryotherapy, Moist heat, Taping, Vasopneumatic device, and Manual therapy  PLAN FOR NEXT SESSION: progress Lt ankle strength  and balance as tolerated   Gwendolyn Grant, PT, DPT, ATC 01/07/22 11:01 AM

## 2022-01-08 NOTE — Therapy (Signed)
OUTPATIENT PHYSICAL THERAPY TREATMENT NOTE/RE-CERTIFICATION   Patient Name: Haley White MRN: [id number] DOB:[birth year], 66 y.o., female Today's Date: 01/09/2022   PT End of Session - 01/09/22 2109     Visit Number 22    Number of Visits 33    Date for PT Re-Evaluation 02/23/22    Authorization Type MCR    Authorization - Visit Number 49    Authorization - Number of Visits 30    Progress Note Due on Visit 31    PT Start Time 1500    PT Stop Time 9373    PT Time Calculation (min) 44 min    Activity Tolerance Patient tolerated treatment well    Behavior During Therapy WFL for tasks assessed/performed              Past Medical History:  Diagnosis Date   Anxiety    Arthritis    Asthma    Cataract    Gout    Hyperlipidemia    Hypertension    Obese    Past Surgical History:  Procedure Laterality Date   APPENDECTOMY  1984   CATARACT EXTRACTION W/ INTRAOCULAR LENS  IMPLANT, BILATERAL  2014   CESAREAN SECTION  1984, 1987, 1994   X3   COLONOSCOPY  11/16/2013   w/Brodie    FOOT SURGERY Left 2002   KNEE SURGERY Left 1998   ROTATOR CUFF REPAIR Right 2007   [place]   Patient Active Problem List   Diagnosis Date Noted   Allergic rhinitis 03/15/2021   Allergic rhinitis due to animal (cat) (dog) hair and dander 03/15/2021   Allergic rhinitis due to pollen 03/15/2021   Gastro-esophageal reflux disease without esophagitis 03/15/2021   Mild persistent asthma, uncomplicated 42/87/6811   Aortic atherosclerosis ([place]) by Chest CT scan on 02/02/2018  01/10/2021   Asthma    Low back pain 03/17/2019   Lumbosacral spondylosis without myelopathy 03/17/2019   Cubital tunnel syndrome 10/14/2018   Closed fracture of distal end of right radius 07/09/2018   Pseudogout 12/09/2017   Major depressive disorder, recurrent, severe without psychotic features ([place])    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   Suicide attempt by drug ingestion ([place])    Medication management  08/22/2014   Vitamin D deficiency 08/22/2014   Hyperlipidemia, mixed 08/22/2014   Hypertension    Anxiety    Abnormal glucose    Class 2 severe obesity due to excess calories with serious comorbidity and body mass index (BMI) of 37.0 to 37.9 in adult [facility])     PCP: Unk Pinto, MD  REFERRING PROVIDER: Trula Slade, DPM  REFERRING DIAG: [phone] (ICD-10-CM) - Tendon tear, ankle, subsequent encounter  THERAPY DIAG:  Pain in left foot  Muscle weakness (generalized)  Difficulty in walking, not elsewhere classified  Tendon tear, ankle, unspecified laterality, subsequent encounter  Stiffness of left ankle, not elsewhere classified  Status post left foot surgery    SUBJECTIVE:   SUBJECTIVE STATEMENT: Pt is feeling great after vacation to [place] in which she swam and walked on the beach with ankle brace but did admit to getting her feet in the sand a time or two.  Pt was able to flutter kick in the water.  She presently has no pain in her Left ankle.   PERTINENT HISTORY: two Lt ankle surgeries within 8 months; HTN; anxiety; global OA  PAIN:  Are you having pain? No NPRS scale: 0/10 NPRS scale at worst: 3/10  Pain location: Lt lateral ankle  PAIN  TYPE: sharp, shooting Pain description: intermittent  Aggravating factors: unknown Relieving factors: unknown   PRECAUTIONS: None  WEIGHT BEARING RESTRICTIONS No    OBJECTIVE:   IMAGING:  06/06/2021: MRI L Ankle Without Contrast: IMPRESSION: 1. 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 Long segment longitudinal split tear of the peroneus brevis tendon. Peroneus longus tendon is laterally subluxed. 2. Chronic Achilles tendinopathy with small partial-thickness interstitial tears distally, similar to prior. 3. Moderate arthropathy within the midfoot and TMT joints, not significantly progressed from prior.  PATIENT SURVEYS:  FOTO 52%  PALPATION: Diffuse tenderness about Lt lateral ankle  LE  AROM/PROM:  A/PROM Right 01/09/2022 Left 01/09/2022  Hip flexion    Hip extension    Hip abduction    Hip adduction    Hip internal rotation    Hip external rotation    Knee flexion    Knee extension    Ankle dorsiflexion  WNL  Ankle plantarflexion  WNL  Ankle inversion  WNL  Ankle eversion  WNL mild pn    (Blank rows = not tested)  LE MMT:  MMT Right 01/09/2022 Left 01/09/2022  Hip flexion 5 5  Hip extension 4+ 4+  Hip abduction 4+ 4+  Hip adduction    Hip internal rotation    Hip external rotation    Knee flexion    Knee extension    Ankle dorsiflexion  5  Ankle plantarflexion 25 SL calf raises  Unable to complete SL calf raise   Ankle inversion  4+/5 pn  Ankle eversion  4+/5 pn   (Blank rows = not tested)  FUNCTIONAL TESTS:  6 MWT: 1130 ft; 4/10 pain Lt lateral ankle SLS: 5 seconds LLE; 30 seconds RLE   GAIT: Distance walked: 6 MWT Assistive device utilized: None Level of assistance: Complete Independence Comments: no push off LLE, maintains Lt foot ER     TODAY'S TREATMENT:  [facility] Adult PT Treatment:                                                DATE: 01-09-22 Aquatics Pt seen for aquatic therapy today.  Treatment took place in water 3.25-4.8 ft in depth at the [employer] [facility] pool. Temp of water was 94.  Pt entered/exited the pool via stairs  step to pattern independently with bilat rail   Walking forward, back and side stepping 8 widths each utilizing aquatic barbells Seated: flutter kicking with concentration on DF/PF with each stride 5x25 reps added IN/EV as pt tolerated easily taking on additional resistance this visit    STS from 3 step. Increased trunk lean to and wt shift to R Pt requires verbal and tactile cues and demonstration  Hip abd/add R/Leach and then using 1 UE support Hip ext/flex with knee straight  pt needing VC and TC for correct execution and sequencing Figure 4 squat stretch with UE support for R and L x 60 sec each  Marching  knee/hip 90/90  Standing gastroc stretch with great toe flexed at pool wall R and L  Squats  Gastroc stretch bil on steps Runners stretch on R and L 30 sec x 2 and then stretch in to runners hamstring stretch 2 x 30 sec R and L Ascend/Descend stairs in water to 3rd step with alternating step pattern 6 times Submerged step step ups x 15 on L and  R and with curtsy lunge L and R Supine abdominal hollowing with UE yellow barbells submerged.  Supine to prone abdominal engagement with extended legs alternating back and forth Treading water for 1 min x 3 Pt flutter kicking in corner or pool Heel raise with eccentric lowering of left     Balance: SLS R/L 3x30 sec standing on yellow pool noodle balance with rocker forward and backward for uneven stepping   Pt requires buoyancy for support and to offload joints with strengthening exercises. Viscosity of the water is needed for resistance of strengthening; water current perturbations provides challenge to standing balance unsupported, requiring increased core activation.     [facility] Adult PT Treatment:                                                DATE: 01/07/22 Therapeutic Exercise: Leg press 3 x 10 @ 20 lbs  SL calf raise on leg press attempted; discontinued due to pain 6 minute walk  Seated calf raise 3 x 10 @ 10 lbs LLE   Therapeutic Activity: Re-assessment to determine overall progress, educating patient on progressing towards goals and updated POC.      PATIENT EDUCATION:  Education details: see treatment above Person educated: Patient Education method: Explanation Education comprehension: verbalized understanding   HOME EXERCISE PROGRAM: Access Code [place] Added aquatics 12/13/21 gave additional handout for aquatics  ASSESSMENT:  CLINICAL IMPRESSION: Haley White is progressing well since the start of care after sustaining an insufficiency fracture of the calcaneus from a fall off of her scooter on 09/01/21 following her surgical  procedure that took place on 06/27/21. , which is likely a factor in her lingering gait abnormalities. Haley White enjoyed her vacation and spent daily time in the water at a pool and practiced exercises using her feet for flutter kicking and performing swimming strokes and associated kicking with no pain.  Pt exhibited better use of ankles in water for heel raises but still exhibits weakness in Left plantarflexors/evertors  Viscosity of the water is needed for resistance of strengthening; water current perturbations provides challenge to standing balance unsupported, requiring increased core activation.  OBJECTIVE IMPAIRMENTS Abnormal gait, decreased activity tolerance, decreased balance, difficulty walking, decreased strength, and pain.    GOALS:   SHORT TERM GOALS:  Pt will report understanding and adherence to her HEP in order to promote independence in the management of her primary impairments.  Target date:  09/27/21 Goal status: MET  2.  Pt will demonstrate ability to tolerate lateral weightshifting in standing in order to transition to walking with less limitation.  Target date: 09/27/21 Goal status: MET   LONG TERM GOALS:  Pt will achieve a FOTO score of 55% or higher in order to demonstrate improved functional ability as it relates to her L ankle pain. Baseline: 43 Target date:  10/25/21 Goal status: IN PROGRESS  2.  Pt will demonstrate the ability to perform a 6-minute walk test without CAM boot or AD at self-selected pace in order to progress to safe community ambulation. Baseline: unable Target date: 10/25/21 Goal status: MET  3.  Pt will demonstrate Lt ankle inversion/eversion AROM WNL with 0-2/10 pain in order to put on her socks and shoes without limitation. Baseline: 18 inversion; 4 eversion  Target date: 10/25/21 Goal status: MET  4.  Pt will demonstrate BIL global hip strength of 5/5  in order to independently progress her LE strengthening regimen without  limitation. Baseline: 4- to 5/5  Target date: 10/25/21 Goal status: partially met   5.  Patient will demonstrate 5/5 Lt ankle inversion and eversion strength and 3/5 Lt ankle plantarflexion strength to improve stability necessary for community ambulation. Baseline: MMT deferred secondary to post-op acuity  Target date: 12/05/21 Goal status: revised   6.  Patient will maintain SLS on the LLE for at least 5 seconds to improve stability when walking on uneven terrain. Baseline: unable Target date: 01/02/22 Goal status: MET  7.  Patient will be able to descend stairs utilizing reciprocal pattern Baseline: step-to  Target date: 02/18/22 Goal status: new  PLAN: PT FREQUENCY: 2x/week  PT DURATION: other: 4-6 weeks  PLANNED INTERVENTIONS: Therapeutic exercises, Therapeutic activity, Neuromuscular re-education, Balance training, Gait training, Patient/Family education, Joint mobilization, Stair training, Aquatic Therapy, Dry Needling, Electrical stimulation, Cryotherapy, Moist heat, Taping, Vasopneumatic device, and Manual therapy  PLAN FOR NEXT SESSION: progress Lt ankle strength  and balance as tolerated    Voncille Lo, PT, [place] Certified Exercise Expert for the Aging Adult  01/09/22 9:23 PM Phone: [phone] Fax: [phone]

## 2022-01-08 NOTE — Telephone Encounter (Signed)
Ammie or Denny Peon- can you please fax the referral for [facility] that was placed previously to them? Thanks! ?

## 2022-01-09 ENCOUNTER — Ambulatory Visit: Payer: Medicare Other | Admitting: Physical Therapy

## 2022-01-09 ENCOUNTER — Other Ambulatory Visit: Payer: Self-pay

## 2022-01-09 DIAGNOSIS — R262 Difficulty in walking, not elsewhere classified: Secondary | ICD-10-CM

## 2022-01-09 DIAGNOSIS — M79672 Pain in left foot: Secondary | ICD-10-CM | POA: Diagnosis not present

## 2022-01-09 DIAGNOSIS — M25672 Stiffness of left ankle, not elsewhere classified: Secondary | ICD-10-CM

## 2022-01-09 DIAGNOSIS — S96919D Strain of unspecified muscle and tendon at ankle and foot level, unspecified foot, subsequent encounter: Secondary | ICD-10-CM

## 2022-01-09 DIAGNOSIS — Z9889 Other specified postprocedural states: Secondary | ICD-10-CM

## 2022-01-09 DIAGNOSIS — M6281 Muscle weakness (generalized): Secondary | ICD-10-CM

## 2022-01-14 ENCOUNTER — Other Ambulatory Visit: Payer: Self-pay

## 2022-01-14 ENCOUNTER — Ambulatory Visit: Payer: Medicare Other

## 2022-01-14 DIAGNOSIS — M79672 Pain in left foot: Secondary | ICD-10-CM

## 2022-01-14 DIAGNOSIS — R262 Difficulty in walking, not elsewhere classified: Secondary | ICD-10-CM

## 2022-01-14 DIAGNOSIS — M6281 Muscle weakness (generalized): Secondary | ICD-10-CM

## 2022-01-14 NOTE — Therapy (Signed)
OUTPATIENT PHYSICAL THERAPY TREATMENT NOTE   Patient Name: Haley White MRN: [id number] DOB:02/19/1956, 66 y.o., female Today's Date: 01/14/2022   PT End of Session - 01/14/22 0931     Visit Number 23    Number of Visits 33    Date for PT Re-Evaluation 02/23/22    Authorization Type MCR    Authorization - Visit Number --    Authorization - Number of Visits --    Progress Note Due on Visit 58    PT Start Time 0931    PT Stop Time 1015    PT Time Calculation (min) 44 min    Activity Tolerance Patient tolerated treatment well    Behavior During Therapy WFL for tasks assessed/performed             Past Medical History:  Diagnosis Date   Anxiety    Arthritis    Asthma    Cataract    Gout    Hyperlipidemia    Hypertension    Obese    Past Surgical History:  Procedure Laterality Date   APPENDECTOMY  1984   CATARACT EXTRACTION W/ INTRAOCULAR LENS  IMPLANT, BILATERAL  2014   CESAREAN SECTION  1984, 1987, 1994   X3   COLONOSCOPY  11/16/2013   w/Brodie    FOOT SURGERY Left 2002   KNEE SURGERY Left 1998   ROTATOR CUFF REPAIR Right 2007   [place]   Patient Active Problem List   Diagnosis Date Noted   Allergic rhinitis 03/15/2021   Allergic rhinitis due to animal (cat) (dog) hair and dander 03/15/2021   Allergic rhinitis due to pollen 03/15/2021   Gastro-esophageal reflux disease without esophagitis 03/15/2021   Mild persistent asthma, uncomplicated 93/23/5573   Aortic atherosclerosis ([place]) by Chest CT scan on 02/02/2018  01/10/2021   Asthma    Low back pain 03/17/2019   Lumbosacral spondylosis without myelopathy 03/17/2019   Cubital tunnel syndrome 10/14/2018   Closed fracture of distal end of right radius 07/09/2018   Pseudogout 12/09/2017   Major depressive disorder, recurrent, severe without psychotic features ([place])    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   Suicide attempt by drug ingestion ([place])    Medication management 08/22/2014   Vitamin D  deficiency 08/22/2014   Hyperlipidemia, mixed 08/22/2014   Hypertension    Anxiety    Abnormal glucose    Class 2 severe obesity due to excess calories with serious comorbidity and body mass index (BMI) of 37.0 to 37.9 in adult [facility])     PCP: Unk Pinto, MD  REFERRING PROVIDER: Trula Slade, DPM  REFERRING DIAG: [phone] (ICD-10-CM) - Tendon tear, ankle, subsequent encounter  THERAPY DIAG:  Pain in left foot  Muscle weakness (generalized)  Difficulty in walking, not elsewhere classified    SUBJECTIVE:   SUBJECTIVE STATEMENT: "Yesterday was bad, maybe because of the weather. Today is better." Patient reports the left knee is bothering her more and has buckled a few times.  PERTINENT HISTORY: two Lt ankle surgeries within 8 months; HTN; anxiety; global OA  PAIN:  Are you having pain? Yes NPRS scale: 4/10 Pain location: Lt knee PAIN TYPE: rubbing, pinching  Pain description: intermittent  Aggravating factors: in the AM, repetitive movement  Relieving factors: unknown   PRECAUTIONS: None  WEIGHT BEARING RESTRICTIONS No    OBJECTIVE:   IMAGING:  06/06/2021: MRI L Ankle Without Contrast: IMPRESSION: 1. 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 Long segment longitudinal split tear of the  peroneus brevis tendon. Peroneus longus tendon is laterally subluxed. 2. Chronic Achilles tendinopathy with small partial-thickness interstitial tears distally, similar to prior. 3. Moderate arthropathy within the midfoot and TMT joints, not significantly progressed from prior.  PATIENT SURVEYS:  FOTO 52%  PALPATION: Diffuse tenderness about Lt lateral ankle  LE AROM/PROM:  A/PROM Right 01/14/2022 Left 01/14/2022  Hip flexion    Hip extension    Hip abduction    Hip adduction    Hip internal rotation    Hip external rotation    Knee flexion    Knee extension    Ankle dorsiflexion  WNL  Ankle plantarflexion  WNL  Ankle inversion  WNL   Ankle eversion  WNL mild pn    (Blank rows = not tested)  LE MMT:  MMT Right 01/14/2022 Left 01/14/2022  Hip flexion 5 5  Hip extension 4+ 4+  Hip abduction 4+ 4+  Hip adduction    Hip internal rotation    Hip external rotation    Knee flexion    Knee extension    Ankle dorsiflexion  5  Ankle plantarflexion 25 SL calf raises  Unable to complete SL calf raise   Ankle inversion  4+/5 pn  Ankle eversion  4+/5 pn   (Blank rows = not tested)  FUNCTIONAL TESTS:  6 MWT: 1130 ft; 4/10 pain Lt lateral ankle SLS: 5 seconds LLE; 30 seconds RLE   GAIT: Distance walked: 6 MWT Assistive device utilized: None Level of assistance: Complete Independence Comments: no push off LLE, maintains Lt foot ER     TODAY'S TREATMENT: [facility] Adult PT Treatment:                                                DATE: 01/14/22 Therapeutic Exercise: NuStep level 5 x 5 minutes  Rockerboard 2 x 10 A/P and lateral  Calf stretch on wedge x 60 seconds  Lateral step ups on airex 2 x 10  Calf raise on foam 2 x 10  Ankle towel slide x 2  NMR:  Romberg on foam eyes closed 3 x 30  Semi tandem on foam 2 x 30 sec bilateral  Tandem on foam 1 x 30 sec bilateral  Updated HEP to include tandem stance     [facility] Adult PT Treatment:                                                DATE: 01/07/22 Therapeutic Exercise: Leg press 3 x 10 @ 20 lbs  SL calf raise on leg press attempted; discontinued due to pain 6 minute walk  Seated calf raise 3 x 10 @ 10 lbs LLE   Therapeutic Activity: Re-assessment to determine overall progress, educating patient on progressing towards goals and updated POC.      PATIENT EDUCATION:  Education details: see treatment above Person educated: Patient Education method: Explanation, demo, handout  Education comprehension: verbalized understanding, returned demo    HOME EXERCISE PROGRAM: Access Code [place] Added aquatics 12/13/21  ASSESSMENT:  CLINICAL IMPRESSION: Patient  tolerated session well today focusing on ankle mobility/strength and static balance activity. She is initially challenged with maintaining stability during balance activity on unstable surface, though able to progress to tandem on  foam with patient demonstrating good postural control with continued practice. She reports mild discomfort about gastroc/soleus with calf raises and peroneals with eversion towel slides, though reported her ankle felt better at conclusion of session.    OBJECTIVE IMPAIRMENTS Abnormal gait, decreased activity tolerance, decreased balance, difficulty walking, decreased strength, and pain.    GOALS:   SHORT TERM GOALS:  Pt will report understanding and adherence to her HEP in order to promote independence in the management of her primary impairments.  Target date:  09/27/21 Goal status: MET  2.  Pt will demonstrate ability to tolerate lateral weightshifting in standing in order to transition to walking with less limitation.  Target date: 09/27/21 Goal status: MET   LONG TERM GOALS:  Pt will achieve a FOTO score of 55% or higher in order to demonstrate improved functional ability as it relates to her L ankle pain. Baseline: 43 Target date:  10/25/21 Goal status: IN PROGRESS  2.  Pt will demonstrate the ability to perform a 6-minute walk test without CAM boot or AD at self-selected pace in order to progress to safe community ambulation. Baseline: unable Target date: 10/25/21 Goal status: MET  3.  Pt will demonstrate Lt ankle inversion/eversion AROM WNL with 0-2/10 pain in order to put on her socks and shoes without limitation. Baseline: 18 inversion; 4 eversion  Target date: 10/25/21 Goal status: MET  4.  Pt will demonstrate BIL global hip strength of 5/5 in order to independently progress her LE strengthening regimen without limitation. Baseline: 4- to 5/5  Target date: 10/25/21 Goal status: partially met   5.  Patient will demonstrate 5/5 Lt ankle  inversion and eversion strength and 3/5 Lt ankle plantarflexion strength to improve stability necessary for community ambulation. Baseline: MMT deferred secondary to post-op acuity  Target date: 12/05/21 Goal status: revised   6.  Patient will maintain SLS on the LLE for at least 5 seconds to improve stability when walking on uneven terrain. Baseline: unable Target date: 01/02/22 Goal status: MET  7.  Patient will be able to descend stairs utilizing reciprocal pattern Baseline: step-to  Target date: 02/18/22 Goal status: new  PLAN: PT FREQUENCY: 2x/week  PT DURATION: other: 4-6 weeks  PLANNED INTERVENTIONS: Therapeutic exercises, Therapeutic activity, Neuromuscular re-education, Balance training, Gait training, Patient/Family education, Joint mobilization, Stair training, Aquatic Therapy, Dry Needling, Electrical stimulation, Cryotherapy, Moist heat, Taping, Vasopneumatic device, and Manual therapy  PLAN FOR NEXT SESSION: progress Lt ankle strength  and balance as tolerated   Gwendolyn Grant, PT, DPT, ATC 01/14/22 10:34 AM

## 2022-01-15 ENCOUNTER — Encounter: Payer: Self-pay | Admitting: Internal Medicine

## 2022-01-15 NOTE — Patient Instructions (Signed)
Due to recent changes in healthcare laws, you may see the results of your imaging and laboratory studies on MyChart before your provider has had a chance to review them.  We understand that in some cases there may be results that are confusing or concerning to you. Not all laboratory results come back in the same time frame and the provider may be waiting for multiple results in order to interpret others.  Please give us 48 hours in order for your provider to thoroughly review all the results before contacting the office for clarification of your results.  ? ?+++++++++++++++++++++++++ ? Vit D  & ?Vit C 1,000 mg   ?are recommended to help protect  ?against the Covid-19 and other Corona viruses.  ? ? Also it's recommended  ?to take  ?Zinc 50 mg  ?to help  ?protect against the Covid-19   ?and best place to get ? is also on [employer]  ?and don't pay more than 6-8 cents /pill !  ?================================ ?Coronavirus (COVID-19) Are you at risk? ? ?Are you at risk for the Coronavirus (COVID-19)? ? ?To be considered HIGH RISK for Coronavirus (COVID-19), you have to meet the following criteria: ? ?Traveled to [place], [place], [place], [place] or [place]; or in the [place] to [place], [place], [place]  ?or [place]; and have fever, cough, and shortness of breath within the last 2 weeks of travel OR ?Been in close contact with a person diagnosed with COVID-19 within the last 2 weeks and have  ?fever, cough,and shortness of breath ? ?IF YOU DO NOT MEET THESE CRITERIA, YOU ARE CONSIDERED LOW RISK FOR COVID-19. ? ?What to do if you are HIGH RISK for COVID-19? ? ?If you are having a medical emergency, call 911. ?Seek medical care right away. Before you go to a doctor?s office, urgent care or emergency department, ? call ahead and tell them about your recent travel, contact with someone diagnosed with COVID-19  ? and your symptoms.  ?You should receive instructions from your physician?s office regarding next  steps of care.  ?When you arrive at healthcare provider, tell the healthcare staff immediately you have returned from  ?visiting [place], [place], [place], [place] or [place]; or traveled in the [place] to [place], [place],  ?[place] or [place] in the last two weeks or you have been in close contact with a person diagnosed with  ?COVID-19 in the last 2 weeks.   ?Tell the health care staff about your symptoms: fever, cough and shortness of breath. ?After you have been seen by a medical provider, you will be either: ?Tested for (COVID-19) and discharged home on quarantine except to seek medical care if  ?symptoms worsen, and asked to  ?Stay home and avoid contact with others until you get your results (4-5 days)  ?Avoid travel on public transportation if possible (such as bus, train, or airplane) or ?Sent to the Emergency Department by EMS for evaluation, COVID-19 testing  and  ?possible admission depending on your condition and test results. ? ?What to do if you are LOW RISK for COVID-19? ? ?Reduce your risk of any infection by using the same precautions used for avoiding the common cold or flu:  ?Wash your hands often with soap and warm water for at least 20 seconds.  If soap and water are not readily available,  ?use an alcohol-based hand sanitizer with at least 60% alcohol.  ?If coughing or sneezing, cover your mouth and nose by coughing   or sneezing into the elbow areas of your shirt or coat, ? into a tissue or into your sleeve (not your hands). ?Avoid shaking hands with others and consider head nods or verbal greetings only. ?Avoid touching your eyes, nose, or mouth with unwashed hands.  ?Avoid close contact with people who are sick. ?Avoid places or events with large numbers of people in one location, like concerts or sporting events. ?Carefully consider travel plans you have or are making. ?If you are planning any travel outside or inside the [place], visit the CDC?s Travelers? Health webpage for the  latest health notices. ?If you have some symptoms but not all symptoms, continue to monitor at home and seek medical attention  ?if your symptoms worsen. ?If you are having a medical emergency, call 911. ? ? ?>>>>>>>>>>>>>>>>>>>>>>>>>>>>>>>>> ? ?We Do NOT Approve of [employer] ?> > > > > > > > > > > > > > > > > > > > > > > > > > > > > > > > > > > > > > > ? ?Preventive Care for Adults ? ?A healthy lifestyle and preventive care can promote health and wellness. Preventive health guidelines for women include the following key practices. ?A routine yearly physical is a good way to check with your health care provider about your health and preventive screening. It is a chance to share any concerns and updates on your health and to receive a thorough exam. ?Visit your dentist for a routine exam and preventive care every 6 months. Brush your teeth twice a day and floss once a day. Good oral hygiene prevents tooth decay and gum disease. ?The frequency of eye exams is based on your age, health, family medical history, use of contact lenses, and other factors. Follow your health care provider's recommendations for frequency of eye exams. ?Eat a healthy diet. Foods like vegetables, fruits, whole grains, low-fat dairy products, and lean protein foods contain the nutrients you need without too many calories. Decrease your intake of foods high in solid fats, added sugars, and salt. Eat the right amount of calories for you. Get information about a proper diet from your health care provider, if necessary. ?Regular physical exercise is one of the most important things you can do for your health. Most adults should get at least 150 minutes of moderate-intensity exercise (any activity that increases your heart rate and causes you to sweat) each week. In addition, most adults need muscle-strengthening exercises on 2 or more days a week. ?Maintain a healthy weight. The body mass index (BMI) is a screening tool to identify  possible weight problems. It provides an estimate of body fat based on height and weight. Your health care provider can find your BMI and can help you achieve or maintain a healthy weight. For adults 20 years and older: ?A BMI below 18.5 is considered underweight. ?A BMI of 18.5 to 24.9 is normal. ?A BMI of 25 to 29.9 is considered overweight. ?A BMI of 30 and above is considered obese. ?Maintain normal blood lipids and cholesterol levels by exercising and minimizing your intake of saturated fat. Eat a balanced diet with plenty of fruit and vegetables. If your lipid or cholesterol levels are high, you are over 50, or you are at high risk for heart disease, you may need your cholesterol levels checked more frequently. Ongoing high lipid and cholesterol levels should be treated with medicines if diet and exercise are not working. ?If you smoke, find out from   your health care provider how to quit. If you do not use tobacco, do not start. ?Lung cancer screening is recommended for adults aged 55-80 years who are at high risk for developing lung cancer because of a history of smoking. A yearly low-dose CT scan of the lungs is recommended for people who have at least a 30-pack-year history of smoking and are a current smoker or have quit within the past 15 years. A pack year of smoking is smoking an average of 1 pack of cigarettes a day for 1 year (for example: 1 pack a day for 30 years or 2 packs a day for 15 years). Yearly screening should continue until the smoker has stopped smoking for at least 15 years. Yearly screening should be stopped for people who develop a health problem that would prevent them from having lung cancer treatment. ?Avoid use of street drugs. Do not share needles with anyone. Ask for help if you need support or instructions about stopping the use of drugs. ?High blood pressure causes heart disease and increases the risk of stroke.  Ongoing high blood pressure should be treated with medicines if  weight loss and exercise do not work. ?If you are 55-79 years old, ask your health care provider if you should take aspirin to prevent strokes. ?Diabetes screening involves taking a blood sample to check your fast

## 2022-01-15 NOTE — Therapy (Signed)
?OUTPATIENT PHYSICAL THERAPY TREATMENT NOTE ? ? ?Patient Name: Haley White ?MRN: [id number] ?DOB:1956-02-07, 66 y.o., female ?Today's Date: 01/16/2022 ? ? PT End of Session - 01/16/22 1506   ? ? Visit Number 24   ? Number of Visits 33   ? Date for PT Re-Evaluation 02/23/22   ? Authorization Type MCR   ? Authorization - Number of Visits 30   ? PT Start Time 1501   ? PT Stop Time 1751   ? PT Time Calculation (min) 44 min   ? Activity Tolerance Patient tolerated treatment well   ? Behavior During Therapy [facility] for tasks assessed/performed   ? ?  ?  ? ?  ? ? ? ?Past Medical History:  ?Diagnosis Date  ? Anxiety   ? Arthritis   ? Asthma   ? Cataract   ? Gout   ? Hyperlipidemia   ? Hypertension   ? Obese   ? ?Past Surgical History:  ?Procedure Laterality Date  ? APPENDECTOMY  1984  ? CATARACT EXTRACTION W/ INTRAOCULAR LENS  IMPLANT, BILATERAL  2014  ? [place]  ? X3  ? COLONOSCOPY  11/16/2013  ? w/[place]   ? FOOT SURGERY Left 2002  ? KNEE SURGERY Left 1998  ? ROTATOR CUFF REPAIR Right 2007  ? TUBAL LIGATION  1994  ? ?Patient Active Problem List  ? Diagnosis Date Noted  ? Allergic rhinitis 03/15/2021  ? Allergic rhinitis due to animal (cat) (dog) hair and dander 03/15/2021  ? Allergic rhinitis due to pollen 03/15/2021  ? Gastro-esophageal reflux disease without esophagitis 03/15/2021  ? Mild persistent asthma, uncomplicated 02/58/5277  ? Aortic atherosclerosis ([place]) by Chest CT scan on 02/02/2018  01/10/2021  ? Asthma   ? Low back pain 03/17/2019  ? Lumbosacral spondylosis without myelopathy 03/17/2019  ? Cubital tunnel syndrome 10/14/2018  ? Closed fracture of distal end of right radius 07/09/2018  ? Pseudogout 12/09/2017  ? Major depressive disorder, recurrent, severe without psychotic features ([place])   ? 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 Suicide attempt by drug ingestion ([place])   ? Medication management 08/22/2014  ? Vitamin D deficiency 08/22/2014  ? Hyperlipidemia, mixed 08/22/2014  ?  Hypertension   ? Anxiety   ? Abnormal glucose   ? Class 2 severe obesity due to excess calories with serious comorbidity and body mass index (BMI) of 37.0 to 37.9 in adult [facility])   ? ? ?PCP: Unk Pinto, MD ? ?REFERRING PROVIDER: Trula Slade, DPM ? ?REFERRING DIAG: O24.235T (ICD-10-CM) - Tendon tear, ankle, subsequent encounter ? ?THERAPY DIAG:  ?Pain in left foot ? ?Muscle weakness (generalized) ? ?Difficulty in walking, not elsewhere classified ? ?Tendon tear, ankle, unspecified laterality, subsequent encounter ? ?Stiffness of left ankle, not elsewhere classified ? ?Status post left foot surgery ? ? ? ?SUBJECTIVE:  ? ?SUBJECTIVE STATEMENT: ?" The weather has really been hard on me.  My foot is about a 3/10 today" ?I popped my foot during exercise the other day.  It hurt at first but then it felt better" ? ?PERTINENT HISTORY: ?two Lt ankle surgeries within 8 months; HTN; anxiety; global OA ? ?PAIN:  ?Are you having pain? Yes ?NPRS scale: 3/10 ?Pain location: Lt knee ?PAIN TYPE: rubbing, pinching  ?Pain description: intermittent  ?Aggravating factors: in the AM, repetitive movement  ?Relieving factors: unknown  ? ?PRECAUTIONS: None ? ?WEIGHT BEARING RESTRICTIONS No ? ? ? ?OBJECTIVE:  ? IMAGING:  ?06/06/2021: MRI L Ankle Without Contrast: IMPRESSION: 1.  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 Long segment longitudinal split tear of the peroneus brevis tendon. Peroneus longus tendon is laterally subluxed. 2. Chronic Achilles tendinopathy with small partial-thickness interstitial tears distally, similar to prior. 3. Moderate arthropathy within the midfoot and TMT joints, not significantly progressed from prior. ? ?PATIENT SURVEYS:  ?FOTO 52% ? ?PALPATION: ?Diffuse tenderness about Lt lateral ankle ? ?LE AROM/PROM: ? ?A/PROM Right ?01/16/2022 Left ?01/16/2022  ?Hip flexion    ?Hip extension    ?Hip abduction    ?Hip adduction    ?Hip internal rotation    ?Hip external rotation    ?Knee  flexion    ?Knee extension    ?Ankle dorsiflexion  WNL  ?Ankle plantarflexion  WNL  ?Ankle inversion  WNL  ?Ankle eversion  WNL mild pn   ? (Blank rows = not tested) ? ?LE MMT: ? ?MMT Right ?01/16/2022 Left ?01/16/2022  ?Hip flexion 5 5  ?Hip extension 4+ 4+  ?Hip abduction 4+ 4+  ?Hip adduction    ?Hip internal rotation    ?Hip external rotation    ?Knee flexion    ?Knee extension    ?Ankle dorsiflexion  5  ?Ankle plantarflexion 25 SL calf raises  Unable to complete SL calf raise   ?Ankle inversion  4+/5 pn  ?Ankle eversion  4+/5 pn  ? (Blank rows = not tested) ? ?FUNCTIONAL TESTS:  ?6 MWT: 1130 ft; 4/10 pain Lt lateral ankle ?SLS: 5 seconds LLE; 30 seconds RLE  ? ?GAIT: ?Distance walked: 6 MWT ?Assistive device utilized: None ?Level of assistance: Complete Independence ?Comments: no push off LLE, maintains Lt foot ER  ? ? ? ?TODAY'S TREATMENT: ?[place] [facility] Adult PT Treatment:                                                DATE: 01-16-22 ?Aquatics ?Pt seen for aquatic therapy today.  Treatment took place in water 3.25-4.8 ft in depth at the [employer] pool. Temp of water was 92?.  Pt entered/exited the pool via stairs  step to pattern independently with bilat rail ?  ?Walking forward, back and side stepping 10 widths each utilizing aquatic barbells ?Seated: flutter kicking with concentration on DF/PF with each stride 5x25 reps added IN/EV as pt tolerated easily taking on additional resistance by PT Initially EV was painful. Distraction of toes on left ?Aqua stretch for gastroc tightness and over peroneals ?Worked on eversion with stepping in the water.  Pt has been dominantly bearing weight over great toe and trying to unweight during eversion part of gait. ?Balance: SLS R/L 3x30 sec standing on yellow pool noodle balance with rocker forward and backward for uneven stepping ?  ? ?Hip abd/add R/Leach and then using 1 UE support ?Hip ext/flex with knee straight  pt needing VC and TC for correct execution and  sequencing ?Figure 4 stretch SLS for 60 sec each with UE on pool wall ?Standing gastroc stretch with great toe flexed at pool wall R and L  ?Squats  ?Gastroc stretch bil on steps ?Runners stretch on R and L 30 sec x 2 and then stretch in to runners hamstring stretch 2 x 30 sec R and L ?Ascend/Descend stairs in water to 3rd step with alternating step pattern 6 times ?Submerged step step ups x 15 on L and R and with curtsy lunge  L and R ?Submerged step with Left foot on step and R stepping up and forward and then forward and back to increase ankle mobility/stride length for ambulation.  Pt initially with pain but able to accomodate ?Supine abdominal hollowing with UE yellow barbells submerged.  ?Supine to prone abdominal engagement with extended legs alternating back and forth ?Treading water for 1 min x 3 ?Heel raise with eccentric lowering of left ?Heel raise on left max 13 reps in water ?  ? Pt requires buoyancy for support and to offload joints with strengthening exercises. Viscosity of the water is needed for resistance of strengthening; water current perturbations provides challenge to standing balance unsupported, requiring increased core activation. ? ?[place] Adult PT Treatment:                                                DATE: 01/14/22 ?Therapeutic Exercise: ?NuStep level 5 x 5 minutes  ?Rockerboard 2 x 10 A/P and lateral  ?Calf stretch on wedge x 60 seconds  ?Lateral step ups on airex 2 x 10  ?Calf raise on foam 2 x 10  ?Ankle towel slide x 2  ?NMR:  ?Romberg on foam eyes closed 3 x 30  ?Semi tandem on foam 2 x 30 sec bilateral  ?Tandem on foam 1 x 30 sec bilateral  ?Updated HEP to include tandem stance  ? ? ? ?[facility] Adult PT Treatment:                                                DATE: 01/07/22 ?Therapeutic Exercise: ?Leg press 3 x 10 @ 20 lbs  ?SL calf raise on leg press attempted; discontinued due to pain ?6 minute walk  ?Seated calf raise 3 x 10 @ 10 lbs LLE  ? ?Therapeutic Activity: ?Re-assessment to  determine overall progress, educating patient on progressing towards goals and updated POC.  ? ? ? ? ?PATIENT EDUCATION:  ?Education details: see treatment above ?Person educated: Patient ?Education method: Explanation

## 2022-01-15 NOTE — Progress Notes (Signed)
? ?Annual Screening/Preventative Visit ?& Comprehensive Evaluation &  Examination ? ?Future Appointments  ?Date Time Provider Department  ?01/16/2022 10:00 AM Unk Pinto, MD [facility]  ?01/22/2022  9:15 AM Trula Slade, DPM [facility]  ?01/22/2023 10:00 AM Unk Pinto, MD [facility]  ? ? ?    This very nice 66 y.o. WWF presents for a Screening /Preventative Visit & comprehensive evaluation and management of multiple medical co-morbidities.  Patient has been followed for HTN, HLD, T2_NIDDM  Prediabetes  and Vitamin D Deficiency.    In Apr 2019,   Chest CT scan showed Aortic Atherosclerosis.  Patient is treated by Dr Chucky May  for ADD & Depression with Fluoxetine /Bupropion & Methylphenidate.  Patient has had recent surgeries to the Left foot x 2.  ? ? ?     HTN predates circa 2008.   Patient's BP has been controlled at home and patient denies any cardiac symptoms as chest pain, palpitations, shortness of breath, dizziness or ankle swelling. Today's BP is at goal - 120/82. ? ? ?    Patient's hyperlipidemia is controlled with diet and medications. Patient denies myalgias or other medication SE's. Last lipids were at goal : ? ?Lab Results  ?Component Value Date  ? CHOL 177 01/10/2021  ? HDL 79 01/10/2021  ? [place] 79 01/10/2021  ? TRIG 108 01/10/2021  ? CHOLHDL 2.2 01/10/2021  ? ? ? ?    Patient has moderate Obesity (BMI 37+) & consequent  prediabetes (5.9% /2014) and patient denies reactive hypoglycemic symptoms, visual blurring, diabetic polys or paresthesias. Last A1c was normal & at goal : ? ?Lab Results  ?Component Value Date  ? HGBA1C 5.4 01/10/2021  ? ? ? ?    Finally, patient has history of Vitamin D Deficiency ("20" /2010) and last Vitamin D was at goal : ? ?Lab Results  ?Component Value Date  ? VD25OH 100 01/10/2021  ? ? ? ?Current Outpatient Medications on File Prior to Visit  ?Medication Sig  ? albuterol HFA inhaler Inhale 2 puffs every 4 hours as needed for   ? VITAMIN C  Take 1 tablet    ? B Complex Vitamins  Take 1 tablet daily.  ? buPROPion XL 300 MG  Take  every morning.  ? CALCIUM 600  Take  daily.  ? CELEBREX 100 MG  Take 1 capsule  2  times daily.  ? EPINEPHrine 0.3 mg/0.3 mL    ? fexofenadine  180 MG tablet 1 tablet  ? FLUoxetine20 MG capsule Take 1 capsule daily. For depression  ? FLONASE  nasal spray 1 - 2 SPRAYS qd  ? gabapentin 100 MG capsule TAKE 1 CAPSULE AT BEDTIME  ? gabapentin  300 MG capsule Take 1 capsule daily.  ? VICODIN 5-325 MG tablet Take 1 tablet every 6  hours as needed.  ? levocetirizine 5 MG tablet TK 1 T PO QD   ? lisinopril 20 MG tablet TAKE 1 TABLET  DAILY   ? Magnesium 400 MG TABS Take 1 tablet  daily.  ? methylphenidate  10 MG tablet TK 1 T PO BID  ? montelukast  10 MG tablet 1 tablet  ? rosuvastatin  40 MG tablet TAKE 1 TABLET  DAILY   ? SYMBICORT 160-4.5 inhaler INL 2 PFS PO BID  ? VITAMIN D PO Take 15,000 Units daily.  ? ? ? ? ?Allergies  ?Allergen Reactions  ? Molds & Smuts Anaphylaxis  ? Biaxin [Clarithromycin]   ?  GI upset  ?  Serevent [Salmeterol]   ?  Palpitations  ? Tequin [Gatifloxacin]   ?  GI upset. thrush  ? ? ? ?Past Medical History:  ?Diagnosis Date  ? Anxiety   ? Arthritis   ? Asthma   ? Cataract   ? Gout   ? Hyperlipidemia   ? Hypertension   ? Obese   ? ? ? ?Health Maintenance  ?Topic Date Due  ? Hepatitis C Screening  Never done  ? Zoster Vaccines- Shingrix (1 of 2) Never done  ? COVID-19 Vaccine (4 - Booster for Pfizer series) 05/22/2020  ? INFLUENZA VACCINE  06/04/2021  ? MAMMOGRAM  01/13/2022  ? Pneumonia Vaccine 75+ Years old (39) 10/08/2022  ? PAP SMEAR-Modifier  11/28/2022  ? TETANUS/TDAP  03/28/2030  ? DEXA SCAN  Completed  ? HIV Screening  Completed  ? HPV VACCINES  Aged Out  ? ? ? ?Immunization History  ?Administered Date(s) Administered  ? Influenza, High Dose  10/17/2017  ? Influenza,inj,Quad  09/17/2017  ? Influenza 08/20/2018  ? PFIZER SARS-COV-2 Vacc 01/20/2020, 02/10/2020, 03/27/2020  ? PPD Test 10/08/2017, 11/29/2019, 01/10/2021  ?  Pneumococcal - 13 11/05/1995  ? Pneumococcal - 23 10/08/2017  ? Td 11/29/2019  ? Tdap 11/04/2008, 03/28/2020  ? ? ? ?Last Colon - 12/27/2019 - Dr Silverio Decamp - Recommended 3 year f/u due Mar 2024 ?  ?Last MGM - 01/15/2020 ?  ?Last BMD - 02/25/2020 - Osteopenia (T-2.0)  ? ? ?Past Surgical History:  ?Procedure Laterality Date  ? APPENDECTOMY  1984  ? CATARACT EXTRACTION W/ INTRAOCULAR LENS  IMPLANT, BILATERAL  2014  ? CESAREAN SECTION x 3   1984, 1987, 1994  ? COLONOSCOPY  11/16/2013  ? w/Brodie   ? FOOT SURGERY Left 2002  ? KNEE SURGERY Left 1998  ? ROTATOR CUFF REPAIR Right 2007  ? TUBAL LIGATION  1994  ? ? ? ?Family History  ?Problem Relation Age of Onset  ? Dementia Father   ? Anxiety disorder Sister   ? Colon cancer Neg Hx   ? Colon polyps Neg Hx   ? Esophageal cancer Neg Hx   ? Stomach cancer Neg Hx   ? Rectal cancer Neg Hx   ? ? ? ?Social History  ? ?Tobacco Use  ? Smoking status: Former  ?  Packs/day: 1.00  ?  Years: 7.00  ?  Pack years: 7.00  ?  Types: Cigarettes  ?  Quit date: 11/04/1977  ?  Years since quitting: 44.2  ? Smokeless tobacco: Never  ?Substance Use Topics  ? Alcohol use: Yes  ?  Alcohol/week: 4.0 standard drinks  ?  Types: 4 Glasses of wine per week  ? Drug use: No  ? ? ? ? ROS ?Constitutional: Denies fever, chills, weight loss/gain, headaches, insomnia,  night sweats, and change in appetite. Does c/o fatigue. ?Eyes: Denies redness, blurred vision, diplopia, discharge, itchy, watery eyes.  ?ENT: Denies discharge, congestion, post nasal drip, epistaxis, sore throat, earache, hearing loss, dental pain, Tinnitus, Vertigo, Sinus pain, snoring.  ?Cardio: Denies chest pain, palpitations, irregular heartbeat, syncope, dyspnea, diaphoresis, orthopnea, PND, claudication, edema ?Respiratory: denies cough, dyspnea, DOE, pleurisy, hoarseness, laryngitis, wheezing.  ?Gastrointestinal: Denies dysphagia, heartburn, reflux, water brash, pain, cramps, nausea, vomiting, bloating, diarrhea, constipation, hematemesis,  melena, hematochezia, jaundice, hemorrhoids ?Genitourinary: Denies dysuria, frequency, urgency, nocturia, hesitancy, discharge, hematuria, flank pain ?Breast: Breast lumps, nipple discharge, bleeding.  ?Musculoskeletal: Denies arthralgia, myalgia, stiffness, Jt. Swelling, pain, limp, and strain/sprain. Denies falls. ?Skin: Denies puritis, rash, hives, warts, acne, eczema,  changing in skin lesion ?Neuro: No weakness, tremor, incoordination, spasms, paresthesia, pain ?Psychiatric: Denies confusion, memory loss, sensory loss. Denies Depression. ?Endocrine: Denies change in weight, skin, hair change, nocturia, and paresthesia, diabetic polys, visual blurring, hyper / hypo glycemic episodes.  ?Heme/Lymph: No excessive bleeding, bruising, enlarged lymph nodes. ? ?Physical Exam ? ?BP 120/82   Pulse 91   Temp 97.9 ?F (36.6 ?C)   Resp 16   Ht '5\' 3"'$  (1.6 m)   Wt 215 lb 12.8 oz (97.9 kg)   LMP 11/26/2011   SpO2 97%   BMI 38.23 kg/m?  ? ?General Appearance: Well nourished, well groomed and in no apparent distress. ? ?Eyes: PERRLA, EOMs, conjunctiva no swelling or erythema, normal fundi and vessels. ?Sinuses: No frontal/maxillary tenderness ?ENT/Mouth: EACs patent / TMs  nl. Nares clear without erythema, swelling, mucoid exudates. Oral hygiene is good. No erythema, swelling, or exudate. Tongue normal, non-obstructing. Tonsils not swollen or erythematous. Hearing normal.  ?Neck: Supple, thyroid not palpable. No bruits, nodes or JVD. ?Respiratory: Respiratory effort normal.  BS equal and clear bilateral without rales, rhonci, wheezing or stridor. ?Cardio: Heart sounds are normal with regular rate and rhythm and no murmurs, rubs or gallops. Peripheral pulses are normal and equal bilaterally without edema. No aortic or femoral bruits. ?Chest: symmetric with normal excursions and percussion. ?Breasts: Symmetric, without lumps, nipple discharge, retractions, or fibrocystic changes.  ?Abdomen: Flat, soft with bowel sounds  active. Nontender, no guarding, rebound, hernias, masses, or organomegaly.  ?Lymphatics: Non tender without lymphadenopathy.  ?Musculoskeletal: Full ROM all peripheral extremities, joint stability, 5/5 strength, and

## 2022-01-16 ENCOUNTER — Ambulatory Visit (INDEPENDENT_AMBULATORY_CARE_PROVIDER_SITE_OTHER): Payer: Medicare Other | Admitting: Internal Medicine

## 2022-01-16 ENCOUNTER — Ambulatory Visit: Payer: Medicare Other | Admitting: Physical Therapy

## 2022-01-16 ENCOUNTER — Other Ambulatory Visit: Payer: Self-pay

## 2022-01-16 ENCOUNTER — Encounter: Payer: Self-pay | Admitting: Internal Medicine

## 2022-01-16 ENCOUNTER — Encounter: Payer: Self-pay | Admitting: Physical Therapy

## 2022-01-16 VITALS — BP 120/82 | HR 91 | Temp 97.9°F | Resp 16 | Ht 63.0 in | Wt 215.8 lb

## 2022-01-16 DIAGNOSIS — M79672 Pain in left foot: Secondary | ICD-10-CM

## 2022-01-16 DIAGNOSIS — Z8249 Family history of ischemic heart disease and other diseases of the circulatory system: Secondary | ICD-10-CM

## 2022-01-16 DIAGNOSIS — I700 Atherosclerosis of aorta: Secondary | ICD-10-CM | POA: Diagnosis not present

## 2022-01-16 DIAGNOSIS — E782 Mixed hyperlipidemia: Secondary | ICD-10-CM

## 2022-01-16 DIAGNOSIS — M25672 Stiffness of left ankle, not elsewhere classified: Secondary | ICD-10-CM

## 2022-01-16 DIAGNOSIS — I10 Essential (primary) hypertension: Secondary | ICD-10-CM | POA: Diagnosis not present

## 2022-01-16 DIAGNOSIS — R262 Difficulty in walking, not elsewhere classified: Secondary | ICD-10-CM

## 2022-01-16 DIAGNOSIS — Z136 Encounter for screening for cardiovascular disorders: Secondary | ICD-10-CM

## 2022-01-16 DIAGNOSIS — S96919D Strain of unspecified muscle and tendon at ankle and foot level, unspecified foot, subsequent encounter: Secondary | ICD-10-CM

## 2022-01-16 DIAGNOSIS — Z1211 Encounter for screening for malignant neoplasm of colon: Secondary | ICD-10-CM

## 2022-01-16 DIAGNOSIS — Z87891 Personal history of nicotine dependence: Secondary | ICD-10-CM | POA: Diagnosis not present

## 2022-01-16 DIAGNOSIS — E559 Vitamin D deficiency, unspecified: Secondary | ICD-10-CM | POA: Diagnosis not present

## 2022-01-16 DIAGNOSIS — Z0001 Encounter for general adult medical examination with abnormal findings: Secondary | ICD-10-CM

## 2022-01-16 DIAGNOSIS — R7309 Other abnormal glucose: Secondary | ICD-10-CM

## 2022-01-16 DIAGNOSIS — Z79899 Other long term (current) drug therapy: Secondary | ICD-10-CM

## 2022-01-16 DIAGNOSIS — M6281 Muscle weakness (generalized): Secondary | ICD-10-CM

## 2022-01-16 DIAGNOSIS — Z9889 Other specified postprocedural states: Secondary | ICD-10-CM

## 2022-01-17 LAB — VITAMIN D 25 HYDROXY (VIT D DEFICIENCY, FRACTURES): Vit D, 25-Hydroxy: 114 ng/mL — ABNORMAL HIGH (ref 30–100)

## 2022-01-17 LAB — COMPLETE METABOLIC PANEL WITH GFR
AG Ratio: 2 (calc) (ref 1.0–2.5)
ALT: 19 U/L (ref 6–29)
AST: 15 U/L (ref 10–35)
Albumin: 4.5 g/dL (ref 3.6–5.1)
Alkaline phosphatase (APISO): 72 U/L (ref 37–153)
BUN: 12 mg/dL (ref 7–25)
CO2: 26 mmol/L (ref 20–32)
Calcium: 9.6 mg/dL (ref 8.6–10.4)
Chloride: 107 mmol/L (ref 98–110)
Creat: 0.98 mg/dL (ref 0.50–1.05)
Globulin: 2.2 g/dL (calc) (ref 1.9–3.7)
Glucose, Bld: 103 mg/dL — ABNORMAL HIGH (ref 65–99)
Potassium: 4.6 mmol/L (ref 3.5–5.3)
Sodium: 143 mmol/L (ref 135–146)
Total Bilirubin: 0.5 mg/dL (ref 0.2–1.2)
Total Protein: 6.7 g/dL (ref 6.1–8.1)
eGFR: 64 mL/min/{1.73_m2} (ref >=60)

## 2022-01-17 LAB — CBC WITH DIFFERENTIAL/PLATELET
Absolute Monocytes: 544 cells/uL (ref 200–950)
Basophils Absolute: 61 cells/uL (ref 0–200)
Basophils Relative: 0.9 %
Eosinophils Absolute: 360 cells/uL (ref 15–500)
Eosinophils Relative: 5.3 %
HCT: 39.9 % (ref 35.0–45.0)
Hemoglobin: 13.3 g/dL (ref 11.7–15.5)
Lymphs Abs: 2536 cells/uL (ref 850–3900)
MCH: 31.1 pg (ref 27.0–33.0)
MCHC: 33.3 g/dL (ref 32.0–36.0)
MCV: 93.4 fL (ref 80.0–100.0)
MPV: 10.8 fL (ref 7.5–12.5)
Monocytes Relative: 8 %
Neutro Abs: 3298 cells/uL (ref 1500–7800)
Neutrophils Relative %: 48.5 %
Platelets: 308 10*3/uL (ref 140–400)
RBC: 4.27 10*6/uL (ref 3.80–5.10)
RDW: 12.2 % (ref 11.0–15.0)
Total Lymphocyte: 37.3 %
WBC: 6.8 10*3/uL (ref 3.8–10.8)

## 2022-01-17 LAB — LIPID PANEL
Cholesterol: 158 mg/dL (ref <200)
HDL: 64 mg/dL (ref >=50)
LDL Cholesterol (Calc): 78 mg/dL (calc)
Non-HDL Cholesterol (Calc): 94 mg/dL (calc) (ref <130)
Total CHOL/HDL Ratio: 2.5 (calc) (ref <5.0)
Triglycerides: 78 mg/dL (ref <150)

## 2022-01-17 LAB — HEMOGLOBIN A1C
Hgb A1c MFr Bld: 5.9 % of total Hgb — ABNORMAL HIGH (ref <5.7)
Mean Plasma Glucose: 123 mg/dL
eAG (mmol/L): 6.8 mmol/L

## 2022-01-17 LAB — MICROALBUMIN / CREATININE URINE RATIO
Creatinine, Urine: 212 mg/dL (ref 20–275)
Microalb Creat Ratio: 6 mcg/mg creat (ref <30)
Microalb, Ur: 1.2 mg/dL

## 2022-01-17 LAB — INSULIN, RANDOM: Insulin: 20.9 u[IU]/mL — ABNORMAL HIGH

## 2022-01-17 LAB — TSH: TSH: 1.04 mIU/L (ref 0.40–4.50)

## 2022-01-17 LAB — MAGNESIUM: Magnesium: 2 mg/dL (ref 1.5–2.5)

## 2022-01-17 NOTE — Progress Notes (Signed)
<><><><><><><><><><><><><><><><><><><><><><><><><><><><><><><><><> ?<><><><><><><><><><><><><><><><><><><><><><><><><><><><><><><><><> ?-   Test results slightly outside the reference range are not unusual. ?If there is anything important, I will review this with you,  ?otherwise it is considered normal test values.  ?If you have further questions,  ?please do not hesitate to contact me at the office or via My Chart.  ?<><><><><><><><><><><><><><><><><><><><><><><><><><><><><><><><><> ?<><><><><><><><><><><><><><><><><><><><><><><><><><><><><><><><><> ? ?-  Vitamin D = 114  - borderline high  ( ideal is betw 70-100 ) - So ? ?- Suggest decrease dose from 15,000 units down to 10,000 units  /day  ?<><><><><><><><><><><><><><><><><><><><><><><><><><><><><><><><><> ?<><><><><><><><><><><><><><><><><><><><><><><><><><><><><><><><><> ? ?-  Total Chol = 158    &    LDL  Chol = 78    -   Both  Excellent  ? ?- Very low risk for Heart Attack  / Stroke ?<><><><><><><><><><><><><><><><><><><><><><><><><><><><><><><><><> ?<><><><><><><><><><><><><><><><><><><><><><><><><><><><><><><><><> ? ?-    A1c = 5.9% Blood sugar and A1c are elevated in the borderline and  ?early or pre-diabetes range which has the same  ? ?300% increased risk for heart attack, stroke, cancer and  ? ?alzheimer- type vascular dementia as full blown diabetes.  ? ?But the good news is that diet, exercise with  ?weight loss can cure the early diabetes at this point. ?<><><><><><><><><><><><><><><><><><><><><><><><><><><><><><><><><> ?<><><><><><><><><><><><><><><><><><><><><><><><><><><><><><><><><> ? ?-  Your blood sugar and A1c are elevated.   ? ?Being diabetic has a  300% increased risk for heart attack,  ?stroke, cancer, and alzheimer- type vascular dementia.  ? ?It is very important that you work harder with diet by  ?avoiding all foods that are white except chicken,   ?fish & calliflower. ? ?- Avoid white rice  ?(brown & wild rice is OK),  ? ?- Avoid white  potatoes  ?(sweet potatoes in moderation is OK),  ? ?White bread or wheat bread or anything made out of  ? ?white flour like bagels, donuts, rolls, buns, biscuits, cakes, ? ?- pastries, cookies, pizza crust, and pasta (made from  ?white flour & egg whites)  ? ?- vegetarian pasta or spinach or wheat pasta is OK. ? ?- Multigrain breads like Arnold's, Pepperidge Farm or  ? ?multigrain sandwich thins or high fiber breads like  ? ?[place] bread or "Dave's Killer" breads that are  ?4 to 5 grams fiber per slice !  are best.   ? ?Diet, exercise and weight loss can reverse and cure  ?diabetes in the early stages.   ?<><><><><><><><><><><><><><><><><><><><><><><><><><><><><><><><><> ?<><><><><><><><><><><><><><><><><><><><><><><><><><><><><><><><><> ? ?-  All Else - CBC - Kidneys - Electrolytes - Liver - Magnesium & Thyroid   ? ?- all  Normal / OK ?<><><><><><><><><><><><><><><><><><><><><><><><><><><><><><><><><> ?<><><><><><><><><><><><><><><><><><><><><><><><><><><><><><><><><> ? ? ? ? ? ? ? ? ?

## 2022-01-21 ENCOUNTER — Ambulatory Visit: Payer: Medicare Other

## 2022-01-21 ENCOUNTER — Other Ambulatory Visit: Payer: Self-pay

## 2022-01-21 DIAGNOSIS — M79672 Pain in left foot: Secondary | ICD-10-CM

## 2022-01-21 DIAGNOSIS — M6281 Muscle weakness (generalized): Secondary | ICD-10-CM

## 2022-01-21 DIAGNOSIS — R262 Difficulty in walking, not elsewhere classified: Secondary | ICD-10-CM

## 2022-01-21 NOTE — Therapy (Signed)
?OUTPATIENT PHYSICAL THERAPY TREATMENT NOTE ? ? ?Patient Name: Haley White ?MRN: [id number] ?DOB:Jan 02, 1956, 66 y.o., female ?Today's Date: 01/21/2022 ? ? PT End of Session - 01/21/22 0927   ? ? Visit Number 25   ? Number of Visits 33   ? Date for PT Re-Evaluation 02/23/22   ? Authorization Type MCR   ? PT Start Time [phone]   ? PT Stop Time 1014   ? PT Time Calculation (min) 43 min   ? Activity Tolerance Patient tolerated treatment well   ? Behavior During Therapy [facility] for tasks assessed/performed   ? ?  ?  ? ?  ? ? ? ?Past Medical History:  ?Diagnosis Date  ? Anxiety   ? Arthritis   ? Asthma   ? Cataract   ? Gout   ? Hyperlipidemia   ? Hypertension   ? Obese   ? ?Past Surgical History:  ?Procedure Laterality Date  ? APPENDECTOMY  1984  ? CATARACT EXTRACTION W/ INTRAOCULAR LENS  IMPLANT, BILATERAL  2014  ? [place]  ? X3  ? COLONOSCOPY  11/16/2013  ? w/Brodie   ? FOOT SURGERY Left 2002  ? KNEE SURGERY Left 1998  ? ROTATOR CUFF REPAIR Right 2007  ? TUBAL LIGATION  1994  ? ?Patient Active Problem List  ? Diagnosis Date Noted  ? Allergic rhinitis 03/15/2021  ? Allergic rhinitis due to animal (cat) (dog) hair and dander 03/15/2021  ? Allergic rhinitis due to pollen 03/15/2021  ? Gastro-esophageal reflux disease without esophagitis 03/15/2021  ? Mild persistent asthma, uncomplicated 62/13/0865  ? Aortic atherosclerosis ([place]) by Chest CT scan on 02/02/2018  01/10/2021  ? Asthma   ? Low back pain 03/17/2019  ? Lumbosacral spondylosis without myelopathy 03/17/2019  ? Cubital tunnel syndrome 10/14/2018  ? Closed fracture of distal end of right radius 07/09/2018  ? Pseudogout 12/09/2017  ? Major depressive disorder, recurrent, severe without psychotic features ([place])   ? 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 Suicide attempt by drug ingestion ([place])   ? Medication management 08/22/2014  ? Vitamin D deficiency 08/22/2014  ? Hyperlipidemia, mixed 08/22/2014  ? Hypertension   ? Anxiety   ? Abnormal glucose    ? Class 2 severe obesity due to excess calories with serious comorbidity and body mass index (BMI) of 37.0 to 37.9 in adult [facility])   ? ? ?PCP: Unk Pinto, MD ? ?REFERRING PROVIDER: Trula Slade, DPM ? ?REFERRING DIAG: H84.696E (ICD-10-CM) - Tendon tear, ankle, subsequent encounter ? ?THERAPY DIAG:  ?Pain in left foot ? ?Muscle weakness (generalized) ? ?Difficulty in walking, not elsewhere classified ? ? ? ?SUBJECTIVE:  ? ?SUBJECTIVE STATEMENT: ?"It hasn't been doing so well since it turned cold again. I think it's the arthritis, but it's ok."  ? ?PERTINENT HISTORY: ?two Lt ankle surgeries within 8 months; HTN; anxiety; global OA ? ?PAIN:  ?Are you having pain? Yes ?NPRS scale: 2/10 ?Pain location: Lt lateral ankle, plantar foot  ?PAIN TYPE: tight, pulling  ?Pain description: intermittent  ?Aggravating factors: prolonged standing/walking  ?Relieving factors: unknown  ? ?PRECAUTIONS: None ? ?WEIGHT BEARING RESTRICTIONS No ? ? ? ?OBJECTIVE:  ? IMAGING:  ?06/06/2021: MRI L Ankle Without Contrast: IMPRESSION: 1. 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 Long segment longitudinal split tear of the peroneus brevis tendon. Peroneus longus tendon is laterally subluxed. 2. Chronic Achilles tendinopathy with small partial-thickness interstitial tears distally, similar to prior. 3. Moderate arthropathy within the midfoot and TMT  joints, not significantly progressed from prior. ? ?PATIENT SURVEYS:  ?FOTO 52% ? ?PALPATION: ?Diffuse tenderness about Lt lateral ankle ? ?LE AROM/PROM: ? ?A/PROM Right ?01/21/2022 Left ?01/21/2022  ?Hip flexion    ?Hip extension    ?Hip abduction    ?Hip adduction    ?Hip internal rotation    ?Hip external rotation    ?Knee flexion    ?Knee extension    ?Ankle dorsiflexion  WNL  ?Ankle plantarflexion  WNL  ?Ankle inversion  WNL  ?Ankle eversion  WNL mild pn   ? (Blank rows = not tested) ? ?LE MMT: ? ?MMT Right ?01/21/2022 Left ?01/21/2022  ?Hip flexion 5 5  ?Hip extension  4+ 4+  ?Hip abduction 4+ 4+  ?Hip adduction    ?Hip internal rotation    ?Hip external rotation    ?Knee flexion    ?Knee extension    ?Ankle dorsiflexion  5  ?Ankle plantarflexion 25 SL calf raises  Unable to complete SL calf raise   ?Ankle inversion  4+/5 pn  ?Ankle eversion  4+/5 pn  ? (Blank rows = not tested) ? ?FUNCTIONAL TESTS:  ?6 MWT: 1130 ft; 4/10 pain Lt lateral ankle ?SLS: 5 seconds LLE; 30 seconds RLE; 01/21/22: 5 seconds LLE ? ?GAIT: ?Distance walked: 6 MWT ?Assistive device utilized: None ?Level of assistance: Complete Independence ?Comments: no push off LLE, maintains Lt foot ER  ? ? ? ?TODAY'S TREATMENT: ?[facility] Adult PT Treatment:                                                DATE: 01/21/22 ?Therapeutic Exercise: ?NuStep level 5 x 5 minutes; UE/LE  ?Great toe flexor stretch 2 x 30 sec ?Great toe extension 2 x 10 ?Arch lift 2 x 10 ?Calf raise on foam attempted; pain  ?Calf stretch at wall x 60 seconds  ?Seated calf raise 2 x 15; 15 lb kettlebell  ?Standing toe raises 2 x 10  ?Updated HEP  ?NMR:    ?SLS 5 trials: 5 seconds LLE; 10-15 seconds RLE ? ? ? ?[facility] Adult PT Treatment:                                                DATE: 01/14/22 ?Therapeutic Exercise: ?NuStep level 5 x 5 minutes  ?Rockerboard 2 x 10 A/P and lateral  ?Calf stretch on wedge x 60 seconds  ?Lateral step ups on airex 2 x 10  ?Calf raise on foam 2 x 10  ?Ankle towel slide x 2  ?NMR:  ?Romberg on foam eyes closed 3 x 30  ?Semi tandem on foam 2 x 30 sec bilateral  ?Tandem on foam 1 x 30 sec bilateral  ?Updated HEP to include tandem stance  ? ? ? ?[facility] Adult PT Treatment:                                                DATE: 01/07/22 ?Therapeutic Exercise: ?Leg press 3 x 10 @ 20 lbs  ?SL calf raise on leg press attempted; discontinued due to pain ?6 minute walk  ?Seated  calf raise 3 x 10 @ 10 lbs LLE  ? ?Therapeutic Activity: ?Re-assessment to determine overall progress, educating patient on progressing towards goals and updated POC.   ? ? ? ? ?PATIENT EDUCATION:  ?Education details: see treatment above ?Person educated: Patient ?Education method: Explanation, demo, handout  ?Education comprehension: verbalized understanding, returned demo  ? ? ?[place]: ?Access Code [place] Added aquatics 12/13/21 ? ?ASSESSMENT: ? ?CLINICAL IMPRESSION: ?Session focused on intrinsic foot strengthening, which she tolerated well without reports of pain, though reports fatigue in her arch towards end of prescribed strengthening. She reported sharp pain about achilles tendon with standing calf raise on the LLE, so this was discontinued. She was able to complete calf raise in sitting without onset of pain. She remains challenged with SLS on the LLE requiring use of stepping/reaching strategy to maintain balance after brief duration of hold.  ? ? ?OBJECTIVE IMPAIRMENTS Abnormal gait, decreased activity tolerance, decreased balance, difficulty walking, decreased strength, and pain.  ? ? ?GOALS: ? ? ?SHORT TERM GOALS: ? ?Pt will report understanding and adherence to her HEP in order to promote independence in the management of her primary impairments. ? ?Target date:  09/27/21 ?Goal status: MET ? ?2.  Pt will demonstrate ability to tolerate lateral weightshifting in standing in order to transition to walking with less limitation. ? ?Target date: 09/27/21 ?Goal status: MET ? ? ?LONG TERM GOALS: ? ?Pt will achieve a FOTO score of 55% or higher in order to demonstrate improved functional ability as it relates to her L ankle pain. ?Baseline: 43 ?Target date:  10/25/21 ?Goal status: IN PROGRESS ? ?2.  Pt will demonstrate the ability to perform a 6-minute walk test without CAM boot or AD at self-selected pace in order to progress to safe community ambulation. ?Baseline: unable ?Target date: 10/25/21 ?Goal status: MET ? ?3.  Pt will demonstrate Lt ankle inversion/eversion AROM WNL with 0-2/10 pain in order to put on her socks and shoes without  limitation. ?Baseline: 18 inversion; 4 eversion  ?Target date: 10/25/21 ?Goal status: MET ? ?4.  Pt will demonstrate BIL global hip strength of 5/5 in order to independently progress her LE strengthening regimen without limit

## 2022-01-22 ENCOUNTER — Ambulatory Visit (INDEPENDENT_AMBULATORY_CARE_PROVIDER_SITE_OTHER): Payer: Medicare Other | Admitting: Podiatry

## 2022-01-22 ENCOUNTER — Ambulatory Visit: Payer: Medicare Other

## 2022-01-22 DIAGNOSIS — S96919D Strain of unspecified muscle and tendon at ankle and foot level, unspecified foot, subsequent encounter: Secondary | ICD-10-CM

## 2022-01-22 DIAGNOSIS — Z9889 Other specified postprocedural states: Secondary | ICD-10-CM

## 2022-01-22 DIAGNOSIS — M1990 Unspecified osteoarthritis, unspecified site: Secondary | ICD-10-CM

## 2022-01-22 DIAGNOSIS — M450 Ankylosing spondylitis of multiple sites in spine: Secondary | ICD-10-CM

## 2022-01-22 NOTE — Therapy (Signed)
?OUTPATIENT PHYSICAL THERAPY TREATMENT NOTE ? ? ?Patient Name: Haley White ?MRN: [id number] ?DOB:29-Sep-1956, 66 y.o., female ?Today's Date: 01/23/2022 ? ? PT End of Session - 01/23/22 1502   ? ? Visit Number 26   ? Number of Visits 33   ? Date for PT Re-Evaluation 02/23/22   ? Authorization Type MCR   ? Authorization - Visit Number 30   ? Authorization - Number of Visits 30   ? Progress Note Due on Visit 31   ? PT Start Time 1501   ? PT Stop Time 3710   ? PT Time Calculation (min) 44 min   ? Activity Tolerance Patient tolerated treatment well   ? Behavior During Therapy [facility] for tasks assessed/performed   ? ?  ?  ? ?  ? ? ? ? ?Past Medical History:  ?Diagnosis Date  ? Anxiety   ? Arthritis   ? Asthma   ? Cataract   ? Gout   ? Hyperlipidemia   ? Hypertension   ? Obese   ? ?Past Surgical History:  ?Procedure Laterality Date  ? APPENDECTOMY  1984  ? CATARACT EXTRACTION W/ INTRAOCULAR LENS  IMPLANT, BILATERAL  2014  ? [place]  ? X3  ? COLONOSCOPY  11/16/2013  ? w/Brodie   ? FOOT SURGERY Left 2002  ? KNEE SURGERY Left 1998  ? ROTATOR CUFF REPAIR Right 2007  ? TUBAL LIGATION  1994  ? ?Patient Active Problem List  ? Diagnosis Date Noted  ? Allergic rhinitis 03/15/2021  ? Allergic rhinitis due to animal (cat) (dog) hair and dander 03/15/2021  ? Allergic rhinitis due to pollen 03/15/2021  ? Gastro-esophageal reflux disease without esophagitis 03/15/2021  ? Mild persistent asthma, uncomplicated 62/69/4854  ? Aortic atherosclerosis ([place]) by Chest CT scan on 02/02/2018  01/10/2021  ? Asthma   ? Low back pain 03/17/2019  ? Lumbosacral spondylosis without myelopathy 03/17/2019  ? Cubital tunnel syndrome 10/14/2018  ? Closed fracture of distal end of right radius 07/09/2018  ? Pseudogout 12/09/2017  ? Major depressive disorder, recurrent, severe without psychotic features ([place])   ? 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 Suicide attempt by drug ingestion ([place])   ? Medication management 08/22/2014  ?  Vitamin D deficiency 08/22/2014  ? Hyperlipidemia, mixed 08/22/2014  ? Hypertension   ? Anxiety   ? Abnormal glucose   ? Class 2 severe obesity due to excess calories with serious comorbidity and body mass index (BMI) of 37.0 to 37.9 in adult [facility])   ? ? ?PCP: Unk Pinto, MD ? ?REFERRING PROVIDER: Trula Slade, DPM ? ?REFERRING DIAG: O27.035K (ICD-10-CM) - Tendon tear, ankle, subsequent encounter ? ?THERAPY DIAG:  ?Pain in left foot ? ?Muscle weakness (generalized) ? ?Difficulty in walking, not elsewhere classified ? ?Tendon tear, ankle, unspecified laterality, subsequent encounter ? ?Stiffness of left ankle, not elsewhere classified ? ?Status post left foot surgery ? ? ? ?SUBJECTIVE:  ? ?SUBJECTIVE STATEMENT: ?My ankle starts acting up before it rains.  I am a 3/10 today. I am having trouble with my knee.  I have an RX to work on the L knee.  I just want to get back to normal again. ? ?PERTINENT HISTORY: ?two Lt ankle surgeries within 8 months; HTN; anxiety; global OA ? ?PAIN:  ?Are you having pain? Yes ?NPRS scale: 3/10 ?Pain location: Lt lateral ankle, plantar foot  ?PAIN TYPE: tight, pulling  ?Pain description: intermittent  ?Aggravating factors: prolonged standing/walking  ?Relieving factors: unknown  ? ?  PRECAUTIONS: None ? ?WEIGHT BEARING RESTRICTIONS No ? ? ? ?OBJECTIVE:  ? IMAGING:  ?06/06/2021: MRI L Ankle Without Contrast: IMPRESSION: 1. 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 Long segment longitudinal split tear of the peroneus brevis tendon. Peroneus longus tendon is laterally subluxed. 2. Chronic Achilles tendinopathy with small partial-thickness interstitial tears distally, similar to prior. 3. Moderate arthropathy within the midfoot and TMT joints, not significantly progressed from prior. ? ?PATIENT SURVEYS:  ?FOTO 52% ? ?PALPATION: ?Diffuse tenderness about Lt lateral ankle ? ?LE AROM/PROM: ? ?A/PROM Right ?01/23/2022 Left ?01/23/2022  ?Hip flexion    ?Hip extension     ?Hip abduction    ?Hip adduction    ?Hip internal rotation    ?Hip external rotation    ?Knee flexion    ?Knee extension    ?Ankle dorsiflexion  WNL  ?Ankle plantarflexion  WNL  ?Ankle inversion  WNL  ?Ankle eversion  WNL mild pn   ? (Blank rows = not tested) ? ?LE MMT: ? ?MMT Right ?01/23/2022 Left ?01/23/2022  ?Hip flexion 5 5  ?Hip extension 4+ 4+  ?Hip abduction 4+ 4+  ?Hip adduction    ?Hip internal rotation    ?Hip external rotation    ?Knee flexion    ?Knee extension    ?Ankle dorsiflexion  5  ?Ankle plantarflexion 25 SL calf raises  Unable to complete SL calf raise   ?Ankle inversion  4+/5 pn  ?Ankle eversion  4+/5 pn  ? (Blank rows = not tested) ? ?FUNCTIONAL TESTS:  ?6 MWT: 1130 ft; 4/10 pain Lt lateral ankle ?SLS: 5 seconds LLE; 30 seconds RLE; 01/21/22: 5 seconds LLE ? ?GAIT: ?Distance walked: 6 MWT ?Assistive device utilized: None ?Level of assistance: Complete Independence ?Comments: no push off LLE, maintains Lt foot ER  ? ? ? ?TODAY'S TREATMENT: ? ?[place] Adult PT Treatment:                                                DATE: 01-23-22 ?Aquatics ?Pt seen for aquatic therapy today.  Treatment took place in water 3.25-4.8 ft in depth at the [employer] pool. Temp of water was 92?. Pt enters with flip flops and no AD but antalgic gait.  Pt entered/exited the pool via stairs  step to pattern independently with bilat rail ?  ?Walking forward, back and side stepping 10 widths each utilizing aquatic barbells and  2lb water weights each ankle ? ?Continuing eversion with stepping in the water.  Pt has been dominantly bearing weight over great toe and trying to unweight during eversion part of gait. Short foot and toe yoga before exercises with weights ? ?NMR: SLS R/L 3x30 sec standing on blue square pool noodle rocker forward and backward for uneven stepping.  Tandem walking back and forth 4 lengths on pool noodle. Side stepping on pool noodle x 4 lengths.  SLS with water perturbations by PT  ?  ?  ?Hip  abd/add R/L each and then using 1 UE support and 2 lb weight ?Hip ext/flex with knee straight  and 2 lb weight ?Marching and 2 lb weight ?Hip flex 90 with knee ext with 2 lb wt ?Figure 4 stretch SLS for 60 sec each with UE on pool wall ?Squats  ?Gastroc stretch bil on steps ?Runners stretch on R and L 30 sec x 2 and  then stretch in to runners hamstring stretch 2 x 30 sec R and L ?Ascend/Descend stairs in water to 5th step with alternating step pattern 4 times ?Submerged step step ups x 15 on L and R and with curtsy lunge L and R ?Heel raise with eccentric lowering of left ?Heel raise on left max 15 reps in water ?  ? Pt requires buoyancy for support and to offload joints with strengthening exercises. Viscosity of the water is needed for resistance of strengthening; water current perturbations provides challenge to standing balance unsupported, requiring increased core activation. ? ?[place] Adult PT Treatment:                                                DATE: 01/21/22 ?Therapeutic Exercise: ?NuStep level 5 x 5 minutes; UE/LE  ?Great toe flexor stretch 2 x 30 sec ?Great toe extension 2 x 10 ?Arch lift 2 x 10 ?Calf raise on foam attempted; pain  ?Calf stretch at wall x 60 seconds  ?Seated calf raise 2 x 15; 15 lb kettlebell  ?Standing toe raises 2 x 10  ?Updated HEP  ?NMR:    ?SLS 5 trials: 5 seconds LLE; 10-15 seconds RLE ? ? ? ?[facility] Adult PT Treatment:                                                DATE: 01/14/22 ?Therapeutic Exercise: ?NuStep level 5 x 5 minutes  ?Rockerboard 2 x 10 A/P and lateral  ?Calf stretch on wedge x 60 seconds  ?Lateral step ups on airex 2 x 10  ?Calf raise on foam 2 x 10  ?Ankle towel slide x 2  ?NMR:  ?Romberg on foam eyes closed 3 x 30  ?Semi tandem on foam 2 x 30 sec bilateral  ?Tandem on foam 1 x 30 sec bilateral  ?Updated HEP to include tandem stance  ? ? ? ?[facility] Adult PT Treatment:                                                DATE: 01/07/22 ?Therapeutic Exercise: ?Leg press 3 x 10 @ 20  lbs  ?SL calf raise on leg press attempted; discontinued due to pain ?6 minute walk  ?Seated calf raise 3 x 10 @ 10 lbs LLE  ? ?Therapeutic Activity: ?Re-assessment to determine overall progress, educati

## 2022-01-22 NOTE — Progress Notes (Signed)
SITUATION ?Reason for Consult: Follow-up with bilateral custom foot orthotics ?Patient / Caregiver Report: Patient has had foot reconstruction and wanted to check to make sure orthotics were still functional ? ?OBJECTIVE DATA ?History / Diagnosis:  ?  ICD-10-CM   ?1. Post-operative state  Z98.890   ?  ? ? ?Change in Pathology: Reconstructive surgery ? ?ACTIONS PERFORMED ?Patient's equipment was checked for structural stability and fit. Device(s) intact and fit is excellent. No replacement needed at this time. All questions answered and concerns addressed. ? ?PLAN ?Follow-up as needed (PRN). Plan of care discussed with and agreed upon by patient / caregiver. ? ?

## 2022-01-23 ENCOUNTER — Encounter: Payer: Self-pay | Admitting: Physical Therapy

## 2022-01-23 ENCOUNTER — Other Ambulatory Visit: Payer: Self-pay

## 2022-01-23 ENCOUNTER — Ambulatory Visit: Payer: Medicare Other | Admitting: Physical Therapy

## 2022-01-23 DIAGNOSIS — Z9889 Other specified postprocedural states: Secondary | ICD-10-CM

## 2022-01-23 DIAGNOSIS — M79672 Pain in left foot: Secondary | ICD-10-CM

## 2022-01-23 DIAGNOSIS — M25672 Stiffness of left ankle, not elsewhere classified: Secondary | ICD-10-CM

## 2022-01-23 DIAGNOSIS — S96919D Strain of unspecified muscle and tendon at ankle and foot level, unspecified foot, subsequent encounter: Secondary | ICD-10-CM

## 2022-01-23 DIAGNOSIS — M6281 Muscle weakness (generalized): Secondary | ICD-10-CM

## 2022-01-23 DIAGNOSIS — R262 Difficulty in walking, not elsewhere classified: Secondary | ICD-10-CM

## 2022-01-24 NOTE — Progress Notes (Signed)
Subjective: ?Haley White is a 66 y.o. is seen today in office s/p left peroneal tendon repair, anastomosis subsequently she had a fall and had a fracture posterior calcaneus.  She said that she has been doing better and she making progress.  She is wearing a regular shoe without the use of a cane or any assistive devices.  She is still doing physical therapy.  Since I last saw her she actually went on vacation to [place] and she did well.  She walked in the scene with an ankle brace.  She states that she did not have really any issues when she was on vacation.  ? ?Objective: ?General: No acute distress, AAOx3  ?DP/PT pulses palpable 2/4, CRT < 3 sec to all digits.  ?Protective sensation intact. Motor function intact.  ?Left foot: Incision site along the peroneal tendon has healed with a scar.  There is also mild discomfort residual along the course the peroneal tendon posterior, inferior to the lateral malleolus.  Trace edema but there is no erythema or warmth.  There is no pain in the posterior calcaneus.  Flexor, extensor tendons appear to be intact.  MMT 5/5. ?No pain with calf compression, swelling, warmth, erythema.  ? ?MRI 09/08/2021 ?IMPRESSION: ?1. Insufficiency fracture of the calcaneal tuberosity superiorly, ?near the Achilles insertion, as can be seen associated with ?diabetes. No displaced fracture or Achilles tendon tear identified. ?2. Postsurgical changes related to repair of the previously ?demonstrated longitudinal split tear of the peroneus brevis tendon. ?No new tendon findings. ?3. Stable subchondral cysts in the lateral talar dome and midfoot ?degenerative changes. No other acute osseous findings. ? ?Assessment and Plan:  ?Status post left peroneal tendon anastomosis; calcaneal fracture ? ?-Treatment options discussed including all alternatives, risks, and complications ?-She is continuing to make good progress.  Continue with physical therapy.  I would still use the ankle brace if needed for  uneven surfaces. ?-She is concerned about arthritis overall and asked what she can do.  This is likely osteoarthritis.  We did do some blood work to rule out any underlying autoimmune issues causing this. ? ?Trula Slade DPM ? ?

## 2022-01-28 ENCOUNTER — Other Ambulatory Visit: Payer: Self-pay | Admitting: Podiatry

## 2022-01-28 ENCOUNTER — Encounter (HOSPITAL_BASED_OUTPATIENT_CLINIC_OR_DEPARTMENT_OTHER): Payer: Self-pay | Admitting: Physical Therapy

## 2022-01-28 ENCOUNTER — Other Ambulatory Visit: Payer: Self-pay

## 2022-01-28 ENCOUNTER — Ambulatory Visit (HOSPITAL_BASED_OUTPATIENT_CLINIC_OR_DEPARTMENT_OTHER): Payer: Medicare Other | Attending: Internal Medicine | Admitting: Physical Therapy

## 2022-01-28 DIAGNOSIS — M79672 Pain in left foot: Secondary | ICD-10-CM | POA: Insufficient documentation

## 2022-01-28 DIAGNOSIS — M25672 Stiffness of left ankle, not elsewhere classified: Secondary | ICD-10-CM | POA: Diagnosis present

## 2022-01-28 DIAGNOSIS — R262 Difficulty in walking, not elsewhere classified: Secondary | ICD-10-CM | POA: Diagnosis present

## 2022-01-28 DIAGNOSIS — S96919D Strain of unspecified muscle and tendon at ankle and foot level, unspecified foot, subsequent encounter: Secondary | ICD-10-CM | POA: Diagnosis present

## 2022-01-28 DIAGNOSIS — M6281 Muscle weakness (generalized): Secondary | ICD-10-CM | POA: Insufficient documentation

## 2022-01-28 DIAGNOSIS — Z9889 Other specified postprocedural states: Secondary | ICD-10-CM | POA: Insufficient documentation

## 2022-01-28 DIAGNOSIS — M1990 Unspecified osteoarthritis, unspecified site: Secondary | ICD-10-CM

## 2022-01-28 LAB — C-REACTIVE PROTEIN: CRP: 7 mg/L (ref 0–10)

## 2022-01-28 LAB — RHEUMATOID FACTOR: Rheumatoid fact SerPl-aCnc: 10.7 IU/mL (ref <14.0)

## 2022-01-28 LAB — SEDIMENTATION RATE: Sed Rate: 19 mm/hr (ref 0–40)

## 2022-01-28 LAB — HLA-B27 ANTIGEN: HLA B27: POSITIVE

## 2022-01-28 LAB — ANA: Anti Nuclear Antibody (ANA): NEGATIVE

## 2022-01-28 NOTE — Therapy (Signed)
?OUTPATIENT PHYSICAL THERAPY TREATMENT NOTE ? ? ?Patient Name: Haley White ?MRN: [id number] ?DOB:[birth year], 66 y.o., female ?Today's Date: 01/28/2022 ? ? PT End of Session - 01/28/22 1202   ? ? Visit Number 27   ? Number of Visits 33   ? Date for PT Re-Evaluation 02/23/22   ? Authorization Type MCR   ? Authorization - Visit Number 30   ? Authorization - Number of Visits 30   ? Progress Note Due on Visit 31   ? PT Start Time 1200   ? PT Stop Time 2440   ? PT Time Calculation (min) 45 min   ? Activity Tolerance Patient tolerated treatment well   ? Behavior During Therapy [facility] for tasks assessed/performed   ? ?  ?  ? ?  ? ? ? ? ?Past Medical History:  ?Diagnosis Date  ? Anxiety   ? Arthritis   ? Asthma   ? Cataract   ? Gout   ? Hyperlipidemia   ? Hypertension   ? Obese   ? ?Past Surgical History:  ?Procedure Laterality Date  ? APPENDECTOMY  1984  ? CATARACT EXTRACTION W/ INTRAOCULAR LENS  IMPLANT, BILATERAL  2014  ? [place]  ? X3  ? COLONOSCOPY  11/16/2013  ? w/[facility]rodie   ? FOOT SURGERY Left 2002  ? KNEE SURGERY Left 1998  ? ROTATOR CUFF REPAIR Right 2007  ? TUBAL LIGATION  1994  ? ?Patient Active Problem List  ? Diagnosis Date Noted  ? Allergic rhinitis 03/15/2021  ? Allergic rhinitis due to animal (cat) (dog) hair and dander 03/15/2021  ? Allergic rhinitis due to pollen 03/15/2021  ? Gastro-esophageal reflux disease without esophagitis 03/15/2021  ? Mild persistent asthma, uncomplicated 08/31/2535  ? Aortic atherosclerosis ([place]) by Chest CT scan on 02/02/2018  01/10/2021  ? Asthma   ? Low back pain 03/17/2019  ? Lumbosacral spondylosis without myelopathy 03/17/2019  ? Cubital tunnel syndrome 10/14/2018  ? Closed fracture of distal end of right radius 07/09/2018  ? Pseudogout 12/09/2017  ? Major depressive disorder, recurrent, severe without psychotic features ([place])   ? 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 Suicide attempt by drug ingestion ([place])   ? Medication management 08/22/2014  ?  Vitamin D deficiency 08/22/2014  ? Hyperlipidemia, mixed 08/22/2014  ? Hypertension   ? Anxiety   ? Abnormal glucose   ? Class 2 severe obesity due to excess calories with serious comorbidity and body mass index (BMI) of 37.0 to 37.9 in adult [facility])   ? ? ?PCP: Unk Pinto, MD ? ?REFERRING PROVIDER: Trula Slade, DPM ? ?REFERRING DIAG: U44.034V (ICD-10-CM) - Tendon tear, ankle, subsequent encounter ? ?THERAPY DIAG:  ?Pain in left foot ? ?Muscle weakness (generalized) ? ?Difficulty in walking, not elsewhere classified ? ?Tendon tear, ankle, unspecified laterality, subsequent encounter ? ?Stiffness of left ankle, not elsewhere classified ? ?Status post left foot surgery ? ? ? ?SUBJECTIVE:  ? ?SUBJECTIVE STATEMENT: ?"Doing well, pain low" ?PERTINENT HISTORY: ?two Lt ankle surgeries within 8 months; HTN; anxiety; global OA ? ?PAIN:  ?Are you having pain? Yes ?NPRS scale: 1-2/10 ?Pain location: Lt lateral ankle, plantar foot  ?PAIN TYPE: tight, pulling  ?Pain description: intermittent  ?Aggravating factors: prolonged standing/walking  ?Relieving factors: unknown  ? ?PRECAUTIONS: None ? ?WEIGHT BEARING RESTRICTIONS No ? ? ? ?OBJECTIVE:  ? IMAGING:  ?06/06/2021: MRI L Ankle Without Contrast: IMPRESSION: 1. 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 Long segment longitudinal split tear  of the peroneus brevis tendon. Peroneus longus tendon is laterally subluxed. 2. Chronic Achilles tendinopathy with small partial-thickness interstitial tears distally, similar to prior. 3. Moderate arthropathy within the midfoot and TMT joints, not significantly progressed from prior. ? ?PATIENT SURVEYS:  ?FOTO 52% ? ?PALPATION: ?Diffuse tenderness about Lt lateral ankle ? ?LE AROM/PROM: ? ?A/PROM Right ?01/28/2022 Left ?01/28/2022  ?Hip flexion    ?Hip extension    ?Hip abduction    ?Hip adduction    ?Hip internal rotation    ?Hip external rotation    ?Knee flexion    ?Knee extension    ?Ankle dorsiflexion  WNL   ?Ankle plantarflexion  WNL  ?Ankle inversion  WNL  ?Ankle eversion  WNL mild pn   ? (Blank rows = not tested) ? ?LE MMT: ? ?MMT Right ?01/28/2022 Left ?01/28/2022  ?Hip flexion 5 5  ?Hip extension 4+ 4+  ?Hip abduction 4+ 4+  ?Hip adduction    ?Hip internal rotation    ?Hip external rotation    ?Knee flexion    ?Knee extension    ?Ankle dorsiflexion  5  ?Ankle plantarflexion 25 SL calf raises  Unable to complete SL calf raise   ?Ankle inversion  4+/5 pn  ?Ankle eversion  4+/5 pn  ? (Blank rows = not tested) ? ?FUNCTIONAL TESTS:  ?6 MWT: 1130 ft; 4/10 pain Lt lateral ankle ?SLS: 5 seconds LLE; 30 seconds RLE; 01/21/22: 5 seconds LLE ? ?GAIT: ?Distance walked: 6 MWT ?Assistive device utilized: None ?Level of assistance: Complete Independence ?Comments: no push off LLE, maintains Lt foot ER  ? ? ? ?TODAY'S TREATMENT: ? ? ?[place] Adult PT Treatment:                                                DATE: 01-28-22 ?Aquatics ?Pt seen for aquatic therapy today.  Treatment took place in water 3.25-4.8 ft in depth at the [employer] pool. Temp of water was 92?. Pt enters with flip flops and no AD but antalgic gait.  Pt entered/exited the pool via stairs  step to pattern independently with bilat rail ? ?Walking forward, back and side stepping 4 widths each ?Tandem forward and back 2 widths ea, yellow hand buoys for balance ? ?NMR: SLS R/L bottom of pool R/L 2x30s ; blue square pool noodle rocker forward and backward 2x30s. ?-Tandem stance on blue noodle 2x30s Leading R/L ?-inversion 2x10 ? ?SLS R/L x30s hand buoys for ue support and balance ?Figure 4 stretch 2x20s R/L ?IT band stretch 2x20s R/L (Also completed in sitting) ? ?Seated flutter kicking (SLR with DF/PF) 4x20 ?Add/abd 3x20 reps (ankle in df) ? ?Toe walking and heel walking 3 widths. ?Df; PF 2x10 pool bottom ?-lle submerged in 4 ft Df/pf 2x10 ? ?Pt requires buoyancy for support and to offload joints with strengthening exercises. Viscosity of the water is needed for  resistance of strengthening; water current perturbations provides challenge to standing balance unsupported, requiring increased core activation.  ? ? ?[facility] Adult PT Treatment:                                                DATE: 01-23-22 ?Aquatics ?Pt seen for aquatic therapy today.  Treatment took place  in water 3.25-4.8 ft in depth at the [employer] pool. Temp of water was 92?. Pt enters with flip flops and no AD but antalgic gait.  Pt entered/exited the pool via stairs  step to pattern independently with bilat rail ?  ?Walking forward, back and side stepping 10 widths each utilizing aquatic barbells and  2lb water weights each ankle ?  ?Continuing eversion with stepping in the water.  Pt has been dominantly bearing weight over great toe and trying to unweight during eversion part of gait. Short foot and toe yoga before exercises with weights ?  ?NMR: SLS R/L 3x30 sec standing on blue square pool noodle rocker forward and backward for uneven stepping.  Tandem walking back and forth 4 lengths on pool noodle. Side stepping on pool noodle x 4 lengths.  SLS with water perturbations by PT  ?  ?  ?Hip abd/add R/L each and then using 1 UE support and 2 lb weight ?Hip ext/flex with knee straight  and 2 lb weight ?Marching and 2 lb weight ?Hip flex 90 with knee ext with 2 lb wt ?Figure 4 stretch SLS for 60 sec each with UE on pool wall ?Squats  ?Gastroc stretch bil on steps ?Runners stretch on R and L 30 sec x 2 and then stretch in to runners hamstring stretch 2 x 30 sec R and L ?Ascend/Descend stairs in water to 5th step with alternating step pattern 4 times ?Submerged step step ups x 15 on L and R and with curtsy lunge L and R ?Heel raise with eccentric lowering of left ?Heel raise on left max 15 reps in water ?  ? Pt requires buoyancy for support and to offload joints with strengthening exercises. Viscosity of the water is needed for resistance of strengthening; water current perturbations provides challenge  to standing balance unsupported, requiring increased core activation. ? ?[place] Adult PT Treatment:                                                DATE: 01/21/22 ?Therapeutic Exercise: ?NuStep level 5 x 5 minut

## 2022-01-29 ENCOUNTER — Encounter: Payer: Managed Care, Other (non HMO) | Admitting: Physical Therapy

## 2022-01-30 ENCOUNTER — Other Ambulatory Visit: Payer: Self-pay

## 2022-01-30 ENCOUNTER — Ambulatory Visit: Payer: Managed Care, Other (non HMO) | Admitting: Physical Therapy

## 2022-01-30 ENCOUNTER — Ambulatory Visit: Payer: Medicare Other | Admitting: Physical Therapy

## 2022-01-30 DIAGNOSIS — Z9889 Other specified postprocedural states: Secondary | ICD-10-CM

## 2022-01-30 DIAGNOSIS — M79672 Pain in left foot: Secondary | ICD-10-CM | POA: Diagnosis not present

## 2022-01-30 DIAGNOSIS — R262 Difficulty in walking, not elsewhere classified: Secondary | ICD-10-CM

## 2022-01-30 DIAGNOSIS — S96919D Strain of unspecified muscle and tendon at ankle and foot level, unspecified foot, subsequent encounter: Secondary | ICD-10-CM

## 2022-01-30 DIAGNOSIS — M25672 Stiffness of left ankle, not elsewhere classified: Secondary | ICD-10-CM

## 2022-01-30 DIAGNOSIS — M6281 Muscle weakness (generalized): Secondary | ICD-10-CM

## 2022-01-30 NOTE — Therapy (Signed)
?OUTPATIENT PHYSICAL THERAPY TREATMENT NOTE ? ? ?Patient Name: Haley White ?MRN: [id number] ?DOB:13-Oct-1956, 66 y.o., female ?Today's Date: 01/30/2022 ? ? PT End of Session - 01/30/22 1145   ? ? Visit Number 28   ? Number of Visits 33   ? Date for PT Re-Evaluation 02/23/22   ? Authorization Type MCR   ? PT Start Time 1100   ? PT Stop Time 1145   ? PT Time Calculation (min) 45 min   ? Activity Tolerance Patient tolerated treatment well   ? Behavior During Therapy [facility] for tasks assessed/performed   ? ?  ?  ? ?  ? ? ? ? ? ?Past Medical History:  ?Diagnosis Date  ? Anxiety   ? Arthritis   ? Asthma   ? Cataract   ? Gout   ? Hyperlipidemia   ? Hypertension   ? Obese   ? ?Past Surgical History:  ?Procedure Laterality Date  ? APPENDECTOMY  1984  ? CATARACT EXTRACTION W/ INTRAOCULAR LENS  IMPLANT, BILATERAL  2014  ? [place]  ? X3  ? COLONOSCOPY  11/16/2013  ? w/[place]   ? FOOT SURGERY Left 2002  ? KNEE SURGERY Left 1998  ? ROTATOR CUFF REPAIR Right 2007  ? TUBAL LIGATION  1994  ? ?Patient Active Problem List  ? Diagnosis Date Noted  ? Allergic rhinitis 03/15/2021  ? Allergic rhinitis due to animal (cat) (dog) hair and dander 03/15/2021  ? Allergic rhinitis due to pollen 03/15/2021  ? Gastro-esophageal reflux disease without esophagitis 03/15/2021  ? Mild persistent asthma, uncomplicated 93/26/7124  ? Aortic atherosclerosis ([place]) by Chest CT scan on 02/02/2018  01/10/2021  ? Asthma   ? Low back pain 03/17/2019  ? Lumbosacral spondylosis without myelopathy 03/17/2019  ? Cubital tunnel syndrome 10/14/2018  ? Closed fracture of distal end of right radius 07/09/2018  ? Pseudogout 12/09/2017  ? Major depressive disorder, recurrent, severe without psychotic features ([place])   ? 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 Suicide attempt by drug ingestion ([place])   ? Medication management 08/22/2014  ? Vitamin D deficiency 08/22/2014  ? Hyperlipidemia, mixed 08/22/2014  ? Hypertension   ? Anxiety   ? Abnormal  glucose   ? Class 2 severe obesity due to excess calories with serious comorbidity and body mass index (BMI) of 37.0 to 37.9 in adult [facility])   ? ? ?PCP: Unk Pinto, MD ? ?REFERRING PROVIDER: Trula Slade, DPM ? ?REFERRING DIAG: P80.998P (ICD-10-CM) - Tendon tear, ankle, subsequent encounter ? ?THERAPY DIAG:  ?Pain in left foot ? ?Muscle weakness (generalized) ? ?Difficulty in walking, not elsewhere classified ? ?Tendon tear, ankle, unspecified laterality, subsequent encounter ? ?Stiffness of left ankle, not elsewhere classified ? ?Status post left foot surgery ? ? ? ?SUBJECTIVE:  ? ?SUBJECTIVE STATEMENT: ?No complaints.  The hardest thing is going down steps.  ? ?PERTINENT HISTORY: ?two Lt ankle surgeries within 8 months; HTN; anxiety; global OA ? ?PAIN:  ?Are you having pain? Yes ?NPRS scale: 1-2/10 ?Pain location: Lt lateral ankle, plantar foot  ?PAIN TYPE: tight, pulling  ?Pain description: intermittent  ?Aggravating factors: prolonged standing/walking  ?Relieving factors: unknown  ? ?PRECAUTIONS: None ? ?WEIGHT BEARING RESTRICTIONS No ? ? ? ?OBJECTIVE:  ? IMAGING:  ?06/06/2021: MRI L Ankle Without Contrast: IMPRESSION: 1. 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 Long segment longitudinal split tear of the peroneus brevis tendon. Peroneus longus tendon is laterally subluxed. 2. Chronic Achilles tendinopathy with small  partial-thickness interstitial tears distally, similar to prior. 3. Moderate arthropathy within the midfoot and TMT joints, not significantly progressed from prior. ? ?PATIENT SURVEYS:  ?FOTO 52% ? ?PALPATION: ?Diffuse tenderness about Lt lateral ankle ? ?LE AROM/PROM: ? ?A/PROM Right ?01/30/2022 Left ?01/30/2022  ?Hip flexion    ?Hip extension    ?Hip abduction    ?Hip adduction    ?Hip internal rotation    ?Hip external rotation    ?Knee flexion    ?Knee extension    ?Ankle dorsiflexion  WNL  ?Ankle plantarflexion  WNL  ?Ankle inversion  WNL  ?Ankle eversion  WNL  mild pn   ? (Blank rows = not tested) ? ?LE MMT: ? ?MMT Right ?01/30/2022 Left ?01/30/2022  ?Hip flexion 5 5  ?Hip extension 4+ 4+  ?Hip abduction 4+ 4+  ?Hip adduction    ?Hip internal rotation    ?Hip external rotation    ?Knee flexion    ?Knee extension    ?Ankle dorsiflexion  5  ?Ankle plantarflexion 25 SL calf raises  Unable to complete SL calf raise   ?Ankle inversion  4+/5 pn  ?Ankle eversion  4+/5 pn  ? (Blank rows = not tested) ? ?FUNCTIONAL TESTS:  ?6 MWT: 1130 ft; 4/10 pain Lt lateral ankle ?SLS: 5 seconds LLE; 30 seconds RLE; 01/21/22: 5 seconds LLE ? ?GAIT: ?Distance walked: 6 MWT ?Assistive device utilized: None ?Level of assistance: Complete Independence ?Comments: no push off LLE, maintains Lt foot ER  ? ? ? ?TODAY'S TREATMENT: ? ?[place] Adult PT Treatment:                                                DATE: 01/30/22 ?Therapeutic Exercise: ?NuStep L5 UE and LE x 5 min  ?Reformer footwork 2 red 1 blue: Heels, arch and toes in parallel and then turnout all with double leg. 2 red 1 Green heels with turnout x 15 ?Alternating calf raise pain with full DF, modified ROM ?2 red calf raise x 15 ?Single leg press out 2 red 1 yellow  ?Windshield wiper for hip IR  ?SLS on foam , painful, unable to do > 5 sec without UE  ?Narrow stance on Airex   ?Tandem on Airex with no UE 30 sec each side ?Tandem on Airex with head turns 45 sec  ?Self Care: ?Tight hips (IR) vs ankle eversion ?  ?[facility] Adult PT Treatment:                                                DATE: 01-28-22 ?Aquatics ? ? ?Pt seen for aquatic therapy today.  Treatment took place in water 3.25-4.8 ft in depth at the [employer] pool. Temp of water was 92?. Pt enters with flip flops and no AD but antalgic gait.  Pt entered/exited the pool via stairs  step to pattern independently with bilat rail ? ?Walking forward, back and side stepping 4 widths each ?Tandem forward and back 2 widths ea, yellow hand buoys for balance ? ?NMR: SLS R/L bottom of pool R/L  2x30s ; blue square pool noodle rocker forward and backward 2x30s. ?-Tandem stance on blue noodle 2x30s Leading R/L ?-inversion 2x10 ? ?SLS R/L x30s hand buoys for  ue support and balance ?Figure 4 stretch 2x20s R/L ?IT band stretch 2x20s R/L (Also completed in sitting) ? ?Seated flutter kicking (SLR with DF/PF) 4x20 ?Add/abd 3x20 reps (ankle in df) ? ?Toe walking and heel walking 3 widths. ?Df; PF 2x10 pool bottom ?-lle submerged in 4 ft Df/pf 2x10 ? ?Pt requires buoyancy for support and to offload joints with strengthening exercises. Viscosity of the water is needed for resistance of strengthening; water current perturbations provides challenge to standing balance unsupported, requiring increased core activation.  ? ? ?[facility] Adult PT Treatment:                                                DATE: 01-23-22 ?Aquatics ?Pt seen for aquatic therapy today.  Treatment took place in water 3.25-4.8 ft in depth at the [employer] pool. Temp of water was 92?. Pt enters with flip flops and no AD but antalgic gait.  Pt entered/exited the pool via stairs  step to pattern independently with bilat rail ?  ?Walking forward, back and side stepping 10 widths each utilizing aquatic barbells and  2lb water weights each ankle ?  ?Continuing eversion with stepping in the water.  Pt has been dominantly bearing weight over great toe and trying to unweight during eversion part of gait. Short foot and toe yoga before exercises with weights ?  ?NMR: SLS R/L 3x30 sec standing on blue square pool noodle rocker forward and backward for uneven stepping.  Tandem walking back and forth 4 lengths on pool noodle. Side stepping on pool noodle x 4 lengths.  SLS with water perturbations by PT  ?  ?  ?Hip abd/add R/L each and then using 1 UE support and 2 lb weight ?Hip ext/flex with knee straight  and 2 lb weight ?Marching and 2 lb weight ?Hip flex 90 with knee ext with 2 lb wt ?Figure 4 stretch SLS for 60 sec each with UE on pool wall ?Squats   ?Gastroc stretch bil on steps ?Runners stretch on R and L 30 sec x 2 and then stretch in to runners hamstring stretch 2 x 30 sec R and L ?Ascend/Descend stairs in water to 5th step with alternating step patte

## 2022-02-01 ENCOUNTER — Encounter: Payer: Self-pay | Admitting: Podiatry

## 2022-02-03 ENCOUNTER — Encounter (HOSPITAL_BASED_OUTPATIENT_CLINIC_OR_DEPARTMENT_OTHER): Payer: Self-pay | Admitting: Physical Therapy

## 2022-02-03 DIAGNOSIS — Z62820 Parent-biological child conflict: Secondary | ICD-10-CM | POA: Diagnosis not present

## 2022-02-03 DIAGNOSIS — F331 Major depressive disorder, recurrent, moderate: Secondary | ICD-10-CM | POA: Diagnosis not present

## 2022-02-03 DIAGNOSIS — F411 Generalized anxiety disorder: Secondary | ICD-10-CM | POA: Diagnosis not present

## 2022-02-03 DIAGNOSIS — R69 Illness, unspecified: Secondary | ICD-10-CM | POA: Diagnosis not present

## 2022-02-04 NOTE — Therapy (Signed)
?OUTPATIENT PHYSICAL THERAPY TREATMENT NOTE ? ? ?Patient Name: BENJAMIN MERRIHEW ?MRN: [id number] ?DOB:Mar 20, 1956, 66 y.o., female ?Today's Date: 02/05/2022 ? ? PT End of Session - 02/05/22 0936   ? ? Visit Number 29   ? Number of Visits 33   ? Date for PT Re-Evaluation 02/23/22   ? Authorization Type MCR   ? PT Start Time 9628   ? PT Stop Time 1020   ? PT Time Calculation (min) 45 min   ? Activity Tolerance Patient tolerated treatment well   ? Behavior During Therapy [facility] for tasks assessed/performed   ? ?  ?  ? ?  ? ? ? ? ? ? ?Past Medical History:  ?Diagnosis Date  ? Anxiety   ? Arthritis   ? Asthma   ? Cataract   ? Gout   ? Hyperlipidemia   ? Hypertension   ? Obese   ? ?Past Surgical History:  ?Procedure Laterality Date  ? APPENDECTOMY  1984  ? CATARACT EXTRACTION W/ INTRAOCULAR LENS  IMPLANT, BILATERAL  2014  ? [place]  ? X3  ? COLONOSCOPY  11/16/2013  ? w/[place]   ? FOOT SURGERY Left 2002  ? KNEE SURGERY Left 1998  ? ROTATOR CUFF REPAIR Right 2007  ? TUBAL LIGATION  1994  ? ?Patient Active Problem List  ? Diagnosis Date Noted  ? Allergic rhinitis 03/15/2021  ? Allergic rhinitis due to animal (cat) (dog) hair and dander 03/15/2021  ? Allergic rhinitis due to pollen 03/15/2021  ? Gastro-esophageal reflux disease without esophagitis 03/15/2021  ? Mild persistent asthma, uncomplicated 36/62/9476  ? Aortic atherosclerosis ([place]) by Chest CT scan on 02/02/2018  01/10/2021  ? Asthma   ? Low back pain 03/17/2019  ? Lumbosacral spondylosis without myelopathy 03/17/2019  ? Cubital tunnel syndrome 10/14/2018  ? Closed fracture of distal end of right radius 07/09/2018  ? Pseudogout 12/09/2017  ? Major depressive disorder, recurrent, severe without psychotic features ([place])   ? 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 Suicide attempt by drug ingestion ([place])   ? Medication management 08/22/2014  ? Vitamin D deficiency 08/22/2014  ? Hyperlipidemia, mixed 08/22/2014  ? Hypertension   ? Anxiety   ? Abnormal  glucose   ? Class 2 severe obesity due to excess calories with serious comorbidity and body mass index (BMI) of 37.0 to 37.9 in adult [facility])   ? ? ?PCP: Unk Pinto, MD ? ?REFERRING PROVIDER: Trula Slade, DPM ? ?REFERRING DIAG: L46.503T (ICD-10-CM) - Tendon tear, ankle, subsequent encounter ? ?THERAPY DIAG:  ?Pain in left foot ? ?Muscle weakness (generalized) ? ?Difficulty in walking, not elsewhere classified ? ?Tendon tear, ankle, unspecified laterality, subsequent encounter ? ?Stiffness of left ankle, not elsewhere classified ? ?Status post left foot surgery ? ? ? ?SUBJECTIVE:  ? ?SUBJECTIVE STATEMENT: ?My foot is 0/10 and my left knee is about a 0/10 without getting on my 3/10 on elliptical for 3 minutes.  I got my Silver Sneakers.  I had blood test and I had one blood test came back positive.  My doctor is referring me to a rheumatologist.  I have lost 20 lbs ?No complaints.  The hardest thing is going down steps.  ? ?PERTINENT HISTORY: ?two Lt ankle surgeries within 8 months; HTN; anxiety; global OA ? ?PAIN:  ?Are you having pain? Yes ?NPRS scale: 0/10 in left foot at rest.  My knee is fine as long as I am not on an elliptical ?Pain location: Lt lateral  ankle, plantar foot  ?PAIN TYPE: tight, pulling  ?Pain description: intermittent  ?Aggravating factors: prolonged standing/walking  ?Relieving factors: unknown  ? ?PRECAUTIONS: None ? ?WEIGHT BEARING RESTRICTIONS No ? ? ? ?OBJECTIVE:  ? IMAGING:  ?06/06/2021: MRI L Ankle Without Contrast: IMPRESSION: 1. 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 Long segment longitudinal split tear of the peroneus brevis tendon. Peroneus longus tendon is laterally subluxed. 2. Chronic Achilles tendinopathy with small partial-thickness interstitial tears distally, similar to prior. 3. Moderate arthropathy within the midfoot and TMT joints, not significantly progressed from prior. ? ?PATIENT SURVEYS:  ?FOTO 52% ? ?PALPATION: ?Diffuse tenderness  about Lt lateral ankle ? ?LE AROM/PROM: ? ?A/PROM Right ?02/05/2022 Left ?02/05/2022  ?Hip flexion    ?Hip extension    ?Hip abduction    ?Hip adduction    ?Hip internal rotation    ?Hip external rotation    ?Knee flexion    ?Knee extension    ?Ankle dorsiflexion  WNL  ?Ankle plantarflexion  WNL  ?Ankle inversion  WNL  ?Ankle eversion  WNL mild pn   ? (Blank rows = not tested) ? ?LE MMT: ? ?MMT Right ?02/05/2022 Left ?02/05/2022  ?Hip flexion 5 5  ?Hip extension 4+ 4+  ?Hip abduction 4+ 4+  ?Hip adduction    ?Hip internal rotation    ?Hip external rotation    ?Knee flexion    ?Knee extension    ?Ankle dorsiflexion  5  ?Ankle plantarflexion 25 SL calf raises  Unable to complete SL calf raise   ?Ankle inversion  4+/5 pn  ?Ankle eversion  4+/5 pn  ? (Blank rows = not tested) ? ?FUNCTIONAL TESTS:  ?6 MWT: 1130 ft; 4/10 pain Lt lateral ankle ?02/05/22  6 MWT: 1356  Pain 4/10 ft; pain Lt lateral ankle ?SLS: 5 seconds LLE; 30 seconds RLE; 01/21/22: 5 seconds LLE ? ?GAIT: ?Distance walked: 6 MWT ?Assistive device utilized: None ?Level of assistance: Complete Independence ?Comments: no push off LLE, maintains Lt foot ER  ? ? ? ?TODAY'S TREATMENT: ? ?[place] Adult PT Treatment:                                                DATE: 02-05-22 ? ?6 MWT: 1356  Pain 4/10 ft; pain Lt lateral ankle ? ?Therapeutic Exercise: ?Attempted Elliptical for 3 min but pain increased in lateral foot so DC's  ?GTB resisted DF, EV,IN and PF x 15 each then performed 6 MWT ?Sitting heel raises 15 x bil ?Slant board gastroc stretch 3 x 30 sec by treadmill UE rail ?Reformer footwork 2 red 1 blue: Heels, arch and toes in parallel and then turnout all with double leg. 2 red 1 Green heels with turnout x 15 ?Alternating  DF stretch on reformer 2 Red ? ?Neuromuscular re-ed: ?SLS on foam , painful, unable to do > 5 sec without UE working on bil SLS for 3 minutes  ?Narrow stance on Airex   ?Tandem on Airex with no UE 30 sec each side ?Tandem on Airex with head turns 45 sec   ? ?Modalities: ?Ice massage 8 min ? ? ?[facility] Adult PT Treatment:  DATE: 01/30/22 ?Therapeutic Exercise: ?NuStep L5 UE and LE x 5 min  ?Reformer footwork 2 red 1 blue: Heels, arch and toes in parallel and then turnout all with double leg. 2 red 1 Green heels with turnout x 15 ?Alternating calf raise pain with full DF, modified ROM ?2 red calf raise x 15 with min pain 2/10 ?Single leg press out 2 red 1 yellow  ?Windshield wiper for hip IR  ?SLS on foam , painful, unable to do > 5 sec without UE  ?Narrow stance on Airex   ?Tandem on Airex with no UE 30 sec each side ?Tandem on Airex with head turns 45 sec  ?Self Care: ?Tight hips (IR) vs ankle eversion ?  ?[facility] Adult PT Treatment:                                                DATE: 01-28-22 ?Aquatics ? ? ?Pt seen for aquatic therapy today.  Treatment took place in water 3.25-4.8 ft in depth at the [employer] pool. Temp of water was 92?. Pt enters with flip flops and no AD but antalgic gait.  Pt entered/exited the pool via stairs  step to pattern independently with bilat rail ? ?Walking forward, back and side stepping 4 widths each ?Tandem forward and back 2 widths ea, yellow hand buoys for balance ? ?NMR: SLS R/L bottom of pool R/L 2x30s ; blue square pool noodle rocker forward and backward 2x30s. ?-Tandem stance on blue noodle 2x30s Leading R/L ?-inversion 2x10 ? ?SLS R/L x30s hand buoys for ue support and balance ?Figure 4 stretch 2x20s R/L ?IT band stretch 2x20s R/L (Also completed in sitting) ? ?Seated flutter kicking (SLR with DF/PF) 4x20 ?Add/abd 3x20 reps (ankle in df) ? ?Toe walking and heel walking 3 widths. ?Df; PF 2x10 pool bottom ?-lle submerged in 4 ft Df/pf 2x10 ? ?Pt requires buoyancy for support and to offload joints with strengthening exercises. Viscosity of the water is needed for resistance of strengthening; water current perturbations provides challenge to standing balance unsupported, requiring  increased core activation.  ? ? ?[facility] Adult PT Treatment:                                                DATE: 01-23-22 ?Aquatics ?Pt seen for aquatic therapy today.  Treatment took place in water 3.25-4.8 f

## 2022-02-04 NOTE — Telephone Encounter (Signed)
Ammie- can you follow up on the rheumatologist referral?  ?

## 2022-02-05 ENCOUNTER — Ambulatory Visit: Payer: Medicare HMO | Attending: Podiatry | Admitting: Physical Therapy

## 2022-02-05 ENCOUNTER — Encounter: Payer: Self-pay | Admitting: Physical Therapy

## 2022-02-05 DIAGNOSIS — M6281 Muscle weakness (generalized): Secondary | ICD-10-CM | POA: Diagnosis not present

## 2022-02-05 DIAGNOSIS — Z9889 Other specified postprocedural states: Secondary | ICD-10-CM | POA: Insufficient documentation

## 2022-02-05 DIAGNOSIS — S96919D Strain of unspecified muscle and tendon at ankle and foot level, unspecified foot, subsequent encounter: Secondary | ICD-10-CM | POA: Diagnosis not present

## 2022-02-05 DIAGNOSIS — M25672 Stiffness of left ankle, not elsewhere classified: Secondary | ICD-10-CM | POA: Insufficient documentation

## 2022-02-05 DIAGNOSIS — J301 Allergic rhinitis due to pollen: Secondary | ICD-10-CM | POA: Diagnosis not present

## 2022-02-05 DIAGNOSIS — M79672 Pain in left foot: Secondary | ICD-10-CM | POA: Insufficient documentation

## 2022-02-05 DIAGNOSIS — R262 Difficulty in walking, not elsewhere classified: Secondary | ICD-10-CM | POA: Insufficient documentation

## 2022-02-06 NOTE — Telephone Encounter (Signed)
Called and patient has been scheduled for 03/21/22,soonest appointment available.

## 2022-02-06 NOTE — Therapy (Incomplete)
?OUTPATIENT PHYSICAL THERAPY TREATMENT NOTE ? ? ?Patient Name: Haley White ?MRN: [id number] ?DOB:23-Jan-1956, 66 y.o., female ?Today's Date: 02/06/2022 ? ? ? ? ? ? ? ? ?Past Medical History:  ?Diagnosis Date  ? Anxiety   ? Arthritis   ? Asthma   ? Cataract   ? Gout   ? Hyperlipidemia   ? Hypertension   ? Obese   ? ?Past Surgical History:  ?Procedure Laterality Date  ? APPENDECTOMY  1984  ? CATARACT EXTRACTION W/ INTRAOCULAR LENS  IMPLANT, BILATERAL  2014  ? [place]  ? X3  ? COLONOSCOPY  11/16/2013  ? w/Brodie   ? FOOT SURGERY Left 2002  ? KNEE SURGERY Left 1998  ? ROTATOR CUFF REPAIR Right 2007  ? TUBAL LIGATION  1994  ? ?Patient Active Problem List  ? Diagnosis Date Noted  ? Allergic rhinitis 03/15/2021  ? Allergic rhinitis due to animal (cat) (dog) hair and dander 03/15/2021  ? Allergic rhinitis due to pollen 03/15/2021  ? Gastro-esophageal reflux disease without esophagitis 03/15/2021  ? Mild persistent asthma, uncomplicated 75/08/2584  ? Aortic atherosclerosis ([place]) by Chest CT scan on 02/02/2018  01/10/2021  ? Asthma   ? Low back pain 03/17/2019  ? Lumbosacral spondylosis without myelopathy 03/17/2019  ? Cubital tunnel syndrome 10/14/2018  ? Closed fracture of distal end of right radius 07/09/2018  ? Pseudogout 12/09/2017  ? Major depressive disorder, recurrent, severe without psychotic features ([place])   ? 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 Suicide attempt by drug ingestion ([place])   ? Medication management 08/22/2014  ? Vitamin D deficiency 08/22/2014  ? Hyperlipidemia, mixed 08/22/2014  ? Hypertension   ? Anxiety   ? Abnormal glucose   ? Class 2 severe obesity due to excess calories with serious comorbidity and body mass index (BMI) of 37.0 to 37.9 in adult [facility])   ? ? ?PCP: Unk Pinto, MD ? ?REFERRING PROVIDER: Trula Slade, DPM ? ?REFERRING DIAG: I77.824M (ICD-10-CM) - Tendon tear, ankle, subsequent encounter ? ?THERAPY DIAG:  ?No diagnosis found. ? ? ? ?SUBJECTIVE:   ? ?SUBJECTIVE STATEMENT: ?My foot is 0/10 and my left knee is about a 0/10 without getting on my 3/10 on elliptical for 3 minutes.  I got my Silver Sneakers.  I had blood test and I had one blood test came back positive.  My doctor is referring me to a rheumatologist.  I have lost 20 lbs ?No complaints.  The hardest thing is going down steps.  ? ?PERTINENT HISTORY: ?two Lt ankle surgeries within 8 months; HTN; anxiety; global OA ? ?PAIN:  ?Are you having pain? Yes ?NPRS scale: 0/10 in left foot at rest.  My knee is fine as long as I am not on an elliptical ?Pain location: Lt lateral ankle, plantar foot  ?PAIN TYPE: tight, pulling  ?Pain description: intermittent  ?Aggravating factors: prolonged standing/walking  ?Relieving factors: unknown  ? ?PRECAUTIONS: None ? ?WEIGHT BEARING RESTRICTIONS No ? ? ? ?OBJECTIVE:  ? IMAGING:  ?06/06/2021: MRI L Ankle Without Contrast: IMPRESSION: 1. 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 Long segment longitudinal split tear of the peroneus brevis tendon. Peroneus longus tendon is laterally subluxed. 2. Chronic Achilles tendinopathy with small partial-thickness interstitial tears distally, similar to prior. 3. Moderate arthropathy within the midfoot and TMT joints, not significantly progressed from prior. ? ?PATIENT SURVEYS:  ?FOTO 52% ? ?PALPATION: ?Diffuse tenderness about Lt lateral ankle ? ?LE AROM/PROM: ? ?A/PROM Right ?02/06/2022 Left ?02/06/2022  ?  Hip flexion    ?Hip extension    ?Hip abduction    ?Hip adduction    ?Hip internal rotation    ?Hip external rotation    ?Knee flexion    ?Knee extension    ?Ankle dorsiflexion  WNL  ?Ankle plantarflexion  WNL  ?Ankle inversion  WNL  ?Ankle eversion  WNL mild pn   ? (Blank rows = not tested) ? ?LE MMT: ? ?MMT Right ?02/06/2022 Left ?02/06/2022  ?Hip flexion 5 5  ?Hip extension 4+ 4+  ?Hip abduction 4+ 4+  ?Hip adduction    ?Hip internal rotation    ?Hip external rotation    ?Knee flexion    ?Knee extension    ?Ankle  dorsiflexion  5  ?Ankle plantarflexion 25 SL calf raises  Unable to complete SL calf raise   ?Ankle inversion  4+/5 pn  ?Ankle eversion  4+/5 pn  ? (Blank rows = not tested) ? ?FUNCTIONAL TESTS:  ?6 MWT: 1130 ft; 4/10 pain Lt lateral ankle ?02/05/22  6 MWT: 1356  Pain 4/10 ft; pain Lt lateral ankle ?SLS: 5 seconds LLE; 30 seconds RLE; 01/21/22: 5 seconds LLE ? ?GAIT: ?Distance walked: 6 MWT ?Assistive device utilized: None ?Level of assistance: Complete Independence ?Comments: no push off LLE, maintains Lt foot ER  ? ? ? ?TODAY'S TREATMENT: ?[facility] Adult PT Treatment:                                                DATE: 02-07-22 ?Therapeutic Exercise: ?*** ?Manual Therapy: ?*** ?Neuromuscular re-ed: ?*** ?Therapeutic Activity: ?*** ?Modalities: ?*** ?Self Care: ?***  ? ?[facility] Adult PT Treatment:                                                DATE: 02-05-22 ? ?6 MWT: 1356  Pain 4/10 ft; pain Lt lateral ankle ? ?Therapeutic Exercise: ?Attempted Elliptical for 3 min but pain increased in lateral foot so DC's  ?GTB resisted DF, EV,IN and PF x 15 each then performed 6 MWT ?Sitting heel raises 15 x bil ?Slant board gastroc stretch 3 x 30 sec by treadmill UE rail ?Reformer footwork 2 red 1 blue: Heels, arch and toes in parallel and then turnout all with double leg. 2 red 1 Green heels with turnout x 15 ?Alternating  DF stretch on reformer 2 Red ? ?Neuromuscular re-ed: ?SLS on foam , painful, unable to do > 5 sec without UE working on bil SLS for 3 minutes  ?Narrow stance on Airex   ?Tandem on Airex with no UE 30 sec each side ?Tandem on Airex with head turns 45 sec  ? ?Modalities: ?Ice massage 8 min ? ? ?[facility] Adult PT Treatment:                                                DATE: 01/30/22 ?Therapeutic Exercise: ?NuStep L5 UE and LE x 5 min  ?Reformer footwork 2 red 1 blue: Heels, arch and toes in parallel and then turnout all with double leg. 2 red 1 Green heels with turnout x 15 ?Alternating  calf raise pain with full DF, modified  ROM ?2 red calf raise x 15 with min pain 2/10 ?Single leg press out 2 red 1 yellow  ?Windshield wiper for hip IR  ?SLS on foam , painful, unable to do > 5 sec without UE  ?Narrow stance on Airex   ?Tandem on Airex with no UE 30 sec each side ?Tandem on Airex with head turns 45 sec  ?Self Care: ?Tight hips (IR) vs ankle eversion ?  ?[facility] Adult PT Treatment:                                                DATE: 01-28-22 ?Aquatics ? ? ?Pt seen for aquatic therapy today.  Treatment took place in water 3.25-4.8 ft in depth at the [employer] pool. Temp of water was 92?. Pt enters with flip flops and no AD but antalgic gait.  Pt entered/exited the pool via stairs  step to pattern independently with bilat rail ? ?Walking forward, back and side stepping 4 widths each ?Tandem forward and back 2 widths ea, yellow hand buoys for balance ? ?NMR: SLS R/L bottom of pool R/L 2x30s ; blue square pool noodle rocker forward and backward 2x30s. ?-Tandem stance on blue noodle 2x30s Leading R/L ?-inversion 2x10 ? ?SLS R/L x30s hand buoys for ue support and balance ?Figure 4 stretch 2x20s R/L ?IT band stretch 2x20s R/L (Also completed in sitting) ? ?Seated flutter kicking (SLR with DF/PF) 4x20 ?Add/abd 3x20 reps (ankle in df) ? ?Toe walking and heel walking 3 widths. ?Df; PF 2x10 pool bottom ?-lle submerged in 4 ft Df/pf 2x10 ? ?Pt requires buoyancy for support and to offload joints with strengthening exercises. Viscosity of the water is needed for resistance of strengthening; water current perturbations provides challenge to standing balance unsupported, requiring increased core activation.  ? ? ?[facility] Adult PT Treatment:                                                DATE: 01-23-22 ?Aquatics ?Pt seen for aquatic therapy today.  Treatment took place in water 3.25-4.8 ft in depth at the [employer] pool. Temp of water was 92?. Pt enters with flip flops and no AD but antalgic gait.  Pt entered/exited the pool via stairs   step to pattern independently with bilat rail ?  ?Walking forward, back and side stepping 10 widths each utilizing aquatic barbells and  2lb water weights each ankle ?  ?Continuing eversion with stepping in

## 2022-02-06 NOTE — Telephone Encounter (Signed)
Called and first available is May 18th, has been scheduled.

## 2022-02-07 ENCOUNTER — Ambulatory Visit: Payer: Medicare HMO | Admitting: Physical Therapy

## 2022-02-07 ENCOUNTER — Encounter: Payer: Self-pay | Admitting: Physical Therapy

## 2022-02-07 DIAGNOSIS — R262 Difficulty in walking, not elsewhere classified: Secondary | ICD-10-CM | POA: Diagnosis not present

## 2022-02-07 DIAGNOSIS — M79672 Pain in left foot: Secondary | ICD-10-CM

## 2022-02-07 DIAGNOSIS — M6281 Muscle weakness (generalized): Secondary | ICD-10-CM | POA: Diagnosis not present

## 2022-02-07 DIAGNOSIS — Z9889 Other specified postprocedural states: Secondary | ICD-10-CM

## 2022-02-07 DIAGNOSIS — M25672 Stiffness of left ankle, not elsewhere classified: Secondary | ICD-10-CM

## 2022-02-07 DIAGNOSIS — S96919D Strain of unspecified muscle and tendon at ankle and foot level, unspecified foot, subsequent encounter: Secondary | ICD-10-CM | POA: Diagnosis not present

## 2022-02-07 NOTE — Therapy (Signed)
?OUTPATIENT PHYSICAL THERAPY TREATMENT NOTE/PROGRESS NOTE ? ? ?Patient Name: Haley White ?MRN: [id number] ?DOB:02/19/1956, 66 y.o., female ?Today's Date: 02/07/2022 ? ?Progress Note ?Reporting Period 01-09-22 to 02-07-22 ? ?See note below for Objective Data and Assessment of Progress/Goals.  ? ?  ? PT End of Session - 02/07/22 1154   ? ? Visit Number 30   ? Number of Visits 33   ? Date for PT Re-Evaluation 02/23/22   ? Authorization Type MCR   ? Progress Note Due on Visit 40   ? PT Start Time 1146   ? PT Stop Time 1230   ? PT Time Calculation (min) 44 min   ? Activity Tolerance Patient tolerated treatment well   ? Behavior During Therapy [facility] for tasks assessed/performed   ? ?  ?  ? ?  ? ? ? ? ? ? ?Past Medical History:  ?Diagnosis Date  ? Anxiety   ? Arthritis   ? Asthma   ? Cataract   ? Gout   ? Hyperlipidemia   ? Hypertension   ? Obese   ? ?Past Surgical History:  ?Procedure Laterality Date  ? APPENDECTOMY  1984  ? CATARACT EXTRACTION W/ INTRAOCULAR LENS  IMPLANT, BILATERAL  2014  ? [place]  ? X3  ? COLONOSCOPY  11/16/2013  ? w/Brodie   ? FOOT SURGERY Left 2002  ? KNEE SURGERY Left 1998  ? ROTATOR CUFF REPAIR Right 2007  ? TUBAL LIGATION  1994  ? ?Patient Active Problem List  ? Diagnosis Date Noted  ? Allergic rhinitis 03/15/2021  ? Allergic rhinitis due to animal (cat) (dog) hair and dander 03/15/2021  ? Allergic rhinitis due to pollen 03/15/2021  ? Gastro-esophageal reflux disease without esophagitis 03/15/2021  ? Mild persistent asthma, uncomplicated 21/09/7355  ? Aortic atherosclerosis ([place]) by Chest CT scan on 02/02/2018  01/10/2021  ? Asthma   ? Low back pain 03/17/2019  ? Lumbosacral spondylosis without myelopathy 03/17/2019  ? Cubital tunnel syndrome 10/14/2018  ? Closed fracture of distal end of right radius 07/09/2018  ? Pseudogout 12/09/2017  ? Major depressive disorder, recurrent, severe without psychotic features ([place])   ? 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 Suicide attempt  by drug ingestion ([place])   ? Medication management 08/22/2014  ? Vitamin D deficiency 08/22/2014  ? Hyperlipidemia, mixed 08/22/2014  ? Hypertension   ? Anxiety   ? Abnormal glucose   ? Class 2 severe obesity due to excess calories with serious comorbidity and body mass index (BMI) of 37.0 to 37.9 in adult [facility])   ? ? ?PCP: Unk Pinto, MD ? ?REFERRING PROVIDER: Trula Slade, DPM ? ?REFERRING DIAG: P01.410V (ICD-10-CM) - Tendon tear, ankle, subsequent encounter ? ?THERAPY DIAG:  ?Pain in left foot ? ?Muscle weakness (generalized) ? ?Difficulty in walking, not elsewhere classified ? ?Status post left foot surgery ? ?Stiffness of left ankle, not elsewhere classified ? ?Tendon tear, ankle, unspecified laterality, subsequent encounter ? ? ? ?SUBJECTIVE:  ? ?SUBJECTIVE STATEMENT: ?My foot is 3/10 and my left knee is about a 0/10 . I do feel the weather changing.  I have an appt with Dr Benjamine Mola with rheumatology.    I got my Silver Sneakers and I have been to the [employer]. I have lost 20lb. I have difficulty going down steps. And I have such a hard time trying to raise my left heel. It is SO SO painful  ? ? ?PERTINENT HISTORY: ?two Lt ankle surgeries within 8  months; HTN; anxiety; global OA ? ?PAIN:  ?Are you having pain? Yes ?NPRS scale: 0/10 in left foot at rest.  My knee is fine as long as I am not on an elliptical but 5/10 with heel raise on Left ?Pain location: Lt lateral ankle, plantar foot  ?PAIN TYPE: tight, pulling  ?Pain description: intermittent  ?Aggravating factors: prolonged standing/walking  ?Relieving factors: unknown  ? ?PRECAUTIONS: None ? ?WEIGHT BEARING RESTRICTIONS No ? ? ? ?OBJECTIVE:  ? IMAGING:  ?06/06/2021: MRI L Ankle Without Contrast: IMPRESSION: 1. 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 Long segment longitudinal split tear of the peroneus brevis tendon. Peroneus longus tendon is laterally subluxed. 2. 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 3. Moderate arthropathy within the midfoot and TMT joints, not significantly progressed from prior. ? ?PATIENT SURVEYS:  ?FOTO 52%  02-07-22 56% ? ?PALPATION: ?Diffuse tenderness about Lt lateral ankle ? ?LE AROM/PROM: ? ?A/PROM Right ?02/07/2022 Left ?02/07/2022  ?Hip flexion    ?Hip extension    ?Hip abduction    ?Hip adduction    ?Hip internal rotation    ?Hip external rotation    ?Knee flexion    ?Knee extension    ?Ankle dorsiflexion  WNL   ?Ankle plantarflexion  WNL with mild pain  ?Ankle inversion  WNL  ?Ankle eversion  WNL mild pn   ? (Blank rows = not tested) ? ?LE MMT: ? ?MMT Right ?02/07/2022 Left ?02/07/2022  ?Hip flexion 5 5  ?Hip extension 4+ 4+  ?Hip abduction 4+ 4+  ?Hip adduction    ?Hip internal rotation    ?Hip external rotation    ?Knee flexion    ?Knee extension    ?Ankle dorsiflexion  5  ?Ankle plantarflexion 30 SL calf raises  Unable SL calf raises difficulty with eccentric lowering even one time  ?Ankle inversion  4+/5 pn  ?Ankle eversion  4+/5 pn  ? (Blank rows = not tested) ? ?FUNCTIONAL TESTS:  ?6 MWT: 1130 ft; 4/10 pain Lt lateral ankle ?02/05/22  6 MWT: 1356  Pain 4/10 ft; pain Lt lateral ankle ?SLS: 5 seconds LLE; 30 seconds RLE; 01/21/22: 5 seconds LLE ? ?GAIT: ?Distance walked: 6 MWT ?Assistive device utilized: None ?Level of assistance: Complete Independence ?Comments: no push off LLE, maintains Lt foot ER  ? ? ? ?TODAY'S TREATMENT: ?[facility] Adult PT Treatment:                                                DATE: 02-07-22 ?Therapeutic Exercise: ?BTB resisted DF, EV,IN and PF x 15 each then performed 6 MWT ?Arch lift 2 x 10 ?Calf raise on foam attempted; pain  ?Calf stretch at wall x 60 seconds  ?Seated calf raise 2 x 15; 15 lb kettlebell  ?Step down on 4 inch step with increased L ankle pain ?Step down on 2 inch step with L remaining on step ?Step down on There ex pad ?30 x heel raise on R ?Attempted heel raise on L but unable due to pain ?Attempted  eccentric lowering with PT asssit x 3 but could not tolerate due to pain ? ? ?Neuromuscular re-ed: ?Multi angle step down from there ex pad ?SLS on foam , painful, unable to do > 5 sec without UE working on bil SLS for 32mnutes  ?Narrow stance  on Airex   ?Tandem on Airex with no UE 30 sec each side continuing for 3 min ?Tandem on Airex with head turns 30 sec  continuing for 3 min ? ? ?[facility] Adult PT Treatment:                                                DATE: 02-05-22 ? ?6 MWT: 1356  Pain 4/10 ft; pain Lt lateral ankle ? ?Therapeutic Exercise: ?Attempted Elliptical for 3 min but pain increased in lateral foot so DC's  ?GTB resisted DF, EV,IN and PF x 15 each then performed 6 MWT ?Sitting heel raises 15 x bil ?Slant board gastroc stretch 3 x 30 sec by treadmill UE rail ?Reformer footwork 2 red 1 blue: Heels, arch and toes in parallel and then turnout all with double leg. 2 red 1 Green heels with turnout x 15 ?Alternating  DF stretch on reformer 2 Red ? ?Neuromuscular re-ed: ?SLS on foam , painful, unable to do > 5 sec without UE working on bil SLS for 3 minutes  ?Narrow stance on Airex   ?Tandem on Airex with no UE 30 sec each side ?Tandem on Airex with head turns 45 sec  ? ?Modalities: ?Ice massage 8 min ? ? ?[facility] Adult PT Treatment:                                                DATE: 01/30/22 ?Therapeutic Exercise: ?NuStep L5 UE and LE x 5 min  ?Reformer footwork 2 red 1 blue: Heels, arch and toes in parallel and then turnout all with double leg. 2 red 1 Green heels with turnout x 15 ?Alternating calf raise pain with full DF, modified ROM ?2 red calf raise x 15 with min pain 2/10 ?Single leg press out 2 red 1 yellow  ?Windshield wiper for hip IR  ?SLS on foam , painful, unable to do > 5 sec without UE  ?Narrow stance on Airex   ?Tandem on Airex with no UE 30 sec each side ?Tandem on Airex with head turns 45 sec  ?Self Care: ?Tight hips (IR) vs ankle eversion ? ? ? ? ? ? ? ? ? ?PATIENT EDUCATION:  ?Education  details: see treatment above ?Person educated: Patient ?Education method: Explanation, demo, handout  ?Education comprehension: verbalized understanding, returned demo  ? ? ?HOME EXERCISE PROGRAM: ?Access Code [email or web address]

## 2022-02-11 NOTE — Therapy (Signed)
?OUTPATIENT PHYSICAL THERAPY TREATMENT NOTE/PROGRESS NOTE ? ? ?Patient Name: Haley White ?MRN: [id number] ?DOB:Aug 07, 1956, 66 y.o., female ?Today's Date: 02/12/2022 ? ?Progress Note ?Reporting Period 01-09-22 to 02-07-22 ? ?See note below for Objective Data and Assessment of Progress/Goals.  ? ?  ? PT End of Session - 02/12/22 0802   ? ? Visit Number 31   ? Number of Visits 33   ? Date for PT Re-Evaluation 02/23/22   ? Authorization Type MCR   ? Authorization - Visit Number 40   ? PT Start Time 0802   ? PT Stop Time 0845   ? PT Time Calculation (min) 43 min   ? Activity Tolerance Patient tolerated treatment well   ? Behavior During Therapy [facility] for tasks assessed/performed   ? ?  ?  ? ?  ? ? ? ? ? ? ? ?Past Medical History:  ?Diagnosis Date  ? Anxiety   ? Arthritis   ? Asthma   ? Cataract   ? Gout   ? Hyperlipidemia   ? Hypertension   ? Obese   ? ?Past Surgical History:  ?Procedure Laterality Date  ? APPENDECTOMY  1984  ? CATARACT EXTRACTION W/ INTRAOCULAR LENS  IMPLANT, BILATERAL  2014  ? [place]  ? X3  ? COLONOSCOPY  11/16/2013  ? w/[facility]rodie   ? FOOT SURGERY Left 2002  ? KNEE SURGERY Left 1998  ? ROTATOR CUFF REPAIR Right 2007  ? TUBAL LIGATION  1994  ? ?Patient Active Problem List  ? Diagnosis Date Noted  ? Allergic rhinitis 03/15/2021  ? Allergic rhinitis due to animal (cat) (dog) hair and dander 03/15/2021  ? Allergic rhinitis due to pollen 03/15/2021  ? Gastro-esophageal reflux disease without esophagitis 03/15/2021  ? Mild persistent asthma, uncomplicated 41/74/0814  ? Aortic atherosclerosis ([place]) by Chest CT scan on 02/02/2018  01/10/2021  ? Asthma   ? Low back pain 03/17/2019  ? Lumbosacral spondylosis without myelopathy 03/17/2019  ? Cubital tunnel syndrome 10/14/2018  ? Closed fracture of distal end of right radius 07/09/2018  ? Pseudogout 12/09/2017  ? Major depressive disorder, recurrent, severe without psychotic features ([place])   ? 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 Suicide  attempt by drug ingestion ([place])   ? Medication management 08/22/2014  ? Vitamin D deficiency 08/22/2014  ? Hyperlipidemia, mixed 08/22/2014  ? Hypertension   ? Anxiety   ? Abnormal glucose   ? Class 2 severe obesity due to excess calories with serious comorbidity and body mass index (BMI) of 37.0 to 37.9 in adult [facility])   ? ? ?PCP: Unk Pinto, MD ? ?REFERRING PROVIDER: Trula Slade, DPM ? ?REFERRING DIAG: G81.856D (ICD-10-CM) - Tendon tear, ankle, subsequent encounter ? ?THERAPY DIAG:  ?Pain in left foot ? ?Stiffness of left ankle, not elsewhere classified ? ?Muscle weakness (generalized) ? ?Tendon tear, ankle, unspecified laterality, subsequent encounter ? ?Difficulty in walking, not elsewhere classified ? ?Status post left foot surgery ? ? ? ?SUBJECTIVE:  ? ?SUBJECTIVE STATEMENT: Today I am a 0- 1/10 depending on  the position of my foot. I am looking forward to seeing Dr Benjamine Mola , my new MD a rheumatologist.  I have pain in my left knee 3/10 ? ? ? ?PERTINENT HISTORY: ?two Lt ankle surgeries within 8 months; HTN; anxiety; global OA ? ?PAIN:  ?Are you having pain? Yes ?NPRS scale: 0/10 in left foot at rest.  1/10 with my foot in certain positions ( with Internal rotation) 3/10 on Left  knee ?Pain location: Lt lateral ankle, plantar foot  ?PAIN TYPE: tight, pulling  ?Pain description: intermittent  ?Aggravating factors: prolonged standing/walking  ?Relieving factors: unknown  ? ?PRECAUTIONS: None ? ?WEIGHT BEARING RESTRICTIONS No ? ? ? ?OBJECTIVE:  ? IMAGING:  ?06/06/2021: MRI L Ankle Without Contrast: IMPRESSION: 1. 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 Long segment longitudinal split tear of the peroneus brevis tendon. Peroneus longus tendon is laterally subluxed. 2. Chronic Achilles tendinopathy with small partial-thickness interstitial tears distally, similar to prior. 3. Moderate arthropathy within the midfoot and TMT joints, not significantly progressed from prior. ? ?PATIENT  SURVEYS:  ?FOTO 52%  02-07-22 56% ? ?PALPATION: ?Diffuse tenderness about Lt lateral ankle ? ?LE AROM/PROM: ? ?A/PROM Right ?02/12/2022 Left ?02/12/2022  ?Hip flexion    ?Hip extension    ?Hip abduction    ?Hip adduction    ?Hip internal rotation    ?Hip external rotation    ?Knee flexion    ?Knee extension    ?Ankle dorsiflexion  WNL   ?Ankle plantarflexion  WNL with mild pain  ?Ankle inversion  WNL  ?Ankle eversion  WNL mild pn   ? (Blank rows = not tested) ? ?LE MMT: ? ?MMT Right ?02/12/2022 Left ?02/12/2022  ?Hip flexion 5 5  ?Hip extension 4+ 4+  ?Hip abduction 4+ 4+  ?Hip adduction    ?Hip internal rotation    ?Hip external rotation    ?Knee flexion    ?Knee extension    ?Ankle dorsiflexion  5  ?Ankle plantarflexion 30 SL calf raises  Unable SL calf raises difficulty with eccentric lowering even one time  ?Ankle inversion  4+/5 pn  ?Ankle eversion  4+/5 pn  ? (Blank rows = not tested) ? ?FUNCTIONAL TESTS:  ?6 MWT: 1130 ft; 4/10 pain Lt lateral ankle ?02/05/22  6 MWT: 1356  Pain 4/10 ft; pain Lt lateral ankle ?SLS: 5 seconds LLE; 30 seconds RLE; 01/21/22: 5 seconds LLE ? ?GAIT: ?Distance walked: 6 MWT ?Assistive device utilized: None ?Level of assistance: Complete Independence ?Comments: no push off LLE, maintains Lt foot ER  ? ? ? ?TODAY'S TREATMENT: ? ?[place] Adult PT Treatment:                                                DATE: 02-12-22 ?Therapeutic Exercise: ?NuStep L4 UE and LE x 6 min [place]ata on 20 sec off 10, RPE 5/6 ?Leg press Omega 25 # bil 25 x ?Leg press Omega L only 25 # 2 x 10 ?Heel raise  bil on Omega 20 x 25 # ?Heel raise Omega Left only 15 # 2 x 10 VC for bearing wt through great toe ?Heel raise at counter 20 x, Left,  attempted concentric heel raise, too painful only able to do 2 x ?Heel eccentric lowering with bil heel raise with 3 count tempo  x 8 to fatigue ? ?Neuromuscular re-ed: ?Multi angle step down from there ex pad ?SLS on foam , painful, unable to do > 5 sec without UE working on bil SLS for  9mnutes  ?Narrow stance on Airex   ?Tandem on Airex with no UE 30 sec each side continuing for 3 min ?Tandem on Airex with head turns 30 sec  continuing for 3 min ? [facility]  [facility] ?Therapeutic Exercise: ?BTB resisted DF, EV,IN and PF x 15 each then performed 6 MWT ?Arch lift 2 x 10 ?Calf raise on foam attempted; pain  ?Calf stretch at wall x 60 seconds  ?Seated calf raise 2 x 15; 15 lb kettlebell  ?Step down on 4 inch step with increased L ankle pain ?Step down on 2 inch step with L remaining on step ?Step down on There ex pad ?30 x heel raise on R ?Attempted heel raise on L but unable due to pain ?Attempted eccentric lowering with PT asssit x 3 but could not tolerate due to pain ?Trap bar (45#) lift form 14 in boxes x 10  pain in Left knee ?Spanish squat with 10 # KB with purple power band for Spanish squat 2 x 10 ?Hip hinge to chair x 10 with VC ? ? ?Neuromuscular re-ed: ?Multi angle step down from 2 inch step ? In corner SLS on foam , painful, unable to do > 15 sec without UE working on unilateral SLS for 47mnutes  ?Narrow stance on Airex   ?Tandem on Airex with no UE  up to 30 sec each side continuing for 3 min ?Tandem on level ground  with  10# KB  around the world up to  5 to 15 sec  continuing for 3 min ? ? ?[facility]                                                [facility]: 02-05-22 ? ?6 MWT: 1356  Pain 4/10 ft; pain Lt lateral ankle ? ?Therapeutic Exercise: ?Attempted Elliptical for 3 min but pain increased in lateral foot so DC's  ?GTB resisted DF, EV,IN and PF x 15 each then performed 6 MWT ?Sitting heel raises 15 x bil ?Slant board gastroc stretch 3 x 30 sec by treadmill UE rail ?Reformer footwork 2 red 1 blue: Heels, arch and toes in parallel and then turnout all with double leg. 2 red 1 Green heels with turnout x 15 ?Alternating  DF stretch on reformer 2 Red ? ?Neuromuscular re-ed: ?SLS on foam , painful, unable to do > 5  sec without UE working on bil SLS for 3 minutes  ?Narrow stance on Airex   ?Tandem on Airex with no UE 30 sec each side ?Tandem on Airex with head turns 45 sec  ? ?Modalities: ?Ice massage 8 min ? ? ?[facility]

## 2022-02-12 ENCOUNTER — Encounter: Payer: Self-pay | Admitting: Physical Therapy

## 2022-02-12 ENCOUNTER — Ambulatory Visit: Payer: Medicare HMO | Admitting: Physical Therapy

## 2022-02-12 DIAGNOSIS — Z9889 Other specified postprocedural states: Secondary | ICD-10-CM | POA: Diagnosis not present

## 2022-02-12 DIAGNOSIS — S96919D Strain of unspecified muscle and tendon at ankle and foot level, unspecified foot, subsequent encounter: Secondary | ICD-10-CM | POA: Diagnosis not present

## 2022-02-12 DIAGNOSIS — M25672 Stiffness of left ankle, not elsewhere classified: Secondary | ICD-10-CM

## 2022-02-12 DIAGNOSIS — R262 Difficulty in walking, not elsewhere classified: Secondary | ICD-10-CM

## 2022-02-12 DIAGNOSIS — M6281 Muscle weakness (generalized): Secondary | ICD-10-CM

## 2022-02-12 DIAGNOSIS — M79672 Pain in left foot: Secondary | ICD-10-CM | POA: Diagnosis not present

## 2022-02-12 NOTE — Patient Instructions (Signed)
?  Voncille Lo, PT, ATRIC ?Certified Exercise Expert for the Aging Adult  ?02/12/22 8:46 AM ?Phone: [phone] ?Fax: [phone]  ?

## 2022-02-13 NOTE — Therapy (Signed)
?OUTPATIENT PHYSICAL THERAPY TREATMENT NOTE ? ? ?Patient Name: Haley White ?MRN: [id number] ?DOB:1956-01-05, 66 y.o., female ?Today's Date: 02/14/2022 ? ? ? ?  ? PT End of Session - 02/14/22 1040   ? ? Visit Number 32   ? Number of Visits 40   ? Date for PT Re-Evaluation 02/23/22   ? Authorization Type MCR   ? Authorization - Visit Number 32   ? Authorization - Number of Visits 40   ? Progress Note Due on Visit 40   ? PT Start Time 1016   ? PT Stop Time 1100   ? PT Time Calculation (min) 44 min   ? Activity Tolerance Patient tolerated treatment well   ? Behavior During Therapy [facility] for tasks assessed/performed   ? ?  ?  ? ?  ? ? ? ? ? ? ? ? ?Past Medical History:  ?Diagnosis Date  ? Anxiety   ? Arthritis   ? Asthma   ? Cataract   ? Gout   ? Hyperlipidemia   ? Hypertension   ? Obese   ? ?Past Surgical History:  ?Procedure Laterality Date  ? APPENDECTOMY  1984  ? CATARACT EXTRACTION W/ INTRAOCULAR LENS  IMPLANT, BILATERAL  2014  ? [place]  ? X3  ? COLONOSCOPY  11/16/2013  ? w/Brodie   ? FOOT SURGERY Left 2002  ? KNEE SURGERY Left 1998  ? ROTATOR CUFF REPAIR Right 2007  ? TUBAL LIGATION  1994  ? ?Patient Active Problem List  ? Diagnosis Date Noted  ? Allergic rhinitis 03/15/2021  ? Allergic rhinitis due to animal (cat) (dog) hair and dander 03/15/2021  ? Allergic rhinitis due to pollen 03/15/2021  ? Gastro-esophageal reflux disease without esophagitis 03/15/2021  ? Mild persistent asthma, uncomplicated 88/41/6606  ? Aortic atherosclerosis ([place]) by Chest CT scan on 02/02/2018  01/10/2021  ? Asthma   ? Low back pain 03/17/2019  ? Lumbosacral spondylosis without myelopathy 03/17/2019  ? Cubital tunnel syndrome 10/14/2018  ? Closed fracture of distal end of right radius 07/09/2018  ? Pseudogout 12/09/2017  ? Major depressive disorder, recurrent, severe without psychotic features ([place])   ? 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 Suicide attempt by drug ingestion ([place])   ? Medication management  08/22/2014  ? Vitamin D deficiency 08/22/2014  ? Hyperlipidemia, mixed 08/22/2014  ? Hypertension   ? Anxiety   ? Abnormal glucose   ? Class 2 severe obesity due to excess calories with serious comorbidity and body mass index (BMI) of 37.0 to 37.9 in adult [facility])   ? ? ?PCP: Unk Pinto, MD ? ?REFERRING PROVIDER: Trula Slade, DPM ? ?REFERRING DIAG: T01.601U (ICD-10-CM) - Tendon tear, ankle, subsequent encounter ? ?THERAPY DIAG:  ?Pain in left foot ? ?Stiffness of left ankle, not elsewhere classified ? ?Muscle weakness (generalized) ? ?Tendon tear, ankle, unspecified laterality, subsequent encounter ? ?Difficulty in walking, not elsewhere classified ? ?Status post left foot surgery ? ? ? ?SUBJECTIVE:  ? ?SUBJECTIVE STATEMENT:  I am not doing too bad but I feel rain coming.  I am a 1-2/10.  Left knee 3/10 ? ? ? ? ?PERTINENT HISTORY: ?two Lt ankle surgeries within 8 months; HTN; anxiety; global OA ? ?PAIN: 3/10 Left knee ?Are you having pain? Yes ?NPRS scale: 0/10 in left foot at rest.  1-2/10 with my foot in certain positions ( with Internal rotation)  ?Pain location: Lt lateral ankle, plantar foot  ?PAIN TYPE: tight, pulling  ?Pain description:  intermittent  ?Aggravating factors: prolonged standing/walking  ?Relieving factors: unknown  ? ?PRECAUTIONS: None ? ?WEIGHT BEARING RESTRICTIONS No ? ? ? ?OBJECTIVE:  ? IMAGING:  ?06/06/2021: MRI L Ankle Without Contrast: IMPRESSION: 1. 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 Long segment longitudinal split tear of the peroneus brevis tendon. Peroneus longus tendon is laterally subluxed. 2. Chronic Achilles tendinopathy with small partial-thickness interstitial tears distally, similar to prior. 3. Moderate arthropathy within the midfoot and TMT joints, not significantly progressed from prior. ? ?PATIENT SURVEYS:  ?FOTO 52%  02-07-22 56% ? ?PALPATION: ?Diffuse tenderness about Lt lateral ankle ? ?LE AROM/PROM: ? ?A/PROM Right ?02/14/2022  Left ?02/14/2022  ?Hip flexion    ?Hip extension    ?Hip abduction    ?Hip adduction    ?Hip internal rotation    ?Hip external rotation    ?Knee flexion    ?Knee extension    ?Ankle dorsiflexion  WNL   ?Ankle plantarflexion  WNL with mild pain  ?Ankle inversion  WNL  ?Ankle eversion  WNL mild pn   ? (Blank rows = not tested) ? ?LE MMT: ? ?MMT Right ?02/14/2022 Left ?02/14/2022  ?Hip flexion 5 5  ?Hip extension 4+ 4+  ?Hip abduction 4+ 4+  ?Hip adduction    ?Hip internal rotation    ?Hip external rotation    ?Knee flexion    ?Knee extension    ?Ankle dorsiflexion  5  ?Ankle plantarflexion 30 SL calf raises  Unable SL calf raises difficulty with eccentric lowering even one time  ?Ankle inversion  4+/5 pn  ?Ankle eversion  4+/5 pn  ? (Blank rows = not tested) ? ?FUNCTIONAL TESTS:  ?6 MWT: 1130 ft; 4/10 pain Lt lateral ankle ?02/05/22  6 MWT: 1356  Pain 4/10 ft; pain Lt lateral ankle ?SLS: 5 seconds LLE; 30 seconds RLE; 01/21/22: 5 seconds LLE ? ?GAIT: ?Distance walked: 6 MWT ?Assistive device utilized: None ?Level of assistance: Complete Independence ?Comments: no push off LLE, maintains Lt foot ER  ? ? ? ?TODAY'S TREATMENT: ?[facility] Adult PT Treatment:                                                DATE: 02-14-22 ?Therapeutic Exercise: ?Heel raise bil x 15 ?Hip ext bil 2 x 10 with RTB resistance ?Hip abd bil 2 x 10 with RTB resistance ?Standing hip flexor marching 20 x with RTB ?Monster walk 6 x 50 feet ?Mini lunge using walking stick with r foot leading ( left foot with some pain) and then Left foot leading (no pain in left foot)  10 x each ? ? ?Neuromuscular re-ed: ?Multi angle step down from 2 inch step with R foot  and then from Left foot x 15 each  ?Multi angle step down from 2 inch step with There ex pad with R foot  and then from Left foot x 15 each  ?SLS on foam , painful, unable to do > 5 sec without UE working on bil SLS for 84mnutes  ?Narrow stance on Airex   ?Tandem on Airex with no  up to UE 30 sec each side  continuing for 3 min ?Tandem on Airex with head turns  up to 30 sec  continuing for 3 min ? ?Self Care: ?[employer], basics of fitness and injury prevention ? ?  [facility] Adult PT Treatment:                                                DATE: 02-12-22 ?Therapeutic Exercise: ?NuStep L4 UE and LE x 6 min [place] on 20 sec off 10, RPE 5/6 ?Leg press Omega 25 # bil 25 x ?Leg press Omega L only 25 # 2 x 10 ?Heel raise  bil on Omega 20 x 25 # ?Heel raise Omega Left only 15 # 2 x 10 VC for bearing wt through great toe ?Heel raise at counter 20 x, Left,  attempted concentric heel raise, too painful only able to do 2 x ?Heel eccentric lowering with bil heel raise with 3 count tempo  x 8 to fatigue ? ?Neuromuscular re-ed: ?Multi angle step down from there ex pad ?SLS on foam , painful, unable to do > 5 sec without UE working on bil SLS for 31mnutes  ?Narrow stance on Airex   ?Tandem on Airex with no UE 30 sec each side continuing for 3 min ?Tandem on Airex with head turns 30 sec  continuing for 3 min ? [facility]                                                [facility] ?Therapeutic Exercise: ?BTB resisted DF, EV,IN and PF x 15 each then performed 6 MWT ?Arch lift 2 x 10 ?Calf raise on foam attempted; pain  ?Calf stretch at wall x 60 seconds  ?Seated calf raise 2 x 15; 15 lb kettlebell  ?Step down on 4 inch step with increased L ankle pain ?Step down on 2 inch step with L remaining on step ?Step down on There ex pad ?30 x heel raise on R ?Attempted heel raise on L but unable due to pain ?Attempted eccentric lowering with PT asssit x 3 but could not tolerate due to pain ?Trap bar (45#) lift form 14 in boxes x 10  pain in Left knee ?Spanish squat with 10 # KB with purple power band for Spanish squat 2 x 10 ?Hip hinge to chair x 10 with VC ? ? ?Neuromuscular re-ed: ?Multi angle step down from 2 inch step ? In corner SLS on foam , painful, unable to do > 15 sec  without UE working on unilateral SLS for 552mutes  ?Narrow stance on Airex   ?Tandem on Airex with no UE  up to 30 sec each side continuing for 3 min ?Tandem on level ground  with  10# KB  around the world up to  5 t

## 2022-02-14 ENCOUNTER — Encounter: Payer: Self-pay | Admitting: Physical Therapy

## 2022-02-14 ENCOUNTER — Ambulatory Visit: Payer: Medicare HMO | Admitting: Physical Therapy

## 2022-02-14 DIAGNOSIS — M25672 Stiffness of left ankle, not elsewhere classified: Secondary | ICD-10-CM

## 2022-02-14 DIAGNOSIS — R262 Difficulty in walking, not elsewhere classified: Secondary | ICD-10-CM

## 2022-02-14 DIAGNOSIS — M79672 Pain in left foot: Secondary | ICD-10-CM | POA: Diagnosis not present

## 2022-02-14 DIAGNOSIS — S96919D Strain of unspecified muscle and tendon at ankle and foot level, unspecified foot, subsequent encounter: Secondary | ICD-10-CM

## 2022-02-14 DIAGNOSIS — M6281 Muscle weakness (generalized): Secondary | ICD-10-CM | POA: Diagnosis not present

## 2022-02-14 DIAGNOSIS — Z9889 Other specified postprocedural states: Secondary | ICD-10-CM | POA: Diagnosis not present

## 2022-02-15 DIAGNOSIS — J301 Allergic rhinitis due to pollen: Secondary | ICD-10-CM | POA: Diagnosis not present

## 2022-02-15 DIAGNOSIS — J3089 Other allergic rhinitis: Secondary | ICD-10-CM | POA: Diagnosis not present

## 2022-02-15 DIAGNOSIS — J3081 Allergic rhinitis due to animal (cat) (dog) hair and dander: Secondary | ICD-10-CM | POA: Diagnosis not present

## 2022-02-17 DIAGNOSIS — R69 Illness, unspecified: Secondary | ICD-10-CM | POA: Diagnosis not present

## 2022-02-17 DIAGNOSIS — J301 Allergic rhinitis due to pollen: Secondary | ICD-10-CM | POA: Diagnosis not present

## 2022-02-17 DIAGNOSIS — F411 Generalized anxiety disorder: Secondary | ICD-10-CM | POA: Diagnosis not present

## 2022-02-17 DIAGNOSIS — Z62820 Parent-biological child conflict: Secondary | ICD-10-CM | POA: Diagnosis not present

## 2022-02-17 DIAGNOSIS — F331 Major depressive disorder, recurrent, moderate: Secondary | ICD-10-CM | POA: Diagnosis not present

## 2022-02-18 DIAGNOSIS — M25562 Pain in left knee: Secondary | ICD-10-CM | POA: Diagnosis not present

## 2022-02-18 NOTE — Therapy (Signed)
?OUTPATIENT PHYSICAL THERAPY TREATMENT NOTE ? ? ?Patient Name: Haley White ?MRN: [id number] ?DOB:[birth year], 66 y.o., female ?Today's Date: 02/19/2022 ? ? ? ?  ? PT End of Session - 02/19/22 1102   ? ? Visit Number 33   ? Number of Visits 40   ? Date for PT Re-Evaluation 02/23/22   ? Authorization Type MCR   ? Authorization - Visit Number 33   ? Authorization - Number of Visits 40   ? PT Start Time 1100   ? PT Stop Time 1140   ? PT Time Calculation (min) 40 min   ? Activity Tolerance Patient tolerated treatment well   ? Behavior During Therapy [facility] for tasks assessed/performed   ? ?  ?  ? ?  ? ? ? ? ? ? ? ? ? ?Past Medical History:  ?Diagnosis Date  ? Anxiety   ? Arthritis   ? Asthma   ? Cataract   ? Gout   ? Hyperlipidemia   ? Hypertension   ? Obese   ? ?Past Surgical History:  ?Procedure Laterality Date  ? APPENDECTOMY  1984  ? CATARACT EXTRACTION W/ INTRAOCULAR LENS  IMPLANT, BILATERAL  2014  ? [place]  ? X3  ? COLONOSCOPY  11/16/2013  ? w/Brodie   ? FOOT SURGERY Left 2002  ? KNEE SURGERY Left 1998  ? ROTATOR CUFF REPAIR Right 2007  ? TUBAL LIGATION  1994  ? ?Patient Active Problem List  ? Diagnosis Date Noted  ? Allergic rhinitis 03/15/2021  ? Allergic rhinitis due to animal (cat) (dog) hair and dander 03/15/2021  ? Allergic rhinitis due to pollen 03/15/2021  ? Gastro-esophageal reflux disease without esophagitis 03/15/2021  ? Mild persistent asthma, uncomplicated 56/81/2751  ? Aortic atherosclerosis ([place]) by Chest CT scan on 02/02/2018  01/10/2021  ? Asthma   ? Low back pain 03/17/2019  ? Lumbosacral spondylosis without myelopathy 03/17/2019  ? Cubital tunnel syndrome 10/14/2018  ? Closed fracture of distal end of right radius 07/09/2018  ? Pseudogout 12/09/2017  ? Major depressive disorder, recurrent, severe without psychotic features ([place])   ? 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 Suicide attempt by drug ingestion ([place])   ? Medication management 08/22/2014  ? Vitamin D deficiency  08/22/2014  ? Hyperlipidemia, mixed 08/22/2014  ? Hypertension   ? Anxiety   ? Abnormal glucose   ? Class 2 severe obesity due to excess calories with serious comorbidity and body mass index (BMI) of 37.0 to 37.9 in adult [facility])   ? ? ?PCP: Unk Pinto, MD ? ?REFERRING PROVIDER: Trula Slade, DPM ? ?REFERRING DIAG: Z00.174B (ICD-10-CM) - Tendon tear, ankle, subsequent encounter ? ?THERAPY DIAG:  ?Pain in left foot ? ?Stiffness of left ankle, not elsewhere classified ? ?Muscle weakness (generalized) ? ?Tendon tear, ankle, unspecified laterality, subsequent encounter ? ?Difficulty in walking, not elsewhere classified ? ?Status post left foot surgery ? ? ? ?SUBJECTIVE:  ? ?SUBJECTIVE STATEMENT:  the top of my foot (lateral last 3 metatarsals/foot phalanges)  I am a 4/10. I have been doing more feet intentionally.  It feels like a pair of pliers and someone is using it on my foot. ? ? ? ? ?PERTINENT HISTORY: ?two Lt ankle surgeries within 8 months; HTN; anxiety; global OA ? ?PAIN: 3/10 Left knee ?Are you having pain? Yes ?NPRS scale: 4/10 in left foot at rest.   ?Pain location: Lt lateral ankle, plantar foot  ?PAIN TYPE: tight, pulling  ?Pain description: intermittent  ?  Aggravating factors: prolonged standing/walking  ?Relieving factors: unknown  ? ?PRECAUTIONS: None ? ?WEIGHT BEARING RESTRICTIONS No ? ? ? ?OBJECTIVE:  ? IMAGING:  ?06/06/2021: MRI L Ankle Without Contrast: IMPRESSION: 1. 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 Long segment longitudinal split tear of the peroneus brevis tendon. Peroneus longus tendon is laterally subluxed. 2. Chronic Achilles tendinopathy with small partial-thickness interstitial tears distally, similar to prior. 3. Moderate arthropathy within the midfoot and TMT joints, not significantly progressed from prior. ? ?PATIENT SURVEYS:  ?FOTO 52%  02-07-22 56% ? ?PALPATION: ?Diffuse tenderness about Lt lateral ankle ? ?LE AROM/PROM: ? ?A/PROM Right ?02/19/2022  Left ?02/19/2022  ?Hip flexion    ?Hip extension    ?Hip abduction    ?Hip adduction    ?Hip internal rotation    ?Hip external rotation    ?Knee flexion    ?Knee extension    ?Ankle dorsiflexion  WNL   ?Ankle plantarflexion  WNL with mild pain  ?Ankle inversion  WNL  ?Ankle eversion  WNL mild pn   ? (Blank rows = not tested) ? ?LE MMT: ? ?MMT Right ?02/19/2022 Left ?02/19/2022  ?Hip flexion 5 5  ?Hip extension 4+ 4+  ?Hip abduction 4+ 4+  ?Hip adduction    ?Hip internal rotation    ?Hip external rotation    ?Knee flexion    ?Knee extension    ?Ankle dorsiflexion  5  ?Ankle plantarflexion 30 SL calf raises  Unable SL calf raises difficulty with eccentric lowering even one time  ?Ankle inversion  4+/5 pn  ?Ankle eversion  4+/5 pn  ? (Blank rows = not tested) ? ?FUNCTIONAL TESTS:  ?6 MWT: 1130 ft; 4/10 pain Lt lateral ankle ?02/05/22  6 MWT: 1356  Pain 4/10 ft; pain Lt lateral ankle ?SLS: 5 seconds LLE; 30 seconds RLE; 01/21/22: 5 seconds LLE ? ?GAIT: ?Distance walked: 6 MWT ?Assistive device utilized: None ?Level of assistance: Complete Independence ?Comments: no push off LLE, maintains Lt foot ER  ? ? ? ?TODAY'S TREATMENT: ?[facility] Adult PT Treatment:                                                DATE: 02-19-22 ?Therapeutic Exercise: ?Short foot with L foot with Green TB under ball of left great toe, working dropping 1st  ?Toe yoga ?Tennis ball between bil heels and heel raise x 10 . Pain in Left lateral 3,4 and 5 MT of left foot ?Hip abduction 2 x 10 on L and R ?Bridging 3  x 10 ?GTB resistance for plantarflexion 3 x 10 with decreased pain ?Sit to stand 1 x 10 with pain in left foot ? ?Manual Therapy: ?KT tape for support of arch and pain over top of foot at 100% stretch ?STW and mobs for metatarsals and mx on left foot ?GAIT-  Pt progressed up and down steps with alternating stepping ascending but only can descend one step at a time without pain in foot especially over L 4th and 5th metatarsal.  ? ?[facility] Adult PT Treatment:                                                 DATE: 02-14-22 ?Therapeutic Exercise: ?Heel  raise bil x 15 ?Hip ext bil 2 x 10 with RTB resistance ?Hip abd bil 2 x 10 with RTB resistance ?Standing hip flexor marching 20 x with RTB ?Monster walk 6 x 50 feet ?Mini lunge using walking stick with r foot leading ( left foot with some pain) and then Left foot leading (no pain in left foot)  10 x each ? ? ?Neuromuscular re-ed: ?Multi angle step down from 2 inch step with R foot  and then from Left foot x 15 each  ?Multi angle step down from 2 inch step with There ex pad with R foot  and then from Left foot x 15 each  ?SLS on foam , painful, unable to do > 5 sec without UE working on bil SLS for 23mnutes  ?Narrow stance on Airex   ?Tandem on Airex with no  up to UE 30 sec each side continuing for 3 min ?Tandem on Airex with head turns  up to 30 sec  continuing for 3 min ? ?Self Care: ?[employer] and [employer] community personal trainers, basics of fitness and injury prevention ? ?[facility]                                                [facility]: 02[facility] ?Therapeutic Exercise: ?NuStep L4 UE and LE x 6 min Tabata on 20 sec off 10, RPE 5/6 ?Leg press Omega 25 # bil 25 x ?Leg press Omega L only 25 # 2 x 10 ?Heel raise  bil on Omega 20 x 25 # ?Heel raise Omega Left only 15 # 2 x 10 VC for bearing wt through great toe ?Heel raise at counter 20 x, Left,  attempted concentric heel raise, too painful only able to do 2 x ?Heel eccentric lowering with bil heel raise with 3 count tempo  x 8 to fatigue ? ?Neuromuscular re-ed: ?Multi angle step down from there ex pad ?SLS on foam , painful, unable to do > 5 sec without UE working on bil SLS for 576mutes  ?Narrow stance on Airex   ?Tandem on Airex with no UE 30 sec each side continuing for 3 min ?Tandem on Airex with head turns 30 sec  continuing for 3 min ? ?[facility]                                                DATE: 02[facility]07[facility]22 ?Therapeutic  Exercise: ?BTB resisted DF, EV,IN and PF x 15 each then performed 6 MWT ?Arch lift 2 x 10 ?Calf raise on foam attempted; pain  ?Calf stretch at wall x 60 seconds  ?Seated calf raise 2 x 15; 15 lb kettlebell

## 2022-02-19 ENCOUNTER — Ambulatory Visit: Payer: Medicare HMO | Admitting: Physical Therapy

## 2022-02-19 ENCOUNTER — Encounter: Payer: Self-pay | Admitting: Podiatry

## 2022-02-19 ENCOUNTER — Encounter: Payer: Self-pay | Admitting: Physical Therapy

## 2022-02-19 DIAGNOSIS — M25672 Stiffness of left ankle, not elsewhere classified: Secondary | ICD-10-CM

## 2022-02-19 DIAGNOSIS — M6281 Muscle weakness (generalized): Secondary | ICD-10-CM

## 2022-02-19 DIAGNOSIS — S96919D Strain of unspecified muscle and tendon at ankle and foot level, unspecified foot, subsequent encounter: Secondary | ICD-10-CM | POA: Diagnosis not present

## 2022-02-19 DIAGNOSIS — Z9889 Other specified postprocedural states: Secondary | ICD-10-CM

## 2022-02-19 DIAGNOSIS — M79672 Pain in left foot: Secondary | ICD-10-CM | POA: Diagnosis not present

## 2022-02-19 DIAGNOSIS — R262 Difficulty in walking, not elsewhere classified: Secondary | ICD-10-CM

## 2022-02-20 ENCOUNTER — Other Ambulatory Visit: Payer: Self-pay | Admitting: Podiatry

## 2022-02-20 MED ORDER — CELECOXIB 100 MG PO CAPS: 100.0000 mg | ORAL_CAPSULE | Freq: Two times a day (BID) | ORAL | 0 refills | Status: DC

## 2022-02-20 NOTE — Therapy (Addendum)
OUTPATIENT PHYSICAL THERAPY TREATMENT NOTE / DISCHARGE   Patient Name: Haley White MRN: [id number] DOB:1956-08-12, 66 y.o., female Today's Date: 02/21/2022       PT End of Session - 02/21/22 1024     Visit Number 34    Number of Visits 40    Date for PT Re-Evaluation 02/23/22    Authorization Type MCR    Authorization - Visit Number 17    Authorization - Number of Visits 40    Progress Note Due on Visit 71    PT Start Time 1018    PT Stop Time 8502    PT Time Calculation (min) 29 min                     Past Medical History:  Diagnosis Date   Anxiety    Arthritis    Asthma    Cataract    Gout    Hyperlipidemia    Hypertension    Obese    Past Surgical History:  Procedure Laterality Date   APPENDECTOMY  1984   CATARACT EXTRACTION W/ INTRAOCULAR LENS  IMPLANT, BILATERAL  2014   CESAREAN SECTION  1984, 1987, 1994   X3   COLONOSCOPY  11/16/2013   w/Brodie    FOOT SURGERY Left 2002   KNEE SURGERY Left 1998   ROTATOR CUFF REPAIR Right 2007   TUBAL LIGATION  1994   Patient Active Problem List   Diagnosis Date Noted   Allergic rhinitis 03/15/2021   Allergic rhinitis due to animal (cat) (dog) hair and dander 03/15/2021   Allergic rhinitis due to pollen 03/15/2021   Gastro-esophageal reflux disease without esophagitis 03/15/2021   Mild persistent asthma, uncomplicated 77/41/2878   Aortic atherosclerosis ([place]) by Chest CT scan on 02/02/2018  01/10/2021   Asthma    Low back pain 03/17/2019   Lumbosacral spondylosis without myelopathy 03/17/2019   Cubital tunnel syndrome 10/14/2018   Closed fracture of distal end of right radius 07/09/2018   Pseudogout 12/09/2017   Major depressive disorder, recurrent, severe without psychotic features ([place])    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   Suicide attempt by drug ingestion ([place])    Medication management 08/22/2014   Vitamin D deficiency 08/22/2014   Hyperlipidemia, mixed 08/22/2014   Hypertension     Anxiety    Abnormal glucose    Class 2 severe obesity due to excess calories with serious comorbidity and body mass index (BMI) of 37.0 to 37.9 in adult [facility])     PCP: Unk Pinto, MD  REFERRING PROVIDER: Trula Slade, DPM  REFERRING DIAG: [phone] (ICD-10-CM) - Tendon tear, ankle, subsequent encounter  THERAPY DIAG:  Pain in left foot  Stiffness of left ankle, not elsewhere classified  Muscle weakness (generalized)  Tendon tear, ankle, unspecified laterality, subsequent encounter  Difficulty in walking, not elsewhere classified  Status post left foot surgery    SUBJECTIVE:   SUBJECTIVE STATEMENT:   I twisted my foot over the weekend in my tennis shoe as it was stuck on the linoleum floor and today after the taping by PT Voncille Lo) top of my foot is swollen.  I stayed off of my foot and the swelling is better today.  I contacted Dr Jacqualyn Posey and I have an appt on May 23 and he agrees with my PT to place on hold until Ms Goding is seen by Dr Vista Deck on 03-26-22 and her new rheumatologist, Dr. Vernelle Emerald on 03-21-22.      PERTINENT HISTORY:  two Lt ankle surgeries within 8 months; HTN; anxiety; global OA  PAIN: 3/10 Left knee Are you having pain? Yes NPRS scale: 5/10 in left foot at rest on top of foot, Pain on lateral foot is 2/10   Pain location: Lt lateral ankle, plantar foot , recent top of foot between lateral 3 metatarsals PAIN TYPE: tight, pulling  sharp pain on top of foot Pain description: intermittent  Aggravating factors: prolonged standing/walking  Relieving factors: unknown   PRECAUTIONS: None  WEIGHT BEARING RESTRICTIONS No    OBJECTIVE:   IMAGING:  06/06/2021: MRI L Ankle Without Contrast: IMPRESSION: 1. 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 Long segment longitudinal split tear of the peroneus brevis tendon. Peroneus longus tendon is laterally subluxed. 2. Chronic Achilles tendinopathy with small  partial-thickness interstitial tears distally, similar to prior. 3. Moderate arthropathy within the midfoot and TMT joints, not significantly progressed from prior.  PATIENT SURVEYS:  FOTO 52%  02-07-22 56%  PALPATION: Diffuse tenderness about Lt lateral ankle  LE AROM/PROM:  A/PROM Right 02/21/2022 Left 02/21/2022  Hip flexion    Hip extension    Hip abduction    Hip adduction    Hip internal rotation    Hip external rotation    Knee flexion    Knee extension    Ankle dorsiflexion 15 11  Ankle plantarflexion 60 54 mild pain on top of foot lateral 3 metatarsals  Ankle inversion 39 33 mild pain  Ankle eversion 31 20 with pain on top of foot lateral 3 Metatarsals   (Blank rows = not tested)  LE MMT:  MMT Right 02/21/2022 Left 02/21/2022  Hip flexion 5 5  Hip extension 4+ 4+  Hip abduction 4+ 4+  Hip adduction    Hip internal rotation    Hip external rotation    Knee flexion    Knee extension    Ankle dorsiflexion 5/5 4+/5 pn  Ankle plantarflexion 30 SL calf raises   5/5 Unable SL calf raises difficulty with eccentric lowering even one time  Ankle inversion 5/5 4+/5 pn  Ankle eversion 5/5 Pn with resistance   (Blank rows = not tested)  FUNCTIONAL TESTS:  6 MWT: 1130 ft; 4/10 pain Lt lateral ankle 02/05/22  6 MWT: 1356  Pain 4/10 ft; pain Lt lateral ankle 02-21-22 6MWT  2 rounds unable to complete after 2.5 min due to pain6 MWT  02-21-22  552 ft  SLS: 5 seconds LLE; 30 seconds RLE; 01/21/22: 5 seconds LLE   02-21-22  LLE 30 sec RLE, 3-5 LLE with pain on top of foot without brace GAIT: Distance walked: 6 MWT Assistive device utilized: None Level of assistance: Complete Independence Comments: no push off LLE, maintains Lt foot ER     TODAY'S TREATMENT: [facility] Adult PT Treatment:                                                DATE: 02-21-22 Attempted 6 MWT  02-21-22  552 ft 02/05/22  6 MWT: 1356   reduced due to pain  Self Care: PRICE  protection, rest, Ice, compression and  elevation post acute injury Protection of foot, problem solving for upcoming vacation to protect foot while healing  [facility] Adult PT Treatment:  DATE: 02-19-22 Therapeutic Exercise: Short foot with L foot with Green TB under ball of left great toe, working dropping 1st  Toe yoga Tennis ball between bil heels and heel raise x 10 . Pain in Left lateral 3,4 and 5 MT of left foot Hip abduction 2 x 10 on L and R Bridging 3  x 10 GTB resistance for plantarflexion 3 x 10 with decreased pain Sit to stand 1 x 10 with pain in left foot  Manual Therapy: KT tape for support of arch and pain over top of foot at 100% stretch STW and mobs for metatarsals and mx on left foot GAIT-  Pt progressed up and down steps with alternating stepping ascending but only can descend one step at a time without pain in foot especially over L 4th and 5th metatarsal.   [facility] Adult PT Treatment:                                                DATE: 02-14-22 Therapeutic Exercise: Heel raise bil x 15 Hip ext bil 2 x 10 with RTB resistance Hip abd bil 2 x 10 with RTB resistance Standing hip flexor marching 20 x with RTB Monster walk 6 x 50 feet Mini lunge using walking stick with r foot leading ( left foot with some pain) and then Left foot leading (no pain in left foot)  10 x each   Neuromuscular re-ed: Multi angle step down from 2 inch step with R foot  and then from Left foot x 15 each  Multi angle step down from 2 inch step with There ex pad with R foot  and then from Left foot x 15 each  SLS on foam , painful, unable to do > 5 sec without UE working on bil SLS for 68mnutes  Narrow stance on Airex   Tandem on Airex with no  up to UE 30 sec each side continuing for 3 min Tandem on Airex with head turns  up to 30 sec  continuing for 3 min  Self Care: [employer] cOncologist basics of fitness and injury prevention  [facility] Adult  PT Treatment:                                                DATE: 02-12-22 Therapeutic Exercise: NuStep L4 UE and LE x 6 min Tabata on 20 sec off 10, RPE 5/6 Leg press Omega 25 # bil 25 x Leg press Omega L only 25 # 2 x 10 Heel raise  bil on Omega 20 x 25 # Heel raise Omega Left only 15 # 2 x 10 VC for bearing wt through great toe Heel raise at counter 20 x, Left,  attempted concentric heel raise, too painful only able to do 2 x Heel eccentric lowering with bil heel raise with 3 count tempo  x 8 to fatigue  Neuromuscular re-ed: Multi angle step down from there ex pad SLS on foam , painful, unable to do > 5 sec without UE working on bil SLS for 556mutes  Narrow stance on Airex   Tandem on Airex with no UE 30 sec each side continuing for 3 min Tandem on Airex with head  turns 30 sec  continuing for 3 min  [facility] Adult PT Treatment:                                                DATE: 02-07-22 Therapeutic Exercise: BTB resisted DF, EV,IN and PF x 15 each then performed 6 MWT Arch lift 2 x 10 Calf raise on foam attempted; pain  Calf stretch at wall x 60 seconds  Seated calf raise 2 x 15; 15 lb kettlebell  Step down on 4 inch step with increased L ankle pain Step down on 2 inch step with L remaining on step Step down on There ex pad 30 x heel raise on R Attempted heel raise on L but unable due to pain Attempted eccentric lowering with PT asssit x 3 but could not tolerate due to pain Trap bar (45#) lift form 14 in boxes x 10  pain in Left knee Spanish squat with 10 # KB with purple power band for Spanish squat 2 x 10 Hip hinge to chair x 10 with VC   Neuromuscular re-ed: Multi angle step down from 2 inch step  In corner SLS on foam , painful, unable to do > 15 sec without UE working on unilateral SLS for 58mnutes  Narrow stance on Airex   Tandem on Airex with no UE  up to 30 sec each side continuing for 3 min Tandem on level ground  with  10# KB  around the world up to  5 to 15 sec   continuing for 3 min   [facility] Adult PT Treatment:                                                DATE: 02-05-22  6 MWT: 1356  Pain 4/10 ft; pain Lt lateral ankle  Therapeutic Exercise: Attempted Elliptical for 3 min but pain increased in lateral foot so DC's  GTB resisted DF, EV,IN and PF x 15 each then performed 6 MWT Sitting heel raises 15 x bil Slant board gastroc stretch 3 x 30 sec by treadmill UE rail Reformer footwork 2 red 1 blue: Heels, arch and toes in parallel and then turnout all with double leg. 2 red 1 Green heels with turnout x 15 Alternating  DF stretch on reformer 2 Red  Neuromuscular re-ed: SLS on foam , painful, unable to do > 5 sec without UE working on bil SLS for 3 minutes  Narrow stance on Airex   Tandem on Airex with no UE 30 sec each side Tandem on Airex with head turns 45 sec   Modalities: Ice massage 8 min   [facility] Adult PT Treatment:                                                DATE: 01/30/22 Therapeutic Exercise: NuStep L5 UE and LE x 5 min  Reformer footwork 2 red 1 blue: Heels, arch and toes in parallel and then turnout all with double leg. 2 red 1 Green heels with turnout x 15 Alternating calf raise pain with full DF, modified ROM 2 red  calf raise x 15 with min pain 2/10 Single leg press out 2 red 1 yellow  Windshield wiper for hip IR  SLS on foam , painful, unable to do > 5 sec without UE  Narrow stance on Airex   Tandem on Airex with no UE 30 sec each side Tandem on Airex with head turns 45 sec  Self Care: Tight hips (IR) vs ankle eversion          PATIENT EDUCATION:  Education details: see treatment above Person educated: Patient Education method: Consulting civil engineer, demo, handout  Education comprehension: verbalized understanding, returned demo    HOME EXERCISE PROGRAM: Access Code [place] Added aquatics 12/13/21  ASSESSMENT:  CLINICAL IMPRESSION:  Ms Mordan returns to clinic with 5/10 pain on top of foot over lateral 3 Left  metatarsals, now returned to wearing ASO brace for support.  Pt twisted left foot in sneaker while turning on linoleum floor this past weekend (April 15) and pt continues to have irritated foot pain. Original lateral foot pain is 2/10 and improved but new pain limits pt ability to progress with more challenging exercise.  Pt/PT have discussed with MD to put Ms Brosh on hold while she has foot reevaluated by Dr Jacqualyn Posey on 03-26-22 and by a rheumatologist, Ignacia Marvel MD on 03-21-22.  Pt was unable to complete 6 MWT today and has trouble bearing weight on foot without pain even in ASO brace.  Pt has HEP but will first be evaluated for current new pain in foot over different area as described above. Pt will contact clinic with new referral after pt assessed by MDs.    OBJECTIVE IMPAIRMENTS Abnormal gait, decreased activity tolerance, decreased balance, difficulty walking, decreased strength, and pain.    GOALS:   SHORT TERM GOALS:  Pt will report understanding and adherence to her HEP in order to promote independence in the management of her primary impairments.  Target date:  09/27/21 Goal status: MET  2.  Pt will demonstrate ability to tolerate lateral weightshifting in standing in order to transition to walking with less limitation.  Target date: 09/27/21 Goal status: MET   LONG TERM GOALS:  Pt will achieve a FOTO score of 55% or higher in order to demonstrate improved functional ability as it relates to her L ankle pain. Baseline: 43   56% on 02-07-22 Target date:  10/25/21 Goal status: MET  2.  Pt will demonstrate the ability to perform a 6-minute walk test without CAM boot or AD at self-selected pace in order to progress to safe community ambulation. Baseline: unable Target date: 10/25/21 Goal status: MET  3.  Pt will demonstrate Lt ankle inversion/eversion AROM WNL with 0-2/10 pain in order to put on her socks and shoes without limitation. Baseline: 18 inversion; 4 eversion   Target date: 10/25/21 Goal status: MET  4.  Pt will demonstrate BIL global hip strength of 5/5 in order to independently progress her LE strengthening regimen without limitation. Baseline: 4- to 5/5  Target date: 10/25/21 Goal status: partially met   5.  Patient will demonstrate 5/5 Lt ankle inversion and eversion strength and 3/5 Lt ankle plantarflexion strength to improve stability necessary for community ambulation. Baseline: MMT deferred secondary to post-op acuity  Target date: 12/05/21 Goal status: revised   6.  Patient will maintain SLS on the LLE for at least 5 seconds to improve stability when walking on uneven terrain. Baseline: unable Target date: 01/02/22 Goal status: MET  7.  Patient will be able to  descend stairs utilizing reciprocal pattern Baseline: step-to . Working on step down on 2 inch step Target date: 02/18/22 Goal status: new  PLAN: PT FREQUENCY: 2x/week  PT DURATION: other: 4-6 weeks  PLANNED INTERVENTIONS: Therapeutic exercises, Therapeutic activity, Neuromuscular re-education, Balance training, Gait training, Patient/Family education, Joint mobilization, Stair training, Aquatic Therapy, Dry Needling, Electrical stimulation, Cryotherapy, Moist heat, Taping, Vasopneumatic device, and Manual therapy  PLAN FOR NEXT SESSION: progress Lt ankle strength  and balance as tolerated   Cont POC, standing as tol, SLS and footwork  sit to stand and lunge and encourage neuromuscular of knee  Pt placed on hold until MD evaluation  Voncille Lo, PT, [place] Certified Exercise Expert for the Aging Adult  02/21/22 12:31 PM Phone: [phone] Fax: [phone]      PHYSICAL THERAPY DISCHARGE SUMMARY  Visits from Start of Care: 34  Current functional level related to goals / functional outcomes: See goals   Remaining deficits: Current status unknown    Education / Equipment: HEP   Patient agrees to discharge. Patient goals were partially met. Patient is  being discharged due to not returning since the last visit.    Kristoffer Leamon PT, DPT, LAT, ATC  04/15/22  11:55 AM

## 2022-02-21 ENCOUNTER — Encounter: Payer: Self-pay | Admitting: Physical Therapy

## 2022-02-21 ENCOUNTER — Ambulatory Visit: Payer: Medicare HMO | Admitting: Physical Therapy

## 2022-02-21 DIAGNOSIS — S96919D Strain of unspecified muscle and tendon at ankle and foot level, unspecified foot, subsequent encounter: Secondary | ICD-10-CM | POA: Diagnosis not present

## 2022-02-21 DIAGNOSIS — R262 Difficulty in walking, not elsewhere classified: Secondary | ICD-10-CM

## 2022-02-21 DIAGNOSIS — M25672 Stiffness of left ankle, not elsewhere classified: Secondary | ICD-10-CM

## 2022-02-21 DIAGNOSIS — Z9889 Other specified postprocedural states: Secondary | ICD-10-CM

## 2022-02-21 DIAGNOSIS — M79672 Pain in left foot: Secondary | ICD-10-CM | POA: Diagnosis not present

## 2022-02-21 DIAGNOSIS — M6281 Muscle weakness (generalized): Secondary | ICD-10-CM

## 2022-02-22 DIAGNOSIS — J301 Allergic rhinitis due to pollen: Secondary | ICD-10-CM | POA: Diagnosis not present

## 2022-02-24 DIAGNOSIS — F411 Generalized anxiety disorder: Secondary | ICD-10-CM | POA: Diagnosis not present

## 2022-02-24 DIAGNOSIS — Z62820 Parent-biological child conflict: Secondary | ICD-10-CM | POA: Diagnosis not present

## 2022-02-24 DIAGNOSIS — R69 Illness, unspecified: Secondary | ICD-10-CM | POA: Diagnosis not present

## 2022-02-24 DIAGNOSIS — F331 Major depressive disorder, recurrent, moderate: Secondary | ICD-10-CM | POA: Diagnosis not present

## 2022-02-25 DIAGNOSIS — M25562 Pain in left knee: Secondary | ICD-10-CM | POA: Diagnosis not present

## 2022-03-01 DIAGNOSIS — J3089 Other allergic rhinitis: Secondary | ICD-10-CM | POA: Diagnosis not present

## 2022-03-01 DIAGNOSIS — J301 Allergic rhinitis due to pollen: Secondary | ICD-10-CM | POA: Diagnosis not present

## 2022-03-01 DIAGNOSIS — J3081 Allergic rhinitis due to animal (cat) (dog) hair and dander: Secondary | ICD-10-CM | POA: Diagnosis not present

## 2022-03-03 DIAGNOSIS — R69 Illness, unspecified: Secondary | ICD-10-CM | POA: Diagnosis not present

## 2022-03-03 DIAGNOSIS — F331 Major depressive disorder, recurrent, moderate: Secondary | ICD-10-CM | POA: Diagnosis not present

## 2022-03-03 DIAGNOSIS — F411 Generalized anxiety disorder: Secondary | ICD-10-CM | POA: Diagnosis not present

## 2022-03-03 DIAGNOSIS — Z62820 Parent-biological child conflict: Secondary | ICD-10-CM | POA: Diagnosis not present

## 2022-03-04 DIAGNOSIS — M25562 Pain in left knee: Secondary | ICD-10-CM | POA: Diagnosis not present

## 2022-03-05 DIAGNOSIS — J45909 Unspecified asthma, uncomplicated: Secondary | ICD-10-CM | POA: Diagnosis not present

## 2022-03-05 DIAGNOSIS — G8929 Other chronic pain: Secondary | ICD-10-CM | POA: Diagnosis not present

## 2022-03-05 DIAGNOSIS — F909 Attention-deficit hyperactivity disorder, unspecified type: Secondary | ICD-10-CM | POA: Diagnosis not present

## 2022-03-05 DIAGNOSIS — F419 Anxiety disorder, unspecified: Secondary | ICD-10-CM | POA: Diagnosis not present

## 2022-03-05 DIAGNOSIS — Z79899 Other long term (current) drug therapy: Secondary | ICD-10-CM | POA: Diagnosis not present

## 2022-03-05 DIAGNOSIS — Z791 Long term (current) use of non-steroidal anti-inflammatories (NSAID): Secondary | ICD-10-CM | POA: Diagnosis not present

## 2022-03-05 DIAGNOSIS — R69 Illness, unspecified: Secondary | ICD-10-CM | POA: Diagnosis not present

## 2022-03-05 DIAGNOSIS — I10 Essential (primary) hypertension: Secondary | ICD-10-CM | POA: Diagnosis not present

## 2022-03-05 DIAGNOSIS — Z7951 Long term (current) use of inhaled steroids: Secondary | ICD-10-CM | POA: Diagnosis not present

## 2022-03-05 DIAGNOSIS — Z6837 Body mass index (BMI) 37.0-37.9, adult: Secondary | ICD-10-CM | POA: Diagnosis not present

## 2022-03-05 DIAGNOSIS — E785 Hyperlipidemia, unspecified: Secondary | ICD-10-CM | POA: Diagnosis not present

## 2022-03-07 DIAGNOSIS — J301 Allergic rhinitis due to pollen: Secondary | ICD-10-CM | POA: Diagnosis not present

## 2022-03-10 DIAGNOSIS — F411 Generalized anxiety disorder: Secondary | ICD-10-CM | POA: Diagnosis not present

## 2022-03-10 DIAGNOSIS — F331 Major depressive disorder, recurrent, moderate: Secondary | ICD-10-CM | POA: Diagnosis not present

## 2022-03-10 DIAGNOSIS — R69 Illness, unspecified: Secondary | ICD-10-CM | POA: Diagnosis not present

## 2022-03-10 DIAGNOSIS — Z62820 Parent-biological child conflict: Secondary | ICD-10-CM | POA: Diagnosis not present

## 2022-03-18 ENCOUNTER — Encounter: Payer: Self-pay | Admitting: Podiatry

## 2022-03-18 ENCOUNTER — Encounter: Payer: Self-pay | Admitting: Nurse Practitioner

## 2022-03-18 ENCOUNTER — Other Ambulatory Visit: Payer: Self-pay | Admitting: Podiatry

## 2022-03-18 ENCOUNTER — Ambulatory Visit: Payer: Medicare HMO | Admitting: Nurse Practitioner

## 2022-03-18 VITALS — BP 127/87 | HR 88 | Wt 210.0 lb

## 2022-03-18 DIAGNOSIS — R519 Headache, unspecified: Secondary | ICD-10-CM

## 2022-03-18 DIAGNOSIS — J069 Acute upper respiratory infection, unspecified: Secondary | ICD-10-CM

## 2022-03-18 DIAGNOSIS — R051 Acute cough: Secondary | ICD-10-CM

## 2022-03-18 DIAGNOSIS — R509 Fever, unspecified: Secondary | ICD-10-CM

## 2022-03-18 DIAGNOSIS — M1990 Unspecified osteoarthritis, unspecified site: Secondary | ICD-10-CM

## 2022-03-18 DIAGNOSIS — R52 Pain, unspecified: Secondary | ICD-10-CM

## 2022-03-18 DIAGNOSIS — R0602 Shortness of breath: Secondary | ICD-10-CM | POA: Diagnosis not present

## 2022-03-18 DIAGNOSIS — U071 COVID-19: Secondary | ICD-10-CM

## 2022-03-18 DIAGNOSIS — J029 Acute pharyngitis, unspecified: Secondary | ICD-10-CM

## 2022-03-18 MED ORDER — NIRMATRELVIR/RITONAVIR (PAXLOVID)TABLET: 3.0000 | ORAL_TABLET | Freq: Two times a day (BID) | ORAL | 0 refills | Status: DC

## 2022-03-18 MED ORDER — NIRMATRELVIR/RITONAVIR (PAXLOVID)TABLET: 3.0000 | ORAL_TABLET | Freq: Two times a day (BID) | ORAL | 0 refills | Status: AC

## 2022-03-18 MED ORDER — DEXAMETHASONE 4 MG PO TABS: ORAL_TABLET | ORAL | 0 refills | Status: DC

## 2022-03-18 MED ORDER — PROMETHAZINE-DM 6.25-15 MG/5ML PO SYRP: ORAL_SOLUTION | ORAL | 1 refills | Status: DC

## 2022-03-18 NOTE — Progress Notes (Signed)
Virtual telephone visit ? ?  ? ?Assessment & Plan:  ?  ?1. COVID-19 ?Continue to monitor for increase in shortness of breath.   ?Call 911 immediately if oxygen saturation is <88%. ? ?- dexamethasone (DECADRON) 4 MG tablet; Take       1 tab 3 x day      for 2 days, then 2 x day for 2  days, then 1 tab daily  Dispense: 13 tablet; Refill: 0 ?- promethazine-dextromethorphan (PROMETHAZINE-DM) 6.25-15 MG/5ML syrup; Take 5 mL as needed at bedtime for severe cough  Dispense: 360 mL; Refill: 1 ?- nirmatrelvir/ritonavir EUA (PAXLOVID) 20 x 150 MG & 10 x 100MG TABS; Take 3 tablets by mouth 2 (two) times daily for 5 days. Take nirmatrelvir (150 mg) two tablets twice daily for 5 days and ritonavir (100 mg) one tablet twice daily for 5 days. Patient GFR is 64.  Dispense: 30 tablet; Refill: 0 ? ?2. Acute URI ?Stay well hydrated to keep mucus thin and productive. ?Suggest Vitamin C, Zinc. ? ?3. Shortness of breath ?Continue inhalers as directed. ? ?4. Acute cough ?Promethazine DM  ? ?5. Sore throat ?Warm salt water gargles several times throughout the day. ?OTC Chloraseptic.  ? ?6. Body aches ?Continue Tylenol ?Rest. ? ?7. Fever, unspecified fever cause ?Continue Tylenol ? ?8. Acute intractable headache, unspecified headache type ?Continue Tylenol ? ?Report to ER or call 911 for any difficulty breathing, chest pain, heart palpations, uncontrolled swelling, uncontrolled fever, N/V.   ?  ?I discussed the assessment and treatment plan with the patient. The patient was provided an opportunity to ask questions and all were answered. The patient agreed with the plan and demonstrated an understanding of the instructions. ?  ?The patient was advised to call back or seek an in-person evaluation if the symptoms worsen or if the condition fails to improve as anticipated. ? ?I provided 20 minutes of non-face-to-face time during this encounter. ? ? ?Virtual Visit via Telephone Note  ? ?This visit type was conducted due to national  recommendations for restrictions regarding the COVID-19 Pandemic (e.g. social distancing) in an effort to limit this patient's exposure and mitigate transmission in our community. Due to her co-morbid illnesses, this patient is at least at moderate risk for complications without adequate follow up. This format is felt to be most appropriate for this patient at this time. The patient did not have access to video technology or had technical difficulties with video requiring transitioning to audio format only (telephone). Physical exam was limited to content and character of the telephone converstion.   ? ?Patient location: Home ?Provider location: [facility], [place] ? ? ?Patient: Haley White   DOB: 06-28-1956   66 y.o. Female  MRN: [id number] ?Visit Date: 03/18/2022 ? ?Today's Provider: Darrol Jump, NP  ?Subjective:  ?  ?Chief Complaint  ?Patient presents with  ? Covid Positive  ? ? ?I connected with  [name] on 03/18/22 by a video enabled telemedicine application and verified that I am speaking with the correct person using two identifiers. ?  ?I discussed the limitations of evaluation and management by telemedicine. The patient expressed understanding and agreed to proceed. ? ?66 yo female with PMH of obesity, asthma, HTN, reports an in home POSITIVE Covid test x 1 day after a return flight from [place] x 2 days ago.  Endorses cough, HA, sore throat, body aches, intermittent fever from 98.7-100.4.  ? ?She has been using Symbicort as directed.  Using Albuterol daily PRN.  She also takes Allegra, Xyzal, Singulair.  She does not have a nebulizer. ? ? ?Her oxygen saturation has been steady around ~91%.  ? ?She has been taking Tylenol for treatment.   ? ?Her BP has been stable and well controlled.  She reports today's BP was 127/87.    ?  ?Allergies:  ?Allergies  ?Allergen Reactions  ? Molds & Smuts Anaphylaxis  ? Biaxin [Clarithromycin]   ?  GI upset  ? Serevent  [Salmeterol]   ?  Palpitations  ? Tequin [Gatifloxacin]   ?  GI upset. thrush  ? ?Medical History:  ?has Hypertension; Anxiety; Abnormal glucose; Class 2 severe obesity due to excess calories with serious comorbidity and body mass index (BMI) of 37.0 to 37.9 in adult [facility]); Medication management; Vitamin D deficiency; Hyperlipidemia, mixed; Suicide attempt by drug ingestion ([place]); Steroid-induced depression; Major depressive disorder, recurrent, severe without psychotic features ([place]); Pseudogout; Asthma; Closed fracture of distal end of right radius; Cubital tunnel syndrome; Low back pain; Lumbosacral spondylosis without myelopathy; Aortic atherosclerosis ([place]) by Chest CT scan on 02/02/2018 ; Allergic rhinitis; Allergic rhinitis due to animal (cat) (dog) hair and dander; Allergic rhinitis due to pollen; Gastro-esophageal reflux disease without esophagitis; and Mild persistent asthma, uncomplicated on their problem list. ?Surgical History:  ?She  has a past surgical history that includes Knee surgery (Left, 1998); Foot surgery (Left, 2002); Appendectomy (1984); Cataract extraction w/ intraocular lens  implant, bilateral (2014); Cesarean section (1984, 1987, 1994); Tubal ligation (1994); Rotator cuff repair (Right, 2007); and Colonoscopy (11/16/2013). ?Family History:  ?Herfamily history includes Anxiety disorder in her sister; Dementia in her father. ?Social History:  ? reports that she quit smoking about 44 years ago. Her smoking use included cigarettes. She has a 7.00 pack-year smoking history. She has never used smokeless tobacco. She reports current alcohol use of about 4.0 standard drinks per week. She reports that she does not use drugs. ? ? ?Medications: ?Outpatient Medications Prior to Visit  ?Medication Sig  ? albuterol (VENTOLIN HFA) 108 (90 Base) MCG/ACT inhaler Inhale 2 puffs into the lungs every 4 (four) hours as needed for wheezing or shortness of breath.  ? Ascorbic Acid (VITAMIN C PO) Take 1 tablet  by mouth.  ? B Complex Vitamins (VITAMIN B-COMPLEX PO) Take 1 tablet by mouth daily.  ? buPROPion (WELLBUTRIN XL) 300 MG 24 hr tablet Take 300 mg by mouth every morning.  ? Calcium Carbonate (CALCIUM 600 PO) Take by mouth daily.  ? celecoxib (CELEBREX) 100 MG capsule Take 1 capsule (100 mg total) by mouth 2 (two) times daily.  ? EPINEPHrine 0.3 mg/0.3 mL IJ SOAJ injection See admin instructions.  ? fexofenadine (ALLEGRA) 180 MG tablet 1 tablet  ? FLUoxetine (PROZAC) 20 MG capsule Take 1 capsule (20 mg total) by mouth daily. For depression  ? fluticasone (FLONASE) 50 MCG/ACT nasal spray SHAKE LQ AND U 1 TO 2 SPRAYS IEN QD  ? levocetirizine (XYZAL) 5 MG tablet TK 1 T PO QD IN THE EVE  ? lisinopril (ZESTRIL) 20 MG tablet TAKE 1 TABLET BY MOUTH DAILY FOR BLOOD PRESSURE  ? methylphenidate (RITALIN) 10 MG tablet TK 1 T PO BID  ? montelukast (SINGULAIR) 10 MG tablet 1 tablet  ? rosuvastatin (CRESTOR) 40 MG tablet TAKE 1 TABLET BY MOUTH DAILY FOR CHOLESTEROL  ? SYMBICORT 160-4.5 MCG/ACT inhaler INL 2 PFS PO BID  ? VITAMIN D PO Take 15,000 Units by mouth daily.  ? gabapentin (NEURONTIN) 300 MG capsule  Take 1 capsule (300 mg total) by mouth daily.  ? Magnesium 400 MG TABS Take 1 tablet by mouth daily. (Patient not taking: Reported on 03/18/2022)  ? ?No facility-administered medications prior to visit.  ? ? ?Review of Systems  ?Constitutional:  Positive for chills and fatigue.  ?HENT:  Positive for congestion, sneezing, sore throat and voice change.   ?Respiratory:  Positive for cough and shortness of breath. Negative for wheezing.   ?Gastrointestinal:  Negative for diarrhea, nausea and vomiting.  ?Musculoskeletal:  Negative for joint swelling.  ?Skin:  Negative for rash.  ?Neurological:  Negative for dizziness.  ? ?Last metabolic panel ?Lab Results  ?Component Value Date  ? GLUCOSE 103 (H) 01/16/2022  ? NA 143 01/16/2022  ? K 4.6 01/16/2022  ? CL 107 01/16/2022  ? CO2 26 01/16/2022  ? BUN 12 01/16/2022  ? CREATININE 0.98  01/16/2022  ? EGFR 64 01/16/2022  ? CALCIUM 9.6 01/16/2022  ? PROT 6.7 01/16/2022  ? ALBUMIN 3.8 05/06/2015  ? BILITOT 0.5 01/16/2022  ? ALKPHOS 68 05/06/2015  ? AST 15 01/16/2022  ? ALT 19 01/16/2022  ? ANIONGAP 12 0

## 2022-03-18 NOTE — Telephone Encounter (Signed)
Please advise 

## 2022-03-19 ENCOUNTER — Telehealth: Payer: Self-pay

## 2022-03-19 NOTE — Telephone Encounter (Signed)
Patient called and said her oxygen level was at 85% when she got up from her nap but her oxygen level had went up since then. I told her to monitor it and it went below 89% consistently then she needed to go to the ER. I called her at 4:50pm to see how she was doing. She said her oxygen was between 92-93%. I told her to keep checking it and that I would call her in the morning to see how she was doing.  ?

## 2022-03-20 ENCOUNTER — Ambulatory Visit (INDEPENDENT_AMBULATORY_CARE_PROVIDER_SITE_OTHER): Payer: Medicare HMO

## 2022-03-20 VITALS — BP 140/86 | HR 77 | Temp 97.3°F

## 2022-03-20 DIAGNOSIS — U071 COVID-19: Secondary | ICD-10-CM | POA: Diagnosis not present

## 2022-03-20 DIAGNOSIS — R0602 Shortness of breath: Secondary | ICD-10-CM | POA: Diagnosis not present

## 2022-03-20 MED ORDER — IPRATROPIUM-ALBUTEROL 0.5-2.5 (3) MG/3ML IN SOLN
3.0000 mL | Freq: Once | RESPIRATORY_TRACT | Status: AC
  Administered 2022-03-20: 3 mL via RESPIRATORY_TRACT

## 2022-03-20 NOTE — Progress Notes (Signed)
Patient presents today for a nurse visit for a breathing treatment. Patient is feeling much better after treatment. ?

## 2022-03-21 ENCOUNTER — Ambulatory Visit: Payer: Medicare HMO | Admitting: Internal Medicine

## 2022-03-22 ENCOUNTER — Telehealth: Payer: Self-pay

## 2022-03-22 ENCOUNTER — Emergency Department (HOSPITAL_BASED_OUTPATIENT_CLINIC_OR_DEPARTMENT_OTHER): Payer: Medicare HMO

## 2022-03-22 ENCOUNTER — Encounter (HOSPITAL_BASED_OUTPATIENT_CLINIC_OR_DEPARTMENT_OTHER): Payer: Self-pay

## 2022-03-22 ENCOUNTER — Other Ambulatory Visit: Payer: Self-pay

## 2022-03-22 ENCOUNTER — Emergency Department (HOSPITAL_BASED_OUTPATIENT_CLINIC_OR_DEPARTMENT_OTHER)
Admission: EM | Admit: 2022-03-22 | Discharge: 2022-03-22 | Disposition: A | Discharge status: Home / Self Care | Payer: Medicare HMO | Attending: Emergency Medicine | Admitting: Emergency Medicine

## 2022-03-22 DIAGNOSIS — Z79899 Other long term (current) drug therapy: Secondary | ICD-10-CM | POA: Insufficient documentation

## 2022-03-22 DIAGNOSIS — E876 Hypokalemia: Secondary | ICD-10-CM | POA: Insufficient documentation

## 2022-03-22 DIAGNOSIS — R079 Chest pain, unspecified: Secondary | ICD-10-CM | POA: Diagnosis not present

## 2022-03-22 DIAGNOSIS — R630 Anorexia: Secondary | ICD-10-CM | POA: Diagnosis not present

## 2022-03-22 DIAGNOSIS — U071 COVID-19: Secondary | ICD-10-CM | POA: Diagnosis not present

## 2022-03-22 DIAGNOSIS — R0602 Shortness of breath: Secondary | ICD-10-CM | POA: Diagnosis present

## 2022-03-22 LAB — CBC
HCT: 37.8 % (ref 36.0–46.0)
Hemoglobin: 13 g/dL (ref 12.0–15.0)
MCH: 31 pg (ref 26.0–34.0)
MCHC: 34.4 g/dL (ref 30.0–36.0)
MCV: 90 fL (ref 80.0–100.0)
Platelets: 342 10*3/uL (ref 150–400)
RBC: 4.2 MIL/uL (ref 3.87–5.11)
RDW: 12.8 % (ref 11.5–15.5)
WBC: 14.6 10*3/uL — ABNORMAL HIGH (ref 4.0–10.5)
nRBC: 0 % (ref 0.0–0.2)

## 2022-03-22 LAB — BASIC METABOLIC PANEL
Anion gap: 14 (ref 5–15)
BUN: 27 mg/dL — ABNORMAL HIGH (ref 8–23)
CO2: 25 mmol/L (ref 22–32)
Calcium: 9.2 mg/dL (ref 8.9–10.3)
Chloride: 104 mmol/L (ref 98–111)
Creatinine, Ser: 0.93 mg/dL (ref 0.44–1.00)
GFR, Estimated: >60 mL/min (ref >60)
Glucose, Bld: 167 mg/dL — ABNORMAL HIGH (ref 70–99)
Potassium: 3.4 mmol/L — ABNORMAL LOW (ref 3.5–5.1)
Sodium: 143 mmol/L (ref 135–145)

## 2022-03-22 IMAGING — DX DG CHEST 1V PORT
1 series · 1 of 1 positions shown · non-contrast
Comparison: [DATE].

CLINICAL DATA: A 66-year-old female presents with pleuritic
bilateral chest pain post COVID.

EXAM:
PORTABLE CHEST 1 VIEW

[chest ap]
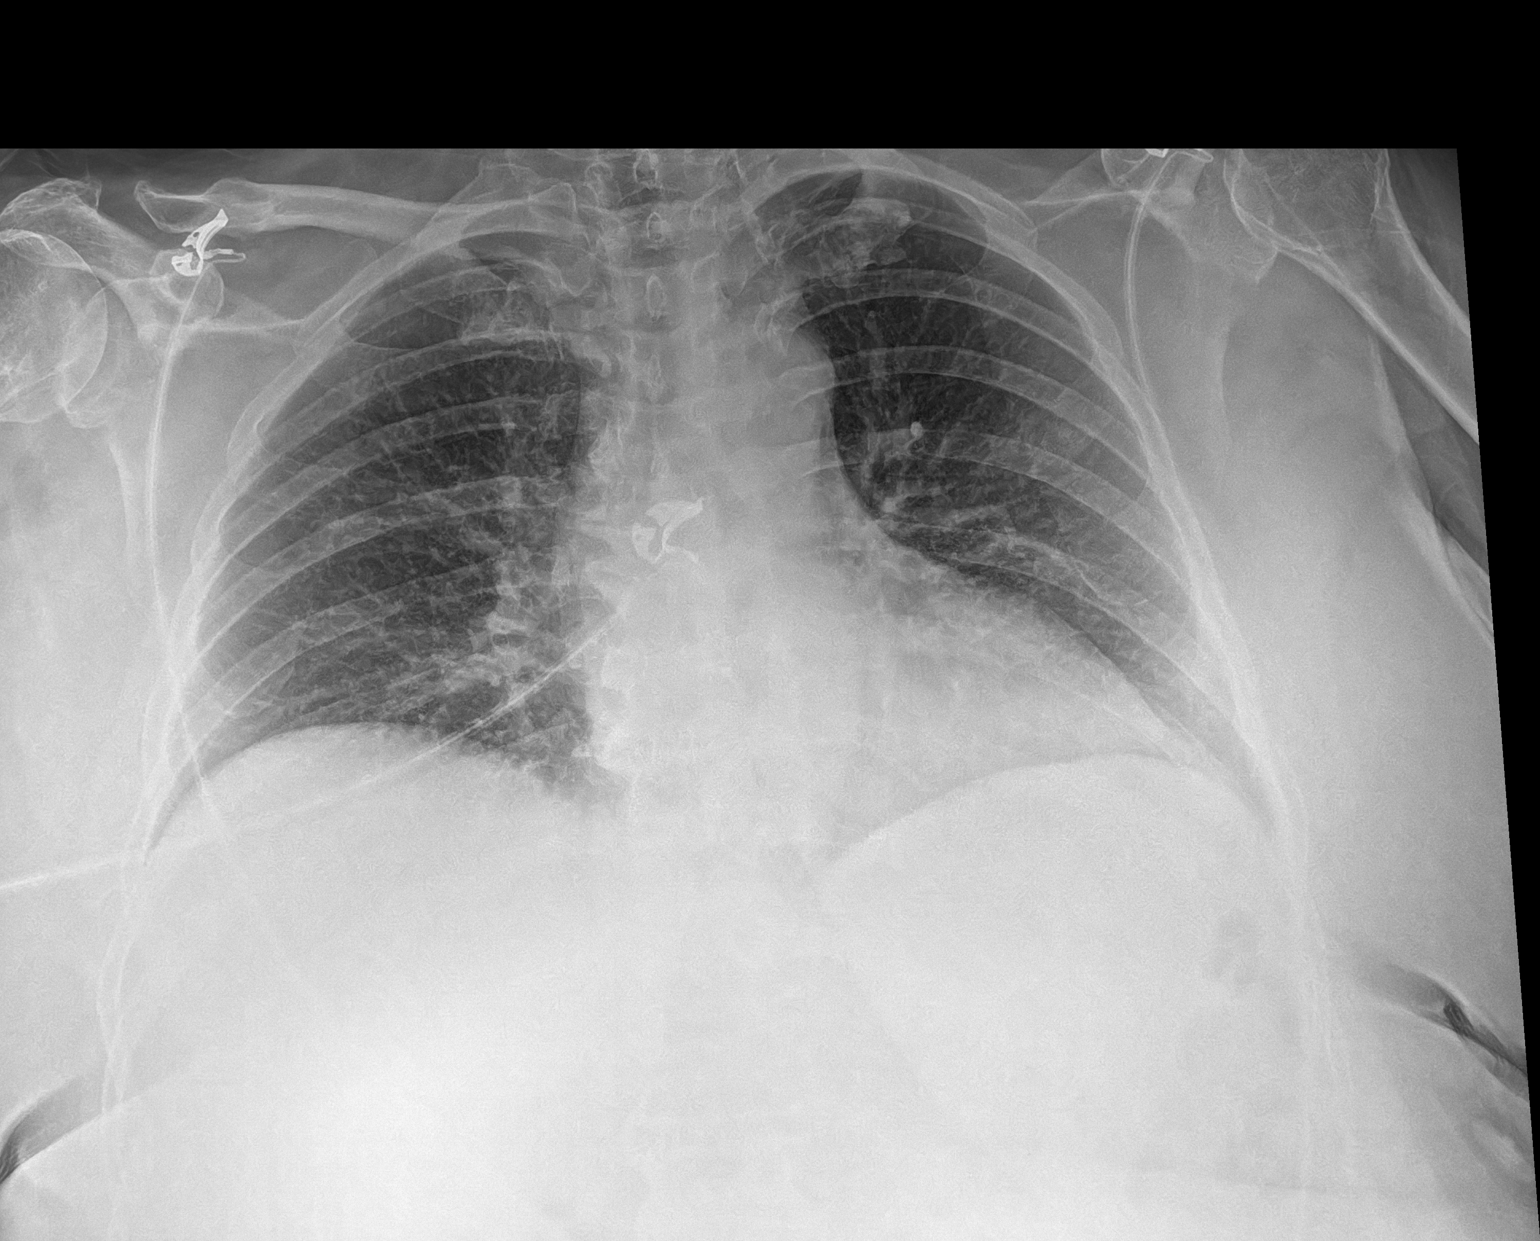

[1 of 1 positions shown; findings below may reference images not displayed]

FINDINGS: EKG leads project over the chest.

Trachea midline.

Cardiomediastinal contours and hilar structures are normal.

Lungs are clear with low normal lung volumes.  No pneumothorax.

On limited assessment there is no acute skeletal finding.
IMPRESSION: Low normal lung volumes without acute cardiopulmonary disease.

## 2022-03-22 MED ORDER — DIPHENHYDRAMINE HCL 50 MG/ML IJ SOLN
12.5000 mg | Freq: Once | INTRAMUSCULAR | Status: AC
  Administered 2022-03-22: 12.5 mg via INTRAVENOUS
  Filled 2022-03-22: qty 1

## 2022-03-22 MED ORDER — ALBUTEROL SULFATE (2.5 MG/3ML) 0.083% IN NEBU
2.5000 mg | INHALATION_SOLUTION | Freq: Once | RESPIRATORY_TRACT | Status: AC
  Administered 2022-03-22: 2.5 mg via RESPIRATORY_TRACT
  Filled 2022-03-22: qty 3

## 2022-03-22 MED ORDER — DIPHENHYDRAMINE HCL 25 MG PO TABS: 25.0000 mg | ORAL_TABLET | Freq: Every evening | ORAL | 0 refills | Status: DC | PRN

## 2022-03-22 MED ORDER — KETOROLAC TROMETHAMINE 30 MG/ML IJ SOLN
30.0000 mg | Freq: Once | INTRAMUSCULAR | Status: AC
  Administered 2022-03-22: 30 mg via INTRAVENOUS
  Filled 2022-03-22: qty 1

## 2022-03-22 MED ORDER — METOCLOPRAMIDE HCL 5 MG/ML IJ SOLN
10.0000 mg | Freq: Once | INTRAMUSCULAR | Status: AC
  Administered 2022-03-22: 10 mg via INTRAVENOUS
  Filled 2022-03-22: qty 2

## 2022-03-22 MED ORDER — SODIUM CHLORIDE 0.9 % IV BOLUS
1000.0000 mL | Freq: Once | INTRAVENOUS | Status: AC
  Administered 2022-03-22: 1000 mL via INTRAVENOUS

## 2022-03-22 MED ORDER — METOCLOPRAMIDE HCL 10 MG PO TABS: 10.0000 mg | ORAL_TABLET | Freq: Two times a day (BID) | ORAL | 0 refills | Status: DC | PRN

## 2022-03-22 MED ORDER — POTASSIUM CHLORIDE ER 10 MEQ PO TBCR: 10.0000 meq | EXTENDED_RELEASE_TABLET | Freq: Every day | ORAL | 0 refills | Status: DC

## 2022-03-22 NOTE — ED Provider Notes (Signed)
[facility] EMERGENCY DEPT Provider Note   CSN: [id number] Arrival date & time: 03/22/22  1437     History {Add pertinent medical, surgical, social history, OB history to HPI:1} Chief Complaint  Patient presents with   Shortness of Breath    Haley White is a 66 y.o. female here with general malaise.  Supplement history provided by her family member at bedside.  They report that the patient began feeling sick 6 days ago with headache, pleuritic chest pain, malaise, poor appetite.  She tested positive for COVID subsequently.  Her PCP started her on Paxlovid on day 4 of symptoms, which she has been taking.  Also started on oral dexamethasone which she has been taking.  She continues to complain of migraine headache, poor appetite, weakness, brain fog.  HPI     Home Medications Prior to Admission medications   Medication Sig Start Date End Date Taking? Authorizing Provider  albuterol (VENTOLIN HFA) 108 (90 Base) MCG/ACT inhaler Inhale 2 puffs into the lungs every 4 (four) hours as needed for wheezing or shortness of breath. 07/17/17   Liane Comber, NP  Ascorbic Acid (VITAMIN C PO) Take 1 tablet by mouth.    [provider]  B Complex Vitamins (VITAMIN B-COMPLEX PO) Take 1 tablet by mouth daily.    [provider]  buPROPion (WELLBUTRIN XL) 300 MG 24 hr tablet Take 300 mg by mouth every morning. 12/17/20   [provider]  Calcium Carbonate (CALCIUM 600 PO) Take by mouth daily.    [provider]  celecoxib (CELEBREX) 100 MG capsule Take 1 capsule (100 mg total) by mouth 2 (two) times daily. 02/20/22   Trula Slade, DPM  dexamethasone (DECADRON) 4 MG tablet Take       1 tab 3 x day      for 2 days, then 2 x day for 2  days, then 1 tab daily 03/18/22   Darrol Jump, NP  EPINEPHrine 0.3 mg/0.3 mL IJ SOAJ injection See admin instructions. 02/08/20   [provider]  fexofenadine (ALLEGRA) 180 MG tablet 1 tablet    [provider]  FLUoxetine (PROZAC) 20 MG capsule Take 1 capsule (20 mg total) by mouth daily. For depression 05/08/15   Lindell Spar I, NP  fluticasone (FLONASE) 50 MCG/ACT nasal spray SHAKE LQ AND U 1 TO 2 SPRAYS IEN QD 10/17/17   [provider]  gabapentin (NEURONTIN) 300 MG capsule Take 1 capsule (300 mg total) by mouth daily. 10/02/21   Trula Slade, DPM  levocetirizine (XYZAL) 5 MG tablet TK 1 T PO QD IN THE EVE 12/07/17   [provider]  lisinopril (ZESTRIL) 20 MG tablet TAKE 1 TABLET BY MOUTH DAILY FOR BLOOD PRESSURE 12/01/21   Magda Bernheim, NP  Magnesium 400 MG TABS Take 1 tablet by mouth daily. Patient not taking: Reported on 03/18/2022    [provider]  methylphenidate (RITALIN) 10 MG tablet TK 1 T PO BID 01/16/18   [provider]  montelukast (SINGULAIR) 10 MG tablet 1 tablet    [provider]  nirmatrelvir/ritonavir EUA (PAXLOVID) 20 x 150 MG & 10 x '100MG'$  TABS Take 3 tablets by mouth 2 (two) times daily for 5 days. Take nirmatrelvir (150 mg) two tablets twice daily for 5 days and ritonavir (100 mg) one tablet twice daily for 5 days. Patient GFR is 64. 03/18/22 03/23/22  Darrol Jump, NP  promethazine-dextromethorphan (PROMETHAZINE-DM) 6.25-15 MG/5ML syrup Take 5 mL as needed  at bedtime for severe cough 03/18/22   Darrol Jump, NP  rosuvastatin (CRESTOR) 40 MG tablet TAKE 1 TABLET BY MOUTH DAILY FOR CHOLESTEROL 11/01/21   Liane Comber, NP  SYMBICORT 160-4.5 MCG/ACT inhaler INL 2 PFS PO BID 01/16/18   [provider]  VITAMIN D PO Take 15,000 Units by mouth daily.    [provider]      Allergies    Molds & smuts, Biaxin [clarithromycin], Serevent [salmeterol], and Tequin [gatifloxacin]    Review of Systems   Review of Systems  Physical Exam Updated Vital Signs BP (!) 153/105 (BP Location: Left Arm)   Pulse 88   Temp 97.9 F (36.6 C) (Oral)   Resp (!) 26   Ht '5\' 3"'$  (1.6 m)   Wt 94.3 kg   LMP  11/26/2011   SpO2 99%   BMI 36.85 kg/m  Physical Exam Constitutional:      General: She is not in acute distress. HENT:     Head: Normocephalic and atraumatic.  Eyes:     Conjunctiva/sclera: Conjunctivae normal.     Pupils: Pupils are equal, round, and reactive to light.  Cardiovascular:     Rate and Rhythm: Normal rate and regular rhythm.  Pulmonary:     Effort: Pulmonary effort is normal. No respiratory distress.     Comments: 97% on room air, expiratory wheezing (faint) bialterally Abdominal:     General: There is no distension.     Tenderness: There is no abdominal tenderness.  Skin:    General: Skin is warm and dry.  Neurological:     General: No focal deficit present.     Mental Status: She is alert. Mental status is at baseline.  Psychiatric:        Mood and Affect: Mood normal.        Behavior: Behavior normal.    ED Results / Procedures / Treatments   Labs (all labs ordered are listed, but only abnormal results are displayed) Labs Reviewed - No data to display  EKG EKG Interpretation  Date/Time:  Friday Mar 22 2022 14:54:22 EDT Ventricular Rate:  81 PR Interval:  142 QRS Duration: 82 QT Interval:  360 QTC Calculation: 418 R Axis:   17 Text Interpretation: Normal sinus rhythm Abnormal ECG When compared with ECG of 07-Mar-2019 08:54, PREVIOUS ECG IS PRESENT Confirmed by Octaviano Glow [phone]) on 03/22/2022 3:09:16 PM  Radiology No results found.  Procedures Procedures  {Document cardiac monitor, telemetry assessment procedure when appropriate:1}  Medications Ordered in ED Medications - No data to display  ED Course/ Medical Decision Making/ A&P                           Medical Decision Making Amount and/or Complexity of Data Reviewed Labs: ordered. Radiology: ordered.  Risk Prescription drug management.   66 yo female here with day 6 of covid illness. Did receive 2 vaccine doses.  +Sick contact (child) in house with same symptoms.  Higher  risk for pulm disease due to hx of asthma/former smoker.  No hypoxia here.  Xray personally interpreted showing ***  Labs reviewed, ordered for dehydration evaluation, showing ***  IV medications for hydration, headache ordered Nebulizer for wheezing She is already on steroids Already on paxlovid  Recommend continued supportive care at home, family will assist her  There is no emergent indication for hospitalization at this time.  Haley White was evaluated in Emergency Department on 03/22/2022 for the  symptoms described in the history of present illness. She was evaluated in the context of the global COVID-19 pandemic, which necessitated consideration that the patient might be at risk for infection with the SARS-CoV-2 virus that causes COVID-19. Institutional protocols and algorithms that pertain to the evaluation of patients at risk for COVID-19 are in a state of rapid change based on information released by regulatory bodies including the CDC and federal and state organizations. These policies and algorithms were followed during the patient's care in the ED.   {Document critical care time when appropriate:1} {Document review of labs and clinical decision tools ie heart score, Chads2Vasc2 etc:1}  {Document your independent review of radiology images, and any outside records:1} {Document your discussion with family members, caretakers, and with consultants:1} {Document social determinants of health affecting pt's care:1} {Document your decision making why or why not admission, treatments were needed:1} Final Clinical Impression(s) / ED Diagnoses Final diagnoses:  None    Rx / DC Orders ED Discharge Orders     None

## 2022-03-22 NOTE — Telephone Encounter (Signed)
Lmom to call back to pick up orthotics

## 2022-03-22 NOTE — ED Triage Notes (Signed)
Pt present to ED from home C/O SOB, chest pain, back pain, fatigue. Pt reports testing COVID + 6 days ago, symptoms worsening since then.

## 2022-03-26 ENCOUNTER — Ambulatory Visit: Payer: Medicare Other | Admitting: Podiatry

## 2022-03-27 ENCOUNTER — Encounter: Payer: Self-pay | Admitting: Internal Medicine

## 2022-03-28 ENCOUNTER — Encounter: Payer: Self-pay | Admitting: Internal Medicine

## 2022-03-28 ENCOUNTER — Encounter (HOSPITAL_BASED_OUTPATIENT_CLINIC_OR_DEPARTMENT_OTHER): Payer: Self-pay

## 2022-03-28 ENCOUNTER — Other Ambulatory Visit: Payer: Self-pay

## 2022-03-28 ENCOUNTER — Emergency Department (HOSPITAL_BASED_OUTPATIENT_CLINIC_OR_DEPARTMENT_OTHER)
Admission: EM | Admit: 2022-03-28 | Discharge: 2022-03-28 | Disposition: A | Discharge status: Home / Self Care | Payer: Medicare HMO | Attending: Emergency Medicine | Admitting: Emergency Medicine

## 2022-03-28 ENCOUNTER — Emergency Department (HOSPITAL_BASED_OUTPATIENT_CLINIC_OR_DEPARTMENT_OTHER): Payer: Medicare HMO

## 2022-03-28 ENCOUNTER — Emergency Department (HOSPITAL_BASED_OUTPATIENT_CLINIC_OR_DEPARTMENT_OTHER): Payer: Medicare HMO | Admitting: Radiology

## 2022-03-28 DIAGNOSIS — R5383 Other fatigue: Secondary | ICD-10-CM | POA: Insufficient documentation

## 2022-03-28 DIAGNOSIS — G43909 Migraine, unspecified, not intractable, without status migrainosus: Secondary | ICD-10-CM | POA: Diagnosis not present

## 2022-03-28 DIAGNOSIS — Z79899 Other long term (current) drug therapy: Secondary | ICD-10-CM | POA: Diagnosis not present

## 2022-03-28 DIAGNOSIS — I10 Essential (primary) hypertension: Secondary | ICD-10-CM | POA: Insufficient documentation

## 2022-03-28 DIAGNOSIS — G43809 Other migraine, not intractable, without status migrainosus: Secondary | ICD-10-CM | POA: Diagnosis not present

## 2022-03-28 DIAGNOSIS — B330 Epidemic myalgia: Secondary | ICD-10-CM | POA: Diagnosis not present

## 2022-03-28 DIAGNOSIS — M791 Myalgia, unspecified site: Secondary | ICD-10-CM

## 2022-03-28 DIAGNOSIS — Z8616 Personal history of COVID-19: Secondary | ICD-10-CM | POA: Diagnosis not present

## 2022-03-28 DIAGNOSIS — D72829 Elevated white blood cell count, unspecified: Secondary | ICD-10-CM | POA: Diagnosis not present

## 2022-03-28 DIAGNOSIS — R0602 Shortness of breath: Secondary | ICD-10-CM | POA: Diagnosis not present

## 2022-03-28 DIAGNOSIS — R519 Headache, unspecified: Secondary | ICD-10-CM | POA: Diagnosis not present

## 2022-03-28 LAB — BASIC METABOLIC PANEL
Anion gap: 9 (ref 5–15)
BUN: 19 mg/dL (ref 8–23)
CO2: 27 mmol/L (ref 22–32)
Calcium: 8.1 mg/dL — ABNORMAL LOW (ref 8.9–10.3)
Chloride: 103 mmol/L (ref 98–111)
Creatinine, Ser: 0.8 mg/dL (ref 0.44–1.00)
GFR, Estimated: >60 mL/min (ref >60)
Glucose, Bld: 92 mg/dL (ref 70–99)
Potassium: 4.6 mmol/L (ref 3.5–5.1)
Sodium: 139 mmol/L (ref 135–145)

## 2022-03-28 LAB — CBC
HCT: 38.7 % (ref 36.0–46.0)
Hemoglobin: 13.1 g/dL (ref 12.0–15.0)
MCH: 30.4 pg (ref 26.0–34.0)
MCHC: 33.9 g/dL (ref 30.0–36.0)
MCV: 89.8 fL (ref 80.0–100.0)
Platelets: 302 10*3/uL (ref 150–400)
RBC: 4.31 MIL/uL (ref 3.87–5.11)
RDW: 12.7 % (ref 11.5–15.5)
WBC: 17.6 10*3/uL — ABNORMAL HIGH (ref 4.0–10.5)
nRBC: 0 % (ref 0.0–0.2)

## 2022-03-28 IMAGING — DX DG CHEST 2V
2 series · 2 of 2 positions shown · non-contrast
Comparison: [DATE]

CLINICAL DATA: Short of breath

EXAM:
CHEST - 2 VIEW

[chest pa]
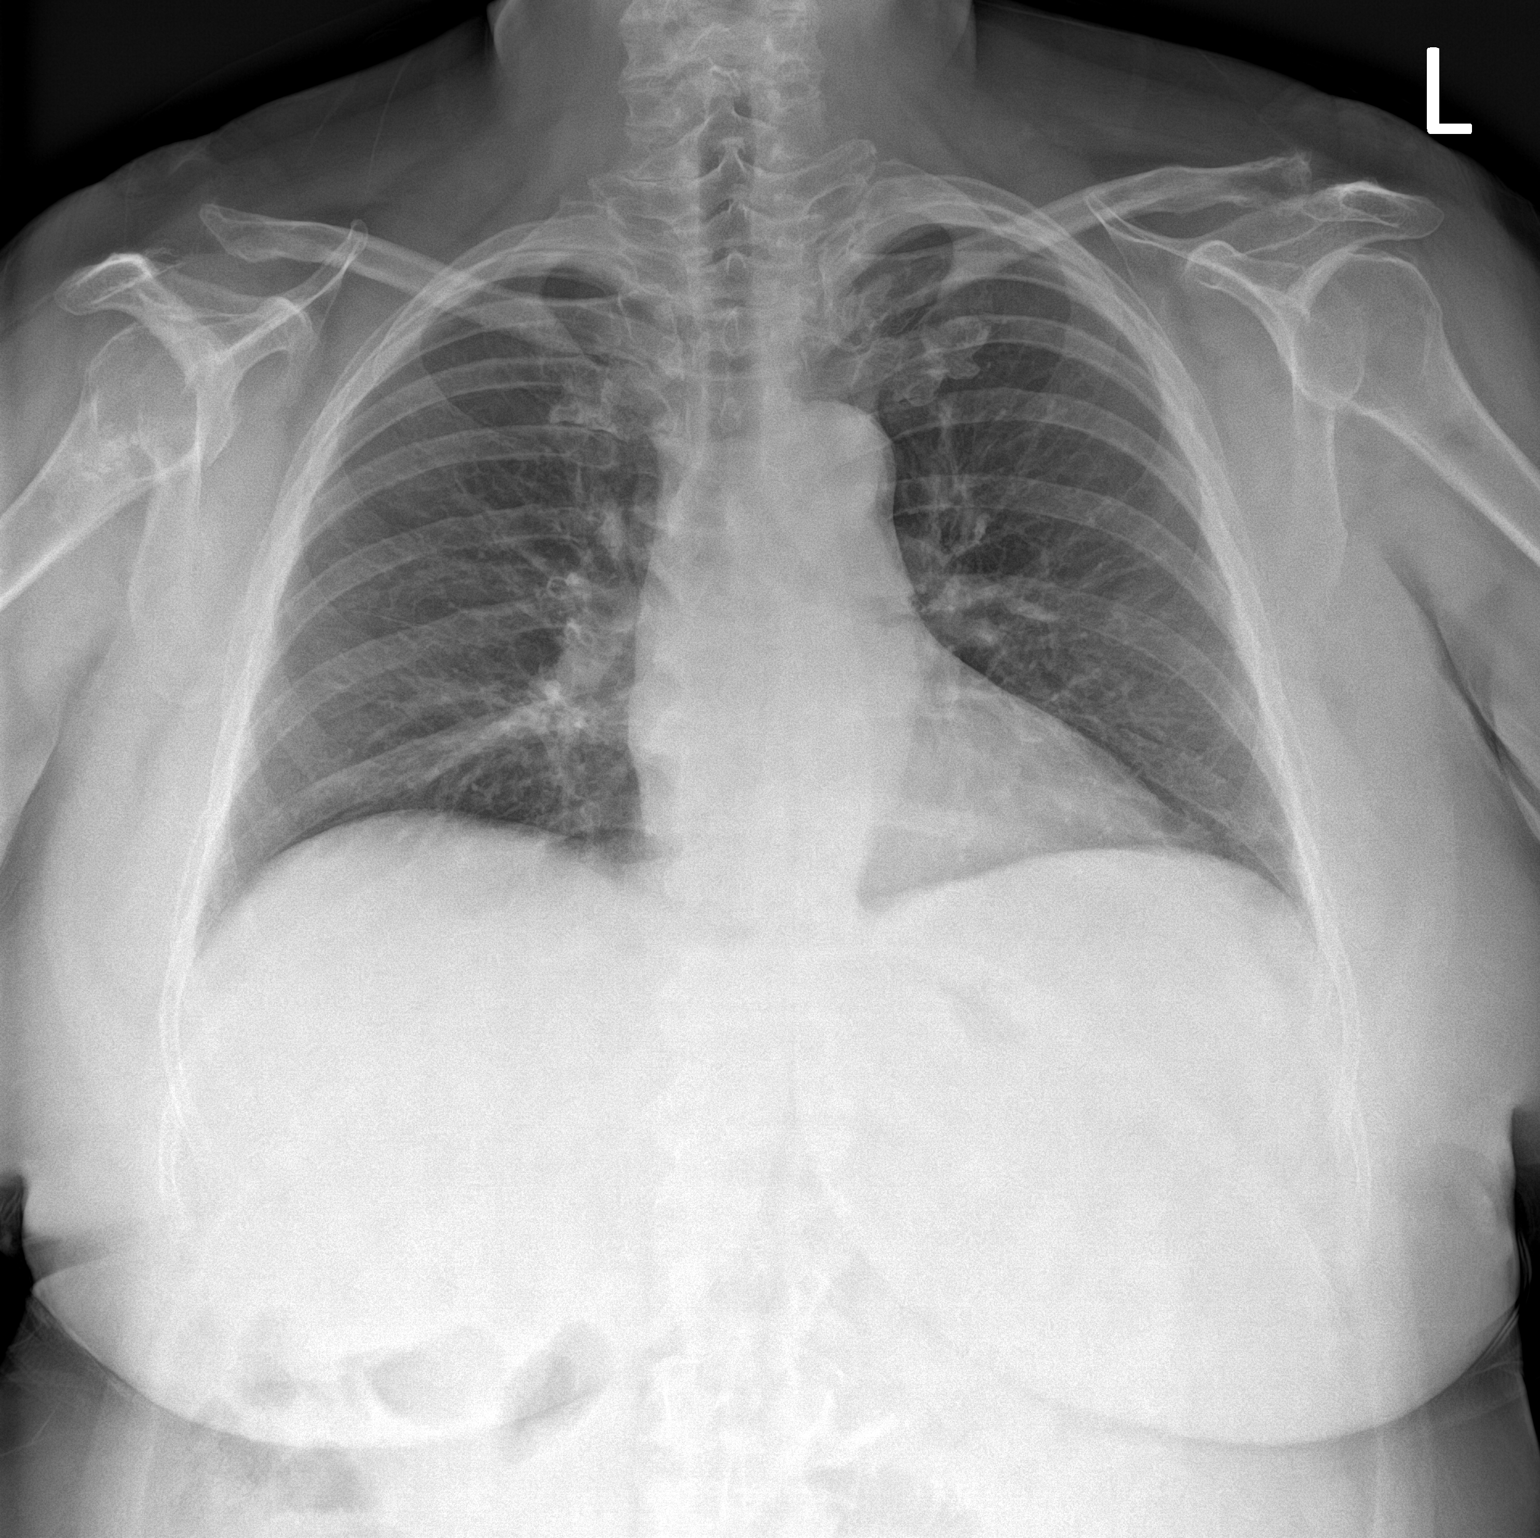

[chest lat]
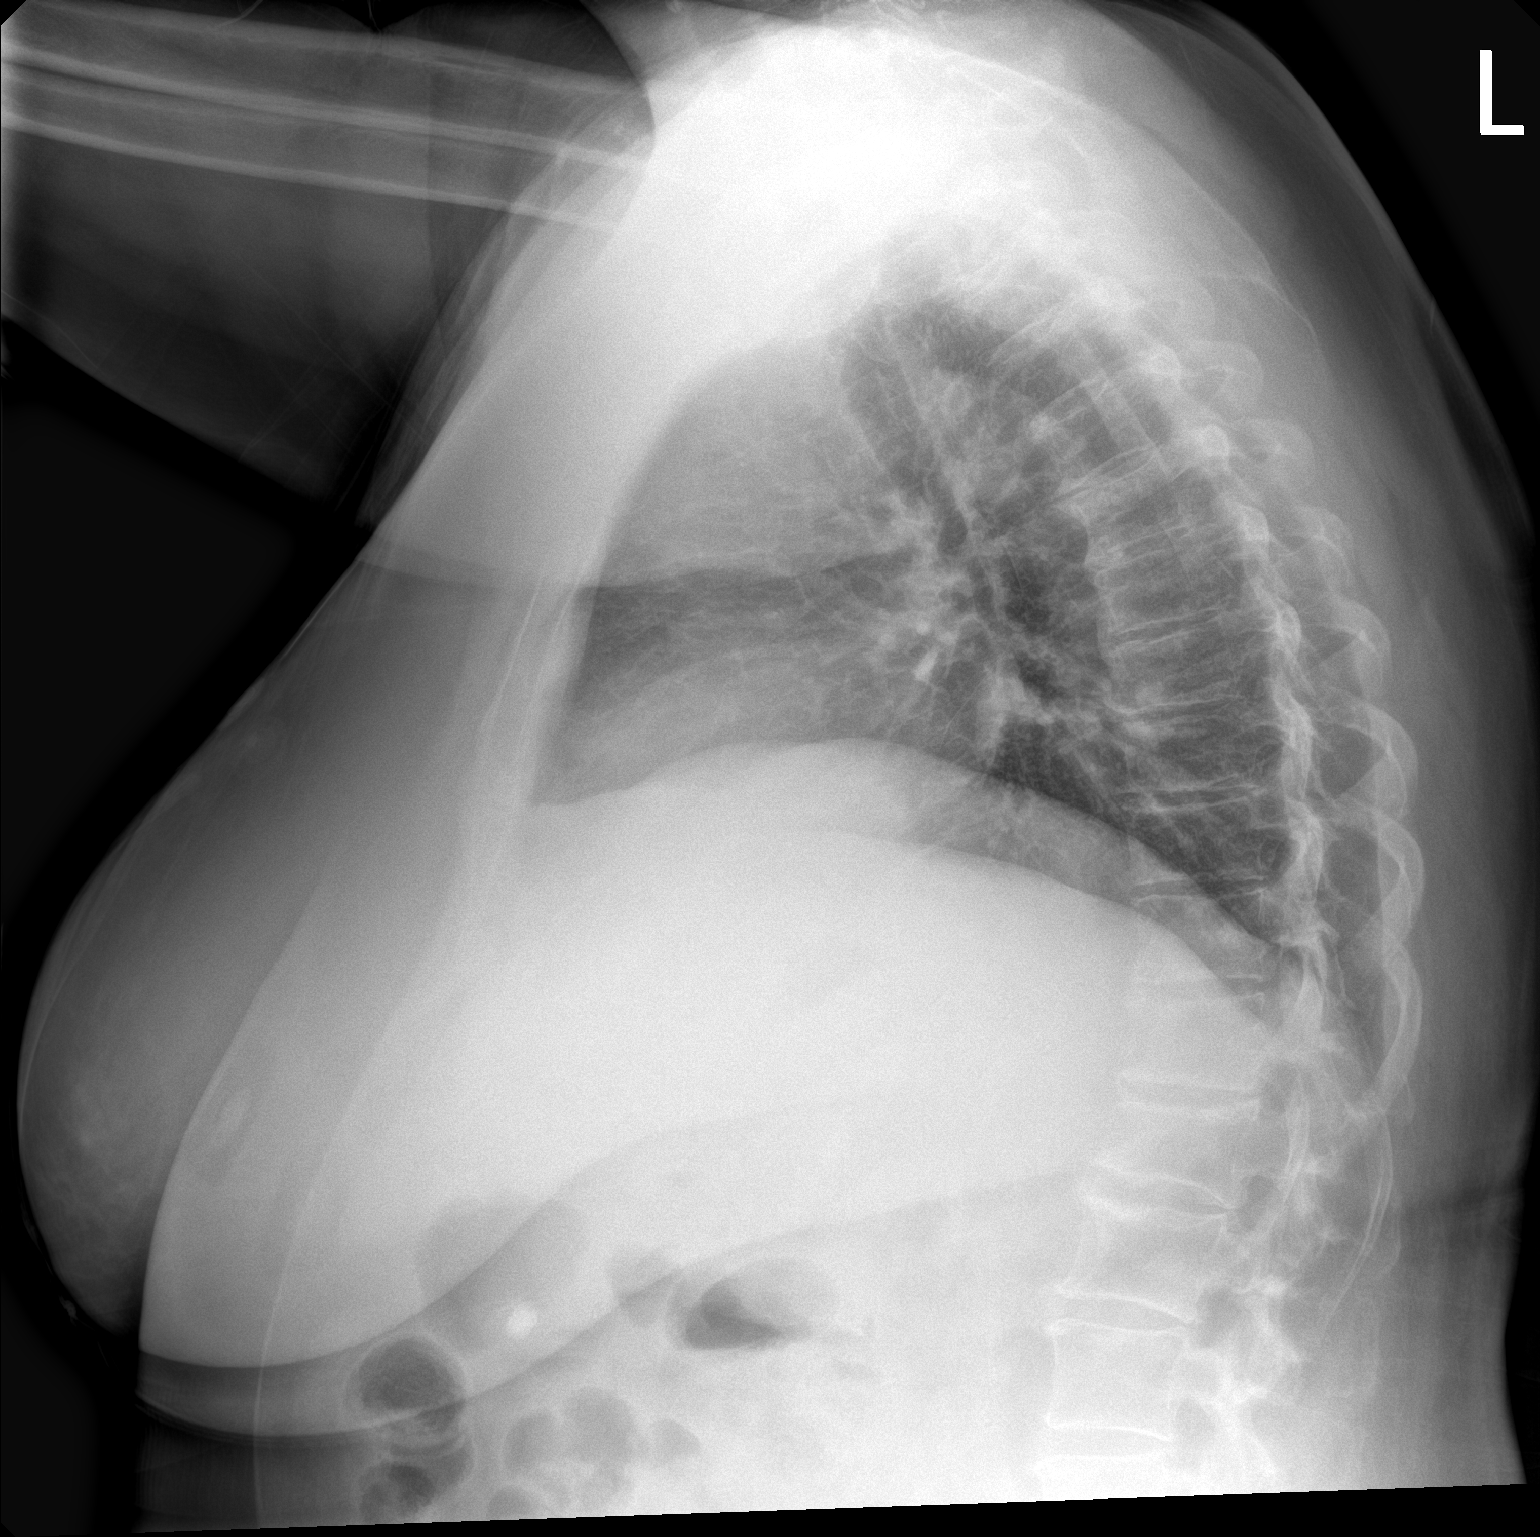

[2 of 2 positions shown; findings below may reference images not displayed]

FINDINGS: The heart size and mediastinal contours are within normal limits.
Both lungs are clear. Improved aeration in the lung bases.

Mild compression fracture T12 unchanged from prior lumbar CT
[DATE]
IMPRESSION: No active cardiopulmonary disease.

## 2022-03-28 IMAGING — CT CT HEAD W/O CM
4 series · 17 of 47 positions shown, 19 images · non-contrast
Comparison: None Available.

CLINICAL DATA: Headache.



[Series 2: head wo · axial · 0.42mm/px · z∈[+922,+1042]mm · 7 of 33 slices shown, 9 images]
[im 5/33  brain]
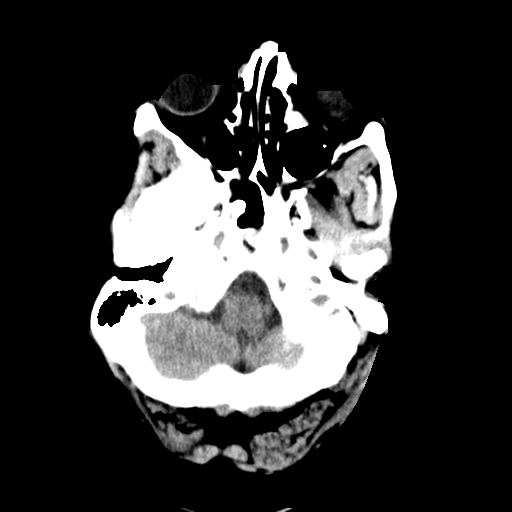
[im 5/33  bone]
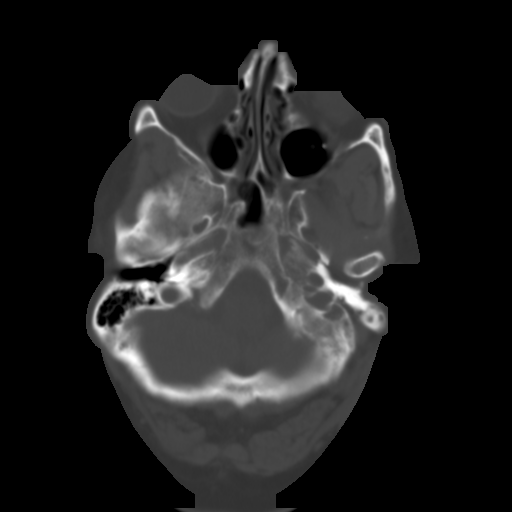
[im 9/33  brain]
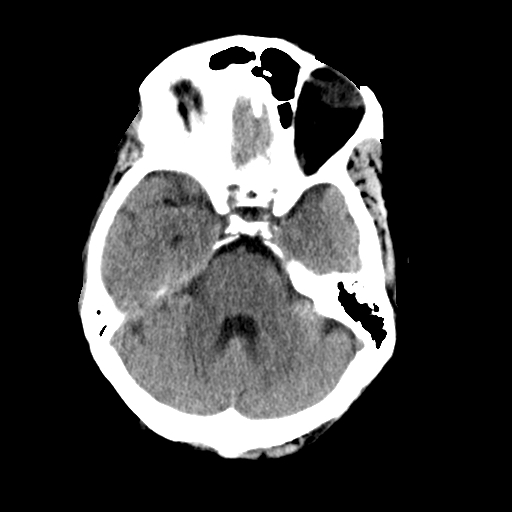
[im 13/33  brain]
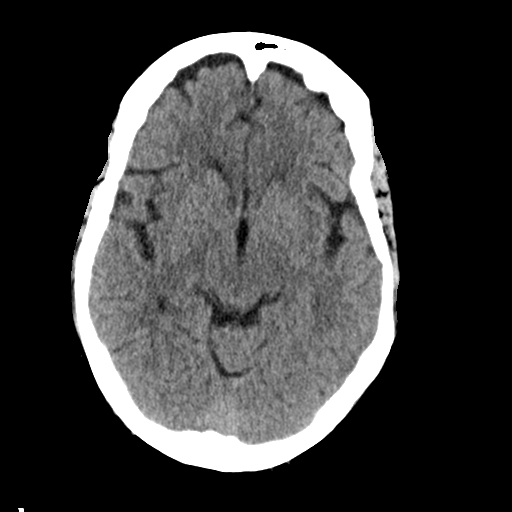
[im 17/33  brain]
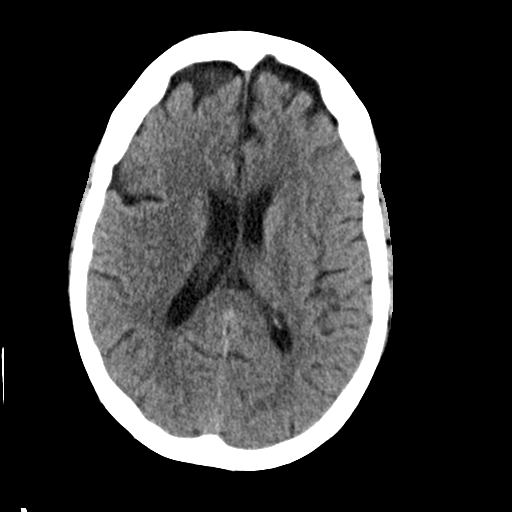
[im 21/33  brain]
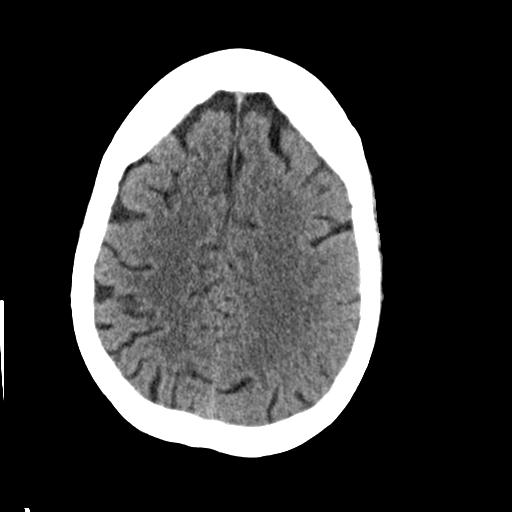
[im 21/33  bone]
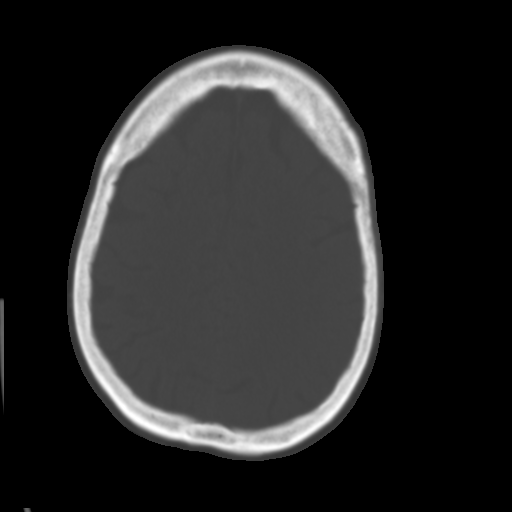
[im 25/33  brain]
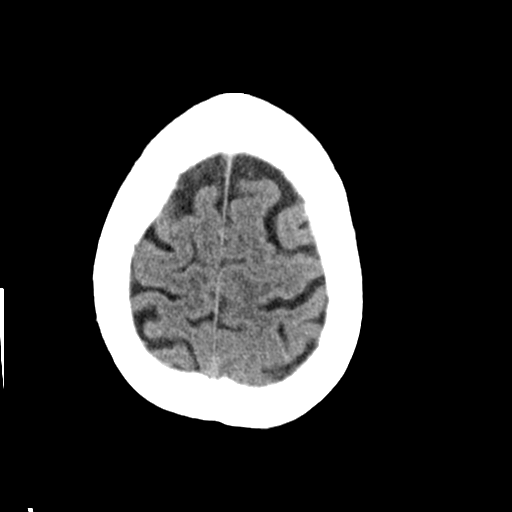
[im 29/33  brain]
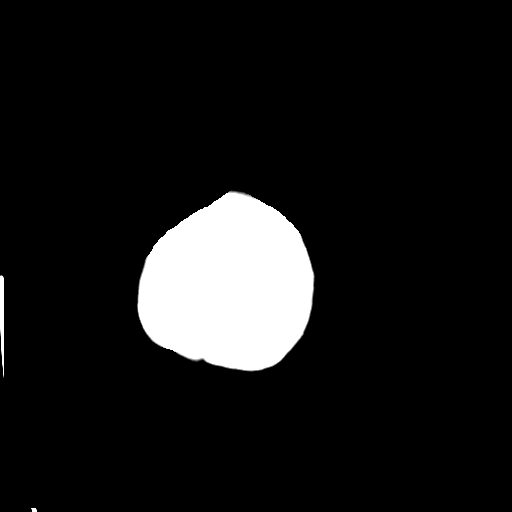

[Series 3: head bone · axial · 0.42mm/px · z∈[+918,+974]mm · 4 of 81 slices shown]
[im 9/81  bone]
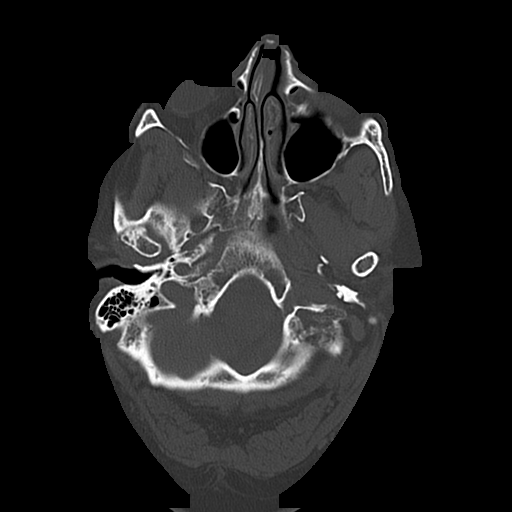
[im 17/81  bone]
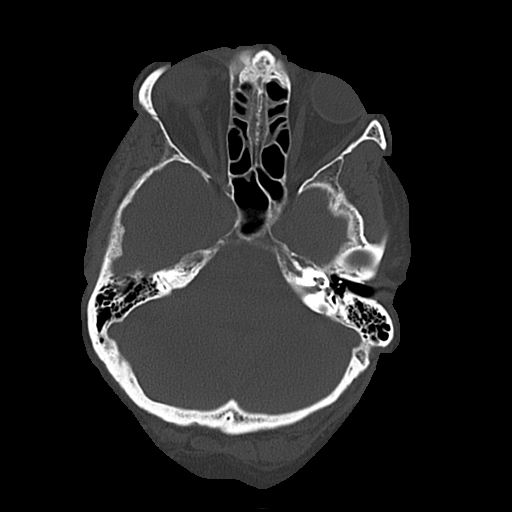
[im 25/81  bone]
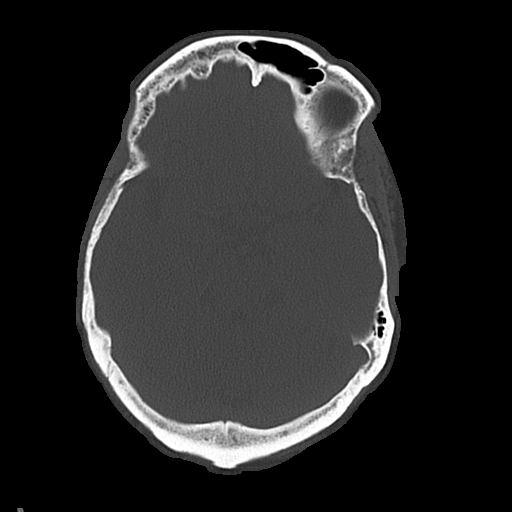
[im 37/81  bone]
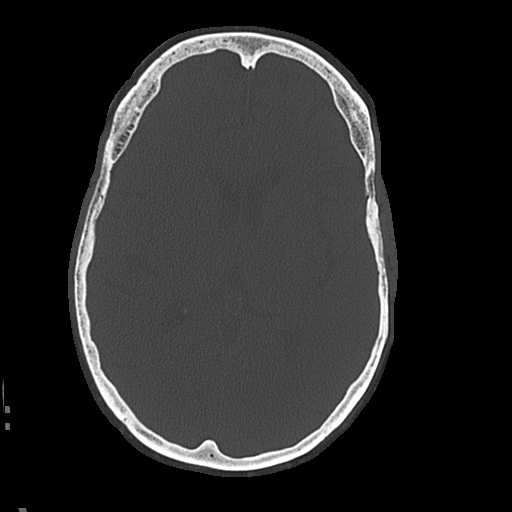

[Series 4: coronal soft · coronal · 0.31mm/px · 3 of 71 slices shown]
[im 24/71  brain]
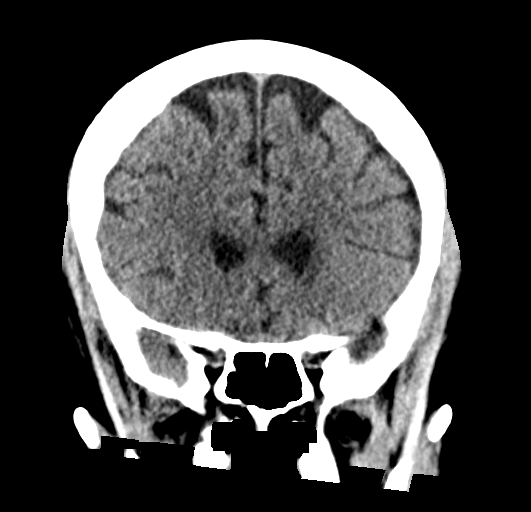
[im 32/71  brain]
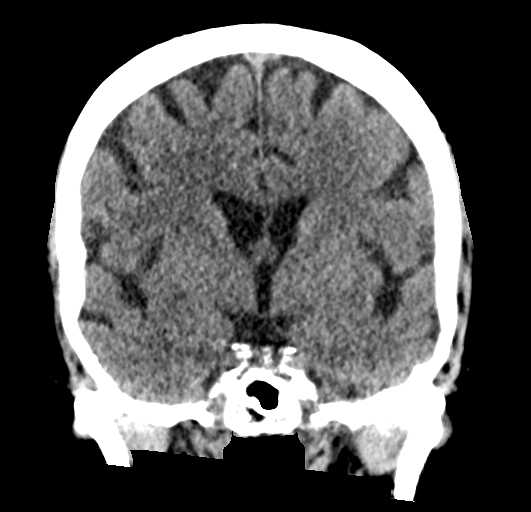
[im 39/71  brain]
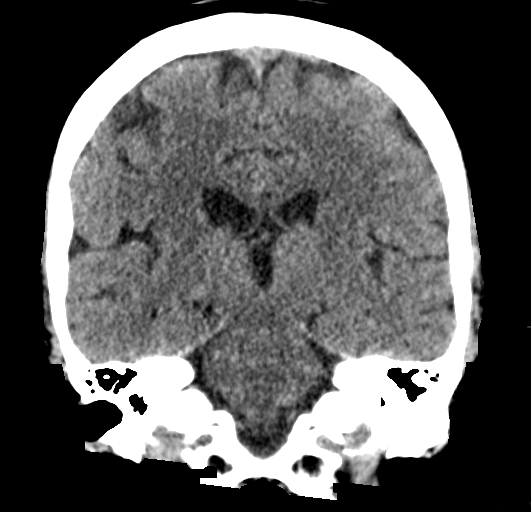

[Series 5: sagittal soft · sagittal · 0.34mm/px · 3 of 56 slices shown]
[im 19/56  brain]
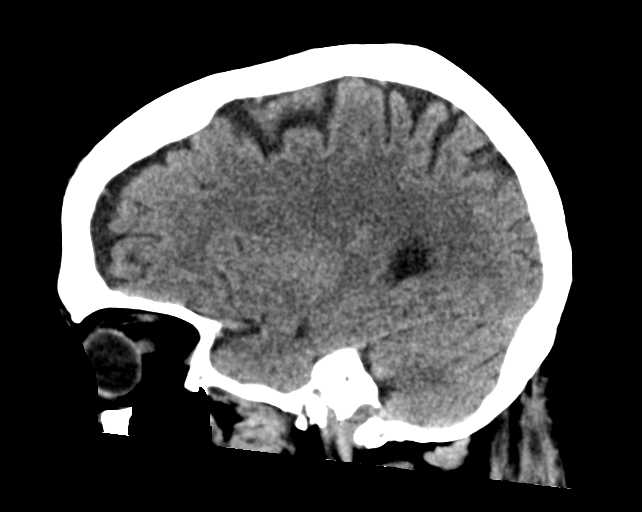
[im 28/56  brain]
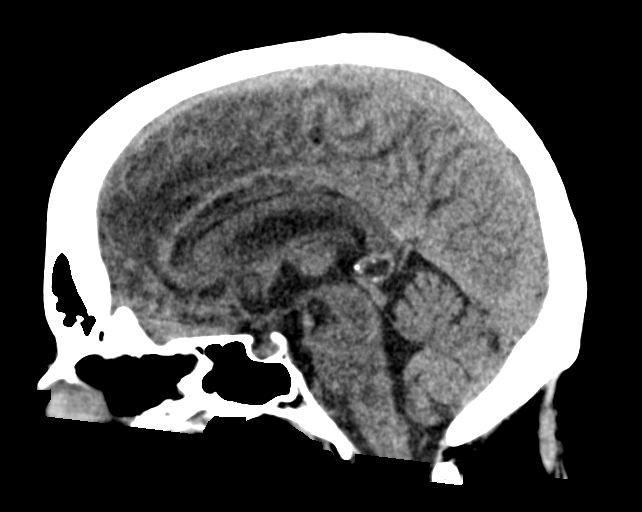
[im 37/56  brain]
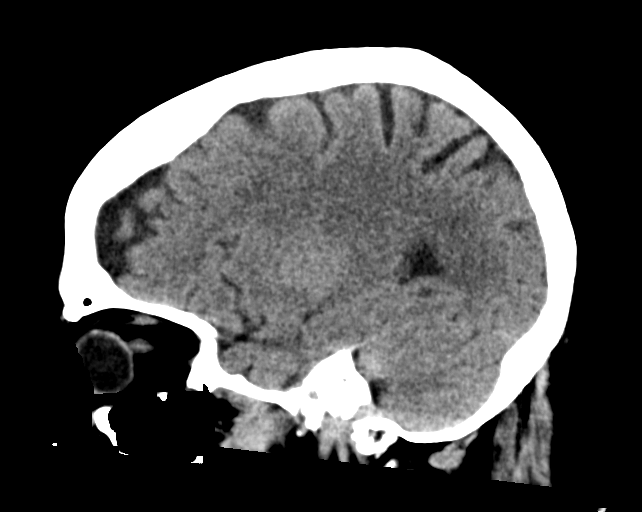

[17 of 47 positions shown; findings below may reference images not displayed]

FINDINGS: Brain: No evidence of acute infarction, hemorrhage, hydrocephalus,
extra-axial collection or mass lesion/mass effect.

Vascular: No hyperdense vessel or unexpected calcification.

Skull: Normal. Negative for fracture or focal lesion.

Sinuses/Orbits:

Other: Partial opacification of the right anterior ethmoid air
cells. The remaining paranasal sinuses and the mastoid air cells are
clear.
IMPRESSION: 1. No acute intracranial abnormalities.
2. Mild right ethmoid air cell inflammation.

## 2022-03-28 MED ORDER — ACETAMINOPHEN 325 MG PO TABS
650.0000 mg | ORAL_TABLET | Freq: Once | ORAL | Status: AC
  Administered 2022-03-28: 650 mg via ORAL
  Filled 2022-03-28: qty 2

## 2022-03-28 MED ORDER — DIPHENHYDRAMINE HCL 50 MG/ML IJ SOLN
25.0000 mg | Freq: Once | INTRAMUSCULAR | Status: AC
Start: 2022-03-28 — End: 2022-03-28
  Administered 2022-03-28: 25 mg via INTRAVENOUS
  Filled 2022-03-28: qty 1

## 2022-03-28 MED ORDER — KETOROLAC TROMETHAMINE 15 MG/ML IJ SOLN
15.0000 mg | Freq: Once | INTRAMUSCULAR | Status: AC
  Administered 2022-03-28: 15 mg via INTRAVENOUS
  Filled 2022-03-28: qty 1

## 2022-03-28 MED ORDER — IBUPROFEN 400 MG PO TABS
400.0000 mg | ORAL_TABLET | Freq: Once | ORAL | Status: AC | PRN
Start: 2022-03-28 — End: 2022-03-28
  Administered 2022-03-28: 400 mg via ORAL
  Filled 2022-03-28: qty 1

## 2022-03-28 MED ORDER — PROCHLORPERAZINE EDISYLATE 10 MG/2ML IJ SOLN
10.0000 mg | Freq: Once | INTRAMUSCULAR | Status: AC
  Administered 2022-03-28: 10 mg via INTRAVENOUS
  Filled 2022-03-28: qty 2

## 2022-03-28 MED ORDER — SODIUM CHLORIDE 0.9 % IV BOLUS
1000.0000 mL | Freq: Once | INTRAVENOUS | Status: AC
  Administered 2022-03-28: 1000 mL via INTRAVENOUS

## 2022-03-28 NOTE — ED Notes (Signed)
Patient transported to CT 

## 2022-03-28 NOTE — ED Triage Notes (Signed)
Pt started with COVID symptoms on 5/13 and was tested + after traveling to [place]. Pt presents today for a re-check because she has not improved. Pt reports she woke up with vivid dreams, SpO2 of 88% at home. Pt c/o generalized pain, HA, and sore throat. Pt on Paxlovid and diphenhydramine.

## 2022-03-28 NOTE — ED Notes (Signed)
ED Provider at bedside. 

## 2022-03-28 NOTE — ED Provider Notes (Signed)
[facility] EMERGENCY DEPT Provider Note   CSN: [id number] Arrival date & time: 03/28/22  1226     History  Chief Complaint  Patient presents with   Generalized Body Aches    Haley White is a 66 y.o. female with past medical history significant for hypertension, anxiety, obesity, depression, who is currently being worked up for rheumatologic condition that they are suspicious may be ankylosing spondylitis, GERD, asthma who presents with concern for ongoing headache, generalized pain, sore throat in context of recent COVID infection.  Patient reports that she began having COVID symptoms on 5/13, tested positive, was placed on Paxlovid, steroid taper and seen in our office for similar symptoms 5/19.  Patient reports that she has had some ongoing shortness of breath, weakness, malaise, myalgia, nausea especially with attempting to drink fluids, as well as splitting headache.  She does endorse a history of migraines.  She reports that she had some improvement of her headache after the last migraine cocktail but then it returned shortly thereafter.  She reports that she has not had any difficulty with eating.  She denies significant chest pain.  She denies any unilateral numbness, tingling.  She does endorse some brain fog, confusion.  She reports her SPO2 was 88% x 1 at home this morning.  HPI     Home Medications Prior to Admission medications   Medication Sig Start Date End Date Taking? Authorizing Provider  albuterol (VENTOLIN HFA) 108 (90 Base) MCG/ACT inhaler Inhale 2 puffs into the lungs every 4 (four) hours as needed for wheezing or shortness of breath. 07/17/17   Liane Comber, NP  Ascorbic Acid (VITAMIN C PO) Take 1 tablet by mouth.    [provider]  B Complex Vitamins (VITAMIN B-COMPLEX PO) Take 1 tablet by mouth daily.    [provider]  buPROPion (WELLBUTRIN XL) 300 MG 24 hr tablet Take 300 mg by mouth every morning. 12/17/20   [provider]  Calcium Carbonate (CALCIUM 600 PO) Take by mouth daily.    [provider]  celecoxib (CELEBREX) 100 MG capsule Take 1 capsule (100 mg total) by mouth 2 (two) times daily. 02/20/22   Trula Slade, DPM  dexamethasone (DECADRON) 4 MG tablet Take       1 tab 3 x day      for 2 days, then 2 x day for 2  days, then 1 tab daily 03/18/22   Darrol Jump, NP  diphenhydrAMINE (BENADRYL) 25 MG tablet Take 1 tablet (25 mg total) by mouth at bedtime as needed for up to 12 doses for sleep (Headache). Take with reglan as needed at bedtime to help with headache and sleep 03/22/22   Wyvonnia Dusky, MD  EPINEPHrine 0.3 mg/0.3 mL IJ SOAJ injection See admin instructions. 02/08/20   [provider]  fexofenadine (ALLEGRA) 180 MG tablet 1 tablet    [provider]  FLUoxetine (PROZAC) 20 MG capsule Take 1 capsule (20 mg total) by mouth daily. For depression 05/08/15   Lindell Spar I, NP  fluticasone (FLONASE) 50 MCG/ACT nasal spray SHAKE LQ AND U 1 TO 2 SPRAYS IEN QD 10/17/17   [provider]  gabapentin (NEURONTIN) 300 MG capsule Take 1 capsule (300 mg total) by mouth daily. 10/02/21   Trula Slade, DPM  levocetirizine (XYZAL) 5 MG tablet TK 1 T PO QD IN THE EVE 12/07/17   [provider]  lisinopril (ZESTRIL) 20 MG tablet TAKE 1 TABLET BY MOUTH DAILY  FOR BLOOD PRESSURE 12/01/21   Magda Bernheim, NP  Magnesium 400 MG TABS Take 1 tablet by mouth daily. Patient not taking: Reported on 03/18/2022    [provider]  methylphenidate (RITALIN) 10 MG tablet TK 1 T PO BID 01/16/18   [provider]  metoCLOPramide (REGLAN) 10 MG tablet Take 1 tablet (10 mg total) by mouth every 12 (twelve) hours as needed for up to 12 doses for nausea. 03/22/22   Wyvonnia Dusky, MD  montelukast (SINGULAIR) 10 MG tablet 1 tablet    [provider]  potassium chloride (KLOR-CON) 10 MEQ tablet Take 1 tablet (10 mEq total) by mouth daily for 15 days.  03/22/22 04/06/22  Wyvonnia Dusky, MD  promethazine-dextromethorphan (PROMETHAZINE-DM) 6.25-15 MG/5ML syrup Take 5 mL as needed at bedtime for severe cough 03/18/22   Darrol Jump, NP  rosuvastatin (CRESTOR) 40 MG tablet TAKE 1 TABLET BY MOUTH DAILY FOR CHOLESTEROL 11/01/21   Liane Comber, NP  SYMBICORT 160-4.5 MCG/ACT inhaler INL 2 PFS PO BID 01/16/18   [provider]  VITAMIN D PO Take 15,000 Units by mouth daily.    [provider]      Allergies    Molds & smuts, Biaxin [clarithromycin], Serevent [salmeterol], and Tequin [gatifloxacin]    Review of Systems   Review of Systems  Constitutional:  Positive for chills.  Respiratory:  Positive for shortness of breath.   Gastrointestinal:  Positive for nausea.  Musculoskeletal:  Positive for myalgias.  Neurological:  Positive for headaches.  All other systems reviewed and are negative.  Physical Exam Updated Vital Signs BP 130/78   Pulse 62   Temp 98.7 F (37.1 C) (Oral)   Resp 19   Ht '5\' 3"'$  (1.6 m)   Wt 95.3 kg   LMP 11/26/2011   SpO2 95%   BMI 37.20 kg/m  Physical Exam Vitals and nursing note reviewed.  Constitutional:      General: She is not in acute distress.    Appearance: Normal appearance.     Comments: Patient is somewhat ill-appearing, and in minimal distress.   HENT:     Head: Normocephalic and atraumatic.     Mouth/Throat:     Comments: Dry mucous membranes, no significant posterior oropharynx erythema, tonsils 1+ bilaterally, no exudate, uvula midline.  Floor of mouth without significant swelling, redness.  No significant cervical adenopathy noted. Eyes:     General:        Right eye: No discharge.        Left eye: No discharge.  Cardiovascular:     Rate and Rhythm: Normal rate and regular rhythm.     Heart sounds: No murmur heard.   No friction rub. No gallop.  Pulmonary:     Effort: Pulmonary effort is normal. No respiratory distress.     Breath sounds: Normal breath sounds.      Comments: No respiratory distress, no significant wheezing, rhonchi, rales some very diffuse end expiratory wheezing in lower lung fields.  No focal consolidation noted. Abdominal:     General: Bowel sounds are normal.     Palpations: Abdomen is soft.  Skin:    General: Skin is warm and dry.     Capillary Refill: Capillary refill takes less than 2 seconds.  Neurological:     Mental Status: She is alert and oriented to person, place, and time.  Psychiatric:        Mood and Affect: Mood normal.  Behavior: Behavior normal.    ED Results / Procedures / Treatments   Labs (all labs ordered are listed, but only abnormal results are displayed) Labs Reviewed  CBC - Abnormal; Notable for the following components:      Result Value   WBC 17.6 (*)    All other components within normal limits  BASIC METABOLIC PANEL - Abnormal; Notable for the following components:   Calcium 8.1 (*)    All other components within normal limits    EKG EKG Interpretation  Date/Time:  Thursday Mar 28 2022 12:42:47 EDT Ventricular Rate:  91 PR Interval:  148 QRS Duration: 72 QT Interval:  358 QTC Calculation: 440 R Axis:   -17 Text Interpretation: Normal sinus rhythm Inferior infarct , age undetermined Anterolateral infarct (cited on or before 28-Mar-2022) Abnormal ECG When compared with ECG of 22-Mar-2022 14:54, Inferior infarct is now Present Questionable change in initial forces of Lateral leads Inverted T waves have replaced nonspecific T wave abnormality in Lateral leads Confirmed by Varney Biles [phone]) on 03/28/2022 1:56:03 PM  Radiology DG Chest 2 View  Result Date: 03/28/2022 CLINICAL DATA:  Short of breath EXAM: CHEST - 2 VIEW COMPARISON:  03/22/2022 FINDINGS: The heart size and mediastinal contours are within normal limits. Both lungs are clear. Improved aeration in the lung bases. Mild compression fracture T12 unchanged from prior lumbar CT 03/07/2019 IMPRESSION: No active cardiopulmonary  disease. Electronically Signed   By: Franchot Gallo M.D.   On: 03/28/2022 13:15   CT Head Wo Contrast  Result Date: 03/28/2022 CLINICAL DATA:  Headache. EXAM: CT HEAD WITHOUT CONTRAST TECHNIQUE: Contiguous axial images were obtained from the base of the skull through the vertex without intravenous contrast. RADIATION DOSE REDUCTION: This exam was performed according to the departmental dose-optimization program which includes automated exposure control, adjustment of the mA and/or kV according to patient size and/or use of iterative reconstruction technique. COMPARISON:  None Available. FINDINGS: Brain: No evidence of acute infarction, hemorrhage, hydrocephalus, extra-axial collection or mass lesion/mass effect. Vascular: No hyperdense vessel or unexpected calcification. Skull: Normal. Negative for fracture or focal lesion. Sinuses/Orbits: Other: Partial opacification of the right anterior ethmoid air cells. The remaining paranasal sinuses and the mastoid air cells are clear. IMPRESSION: 1. No acute intracranial abnormalities. 2. Mild right ethmoid air cell inflammation. Electronically Signed   By: Kerby Moors M.D.   On: 03/28/2022 14:29    Procedures Procedures    Medications Ordered in ED Medications  ibuprofen (ADVIL) tablet 400 mg (400 mg Oral Given 03/28/22 1246)  acetaminophen (TYLENOL) tablet 650 mg (650 mg Oral Given 03/28/22 1444)  prochlorperazine (COMPAZINE) injection 10 mg (10 mg Intravenous Given 03/28/22 1408)  diphenhydrAMINE (BENADRYL) injection 25 mg (25 mg Intravenous Given 03/28/22 1403)  ketorolac (TORADOL) 15 MG/ML injection 15 mg (15 mg Intravenous Given 03/28/22 1407)  sodium chloride 0.9 % bolus 1,000 mL (0 mLs Intravenous Stopped 03/28/22 1550)    ED Course/ Medical Decision Making/ A&P                           Medical Decision Making Amount and/or Complexity of Data Reviewed Radiology: ordered.  Risk Prescription drug management.   This patient is a 66 y.o.  female who presents to the ED for concern of headache, body aches, sore throat, malaise without significant shortness of breath, chest pain, this involves an extensive number of treatment options, and is a complaint that carries with it a high risk  of complications and morbidity. The emergent differential diagnosis prior to evaluation includes, but is not limited to, ongoing symptoms related to patient's recent COVID infection, new PE, pneumonia, dehydration, intracranial abnormality including stroke, hemorrhage, dural venous thrombosis, versus simple migraine related to dehydration versus other.   This is not an exhaustive differential.   Past Medical History / Co-morbidities / Social History: Hypertension, anxiety, obesity, hyperlipidemia  Additional history: Chart reviewed. Pertinent results include: Reviewed lab work, imaging from recent emergency department visit, outpatient PCP evaluations  Physical Exam: Physical exam performed. The pertinent findings include: Patient with no focal neurologic deficits on my exam.  She has no wheezing bilaterally, or focal consolidation noted.  She does not appear to be in any respiratory distress.  She endorses some generalized malaise, myalgias.  No focal tenderness of bony prominences.  No meningismus, normal range of motion of neck.  Lab Tests: I ordered, and personally interpreted labs.  The pertinent results include: Patient with leukocytosis, white blood cell 17.6, patient recently finished steroid course, and is endorsing some significant pain, and low clinical suspicion for sepsis or occult bacterial infection.  BMP is unremarkable at this time.   Imaging Studies: I ordered imaging studies including plain film chest x-ray, CT head without contrast. I independently visualized and interpreted imaging which showed no evidence of intrathoracic or intracranial abnormality. I agree with the radiologist interpretation.   Cardiac Monitoring:  The patient  was maintained on a cardiac monitor.  My attending physician Dr. Alvino Chapel viewed and interpreted the cardiac monitored which showed an underlying rhythm of: Normal sinus rhythm, nonspecific T wave changes, possible inferior ischemia which is not acute noted.  Patient with no evidence of chest pain, no significant report of shortness of breath on her exam today.   Medications: I ordered medication including migraine cocktail for headache, myalgia. Reevaluation of the patient after these medicines showed that the patient improved. I have reviewed the patients home medicines and have made adjustments as needed.   Disposition: After consideration of the diagnostic results and the patients response to treatment, I feel that patient's presentation is clinically consistent with mild dehydration, and sequelae from her recent COVID infection.  Based on normal oxygen saturation on room air, no complaint of chest pain, shortness of breath I have low clinical suspicion for PE, atypical ACS on her presentation today.  She had a headache which was consistent with a migraine headache which was not intractable, resolved after migraine cocktail.  She does not meet criteria for inpatient admission at this time.  Encouraged supportive care, and extensive return precautions given.  Patient discharged in stable condition at this time.  I discussed this case with my attending physician Dr. Alvino Chapel who cosigned this note including patient's presenting symptoms, physical exam, and planned diagnostics and interventions. Attending physician stated agreement with plan or made changes to plan which were implemented.    Final Clinical Impression(s) / ED Diagnoses Final diagnoses:  Other migraine without status migrainosus, not intractable  Myalgia due to viral infection  Other fatigue    Rx / DC Orders ED Discharge Orders     None         Dorien Chihuahua 03/28/22 [place], Ankit, MD 03/29/22  [phone]

## 2022-03-28 NOTE — Discharge Instructions (Addendum)
Please use Tylenol or ibuprofen for pain.  You may use 600 mg ibuprofen every 6 hours or 1000 mg of Tylenol every 6 hours.  You may choose to alternate between the 2.  This would be most effective.  Not to exceed 4 g of Tylenol within 24 hours.  Not to exceed 3200 mg ibuprofen 24 hours.  Drink plenty of fluids including Pedialyte, Gatorade, water.  I recommend at least 50 ounces a day.  Please follow-up sometime next week with your primary care to ensure that your symptoms are improving.  At this time I do not see any evidence of other abnormalities other than general bad feeling, lingering body aches, headache related to your recent COVID infection.  If your symptoms continue to worsen despite time, rest recommend you return to the emergency department for further evaluation.

## 2022-03-28 NOTE — ED Notes (Signed)
RN provided AVS using Teachback Method. Patient verbalizes understanding of Discharge Instructions. Opportunity for Questioning and Answers were provided by RN. Patient Discharged from ED in Wheelchair to Home with family.

## 2022-03-28 NOTE — ED Notes (Signed)
Patient ambulatory to Restroom with Minimal Assistance from this RN and Family Visitor.

## 2022-03-30 ENCOUNTER — Other Ambulatory Visit: Payer: Self-pay | Admitting: Internal Medicine

## 2022-03-30 DIAGNOSIS — B028 Zoster with other complications: Secondary | ICD-10-CM

## 2022-03-30 MED ORDER — GABAPENTIN 300 MG PO CAPS: ORAL_CAPSULE | ORAL | 0 refills | Status: DC

## 2022-03-30 MED ORDER — VALACYCLOVIR HCL 1 G PO TABS: ORAL_TABLET | ORAL | 0 refills | Status: DC

## 2022-03-30 MED ORDER — DEXAMETHASONE 4 MG PO TABS: ORAL_TABLET | ORAL | 0 refills | Status: DC

## 2022-03-31 ENCOUNTER — Encounter: Payer: Self-pay | Admitting: Internal Medicine

## 2022-03-31 DIAGNOSIS — R69 Illness, unspecified: Secondary | ICD-10-CM | POA: Diagnosis not present

## 2022-03-31 DIAGNOSIS — F411 Generalized anxiety disorder: Secondary | ICD-10-CM | POA: Diagnosis not present

## 2022-03-31 DIAGNOSIS — Z62820 Parent-biological child conflict: Secondary | ICD-10-CM | POA: Diagnosis not present

## 2022-03-31 DIAGNOSIS — F331 Major depressive disorder, recurrent, moderate: Secondary | ICD-10-CM | POA: Diagnosis not present

## 2022-04-02 ENCOUNTER — Other Ambulatory Visit: Payer: Self-pay | Admitting: Internal Medicine

## 2022-04-02 ENCOUNTER — Encounter: Payer: Self-pay | Admitting: Podiatry

## 2022-04-02 MED ORDER — FAMOTIDINE 40 MG PO TABS: ORAL_TABLET | ORAL | 1 refills | Status: DC

## 2022-04-02 MED ORDER — PANTOPRAZOLE SODIUM 40 MG PO TBEC: DELAYED_RELEASE_TABLET | ORAL | 1 refills | Status: DC

## 2022-04-03 NOTE — Telephone Encounter (Signed)
Can you follow up on the rheumatology referral for [facility] on [address] with Dr. Gavin Pound? Thanks.

## 2022-04-07 ENCOUNTER — Other Ambulatory Visit: Payer: Self-pay | Admitting: Internal Medicine

## 2022-04-07 ENCOUNTER — Encounter: Payer: Self-pay | Admitting: Internal Medicine

## 2022-04-07 DIAGNOSIS — F331 Major depressive disorder, recurrent, moderate: Secondary | ICD-10-CM | POA: Diagnosis not present

## 2022-04-07 DIAGNOSIS — R69 Illness, unspecified: Secondary | ICD-10-CM | POA: Diagnosis not present

## 2022-04-07 DIAGNOSIS — Z62820 Parent-biological child conflict: Secondary | ICD-10-CM | POA: Diagnosis not present

## 2022-04-07 DIAGNOSIS — F411 Generalized anxiety disorder: Secondary | ICD-10-CM | POA: Diagnosis not present

## 2022-04-07 MED ORDER — NYSTATIN 100000 UNIT/ML MT SUSP: OROMUCOSAL | 3 refills | Status: DC

## 2022-04-09 ENCOUNTER — Ambulatory Visit: Payer: Medicare HMO | Admitting: Podiatry

## 2022-04-09 DIAGNOSIS — M7672 Peroneal tendinitis, left leg: Secondary | ICD-10-CM | POA: Diagnosis not present

## 2022-04-09 NOTE — Progress Notes (Unsigned)
Future Appointments  Date Time Provider Department  04/10/2022  9:30 AM Unk Pinto, MD [facility]  04/18/2022 10:30 AM Darrol Jump, NP [facility]  06/04/2022  9:15 AM Trula Slade, DPM [facility]  08/02/2022 10:30 AM Unk Pinto, MD [facility]  01/22/2023 10:00 AM Unk Pinto, MD [facility]    History of Present Illness:     Patient is a very nice 66 yo divWF  with  for HTN, HLD, Prediabetes  and Vitamin D Deficiency and had  hx/o recent Covid infection ~ May 12 and     Medications  Current Outpatient Medications (Endocrine & Metabolic):    dexamethasone (DECADRON) 4 MG tablet, Take 1 tab 3 x day - 3 days, then 2 x day - 3 days, then 1 tab daily   magic mouthwash (nystatin, hydrocortisone, diphenhydrAMINE) suspension*, Take  1 teaspoon (5 ml)   Swish & Swallow  every 2 hours for mouth ulcers  Current Outpatient Medications (Cardiovascular):    EPINEPHrine 0.3 mg/0.3 mL IJ SOAJ injection, See admin instructions.   lisinopril (ZESTRIL) 20 MG tablet, TAKE 1 TABLET BY MOUTH DAILY FOR BLOOD PRESSURE   rosuvastatin (CRESTOR) 40 MG tablet, TAKE 1 TABLET BY MOUTH DAILY FOR CHOLESTEROL  Current Outpatient Medications (Respiratory):    albuterol (VENTOLIN HFA) 108 (90 Base) MCG/ACT inhaler, Inhale 2 puffs into the lungs every 4 (four) hours as needed for wheezing or shortness of breath.   diphenhydrAMINE (BENADRYL) 25 MG tablet, Take 1 tablet (25 mg total) by mouth at bedtime as needed for up to 12 doses for sleep (Headache). Take with reglan as needed at bedtime to help with headache and sleep   fexofenadine (ALLEGRA) 180 MG tablet, 1 tablet   fluticasone (FLONASE) 50 MCG/ACT nasal spray, SHAKE LQ AND U 1 TO 2 SPRAYS IEN QD   levocetirizine (XYZAL) 5 MG tablet, TK 1 T PO QD IN THE EVE   magic mouthwash (nystatin, hydrocortisone, diphenhydrAMINE) suspension*, Take  1 teaspoon (5 ml)   Swish & Swallow  every 2 hours for mouth ulcers   montelukast (SINGULAIR) 10 MG tablet,  1 tablet   promethazine-dextromethorphan (PROMETHAZINE-DM) 6.25-15 MG/5ML syrup, Take 5 mL as needed at bedtime for severe cough   SYMBICORT 160-4.5 MCG/ACT inhaler, INL 2 PFS PO BID  Current Outpatient Medications (Analgesics):    celecoxib (CELEBREX) 100 MG capsule, Take 1 capsule (100 mg total) by mouth 2 (two) times daily.   Current Outpatient Medications (Other):    Ascorbic Acid (VITAMIN C PO), Take 1 tablet by mouth.   B Complex Vitamins (VITAMIN B-COMPLEX PO), Take 1 tablet by mouth daily.   buPROPion (WELLBUTRIN XL) 300 MG 24 hr tablet, Take 300 mg by mouth every morning.   Calcium Carbonate (CALCIUM 600 PO), Take by mouth daily.   famotidine (PEPCID) 40 MG tablet, Take  1 tablet  at Night to Prevent Heartburn & Acid Reflux   FLUoxetine (PROZAC) 20 MG capsule, Take 1 capsule (20 mg total) by mouth daily. For depression   gabapentin (NEURONTIN) 300 MG capsule, Take 1 capsule  3 x /day  for Shingles Neuritis   magic mouthwash (nystatin, hydrocortisone, diphenhydrAMINE) suspension*, Take  1 teaspoon (5 ml)   Swish & Swallow  every 2 hours for mouth ulcers   methylphenidate (RITALIN) 10 MG tablet, TK 1 T PO BID   metoCLOPramide (REGLAN) 10 MG tablet, Take 1 tablet (10 mg total) by mouth every 12 (twelve) hours as needed for up to 12 doses for nausea.   pantoprazole (  PROTONIX) 40 MG tablet, Take  1 tablet  every Morning to Prevent Heartburn & Acid Reflux   potassium chloride (KLOR-CON) 10 MEQ tablet, Take 1 tablet (10 mEq total) by mouth daily for 15 days.   valACYclovir (VALTREX) 1000 MG tablet, Take 1 tablet 3 x day for Shingles   VITAMIN D PO, Take 15,000 Units by mouth daily. * These medications belong to multiple therapeutic classes and are listed under each applicable group.  Problem list She has Hypertension; Anxiety; Abnormal glucose; Class 2 severe obesity due to excess calories with serious comorbidity and body mass index (BMI) of 37.0 to 37.9 in adult [facility]); Medication  management; Vitamin D deficiency; Hyperlipidemia, mixed; Suicide attempt by drug ingestion ([place]); Steroid-induced depression; Major depressive disorder, recurrent, severe without psychotic features ([place]); Pseudogout; Asthma; Closed fracture of distal end of right radius; Cubital tunnel syndrome; Low back pain; Lumbosacral spondylosis without myelopathy; Aortic atherosclerosis ([place]) by Chest CT scan on 02/02/2018 ; Allergic rhinitis; Allergic rhinitis due to animal (cat) (dog) hair and dander; Allergic rhinitis due to pollen; Gastro-esophageal reflux disease without esophagitis; and Mild persistent asthma, uncomplicated on their problem list.   Observations/Objective:  LMP 11/26/2011   HEENT - WNL. Neck - supple.  Chest - Clear equal BS. Cor - Nl HS. RRR w/o sig MGR. PP 1(+). No edema. MS- FROM w/o deformities.  Gait Nl. Neuro -  Nl w/o focal abnormalities.   Assessment and Plan:      Follow Up Instructions:        I discussed the assessment and treatment plan with the patient. The patient was provided an opportunity to ask questions and all were answered. The patient agreed with the plan and demonstrated an understanding of the instructions.       The patient was advised to call back or seek an in-person evaluation if the symptoms worsen or if the condition fails to improve as anticipated.    Kirtland Bouchard, MD

## 2022-04-10 ENCOUNTER — Encounter: Payer: Self-pay | Admitting: Internal Medicine

## 2022-04-10 ENCOUNTER — Ambulatory Visit (INDEPENDENT_AMBULATORY_CARE_PROVIDER_SITE_OTHER): Payer: Medicare HMO | Admitting: Internal Medicine

## 2022-04-10 VITALS — BP 138/80 | Temp 96.9°F | Ht 63.0 in | Wt 211.4 lb

## 2022-04-10 DIAGNOSIS — B370 Candidal stomatitis: Secondary | ICD-10-CM | POA: Diagnosis not present

## 2022-04-10 MED ORDER — FLUCONAZOLE 100 MG PO TABS: ORAL_TABLET | ORAL | 0 refills | Status: DC

## 2022-04-14 ENCOUNTER — Emergency Department (HOSPITAL_BASED_OUTPATIENT_CLINIC_OR_DEPARTMENT_OTHER): Payer: Medicare HMO | Admitting: Radiology

## 2022-04-14 ENCOUNTER — Emergency Department (HOSPITAL_BASED_OUTPATIENT_CLINIC_OR_DEPARTMENT_OTHER)
Admission: EM | Admit: 2022-04-14 | Discharge: 2022-04-14 | Disposition: A | Discharge status: Home / Self Care | Payer: Medicare HMO | Attending: Emergency Medicine | Admitting: Emergency Medicine

## 2022-04-14 ENCOUNTER — Emergency Department (HOSPITAL_BASED_OUTPATIENT_CLINIC_OR_DEPARTMENT_OTHER): Payer: Medicare HMO

## 2022-04-14 ENCOUNTER — Encounter (HOSPITAL_BASED_OUTPATIENT_CLINIC_OR_DEPARTMENT_OTHER): Payer: Self-pay | Admitting: Obstetrics and Gynecology

## 2022-04-14 ENCOUNTER — Other Ambulatory Visit: Payer: Self-pay

## 2022-04-14 DIAGNOSIS — U071 COVID-19: Secondary | ICD-10-CM | POA: Diagnosis not present

## 2022-04-14 DIAGNOSIS — Z79899 Other long term (current) drug therapy: Secondary | ICD-10-CM | POA: Insufficient documentation

## 2022-04-14 DIAGNOSIS — Z7951 Long term (current) use of inhaled steroids: Secondary | ICD-10-CM | POA: Diagnosis not present

## 2022-04-14 DIAGNOSIS — J189 Pneumonia, unspecified organism: Secondary | ICD-10-CM | POA: Insufficient documentation

## 2022-04-14 DIAGNOSIS — R7989 Other specified abnormal findings of blood chemistry: Secondary | ICD-10-CM | POA: Diagnosis not present

## 2022-04-14 DIAGNOSIS — F331 Major depressive disorder, recurrent, moderate: Secondary | ICD-10-CM | POA: Diagnosis not present

## 2022-04-14 DIAGNOSIS — J1282 Pneumonia due to coronavirus disease 2019: Secondary | ICD-10-CM | POA: Diagnosis not present

## 2022-04-14 DIAGNOSIS — R0602 Shortness of breath: Secondary | ICD-10-CM | POA: Diagnosis not present

## 2022-04-14 DIAGNOSIS — R69 Illness, unspecified: Secondary | ICD-10-CM | POA: Diagnosis not present

## 2022-04-14 DIAGNOSIS — B028 Zoster with other complications: Secondary | ICD-10-CM

## 2022-04-14 DIAGNOSIS — J45909 Unspecified asthma, uncomplicated: Secondary | ICD-10-CM | POA: Diagnosis not present

## 2022-04-14 DIAGNOSIS — I10 Essential (primary) hypertension: Secondary | ICD-10-CM | POA: Diagnosis not present

## 2022-04-14 DIAGNOSIS — F411 Generalized anxiety disorder: Secondary | ICD-10-CM | POA: Diagnosis not present

## 2022-04-14 DIAGNOSIS — R0600 Dyspnea, unspecified: Secondary | ICD-10-CM | POA: Diagnosis not present

## 2022-04-14 DIAGNOSIS — Z62820 Parent-biological child conflict: Secondary | ICD-10-CM | POA: Diagnosis not present

## 2022-04-14 LAB — COMPREHENSIVE METABOLIC PANEL
ALT: 29 U/L (ref 0–44)
AST: 14 U/L — ABNORMAL LOW (ref 15–41)
Albumin: 3.9 g/dL (ref 3.5–5.0)
Alkaline Phosphatase: 54 U/L (ref 38–126)
Anion gap: 12 (ref 5–15)
BUN: 17 mg/dL (ref 8–23)
CO2: 23 mmol/L (ref 22–32)
Calcium: 9.8 mg/dL (ref 8.9–10.3)
Chloride: 103 mmol/L (ref 98–111)
Creatinine, Ser: 0.93 mg/dL (ref 0.44–1.00)
GFR, Estimated: >60 mL/min (ref >60)
Glucose, Bld: 95 mg/dL (ref 70–99)
Potassium: 4 mmol/L (ref 3.5–5.1)
Sodium: 138 mmol/L (ref 135–145)
Total Bilirubin: 1.1 mg/dL (ref 0.3–1.2)
Total Protein: 6.6 g/dL (ref 6.5–8.1)

## 2022-04-14 LAB — CBC WITH DIFFERENTIAL/PLATELET
Abs Immature Granulocytes: 0.06 10*3/uL (ref 0.00–0.07)
Basophils Absolute: 0 10*3/uL (ref 0.0–0.1)
Basophils Relative: 0 %
Eosinophils Absolute: 0.7 10*3/uL — ABNORMAL HIGH (ref 0.0–0.5)
Eosinophils Relative: 9 %
HCT: 35.8 % — ABNORMAL LOW (ref 36.0–46.0)
Hemoglobin: 11.9 g/dL — ABNORMAL LOW (ref 12.0–15.0)
Immature Granulocytes: 1 %
Lymphocytes Relative: 35 %
Lymphs Abs: 2.5 10*3/uL (ref 0.7–4.0)
MCH: 31.2 pg (ref 26.0–34.0)
MCHC: 33.2 g/dL (ref 30.0–36.0)
MCV: 94 fL (ref 80.0–100.0)
Monocytes Absolute: 1.4 10*3/uL — ABNORMAL HIGH (ref 0.1–1.0)
Monocytes Relative: 19 %
Neutro Abs: 2.6 10*3/uL (ref 1.7–7.7)
Neutrophils Relative %: 36 %
Platelets: 259 10*3/uL (ref 150–400)
RBC: 3.81 MIL/uL — ABNORMAL LOW (ref 3.87–5.11)
RDW: 14.1 % (ref 11.5–15.5)
WBC: 7.2 10*3/uL (ref 4.0–10.5)
nRBC: 0 % (ref 0.0–0.2)

## 2022-04-14 LAB — TROPONIN I (HIGH SENSITIVITY)
Troponin I (High Sensitivity): 3 ng/L (ref <18)
Troponin I (High Sensitivity): 3 ng/L (ref <18)

## 2022-04-14 LAB — D-DIMER, QUANTITATIVE: D-Dimer, Quant: 0.84 ug/mL-FEU — ABNORMAL HIGH (ref 0.00–0.50)

## 2022-04-14 IMAGING — DX DG CHEST 2V
2 series · 2 of 2 positions shown · non-contrast
Comparison: None Available.

CLINICAL DATA: Shortness of breath

EXAM:
CHEST - 2 VIEW

[chest pa]
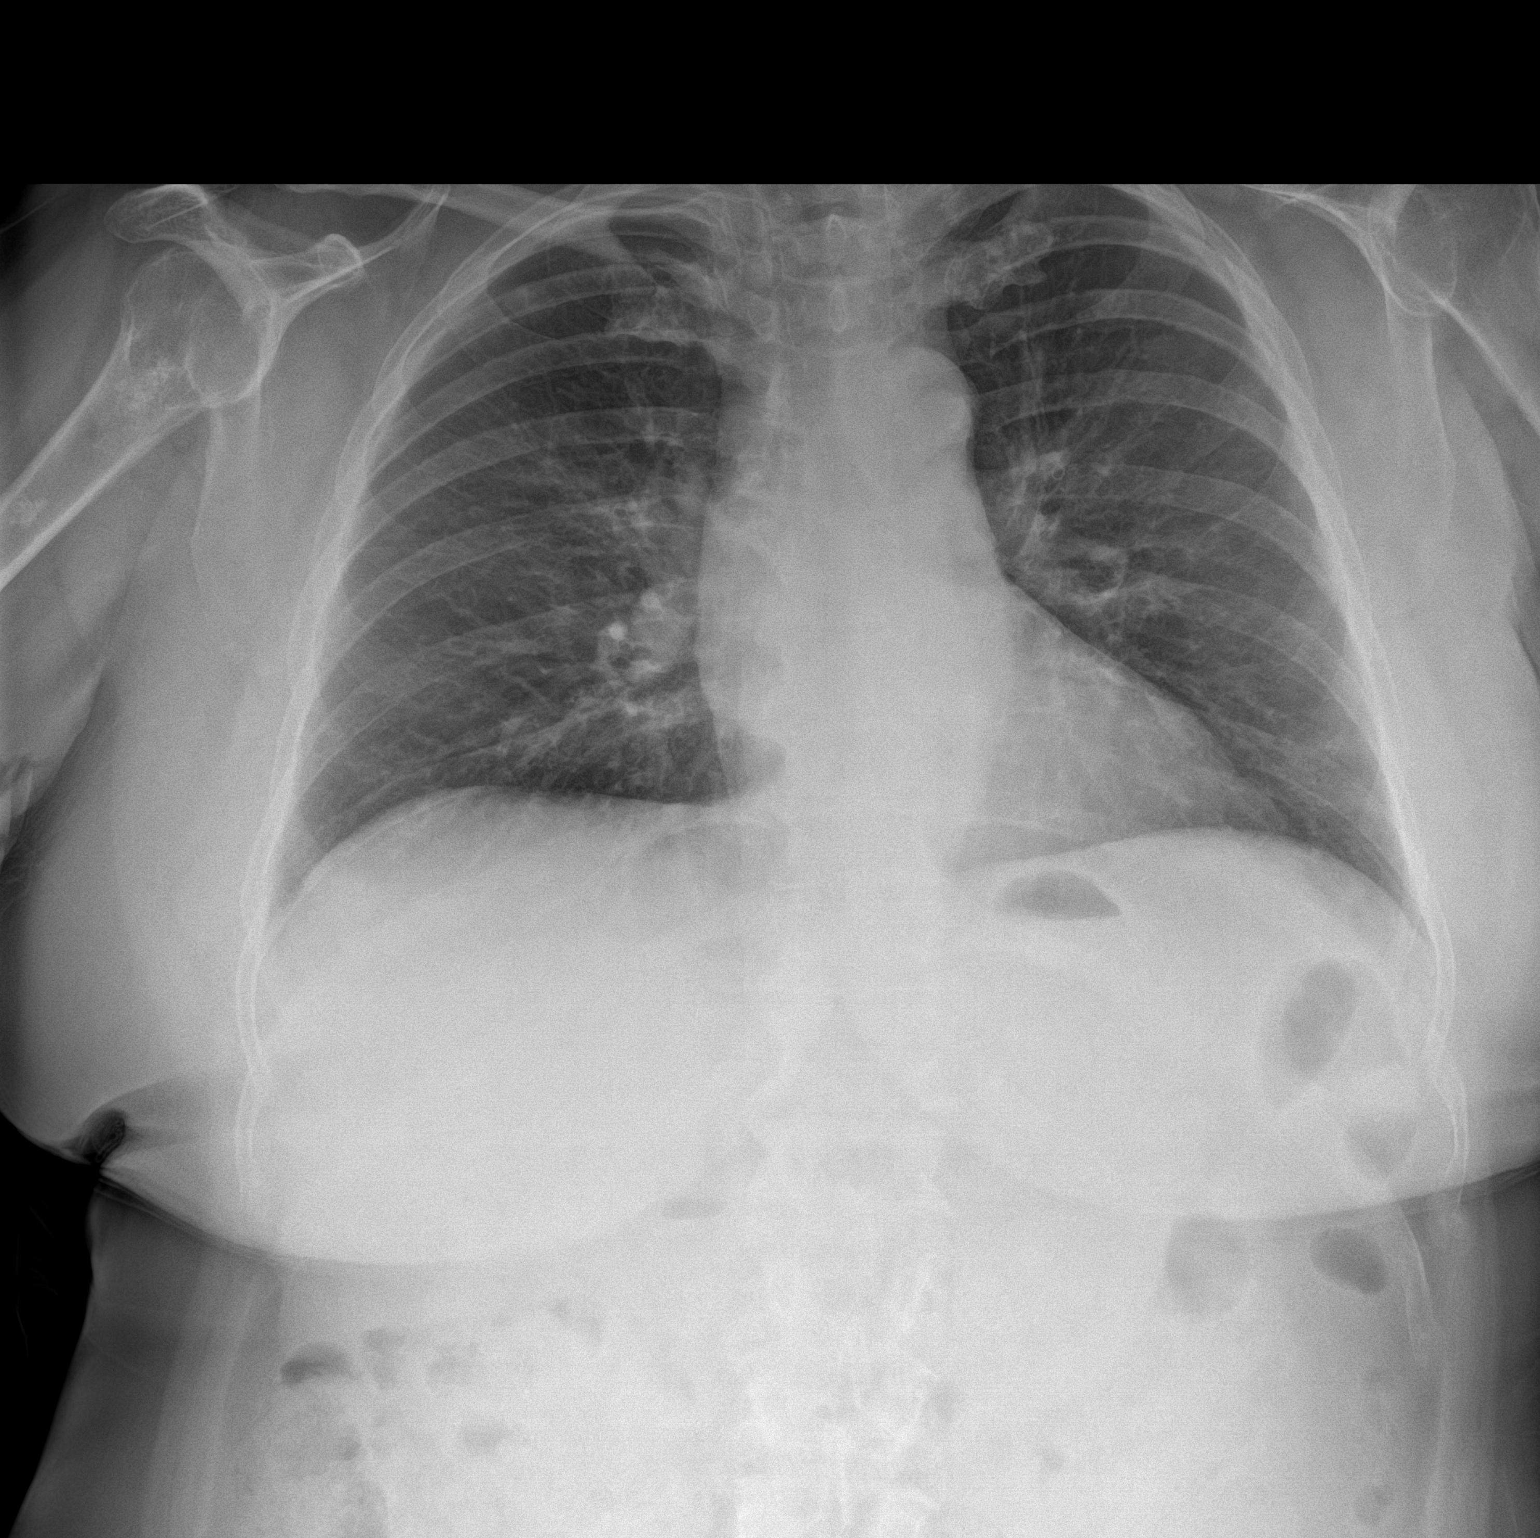

[chest lat]
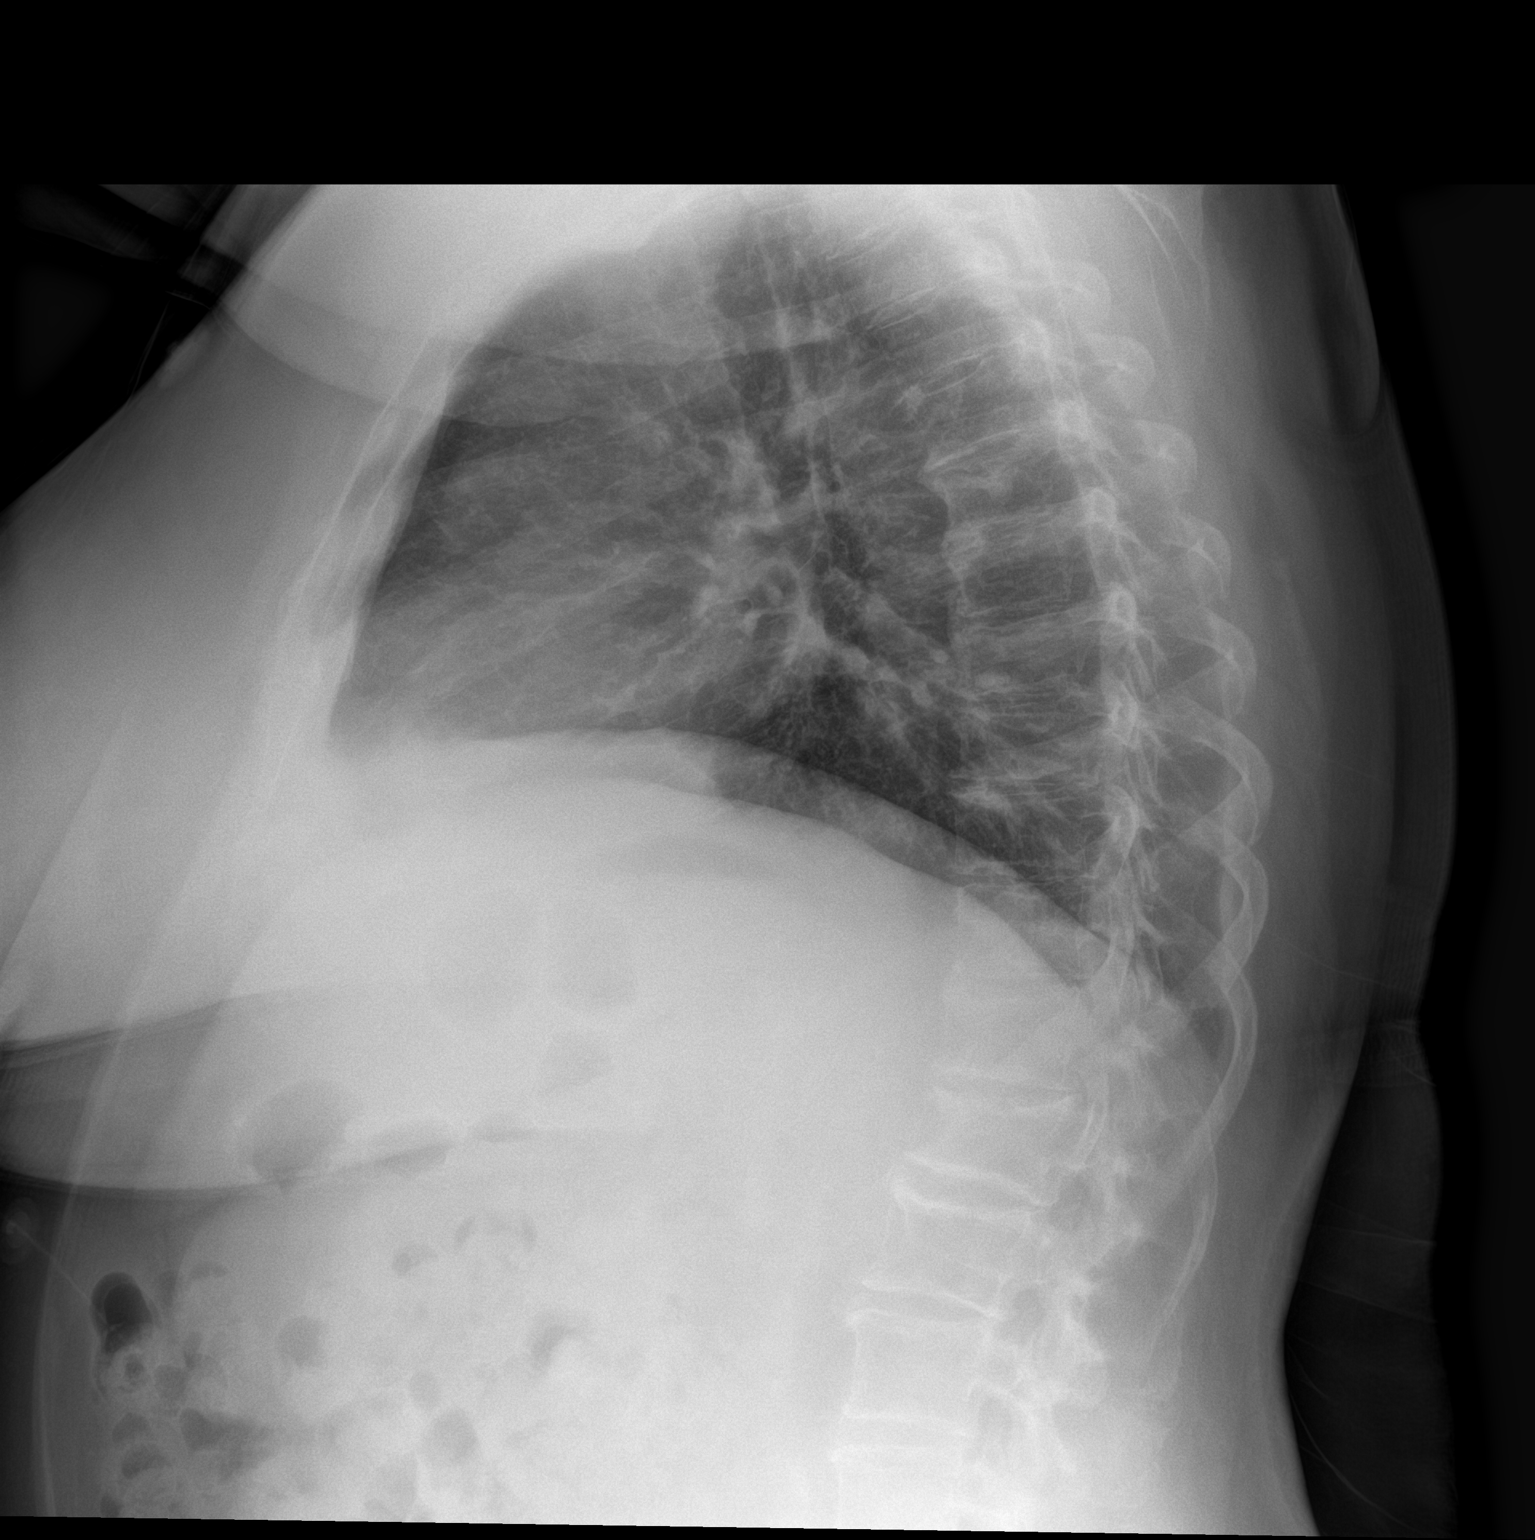

[2 of 2 positions shown; findings below may reference images not displayed]

FINDINGS: Cardiomediastinal silhouette is unchanged. There is no focal
airspace consolidation. No pleural effusion. No pneumothorax. No
acute osseous abnormality. Unchanged sclerotic lesion in the right
proximal humerus likely bone infarct or enchondroma, seen dating
back to [DATE] right humerus radiographs.
IMPRESSION: No evidence of acute cardiopulmonary disease.

## 2022-04-14 IMAGING — CT CT ANGIO CHEST
2 of 7 series · 17 of 46 positions shown · IV contrast (agent unspecified)
Comparison: None Available.

CLINICAL DATA: Pulmonary embolism (PE) suspected, positive D-dimer.
Dyspnea, elevated D-dimer, recent COVID infection.

EXAM:
CT ANGIOGRAPHY CHEST WITH CONTRAST
TECHNIQUE: Multidetector CT imaging of the chest was performed using the
standard protocol during bolus administration of intravenous
contrast. Multiplanar CT image reconstructions and MIPs were
obtained to evaluate the vascular anatomy.

[Series 5: pe axial thins · axial · 0.80mm/px · z∈[+981,+1218]mm · 14 of 271 slices shown]
[im 17/271  lung]
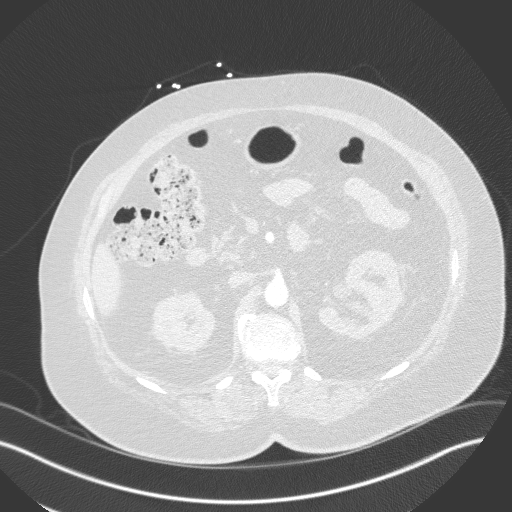
[im 34/271  soft-tissue]
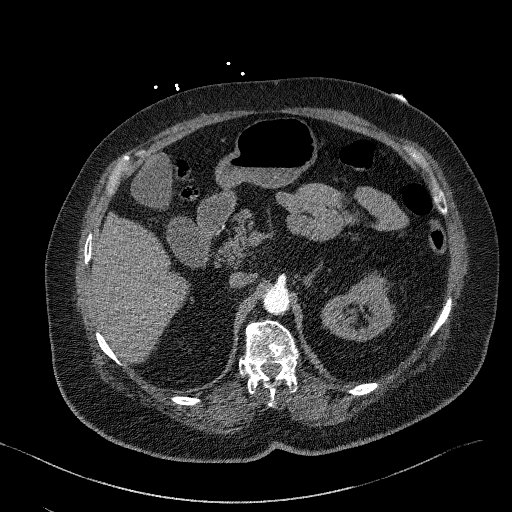
[im 51/271  lung]
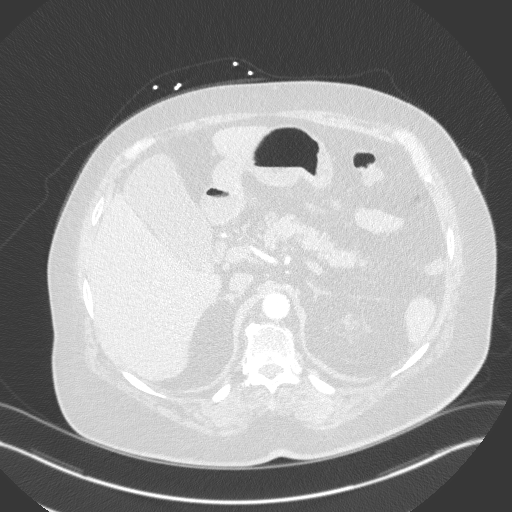
[im 68/271  soft-tissue]
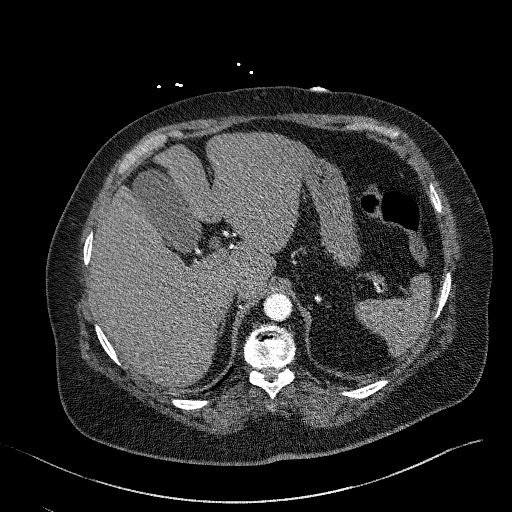
[im 85/271  lung]
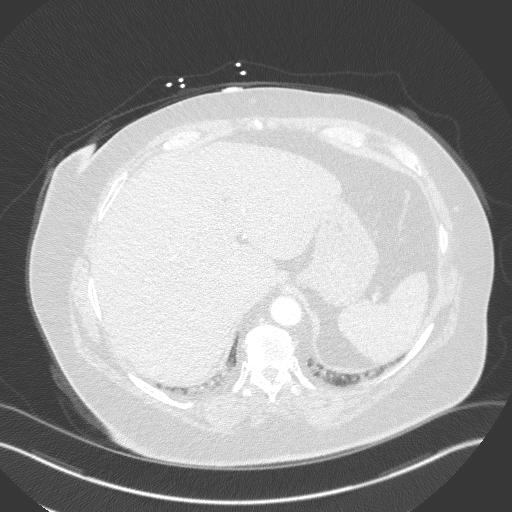
[im 102/271  soft-tissue]
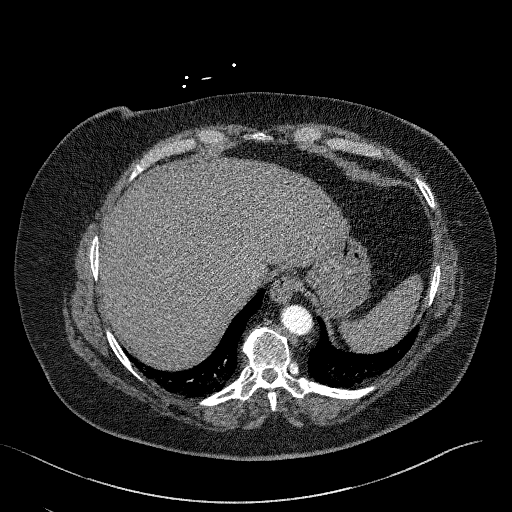
[im 119/271  lung]
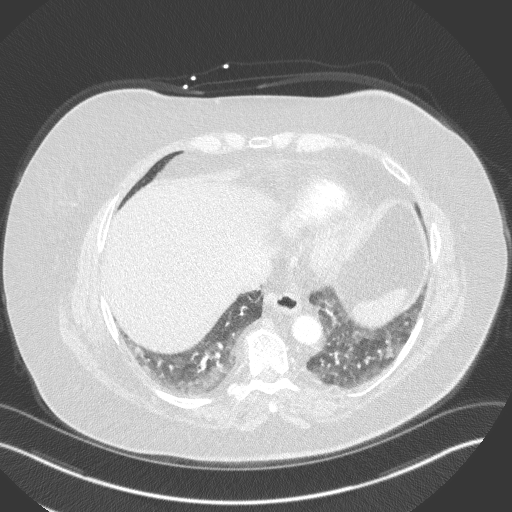
[im 152/271  soft-tissue]
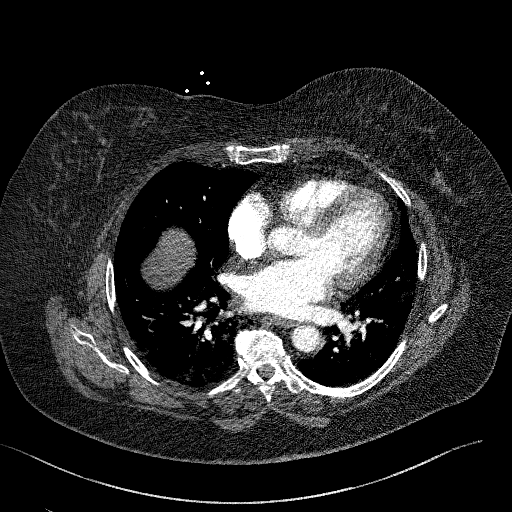
[im 169/271  lung]
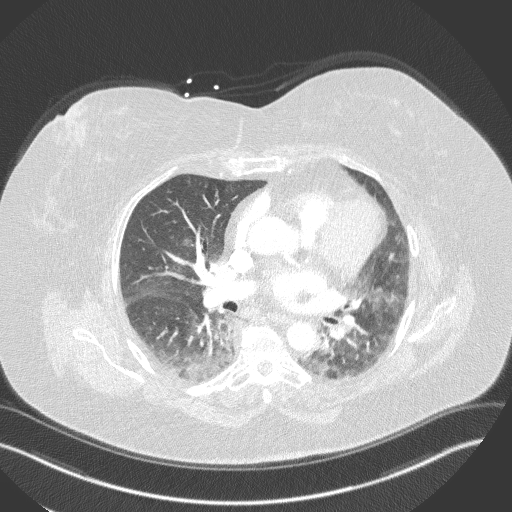
[im 186/271  soft-tissue]
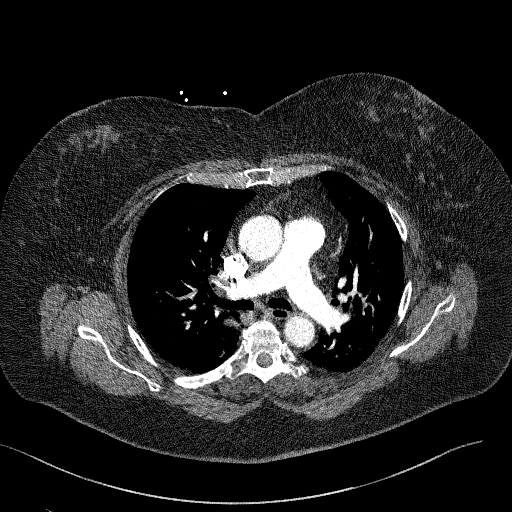
[im 203/271  lung]
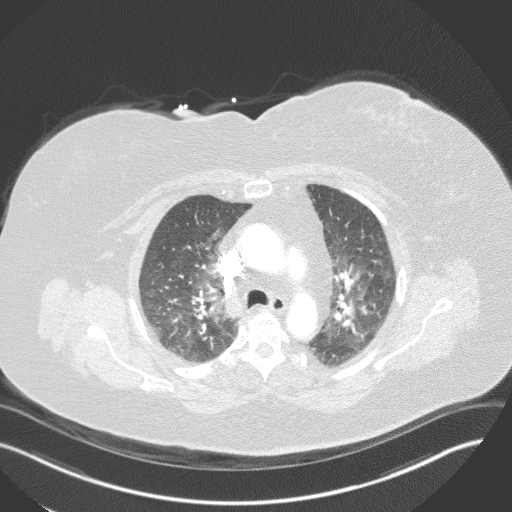
[im 220/271  soft-tissue]
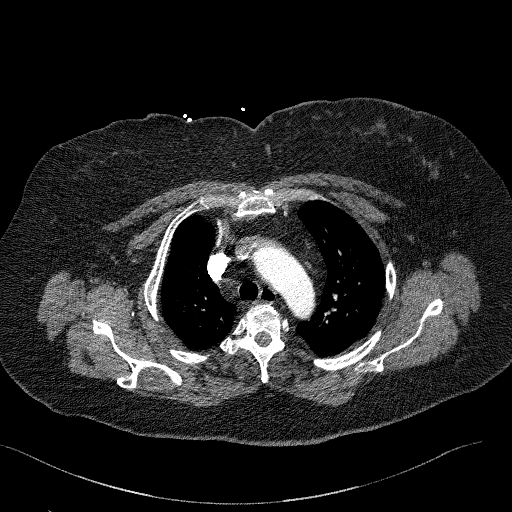
[im 237/271  lung]
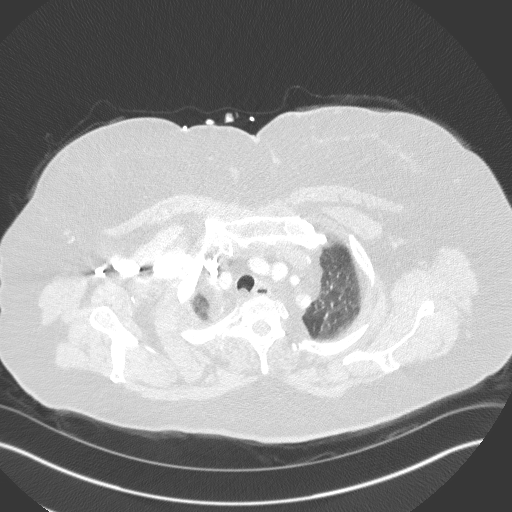
[im 254/271  soft-tissue]
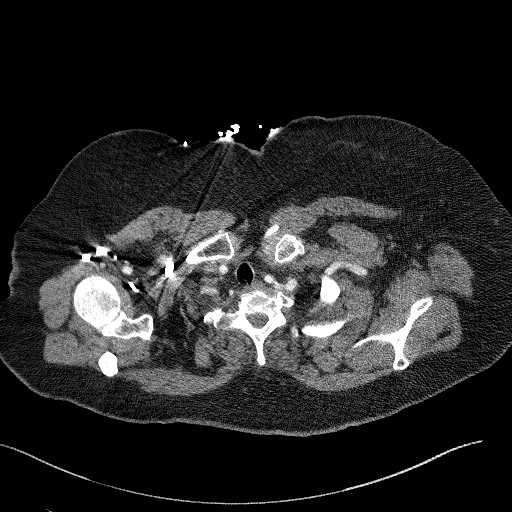

[Series 7: cor soft · coronal · 0.55mm/px · 3 of 158 slices shown]
[im 40/158  soft-tissue]
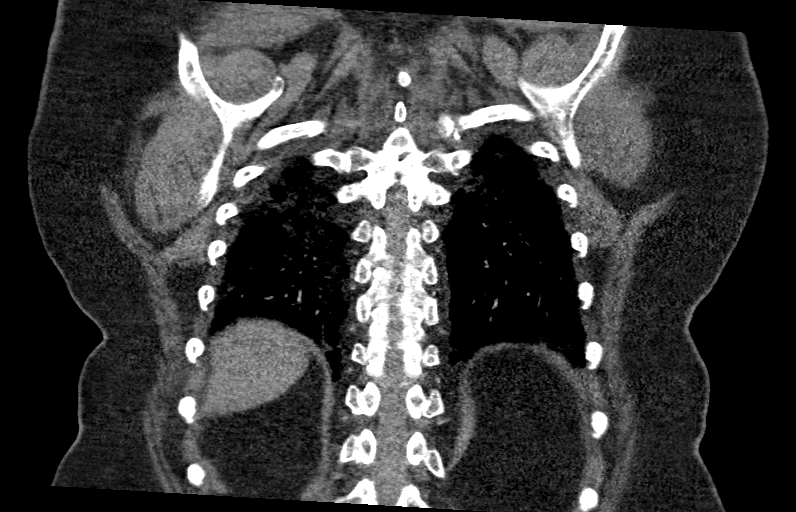
[im 79/158  soft-tissue]
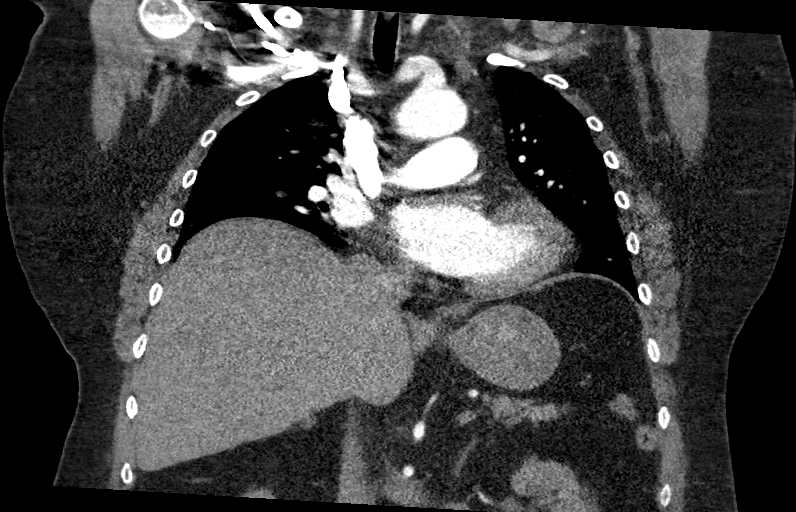
[im 118/158  soft-tissue]
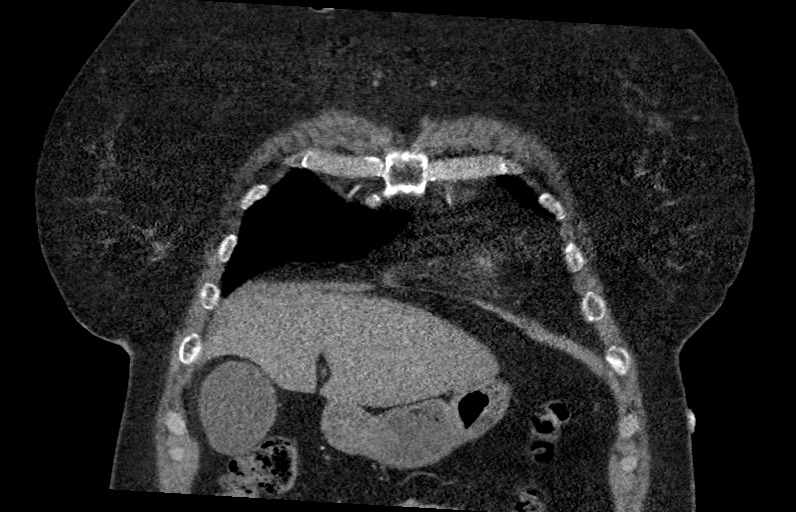

[17 of 46 positions shown; findings below may reference images not displayed]

RADIATION DOSE REDUCTION: This exam was performed according to the
departmental dose-optimization program which includes automated
exposure control, adjustment of the mA and/or kV according to
patient size and/or use of iterative reconstruction technique.

CONTRAST:  61mL OMNIPAQUE IOHEXOL 350 MG/ML SOLN
FINDINGS: Cardiovascular: Adequate opacification of the pulmonary arterial
tree. No intraluminal filling defect identified to suggest acute
pulmonary embolism. Central pulmonary arteries are of normal
caliber. No significant coronary artery calcification. Cardiac size
within normal limits. No pericardial effusion. No significant
atherosclerotic calcification within the thoracic aorta. No aortic
aneurysm.

Mediastinum/Nodes: No enlarged mediastinal, hilar, or axillary lymph
nodes. Thyroid gland, trachea, and esophagus demonstrate no
significant findings.

Lungs/Pleura: Patchy asymmetric ground-glass pulmonary infiltrate
within the mid and lower lung zones bilaterally, more prevalent
within the left perihilar region and right lower lobe subpleurally
is likely infectious or inflammatory in nature in can be seen the
setting of [I5] pneumonia. No pneumothorax or pleural effusion.
Central airways are widely patent.

Upper Abdomen: Probable parapelvic cysts within the visualized left
kidney appear enlarged when compared to remote prior examination of
[DATE], but are not well characterized on this examination. No
acute abnormality identified.

Musculoskeletal: No acute bone abnormality. No lytic or blastic bone
lesion.

Review of the MIP images confirms the above findings.
IMPRESSION: No pulmonary embolism.

Multifocal pulmonary infiltrates, likely infectious or inflammatory
and compatible with reported recent [I5] pneumonia.

## 2022-04-14 MED ORDER — IOHEXOL 350 MG/ML SOLN
100.0000 mL | Freq: Once | INTRAVENOUS | Status: AC | PRN
Start: 2022-04-14 — End: 2022-04-14
  Administered 2022-04-14: 61 mL via INTRAVENOUS

## 2022-04-14 MED ORDER — MORPHINE SULFATE (PF) 4 MG/ML IV SOLN
4.0000 mg | Freq: Once | INTRAVENOUS | Status: AC
  Administered 2022-04-14: 4 mg via INTRAVENOUS
  Filled 2022-04-14: qty 1

## 2022-04-14 MED ORDER — ALBUTEROL SULFATE HFA 108 (90 BASE) MCG/ACT IN AERS
2.0000 | INHALATION_SPRAY | RESPIRATORY_TRACT | Status: DC | PRN
Start: 2022-04-14 — End: 2022-04-15
  Administered 2022-04-14: 2 via RESPIRATORY_TRACT
  Filled 2022-04-14: qty 6.7

## 2022-04-14 MED ORDER — PREDNISONE 20 MG PO TABS: 40.0000 mg | ORAL_TABLET | Freq: Every day | ORAL | 0 refills | Status: DC

## 2022-04-14 MED ORDER — METHYLPREDNISOLONE SODIUM SUCC 125 MG IJ SOLR
125.0000 mg | Freq: Once | INTRAMUSCULAR | Status: AC
  Administered 2022-04-14: 125 mg via INTRAVENOUS
  Filled 2022-04-14: qty 2

## 2022-04-14 MED ORDER — MAGNESIUM SULFATE 2 GM/50ML IV SOLN
2.0000 g | Freq: Once | INTRAVENOUS | Status: AC
  Administered 2022-04-14: 2 g via INTRAVENOUS
  Filled 2022-04-14: qty 50

## 2022-04-14 MED ORDER — IPRATROPIUM BROMIDE 0.02 % IN SOLN
0.5000 mg | Freq: Once | RESPIRATORY_TRACT | Status: AC
  Administered 2022-04-14: 0.5 mg via RESPIRATORY_TRACT
  Filled 2022-04-14: qty 2.5

## 2022-04-14 MED ORDER — DEXAMETHASONE 4 MG PO TABS: ORAL_TABLET | ORAL | 0 refills | Status: DC

## 2022-04-14 MED ORDER — ONDANSETRON HCL 4 MG/2ML IJ SOLN
4.0000 mg | Freq: Once | INTRAMUSCULAR | Status: AC
  Administered 2022-04-14: 4 mg via INTRAVENOUS
  Filled 2022-04-14: qty 2

## 2022-04-14 MED ORDER — AMOXICILLIN-POT CLAVULANATE 875-125 MG PO TABS: 1.0000 | ORAL_TABLET | Freq: Two times a day (BID) | ORAL | 0 refills | Status: DC

## 2022-04-14 NOTE — ED Provider Notes (Signed)
[facility] DEPT Provider Note   CSN: [id number] Arrival date & time: 04/14/22  1523     History  Chief Complaint  Patient presents with   Shortness of Breath         Haley White is a 66 y.o. female.  Patient is a 66 year old female with a history of hyperlipidemia, hypertension and asthma who reports she had COVID approximately 3 weeks ago and when she had COVID she was very sick with cough and congestion but reports she thought she had been getting better but in the last week her symptoms have been worsening.  She has had persistent nonproductive cough, shortness of breath and pain with breathing.  She reports with any activity she becomes more short of breath.  She has not noted any significant fever and has not had diarrhea or vomiting since having COVID 3 weeks ago.  She has not noticed any lower extremity edema and has no known heart problems.  She has been using her inhaler at home and reports initially it was helping but has not seem to be working in the last few days.  She checked her pulse ox at home and it was running between 87 and 92%   Shortness of Breath      Home Medications Prior to Admission medications   Medication Sig Start Date End Date Taking? Authorizing Provider  amoxicillin-clavulanate (AUGMENTIN) 875-125 MG tablet Take 1 tablet by mouth every 12 (twelve) hours. 04/14/22  Yes Darrold Bezek, Loree Fee, MD  predniSONE (DELTASONE) 20 MG tablet Take 2 tablets (40 mg total) by mouth daily. Take 2 tabs ('40mg'$ ) for the first 7 days then 1tab ('20mg'$ ) for the next 4 days, then 0.5tab ('10mg'$ ) for 4 days 04/14/22  Yes Blanchie Dessert, MD  albuterol (VENTOLIN HFA) 108 (90 Base) MCG/ACT inhaler Inhale 2 puffs into the lungs every 4 (four) hours as needed for wheezing or shortness of breath. 07/17/17   Liane Comber, NP  Ascorbic Acid (VITAMIN C PO) Take 1 tablet by mouth. Patient not taking: Reported on 04/10/2022    [provider]  B Complex  Vitamins (VITAMIN B-COMPLEX PO) Take 1 tablet by mouth daily.    [provider]  buPROPion (WELLBUTRIN XL) 300 MG 24 hr tablet Take 300 mg by mouth every morning. 12/17/20   [provider]  Calcium Carbonate (CALCIUM 600 PO) Take by mouth daily.    [provider]  celecoxib (CELEBREX) 100 MG capsule Take 1 capsule (100 mg total) by mouth 2 (two) times daily. 02/20/22   Trula Slade, DPM  dexamethasone (DECADRON) 4 MG tablet Take 1 tab 3 x day - 3 days, then 2 x day - 3 days, then 1 tab daily Patient not taking: Reported on 04/10/2022 03/30/22   Unk Pinto, MD  diphenhydrAMINE (BENADRYL) 25 MG tablet Take 1 tablet (25 mg total) by mouth at bedtime as needed for up to 12 doses for sleep (Headache). Take with reglan as needed at bedtime to help with headache and sleep 03/22/22   Wyvonnia Dusky, MD  EPINEPHrine 0.3 mg/0.3 mL IJ SOAJ injection See admin instructions. 02/08/20   [provider]  famotidine (PEPCID) 40 MG tablet Take  1 tablet  at Night to Prevent Heartburn & Acid Reflux 04/02/22   Unk Pinto, MD  fexofenadine (ALLEGRA) 180 MG tablet 1 tablet    [provider]  fluconazole (DIFLUCAN) 100 MG tablet Take  1 tablet  Daily  for Oral Yeast infection 04/10/22  Unk Pinto, MD  FLUoxetine (PROZAC) 20 MG capsule Take 1 capsule (20 mg total) by mouth daily. For depression 05/08/15   Lindell Spar I, NP  fluticasone (FLONASE) 50 MCG/ACT nasal spray SHAKE LQ AND U 1 TO 2 SPRAYS IEN QD 10/17/17   [provider]  gabapentin (NEURONTIN) 300 MG capsule Take 1 capsule  3 x /day  for Shingles Neuritis 03/30/22   Unk Pinto, MD  levocetirizine (XYZAL) 5 MG tablet TK 1 T PO QD IN THE EVE 12/07/17   [provider]  lisinopril (ZESTRIL) 20 MG tablet TAKE 1 TABLET BY MOUTH DAILY FOR BLOOD PRESSURE 12/01/21   Alycia Rossetti, NP  magic mouthwash (nystatin, hydrocortisone, diphenhydrAMINE) suspension Take  1 teaspoon (5 ml)    Swish & Swallow  every 2 hours for mouth ulcers 04/07/22   Unk Pinto, MD  methylphenidate (RITALIN) 10 MG tablet TK 1 T PO BID 01/16/18   [provider]  metoCLOPramide (REGLAN) 10 MG tablet Take 1 tablet (10 mg total) by mouth every 12 (twelve) hours as needed for up to 12 doses for nausea. 03/22/22   Wyvonnia Dusky, MD  montelukast (SINGULAIR) 10 MG tablet 1 tablet    [provider]  pantoprazole (PROTONIX) 40 MG tablet Take  1 tablet  every Morning to Prevent Heartburn & Acid Reflux 04/02/22   Unk Pinto, MD  potassium chloride (KLOR-CON) 10 MEQ tablet Take 1 tablet (10 mEq total) by mouth daily for 15 days. 03/22/22 04/06/22  Wyvonnia Dusky, MD  promethazine-dextromethorphan (PROMETHAZINE-DM) 6.25-15 MG/5ML syrup Take 5 mL as needed at bedtime for severe cough 03/18/22   Darrol Jump, NP  rosuvastatin (CRESTOR) 40 MG tablet TAKE 1 TABLET BY MOUTH DAILY FOR CHOLESTEROL 11/01/21   Liane Comber, NP  SYMBICORT 160-4.5 MCG/ACT inhaler INL 2 PFS PO BID 01/16/18   [provider]  valACYclovir (VALTREX) 1000 MG tablet Take 1 tablet 3 x day for Shingles 03/30/22   Unk Pinto, MD  VITAMIN D PO Take 15,000 Units by mouth daily.    [provider]      Allergies    Molds & smuts, Biaxin [clarithromycin], Serevent [salmeterol], and Tequin [gatifloxacin]    Review of Systems   Review of Systems  Respiratory:  Positive for shortness of breath.     Physical Exam Updated Vital Signs BP 136/73   Pulse 92   Temp 98.3 F (36.8 C)   Resp 13   LMP 11/26/2011   SpO2 95%  Physical Exam Vitals and nursing note reviewed.  Constitutional:      General: She is not in acute distress.    Appearance: She is well-developed.     Comments: Appears winded  HENT:     Head: Normocephalic and atraumatic.  Eyes:     Pupils: Pupils are equal, round, and reactive to light.  Cardiovascular:     Rate and Rhythm: Regular rhythm. Tachycardia present.      Heart sounds: Normal heart sounds. No murmur heard.    No friction rub.  Pulmonary:     Effort: Pulmonary effort is normal. Tachypnea present.     Breath sounds: Decreased breath sounds present. No wheezing or rales.  Abdominal:     General: Bowel sounds are normal. There is no distension.     Palpations: Abdomen is soft.     Tenderness: There is no abdominal tenderness. There is no guarding or rebound.  Musculoskeletal:        General: No tenderness.  Normal range of motion.     Right lower leg: No edema.     Left lower leg: No edema.     Comments: No edema  Skin:    General: Skin is warm and dry.     Findings: No rash.  Neurological:     Mental Status: She is alert and oriented to person, place, and time.     Cranial Nerves: No cranial nerve deficit.  Psychiatric:        Behavior: Behavior normal.     ED Results / Procedures / Treatments   Labs (all labs ordered are listed, but only abnormal results are displayed) Labs Reviewed  CBC WITH DIFFERENTIAL/PLATELET - Abnormal; Notable for the following components:      Result Value   RBC 3.81 (*)    Hemoglobin 11.9 (*)    HCT 35.8 (*)    Monocytes Absolute 1.4 (*)    Eosinophils Absolute 0.7 (*)    All other components within normal limits  COMPREHENSIVE METABOLIC PANEL - Abnormal; Notable for the following components:   AST 14 (*)    All other components within normal limits  D-DIMER, QUANTITATIVE - Abnormal; Notable for the following components:   D-Dimer, Quant 0.84 (*)    All other components within normal limits  TROPONIN I (HIGH SENSITIVITY)  TROPONIN I (HIGH SENSITIVITY)    EKG EKG Interpretation  Date/Time:  Sunday April 14 2022 15:40:10 EDT Ventricular Rate:  98 PR Interval:  152 QRS Duration: 72 QT Interval:  324 QTC Calculation: 413 R Axis:   80 Text Interpretation: Normal sinus rhythm Low voltage QRS Cannot rule out Anterior infarct (cited on or before 28-Mar-2022) When compared with ECG of 28-Mar-2022  12:42, Criteria for Inferior infarct are no longer Present T wave inversion no longer evident in Lateral leads Confirmed by Blanchie Dessert [id number]) on 04/14/2022 4:28:12 PM  Radiology CT Angio Chest PE W and/or Wo Contrast  Result Date: 04/14/2022 CLINICAL DATA:  Pulmonary embolism (PE) suspected, positive D-dimer. Dyspnea, elevated D-dimer, recent COVID infection. EXAM: CT ANGIOGRAPHY CHEST WITH CONTRAST TECHNIQUE: Multidetector CT imaging of the chest was performed using the standard protocol during bolus administration of intravenous contrast. Multiplanar CT image reconstructions and MIPs were obtained to evaluate the vascular anatomy. RADIATION DOSE REDUCTION: This exam was performed according to the departmental dose-optimization program which includes automated exposure control, adjustment of the mA and/or kV according to patient size and/or use of iterative reconstruction technique. CONTRAST:  65m OMNIPAQUE IOHEXOL 350 MG/ML SOLN COMPARISON:  None Available. FINDINGS: Cardiovascular: Adequate opacification of the pulmonary arterial tree. No intraluminal filling defect identified to suggest acute pulmonary embolism. Central pulmonary arteries are of normal caliber. No significant coronary artery calcification. Cardiac size within normal limits. No pericardial effusion. No significant atherosclerotic calcification within the thoracic aorta. No aortic aneurysm. Mediastinum/Nodes: No enlarged mediastinal, hilar, or axillary lymph nodes. Thyroid gland, trachea, and esophagus demonstrate no significant findings. Lungs/Pleura: Patchy asymmetric ground-glass pulmonary infiltrate within the mid and lower lung zones bilaterally, more prevalent within the left perihilar region and right lower lobe subpleurally is likely infectious or inflammatory in nature in can be seen the setting of COVID-19 pneumonia. No pneumothorax or pleural effusion. Central airways are widely patent. Upper Abdomen: Probable parapelvic  cysts within the visualized left kidney appear enlarged when compared to remote prior examination of 04/05/2012, but are not well characterized on this examination. No acute abnormality identified. Musculoskeletal: No acute bone abnormality. No lytic or blastic bone lesion. Review of  the MIP images confirms the above findings. IMPRESSION: No pulmonary embolism. Multifocal pulmonary infiltrates, likely infectious or inflammatory and compatible with reported recent COVID-19 pneumonia. Electronically Signed   By: Fidela Salisbury M.D.   On: 04/14/2022 18:42   DG Chest 2 View  Result Date: 04/14/2022 CLINICAL DATA:  Shortness of breath EXAM: CHEST - 2 VIEW COMPARISON:  None Available. FINDINGS: Cardiomediastinal silhouette is unchanged. There is no focal airspace consolidation. No pleural effusion. No pneumothorax. No acute osseous abnormality. 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 IMPRESSION: No evidence of acute cardiopulmonary disease. Electronically Signed   By: Maurine Simmering M.D.   On: 04/14/2022 16:09    Procedures Procedures    Medications Ordered in ED Medications  albuterol (VENTOLIN HFA) 108 (90 Base) MCG/ACT inhaler 2 puff (2 puffs Inhalation Given 04/14/22 1613)  magnesium sulfate IVPB 2 g 50 mL (0 g Intravenous Stopped 04/14/22 1745)  methylPREDNISolone sodium succinate (SOLU-MEDROL) 125 mg/2 mL injection 125 mg (125 mg Intravenous Given 04/14/22 1725)  ipratropium (ATROVENT) nebulizer solution 0.5 mg (0.5 mg Nebulization Given 04/14/22 1636)  morphine (PF) 4 MG/ML injection 4 mg (4 mg Intravenous Given 04/14/22 1659)  ondansetron (ZOFRAN) injection 4 mg (4 mg Intravenous Given 04/14/22 1658)  iohexol (OMNIPAQUE) 350 MG/ML injection 100 mL (61 mLs Intravenous Contrast Given 04/14/22 1823)    ED Course/ Medical Decision Making/ A&P                           Medical Decision  Making Amount and/or Complexity of Data Reviewed External Data Reviewed: notes. Labs: ordered. Decision-making details documented in ED Course. Radiology: ordered and independent interpretation performed. Decision-making details documented in ED Course. ECG/medicine tests: ordered and independent interpretation performed. Decision-making details documented in ED Course.  Risk Prescription drug management.   Pt with multiple medical problems and comorbidities and presenting today with a complaint that caries a high risk for morbidity and mortality.  Presenting today with complaint of shortness of breath.  This is in light of a history of asthma and having COVID 3 weeks ago.  Concern for possible secondary bacterial pneumonia versus PE.  Patient is not displaying signs significant for fluid overload and low suspicion for CHF.  Lower suspicion for ACS.  She has no abdominal findings concerning for an acute abdominal process.  Patient has some mild decreased breath sounds and is tachypneic.  Given her history of asthma could be asthma exacerbation as well.  Patient given pain control, albuterol, Atrovent and magnesium.  Labs are pending.  I independently interpreted patient's EKG today which shows no significant changes.  I have independently visualized and interpreted pt's images today.  Chest x-ray without acute findings today.  8:26 PM I independently interpreted patient's labs today and troponin, CBC, MP are all within normal limits.  D-dimer was elevated at 0.84.  CT PE scan was done which does not show any evidence of PE however it does show multifocal pulmonary infiltrates likely infectious or inflammatory and compatible with most likely recent COVID-pneumonia.  Findings were discussed with the patient.  At rest sats remain normal and after treatment she is breathing more comfortably.  However feel that we need to ambulate patient to ensure she does not have any hypoxia.  She finished a course of  steroids last week and feel that she needs a further course of steroids and a  short course of antibiotics.  She will need follow-up with her pulmonologist.  At this time no indication for admission as when she walked her oxygen saturation remains greater than 91% on room air.          Final Clinical Impression(s) / ED Diagnoses Final diagnoses:  Pneumonia due to 2019 novel coronavirus    Rx / DC Orders ED Discharge Orders          Ordered    amoxicillin-clavulanate (AUGMENTIN) 875-125 MG tablet  Every 12 hours        04/14/22 2022    predniSONE (DELTASONE) 20 MG tablet  Daily        04/14/22 2022    Ambulatory referral to Pulmonology        04/14/22 2026              Blanchie Dessert, MD 04/14/22 2026

## 2022-04-14 NOTE — ED Triage Notes (Signed)
Patient reports to the ER for shortness of breath after having COVID x3 weeks ago. Patient reports being unable to walk long distances or stand for more than a few minutes without becoming winded.

## 2022-04-14 NOTE — Discharge Instructions (Signed)
It does look like you have multifocal pneumonia from COVID or inflammation from having COVID.  We are just starting an antibiotic just in case there is something bacterial but it may all be related to inflammation so also you are getting more steroids.  Use the inhaler as needed.  If you suddenly feel like the shortness of breath is more severe you are having significant pain or your passing out return to the emergency room immediately.

## 2022-04-14 NOTE — ED Notes (Signed)
RT Note: Pt. educated on use of Albuterol and her other Inhalers,(Symbicort) with the use of Spacer for best results.

## 2022-04-16 NOTE — Progress Notes (Signed)
Subjective: Haley White is a 66 y.o. is seen today in office s/p left peroneal tendon repair, anastomosis subsequently she had a fall and had a fracture posterior calcaneus.  She states that she is doing better.  She has been doing home physical therapy.  She recently had COVID issues or other issues which is taking precedent but overall the ankles been doing okay.  Some some tenderness.  Denies any recent injury or trauma otherwise.  Objective: General: No acute distress, AAOx3  DP/PT pulses palpable 2/4, CRT < 3 sec to all digits.  Protective sensation intact. Motor function intact.  Left foot: Incision site along the peroneal tendon has healed with a scar.  There is also mild discomfort just posterior to lateral malleolus but no significant edema is present there is no erythema or warmth but there is no significant tenderness and identified otherwise. MMT 5/5. No pain with calf compression, swelling, warmth, erythema.   MRI 09/08/2021 IMPRESSION: 1. Insufficiency fracture of the calcaneal tuberosity superiorly, near the Achilles insertion, as can be seen associated with diabetes. No displaced fracture or Achilles tendon tear identified. 2. Postsurgical changes related to repair of the previously demonstrated longitudinal split tear of the peroneus brevis tendon. No new tendon findings. 3. Stable subchondral cysts in the lateral talar dome and midfoot degenerative changes. No other acute osseous findings.  Assessment and Plan:  Status post left peroneal tendon anastomosis; calcaneal fracture  -Treatment options discussed including all alternatives, risks, and complications -Overall continues to improve.  Still having some residual tenderness.  Encouraged to continue home stretching, rehab exercises on a regular basis, shoes and good arch support, orthotics. -Has follow-up scheduled with rheumatology given multiple joint pains.  No follow-ups on file.  Trula Slade DPM

## 2022-04-17 ENCOUNTER — Ambulatory Visit: Payer: Medicare HMO | Admitting: Internal Medicine

## 2022-04-17 ENCOUNTER — Encounter: Payer: Self-pay | Admitting: Internal Medicine

## 2022-04-17 VITALS — BP 130/80 | HR 94 | Temp 98.4°F | Ht 63.0 in | Wt 210.4 lb

## 2022-04-17 DIAGNOSIS — J4541 Moderate persistent asthma with (acute) exacerbation: Secondary | ICD-10-CM | POA: Diagnosis not present

## 2022-04-17 DIAGNOSIS — U071 COVID-19: Secondary | ICD-10-CM | POA: Diagnosis not present

## 2022-04-17 MED ORDER — IPRATROPIUM-ALBUTEROL 0.5-2.5 (3) MG/3ML IN SOLN
3.0000 mL | Freq: Once | RESPIRATORY_TRACT | Status: AC
  Administered 2022-04-17: 3 mL via RESPIRATORY_TRACT

## 2022-04-17 MED ORDER — BUDESONIDE 0.25 MG/2ML IN SUSP: 0.2500 mg | Freq: Two times a day (BID) | RESPIRATORY_TRACT | 6 refills | Status: DC

## 2022-04-17 MED ORDER — ALBUTEROL SULFATE (2.5 MG/3ML) 0.083% IN NEBU: 2.5000 mg | INHALATION_SOLUTION | Freq: Four times a day (QID) | RESPIRATORY_TRACT | 6 refills | Status: DC | PRN

## 2022-04-17 NOTE — Progress Notes (Signed)
Patient stated that while she was doing the duoneb, she had burning in her chest, it got better as the treatment went on, but it was painful.  By the end of the visit, she stated she was better and the burning sensation was gone.  I checked her vital signs and her O2 was 95% on RA, HR=105, and BP=130/80.  Nothing further needed.

## 2022-04-17 NOTE — Progress Notes (Signed)
Haley White    [id number]    04/26/1956  Primary Care Physician:McKeown, Gwyndolyn Saxon, MD  Referring Physician: Blanchie Dessert, [place],  [place] [id number] Reason for Consultation: covid Date of Consultation: 04/17/2022  Chief complaint:   Chief Complaint  Patient presents with   Consult    Had covid 1 month ago and diagnosed with pna on 04/14/22.  Sob, dry cough, not sleeping well at night.     HPI: Haley White is a 66 y.o. woman who presents for new patient evaluation of shortness of breath. She was diagnosed with covid a month ago - may 13th and was trated with paxlovid on day 4 of symptoms as well as dexamethasone. She has had two ED visits since then for worsening symptoms. Has been treated with additional course of antibiotics and prolonged steroid taper.  She is on her third round of prednisone. Unclear if prednisone is helping.   Low grade fevers to 100. She has reduced appetite but is keeping food down.   She also has asthma and is on a symbicort inhaler for the last ten years. Also gets allergy shots with [facility].   Biggest issues are chest tightness and shortness of breath. Also having cough and pain worsening with taking a deep breath in.    Social history:  Occupation: retired [employer] officer.  Exposures:  lives at home independently.  Smoking history: less than ten pack years  Social History   Occupational History   Not on file  Tobacco Use   Smoking status: Former    Packs/day: 1.00    Years: 7.00    Total pack years: 7.00    Types: Cigarettes    Quit date: 11/04/1977    Years since quitting: 44.4   Smokeless tobacco: Never  Vaping Use   Vaping Use: Never used  Substance and Sexual Activity   Alcohol use: Yes    Alcohol/week: 4.0 standard drinks of alcohol    Types: 4 Glasses of wine per week   Drug use: No   Sexual activity: Yes    Birth control/protection: Surgical    Relevant family history:  Family  History  Problem Relation Age of Onset   Dementia Father    Anxiety disorder Sister    Colon cancer Neg Hx    Colon polyps Neg Hx    Esophageal cancer Neg Hx    Stomach cancer Neg Hx    Rectal cancer Neg Hx     Past Medical History:  Diagnosis Date   Anxiety    Arthritis    Asthma    Cataract    Gout    Hyperlipidemia    Hypertension    Obese     Past Surgical History:  Procedure Laterality Date   APPENDECTOMY  1984   CATARACT EXTRACTION W/ INTRAOCULAR LENS  IMPLANT, BILATERAL  2014   CESAREAN SECTION  1984, 1987, 1994   X3   COLONOSCOPY  11/16/2013   w/Brodie    FOOT SURGERY Left 2002   KNEE SURGERY Left 1998   ROTATOR CUFF REPAIR Right 2007   TUBAL LIGATION  1994     Physical Exam: Blood pressure 130/80, pulse 94, temperature 98.4 F (36.9 C), temperature source Oral, height '5\' 3"'$  (1.6 m), weight 210 lb 6.4 oz (95.4 kg), last menstrual period 11/26/2011, SpO2 95 %. Gen:      No acute distress, tachypnic and fatigued ENT:  no nasal polyps,  mucus membranes moist Lungs:    No increased respiratory effort, symmetric chest wall excursion, clear to auscultation bilaterally, no wheezes or crackles CV:         Regular rate and rhythm; no murmurs, rubs, or gallops.  No pedal edema Abd:      + bowel sounds; soft, non-tender; no distension MSK: no acute synovitis of DIP or PIP joints, no mechanics hands.  Skin:      Warm and dry; no rashes Neuro: normal speech, no focal facial asymmetry Psych: alert and oriented x3, normal mood and affect   Data Reviewed/Medical Decision Making:  Independent interpretation of tests: Imaging:  Review of patient's CT Angio from June 2023 images revealed multifocal ground glass opacities, no PE. The patient's images have been independently reviewed by me.    PFTs: I have personally reviewed the patient's PFTs and      No data to display          Labs:  Lab Results  Component Value Date   WBC 7.2 04/14/2022   HGB 11.9 (L)  04/14/2022   HCT 35.8 (L) 04/14/2022   MCV 94.0 04/14/2022   PLT 259 04/14/2022   Lab Results  Component Value Date   NA 138 04/14/2022   K 4.0 04/14/2022   CL 103 04/14/2022   CO2 23 04/14/2022      Immunization status:  Immunization History  Administered Date(s) Administered   Influenza, High Dose Seasonal PF 10/17/2017   Influenza,inj,Quad PF,6+ Mos 09/17/2017   Influenza-Unspecified 08/20/2018   PFIZER(Purple Top)SARS-COV-2 Vaccination 01/20/2020, 02/10/2020, 03/27/2020   PPD Test 10/08/2017, 11/29/2019, 01/10/2021   Pneumococcal Conjugate-13 11/05/1995   Pneumococcal Polysaccharide-23 10/08/2017, 06/15/2021   Td 11/29/2019   Tdap 11/04/2008, 03/28/2020     I reviewed prior external note(s) from ED, PCP  I reviewed the result(s) of the labs and imaging as noted above.   I have ordered    Assessment:  COVID 19 infection Moderate persistent asthma with acute exacerbation  Plan/Recommendations:  Keep taking symbicort and albuterol inhaler.  Finish your antibotics and steroids as prescribed. Duoneb given in office today.   In addition we will get you a nebulizer machine for home to help with your symptoms.  Add budesonide nebulizer therapy twice a day to your regimen. I will also give your albuterol breathing treatments to take up to 4 times a day as needed. Don't be scared to take these to help expedite your symptom relief.   This will get better eventually - you are just still dealing with symptoms from the covid.   If oxygen levels are dropping below 88% consistently you need to seek medical attention - go to the hospital.    Return to Care: Return in about 2 months (around 06/17/2022).  Lenice Llamas, MD Pulmonary and [place]  CC: Blanchie Dessert, MD

## 2022-04-17 NOTE — Patient Instructions (Addendum)
Please schedule follow up scheduled with myself in 2 months.  If my schedule is not open yet, we will contact you with a reminder closer to that time. Please call [phone] if you haven't heard from [place] a month before.   Keep taking symbicort and albuterol inhaler.  Finish your antibotics and steroids as prescribed  In addition we will get you a nebulizer machine for home to help with your symptoms.  Add budesonide nebulizer therapy twice a day to your regimen. I will also give your albuterol breathing treatments to take up to 4 times a day as needed. Don't be scared to take these to help expedite your symptom relief.   This will get better eventually - you are just still dealing with symptoms from the covid.   If oxygen levels are dropping below 88% consistently you need to seek medical attention - go to the hospital.

## 2022-04-18 ENCOUNTER — Encounter: Payer: Self-pay | Admitting: Nurse Practitioner

## 2022-04-18 ENCOUNTER — Ambulatory Visit (INDEPENDENT_AMBULATORY_CARE_PROVIDER_SITE_OTHER): Payer: Medicare HMO | Admitting: Nurse Practitioner

## 2022-04-18 VITALS — BP 138/90 | HR 85 | Temp 96.9°F | Wt 211.0 lb

## 2022-04-18 DIAGNOSIS — R052 Subacute cough: Secondary | ICD-10-CM | POA: Diagnosis not present

## 2022-04-18 DIAGNOSIS — U071 COVID-19: Secondary | ICD-10-CM

## 2022-04-18 DIAGNOSIS — R5383 Other fatigue: Secondary | ICD-10-CM

## 2022-04-18 DIAGNOSIS — J4551 Severe persistent asthma with (acute) exacerbation: Secondary | ICD-10-CM | POA: Diagnosis not present

## 2022-04-18 DIAGNOSIS — J1282 Pneumonia due to coronavirus disease 2019: Secondary | ICD-10-CM

## 2022-04-18 DIAGNOSIS — R0981 Nasal congestion: Secondary | ICD-10-CM

## 2022-04-18 MED ORDER — DOXYCYCLINE HYCLATE 100 MG PO CAPS: ORAL_CAPSULE | ORAL | 0 refills | Status: DC

## 2022-04-18 NOTE — Patient Instructions (Signed)
How to Use a Nebulizer, Adult  A nebulizer is a device that turns liquid medicine into a mist or vapor that you can breathe in (inhale). This medicine helps to open the air passages in your lungs. You may need to use a nebulizer if you have an acute breathing illness, such as pneumonia. A nebulizer may also be used to treat chronic conditions, such as asthma or chronic obstructive pulmonarydisease (COPD). There are different kinds of nebulizers. With some nebulizers, you breathe in medicine through a mouthpiece. With others, you get medicine through a mask that fits over your nose and mouth. What are the risks? If you use a nebulizer that does not fit right or is not cleaned properly, it can cause some problems, including: Infection. Eye irritation. Delivery of too much medicine or not enough medicine. Mouth irritation. Supplies needed: Air compressor (nebulizer machine). Nebulizer medicine cup (reservoir)and tubing. Mouthpiece or face mask. Soap and water. Sterile or distilled water. Clean towel. How to use a nebulizer     Preparing a nebulizer Take these steps before using your nebulizer: Read the manufacturer's instructions for your nebulizer, as machines vary. Check your medicine. Make sure it has not expired and is not damaged in any way. Wash your hands with soap and water. Put all of the parts of your nebulizer on a sturdy, flat surface. Connect the tubing to the nebulizer machine and to the reservoir. Measure the liquid medicine according to instructions from your health care provider. Pour the liquid into the reservoir. Attach the mouthpiece or mask. Test the nebulizer by turning it on to make sure that a spray comes out. Then, turn it off. Using a nebulizer Be sure to stop the machine at any time if you start coughing or if the medicine foams or bubbles. Sit in an upright, relaxed position. If your nebulizer has a mask, put it over your nose and mouth. It should fit  somewhat snugly, with no gaps around the nose or cheeks where medicine could escape. If you use a mouthpiece, put it in your mouth. Press your lips firmly around the mouthpiece. Turn on the nebulizer. Some nebulizers have a finger valve. If yours does, cover up the air hole so the air gets to the nebulizer. Once the medicine begins to mist out, take slow, deep breaths. If there is a finger valve, release it at the end of your breath. Continue taking slow, deep breaths until the medicine in the nebulizer is gone and no mist appears. Cleaning a nebulizer The nebulizer and all of its parts must be kept very clean. If the nebulizer and its parts are not cleaned properly, bacteria can grow inside of them. If you inhale the bacteria, you can get sick. Follow the manufacturer's instructions for cleaning your nebulizer. For most nebulizers, you should follow these guidelines: Clean the mouthpiece or mask and the reservoir by: Rinsing them after each use. Use sterile or distilled water. Washing them 1-2 times a week using soap and warm water. Do not wash the tubing. After you rinse or wash them, place the parts on a clean towel and let them air-dry completely. After they dry, reconnect the pieces and turn the nebulizer on without any medicine in it. Doing this will blow air through the equipment to help dry it out. Store the nebulizer in a clean and dust-free place. Check the filter at least one time every week. Replace the filter if it looks dirty. Follow these instructions at home Use your nebulizer   only as told by your health care provider. Do not use the nebulizer more than directed by your health care provider. Do not use any products that contain nicotine or tobacco, such as cigarettes, e-cigarettes, and chewing tobacco. If you need help quitting, ask your health care provider. Keep all follow-up visits as told by your health care provider. This is important. Where to find more information Allergy &  Asthma Network: allergyasthmanetwork.org American Lung Association: www.lung.org Contact a health care provider if: You have trouble using the nebulizer. Your nebulizer foams or stops working. Your nebulizer does not create a mist after you add medicine and turn it on. Get help right away if: You continue to have trouble breathing. Your breathing gets worse during a nebulizer treatment. These symptoms may represent a serious problem that is an emergency. Do not wait to see if the symptoms will go away. Get medical help right away. Call your local emergency services (911 in the [place] Do not drive yourself to the hospital. Summary A nebulizer is a device that turns liquid medicine into a mist (vapor) that you can breathe in (inhale). Measure the liquid medicine according to instructions from your health care provider. Pour the liquid into the part of the nebulizer that holds the medicine (reservoir). Once the medicine begins to mist out, take slow, deep breaths. Rinse or wash the mouthpiece or mask and the reservoir after each use, and allow them to air-dry completely. This information is not intended to replace advice given to you by your health care provider. Make sure you discuss any questions you have with your health care provider. Document Revised: 07/03/2020 Document Reviewed: 12/01/2019 Elsevier Patient Education  2023 Elsevier Inc.  

## 2022-04-19 ENCOUNTER — Other Ambulatory Visit: Payer: Self-pay | Admitting: Podiatry

## 2022-04-21 DIAGNOSIS — R69 Illness, unspecified: Secondary | ICD-10-CM | POA: Diagnosis not present

## 2022-04-21 DIAGNOSIS — Z62820 Parent-biological child conflict: Secondary | ICD-10-CM | POA: Diagnosis not present

## 2022-04-21 DIAGNOSIS — F411 Generalized anxiety disorder: Secondary | ICD-10-CM | POA: Diagnosis not present

## 2022-04-21 DIAGNOSIS — F331 Major depressive disorder, recurrent, moderate: Secondary | ICD-10-CM | POA: Diagnosis not present

## 2022-04-21 NOTE — Progress Notes (Signed)
Assessment and Plan:  Haley White was seen today for an episodic visit.  Diagnoses and all order for this visit:  1. Pneumonia due to COVID-19 virus Continue Augmentin Start Doxycycline as recommended by most recent guidelines. Take medications in full. Antibiotic resistance discussed. Continue Prednisone taper. Report to ER or call 911 for any increase in SOB.  2. Subacute cough Stay well hydrated. Antitussive PRN.  3. Nasal congestion Stay well hydrated. Mucinex PRN  4. Other fatigue Rest.  Continue to monitor for any increase in fever, chills.  Notify office for further evaluation and treatment, questions or concerns if s/s fail to improve. The risks and benefits of my recommendations, as well as other treatment options were discussed with the patient today. Questions were answered.  Further disposition pending results of labs. Discussed med's effects and SE's.    Over 20 minutes of exam, counseling, chart review, and critical decision making was performed.   Future Appointments  Date Time Provider [place]  05/02/2022 11:30 AM Darrol Jump, NP [facility] None  06/04/2022  9:15 AM Trula Slade, DPM [facility]  07/01/2022  9:45 AM Erin Fulling, Cheryle Horsfall, MD [facility] None  08/02/2022 10:30 AM Unk Pinto, MD [facility] None  01/22/2023 10:00 AM Unk Pinto, MD [facility] None  04/24/2023 11:00 AM Darrol Jump, NP [facility] None    ------------------------------------------------------------------------------------------------------------------   HPI BP 138/90   Pulse 85   Temp (!) 96.9 F (36.1 C)   Wt 211 lb (95.7 kg)   LMP 11/26/2011   SpO2 99%   BMI 37.38 kg/m   66 y.o.female presents for follow up on Covid PNA after recently being seen in treated in the ER on 04/14/22 after testing positive for Covid 03/18/22 when returning from [place] with her daughter.  Since then she has had several symptoms of Covid that had her return  to the ER on 03/22/22 and 03/28/22.    She has a hx of asthma and has been on Symbicort the last several years. She also gets allergy shots with [facility].  During her last visit to ER, CXR was completed but did not reveal PNA.  CT scan was completed to confirm.  D-dimer was elevated.  She was negative for PE. She was discharged with Augmentin and Prednisone taper with a referral to Pulmonology.  She is continuing to take the Augmentin and Prednisone.  She had the initial visit with Pulmonology yesterday, Dr. Shearon Stalls with [name].  She is having a nebulizer machine delivered to her home.   She reports improvement today.  She reports discomfort in her chest when taking in a deep breath.  She denies wheezing.    Past Medical History:  Diagnosis Date   Anxiety    Arthritis    Asthma    Cataract    Gout    Hyperlipidemia    Hypertension    Obese      Allergies  Allergen Reactions   Molds & Smuts Anaphylaxis   Biaxin [Clarithromycin]     GI upset   Serevent [Salmeterol]     Palpitations   Tequin [Gatifloxacin]     GI upset. thrush    Current Outpatient Medications on File Prior to Visit  Medication Sig   albuterol (PROVENTIL) (2.5 MG/3ML) 0.083% nebulizer solution Take 3 mLs (2.5 mg total) by nebulization every 6 (six) hours as needed for wheezing or shortness of breath.   albuterol (PROVENTIL) (2.5 MG/3ML) 0.083% nebulizer solution 3 mL as needed   albuterol (VENTOLIN  HFA) 108 (90 Base) MCG/ACT inhaler Inhale 2 puffs into the lungs every 4 (four) hours as needed for wheezing or shortness of breath.   amoxicillin-clavulanate (AUGMENTIN) 875-125 MG tablet Take 1 tablet by mouth every 12 (twelve) hours.   Ascorbic Acid (VITAMIN C PO) Take 1 tablet by mouth.   B Complex Vitamins (VITAMIN B-COMPLEX PO) Take 1 tablet by mouth daily.   budesonide (PULMICORT) 0.25 MG/2ML nebulizer solution Take 2 mLs (0.25 mg total) by nebulization in the morning and at bedtime.    buPROPion (WELLBUTRIN XL) 300 MG 24 hr tablet Take 300 mg by mouth every morning.   Calcium Carbonate (CALCIUM 600 PO) Take by mouth daily.   dexamethasone (DECADRON) 4 MG tablet Take 1 tab 3 x day - 3 days, then 2 x day - 3 days, then 1 tab daily   diphenhydrAMINE (BENADRYL) 25 MG tablet Take 1 tablet (25 mg total) by mouth at bedtime as needed for up to 12 doses for sleep (Headache). Take with reglan as needed at bedtime to help with headache and sleep   EPINEPHrine 0.3 mg/0.3 mL IJ SOAJ injection See admin instructions.   famotidine (PEPCID) 40 MG tablet Take  1 tablet  at Night to Prevent Heartburn & Acid Reflux   fexofenadine (ALLEGRA) 180 MG tablet 1 tablet   fluconazole (DIFLUCAN) 100 MG tablet Take  1 tablet  Daily  for Oral Yeast infection   FLUoxetine (PROZAC) 20 MG capsule Take 1 capsule (20 mg total) by mouth daily. For depression   fluticasone (FLONASE) 50 MCG/ACT nasal spray SHAKE LQ AND U 1 TO 2 SPRAYS IEN QD   gabapentin (NEURONTIN) 300 MG capsule Take 1 capsule  3 x /day  for Shingles Neuritis   levocetirizine (XYZAL) 5 MG tablet TK 1 T PO QD IN THE EVE   levocetirizine (XYZAL) 5 MG tablet every evening.   lisinopril (ZESTRIL) 20 MG tablet TAKE 1 TABLET BY MOUTH DAILY FOR BLOOD PRESSURE   magic mouthwash (nystatin, hydrocortisone, diphenhydrAMINE) suspension Take  1 teaspoon (5 ml)   Swish & Swallow  every 2 hours for mouth ulcers   methylphenidate (RITALIN) 10 MG tablet TK 1 T PO BID   metoCLOPramide (REGLAN) 10 MG tablet Take 1 tablet (10 mg total) by mouth every 12 (twelve) hours as needed for up to 12 doses for nausea.   montelukast (SINGULAIR) 10 MG tablet 1 tablet   ondansetron (ZOFRAN-ODT) 4 MG disintegrating tablet ondansetron 4 mg disintegrating tablet   pantoprazole (PROTONIX) 40 MG tablet Take  1 tablet  every Morning to Prevent Heartburn & Acid Reflux   promethazine-dextromethorphan (PROMETHAZINE-DM) 6.25-15 MG/5ML syrup Take 5 mL as needed at bedtime for severe cough    rosuvastatin (CRESTOR) 40 MG tablet TAKE 1 TABLET BY MOUTH DAILY FOR CHOLESTEROL   SYMBICORT 160-4.5 MCG/ACT inhaler INL 2 PFS PO BID   valACYclovir (VALTREX) 1000 MG tablet Take 1 tablet 3 x day for Shingles   VITAMIN D PO Take 15,000 Units by mouth daily.   potassium chloride (KLOR-CON) 10 MEQ tablet Take 1 tablet (10 mEq total) by mouth daily for 15 days.   No current facility-administered medications on file prior to visit.    ROS: all negative except what is noted in the HPI.   Physical Exam:  BP 138/90   Pulse 85   Temp (!) 96.9 F (36.1 C)   Wt 211 lb (95.7 kg)   LMP 11/26/2011   SpO2 99%   BMI 37.38 kg/m  General Appearance: NAD.  Awake, conversant and cooperative. Eyes: PERRLA, EOMs intact.  Sclera white.  Conjunctiva without erythema. Sinuses: No frontal/maxillary tenderness.  No nasal discharge. Nares patent.  ENT/Mouth: Ext aud canals clear.  Bilateral TMs w/DOL and without erythema or bulging. Hearing intact.  Posterior pharynx without swelling or exudate.  Tonsils without swelling or erythema.  Neck: Supple.  No masses, nodules or thyromegaly. Respiratory: Effort is regular with non-labored breathing. Breath sounds are equal bilaterally without rales, rhonchi, wheezing or stridor.  Cardio: RRR with no MRGs. Brisk peripheral pulses without edema.  Abdomen: Active BS in all four quadrants.  Soft and non-tender without guarding, rebound tenderness, hernias or masses. Lymphatics: Non tender without lymphadenopathy.  Musculoskeletal: Full ROM, 5/5 strength, normal ambulation.  No clubbing or cyanosis. Skin: Appropriate color for ethnicity. Warm without rashes, lesions, ecchymosis, ulcers.  Neuro: CN II-XII grossly normal. Normal muscle tone without cerebellar symptoms and intact sensation.   Psych: AO X 3,  appropriate mood and affect, insight and judgment.     Darrol Jump, NP 9:50 PM [facility]

## 2022-04-22 DIAGNOSIS — E669 Obesity, unspecified: Secondary | ICD-10-CM | POA: Diagnosis not present

## 2022-04-22 DIAGNOSIS — Z6837 Body mass index (BMI) 37.0-37.9, adult: Secondary | ICD-10-CM | POA: Diagnosis not present

## 2022-04-22 DIAGNOSIS — M25562 Pain in left knee: Secondary | ICD-10-CM | POA: Diagnosis not present

## 2022-04-22 DIAGNOSIS — G8929 Other chronic pain: Secondary | ICD-10-CM | POA: Diagnosis not present

## 2022-04-28 DIAGNOSIS — R69 Illness, unspecified: Secondary | ICD-10-CM | POA: Diagnosis not present

## 2022-04-28 DIAGNOSIS — Z62820 Parent-biological child conflict: Secondary | ICD-10-CM | POA: Diagnosis not present

## 2022-04-28 DIAGNOSIS — F411 Generalized anxiety disorder: Secondary | ICD-10-CM | POA: Diagnosis not present

## 2022-04-28 DIAGNOSIS — F331 Major depressive disorder, recurrent, moderate: Secondary | ICD-10-CM | POA: Diagnosis not present

## 2022-04-30 ENCOUNTER — Other Ambulatory Visit: Payer: Self-pay | Admitting: Adult Health

## 2022-05-02 ENCOUNTER — Ambulatory Visit
Admission: RE | Admit: 2022-05-02 | Discharge: 2022-05-02 | Disposition: A | Discharge status: Home / Self Care | Payer: Medicare HMO | Source: Ambulatory Visit | Attending: Nurse Practitioner | Admitting: Nurse Practitioner

## 2022-05-02 ENCOUNTER — Ambulatory Visit (INDEPENDENT_AMBULATORY_CARE_PROVIDER_SITE_OTHER): Payer: Medicare HMO | Admitting: Nurse Practitioner

## 2022-05-02 ENCOUNTER — Encounter: Payer: Self-pay | Admitting: Nurse Practitioner

## 2022-05-02 VITALS — BP 110/76 | HR 93 | Temp 97.7°F | Wt 215.0 lb

## 2022-05-02 DIAGNOSIS — J1282 Pneumonia due to coronavirus disease 2019: Secondary | ICD-10-CM

## 2022-05-02 DIAGNOSIS — R0609 Other forms of dyspnea: Secondary | ICD-10-CM

## 2022-05-02 DIAGNOSIS — R052 Subacute cough: Secondary | ICD-10-CM | POA: Diagnosis not present

## 2022-05-02 DIAGNOSIS — R5383 Other fatigue: Secondary | ICD-10-CM

## 2022-05-02 DIAGNOSIS — R0602 Shortness of breath: Secondary | ICD-10-CM | POA: Diagnosis not present

## 2022-05-02 DIAGNOSIS — U099 Post covid-19 condition, unspecified: Secondary | ICD-10-CM

## 2022-05-02 DIAGNOSIS — R0981 Nasal congestion: Secondary | ICD-10-CM

## 2022-05-02 DIAGNOSIS — U071 COVID-19: Secondary | ICD-10-CM | POA: Diagnosis not present

## 2022-05-02 DIAGNOSIS — R059 Cough, unspecified: Secondary | ICD-10-CM | POA: Diagnosis not present

## 2022-05-02 NOTE — Patient Instructions (Signed)
Acute Respiratory Distress Syndrome, Adult  Acute respiratory distress syndrome (ARDS) is a life-threatening condition. The lungs become swollen and blood vessels leak fluid into the air sacs (alveoli). This prevents the lungs from working well. It also makes it hard to get oxygen into the blood. This can damage other vital organs, such as the heart, kidneys, liver, or brain. You will need intensive care in a hospital. What are the causes? The condition usually develops as a result of a major illness, surgery, injury, or widespread infection (sepsis). Common causes of the condition include: An infection in the blood or lungs. A serious injury to the chest. A serious injury that causes low blood pressure. A major surgery. Breathing in harmful chemicals, smoke, or water. Blood transfusions. Breathing in vomit (aspiration). Inflammation of the pancreas (pancreatitis). What are the signs or symptoms? The main symptoms of this condition are sudden shortness of breath and rapid shallow breathing. Other symptoms may include: A fast or irregular heartbeat. Crackles in the lungs. Skin, lips, or fingernails that look blue (cyanosis). Confusion. Tiredness or loss of energy. Chest pain, particularly while taking a breath. Coughing. Restlessness or anxiety. Fever. This is usually present if there is an underlying infection, such as pneumonia. How is this diagnosed? This condition is diagnosed based on: Medical history, including your symptoms. Physical exam. You may also have other tests, including: A test that measures the percentage of oxygen in your blood (pulse oximetry). This is done with a sensor that is placed on your finger, toe, or earlobe. Blood tests. Chest X-rays or CT scans to look for fluid in the lungs. You may also have tests to rule out other conditions, including: Taking a sample of your sputum to test for infection. Heart tests, such as an echocardiogram or electrocardiogram.  These tests check for blood flow and the proper functioning of the heart. Taking a sample of your urine to test for infection or kidney problems. Bronchoscopy. During this test, a thin, flexible tube with a light is passed into the mouth or nose, down the windpipe, and into the lungs. How is this treated? The treatment goal is to support you while your lungs heal and the underlying cause is treated. Treatment may include: Oxygen therapy. Noninvasive ventilation. A device such as a continuous positive airway pressure (CPAP) or bi-level positive airway pressure (BIPAP) machine may be used to help you breathe. The device gives you oxygen and pressure through a mask or helmet. Positioning you to lie on your stomach (prone position). A breathing machine (ventilator) may be used to help you breathe. This device gives oxygen and pressure through a tube that is put through your mouth into the windpipe (trachea) to help you breathe. If this treatment is needed longer term, a tracheostomy may be placed. A tracheostomy is a breathing tube put through your neck directly into the trachea. Fluid and medicine is given through an IV. Medicines may be given to: Help you relax (sedatives). Treat blood pressure. Treat infection (antibiotics). Prevent blood clots (anticoagulants). Help get rid of excess fluid (diuretics). In severe cases, extracorporeal life support (ECLS) may be used. This treatment temporarily takes over the function of the heart and lungs, supplying oxygen and removing carbon dioxide. ECLS gives the lungs a chance to rest and recover. Follow these instructions at home: Medicines Take over-the-counter and prescription medicines only as told by your health care provider. If you were prescribed an antibiotic medicine, take it as told by your health care provider. Do not  stop using the antibiotic even if you start to feel better. If you are taking blood thinners: Talk with your health care  provider before you take any medicines that contain aspirin or NSAIDs, such as ibuprofen. These medicines increase your risk for dangerous bleeding. Take your medicine exactly as told, at the same time every day. Avoid activities that could cause injury or bruising, and follow instructions about how to prevent falls. Wear a medical alert bracelet or carry a card that lists what medicines you take. Lifestyle  Do not use any products that contain nicotine or tobacco, such as cigarettes, e-cigarettes, and chewing tobacco. If you need help quitting, ask your health care provider. Do not drink alcohol if your health care provider tells you not to drink. Rest and return to your normal activities as told by your health care provider. Ask your health care provider what activities are safe for you. It may take a while for you to get back to your normal activities and routine. Ask friends and family to help you if daily activities make you tired. Attend any physical or pulmonary rehabilitation as told by your health care provider. This can help with muscle weakness and shortness of breath. You may feel depressed, anxious or have trouble with memory and concentration. Talk with your health care provider about ways to help you feel better. General instructions Wear compression stockings as told by your health care provider. These stockings help to prevent blood clots and reduce swelling in your legs. Keep all follow-up visits as told by your health care provider. This is important. Where to find more information American Lung Association: www.lung.org ARDS Foundation: http://www.richard-flynn.net/ National Institutes of Health: https://wilson-eaton.com/ Contact a health care provider if: You become short of breath during activity or while resting. You develop a cough that does not go away. You have a fever. Your symptoms do not get better or they get worse. You become anxious or depressed. Get help right away if: You have  trouble breathing. You develop a rapid heart rate. You develop sudden chest pain that does not go away. You develop swelling or pain in one of your legs. You cough up blood. Your skin, lips, or fingernails turn blue. These symptoms may represent a serious problem that is an emergency. Do not wait to see if the symptoms will go away. Get medical help right away. Call your local emergency services (911 in the [place] Do not drive yourself to the hospital. Summary Acute respiratory distress syndrome is a life-threatening condition in which fluid collects in the lungs. This can cause the lungs and other vital organs to fail. The condition usually develops following a major illness, surgery, injury or widespread infection (sepsis). If you were prescribed an antibiotic medicine, take it as told by your health care provider. Do not stop using the antibiotic even if you start to feel better. Do not use any products that contain nicotine or tobacco, such as cigarettes, e-cigarettes, and chewing tobacco. If you need help quitting, ask your health care provider. This information is not intended to replace advice given to you by your health care provider. Make sure you discuss any questions you have with your health care provider. Document Revised: 05/30/2021 Document Reviewed: 11/18/2019 Elsevier Patient Education  [place].

## 2022-05-02 NOTE — Progress Notes (Signed)
Assessment and Plan:  Haley White was seen today for a follow up visit.  Diagnoses and all order for this visit:  Long Covid Rest. Stay well hydrated. Continue inhalers, nebulizer's Report to ER or call 911 for any increase in SOB  -CXR  Pneumonia due to COVID-19 virus Follow up with Pulmonology sooner than 06/2022 Staff to reach out to re-schedule apt. Report to ER or call 911 for any increase in SOB.  -CXR  Subacute cough Stay well hydrated. Antitussive PRN.  Nasal congestion Stay well hydrated. Mucinex PRN  Other fatigue Rest.  DOE Continue inhalers  Report to ER or call 911 for any increase in SOB Possible CT Scan and D-Dimer if X-ray is Negative to r/o any PE.    Orders Placed This Encounter  Procedures   DG Chest 2 View    Standing Status:   Future    Standing Expiration Date:   05/03/2023    Order Specific Question:   Reason for Exam (SYMPTOM  OR DIAGNOSIS REQUIRED)    Answer:   Covid PNA, Long covid symptoms, DOE    Order Specific Question:   Preferred imaging location?    Answer:   [facility]   Continue to monitor for any increase in fever, chills.  Notify office for further evaluation and treatment, questions or concerns if s/s fail to improve. The risks and benefits of my recommendations, as well as other treatment options were discussed with the patient today. Questions were answered.  Further disposition pending results of labs. Discussed med's effects and SE's.    Over 20 minutes of exam, counseling, chart review, and critical decision making was performed.   Future Appointments  Date Time Provider [place]  06/04/2022  9:15 AM Trula Slade, DPM [facility]  07/01/2022  9:45 AM Erin Fulling, Cheryle Horsfall, MD [facility] None  08/02/2022 10:30 AM Unk Pinto, MD [facility] None  01/22/2023 10:00 AM Unk Pinto, MD [facility] None  04/24/2023 11:00 AM Darrol Jump, NP [facility] None     ------------------------------------------------------------------------------------------------------------------   HPI BP 110/76   Pulse 93   Temp 97.7 F (36.5 C)   Wt 215 lb (97.5 kg)   LMP 11/26/2011   SpO2 99%   BMI 38.09 kg/m   66 y.o.female presents for follow up on Covid PNA after recently being seen in treated in the ER on 04/14/22.   Haley White followed up with Pulmonology 04/17/22 and was provided a nebulizer.  Haley White has been using inhalers, Symbicort and Albuterol, along with nebulizer treatments with minimal relief.   Haley White has completed full antibiotic treatment of  Augmentin with Doxycycline, along with Prednisone taper.  Haley White is continuing to have SOB and DOE.    Haley White has a hx of asthma and has been on Symbicort the last several years. Haley White also gets allergy shots with [facility].  Past Medical History:  Diagnosis Date   Anxiety    Arthritis    Asthma    Cataract    Gout    Hyperlipidemia    Hypertension    Obese      Allergies  Allergen Reactions   Molds & Smuts Anaphylaxis   Biaxin [Clarithromycin]     GI upset   Serevent [Salmeterol]     Palpitations   Tequin [Gatifloxacin]     GI upset. thrush    Current Outpatient Medications on File Prior to Visit  Medication Sig   albuterol (PROVENTIL) (2.5 MG/3ML) 0.083% nebulizer solution Take 3 mLs (2.5 mg total)  by nebulization every 6 (six) hours as needed for wheezing or shortness of breath.   albuterol (PROVENTIL) (2.5 MG/3ML) 0.083% nebulizer solution 3 mL as needed   albuterol (VENTOLIN HFA) 108 (90 Base) MCG/ACT inhaler Inhale 2 puffs into the lungs every 4 (four) hours as needed for wheezing or shortness of breath.   Ascorbic Acid (VITAMIN C PO) Take 1 tablet by mouth.   B Complex Vitamins (VITAMIN B-COMPLEX PO) Take 1 tablet by mouth daily.   budesonide (PULMICORT) 0.25 MG/2ML nebulizer solution Take 2 mLs (0.25 mg total) by nebulization in the morning and at bedtime.   buPROPion (WELLBUTRIN XL) 300 MG 24  hr tablet Take 300 mg by mouth every morning.   Calcium Carbonate (CALCIUM 600 PO) Take by mouth daily.   celecoxib (CELEBREX) 100 MG capsule TAKE 1 CAPSULE(100 MG) BY MOUTH TWICE DAILY   diphenhydrAMINE (BENADRYL) 25 MG tablet Take 1 tablet (25 mg total) by mouth at bedtime as needed for up to 12 doses for sleep (Headache). Take with reglan as needed at bedtime to help with headache and sleep   EPINEPHrine 0.3 mg/0.3 mL IJ SOAJ injection See admin instructions.   famotidine (PEPCID) 40 MG tablet Take  1 tablet  at Night to Prevent Heartburn & Acid Reflux   fexofenadine (ALLEGRA) 180 MG tablet 1 tablet   FLUoxetine (PROZAC) 20 MG capsule Take 1 capsule (20 mg total) by mouth daily. For depression   fluticasone (FLONASE) 50 MCG/ACT nasal spray SHAKE LQ AND U 1 TO 2 SPRAYS IEN QD   gabapentin (NEURONTIN) 300 MG capsule Take 1 capsule  3 x /day  for Shingles Neuritis   levocetirizine (XYZAL) 5 MG tablet every evening.   lisinopril (ZESTRIL) 20 MG tablet TAKE 1 TABLET BY MOUTH DAILY FOR BLOOD PRESSURE   methylphenidate (RITALIN) 10 MG tablet TK 1 T PO BID   montelukast (SINGULAIR) 10 MG tablet 1 tablet   ondansetron (ZOFRAN-ODT) 4 MG disintegrating tablet ondansetron 4 mg disintegrating tablet   pantoprazole (PROTONIX) 40 MG tablet Take  1 tablet  every Morning to Prevent Heartburn & Acid Reflux   promethazine-dextromethorphan (PROMETHAZINE-DM) 6.25-15 MG/5ML syrup Take 5 mL as needed at bedtime for severe cough   rosuvastatin (CRESTOR) 40 MG tablet TAKE 1 TABLET BY MOUTH DAILY FOR CHOLESTEROL   SYMBICORT 160-4.5 MCG/ACT inhaler INL 2 PFS PO BID   VITAMIN D PO Take 15,000 Units by mouth daily.   amoxicillin-clavulanate (AUGMENTIN) 875-125 MG tablet Take 1 tablet by mouth every 12 (twelve) hours.   dexamethasone (DECADRON) 4 MG tablet Take 1 tab 3 x day - 3 days, then 2 x day - 3 days, then 1 tab daily   doxycycline (VIBRAMYCIN) 100 MG capsule Take 1 capsule 2 x /day with meals for Infection    fluconazole (DIFLUCAN) 100 MG tablet Take  1 tablet  Daily  for Oral Yeast infection   levocetirizine (XYZAL) 5 MG tablet TK 1 T PO QD IN THE EVE   magic mouthwash (nystatin, hydrocortisone, diphenhydrAMINE) suspension Take  1 teaspoon (5 ml)   Swish & Swallow  every 2 hours for mouth ulcers   metoCLOPramide (REGLAN) 10 MG tablet Take 1 tablet (10 mg total) by mouth every 12 (twelve) hours as needed for up to 12 doses for nausea.   potassium chloride (KLOR-CON) 10 MEQ tablet Take 1 tablet (10 mEq total) by mouth daily for 15 days.   valACYclovir (VALTREX) 1000 MG tablet Take 1 tablet 3 x day for Shingles  No current facility-administered medications on file prior to visit.    ROS: all negative except what is noted in the HPI.   Physical Exam:  BP 110/76   Pulse 93   Temp 97.7 F (36.5 C)   Wt 215 lb (97.5 kg)   LMP 11/26/2011   SpO2 99%   BMI 38.09 kg/m   General Appearance: NAD.  Awake, conversant and cooperative. Eyes: PERRLA, EOMs intact.  Sclera white.  Conjunctiva without erythema. Sinuses: No frontal/maxillary tenderness.  No nasal discharge. Nares patent.  ENT/Mouth: Ext aud canals clear.  Bilateral TMs w/DOL and without erythema or bulging. Hearing intact.  Posterior pharynx without swelling or exudate.  Tonsils without swelling or erythema.  Neck: Supple.  No masses, nodules or thyromegaly. Respiratory: Coarse breath sounds throughout entire lung filed.  Effort is regular with non-labored breathing. Breath sounds are equal bilaterally without rales, rhonchi, wheezing or stridor.  Cardio: RRR with no MRGs. Brisk peripheral pulses without edema.  Abdomen: Active BS in all four quadrants.  Soft and non-tender without guarding, rebound tenderness, hernias or masses. Lymphatics: Non tender without lymphadenopathy.  Musculoskeletal: Full ROM, 5/5 strength, normal ambulation.  No clubbing or cyanosis. Skin: Appropriate color for ethnicity. Warm without rashes, lesions,  ecchymosis, ulcers.  Neuro: CN II-XII grossly normal. Normal muscle tone without cerebellar symptoms and intact sensation.   Psych: AO X 3,  appropriate mood and affect, insight and judgment.     Darrol Jump, NP 11:54 AM [facility]

## 2022-05-05 DIAGNOSIS — F411 Generalized anxiety disorder: Secondary | ICD-10-CM | POA: Diagnosis not present

## 2022-05-05 DIAGNOSIS — Z62820 Parent-biological child conflict: Secondary | ICD-10-CM | POA: Diagnosis not present

## 2022-05-05 DIAGNOSIS — F331 Major depressive disorder, recurrent, moderate: Secondary | ICD-10-CM | POA: Diagnosis not present

## 2022-05-05 DIAGNOSIS — R69 Illness, unspecified: Secondary | ICD-10-CM | POA: Diagnosis not present

## 2022-05-10 ENCOUNTER — Telehealth: Payer: Self-pay | Admitting: Internal Medicine

## 2022-05-10 ENCOUNTER — Ambulatory Visit: Payer: Medicare HMO | Admitting: Nurse Practitioner

## 2022-05-10 ENCOUNTER — Observation Stay (HOSPITAL_COMMUNITY)
Admission: RE | Admit: 2022-05-10 | Discharge: 2022-05-10 | Disposition: A | Discharge status: Home / Self Care | Payer: Medicare HMO | Source: Ambulatory Visit | Attending: Nurse Practitioner | Admitting: Nurse Practitioner

## 2022-05-10 ENCOUNTER — Other Ambulatory Visit: Payer: Self-pay

## 2022-05-10 ENCOUNTER — Encounter (HOSPITAL_BASED_OUTPATIENT_CLINIC_OR_DEPARTMENT_OTHER): Payer: Self-pay

## 2022-05-10 ENCOUNTER — Observation Stay (HOSPITAL_BASED_OUTPATIENT_CLINIC_OR_DEPARTMENT_OTHER)
Admission: EM | Admit: 2022-05-10 | Discharge: 2022-05-12 | Disposition: A | Discharge status: Home / Self Care | Payer: Medicare HMO | Attending: Internal Medicine | Admitting: Internal Medicine

## 2022-05-10 ENCOUNTER — Encounter: Payer: Self-pay | Admitting: Nurse Practitioner

## 2022-05-10 VITALS — BP 132/80 | HR 108 | Ht 63.0 in | Wt 217.4 lb

## 2022-05-10 DIAGNOSIS — I2609 Other pulmonary embolism with acute cor pulmonale: Principal | ICD-10-CM | POA: Insufficient documentation

## 2022-05-10 DIAGNOSIS — I10 Essential (primary) hypertension: Secondary | ICD-10-CM | POA: Diagnosis not present

## 2022-05-10 DIAGNOSIS — R0602 Shortness of breath: Secondary | ICD-10-CM

## 2022-05-10 DIAGNOSIS — I2699 Other pulmonary embolism without acute cor pulmonale: Secondary | ICD-10-CM | POA: Diagnosis not present

## 2022-05-10 DIAGNOSIS — U071 COVID-19: Secondary | ICD-10-CM | POA: Insufficient documentation

## 2022-05-10 DIAGNOSIS — Z8616 Personal history of COVID-19: Secondary | ICD-10-CM | POA: Insufficient documentation

## 2022-05-10 DIAGNOSIS — J3089 Other allergic rhinitis: Secondary | ICD-10-CM

## 2022-05-10 DIAGNOSIS — Z79899 Other long term (current) drug therapy: Secondary | ICD-10-CM | POA: Insufficient documentation

## 2022-05-10 DIAGNOSIS — J45909 Unspecified asthma, uncomplicated: Secondary | ICD-10-CM | POA: Diagnosis not present

## 2022-05-10 DIAGNOSIS — R0609 Other forms of dyspnea: Secondary | ICD-10-CM | POA: Diagnosis not present

## 2022-05-10 DIAGNOSIS — Z87891 Personal history of nicotine dependence: Secondary | ICD-10-CM | POA: Insufficient documentation

## 2022-05-10 DIAGNOSIS — I2693 Single subsegmental pulmonary embolism without acute cor pulmonale: Secondary | ICD-10-CM | POA: Diagnosis not present

## 2022-05-10 LAB — CBC
HCT: 34.4 % — ABNORMAL LOW (ref 36.0–46.0)
Hemoglobin: 11.5 g/dL — ABNORMAL LOW (ref 12.0–15.0)
MCH: 31.9 pg (ref 26.0–34.0)
MCHC: 33.4 g/dL (ref 30.0–36.0)
MCV: 95.3 fL (ref 80.0–100.0)
Platelets: 289 10*3/uL (ref 150–400)
RBC: 3.61 MIL/uL — ABNORMAL LOW (ref 3.87–5.11)
RDW: 14.4 % (ref 11.5–15.5)
WBC: 7.2 10*3/uL (ref 4.0–10.5)
nRBC: 0 % (ref 0.0–0.2)

## 2022-05-10 LAB — LACTIC ACID, PLASMA: Lactic Acid, Venous: 1.6 mmol/L (ref 0.5–1.9)

## 2022-05-10 LAB — BRAIN NATRIURETIC PEPTIDE: B Natriuretic Peptide: 20.2 pg/mL (ref 0.0–100.0)

## 2022-05-10 LAB — BASIC METABOLIC PANEL
Anion gap: 13 (ref 5–15)
BUN: 12 mg/dL (ref 8–23)
CO2: 23 mmol/L (ref 22–32)
Calcium: 9.6 mg/dL (ref 8.9–10.3)
Chloride: 108 mmol/L (ref 98–111)
Creatinine, Ser: 0.92 mg/dL (ref 0.44–1.00)
GFR, Estimated: >60 mL/min (ref >60)
Glucose, Bld: 106 mg/dL — ABNORMAL HIGH (ref 70–99)
Potassium: 4 mmol/L (ref 3.5–5.1)
Sodium: 144 mmol/L (ref 135–145)

## 2022-05-10 LAB — PROTIME-INR
INR: 0.9 (ref 0.8–1.2)
Prothrombin Time: 12.5 seconds (ref 11.4–15.2)

## 2022-05-10 LAB — POCT EXHALED NITRIC OXIDE: FeNO level (ppb): 10

## 2022-05-10 LAB — TROPONIN I (HIGH SENSITIVITY): Troponin I (High Sensitivity): 3 ng/L (ref <18)

## 2022-05-10 LAB — APTT: aPTT: 27 seconds (ref 24–36)

## 2022-05-10 MED ORDER — IOHEXOL 350 MG/ML SOLN
80.0000 mL | Freq: Once | INTRAVENOUS | Status: AC | PRN
  Administered 2022-05-10: 80 mL via INTRAVENOUS

## 2022-05-10 MED ORDER — IOHEXOL 300 MG/ML  SOLN: 100.0000 mL | Freq: Once | INTRAMUSCULAR | Status: DC | PRN

## 2022-05-10 MED ORDER — BREZTRI AEROSPHERE 160-9-4.8 MCG/ACT IN AERO: 2.0000 | INHALATION_SPRAY | Freq: Two times a day (BID) | RESPIRATORY_TRACT | 0 refills | Status: DC

## 2022-05-10 MED ORDER — HEPARIN BOLUS VIA INFUSION
4500.0000 [IU] | Freq: Once | INTRAVENOUS | Status: AC
  Administered 2022-05-10: 4500 [IU] via INTRAVENOUS

## 2022-05-10 MED ORDER — HEPARIN (PORCINE) 25000 UT/250ML-% IV SOLN
1500.0000 [IU]/h | INTRAVENOUS | Status: AC
  Administered 2022-05-10: 1250 [IU]/h via INTRAVENOUS

## 2022-05-10 NOTE — Telephone Encounter (Signed)
Patient seen earlier today in the office for ongoing dyspnea following covid 19 infection. Failed repeat abx and steroids. Repeat CTPE study performed today. Was negative 2 weeks ago. Today GGOs have cleared from covid and she has Acute PE without right heart strain. Called patient to notify her of results.   Reviewed Hestia criteria for outpatient PE management -  based on vitals in clinic today score is 1 which means she is not low risk and not appropriate for outpatient PE treatment. Recommend she go to the ED to be evaluated - will require anticoagulation initiation and possibly monitoring in Obs.   Called patient and sister was unable to get through. Was able to reach daughter Haley White who is taking her to the ED.   Lenice Llamas, MD Pulmonary and [place] 05/10/2022 6:25 PM Pager: see AMION  If no response to pager, please call critical care on call (see AMION) until 7pm After 7:00 pm call Elink

## 2022-05-10 NOTE — Patient Instructions (Addendum)
Stop Symbicort. Trial Breztri 2 puffs twice daily. Brush tongue and rinse mouth afterwards. If no improvement in your breathing after 2 weeks of use, you can go back to  your Symbicort  Continue albuterol 2 puffs or 106m neb every 6 hours as needed for shortness of breath or wheezing Continue budesonide 2 ml twice daily. Brush tongue, rinse mouth afterwards Continue allegra 180 mg daily for allergies Continue flonase nasal spray 2 sprays each nostril daily Continue xyzal 1 tab daily for allergies Continue singulair 10 mg at bedtime  CTA chest today   Pulmonary function testing scheduled   Follow up after PFTs with Dr. DShearon Stalls If symptoms do not improve or worsen, please contact office for sooner follow up or seek emergency care.

## 2022-05-10 NOTE — Telephone Encounter (Signed)
Spoke with patient as well and advised her of the same recommendations. Nothing further. Thanks!

## 2022-05-10 NOTE — Assessment & Plan Note (Signed)
Well-controlled on current regimen.  Continue Xyzal, Allegra, Singulair and Flonase.

## 2022-05-10 NOTE — Assessment & Plan Note (Signed)
Her asthma appears overall compensated.  No evidence of bronchospasm on exam.  I was concerned that her cough could be related to asthmatic bronchitis; however, FeNO was normal and lung exam was unremarkable.  She has not had much of a response to prednisone the last few times either.  Recommended that we hold off on any further steroids at this time.  Advised her to continue Symbicort twice daily, budesonide twice daily and albuterol every 6 hours as needed.

## 2022-05-10 NOTE — Progress Notes (Signed)
RNCM received TOC consult for cost of apixaban or rivaroxaban. This RNCM spoke with Monty at [facility] on [address] who advised both medications for 30 day supply will be $47 Part D copay. Patient is being admitted. No TOC needs at this time.

## 2022-05-10 NOTE — ED Provider Notes (Signed)
[place] EMERGENCY DEPT Provider Note   CSN: [id number] Arrival date & time: 05/10/22  1902     History  Chief Complaint  Patient presents with   Shortness of Breath    Haley White is a 66 y.o. female.  [facility]ed to the emerge apartment due to concern for abnormal CT scan.  Patient reports that she has been dealing with shortness of breath ever since having COVID in May.  Over the past couple weeks she feels like her shortness of breath has worsened.  She denies any chest pain but does at times have some slight discomfort.  States that the breathing gets worse with exertion and is improved with rest.  Also gets short of breath when speaking for long duration.  No fevers or chills.  HPI     Home Medications Prior to Admission medications   Medication Sig Start Date End Date Taking? Authorizing Provider  albuterol (PROVENTIL) (2.5 MG/3ML) 0.083% nebulizer solution Take 3 mLs (2.5 mg total) by nebulization every 6 (six) hours as needed for wheezing or shortness of breath. 04/17/22   Spero Geralds, MD  albuterol (PROVENTIL) (2.5 MG/3ML) 0.083% nebulizer solution 3 mL as needed 03/21/22   [provider]  albuterol (VENTOLIN HFA) 108 (90 Base) MCG/ACT inhaler Inhale 2 puffs into the lungs every 4 (four) hours as needed for wheezing or shortness of breath. 07/17/17   Liane Comber, NP  Ascorbic Acid (VITAMIN C PO) Take 1 tablet by mouth.    [provider]  B Complex Vitamins (VITAMIN B-COMPLEX PO) Take 1 tablet by mouth daily.    [provider]  Budeson-Glycopyrrol-Formoterol (BREZTRI AEROSPHERE) 160-9-4.8 MCG/ACT AERO Inhale 2 puffs into the lungs in the morning and at bedtime. 05/10/22   Cobb, Karie Schwalbe, NP  budesonide (PULMICORT) 0.25 MG/2ML nebulizer solution Take 2 mLs (0.25 mg total) by nebulization in the morning and at bedtime. 04/17/22   Spero Geralds, MD  buPROPion (WELLBUTRIN XL) 300 MG 24 hr tablet Take 300 mg by mouth every  morning. 12/17/20   [provider]  Calcium Carbonate (CALCIUM 600 PO) Take by mouth daily.    [provider]  celecoxib (CELEBREX) 100 MG capsule TAKE 1 CAPSULE(100 MG) BY MOUTH TWICE DAILY 04/19/22   Trula Slade, DPM  diphenhydrAMINE (BENADRYL) 25 MG tablet Take 1 tablet (25 mg total) by mouth at bedtime as needed for up to 12 doses for sleep (Headache). Take with reglan as needed at bedtime to help with headache and sleep 03/22/22   Wyvonnia Dusky, MD  EPINEPHrine 0.3 mg/0.3 mL IJ SOAJ injection See admin instructions. 02/08/20   [provider]  famotidine (PEPCID) 40 MG tablet Take  1 tablet  at Night to Prevent Heartburn & Acid Reflux 04/02/22   Unk Pinto, MD  fexofenadine (ALLEGRA) 180 MG tablet 1 tablet    [provider]  fluconazole (DIFLUCAN) 100 MG tablet Take  1 tablet  Daily  for Oral Yeast infection 04/10/22   Unk Pinto, MD  FLUoxetine (PROZAC) 20 MG capsule Take 1 capsule (20 mg total) by mouth daily. For depression 05/08/15   Lindell Spar I, NP  fluticasone (FLONASE) 50 MCG/ACT nasal spray SHAKE LQ AND U 1 TO 2 SPRAYS IEN QD 10/17/17   [provider]  gabapentin (NEURONTIN) 300 MG capsule Take 1 capsule  3 x /day  for Shingles Neuritis 03/30/22   Unk Pinto, MD  levocetirizine (XYZAL) 5 MG tablet TK 1 T PO QD  IN THE EVE 12/07/17   [provider]  levocetirizine (XYZAL) 5 MG tablet every evening.    [provider]  lisinopril (ZESTRIL) 20 MG tablet TAKE 1 TABLET BY MOUTH DAILY FOR BLOOD PRESSURE 12/01/21   Alycia Rossetti, NP  methylphenidate (RITALIN) 10 MG tablet TK 1 T PO BID 01/16/18   [provider]  metoCLOPramide (REGLAN) 10 MG tablet Take 1 tablet (10 mg total) by mouth every 12 (twelve) hours as needed for up to 12 doses for nausea. 03/22/22   Wyvonnia Dusky, MD  montelukast (SINGULAIR) 10 MG tablet 1 tablet    [provider]  ondansetron (ZOFRAN-ODT) 4 MG  disintegrating tablet ondansetron 4 mg disintegrating tablet    [provider]  pantoprazole (PROTONIX) 40 MG tablet Take  1 tablet  every Morning to Prevent Heartburn & Acid Reflux 04/02/22   Unk Pinto, MD  potassium chloride (KLOR-CON) 10 MEQ tablet Take 1 tablet (10 mEq total) by mouth daily for 15 days. 03/22/22 04/06/22  Wyvonnia Dusky, MD  promethazine-dextromethorphan (PROMETHAZINE-DM) 6.25-15 MG/5ML syrup Take 5 mL as needed at bedtime for severe cough Patient not taking: Reported on 05/10/2022 03/18/22   Darrol Jump, NP  rosuvastatin (CRESTOR) 40 MG tablet TAKE 1 TABLET BY MOUTH DAILY FOR CHOLESTEROL 04/30/22   Liane Comber, NP  SYMBICORT 160-4.5 MCG/ACT inhaler INL 2 PFS PO BID 01/16/18   [provider]  VITAMIN D PO Take 15,000 Units by mouth daily.    [provider]      Allergies    Molds & smuts, Biaxin [clarithromycin], Serevent [salmeterol], and Tequin [gatifloxacin]    Review of Systems   Review of Systems  Constitutional:  Negative for chills and fever.  HENT:  Negative for ear pain and sore throat.   Eyes:  Negative for pain and visual disturbance.  Respiratory:  Positive for shortness of breath. Negative for cough.   Cardiovascular:  Negative for chest pain and palpitations.  Gastrointestinal:  Negative for abdominal pain and vomiting.  Genitourinary:  Negative for dysuria and hematuria.  Musculoskeletal:  Negative for arthralgias and back pain.  Skin:  Negative for color change and rash.  Neurological:  Negative for seizures and syncope.  All other systems reviewed and are negative.   Physical Exam Updated Vital Signs BP (!) 144/83   Pulse 98   Temp 98.1 F (36.7 C)   Resp (!) 21   Ht '5\' 3"'$  (1.6 m)   Wt 98.4 kg   LMP 11/26/2011   SpO2 97%   BMI 38.44 kg/m  Physical Exam Vitals and nursing note reviewed.  Constitutional:      General: She is not in acute distress.    Appearance: She is well-developed.  HENT:      Head: Normocephalic and atraumatic.  Eyes:     Conjunctiva/sclera: Conjunctivae normal.  Cardiovascular:     Rate and Rhythm: Regular rhythm. Tachycardia present.     Heart sounds: No murmur heard. Pulmonary:     Effort: Pulmonary effort is normal. No respiratory distress.     Breath sounds: Normal breath sounds.  Abdominal:     Palpations: Abdomen is soft.     Tenderness: There is no abdominal tenderness.  Musculoskeletal:        General: No swelling.     Cervical back: Neck supple.  Skin:    General: Skin is warm and dry.     Capillary Refill: Capillary refill takes less than 2 seconds.  Neurological:  Mental Status: She is alert.  Psychiatric:        Mood and Affect: Mood normal.     ED Results / Procedures / Treatments   Labs (all labs ordered are listed, but only abnormal results are displayed) Labs Reviewed  CBC - Abnormal; Notable for the following components:      Result Value   RBC 3.61 (*)    Hemoglobin 11.5 (*)    HCT 34.4 (*)    All other components within normal limits  BASIC METABOLIC PANEL - Abnormal; Notable for the following components:   Glucose, Bld 106 (*)    All other components within normal limits  PROTIME-INR  APTT  LACTIC ACID, PLASMA  BRAIN NATRIURETIC PEPTIDE  HEPARIN LEVEL (UNFRACTIONATED)  TROPONIN I (HIGH SENSITIVITY)    EKG None  Radiology CT Angio Chest Pulmonary Embolism (PE) W or WO Contrast  Result Date: 05/10/2022 CLINICAL DATA:  Evaluate for pulmonary embolism. Post COVID pneumonia. EXAM: CT ANGIOGRAPHY CHEST WITH CONTRAST TECHNIQUE: Multidetector CT imaging of the chest was performed using the standard protocol during bolus administration of intravenous contrast. Multiplanar CT image reconstructions and MIPs were obtained to evaluate the vascular anatomy. RADIATION DOSE REDUCTION: This exam was performed according to the departmental dose-optimization program which includes automated exposure control, adjustment of the mA  and/or kV according to patient size and/or use of iterative reconstruction technique. CONTRAST:  <See Chart> OMNIPAQUE IOHEXOL 300 MG/ML SOLN, 76m OMNIPAQUE IOHEXOL 350 MG/ML SOLN COMPARISON:  CT angiogram chest 04/14/2022. FINDINGS: Cardiovascular: There are pulmonary emboli within the distal right upper lobe pulmonary artery extending into segmental branches. Heart and aorta appear within normal limits. There is no pericardial effusion. Mediastinum/Nodes: No enlarged mediastinal, hilar, or axillary lymph nodes. Thyroid gland, trachea, and esophagus demonstrate no significant findings. Lungs/Pleura: The lungs are now clear. There is no pleural effusion or pneumothorax. Upper Abdomen: Small left peripelvic cysts are present. Musculoskeletal: No chest wall abnormality. No acute osseous abnormality. Multilevel degenerative changes affect the spine. Review of the MIP images confirms the above findings. IMPRESSION: 1. Right upper lobar and segmental pulmonary emboli. No evidence for right heart strain. 2. The lungs are now clear. Electronically Signed   By: ARonney AstersM.D.   On: 05/10/2022 18:06    Procedures .Critical Care  Performed by: DLucrezia Starch MD Authorized by: DLucrezia Starch MD   Critical care provider statement:    Critical care time (minutes):  37   Critical care was necessary to treat or prevent imminent or life-threatening deterioration of the following conditions:  Respiratory failure   Critical care was time spent personally by me on the following activities:  Development of treatment plan with patient or surrogate, discussions with consultants, evaluation of patient's response to treatment, examination of patient, ordering and review of laboratory studies, ordering and review of radiographic studies, ordering and performing treatments and interventions, pulse oximetry, re-evaluation of patient's condition and review of old charts     Medications Ordered in ED Medications   heparin ADULT infusion 100 units/mL (25000 units/259m (1,250 Units/hr Intravenous New Bag/Given 05/10/22 2033)  heparin bolus via infusion 4,500 Units (4,500 Units Intravenous Bolus from Bag 05/10/22 2033)    ED Course/ Medical Decision Making/ A&P                           Medical Decision Making Amount and/or Complexity of Data Reviewed Labs: ordered. ECG/medicine tests: ordered.  Risk Prescription drug management.  66 year old lady presenting to the emergency department due to concern for shortness of breath, pulmonary embolism.  Seen by pulmonologist earlier due to concern for shortness of breath.  Outpatient CT scan concerning for acute PE.  No right heart strain on CT.  Patient noted to be mildly tachycardic but not in distress, not requiring oxygen, no hypoxia.  Hemoglobin at 11 today, similar to prior.  Patient denies any active bleeding, denies prior history of GI bleed, no recent surgeries.  I do not see contraindication to initiation of anticoagulation at this time.  Started on heparin.  Consulted hospitalist for admission.  Additional history obtained from review of chart, review of office visit note from pulmonologist this morning as well as telephone encounter note from pulmonologist post CT scan.  I independently reviewed and interpreted CT results that were performed earlier today.  Findings are concerning for acute PE, no evidence for infiltrate at this time.        Final Clinical Impression(s) / ED Diagnoses Final diagnoses:  Acute pulmonary embolism, unspecified pulmonary embolism type, unspecified whether acute cor pulmonale present [facility])    Rx / DC Orders ED Discharge Orders     None         Lucrezia Starch, MD 05/10/22 2137

## 2022-05-10 NOTE — Assessment & Plan Note (Signed)
She has had questionable worsening of her shortness of breath and cough over the last few weeks.  She has also developed some pleuritic pain with deep inspiration.  I am concerned given her decreased mobilization, tachycardia, worsening respiratory symptoms, pleuritic pain and recent infection that she may have developed a PE or recurrent pneumonia.  We will send her for CTA chest for further evaluation.  She was otherwise clinically stable with oxygen 95% on room air.  If no evidence of PE or acute process, we will get her set up for pulmonary function testing for further assessment given her persistent shortness of breath and cough since COVID infection.  CT will evaluate for possible COVID fibrosis/ILD.  Patient Instructions  Stop Symbicort. Trial Breztri 2 puffs twice daily. Brush tongue and rinse mouth afterwards. If no improvement in your breathing after 2 weeks of use, you can go back to  your Symbicort  Continue albuterol 2 puffs or 58m neb every 6 hours as needed for shortness of breath or wheezing Continue budesonide 2 ml twice daily. Brush tongue, rinse mouth afterwards Continue allegra 180 mg daily for allergies Continue flonase nasal spray 2 sprays each nostril daily Continue xyzal 1 tab daily for allergies Continue singulair 10 mg at bedtime  CTA chest today   Pulmonary function testing scheduled   Follow up after PFTs with Dr. DShearon Stalls If symptoms do not improve or worsen, please contact office for sooner follow up or seek emergency care.

## 2022-05-10 NOTE — ED Triage Notes (Signed)
"  Went to pulmonologist for follow up appointment, got sent to [facility] to get cat scan of lungs, they called me before I got home and told me I have 2 blood clots in my lungs and to go to ED" per pt

## 2022-05-10 NOTE — Progress Notes (Signed)
ANTICOAGULATION CONSULT NOTE - Initial Consult  Pharmacy Consult for heparin Indication: pulmonary embolus  Allergies  Allergen Reactions   Molds & Smuts Anaphylaxis   Biaxin [Clarithromycin]     GI upset   Serevent [Salmeterol]     Palpitations   Tequin [Gatifloxacin]     GI upset. thrush    Patient Measurements: Height: '5\' 3"'$  (160 cm) Weight: 98.4 kg (217 lb) IBW/kg (Calculated) : 52.4 Heparin Dosing Weight: 75kg  Vital Signs: Temp: 98.1 F (36.7 C) (07/07 1916) Temp Source: Oral (07/07 1915) BP: 136/87 (07/07 1916) Pulse Rate: 128 (07/07 1916)  Labs: No results for input(s): "HGB", "HCT", "PLT", "APTT", "LABPROT", "INR", "HEPARINUNFRC", "HEPRLOWMOCWT", "CREATININE", "CKTOTAL", "CKMB", "TROPONINIHS" in the last 72 hours.  CrCl cannot be calculated (Patient's most recent lab result is older than the maximum 21 days allowed.).   Medical History: Past Medical History:  Diagnosis Date   Anxiety    Arthritis    Asthma    Cataract    Gout    Hyperlipidemia    Hypertension    Obese     Assessment: 89 YOF presenting from clinic with positive CT chest for PE.  She is not on anticoagulation PTA  Goal of Therapy:  Heparin level 0.3-0.7 units/ml Monitor platelets by anticoagulation protocol: Yes   Plan:  Heparin 4500 units IV x 1, and gtt at 1250 units/hr F/u 6 hour heparin level  Bertis Ruddy, PharmD Clinical Pharmacist ED Pharmacist Phone # [phone] 05/10/2022 7:41 PM

## 2022-05-10 NOTE — Progress Notes (Addendum)
$'@Patient'a$  ID: Haley White, female    DOB: 03/25/1956, 66 y.o.   MRN: [id number]  Chief Complaint  Patient presents with   Follow-up    Pt here for acute visit, symps. Cough, congestion, fatigue. Did have COVID and pneumonia 04/2022. The only thing that helps is resting    Referring provider: Unk Pinto, MD  HPI: 66 year old female, former remote smoker followed for moderate persistent asthma and DOE.  She is a patient of Dr. Mauricio Po and last seen in office 04/17/2022.  She was referred in June after being diagnosed with COVID in May and having persistent respiratory symptoms since.  She has been treated with multiple rounds of antibiotics and prolonged steroid tapers.  CT from June showed multifocal groundglass opacities, consistent with COVID-pneumonia.  Past medical history significant for hypertension, atherosclerosis, allergic rhinitis, GERD, anxiety, obesity, HLD, MDD with history of SI.  TEST/EVENTS:  04/14/2022 CTA chest: No evidence of PE.  No LAD.  There are patchy areas of groundglass scattered throughout the mid and lower lung zones, consistent with COVID-pneumonia. 05/02/2022 CXR 2 view: Lungs are clear.  No acute cardiopulmonary disease.  04/17/2022: Consult OV with Dr. Shearon Stalls.  Experiencing persistent chest tightness and shortness of breath as well as cough and pain with deep inspiration since having COVID in May.  She has also had some low-grade fevers to 100 and reduced appetite.  Currently on third round of prednisone and unclear if this is making a big difference.  She is also on antibiotics and instructed to finish these.  Ordered budesonide nebs for her to use twice daily until symptoms improve and albuterol nebs to use as needed up to 4 times a day as needed.  Continued Symbicort as previously prescribed.  05/10/2022: Today-acute Patient presents today for acute visit.  She was seen a month ago and had persistent symptoms after COVID infection.  She underwent extensive  work-up and was found to have COVID-pneumonia on 6/11.  She was treated with multiple rounds of antibiotics and steroids.  At her visit on 6/14, she was still having chest congestion and tightness, shortness of breath and cough.  She was advised to continue on her Symbicort and added on budesonide nebs.  She was also sent with albuterol breathing treatments as well.  Today, she reports that she had gone into her primary care doctor's office a week ago and was still having persistent shortness of breath that did not feel like it was improving.  She felt like it was possibly little bit worse with exertion but was not sure if that was from not being up and moving as much recently or worsening of her lungs.  They obtained a chest x-ray which was clear.  She was recommended to follow-up here.  She feels relatively the same as she did at that visit.  She still has a persistent cough, which is nonproductive but congested.  She also has chest tightness and has developed some pleuritic pain in her back, especially with deep breathing on the right side.  She did feel like the nebulizer treatments were helping her and then feels like she just plateaued.  She denies any calf pain//swelling, fevers, anorexia, hemoptysis.  She is eating and drinking well.  She continues on Symbicort twice daily, using her albuterol neb twice a day and using her budesonide neb twice a day.  She is on Allegra, Flonase, Xyzal and Singulair for allergies/asthma.  Allergies  Allergen Reactions   Molds & Smuts Anaphylaxis  Biaxin [Clarithromycin]     GI upset   Serevent [Salmeterol]     Palpitations   Tequin [Gatifloxacin]     GI upset. thrush    Immunization History  Administered Date(s) Administered   Influenza, High Dose Seasonal PF 10/17/2017   Influenza,inj,Quad PF,6+ Mos 09/17/2017   Influenza-Unspecified 08/20/2018   PFIZER(Purple Top)SARS-COV-2 Vaccination 01/20/2020, 02/10/2020, 03/27/2020   PPD Test 10/08/2017, 11/29/2019,  01/10/2021   Pneumococcal Conjugate-13 11/05/1995   Pneumococcal Polysaccharide-23 10/08/2017, 06/15/2021   Td 11/29/2019   Tdap 11/04/2008, 03/28/2020    Past Medical History:  Diagnosis Date   Anxiety    Arthritis    Asthma    Cataract    Gout    Hyperlipidemia    Hypertension    Obese     Tobacco History: Social History   Tobacco Use  Smoking Status Former   Packs/day: 1.00   Years: 7.00   Total pack years: 7.00   Types: Cigarettes   Quit date: 11/04/1977   Years since quitting: 44.5  Smokeless Tobacco Never   Counseling given: Not Answered   Outpatient Medications Prior to Visit  Medication Sig Dispense Refill   albuterol (PROVENTIL) (2.5 MG/3ML) 0.083% nebulizer solution Take 3 mLs (2.5 mg total) by nebulization every 6 (six) hours as needed for wheezing or shortness of breath. 75 mL 6   albuterol (PROVENTIL) (2.5 MG/3ML) 0.083% nebulizer solution 3 mL as needed     albuterol (VENTOLIN HFA) 108 (90 Base) MCG/ACT inhaler Inhale 2 puffs into the lungs every 4 (four) hours as needed for wheezing or shortness of breath. 1 Inhaler 0   Ascorbic Acid (VITAMIN C PO) Take 1 tablet by mouth.     B Complex Vitamins (VITAMIN B-COMPLEX PO) Take 1 tablet by mouth daily.     budesonide (PULMICORT) 0.25 MG/2ML nebulizer solution Take 2 mLs (0.25 mg total) by nebulization in the morning and at bedtime. 60 mL 6   buPROPion (WELLBUTRIN XL) 300 MG 24 hr tablet Take 300 mg by mouth every morning.     Calcium Carbonate (CALCIUM 600 PO) Take by mouth daily.     celecoxib (CELEBREX) 100 MG capsule TAKE 1 CAPSULE(100 MG) BY MOUTH TWICE DAILY 60 capsule 0   diphenhydrAMINE (BENADRYL) 25 MG tablet Take 1 tablet (25 mg total) by mouth at bedtime as needed for up to 12 doses for sleep (Headache). Take with reglan as needed at bedtime to help with headache and sleep 12 tablet 0   EPINEPHrine 0.3 mg/0.3 mL IJ SOAJ injection See admin instructions.     famotidine (PEPCID) 40 MG tablet Take  1  tablet  at Night to Prevent Heartburn & Acid Reflux 30 tablet 1   fexofenadine (ALLEGRA) 180 MG tablet 1 tablet     fluconazole (DIFLUCAN) 100 MG tablet Take  1 tablet  Daily  for Oral Yeast infection 30 tablet 0   FLUoxetine (PROZAC) 20 MG capsule Take 1 capsule (20 mg total) by mouth daily. For depression 5 capsule 0   fluticasone (FLONASE) 50 MCG/ACT nasal spray SHAKE LQ AND U 1 TO 2 SPRAYS IEN QD  3   gabapentin (NEURONTIN) 300 MG capsule Take 1 capsule  3 x /day  for Shingles Neuritis 180 capsule 0   levocetirizine (XYZAL) 5 MG tablet every evening.     lisinopril (ZESTRIL) 20 MG tablet TAKE 1 TABLET BY MOUTH DAILY FOR BLOOD PRESSURE 90 tablet 0   methylphenidate (RITALIN) 10 MG tablet TK 1 T PO BID  0  montelukast (SINGULAIR) 10 MG tablet 1 tablet     ondansetron (ZOFRAN-ODT) 4 MG disintegrating tablet ondansetron 4 mg disintegrating tablet     pantoprazole (PROTONIX) 40 MG tablet Take  1 tablet  every Morning to Prevent Heartburn & Acid Reflux 30 tablet 1   rosuvastatin (CRESTOR) 40 MG tablet TAKE 1 TABLET BY MOUTH DAILY FOR CHOLESTEROL 90 tablet 1   SYMBICORT 160-4.5 MCG/ACT inhaler INL 2 PFS PO BID  6   VITAMIN D PO Take 15,000 Units by mouth daily.     levocetirizine (XYZAL) 5 MG tablet TK 1 T PO QD IN THE EVE  3   metoCLOPramide (REGLAN) 10 MG tablet Take 1 tablet (10 mg total) by mouth every 12 (twelve) hours as needed for up to 12 doses for nausea. 12 tablet 0   potassium chloride (KLOR-CON) 10 MEQ tablet Take 1 tablet (10 mEq total) by mouth daily for 15 days. 15 tablet 0   promethazine-dextromethorphan (PROMETHAZINE-DM) 6.25-15 MG/5ML syrup Take 5 mL as needed at bedtime for severe cough (Patient not taking: Reported on 05/10/2022) 360 mL 1   amoxicillin-clavulanate (AUGMENTIN) 875-125 MG tablet Take 1 tablet by mouth every 12 (twelve) hours. 10 tablet 0   dexamethasone (DECADRON) 4 MG tablet Take 1 tab 3 x day - 3 days, then 2 x day - 3 days, then 1 tab daily 20 tablet 0    doxycycline (VIBRAMYCIN) 100 MG capsule Take 1 capsule 2 x /day with meals for Infection 10 capsule 0   magic mouthwash (nystatin, hydrocortisone, diphenhydrAMINE) suspension Take  1 teaspoon (5 ml)   Swish & Swallow  every 2 hours for mouth ulcers 240 mL 3   valACYclovir (VALTREX) 1000 MG tablet Take 1 tablet 3 x day for Shingles 30 tablet 0   No facility-administered medications prior to visit.     Review of Systems:   Constitutional: No weight loss or gain, night sweats, fevers, chills. +Fatigue (improving) HEENT: No headaches, difficulty swallowing, tooth/dental problems, or sore throat. No sneezing, itching, ear ache, nasal congestion, or post nasal drip CV:  No chest pain, orthopnea, PND, swelling in lower extremities, anasarca, dizziness, palpitations, syncope Resp: +shortness of breath with exertion; cough; chest tightness; occasional wheeze; pain with deep inspiration. No excess mucus or change in color of mucus. No hemoptysis.No chest wall deformity Skin: No rash, lesions, ulcerations MSK:  No joint pain or swelling.  No decreased range of motion.  No back pain. Neuro: No dizziness or lightheadedness.  Psych: No depression or anxiety. Mood stable.     Physical Exam:  BP 132/80   Pulse (!) 108   Ht '5\' 3"'$  (1.6 m)   Wt 217 lb 6.4 oz (98.6 kg)   LMP 11/26/2011   SpO2 95%   BMI 38.51 kg/m   GEN: Pleasant, interactive, well-appearing; obese; in no acute distress. HEENT:  Normocephalic and atraumatic. PERRLA. Sclera white. Nasal turbinates pink, moist and patent bilaterally. No rhinorrhea present. Oropharynx pink and moist, without exudate or edema. No lesions, ulcerations, or postnasal drip.  NECK:  Supple w/ fair ROM. No JVD present.  CV: Tachycardic, regular rhythm, no m/r/g, no peripheral edema. Pulses intact, +2 bilaterally. No cyanosis, pallor or clubbing. PULMONARY:  Unlabored, regular breathing. Clear bilaterally A&P w/o wheezes/rales/rhonchi. No accessory muscle use.  No dullness to percussion. GI: BS present and normoactive. Soft, non-tender to palpation. No organomegaly or masses detected. No CVA tenderness. MSK: No erythema, warmth or tenderness. Cap refil <2 sec all extrem. No deformities or  joint swelling noted.  Neuro: A/Ox3. No focal deficits noted.   Skin: Warm, no lesions or rashe Psych: Normal affect and behavior. Judgement and thought content appropriate.     Lab Results:  CBC    Component Value Date/Time   WBC 7.2 04/14/2022 1629   RBC 3.81 (L) 04/14/2022 1629   HGB 11.9 (L) 04/14/2022 1629   HCT 35.8 (L) 04/14/2022 1629   PLT 259 04/14/2022 1629   MCV 94.0 04/14/2022 1629   MCV 97.5 (A) 11/28/2013 1334   MCH 31.2 04/14/2022 1629   MCHC 33.2 04/14/2022 1629   RDW 14.1 04/14/2022 1629   LYMPHSABS 2.5 04/14/2022 1629   MONOABS 1.4 (H) 04/14/2022 1629   EOSABS 0.7 (H) 04/14/2022 1629   BASOSABS 0.0 04/14/2022 1629    BMET    Component Value Date/Time   NA 138 04/14/2022 1629   NA 144 10/31/2021 1420   K 4.0 04/14/2022 1629   CL 103 04/14/2022 1629   CO2 23 04/14/2022 1629   GLUCOSE 95 04/14/2022 1629   BUN 17 04/14/2022 1629   BUN 12 10/31/2021 1420   CREATININE 0.93 04/14/2022 1629   CREATININE 0.98 01/16/2022 1009   CALCIUM 9.8 04/14/2022 1629   GFRNONAA >60 04/14/2022 1629   GFRNONAA 56 (L) 01/10/2021 0852   GFRAA 65 01/10/2021 0852    BNP    Component Value Date/Time   BNP 45.5 07/22/2017 0950     Imaging:  CT Angio Chest Pulmonary Embolism (PE) W or WO Contrast  Result Date: 05/10/2022 CLINICAL DATA:  Evaluate for pulmonary embolism. Post COVID pneumonia. EXAM: CT ANGIOGRAPHY CHEST WITH CONTRAST TECHNIQUE: Multidetector CT imaging of the chest was performed using the standard protocol during bolus administration of intravenous contrast. Multiplanar CT image reconstructions and MIPs were obtained to evaluate the vascular anatomy. RADIATION DOSE REDUCTION: This exam was performed according to the departmental  dose-optimization program which includes automated exposure control, adjustment of the mA and/or kV according to patient size and/or use of iterative reconstruction technique. CONTRAST:  <See Chart> OMNIPAQUE IOHEXOL 300 MG/ML SOLN, 61m OMNIPAQUE IOHEXOL 350 MG/ML SOLN COMPARISON:  CT angiogram chest 04/14/2022. FINDINGS: Cardiovascular: There are pulmonary emboli within the distal right upper lobe pulmonary artery extending into segmental branches. Heart and aorta appear within normal limits. There is no pericardial effusion. Mediastinum/Nodes: No enlarged mediastinal, hilar, or axillary lymph nodes. Thyroid gland, trachea, and esophagus demonstrate no significant findings. Lungs/Pleura: The lungs are now clear. There is no pleural effusion or pneumothorax. Upper Abdomen: Small left peripelvic cysts are present. Musculoskeletal: No chest wall abnormality. No acute osseous abnormality. Multilevel degenerative changes affect the spine. Review of the MIP images confirms the above findings. IMPRESSION: 1. Right upper lobar and segmental pulmonary emboli. No evidence for right heart strain. 2. The lungs are now clear. Electronically Signed   By: ARonney AstersM.D.   On: 05/10/2022 18:06   DG Chest 2 View  Result Date: 05/03/2022 CLINICAL DATA:  Hx of covid pna 03/23, continued sob, cough congestion, mid chest discomfort, hx of asthma, non smoker EXAM: CHEST - 2 VIEW COMPARISON:  04/14/2022. FINDINGS: Cardiac silhouette is normal in size. No mediastinal or hilar masses or evidence of adenopathy. Clear lungs.  No pleural effusion or pneumothorax. Skeletal structures are intact. IMPRESSION: No active cardiopulmonary disease. Electronically Signed   By: DLajean ManesM.D.   On: 05/03/2022 15:17   CT Angio Chest PE W and/or Wo Contrast  Result Date: 04/14/2022 CLINICAL DATA:  Pulmonary embolism (PE)  suspected, positive D-dimer. Dyspnea, elevated D-dimer, recent COVID infection. EXAM: CT ANGIOGRAPHY CHEST WITH  CONTRAST TECHNIQUE: Multidetector CT imaging of the chest was performed using the standard protocol during bolus administration of intravenous contrast. Multiplanar CT image reconstructions and MIPs were obtained to evaluate the vascular anatomy. RADIATION DOSE REDUCTION: This exam was performed according to the departmental dose-optimization program which includes automated exposure control, adjustment of the mA and/or kV according to patient size and/or use of iterative reconstruction technique. CONTRAST:  42m OMNIPAQUE IOHEXOL 350 MG/ML SOLN COMPARISON:  None Available. FINDINGS: Cardiovascular: Adequate opacification of the pulmonary arterial tree. No intraluminal filling defect identified to suggest acute pulmonary embolism. Central pulmonary arteries are of normal caliber. No significant coronary artery calcification. Cardiac size within normal limits. No pericardial effusion. No significant atherosclerotic calcification within the thoracic aorta. No aortic aneurysm. Mediastinum/Nodes: No enlarged mediastinal, hilar, or axillary lymph nodes. Thyroid gland, trachea, and esophagus demonstrate no significant findings. Lungs/Pleura: Patchy asymmetric ground-glass pulmonary infiltrate within the mid and lower lung zones bilaterally, more prevalent within the left perihilar region and right lower lobe subpleurally is likely infectious or inflammatory in nature in can be seen the setting of COVID-19 pneumonia. No pneumothorax or pleural effusion. Central airways are widely patent. Upper Abdomen: Probable parapelvic cysts within the visualized left kidney appear enlarged when compared to remote prior examination of 04/05/2012, but are not well characterized on this examination. No acute abnormality identified. Musculoskeletal: No acute bone abnormality. No lytic or blastic bone lesion. Review of the MIP images confirms the above findings. IMPRESSION: No pulmonary embolism. Multifocal pulmonary infiltrates, likely  infectious or inflammatory and compatible with reported recent COVID-19 pneumonia. Electronically Signed   By: AFidela SalisburyM.D.   On: 04/14/2022 18:42   DG Chest 2 View  Result Date: 04/14/2022 CLINICAL DATA:  Shortness of breath EXAM: CHEST - 2 VIEW COMPARISON:  None Available. FINDINGS: Cardiomediastinal silhouette is unchanged. There is no focal airspace consolidation. No pleural effusion. No pneumothorax. No acute osseous abnormality. 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 IMPRESSION: No evidence of acute cardiopulmonary disease. Electronically Signed   By: JMaurine SimmeringM.D.   On: 04/14/2022 16:09    ipratropium-albuterol (DUONEB) 0.5-2.5 (3) MG/3ML nebulizer solution 3 mL     Date Action Dose Route User   Discharged on 04/14/2022   Admitted on 04/14/2022   Discharged on 03/28/2022   Admitted on 03/28/2022   Discharged on 03/22/2022   Admitted on 03/22/2022   03/20/2022 1506 Given 3 mL Nebulization YArelia Sneddon CMA      ipratropium-albuterol (DUONEB) 0.5-2.5 (3) MG/3ML nebulizer solution 3 mL     Date Action Dose Route User   04/17/2022 1516 Given 3 mL Nebulization FNolon Stalls HKimber Relic RN           No data to display          Lab Results  Component Value Date   NITRICOXIDE 14 01/27/2018        Assessment & Plan:   DOE (dyspnea on exertion) She has had questionable worsening of her shortness of breath and cough over the last few weeks.  She has also developed some pleuritic pain with deep inspiration.  I am concerned given her decreased mobilization, tachycardia, worsening respiratory symptoms, pleuritic pain and recent infection that she may have developed a PE or recurrent pneumonia.  We will send her for CTA chest for further  evaluation.  She was otherwise clinically stable with oxygen 95% on room air.  If no evidence of PE or acute process, we will get  her set up for pulmonary function testing for further assessment given her persistent shortness of breath and cough since COVID infection.  CT will evaluate for possible COVID fibrosis/ILD.  Patient Instructions  Stop Symbicort. Trial Breztri 2 puffs twice daily. Brush tongue and rinse mouth afterwards. If no improvement in your breathing after 2 weeks of use, you can go back to  your Symbicort  Continue albuterol 2 puffs or 35m neb every 6 hours as needed for shortness of breath or wheezing Continue budesonide 2 ml twice daily. Brush tongue, rinse mouth afterwards Continue allegra 180 mg daily for allergies Continue flonase nasal spray 2 sprays each nostril daily Continue xyzal 1 tab daily for allergies Continue singulair 10 mg at bedtime  CTA chest today   Pulmonary function testing scheduled   Follow up after PFTs with Dr. DShearon Stalls If symptoms do not improve or worsen, please contact office for sooner follow up or seek emergency care.     Asthma Her asthma appears overall compensated.  No evidence of bronchospasm on exam.  I was concerned that her cough could be related to asthmatic bronchitis; however, FeNO was normal and lung exam was unremarkable.  She has not had much of a response to prednisone the last few times either.  Recommended that we hold off on any further steroids at this time.  Advised her to continue Symbicort twice daily, budesonide twice daily and albuterol every 6 hours as needed.  Allergic rhinitis Well-controlled on current regimen.  Continue Xyzal, Allegra, Singulair and Flonase.   I spent 35 minutes of dedicated to the care of this patient on the date of this encounter to include pre-visit review of records, face-to-face time with the patient discussing conditions above, post visit ordering of testing, clinical documentation with the electronic health record, making appropriate referrals as documented, and communicating necessary findings to members of the patients  care team.  KClayton Bibles NP 05/10/2022  Pt aware and understands NP's role.

## 2022-05-11 ENCOUNTER — Observation Stay (HOSPITAL_BASED_OUTPATIENT_CLINIC_OR_DEPARTMENT_OTHER): Payer: Medicare HMO

## 2022-05-11 ENCOUNTER — Other Ambulatory Visit: Payer: Self-pay

## 2022-05-11 DIAGNOSIS — I2609 Other pulmonary embolism with acute cor pulmonale: Secondary | ICD-10-CM | POA: Diagnosis not present

## 2022-05-11 DIAGNOSIS — Z8616 Personal history of COVID-19: Secondary | ICD-10-CM | POA: Diagnosis not present

## 2022-05-11 DIAGNOSIS — I10 Essential (primary) hypertension: Secondary | ICD-10-CM | POA: Diagnosis not present

## 2022-05-11 DIAGNOSIS — Z79899 Other long term (current) drug therapy: Secondary | ICD-10-CM | POA: Diagnosis not present

## 2022-05-11 DIAGNOSIS — Z87891 Personal history of nicotine dependence: Secondary | ICD-10-CM | POA: Diagnosis not present

## 2022-05-11 DIAGNOSIS — I2699 Other pulmonary embolism without acute cor pulmonale: Secondary | ICD-10-CM

## 2022-05-11 DIAGNOSIS — J45909 Unspecified asthma, uncomplicated: Secondary | ICD-10-CM | POA: Diagnosis not present

## 2022-05-11 DIAGNOSIS — R0602 Shortness of breath: Secondary | ICD-10-CM | POA: Diagnosis not present

## 2022-05-11 LAB — CBC
HCT: 33 % — ABNORMAL LOW (ref 36.0–46.0)
Hemoglobin: 10.7 g/dL — ABNORMAL LOW (ref 12.0–15.0)
MCH: 31.8 pg (ref 26.0–34.0)
MCHC: 32.4 g/dL (ref 30.0–36.0)
MCV: 98.2 fL (ref 80.0–100.0)
Platelets: 271 10*3/uL (ref 150–400)
RBC: 3.36 MIL/uL — ABNORMAL LOW (ref 3.87–5.11)
RDW: 14.6 % (ref 11.5–15.5)
WBC: 6.5 10*3/uL (ref 4.0–10.5)
nRBC: 0 % (ref 0.0–0.2)

## 2022-05-11 LAB — HEPARIN LEVEL (UNFRACTIONATED): Heparin Unfractionated: 0.11 IU/mL — ABNORMAL LOW (ref 0.30–0.70)

## 2022-05-11 LAB — HIV ANTIBODY (ROUTINE TESTING W REFLEX): HIV Screen 4th Generation wRfx: NONREACTIVE

## 2022-05-11 MED ORDER — ONDANSETRON HCL 4 MG/2ML IJ SOLN
4.0000 mg | Freq: Once | INTRAMUSCULAR | Status: AC
  Administered 2022-05-11: 4 mg via INTRAVENOUS
  Filled 2022-05-11: qty 2

## 2022-05-11 MED ORDER — HYDROMORPHONE HCL 1 MG/ML IJ SOLN: 0.5000 mg | INTRAMUSCULAR | Status: DC | PRN

## 2022-05-11 MED ORDER — HEPARIN BOLUS VIA INFUSION
3000.0000 [IU] | Freq: Once | INTRAVENOUS | Status: AC
  Administered 2022-05-11: 3000 [IU] via INTRAVENOUS
  Filled 2022-05-11: qty 3000

## 2022-05-11 MED ORDER — MOMETASONE FURO-FORMOTEROL FUM 200-5 MCG/ACT IN AERO
2.0000 | INHALATION_SPRAY | Freq: Two times a day (BID) | RESPIRATORY_TRACT | Status: DC
  Administered 2022-05-11 – 2022-05-12 (×2): 2 via RESPIRATORY_TRACT
  Filled 2022-05-11 (×2): qty 8.8

## 2022-05-11 MED ORDER — ACETAMINOPHEN 325 MG PO TABS
650.0000 mg | ORAL_TABLET | Freq: Four times a day (QID) | ORAL | Status: DC | PRN
  Administered 2022-05-11: 650 mg via ORAL
  Filled 2022-05-11: qty 2

## 2022-05-11 MED ORDER — KETOROLAC TROMETHAMINE 15 MG/ML IJ SOLN
15.0000 mg | Freq: Once | INTRAMUSCULAR | Status: AC
  Administered 2022-05-11: 15 mg via INTRAVENOUS
  Filled 2022-05-11: qty 1

## 2022-05-11 MED ORDER — APIXABAN 5 MG PO TABS: 5.0000 mg | ORAL_TABLET | Freq: Two times a day (BID) | ORAL | Status: DC

## 2022-05-11 MED ORDER — ALBUTEROL SULFATE (2.5 MG/3ML) 0.083% IN NEBU
2.5000 mg | INHALATION_SOLUTION | Freq: Four times a day (QID) | RESPIRATORY_TRACT | Status: DC | PRN
Start: 2022-05-11 — End: 2022-05-12

## 2022-05-11 MED ORDER — OXYCODONE HCL 5 MG PO TABS: 5.0000 mg | ORAL_TABLET | Freq: Four times a day (QID) | ORAL | Status: DC | PRN

## 2022-05-11 MED ORDER — PANTOPRAZOLE SODIUM 40 MG PO TBEC
40.0000 mg | DELAYED_RELEASE_TABLET | Freq: Every day | ORAL | Status: DC
  Administered 2022-05-11 – 2022-05-12 (×2): 40 mg via ORAL
  Filled 2022-05-11 (×2): qty 1

## 2022-05-11 MED ORDER — POLYETHYLENE GLYCOL 3350 17 G PO PACK: 17.0000 g | PACK | Freq: Every day | ORAL | Status: DC | PRN

## 2022-05-11 MED ORDER — APIXABAN 5 MG PO TABS
10.0000 mg | ORAL_TABLET | Freq: Two times a day (BID) | ORAL | Status: DC
  Administered 2022-05-11 – 2022-05-12 (×3): 10 mg via ORAL
  Filled 2022-05-11 (×3): qty 2

## 2022-05-11 MED ORDER — SODIUM CHLORIDE 0.9 % IV SOLN: INTRAVENOUS | Status: AC

## 2022-05-11 MED ORDER — MELATONIN 5 MG PO TABS: 5.0000 mg | ORAL_TABLET | Freq: Every evening | ORAL | Status: DC | PRN

## 2022-05-11 MED ORDER — DIPHENHYDRAMINE HCL 50 MG/ML IJ SOLN
12.5000 mg | Freq: Once | INTRAMUSCULAR | Status: AC
  Administered 2022-05-11: 12.5 mg via INTRAVENOUS
  Filled 2022-05-11: qty 1

## 2022-05-11 MED ORDER — ROSUVASTATIN CALCIUM 20 MG PO TABS
40.0000 mg | ORAL_TABLET | Freq: Every day | ORAL | Status: DC
  Administered 2022-05-11 – 2022-05-12 (×2): 40 mg via ORAL
  Filled 2022-05-11 (×2): qty 2

## 2022-05-11 MED ORDER — METOCLOPRAMIDE HCL 5 MG/ML IJ SOLN
10.0000 mg | Freq: Once | INTRAMUSCULAR | Status: AC
  Administered 2022-05-11: 10 mg via INTRAVENOUS
  Filled 2022-05-11: qty 2

## 2022-05-11 NOTE — Progress Notes (Signed)
[facility] for apixaban Indication: pulmonary embolus  Allergies  Allergen Reactions   Molds & Smuts Anaphylaxis   Biaxin [Clarithromycin]     GI upset   Serevent [Salmeterol]     Palpitations   Tequin [Gatifloxacin]     GI upset. thrush    Patient Measurements: Height: '5\' 3"'$  (160 cm) Weight: 98.4 kg (217 lb) IBW/kg (Calculated) : 52.4 Heparin Dosing Weight: 75kg  Vital Signs: Temp: 98.2 F (36.8 C) (07/08 0911) Temp Source: Oral (07/08 0911) BP: 124/81 (07/08 0911) Pulse Rate: 97 (07/08 0911)  Labs: Recent Labs    05/10/22 2015 05/11/22 0559  HGB 11.5* 10.7*  HCT 34.4* 33.0*  PLT 289 271  APTT 27  --   LABPROT 12.5  --   INR 0.9  --   HEPARINUNFRC  --  0.11*  CREATININE 0.92  --   TROPONINIHS 3  --     Estimated Creatinine Clearance: 68.1 mL/min (by C-G formula based on SCr of 0.92 mg/dL).  Assessment: 73 YOF presenting from clinic with positive CT chest for PE.  She is not on anticoagulation PTA. Pharmacy consulted to transition from IV heparin to apixaban. No bleeding noted, Hgb stable 10-11s, platelets are normal.  Plan:  Discontinue IV heparin at time of apixaban administration Apixaban 10 mg PO bid for 7 days then 5 mg PO bid Monitor for s/sx of bleeding Education prior to discharge  Thank you for involving pharmacy in this patient's care.  Renold Genta, PharmD, BCPS Clinical Pharmacist Clinical phone for 05/11/2022 until 3p is [phone] 05/11/2022 11:55 AM

## 2022-05-11 NOTE — Progress Notes (Signed)
Pt ambulated about 100 ft on room air. O2 sat remained greater than 95%.  Idolina Primer, RN

## 2022-05-11 NOTE — Progress Notes (Signed)
Progress Note    Haley White   DDU:[id number]  DOB: July 05, 1956  DOA: 05/10/2022     0 PCP: Unk Pinto, MD  Initial CC: SOB  Hospital Course: Haley White is a 66 yo female with PMH HTN, HLD, anxiety, arthritis, obesity who presented with worsening shortness of breath. She recently had COVID-19 in June 2023 and has been even more immobile than her previous state.  She has been using electric wheelchairs when going to the stores and is not active at home.  She underwent evaluation at her pulmonologist office including CTA chest.  This was found to be positive for right-sided PE and due to tachycardia, she was referred to the ER for further work-up. She was initially started on a heparin drip.  Echo was ordered.  She was transitioned to Eliquis after anticoagulation discussion.  Interval History:  Seen this afternoon resting in bed.  Daughter present bedside.  Patient endorses having her dyspnea more so with exertion.  But she seems to be comfortable at rest in bed. Tachycardia has improved since admission.  Assessment and Plan: * Acute pulmonary embolism ([place]) - Considered provoked in setting of recent COVID-19 pneumonia as well as poor mobility status at baseline and worsened recently - Started on heparin drip on admission - CTA chest showed right upper lobar and segmental PE - Obtain echo.  Patient unable to be discharged same day due to inability for echo to be done -Anticoagulation discussed and patient elected for transitioning to Eliquis - Outpatient follow-up with pulmonology   Chronic normocytic anemia Hemoglobin 11.5 with MCV of 95 Nonbleeding internal hemorrhoids seen on colonoscopy in 2021 No overt bleeding reported at this time Monitor H&H for now   GERD Resume home PPI  Hyperlipidemia Resume home Crestor   Severe morbid obesity BMI 38 Recommend weight loss outpatient with regular physical activity and healthy dieting.    Old records reviewed in assessment  of this patient  Antimicrobials:   DVT prophylaxis:   apixaban (ELIQUIS) tablet 10 mg  apixaban (ELIQUIS) tablet 5 mg   Code Status:   Code Status: Full Code  Mobility Assessment (last 72 hours)     Mobility Assessment     Row Name 05/11/22 2376 05/11/22 0250         Does patient have an order for bedrest or is patient medically unstable No - Continue assessment No - Continue assessment      What is the highest level of mobility based on the progressive mobility assessment? Level 6 (Walks independently in room and hall) - Balance while walking in room without assist - Complete Level 6 (Walks independently in room and hall) - Balance while walking in room without assist - Complete               Disposition Plan:  Home Sunday  Status is: Obs  Objective: Blood pressure 124/81, pulse 97, temperature 98.2 F (36.8 C), temperature source Oral, resp. rate 18, height '5\' 3"'$  (1.6 m), weight 98.4 kg, last menstrual period 11/26/2011, SpO2 94 %.  Examination:  Physical Exam Constitutional:      Appearance: Normal appearance. She is obese.  HENT:     Head: Normocephalic and atraumatic.     Mouth/Throat:     Mouth: Mucous membranes are moist.  Eyes:     Extraocular Movements: Extraocular movements intact.  Cardiovascular:     Rate and Rhythm: Normal rate and regular rhythm.  Pulmonary:     Effort: Pulmonary effort is normal.  Breath sounds: Normal breath sounds.  Abdominal:     General: Bowel sounds are normal. There is no distension.     Palpations: Abdomen is soft.     Tenderness: There is no abdominal tenderness.  Musculoskeletal:        General: Normal range of motion.     Cervical back: Normal range of motion and neck supple.  Skin:    General: Skin is warm and dry.  Neurological:     General: No focal deficit present.     Mental Status: She is alert.  Psychiatric:        Mood and Affect: Mood normal.        Behavior: Behavior normal.      Consultants:     Procedures:    Data Reviewed: Results for orders placed or performed during the hospital encounter of 05/10/22 (from the past 24 hour(s))  CBC     Status: Abnormal   Collection Time: 05/10/22  8:15 PM  Result Value Ref Range   WBC 7.2 4.0 - 10.5 K/uL   RBC 3.61 (L) 3.87 - 5.11 MIL/uL   Hemoglobin 11.5 (L) 12.0 - 15.0 g/dL   HCT 34.4 (L) 36.0 - 46.0 %   MCV 95.3 80.0 - 100.0 fL   MCH 31.9 26.0 - 34.0 pg   MCHC 33.4 30.0 - 36.0 g/dL   RDW 14.4 11.5 - 15.5 %   Platelets 289 150 - 400 K/uL   nRBC 0.0 0.0 - 0.2 %  Troponin I (High Sensitivity)     Status: None   Collection Time: 05/10/22  8:15 PM  Result Value Ref Range   Troponin I (High Sensitivity) 3 <18 ng/L  Brain natriuretic peptide     Status: None   Collection Time: 05/10/22  8:15 PM  Result Value Ref Range   B Natriuretic Peptide 20.2 0.0 - 100.0 pg/mL  Protime-INR     Status: None   Collection Time: 05/10/22  8:15 PM  Result Value Ref Range   Prothrombin Time 12.5 11.4 - 15.2 seconds   INR 0.9 0.8 - 1.2  Basic metabolic panel     Status: Abnormal   Collection Time: 05/10/22  8:15 PM  Result Value Ref Range   Sodium 144 135 - 145 mmol/L   Potassium 4.0 3.5 - 5.1 mmol/L   Chloride 108 98 - 111 mmol/L   CO2 23 22 - 32 mmol/L   Glucose, Bld 106 (H) 70 - 99 mg/dL   BUN 12 8 - 23 mg/dL   Creatinine, Ser 0.92 0.44 - 1.00 mg/dL   Calcium 9.6 8.9 - 10.3 mg/dL   GFR, Estimated >60 >60 mL/min   Anion gap 13 5 - 15  APTT     Status: None   Collection Time: 05/10/22  8:15 PM  Result Value Ref Range   aPTT 27 24 - 36 seconds  Lactic acid, plasma     Status: None   Collection Time: 05/10/22  8:15 PM  Result Value Ref Range   Lactic Acid, Venous 1.6 0.5 - 1.9 mmol/L  HIV Antibody (routine testing w rflx)     Status: None   Collection Time: 05/11/22  5:59 AM  Result Value Ref Range   HIV Screen 4th Generation wRfx Non Reactive Non Reactive  Heparin level (unfractionated)     Status: Abnormal   Collection Time:  05/11/22  5:59 AM  Result Value Ref Range   Heparin Unfractionated 0.11 (L) 0.30 - 0.70 IU/mL  CBC  Status: Abnormal   Collection Time: 05/11/22  5:59 AM  Result Value Ref Range   WBC 6.5 4.0 - 10.5 K/uL   RBC 3.36 (L) 3.87 - 5.11 MIL/uL   Hemoglobin 10.7 (L) 12.0 - 15.0 g/dL   HCT 33.0 (L) 36.0 - 46.0 %   MCV 98.2 80.0 - 100.0 fL   MCH 31.8 26.0 - 34.0 pg   MCHC 32.4 30.0 - 36.0 g/dL   RDW 14.6 11.5 - 15.5 %   Platelets 271 150 - 400 K/uL   nRBC 0.0 0.0 - 0.2 %    I have Reviewed nursing notes, Vitals, and Lab results since pt's last encounter. Pertinent lab results : see above I have ordered test including BMP, CBC, Mg I have reviewed the last note from staff over past 24 hours I have discussed pt's care plan and test results with nursing staff, case manager   LOS: 0 days   Dwyane Dee, MD [facility] 05/11/2022, 6:52 PM

## 2022-05-11 NOTE — Care Management Obs Status (Signed)
[place] NOTIFICATION   Patient Details  Name: Haley White MRN: [id number] Date of Birth: Jan 17, 1956   Medicare Observation Status Notification Given:  Yes    Yeira Gulden G., RN 05/11/2022, 10:34 AM

## 2022-05-11 NOTE — H&P (Addendum)
History and Physical  Haley White:[id number] DOB: [birth year] DOA: 05/10/2022  Referring physician: Accepted from [place] ED to [facility] telemetry medical unit as observation status by Dr. Jeral Fruit, [facility], hospitalist service. PCP: Unk Pinto, MD  Outpatient Specialists: Pulmonary Patient coming from: Home sent from pulmonary's office to [place] ED/  Chief Complaint: Ongoing dyspnea following COVID-19 viral pneumonia, sent from pulmonary's office due to acute pulmonary embolism.  HPI: Haley White is a 66 y.o. female with medical history significant for former smoker, asthma, recent COVID-19 viral pneumonia in June 2023, essential hypertension, hyperlipidemia, chronic anxiety/depression, GERD, obesity, MDD, allergic rhinitis, atherosclerosis, who initially presented to [facility] ED, sent from her pulmonologist's office due to acute pulmonary embolism seen on CT scan without CT evidence of right heart strain.  Endorses intermittent chest discomfort.  In the ED, she was started on heparin drip.  High-sensitivity troponin negative.  BNP normal.  The hospitalist service, [facility], was asked to admit.  The patient was accepted and admitted by Dr. Marlowe Sax.  ED Course: Tmax 98.6.  BP 134/83, pulse 97, respiration rate 18, saturation 98% on room air.  Review of Systems: Review of systems as noted in the HPI. All other systems reviewed and are negative.   Past Medical History:  Diagnosis Date   Anxiety    Arthritis    Asthma    Cataract    Gout    Hyperlipidemia    Hypertension    Obese    Past Surgical History:  Procedure Laterality Date   APPENDECTOMY  1984   CATARACT EXTRACTION W/ INTRAOCULAR LENS  IMPLANT, BILATERAL  2014   CESAREAN SECTION  1984, 1987, 1994   X3   COLONOSCOPY  11/16/2013   w/B[place]ie    FOOT SURGERY Left 2002   KNEE SURGERY Left 1998   ROTATOR CUFF REPAIR Right 2007   TUBAL LIGATION  1994    Social History:  reports that she quit smoking about 44 years ago.  Her smoking use included cigarettes. She has a 7.00 pack-year smoking history. She has never used smokeless tobacco. She reports current alcohol use of about 4.0 standard drinks of alcohol per week. She reports that she does not use drugs.   Allergies  Allergen Reactions   Molds & Smuts Anaphylaxis   Biaxin [Clarithromycin]     GI upset   Serevent [Salmeterol]     Palpitations   Tequin [Gatifloxacin]     GI upset. thrush    Family History  Problem Relation Age of Onset   Dementia Father    Anxiety disorder Sister    Colon cancer Neg Hx    Colon polyps Neg Hx    Esophageal cancer Neg Hx    Stomach cancer Neg Hx    Rectal cancer Neg Hx       Prior to Admission medications   Medication Sig Start Date End Date Taking? Authorizing Provider  albuterol (PROVENTIL) (2.5 MG/3ML) 0.083% nebulizer solution Take 3 mLs (2.5 mg total) by nebulization every 6 (six) hours as needed for wheezing or shortness of breath. 04/17/22   Spero Geralds, MD  albuterol (PROVENTIL) (2.5 MG/3ML) 0.083% nebulizer solution 3 mL as needed 03/21/22   [provider]  albuterol (VENTOLIN HFA) 108 (90 Base) MCG/ACT inhaler Inhale 2 puffs into the lungs every 4 (four) hours as needed for wheezing or shortness of breath. 07/17/17   Liane Comber, NP  Ascorbic Acid (VITAMIN C PO) Take 1 tablet by mouth.    [provider]  B Complex Vitamins (VITAMIN B-COMPLEX PO) Take 1 tablet by mouth daily.    [provider]  Budeson-Glycopyrrol-Formoterol (BREZTRI AEROSPHERE) 160-9-4.8 MCG/ACT AERO Inhale 2 puffs into the lungs in the morning and at bedtime. 05/10/22   Cobb, Karie Schwalbe, NP  budesonide (PULMICORT) 0.25 MG/2ML nebulizer solution Take 2 mLs (0.25 mg total) by nebulization in the morning and at bedtime. 04/17/22   Spero Geralds, MD  buPROPion (WELLBUTRIN XL) 300 MG 24 hr tablet Take 300 mg by mouth every morning. 12/17/20   [provider]  Calcium Carbonate (CALCIUM 600 PO) Take by  mouth daily.    [provider]  celecoxib (CELEBREX) 100 MG capsule TAKE 1 CAPSULE(100 MG) BY MOUTH TWICE DAILY 04/19/22   Trula Slade, DPM  diphenhydrAMINE (BENADRYL) 25 MG tablet Take 1 tablet (25 mg total) by mouth at bedtime as needed for up to 12 doses for sleep (Headache). Take with reglan as needed at bedtime to help with headache and sleep 03/22/22   Wyvonnia Dusky, MD  EPINEPHrine 0.3 mg/0.3 mL IJ SOAJ injection See admin instructions. 02/08/20   [provider]  famotidine (PEPCID) 40 MG tablet Take  1 tablet  at Night to Prevent Heartburn & Acid Reflux 04/02/22   Unk Pinto, MD  fexofenadine (ALLEGRA) 180 MG tablet 1 tablet    [provider]  fluconazole (DIFLUCAN) 100 MG tablet Take  1 tablet  Daily  for Oral Yeast infection 04/10/22   Unk Pinto, MD  FLUoxetine (PROZAC) 20 MG capsule Take 1 capsule (20 mg total) by mouth daily. For depression 05/08/15   Lindell Spar I, NP  fluticasone (FLONASE) 50 MCG/ACT nasal spray SHAKE LQ AND U 1 TO 2 SPRAYS IEN QD 10/17/17   [provider]  gabapentin (NEURONTIN) 300 MG capsule Take 1 capsule  3 x /day  for Shingles Neuritis 03/30/22   Unk Pinto, MD  levocetirizine (XYZAL) 5 MG tablet TK 1 T PO QD IN THE EVE 12/07/17   [provider]  levocetirizine (XYZAL) 5 MG tablet every evening.    [provider]  lisinopril (ZESTRIL) 20 MG tablet TAKE 1 TABLET BY MOUTH DAILY FOR BLOOD PRESSURE 12/01/21   Alycia Rossetti, NP  methylphenidate (RITALIN) 10 MG tablet TK 1 T PO BID 01/16/18   [provider]  metoCLOPramide (REGLAN) 10 MG tablet Take 1 tablet (10 mg total) by mouth every 12 (twelve) hours as needed for up to 12 doses for nausea. 03/22/22   Wyvonnia Dusky, MD  montelukast (SINGULAIR) 10 MG tablet 1 tablet    [provider]  ondansetron (ZOFRAN-ODT) 4 MG disintegrating tablet ondansetron 4 mg disintegrating tablet    [provider]   pantoprazole (PROTONIX) 40 MG tablet Take  1 tablet  every Morning to Prevent Heartburn & Acid Reflux 04/02/22   Unk Pinto, MD  potassium chloride (KLOR-CON) 10 MEQ tablet Take 1 tablet (10 mEq total) by mouth daily for 15 days. 03/22/22 04/06/22  Wyvonnia Dusky, MD  promethazine-dextromethorphan (PROMETHAZINE-DM) 6.25-15 MG/5ML syrup Take 5 mL as needed at bedtime for severe cough Patient not taking: Reported on 05/10/2022 03/18/22   Darrol Jump, NP  rosuvastatin (CRESTOR) 40 MG tablet TAKE 1 TABLET BY MOUTH DAILY FOR CHOLESTEROL 04/30/22   Liane Comber, NP  SYMBICORT 160-4.5 MCG/ACT inhaler INL 2 PFS PO BID 01/16/18   [provider]  VITAMIN D PO Take 15,000 Units by mouth daily.    [provider]  Physical Exam: BP 134/83 (BP Location: Left Arm)   Pulse 97   Temp 98.6 F (37 C) (Oral)   Resp 18   Ht '5\' 3"'$  (1.6 m)   Wt 98.4 kg   LMP 11/26/2011   SpO2 98%   BMI 38.44 kg/m   General: 66 y.o. year-old female well developed well nourished in no acute distress.  Alert and oriented x3. Cardiovascular: Regular rate and rhythm with no rubs or gallops.  No thyromegaly or JVD noted.  No lower extremity edema. 2/4 pulses in all 4 extremities. Respiratory: Clear to auscultation with no wheezes or rales. Good inspiratory effort. Abdomen: Soft nontender nondistended with normal bowel sounds x4 quadrants. Muskuloskeletal: No cyanosis, clubbing or edema noted bilaterally Neuro: CN II-XII intact, strength, sensation, reflexes Skin: No ulcerative lesions noted or rashes Psychiatry: Judgement and insight appear normal. Mood is appropriate for condition and setting          Labs on Admission:  Basic Metabolic Panel: Recent Labs  Lab 05/10/22 2015  NA 144  K 4.0  CL 108  CO2 23  GLUCOSE 106*  BUN 12  CREATININE 0.92  CALCIUM 9.6   Liver Function Tests: No results for input(s): "AST", "ALT", "ALKPHOS", "BILITOT", "PROT", "ALBUMIN" in the last 168 hours. No  results for input(s): "LIPASE", "AMYLASE" in the last 168 hours. No results for input(s): "AMMONIA" in the last 168 hours. CBC: Recent Labs  Lab 05/10/22 2015  WBC 7.2  HGB 11.5*  HCT 34.4*  MCV 95.3  PLT 289   Cardiac Enzymes: No results for input(s): "CKTOTAL", "CKMB", "CKMBINDEX", "TROPONINI" in the last 168 hours.  BNP (last 3 results) Recent Labs    05/10/22 2015  BNP 20.2    ProBNP (last 3 results) No results for input(s): "PROBNP" in the last 8760 hours.  CBG: No results for input(s): "GLUCAP" in the last 168 hours.  Radiological Exams on Admission: CT Angio Chest Pulmonary Embolism (PE) W or WO Contrast  Result Date: 05/10/2022 CLINICAL DATA:  Evaluate for pulmonary embolism. Post COVID pneumonia. EXAM: CT ANGIOGRAPHY CHEST WITH CONTRAST TECHNIQUE: Multidetector CT imaging of the chest was performed using the standard protocol during bolus administration of intravenous contrast. Multiplanar CT image reconstructions and MIPs were obtained to evaluate the vascular anatomy. RADIATION DOSE REDUCTION: This exam was performed according to the departmental dose-optimization program which includes automated exposure control, adjustment of the mA and/or kV according to patient size and/or use of iterative reconstruction technique. CONTRAST:  <See Chart> OMNIPAQUE IOHEXOL 300 MG/ML SOLN, 54m OMNIPAQUE IOHEXOL 350 MG/ML SOLN COMPARISON:  CT angiogram chest 04/14/2022. FINDINGS: Cardiovascular: There are pulmonary emboli within the distal right upper lobe pulmonary artery extending into segmental branches. Heart and aorta appear within normal limits. There is no pericardial effusion. Mediastinum/Nodes: No enlarged mediastinal, hilar, or axillary lymph nodes. Thyroid gland, trachea, and esophagus demonstrate no significant findings. Lungs/Pleura: The lungs are now clear. There is no pleural effusion or pneumothorax. Upper Abdomen: Small left peripelvic cysts are present. Musculoskeletal: No  chest wall abnormality. No acute osseous abnormality. Multilevel degenerative changes affect the spine. Review of the MIP images confirms the above findings. IMPRESSION: 1. Right upper lobar and segmental pulmonary emboli. No evidence for right heart strain. 2. The lungs are now clear. Electronically Signed   By: ARonney AstersM.D.   On: 05/10/2022 18:06    EKG: I independently viewed the EKG done and my findings are as followed: Sinus tachycardia rate of 121.  Nonspecific ST-T changes.  QTc 437.  Assessment/Plan Present on Admission:  Acute pulmonary embolism (H[facility])  Principal Problem:   Acute pulmonary embolism ([place])  Acute pulmonary embolism without right heart strain by CT scan. Recent COVID-19 viral pneumonia, April 14, 2022. Not hypoxic with O2 saturation 98% on room air BNP and high-sensitivity troponin normal. Obtain 2D echo Obtain bilateral lower extremity Doppler ultrasound. Continue heparin drip for now. Switch to DOAC when appropriate. Incentive spirometer Pain control and bowel regimen.  Chronic normocytic anemia Hemoglobin 11.5 with MCV of 95 Nonbleeding internal hemorrhoids seen on colonoscopy in 2021 No overt bleeding reported at this time Monitor H&H for now  GERD Resume home PPI  Hyperlipidemia Resume home Crestor  Severe morbid obesity BMI 38 Recommend weight loss outpatient with regular physical activity and healthy dieting.    DVT prophylaxis: Heparin drip  Code Status: Full code  Family Communication: None at bedside  Disposition Plan: Admitted telemetry medical unit  Consults called: None.  Admission status: Observation status.   Status is: Observation    Kayleen Memos MD [facility] Pager [phone]  If 7PM-7AM, please contact night-coverage [email or web address] Password [email or web address]  05/11/2022, 3:04 AM

## 2022-05-11 NOTE — Hospital Course (Signed)
Haley White is a 66 yo female with PMH HTN, HLD, anxiety, arthritis, obesity who presented with worsening shortness of breath. She recently had COVID-19 in June 2023 and has been even more immobile than her previous state.  She has been using electric wheelchairs when going to the stores and is not active at home.  She underwent evaluation at her pulmonologist office including CTA chest.  This was found to be positive for right-sided PE and due to tachycardia, she was referred to the ER for further work-up. She was initially started on a heparin drip.  Echo was ordered.  She was transitioned to Eliquis after anticoagulation discussion.

## 2022-05-11 NOTE — Discharge Instructions (Signed)
Information on my medicine - ELIQUIS (apixaban)  Why was Eliquis prescribed for you? Eliquis was prescribed to treat blood clots that may have been found in the veins of your legs (deep vein thrombosis) or in your lungs (pulmonary embolism) and to reduce the risk of them occurring again.  What do You need to know about Eliquis ? The starting dose is 10 mg (two 5 mg tablets) taken TWICE daily for the FIRST SEVEN (7) DAYS, then on 05/18/2022 the dose is reduced to ONE 5 mg tablet taken TWICE daily.  Eliquis may be taken with or without food.   Try to take the dose about the same time in the morning and in the evening. If you have difficulty swallowing the tablet whole please discuss with your pharmacist how to take the medication safely.  Take Eliquis exactly as prescribed and DO NOT stop taking Eliquis without talking to the doctor who prescribed the medication.  Stopping may increase your risk of developing a new blood clot.  Refill your prescription before you run out.  After discharge, you should have regular check-up appointments with your healthcare provider that is prescribing your Eliquis.    What do you do if you miss a dose? If a dose of ELIQUIS is not taken at the scheduled time, take it as soon as possible on the same day and twice-daily administration should be resumed. The dose should not be doubled to make up for a missed dose.  Important Safety Information A possible side effect of Eliquis is bleeding. You should call your healthcare provider right away if you experience any of the following: Bleeding from an injury or your nose that does not stop. Unusual colored urine (red or dark brown) or unusual colored stools (red or black). Unusual bruising for unknown reasons. A serious fall or if you hit your head (even if there is no bleeding).  Some medicines may interact with Eliquis and might increase your risk of bleeding or clotting while on Eliquis. To help avoid this,  consult your healthcare provider or pharmacist prior to using any new prescription or non-prescription medications, including herbals, vitamins, non-steroidal anti-inflammatory drugs (NSAIDs) and supplements.  This website has more information on Eliquis (apixaban): http://www.eliquis.com/eliquis/home

## 2022-05-11 NOTE — Assessment & Plan Note (Signed)
-   Considered provoked in setting of recent COVID-19 pneumonia as well as poor mobility status at baseline and worsened recently - Started on heparin drip on admission - CTA chest showed right upper lobar and segmental PE - Obtain echo.  Patient unable to be discharged same day due to inability for echo to be done -Anticoagulation discussed and patient elected for transitioning to Eliquis - Outpatient follow-up with pulmonology

## 2022-05-11 NOTE — Progress Notes (Signed)
VASCULAR LAB   Bilateral lower extremity venous duplex has been performed.  See CV proc for preliminary results.   Evelio Rueda, RVT 05/11/2022, 10:53 AM

## 2022-05-11 NOTE — Progress Notes (Signed)
ANTICOAGULATION CONSULT NOTE - Follow Up Consult  Pharmacy Consult for heparin Indication: pulmonary embolus  Labs: Recent Labs    05/10/22 2015 05/11/22 0559  HGB 11.5* 10.7*  HCT 34.4* 33.0*  PLT 289 271  APTT 27  --   LABPROT 12.5  --   INR 0.9  --   HEPARINUNFRC  --  0.11*  CREATININE 0.92  --   TROPONINIHS 3  --     Assessment: 66yo female subtherapeutic on heparin with initial dosing for PE; no infusion issues or signs of bleeding per RN.  Goal of Therapy:  Heparin level 0.3-0.7 units/ml   Plan:  Will rebolus with heparin 3000 units and increase heparin infusion by 3 units/kg/hr to 1500 units/hr and check level in 6 hours.    Wynona Neat, PharmD, BCPS  05/11/2022,6:50 AM

## 2022-05-12 ENCOUNTER — Observation Stay (HOSPITAL_BASED_OUTPATIENT_CLINIC_OR_DEPARTMENT_OTHER): Payer: Medicare HMO

## 2022-05-12 DIAGNOSIS — I2699 Other pulmonary embolism without acute cor pulmonale: Secondary | ICD-10-CM | POA: Diagnosis not present

## 2022-05-12 DIAGNOSIS — I2609 Other pulmonary embolism with acute cor pulmonale: Secondary | ICD-10-CM | POA: Diagnosis not present

## 2022-05-12 LAB — ECHOCARDIOGRAM COMPLETE
Area-P 1/2: 2.13 cm2
Height: 63 in
S' Lateral: 2.6 cm
Weight: 3472 oz

## 2022-05-12 MED ORDER — APIXABAN 5 MG PO TABS: ORAL_TABLET | ORAL | 4 refills | Status: DC

## 2022-05-12 MED ORDER — CELECOXIB 100 MG PO CAPS: 100.0000 mg | ORAL_CAPSULE | Freq: Two times a day (BID) | ORAL | 0 refills | Status: DC | PRN

## 2022-05-12 MED ORDER — PERFLUTREN LIPID MICROSPHERE
1.0000 mL | INTRAVENOUS | Status: DC | PRN
  Administered 2022-05-12: 2 mL via INTRAVENOUS

## 2022-05-12 NOTE — Discharge Summary (Signed)
Physician Discharge Summary   Haley White WUJ:[id number] DOB: 1956/04/13 DOA: 05/10/2022  PCP: Unk Pinto, MD  Admit date: 05/10/2022 Discharge date: 05/12/2022   Admitted From: home  Disposition:  Home Discharging physician: Dwyane Dee, MD  Recommendations for Outpatient Follow-up:  Follow up with pulmonology   Home Health:  Equipment/Devices:   Discharge Condition: stable CODE STATUS: Full Diet recommendation:  Diet Orders (From admission, onward)     Start     Ordered   05/12/22 0000  Diet - low sodium heart healthy        05/12/22 0946   05/11/22 0303  Diet Heart Room service appropriate? Yes; Fluid consistency: Thin  Diet effective now       Question Answer Comment  Room service appropriate? Yes   Fluid consistency: Thin      05/11/22 0302            Hospital Course: Ms. Haley White is a 66 yo female with PMH HTN, HLD, anxiety, arthritis, obesity who presented with worsening shortness of breath. She recently had COVID-19 in June 2023 and has been even more immobile than her previous state.  She has been using electric wheelchairs when going to the stores and is not active at home.  She underwent evaluation at her pulmonologist office including CTA chest.  This was found to be positive for right-sided PE and due to tachycardia, she was referred to the ER for further work-up. She was initially started on a heparin drip.  Echo was ordered.  She was transitioned to Eliquis after anticoagulation discussion.  Assessment and Plan: * Acute pulmonary embolism ([place]) - Considered provoked in setting of recent COVID-19 pneumonia as well as poor mobility status at baseline and worsened recently - Started on heparin drip on admission - CTA chest showed right upper lobar and segmental PE - Echo performed prior to discharge.  No right heart strain -Anticoagulation discussed and patient elected for transitioning to Eliquis - Outpatient follow-up with pulmonology    The  patient's chronic medical conditions were treated accordingly per the patient's home medication regimen except as noted.  On day of discharge, patient was felt deemed stable for discharge. Patient/family member advised to call PCP or come back to ER if needed.   Principal Diagnosis: Acute pulmonary embolism [facility])  Discharge Diagnoses: Active Hospital Problems   Diagnosis Date Noted   Acute pulmonary embolism ([place]) 05/10/2022    Priority: 1.    Resolved Hospital Problems  No resolved problems to display.     Discharge Instructions     Diet - low sodium heart healthy   Complete by: As directed    Increase activity slowly   Complete by: As directed       Allergies as of 05/12/2022       Reactions   Molds & Smuts Anaphylaxis   Biaxin [clarithromycin]    GI upset   Serevent [salmeterol]    Palpitations   Tequin [gatifloxacin]    GI upset. thrush        Medication List     STOP taking these medications    Breztri Aerosphere 160-9-4.8 MCG/ACT Aero Generic drug: Budeson-Glycopyrrol-Formoterol   fluconazole 100 MG tablet Commonly known as: Diflucan   potassium chloride 10 MEQ tablet Commonly known as: KLOR-CON   promethazine-dextromethorphan 6.25-15 MG/5ML syrup Commonly known as: PROMETHAZINE-DM       TAKE these medications    albuterol 108 (90 Base) MCG/ACT inhaler Commonly known as: Ventolin HFA Inhale 2 puffs into the  lungs every 4 (four) hours as needed for wheezing or shortness of breath.   albuterol (2.5 MG/3ML) 0.083% nebulizer solution Commonly known as: PROVENTIL Take 3 mLs (2.5 mg total) by nebulization every 6 (six) hours as needed for wheezing or shortness of breath.   apixaban 5 MG Tabs tablet Commonly known as: ELIQUIS Take 2 tablets twice a day until 7/15 then take 1 tablet twice a day Start taking on: May 18, 2022   budesonide 0.25 MG/2ML nebulizer solution Commonly known as: Pulmicort Take 2 mLs (0.25 mg total) by nebulization in the  morning and at bedtime.   buPROPion 300 MG 24 hr tablet Commonly known as: WELLBUTRIN XL Take 300 mg by mouth every morning.   celecoxib 100 MG capsule Commonly known as: CELEBREX Take 1 capsule (100 mg total) by mouth 2 (two) times daily as needed (pain). What changed: See the new instructions.   diphenhydrAMINE 25 MG tablet Commonly known as: BENADRYL Take 1 tablet (25 mg total) by mouth at bedtime as needed for up to 12 doses for sleep (Headache). Take with reglan as needed at bedtime to help with headache and sleep   EPINEPHrine 0.3 mg/0.3 mL Soaj injection Commonly known as: EPI-PEN See admin instructions.   famotidine 40 MG tablet Commonly known as: PEPCID Take  1 tablet  at Night to Prevent Heartburn & Acid Reflux What changed:  how much to take how to take this when to take this   fexofenadine 180 MG tablet Commonly known as: ALLEGRA Take 180 mg by mouth daily.   FLUoxetine 20 MG capsule Commonly known as: PROZAC Take 1 capsule (20 mg total) by mouth daily. For depression   fluticasone 50 MCG/ACT nasal spray Commonly known as: FLONASE Place 2 sprays into both nostrils daily.   gabapentin 300 MG capsule Commonly known as: NEURONTIN Take 1 capsule  3 x /day  for Shingles Neuritis   levocetirizine 5 MG tablet Commonly known as: XYZAL Take 5 mg by mouth every evening.   lisinopril 20 MG tablet Commonly known as: ZESTRIL TAKE 1 TABLET BY MOUTH DAILY FOR BLOOD PRESSURE   methylphenidate 10 MG tablet Commonly known as: RITALIN Take 10 mg by mouth 2 (two) times daily.   metoCLOPramide 10 MG tablet Commonly known as: REGLAN Take 1 tablet (10 mg total) by mouth every 12 (twelve) hours as needed for up to 12 doses for nausea.   montelukast 10 MG tablet Commonly known as: SINGULAIR Take 10 mg by mouth daily.   pantoprazole 40 MG tablet Commonly known as: Protonix Take  1 tablet  every Morning to Prevent Heartburn & Acid Reflux   rosuvastatin 40 MG  tablet Commonly known as: CRESTOR TAKE 1 TABLET BY MOUTH DAILY FOR CHOLESTEROL What changed: See the new instructions.   Symbicort 160-4.5 MCG/ACT inhaler Generic drug: budesonide-formoterol Inhale 2 puffs into the lungs 2 (two) times daily.   VITAMIN D PO Take 15,000 Units by mouth daily.        Allergies  Allergen Reactions   Molds & Smuts Anaphylaxis   Biaxin [Clarithromycin]     GI upset   Serevent [Salmeterol]     Palpitations   Tequin [Gatifloxacin]     GI upset. thrush    Consultations:   Procedures:   Discharge Exam: BP (!) 149/72 (BP Location: Left Arm)   Pulse (!) 115   Temp 98.2 F (36.8 C) (Oral)   Resp 16   Ht '5\' 3"'$  (1.6 m)   Wt 98.4 kg  LMP 11/26/2011   SpO2 98%   BMI 38.44 kg/m  Physical Exam Constitutional:      Appearance: Normal appearance. She is obese.  HENT:     Head: Normocephalic and atraumatic.     Mouth/Throat:     Mouth: Mucous membranes are moist.  Eyes:     Extraocular Movements: Extraocular movements intact.  Cardiovascular:     Rate and Rhythm: Normal rate and regular rhythm.  Pulmonary:     Effort: Pulmonary effort is normal.     Breath sounds: Normal breath sounds.  Abdominal:     General: Bowel sounds are normal. There is no distension.     Palpations: Abdomen is soft.     Tenderness: There is no abdominal tenderness.  Musculoskeletal:        General: Normal range of motion.     Cervical back: Normal range of motion and neck supple.  Skin:    General: Skin is warm and dry.  Neurological:     General: No focal deficit present.     Mental Status: She is alert.  Psychiatric:        Mood and Affect: Mood normal.        Behavior: Behavior normal.      The results of significant diagnostics from this hospitalization (including imaging, microbiology, ancillary and laboratory) are listed below for reference.   Microbiology: No results found for this or any previous visit (from the past 240 hour(s)).    Labs: BNP (last 3 results) Recent Labs    05/10/22 2015  BNP 78.6   Basic Metabolic Panel: Recent Labs  Lab 05/10/22 2015  NA 144  K 4.0  CL 108  CO2 23  GLUCOSE 106*  BUN 12  CREATININE 0.92  CALCIUM 9.6   Liver Function Tests: No results for input(s): "AST", "ALT", "ALKPHOS", "BILITOT", "PROT", "ALBUMIN" in the last 168 hours. No results for input(s): "LIPASE", "AMYLASE" in the last 168 hours. No results for input(s): "AMMONIA" in the last 168 hours. CBC: Recent Labs  Lab 05/10/22 2015 05/11/22 0559  WBC 7.2 6.5  HGB 11.5* 10.7*  HCT 34.4* 33.0*  MCV 95.3 98.2  PLT 289 271   Cardiac Enzymes: No results for input(s): "CKTOTAL", "CKMB", "CKMBINDEX", "TROPONINI" in the last 168 hours. BNP: Invalid input(s): "POCBNP" CBG: No results for input(s): "GLUCAP" in the last 168 hours. D-Dimer No results for input(s): "DDIMER" in the last 72 hours. Hgb A1c No results for input(s): "HGBA1C" in the last 72 hours. Lipid Profile No results for input(s): "CHOL", "HDL", "LDLCALC", "TRIG", "CHOLHDL", "LDLDIRECT" in the last 72 hours. Thyroid function studies No results for input(s): "TSH", "T4TOTAL", "T3FREE", "THYROIDAB" in the last 72 hours.  Invalid input(s): "FREET3" Anemia work up No results for input(s): "VITAMINB12", "FOLATE", "FERRITIN", "TIBC", "IRON", "RETICCTPCT" in the last 72 hours. Urinalysis    Component Value Date/Time   COLORURINE YELLOW 01/10/2021 0852   APPEARANCEUR CLEAR 01/10/2021 0852   LABSPEC 1.026 01/10/2021 0852   PHURINE 5.5 01/10/2021 0852   GLUCOSEU NEGATIVE 01/10/2021 0852   HGBUR NEGATIVE 01/10/2021 0852   BILIRUBINUR NEGATIVE 09/09/2013 1149   KETONESUR NEGATIVE 01/10/2021 0852   PROTEINUR NEGATIVE 01/10/2021 0852   UROBILINOGEN 0.2 09/09/2013 1149   NITRITE NEGATIVE 01/10/2021 0852   LEUKOCYTESUR NEGATIVE 01/10/2021 0852   Sepsis Labs Recent Labs  Lab 05/10/22 2015 05/11/22 0559  WBC 7.2 6.5   Microbiology No results  found for this or any previous visit (from the past 240 hour(s)).  Procedures/Studies: ECHOCARDIOGRAM  COMPLETE  Result Date: 05/12/2022    ECHOCARDIOGRAM REPORT   Patient Name:   DEJANIQUE RUEHL Date of Exam: 05/12/2022 Medical Rec #:  [id number]    Height:       63.0 in Accession #:    [id number]   Weight:       217.0 lb Date of Birth:  Jan 22, 1956    BSA:          2.002 m Patient Age:    58 years     BP:           149/72 mmHg Patient Gender: F            HR:           88 bpm. Exam Location:  Inpatient Procedure: 2D Echo, Color Doppler, Cardiac Doppler and Intracardiac            Opacification Agent Indications:    Pulmonary Embolus I26.09  History:        Patient has no prior history of Echocardiogram examinations.                 Risk Factors:Hypertension and Dyslipidemia.  Sonographer:    Darlina Sicilian RDCS Referring Phys: Kayleen Memos  Sonographer Comments: Suboptimal apical window. IMPRESSIONS  1. Left ventricular ejection fraction, by estimation, is 60 to 65%. The left ventricle has normal function. The left ventricle has no regional wall motion abnormalities. Left ventricular diastolic parameters are consistent with Grade I diastolic dysfunction (impaired relaxation).  2. Right ventricular systolic function is normal. The right ventricular size is normal.  3. The mitral valve is normal in structure. No evidence of mitral valve regurgitation. No evidence of mitral stenosis.  4. The aortic valve is normal in structure. Aortic valve regurgitation is not visualized. No aortic stenosis is present.  5. The inferior vena cava is normal in size with greater than 50% respiratory variability, suggesting right atrial pressure of 3 mmHg. FINDINGS  Left Ventricle: Left ventricular ejection fraction, by estimation, is 60 to 65%. The left ventricle has normal function. The left ventricle has no regional wall motion abnormalities. Definity contrast agent was given IV to delineate the left ventricular  endocardial borders.  The left ventricular internal cavity size was normal in size. There is no left ventricular hypertrophy. Left ventricular diastolic parameters are consistent with Grade I diastolic dysfunction (impaired relaxation). Right Ventricle: The right ventricular size is normal. No increase in right ventricular wall thickness. Right ventricular systolic function is normal. Left Atrium: Left atrial size was normal in size. Right Atrium: Right atrial size was normal in size. Pericardium: There is no evidence of pericardial effusion. Mitral Valve: The mitral valve is normal in structure. No evidence of mitral valve regurgitation. No evidence of mitral valve stenosis. Tricuspid Valve: The tricuspid valve is normal in structure. Tricuspid valve regurgitation is not demonstrated. No evidence of tricuspid stenosis. Aortic Valve: The aortic valve is normal in structure. Aortic valve regurgitation is not visualized. No aortic stenosis is present. Pulmonic Valve: The pulmonic valve was normal in structure. Pulmonic valve regurgitation is not visualized. No evidence of pulmonic stenosis. Aorta: The aortic root is normal in size and structure. Venous: The inferior vena cava is normal in size with greater than 50% respiratory variability, suggesting right atrial pressure of 3 mmHg. IAS/Shunts: No atrial level shunt detected by color flow Doppler.  LEFT VENTRICLE PLAX 2D LVIDd:         4.10 cm   Diastology LVIDs:  2.60 cm   LV e' medial:    6.31 cm/s LV PW:         0.90 cm   LV E/e' medial:  8.6 LV IVS:        1.00 cm   LV e' lateral:   5.77 cm/s LVOT diam:     2.00 cm   LV E/e' lateral: 9.4 LV SV:         47 LV SV Index:   24 LVOT Area:     3.14 cm  RIGHT VENTRICLE RV S prime:     9.25 cm/s LEFT ATRIUM         Index LA diam:    3.30 cm 1.65 cm/m  AORTIC VALVE LVOT Vmax:   81.90 cm/s LVOT Vmean:  51.000 cm/s LVOT VTI:    0.150 m  AORTA Ao Root diam: 2.70 cm MITRAL VALVE MV Area (PHT): 2.13 cm    SHUNTS MV Decel Time: 356 msec     Systemic VTI:  0.15 m MV E velocity: 54.20 cm/s  Systemic Diam: 2.00 cm MV A velocity: 77.70 cm/s MV E/A ratio:  0.70 Candee Furbish MD Electronically signed by Candee Furbish MD Signature Date/Time: 05/12/2022/12:51:41 PM    Final    VAS [place] LOWER EXTREMITY VENOUS (DVT)  Result Date: 05/11/2022  Lower Venous DVT Study Patient Name:  KATRYNA TSCHIRHART  Date of Exam:   05/11/2022 Medical Rec #: [id number]     Accession #:    [id number] Date of Birth: July 04, 1956     Patient Gender: F Patient Age:   30 years Exam Location:  [facility] Procedure:      VAS [place] LOWER EXTREMITY VENOUS (DVT) Referring Phys: Irene Pap --------------------------------------------------------------------------------  Indications: Pulmonary embolism.  Risk Factors: Recent Covid and Covid pneumonia. Comparison Study: No prior study on file Performing Technologist: Sharion Dove RVS  Examination Guidelines: A complete evaluation includes B-mode imaging, spectral Doppler, color Doppler, and power Doppler as needed of all accessible portions of each vessel. Bilateral testing is considered an integral part of a complete examination. Limited examinations for reoccurring indications may be performed as noted. The reflux portion of the exam is performed with the patient in reverse Trendelenburg.  +---------+---------------+---------+-----------+----------+--------------+ RIGHT    CompressibilityPhasicitySpontaneityPropertiesThrombus Aging +---------+---------------+---------+-----------+----------+--------------+ CFV      Full           Yes      Yes                                 +---------+---------------+---------+-----------+----------+--------------+ SFJ      Full                                                        +---------+---------------+---------+-----------+----------+--------------+ FV Prox  Full                                                         +---------+---------------+---------+-----------+----------+--------------+ FV Mid   Full                                                        +---------+---------------+---------+-----------+----------+--------------+  FV DistalFull                                                        +---------+---------------+---------+-----------+----------+--------------+ PFV      Full                                                        +---------+---------------+---------+-----------+----------+--------------+ POP      Full           Yes      Yes                                 +---------+---------------+---------+-----------+----------+--------------+ PTV      Full                                                        +---------+---------------+---------+-----------+----------+--------------+ PERO     Full                                                        +---------+---------------+---------+-----------+----------+--------------+ Soleal   Full                                                        +---------+---------------+---------+-----------+----------+--------------+ Gastroc  Full                                                        +---------+---------------+---------+-----------+----------+--------------+   +---------+---------------+---------+-----------+----------+--------------+ LEFT     CompressibilityPhasicitySpontaneityPropertiesThrombus Aging +---------+---------------+---------+-----------+----------+--------------+ CFV      Full           Yes      Yes                                 +---------+---------------+---------+-----------+----------+--------------+ SFJ      Full                                                        +---------+---------------+---------+-----------+----------+--------------+ FV Prox  Full                                                         +---------+---------------+---------+-----------+----------+--------------+  FV Mid   Full                                                        +---------+---------------+---------+-----------+----------+--------------+ FV DistalFull                                                        +---------+---------------+---------+-----------+----------+--------------+ PFV      Full                                                        +---------+---------------+---------+-----------+----------+--------------+ POP      Full           Yes      Yes                                 +---------+---------------+---------+-----------+----------+--------------+ PTV      Full                                                        +---------+---------------+---------+-----------+----------+--------------+ PERO     Full                                                        +---------+---------------+---------+-----------+----------+--------------+ Gastroc  Full                                                        +---------+---------------+---------+-----------+----------+--------------+     Summary: BILATERAL: - No evidence of deep vein thrombosis seen in the lower extremities, bilaterally. -No evidence of popliteal cyst, bilaterally.   *See table(s) above for measurements and observations.    Preliminary    CT Angio Chest Pulmonary Embolism (PE) W or WO Contrast  Result Date: 05/10/2022 CLINICAL DATA:  Evaluate for pulmonary embolism. Post COVID pneumonia. EXAM: CT ANGIOGRAPHY CHEST WITH CONTRAST TECHNIQUE: Multidetector CT imaging of the chest was performed using the standard protocol during bolus administration of intravenous contrast. Multiplanar CT image reconstructions and MIPs were obtained to evaluate the vascular anatomy. RADIATION DOSE REDUCTION: This exam was performed according to the departmental dose-optimization program which includes automated exposure control,  adjustment of the mA and/or kV according to patient size and/or use of iterative reconstruction technique. CONTRAST:  <See Chart> OMNIPAQUE IOHEXOL 300 MG/ML SOLN, 106m OMNIPAQUE IOHEXOL 350 MG/ML SOLN COMPARISON:  CT angiogram chest 04/14/2022. FINDINGS: Cardiovascular: There are pulmonary emboli within the distal right upper lobe pulmonary artery extending into segmental branches.  Heart and aorta appear within normal limits. There is no pericardial effusion. Mediastinum/Nodes: No enlarged mediastinal, hilar, or axillary lymph nodes. Thyroid gland, trachea, and esophagus demonstrate no significant findings. Lungs/Pleura: The lungs are now clear. There is no pleural effusion or pneumothorax. Upper Abdomen: Small left peripelvic cysts are present. Musculoskeletal: No chest wall abnormality. No acute osseous abnormality. Multilevel degenerative changes affect the spine. Review of the MIP images confirms the above findings. IMPRESSION: 1. Right upper lobar and segmental pulmonary emboli. No evidence for right heart strain. 2. The lungs are now clear. Electronically Signed   By: Ronney Asters M.D.   On: 05/10/2022 18:06   DG Chest 2 View  Result Date: 05/03/2022 CLINICAL DATA:  Hx of covid pna 03/23, continued sob, cough congestion, mid chest discomfort, hx of asthma, non smoker EXAM: CHEST - 2 VIEW COMPARISON:  04/14/2022. FINDINGS: Cardiac silhouette is normal in size. No mediastinal or hilar masses or evidence of adenopathy. Clear lungs.  No pleural effusion or pneumothorax. Skeletal structures are intact. IMPRESSION: No active cardiopulmonary disease. Electronically Signed   By: Lajean Manes M.D.   On: 05/03/2022 15:17   CT Angio Chest PE W and/or Wo Contrast  Result Date: 04/14/2022 CLINICAL DATA:  Pulmonary embolism (PE) suspected, positive D-dimer. Dyspnea, elevated D-dimer, recent COVID infection. EXAM: CT ANGIOGRAPHY CHEST WITH CONTRAST TECHNIQUE: Multidetector CT imaging of the chest was performed  using the standard protocol during bolus administration of intravenous contrast. Multiplanar CT image reconstructions and MIPs were obtained to evaluate the vascular anatomy. RADIATION DOSE REDUCTION: This exam was performed according to the departmental dose-optimization program which includes automated exposure control, adjustment of the mA and/or kV according to patient size and/or use of iterative reconstruction technique. CONTRAST:  32m OMNIPAQUE IOHEXOL 350 MG/ML SOLN COMPARISON:  None Available. FINDINGS: Cardiovascular: Adequate opacification of the pulmonary arterial tree. No intraluminal filling defect identified to suggest acute pulmonary embolism. Central pulmonary arteries are of normal caliber. No significant coronary artery calcification. Cardiac size within normal limits. No pericardial effusion. No significant atherosclerotic calcification within the thoracic aorta. No aortic aneurysm. Mediastinum/Nodes: No enlarged mediastinal, hilar, or axillary lymph nodes. Thyroid gland, trachea, and esophagus demonstrate no significant findings. Lungs/Pleura: Patchy asymmetric ground-glass pulmonary infiltrate within the mid and lower lung zones bilaterally, more prevalent within the left perihilar region and right lower lobe subpleurally is likely infectious or inflammatory in nature in can be seen the setting of COVID-19 pneumonia. No pneumothorax or pleural effusion. Central airways are widely patent. Upper Abdomen: Probable parapelvic cysts within the visualized left kidney appear enlarged when compared to remote prior examination of 04/05/2012, but are not well characterized on this examination. No acute abnormality identified. Musculoskeletal: No acute bone abnormality. No lytic or blastic bone lesion. Review of the MIP images confirms the above findings. IMPRESSION: No pulmonary embolism. Multifocal pulmonary infiltrates, likely infectious or inflammatory and compatible with reported recent COVID-19  pneumonia. Electronically Signed   By: AFidela SalisburyM.D.   On: 04/14/2022 18:42   DG Chest 2 View  Result Date: 04/14/2022 CLINICAL DATA:  Shortness of breath EXAM: CHEST - 2 VIEW COMPARISON:  None Available. FINDINGS: Cardiomediastinal silhouette is unchanged. There is no focal airspace consolidation. No pleural effusion. No pneumothorax. No acute osseous abnormality. 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 IMPRESSION: No evidence of acute cardiopulmonary disease. Electronically Signed   By: JMaurine SimmeringM.D.   On:  04/14/2022 16:09     Time coordinating discharge: Over 30 minutes    Dwyane Dee, MD  [facility] 05/12/2022, 2:29 PM

## 2022-05-12 NOTE — Plan of Care (Signed)

## 2022-05-16 ENCOUNTER — Ambulatory Visit (INDEPENDENT_AMBULATORY_CARE_PROVIDER_SITE_OTHER): Payer: Medicare HMO | Admitting: Nurse Practitioner

## 2022-05-16 VITALS — BP 136/82 | HR 75 | Temp 96.6°F | Resp 19 | Ht 63.0 in | Wt 215.4 lb

## 2022-05-16 DIAGNOSIS — R0609 Other forms of dyspnea: Secondary | ICD-10-CM

## 2022-05-16 DIAGNOSIS — U071 COVID-19: Secondary | ICD-10-CM | POA: Diagnosis not present

## 2022-05-16 DIAGNOSIS — I2699 Other pulmonary embolism without acute cor pulmonale: Secondary | ICD-10-CM

## 2022-05-16 DIAGNOSIS — J1282 Pneumonia due to coronavirus disease 2019: Secondary | ICD-10-CM

## 2022-05-16 NOTE — Progress Notes (Signed)
Assessment and Plan:  Haley White was seen today for a follow up.  Diagnoses and all order for this visit:  1. Acute pulmonary embolism, unspecified pulmonary embolism type, unspecified whether acute cor pulmonale present (HCC) Continue Eliquis. Continue close f/u with Pulmonology  2. Pneumonia due to COVID-19 virus Resolving.  3. DOE (dyspnea on exertion) Continue inhalers/nebulizers   Notify office for further evaluation and treatment, questions or concerns if s/s fail to improve. The risks and benefits of my recommendations, as well as other treatment options were discussed with the patient today. Questions were answered.  Further disposition pending results of labs. Discussed med's effects and SE's.    Over 20 minutes of exam, counseling, chart review, and critical decision making was performed.   Future Appointments  Date Time Provider [place]  06/04/2022  9:15 AM Trula Slade, DPM [facility]  06/14/2022 12:00 PM [facility] P[facility] None  06/14/2022  1:45 PM Spero Geralds, MD [facility] None  07/01/2022  9:45 AM Freddi Starr, MD [facility] None  08/02/2022 10:30 AM Unk Pinto, MD [facility] None  01/22/2023 10:00 AM Unk Pinto, MD [facility] None  04/24/2023 11:00 AM Darrol Jump, NP [facility] None    ------------------------------------------------------------------------------------------------------------------   HPI BP 136/82   Pulse 75   Temp (!) 96.6 F (35.9 C)   Resp 19   Ht '5\' 3"'$  (1.6 m)   Wt 215 lb 6.4 oz (97.7 kg)   LMP 11/26/2011   SpO2 99%   BMI 38.16 kg/m   Patient was seen on 05/12/22 for worsening Covid PNA with SOB.  She recently had COVID in June of 2023 and has been even more immobile than her previous state, using electric wheelchairs when going to the stores. Not active at home. She was seen in office recently. During that time, we reached out to pulmonology for a sooner assessment  (within the week) for DOE/SOB, rather than a general 3 mo f/u.  When she followed up with pulmonology in office, she was sent straight to ER and found to be positive for right sided PE. She started on Heparin gtt. She was transitioned to Eliquis after anti coagulation discussed.  Today, she is feeling somewhat better. She continues to have DOE/SOB. She is compliant with medications    Past Medical History:  Diagnosis Date   Anxiety    Arthritis    Asthma    Cataract    Gout    Hyperlipidemia    Hypertension    Obese      Allergies  Allergen Reactions   Molds & Smuts Anaphylaxis   Biaxin [Clarithromycin]     GI upset   Serevent [Salmeterol]     Palpitations   Tequin [Gatifloxacin]     GI upset. thrush    Current Outpatient Medications on File Prior to Visit  Medication Sig   albuterol (PROVENTIL) (2.5 MG/3ML) 0.083% nebulizer solution Take 3 mLs (2.5 mg total) by nebulization every 6 (six) hours as needed for wheezing or shortness of breath.   albuterol (VENTOLIN HFA) 108 (90 Base) MCG/ACT inhaler Inhale 2 puffs into the lungs every 4 (four) hours as needed for wheezing or shortness of breath.   [START ON 05/18/2022] apixaban (ELIQUIS) 5 MG TABS tablet Take 2 tablets twice a day until 7/15 then take 1 tablet twice a day   budesonide (PULMICORT) 0.25 MG/2ML nebulizer solution Take 2 mLs (0.25 mg total) by nebulization in the morning and at bedtime.   buPROPion [facility]  XL) 300 MG 24 hr tablet Take 300 mg by mouth every morning.   celecoxib (CELEBREX) 100 MG capsule Take 1 capsule (100 mg total) by mouth 2 (two) times daily as needed (pain).   diphenhydrAMINE (BENADRYL) 25 MG tablet Take 1 tablet (25 mg total) by mouth at bedtime as needed for up to 12 doses for sleep (Headache). Take with reglan as needed at bedtime to help with headache and sleep   EPINEPHrine 0.3 mg/0.3 mL IJ SOAJ injection See admin instructions.   famotidine (PEPCID) 40 MG tablet Take  1 tablet  at Night to  Prevent Heartburn & Acid Reflux (Patient taking differently: Take 40 mg by mouth at bedtime. Take  1 tablet  at Night to Prevent Heartburn & Acid Reflux)   fexofenadine (ALLEGRA) 180 MG tablet Take 180 mg by mouth daily.   FLUoxetine (PROZAC) 20 MG capsule Take 1 capsule (20 mg total) by mouth daily. For depression   fluticasone (FLONASE) 50 MCG/ACT nasal spray Place 2 sprays into both nostrils daily.   gabapentin (NEURONTIN) 300 MG capsule Take 1 capsule  3 x /day  for Shingles Neuritis   levocetirizine (XYZAL) 5 MG tablet Take 5 mg by mouth every evening.   lisinopril (ZESTRIL) 20 MG tablet TAKE 1 TABLET BY MOUTH DAILY FOR BLOOD PRESSURE   methylphenidate (RITALIN) 10 MG tablet Take 10 mg by mouth 2 (two) times daily.   metoCLOPramide (REGLAN) 10 MG tablet Take 1 tablet (10 mg total) by mouth every 12 (twelve) hours as needed for up to 12 doses for nausea.   montelukast (SINGULAIR) 10 MG tablet Take 10 mg by mouth daily.   pantoprazole (PROTONIX) 40 MG tablet Take  1 tablet  every Morning to Prevent Heartburn & Acid Reflux   rosuvastatin (CRESTOR) 40 MG tablet TAKE 1 TABLET BY MOUTH DAILY FOR CHOLESTEROL (Patient taking differently: Take 40 mg by mouth daily.)   SYMBICORT 160-4.5 MCG/ACT inhaler Inhale 2 puffs into the lungs 2 (two) times daily.   VITAMIN D PO Take 15,000 Units by mouth daily.   No current facility-administered medications on file prior to visit.    ROS: all negative except what is noted in the HPI.    Physical Exam:  BP 136/82   Pulse 75   Temp (!) 96.6 F (35.9 C)   Resp 19   Ht '5\' 3"'$  (1.6 m)   Wt 215 lb 6.4 oz (97.7 kg)   LMP 11/26/2011   SpO2 99%   BMI 38.16 kg/m   General Appearance: NAD.  Awake, conversant and cooperative. Eyes: PERRLA, EOMs intact.  Sclera white.  Conjunctiva without erythema. Sinuses: No frontal/maxillary tenderness.  No nasal discharge. Nares patent.  ENT/Mouth: Ext aud canals clear.  Bilateral TMs w/DOL and without erythema or  bulging. Hearing intact.  Posterior pharynx without swelling or exudate.  Tonsils without swelling or erythema.  Neck: Supple.  No masses, nodules or thyromegaly. Respiratory: Effort is regular with non-labored breathing. Breath sounds are equal bilaterally without rales, rhonchi, wheezing or stridor.  Cardio: RRR with no MRGs. Brisk peripheral pulses without edema.  Abdomen: Active BS in all four quadrants.  Soft and non-tender without guarding, rebound tenderness, hernias or masses. Lymphatics: Non tender without lymphadenopathy.  Musculoskeletal: Full ROM, 5/5 strength, normal ambulation.  No clubbing or cyanosis. Skin: Appropriate color for ethnicity. Warm without rashes, lesions, ecchymosis, ulcers.  Neuro: CN II-XII grossly normal. Normal muscle tone without cerebellar symptoms and intact sensation.   Psych: AO X 3,  appropriate mood and affect, insight and judgment.     Darrol Jump, NP 11:03 AM [facility]

## 2022-05-16 NOTE — Patient Instructions (Signed)
Ubrogepant Tablets What is this medication? UBROGEPANT (ue BROE je pant) treats migraines. It works by blocking a substance in the body that causes migraines. It is not used to prevent migraines. This medicine may be used for other purposes; ask your health care provider or pharmacist if you have questions. COMMON BRAND NAME(S): Roselyn Haley White What should I tell my care team before I take this medication? They need to know if you have any of these conditions: Kidney disease Liver disease An unusual or allergic reaction to ubrogepant, other medications, foods, dyes, or preservatives Pregnant or trying to get pregnant Breast-feeding How should I use this medication? Take this medication by mouth with a glass of water. Take it as directed on the prescription label. You can take it with or without food. If it upsets your stomach, take it with food. Keep taking it unless your care team tells you to stop. Talk to your care team about the use of this medication in children. Special care may be needed. Overdosage: If you think you have taken too much of this medicine contact a poison control center or emergency room at once. NOTE: This medicine is only for you. Do not share this medicine with others. What if I miss a dose? This does not apply. This medication is not for regular use. What may interact with this medication? Do not take this medication with any of the following: Adagrasib Ceritinib Certain antibiotics, such as chloramphenicol, clarithromycin, telithromycin Certain antivirals for HIV, such as atazanavir, cobicistat, darunavir, delavirdine, fosamprenavir, indinavir, ritonavir Certain medications for fungal infections, such as itraconazole, ketoconazole, posaconazole, voriconazole Conivaptan Grapefruit Idelalisib Mifepristone Nefazodone Ribociclib This medication may also interact with the following: Carvedilol Certain medications for seizures, such as phenobarbital,  phenytoin Ciprofloxacin Cyclosporine Eltrombopag Fluconazole Fluvoxamine Quinidine Rifampin St. John's wort Verapamil This list may not describe all possible interactions. Give your health care provider a list of all the medicines, herbs, non-prescription drugs, or dietary supplements you use. Also tell them if you smoke, drink alcohol, or use illegal drugs. Some items may interact with your medicine. What should I watch for while using this medication? Visit your care team for regular checks on your progress. Tell your care team if your symptoms do not start to get better or if they get worse. Your mouth may get dry. Chewing sugarless gum or sucking hard candy and drinking plenty of water may help. Contact your care team if the problem does not go away or is severe. What side effects may I notice from receiving this medication? Side effects that you should report to your care team as soon as possible: Allergic reactions--skin rash, itching, hives, swelling of the face, lips, tongue, or throat Side effects that usually do not require medical attention (report to your care team if they continue or are bothersome): Drowsiness Dry mouth Fatigue Nausea This list may not describe all possible side effects. Call your doctor for medical advice about side effects. You may report side effects to FDA at 1-800-FDA-1088. Where should I keep my medication? Keep out of the reach of children and pets. Store between 15 and 30 degrees C (59 and 86 degrees F). Get rid of any unused medication after the expiration date. To get rid of medications that are no longer needed or have expired: Take the medication to a medication take-back program. Check with your pharmacy or law enforcement to find a location. If you cannot return the medication, check the label or package insert to see if the medication  should be thrown out in the garbage or flushed down the toilet. If you are not sure, ask your care team. If it  is safe to put it in the trash, pour the medication out of the container. Mix the medication with cat litter, dirt, coffee grounds, or other unwanted substance. Seal the mixture in a bag or container. Put it in the trash. NOTE: This sheet is a summary. It may not cover all possible information. If you have questions about this medicine, talk to your doctor, pharmacist, or health care provider.  2023 Elsevier/Gold Standard (2021-12-14 00:00:00)

## 2022-05-19 ENCOUNTER — Other Ambulatory Visit: Payer: Self-pay | Admitting: Podiatry

## 2022-05-19 DIAGNOSIS — F331 Major depressive disorder, recurrent, moderate: Secondary | ICD-10-CM | POA: Diagnosis not present

## 2022-05-19 DIAGNOSIS — R69 Illness, unspecified: Secondary | ICD-10-CM | POA: Diagnosis not present

## 2022-05-19 DIAGNOSIS — F411 Generalized anxiety disorder: Secondary | ICD-10-CM | POA: Diagnosis not present

## 2022-05-19 DIAGNOSIS — Z62820 Parent-biological child conflict: Secondary | ICD-10-CM | POA: Diagnosis not present

## 2022-05-26 DIAGNOSIS — F331 Major depressive disorder, recurrent, moderate: Secondary | ICD-10-CM | POA: Diagnosis not present

## 2022-05-26 DIAGNOSIS — Z62820 Parent-biological child conflict: Secondary | ICD-10-CM | POA: Diagnosis not present

## 2022-05-26 DIAGNOSIS — R69 Illness, unspecified: Secondary | ICD-10-CM | POA: Diagnosis not present

## 2022-05-26 DIAGNOSIS — F411 Generalized anxiety disorder: Secondary | ICD-10-CM | POA: Diagnosis not present

## 2022-05-30 ENCOUNTER — Other Ambulatory Visit: Payer: Self-pay | Admitting: Nurse Practitioner

## 2022-05-30 ENCOUNTER — Encounter: Payer: Self-pay | Admitting: Nurse Practitioner

## 2022-05-30 ENCOUNTER — Ambulatory Visit (INDEPENDENT_AMBULATORY_CARE_PROVIDER_SITE_OTHER): Payer: Medicare HMO | Admitting: Nurse Practitioner

## 2022-05-30 VITALS — BP 114/82 | HR 99 | Temp 97.3°F | Ht 63.0 in | Wt 215.0 lb

## 2022-05-30 DIAGNOSIS — J1282 Pneumonia due to coronavirus disease 2019: Secondary | ICD-10-CM

## 2022-05-30 DIAGNOSIS — R0609 Other forms of dyspnea: Secondary | ICD-10-CM

## 2022-05-30 DIAGNOSIS — U071 COVID-19: Secondary | ICD-10-CM | POA: Diagnosis not present

## 2022-05-30 DIAGNOSIS — I10 Essential (primary) hypertension: Secondary | ICD-10-CM

## 2022-05-30 DIAGNOSIS — I2699 Other pulmonary embolism without acute cor pulmonale: Secondary | ICD-10-CM | POA: Diagnosis not present

## 2022-05-30 NOTE — Progress Notes (Signed)
Assessment and Plan:  Haley White was seen today for a follow up.  Diagnoses and all order for this visit:  1. Acute pulmonary embolism, unspecified pulmonary embolism type, unspecified whether acute cor pulmonale present (HCC) Continue Eliquis. Continue close f/u with Pulmonology Has apt scheduled 06/04/22  2. Pneumonia due to COVID-19 virus Resolving.  3. DOE (dyspnea on exertion) Continue inhalers/nebulizer Continue to remain active, water aerobics   Notify office for further evaluation and treatment, questions or concerns if s/s fail to improve. The risks and benefits of my recommendations, as well as other treatment options were discussed with the patient today. Questions were answered.  Further disposition pending results of labs. Discussed med's effects and SE's.    Over 20 minutes of exam, counseling, chart review, and critical decision making was performed.   Future Appointments  Date Time Provider [place]  06/04/2022  9:15 AM Trula Slade, DPM [facility]  06/14/2022 12:00 PM [facility] None  06/14/2022  1:45 PM Spero Geralds, MD [facility] None  07/01/2022  9:45 AM Freddi Starr, MD [facility] None  08/02/2022 10:30 AM Unk Pinto, MD [facility] None  01/22/2023 10:00 AM Unk Pinto, MD [facility] None  04/24/2023 11:00 AM Darrol Jump, NP [facility] None    ------------------------------------------------------------------------------------------------------------------   HPI BP 114/82   Pulse 99   Temp (!) 97.3 F (36.3 C)   Ht '5\' 3"'$  (1.6 m)   Wt 215 lb (97.5 kg)   LMP 11/26/2011   SpO2 97%   BMI 38.09 kg/m   Patient presents today for general close follow up for long covid.  Overall she reports feeling much better.  More back to baseline other than continued DOE or when talking.  She is continuing o use inhalers, nebulizer BID, Singulair, antihistamine.    In past she was seen on  05/12/22 for worsening Covid PNA with SOB.  She recently had COVID in June of 2023 and has been even more immobile than her previous state, using electric wheelchairs when going to the stores. Not active at home. She was seen in office recently. During that time, we reached out to pulmonology for a sooner assessment (within the week) for DOE/SOB, rather than a general 3 mo f/u.  When she followed up with pulmonology in office, she was sent straight to ER and found to be positive for right sided PE. She started on Heparin gtt. She was transitioned to Eliquis after anti coagulation discussed.  She continues Eliquis.    She will follow up with Pulmonology 06/04/22.   Past Medical History:  Diagnosis Date   Anxiety    Arthritis    Asthma    Cataract    Gout    Hyperlipidemia    Hypertension    Obese      Allergies  Allergen Reactions   Molds & Smuts Anaphylaxis   Biaxin [Clarithromycin]     GI upset   Serevent [Salmeterol]     Palpitations   Tequin [Gatifloxacin]     GI upset. thrush    Current Outpatient Medications on File Prior to Visit  Medication Sig   albuterol (PROVENTIL) (2.5 MG/3ML) 0.083% nebulizer solution Take 3 mLs (2.5 mg total) by nebulization every 6 (six) hours as needed for wheezing or shortness of breath.   albuterol (VENTOLIN HFA) 108 (90 Base) MCG/ACT inhaler Inhale 2 puffs into the lungs every 4 (four) hours as needed for wheezing or shortness of breath.   apixaban (ELIQUIS) 5 MG  TABS tablet Take 2 tablets twice a day until 7/15 then take 1 tablet twice a day   budesonide (PULMICORT) 0.25 MG/2ML nebulizer solution Take 2 mLs (0.25 mg total) by nebulization in the morning and at bedtime.   buPROPion (WELLBUTRIN XL) 300 MG 24 hr tablet Take 300 mg by mouth every morning.   celecoxib (CELEBREX) 100 MG capsule TAKE 1 CAPSULE(100 MG) BY MOUTH TWICE DAILY   diphenhydrAMINE (BENADRYL) 25 MG tablet Take 1 tablet (25 mg total) by mouth at bedtime as needed for up to 12  doses for sleep (Headache). Take with reglan as needed at bedtime to help with headache and sleep   EPINEPHrine 0.3 mg/0.3 mL IJ SOAJ injection See admin instructions.   famotidine (PEPCID) 40 MG tablet Take  1 tablet  at Night to Prevent Heartburn & Acid Reflux (Patient taking differently: Take 40 mg by mouth at bedtime. Take  1 tablet  at Night to Prevent Heartburn & Acid Reflux)   fexofenadine (ALLEGRA) 180 MG tablet Take 180 mg by mouth daily.   FLUoxetine (PROZAC) 20 MG capsule Take 1 capsule (20 mg total) by mouth daily. For depression   fluticasone (FLONASE) 50 MCG/ACT nasal spray Place 2 sprays into both nostrils daily.   gabapentin (NEURONTIN) 300 MG capsule Take 1 capsule  3 x /day  for Shingles Neuritis   levocetirizine (XYZAL) 5 MG tablet Take 5 mg by mouth every evening.   methylphenidate (RITALIN) 10 MG tablet Take 10 mg by mouth 2 (two) times daily.   metoCLOPramide (REGLAN) 10 MG tablet Take 1 tablet (10 mg total) by mouth every 12 (twelve) hours as needed for up to 12 doses for nausea.   montelukast (SINGULAIR) 10 MG tablet Take 10 mg by mouth daily.   pantoprazole (PROTONIX) 40 MG tablet Take  1 tablet  every Morning to Prevent Heartburn & Acid Reflux   rosuvastatin (CRESTOR) 40 MG tablet TAKE 1 TABLET BY MOUTH DAILY FOR CHOLESTEROL (Patient taking differently: Take 40 mg by mouth daily.)   SYMBICORT 160-4.5 MCG/ACT inhaler Inhale 2 puffs into the lungs 2 (two) times daily.   VITAMIN D PO Take 15,000 Units by mouth daily.   No current facility-administered medications on file prior to visit.    ROS: all negative except what is noted in the HPI.    Physical Exam:  BP 114/82   Pulse 99   Temp (!) 97.3 F (36.3 C)   Ht '5\' 3"'$  (1.6 m)   Wt 215 lb (97.5 kg)   LMP 11/26/2011   SpO2 97%   BMI 38.09 kg/m   General Appearance: NAD.  Awake, conversant and cooperative. Eyes: PERRLA, EOMs intact.  Sclera white.  Conjunctiva without erythema. Sinuses: No frontal/maxillary  tenderness.  No nasal discharge. Nares patent.  ENT/Mouth: Ext aud canals clear.  Bilateral TMs w/DOL and without erythema or bulging. Hearing intact.  Posterior pharynx without swelling or exudate.  Tonsils without swelling or erythema.  Neck: Supple.  No masses, nodules or thyromegaly. Respiratory: Effort is regular with non-labored breathing. Breath sounds are equal bilaterally without rales, rhonchi, wheezing or stridor.  Cardio: RRR with no MRGs. Brisk peripheral pulses without edema.  Abdomen: Active BS in all four quadrants.  Soft and non-tender without guarding, rebound tenderness, hernias or masses. Lymphatics: Non tender without lymphadenopathy.  Musculoskeletal: Full ROM, 5/5 strength, normal ambulation.  No clubbing or cyanosis. Skin: Appropriate color for ethnicity. Warm without rashes, lesions, ecchymosis, ulcers.  Neuro: CN II-XII grossly normal. Normal muscle  tone without cerebellar symptoms and intact sensation.   Psych: AO X 3,  appropriate mood and affect, insight and judgment.     Darrol Jump, NP 10:39 AM [facility]

## 2022-06-03 ENCOUNTER — Ambulatory Visit: Payer: Self-pay

## 2022-06-03 ENCOUNTER — Encounter (HOSPITAL_COMMUNITY): Payer: Self-pay | Admitting: Emergency Medicine

## 2022-06-03 ENCOUNTER — Ambulatory Visit: Payer: Medicare HMO | Admitting: Nurse Practitioner

## 2022-06-03 ENCOUNTER — Other Ambulatory Visit: Payer: Self-pay

## 2022-06-03 ENCOUNTER — Ambulatory Visit (HOSPITAL_COMMUNITY)
Admission: EM | Admit: 2022-06-03 | Discharge: 2022-06-03 | Disposition: A | Discharge status: Home / Self Care | Payer: Medicare HMO | Attending: Nurse Practitioner | Admitting: Nurse Practitioner

## 2022-06-03 DIAGNOSIS — M436 Torticollis: Secondary | ICD-10-CM | POA: Diagnosis not present

## 2022-06-03 MED ORDER — METHOCARBAMOL 500 MG PO TABS: 500.0000 mg | ORAL_TABLET | Freq: Three times a day (TID) | ORAL | 0 refills | Status: DC | PRN

## 2022-06-03 MED ORDER — KETOROLAC TROMETHAMINE 60 MG/2ML IM SOLN
60.0000 mg | Freq: Once | INTRAMUSCULAR | Status: AC
  Administered 2022-06-03: 60 mg via INTRAMUSCULAR

## 2022-06-03 MED ORDER — OXYCODONE HCL 5 MG PO TABS: 5.0000 mg | ORAL_TABLET | ORAL | 0 refills | Status: DC | PRN

## 2022-06-03 MED ORDER — DEXAMETHASONE SODIUM PHOSPHATE 10 MG/ML IJ SOLN
INTRAMUSCULAR | Status: AC
  Filled 2022-06-03: qty 1

## 2022-06-03 MED ORDER — KETOROLAC TROMETHAMINE 60 MG/2ML IM SOLN
INTRAMUSCULAR | Status: AC
  Filled 2022-06-03: qty 2

## 2022-06-03 MED ORDER — DEXAMETHASONE SODIUM PHOSPHATE 10 MG/ML IJ SOLN
10.0000 mg | Freq: Once | INTRAMUSCULAR | Status: AC
  Administered 2022-06-03: 10 mg via INTRAMUSCULAR

## 2022-06-03 NOTE — Progress Notes (Deleted)
Assessment and Plan:  There are no diagnoses linked to this encounter.    Further disposition pending results of labs. Discussed med's effects and SE's.   Over 30 minutes of exam, counseling, chart review, and critical decision making was performed.   Future Appointments  Date Time Provider [place]  06/03/2022  3:30 PM Alycia Rossetti, NP [facility] None  06/04/2022  9:15 AM Trula Slade, DPM [facility]  06/14/2022 12:00 PM [facility] None  06/14/2022  1:45 PM Spero Geralds, MD [facility] None  07/01/2022  9:45 AM Erin Fulling Cheryle Horsfall, MD [facility] None  08/02/2022 10:30 AM Unk Pinto, MD [facility] None  01/22/2023 10:00 AM Unk Pinto, MD [facility] None  04/24/2023 11:00 AM Darrol Jump, NP [facility] None    ------------------------------------------------------------------------------------------------------------------   HPI LMP 11/26/2011  66 y.o.female presents for  Past Medical History:  Diagnosis Date   Anxiety    Arthritis    Asthma    Cataract    Gout    Hyperlipidemia    Hypertension    Obese      Allergies  Allergen Reactions   Molds & Smuts Anaphylaxis   Biaxin [Clarithromycin]     GI upset   Serevent [Salmeterol]     Palpitations   Tequin [Gatifloxacin]     GI upset. thrush    Current Outpatient Medications on File Prior to Visit  Medication Sig   albuterol (PROVENTIL) (2.5 MG/3ML) 0.083% nebulizer solution Take 3 mLs (2.5 mg total) by nebulization every 6 (six) hours as needed for wheezing or shortness of breath.   albuterol (VENTOLIN HFA) 108 (90 Base) MCG/ACT inhaler Inhale 2 puffs into the lungs every 4 (four) hours as needed for wheezing or shortness of breath.   apixaban (ELIQUIS) 5 MG TABS tablet Take 2 tablets twice a day until 7/15 then take 1 tablet twice a day   budesonide (PULMICORT) 0.25 MG/2ML nebulizer solution Take 2 mLs (0.25 mg total) by nebulization in the morning  and at bedtime.   buPROPion (WELLBUTRIN XL) 300 MG 24 hr tablet Take 300 mg by mouth every morning.   celecoxib (CELEBREX) 100 MG capsule TAKE 1 CAPSULE(100 MG) BY MOUTH TWICE DAILY   diphenhydrAMINE (BENADRYL) 25 MG tablet Take 1 tablet (25 mg total) by mouth at bedtime as needed for up to 12 doses for sleep (Headache). Take with reglan as needed at bedtime to help with headache and sleep   EPINEPHrine 0.3 mg/0.3 mL IJ SOAJ injection See admin instructions.   famotidine (PEPCID) 40 MG tablet Take  1 tablet  at Night to Prevent Heartburn & Acid Reflux (Patient taking differently: Take 40 mg by mouth at bedtime. Take  1 tablet  at Night to Prevent Heartburn & Acid Reflux)   fexofenadine (ALLEGRA) 180 MG tablet Take 180 mg by mouth daily.   FLUoxetine (PROZAC) 20 MG capsule Take 1 capsule (20 mg total) by mouth daily. For depression   fluticasone (FLONASE) 50 MCG/ACT nasal spray Place 2 sprays into both nostrils daily.   gabapentin (NEURONTIN) 300 MG capsule Take 1 capsule  3 x /day  for Shingles Neuritis   levocetirizine (XYZAL) 5 MG tablet Take 5 mg by mouth every evening.   lisinopril (ZESTRIL) 20 MG tablet TAKE 1 TABLET BY MOUTH DAILY FOR BLOOD PRESSURE   methylphenidate (RITALIN) 10 MG tablet Take 10 mg by mouth 2 (two) times daily.   metoCLOPramide (REGLAN) 10 MG tablet Take 1 tablet (10 mg total) by mouth every 12 (  twelve) hours as needed for up to 12 doses for nausea.   montelukast (SINGULAIR) 10 MG tablet Take 10 mg by mouth daily.   pantoprazole (PROTONIX) 40 MG tablet Take  1 tablet  every Morning to Prevent Heartburn & Acid Reflux   rosuvastatin (CRESTOR) 40 MG tablet TAKE 1 TABLET BY MOUTH DAILY FOR CHOLESTEROL (Patient taking differently: Take 40 mg by mouth daily.)   SYMBICORT 160-4.5 MCG/ACT inhaler Inhale 2 puffs into the lungs 2 (two) times daily.   VITAMIN D PO Take 15,000 Units by mouth daily.   No current facility-administered medications on file prior to visit.    ROS: all  negative except above.   Physical Exam:  LMP 11/26/2011   General Appearance: Well nourished, in no apparent distress. Eyes: PERRLA, EOMs, conjunctiva no swelling or erythema Sinuses: No Frontal/maxillary tenderness ENT/Mouth: Ext aud canals clear, TMs without erythema, bulging. No erythema, swelling, or exudate on post pharynx.  Tonsils not swollen or erythematous. Hearing normal.  Neck: Supple, thyroid normal.  Respiratory: Respiratory effort normal, BS equal bilaterally without rales, rhonchi, wheezing or stridor.  Cardio: RRR with no MRGs. Brisk peripheral pulses without edema.  Abdomen: Soft, + BS.  Non tender, no guarding, rebound, hernias, masses. Lymphatics: Non tender without lymphadenopathy.  Musculoskeletal: Full ROM, 5/5 strength, normal gait.  Skin: Warm, dry without rashes, lesions, ecchymosis.  Neuro: Cranial nerves intact. Normal muscle tone, no cerebellar symptoms. Sensation intact.  Psych: Awake and oriented X 3, normal affect, Insight and Judgment appropriate.     Alycia Rossetti, NP 8:43 AM [facility]

## 2022-06-03 NOTE — Discharge Instructions (Addendum)
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 This condition is treated supportively with medications, heat, rest and neck exercises   Do stretching exercises and massage (see attached) Take medications as prescribed  You may also take tylenol (acetaminophen) 1000 mg (which is 2 - 500 mg tablets) every 6 hours in addition to the prescribed pain meds. Do not take more than 4000 mg of acetaminophen in a 24-hour period.  Do not take any NSAIDs (e.g. motrin, advil, naproxen, aleve) because you are on the blood thinner, eliquis.  Apply moist heat pack or a heating pad to the affected area at least three times a day. Place a towel between your skin and the heat source. Leave the heat on for 20-30 minutes. Make sure your bed and sleeping area are comfortable.

## 2022-06-03 NOTE — ED Provider Notes (Signed)
[place]    CSN: [id number] Arrival date & time: 06/03/22  1010      History   Chief Complaint Chief Complaint  Patient presents with   Torticollis    HPI Haley White is a 66 y.o. female.    Haley White is a 66 y.o. female presents complaining of left sided neck pain without radiation. Onset of symptoms was gradual starting 4 days ago. Patient denies any injury but went to the movies the day prior to symptom onset in which she had to look up at the movie screen for 3 hours. Pain is described as sharp/stabbing. Severity of symptoms now is intense. Symptoms have been worsening especially over the past day or so. Symptoms are aggravated by movement and moving head. No relief with rest, acetaminophen, heat, or gabapentin. Pain is associated with headache and nausea. She denies any weakness or tingling of the upper extremities. No back pain or shoulder pain. No prior neck issues.       Past Medical History:  Diagnosis Date   Anxiety    Arthritis    Asthma    Cataract    Gout    Hyperlipidemia    Hypertension    Obese     Patient Active Problem List   Diagnosis Date Noted   DOE (dyspnea on exertion) 05/10/2022   Acute pulmonary embolism ([place]) 05/10/2022   Allergic rhinitis 03/15/2021   Allergic rhinitis due to animal (cat) (dog) hair and dander 03/15/2021   Allergic rhinitis due to pollen 03/15/2021   Gastro-esophageal reflux disease without esophagitis 03/15/2021   Mild persistent asthma, uncomplicated 31/49/7026   Aortic atherosclerosis ([place]) by Chest CT scan on 02/02/2018  01/10/2021   Asthma    Low back pain 03/17/2019   Lumbosacral spondylosis without myelopathy 03/17/2019   Cubital tunnel syndrome 10/14/2018   Closed fracture of distal end of right radius 07/09/2018   Pseudogout 12/09/2017   Major depressive disorder, recurrent, severe without psychotic features ([place])    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   Suicide attempt by drug ingestion  ([place])    Medication management 08/22/2014   Vitamin D deficiency 08/22/2014   Hyperlipidemia, mixed 08/22/2014   Hypertension    Anxiety    Abnormal glucose    Class 2 severe obesity due to excess calories with serious comorbidity and body mass index (BMI) of 37.0 to 37.9 in adult [facility])     Past Surgical History:  Procedure Laterality Date   APPENDECTOMY  1984   CATARACT EXTRACTION W/ INTRAOCULAR LENS  IMPLANT, BILATERAL  2014   CESAREAN SECTION  1984, 1987, 1994   X3   COLONOSCOPY  11/16/2013   w/Brodie    FOOT SURGERY Left 2002   KNEE SURGERY Left 1998   ROTATOR CUFF REPAIR Right 2007   TUBAL LIGATION  1994    OB History     Gravida  3   Para      Term      Preterm      AB      Living  3      SAB      IAB      Ectopic      Multiple      Live Births  3            Home Medications    Prior to Admission medications   Medication Sig Start Date End Date Taking? Authorizing Provider  methocarbamol (ROBAXIN) 500 MG tablet Take 1 tablet (500  mg total) by mouth every 8 (eight) hours as needed for muscle spasms. 06/03/22  Yes Enrique Sack, FNP  oxyCODONE (ROXICODONE) 5 MG immediate release tablet Take 1 tablet (5 mg total) by mouth every 4 (four) hours as needed for severe pain. 06/03/22  Yes Enrique Sack, FNP  albuterol (PROVENTIL) (2.5 MG/3ML) 0.083% nebulizer solution Take 3 mLs (2.5 mg total) by nebulization every 6 (six) hours as needed for wheezing or shortness of breath. 04/17/22   Spero Geralds, MD  albuterol (VENTOLIN HFA) 108 (90 Base) MCG/ACT inhaler Inhale 2 puffs into the lungs every 4 (four) hours as needed for wheezing or shortness of breath. 07/17/17   Liane Comber, NP  apixaban (ELIQUIS) 5 MG TABS tablet Take 2 tablets twice a day until 7/15 then take 1 tablet twice a day 05/18/22   Dwyane Dee, MD  budesonide (PULMICORT) 0.25 MG/2ML nebulizer solution Take 2 mLs (0.25 mg total) by nebulization in the morning and at bedtime.  04/17/22   Spero Geralds, MD  buPROPion (WELLBUTRIN XL) 300 MG 24 hr tablet Take 300 mg by mouth every morning. 12/17/20   [provider]  celecoxib (CELEBREX) 100 MG capsule TAKE 1 CAPSULE(100 MG) BY MOUTH TWICE DAILY 05/19/22   Trula Slade, DPM  diphenhydrAMINE (BENADRYL) 25 MG tablet Take 1 tablet (25 mg total) by mouth at bedtime as needed for up to 12 doses for sleep (Headache). Take with reglan as needed at bedtime to help with headache and sleep 03/22/22   Wyvonnia Dusky, MD  EPINEPHrine 0.3 mg/0.3 mL IJ SOAJ injection See admin instructions. 02/08/20   [provider]  famotidine (PEPCID) 40 MG tablet Take  1 tablet  at Night to Prevent Heartburn & Acid Reflux Patient taking differently: Take 40 mg by mouth at bedtime. Take  1 tablet  at Night to Prevent Heartburn & Acid Reflux 04/02/22   Unk Pinto, MD  fexofenadine (ALLEGRA) 180 MG tablet Take 180 mg by mouth daily.    [provider]  FLUoxetine (PROZAC) 20 MG capsule Take 1 capsule (20 mg total) by mouth daily. For depression 05/08/15   Lindell Spar I, NP  fluticasone (FLONASE) 50 MCG/ACT nasal spray Place 2 sprays into both nostrils daily. 10/17/17   [provider]  gabapentin (NEURONTIN) 300 MG capsule Take 1 capsule  3 x /day  for Shingles Neuritis 03/30/22   Unk Pinto, MD  levocetirizine (XYZAL) 5 MG tablet Take 5 mg by mouth every evening. 12/07/17   [provider]  lisinopril (ZESTRIL) 20 MG tablet TAKE 1 TABLET BY MOUTH DAILY FOR BLOOD PRESSURE 05/30/22   Alycia Rossetti, NP  methylphenidate (RITALIN) 10 MG tablet Take 10 mg by mouth 2 (two) times daily. 01/16/18   [provider]  metoCLOPramide (REGLAN) 10 MG tablet Take 1 tablet (10 mg total) by mouth every 12 (twelve) hours as needed for up to 12 doses for nausea. 03/22/22   Wyvonnia Dusky, MD  montelukast (SINGULAIR) 10 MG tablet Take 10 mg by mouth daily.    [provider]  pantoprazole  (PROTONIX) 40 MG tablet Take  1 tablet  every Morning to Prevent Heartburn & Acid Reflux 04/02/22   Unk Pinto, MD  rosuvastatin (CRESTOR) 40 MG tablet TAKE 1 TABLET BY MOUTH DAILY FOR CHOLESTEROL Patient taking differently: Take 40 mg by mouth daily. 04/30/22   Liane Comber, NP  SYMBICORT 160-4.5 MCG/ACT inhaler Inhale 2 puffs into the lungs 2 (two) times daily. 01/16/18  [provider]  VITAMIN D PO Take 15,000 Units by mouth daily.    [provider]    Family History Family History  Problem Relation Age of Onset   Dementia Father    Anxiety disorder Sister    Colon cancer Neg Hx    Colon polyps Neg Hx    Esophageal cancer Neg Hx    Stomach cancer Neg Hx    Rectal cancer Neg Hx     Social History Social History   Tobacco Use   Smoking status: Former    Packs/day: 1.00    Years: 7.00    Total pack years: 7.00    Types: Cigarettes    Quit date: 11/04/1977    Years since quitting: 44.6   Smokeless tobacco: Never  Vaping Use   Vaping Use: Never used  Substance Use Topics   Alcohol use: Yes    Alcohol/week: 4.0 standard drinks of alcohol    Types: 4 Glasses of wine per week   Drug use: No    Comment: 06/03/2022-gummies     Allergies   Molds & smuts, Biaxin [clarithromycin], Prednisone, Serevent [salmeterol], and Tequin [gatifloxacin]   Review of Systems Review of Systems  Musculoskeletal:  Positive for neck pain.  All other systems reviewed and are negative.    Physical Exam Triage Vital Signs ED Triage Vitals  Enc Vitals Group     BP 06/03/22 1047 (!) 144/85     Pulse Rate 06/03/22 1047 (!) 101     Resp 06/03/22 1047 (!) 38     Temp 06/03/22 1047 98.7 F (37.1 C)     Temp Source 06/03/22 1047 Oral     SpO2 06/03/22 1047 97 %     Weight --      Height --      Head Circumference --      Peak Flow --      Pain Score 06/03/22 1042 10     Pain Loc --      Pain Edu? --      Excl. in [place]? --    No data found.  Updated Vital  Signs BP (!) 144/85 (BP Location: Right Arm) Comment (BP Location): large cuff  Pulse (!) 101   Temp 98.7 F (37.1 C) (Oral)   Resp (!) 38   LMP 11/26/2011   SpO2 97%   Visual Acuity Right Eye Distance:   Left Eye Distance:   Bilateral Distance:    Right Eye Near:   Left Eye Near:    Bilateral Near:     Physical Exam Vitals reviewed.  Constitutional:      General: She is not in acute distress.    Appearance: Normal appearance. She is not ill-appearing, toxic-appearing or diaphoretic.  HENT:     Head: Normocephalic.  Neck:     Trachea: Trachea normal.     Comments: Decreased ROM due to pain and inability to try. Muscle tightness noted to the left lateral neck region.  Cardiovascular:     Rate and Rhythm: Normal rate.  Pulmonary:     Effort: Pulmonary effort is normal.  Musculoskeletal:     Cervical back: Neck supple. No edema, erythema, signs of trauma, rigidity or crepitus. Pain with movement and muscular tenderness present. No spinous process tenderness.  Lymphadenopathy:     Cervical: No cervical adenopathy.  Skin:    General: Skin is warm and dry.  Neurological:     General: No focal deficit present.  Mental Status: She is alert and oriented to person, place, and time.  Psychiatric:        Mood and Affect: Mood is anxious.      UC Treatments / Results  Labs (all labs ordered are listed, but only abnormal results are displayed) Labs Reviewed - No data to display  EKG   Radiology No results found.  Procedures Procedures (including critical care time)  Medications Ordered in UC Medications  ketorolac (TORADOL) injection 60 mg (has no administration in time range)  dexamethasone (DECADRON) injection 10 mg (has no administration in time range)    Initial Impression / Assessment and Plan / UC Course  I have reviewed the triage vital signs and the nursing notes.  Pertinent labs & imaging results that were available during my care of the patient  were reviewed by me and considered in my medical decision making (see chart for details).     66 yo female presenting with acute left sided neck pain after looking up at the movie screen 4 days ago. Severity of pain is now intense and unrelieved with rest, acetaminophen, heat, or gabapentin. On exam, patient is anxious and breathing fast due to the pain. Patient was able to calm down some with deep breathing.  Respirations normal and patient is in no acute distress. Muscular tightness and tenderness noted to the left side of the neck. Limited ROM due to pain and lack of cooperation. No focal deficits noted on exam. Symptoms likely due to acute torticollis. Toradol and decadron injection given in clinic. Supportive care at home reccommended with oxycodone for severe pain, acetaminophen, Robaxin, heat and neck exercises. Advised against OTC NSAIDs due to Eliquis use. Orthopedic follow-up recommended if no improvement in symptoms.   Today's evaluation has revealed no signs of a dangerous process. Discussed diagnosis with patient and/or guardian. Patient and/or guardian aware of their diagnosis, possible red flag symptoms to watch out for and need for close follow up. Patient and/or guardian understands verbal and written discharge instructions. Patient and/or guardian comfortable with plan and disposition.  Patient and/or guardian has a clear mental status at this time, good insight into illness (after discussion and teaching) and has clear judgment to make decisions regarding their care  Documentation was completed with the aid of voice recognition software. Transcription may contain typographical errors. Final Clinical Impressions(s) / UC Diagnoses   Final diagnoses:  Acute torticollis     Discharge Instructions      Your neck pain is likely due to a condition called torticollis which is a condition in which the muscles of the neck tighten abnormally. This condition is treated supportively with  medications, heat, rest and neck exercises   Do stretching exercises and massage (see attached) Take medications as prescribed  You may also take tylenol (acetaminophen) 1000 mg (which is 2 - 500 mg tablets) every 6 hours in addition to the prescribed pain meds. Do not take more than 4000 mg of acetaminophen in a 24-hour period.  Do not take any NSAIDs (e.g. motrin, advil, naproxen, aleve) because you are on the blood thinner, eliquis.  Apply moist heat pack or a heating pad to the affected area at least three times a day. Place a towel between your skin and the heat source. Leave the heat on for 20-30 minutes. Make sure your bed and sleeping area are comfortable.      ED Prescriptions     Medication Sig Dispense Auth. Provider   oxyCODONE (ROXICODONE) 5 MG immediate  release tablet Take 1 tablet (5 mg total) by mouth every 4 (four) hours as needed for severe pain. 15 tablet Enrique Sack, FNP   methocarbamol (ROBAXIN) 500 MG tablet Take 1 tablet (500 mg total) by mouth every 8 (eight) hours as needed for muscle spasms. 21 tablet Enrique Sack, FNP      I have reviewed the PDMP during this encounter.   Enrique Sack, [place] 06/03/22 1134

## 2022-06-03 NOTE — ED Triage Notes (Addendum)
Left neck pain since Thursday.  Patient woke with pain.  Describes pain as spasms.  Family member points out that prior to this, patient had been at a movie theater looking up for 3 hours.  Denies any history of neck injury, but has had minimal neck pain, nothing likje this.  Patient is rocking back and forth in chair, hyperventilating, yelling with spasms  Patient takes gabapentin, and has used acetaminophen, and biofreeze  Patient on eliquis due to pulmonary embolism 2 weeks ago hospitalized

## 2022-06-04 ENCOUNTER — Ambulatory Visit: Payer: Medicare HMO | Admitting: Podiatry

## 2022-06-04 DIAGNOSIS — M7672 Peroneal tendinitis, left leg: Secondary | ICD-10-CM

## 2022-06-04 NOTE — Progress Notes (Signed)
Subjective: Chief Complaint  Patient presents with   Routine Post Op    8wks for  F/U LT ANKLE REPAIR OF PERNEAL, Doing better, still having some pain, Rate pain 2 out of 65    67 year old female with above complaints.  She states overall she is doing better.  Still some discomfort prior maximal pain is 2/10.  Since I saw her she has had COVID as well as pulmonary embolisms.  She has not been as active but now that she is feeling better she is back on her feet.  No recent injury or change otherwise.  Objective: AAO x3, NAD-wearing sandals DP/PT pulses palpable bilaterally, CRT less than 3 seconds There is mild tenderness palpation just posterior to lateral nails in the course of the peroneal tendon.  Clinically significant pain tachycardia there is trace edema there is no erythema or warmth.  There is no area pinpoint tenderness.  MMT 5/5.   No pain with calf compression, swelling, warmth, erythema  Assessment: Peroneal tendinitis status post repair  Plan: -All treatment options discussed with the patient including all alternatives, risks, complications.  -Overall she is continue to improve.  Continue with home stretching, rehab exercises.  Continue with supportive shoe gear.  Compression if needed.  Discussed that if she is doing a lot of walking or on uneven surfaces to use the ankle brace if needed. -Patient encouraged to call the office with any questions, concerns, change in symptoms.   Trula Slade DPM

## 2022-06-06 DIAGNOSIS — J3081 Allergic rhinitis due to animal (cat) (dog) hair and dander: Secondary | ICD-10-CM | POA: Diagnosis not present

## 2022-06-06 DIAGNOSIS — J301 Allergic rhinitis due to pollen: Secondary | ICD-10-CM | POA: Diagnosis not present

## 2022-06-06 DIAGNOSIS — J3089 Other allergic rhinitis: Secondary | ICD-10-CM | POA: Diagnosis not present

## 2022-06-08 ENCOUNTER — Other Ambulatory Visit: Payer: Self-pay | Admitting: Internal Medicine

## 2022-06-09 ENCOUNTER — Other Ambulatory Visit: Payer: Self-pay | Admitting: Internal Medicine

## 2022-06-09 DIAGNOSIS — F411 Generalized anxiety disorder: Secondary | ICD-10-CM | POA: Diagnosis not present

## 2022-06-09 DIAGNOSIS — Z62820 Parent-biological child conflict: Secondary | ICD-10-CM | POA: Diagnosis not present

## 2022-06-09 DIAGNOSIS — R69 Illness, unspecified: Secondary | ICD-10-CM | POA: Diagnosis not present

## 2022-06-09 DIAGNOSIS — K219 Gastro-esophageal reflux disease without esophagitis: Secondary | ICD-10-CM

## 2022-06-09 DIAGNOSIS — F331 Major depressive disorder, recurrent, moderate: Secondary | ICD-10-CM | POA: Diagnosis not present

## 2022-06-09 MED ORDER — FAMOTIDINE 40 MG PO TABS: ORAL_TABLET | ORAL | 3 refills | Status: DC

## 2022-06-14 ENCOUNTER — Ambulatory Visit (INDEPENDENT_AMBULATORY_CARE_PROVIDER_SITE_OTHER): Payer: Medicare HMO | Admitting: Internal Medicine

## 2022-06-14 ENCOUNTER — Encounter: Payer: Self-pay | Admitting: Internal Medicine

## 2022-06-14 ENCOUNTER — Ambulatory Visit: Payer: Medicare HMO | Admitting: Internal Medicine

## 2022-06-14 VITALS — BP 110/80 | HR 106 | Ht 62.0 in | Wt 221.1 lb

## 2022-06-14 DIAGNOSIS — U071 COVID-19: Secondary | ICD-10-CM

## 2022-06-14 DIAGNOSIS — J301 Allergic rhinitis due to pollen: Secondary | ICD-10-CM | POA: Diagnosis not present

## 2022-06-14 DIAGNOSIS — I2699 Other pulmonary embolism without acute cor pulmonale: Secondary | ICD-10-CM | POA: Diagnosis not present

## 2022-06-14 DIAGNOSIS — J4540 Moderate persistent asthma, uncomplicated: Secondary | ICD-10-CM

## 2022-06-14 DIAGNOSIS — R0602 Shortness of breath: Secondary | ICD-10-CM

## 2022-06-14 LAB — PULMONARY FUNCTION TEST
DL/VA % pred: 102 %
DL/VA: 4.33 ml/min/mmHg/L
DLCO cor % pred: 101 %
DLCO cor: 18.78 ml/min/mmHg
DLCO unc % pred: 91 %
DLCO unc: 17.01 ml/min/mmHg
FEF 25-75 Post: 3.39 L/sec
FEF 25-75 Pre: 3.13 L/sec
FEF2575-%Change-Post: 8 %
FEF2575-%Pred-Post: 168 %
FEF2575-%Pred-Pre: 155 %
FEV1-%Change-Post: 2 %
FEV1-%Pred-Post: 115 %
FEV1-%Pred-Pre: 113 %
FEV1-Post: 2.58 L
FEV1-Pre: 2.53 L
FEV1FVC-%Change-Post: 2 %
FEV1FVC-%Pred-Pre: 109 %
FEV6-%Change-Post: -1 %
FEV6-%Pred-Post: 105 %
FEV6-%Pred-Pre: 107 %
FEV6-Post: 2.97 L
FEV6-Pre: 3 L
FEV6FVC-%Change-Post: 0 %
FEV6FVC-%Pred-Post: 103 %
FEV6FVC-%Pred-Pre: 104 %
FVC-%Change-Post: 0 %
FVC-%Pred-Post: 101 %
FVC-%Pred-Pre: 102 %
FVC-Post: 2.98 L
FVC-Pre: 3 L
Post FEV1/FVC ratio: 87 %
Post FEV6/FVC ratio: 100 %
Pre FEV1/FVC ratio: 84 %
Pre FEV6/FVC Ratio: 100 %
RV % pred: 101 %
RV: 2.02 L
TLC % pred: 106 %
TLC: 5.07 L

## 2022-06-14 NOTE — Patient Instructions (Addendum)
Please schedule follow up scheduled with myself in 7 months.  If my schedule is not open yet, we will contact you with a reminder closer to that time. Please call [phone] if you haven't heard from [place] a month before.   Follow up with Dr. Donneta Romberg as scheduled.  Pulmonary Function Testing normal.  Make sure you are gargling after you take your [facility]. 2 puffs twice a day.  Take the eliquis until January 7th. After that you can stop. That will complete 6 months of therapy.   I recommend a gentle and gradual exercise routine - movement is medicine!  Call me if any issues or concerns sooner than that.

## 2022-06-14 NOTE — Progress Notes (Signed)
Haley White    [id number]    Feb 22, 1956  Primary Care Physician:McKeown, Gwyndolyn Saxon, MD Date of Appointment: 06/14/2022 Established Patient Visit  Chief complaint:   Chief Complaint  Patient presents with   Follow-up    Follow-up     HPI: Haley White is a 66 y.o. woman with moderate persistent asthma and covid 19 infection in June 2023.   Interval Updates: Here for follow up after PFTs. Saw Katie Cobb in the interim and was stopped on her symbicort and switched to [place]. She also had a CTPE study done with that visit given her worsening dyspnea and tachycardia and this was positive so she went to the ED. She was admitted to the hospital and discharged on Eliquis. Since discharge doing well. Breathing continues to improve. She notes that she has been unable to do her regular walking after foot surgery a year ago and then getting sick with covid and PE. She is starting to do water aerobics and has bought a stationary bike pedal set.   Having some hoarse voice. No bleeding problems. Ok tolerating eliquis without SE.   I have reviewed the patient's family social and past medical history and updated as appropriate.   Past Medical History:  Diagnosis Date   Anxiety    Arthritis    Asthma    Cataract    Gout    Hyperlipidemia    Hypertension    Obese     Past Surgical History:  Procedure Laterality Date   APPENDECTOMY  1984   CATARACT EXTRACTION W/ INTRAOCULAR LENS  IMPLANT, BILATERAL  2014   CESAREAN SECTION  1984, 1987, 1994   X3   COLONOSCOPY  11/16/2013   w/Brodie    FOOT SURGERY Left 2002   KNEE SURGERY Left 1998   ROTATOR CUFF REPAIR Right 2007   TUBAL LIGATION  1994    Family History  Problem Relation Age of Onset   Dementia Father    Anxiety disorder Sister    Colon cancer Neg Hx    Colon polyps Neg Hx    Esophageal cancer Neg Hx    Stomach cancer Neg Hx    Rectal cancer Neg Hx     Social History   Occupational History   Not on file   Tobacco Use   Smoking status: Former    Packs/day: 1.00    Years: 7.00    Total pack years: 7.00    Types: Cigarettes    Quit date: 11/04/1977    Years since quitting: 44.6   Smokeless tobacco: Never  Vaping Use   Vaping Use: Never used  Substance and Sexual Activity   Alcohol use: Yes    Alcohol/week: 4.0 standard drinks of alcohol    Types: 4 Glasses of wine per week   Drug use: No    Comment: 06/03/2022-gummies   Sexual activity: Yes    Birth control/protection: Surgical     Physical Exam: Blood pressure 110/80, pulse (!) 106, height '5\' 2"'$  (1.575 m), weight 221 lb 1.6 oz (100.3 kg), last menstrual period 11/26/2011, SpO2 94 %.  Gen:      No acute distress ENT:  no thrush, no nasal polyps, mucus membranes moist Lungs:    No increased respiratory effort, symmetric chest wall excursion, clear to auscultation bilaterally, no wheezes or crackles CV:         Regular rate and rhythm; no murmurs, rubs, or gallops.  No pedal edema  Data Reviewed: Imaging: I have personally reviewed the CT Chest which shows resolved GGO from previous covid infection. Acute right sided PE without right heart strain.   PFTs:     Latest Ref Rng & Units 06/14/2022   11:52 AM  PFT Results  FVC-Pre L 3.00   FVC-Predicted Pre % 102   FVC-Post L 2.98   FVC-Predicted Post % 101   Pre FEV1/FVC % % 84   Post FEV1/FCV % % 87   FEV1-Pre L 2.53   FEV1-Predicted Pre % 113   FEV1-Post L 2.58   DLCO uncorrected ml/min/mmHg 17.01   DLCO UNC% % 91   DLCO corrected ml/min/mmHg 18.78   DLCO COR %Predicted % 101   DLVA Predicted % 102   TLC L 5.07   TLC % Predicted % 106   RV % Predicted % 101    I have personally reviewed the patient's PFTs and they show normal pulmonary function.   Labs: Lab Results  Component Value Date   WBC 6.5 05/11/2022   HGB 10.7 (L) 05/11/2022   HCT 33.0 (L) 05/11/2022   MCV 98.2 05/11/2022   PLT 271 05/11/2022   Lab Results  Component Value Date   NA 144  05/10/2022   K 4.0 05/10/2022   CL 108 05/10/2022   CO2 23 05/10/2022     Immunization status: Immunization History  Administered Date(s) Administered   Influenza, High Dose Seasonal PF 10/17/2017   Influenza,inj,Quad PF,6+ Mos 09/17/2017   Influenza-Unspecified 08/20/2018   PFIZER(Purple Top)SARS-COV-2 Vaccination 01/20/2020, 02/10/2020, 03/27/2020   PPD Test 10/08/2017, 11/29/2019, 01/10/2021   Pneumococcal Conjugate-13 11/05/1995   Pneumococcal Polysaccharide-23 10/08/2017, 06/15/2021   Td 11/29/2019   Tdap 11/04/2008, 03/28/2020    External Records Personally Reviewed: hospital stay  Assessment:  Moderate persistent asthma, well controlled Acute pulmonary embolism, provoked in the setting of Covid 19 infection  Plan/Recommendations: Follow up with Dr. Donneta Romberg as scheduled.  Pulmonary Function Testing normal.  Make sure you are gargling after you take your [facility]. 2 puffs twice a day.  Take the eliquis until January 7th. After that you can stop. That will complete 6 months of therapy.   I recommend a gentle and gradual exercise routine - movement is medicine!     Return to Care: Return in about 7 months (around 01/13/2023).   Lenice Llamas, MD Pulmonary and [place]

## 2022-06-14 NOTE — Progress Notes (Signed)
PFT done today. 

## 2022-06-16 DIAGNOSIS — F331 Major depressive disorder, recurrent, moderate: Secondary | ICD-10-CM | POA: Diagnosis not present

## 2022-06-16 DIAGNOSIS — F411 Generalized anxiety disorder: Secondary | ICD-10-CM | POA: Diagnosis not present

## 2022-06-16 DIAGNOSIS — R69 Illness, unspecified: Secondary | ICD-10-CM | POA: Diagnosis not present

## 2022-06-16 DIAGNOSIS — Z62820 Parent-biological child conflict: Secondary | ICD-10-CM | POA: Diagnosis not present

## 2022-06-21 DIAGNOSIS — J301 Allergic rhinitis due to pollen: Secondary | ICD-10-CM | POA: Diagnosis not present

## 2022-06-21 DIAGNOSIS — K219 Gastro-esophageal reflux disease without esophagitis: Secondary | ICD-10-CM | POA: Diagnosis not present

## 2022-06-21 DIAGNOSIS — Z91141 Patient's other noncompliance with medication regimen due to financial hardship: Secondary | ICD-10-CM | POA: Diagnosis not present

## 2022-06-21 DIAGNOSIS — J4530 Mild persistent asthma, uncomplicated: Secondary | ICD-10-CM | POA: Diagnosis not present

## 2022-06-23 DIAGNOSIS — F331 Major depressive disorder, recurrent, moderate: Secondary | ICD-10-CM | POA: Diagnosis not present

## 2022-06-23 DIAGNOSIS — Z62820 Parent-biological child conflict: Secondary | ICD-10-CM | POA: Diagnosis not present

## 2022-06-23 DIAGNOSIS — R69 Illness, unspecified: Secondary | ICD-10-CM | POA: Diagnosis not present

## 2022-06-23 DIAGNOSIS — F411 Generalized anxiety disorder: Secondary | ICD-10-CM | POA: Diagnosis not present

## 2022-06-26 ENCOUNTER — Other Ambulatory Visit: Payer: Self-pay | Admitting: Internal Medicine

## 2022-06-26 DIAGNOSIS — B028 Zoster with other complications: Secondary | ICD-10-CM

## 2022-06-30 DIAGNOSIS — Z62820 Parent-biological child conflict: Secondary | ICD-10-CM | POA: Diagnosis not present

## 2022-06-30 DIAGNOSIS — R69 Illness, unspecified: Secondary | ICD-10-CM | POA: Diagnosis not present

## 2022-06-30 DIAGNOSIS — F331 Major depressive disorder, recurrent, moderate: Secondary | ICD-10-CM | POA: Diagnosis not present

## 2022-06-30 DIAGNOSIS — F411 Generalized anxiety disorder: Secondary | ICD-10-CM | POA: Diagnosis not present

## 2022-07-01 ENCOUNTER — Ambulatory Visit: Payer: Medicare HMO | Admitting: Pulmonary Disease

## 2022-07-01 ENCOUNTER — Encounter: Payer: Self-pay | Admitting: Podiatrist

## 2022-07-01 ENCOUNTER — Ambulatory Visit: Payer: Medicare HMO | Admitting: Podiatrist

## 2022-07-01 ENCOUNTER — Ambulatory Visit (INDEPENDENT_AMBULATORY_CARE_PROVIDER_SITE_OTHER): Payer: Medicare HMO

## 2022-07-01 DIAGNOSIS — S93512A Sprain of interphalangeal joint of left great toe, initial encounter: Secondary | ICD-10-CM

## 2022-07-01 DIAGNOSIS — M79672 Pain in left foot: Secondary | ICD-10-CM | POA: Diagnosis not present

## 2022-07-01 DIAGNOSIS — M79671 Pain in right foot: Secondary | ICD-10-CM | POA: Diagnosis not present

## 2022-07-01 DIAGNOSIS — S99922A Unspecified injury of left foot, initial encounter: Secondary | ICD-10-CM

## 2022-07-01 NOTE — Progress Notes (Signed)
Chief Complaint  Patient presents with   Foot Pain    On going since yesterday ..patient tripped over her litter box cord.. foot is swollen with some bruising.. painful to bare weight.      HPI: Patient is 66 y.o. female who presents today for pain in her left great toe joint.  She tripped over a litterbox cord and her toes were bent back.  Her foot turned black and blue and she is unable to walk on her foot without pain.  She has tried ice and elevation with minimal relief. She is concerned her Great toe left foot may be broken.   Patient Active Problem List   Diagnosis Date Noted   DOE (dyspnea on exertion) 05/10/2022   Acute pulmonary embolism (HCC) 05/10/2022   Allergic rhinitis 03/15/2021   Allergic rhinitis due to animal (cat) (dog) hair and dander 03/15/2021   Allergic rhinitis due to pollen 03/15/2021   Gastro-esophageal reflux disease without esophagitis 03/15/2021   Mild persistent asthma, uncomplicated 39/76/7341   Aortic atherosclerosis ([place]) by Chest CT scan on 02/02/2018  01/10/2021   Asthma    Low back pain 03/17/2019   Lumbosacral spondylosis without myelopathy 03/17/2019   Cubital tunnel syndrome 10/14/2018   Closed fracture of distal end of right radius 07/09/2018   Pseudogout 12/09/2017   Major depressive disorder, recurrent, severe without psychotic features ([place])    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   Suicide attempt by drug ingestion ([place])    Medication management 08/22/2014   Vitamin D deficiency 08/22/2014   Hyperlipidemia, mixed 08/22/2014   Hypertension    Anxiety    Abnormal glucose    Class 2 severe obesity due to excess calories with serious comorbidity and body mass index (BMI) of 37.0 to 37.9 in adult [facility]     Current Outpatient Medications on File Prior to Visit  Medication Sig Dispense Refill   albuterol (PROVENTIL) (2.5 MG/3ML) 0.083% nebulizer solution Take 3 mLs (2.5 mg total) by nebulization every 6 (six) hours as needed for wheezing  or shortness of breath. 75 mL 6   albuterol (VENTOLIN HFA) 108 (90 Base) MCG/ACT inhaler Inhale 2 puffs into the lungs every 4 (four) hours as needed for wheezing or shortness of breath. 1 Inhaler 0   apixaban (ELIQUIS) 5 MG TABS tablet Take 2 tablets twice a day until 7/15 then take 1 tablet twice a day 60 tablet 4   budesonide (PULMICORT) 0.25 MG/2ML nebulizer solution Take 2 mLs (0.25 mg total) by nebulization in the morning and at bedtime. 60 mL 6   buPROPion (WELLBUTRIN XL) 300 MG 24 hr tablet Take 300 mg by mouth every morning.     celecoxib (CELEBREX) 100 MG capsule TAKE 1 CAPSULE(100 MG) BY MOUTH TWICE DAILY 60 capsule 0   diphenhydrAMINE (BENADRYL) 25 MG tablet Take 1 tablet (25 mg total) by mouth at bedtime as needed for up to 12 doses for sleep (Headache). Take with reglan as needed at bedtime to help with headache and sleep 12 tablet 0   EPINEPHrine 0.3 mg/0.3 mL IJ SOAJ injection See admin instructions.     famotidine (PEPCID) 40 MG tablet Take  1 tablet  at Night  to Prevent Heartburn & Acid Reflux 90 tablet 3   fexofenadine (ALLEGRA) 180 MG tablet Take 180 mg by mouth daily.     FLUoxetine (PROZAC) 20 MG capsule Take 1 capsule (20 mg total) by mouth daily. For depression 5 capsule 0   fluticasone (FLONASE) 50 MCG/ACT nasal spray Place  2 sprays into both nostrils daily.  3   gabapentin (NEURONTIN) 300 MG capsule Take  1 capsule  3 x /day for Chronic  Pain                                     /                               TAKE                                     BY                           MOUTH 270 capsule 3   levocetirizine (XYZAL) 5 MG tablet Take 5 mg by mouth every evening.  3   lisinopril (ZESTRIL) 20 MG tablet TAKE 1 TABLET BY MOUTH DAILY FOR BLOOD PRESSURE 90 tablet 0   methocarbamol (ROBAXIN) 500 MG tablet Take 1 tablet (500 mg total) by mouth every 8 (eight) hours as needed for muscle spasms. 21 tablet 0   methylphenidate (RITALIN) 10 MG tablet Take 10 mg by mouth 2 (two)  times daily.  0   metoCLOPramide (REGLAN) 10 MG tablet Take 1 tablet (10 mg total) by mouth every 12 (twelve) hours as needed for up to 12 doses for nausea. 12 tablet 0   montelukast (SINGULAIR) 10 MG tablet Take 10 mg by mouth daily.     oxyCODONE (ROXICODONE) 5 MG immediate release tablet Take 1 tablet (5 mg total) by mouth every 4 (four) hours as needed for severe pain. 15 tablet 0   pantoprazole (PROTONIX) 40 MG tablet Take 1 tablet  Daily to Prevent Indigestion & Heartburn                                      /                        TAKE                       BY                       MOUTH 90 tablet 3   rosuvastatin (CRESTOR) 40 MG tablet TAKE 1 TABLET BY MOUTH DAILY FOR CHOLESTEROL (Patient taking differently: Take 40 mg by mouth daily.) 90 tablet 1   SYMBICORT 160-4.5 MCG/ACT inhaler Inhale 2 puffs into the lungs 2 (two) times daily.  6   VITAMIN D PO Take 15,000 Units by mouth daily.     No current facility-administered medications on file prior to visit.    Allergies  Allergen Reactions   Molds & Smuts Anaphylaxis   Biaxin [Clarithromycin]     GI upset   Prednisone     Was admitted for self harm when on prednisone before   Serevent [Salmeterol]     Palpitations   Tequin [Gatifloxacin]     GI upset. thrush    Review of Systems No fevers, chills, nausea, muscle aches, no difficulty breathing, no calf pain, no chest pain or shortness of breath.  Physical Exam  GENERAL APPEARANCE: Alert, conversant. Appropriately groomed. No acute distress.   VASCULAR: Pedal pulses palpable 2/4 DP and 2/4 PT bilateral.  Capillary refill time is immediate to all digits,  Proximal to distal cooling it warm to warm.  Digital perfusion adequate.   NEUROLOGIC: sensation is intact to 5.07 monofilament at 5/5 sites bilateral.  Light touch is intact bilateral, vibratory sensation intact bilateral  MUSCULOSKELETAL: swelling and ecchymosis noted to the left great toe at the metatarsal phalangeal  joint.   No gross boney pedal deformities noted. Pain with any type of range of motion of the first mpj is noted.    DERMATOLOGIC: skin is warm, supple, and dry.  Swelling and ecchymosis noted left foot.   Xray 3 views of the left foot are obtained.  Some swelling around the first mpj is noted.  No fracture is noted.  No dislocation noted.  No acute abnormalities seen. Sesamoid aparatus normal.  No fracture of sesamoids seen.   Assessment     ICD-10-CM   1. Left foot pain  M79.672 DG Foot Complete Left    2. Soft tissue injury of left foot, initial encounter  S99.922A     3. Sprain of interphalangeal joint of left great toe, initial encounter  [phone]        Plan  Discussed exam and xray findings.  Discussed that while there is no fracture on xray, judging from the swelling and pain she likely sprained a liagment in the great toe joint.  I recommended boot immobilization for 2 weeks and after 2 weeks if the swelling and pain is starting to improve she may wean into a supportive walking or running shoe.  She will be seen back in 5-6 weeks for a recheck and to ensure the foot is healing.  If any questions or concerns arise, she is to call.

## 2022-07-02 ENCOUNTER — Other Ambulatory Visit: Payer: Self-pay | Admitting: Podiatrist

## 2022-07-02 MED ORDER — OXYCODONE-ACETAMINOPHEN 5-325 MG PO TABS: 1.0000 | ORAL_TABLET | ORAL | 0 refills | Status: DC | PRN

## 2022-07-02 NOTE — Progress Notes (Signed)
Haley White called stating her foot is painful after her recent fall/ sprain.  Requested pain medication.  I agreed to a 1 time prescription for this injury-  oxycodone 5/325 #15 called into [facility] at [facility] where the is for the weekend.

## 2022-07-02 NOTE — Telephone Encounter (Signed)
Please advise 

## 2022-07-03 ENCOUNTER — Other Ambulatory Visit: Payer: Self-pay | Admitting: Podiatrist

## 2022-07-03 DIAGNOSIS — T1490XA Injury, unspecified, initial encounter: Secondary | ICD-10-CM

## 2022-07-07 DIAGNOSIS — R69 Illness, unspecified: Secondary | ICD-10-CM | POA: Diagnosis not present

## 2022-07-07 DIAGNOSIS — Z62820 Parent-biological child conflict: Secondary | ICD-10-CM | POA: Diagnosis not present

## 2022-07-07 DIAGNOSIS — F331 Major depressive disorder, recurrent, moderate: Secondary | ICD-10-CM | POA: Diagnosis not present

## 2022-07-07 DIAGNOSIS — F411 Generalized anxiety disorder: Secondary | ICD-10-CM | POA: Diagnosis not present

## 2022-07-10 ENCOUNTER — Encounter: Payer: Self-pay | Admitting: Podiatry

## 2022-07-12 DIAGNOSIS — J3089 Other allergic rhinitis: Secondary | ICD-10-CM | POA: Diagnosis not present

## 2022-07-12 DIAGNOSIS — J3081 Allergic rhinitis due to animal (cat) (dog) hair and dander: Secondary | ICD-10-CM | POA: Diagnosis not present

## 2022-07-12 DIAGNOSIS — J301 Allergic rhinitis due to pollen: Secondary | ICD-10-CM | POA: Diagnosis not present

## 2022-07-14 DIAGNOSIS — Z62820 Parent-biological child conflict: Secondary | ICD-10-CM | POA: Diagnosis not present

## 2022-07-14 DIAGNOSIS — F411 Generalized anxiety disorder: Secondary | ICD-10-CM | POA: Diagnosis not present

## 2022-07-14 DIAGNOSIS — F331 Major depressive disorder, recurrent, moderate: Secondary | ICD-10-CM | POA: Diagnosis not present

## 2022-07-14 DIAGNOSIS — R69 Illness, unspecified: Secondary | ICD-10-CM | POA: Diagnosis not present

## 2022-07-19 DIAGNOSIS — J301 Allergic rhinitis due to pollen: Secondary | ICD-10-CM | POA: Diagnosis not present

## 2022-07-21 DIAGNOSIS — F411 Generalized anxiety disorder: Secondary | ICD-10-CM | POA: Diagnosis not present

## 2022-07-21 DIAGNOSIS — F331 Major depressive disorder, recurrent, moderate: Secondary | ICD-10-CM | POA: Diagnosis not present

## 2022-07-21 DIAGNOSIS — Z62820 Parent-biological child conflict: Secondary | ICD-10-CM | POA: Diagnosis not present

## 2022-07-21 DIAGNOSIS — R69 Illness, unspecified: Secondary | ICD-10-CM | POA: Diagnosis not present

## 2022-07-28 DIAGNOSIS — F411 Generalized anxiety disorder: Secondary | ICD-10-CM | POA: Diagnosis not present

## 2022-07-28 DIAGNOSIS — F331 Major depressive disorder, recurrent, moderate: Secondary | ICD-10-CM | POA: Diagnosis not present

## 2022-07-28 DIAGNOSIS — R69 Illness, unspecified: Secondary | ICD-10-CM | POA: Diagnosis not present

## 2022-07-28 DIAGNOSIS — Z62820 Parent-biological child conflict: Secondary | ICD-10-CM | POA: Diagnosis not present

## 2022-08-01 NOTE — Progress Notes (Signed)
Future Appointments  Date Time Provider Department  08/02/2022                            6 mo ov 10:30 AM Unk Pinto, MD [facility]  08/06/2022 11:15 AM Trula Slade, DPM [facility]  01/22/2023                            cpe 10:00 AM Unk Pinto, MD [facility]  04/24/2023                           wellness 11:00 AM Darrol Jump, NP [facility]    History of Present Illness:       This very nice 66 y.o. [name]  presents for 6 month follow up with HTN, HLD, Pre-Diabetes and Vitamin D Deficiency.   Chest CT scan in Apr 2019  showed Aortic Atherosclerosis. Dr Chucky May treats patient for  for ADD & Depression with Fluoxetine /Bupropion & Methylphenidate.        In June 2023, patient had Covid 19  PNA and in July 2023 was discovered to have a Rt sided PE and was hospitalized for Heparin & transitioned to Eliquis ( to continue thru Jan 7 ) .         Patient is treated for HTN  (2008)  & BP has been controlled at home. Today's BP is at goal - 128/78. Patient has had no complaints of any cardiac type chest pain, palpitations, dyspnea Vertell Limber /PND, dizziness, claudication or dependent edema.       Hyperlipidemia is controlled with diet &  Rosuvastatin . Patient denies myalgias or other med SE's. Last Lipids were at goal :  Lab Results  Component Value Date   CHOL 158 01/16/2022   HDL 64 01/16/2022   LDLCALC 78 01/16/2022   TRIG 78 01/16/2022   CHOLHDL 2.5 01/16/2022     Also, the patient has Moderate Obesity  ( BMI 41+ ) & history of  PreDiabetes. Patient has had no symptoms of reactive hypoglycemia, diabetic polys, paresthesias or visual blurring.  Last A1c was near goal :  Lab Results  Component Value Date   HGBA1C 5.9 (H) 01/16/2022                                                          Further, the patient also has history of Vitamin D Deficiency ("20" /2010) and supplements vitamin D. Last vitamin D was sl elevated & dose was decreased :   Lab  Results  Component Value Date   VD25OH 114 (H) 01/16/2022     Current Outpatient Medications on File Prior to Visit  Medication Sig   albuterol (2.5 MG/3ML) 0.083% neb  soln Take 3 mLs by neb every 6  hours as needed    albuterol HFA    inhaler Inhale 2 puffs  every 4 hours as needed    apixaban (ELIQUIS) 5 MG TABS tablet Take 1 tablet twice a day   PULMICORT 0.25 MG/2ML neb soln Take 2 mLs by neb in the morning and at bedtime.   buPROPion' \\XL'$  300 MG Take every morning.   celecoxib 100 MG capsule  TAKE 1 CAPSULE  TWICE DAILY   diphenhydrAMINE 25 MG tablet Take 1 tablet  at bedtime as needed    EPINEPHrine 0.3 mg/0.3 mL inj See admin instructions.   famotidine  40 MG tablet Take  1 tablet  at Night  to Prevent Heartburn    fexofenadine  180 MG tablet Take daily.   FLUoxetine  20 MG capsule Take 1 capsule  daily   FLONASE  nasal spray Place 2 sprays into both nostrils daily.   gabapentin 300 MG capsule Take  1 capsule  3 x /day for Chronic  Pain   levocetirizine  5 MG tablet Take every evening.   lisinopril  20 MG tablet TAKE 1 TABLET  DAILY    methocarbamol 500 MG tablet Take 1 tablet  every 8 hours as needed   methylphenidate  10 MG tablet Take  2  times daily.   metoCLOPramide  10 MG tablet Take 1 tablet every 12  hours as needed    Montelukast  10 MG tablet Take daily.   PERCOCET 5-325 MG tablet Take 1 tablet every 4 hours as needed for severe pain.   pantoprazole 40 MG tablet Take 1 tablet  Daily    rosuvastatin  40 MG tablet TAKE 1 TABLET  DAILY    SYMBICORT 160-4.5  inhaler Inhale 2 puffs 2  times daily.   VITAMIN D PO Take 15,000 Units  daily.      Allergies  Allergen Reactions   Molds & Smuts Anaphylaxis   Biaxin [Clarithromycin]     GI upset   Prednisone     Was admitted for self harm when on prednisone before   Serevent [Salmeterol]     Palpitations   Tequin [Gatifloxacin]     GI upset. thrush     PMHx:   Past Medical History:  Diagnosis Date   Anxiety     Arthritis    Asthma    Cataract    Gout    Hyperlipidemia    Hypertension    Obese      Immunization History  Administered Date(s) Administered   Influenza, High Dose  10/17/2017   Influenza,inj,Quad  09/17/2017   Influenza 08/20/2018   PFIZER-SARS-COV-2 Vacc 01/20/2020, 02/10/2020, 03/27/2020   PPD Test 10/08/2017, 11/29/2019, 01/10/2021   Pneumococcal -13 11/05/1995   Pneumococcal -23 10/08/2017, 06/15/2021   Td 11/29/2019   Tdap 11/04/2008, 03/28/2020     Past Surgical History:  Procedure Laterality Date   APPENDECTOMY  1984   CATARACT EXTRACTION W/ INTRAOCULAR LENS  IMPLANT, BILATERAL  2014   [place]   X3   COLONOSCOPY  11/16/2013   w/[place]    FOOT SURGERY Left 2002   KNEE SURGERY Left 1998   ROTATOR CUFF REPAIR Right 2007   TUBAL LIGATION  1994    FHx:    Reviewed / unchanged  SHx:    Reviewed / unchanged   Systems Review:  Constitutional: Denies fever, chills, wt changes, headaches, insomnia, fatigue, night sweats, change in appetite. Eyes: Denies redness, blurred vision, diplopia, discharge, itchy, watery eyes.  ENT: Denies discharge, congestion, post nasal drip, epistaxis, sore throat, earache, hearing loss, dental pain, tinnitus, vertigo, sinus pain, snoring.  CV: Denies chest pain, palpitations, irregular heartbeat, syncope, dyspnea, diaphoresis, orthopnea, PND, claudication or edema. Respiratory: denies cough, dyspnea, DOE, pleurisy, hoarseness, laryngitis, wheezing.  Gastrointestinal: Denies dysphagia, odynophagia, heartburn, reflux, water brash, abdominal pain or cramps, nausea, vomiting, bloating, diarrhea, constipation, hematemesis, melena, hematochezia  or  hemorrhoids. Genitourinary: Denies dysuria, frequency, urgency, nocturia, hesitancy, discharge, hematuria or flank pain. Musculoskeletal: Denies arthralgias, myalgias, stiffness, jt. swelling, pain, limping or strain/sprain.  Skin: Denies pruritus, rash, hives, warts,  acne, eczema or change in skin lesion(s). Neuro: No weakness, tremor, incoordination, spasms, paresthesia or pain. Psychiatric: Denies confusion, memory loss or sensory loss. Endo: Denies change in weight, skin or hair change.  Heme/Lymph: No excessive bleeding, bruising or enlarged lymph nodes.  Physical Exam  BP 128/78   Pulse 100   Temp 97.9 F (36.6 C)   Resp 17   Ht '5\' 2"'$  (1.575 m)   Wt 224 lb 3.2 oz (101.7 kg)   LMP 11/26/2011   SpO2 95%   BMI 41.01 kg/m   Appears  over nourished   and in no distress.  Eyes: PERRLA, EOMs, conjunctiva no swelling or erythema. Sinuses: No frontal/maxillary tenderness ENT/Mouth: EAC's clear, TM's nl w/o erythema, bulging. Nares clear w/o erythema, swelling, exudates. Oropharynx clear without erythema or exudates. Oral hygiene is good. Tongue normal, non obstructing. Hearing intact.  Neck: Supple. Thyroid not palpable. Car 2+/2+ without bruits, nodes or JVD. Chest: Respirations nl with BS clear & equal w/o rales, rhonchi, wheezing or stridor.  Cor: Heart sounds normal w/ regular rate and rhythm without sig. murmurs, gallops, clicks or rubs. Peripheral pulses normal and equal  without edema.  Abdomen: Soft & bowel sounds normal. Non-tender w/o guarding, rebound, hernias, masses or organomegaly.  Lymphatics: Unremarkable.  Musculoskeletal: Full ROM all peripheral extremities, joint stability, 5/5 strength and normal gait.  Skin: Warm, dry without exposed rashes, lesions or ecchymosis apparent.  Neuro: Cranial nerves intact, reflexes equal bilaterally. Sensory-motor testing grossly intact. Tendon reflexes grossly intact.  Pysch: Alert & oriented x 3.  Insight and judgement nl & appropriate. No ideations.  Assessment and Plan:  1. Essential hypertension  - Continue medication, monitor blood pressure at home.  - Continue DASH diet.  Reminder to go to the ER if any CP,  SOB, nausea, dizziness, severe HA, changes vision/speech.   - CBC with  Differential/Platelet - COMPLETE METABOLIC PANEL WITH GFR - Magnesium - TSH  2. Hyperlipidemia, mixed  - Continue diet/meds, exercise,& lifestyle modifications.  - Continue monitor periodic cholesterol/liver & renal functions   - Lipid panel - TSH  3. Abnormal glucose  - Continue diet, exercise  - Lifestyle modifications.  - Monitor appropriate labs   - Hemoglobin A1c - Insulin, random  4. Vitamin D deficiency  - Continue supplementation   - VITAMIN D 25 Hydroxy   5. Aortic atherosclerosis ([place]) by Chest CT scan on 02/02/2018   - Lipid panel  6. Medication management  - CBC with Differential/Platelet - COMPLETE METABOLIC PANEL WITH GFR - Magnesium - Lipid panel - TSH - Hemoglobin A1c - Insulin, random - VITAMIN D 25 Hydroxy           Discussed  regular exercise, BP monitoring, weight control to achieve/maintain BMI less than 25 and discussed med and SE's. Recommended labs to assess /monitor clinical status.  I discussed the assessment and treatment plan with the patient. The patient was provided an opportunity to ask questions and all were answered. The patient agreed with the plan and demonstrated an understanding of the instructions.  I provided over 30 minutes of exam, counseling, chart review and  complex critical decision making.        The patient was advised to call back or seek an in-person evaluation if the symptoms worsen or if the condition  fails to improve as anticipated.   Kirtland Bouchard, MD

## 2022-08-02 ENCOUNTER — Encounter: Payer: Self-pay | Admitting: Internal Medicine

## 2022-08-02 ENCOUNTER — Ambulatory Visit (INDEPENDENT_AMBULATORY_CARE_PROVIDER_SITE_OTHER): Payer: Medicare HMO | Admitting: Internal Medicine

## 2022-08-02 VITALS — BP 128/78 | HR 100 | Temp 97.9°F | Resp 17 | Ht 62.0 in | Wt 224.2 lb

## 2022-08-02 DIAGNOSIS — E782 Mixed hyperlipidemia: Secondary | ICD-10-CM

## 2022-08-02 DIAGNOSIS — I700 Atherosclerosis of aorta: Secondary | ICD-10-CM

## 2022-08-02 DIAGNOSIS — Z79899 Other long term (current) drug therapy: Secondary | ICD-10-CM

## 2022-08-02 DIAGNOSIS — E559 Vitamin D deficiency, unspecified: Secondary | ICD-10-CM | POA: Diagnosis not present

## 2022-08-02 DIAGNOSIS — I10 Essential (primary) hypertension: Secondary | ICD-10-CM | POA: Diagnosis not present

## 2022-08-02 DIAGNOSIS — R7309 Other abnormal glucose: Secondary | ICD-10-CM

## 2022-08-02 NOTE — Patient Instructions (Signed)

## 2022-08-03 NOTE — Progress Notes (Signed)
<><><><><><><><><><><><><><><><><><><><><><><><><><><><><><><><><> <><><><><><><><><><><><><><><><><><><><><><><><><><><><><><><><><> -   Test results slightly outside the reference range are not unusual. If there is anything important, I will review this with you,  otherwise it is considered normal test values.  If you have further questions,  please do not hesitate to contact me at the office or via My Chart.  <><><><><><><><><><><><><><><><><><><><><><><><><><><><><><><><><> <><><><><><><><><><><><><><><><><><><><><><><><><><><><><><><><><>  -  Total Chol = 196     and   LDL Chol = 86  -   Both  Excellent   - Very low risk for Heart Attack  / Stroke <><><><><><><><><><><><><><><><><><><><><><><><><><><><><><><><><>  -  CBC - much better - Anemia is resolved !  <><><><><><><><><><><><><><><><><><><><><><><><><><><><><><><><><>  -  A1c - Back to Normal - No Diabetes  <><><><><><><><><><><><><><><><><><><><><><><><><><><><><><><><><>  -  Vitamin D = 79  - Excellent - Pleas keep dose same  <><><><><><><><><><><><><><><><><><><><><><><><><><><><><><><><><>  -  All Else - CBC - Kidneys - Electrolytes - Liver - Magnesium & Thyroid    - all  Normal / OK <><><><><><><><><><><><><><><><><><><><><><><><><><><><><><><><><> <><><><><><><><><><><><><><><><><><><><><><><><><><><><><><><><><>

## 2022-08-05 LAB — CBC WITH DIFFERENTIAL/PLATELET
Absolute Monocytes: 454 cells/uL (ref 200–950)
Basophils Absolute: 50 cells/uL (ref 0–200)
Basophils Relative: 0.9 %
Eosinophils Absolute: 342 cells/uL (ref 15–500)
Eosinophils Relative: 6.1 %
HCT: 41.2 % (ref 35.0–45.0)
Hemoglobin: 13.5 g/dL (ref 11.7–15.5)
Lymphs Abs: 1607 cells/uL (ref 850–3900)
MCH: 31.3 pg (ref 27.0–33.0)
MCHC: 32.8 g/dL (ref 32.0–36.0)
MCV: 95.4 fL (ref 80.0–100.0)
MPV: 10.6 fL (ref 7.5–12.5)
Monocytes Relative: 8.1 %
Neutro Abs: 3147 cells/uL (ref 1500–7800)
Neutrophils Relative %: 56.2 %
Platelets: 286 10*3/uL (ref 140–400)
RBC: 4.32 10*6/uL (ref 3.80–5.10)
RDW: 11.3 % (ref 11.0–15.0)
Total Lymphocyte: 28.7 %
WBC: 5.6 10*3/uL (ref 3.8–10.8)

## 2022-08-05 LAB — INSULIN, RANDOM: Insulin: 17.7 u[IU]/mL

## 2022-08-05 LAB — COMPLETE METABOLIC PANEL WITH GFR
AG Ratio: 2.5 (calc) (ref 1.0–2.5)
ALT: 32 U/L — ABNORMAL HIGH (ref 6–29)
AST: 26 U/L (ref 10–35)
Albumin: 4.5 g/dL (ref 3.6–5.1)
Alkaline phosphatase (APISO): 74 U/L (ref 37–153)
BUN: 14 mg/dL (ref 7–25)
CO2: 29 mmol/L (ref 20–32)
Calcium: 9.6 mg/dL (ref 8.6–10.4)
Chloride: 107 mmol/L (ref 98–110)
Creat: 0.94 mg/dL (ref 0.50–1.05)
Globulin: 1.8 g/dL (calc) — ABNORMAL LOW (ref 1.9–3.7)
Glucose, Bld: 106 mg/dL — ABNORMAL HIGH (ref 65–99)
Potassium: 4.7 mmol/L (ref 3.5–5.3)
Sodium: 142 mmol/L (ref 135–146)
Total Bilirubin: 0.7 mg/dL (ref 0.2–1.2)
Total Protein: 6.3 g/dL (ref 6.1–8.1)
eGFR: 67 mL/min/{1.73_m2} (ref >=60)

## 2022-08-05 LAB — MAGNESIUM: Magnesium: 2 mg/dL (ref 1.5–2.5)

## 2022-08-05 LAB — HEMOGLOBIN A1C
Hgb A1c MFr Bld: 5.6 % of total Hgb (ref <5.7)
Mean Plasma Glucose: 114 mg/dL
eAG (mmol/L): 6.3 mmol/L

## 2022-08-05 LAB — LIPID PANEL
Cholesterol: 196 mg/dL (ref <200)
HDL: 83 mg/dL (ref >=50)
LDL Cholesterol (Calc): 86 mg/dL (calc)
Non-HDL Cholesterol (Calc): 113 mg/dL (calc) (ref <130)
Total CHOL/HDL Ratio: 2.4 (calc) (ref <5.0)
Triglycerides: 172 mg/dL — ABNORMAL HIGH (ref <150)

## 2022-08-05 LAB — TSH: TSH: 0.83 mIU/L (ref 0.40–4.50)

## 2022-08-05 LAB — VITAMIN D 25 HYDROXY (VIT D DEFICIENCY, FRACTURES): Vit D, 25-Hydroxy: 79 ng/mL (ref 30–100)

## 2022-08-06 ENCOUNTER — Ambulatory Visit: Payer: Medicare HMO | Admitting: Podiatry

## 2022-08-06 ENCOUNTER — Ambulatory Visit (INDEPENDENT_AMBULATORY_CARE_PROVIDER_SITE_OTHER): Payer: Medicare HMO

## 2022-08-06 DIAGNOSIS — S93502D Unspecified sprain of left great toe, subsequent encounter: Secondary | ICD-10-CM

## 2022-08-06 DIAGNOSIS — M79672 Pain in left foot: Secondary | ICD-10-CM

## 2022-08-06 DIAGNOSIS — S92415D Nondisplaced fracture of proximal phalanx of left great toe, subsequent encounter for fracture with routine healing: Secondary | ICD-10-CM

## 2022-08-06 NOTE — Progress Notes (Signed)
Subjective: Chief Complaint  Patient presents with   Foot Pain    Patient still is having pain in the bunion on the left foot, rate of pain 4 out of 10, X-Rays done today     66 year old female presents for above concerns.  She states that her foot is feeling better but still having pain and points on the first MPJ.  This occurred after she tripped while wearing sandals.  She is back to wearing a sneaker most of the time.  Objective: AAO x3, NAD DP/PT pulses palpable bilaterally, CRT less than 3 seconds On the left foot there is tenderness along the first MPJ.  There is no significant edema there is no erythema or warmth.  No crepitation restriction with MPJ range of motion.  There is global tenderness around the first MPJ but no specific area pinpoint tenderness.  Mild discomfort in the course the peroneal tendon and upon palpation of the incision she gets more of a nerve pain but she does not experience this nerve pain at other times.  No pain with calf compression, swelling, warmth, erythema  Assessment: 66 year old female with avulsion fracture, sprain left first MPJ  Plan: -All treatment options discussed with the patient including all alternatives, risks, complications.  -X-rays obtained reviewed.  3 views of the left foot were obtained.  Small avulsion fracture noted on the lateral aspect of the first MPJ. -Although her symptoms are improving she still in pain.  Recommend continue with stiffer soled shoe, ice, anti-inflammatories as needed.  Recommend not going barefoot or wearing flexible shoes. -Patient encouraged to call the office with any questions, concerns, change in symptoms.   Trula Slade DPM

## 2022-08-11 DIAGNOSIS — F331 Major depressive disorder, recurrent, moderate: Secondary | ICD-10-CM | POA: Diagnosis not present

## 2022-08-11 DIAGNOSIS — Z62820 Parent-biological child conflict: Secondary | ICD-10-CM | POA: Diagnosis not present

## 2022-08-11 DIAGNOSIS — F411 Generalized anxiety disorder: Secondary | ICD-10-CM | POA: Diagnosis not present

## 2022-08-11 DIAGNOSIS — R69 Illness, unspecified: Secondary | ICD-10-CM | POA: Diagnosis not present

## 2022-08-13 DIAGNOSIS — J3081 Allergic rhinitis due to animal (cat) (dog) hair and dander: Secondary | ICD-10-CM | POA: Diagnosis not present

## 2022-08-13 DIAGNOSIS — J3089 Other allergic rhinitis: Secondary | ICD-10-CM | POA: Diagnosis not present

## 2022-08-13 DIAGNOSIS — J301 Allergic rhinitis due to pollen: Secondary | ICD-10-CM | POA: Diagnosis not present

## 2022-08-14 ENCOUNTER — Encounter: Payer: Self-pay | Admitting: Internal Medicine

## 2022-08-18 DIAGNOSIS — F411 Generalized anxiety disorder: Secondary | ICD-10-CM | POA: Diagnosis not present

## 2022-08-18 DIAGNOSIS — Z62820 Parent-biological child conflict: Secondary | ICD-10-CM | POA: Diagnosis not present

## 2022-08-18 DIAGNOSIS — F331 Major depressive disorder, recurrent, moderate: Secondary | ICD-10-CM | POA: Diagnosis not present

## 2022-08-18 DIAGNOSIS — R69 Illness, unspecified: Secondary | ICD-10-CM | POA: Diagnosis not present

## 2022-08-23 ENCOUNTER — Other Ambulatory Visit: Payer: Self-pay | Admitting: Podiatry

## 2022-08-23 ENCOUNTER — Other Ambulatory Visit: Payer: Self-pay | Admitting: Nurse Practitioner

## 2022-08-23 DIAGNOSIS — S92415D Nondisplaced fracture of proximal phalanx of left great toe, subsequent encounter for fracture with routine healing: Secondary | ICD-10-CM

## 2022-08-23 DIAGNOSIS — I10 Essential (primary) hypertension: Secondary | ICD-10-CM

## 2022-08-30 DIAGNOSIS — J301 Allergic rhinitis due to pollen: Secondary | ICD-10-CM | POA: Diagnosis not present

## 2022-09-01 DIAGNOSIS — F411 Generalized anxiety disorder: Secondary | ICD-10-CM | POA: Diagnosis not present

## 2022-09-01 DIAGNOSIS — Z62820 Parent-biological child conflict: Secondary | ICD-10-CM | POA: Diagnosis not present

## 2022-09-01 DIAGNOSIS — R69 Illness, unspecified: Secondary | ICD-10-CM | POA: Diagnosis not present

## 2022-09-01 DIAGNOSIS — F331 Major depressive disorder, recurrent, moderate: Secondary | ICD-10-CM | POA: Diagnosis not present

## 2022-09-05 ENCOUNTER — Ambulatory Visit (INDEPENDENT_AMBULATORY_CARE_PROVIDER_SITE_OTHER): Payer: Medicare HMO

## 2022-09-05 ENCOUNTER — Ambulatory Visit: Payer: Medicare HMO | Admitting: Podiatry

## 2022-09-05 DIAGNOSIS — S92415D Nondisplaced fracture of proximal phalanx of left great toe, subsequent encounter for fracture with routine healing: Secondary | ICD-10-CM

## 2022-09-05 DIAGNOSIS — M25872 Other specified joint disorders, left ankle and foot: Secondary | ICD-10-CM

## 2022-09-05 DIAGNOSIS — S92535A Nondisplaced fracture of distal phalanx of left lesser toe(s), initial encounter for closed fracture: Secondary | ICD-10-CM | POA: Diagnosis not present

## 2022-09-05 NOTE — Patient Instructions (Signed)
You can use voltaren gel on the area as needed

## 2022-09-08 DIAGNOSIS — R69 Illness, unspecified: Secondary | ICD-10-CM | POA: Diagnosis not present

## 2022-09-08 DIAGNOSIS — Z62820 Parent-biological child conflict: Secondary | ICD-10-CM | POA: Diagnosis not present

## 2022-09-08 DIAGNOSIS — F331 Major depressive disorder, recurrent, moderate: Secondary | ICD-10-CM | POA: Diagnosis not present

## 2022-09-08 DIAGNOSIS — F411 Generalized anxiety disorder: Secondary | ICD-10-CM | POA: Diagnosis not present

## 2022-09-08 NOTE — Progress Notes (Signed)
Subjective: Chief Complaint  Patient presents with   Foot Injury    Left foot 3rd toe injury started 3 weeks ago, rate of pain 4 out of 10, X-Rays taken    66 year old female presents for above concerns.  She notes previously she hit her left third toe and since then she has been having swelling and pain at the tip of the toe.  No recent treatment.  Objective: AAO x3, NAD DP/PT pulses palpable bilaterally, CRT less than 3 seconds There is palpation of the distal aspect of the left third toe and there is still localized edema and erythema localized along the DIPJ area.  Pain starts around the PIPJ going distally.  No pain in the metatarsals.  There is mild discomfort along the sesamoid complex with localized edema to this area.  No erythema or warmth.  There is no skin breakdown. No pain with calf compression, swelling, warmth, erythema  Assessment: Likely toe fracture left third toe, physical days  Plan: -All treatment options discussed with the patient including all alternatives, risks, complications.  -X-rays obtained reviewed.  Possible small avulsion fracture noted of the distal phalanx of the third digit.  No evidence of acute fracture noted otherwise. -Recommend immobilized in surgical shoe to help with both issues.  Ice, elevation as well as compression.  Discussed Voltaren gel. -Patient encouraged to call the office with any questions, concerns, change in symptoms.   Trula Slade DPM

## 2022-09-09 DIAGNOSIS — M5451 Vertebrogenic low back pain: Secondary | ICD-10-CM | POA: Diagnosis not present

## 2022-09-10 DIAGNOSIS — J301 Allergic rhinitis due to pollen: Secondary | ICD-10-CM | POA: Diagnosis not present

## 2022-09-10 DIAGNOSIS — J3081 Allergic rhinitis due to animal (cat) (dog) hair and dander: Secondary | ICD-10-CM | POA: Diagnosis not present

## 2022-09-10 DIAGNOSIS — J3089 Other allergic rhinitis: Secondary | ICD-10-CM | POA: Diagnosis not present

## 2022-09-12 ENCOUNTER — Encounter: Payer: Self-pay | Admitting: Internal Medicine

## 2022-09-17 ENCOUNTER — Ambulatory Visit: Payer: Medicare HMO | Admitting: Podiatry

## 2022-09-20 DIAGNOSIS — J301 Allergic rhinitis due to pollen: Secondary | ICD-10-CM | POA: Diagnosis not present

## 2022-09-20 DIAGNOSIS — J3089 Other allergic rhinitis: Secondary | ICD-10-CM | POA: Diagnosis not present

## 2022-09-20 DIAGNOSIS — J3081 Allergic rhinitis due to animal (cat) (dog) hair and dander: Secondary | ICD-10-CM | POA: Diagnosis not present

## 2022-09-22 DIAGNOSIS — F411 Generalized anxiety disorder: Secondary | ICD-10-CM | POA: Diagnosis not present

## 2022-09-22 DIAGNOSIS — R69 Illness, unspecified: Secondary | ICD-10-CM | POA: Diagnosis not present

## 2022-09-22 DIAGNOSIS — Z62820 Parent-biological child conflict: Secondary | ICD-10-CM | POA: Diagnosis not present

## 2022-09-22 DIAGNOSIS — F331 Major depressive disorder, recurrent, moderate: Secondary | ICD-10-CM | POA: Diagnosis not present

## 2022-09-30 ENCOUNTER — Ambulatory Visit: Payer: Medicare HMO | Admitting: Podiatry

## 2022-09-30 ENCOUNTER — Ambulatory Visit (INDEPENDENT_AMBULATORY_CARE_PROVIDER_SITE_OTHER): Payer: Medicare HMO

## 2022-09-30 DIAGNOSIS — S92535D Nondisplaced fracture of distal phalanx of left lesser toe(s), subsequent encounter for fracture with routine healing: Secondary | ICD-10-CM | POA: Diagnosis not present

## 2022-09-30 DIAGNOSIS — S92535A Nondisplaced fracture of distal phalanx of left lesser toe(s), initial encounter for closed fracture: Secondary | ICD-10-CM | POA: Diagnosis not present

## 2022-09-30 DIAGNOSIS — J3081 Allergic rhinitis due to animal (cat) (dog) hair and dander: Secondary | ICD-10-CM | POA: Diagnosis not present

## 2022-09-30 DIAGNOSIS — M7672 Peroneal tendinitis, left leg: Secondary | ICD-10-CM

## 2022-09-30 DIAGNOSIS — J3089 Other allergic rhinitis: Secondary | ICD-10-CM | POA: Diagnosis not present

## 2022-09-30 DIAGNOSIS — J301 Allergic rhinitis due to pollen: Secondary | ICD-10-CM | POA: Diagnosis not present

## 2022-09-30 DIAGNOSIS — M7989 Other specified soft tissue disorders: Secondary | ICD-10-CM

## 2022-09-30 NOTE — Progress Notes (Unsigned)
Subjective: Occasional lef tlateral pain it wil "kick in". She is having more back issues that limit mobility.   Denies any systemic complaints such as fevers, chills, nausea, vomiting. No acute changes since last appointment, and no other complaints at this time.   Objective: AAO x3, NAD DP/PT pulses palpable bilaterally, CRT less than 3 seconds Protective sensation intact with Simms Weinstein monofilament, vibratory sensation intact, Achilles tendon reflex intact No areas of pinpoint bony tenderness or pain with vibratory sensation. MMT 5/5, ROM WNL. No edema, erythema, increase in warmth to bilateral lower extremities.  No open lesions or pre-ulcerative lesions.  No pain with calf compression, swelling, warmth, erythema  Assessment:  Plan: -All treatment options discussed with the patient including all alternatives, risks, complications.   -Patient encouraged to call the office with any questions, concerns, change in symptoms.

## 2022-10-02 ENCOUNTER — Telehealth: Payer: Self-pay | Admitting: *Deleted

## 2022-10-02 NOTE — Telephone Encounter (Signed)
Called [facility] giving information per physician the soft tissue is located dorsal midfoot, will notate this on [place] order.

## 2022-10-02 NOTE — Telephone Encounter (Signed)
[facility] is asking for the where the soft tissue is located, is it the left distal phalanx of the lesser toe,need to know the area to focus on when doing the [place]. Notes not complete as of yet.

## 2022-10-04 ENCOUNTER — Other Ambulatory Visit: Payer: Self-pay | Admitting: Podiatry

## 2022-10-04 DIAGNOSIS — M7672 Peroneal tendinitis, left leg: Secondary | ICD-10-CM

## 2022-10-04 DIAGNOSIS — S92535A Nondisplaced fracture of distal phalanx of left lesser toe(s), initial encounter for closed fracture: Secondary | ICD-10-CM

## 2022-10-04 DIAGNOSIS — S92535D Nondisplaced fracture of distal phalanx of left lesser toe(s), subsequent encounter for fracture with routine healing: Secondary | ICD-10-CM

## 2022-10-04 DIAGNOSIS — M7989 Other specified soft tissue disorders: Secondary | ICD-10-CM

## 2022-10-10 DIAGNOSIS — M5451 Vertebrogenic low back pain: Secondary | ICD-10-CM | POA: Diagnosis not present

## 2022-10-10 DIAGNOSIS — M5459 Other low back pain: Secondary | ICD-10-CM | POA: Diagnosis not present

## 2022-10-14 ENCOUNTER — Encounter: Payer: Self-pay | Admitting: Internal Medicine

## 2022-10-14 DIAGNOSIS — J3089 Other allergic rhinitis: Secondary | ICD-10-CM | POA: Diagnosis not present

## 2022-10-14 DIAGNOSIS — J3081 Allergic rhinitis due to animal (cat) (dog) hair and dander: Secondary | ICD-10-CM | POA: Diagnosis not present

## 2022-10-14 DIAGNOSIS — J301 Allergic rhinitis due to pollen: Secondary | ICD-10-CM | POA: Diagnosis not present

## 2022-10-14 NOTE — Progress Notes (Unsigned)
Future Appointments  Date Time Provider Department  10/15/2022  9:30 AM Haley Pinto, MD [facility]  01/22/2023 10:00 AM Haley Pinto, MD [facility]  04/24/2023 11:00 AM Haley Jump, NP [facility]    History of Present Illness:       Patient is a very nice 66 yo div WF  with   HTN, HLD, Prediabetes  and Vitamin D Deficiency and had  hx/o recent Covid PNA~ May 12 and  and in July 2023 was discovered to have a Rt sided PE and was hospitalized for Heparin & transitioned to Eliquis ( to continue 6 months  thru Jan 7 ) .   She presents now with c/o "shaking & forgetfulness " since Covid infection.  Reports poor short term memory recall as well as distant memories. Has not gotten lost driving. No visual issues.   Medications    EPINEPHrine 0.3 mg/0.3 mL IJ SOAJ injection, See admin instructions.   lisinopril (ZESTRIL) 20 MG tablet, TAKE 1 TABLET  DAILY FOR BLOOD PRESSURE   rosuvastatin (CRESTOR) 40 MG tablet, TAKE 1 TABLET  DAILY FOR CHOLESTEROL (Patient taking differently: Take 40 mg by mouth daily.)   albuterol (2.5 MG/3ML) 0.083% neb soln, Take 3 mLs (2.5 mg total) by neb  every 6  hours as needed    albuterol  HFA  inhaler, Inhale 2 puffs  every 4  hours as needed    fexofenadine (ALLEGRA) 180 MG tablet, Take daily.    FLONASE nasal spray, Place 2 sprays into both nostrils daily.   levocetirizine  5 MG tablet, Take 5 mg by mouth every evening.   montelukast 10 MG tablet, Take 10 mg by mouth daily.   SYMBICORT 160-4.5  inhaler, Inhale 2 puffs 2   times daily.   celecoxib  100 MG capsule, TAKE 1 CAPSULE  TWICE DAILY   apixaban (ELIQUIS) 5 MG TABS tablet, take 1 tablet twice a day:    buPROPion XL 300 MG , Take every morning.   famotidine (PEPCID) 40 MG tablet, Take  1 tablet  at Night    FLUoxetine 20 MG capsule, Take 1 capsule  daily   gabapentin  300 MG capsule, Take  1 capsule  3 x /day for Chronic  Pain         methocarbamol  500 MG tablet, Take 1 tablet every 8  (eight) hours as needed for muscle spasms.   methylphenidate 10 MG tablet, Take 1 2 (two) times daily.   metoCLOPramide  10 MG tablet, Take 1 tablet every 12 hours as needed for up to 12 doses for nausea.   pantoprazole ( 40 MG tablet, Take 1 tablet  Daily    VITAMIN D PO, Take 15,000 Units  daily.  Problem list She has Hypertension; Anxiety; Abnormal glucose; Class 2 severe obesity due to excess calories with serious comorbidity and body mass index (BMI) of 37.0 to 37.9 in adult [facility]); Medication management; Vitamin D deficiency; Hyperlipidemia, mixed; Suicide attempt by drug ingestion ([place]); Steroid-induced depression; Major depressive disorder, recurrent, severe without psychotic features ([place]); Pseudogout; Asthma; Closed fracture of distal end of right radius; Cubital tunnel syndrome; Low back pain; Lumbosacral spondylosis without myelopathy; Aortic atherosclerosis ([place]) by Chest CT scan on 02/02/2018 ; Allergic rhinitis; Gastro-esophageal reflux disease without esophagitis; Mild persistent asthma, uncomplicated; DOE (dyspnea on exertion); and Acute pulmonary embolism ([place]) on their problem list.   Observations/Objective:  LMP 11/26/2011   HEENT - WNL. Cr N testing Nl & symmetric. PERRLA  Neck -  supple.  Chest - Clear equal BS. Cor - Nl HS. RRR w/o sig MGR. PP 1(+). No edema. MS- FROM w/o deformities.  Gait Nl. Neuro -  Nl w/o focal abnormalities. Nl F->Nl. (-) Gower's.  DTR's Nl , equal  & symmetric. No apparent weakness in Extremities x 4.    Assessment and Plan:   1. Post-COVID chronic fatigue  - CBC with Differential/Platelet - COMPLETE METABOLIC PANEL WITH GFR - TSH - Sedimentation rate - C-reactive protein  2. 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    - CBC with  Differential/Platelet - COMPLETE METABOLIC PANEL WITH GFR - TSH - Sedimentation rate - C-reactive protein  3. Medication management  - CBC with Differential/Platelet - COMPLETE METABOLIC PANEL WITH GFR - TSH - Sedimentation rate - C-reactive protein   Follow Up Instructions:  Recommend ROV 1 month to reassess.       I discussed the assessment and treatment plan with the patient. The patient was provided an opportunity to ask questions and all were answered. The patient agreed with the plan and demonstrated an understanding of the instructions. Over 25 minutes of counseling,  chart review and critical decision making was performed        The patient was advised to call back or seek an in-person evaluation if the symptoms worsen or if the condition fails to improve as anticipated.    Haley Bouchard, MD

## 2022-10-15 ENCOUNTER — Encounter: Payer: Self-pay | Admitting: Internal Medicine

## 2022-10-15 ENCOUNTER — Ambulatory Visit (INDEPENDENT_AMBULATORY_CARE_PROVIDER_SITE_OTHER): Payer: Medicare HMO | Admitting: Internal Medicine

## 2022-10-15 VITALS — BP 128/80 | HR 91 | Temp 97.9°F | Resp 16 | Ht 62.0 in | Wt 228.8 lb

## 2022-10-15 DIAGNOSIS — R41840 Attention and concentration deficit: Secondary | ICD-10-CM | POA: Diagnosis not present

## 2022-10-15 DIAGNOSIS — G9332 Myalgic encephalomyelitis/chronic fatigue syndrome: Secondary | ICD-10-CM

## 2022-10-15 DIAGNOSIS — U099 Post covid-19 condition, unspecified: Secondary | ICD-10-CM

## 2022-10-15 DIAGNOSIS — Z79899 Other long term (current) drug therapy: Secondary | ICD-10-CM | POA: Diagnosis not present

## 2022-10-16 LAB — CBC WITH DIFFERENTIAL/PLATELET
Absolute Monocytes: 595 cells/uL (ref 200–950)
Basophils Absolute: 49 cells/uL (ref 0–200)
Basophils Relative: 0.7 %
Eosinophils Absolute: 203 cells/uL (ref 15–500)
Eosinophils Relative: 2.9 %
HCT: 38.3 % (ref 35.0–45.0)
Hemoglobin: 13 g/dL (ref 11.7–15.5)
Lymphs Abs: 2520 cells/uL (ref 850–3900)
MCH: 31.6 pg (ref 27.0–33.0)
MCHC: 33.9 g/dL (ref 32.0–36.0)
MCV: 93.2 fL (ref 80.0–100.0)
MPV: 10 fL (ref 7.5–12.5)
Monocytes Relative: 8.5 %
Neutro Abs: 3633 cells/uL (ref 1500–7800)
Neutrophils Relative %: 51.9 %
Platelets: 322 10*3/uL (ref 140–400)
RBC: 4.11 10*6/uL (ref 3.80–5.10)
RDW: 12.8 % (ref 11.0–15.0)
Total Lymphocyte: 36 %
WBC: 7 10*3/uL (ref 3.8–10.8)

## 2022-10-16 LAB — COMPLETE METABOLIC PANEL WITH GFR
AG Ratio: 2.4 (calc) (ref 1.0–2.5)
ALT: 24 U/L (ref 6–29)
AST: 15 U/L (ref 10–35)
Albumin: 4.3 g/dL (ref 3.6–5.1)
Alkaline phosphatase (APISO): 70 U/L (ref 37–153)
BUN: 17 mg/dL (ref 7–25)
CO2: 29 mmol/L (ref 20–32)
Calcium: 9.2 mg/dL (ref 8.6–10.4)
Chloride: 105 mmol/L (ref 98–110)
Creat: 0.82 mg/dL (ref 0.50–1.05)
Globulin: 1.8 g/dL (calc) — ABNORMAL LOW (ref 1.9–3.7)
Glucose, Bld: 98 mg/dL (ref 65–99)
Potassium: 4.8 mmol/L (ref 3.5–5.3)
Sodium: 142 mmol/L (ref 135–146)
Total Bilirubin: 0.7 mg/dL (ref 0.2–1.2)
Total Protein: 6.1 g/dL (ref 6.1–8.1)
eGFR: 79 mL/min/{1.73_m2} (ref >=60)

## 2022-10-16 LAB — C-REACTIVE PROTEIN: CRP: 3.2 mg/L (ref <8.0)

## 2022-10-16 LAB — TSH: TSH: 1.31 mIU/L (ref 0.40–4.50)

## 2022-10-16 LAB — SEDIMENTATION RATE: Sed Rate: 9 mm/h (ref 0–30)

## 2022-10-16 NOTE — Progress Notes (Signed)
<><><><><><><><><><><><><><><><><><><><><><><><><><><><><><><><><> <><><><><><><><><><><><><><><><><><><><><><><><><><><><><><><><><>  -   All tests fortunately returned perfectly Normal !  - - My suspicion is that stopping  the meds we talked about will make a big difference   <><><><><><><><><><><><><><><><><><><><><><><><><><><><><><><><><> <><><><><><><><><><><><><><><><><><><><><><><><><><><><><><><><><>  -

## 2022-10-17 ENCOUNTER — Ambulatory Visit
Admission: RE | Admit: 2022-10-17 | Discharge: 2022-10-17 | Disposition: A | Discharge status: Home / Self Care | Payer: Medicare HMO | Source: Ambulatory Visit | Attending: Podiatry | Admitting: Podiatry

## 2022-10-17 DIAGNOSIS — M7989 Other specified soft tissue disorders: Secondary | ICD-10-CM

## 2022-10-17 DIAGNOSIS — M79672 Pain in left foot: Secondary | ICD-10-CM | POA: Diagnosis not present

## 2022-10-18 ENCOUNTER — Encounter: Payer: Self-pay | Admitting: Podiatry

## 2022-10-20 DIAGNOSIS — R69 Illness, unspecified: Secondary | ICD-10-CM | POA: Diagnosis not present

## 2022-10-20 DIAGNOSIS — F411 Generalized anxiety disorder: Secondary | ICD-10-CM | POA: Diagnosis not present

## 2022-10-20 DIAGNOSIS — F331 Major depressive disorder, recurrent, moderate: Secondary | ICD-10-CM | POA: Diagnosis not present

## 2022-10-20 DIAGNOSIS — Z62820 Parent-biological child conflict: Secondary | ICD-10-CM | POA: Diagnosis not present

## 2022-10-21 DIAGNOSIS — L720 Epidermal cyst: Secondary | ICD-10-CM | POA: Diagnosis not present

## 2022-10-21 DIAGNOSIS — D485 Neoplasm of uncertain behavior of skin: Secondary | ICD-10-CM | POA: Diagnosis not present

## 2022-10-21 DIAGNOSIS — L821 Other seborrheic keratosis: Secondary | ICD-10-CM | POA: Diagnosis not present

## 2022-10-21 DIAGNOSIS — L820 Inflamed seborrheic keratosis: Secondary | ICD-10-CM | POA: Diagnosis not present

## 2022-10-21 DIAGNOSIS — L853 Xerosis cutis: Secondary | ICD-10-CM | POA: Diagnosis not present

## 2022-10-21 DIAGNOSIS — L814 Other melanin hyperpigmentation: Secondary | ICD-10-CM | POA: Diagnosis not present

## 2022-10-21 DIAGNOSIS — D1801 Hemangioma of skin and subcutaneous tissue: Secondary | ICD-10-CM | POA: Diagnosis not present

## 2022-10-21 DIAGNOSIS — D225 Melanocytic nevi of trunk: Secondary | ICD-10-CM | POA: Diagnosis not present

## 2022-10-24 DIAGNOSIS — J301 Allergic rhinitis due to pollen: Secondary | ICD-10-CM | POA: Diagnosis not present

## 2022-10-24 DIAGNOSIS — J3089 Other allergic rhinitis: Secondary | ICD-10-CM | POA: Diagnosis not present

## 2022-10-24 DIAGNOSIS — J3081 Allergic rhinitis due to animal (cat) (dog) hair and dander: Secondary | ICD-10-CM | POA: Diagnosis not present

## 2022-10-31 ENCOUNTER — Other Ambulatory Visit: Payer: Self-pay | Admitting: Internal Medicine

## 2022-11-01 DIAGNOSIS — M5416 Radiculopathy, lumbar region: Secondary | ICD-10-CM | POA: Diagnosis not present

## 2022-11-03 DIAGNOSIS — R69 Illness, unspecified: Secondary | ICD-10-CM | POA: Diagnosis not present

## 2022-11-03 DIAGNOSIS — Z62820 Parent-biological child conflict: Secondary | ICD-10-CM | POA: Diagnosis not present

## 2022-11-03 DIAGNOSIS — F411 Generalized anxiety disorder: Secondary | ICD-10-CM | POA: Diagnosis not present

## 2022-11-03 DIAGNOSIS — F331 Major depressive disorder, recurrent, moderate: Secondary | ICD-10-CM | POA: Diagnosis not present

## 2022-11-17 DIAGNOSIS — R69 Illness, unspecified: Secondary | ICD-10-CM | POA: Diagnosis not present

## 2022-11-17 DIAGNOSIS — F411 Generalized anxiety disorder: Secondary | ICD-10-CM | POA: Diagnosis not present

## 2022-11-17 DIAGNOSIS — Z62820 Parent-biological child conflict: Secondary | ICD-10-CM | POA: Diagnosis not present

## 2022-11-17 DIAGNOSIS — F331 Major depressive disorder, recurrent, moderate: Secondary | ICD-10-CM | POA: Diagnosis not present

## 2022-11-19 DIAGNOSIS — M5451 Vertebrogenic low back pain: Secondary | ICD-10-CM | POA: Diagnosis not present

## 2022-11-20 DIAGNOSIS — J301 Allergic rhinitis due to pollen: Secondary | ICD-10-CM | POA: Diagnosis not present

## 2022-11-20 DIAGNOSIS — J3089 Other allergic rhinitis: Secondary | ICD-10-CM | POA: Diagnosis not present

## 2022-11-20 DIAGNOSIS — J3081 Allergic rhinitis due to animal (cat) (dog) hair and dander: Secondary | ICD-10-CM | POA: Diagnosis not present

## 2022-11-22 ENCOUNTER — Other Ambulatory Visit: Payer: Self-pay | Admitting: Internal Medicine

## 2022-11-22 ENCOUNTER — Other Ambulatory Visit: Payer: Self-pay | Admitting: Nurse Practitioner

## 2022-11-22 DIAGNOSIS — E782 Mixed hyperlipidemia: Secondary | ICD-10-CM

## 2022-11-22 DIAGNOSIS — I10 Essential (primary) hypertension: Secondary | ICD-10-CM

## 2022-11-22 MED ORDER — ROSUVASTATIN CALCIUM 40 MG PO TABS: ORAL_TABLET | ORAL | 3 refills | Status: DC

## 2022-11-26 ENCOUNTER — Encounter: Payer: Self-pay | Admitting: Internal Medicine

## 2022-11-26 NOTE — Progress Notes (Unsigned)
Future Appointments  Date Time Provider Department  11/27/2022 10:30 AM Unk Pinto, MD [facility]  12/20/2022  8:15 AM Vedia Pereyra, PT [facility]  01/22/2023 10:00 AM Unk Pinto, MD [facility]  04/24/2023 11:00 AM Darrol Jump, NP [facility]    History of Present Illness:       Patient is a very nice 67 yo div WF  with   HTN, HLD, Prediabetes  and Vitamin D Deficiency and had  hx/o recent Covid PNA~ May 2023  and  and in July 2023 was discovered to have a Rt sided PE and was hospitalized for Heparin & transitioned to Eliquis ( to continue 6 months  thru Jan 7 ) .    She was seen 5-6 weeks ago for evaluation for c/o  "shaking & forgetfulness " .  She reported poor short  & long term memory recall.            She had comprehensive lab testing including CBC, CMET, TSH,  hsCRP  & ESR - all of which returned Normal .  She was recommended to  stop Fluoxetine/Prozac, Gabapentin  /Neurontin, Methocarbamol /Robaxin, Reglan /Metoclopropamide & Rosuvastatin /Crestor suspecting that her memory issues were at least in large part medication related. She returns now for f/u. Patient reports since stopping above meds that she feels "back to normal".   Medications Current Outpatient Medications  Medication Instructions   albuterol  neb 2.5 mg, Nebulization, Every 6 hours PRN   Albuterol  HFA  inhaler 2 puffs, Inhalation, Every 4 hours PRN   buPROPion  300 mg XL  Every morning   CELEBREX  100 MG capsule TAKE 1 CAPSULE TWICE DAILY   EPINEPHrine 0.3 mg/0.3 mL   injec    famotidine 40 MG tablet Take  1 tablet  at Night    Fexofenadine 180 mg Daily   FLONASE  nasal spray 2 sprays, Each Nare, Daily   levocetirizine 5 mg  Every evening   lisinopril 20 MG tablet Take  1 tablet  Daily   methocarbamol 500 mg Every 8 hours PRN   Methylphenidate 10 mg, 2 times daily   montelukast 10 mg Daily   pantoprazole 40 MG tablet Take 1 tablet  Daily    SYMBICORT 160-4.5  2 Inhalation, 2 times daily    VITAMIN D   15,000 Units Daily     Problem list She has Hypertension; Anxiety; Abnormal glucose; Class 2 severe obesity due to excess calories with serious comorbidity and body mass index (BMI) of 37.0 to 37.9 in adult [facility]); Medication management; Vitamin D deficiency; Hyperlipidemia, mixed; Suicide attempt by drug ingestion ([place]); Steroid-induced depression; Major depressive disorder, recurrent, severe without psychotic features ([place]); Pseudogout; Asthma; Closed fracture of distal end of right radius; Cubital tunnel syndrome; Low back pain; Lumbosacral spondylosis without myelopathy; Aortic atherosclerosis ([place]) by Chest CT scan on 02/02/2018 ; Allergic rhinitis; Gastro-esophageal reflux disease without esophagitis; Mild persistent asthma, uncomplicated; DOE (dyspnea on exertion); and Acute pulmonary embolism ([facility]) on their problem list.   Observations/Objective:  BP 130/80   Pulse 100   Temp 97.9 F (36.6 C)   Resp 16   Ht '5\' 2"'$  (1.575 m)   Wt 223 lb 3.2 oz (101.2 kg)   LMP 11/26/2011   SpO2 96%   BMI 40.82 kg/m   HEENT - WNL. Cr N testing Nl & symmetric. PERRLA  Neck - supple.  Chest - Clear equal BS. Cor - Nl HS. RRR w/o sig MGR. PP 1(+).  No edema. MS- FROM w/o deformities.  Gait Nl. Neuro -  Nl w/o focal abnormalities. Nl F->Nl. (-) Gower's.  DTR's Nl , equal  & symmetric. No apparent weakness in Extremities x 4.    Assessment and Plan:   1. 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       I discussed the assessment and treatment plan with the patient. The patient was provided an opportunity to ask questions and all were answered. The patient agreed with the plan and demonstrated an understanding of the instructions. Over 25 minutes of counseling,  chart review and critical decision making was performed        The patient was advised to call back or seek an in-person  evaluation if the symptoms worsen or if the condition fails to improve as anticipated.    Kirtland Bouchard, MD

## 2022-11-27 ENCOUNTER — Encounter: Payer: Self-pay | Admitting: Internal Medicine

## 2022-11-27 ENCOUNTER — Ambulatory Visit (INDEPENDENT_AMBULATORY_CARE_PROVIDER_SITE_OTHER): Payer: Medicare HMO | Admitting: Internal Medicine

## 2022-11-27 VITALS — BP 130/80 | HR 100 | Temp 97.9°F | Resp 16 | Ht 62.0 in | Wt 223.2 lb

## 2022-11-27 DIAGNOSIS — J3081 Allergic rhinitis due to animal (cat) (dog) hair and dander: Secondary | ICD-10-CM | POA: Diagnosis not present

## 2022-11-27 DIAGNOSIS — R41840 Attention and concentration deficit: Secondary | ICD-10-CM | POA: Diagnosis not present

## 2022-11-27 DIAGNOSIS — U099 Post covid-19 condition, unspecified: Secondary | ICD-10-CM | POA: Diagnosis not present

## 2022-11-27 DIAGNOSIS — J3089 Other allergic rhinitis: Secondary | ICD-10-CM | POA: Diagnosis not present

## 2022-11-27 DIAGNOSIS — J301 Allergic rhinitis due to pollen: Secondary | ICD-10-CM | POA: Diagnosis not present

## 2022-12-10 DIAGNOSIS — M47896 Other spondylosis, lumbar region: Secondary | ICD-10-CM | POA: Diagnosis not present

## 2022-12-13 DIAGNOSIS — J3081 Allergic rhinitis due to animal (cat) (dog) hair and dander: Secondary | ICD-10-CM | POA: Diagnosis not present

## 2022-12-13 DIAGNOSIS — J301 Allergic rhinitis due to pollen: Secondary | ICD-10-CM | POA: Diagnosis not present

## 2022-12-13 DIAGNOSIS — J3089 Other allergic rhinitis: Secondary | ICD-10-CM | POA: Diagnosis not present

## 2022-12-15 DIAGNOSIS — R69 Illness, unspecified: Secondary | ICD-10-CM | POA: Diagnosis not present

## 2022-12-15 DIAGNOSIS — Z62820 Parent-biological child conflict: Secondary | ICD-10-CM | POA: Diagnosis not present

## 2022-12-15 DIAGNOSIS — F331 Major depressive disorder, recurrent, moderate: Secondary | ICD-10-CM | POA: Diagnosis not present

## 2022-12-15 DIAGNOSIS — F411 Generalized anxiety disorder: Secondary | ICD-10-CM | POA: Diagnosis not present

## 2022-12-17 DIAGNOSIS — M47816 Spondylosis without myelopathy or radiculopathy, lumbar region: Secondary | ICD-10-CM | POA: Diagnosis not present

## 2022-12-20 ENCOUNTER — Ambulatory Visit (HOSPITAL_BASED_OUTPATIENT_CLINIC_OR_DEPARTMENT_OTHER): Payer: Medicare HMO | Attending: Specialist | Admitting: Physical Therapy

## 2022-12-20 ENCOUNTER — Encounter (HOSPITAL_BASED_OUTPATIENT_CLINIC_OR_DEPARTMENT_OTHER): Payer: Self-pay | Admitting: Physical Therapy

## 2022-12-20 ENCOUNTER — Other Ambulatory Visit: Payer: Self-pay

## 2022-12-20 DIAGNOSIS — R2689 Other abnormalities of gait and mobility: Secondary | ICD-10-CM

## 2022-12-20 DIAGNOSIS — R293 Abnormal posture: Secondary | ICD-10-CM | POA: Diagnosis not present

## 2022-12-20 DIAGNOSIS — M5459 Other low back pain: Secondary | ICD-10-CM | POA: Diagnosis not present

## 2022-12-20 DIAGNOSIS — M6281 Muscle weakness (generalized): Secondary | ICD-10-CM | POA: Diagnosis not present

## 2022-12-20 DIAGNOSIS — M5451 Vertebrogenic low back pain: Secondary | ICD-10-CM | POA: Insufficient documentation

## 2022-12-20 NOTE — Therapy (Signed)
OUTPATIENT PHYSICAL THERAPY THORACOLUMBAR EVALUATION   Patient Name: Haley White MRN: [id number] DOB:04-04-1956, 67 y.o., female Today's Date: 12/20/2022  END OF SESSION:  PT End of Session - 12/20/22 1231     Visit Number 1    Number of Visits 16    Date for PT Re-Evaluation 02/21/23    Authorization Type aetna medicare    PT Start Time 0820    PT Stop Time 0854    PT Time Calculation (min) 34 min    Activity Tolerance Patient tolerated treatment well;Patient limited by pain    Behavior During Therapy [facility] for tasks assessed/performed             Past Medical History:  Diagnosis Date   Anxiety    Arthritis    Asthma    Cataract    Gout    Hyperlipidemia    Hypertension    Obese    Past Surgical History:  Procedure Laterality Date   APPENDECTOMY  1984   CATARACT EXTRACTION W/ INTRAOCULAR LENS  IMPLANT, BILATERAL  2014   CESAREAN SECTION  1984, 1987, 1994   X3   COLONOSCOPY  11/16/2013   w/[place]    FOOT SURGERY Left 2002   KNEE SURGERY Left 1998   ROTATOR CUFF REPAIR Right 2007   TUBAL LIGATION  1994   Patient Active Problem List   Diagnosis Date Noted   DOE (dyspnea on exertion) 05/10/2022   Acute pulmonary embolism (HCC) 05/10/2022   Allergic rhinitis 03/15/2021   Gastro-esophageal reflux disease without esophagitis 03/15/2021   Mild persistent asthma, uncomplicated [id number]   Aortic atherosclerosis ([place]) by Chest CT scan on 02/02/2018  01/10/2021   Asthma    Low back pain 03/17/2019   Lumbosacral spondylosis without myelopathy 03/17/2019   Cubital tunnel syndrome 10/14/2018   Closed fracture of distal end of right radius 07/09/2018   Pseudogout 12/09/2017   Major depressive disorder, recurrent, severe without psychotic features ([place])    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   Suicide attempt by drug ingestion ([place])    Medication management 08/22/2014   Vitamin D deficiency 08/22/2014   Hyperlipidemia, mixed 08/22/2014   Hypertension     Anxiety    Abnormal glucose    Class 2 severe obesity due to excess calories with serious comorbidity and body mass index (BMI) of 37.0 to 37.9 in adult [facility])     PCP: Unk Pinto, MD   REFERRING PROVIDER: Susa Day, MD   REFERRING DIAG: M54.51 (ICD-10-CM) - Vertebrogenic low back pain   Rationale for Evaluation and Treatment: Rehabilitation  THERAPY DIAG:  Other low back pain  Muscle weakness (generalized)  Other abnormalities of gait and mobility  Abnormal posture  ONSET DATE: >57yr SUBJECTIVE:  SUBJECTIVE STATEMENT: My back has been hurting for a while. Came here for my ankle injury  last year after that my back pain greatly got worse.  The will be doing an ablation after one more shot which has not yet been scheduled. Pain constant, interrupts sleep.  Wear brace which helps decrease pain.  PERTINENT HISTORY:  Patient is a very nice 67 yo div WF  with   HTN, HLD, Prediabetes  and Vitamin D Deficiency and had  hx/o recent Covid PNA~ May 2023  and  and in July 2023 was discovered to have a Rt sided PE and was hospitalized for Heparin & transitioned to Eliquis ( to continue 6 months  thru Jan 7 ) .    She was seen 5-6 weeks ago for evaluation for c/o  "shaking & forgetfulness " .  She reported poor short  & long term memory recall.  (Pt discontinued meds as per MD and reports back to baseline with memory)  PAIN:  Are you having pain? Yes: NPRS scale: current 4-5/10; least 3-4/10; worst 8/10 Pain location: Low lumbar area across hip to hip Pain description: ache, sharp Aggravating factors: standing <4 mins; walking >5 mins Relieving factors: Aspen lumbar brace; tylenol, ms relaxer  PRECAUTIONS: Back and Fall  WEIGHT BEARING RESTRICTIONS: No  FALLS:  Has patient fallen in last 6  months? No  LIVING ENVIRONMENT: Lives with: lives alone Lives in: House/apartment Stairs: Yes: External: 3 steps; none Has following equipment at home: Single point cane  OCCUPATION: retired  PLOF: Independent  PATIENT GOALS: less pain, be able to stand and walk longer, get strength back  NEXT MD VISIT: next 2 weeks  OBJECTIVE:   DIAGNOSTIC FINDINGS:  Pt reports recent MRI at [place] but no results in [place]: foot pain, soft tissue mass of left third toe US IMPRESSION: Focal tenosynovitis of an extensor tendon in the dorsal foot, with adjacent soft tissue swelling.    PATIENT SURVEYS:  FOTO 69 primary measure with goal of 50% on 12th visit  SCREENING FOR RED FLAGS:  Neg  COGNITION: Overall cognitive status: Within functional limits for tasks assessed     SENSATION: WFL Occasional P&N bilateral hips when back pain is increased  MUSCLE LENGTH: Hamstrings: Right 80 deg; Left 85 deg   POSTURE: rounded shoulders, anterior pelvic tilt, and flexed trunk   PALPATION: TTP moderately sensitive through lower lumbar and sacral area    LUMBAR ROM:   AROM eval  Flexion Finger tips to knees  Extension 25%  Right lateral flexion 10%  Left lateral flexion 25%  Right rotation 50%  Left rotation 50%   All movements limited by PAIN  LOWER EXTREMITY ROM:     WFL  LOWER EXTREMITY MMT:        P!=Pain MMT Right eval Left eval  Hip flexion 3+P! 3+P! Tested supine  Hip extension 3+P! 3+P!  Hip abduction 5 5  Hip adduction 5 5  Hip internal rotation    Hip external rotation    Knee flexion 5 5  Knee extension    Ankle dorsiflexion 5 5  Ankle plantarflexion    Ankle inversion    Ankle eversion     (Blank rows = not tested)  LUMBAR SPECIAL TESTS:  Straight leg raise test: Positive, Slump test: Negative, and Thomas test: Negative  FUNCTIONAL TESTS:  30s sit to stand: 2 Timed up and go (TUG): 20.49 Berg Balance Scale:  26/56  GAIT: Distance walked: 400  Assistive device utilized: None Level of assistance:  Indep  Comments: forward hip flex, dragging heels, no arm swing, slowed cadence and shortened step length  TODAY'S TREATMENT:                                                                                                                              Eval Objective testing Therapeutic Exercise: L stretch     Post pelvic tilt Self care:Instruction with trials on sup<>sit to decrease pain with bed mobility   PATIENT EDUCATION:  Education details: Discussed eval findings, rehab rationale and POC and patient is in agreement Person educated: Patient Education method: Customer service manager Education comprehension: verbalized understanding  HOME EXERCISE PROGRAM: Access Code: [place] URL: [email or web address] Date: 12/20/2022 Prepared by: Denton Meek  Exercises - Standing 'L' Stretch at Counter  - 1 x daily - 7 x weekly - 3 sets - 10 reps - Supine Posterior Pelvic Tilt  - 1 x daily - 7 x weekly - 3 sets - 10 reps  ASSESSMENT:  CLINICAL IMPRESSION: Patient is a 67 y.o. F who was seen today for physical therapy evaluation and treatment for LBP.  She is known well to this clinic as has been seen for ankle dysfunction last year.  She presents today wearing an Aspen lumbar brace and not using an assistive device with moderate pain in LB. She has Lumbosacral spondylosis dx a few years ago although no recent diagnostics in chart. With testing she has decreased strength and ROM, limited by pain. She is a high fall risk as per Merrilee Jansky balance test. Gait pattern deficit exists as she guards throughout gait cycle maintaining a forward trunk flex position which she reports decreases her back pain. (Extension increases pain, flex decreases).  She had a positive result from last spinal injection recently with a few hours of complete relief (0/10) pain and will be having an ablation as soon as  insurance will cover.  She is not afraid of water and is a good candidate for skilled physical therapy beginning water based then will transition to land based when appropriate. Focus will be to improve functional mobility, improve amb and return pt to PLOF without risk of falling. She does have access to indoor pool. She VU of plan and is in agreement  OBJECTIVE IMPAIRMENTS: Abnormal gait, decreased activity tolerance, decreased balance, decreased endurance, decreased knowledge of use of DME, decreased mobility, difficulty walking, decreased ROM, decreased strength, obesity, and pain.   ACTIVITY LIMITATIONS: carrying, lifting, bending, sitting, standing, squatting, sleeping, stairs, transfers, bed mobility, and locomotion level  PARTICIPATION LIMITATIONS: cleaning, laundry, shopping, community activity, and yard work  PERSONAL FACTORS: Fitness, Past/current experiences, and 1-2 comorbidities: HTN, ankle dysfunction  are also affecting patient's functional outcome.   REHAB POTENTIAL: Good  CLINICAL DECISION MAKING: Evolving/moderate complexity  EVALUATION COMPLEXITY: Moderate   GOALS: Goals reviewed with patient? Yes  SHORT TERM GOALS: Target date: 01/17/23  Pt will tolerate full aquatic sessions  consistently without increase in pain and with improving function to demonstrate good toleration and effectiveness of intervention.   Baseline: Goal status: INITIAL  2.  Pt will improve on 30S sit to stand test to >/= 7 (1/2 of norm value) to demonstrate improving mobility and strength Baseline: 2 Goal status: INITIAL  3.  Pt will improve on lumbar ROM by 50% in all areas with decreasing pain Baseline: see chart Goal status: INITIAL  4.  Pt will report increased toleration to standing up towards 10 minutes to fix a meal. Baseline: <4 min Goal status: INITIAL  5.  Pt will report an overall decrease in max/worst pain to <6/10 to improve sleep pattern and decrease physiological responses to  pain Baseline: 8/10 Goal status: INITIAL     LONG TERM GOALS: Target date: 02/21/23  Pt to meet stated Foto Goal of 50 Baseline: 37% primary Goal status: INITIAL  2.  Pt will be indep with final HEP's (land and aquatic as appropriate) for continued management of condition  Baseline:  Goal status: INITIAL  3.  Pt will improve on Berg balance test to >/= 40/56 to demonstrate a decrease in fall risk.   Baseline: 26/56 Goal status: INITIAL  4.  Pt will improve on Tug test to </=  12s to demonstrate improvement in lower extremity function, mobility and decreased fall risk.  Baseline: 20.49 Goal status: INITIAL  5.  Pt will improve on strength of bilat hips at least 1 full grade  Baseline: see chart Goal status: INITIAL  6.  Pt will be able to walk through grocery store unlimited by fatigue or pain pushing cart to demonstrate improvement in functional mobility. Baseline: >5 mins Goal status: INITIAL  PLAN:  PT FREQUENCY: 1-2x/week  PT DURATION: other: 9 weeks  PLANNED INTERVENTIONS: Therapeutic exercises, Therapeutic activity, Neuromuscular re-education, Balance training, Gait training, Patient/Family education, Self Care, Joint mobilization, Joint manipulation, Stair training, Orthotic/Fit training, DME instructions, Aquatic Therapy, Dry Needling, Electrical stimulation, Spinal manipulation, Spinal mobilization, Cryotherapy, Moist heat, scar mobilization, Splintting, Taping, Traction, Ultrasound, Ionotophoresis 36m/ml Dexamethasone, Manual therapy, and Re-evaluation.  PLAN FOR NEXT SESSION: Aquatics: stretching and strengthening core, hips, LE. Balance and gait retraining, Posture retraining. Pain reduction.   FDenton Meek PT MPT 12/20/2022, 12:34 PM

## 2022-12-23 ENCOUNTER — Ambulatory Visit (HOSPITAL_BASED_OUTPATIENT_CLINIC_OR_DEPARTMENT_OTHER): Payer: Medicare HMO | Admitting: Physical Therapy

## 2022-12-23 ENCOUNTER — Encounter (HOSPITAL_BASED_OUTPATIENT_CLINIC_OR_DEPARTMENT_OTHER): Payer: Self-pay | Admitting: Physical Therapy

## 2022-12-23 DIAGNOSIS — M5451 Vertebrogenic low back pain: Secondary | ICD-10-CM | POA: Diagnosis not present

## 2022-12-23 DIAGNOSIS — M6281 Muscle weakness (generalized): Secondary | ICD-10-CM | POA: Diagnosis not present

## 2022-12-23 DIAGNOSIS — R2689 Other abnormalities of gait and mobility: Secondary | ICD-10-CM | POA: Diagnosis not present

## 2022-12-23 DIAGNOSIS — R293 Abnormal posture: Secondary | ICD-10-CM | POA: Diagnosis not present

## 2022-12-23 DIAGNOSIS — M5459 Other low back pain: Secondary | ICD-10-CM | POA: Diagnosis not present

## 2022-12-23 DIAGNOSIS — J3089 Other allergic rhinitis: Secondary | ICD-10-CM | POA: Diagnosis not present

## 2022-12-23 DIAGNOSIS — J3081 Allergic rhinitis due to animal (cat) (dog) hair and dander: Secondary | ICD-10-CM | POA: Diagnosis not present

## 2022-12-23 DIAGNOSIS — J301 Allergic rhinitis due to pollen: Secondary | ICD-10-CM | POA: Diagnosis not present

## 2022-12-23 NOTE — Therapy (Signed)
OUTPATIENT PHYSICAL THERAPY THORACOLUMBAR EVALUATION   Patient Name: Haley White MRN: [id number] DOB:[birth year], 67 y.o., female Today's Date: 12/23/2022  END OF SESSION:  PT End of Session - 12/23/22 1114     Visit Number 2    Number of Visits 16    Date for PT Re-Evaluation 02/21/23    Authorization Type aetna medicare    PT Start Time 1031    PT Stop Time 1112    PT Time Calculation (min) 41 min    Activity Tolerance Patient tolerated treatment well;Patient limited by pain    Behavior During Therapy WFL for tasks assessed/performed             Past Medical History:  Diagnosis Date   Anxiety    Arthritis    Asthma    Cataract    Gout    Hyperlipidemia    Hypertension    Obese    Past Surgical History:  Procedure Laterality Date   APPENDECTOMY  1984   CATARACT EXTRACTION W/ INTRAOCULAR LENS  IMPLANT, BILATERAL  2014   CESAREAN SECTION  1984, 1987, 1994   X3   COLONOSCOPY  11/16/2013   w/Brodie    FOOT SURGERY Left 2002   KNEE SURGERY Left 1998   ROTATOR CUFF REPAIR Right 2007   TUBAL LIGATION  1994   Patient Active Problem List   Diagnosis Date Noted   DOE (dyspnea on exertion) 05/10/2022   Acute pulmonary embolism ([place]) 05/10/2022   Allergic rhinitis 03/15/2021   Gastro-esophageal reflux disease without esophagitis 03/15/2021   Mild persistent asthma, uncomplicated [id number]   Aortic atherosclerosis ([place]) by Chest CT scan on 02/02/2018  01/10/2021   Asthma    Low back pain 03/17/2019   Lumbosacral spondylosis without myelopathy 03/17/2019   Cubital tunnel syndrome 10/14/2018   Closed fracture of distal end of right radius 07/09/2018   Pseudogout 12/09/2017   Major depressive disorder, recurrent, severe without psychotic features ([place])    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   Suicide attempt by drug ingestion ([place])    Medication management 08/22/2014   Vitamin D deficiency 08/22/2014   Hyperlipidemia, mixed 08/22/2014   Hypertension     Anxiety    Abnormal glucose    Class 2 severe obesity due to excess calories with serious comorbidity and body mass index (BMI) of 37.0 to 37.9 in adult [facility])     PCP: Unk Pinto, MD   REFERRING PROVIDER: Susa Day, MD   REFERRING DIAG: M54.51 (ICD-10-CM) - Vertebrogenic low back pain   Rationale for Evaluation and Treatment: Rehabilitation  THERAPY DIAG:  Other low back pain  Muscle weakness (generalized)  Other abnormalities of gait and mobility  Abnormal posture  ONSET DATE: >23yr SUBJECTIVE:  SUBJECTIVE STATEMENT: My back has been hurting for a while. Came here for my ankle injury  last year after that my back pain greatly got worse.  The will be doing an ablation after one more shot which has not yet been scheduled. Pain constant, interrupts sleep.  Wear brace which helps decrease pain.  PERTINENT HISTORY:  Patient is a very nice 67 yo div WF  with   HTN, HLD, Prediabetes  and Vitamin D Deficiency and had  hx/o recent Covid PNA~ May 2023  and  and in July 2023 was discovered to have a Rt sided PE and was hospitalized for Heparin & transitioned to Eliquis ( to continue 6 months  thru Jan 7 ) .    She was seen 5-6 weeks ago for evaluation for c/o  "shaking & forgetfulness " .  She reported poor short  & long term memory recall.  (Pt discontinued meds as per MD and reports back to baseline with memory)  PAIN:  Are you having pain? Yes: NPRS scale: current 7/10; least 3-4/10; worst 8/10 Pain location: Low lumbar area across hip to hip Pain description: ache, sharp Aggravating factors: standing <4 mins; walking >5 mins Relieving factors: Aspen lumbar brace; tylenol, ms relaxer  PRECAUTIONS: Back and Fall  WEIGHT BEARING RESTRICTIONS: No  FALLS:  Has patient fallen in last 6  months? No  LIVING ENVIRONMENT: Lives with: lives alone Lives in: House/apartment Stairs: Yes: External: 3 steps; none Has following equipment at home: Single point cane  OCCUPATION: retired  PLOF: Independent  PATIENT GOALS: less pain, be able to stand and walk longer, get strength back  NEXT MD VISIT: next 2 weeks  OBJECTIVE:   DIAGNOSTIC FINDINGS:  Pt reports recent MRI at [place] but no results in [place]: foot pain, soft tissue mass of left third toe US IMPRESSION: Focal tenosynovitis of an extensor tendon in the dorsal foot, with adjacent soft tissue swelling.    PATIENT SURVEYS:  FOTO 50 primary measure with goal of 50% on 12th visit  SCREENING FOR RED FLAGS:  Neg  COGNITION: Overall cognitive status: Within functional limits for tasks assessed     SENSATION: WFL Occasional P&N bilateral hips when back pain is increased  MUSCLE LENGTH: Hamstrings: Right 80 deg; Left 85 deg   POSTURE: rounded shoulders, anterior pelvic tilt, and flexed trunk   PALPATION: TTP moderately sensitive through lower lumbar and sacral area    LUMBAR ROM:   AROM eval  Flexion Finger tips to knees  Extension 25%  Right lateral flexion 10%  Left lateral flexion 25%  Right rotation 50%  Left rotation 50%   All movements limited by PAIN  LOWER EXTREMITY ROM:     WFL  LOWER EXTREMITY MMT:        P!=Pain MMT Right eval Left eval  Hip flexion 3+P! 3+P! Tested supine  Hip extension 3+P! 3+P!  Hip abduction 5 5  Hip adduction 5 5  Hip internal rotation    Hip external rotation    Knee flexion 5 5  Knee extension    Ankle dorsiflexion 5 5  Ankle plantarflexion    Ankle inversion    Ankle eversion     (Blank rows = not tested)  LUMBAR SPECIAL TESTS:  Straight leg raise test: Positive, Slump test: Negative, and Thomas test: Negative  FUNCTIONAL TESTS:  30s sit to stand: 2 Timed up and go (TUG): 20.49 Berg Balance Scale:  26/56  GAIT: Distance walked: 400  Assistive device utilized: None Level of assistance:  Indep  Comments: forward hip flex, dragging heels, no arm swing, slowed cadence and shortened step length  TODAY'S TREATMENT:                                                                                                                              Pt seen for aquatic therapy today.  Treatment took place in water 3.25-4.5 ft in depth at the [place]. Temp of water was 92.  Pt entered/exited the pool via stairs using step to pattern with hand  rail.  Walking forward, back and side stepping unsupported 4 ft *L stretch standing, not tolerated *Seated lumbar stretch x5 20-30 s hold *forward walking for recovery *green noodle triceps press *core engagement with green noodle press to legs *decompression with noodle wrapped anteriorly across chest  - cycling x 4 widths; add/abd 3 x 10 reps. Cues for increased speed for increased resistance as tolerated *1 foam hand buoys sumerged at side for core engagement walking forward, back and side stepping  Pt requires the buoyancy and hydrostatic pressure of water for support, and to offload joints by unweighting joint load by at least 50 % in navel deep water and by at least 75-80% in chest to neck deep water.  Viscosity of the water is needed for resistance of strengthening. Water current perturbations provides challenge to standing balance requiring increased core activation.     PATIENT EDUCATION:  Education details: Discussed eval findings, rehab rationale and POC and patient is in agreement Person educated: Patient Education method: Customer service manager Education comprehension: verbalized understanding  HOME EXERCISE PROGRAM: Access Code: [place] URL: [email or web address] Date: 12/20/2022 Prepared by: Denton Meek  Exercises - Standing 'L' Stretch at Counter  - 1 x daily - 7 x weekly - 3 sets - 10 reps -  Supine Posterior Pelvic Tilt  - 1 x daily - 7 x weekly - 3 sets - 10 reps  ASSESSMENT:  CLINICAL IMPRESSION: Pt reports high pain over weekend without known triggers. Focused on gentle Rom, stretching and strengthening today to improve muscle length and decrease pain sensitivity. She has many episodes of jabbing pain which with modifying movement decreased. She responds very well to decompression in deep end and is able to engage LE in pain free exercises with slight aerobic capacity element.  She is an excellent candidate for aquatic intervention and will benefit from the properties of water to progress towards goals.   Initial Assessment: Patient is a 67 y.o. F who was seen today for physical therapy evaluation and treatment for LBP.  She is known well to this clinic as has been seen for ankle dysfunction last year.  She presents today wearing an Aspen lumbar brace and not using an assistive device with moderate pain in LB. She has Lumbosacral spondylosis dx a few years ago although no recent diagnostics in chart. With testing she has decreased strength and ROM, limited  by pain. She is a high fall risk as per Merrilee Jansky balance test. Gait pattern deficit exists as she guards throughout gait cycle maintaining a forward trunk flex position which she reports decreases her back pain. (Extension increases pain, flex decreases).  She had a positive result from last spinal injection recently with a few hours of complete relief (0/10) pain and will be having an ablation as soon as insurance will cover.  She is not afraid of water and is a good candidate for skilled physical therapy beginning water based then will transition to land based when appropriate. Focus will be to improve functional mobility, improve amb and return pt to PLOF without risk of falling. She does have access to indoor pool. She VU of plan and is in agreement  OBJECTIVE IMPAIRMENTS: Abnormal gait, decreased activity tolerance, decreased balance,  decreased endurance, decreased knowledge of use of DME, decreased mobility, difficulty walking, decreased ROM, decreased strength, obesity, and pain.   ACTIVITY LIMITATIONS: carrying, lifting, bending, sitting, standing, squatting, sleeping, stairs, transfers, bed mobility, and locomotion level  PARTICIPATION LIMITATIONS: cleaning, laundry, shopping, community activity, and yard work  PERSONAL FACTORS: Fitness, Past/current experiences, and 1-2 comorbidities: HTN, ankle dysfunction  are also affecting patient's functional outcome.   REHAB POTENTIAL: Good  CLINICAL DECISION MAKING: Evolving/moderate complexity  EVALUATION COMPLEXITY: Moderate   GOALS: Goals reviewed with patient? Yes  SHORT TERM GOALS: Target date: 01/17/23  Pt will tolerate full aquatic sessions consistently without increase in pain and with improving function to demonstrate good toleration and effectiveness of intervention.   Baseline: Goal status: INITIAL  2.  Pt will improve on 30S sit to stand test to >/= 7 (1/2 of norm value) to demonstrate improving mobility and strength Baseline: 2 Goal status: INITIAL  3.  Pt will improve on lumbar ROM by 50% in all areas with decreasing pain Baseline: see chart Goal status: INITIAL  4.  Pt will report increased toleration to standing up towards 10 minutes to fix a meal. Baseline: <4 min Goal status: INITIAL  5.  Pt will report an overall decrease in max/worst pain to <6/10 to improve sleep pattern and decrease physiological responses to pain Baseline: 8/10 Goal status: INITIAL     LONG TERM GOALS: Target date: 02/21/23  Pt to meet stated Foto Goal of 50 Baseline: 37% primary Goal status: INITIAL  2.  Pt will be indep with final HEP's (land and aquatic as appropriate) for continued management of condition  Baseline:  Goal status: INITIAL  3.  Pt will improve on Berg balance test to >/= 40/56 to demonstrate a decrease in fall risk.   Baseline: 26/56 Goal  status: INITIAL  4.  Pt will improve on Tug test to </=  12s to demonstrate improvement in lower extremity function, mobility and decreased fall risk.  Baseline: 20.49 Goal status: INITIAL  5.  Pt will improve on strength of bilat hips at least 1 full grade  Baseline: see chart Goal status: INITIAL  6.  Pt will be able to walk through grocery store unlimited by fatigue or pain pushing cart to demonstrate improvement in functional mobility. Baseline: >5 mins Goal status: INITIAL  PLAN:  PT FREQUENCY: 1-2x/week  PT DURATION: other: 9 weeks  PLANNED INTERVENTIONS: Therapeutic exercises, Therapeutic activity, Neuromuscular re-education, Balance training, Gait training, Patient/Family education, Self Care, Joint mobilization, Joint manipulation, Stair training, Orthotic/Fit training, DME instructions, Aquatic Therapy, Dry Needling, Electrical stimulation, Spinal manipulation, Spinal mobilization, Cryotherapy, Moist heat, scar mobilization, Splintting, Taping, Traction, Ultrasound, Ionotophoresis  22m/ml Dexamethasone, Manual therapy, and Re-evaluation.  PLAN FOR NEXT SESSION: Aquatics: stretching and strengthening core, hips, LE. Balance and gait retraining, Posture retraining. Pain reduction.   FDenton Meek PT MPT 12/23/2022, 11:14 AM

## 2022-12-25 ENCOUNTER — Encounter (HOSPITAL_BASED_OUTPATIENT_CLINIC_OR_DEPARTMENT_OTHER): Payer: Self-pay | Admitting: Physical Therapy

## 2022-12-25 ENCOUNTER — Ambulatory Visit (HOSPITAL_BASED_OUTPATIENT_CLINIC_OR_DEPARTMENT_OTHER): Payer: Medicare HMO | Admitting: Physical Therapy

## 2022-12-25 DIAGNOSIS — M6281 Muscle weakness (generalized): Secondary | ICD-10-CM

## 2022-12-25 DIAGNOSIS — R2689 Other abnormalities of gait and mobility: Secondary | ICD-10-CM | POA: Diagnosis not present

## 2022-12-25 DIAGNOSIS — R293 Abnormal posture: Secondary | ICD-10-CM | POA: Diagnosis not present

## 2022-12-25 DIAGNOSIS — M5459 Other low back pain: Secondary | ICD-10-CM | POA: Diagnosis not present

## 2022-12-25 DIAGNOSIS — M5451 Vertebrogenic low back pain: Secondary | ICD-10-CM | POA: Diagnosis not present

## 2022-12-25 NOTE — Therapy (Signed)
OUTPATIENT PHYSICAL THERAPY THORACOLUMBAR TREATMENT   Patient Name: Haley White MRN: [id number] DOB:09-Feb-1956, 67 y.o., female Today's Date: 12/25/2022  END OF SESSION:  PT End of Session - 12/25/22 0828     Visit Number 3    Number of Visits 16    Date for PT Re-Evaluation 02/21/23    Authorization Type aetna medicare    PT Start Time 0810    PT Stop Time 0855    PT Time Calculation (min) 45 min    Activity Tolerance Patient tolerated treatment well    Behavior During Therapy WFL for tasks assessed/performed             Past Medical History:  Diagnosis Date   Anxiety    Arthritis    Asthma    Cataract    Gout    Hyperlipidemia    Hypertension    Obese    Past Surgical History:  Procedure Laterality Date   APPENDECTOMY  1984   CATARACT EXTRACTION W/ INTRAOCULAR LENS  IMPLANT, BILATERAL  2014   CESAREAN SECTION  1984, 1987, 1994   X3   COLONOSCOPY  11/16/2013   w/Brodie    FOOT SURGERY Left 2002   KNEE SURGERY Left 1998   ROTATOR CUFF REPAIR Right 2007   TUBAL LIGATION  1994   Patient Active Problem List   Diagnosis Date Noted   DOE (dyspnea on exertion) 05/10/2022   Acute pulmonary embolism ([facility]) 05/10/2022   Allergic rhinitis 03/15/2021   Gastro-esophageal reflux disease without esophagitis 03/15/2021   Mild persistent asthma, uncomplicated [id number]   Aortic atherosclerosis ([place]) by Chest CT scan on 02/02/2018  01/10/2021   Asthma    Low back pain 03/17/2019   Lumbosacral spondylosis without myelopathy 03/17/2019   Cubital tunnel syndrome 10/14/2018   Closed fracture of distal end of right radius 07/09/2018   Pseudogout 12/09/2017   Major depressive disorder, recurrent, severe without psychotic features ([place])    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   Suicide attempt by drug ingestion ([place])    Medication management 08/22/2014   Vitamin D deficiency 08/22/2014   Hyperlipidemia, mixed 08/22/2014   Hypertension    Anxiety    Abnormal glucose     Class 2 severe obesity due to excess calories with serious comorbidity and body mass index (BMI) of 37.0 to 37.9 in adult [facility])     PCP: Unk Pinto, MD   REFERRING PROVIDER: Susa Day, MD   REFERRING DIAG: M54.51 (ICD-10-CM) - Vertebrogenic low back pain   Rationale for Evaluation and Treatment: Rehabilitation  THERAPY DIAG:  Other low back pain  Muscle weakness (generalized)  Other abnormalities of gait and mobility  Abnormal posture  ONSET DATE: >80yr SUBJECTIVE:  SUBJECTIVE STATEMENT: Pt reports she had about 4 bad days following last session.  Today is much better.   PERTINENT HISTORY:  Patient is a very nice 67 yo div WF  with   HTN, HLD, Prediabetes  and Vitamin D Deficiency and had  hx/o recent Covid PNA~ May 2023  and  and in July 2023 was discovered to have a Rt sided PE and was hospitalized for Heparin & transitioned to Eliquis ( to continue 6 months  thru Jan 7 ) .    She was seen 5-6 weeks ago for evaluation for c/o  "shaking & forgetfulness " .  She reported poor short  & long term memory recall.  (Pt discontinued meds as per MD and reports back to baseline with memory)  PAIN:  Are you having pain? Yes: NPRS scale: 3/10 Pain location: Low lumbar area across hip to hip Pain description: ache, dull  Aggravating factors: standing <4 mins; walking >5 mins Relieving factors: Aspen lumbar brace; tylenol, ms relaxer  PRECAUTIONS: Back and Fall  WEIGHT BEARING RESTRICTIONS: No  FALLS:  Has patient fallen in last 6 months? No  LIVING ENVIRONMENT: Lives with: lives alone Lives in: House/apartment Stairs: Yes: External: 3 steps; none Has following equipment at home: Single point cane  OCCUPATION: retired  PLOF: Independent  PATIENT GOALS: less pain, be able to  stand and walk longer, get strength back  NEXT MD VISIT: next 2 weeks  OBJECTIVE:   DIAGNOSTIC FINDINGS:  Pt reports recent MRI at [place] but no results in [place]: foot pain, soft tissue mass of left third toe US IMPRESSION: Focal tenosynovitis of an extensor tendon in the dorsal foot, with adjacent soft tissue swelling.    PATIENT SURVEYS:  FOTO 14 primary measure with goal of 50% on 12th visit  SCREENING FOR RED FLAGS:  Neg  COGNITION: Overall cognitive status: Within functional limits for tasks assessed     SENSATION: WFL Occasional P&N bilateral hips when back pain is increased  MUSCLE LENGTH: Hamstrings: Right 80 deg; Left 85 deg   POSTURE: rounded shoulders, anterior pelvic tilt, and flexed trunk   PALPATION: TTP moderately sensitive through lower lumbar and sacral area    LUMBAR ROM:   AROM eval  Flexion Finger tips to knees  Extension 25%  Right lateral flexion 10%  Left lateral flexion 25%  Right rotation 50%  Left rotation 50%   All movements limited by PAIN  LOWER EXTREMITY ROM:     WFL  LOWER EXTREMITY MMT:        P!=Pain MMT Right eval Left eval  Hip flexion 3+P! 3+P! Tested supine  Hip extension 3+P! 3+P!  Hip abduction 5 5  Hip adduction 5 5  Hip internal rotation    Hip external rotation    Knee flexion 5 5  Knee extension    Ankle dorsiflexion 5 5  Ankle plantarflexion    Ankle inversion    Ankle eversion     (Blank rows = not tested)  LUMBAR SPECIAL TESTS:  Straight leg raise test: Positive, Slump test: Negative, and Thomas test: Negative  FUNCTIONAL TESTS:  30s sit to stand: 2 Timed up and go (TUG): 20.49 Berg Balance Scale: 26/56  GAIT: Distance walked: 400 Assistive device utilized: None Level of assistance:  Indep  Comments: forward hip flex, dragging heels, no arm swing, slowed cadence and shortened step length  TODAY'S TREATMENT:  Pt seen for aquatic therapy today.  Treatment took place in water 3.5-4.5 ft in depth at the [place]. Temp of water was 92.  Pt entered/exited the pool via stairs using step to pattern with hand  rail.  *Walking forward, back and side stepping unsupported 4 ft, cues for vertical trunk * Lt gastroc and soleus stretch at wall  * marching forward/ backward with reciprocal arm swing * holding wall in 4+ feet:  heel raises,  partial squat to/from heel raises  *L stretch standing, with gentle wag the tail x 2 reps  * holding wall: LE circles CW/CCW * return to walking forward / backward, cues for smaller step length, increased speed * TrA set with short noodle pull down to thighs, with various stops in between surface with isometric holds * TrA and lower trap set with rainbow hand float tricep press down, bilat shoulder addct/abdct, then side stepping with shoulder addct * straddling yellow noodle and cycling in deeper water with breast stroke arms   Pt requires the buoyancy and hydrostatic pressure of water for support, and to offload joints by unweighting joint load by at least 50 % in navel deep water and by at least 75-80% in chest to neck deep water.  Viscosity of the water is needed for resistance of strengthening. Water current perturbations provides challenge to standing balance requiring increased core activation.   PATIENT EDUCATION:  Education details: aquatic progressions and modifications  Person educated: Patient Education method: Customer service manager Education comprehension: verbalized understanding  HOME EXERCISE PROGRAM: Access Code: [place] URL: [email or web address] Date: 12/20/2022 Prepared by: Denton Meek  Exercises - Standing 'L' Stretch at Counter  - 1 x daily - 7 x weekly - 3 sets - 10 reps - Supine Posterior Pelvic Tilt  - 1 x daily - 7 x weekly - 3 sets - 10  reps  ASSESSMENT:  CLINICAL IMPRESSION: Pt had positive response to last aquatic session with reported pain decreased in the 2 days following last session.  Educated pt on DOMS expectations after aquatic sessions.  She tolerated exercises well today, noting greatest relief while suspended in deeper water while cycling.  Focus today was on posture and core engagement with movement in the water.  Pt will benefit from the properties of water to progress towards goals. Encouraged pt to stick with walking in water, and cycling as she did today as her starting aquatic HEP; plan to create formal HEP as she progresses in sessions.  Goals are ongoing.     OBJECTIVE IMPAIRMENTS: Abnormal gait, decreased activity tolerance, decreased balance, decreased endurance, decreased knowledge of use of DME, decreased mobility, difficulty walking, decreased ROM, decreased strength, obesity, and pain.   ACTIVITY LIMITATIONS: carrying, lifting, bending, sitting, standing, squatting, sleeping, stairs, transfers, bed mobility, and locomotion level  PARTICIPATION LIMITATIONS: cleaning, laundry, shopping, community activity, and yard work  PERSONAL FACTORS: Fitness, Past/current experiences, and 1-2 comorbidities: HTN, ankle dysfunction  are also affecting patient's functional outcome.   REHAB POTENTIAL: Good  CLINICAL DECISION MAKING: Evolving/moderate complexity  EVALUATION COMPLEXITY: Moderate   GOALS: Goals reviewed with patient? Yes  SHORT TERM GOALS: Target date: 01/17/23  Pt will tolerate full aquatic sessions consistently without increase in pain and with improving function to demonstrate good toleration and effectiveness of intervention.   Baseline: Goal status: INITIAL  2.  Pt will improve on 30S sit to stand test to >/= 7 (1/2 of norm value) to demonstrate improving mobility and strength Baseline: 2 Goal status: INITIAL  3.  Pt will improve on lumbar ROM by 50% in all areas with decreasing  pain Baseline: see chart Goal status: INITIAL  4.  Pt will report increased toleration to standing up towards 10 minutes to fix a meal. Baseline: <4 min Goal status: INITIAL  5.  Pt will report an overall decrease in max/worst pain to <6/10 to improve sleep pattern and decrease physiological responses to pain Baseline: 8/10 Goal status: INITIAL     LONG TERM GOALS: Target date: 02/21/23  Pt to meet stated Foto Goal of 50 Baseline: 37% primary Goal status: INITIAL  2.  Pt will be indep with final HEP's (land and aquatic as appropriate) for continued management of condition  Baseline:  Goal status: INITIAL  3.  Pt will improve on Berg balance test to >/= 40/56 to demonstrate a decrease in fall risk.   Baseline: 26/56 Goal status: INITIAL  4.  Pt will improve on Tug test to </=  12s to demonstrate improvement in lower extremity function, mobility and decreased fall risk.  Baseline: 20.49 Goal status: INITIAL  5.  Pt will improve on strength of bilat hips at least 1 full grade  Baseline: see chart Goal status: INITIAL  6.  Pt will be able to walk through grocery store unlimited by fatigue or pain pushing cart to demonstrate improvement in functional mobility. Baseline: >5 mins Goal status: INITIAL  PLAN:  PT FREQUENCY: 1-2x/week  PT DURATION: other: 9 weeks  PLANNED INTERVENTIONS: Therapeutic exercises, Therapeutic activity, Neuromuscular re-education, Balance training, Gait training, Patient/Family education, Self Care, Joint mobilization, Joint manipulation, Stair training, Orthotic/Fit training, DME instructions, Aquatic Therapy, Dry Needling, Electrical stimulation, Spinal manipulation, Spinal mobilization, Cryotherapy, Moist heat, scar mobilization, Splintting, Taping, Traction, Ultrasound, Ionotophoresis 41m/ml Dexamethasone, Manual therapy, and Re-evaluation.  PLAN FOR NEXT SESSION: Aquatics: stretching and strengthening core, hips, LE. Balance and gait retraining,  Posture retraining. Pain reduction.  JKerin Perna PTA 12/25/22 9:07 AM [facility] [address] [place] [address]Phone: [phone]  Fax:  [phone]

## 2022-12-26 DIAGNOSIS — J3089 Other allergic rhinitis: Secondary | ICD-10-CM | POA: Diagnosis not present

## 2022-12-26 DIAGNOSIS — J3081 Allergic rhinitis due to animal (cat) (dog) hair and dander: Secondary | ICD-10-CM | POA: Diagnosis not present

## 2022-12-26 DIAGNOSIS — J301 Allergic rhinitis due to pollen: Secondary | ICD-10-CM | POA: Diagnosis not present

## 2022-12-31 ENCOUNTER — Encounter (HOSPITAL_BASED_OUTPATIENT_CLINIC_OR_DEPARTMENT_OTHER): Payer: Self-pay | Admitting: Physical Therapy

## 2022-12-31 ENCOUNTER — Ambulatory Visit (HOSPITAL_BASED_OUTPATIENT_CLINIC_OR_DEPARTMENT_OTHER): Payer: Medicare HMO | Admitting: Physical Therapy

## 2022-12-31 DIAGNOSIS — R2689 Other abnormalities of gait and mobility: Secondary | ICD-10-CM | POA: Diagnosis not present

## 2022-12-31 DIAGNOSIS — M6281 Muscle weakness (generalized): Secondary | ICD-10-CM | POA: Diagnosis not present

## 2022-12-31 DIAGNOSIS — M5459 Other low back pain: Secondary | ICD-10-CM

## 2022-12-31 DIAGNOSIS — R293 Abnormal posture: Secondary | ICD-10-CM

## 2022-12-31 DIAGNOSIS — M5451 Vertebrogenic low back pain: Secondary | ICD-10-CM | POA: Diagnosis not present

## 2022-12-31 NOTE — Therapy (Signed)
OUTPATIENT PHYSICAL THERAPY THORACOLUMBAR TREATMENT   Patient Name: Haley White MRN: [id number] DOB:[birth year], 67 y.o., female Today's Date: 12/31/2022  END OF SESSION:  PT End of Session - 12/31/22 0817     Visit Number 4    Number of Visits 16    Date for PT Re-Evaluation 02/21/23    Authorization Type aetna medicare    PT Start Time [phone]   pt arrived late to pool area   PT Stop Time 0857    PT Time Calculation (min) 33 min    Activity Tolerance Patient tolerated treatment well    Behavior During Therapy [facility] for tasks assessed/performed             Past Medical History:  Diagnosis Date   Anxiety    Arthritis    Asthma    Cataract    Gout    Hyperlipidemia    Hypertension    Obese    Past Surgical History:  Procedure Laterality Date   APPENDECTOMY  1984   CATARACT EXTRACTION W/ INTRAOCULAR LENS  IMPLANT, BILATERAL  2014   CESAREAN SECTION  1984, 1987, 1994   X3   COLONOSCOPY  11/16/2013   w/Brodie    FOOT SURGERY Left 2002   KNEE SURGERY Left 1998   ROTATOR CUFF REPAIR Right 2007   TUBAL LIGATION  1994   Patient Active Problem List   Diagnosis Date Noted   DOE (dyspnea on exertion) 05/10/2022   Acute pulmonary embolism (HCC) 05/10/2022   Allergic rhinitis 03/15/2021   Gastro-esophageal reflux disease without esophagitis 03/15/2021   Mild persistent asthma, uncomplicated [id number]   Aortic atherosclerosis ([place]) by Chest CT scan on 02/02/2018  01/10/2021   Asthma    Low back pain 03/17/2019   Lumbosacral spondylosis without myelopathy 03/17/2019   Cubital tunnel syndrome 10/14/2018   Closed fracture of distal end of right radius 07/09/2018   Pseudogout 12/09/2017   Major depressive disorder, recurrent, severe without psychotic features ([place])    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   Suicide attempt by drug ingestion ([place])    Medication management 08/22/2014   Vitamin D deficiency 08/22/2014   Hyperlipidemia, mixed 08/22/2014   Hypertension     Anxiety    Abnormal glucose    Class 2 severe obesity due to excess calories with serious comorbidity and body mass index (BMI) of 37.0 to 37.9 in adult [facility])     PCP: Unk Pinto, MD   REFERRING PROVIDER: Susa Day, MD   REFERRING DIAG: M54.51 (ICD-10-CM) - Vertebrogenic low back pain   Rationale for Evaluation and Treatment: Rehabilitation  THERAPY DIAG:  Other low back pain  Muscle weakness (generalized)  Other abnormalities of gait and mobility  Abnormal posture  ONSET DATE: >19yr SUBJECTIVE:  SUBJECTIVE STATEMENT:   PERTINENT HISTORY:  Patient is a very nice 67 yo div WF  with   HTN, HLD, Prediabetes  and Vitamin D Deficiency and had  hx/o recent Covid PNA~ May 2023  and  and in July 2023 was discovered to have a Rt sided PE and was hospitalized for Heparin & transitioned to Eliquis ( to continue 6 months  thru Jan 7 ) .    She was seen 5-6 weeks ago for evaluation for c/o  "shaking & forgetfulness " .  She reported poor short  & long term memory recall.  (Pt discontinued meds as per MD and reports back to baseline with memory)  PAIN:  Are you having pain? Yes: NPRS scale: 3/10 Pain location: Low lumbar area across hip to hip Pain description: ache, dull  Aggravating factors: standing <4 mins; walking >5 mins Relieving factors: Aspen lumbar brace; tylenol, ms relaxer  PRECAUTIONS: Back and Fall  WEIGHT BEARING RESTRICTIONS: No  FALLS:  Has patient fallen in last 6 months? No  LIVING ENVIRONMENT: Lives with: lives alone Lives in: House/apartment Stairs: Yes: External: 3 steps; none Has following equipment at home: Single point cane  OCCUPATION: retired  PLOF: Independent  PATIENT GOALS: less pain, be able to stand and walk longer, get strength back  NEXT MD  VISIT: next 2 weeks  OBJECTIVE:   DIAGNOSTIC FINDINGS:  Pt reports recent MRI at [facility] but no results in [place]: foot pain, soft tissue mass of left third toe US IMPRESSION: Focal tenosynovitis of an extensor tendon in the dorsal foot, with adjacent soft tissue swelling.    PATIENT SURVEYS:  FOTO 60 primary measure with goal of 50% on 12th visit  SCREENING FOR RED FLAGS:  Neg  COGNITION: Overall cognitive status: Within functional limits for tasks assessed     SENSATION: WFL Occasional P&N bilateral hips when back pain is increased  MUSCLE LENGTH: Hamstrings: Right 80 deg; Left 85 deg   POSTURE: rounded shoulders, anterior pelvic tilt, and flexed trunk   PALPATION: TTP moderately sensitive through lower lumbar and sacral area    LUMBAR ROM:   AROM eval  Flexion Finger tips to knees  Extension 25%  Right lateral flexion 10%  Left lateral flexion 25%  Right rotation 50%  Left rotation 50%   All movements limited by PAIN  LOWER EXTREMITY ROM:     WFL  LOWER EXTREMITY MMT:        P!=Pain MMT Right eval Left eval  Hip flexion 3+P! 3+P! Tested supine  Hip extension 3+P! 3+P!  Hip abduction 5 5  Hip adduction 5 5  Hip internal rotation    Hip external rotation    Knee flexion 5 5  Knee extension    Ankle dorsiflexion 5 5  Ankle plantarflexion    Ankle inversion    Ankle eversion     (Blank rows = not tested)  LUMBAR SPECIAL TESTS:  Straight leg raise test: Positive, Slump test: Negative, and Thomas test: Negative  FUNCTIONAL TESTS:  30s sit to stand: 2 Timed up and go (TUG): 20.49 Berg Balance Scale: 26/56  GAIT: Distance walked: 400 Assistive device utilized: None Level of assistance:  Indep  Comments: forward hip flex, dragging heels, no arm swing, slowed cadence and shortened step length  TODAY'S TREATMENT:  Pt seen for aquatic therapy today.  Treatment took place in water 3.5-4.5 ft in depth at the [place]. Temp of water was 92.  Pt entered/exited the pool via stairs using step to pattern with hand  rail.  *Walking forward, back and side stepping unsupported 4 ft, cues for vertical trunk *L stretch standing, with gentle wag the tail x 2 reps  * marching backward with reciprocal arm swing * forward / backward circumduct LE walk * holding wall in 4+ feet:   partial squat to/from heel raises  * TrA set with short noodle pull down to thighs, with various stops in between surface with isometric holds * TrA and lower trap set with rainbow hand float tricep press down, low back stretch; bilat shoulder addct/abdct, then side stepping with shoulder addct * straddling yellow noodle and cycling in deeper water with breast stroke arms   Pt requires the buoyancy and hydrostatic pressure of water for support, and to offload joints by unweighting joint load by at least 50 % in navel deep water and by at least 75-80% in chest to neck deep water.  Viscosity of the water is needed for resistance of strengthening. Water current perturbations provides challenge to standing balance requiring increased core activation.   PATIENT EDUCATION:  Education details: aquatic progressions and modifications  Person educated: Patient Education method: Customer service manager Education comprehension: verbalized understanding  HOME EXERCISE PROGRAM: Access Code: [place] URL: [email or web address] Date: 12/20/2022 Prepared by: Denton Meek  Exercises - Standing 'L' Stretch at [employer]  - 1 x daily - 7 x weekly - 3 sets - 10 reps - Supine Posterior Pelvic Tilt  - 1 x daily - 7 x weekly - 3 sets - 10 reps  ASSESSMENT:  CLINICAL IMPRESSION: Pt reported continued lower back tightness throughout session; stretched lower back as needed.   Focus today was on posture and core  engagement with movement in the water. Plan to add soleus stretch next visit (for tight Lt ankle). Pt will benefit from the properties of water to progress towards goals. Encouraged pt to stick with walking in water, and cycling as she did today as her starting aquatic HEP; plan to create formal HEP as she progresses in sessions.  Goals are ongoing.     OBJECTIVE IMPAIRMENTS: Abnormal gait, decreased activity tolerance, decreased balance, decreased endurance, decreased knowledge of use of DME, decreased mobility, difficulty walking, decreased ROM, decreased strength, obesity, and pain.   ACTIVITY LIMITATIONS: carrying, lifting, bending, sitting, standing, squatting, sleeping, stairs, transfers, bed mobility, and locomotion level  PARTICIPATION LIMITATIONS: cleaning, laundry, shopping, community activity, and yard work  PERSONAL FACTORS: Fitness, Past/current experiences, and 1-2 comorbidities: HTN, ankle dysfunction  are also affecting patient's functional outcome.   REHAB POTENTIAL: Good  CLINICAL DECISION MAKING: Evolving/moderate complexity  EVALUATION COMPLEXITY: Moderate   GOALS: Goals reviewed with patient? Yes  SHORT TERM GOALS: Target date: 01/17/23  Pt will tolerate full aquatic sessions consistently without increase in pain and with improving function to demonstrate good toleration and effectiveness of intervention.   Baseline: Goal status: INITIAL  2.  Pt will improve on 30S sit to stand test to >/= 7 (1/2 of norm value) to demonstrate improving mobility and strength Baseline: 2 Goal status: INITIAL  3.  Pt will improve on lumbar ROM by 50% in all areas with decreasing pain Baseline: see chart Goal status: INITIAL  4.  Pt will report increased toleration to standing up towards 10 minutes to fix a meal. Baseline: <  4 min Goal status: INITIAL  5.  Pt will report an overall decrease in max/worst pain to <6/10 to improve sleep pattern and decrease physiological responses to  pain Baseline: 8/10 Goal status: INITIAL     LONG TERM GOALS: Target date: 02/21/23  Pt to meet stated Foto Goal of 50 Baseline: 37% primary Goal status: INITIAL  2.  Pt will be indep with final HEP's (land and aquatic as appropriate) for continued management of condition  Baseline:  Goal status: INITIAL  3.  Pt will improve on Berg balance test to >/= 40/56 to demonstrate a decrease in fall risk.   Baseline: 26/56 Goal status: INITIAL  4.  Pt will improve on Tug test to </=  12s to demonstrate improvement in lower extremity function, mobility and decreased fall risk.  Baseline: 20.49 Goal status: INITIAL  5.  Pt will improve on strength of bilat hips at least 1 full grade  Baseline: see chart Goal status: INITIAL  6.  Pt will be able to walk through grocery store unlimited by fatigue or pain pushing cart to demonstrate improvement in functional mobility. Baseline: >5 mins Goal status: INITIAL  PLAN:  PT FREQUENCY: 1-2x/week  PT DURATION: other: 9 weeks  PLANNED INTERVENTIONS: Therapeutic exercises, Therapeutic activity, Neuromuscular re-education, Balance training, Gait training, Patient/Family education, Self Care, Joint mobilization, Joint manipulation, Stair training, Orthotic/Fit training, DME instructions, Aquatic Therapy, Dry Needling, Electrical stimulation, Spinal manipulation, Spinal mobilization, Cryotherapy, Moist heat, scar mobilization, Splintting, Taping, Traction, Ultrasound, Ionotophoresis '4mg'$ /ml Dexamethasone, Manual therapy, and Re-evaluation.  PLAN FOR NEXT SESSION: Aquatics: stretching and strengthening core, hips, LE. Balance and gait retraining, Posture retraining. Pain reduction.  Kerin Perna, PTA 12/31/22 9:05 AM [facility] [address] [place], [place], [address] Phone: [phone]   Fax:  [phone]

## 2023-01-03 ENCOUNTER — Ambulatory Visit (HOSPITAL_BASED_OUTPATIENT_CLINIC_OR_DEPARTMENT_OTHER): Payer: Medicare HMO | Attending: Specialist | Admitting: Physical Therapy

## 2023-01-03 ENCOUNTER — Encounter (HOSPITAL_BASED_OUTPATIENT_CLINIC_OR_DEPARTMENT_OTHER): Payer: Self-pay | Admitting: Physical Therapy

## 2023-01-03 DIAGNOSIS — J301 Allergic rhinitis due to pollen: Secondary | ICD-10-CM | POA: Diagnosis not present

## 2023-01-03 DIAGNOSIS — R2689 Other abnormalities of gait and mobility: Secondary | ICD-10-CM

## 2023-01-03 DIAGNOSIS — M5459 Other low back pain: Secondary | ICD-10-CM | POA: Diagnosis not present

## 2023-01-03 DIAGNOSIS — M6281 Muscle weakness (generalized): Secondary | ICD-10-CM | POA: Diagnosis not present

## 2023-01-03 DIAGNOSIS — R293 Abnormal posture: Secondary | ICD-10-CM | POA: Insufficient documentation

## 2023-01-03 NOTE — Therapy (Signed)
OUTPATIENT PHYSICAL THERAPY THORACOLUMBAR TREATMENT   Patient Name: Haley White MRN: [id number] DOB:Sep 02, 1956, 67 y.o., female Today's Date: 01/03/2023  END OF SESSION:  PT End of Session - 01/03/23 0943     Visit Number 5    Number of Visits 16    Date for PT Re-Evaluation 02/21/23    Authorization Type aetna medicare    PT Start Time 0940    PT Stop Time 1025    PT Time Calculation (min) 45 min    Activity Tolerance Patient tolerated treatment well    Behavior During Therapy WFL for tasks assessed/performed             Past Medical History:  Diagnosis Date   Anxiety    Arthritis    Asthma    Cataract    Gout    Hyperlipidemia    Hypertension    Obese    Past Surgical History:  Procedure Laterality Date   APPENDECTOMY  1984   CATARACT EXTRACTION W/ INTRAOCULAR LENS  IMPLANT, BILATERAL  2014   CESAREAN SECTION  1984, 1987, 1994   X3   COLONOSCOPY  11/16/2013   w/Brodie    FOOT SURGERY Left 2002   KNEE SURGERY Left 1998   ROTATOR CUFF REPAIR Right 2007   TUBAL LIGATION  1994   Patient Active Problem List   Diagnosis Date Noted   DOE (dyspnea on exertion) 05/10/2022   Acute pulmonary embolism (HCC) 05/10/2022   Allergic rhinitis 03/15/2021   Gastro-esophageal reflux disease without esophagitis 03/15/2021   Mild persistent asthma, uncomplicated [id number]   Aortic atherosclerosis ([place]) by Chest CT scan on 02/02/2018  01/10/2021   Asthma    Low back pain 03/17/2019   Lumbosacral spondylosis without myelopathy 03/17/2019   Cubital tunnel syndrome 10/14/2018   Closed fracture of distal end of right radius 07/09/2018   Pseudogout 12/09/2017   Major depressive disorder, recurrent, severe without psychotic features ([place])    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   Suicide attempt by drug ingestion ([place])    Medication management 08/22/2014   Vitamin D deficiency 08/22/2014   Hyperlipidemia, mixed 08/22/2014   Hypertension    Anxiety    Abnormal glucose     Class 2 severe obesity due to excess calories with serious comorbidity and body mass index (BMI) of 37.0 to 37.9 in adult [facility])     PCP: Unk Pinto, MD   REFERRING PROVIDER: Susa Day, MD   REFERRING DIAG: M54.51 (ICD-10-CM) - Vertebrogenic low back pain   Rationale for Evaluation and Treatment: Rehabilitation  THERAPY DIAG:  Other low back pain  Muscle weakness (generalized)  Other abnormalities of gait and mobility  Abnormal posture  ONSET DATE: >39yr SUBJECTIVE:  SUBJECTIVE STATEMENT: I can tell its going to rain today  PERTINENT HISTORY:  Patient is a very nice 67 yo div WF  with   HTN, HLD, Prediabetes  and Vitamin D Deficiency and had  hx/o recent Covid PNA~ May 2023  and  and in July 2023 was discovered to have a Rt sided PE and was hospitalized for Heparin & transitioned to Eliquis ( to continue 6 months  thru Jan 7 ) .    She was seen 5-6 weeks ago for evaluation for c/o  "shaking & forgetfulness " .  She reported poor short  & long term memory recall.  (Pt discontinued meds as per MD and reports back to baseline with memory)  PAIN:  Are you having pain? Yes: NPRS scale: 4/10 Pain location: Low lumbar area across hip to hip Pain description: ache, dull  Aggravating factors: standing <4 mins; walking >5 mins Relieving factors: Aspen lumbar brace; tylenol, ms relaxer  PRECAUTIONS: Back and Fall  WEIGHT BEARING RESTRICTIONS: No  FALLS:  Has patient fallen in last 6 months? No  LIVING ENVIRONMENT: Lives with: lives alone Lives in: House/apartment Stairs: Yes: External: 3 steps; none Has following equipment at home: Single point cane  OCCUPATION: retired  PLOF: Independent  PATIENT GOALS: less pain, be able to stand and walk longer, get strength back  NEXT MD  VISIT: next 2 weeks  OBJECTIVE:   DIAGNOSTIC FINDINGS:  Pt reports recent MRI at [facility] but no results in [place]: foot pain, soft tissue mass of left third toe US IMPRESSION: Focal tenosynovitis of an extensor tendon in the dorsal foot, with adjacent soft tissue swelling.    PATIENT SURVEYS:  FOTO 29 primary measure with goal of 50% on 12th visit  SCREENING FOR RED FLAGS:  Neg  COGNITION: Overall cognitive status: Within functional limits for tasks assessed     SENSATION: WFL Occasional P&N bilateral hips when back pain is increased  MUSCLE LENGTH: Hamstrings: Right 80 deg; Left 85 deg   POSTURE: rounded shoulders, anterior pelvic tilt, and flexed trunk   PALPATION: TTP moderately sensitive through lower lumbar and sacral area    LUMBAR ROM:   AROM eval  Flexion Finger tips to knees  Extension 25%  Right lateral flexion 10%  Left lateral flexion 25%  Right rotation 50%  Left rotation 50%   All movements limited by PAIN  LOWER EXTREMITY ROM:     WFL  LOWER EXTREMITY MMT:        P!=Pain MMT Right eval Left eval  Hip flexion 3+P! 3+P! Tested supine  Hip extension 3+P! 3+P!  Hip abduction 5 5  Hip adduction 5 5  Hip internal rotation    Hip external rotation    Knee flexion 5 5  Knee extension    Ankle dorsiflexion 5 5  Ankle plantarflexion    Ankle inversion    Ankle eversion     (Blank rows = not tested)  LUMBAR SPECIAL TESTS:  Straight leg raise test: Positive, Slump test: Negative, and Thomas test: Negative  FUNCTIONAL TESTS:  30s sit to stand: 2 Timed up and go (TUG): 20.49 Berg Balance Scale: 26/56  GAIT: Distance walked: 400 Assistive device utilized: None Level of assistance:  Indep  Comments: forward hip flex, dragging heels, no arm swing, slowed cadence and shortened step length  TODAY'S TREATMENT:  Pt seen for aquatic therapy today.  Treatment took place in water 3.5-4.5 ft in depth at the [place]. Temp of water was 92.  Pt entered/exited the pool via stairs using step to pattern with hand  rail.  *Walking forward, back and side stepping unsupported 4 ft, cues for vertical trunk *L stretch standing, with gentle wag the tail x 2 reps  *lumbar rotation * straddling yellow noodle and cycling in deeper water with breast stroke arms  * TrA and lower trap set with rainbow hand float tricep press down, low back stretch; bilat shoulder addct/abdct * TrA set with short noodle pull down to thighs, LE staggered then wide x10 ea position *hip hinges. Cues for positioning and execution *return to cycling on noodle *gastroc and soleus stretch   Pt requires the buoyancy and hydrostatic pressure of water for support, and to offload joints by unweighting joint load by at least 50 % in navel deep water and by at least 75-80% in chest to neck deep water.  Viscosity of the water is needed for resistance of strengthening. Water current perturbations provides challenge to standing balance requiring increased core activation.   PATIENT EDUCATION:  Education details: aquatic progressions and modifications  Person educated: Patient Education method: Customer service manager Education comprehension: verbalized understanding  HOME EXERCISE PROGRAM: Access Code: [place] URL: [email or web address] Date: 12/20/2022 Prepared by: Denton Meek  Exercises: land based exercises modified for aquatics - Standing 'L' Stretch at [employer]  - 1 x daily - 7 x weekly - 3 sets - 10 reps  Added 01/03/23 -Seated Straddle on Flotation Forward Breast Stroke Arms and Bicycle Legs  - 1 x daily - 7 x weekly - 3 sets - 10 reps - Standing Low Shoulder Row with Anchored Resistance   ASSESSMENT:  CLINICAL IMPRESSION: Increased LBP pt attributes to the weather.  She had  reduction in pain with cycling but increased with other tasks. Stretched soleus bilaterally although poor toleration on left with prior ankle/foot injury. Added exercises to aquatic HEP as pt is now going to [facility] to complete.  She does have overall reduction In pain upon completion of session. 2/10. Pt will continue to benefit from the properties of water to progress towards goals.  Pt reported continued lower back tightness throughout session; stretched lower back as needed.   Focus today was on posture and core engagement with movement in the water. Plan to add soleus stretch next visit (for tight Lt ankle).  Encouraged pt to stick with walking in water, and cycling as she did today as her starting aquatic HEP; plan to create formal HEP as she progresses in sessions.  Goals are ongoing.     OBJECTIVE IMPAIRMENTS: Abnormal gait, decreased activity tolerance, decreased balance, decreased endurance, decreased knowledge of use of DME, decreased mobility, difficulty walking, decreased ROM, decreased strength, obesity, and pain.   ACTIVITY LIMITATIONS: carrying, lifting, bending, sitting, standing, squatting, sleeping, stairs, transfers, bed mobility, and locomotion level  PARTICIPATION LIMITATIONS: cleaning, laundry, shopping, community activity, and yard work  PERSONAL FACTORS: Fitness, Past/current experiences, and 1-2 comorbidities: HTN, ankle dysfunction  are also affecting patient's functional outcome.   REHAB POTENTIAL: Good  CLINICAL DECISION MAKING: Evolving/moderate complexity  EVALUATION COMPLEXITY: Moderate   GOALS: Goals reviewed with patient? Yes  SHORT TERM GOALS: Target date: 01/17/23  Pt will tolerate full aquatic sessions consistently without increase in pain and with improving function to demonstrate good toleration and effectiveness of intervention.   Baseline: Goal status: INITIAL  2.  Pt will improve on 30S sit to stand test to >/= 7 (1/2 of norm value) to demonstrate  improving mobility and strength Baseline: 2 Goal status: INITIAL  3.  Pt will improve on lumbar ROM by 50% in all areas with decreasing pain Baseline: see chart Goal status: INITIAL  4.  Pt will report increased toleration to standing up towards 10 minutes to fix a meal. Baseline: <4 min Goal status: INITIAL  5.  Pt will report an overall decrease in max/worst pain to <6/10 to improve sleep pattern and decrease physiological responses to pain Baseline: 8/10 Goal status: INITIAL     LONG TERM GOALS: Target date: 02/21/23  Pt to meet stated Foto Goal of 50 Baseline: 37% primary Goal status: INITIAL  2.  Pt will be indep with final HEP's (land and aquatic as appropriate) for continued management of condition  Baseline:  Goal status: INITIAL  3.  Pt will improve on Berg balance test to >/= 40/56 to demonstrate a decrease in fall risk.   Baseline: 26/56 Goal status: INITIAL  4.  Pt will improve on Tug test to </=  12s to demonstrate improvement in lower extremity function, mobility and decreased fall risk.  Baseline: 20.49 Goal status: INITIAL  5.  Pt will improve on strength of bilat hips at least 1 full grade  Baseline: see chart Goal status: INITIAL  6.  Pt will be able to walk through grocery store unlimited by fatigue or pain pushing cart to demonstrate improvement in functional mobility. Baseline: >5 mins Goal status: INITIAL  PLAN:  PT FREQUENCY: 1-2x/week  PT DURATION: other: 9 weeks  PLANNED INTERVENTIONS: Therapeutic exercises, Therapeutic activity, Neuromuscular re-education, Balance training, Gait training, Patient/Family education, Self Care, Joint mobilization, Joint manipulation, Stair training, Orthotic/Fit training, DME instructions, Aquatic Therapy, Dry Needling, Electrical stimulation, Spinal manipulation, Spinal mobilization, Cryotherapy, Moist heat, scar mobilization, Splintting, Taping, Traction, Ultrasound, Ionotophoresis '4mg'$ /ml Dexamethasone, Manual  therapy, and Re-evaluation.  PLAN FOR NEXT SESSION: Aquatics: stretching and strengthening core, hips, LE. Balance and gait retraining, Posture retraining. Pain reduction.  Ethelreda [place]) Chayson Charters MPT 01/03/23 10:17 AM [facility] [address] [place], [place], [address] Phone: [phone]   Fax:  [phone]

## 2023-01-07 ENCOUNTER — Ambulatory Visit (HOSPITAL_BASED_OUTPATIENT_CLINIC_OR_DEPARTMENT_OTHER): Payer: Medicare HMO | Admitting: Physical Therapy

## 2023-01-07 ENCOUNTER — Encounter (HOSPITAL_BASED_OUTPATIENT_CLINIC_OR_DEPARTMENT_OTHER): Payer: Self-pay | Admitting: Physical Therapy

## 2023-01-07 DIAGNOSIS — M6281 Muscle weakness (generalized): Secondary | ICD-10-CM

## 2023-01-07 DIAGNOSIS — R293 Abnormal posture: Secondary | ICD-10-CM | POA: Diagnosis not present

## 2023-01-07 DIAGNOSIS — R2689 Other abnormalities of gait and mobility: Secondary | ICD-10-CM

## 2023-01-07 DIAGNOSIS — M5459 Other low back pain: Secondary | ICD-10-CM | POA: Diagnosis not present

## 2023-01-07 NOTE — Therapy (Signed)
OUTPATIENT PHYSICAL THERAPY THORACOLUMBAR TREATMENT   Patient Name: Haley White MRN: [id number] DOB:[birth year], 67 y.o., female Today's Date: 01/07/2023  END OF SESSION:  PT End of Session - 01/07/23 1027     Visit Number 6    Number of Visits 16    Date for PT Re-Evaluation 02/21/23    Authorization Type aetna medicare    PT Start Time 1025    PT Stop Time 1108    PT Time Calculation (min) 43 min    Activity Tolerance Patient tolerated treatment well    Behavior During Therapy WFL for tasks assessed/performed             Past Medical History:  Diagnosis Date   Anxiety    Arthritis    Asthma    Cataract    Gout    Hyperlipidemia    Hypertension    Obese    Past Surgical History:  Procedure Laterality Date   APPENDECTOMY  1984   CATARACT EXTRACTION W/ INTRAOCULAR LENS  IMPLANT, BILATERAL  2014   CESAREAN SECTION  1984, 1987, 1994   X3   COLONOSCOPY  11/16/2013   w/Brodie    FOOT SURGERY Left 2002   KNEE SURGERY Left 1998   ROTATOR CUFF REPAIR Right 2007   TUBAL LIGATION  1994   Patient Active Problem List   Diagnosis Date Noted   DOE (dyspnea on exertion) 05/10/2022   Acute pulmonary embolism ([place]) 05/10/2022   Allergic rhinitis 03/15/2021   Gastro-esophageal reflux disease without esophagitis 03/15/2021   Mild persistent asthma, uncomplicated [id number]   Aortic atherosclerosis ([place]) by Chest CT scan on 02/02/2018  01/10/2021   Asthma    Low back pain 03/17/2019   Lumbosacral spondylosis without myelopathy 03/17/2019   Cubital tunnel syndrome 10/14/2018   Closed fracture of distal end of right radius 07/09/2018   Pseudogout 12/09/2017   Major depressive disorder, recurrent, severe without psychotic features ([place])    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   Suicide attempt by drug ingestion ([name])    Medication management 08/22/2014   Vitamin D deficiency 08/22/2014   Hyperlipidemia, mixed 08/22/2014   Hypertension    Anxiety    Abnormal glucose     Class 2 severe obesity due to excess calories with serious comorbidity and body mass index (BMI) of 37.0 to 37.9 in adult [facility])     PCP: Unk Pinto, MD   REFERRING PROVIDER: Susa Day, MD   REFERRING DIAG: M54.51 (ICD-10-CM) - Vertebrogenic low back pain   Rationale for Evaluation and Treatment: Rehabilitation  THERAPY DIAG:  Other low back pain  Muscle weakness (generalized)  Other abnormalities of gait and mobility  Abnormal posture  ONSET DATE: >85yr SUBJECTIVE:  SUBJECTIVE STATEMENT: I'm telling ya, it's gonna rain.   PERTINENT HISTORY:  Patient is a very nice 67 yo div WF  with   HTN, HLD, Prediabetes  and Vitamin D Deficiency and had  hx/o recent Covid PNA~ May 2023  and  and in July 2023 was discovered to have a Rt sided PE and was hospitalized for Heparin & transitioned to Eliquis ( to continue 6 months  thru Jan 7 ) .    She was seen 5-6 weeks ago for evaluation for c/o  "shaking & forgetfulness " .  She reported poor short  & long term memory recall.  (Pt discontinued meds as per MD and reports back to baseline with memory)  PAIN:  Are you having pain? Yes: NPRS scale: 4/10 Pain location: Low lumbar area across hip to hip Pain description: ache, dull  Aggravating factors: standing <4 mins; walking >5 mins Relieving factors: Aspen lumbar brace; tylenol, ms relaxer  PRECAUTIONS: Back and Fall  WEIGHT BEARING RESTRICTIONS: No  FALLS:  Has patient fallen in last 6 months? No  LIVING ENVIRONMENT: Lives with: lives alone Lives in: House/apartment Stairs: Yes: External: 3 steps; none Has following equipment at home: Single point cane  OCCUPATION: retired  PLOF: Independent  PATIENT GOALS: less pain, be able to stand and walk longer, get strength back  NEXT MD  VISIT: next 2 weeks  OBJECTIVE:   DIAGNOSTIC FINDINGS:  Pt reports recent MRI at [place] but no results in [place]: foot pain, soft tissue mass of left third toe US IMPRESSION: Focal tenosynovitis of an extensor tendon in the dorsal foot, with adjacent soft tissue swelling.    PATIENT SURVEYS:  FOTO 40 primary measure with goal of 50% on 12th visit 01/07/23:  42%   SCREENING FOR RED FLAGS:  Neg  COGNITION: Overall cognitive status: Within functional limits for tasks assessed     SENSATION: WFL Occasional P&N bilateral hips when back pain is increased  MUSCLE LENGTH: Hamstrings: Right 80 deg; Left 85 deg   POSTURE: rounded shoulders, anterior pelvic tilt, and flexed trunk   PALPATION: TTP moderately sensitive through lower lumbar and sacral area    LUMBAR ROM:   AROM eval  Flexion Finger tips to knees  Extension 25%  Right lateral flexion 10%  Left lateral flexion 25%  Right rotation 50%  Left rotation 50%   All movements limited by PAIN  LOWER EXTREMITY ROM:     WFL  LOWER EXTREMITY MMT:        P!=Pain MMT Right eval Left eval  Hip flexion 3+P! 3+P! Tested supine  Hip extension 3+P! 3+P!  Hip abduction 5 5  Hip adduction 5 5  Hip internal rotation    Hip external rotation    Knee flexion 5 5  Knee extension    Ankle dorsiflexion 5 5  Ankle plantarflexion    Ankle inversion    Ankle eversion     (Blank rows = not tested)  LUMBAR SPECIAL TESTS:  Straight leg raise test: Positive, Slump test: Negative, and Thomas test: Negative  FUNCTIONAL TESTS:  30s sit to stand: 2 Timed up and go (TUG): 20.49 Berg Balance Scale: 26/56  GAIT: Distance walked: 400 Assistive device utilized: None Level of assistance:  Indep  Comments: forward hip flex, dragging heels, no arm swing, slowed cadence and shortened step length  TODAY'S TREATMENT:  Pt seen for aquatic therapy today.  Treatment took place in water 3.5-4.5 ft in depth at the [facility]. Temp of water was 92.  Pt entered/exited the pool via stairs using step to pattern with hand  rail.  *Walking forward, backwards unsupported 4 ft, cues for vertical trunk * side stepping with rainbow hand floats x 2 laps * TrA set with short noodle -> solid noodle -> kick board pull down to thighs, with 4 sec return to surfa * gentle trunk rotation with hands resting on board  * staggered stance with kick board push/pull * forward step ups with RUE on wall x 10 L/x 10 R * STS at bench with feet on blue step, core engaged, hip hinge * at stairs:  bilat gastroc stretch, R/L hamstring stretch, L stretch   Pt requires the buoyancy and hydrostatic pressure of water for support, and to offload joints by unweighting joint load by at least 50 % in navel deep water and by at least 75-80% in chest to neck deep water.  Viscosity of the water is needed for resistance of strengthening. Water current perturbations provides challenge to standing balance requiring increased core activation.   PATIENT EDUCATION:  Education details: aquatic progressions and modifications - issued laminated HEP Person educated: Patient Education method: Customer service manager Education comprehension: verbalized understanding  HOME EXERCISE PROGRAM: Access Code: [place] URL: [email or web address] Date: 01/07/2023 Prepared by: [place]  Exercises - Walking March  - 1 x daily - 7 x weekly - Side Stepping with Hand Floats  - 1 x daily - 7 x weekly - Seated Straddle on Flotation Forward Breast Stroke Arms and Bicycle Legs  - 1 x daily - 7 x weekly - 3 sets - 10 reps - Shoulder Extension with Resistance  - 1 x daily - 7 x weekly - 1-2 sets - 10 reps - Split Stance Shoulder Row with Resistance  - 1 x daily - 7 x weekly - 1-2  sets - 10 reps - Standing Hip Abduction  - 1 x daily - 7 x weekly - 1-2 sets - 10 reps - Standing Hamstring Stretch on Chair  - 2 x daily - 7 x weekly - 2 reps - 20 seconds  hold - Standing 'L' Stretch at [employer]  - 1 x daily - 7 x weekly - 2 reps  ASSESSMENT:  CLINICAL IMPRESSION: Pt reports "tightness" in lower back while in water, and reduction of pain to 2/10.  Issued laminated HEP today; requested pt bring to future visits so that we can add exercises to it as appropriate. Pt's FOTO score improved, but she is not at goal yet.  Pt will continue to benefit from the properties of water to progress towards goals. Plan to add balance exercises next visit to assist with improved Berg.       OBJECTIVE IMPAIRMENTS: Abnormal gait, decreased activity tolerance, decreased balance, decreased endurance, decreased knowledge of use of DME, decreased mobility, difficulty walking, decreased ROM, decreased strength, obesity, and pain.   ACTIVITY LIMITATIONS: carrying, lifting, bending, sitting, standing, squatting, sleeping, stairs, transfers, bed mobility, and locomotion level  PARTICIPATION LIMITATIONS: cleaning, laundry, shopping, community activity, and yard work  PERSONAL FACTORS: Fitness, Past/current experiences, and 1-2 comorbidities: HTN, ankle dysfunction  are also affecting patient's functional outcome.   REHAB POTENTIAL: Good  CLINICAL DECISION MAKING: Evolving/moderate complexity  EVALUATION COMPLEXITY: Moderate   GOALS: Goals reviewed with patient? Yes  SHORT TERM GOALS: Target date: 01/17/23  Pt will tolerate full aquatic sessions consistently without increase in pain and with improving function to demonstrate good toleration and effectiveness of intervention.   Baseline: Goal status: INITIAL  2.  Pt will improve on 30S sit to stand test to >/= 7 (1/2 of norm value) to demonstrate improving mobility and strength Baseline: 2 Goal status: INITIAL  3.  Pt will improve on lumbar  ROM by 50% in all areas with decreasing pain Baseline: see chart Goal status: INITIAL  4.  Pt will report increased toleration to standing up towards 10 minutes to fix a meal. Baseline: <4 min Goal status: INITIAL  5.  Pt will report an overall decrease in max/worst pain to <6/10 to improve sleep pattern and decrease physiological responses to pain Baseline: 8/10 Goal status: INITIAL     LONG TERM GOALS: Target date: 02/21/23  Pt to meet stated Foto Goal of 50 Baseline: 37% primary Goal status: INITIAL  2.  Pt will be indep with final HEP's (land and aquatic as appropriate) for continued management of condition  Baseline:  Goal status: INITIAL  3.  Pt will improve on Berg balance test to >/= 40/56 to demonstrate a decrease in fall risk.   Baseline: 26/56 Goal status: INITIAL  4.  Pt will improve on Tug test to </=  12s to demonstrate improvement in lower extremity function, mobility and decreased fall risk.  Baseline: 20.49 Goal status: INITIAL  5.  Pt will improve on strength of bilat hips at least 1 full grade  Baseline: see chart Goal status: INITIAL  6.  Pt will be able to walk through grocery store unlimited by fatigue or pain pushing cart to demonstrate improvement in functional mobility. Baseline: >5 mins Goal status: INITIAL  PLAN:  PT FREQUENCY: 1-2x/week  PT DURATION: other: 9 weeks  PLANNED INTERVENTIONS: Therapeutic exercises, Therapeutic activity, Neuromuscular re-education, Balance training, Gait training, Patient/Family education, Self Care, Joint mobilization, Joint manipulation, Stair training, Orthotic/Fit training, DME instructions, Aquatic Therapy, Dry Needling, Electrical stimulation, Spinal manipulation, Spinal mobilization, Cryotherapy, Moist heat, scar mobilization, Splintting, Taping, Traction, Ultrasound, Ionotophoresis '4mg'$ /ml Dexamethasone, Manual therapy, and Re-evaluation.  PLAN FOR NEXT SESSION: Aquatics: stretching and strengthening core,  hips, LE. Balance and gait retraining, Posture retraining. Pain reduction.  Kerin Perna, PTA 01/07/23 11:18 AM [facility] [address] [place], [place], [address] Phone: [phone]   Fax:  [phone]

## 2023-01-09 DIAGNOSIS — J3089 Other allergic rhinitis: Secondary | ICD-10-CM | POA: Diagnosis not present

## 2023-01-09 DIAGNOSIS — J301 Allergic rhinitis due to pollen: Secondary | ICD-10-CM | POA: Diagnosis not present

## 2023-01-09 DIAGNOSIS — M47816 Spondylosis without myelopathy or radiculopathy, lumbar region: Secondary | ICD-10-CM | POA: Diagnosis not present

## 2023-01-10 ENCOUNTER — Encounter (HOSPITAL_BASED_OUTPATIENT_CLINIC_OR_DEPARTMENT_OTHER): Payer: Self-pay | Admitting: Physical Therapy

## 2023-01-10 ENCOUNTER — Ambulatory Visit (HOSPITAL_BASED_OUTPATIENT_CLINIC_OR_DEPARTMENT_OTHER): Payer: Medicare HMO | Admitting: Physical Therapy

## 2023-01-10 DIAGNOSIS — M5459 Other low back pain: Secondary | ICD-10-CM

## 2023-01-10 DIAGNOSIS — R2689 Other abnormalities of gait and mobility: Secondary | ICD-10-CM | POA: Diagnosis not present

## 2023-01-10 DIAGNOSIS — R293 Abnormal posture: Secondary | ICD-10-CM | POA: Diagnosis not present

## 2023-01-10 DIAGNOSIS — M6281 Muscle weakness (generalized): Secondary | ICD-10-CM

## 2023-01-10 NOTE — Therapy (Signed)
OUTPATIENT PHYSICAL THERAPY THORACOLUMBAR TREATMENT   Patient Name: Haley White MRN: [id number] DOB:[birth year], 67 y.o., female Today's Date: 01/10/2023  END OF SESSION:  PT End of Session - 01/10/23 1031     Visit Number 7    Number of Visits 16    Date for PT Re-Evaluation 02/21/23    Authorization Type aetna medicare    PT Start Time 0945    PT Stop Time 1030    PT Time Calculation (min) 45 min    Activity Tolerance Patient tolerated treatment well    Behavior During Therapy WFL for tasks assessed/performed             Past Medical History:  Diagnosis Date   Anxiety    Arthritis    Asthma    Cataract    Gout    Hyperlipidemia    Hypertension    Obese    Past Surgical History:  Procedure Laterality Date   APPENDECTOMY  1984   CATARACT EXTRACTION W/ INTRAOCULAR LENS  IMPLANT, BILATERAL  2014   CESAREAN SECTION  1984, 1987, 1994   X3   COLONOSCOPY  11/16/2013   w/[place]rodie    FOOT SURGERY Left 2002   KNEE SURGERY Left 1998   ROTATOR CUFF REPAIR Right 2007   [place]   Patient Active Problem List   Diagnosis Date Noted   DOE (dyspnea on exertion) 05/10/2022   Acute pulmonary embolism ([facility]) 05/10/2022   Allergic rhinitis 03/15/2021   Gastro-esophageal reflux disease without esophagitis 03/15/2021   Mild persistent asthma, uncomplicated [id number]   Aortic atherosclerosis ([place]) by Chest CT scan on 02/02/2018  01/10/2021   Asthma    Low back pain 03/17/2019   Lumbosacral spondylosis without myelopathy 03/17/2019   Cubital tunnel syndrome 10/14/2018   Closed fracture of distal end of right radius 07/09/2018   Pseudogout 12/09/2017   Major depressive disorder, recurrent, severe without psychotic features ([place])    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   Suicide attempt by drug ingestion ([place])    Medication management 08/22/2014   Vitamin D deficiency 08/22/2014   Hyperlipidemia, mixed 08/22/2014   Hypertension    Anxiety    Abnormal glucose     Class 2 severe obesity due to excess calories with serious comorbidity and body mass index (BMI) of 37.0 to 37.9 in adult [facility])     PCP: Unk Pinto, MD   REFERRING PROVIDER: Susa Day, MD   REFERRING DIAG: M54.51 (ICD-10-CM) - Vertebrogenic low back pain   Rationale for Evaluation and Treatment: Rehabilitation  THERAPY DIAG:  Other low back pain  Muscle weakness (generalized)  Other abnormalities of gait and mobility  ONSET DATE: >63yr SUBJECTIVE:  SUBJECTIVE STATEMENT: Went to the [facility] yesterday and swam for the first time stomach back and side. Had injection yesterday and my pain  has greatly decreased.  PERTINENT HISTORY:  Patient is a very nice 67 yo div WF  with   HTN, HLD, Prediabetes  and Vitamin D Deficiency and had  hx/o recent Covid PNA~ May 2023  and  and in July 2023 was discovered to have a Rt sided PE and was hospitalized for Heparin & transitioned to Eliquis ( to continue 6 months  thru Jan 7 ) .    She was seen 5-6 weeks ago for evaluation for c/o  "shaking & forgetfulness " .  She reported poor short  & long term memory recall.  (Pt discontinued meds as per MD and reports back to baseline with memory)  PAIN:  Are you having pain? Yes: NPRS scale: 1-2/10 Pain location: Low lumbar area across hip to hip Pain description: ache, dull  Aggravating factors: standing <4 mins; walking >5 mins Relieving factors: Aspen lumbar brace; tylenol, ms relaxer  PRECAUTIONS: Back and Fall  WEIGHT BEARING RESTRICTIONS: No  FALLS:  Has patient fallen in last 6 months? No  LIVING ENVIRONMENT: Lives with: lives alone Lives in: House/apartment Stairs: Yes: External: 3 steps; none Has following equipment at home: Single point cane  OCCUPATION: retired  PLOF:  Independent  PATIENT GOALS: less pain, be able to stand and walk longer, get strength back  NEXT MD VISIT: next 2 weeks  OBJECTIVE:   DIAGNOSTIC FINDINGS:  Pt reports recent MRI at [place] but no results in [place]: foot pain, soft tissue mass of left third toe US IMPRESSION: Focal tenosynovitis of an extensor tendon in the dorsal foot, with adjacent soft tissue swelling.    PATIENT SURVEYS:  FOTO 77 primary measure with goal of 50% on 12th visit 01/07/23:  42%   SCREENING FOR RED FLAGS:  Neg  COGNITION: Overall cognitive status: Within functional limits for tasks assessed     SENSATION: WFL Occasional P&N bilateral hips when back pain is increased  MUSCLE LENGTH: Hamstrings: Right 80 deg; Left 85 deg   POSTURE: rounded shoulders, anterior pelvic tilt, and flexed trunk   PALPATION: TTP moderately sensitive through lower lumbar and sacral area    LUMBAR ROM:   AROM eval  Flexion Finger tips to knees  Extension 25%  Right lateral flexion 10%  Left lateral flexion 25%  Right rotation 50%  Left rotation 50%   All movements limited by PAIN  LOWER EXTREMITY ROM:     WFL  LOWER EXTREMITY MMT:        P!=Pain MMT Right eval Left eval  Hip flexion 3+P! 3+P! Tested supine  Hip extension 3+P! 3+P!  Hip abduction 5 5  Hip adduction 5 5  Hip internal rotation    Hip external rotation    Knee flexion 5 5  Knee extension    Ankle dorsiflexion 5 5  Ankle plantarflexion    Ankle inversion    Ankle eversion     (Blank rows = not tested)  LUMBAR SPECIAL TESTS:  Straight leg raise test: Positive, Slump test: Negative, and Thomas test: Negative  FUNCTIONAL TESTS:  30s sit to stand: 2 Timed up and go (TUG): 20.49 Berg Balance Scale: 26/56  GAIT: Distance walked: 400 Assistive device utilized: None Level of assistance:  Indep  Comments: forward hip flex, dragging heels, no arm swing, slowed cadence and shortened step length  TODAY'S  TREATMENT:  Pt seen for aquatic therapy today.  Treatment took place in water 3.5-4.5 ft in depth at the [facility]. Temp of water was 92.  Pt entered/exited the pool via stairs using step to pattern with hand  rail.  *Walking forward, backwards unsupported 4 ft, cues for vertical trunk *KB row wide stance 2 x 15 s intervals rapid pace. (Pain wrists) * side stepping with rainbow hand floats x 2 laps * TrA set with solid noodle with 4 sec return to surface wide stance then staggered * gentle trunk rotation with hands resting on board  *Standing core strengthening, hip/ankle strategy training  - hand bells varying stance: oscillations frontal and sagittal planes fast and slow.  (Did not tolerate sagittal plane well, increased back discomfort) * forward step ups with RUE on wall x 10 L/x 10 R *  hip hinge   Pt requires the buoyancy and hydrostatic pressure of water for support, and to offload joints by unweighting joint load by at least 50 % in navel deep water and by at least 75-80% in chest to neck deep water.  Viscosity of the water is needed for resistance of strengthening. Water current perturbations provides challenge to standing balance requiring increased core activation.   PATIENT EDUCATION:  Education details: aquatic progressions and modifications - issued laminated HEP Person educated: Patient Education method: Customer service manager Education comprehension: verbalized understanding  HOME EXERCISE PROGRAM: Access Code: [place] URL: [email or web address] Date: 01/07/2023 Prepared by: [place]  Exercises - Walking March  - 1 x daily - 7 x weekly - Side Stepping with Hand Floats  - 1 x daily - 7 x weekly - Seated Straddle on Flotation Forward Breast Stroke Arms and Bicycle Legs  - 1 x  daily - 7 x weekly - 3 sets - 10 reps - Shoulder Extension with Resistance  - 1 x daily - 7 x weekly - 1-2 sets - 10 reps - Split Stance Shoulder Row with Resistance  - 1 x daily - 7 x weekly - 1-2 sets - 10 reps - Standing Hip Abduction  - 1 x daily - 7 x weekly - 1-2 sets - 10 reps - Standing Hamstring Stretch on Chair  - 2 x daily - 7 x weekly - 2 reps - 20 seconds  hold - Standing 'L' Stretch at [employer]  - 1 x daily - 7 x weekly - 2 reps  ASSESSMENT:  CLINICAL IMPRESSION: Pt with successful injection into LB yesterday.  She will be having an ablation once scheduled.  Pain today low. Focused on core strength.  She tolerates well. Added use of hand bells resulting in good core muscle firing. Cus and demonstration needed for execution. Goals ongoing      OBJECTIVE IMPAIRMENTS: Abnormal gait, decreased activity tolerance, decreased balance, decreased endurance, decreased knowledge of use of DME, decreased mobility, difficulty walking, decreased ROM, decreased strength, obesity, and pain.   ACTIVITY LIMITATIONS: carrying, lifting, bending, sitting, standing, squatting, sleeping, stairs, transfers, bed mobility, and locomotion level  PARTICIPATION LIMITATIONS: cleaning, laundry, shopping, community activity, and yard work  PERSONAL FACTORS: Fitness, Past/current experiences, and 1-2 comorbidities: HTN, ankle dysfunction  are also affecting patient's functional outcome.   REHAB POTENTIAL: Good  CLINICAL DECISION MAKING: Evolving/moderate complexity  EVALUATION COMPLEXITY: Moderate   GOALS: Goals reviewed with patient? Yes  SHORT TERM GOALS: Target date: 01/17/23  Pt will tolerate full aquatic sessions consistently without increase in pain and with improving function to demonstrate good  toleration and effectiveness of intervention.   Baseline: Goal status: INITIAL  2.  Pt will improve on 30S sit to stand test to >/= 7 (1/2 of norm value) to demonstrate improving mobility and  strength Baseline: 2 Goal status: INITIAL  3.  Pt will improve on lumbar ROM by 50% in all areas with decreasing pain Baseline: see chart Goal status: INITIAL  4.  Pt will report increased toleration to standing up towards 10 minutes to fix a meal. Baseline: <4 min Goal status: INITIAL  5.  Pt will report an overall decrease in max/worst pain to <6/10 to improve sleep pattern and decrease physiological responses to pain Baseline: 8/10 Goal status: INITIAL     LONG TERM GOALS: Target date: 02/21/23  Pt to meet stated Foto Goal of 50 Baseline: 37% primary Goal status: INITIAL  2.  Pt will be indep with final HEP's (land and aquatic as appropriate) for continued management of condition  Baseline:  Goal status: INITIAL  3.  Pt will improve on Berg balance test to >/= 40/56 to demonstrate a decrease in fall risk.   Baseline: 26/56 Goal status: INITIAL  4.  Pt will improve on Tug test to </=  12s to demonstrate improvement in lower extremity function, mobility and decreased fall risk.  Baseline: 20.49 Goal status: INITIAL  5.  Pt will improve on strength of bilat hips at least 1 full grade  Baseline: see chart Goal status: INITIAL  6.  Pt will be able to walk through grocery store unlimited by fatigue or pain pushing cart to demonstrate improvement in functional mobility. Baseline: >5 mins Goal status: INITIAL  PLAN:  PT FREQUENCY: 1-2x/week  PT DURATION: other: 9 weeks  PLANNED INTERVENTIONS: Therapeutic exercises, Therapeutic activity, Neuromuscular re-education, Balance training, Gait training, Patient/Family education, Self Care, Joint mobilization, Joint manipulation, Stair training, Orthotic/Fit training, DME instructions, Aquatic Therapy, Dry Needling, Electrical stimulation, Spinal manipulation, Spinal mobilization, Cryotherapy, Moist heat, scar mobilization, Splintting, Taping, Traction, Ultrasound, Ionotophoresis '4mg'$ /ml Dexamethasone, Manual therapy, and  Re-evaluation.  PLAN FOR NEXT SESSION: Aquatics: stretching and strengthening core, hips, LE. Balance and gait retraining, Posture retraining. Pain reduction.  [address] [place]) Kamelia Lampkins MPT 01/10/23 10:32 AM [facility] [address] [place], [place], [address] Phone: [phone]   Fax:  [phone]

## 2023-01-12 DIAGNOSIS — F331 Major depressive disorder, recurrent, moderate: Secondary | ICD-10-CM | POA: Diagnosis not present

## 2023-01-12 DIAGNOSIS — F411 Generalized anxiety disorder: Secondary | ICD-10-CM | POA: Diagnosis not present

## 2023-01-12 DIAGNOSIS — Z62820 Parent-biological child conflict: Secondary | ICD-10-CM | POA: Diagnosis not present

## 2023-01-12 DIAGNOSIS — R69 Illness, unspecified: Secondary | ICD-10-CM | POA: Diagnosis not present

## 2023-01-14 ENCOUNTER — Ambulatory Visit (HOSPITAL_BASED_OUTPATIENT_CLINIC_OR_DEPARTMENT_OTHER): Payer: Medicare HMO | Admitting: Physical Therapy

## 2023-01-14 ENCOUNTER — Encounter (HOSPITAL_BASED_OUTPATIENT_CLINIC_OR_DEPARTMENT_OTHER): Payer: Self-pay | Admitting: Physical Therapy

## 2023-01-14 DIAGNOSIS — R2689 Other abnormalities of gait and mobility: Secondary | ICD-10-CM

## 2023-01-14 DIAGNOSIS — M5459 Other low back pain: Secondary | ICD-10-CM | POA: Diagnosis not present

## 2023-01-14 DIAGNOSIS — M6281 Muscle weakness (generalized): Secondary | ICD-10-CM | POA: Diagnosis not present

## 2023-01-14 DIAGNOSIS — R293 Abnormal posture: Secondary | ICD-10-CM | POA: Diagnosis not present

## 2023-01-14 NOTE — Therapy (Signed)
OUTPATIENT PHYSICAL THERAPY THORACOLUMBAR TREATMENT   Patient Name: Haley White MRN: [id number] DOB:12-26-1955, 67 y.o., female Today's Date: 01/14/2023  END OF SESSION:  PT End of Session - 01/14/23 0907     Visit Number 8    Number of Visits 16    Date for PT Re-Evaluation 02/21/23    Authorization Type aetna medicare    PT Start Time 0900    PT Stop Time 0940    PT Time Calculation (min) 40 min    Activity Tolerance Patient tolerated treatment well    Behavior During Therapy WFL for tasks assessed/performed             Past Medical History:  Diagnosis Date   Anxiety    Arthritis    Asthma    Cataract    Gout    Hyperlipidemia    Hypertension    Obese    Past Surgical History:  Procedure Laterality Date   APPENDECTOMY  1984   CATARACT EXTRACTION W/ INTRAOCULAR LENS  IMPLANT, BILATERAL  2014   CESAREAN SECTION  1984, 1987, 1994   X3   COLONOSCOPY  11/16/2013   w/Brodie    FOOT SURGERY Left 2002   KNEE SURGERY Left 1998   ROTATOR CUFF REPAIR Right 2007   TUBAL LIGATION  1994   Patient Active Problem List   Diagnosis Date Noted   DOE (dyspnea on exertion) 05/10/2022   Acute pulmonary embolism (HCC) 05/10/2022   Allergic rhinitis 03/15/2021   Gastro-esophageal reflux disease without esophagitis 03/15/2021   Mild persistent asthma, uncomplicated [id number]   Aortic atherosclerosis ([place]) by Chest CT scan on 02/02/2018  01/10/2021   Asthma    Low back pain 03/17/2019   Lumbosacral spondylosis without myelopathy 03/17/2019   Cubital tunnel syndrome 10/14/2018   Closed fracture of distal end of right radius 07/09/2018   Pseudogout 12/09/2017   Major depressive disorder, recurrent, severe without psychotic features ([place])    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   Suicide attempt by drug ingestion ([place])    Medication management 08/22/2014   Vitamin D deficiency 08/22/2014   Hyperlipidemia, mixed 08/22/2014   Hypertension    Anxiety    Abnormal glucose     Class 2 severe obesity due to excess calories with serious comorbidity and body mass index (BMI) of 37.0 to 37.9 in adult [facility])     PCP: Unk Pinto, MD   REFERRING PROVIDER: Susa Day, MD   REFERRING DIAG: M54.51 (ICD-10-CM) - Vertebrogenic low back pain   Rationale for Evaluation and Treatment: Rehabilitation  THERAPY DIAG:  Other low back pain  Muscle weakness (generalized)  Other abnormalities of gait and mobility  Abnormal posture  ONSET DATE: >36yr SUBJECTIVE:  SUBJECTIVE STATEMENT: My pain is up and down, but overall my pain  has greatly decreased.  PERTINENT HISTORY:  Patient is a very nice 67 yo div WF  with   HTN, HLD, Prediabetes  and Vitamin D Deficiency and had  hx/o recent Covid PNA~ May 2023  and  and in July 2023 was discovered to have a Rt sided PE and was hospitalized for Heparin & transitioned to Eliquis ( to continue 6 months  thru Jan 7 ) .    She was seen 5-6 weeks ago for evaluation for c/o  "shaking & forgetfulness " .  She reported poor short  & long term memory recall.  (Pt discontinued meds as per MD and reports back to baseline with memory)  PAIN:  Are you having pain? Yes: NPRS scale: 5/10 Pain location: Low lumbar area across hip to hip Pain description: ache, dull  Aggravating factors: standing <4 mins; walking >5 mins Relieving factors: Aspen lumbar brace; tylenol, ms relaxer  PRECAUTIONS: Back and Fall  WEIGHT BEARING RESTRICTIONS: No  FALLS:  Has patient fallen in last 6 months? No  LIVING ENVIRONMENT: Lives with: lives alone Lives in: House/apartment Stairs: Yes: External: 3 steps; none Has following equipment at home: Single point cane  OCCUPATION: retired  PLOF: Independent  PATIENT GOALS: less pain, be able to stand and walk  longer, get strength back  NEXT MD VISIT:  OBJECTIVE:   DIAGNOSTIC FINDINGS:  Pt reports recent MRI at [place] but no results in [place]: foot pain, soft tissue mass of left third toe US IMPRESSION: Focal tenosynovitis of an extensor tendon in the dorsal foot, with adjacent soft tissue swelling.    PATIENT SURVEYS:  FOTO 73 primary measure with goal of 50% on 12th visit 01/07/23:  42%   SCREENING FOR RED FLAGS:  Neg  COGNITION: Overall cognitive status: Within functional limits for tasks assessed     SENSATION: WFL Occasional P&N bilateral hips when back pain is increased  MUSCLE LENGTH: Hamstrings: Right 80 deg; Left 85 deg   POSTURE: rounded shoulders, anterior pelvic tilt, and flexed trunk   PALPATION: TTP moderately sensitive through lower lumbar and sacral area    LUMBAR ROM:   AROM eval  Flexion Finger tips to knees  Extension 25%  Right lateral flexion 10%  Left lateral flexion 25%  Right rotation 50%  Left rotation 50%   All movements limited by PAIN  LOWER EXTREMITY ROM:     WFL  LOWER EXTREMITY MMT:        P!=Pain MMT Right eval Left eval  Hip flexion 3+P! 3+P! Tested supine  Hip extension 3+P! 3+P!  Hip abduction 5 5  Hip adduction 5 5  Hip internal rotation    Hip external rotation    Knee flexion 5 5  Knee extension    Ankle dorsiflexion 5 5  Ankle plantarflexion    Ankle inversion    Ankle eversion     (Blank rows = not tested)  LUMBAR SPECIAL TESTS:  Straight leg raise test: Positive, Slump test: Negative, and Thomas test: Negative  FUNCTIONAL TESTS:  30s sit to stand: 2 Timed up and go (TUG): 20.49 Berg Balance Scale: 26/56  GAIT: Distance walked: 400 Assistive device utilized: None Level of assistance:  Indep  Comments: forward hip flex, dragging heels, no arm swing, slowed cadence and shortened step length  TODAY'S TREATMENT:  Pt seen for aquatic therapy today.  Treatment took place in water 3.5-4.5 ft in depth at the [facility]. Temp of water was 92.  Pt entered/exited the pool via stairs using step to pattern with hand  rail.  *Walking forward, backwards, side stepping unsupported 4 ft * side stepping with arm addct with yellow hand floats;  side step into squat moving L/R with arm addct yellow floats * single yellow hand at side walking backwards/forwards for increased core activation; bilat hand floats at side forward/backward * staggered stance with tricep push down  * wide stance with bilat shoulder horiz abdct/ addct with rainbow handfloats, with varied speeds * holding wall:  leg swings in sagittal plane (to toe touch in ext) x 10 each;  in frontal plane crossing midline x 10; repeated with increased speed and decreased range * L stretch * slow walking forward/ backwards with reciprocal arm swing with resistance bells x 2 laps * holding yellow hand floats : tandem gait forward backwards x 2 laps  *Lt forward step ups with RUE on wall x 10 * STS at bench in water, blue step under feet x 10 * straddling noodle and cycling in deeper water (varied speed)    Pt requires the buoyancy and hydrostatic pressure of water for support, and to offload joints by unweighting joint load by at least 50 % in navel deep water and by at least 75-80% in chest to neck deep water.  Viscosity of the water is needed for resistance of strengthening. Water current perturbations provides challenge to standing balance requiring increased core activation.   PATIENT EDUCATION:  Education details: aquatic progressions and modifications  Person educated: Patient Education method: Customer service manager Education comprehension: verbalized understanding  HOME EXERCISE PROGRAM: Access Code: [place] URL: [email or web address] Date: 01/07/2023 Prepared  by: [place]  Exercises - Walking March  - 1 x daily - 7 x weekly - Side Stepping with Hand Floats  - 1 x daily - 7 x weekly - Seated Straddle on Flotation Forward Breast Stroke Arms and Bicycle Legs  - 1 x daily - 7 x weekly - 3 sets - 10 reps - Shoulder Extension with Resistance  - 1 x daily - 7 x weekly - 1-2 sets - 10 reps - Split Stance Shoulder Row with Resistance  - 1 x daily - 7 x weekly - 1-2 sets - 10 reps - Standing Hip Abduction  - 1 x daily - 7 x weekly - 1-2 sets - 10 reps - Standing Hamstring Stretch on Chair  - 2 x daily - 7 x weekly - 2 reps - 20 seconds  hold - Standing 'L' Stretch at [employer]  - 1 x daily - 7 x weekly - 2 reps  ASSESSMENT:  CLINICAL IMPRESSION: Focused on core strength and overall endurance of standing mobility.  She is given minor cues for trunk position, as she sometimes overextends back when searching for neutral. She complains of increased back pain with hip ext and continues to get relief with forward flexion at hips. Pt progressing gradually towards all rehab goals.  Therapist to check STG at next visit.       OBJECTIVE IMPAIRMENTS: Abnormal gait, decreased activity tolerance, decreased balance, decreased endurance, decreased knowledge of use of DME, decreased mobility, difficulty walking, decreased ROM, decreased strength, obesity, and pain.   ACTIVITY LIMITATIONS: carrying, lifting, bending, sitting, standing, squatting, sleeping, stairs, transfers, bed mobility, and locomotion level  PARTICIPATION LIMITATIONS: cleaning,  laundry, shopping, community activity, and yard work  PERSONAL FACTORS: Fitness, Past/current experiences, and 1-2 comorbidities: HTN, ankle dysfunction  are also affecting patient's functional outcome.   REHAB POTENTIAL: Good  CLINICAL DECISION MAKING: Evolving/moderate complexity  EVALUATION COMPLEXITY: Moderate   GOALS: Goals reviewed with patient? Yes  SHORT TERM GOALS: Target date:  01/17/23  Pt will tolerate full aquatic sessions consistently without increase in pain and with improving function to demonstrate good toleration and effectiveness of intervention.   Baseline: Goal status: INITIAL  2.  Pt will improve on 30S sit to stand test to >/= 7 (1/2 of norm value) to demonstrate improving mobility and strength Baseline: 2 Goal status: INITIAL  3.  Pt will improve on lumbar ROM by 50% in all areas with decreasing pain Baseline: see chart Goal status: INITIAL  4.  Pt will report increased toleration to standing up towards 10 minutes to fix a meal. Baseline: <4 min Goal status: INITIAL  5.  Pt will report an overall decrease in max/worst pain to <6/10 to improve sleep pattern and decrease physiological responses to pain Baseline: 8/10 Goal status: INITIAL     LONG TERM GOALS: Target date: 02/21/23  Pt to meet stated Foto Goal of 50 Baseline: 37% primary Goal status: INITIAL  2.  Pt will be indep with final HEP's (land and aquatic as appropriate) for continued management of condition  Baseline:  Goal status: INITIAL  3.  Pt will improve on Berg balance test to >/= 40/56 to demonstrate a decrease in fall risk.   Baseline: 26/56 Goal status: INITIAL  4.  Pt will improve on Tug test to </=  12s to demonstrate improvement in lower extremity function, mobility and decreased fall risk.  Baseline: 20.49 Goal status: INITIAL  5.  Pt will improve on strength of bilat hips at least 1 full grade  Baseline: see chart Goal status: INITIAL  6.  Pt will be able to walk through grocery store unlimited by fatigue or pain pushing cart to demonstrate improvement in functional mobility. Baseline: >5 mins Goal status: INITIAL  PLAN:  PT FREQUENCY: 1-2x/week  PT DURATION: other: 9 weeks  PLANNED INTERVENTIONS: Therapeutic exercises, Therapeutic activity, Neuromuscular re-education, Balance training, Gait training, Patient/Family education, Self Care, Joint  mobilization, Joint manipulation, Stair training, Orthotic/Fit training, DME instructions, Aquatic Therapy, Dry Needling, Electrical stimulation, Spinal manipulation, Spinal mobilization, Cryotherapy, Moist heat, scar mobilization, Splintting, Taping, Traction, Ultrasound, Ionotophoresis '4mg'$ /ml Dexamethasone, Manual therapy, and Re-evaluation.  PLAN FOR NEXT SESSION: Aquatics: stretching and strengthening core, hips, LE. Balance and gait retraining, Posture retraining. Pain reduction.  Kerin Perna, PTA 01/14/23 9:48 AM [facility] [address] [place], [place], [address] Phone: [phone]   Fax:  [phone]

## 2023-01-17 ENCOUNTER — Encounter (HOSPITAL_BASED_OUTPATIENT_CLINIC_OR_DEPARTMENT_OTHER): Payer: Self-pay | Admitting: Physical Therapy

## 2023-01-17 ENCOUNTER — Ambulatory Visit (HOSPITAL_BASED_OUTPATIENT_CLINIC_OR_DEPARTMENT_OTHER): Payer: Medicare HMO | Admitting: Physical Therapy

## 2023-01-17 DIAGNOSIS — R2689 Other abnormalities of gait and mobility: Secondary | ICD-10-CM

## 2023-01-17 DIAGNOSIS — R293 Abnormal posture: Secondary | ICD-10-CM | POA: Diagnosis not present

## 2023-01-17 DIAGNOSIS — M5459 Other low back pain: Secondary | ICD-10-CM

## 2023-01-17 DIAGNOSIS — J3089 Other allergic rhinitis: Secondary | ICD-10-CM | POA: Diagnosis not present

## 2023-01-17 DIAGNOSIS — M6281 Muscle weakness (generalized): Secondary | ICD-10-CM

## 2023-01-17 DIAGNOSIS — J301 Allergic rhinitis due to pollen: Secondary | ICD-10-CM | POA: Diagnosis not present

## 2023-01-17 NOTE — Therapy (Signed)
OUTPATIENT PHYSICAL THERAPY THORACOLUMBAR TREATMENT   Patient Name: Haley White MRN: [id number] DOB:January 04, 1956, 67 y.o., female Today's Date: 01/17/2023  END OF SESSION:  PT End of Session - 01/17/23 1306     Visit Number 9    Number of Visits 16    Date for PT Re-Evaluation 02/21/23    Authorization Type aetna medicare    PT Start Time 1031    PT Stop Time 1115    PT Time Calculation (min) 44 min    Activity Tolerance Patient tolerated treatment well    Behavior During Therapy WFL for tasks assessed/performed              Past Medical History:  Diagnosis Date   Anxiety    Arthritis    Asthma    Cataract    Gout    Hyperlipidemia    Hypertension    Obese    Past Surgical History:  Procedure Laterality Date   APPENDECTOMY  1984   CATARACT EXTRACTION W/ INTRAOCULAR LENS  IMPLANT, BILATERAL  2014   CESAREAN SECTION  1984, 1987, 1994   X3   COLONOSCOPY  11/16/2013   w/Brodie    FOOT SURGERY Left 2002   KNEE SURGERY Left 1998   ROTATOR CUFF REPAIR Right 2007   TUBAL LIGATION  1994   Patient Active Problem List   Diagnosis Date Noted   DOE (dyspnea on exertion) 05/10/2022   Acute pulmonary embolism ([place]) 05/10/2022   Allergic rhinitis 03/15/2021   Gastro-esophageal reflux disease without esophagitis 03/15/2021   Mild persistent asthma, uncomplicated [id number]   Aortic atherosclerosis ([place]) by Chest CT scan on 02/02/2018  01/10/2021   Asthma    Low back pain 03/17/2019   Lumbosacral spondylosis without myelopathy 03/17/2019   Cubital tunnel syndrome 10/14/2018   Closed fracture of distal end of right radius 07/09/2018   Pseudogout 12/09/2017   Major depressive disorder, recurrent, severe without psychotic features ([place])    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   Suicide attempt by drug ingestion ([place])    Medication management 08/22/2014   Vitamin D deficiency 08/22/2014   Hyperlipidemia, mixed 08/22/2014   Hypertension    Anxiety    Abnormal glucose     Class 2 severe obesity due to excess calories with serious comorbidity and body mass index (BMI) of 37.0 to 37.9 in adult [facility])     PCP: Unk Pinto, MD   REFERRING PROVIDER: Susa Day, MD   REFERRING DIAG: M54.51 (ICD-10-CM) - Vertebrogenic low back pain   Rationale for Evaluation and Treatment: Rehabilitation  THERAPY DIAG:  Other low back pain  Muscle weakness (generalized)  Other abnormalities of gait and mobility  ONSET DATE: >15yr  SUBJECTIVE:  SUBJECTIVE STATEMENT: "Its going to rain today, back pain is bad"  PERTINENT HISTORY:  Patient is a very nice 67 yo div WF  with   HTN, HLD, Prediabetes  and Vitamin D Deficiency and had  hx/o recent Covid PNA~ May 2023  and  and in July 2023 was discovered to have a Rt sided PE and was hospitalized for Heparin & transitioned to Eliquis ( to continue 6 months  thru Jan 7 ) .    She was seen 5-6 weeks ago for evaluation for c/o  "shaking & forgetfulness " .  She reported poor short  & long term memory recall.  (Pt discontinued meds as per MD and reports back to baseline with memory)  PAIN:  Are you having pain? Yes: NPRS scale: 6/10 Pain location: Low lumbar area across hip to hip Pain description: ache, dull  Aggravating factors: standing <4 mins; walking >5 mins Relieving factors: Aspen lumbar brace; tylenol, ms relaxer  PRECAUTIONS: Back and Fall  WEIGHT BEARING RESTRICTIONS: No  FALLS:  Has patient fallen in last 6 months? No  LIVING ENVIRONMENT: Lives with: lives alone Lives in: House/apartment Stairs: Yes: External: 3 steps; none Has following equipment at home: Single point cane  OCCUPATION: retired  PLOF: Independent  PATIENT GOALS: less pain, be able to stand and walk longer, get strength back  NEXT MD  VISIT:  OBJECTIVE:   DIAGNOSTIC FINDINGS:  Pt reports recent MRI at [place] but no results in [place]: foot pain, soft tissue mass of left third toe US IMPRESSION: Focal tenosynovitis of an extensor tendon in the dorsal foot, with adjacent soft tissue swelling.    PATIENT SURVEYS:  FOTO 19 primary measure with goal of 50% on 12th visit 01/07/23:  42%   SCREENING FOR RED FLAGS:  Neg  COGNITION: Overall cognitive status: Within functional limits for tasks assessed     SENSATION: WFL Occasional P&N bilateral hips when back pain is increased  MUSCLE LENGTH: Hamstrings: Right 80 deg; Left 85 deg   POSTURE: rounded shoulders, anterior pelvic tilt, and flexed trunk   PALPATION: TTP moderately sensitive through lower lumbar and sacral area    LUMBAR ROM:   AROM eval  Flexion Finger tips to knees  Extension 25%  Right lateral flexion 10%  Left lateral flexion 25%  Right rotation 50%  Left rotation 50%   All movements limited by PAIN  LOWER EXTREMITY ROM:     WFL  LOWER EXTREMITY MMT:        P!=Pain MMT Right eval Left eval  Hip flexion 3+P! 3+P! Tested supine  Hip extension 3+P! 3+P!  Hip abduction 5 5  Hip adduction 5 5  Hip internal rotation    Hip external rotation    Knee flexion 5 5  Knee extension    Ankle dorsiflexion 5 5  Ankle plantarflexion    Ankle inversion    Ankle eversion     (Blank rows = not tested)  LUMBAR SPECIAL TESTS:  Straight leg raise test: Positive, Slump test: Negative, and Thomas test: Negative  FUNCTIONAL TESTS:  30s sit to stand: 2 Timed up and go (TUG): 20.49 Berg Balance Scale: 26/56  GAIT: Distance walked: 400 Assistive device utilized: None Level of assistance:  Indep  Comments: forward hip flex, dragging heels, no arm swing, slowed cadence and shortened step length  TODAY'S TREATMENT:  Pt seen for aquatic therapy today.  Treatment took place in water 3.5-4.5 ft in depth at the [facility]. Temp of water was 92.  Pt entered/exited the pool via stairs using step to pattern with hand  rail.  *Walking forward, backwards, side stepping unsupported 4 ft * side stepping with arm addct with yellow hand floats;  side step into squat moving L/R with arm addct yellow floats * L stretch with tail wagging * rainbow HB  at side walking backwards/forwards for increased core activation;  * staggered stance with tricep push down  * straddling noodle and cycling in deeper water  *Lt forward step ups with RUE on wall x 10 *noodle pull down for core engagement wide stance x 10 then repeated with staggered stance *Lumbar rotation * wide stance with bilat shoulder horiz abdct/ addct with rainbow handfloats, with varied speeds * holding wall: using hand bells oscillation in sagittal plane and frontal plane crossing midline keeping slow rhythmic movements core stabilized * slow walking forward/ backwards with reciprocal arm swing with resistance bells x 4 laps  Pt requires the buoyancy and hydrostatic pressure of water for support, and to offload joints by unweighting joint load by at least 50 % in navel deep water and by at least 75-80% in chest to neck deep water.  Viscosity of the water is needed for resistance of strengthening. Water current perturbations provides challenge to standing balance requiring increased core activation.   PATIENT EDUCATION:  Education details: aquatic progressions and modifications  Person educated: Patient Education method: Customer service manager Education comprehension: verbalized understanding  HOME EXERCISE PROGRAM: Access Code: [place] URL: [email or web address] Date: 01/07/2023 Prepared by: [place]  Exercises - Walking March  - 1 x daily - 7 x weekly - Side Stepping  with Hand Floats  - 1 x daily - 7 x weekly - Seated Straddle on Flotation Forward Breast Stroke Arms and Bicycle Legs  - 1 x daily - 7 x weekly - 3 sets - 10 reps - Shoulder Extension with Resistance  - 1 x daily - 7 x weekly - 1-2 sets - 10 reps - Split Stance Shoulder Row with Resistance  - 1 x daily - 7 x weekly - 1-2 sets - 10 reps - Standing Hip Abduction  - 1 x daily - 7 x weekly - 1-2 sets - 10 reps - Standing Hamstring Stretch on Chair  - 2 x daily - 7 x weekly - 2 reps - 20 seconds  hold - Standing 'L' Stretch at [employer]  - 1 x daily - 7 x weekly - 2 reps  ASSESSMENT:  CLINICAL IMPRESSION: Increased LBP today. Initial focus to decrease through stretching and decompression. Pt requires multiple VC for decreased intensity as she tends to put forth grand effort with each task causing increased incidences of muscle cramping. She vu.  Reduction in pain gained by end of session. 10 visit progress note to be completed next session.      OBJECTIVE IMPAIRMENTS: Abnormal gait, decreased activity tolerance, decreased balance, decreased endurance, decreased knowledge of use of DME, decreased mobility, difficulty walking, decreased ROM, decreased strength, obesity, and pain.   ACTIVITY LIMITATIONS: carrying, lifting, bending, sitting, standing, squatting, sleeping, stairs, transfers, bed mobility, and locomotion level  PARTICIPATION LIMITATIONS: cleaning, laundry, shopping, community activity, and yard work  PERSONAL FACTORS: Fitness, Past/current experiences, and 1-2 comorbidities: HTN, ankle dysfunction  are also affecting patient's functional outcome.   REHAB POTENTIAL: Good  CLINICAL  DECISION MAKING: Evolving/moderate complexity  EVALUATION COMPLEXITY: Moderate   GOALS: Goals reviewed with patient? Yes  SHORT TERM GOALS: Target date: 01/17/23  Pt will tolerate full aquatic sessions consistently without increase in pain and with improving function to demonstrate good toleration and  effectiveness of intervention.   Baseline: Goal status: Met 01/17/23  2.  Pt will improve on 30S sit to stand test to >/= 7 (1/2 of norm value) to demonstrate improving mobility and strength Baseline: 2 Goal status: INITIAL  3.  Pt will improve on lumbar ROM by 50% in all areas with decreasing pain Baseline: see chart Goal status: INITIAL  4.  Pt will report increased toleration to standing up towards 10 minutes to fix a meal. Baseline: <4 min Goal status: INITIAL  5.  Pt will report an overall decrease in max/worst pain to <6/10 to improve sleep pattern and decrease physiological responses to pain Baseline: 8/10 Goal status: INITIAL     LONG TERM GOALS: Target date: 02/21/23  Pt to meet stated Foto Goal of 50 Baseline: 37% primary Goal status: INITIAL  2.  Pt will be indep with final HEP's (land and aquatic as appropriate) for continued management of condition  Baseline:  Goal status: INITIAL  3.  Pt will improve on Berg balance test to >/= 40/56 to demonstrate a decrease in fall risk.   Baseline: 26/56 Goal status: INITIAL  4.  Pt will improve on Tug test to </=  12s to demonstrate improvement in lower extremity function, mobility and decreased fall risk.  Baseline: 20.49 Goal status: INITIAL  5.  Pt will improve on strength of bilat hips at least 1 full grade  Baseline: see chart Goal status: INITIAL  6.  Pt will be able to walk through grocery store unlimited by fatigue or pain pushing cart to demonstrate improvement in functional mobility. Baseline: >5 mins Goal status: INITIAL  PLAN:  PT FREQUENCY: 1-2x/week  PT DURATION: other: 9 weeks  PLANNED INTERVENTIONS: Therapeutic exercises, Therapeutic activity, Neuromuscular re-education, Balance training, Gait training, Patient/Family education, Self Care, Joint mobilization, Joint manipulation, Stair training, Orthotic/Fit training, DME instructions, Aquatic Therapy, Dry Needling, Electrical stimulation, Spinal  manipulation, Spinal mobilization, Cryotherapy, Moist heat, scar mobilization, Splintting, Taping, Traction, Ultrasound, Ionotophoresis 4mg /ml Dexamethasone, Manual therapy, and Re-evaluation.  PLAN FOR NEXT SESSION: Aquatics: stretching and strengthening core, hips, LE. Balance and gait retraining, Posture retraining. Pain reduction.  [place]) Maud Rubendall MPT 01/17/23 1:07 PM [facility] [address] [place], [place], [address] Phone: [phone]   Fax:  [phone]

## 2023-01-21 ENCOUNTER — Ambulatory Visit (HOSPITAL_BASED_OUTPATIENT_CLINIC_OR_DEPARTMENT_OTHER): Payer: Medicare HMO | Admitting: Physical Therapy

## 2023-01-21 ENCOUNTER — Encounter (HOSPITAL_BASED_OUTPATIENT_CLINIC_OR_DEPARTMENT_OTHER): Payer: Self-pay | Admitting: Physical Therapy

## 2023-01-21 DIAGNOSIS — R293 Abnormal posture: Secondary | ICD-10-CM | POA: Diagnosis not present

## 2023-01-21 DIAGNOSIS — M6281 Muscle weakness (generalized): Secondary | ICD-10-CM

## 2023-01-21 DIAGNOSIS — M5459 Other low back pain: Secondary | ICD-10-CM

## 2023-01-21 DIAGNOSIS — R2689 Other abnormalities of gait and mobility: Secondary | ICD-10-CM

## 2023-01-21 NOTE — Therapy (Signed)
OUTPATIENT PHYSICAL THERAPY THORACOLUMBAR TREATMENT Progress Note Reporting Period 12/20/22 to 01/21/23  See note below for Objective Data and Assessment of Progress/Goals.      Patient Name: Haley White MRN: [id number] DOB:[birth year], 67 y.o., female Today's Date: 01/21/2023  END OF SESSION:  PT End of Session - 01/21/23 1313     Visit Number 10    Number of Visits 16    Date for PT Re-Evaluation 02/21/23    Authorization Type aetna medicare    Progress Note Due on Visit 37    PT Start Time 0903    PT Stop Time 0945    PT Time Calculation (min) 42 min    Activity Tolerance Patient tolerated treatment well    Behavior During Therapy WFL for tasks assessed/performed              Past Medical History:  Diagnosis Date   Anxiety    Arthritis    Asthma    Cataract    Gout    Hyperlipidemia    Hypertension    Obese    Past Surgical History:  Procedure Laterality Date   APPENDECTOMY  1984   CATARACT EXTRACTION W/ INTRAOCULAR LENS  IMPLANT, BILATERAL  2014   CESAREAN SECTION  1984, 1987, 1994   X3   COLONOSCOPY  11/16/2013   w/[place]    FOOT SURGERY Left 2002   KNEE SURGERY Left 1998   ROTATOR CUFF REPAIR Right 2007   TUBAL LIGATION  1994   Patient Active Problem List   Diagnosis Date Noted   DOE (dyspnea on exertion) 05/10/2022   Acute pulmonary embolism (HCC) 05/10/2022   Allergic rhinitis 03/15/2021   Gastro-esophageal reflux disease without esophagitis 03/15/2021   Mild persistent asthma, uncomplicated [id number]   Aortic atherosclerosis ([place]) by Chest CT scan on 02/02/2018  01/10/2021   Asthma    Low back pain 03/17/2019   Lumbosacral spondylosis without myelopathy 03/17/2019   Cubital tunnel syndrome 10/14/2018   Closed fracture of distal end of right radius 07/09/2018   Pseudogout 12/09/2017   Major depressive disorder, recurrent, severe without psychotic features ([place])    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   Suicide attempt by drug ingestion  ([place])    Medication management 08/22/2014   Vitamin D deficiency 08/22/2014   Hyperlipidemia, mixed 08/22/2014   Hypertension    Anxiety    Abnormal glucose    Class 2 severe obesity due to excess calories with serious comorbidity and body mass index (BMI) of 37.0 to 37.9 in adult [facility])     PCP: Unk Pinto, MD   REFERRING PROVIDER: Susa Day, MD   REFERRING DIAG: M54.51 (ICD-10-CM) - Vertebrogenic low back pain   Rationale for Evaluation and Treatment: Rehabilitation  THERAPY DIAG:  Other low back pain  Muscle weakness (generalized)  Other abnormalities of gait and mobility  ONSET DATE: >72yr  SUBJECTIVE:  SUBJECTIVE STATEMENT: "Back is still bad not getting much relief.  Have ablation scheduled for April2"  PERTINENT HISTORY:  Patient is a very nice 67 yo div WF  with   HTN, HLD, Prediabetes  and Vitamin D Deficiency and had  hx/o recent Covid PNA~ May 2023  and  and in July 2023 was discovered to have a Rt sided PE and was hospitalized for Heparin & transitioned to Eliquis ( to continue 6 months  thru Jan 7 ) .    She was seen 5-6 weeks ago for evaluation for c/o  "shaking & forgetfulness " .  She reported poor short  & long term memory recall.  (Pt discontinued meds as per MD and reports back to baseline with memory)  PAIN:  Are you having pain? Yes: NPRS scale: 6/10 Pain location: Low lumbar area across hip to hip Pain description: ache, dull  Aggravating factors: standing <4 mins; walking >5 mins Relieving factors: Aspen lumbar brace; tylenol, ms relaxer  PRECAUTIONS: Back and Fall  WEIGHT BEARING RESTRICTIONS: No  FALLS:  Has patient fallen in last 6 months? No  LIVING ENVIRONMENT: Lives with: lives alone Lives in: House/apartment Stairs: Yes: External: 3 steps;  none Has following equipment at home: Single point cane  OCCUPATION: retired  PLOF: Independent  PATIENT GOALS: less pain, be able to stand and walk longer, get strength back  NEXT MD VISIT:  OBJECTIVE:   DIAGNOSTIC FINDINGS:  Pt reports recent MRI at [place] but no results in [place]: foot pain, soft tissue mass of left third toe US IMPRESSION: Focal tenosynovitis of an extensor tendon in the dorsal foot, with adjacent soft tissue swelling.    PATIENT SURVEYS:  FOTO 37 primary measure with goal of 50% on 12th visit 01/07/23:  42%   SCREENING FOR RED FLAGS:  Neg  COGNITION: Overall cognitive status: Within functional limits for tasks assessed     SENSATION: WFL Occasional P&N bilateral hips when back pain is increased  MUSCLE LENGTH: Hamstrings: Right 80 deg; Left 85 deg   POSTURE: rounded shoulders, anterior pelvic tilt, and flexed trunk   PALPATION: TTP moderately sensitive through lower lumbar and sacral area    LUMBAR ROM:   AROM eval 01/21/23  Flexion Finger tips to knees To knees  Extension 25% 50%  Right lateral flexion 10% 25%  Left lateral flexion 25% 50%  Right rotation 50% 50%  Left rotation 50% 50%   All movements limited by PAIN  LOWER EXTREMITY ROM:     WFL  LOWER EXTREMITY MMT:        P!=Pain MMT Right eval Left eval  Hip flexion 3+P! 3+P! Tested supine  Hip extension 3+P! 3+P!  Hip abduction 5 5  Hip adduction 5 5  Hip internal rotation    Hip external rotation    Knee flexion 5 5  Knee extension    Ankle dorsiflexion 5 5  Ankle plantarflexion    Ankle inversion    Ankle eversion     (Blank rows = not tested)  LUMBAR SPECIAL TESTS:  Straight leg raise test: Positive, Slump test: Negative, and Thomas test: Negative  FUNCTIONAL TESTS:  30s sit to stand: 2 Timed up and go (TUG): 20.49 Berg Balance Scale: 26/56  GAIT: Distance walked: 400 Assistive device utilized: None Level of assistance:  Indep   Comments: forward hip flex, dragging heels, no arm swing, slowed cadence and shortened step length  TODAY'S TREATMENT:  Pt seen for aquatic therapy today.  Treatment took place in water 3.5-4.5 ft in depth at the [facility]. Temp of water was 92.  Pt entered/exited the pool via stairs using step to pattern with hand  rail.  *Walking forward, backwards, side stepping unsupported 4 ft * side stepping with arm addct with yellow hand floats;  side step into squat moving L/R with arm addct yellow floats * L stretch with tail wagging *plank on bench with hip extension x10 * holding wall: using hand bells oscillation in sagittal plane and frontal plane crossing midline keeping slow rhythmic movements core stabilized *Lumbar rotation  Pt requires the buoyancy and hydrostatic pressure of water for support, and to offload joints by unweighting joint load by at least 50 % in navel deep water and by at least 75-80% in chest to neck deep water.  Viscosity of the water is needed for resistance of strengthening. Water current perturbations provides challenge to standing balance requiring increased core activation.   PATIENT EDUCATION:  Education details: aquatic progressions and modifications  Person educated: Patient Education method: Customer service manager Education comprehension: verbalized understanding  HOME EXERCISE PROGRAM: Access Code: [place] URL: [email or web address] Date: 01/07/2023 Prepared by: [place]  Exercises - Walking March  - 1 x daily - 7 x weekly - Side Stepping with Hand Floats  - 1 x daily - 7 x weekly - Seated Straddle on Flotation Forward Breast Stroke Arms and Bicycle Legs  - 1 x daily - 7 x weekly - 3 sets - 10 reps - Shoulder Extension with Resistance  - 1 x daily - 7 x weekly  - 1-2 sets - 10 reps - Split Stance Shoulder Row with Resistance  - 1 x daily - 7 x weekly - 1-2 sets - 10 reps - Standing Hip Abduction  - 1 x daily - 7 x weekly - 1-2 sets - 10 reps - Standing Hamstring Stretch on Chair  - 2 x daily - 7 x weekly - 2 reps - 20 seconds  hold - Standing 'L' Stretch at Counter  - 1 x daily - 7 x weekly - 2 reps  ASSESSMENT:  CLINICAL IMPRESSION: PN: pt has had intermittent improvement in functional mobility and pain throughout episode. Her overall pain symptoms have been at a lower intensity and she has had some ROM gains.  She will be having an ablation in 2 weeks. Pt declined functional testing today due to increased pain last few days. She has decided to put PT on hold until after procedure. Will re-evaluate at that time.       OBJECTIVE IMPAIRMENTS: Abnormal gait, decreased activity tolerance, decreased balance, decreased endurance, decreased knowledge of use of DME, decreased mobility, difficulty walking, decreased ROM, decreased strength, obesity, and pain.   ACTIVITY LIMITATIONS: carrying, lifting, bending, sitting, standing, squatting, sleeping, stairs, transfers, bed mobility, and locomotion level  PARTICIPATION LIMITATIONS: cleaning, laundry, shopping, community activity, and yard work  PERSONAL FACTORS: Fitness, Past/current experiences, and 1-2 comorbidities: HTN, ankle dysfunction  are also affecting patient's functional outcome.   REHAB POTENTIAL: Good  CLINICAL DECISION MAKING: Evolving/moderate complexity  EVALUATION COMPLEXITY: Moderate   GOALS: Goals reviewed with patient? Yes  SHORT TERM GOALS: Target date: 01/17/23  Pt will tolerate full aquatic sessions consistently without increase in pain and with improving function to demonstrate good toleration and effectiveness of intervention.   Baseline: Goal status: Met 01/17/23  2.  Pt will improve on 30S sit to  stand test to >/= 7 (1/2 of norm value) to demonstrate improving mobility  and strength Baseline: 2 Goal status: ongoing 01/21/23  3.  Pt will improve on lumbar ROM by 50% in all areas with decreasing pain Baseline: see chart Goal status: ongoing 01/21/23  4.  Pt will report increased toleration to standing up towards 10 minutes to fix a meal. Baseline: <4 min  Goal status: ongoing 01/21/23  5.  Pt will report an overall decrease in max/worst pain to <6/10 to improve sleep pattern and decrease physiological responses to pain Baseline: 8/10 Goal status: ongoing 01/21/23     LONG TERM GOALS: Target date: 02/21/23  Pt to meet stated Foto Goal of 50 Baseline: 37% primary Goal status: INITIAL  2.  Pt will be indep with final HEP's (land and aquatic as appropriate) for continued management of condition  Baseline:  Goal status: INITIAL  3.  Pt will improve on Berg balance test to >/= 40/56 to demonstrate a decrease in fall risk.   Baseline: 26/56 Goal status: INITIAL  4.  Pt will improve on Tug test to </=  12s to demonstrate improvement in lower extremity function, mobility and decreased fall risk.  Baseline: 20.49 Goal status: INITIAL  5.  Pt will improve on strength of bilat hips at least 1 full grade  Baseline: see chart Goal status: INITIAL  6.  Pt will be able to walk through grocery store unlimited by fatigue or pain pushing cart to demonstrate improvement in functional mobility. Baseline: >5 mins Goal status: INITIAL  PLAN:  PT FREQUENCY: 1-2x/week  PT DURATION: other: 9 weeks  PLANNED INTERVENTIONS: Therapeutic exercises, Therapeutic activity, Neuromuscular re-education, Balance training, Gait training, Patient/Family education, Self Care, Joint mobilization, Joint manipulation, Stair training, Orthotic/Fit training, DME instructions, Aquatic Therapy, Dry Needling, Electrical stimulation, Spinal manipulation, Spinal mobilization, Cryotherapy, Moist heat, scar mobilization, Splintting, Taping, Traction, Ultrasound, Ionotophoresis 4mg /ml  Dexamethasone, Manual therapy, and Re-evaluation.  PLAN FOR NEXT SESSION: re-assess Aquatics: stretching and strengthening core, hips, LE. Balance and gait retraining, Posture retraining. Pain reduction.  Charlaine Provencal) Hailey Miles MPT 01/21/23 1:16 PM [facility] [address] [place], [place], [address] Phone: [phone]   Fax:  [phone]

## 2023-01-22 ENCOUNTER — Encounter: Payer: Self-pay | Admitting: Internal Medicine

## 2023-01-22 ENCOUNTER — Ambulatory Visit (INDEPENDENT_AMBULATORY_CARE_PROVIDER_SITE_OTHER): Payer: Medicare HMO | Admitting: Internal Medicine

## 2023-01-22 VITALS — BP 136/74 | HR 98 | Temp 97.9°F | Resp 16 | Ht 62.0 in | Wt 220.0 lb

## 2023-01-22 DIAGNOSIS — I10 Essential (primary) hypertension: Secondary | ICD-10-CM

## 2023-01-22 DIAGNOSIS — Z6837 Body mass index (BMI) 37.0-37.9, adult: Secondary | ICD-10-CM | POA: Diagnosis not present

## 2023-01-22 DIAGNOSIS — Z136 Encounter for screening for cardiovascular disorders: Secondary | ICD-10-CM | POA: Diagnosis not present

## 2023-01-22 DIAGNOSIS — R7309 Other abnormal glucose: Secondary | ICD-10-CM | POA: Diagnosis not present

## 2023-01-22 DIAGNOSIS — E66812 Obesity, class 2: Secondary | ICD-10-CM

## 2023-01-22 DIAGNOSIS — E782 Mixed hyperlipidemia: Secondary | ICD-10-CM | POA: Diagnosis not present

## 2023-01-22 DIAGNOSIS — Z0000 Encounter for general adult medical examination without abnormal findings: Secondary | ICD-10-CM

## 2023-01-22 DIAGNOSIS — Z1211 Encounter for screening for malignant neoplasm of colon: Secondary | ICD-10-CM

## 2023-01-22 DIAGNOSIS — E559 Vitamin D deficiency, unspecified: Secondary | ICD-10-CM

## 2023-01-22 DIAGNOSIS — I700 Atherosclerosis of aorta: Secondary | ICD-10-CM

## 2023-01-22 DIAGNOSIS — Z79899 Other long term (current) drug therapy: Secondary | ICD-10-CM | POA: Diagnosis not present

## 2023-01-22 DIAGNOSIS — Z0001 Encounter for general adult medical examination with abnormal findings: Secondary | ICD-10-CM

## 2023-01-22 DIAGNOSIS — M545 Low back pain, unspecified: Secondary | ICD-10-CM

## 2023-01-22 DIAGNOSIS — Z87891 Personal history of nicotine dependence: Secondary | ICD-10-CM

## 2023-01-22 MED ORDER — DEXAMETHASONE 4 MG PO TABS: ORAL_TABLET | ORAL | 0 refills | Status: DC

## 2023-01-22 NOTE — Patient Instructions (Signed)
Due to recent changes in healthcare laws, you may see the results of your imaging and laboratory studies on MyChart before your provider has had a chance to review them.  We understand that in some cases there may be results that are confusing or concerning to you. Not all laboratory results come back in the same time frame and the provider may be waiting for multiple results in order to interpret others.  Please give us 48 hours in order for your provider to thoroughly review all the results before contacting the office for clarification of your results.   +++++++++++++++++++++++++  Vit D  & Vit C 1,000 mg   are recommended to help protect  against the Covid-19 and other Corona viruses.    Also it's recommended  to take  Zinc 50 mg  to help  protect against the Covid-19   and best place to get  is also on [employer]  and don't pay more than 6-8 cents /pill !  ================================ Coronavirus (COVID-19) Are you at risk?  Are you at risk for the Coronavirus (COVID-19)?  To be considered HIGH RISK for Coronavirus (COVID-19), you have to meet the following criteria:  Traveled to [place], [place], [place], [place] or [place]; or in the [place] to [place], [place], [place]  or [place]; and have fever, cough, and shortness of breath within the last 2 weeks of travel OR Been in close contact with a person diagnosed with COVID-19 within the last 2 weeks and have  fever, cough,and shortness of breath  IF YOU DO NOT MEET THESE CRITERIA, YOU ARE CONSIDERED LOW RISK FOR COVID-19.  What to do if you are HIGH RISK for COVID-19?  If you are having a medical emergency, call 911. Seek medical care right away. Before you go to a doctor's office, urgent care or emergency department,  call ahead and tell them about your recent travel, contact with someone diagnosed with COVID-19   and your symptoms.  You should receive instructions from your physician's office regarding next  steps of care.  When you arrive at healthcare provider, tell the healthcare staff immediately you have returned from  visiting [place], [place], [place], [place] or [place]; or traveled in the [place] to [place], [place],  [place] or [place] in the last two weeks or you have been in close contact with a person diagnosed with  COVID-19 in the last 2 weeks.   Tell the health care staff about your symptoms: fever, cough and shortness of breath. After you have been seen by a medical provider, you will be either: Tested for (COVID-19) and discharged home on quarantine except to seek medical care if  symptoms worsen, and asked to  Stay home and avoid contact with others until you get your results (4-5 days)  Avoid travel on public transportation if possible (such as bus, train, or airplane) or Sent to the Emergency Department by EMS for evaluation, COVID-19 testing  and  possible admission depending on your condition and test results.  What to do if you are LOW RISK for COVID-19?  Reduce your risk of any infection by using the same precautions used for avoiding the common cold or flu:  Wash your hands often with soap and warm water for at least 20 seconds.  If soap and water are not readily available,  use an alcohol-based hand sanitizer with at least 60% alcohol.  If coughing or sneezing, cover your mouth and nose by coughing   or sneezing into the elbow areas of your shirt or coat,  into a tissue or into your sleeve (not your hands). Avoid shaking hands with others and consider head nods or verbal greetings only. Avoid touching your eyes, nose, or mouth with unwashed hands.  Avoid close contact with people who are sick. Avoid places or events with large numbers of people in one location, like concerts or sporting events. Carefully consider travel plans you have or are making. If you are planning any travel outside or inside the [place], visit the CDC's Travelers' Health webpage for the  latest health notices. If you have some symptoms but not all symptoms, continue to monitor at home and seek medical attention  if your symptoms worsen. If you are having a medical emergency, call 911.   >>>>>>>>>>>>>>>>>>>>>>>>>>>>>>>>>  We Do NOT Approve of [employer] > > > > > > > > > > > > > > > > > > > > > > > > > > > > > > > > > > > > > > >  Preventive Care for Adults  A healthy lifestyle and preventive care can promote health and wellness. Preventive health guidelines for women include the following key practices. A routine yearly physical is a good way to check with your health care provider about your health and preventive screening. It is a chance to share any concerns and updates on your health and to receive a thorough exam. Visit your dentist for a routine exam and preventive care every 6 months. Brush your teeth twice a day and floss once a day. Good oral hygiene prevents tooth decay and gum disease. The frequency of eye exams is based on your age, health, family medical history, use of contact lenses, and other factors. Follow your health care provider's recommendations for frequency of eye exams. Eat a healthy diet. Foods like vegetables, fruits, whole grains, low-fat dairy products, and lean protein foods contain the nutrients you need without too many calories. Decrease your intake of foods high in solid fats, added sugars, and salt. Eat the right amount of calories for you. Get information about a proper diet from your health care provider, if necessary. Regular physical exercise is one of the most important things you can do for your health. Most adults should get at least 150 minutes of moderate-intensity exercise (any activity that increases your heart rate and causes you to sweat) each week. In addition, most adults need muscle-strengthening exercises on 2 or more days a week. Maintain a healthy weight. The body mass index (BMI) is a screening tool to identify  possible weight problems. It provides an estimate of body fat based on height and weight. Your health care provider can find your BMI and can help you achieve or maintain a healthy weight. For adults 20 years and older: A BMI below 18.5 is considered underweight. A BMI of 18.5 to 24.9 is normal. A BMI of 25 to 29.9 is considered overweight. A BMI of 30 and above is considered obese. Maintain normal blood lipids and cholesterol levels by exercising and minimizing your intake of saturated fat. Eat a balanced diet with plenty of fruit and vegetables. If your lipid or cholesterol levels are high, you are over 50, or you are at high risk for heart disease, you may need your cholesterol levels checked more frequently. Ongoing high lipid and cholesterol levels should be treated with medicines if diet and exercise are not working. If you smoke, find out from   your health care provider how to quit. If you do not use tobacco, do not start. Lung cancer screening is recommended for adults aged 55-80 years who are at high risk for developing lung cancer because of a history of smoking. A yearly low-dose CT scan of the lungs is recommended for people who have at least a 30-pack-year history of smoking and are a current smoker or have quit within the past 15 years. A pack year of smoking is smoking an average of 1 pack of cigarettes a day for 1 year (for example: 1 pack a day for 30 years or 2 packs a day for 15 years). Yearly screening should continue until the smoker has stopped smoking for at least 15 years. Yearly screening should be stopped for people who develop a health problem that would prevent them from having lung cancer treatment. Avoid use of street drugs. Do not share needles with anyone. Ask for help if you need support or instructions about stopping the use of drugs. High blood pressure causes heart disease and increases the risk of stroke.  Ongoing high blood pressure should be treated with medicines if  weight loss and exercise do not work. If you are 55-79 years old, ask your health care provider if you should take aspirin to prevent strokes. Diabetes screening involves taking a blood sample to check your fasting blood sugar level. This should be done once every 3 years, after age 45, if you are within normal weight and without risk factors for diabetes. Testing should be considered at a younger age or be carried out more frequently if you are overweight and have at least 1 risk factor for diabetes. Breast cancer screening is essential preventive care for women. You should practice "breast self-awareness." This means understanding the normal appearance and feel of your breasts and may include breast self-examination. Any changes detected, no matter how small, should be reported to a health care provider. Women in their 20s and 30s should have a clinical breast exam (CBE) by a health care provider as part of a regular health exam every 1 to 3 years. After age 40, women should have a CBE every year. Starting at age 40, women should consider having a mammogram (breast X-ray test) every year. Women who have a family history of breast cancer should talk to their health care provider about genetic screening. Women at a high risk of breast cancer should talk to their health care providers about having an MRI and a mammogram every year. Breast cancer gene (BRCA)-related cancer risk assessment is recommended for women who have family members with BRCA-related cancers. BRCA-related cancers include breast, ovarian, tubal, and peritoneal cancers. Having family members with these cancers may be associated with an increased risk for harmful changes (mutations) in the breast cancer genes BRCA1 and BRCA2. Results of the assessment will determine the need for genetic counseling and BRCA1 and BRCA2 testing. Routine pelvic exams to screen for cancer are no longer recommended for nonpregnant women who are considered low risk for  cancer of the pelvic organs (ovaries, uterus, and vagina) and who do not have symptoms. Ask your health care provider if a screening pelvic exam is right for you. If you have had past treatment for cervical cancer or a condition that could lead to cancer, you need Pap tests and screening for cancer for at least 20 years after your treatment. If Pap tests have been discontinued, your risk factors (such as having a new sexual partner) need to be   reassessed to determine if screening should be resumed. Some women have medical problems that increase the chance of getting cervical cancer. In these cases, your health care provider may recommend more frequent screening and Pap tests.  Colorectal cancer can be detected and often prevented. Most routine colorectal cancer screening begins at the age of 50 years and continues through age 75 years. However, your health care provider may recommend screening at an earlier age if you have risk factors for colon cancer. On a yearly basis, your health care provider may provide home test kits to check for hidden blood in the stool. Use of a small camera at the end of a tube, to directly examine the colon (sigmoidoscopy or colonoscopy), can detect the earliest forms of colorectal cancer. Talk to your health care provider about this at age 50, when routine screening begins.  Direct exam of the colon should be repeated every 5-10 years through age 75 years, unless early forms of pre-cancerous polyps or small growths are found. Osteoporosis is a disease in which the bones lose minerals and strength with aging. This can result in serious bone fractures or breaks. The risk of osteoporosis can be identified using a bone density scan. Women ages 65 years and over and women at risk for fractures or osteoporosis should discuss screening with their health care providers. Ask your health care provider whether you should take a calcium supplement or vitamin D to reduce the rate of  osteoporosis. Menopause can be associated with physical symptoms and risks. Hormone replacement therapy is available to decrease symptoms and risks. You should talk to your health care provider about whether hormone replacement therapy is right for you. Use sunscreen. Apply sunscreen liberally and repeatedly throughout the day. You should seek shade when your shadow is shorter than you. Protect yourself by wearing long sleeves, pants, a wide-brimmed hat, and sunglasses year round, whenever you are outdoors. Once a month, do a whole body skin exam, using a mirror to look at the skin on your back. Tell your health care provider of new moles, moles that have irregular borders, moles that are larger than a pencil eraser, or moles that have changed in shape or color. Stay current with required vaccines (immunizations). Influenza vaccine. All adults should be immunized every year. Tetanus, diphtheria, and acellular pertussis (Td, Tdap) vaccine. Pregnant women should receive 1 dose of Tdap vaccine during each pregnancy. The dose should be obtained regardless of the length of time since the last dose. Immunization is preferred during the 27th-36th week of gestation. An adult who has not previously received Tdap or who does not know her vaccine status should receive 1 dose of Tdap. This initial dose should be followed by tetanus and diphtheria toxoids (Td) booster doses every 10 years. Adults with an unknown or incomplete history of completing a 3-dose immunization series with Td-containing vaccines should begin or complete a primary immunization series including a Tdap dose. Adults should receive a Td booster every 10 years.  Zoster vaccine. One dose is recommended for adults aged 60 years or older unless certain conditions are present.  Pneumococcal 13-valent conjugate (PCV13) vaccine. When indicated, a person who is uncertain of her immunization history and has no record of immunization should receive the PCV13  vaccine. An adult aged 19 years or older who has certain medical conditions and has not been previously immunized should receive 1 dose of PCV13 vaccine. This PCV13 should be followed with a dose of pneumococcal polysaccharide (PPSV23) vaccine. The PPSV23   vaccine dose should be obtained at least 1 or more year(s) after the dose of PCV13 vaccine. An adult aged 19 years or older who has certain medical conditions and previously received 1 or more doses of PPSV23 vaccine should receive 1 dose of PCV13. The PCV13 vaccine dose should be obtained 1 or more years after the last PPSV23 vaccine dose.  Pneumococcal polysaccharide (PPSV23) vaccine. When PCV13 is also indicated, PCV13 should be obtained first. All adults aged 65 years and older should be immunized. An adult younger than age 65 years who has certain medical conditions should be immunized. Any person who resides in a nursing home or long-term care facility should be immunized. An adult smoker should be immunized. People with an immunocompromised condition and certain other conditions should receive both PCV13 and PPSV23 vaccines. People with human immunodeficiency virus (HIV) infection should be immunized as soon as possible after diagnosis. Immunization during chemotherapy or radiation therapy should be avoided. Routine use of PPSV23 vaccine is not recommended for American Indians, [place] Natives, or people younger than 65 years unless there are medical conditions that require PPSV23 vaccine. When indicated, people who have unknown immunization and have no record of immunization should receive PPSV23 vaccine. One-time revaccination 5 years after the first dose of PPSV23 is recommended for people aged 19-64 years who have chronic kidney failure, nephrotic syndrome, asplenia, or immunocompromised conditions. People who received 1-2 doses of PPSV23 before age 65 years should receive another dose of PPSV23 vaccine at age 65 years or later if at least 5 years have  passed since the previous dose. Doses of PPSV23 are not needed for people immunized with PPSV23 at or after age 65 years.  Preventive Services / Frequency  Ages 65 years and over Blood pressure check. Lipid and cholesterol check. Lung cancer screening. / Every year if you are aged 55-80 years and have a 30-pack-year history of smoking and currently smoke or have quit within the past 15 years. Yearly screening is stopped once you have quit smoking for at least 15 years or develop a health problem that would prevent you from having lung cancer treatment. Clinical breast exam.** / Every year after age 40 years.  BRCA-related cancer risk assessment.** / For women who have family members with a BRCA-related cancer (breast, ovarian, tubal, or peritoneal cancers). Mammogram.** / Every year beginning at age 40 years and continuing for as long as you are in good health. Consult with your health care provider. Pap test.** / Every 3 years starting at age 30 years through age 65 or 70 years with 3 consecutive normal Pap tests. Testing can be stopped between 65 and 70 years with 3 consecutive normal Pap tests and no abnormal Pap or HPV tests in the past 10 years. Fecal occult blood test (FOBT) of stool. / Every year beginning at age 50 years and continuing until age 75 years. You may not need to do this test if you get a colonoscopy every 10 years. Flexible sigmoidoscopy or colonoscopy.** / Every 5 years for a flexible sigmoidoscopy or every 10 years for a colonoscopy beginning at age 50 years and continuing until age 75 years. Hepatitis C blood test.** / For all people born from 1945 through 1965 and any individual with known risks for hepatitis C. Osteoporosis screening.** / A one-time screening for women ages 65 years and over and women at risk for fractures or osteoporosis. Skin self-exam. / Monthly. Influenza vaccine. / Every year. Tetanus, diphtheria, and acellular pertussis (Tdap/Td) vaccine.** /   1 dose  of Td every 10 years. Zoster vaccine.** / 1 dose for adults aged 60 years or older. Pneumococcal 13-valent conjugate (PCV13) vaccine.** / Consult your health care provider. Pneumococcal polysaccharide (PPSV23) vaccine.** / 1 dose for all adults aged 65 years and older. Screening for abdominal aortic aneurysm (AAA)  by ultrasound is recommended for people who have history of high blood pressure or who are current or former smokers. ++++++++++++++++++++ Recommend Adult Low Dose Aspirin or  coated  Aspirin 81 mg daily  To reduce risk of Colon Cancer 40 %,  Skin Cancer 26 % ,  Melanoma 46%  and  Pancreatic cancer 60% ++++++++++++++++++++ Vitamin D goal  is between 70-100.  Please make sure that you are taking your Vitamin D as directed.  It is very important as a natural anti-inflammatory  helping hair, skin, and nails, as well as reducing stroke and heart attack risk.  It helps your bones and helps with mood. It also decreases numerous cancer risks so please take it as directed.  Low Vit D is associated with a 200-300% higher risk for CANCER  and 200-300% higher risk for HEART   ATTACK  &  STROKE.   ...................................... It is also associated with higher death rate at younger ages,  autoimmune diseases like Rheumatoid arthritis, Lupus, Multiple Sclerosis.    Also many other serious conditions, like depression, Alzheimer's Dementia, infertility, muscle aches, fatigue, fibromyalgia - just to name a few. ++++++++++++++++++ Recommend the book "The END of DIETING" by Dr Joel Fuhrman  & the book "The END of DIABETES " by Dr Joel Fuhrman At [employer] - get book & Audio CD's    Being diabetic has a  300% increased risk for heart attack, stroke, cancer, and alzheimer- type vascular dementia. It is very important that you work harder with diet by avoiding all foods that are white. Avoid white rice (brown & wild rice is OK), white potatoes (sweetpotatoes in moderation is OK),  White bread or wheat bread or anything made out of white flour like bagels, donuts, rolls, buns, biscuits, cakes, pastries, cookies, pizza crust, and pasta (made from white flour & egg whites) - vegetarian pasta or spinach or wheat pasta is OK. Multigrain breads like Arnold's or Pepper[employer], or multigrain sandwich thins or flatbreads.  Diet, exercise and weight loss can reverse and cure diabetes in the early stages.  Diet, exercise and weight loss is very important in the control and prevention of complications of diabetes which affects every system in your body, ie. Brain - dementia/stroke, eyes - glaucoma/blindness, heart - heart attack/heart failure, kidneys - dialysis, stomach - gastric paralysis, intestines - malabsorption, nerves - severe painful neuritis, circulation - gangrene & loss of a leg(s), and finally cancer and Alzheimers.    I recommend avoid fried & greasy foods,  sweets/candy, white rice (brown or wild rice or Quinoa is OK), white potatoes (sweet potatoes are OK) - anything made from white flour - bagels, doughnuts, rolls, buns, biscuits,white and wheat breads, pizza crust and traditional pasta made of white flour & egg white(vegetarian pasta or spinach or wheat pasta is OK).  Multi-grain bread is OK - like multi-grain flat bread or sandwich thins. Avoid alcohol in excess. Exercise is also important.    Eat all the vegetables you want - avoid meat, especially red meat and dairy - especially cheese.  Cheese is the most concentrated form of trans-fats which is the worst thing to clog up our arteries. Veggie cheese is OK   which can be found in the fresh produce section at [employer] or [employer] or [employer]  +++++++++++++++++++ DASH Eating Plan  DASH stands for "Dietary Approaches to Stop Hypertension."   The DASH eating plan is a healthy eating plan that has been shown to reduce high blood pressure (hypertension). Additional health benefits may include reducing the risk of type 2  diabetes mellitus, heart disease, and stroke. The DASH eating plan may also help with weight loss. WHAT DO I NEED TO KNOW ABOUT THE DASH EATING PLAN? For the DASH eating plan, you will follow these general guidelines: Choose foods with a percent daily value for sodium of less than 5% (as listed on the food label). Use salt-free seasonings or herbs instead of table salt or sea salt. Check with your health care provider or pharmacist before using salt substitutes. Eat lower-sodium products, often labeled as "lower sodium" or "no salt added." Eat fresh foods. Eat more vegetables, fruits, and low-fat dairy products. Choose whole grains. Look for the word "whole" as the first word in the ingredient list. Choose fish  Limit sweets, desserts, sugars, and sugary drinks. Choose heart-healthy fats. Eat veggie cheese  Eat more home-cooked food and less restaurant, buffet, and fast food. Limit fried foods. Cook foods using methods other than frying. Limit canned vegetables. If you do use them, rinse them well to decrease the sodium. When eating at a restaurant, ask that your food be prepared with less salt, or no salt if possible.                      WHAT FOODS CAN I EAT? Read Dr Joel Fuhrman's books on The End of Dieting & The End of Diabetes  Grains Whole grain or whole wheat bread. Brown rice. Whole grain or whole wheat pasta. Quinoa, bulgur, and whole grain cereals. Low-sodium cereals. Corn or whole wheat flour tortillas. Whole grain cornbread. Whole grain crackers. Low-sodium crackers.  Vegetables Fresh or frozen vegetables (raw, steamed, roasted, or grilled). Low-sodium or reduced-sodium tomato and vegetable juices. Low-sodium or reduced-sodium tomato sauce and paste. Low-sodium or reduced-sodium canned vegetables.   Fruits All fresh, canned (in natural juice), or frozen fruits.  Protein Products  All fish and seafood.  Dried beans, peas, or lentils. Unsalted nuts and seeds. Unsalted  canned beans.  Dairy Low-fat dairy products, such as skim or 1% milk, 2% or reduced-fat cheeses, low-fat ricotta or cottage cheese, or plain low-fat yogurt. Low-sodium or reduced-sodium cheeses.  Fats and Oils Tub margarines without trans fats. Light or reduced-fat mayonnaise and salad dressings (reduced sodium). Avocado. Safflower, olive, or canola oils. Natural peanut or almond butter.  Other Unsalted popcorn and pretzels. The items listed above may not be a complete list of recommended foods or beverages. Contact your dietitian for more options.  +++++++++++++++  WHAT FOODS ARE NOT RECOMMENDED? Grains/ White flour or wheat flour White bread. White pasta. White rice. Refined cornbread. Bagels and croissants. Crackers that contain trans fat.  Vegetables  Creamed or fried vegetables. Vegetables in a . Regular canned vegetables. Regular canned tomato sauce and paste. Regular tomato and vegetable juices.  Fruits Dried fruits. Canned fruit in light or heavy syrup. Fruit juice.  Meat and Other Protein Products Meat in general - RED meat & White meat.  Fatty cuts of meat. Ribs, chicken wings, all processed meats as bacon, sausage, bologna, salami, fatback, hot dogs, bratwurst and packaged luncheon meats.  Dairy Whole or 2% milk, cream, half-and-half, and cream cheese.   Whole-fat or sweetened yogurt. Full-fat cheeses or blue cheese. Non-dairy creamers and whipped toppings. Processed cheese, cheese spreads, or cheese curds.  Condiments Onion and garlic salt, seasoned salt, table salt, and sea salt. Canned and packaged gravies. Worcestershire sauce. Tartar sauce. Barbecue sauce. Teriyaki sauce. Soy sauce, including reduced sodium. Steak sauce. Fish sauce. Oyster sauce. Cocktail sauce. Horseradish. Ketchup and mustard. Meat flavorings and tenderizers. Bouillon cubes. Hot sauce. Tabasco sauce. Marinades. Taco seasonings. Relishes.  Fats and Oils Butter, stick margarine, lard, shortening and  bacon fat. Coconut, palm kernel, or palm oils. Regular salad dressings.  Pickles and olives. Salted popcorn and pretzels.  The items listed above may not be a complete list of foods and beverages to avoid.   

## 2023-01-22 NOTE — Progress Notes (Signed)
Annual Screening/Preventative Visit & Comprehensive Evaluation &  Examination   Future Appointments  Date Time Provider Department  01/22/2023                cpe 10:00 AM Unk Pinto, MD [facility]  04/24/2023               wellness 11:00 AM Darrol Jump, NP [facility]  01/22/2024                cpe 11:00 AM Unk Pinto, MD [facility]          This very nice 67 y.o. [name] presents for a Screening /Preventative Visit & comprehensive evaluation and management of multiple medical co-morbidities.  Patient has been followed for HTN, HLD, Prediabetes  and Vitamin D Deficiency.    In Apr 2019,   Chest CT scan showed Aortic Atherosclerosis.  Patient is treated by Dr Chucky May  for ADD & Depression with Fluoxetine /Bupropion & Methylphenidate.  Patient has had recent surgeries to the Left foot x 2.  In May 2023 , patient was hpspitalized with Covid PNA and was  hospitalized again in July 2023 with a Right PE subsequently taking Eliquiis for 6 months thru Jan 2024.        Patient has had ongoing problems with back pain  & is followed by Dr Tonita Cong . MRI showed 22% wedge compression Fx of T12, 36% compression Fx of L1, neuro foraminal narrowing of Left L4-5 & Right L3-4. Currently she is receiving aquatic therapy & PT and is scheduled for an ablation .        HTN predates circa 2008.   Patient's BP has been controlled at home and patient denies any cardiac symptoms as chest pain, palpitations, shortness of breath, dizziness or ankle swelling. Today's BP is at goal - 120/82.       Patient's hyperlipidemia is controlled with diet and medications. Patient denies myalgias or other medication SE's. Last lipids were at goal :  Lab Results  Component Value Date   CHOL 177 01/10/2021   HDL 79 01/10/2021   LDLCALC 79 01/10/2021   TRIG 108 01/10/2021   CHOLHDL 2.2 01/10/2021         Patient has moderate Obesity (BMI 37+) & consequent  prediabetes (5.9% /2014) and patient denies  reactive hypoglycemic symptoms, visual blurring, diabetic polys or paresthesias. Last A1c was normal & at goal :  Lab Results  Component Value Date   HGBA1C 5.4 01/10/2021         Finally, patient has history of Vitamin D Deficiency ("20" /2010) and last Vitamin D was at goal :  Lab Results  Component Value Date   VD25OH 100 01/10/2021     Current Outpatient Medications on File Prior to Visit  Medication Sig   albuterol HFA inhaler Inhale 2 puffs every 4 hours as needed for    VITAMIN C  Take 1 tablet    B Complex Vitamins  Take 1 tablet daily.   buPROPion XL 300 MG  Take  every morning.   CALCIUM 600  Take  daily.   CELEBREX 100 MG  Take 1 capsule  2  times daily.   EPINEPHrine 0.3 mg/0.3 mL     fexofenadine  180 MG tablet 1 tablet   FLUoxetine20 MG capsule Take 1 capsule daily. For depression   FLONASE  nasal spray 1 - 2 SPRAYS qd   gabapentin 100 MG capsule TAKE 1 CAPSULE AT BEDTIME  gabapentin  300 MG capsule Take 1 capsule daily.   VICODIN 5-325 MG tablet Take 1 tablet every 6  hours as needed.   levocetirizine 5 MG tablet TK 1 T PO QD    lisinopril 20 MG tablet TAKE 1 TABLET  DAILY    Magnesium 400 MG TABS Take 1 tablet  daily.   methylphenidate  10 MG tablet TK 1 T PO BID   montelukast  10 MG tablet 1 tablet   rosuvastatin  40 MG tablet TAKE 1 TABLET  DAILY    SYMBICORT 160-4.5 inhaler INL 2 PFS PO BID   VITAMIN D PO Take 15,000 Units daily.      Allergies  Allergen Reactions   Molds & Smuts Anaphylaxis   Biaxin [Clarithromycin]     GI upset   Serevent [Salmeterol]     Palpitations   Tequin [Gatifloxacin]     GI upset. thrush     Past Medical History:  Diagnosis Date   Anxiety    Arthritis    Asthma    Cataract    Gout    Hyperlipidemia    Hypertension    Obese      Health Maintenance  Topic Date Due   Hepatitis C Screening  Never done   Zoster Vaccines- Shingrix (1 of 2) Never done   COVID-19 Vaccine (4 - Booster for Pfizer series)  05/22/2020   INFLUENZA VACCINE  06/04/2021   MAMMOGRAM  01/13/2022   Pneumonia Vaccine 37+ Years old (3) 10/08/2022   PAP SMEAR-Modifier  11/28/2022   TETANUS/TDAP  03/28/2030   DEXA SCAN  Completed   HIV Screening  Completed   HPV VACCINES  Aged Out     Immunization History  Administered Date(s) Administered   Influenza, High Dose  10/17/2017   Influenza,inj,Quad  09/17/2017   Influenza 08/20/2018   PFIZER SARS-COV-2 Vacc 01/20/2020, 02/10/2020, 03/27/2020   PPD Test 10/08/2017, 11/29/2019, 01/10/2021   Pneumococcal - 13 11/05/1995   Pneumococcal - 23 10/08/2017   Td 11/29/2019   Tdap 11/04/2008, 03/28/2020     Last Colon - 12/27/2019 - Dr Silverio Decamp - Recommended 3 year f/u due Mar 2024   Last MGM - 01/15/2020   Last BMD - 02/25/2020 - Osteopenia (T-2.0)    Past Surgical History:  Procedure Laterality Date   APPENDECTOMY  1984   CATARACT EXTRACTION W/ INTRAOCULAR LENS  IMPLANT, BILATERAL  2014   CESAREAN SECTION x 3   1984, 1987, 1994   COLONOSCOPY  11/16/2013   w/Brodie    FOOT SURGERY Left 2002   KNEE SURGERY Left 1998   ROTATOR CUFF REPAIR Right 2007   TUBAL LIGATION  1994     Family History  Problem Relation Age of Onset   Dementia Father    Anxiety disorder Sister    Colon cancer Neg Hx    Colon polyps Neg Hx    Esophageal cancer Neg Hx    Stomach cancer Neg Hx    Rectal cancer Neg Hx      Social History   Tobacco Use   Smoking status: Former    Packs/day: 1.00    Years: 7.00    Pack years: 7.00    Types: Cigarettes    Quit date: 11/04/1977    Years since quitting: 44.2   Smokeless tobacco: Never  Substance Use Topics   Alcohol use: Yes    Alcohol/week: 4.0 standard drinks    Types: 4 Glasses of wine per week   Drug use: No  ROS Constitutional: Denies fever, chills, weight loss/gain, headaches, insomnia,  night sweats, and change in appetite. Does c/o fatigue. Eyes: Denies redness, blurred vision, diplopia, discharge, itchy,  watery eyes.  ENT: Denies discharge, congestion, post nasal drip, epistaxis, sore throat, earache, hearing loss, dental pain, Tinnitus, Vertigo, Sinus pain, snoring.  Cardio: Denies chest pain, palpitations, irregular heartbeat, syncope, dyspnea, diaphoresis, orthopnea, PND, claudication, edema Respiratory: denies cough, dyspnea, DOE, pleurisy, hoarseness, laryngitis, wheezing.  Gastrointestinal: Denies dysphagia, heartburn, reflux, water brash, pain, cramps, nausea, vomiting, bloating, diarrhea, constipation, hematemesis, melena, hematochezia, jaundice, hemorrhoids Genitourinary: Denies dysuria, frequency, urgency, nocturia, hesitancy, discharge, hematuria, flank pain Breast: Breast lumps, nipple discharge, bleeding.  Musculoskeletal: Denies arthralgia, myalgia, stiffness, Jt. Swelling, pain, limp, and strain/sprain. Denies falls. Skin: Denies puritis, rash, hives, warts, acne, eczema, changing in skin lesion Neuro: No weakness, tremor, incoordination, spasms, paresthesia, pain Psychiatric: Denies confusion, memory loss, sensory loss. Denies Depression. Endocrine: Denies change in weight, skin, hair change, nocturia, and paresthesia, diabetic polys, visual blurring, hyper / hypo glycemic episodes.  Heme/Lymph: No excessive bleeding, bruising, enlarged lymph nodes.  Physical Exam  BP 136/74 Comment: Pt reports high due to severe back pain  Pulse 98   Temp 97.9 F (36.6 C)   Resp 16   Ht 5\' 2"  (1.575 m)   Wt 220 lb (99.8 kg)   LMP 11/26/2011   SpO2 98%   BMI 40.24 kg/m   General Appearance: Well nourished, well groomed and in no apparent distress.  Eyes: PERRLA, EOMs, conjunctiva no swelling or erythema, normal fundi and vessels. Sinuses: No frontal/maxillary tenderness ENT/Mouth: EACs patent / TMs  nl. Nares clear without erythema, swelling, mucoid exudates. Oral hygiene is good. No erythema, swelling, or exudate. Tongue normal, non-obstructing. Tonsils not swollen or erythematous.  Hearing normal.  Neck: Supple, thyroid not palpable. No bruits, nodes or JVD. Respiratory: Respiratory effort normal.  BS equal and clear bilateral without rales, rhonci, wheezing or stridor. Cardio: Heart sounds are normal with regular rate and rhythm and no murmurs, rubs or gallops. Peripheral pulses are normal and equal bilaterally without edema. No aortic or femoral bruits. Chest: symmetric with normal excursions and percussion. Breasts: Symmetric, without lumps, nipple discharge, retractions, or fibrocystic changes.  Abdomen: Flat, soft with bowel sounds active. Nontender, no guarding, rebound, hernias, masses, or organomegaly.  Lymphatics: Non tender without lymphadenopathy.  Musculoskeletal: Full ROM all peripheral extremities, joint stability, 5/5 strength, and normal gait. Skin: Warm and dry without rashes, lesions, cyanosis, clubbing or  ecchymosis.  Neuro: Cranial nerves intact, reflexes equal bilaterally. Normal muscle tone, no cerebellar symptoms. Sensation intact.  Pysch: Alert and oriented X 3, normal affect, Insight and Judgment appropriate.    Assessment and Plan  1. Annual Preventative/Screening Exam   2. Essential hypertension  - EKG 12-Lead - Urinalysis, Routine w reflex microscopic - Microalbumin / creatinine urine ratio - CBC with Differential/Platelet - COMPLETE METABOLIC PANEL WITH GFR - Magnesium - TSH  3. Hyperlipidemia, mixed  - EKG 12-Lead - Lipid panel - TSH  4. Abnormal glucose  - EKG 12-Lead - Hemoglobin A1c - Insulin, random  5. Class 2 severe obesity due to excess calories with serious  comorbidity and body mass index (BMI) of 37.0 to 37.9 in adult ([place])   6. Aortic atherosclerosis ([place]) by Chest CT scan on 02/02/2018   - EKG 12-Lead  7. Screening for colorectal cancer  - POC Hemoccult Bld/Stl  8. Screening for heart disease  - EKG 12-Lead  9. Former smoker  - EKG 12-Lead  10. Screening for AAA (aortic abdominal  aneurysm)  - EKG 12-Lead  11. Medication management  - Urinalysis, Routine w reflex microscopic - Microalbumin / creatinine urine ratio - CBC with Differential/Platelet - COMPLETE METABOLIC PANEL WITH GFR - Magnesium - Lipid panel - TSH - Hemoglobin A1c - Insulin, random - VITAMIN D 25 Hydroxy          Patient was counseled in prudent diet to achieve/maintain BMI less than 25 for weight control, BP monitoring, regular exercise and medications. Discussed med's effects and SE's. Screening labs and tests as requested with regular follow-up as recommended. Over 40 minutes of exam, counseling, chart review and high complex critical decision making was performed.   Kirtland Bouchard, MD

## 2023-01-23 ENCOUNTER — Encounter: Payer: Self-pay | Admitting: Internal Medicine

## 2023-01-23 LAB — MICROALBUMIN / CREATININE URINE RATIO
Creatinine, Urine: 169 mg/dL (ref 20–275)
Microalb Creat Ratio: 3 mg/g creat (ref <30)
Microalb, Ur: 0.5 mg/dL

## 2023-01-23 LAB — URINALYSIS, ROUTINE W REFLEX MICROSCOPIC
Bilirubin Urine: NEGATIVE
Glucose, UA: NEGATIVE
Hgb urine dipstick: NEGATIVE
Ketones, ur: NEGATIVE
Leukocytes,Ua: NEGATIVE
Nitrite: NEGATIVE
Protein, ur: NEGATIVE
Specific Gravity, Urine: 1.023 (ref 1.001–1.035)
pH: <=5 (ref 5.0–8.0)

## 2023-01-23 LAB — CBC WITH DIFFERENTIAL/PLATELET
Absolute Monocytes: 531 cells/uL (ref 200–950)
Basophils Absolute: 41 cells/uL (ref 0–200)
Basophils Relative: 0.6 %
Eosinophils Absolute: 269 cells/uL (ref 15–500)
Eosinophils Relative: 3.9 %
HCT: 41.2 % (ref 35.0–45.0)
Hemoglobin: 13.7 g/dL (ref 11.7–15.5)
Lymphs Abs: 2429 cells/uL (ref 850–3900)
MCH: 31.4 pg (ref 27.0–33.0)
MCHC: 33.3 g/dL (ref 32.0–36.0)
MCV: 94.5 fL (ref 80.0–100.0)
MPV: 10.5 fL (ref 7.5–12.5)
Monocytes Relative: 7.7 %
Neutro Abs: 3629 cells/uL (ref 1500–7800)
Neutrophils Relative %: 52.6 %
Platelets: 297 10*3/uL (ref 140–400)
RBC: 4.36 10*6/uL (ref 3.80–5.10)
RDW: 12 % (ref 11.0–15.0)
Total Lymphocyte: 35.2 %
WBC: 6.9 10*3/uL (ref 3.8–10.8)

## 2023-01-23 LAB — HEMOGLOBIN A1C
Hgb A1c MFr Bld: 6 % of total Hgb — ABNORMAL HIGH (ref <5.7)
Mean Plasma Glucose: 126 mg/dL
eAG (mmol/L): 7 mmol/L

## 2023-01-23 LAB — LIPID PANEL
Cholesterol: 172 mg/dL (ref <200)
HDL: 88 mg/dL (ref >=50)
LDL Cholesterol (Calc): 65 mg/dL (calc)
Non-HDL Cholesterol (Calc): 84 mg/dL (calc) (ref <130)
Total CHOL/HDL Ratio: 2 (calc) (ref <5.0)
Triglycerides: 108 mg/dL (ref <150)

## 2023-01-23 LAB — INSULIN, RANDOM: Insulin: 16.5 u[IU]/mL

## 2023-01-23 LAB — COMPLETE METABOLIC PANEL WITH GFR
AG Ratio: 2.5 (calc) (ref 1.0–2.5)
ALT: 24 U/L (ref 6–29)
AST: 20 U/L (ref 10–35)
Albumin: 4.7 g/dL (ref 3.6–5.1)
Alkaline phosphatase (APISO): 74 U/L (ref 37–153)
BUN: 17 mg/dL (ref 7–25)
CO2: 25 mmol/L (ref 20–32)
Calcium: 9.7 mg/dL (ref 8.6–10.4)
Chloride: 106 mmol/L (ref 98–110)
Creat: 0.81 mg/dL (ref 0.50–1.05)
Globulin: 1.9 g/dL (calc) (ref 1.9–3.7)
Glucose, Bld: 101 mg/dL — ABNORMAL HIGH (ref 65–99)
Potassium: 4.6 mmol/L (ref 3.5–5.3)
Sodium: 143 mmol/L (ref 135–146)
Total Bilirubin: 0.6 mg/dL (ref 0.2–1.2)
Total Protein: 6.6 g/dL (ref 6.1–8.1)
eGFR: 80 mL/min/{1.73_m2} (ref >=60)

## 2023-01-23 LAB — VITAMIN D 25 HYDROXY (VIT D DEFICIENCY, FRACTURES): Vit D, 25-Hydroxy: 91 ng/mL (ref 30–100)

## 2023-01-23 LAB — MAGNESIUM: Magnesium: 1.8 mg/dL (ref 1.5–2.5)

## 2023-01-23 LAB — TSH: TSH: 0.95 mIU/L (ref 0.40–4.50)

## 2023-01-23 NOTE — Progress Notes (Signed)
<><><><><><><><><><><><><><><><><><><><><><><><><><><><><><><><><> <><><><><><><><><><><><><><><><><><><><><><><><><><><><><><><><><> - Test results slightly outside the reference range are not unusual. If there is anything important, I will review this with you,  otherwise it is considered normal test values.  If you have further questions,  please do not hesitate to contact me at the office or via My Chart.  <><><><><><><><><><><><><><><><><><><><><><><><><><><><><><><><><> <><><><><><><><><><><><><><><><><><><><><><><><><><><><><><><><><>  -  A1c = 6.0% - Blood sugar and A1c are elevated in the borderline and  early or pre-diabetes range which has the same   300% increased risk for heart attack, stroke, cancer and   alzheimer- type vascular dementia as full blown diabetes.   But the good news is that diet, exercise with  weight loss can cure the early diabetes at this point.   <><><><><><><><><><><><><><><><><><><><><><><><><><><><><><><><><>  -  it is very important that you work harder with diet by                                    avoiding all foods that are white except chicken, fish & calliflower.  - Avoid white rice  (brown & wild rice is OK),   - Avoid white potatoes  (sweet potatoes in moderation is OK),   White bread or wheat bread or anything made out of                                             white flour like bagels, donuts, rolls, buns, biscuits, cakes,  - pastries, cookies, pizza crust, and pasta (made from white flour & egg whites)   - vegetarian pasta or spinach or wheat pasta is OK.  - Multigrain breads like Arnold's, Pepperidge Farm or                                                         multigrain sandwich thins or high fiber breads like   [place] bread or "Dave's Killer" breads that are 4 to 5 grams fiber per slice !  are best.    Diet, exercise and weight loss can reverse and cure diabetes in the early stages.    - Diet, exercise and weight  loss is very important in the   control and prevention of complications of diabetes which  affects every system in your body, ie.   -Brain - dementia/stroke,  - eyes - glaucoma/blindness,  - heart - heart attack/heart failure,  - kidneys - dialysis,  - stomach - gastric paralysis,  - intestines - malabsorption,  - nerves - severe painful neuritis,  - circulation - gangrene & loss of a leg(s)  - and finally  . . . . . . . . . . . . . . . . . .    - cancer and Alzheimers. <><><><><><><><><><><><><><><><><><><><><><><><><><><><><><><><><> <><><><><><><><><><><><><><><><><><><><><><><><><><><><><><><><><>  -   Magnesium = 1.8    is  very  low  - goal is betw 2.0 - 2.5,   - So..............Marland Kitchen  Recommend that you take Magnesium 500 mg tablet  2 x /day with Meals   - also important to eat lots of  leafy green vegetables   - spinach - Kale - collards - greens - okra - asparagus -  broccoli - quinoa - squash - almonds   - black, red, white beans  -  peas - green beans <><><><><><><><><><><><><><><><><><><><><><><><><><><><><><><><><>  -  Chol = 172   &   LDL Chol = 65  -   Both   Excellent   - Very low risk for Heart Attack  / Stroke <><><><><><><><><><><><><><><><><><><><><><><><><><><><><><><><><> <><><><><><><><><><><><><><><><><><><><><><><><><><><><><><><><><>  -  Vitamin D = 91 - Excellent   - Please keep dose same  <><><><><><><><><><><><><><><><><><><><><><><><><><><><><><><><><> <><><><><><><><><><><><><><><><><><><><><><><><><><><><><><><><><>  -  All Else - CBC - Kidneys - Electrolytes - Liver - Magnesium & Thyroid    - all  Normal / OK  <><><><><><><><><><><><><><><><><><><><><><><><><><><><><><><><><> <><><><><><><><><><><><><><><><><><><><><><><><><><><><><><><><><>  -  Keep up the [place] Work  !   <><><><><><><><><><><><><><><><><><><><><><><><><><><><><><><><><> <><><><><><><><><><><><><><><><><><><><><><><><><><><><><><><><><>

## 2023-01-24 ENCOUNTER — Encounter: Payer: Self-pay | Admitting: Podiatry

## 2023-01-24 ENCOUNTER — Encounter (HOSPITAL_BASED_OUTPATIENT_CLINIC_OR_DEPARTMENT_OTHER): Payer: Self-pay

## 2023-01-24 ENCOUNTER — Encounter: Payer: Self-pay | Admitting: Internal Medicine

## 2023-01-24 ENCOUNTER — Encounter (HOSPITAL_BASED_OUTPATIENT_CLINIC_OR_DEPARTMENT_OTHER): Payer: Medicare HMO | Admitting: Physical Therapy

## 2023-01-24 DIAGNOSIS — J3081 Allergic rhinitis due to animal (cat) (dog) hair and dander: Secondary | ICD-10-CM | POA: Diagnosis not present

## 2023-01-24 DIAGNOSIS — J3089 Other allergic rhinitis: Secondary | ICD-10-CM | POA: Diagnosis not present

## 2023-01-24 DIAGNOSIS — J301 Allergic rhinitis due to pollen: Secondary | ICD-10-CM | POA: Diagnosis not present

## 2023-01-26 DIAGNOSIS — F411 Generalized anxiety disorder: Secondary | ICD-10-CM | POA: Diagnosis not present

## 2023-01-26 DIAGNOSIS — R69 Illness, unspecified: Secondary | ICD-10-CM | POA: Diagnosis not present

## 2023-01-26 DIAGNOSIS — Z62820 Parent-biological child conflict: Secondary | ICD-10-CM | POA: Diagnosis not present

## 2023-01-26 DIAGNOSIS — F331 Major depressive disorder, recurrent, moderate: Secondary | ICD-10-CM | POA: Diagnosis not present

## 2023-01-28 ENCOUNTER — Ambulatory Visit (HOSPITAL_BASED_OUTPATIENT_CLINIC_OR_DEPARTMENT_OTHER): Payer: Medicare HMO | Admitting: Physical Therapy

## 2023-01-30 ENCOUNTER — Encounter (HOSPITAL_BASED_OUTPATIENT_CLINIC_OR_DEPARTMENT_OTHER): Payer: Medicare HMO | Admitting: Physical Therapy

## 2023-01-30 DIAGNOSIS — J301 Allergic rhinitis due to pollen: Secondary | ICD-10-CM | POA: Diagnosis not present

## 2023-02-04 ENCOUNTER — Ambulatory Visit (HOSPITAL_BASED_OUTPATIENT_CLINIC_OR_DEPARTMENT_OTHER): Payer: Medicare HMO | Admitting: Physical Therapy

## 2023-02-04 DIAGNOSIS — M47816 Spondylosis without myelopathy or radiculopathy, lumbar region: Secondary | ICD-10-CM | POA: Diagnosis not present

## 2023-02-06 ENCOUNTER — Ambulatory Visit (HOSPITAL_BASED_OUTPATIENT_CLINIC_OR_DEPARTMENT_OTHER): Payer: Medicare HMO | Admitting: Physical Therapy

## 2023-02-06 ENCOUNTER — Encounter (HOSPITAL_BASED_OUTPATIENT_CLINIC_OR_DEPARTMENT_OTHER): Payer: Self-pay

## 2023-02-07 ENCOUNTER — Encounter (HOSPITAL_BASED_OUTPATIENT_CLINIC_OR_DEPARTMENT_OTHER): Payer: Medicare HMO | Admitting: Physical Therapy

## 2023-02-07 ENCOUNTER — Encounter (HOSPITAL_BASED_OUTPATIENT_CLINIC_OR_DEPARTMENT_OTHER): Payer: Self-pay

## 2023-02-09 DIAGNOSIS — R69 Illness, unspecified: Secondary | ICD-10-CM | POA: Diagnosis not present

## 2023-02-09 DIAGNOSIS — F411 Generalized anxiety disorder: Secondary | ICD-10-CM | POA: Diagnosis not present

## 2023-02-09 DIAGNOSIS — F331 Major depressive disorder, recurrent, moderate: Secondary | ICD-10-CM | POA: Diagnosis not present

## 2023-02-09 DIAGNOSIS — Z62820 Parent-biological child conflict: Secondary | ICD-10-CM | POA: Diagnosis not present

## 2023-02-11 ENCOUNTER — Ambulatory Visit (HOSPITAL_BASED_OUTPATIENT_CLINIC_OR_DEPARTMENT_OTHER): Payer: Medicare HMO | Attending: Specialist | Admitting: Physical Therapy

## 2023-02-11 ENCOUNTER — Encounter (HOSPITAL_BASED_OUTPATIENT_CLINIC_OR_DEPARTMENT_OTHER): Payer: Self-pay | Admitting: Physical Therapy

## 2023-02-11 DIAGNOSIS — M5459 Other low back pain: Secondary | ICD-10-CM | POA: Diagnosis not present

## 2023-02-11 DIAGNOSIS — R2689 Other abnormalities of gait and mobility: Secondary | ICD-10-CM

## 2023-02-11 DIAGNOSIS — M6281 Muscle weakness (generalized): Secondary | ICD-10-CM | POA: Diagnosis not present

## 2023-02-11 DIAGNOSIS — R293 Abnormal posture: Secondary | ICD-10-CM

## 2023-02-11 NOTE — Therapy (Signed)
OUTPATIENT PHYSICAL THERAPY THORACOLUMBAR TREATMENT      Patient Name: AMIYRA BATCHELOR MRN: [id number] DOB:03/03/1956, 67 y.o., female Today's Date: 02/11/2023  END OF SESSION:  PT End of Session - 02/11/23 0938     Number of Visits 16    Date for PT Re-Evaluation 02/21/23    Authorization Type aetna medicare    Progress Note Due on Visit 20    PT Start Time 0940    PT Stop Time 1025    PT Time Calculation (min) 45 min    Activity Tolerance Patient tolerated treatment well    Behavior During Therapy WFL for tasks assessed/performed              Past Medical History:  Diagnosis Date   Anxiety    Arthritis    Asthma    Cataract    Gout    Hyperlipidemia    Hypertension    Obese    Past Surgical History:  Procedure Laterality Date   APPENDECTOMY  1984   CATARACT EXTRACTION W/ INTRAOCULAR LENS  IMPLANT, BILATERAL  2014   CESAREAN SECTION  1984, 1987, 1994   X3   COLONOSCOPY  11/16/2013   w/Brodie    FOOT SURGERY Left 2002   KNEE SURGERY Left 1998   ROTATOR CUFF REPAIR Right 2007   TUBAL LIGATION  1994   Patient Active Problem List   Diagnosis Date Noted   DOE (dyspnea on exertion) 05/10/2022   Acute pulmonary embolism 05/10/2022   Allergic rhinitis 03/15/2021   Gastro-esophageal reflux disease without esophagitis 03/15/2021   Mild persistent asthma, uncomplicated 03/15/2021   Aortic atherosclerosis (HCC) by Chest CT scan on 02/02/2018  01/10/2021   Asthma    Low back pain 03/17/2019   Lumbosacral spondylosis without myelopathy 03/17/2019   Cubital tunnel syndrome 10/14/2018   Closed fracture of distal end of right radius 07/09/2018   Pseudogout 12/09/2017   Major depressive disorder, recurrent, severe without psychotic features    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   Suicide attempt by drug ingestion    Medication management 08/22/2014   Vitamin D deficiency 08/22/2014   Hyperlipidemia, mixed 08/22/2014   Hypertension    Anxiety    Abnormal  glucose    Class 2 severe obesity due to excess calories with serious comorbidity and body mass index (BMI) of 37.0 to 37.9 in adult     PCP: Lucky Cowboy, MD   REFERRING PROVIDER: Jene Every, MD   REFERRING DIAG: M54.51 (ICD-10-CM) - Vertebrogenic low back pain   Rationale for Evaluation and Treatment: Rehabilitation  THERAPY DIAG:  Other low back pain  Muscle weakness (generalized)  Other abnormalities of gait and mobility  Abnormal posture  ONSET DATE: >9yr  SUBJECTIVE:  SUBJECTIVE STATEMENT: "Ablation last week.  I am doing better. Still being real careful"  PERTINENT HISTORY:  Patient is a very nice 67 yo div WF  with   HTN, HLD, Prediabetes  and Vitamin D Deficiency and had  hx/o recent Covid PNA~ May 2023  and  and in July 2023 was discovered to have a Rt sided PE and was hospitalized for Heparin & transitioned to Eliquis ( to continue 6 months  thru Jan 7 ) .    She was seen 5-6 weeks ago for evaluation for c/o  "shaking & forgetfulness " .  She reported poor short  & long term memory recall.  (Pt discontinued meds as per MD and reports back to baseline with memory)  PAIN:  Are you having pain? Yes: NPRS scale: 2-3/10 Pain location: Low lumbar area across hip to hip Pain description: ache, dull  Aggravating factors: standing <4 mins; walking >5 mins Relieving factors: Aspen lumbar brace; tylenol, ms relaxer  PRECAUTIONS: Back and Fall  WEIGHT BEARING RESTRICTIONS: No  FALLS:  Has patient fallen in last 6 months? No  LIVING ENVIRONMENT: Lives with: lives alone Lives in: House/apartment Stairs: Yes: External: 3 steps; none Has following equipment at home: Single point cane  OCCUPATION: retired  PLOF: Independent  PATIENT GOALS: less pain, be able to stand and walk  longer, get strength back  NEXT MD VISIT:  OBJECTIVE:   DIAGNOSTIC FINDINGS:  Pt reports recent [facility] at [facility] but no results in Epic  CLINICAL DATA: foot pain, soft tissue mass of left third toe US IMPRESSION: Focal tenosynovitis of an extensor tendon in the dorsal foot, with adjacent soft tissue swelling.    PATIENT SURVEYS:  FOTO 37 primary measure with goal of 50% on 12th visit 01/07/23:  42%   SCREENING FOR RED FLAGS:  Neg  COGNITION: Overall cognitive status: Within functional limits for tasks assessed     SENSATION: WFL Occasional P&N bilateral hips when back pain is increased  MUSCLE LENGTH: Hamstrings: Right 80 deg; Left 85 deg   POSTURE: rounded shoulders, anterior pelvic tilt, and flexed trunk   PALPATION: TTP moderately sensitive through lower lumbar and sacral area    LUMBAR ROM:   AROM eval 01/21/23  Flexion Finger tips to knees To knees  Extension 25% 50%  Right lateral flexion 10% 25%  Left lateral flexion 25% 50%  Right rotation 50% 50%  Left rotation 50% 50%   All movements limited by PAIN  LOWER EXTREMITY ROM:     WFL  LOWER EXTREMITY MMT:        P!=Pain MMT Right eval Left eval  Hip flexion 3+P! 3+P! Tested supine  Hip extension 3+P! 3+P!  Hip abduction 5 5  Hip adduction 5 5  Hip internal rotation    Hip external rotation    Knee flexion 5 5  Knee extension    Ankle dorsiflexion 5 5  Ankle plantarflexion    Ankle inversion    Ankle eversion     (Blank rows = not tested)  LUMBAR SPECIAL TESTS:  Straight leg raise test: Positive, Slump test: Negative, and Thomas test: Negative  FUNCTIONAL TESTS:  30s sit to stand: 2 Timed up and go (TUG): 20.49 Berg Balance Scale: 26/56  GAIT: Distance walked: 400 Assistive device utilized: None Level of assistance:  Indep  Comments: forward hip flex, dragging heels, no arm swing, slowed cadence and shortened step length  TODAY'S TREATMENT:  Pt seen for aquatic therapy today.  Treatment took place in water 3.5-4.5 ft in depth at the [facility]. Temp of water was 92.  Pt entered/exited the pool via stairs using step to pattern with hand  rail.  *Walking forward, backwards, side stepping unsupported 4 ft *L stretch then with tail wagging *KB row 2 sets in ea position; wide stance, feet staggered x 15 rep * Noodle press using solid noodle x 10 reps ea position; wide stance; staggered stance *core engagement with rainbow HB submerged to sides: forward and backward amb x 4 widths each. Side stepping x 4 widths * using green hand bells oscillation in sagittal plane and frontal plane crossing midline keeping slow rhythmic movements alternated with quick (10 each 1-2 sets) core stabilized *Decompression using yellow noddle across chest anteriorly.  - cycling; add/abd hips and flex/ext  Pt requires the buoyancy and hydrostatic pressure of water for support, and to offload joints by unweighting joint load by at least 50 % in navel deep water and by at least 75-80% in chest to neck deep water.  Viscosity of the water is needed for resistance of strengthening. Water current perturbations provides challenge to standing balance requiring increased core activation.   PATIENT EDUCATION:  Education details: aquatic progressions and modifications  Person educated: Patient Education method: Medical illustratorxplanation and Demonstration Education comprehension: verbalized understanding  HOME EXERCISE PROGRAM: Access Code: [email or web address] URL: [email or web address] Date: 01/07/2023 Prepared by: [facility] - [address]  Exercises - Walking March  - 1 x daily - 7 x weekly - Side Stepping with Hand Floats  - 1 x daily - 7 x weekly - Seated Straddle on Flotation Forward Breast Stroke Arms and Bicycle Legs  - 1 x daily - 7 x weekly - 3  sets - 10 reps - Shoulder Extension with Resistance  - 1 x daily - 7 x weekly - 1-2 sets - 10 reps - Split Stance Shoulder Row with Resistance  - 1 x daily - 7 x weekly - 1-2 sets - 10 reps - Standing Hip Abduction  - 1 x daily - 7 x weekly - 1-2 sets - 10 reps - Standing Hamstring Stretch on Chair  - 2 x daily - 7 x weekly - 2 reps - 20 seconds  hold - Standing 'L' Stretch at [employer]  - 1 x daily - 7 x weekly - 2 reps  ASSESSMENT:  CLINICAL IMPRESSION: Pt apprehensive initially with core strengthening after ablation but she was able to increase her intensity as session continued.  She has had a decrease in pain by at least 3 points on scale. Focus today on core strengthening with gentle stretching.  Pt with very good toleration. Pt to begin land based PT intervention next session for contunation and progression of core strengthening.   PN: pt has had intermittent improvement in functional mobility and pain throughout episode. Her overall pain symptoms have been at a lower intensity and she has had some ROM gains.  She will be having an ablation in 2 weeks. Pt declined functional testing today due to increased pain last few days. She has decided to put PT on hold until after procedure. Will re-evaluate at that time.       OBJECTIVE IMPAIRMENTS: Abnormal gait, decreased activity tolerance, decreased balance, decreased endurance, decreased knowledge of use of DME, decreased mobility, difficulty walking, decreased ROM, decreased strength, obesity, and pain.   ACTIVITY LIMITATIONS: carrying, lifting, bending, sitting, standing, squatting, sleeping, stairs, transfers, bed  mobility, and locomotion level  PARTICIPATION LIMITATIONS: cleaning, laundry, shopping, community activity, and yard work  PERSONAL FACTORS: Fitness, Past/current experiences, and 1-2 comorbidities: HTN, ankle dysfunction  are also affecting patient's functional outcome.   REHAB POTENTIAL: Good  CLINICAL DECISION MAKING:  Evolving/moderate complexity  EVALUATION COMPLEXITY: Moderate   GOALS: Goals reviewed with patient? Yes  SHORT TERM GOALS: Target date: 01/17/23  Pt will tolerate full aquatic sessions consistently without increase in pain and with improving function to demonstrate good toleration and effectiveness of intervention.   Baseline: Goal status: Met 01/17/23  2.  Pt will improve on 30S sit to stand test to >/= 7 (1/2 of norm value) to demonstrate improving mobility and strength Baseline: 2 Goal status: ongoing 01/21/23  3.  Pt will improve on lumbar ROM by 50% in all areas with decreasing pain Baseline: see chart Goal status: ongoing 01/21/23  4.  Pt will report increased toleration to standing up towards 10 minutes to fix a meal. Baseline: <4 min  Goal status: ongoing 01/21/23  5.  Pt will report an overall decrease in max/worst pain to <6/10 to improve sleep pattern and decrease physiological responses to pain Baseline: 8/10 Goal status: ongoing 01/21/23     LONG TERM GOALS: Target date: 02/21/23  Pt to meet stated Foto Goal of 50 Baseline: 37% primary Goal status: INITIAL  2.  Pt will be indep with final HEP's (land and aquatic as appropriate) for continued management of condition  Baseline:  Goal status: INITIAL  3.  Pt will improve on Berg balance test to >/= 40/56 to demonstrate a decrease in fall risk.   Baseline: 26/56 Goal status: INITIAL  4.  Pt will improve on Tug test to </=  12s to demonstrate improvement in lower extremity function, mobility and decreased fall risk.  Baseline: 20.49 Goal status: INITIAL  5.  Pt will improve on strength of bilat hips at least 1 full grade  Baseline: see chart Goal status: INITIAL  6.  Pt will be able to walk through grocery store unlimited by fatigue or pain pushing cart to demonstrate improvement in functional mobility. Baseline: >5 mins Goal status: INITIAL  PLAN:  PT FREQUENCY: 1-2x/week  PT DURATION: other: 9  weeks  PLANNED INTERVENTIONS: Therapeutic exercises, Therapeutic activity, Neuromuscular re-education, Balance training, Gait training, Patient/Family education, Self Care, Joint mobilization, Joint manipulation, Stair training, Orthotic/Fit training, DME instructions, Aquatic Therapy, Dry Needling, Electrical stimulation, Spinal manipulation, Spinal mobilization, Cryotherapy, Moist heat, scar mobilization, Splintting, Taping, Traction, Ultrasound, Ionotophoresis 4mg /ml Dexamethasone, Manual therapy, and Re-evaluation.  PLAN FOR NEXT SESSION: re-assess Aquatics: stretching and strengthening core, hips, LE. Balance and gait retraining, Posture retraining. Pain reduction.  Jentri [place]) Hoyte Ziebell MPT 02/11/23 9:44 AM [facility] [address] [place], [place], [address] Phone: [phone]   Fax:  [phone]

## 2023-02-14 ENCOUNTER — Encounter (HOSPITAL_BASED_OUTPATIENT_CLINIC_OR_DEPARTMENT_OTHER): Payer: Self-pay

## 2023-02-14 ENCOUNTER — Ambulatory Visit (HOSPITAL_BASED_OUTPATIENT_CLINIC_OR_DEPARTMENT_OTHER): Payer: Medicare HMO

## 2023-02-14 DIAGNOSIS — M6281 Muscle weakness (generalized): Secondary | ICD-10-CM

## 2023-02-14 DIAGNOSIS — J3089 Other allergic rhinitis: Secondary | ICD-10-CM | POA: Diagnosis not present

## 2023-02-14 DIAGNOSIS — J301 Allergic rhinitis due to pollen: Secondary | ICD-10-CM | POA: Diagnosis not present

## 2023-02-14 DIAGNOSIS — R293 Abnormal posture: Secondary | ICD-10-CM

## 2023-02-14 DIAGNOSIS — J3081 Allergic rhinitis due to animal (cat) (dog) hair and dander: Secondary | ICD-10-CM | POA: Diagnosis not present

## 2023-02-14 DIAGNOSIS — R2689 Other abnormalities of gait and mobility: Secondary | ICD-10-CM

## 2023-02-14 DIAGNOSIS — M5459 Other low back pain: Secondary | ICD-10-CM

## 2023-02-14 NOTE — Therapy (Signed)
OUTPATIENT PHYSICAL THERAPY THORACOLUMBAR TREATMENT      Patient Name: Haley White MRN: [id number] DOB:1956/10/01, 67 y.o., female Today's Date: 02/14/2023  END OF SESSION:  PT End of Session - 02/14/23 1006     Visit Number 12    Number of Visits 16    Date for PT Re-Evaluation 02/21/23    Authorization Type aetna medicare    Progress Note Due on Visit 20    Activity Tolerance Patient tolerated treatment well    Behavior During Therapy [facility] for tasks assessed/performed              Past Medical History:  Diagnosis Date   Anxiety    Arthritis    Asthma    Cataract    Gout    Hyperlipidemia    Hypertension    Obese    Past Surgical History:  Procedure Laterality Date   APPENDECTOMY  1984   CATARACT EXTRACTION W/ INTRAOCULAR LENS  IMPLANT, BILATERAL  2014   CESAREAN SECTION  1984, 1987, 1994   X3   COLONOSCOPY  11/16/2013   w/Brodie    FOOT SURGERY Left 2002   KNEE SURGERY Left 1998   ROTATOR CUFF REPAIR Right 2007   TUBAL LIGATION  1994   Patient Active Problem List   Diagnosis Date Noted   DOE (dyspnea on exertion) 05/10/2022   Acute pulmonary embolism 05/10/2022   Allergic rhinitis 03/15/2021   Gastro-esophageal reflux disease without esophagitis 03/15/2021   Mild persistent asthma, uncomplicated 03/15/2021   Aortic atherosclerosis (HCC) by Chest CT scan on 02/02/2018  01/10/2021   Asthma    Low back pain 03/17/2019   Lumbosacral spondylosis without myelopathy 03/17/2019   Cubital tunnel syndrome 10/14/2018   Closed fracture of distal end of right radius 07/09/2018   Pseudogout 12/09/2017   Major depressive disorder, recurrent, severe without psychotic features    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  ONSET DATE: >9yr  SUBJECTIVE:  SUBJECTIVE STATEMENT: Pt reports a "burning" sensation in L buttock area since ablation. "It's a normal side effect that happens sometimes, but it's really strong."  PERTINENT HISTORY:  Patient is a very nice 67 yo div WF  with   HTN, HLD, Prediabetes  and Vitamin D Deficiency and had  hx/o recent Covid PNA~ May 2023  and  and in July 2023 was discovered to have a Rt sided PE and was hospitalized for Heparin & transitioned to Eliquis ( to continue 6 months  thru Jan 7 ) .    She was seen 5-6 weeks ago for evaluation for c/o  "shaking & forgetfulness " .  She reported poor short  & long term memory recall.  (Pt discontinued meds as per MD and reports back to baseline with memory)  PAIN:  Are you having pain? Yes: NPRS scale: 2-3/10 Pain location: Low lumbar area across hip to hip Pain description: ache, dull  Aggravating factors: standing <4 mins; walking >5 mins Relieving factors: Aspen lumbar brace; tylenol, ms relaxer  PRECAUTIONS: Back and Fall  WEIGHT BEARING RESTRICTIONS: No  FALLS:  Has patient fallen in last 6 months? No  LIVING ENVIRONMENT: Lives with: lives alone Lives in: House/apartment Stairs: Yes: External: 3 steps; none Has following equipment at home: Single point cane  OCCUPATION: retired  PLOF: Independent  PATIENT GOALS: less pain, be able to stand and walk longer, get strength back  NEXT MD VISIT:  OBJECTIVE:   BERG Balance Test           Date: 4/12  Sit to Stand 4  Standing unsupported 4  Sitting with back unsupported but feet supported 4  Stand to sit  4  Transfers  4  Standing unsupported with eyes closed 4  Standing unsupported feet together 4  From standing position, reach forward with outstretched arm 4  From standing position, pick up object from floor 4  From standing position, turn and look behind over each shoulder 4  Turn 360 4  Standing unsupported, alternately place foot on step 4  Standing unsupported, one foot in front 4  Standing on one leg 3  Total:  55/56     DIAGNOSTIC FINDINGS:  Pt reports [facility] at [facility] but no results in Epic  CLINICAL DATA: foot pain, soft tissue mass of left third toe US IMPRESSION: Focal tenosynovitis of an extensor tendon in the dorsal foot, with adjacent soft tissue swelling.    PATIENT SURVEYS:  FOTO 37 primary measure with goal of 50% on 12th visit 01/07/23:  42%  02/14/23: 40%  SCREENING FOR RED FLAGS:  Neg  COGNITION: Overall cognitive status: Within functional limits for tasks assessed     SENSATION: WFL Occasional P&N bilateral hips when back pain is increased  MUSCLE LENGTH: Hamstrings: Right 80 deg; Left 85 deg   POSTURE: rounded shoulders, anterior pelvic tilt, and flexed trunk   PALPATION: TTP moderately sensitive through lower lumbar and sacral area    LUMBAR ROM:   AROM eval 01/21/23 4/12  Flexion Finger tips to knees To knees To knees  Extension 25% 50% 50%P!  Right lateral flexion 10% 25% 75%  Left lateral flexion 25% 50% 65%  Right rotation 50% 50% 75%  Left rotation 50% 50% 50%   All movements limited by PAIN  LOWER EXTREMITY ROM:     WFL  LOWER EXTREMITY MMT:        P!=Pain MMT Right eval Left eval 4/12 L/R  Hip flexion 3+P!  3+P! Tested supine 4-P!/4 Tested seated  Hip extension 3+P! 3+P!   Hip abduction 5 5   Hip adduction 5 5   Hip internal rotation     Hip external rotation     Knee flexion 5 5    Knee extension     Ankle dorsiflexion 5 5   Ankle plantarflexion     Ankle inversion     Ankle eversion      (Blank rows = not tested)  LUMBAR SPECIAL TESTS:  Straight leg raise test: Positive, Slump test: Negative, and Thomas test: Negative  FUNCTIONAL TESTS:  30s sit to stand: 2 Timed up and go (TUG): 20.49 Berg Balance Scale: 26/56  4/12: 30s STS: 9 times TUG: 13 seconds BERG: 55/56   GAIT: Distance walked: 400 Assistive device utilized: None Level of assistance:  Indep  Comments: forward hip flex, dragging heels, no arm swing, slowed cadence and shortened step length  TODAY'S TREATMENT:        4/12  -DKTC- 30sec x2 (stopped due to pain -PPT in hooklying-5" x5 (reviewed for HEP) -manual HSS and piriformis stretch -LTR x10ea  -HEP review -Objective measures -Goals updated   Previous aquatic:                                                                                                                         Pt seen for aquatic therapy today.  Treatment took place in water 3.5-4.5 ft in depth at the [facility] pool. Temp of water was 92.  Pt entered/exited the pool via stairs using step to pattern with hand  rail.  *Walking forward, backwards, side stepping unsupported 4 ft *L stretch then with tail wagging *KB row 2 sets in ea position; wide stance, feet staggered x 15 rep * Noodle press using solid noodle x 10 reps ea position; wide stance; staggered stance *core engagement with rainbow HB submerged to sides: forward and backward amb x 4 widths each. Side stepping x 4 widths * using green hand bells oscillation in sagittal plane and frontal plane crossing midline keeping slow rhythmic movements alternated with quick (10 each 1-2 sets) core stabilized *Decompression using yellow noddle across chest anteriorly.  - cycling; add/abd hips and flex/ext  Pt requires the buoyancy and hydrostatic pressure of water for support, and to offload joints by  unweighting joint load by at least 50 % in navel deep water and by at least 75-80% in chest to neck deep water.  Viscosity of the water is needed for resistance of strengthening. Water current perturbations provides challenge to standing balance requiring increased core activation.   PATIENT EDUCATION:  Education details: aquatic progressions and modifications  Person educated: Patient Education method: Medical illustrator Education comprehension: verbalized understanding  HOME EXERCISE PROGRAM:  Land HEP:  Access Code: [email or web address] URL: [email or web address] Date: 02/14/2023 Prepared by: Riki Altes  Exercises - Supine Lower Trunk Rotation  - 1-2 x daily - 7 x weekly - 2 sets - 10 reps -  3 seconds hold - Supine Pelvic Tilt  - 1-2 x daily - 7 x weekly - 2 sets - 10 reps - 5seconds hold - Supine Double Knee to Chest Modified  - 1-2 x daily - 7 x weekly - 2-3 sets - 20seconds hold    Access Code: [email or web address] URL: [email or web address] Date: 01/07/2023 Prepared by: [facility] [address]  Exercises - Walking March  - 1 x daily - 7 x weekly - Side Stepping with Hand Floats  - 1 x daily - 7 x weekly - Seated Straddle on Flotation Forward Breast Stroke Arms and Bicycle Legs  - 1 x daily - 7 x weekly - 3 sets - 10 reps - Shoulder Extension with Resistance  - 1 x daily - 7 x weekly - 1-2 sets - 10 reps - Split Stance Shoulder Row with Resistance  - 1 x daily - 7 x weekly - 1-2 sets - 10 reps - Standing Hip Abduction  - 1 x daily - 7 x weekly - 1-2 sets - 10 reps - Standing Hamstring Stretch on Chair  - 2 x daily - 7 x weekly - 2 reps - 20 seconds  hold - Standing 'L' Stretch at [employer]  - 1 x daily - 7 x weekly - 2 reps  ASSESSMENT:  CLINICAL IMPRESSION: Updated objective measurements today at first land visit. Pt demonstrates significant improvement in BERG balance score, TUG test and sit to stand x30seconds compared to initial  eval. Has met 2/6 LTG and 1/5 STG She remains limited in lumbar flexion/extension due to low back tightness/pain but shows improvement in lateral flexion. Primary limitations include soft tissue restrictions in low back, poor endurance for standing/walking, and deficits in strength. Requires cues for gentle, small movements with exercises to decrease spasm/guarding. Felt relief from PPT. Provided pt with land HEP to start with. Educated her on appropriate exercise frequency and intensity. Pt will continue to benefit from PT to address these issues and improve function.        OBJECTIVE IMPAIRMENTS: Abnormal gait, decreased activity tolerance, decreased balance, decreased endurance, decreased knowledge of use of DME, decreased mobility, difficulty walking, decreased ROM, decreased strength, obesity, and pain.   ACTIVITY LIMITATIONS: carrying, lifting, bending, sitting, standing, squatting, sleeping, stairs, transfers, bed mobility, and locomotion level  PARTICIPATION LIMITATIONS: cleaning, laundry, shopping, community activity, and yard work  PERSONAL FACTORS: Fitness, Past/current experiences, and 1-2 comorbidities: HTN, ankle dysfunction  are also affecting patient's functional outcome.   REHAB POTENTIAL: Good  CLINICAL DECISION MAKING: Evolving/moderate complexity  EVALUATION COMPLEXITY: Moderate   GOALS: Goals reviewed with patient? Yes  SHORT TERM GOALS: Target date: 01/17/23  Pt will tolerate full aquatic sessions consistently without increase in pain and with improving function to demonstrate good toleration and effectiveness of intervention.   Baseline: Goal status: Met 01/17/23  2.  Pt will improve on 30S sit to stand test to >/= 7 (1/2 of norm value) to demonstrate improving mobility and strength Baseline: 2 Goal status: ongoing 01/21/23  3.  Pt will improve on lumbar ROM by 50% in all areas with decreasing pain Baseline: see chart Goal status: ongoing 02/14/23  4.  Pt will  report increased toleration to standing up towards 10 minutes to fix a meal. Baseline: <4 min  Goal status: ongoing 02/14/23 (max 10 mins)  5.  Pt will report an overall decrease in max/worst pain to <6/10 to improve sleep pattern and decrease physiological responses to pain Baseline: 8/10 Goal status:  ongoing 02/14/23 (8-9/10 due to burning since ablation.)     LONG TERM GOALS: Target date: 02/21/23  Pt to meet stated Foto Goal of 50 Baseline: 37% primary Goal status: IN PROGRESS  2.  Pt will be indep with final HEP's (land and aquatic as appropriate) for continued management of condition  Baseline:  Goal status: IN PROGRESS (received aquatic HEP)  3.  Pt will improve on Berg balance test to >/= 40/56 to demonstrate a decrease in fall risk.   Baseline: 26/56 Goal status: MET 02/14/23  4.  Pt will improve on Tug test to </=  12s to demonstrate improvement in lower extremity function, mobility and decreased fall risk.  Baseline: 20.49 Goal status: MET 4/12  5.  Pt will improve on strength of bilat hips at least 1 full grade  Baseline: see chart Goal status: IN PROGRESS   6.  Pt will be able to walk through grocery store unlimited by fatigue or pain pushing cart to demonstrate improvement in functional mobility. Baseline: >5 mins Goal status: IN PROGRESS Needs to hold onto cart. Goes to smaller stores. (02/14/23)  PLAN:  PT FREQUENCY: 1-2x/week  PT DURATION: other: 9 weeks  PLANNED INTERVENTIONS: Therapeutic exercises, Therapeutic activity, Neuromuscular re-education, Balance training, Gait training, Patient/Family education, Self Care, Joint mobilization, Joint manipulation, Stair training, Orthotic/Fit training, DME instructions, Aquatic Therapy, Dry Needling, Electrical stimulation, Spinal manipulation, Spinal mobilization, Cryotherapy, Moist heat, scar mobilization, Splintting, Taping, Traction, Ultrasound, Ionotophoresis /ml Dexamethasone, Manual therapy, and  Re-evaluation.  PLAN FOR NEXT SESSION: re-assess Aquatics: stretching and strengthening core, hips, LE. Balance and gait retraining, Posture retraining. Pain reduction.  Riki Altes, PTA  02/14/23 10:17 AM [facility] [address] [place], [place], [address] Phone: [phone]   Fax:  [phone]

## 2023-02-16 NOTE — Therapy (Signed)
OUTPATIENT PHYSICAL THERAPY THORACOLUMBAR TREATMENT      Patient Name: Haley White MRN: [id number] DOB:1956/10/01, 67 y.o., female Today's Date: 02/14/2023  END OF SESSION:  PT End of Session - 02/14/23 1006     Visit Number 12    Number of Visits 16    Date for PT Re-Evaluation 02/21/23    Authorization Type aetna medicare    Progress Note Due on Visit 20    Activity Tolerance Patient tolerated treatment well    Behavior During Therapy [facility] for tasks assessed/performed              Past Medical History:  Diagnosis Date   Anxiety    Arthritis    Asthma    Cataract    Gout    Hyperlipidemia    Hypertension    Obese    Past Surgical History:  Procedure Laterality Date   APPENDECTOMY  1984   CATARACT EXTRACTION W/ INTRAOCULAR LENS  IMPLANT, BILATERAL  2014   CESAREAN SECTION  1984, 1987, 1994   X3   COLONOSCOPY  11/16/2013   w/Brodie    FOOT SURGERY Left 2002   KNEE SURGERY Left 1998   ROTATOR CUFF REPAIR Right 2007   TUBAL LIGATION  1994   Patient Active Problem List   Diagnosis Date Noted   DOE (dyspnea on exertion) 05/10/2022   Acute pulmonary embolism 05/10/2022   Allergic rhinitis 03/15/2021   Gastro-esophageal reflux disease without esophagitis 03/15/2021   Mild persistent asthma, uncomplicated 03/15/2021   Aortic atherosclerosis (HCC) by Chest CT scan on 02/02/2018  01/10/2021   Asthma    Low back pain 03/17/2019   Lumbosacral spondylosis without myelopathy 03/17/2019   Cubital tunnel syndrome 10/14/2018   Closed fracture of distal end of right radius 07/09/2018   Pseudogout 12/09/2017   Major depressive disorder, recurrent, severe without psychotic features    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  ONSET DATE: >9yr  SUBJECTIVE:  SUBJECTIVE STATEMENT: Pt reports a "burning" sensation in L buttock area since ablation. "It's a normal side effect that happens sometimes, but it's really strong."  PERTINENT HISTORY:  Patient is a very nice 67 yo div WF  with   HTN, HLD, Prediabetes  and Vitamin D Deficiency and had  hx/o recent Covid PNA~ May 2023  and  and in July 2023 was discovered to have a Rt sided PE and was hospitalized for Heparin & transitioned to Eliquis ( to continue 6 months  thru Jan 7 ) .    She was seen 5-6 weeks ago for evaluation for c/o  "shaking & forgetfulness " .  She reported poor short  & long term memory recall.  (Pt discontinued meds as per MD and reports back to baseline with memory)  PAIN:  Are you having pain? Yes: NPRS scale: 2-3/10 Pain location: Low lumbar area across hip to hip Pain description: ache, dull  Aggravating factors: standing <4 mins; walking >5 mins Relieving factors: Aspen lumbar brace; tylenol, ms relaxer  PRECAUTIONS: Back and Fall  WEIGHT BEARING RESTRICTIONS: No  FALLS:  Has patient fallen in last 6 months? No  LIVING ENVIRONMENT: Lives with: lives alone Lives in: House/apartment Stairs: Yes: External: 3 steps; none Has following equipment at home: Single point cane  OCCUPATION: retired  PLOF: Independent  PATIENT GOALS: less pain, be able to stand and walk longer, get strength back  NEXT MD VISIT:  OBJECTIVE:   BERG Balance Test           Date: 4/12  Sit to Stand 4  Standing unsupported 4  Sitting with back unsupported but feet supported 4  Stand to sit  4  Transfers  4  Standing unsupported with eyes closed 4  Standing unsupported feet together 4  From standing position, reach forward with outstretched arm 4  From standing position, pick up object from floor 4  From standing position, turn and look behind over each shoulder 4  Turn 360 4  Standing unsupported, alternately place foot on step 4  Standing unsupported, one foot in front 4  Standing on one leg 3  Total:  55/56     DIAGNOSTIC FINDINGS:  Pt [facility] but no results in Epic  CLINICAL DATA: foot pain, soft tissue mass of left third toe US IMPRESSION: Focal tenosynovitis of an extensor tendon in the dorsal foot, with adjacent soft tissue swelling.    PATIENT SURVEYS:  FOTO 37 primary measure with goal of 50% on 12th visit 01/07/23:  42%  02/14/23: 40%  SCREENING FOR RED FLAGS:  Neg  COGNITION: Overall cognitive status: Within functional limits for tasks assessed     SENSATION: WFL Occasional P&N bilateral hips when back pain is increased  MUSCLE LENGTH: Hamstrings: Right 80 deg; Left 85 deg   POSTURE: rounded shoulders, anterior pelvic tilt, and flexed trunk   PALPATION: TTP moderately sensitive through lower lumbar and sacral area    LUMBAR ROM:   AROM eval 01/21/23 4/12  Flexion Finger tips to knees To knees To knees  Extension 25% 50% 50%P!  Right lateral flexion 10% 25% 75%  Left lateral flexion 25% 50% 65%  Right rotation 50% 50% 75%  Left rotation 50% 50% 50%   All movements limited by PAIN  LOWER EXTREMITY ROM:     WFL  LOWER EXTREMITY MMT:        P!=Pain MMT Right eval Left eval 4/12 L/R  Hip flexion 3+P!  3+P! Tested supine 4-P!/4 Tested seated  Hip extension 3+P! 3+P!   Hip abduction 5 5   Hip adduction 5 5   Hip internal rotation     Hip external rotation     Knee flexion 5 5    Knee extension     Ankle dorsiflexion 5 5   Ankle plantarflexion     Ankle inversion     Ankle eversion      (Blank rows = not tested)  LUMBAR SPECIAL TESTS:  Straight leg raise test: Positive, Slump test: Negative, and Thomas test: Negative  FUNCTIONAL TESTS:  30s sit to stand: 2 Timed up and go (TUG): 20.49 Berg Balance Scale: 26/56  4/12: 30s STS: 9 times TUG: 13 seconds BERG: 55/56   GAIT: Distance walked: 400 Assistive device utilized: None Level of assistance:  Indep  Comments: forward hip flex, dragging heels, no arm swing, slowed cadence and shortened step length  TODAY'S TREATMENT:        4/12  -DKTC- 30sec x2 (stopped due to pain -PPT in hooklying-5" x5 (reviewed for HEP) -manual HSS and piriformis stretch -LTR x10ea  -HEP review -Objective measures -Goals updated   Previous aquatic:                                                                                                                         Pt seen for aquatic therapy today.  Treatment took place in water 3.5-4.5 ft in depth at the [facility] pool. Temp of water was 92.  Pt entered/exited the pool via stairs using step to pattern with hand  rail.  *Walking forward, backwards, side stepping unsupported 4 ft *L stretch then with tail wagging *KB row 2 sets in ea position; wide stance, feet staggered x 15 rep * Noodle press using solid noodle x 10 reps ea position; wide stance; staggered stance *core engagement with rainbow HB submerged to sides: forward and backward amb x 4 widths each. Side stepping x 4 widths * using green hand bells oscillation in sagittal plane and frontal plane crossing midline keeping slow rhythmic movements alternated with quick (10 each 1-2 sets) core stabilized *Decompression using yellow noddle across chest anteriorly.  - cycling; add/abd hips and flex/ext  Pt requires the buoyancy and hydrostatic pressure of water for support, and to offload joints by  unweighting joint load by at least 50 % in navel deep water and by at least 75-80% in chest to neck deep water.  Viscosity of the water is needed for resistance of strengthening. Water current perturbations provides challenge to standing balance requiring increased core activation.   PATIENT EDUCATION:  Education details: aquatic progressions and modifications  Person educated: Patient Education method: Medical illustrator Education comprehension: verbalized understanding  HOME EXERCISE PROGRAM:  Land HEP:  Access Code: [email or web address] URL: [email or web address] Date: 02/14/2023 Prepared by: Riki Altes  Exercises - Supine Lower Trunk Rotation  - 1-2 x daily - 7 x weekly - 2 sets - 10 reps -  3 seconds hold - Supine Pelvic Tilt  - 1-2 x daily - 7 x weekly - 2 sets - 10 reps - 5seconds hold - Supine Double Knee to Chest Modified  - 1-2 x daily - 7 x weekly - 2-3 sets - 20seconds hold    Access Code: [email or web address] URL: [email or web address] Date: 01/07/2023 Prepared by: [facility] [address]  Exercises - Walking March  - 1 x daily - 7 x weekly - Side Stepping with Hand Floats  - 1 x daily - 7 x weekly - Seated Straddle on Flotation Forward Breast Stroke Arms and Bicycle Legs  - 1 x daily - 7 x weekly - 3 sets - 10 reps - Shoulder Extension with Resistance  - 1 x daily - 7 x weekly - 1-2 sets - 10 reps - Split Stance Shoulder Row with Resistance  - 1 x daily - 7 x weekly - 1-2 sets - 10 reps - Standing Hip Abduction  - 1 x daily - 7 x weekly - 1-2 sets - 10 reps - Standing Hamstring Stretch on Chair  - 2 x daily - 7 x weekly - 2 reps - 20 seconds  hold - Standing 'L' Stretch at [employer]  - 1 x daily - 7 x weekly - 2 reps  ASSESSMENT:  CLINICAL IMPRESSION: *Last aquatic session  Updated objective measurements today at first land visit. Pt demonstrates significant improvement in BERG balance score, TUG test and sit to stand  x30seconds compared to initial eval. Has met 2/6 LTG and 1/5 STG She remains limited in lumbar flexion/extension due to low back tightness/pain but shows improvement in lateral flexion. Primary limitations include soft tissue restrictions in low back, poor endurance for standing/walking, and deficits in strength. Requires cues for gentle, small movements with exercises to decrease spasm/guarding. Felt relief from PPT. Provided pt with land HEP to start with. Educated her on appropriate exercise frequency and intensity. Pt will continue to benefit from PT to address these issues and improve function.        OBJECTIVE IMPAIRMENTS: Abnormal gait, decreased activity tolerance, decreased balance, decreased endurance, decreased knowledge of use of DME, decreased mobility, difficulty walking, decreased ROM, decreased strength, obesity, and pain.   ACTIVITY LIMITATIONS: carrying, lifting, bending, sitting, standing, squatting, sleeping, stairs, transfers, bed mobility, and locomotion level  PARTICIPATION LIMITATIONS: cleaning, laundry, shopping, community activity, and yard work  PERSONAL FACTORS: Fitness, Past/current experiences, and 1-2 comorbidities: HTN, ankle dysfunction  are also affecting patient's functional outcome.   REHAB POTENTIAL: Good  CLINICAL DECISION MAKING: Evolving/moderate complexity  EVALUATION COMPLEXITY: Moderate   GOALS: Goals reviewed with patient? Yes  SHORT TERM GOALS: Target date: 01/17/23  Pt will tolerate full aquatic sessions consistently without increase in pain and with improving function to demonstrate good toleration and effectiveness of intervention.   Baseline: Goal status: Met 01/17/23  2.  Pt will improve on 30S sit to stand test to >/= 7 (1/2 of norm value) to demonstrate improving mobility and strength Baseline: 2 Goal status: ongoing 01/21/23  3.  Pt will improve on lumbar ROM by 50% in all areas with decreasing pain Baseline: see chart Goal status:  ongoing 02/14/23  4.  Pt will report increased toleration to standing up towards 10 minutes to fix a meal. Baseline: <4 min  Goal status: ongoing 02/14/23 (max 10 mins)  5.  Pt will report an overall decrease in max/worst pain to <6/10 to improve sleep pattern and decrease physiological responses to pain  Baseline: 8/10 Goal status: ongoing 02/14/23 (8-9/10 due to burning since ablation.)     LONG TERM GOALS: Target date: 02/21/23  Pt to meet stated Foto Goal of 50 Baseline: 37% primary Goal status: IN PROGRESS  2.  Pt will be indep with final HEP's (land and aquatic as appropriate) for continued management of condition  Baseline:  Goal status: IN PROGRESS (received aquatic HEP)  3.  Pt will improve on Berg balance test to >/= 40/56 to demonstrate a decrease in fall risk.   Baseline: 26/56 Goal status: MET 02/14/23  4.  Pt will improve on Tug test to </=  12s to demonstrate improvement in lower extremity function, mobility and decreased fall risk.  Baseline: 20.49 Goal status: MET 4/12  5.  Pt will improve on strength of bilat hips at least 1 full grade  Baseline: see chart Goal status: IN PROGRESS   6.  Pt will be able to walk through grocery store unlimited by fatigue or pain pushing cart to demonstrate improvement in functional mobility. Baseline: >5 mins Goal status: IN PROGRESS Needs to hold onto cart. Goes to smaller stores. (02/14/23)  PLAN:  PT FREQUENCY: 1-2x/week  PT DURATION: other: 9 weeks  PLANNED INTERVENTIONS: Therapeutic exercises, Therapeutic activity, Neuromuscular re-education, Balance training, Gait training, Patient/Family education, Self Care, Joint mobilization, Joint manipulation, Stair training, Orthotic/Fit training, DME instructions, Aquatic Therapy, Dry Needling, Electrical stimulation, Spinal manipulation, Spinal mobilization, Cryotherapy, Moist heat, scar mobilization, Splintting, Taping, Traction, Ultrasound, Ionotophoresis 4mg /ml Dexamethasone,  Manual therapy, and Re-evaluation.  PLAN FOR NEXT SESSION: re-assess Aquatics: stretching and strengthening core, hips, LE. Balance and gait retraining, Posture retraining. Pain reduction.  Riki Altes, PTA  02/14/23 10:17 AM [facility] [address] [place], [place], [address] Phone: [phone]   Fax:  [phone]

## 2023-02-18 ENCOUNTER — Ambulatory Visit (HOSPITAL_BASED_OUTPATIENT_CLINIC_OR_DEPARTMENT_OTHER): Payer: Medicare HMO | Admitting: Physical Therapy

## 2023-02-18 ENCOUNTER — Encounter (HOSPITAL_BASED_OUTPATIENT_CLINIC_OR_DEPARTMENT_OTHER): Payer: Self-pay | Admitting: Physical Therapy

## 2023-02-18 DIAGNOSIS — M47896 Other spondylosis, lumbar region: Secondary | ICD-10-CM | POA: Diagnosis not present

## 2023-02-18 DIAGNOSIS — R2689 Other abnormalities of gait and mobility: Secondary | ICD-10-CM

## 2023-02-18 DIAGNOSIS — M5459 Other low back pain: Secondary | ICD-10-CM | POA: Diagnosis not present

## 2023-02-18 DIAGNOSIS — M792 Neuralgia and neuritis, unspecified: Secondary | ICD-10-CM | POA: Diagnosis not present

## 2023-02-18 DIAGNOSIS — R293 Abnormal posture: Secondary | ICD-10-CM | POA: Diagnosis not present

## 2023-02-18 DIAGNOSIS — M6281 Muscle weakness (generalized): Secondary | ICD-10-CM

## 2023-02-19 ENCOUNTER — Other Ambulatory Visit: Payer: Self-pay | Admitting: Internal Medicine

## 2023-02-19 ENCOUNTER — Encounter: Payer: Self-pay | Admitting: Internal Medicine

## 2023-02-19 DIAGNOSIS — R0683 Snoring: Secondary | ICD-10-CM

## 2023-02-19 DIAGNOSIS — G478 Other sleep disorders: Secondary | ICD-10-CM

## 2023-02-19 DIAGNOSIS — G4734 Idiopathic sleep related nonobstructive alveolar hypoventilation: Secondary | ICD-10-CM

## 2023-02-20 DIAGNOSIS — J301 Allergic rhinitis due to pollen: Secondary | ICD-10-CM | POA: Diagnosis not present

## 2023-02-20 DIAGNOSIS — J3081 Allergic rhinitis due to animal (cat) (dog) hair and dander: Secondary | ICD-10-CM | POA: Diagnosis not present

## 2023-02-20 DIAGNOSIS — J3089 Other allergic rhinitis: Secondary | ICD-10-CM | POA: Diagnosis not present

## 2023-02-21 ENCOUNTER — Encounter (HOSPITAL_BASED_OUTPATIENT_CLINIC_OR_DEPARTMENT_OTHER): Payer: Self-pay | Admitting: Physical Therapy

## 2023-02-21 ENCOUNTER — Ambulatory Visit (HOSPITAL_BASED_OUTPATIENT_CLINIC_OR_DEPARTMENT_OTHER): Payer: Medicare HMO | Admitting: Physical Therapy

## 2023-02-21 DIAGNOSIS — R2689 Other abnormalities of gait and mobility: Secondary | ICD-10-CM

## 2023-02-21 DIAGNOSIS — R293 Abnormal posture: Secondary | ICD-10-CM | POA: Diagnosis not present

## 2023-02-21 DIAGNOSIS — M5459 Other low back pain: Secondary | ICD-10-CM | POA: Diagnosis not present

## 2023-02-21 DIAGNOSIS — M6281 Muscle weakness (generalized): Secondary | ICD-10-CM

## 2023-02-21 NOTE — Therapy (Signed)
OUTPATIENT PHYSICAL THERAPY THORACOLUMBAR  TREATMENT      Patient Name: Haley White MRN: [id number] DOB:1956-05-15, 67 y.o., female Today's Date: 02/22/2023  END OF SESSION:  PT End of Session - 02/21/23 1046     Visit Number 14    Number of Visits 21    Date for PT Re-Evaluation 03/18/23    Authorization Type aetna medicare    PT Start Time 1016    PT Stop Time 1100    PT Time Calculation (min) 44 min    Activity Tolerance Patient tolerated treatment well    Behavior During Therapy WFL for tasks assessed/performed               Past Medical History:  Diagnosis Date   Anxiety    Arthritis    Asthma    Cataract    Gout    Hyperlipidemia    Hypertension    Obese    Past Surgical History:  Procedure Laterality Date   APPENDECTOMY  1984   CATARACT EXTRACTION W/ INTRAOCULAR LENS  IMPLANT, BILATERAL  2014   CESAREAN SECTION  1984, 1987, 1994   X3   COLONOSCOPY  11/16/2013   w/Brodie    FOOT SURGERY Left 2002   KNEE SURGERY Left 1998   ROTATOR CUFF REPAIR Right 2007   TUBAL LIGATION  1994   Patient Active Problem List   Diagnosis Date Noted   DOE (dyspnea on exertion) 05/10/2022   Acute pulmonary embolism 05/10/2022   Allergic rhinitis 03/15/2021   Gastro-esophageal reflux disease without esophagitis 03/15/2021   Mild persistent asthma, uncomplicated 03/15/2021   Aortic atherosclerosis (HCC) by Chest CT scan on 02/02/2018  01/10/2021   Asthma    Low back pain 03/17/2019   Lumbosacral spondylosis without myelopathy 03/17/2019   Cubital tunnel syndrome 10/14/2018   Closed fracture of distal end of right radius 07/09/2018   Pseudogout 12/09/2017   Major depressive disorder, recurrent, severe without psychotic features    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   Suicide attempt by drug ingestion    Medication management 08/22/2014   Vitamin D deficiency 08/22/2014   Hyperlipidemia, mixed 08/22/2014   Hypertension    Anxiety    Abnormal glucose     Class 2 severe obesity due to excess calories with serious comorbidity and body mass index (BMI) of 37.0 to 37.9 in adult     PCP: Lucky Cowboy, MD   REFERRING PROVIDER: Jene Every, MD   REFERRING DIAG: M54.51 (ICD-10-CM) - Vertebrogenic low back pain   Rationale for Evaluation and Treatment: Rehabilitation  THERAPY DIAG:  Other low back pain  Muscle weakness (generalized)  Other abnormalities of gait and mobility  ONSET DATE: >107yr  SUBJECTIVE:  SUBJECTIVE STATEMENT: Pt reports she is improving and heading in the right direction.  Pt had a lumbar ablation 2-3 weeks ago and reports improved sx's.  She continues to have a  "burning" sensation in L buttock since the ablation.  She has spoken to MD.  Pt denies any adverse effects after prior Rx.  Pt is a member at [employer] and finished her aquatic therapy.  She has an aquatic HEP.  Pt has been performing PPT and her L stretch at home.  Pt reports improved standing duration though continues to have pain with standing.  Pt has pain with bending.    Pt reports she was told she has some muscle atrophy in her lower back after she had a CT scan/MRI a couple of months ago.   PERTINENT HISTORY:  Patient is a very nice 67 yo div WF  with   HTN, HLD, Prediabetes  and Vitamin D Deficiency and had  hx/o recent Covid PNA~ May 2023  and  and in July 2023 was discovered to have a Rt sided PE and was hospitalized for Heparin & transitioned to Eliquis ( to continue 6 months  thru Jan 7 ) .    She was seen 5-6 weeks ago for evaluation for c/o  "shaking & forgetfulness " .  She reported poor short  & long term memory recall.  (Pt discontinued meds as per MD and reports back to baseline with memory)  PAIN:  Are you having pain? Yes: NPRS scale: 2/10 "burning pain" in L  glute.  0/10 pain in lumbar   Pain location: Low lumbar area across hip to hip Pain description: ache, dull  Aggravating factors: standing <4 mins; walking >5 mins Relieving factors: Aspen lumbar brace; tylenol, ms relaxer  PRECAUTIONS: Back and Fall  WEIGHT BEARING RESTRICTIONS: No  FALLS:  Has patient fallen in last 6 months? No  LIVING ENVIRONMENT: Lives with: lives alone Lives in: House/apartment Stairs: Yes: External: 3 steps; none Has following equipment at home: Single point cane  OCCUPATION: retired  PLOF: Independent  PATIENT GOALS: less pain, be able to stand and walk longer, get strength back  NEXT MD VISIT:  OBJECTIVE:     DIAGNOSTIC FINDINGS:  Pt reports [facility] but no results in Epic  CLINICAL DATA: foot pain, soft tissue mass of left third toe US IMPRESSION: Focal tenosynovitis of an extensor tendon in the dorsal foot, with adjacent soft tissue swelling.    TODAY'S TREATMENT:        -Reviewed current function, pt presentation, pain levels, and HEP compliance. -Pt performed: PPT 2x10 LTR 2x10 Supine marching with PPT 2x10 Pt received education and instruction in performing TrA contractions and with palpation of TrA.  Pt performed TrA contractions. Supine clams with RTB with TrA x10  Supine heel slides with TrA Supie manual HSS 2x30 sec bilat  -PT reviewed HEP and updated HEP.  PT gave pt a HEP handout and educated pt in correct form and appropriate frequency.  PT instructed pt she should not have pain with HEP.    PATIENT EDUCATION:  Education details: exercise form, HEP and POC Person educated: Patient Education method: Medical illustrator Education comprehension: verbalized understanding  HOME EXERCISE PROGRAM:  Land HEP:  Access Code: [email or web address] URL: [email or web address] Date: 02/14/2023 Prepared by: Riki Altes  Exercises - Supine Lower Trunk Rotation  - 1-2 x daily - 7 x weekly - 2 sets  - 10 reps - 3 seconds hold - Supine Pelvic  Tilt  - 1-2 x daily - 7 x weekly - 2 sets - 10 reps - 5seconds hold - Supine Double Knee to Chest Modified  - 1-2 x daily - 7 x weekly - 2-3 sets - 20seconds hold    Access Code: [email or web address] URL: [email or web address] Date: 01/07/2023 Prepared by: [facility] - Outpatient Re[facility] - [address]  Exercises - Walking March  - 1 x daily - 7 x weekly - Side Stepping with Hand Floats  - 1 x daily - 7 x weekly - Seated Straddle on Flotation Forward Breast Stroke Arms and Bicycle Legs  - 1 x daily - 7 x weekly - 3 sets - 10 reps - Shoulder Extension with Resistance  - 1 x daily - 7 x weekly - 1-2 sets - 10 reps - Split Stance Shoulder Row with Resistance  - 1 x daily - 7 x weekly - 1-2 sets - 10 reps - Standing Hip Abduction  - 1 x daily - 7 x weekly - 1-2 sets - 10 reps - Standing Hamstring Stretch on Chair  - 2 x daily - 7 x weekly - 2 reps - 20 seconds  hold - Standing 'L' Stretch at [employer]  - 1 x daily - 7 x weekly - 2 reps  Updated HEP: - Supine March  - 1-2 x daily - 7 x weekly - 2 sets - 10 reps - Supine Transversus Abdominis Bracing - Hands on Stomach  - 2 x daily - 7 x weekly - 2 sets - 10 reps - Supine Core Control with Heel Slide  - 1 x daily - 7 x weekly - 2 sets - 10 reps  ASSESSMENT:  CLINICAL IMPRESSION: Pt presents to PT today for land based treatment.  She reports improved pain since the lumbar ablation though cont to have the burning pain in L glute which is MD is aware of.  PT reviewed her current HEP and progressed her land based PT with core strengthening.  PT instructed pt in TrA contractions and further core strengthening.  She performed exercises well with cuing and instruction in correct form.  Pt had good tolerance with land based table exercises and reports the PPT and supine heel slides with TrA felt really good.  PT updated HEP and gave pt a HEP handout.  Pt demonstrates good understanding of HEP.  She responded well  to Rx stating she felt good after Rx and actually felt better after Rx than before Rx.        OBJECTIVE IMPAIRMENTS: Abnormal gait, decreased activity tolerance, decreased balance, decreased endurance, decreased knowledge of use of DME, decreased mobility, difficulty walking, decreased ROM, decreased strength, obesity, and pain.   ACTIVITY LIMITATIONS: carrying, lifting, bending, sitting, standing, squatting, sleeping, stairs, transfers, bed mobility, and locomotion level  PARTICIPATION LIMITATIONS: cleaning, laundry, shopping, community activity, and yard work  PERSONAL FACTORS: Fitness, Past/current experiences, and 1-2 comorbidities: HTN, ankle dysfunction  are also affecting patient's functional outcome.   REHAB POTENTIAL: Good  CLINICAL DECISION MAKING: Evolving/moderate complexity  EVALUATION COMPLEXITY: Moderate   GOALS: Goals reviewed with patient? Yes  SHORT TERM GOALS: Target date: 01/17/23  Pt will tolerate full aquatic sessions consistently without increase in pain and with improving function to demonstrate good toleration and effectiveness of intervention.   Baseline: Goal status: Met 01/17/23  2.  Pt will improve on 30S sit to stand test to >/= 7 (1/2 of norm value) to demonstrate improving mobility and strength Baseline: 2 Goal status: ongoing 01/21/23  3.  Pt will improve on lumbar ROM by 50% in all areas with decreasing pain Baseline: see chart Goal status: ongoing 02/14/23  4.  Pt will report increased toleration to standing up towards 10 minutes to fix a meal. Baseline: <4 min  Goal status: ongoing 02/14/23 (max 10 mins) Met 02/18/23  5.  Pt will report an overall decrease in max/worst pain to <6/10 to improve sleep pattern and decrease physiological responses to pain Baseline: 8/10 Goal status: ongoing 02/14/23 (8-9/10 due to burning since ablation.)     LONG TERM GOALS: Target date: 02/21/23  Pt to meet stated Foto Goal of 50 Baseline: 37% primary Goal  status: Met 02/18/23  2.  Pt will be indep with final HEP's (land and aquatic as appropriate) for continued management of condition  Baseline:  Goal status: IN PROGRESS (received aquatic HEP)  3.  Pt will improve on Berg balance test to >/= 40/56 to demonstrate a decrease in fall risk.   Baseline: 26/56 Goal status: MET 02/14/23  4.  Pt will improve on Tug test to </=  12s to demonstrate improvement in lower extremity function, mobility and decreased fall risk.  Baseline: 20.49 Goal status: MET 4/12  5.  Pt will improve on strength of bilat hips at least 1 full grade  Baseline: see chart Goal status: IN PROGRESS   6.  Pt will be able to walk through grocery store unlimited by fatigue or pain pushing cart to demonstrate improvement in functional mobility. Baseline: >5 mins Goal status: IN PROGRESS Needs to hold onto cart. Goes to smaller stores. (02/14/23)  PLAN:  PT FREQUENCY: 1-2x/week  PT DURATION: 4 weeks  PLANNED INTERVENTIONS: Therapeutic exercises, Therapeutic activity, Neuromuscular re-education, Balance training, Gait training, Patient/Family education, Self Care, Joint mobilization, Joint manipulation, Stair training, Orthotic/Fit training, DME instructions, Aquatic Therapy, Dry Needling, Electrical stimulation, Spinal manipulation, Spinal mobilization, Cryotherapy, Moist heat, scar mobilization, Splintting, Taping, Traction, Ultrasound, Ionotophoresis /ml Dexamethasone, Manual therapy, and Re-evaluation.  PLAN FOR NEXT SESSION: continued land based intervention as deemed appropriate by land based PT. Aquatics discontinued.  Audie Clear III PT, DPT 02/22/23 9:17 AM

## 2023-02-23 DIAGNOSIS — F331 Major depressive disorder, recurrent, moderate: Secondary | ICD-10-CM | POA: Diagnosis not present

## 2023-02-23 DIAGNOSIS — R69 Illness, unspecified: Secondary | ICD-10-CM | POA: Diagnosis not present

## 2023-02-23 DIAGNOSIS — F411 Generalized anxiety disorder: Secondary | ICD-10-CM | POA: Diagnosis not present

## 2023-02-23 DIAGNOSIS — Z62820 Parent-biological child conflict: Secondary | ICD-10-CM | POA: Diagnosis not present

## 2023-02-25 ENCOUNTER — Encounter (HOSPITAL_BASED_OUTPATIENT_CLINIC_OR_DEPARTMENT_OTHER): Payer: Self-pay | Admitting: Physical Therapy

## 2023-02-25 ENCOUNTER — Ambulatory Visit (HOSPITAL_BASED_OUTPATIENT_CLINIC_OR_DEPARTMENT_OTHER): Payer: Medicare HMO | Admitting: Physical Therapy

## 2023-02-25 DIAGNOSIS — R2689 Other abnormalities of gait and mobility: Secondary | ICD-10-CM | POA: Diagnosis not present

## 2023-02-25 DIAGNOSIS — M5459 Other low back pain: Secondary | ICD-10-CM

## 2023-02-25 DIAGNOSIS — R293 Abnormal posture: Secondary | ICD-10-CM | POA: Diagnosis not present

## 2023-02-25 DIAGNOSIS — M6281 Muscle weakness (generalized): Secondary | ICD-10-CM

## 2023-02-25 NOTE — Therapy (Signed)
OUTPATIENT PHYSICAL THERAPY THORACOLUMBAR  TREATMENT      Patient Name: Haley White MRN: [id number] DOB:[birth year], 67 y.o., female Today's Date: 02/26/2023  END OF SESSION:  PT End of Session - 02/25/23 1031     Visit Number 15    Number of Visits 21    Date for PT Re-Evaluation 03/18/23    Authorization Type aetna medicare    PT Start Time 1025    PT Stop Time 1104    PT Time Calculation (min) 39 min    Activity Tolerance Patient tolerated treatment well    Behavior During Therapy WFL for tasks assessed/performed               Past Medical History:  Diagnosis Date   Anxiety    Arthritis    Asthma    Cataract    Gout    Hyperlipidemia    Hypertension    Obese    Past Surgical History:  Procedure Laterality Date   APPENDECTOMY  1984   CATARACT EXTRACTION W/ INTRAOCULAR LENS  IMPLANT, BILATERAL  2014   CESAREAN SECTION  1984, 1987, 1994   X3   COLONOSCOPY  11/16/2013   w/Brodie    FOOT SURGERY Left 2002   KNEE SURGERY Left 1998   ROTATOR CUFF REPAIR Right 2007   TUBAL LIGATION  1994   Patient Active Problem List   Diagnosis Date Noted   DOE (dyspnea on exertion) 05/10/2022   Acute pulmonary embolism 05/10/2022   Allergic rhinitis 03/15/2021   Gastro-esophageal reflux disease without esophagitis 03/15/2021   Mild persistent asthma, uncomplicated 03/15/2021   Aortic atherosclerosis (HCC) by Chest CT scan on 02/02/2018  01/10/2021   Asthma    Low back pain 03/17/2019   Lumbosacral spondylosis without myelopathy 03/17/2019   Cubital tunnel syndrome 10/14/2018   Closed fracture of distal end of right radius 07/09/2018   Pseudogout 12/09/2017   Major depressive disorder, recurrent, severe without psychotic features    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   Suicide attempt by drug ingestion    Medication management 08/22/2014   Vitamin D deficiency 08/22/2014   Hyperlipidemia, mixed 08/22/2014   Hypertension    Anxiety    Abnormal glucose     Class 2 severe obesity due to excess calories with serious comorbidity and body mass index (BMI) of 37.0 to 37.9 in adult     PCP: Lucky Cowboy, MD   REFERRING PROVIDER: Jene Every, MD   REFERRING DIAG: M54.51 (ICD-10-CM) - Vertebrogenic low back pain   Rationale for Evaluation and Treatment: Rehabilitation  THERAPY DIAG:  Other low back pain  Muscle weakness (generalized)  Other abnormalities of gait and mobility  ONSET DATE: >23yr  SUBJECTIVE:  SUBJECTIVE STATEMENT: Pt states she did too much yesterday.  She tried to walk in [employer] instead of riding the motorized w/c.  She made it thru about the half of the store and had significant pain.  Her daughter went to get the motorized w/c for her to use.  Pt states her back is still hurting today from trying to walk in [employer] yesterday.  Pt denies any adverse effects after prior Rx.  Pt has been performing her HEP without any adverse effects.  Pt states at times she feels like her back is going to give out when she walks.     PERTINENT HISTORY:  Patient is a very nice 67 yo div WF  with   HTN, HLD, Prediabetes  and Vitamin D Deficiency and had  hx/o recent Covid PNA~ May 2023  and  and in July 2023 was discovered to have a Rt sided PE and was hospitalized for Heparin & transitioned to Eliquis ( to continue 6 months  thru Jan 7 ) .    She was seen 5-6 weeks ago for evaluation for c/o  "shaking & forgetfulness " .  She reported poor short  & long term memory recall.  (Pt discontinued meds as per MD and reports back to baseline with memory)  PAIN:  Are you having pain? Yes: NPRS scale: 6-7/10 pain in lumbar   Pain location:  bilat sides of lumbar Pain description: ache, dull  Aggravating factors: standing <4 mins; walking >5 mins Relieving  factors: Aspen lumbar brace; tylenol, ms relaxer  PRECAUTIONS: Back and Fall  WEIGHT BEARING RESTRICTIONS: No  FALLS:  Has patient fallen in last 6 months? No  LIVING ENVIRONMENT: Lives with: lives alone Lives in: House/apartment Stairs: Yes: External: 3 steps; none Has following equipment at home: Single point cane  OCCUPATION: retired  PLOF: Independent  PATIENT GOALS: less pain, be able to stand and walk longer, get strength back  NEXT MD VISIT:  OBJECTIVE:     DIAGNOSTIC FINDINGS:  Pt reports [facility] at [facility] but no results in Epic  CLINICAL DATA: foot pain, soft tissue mass of left third toe US IMPRESSION: Focal tenosynovitis of an extensor tendon in the dorsal foot, with adjacent soft tissue swelling.    TODAY'S TREATMENT:        Therapeutic Exercise: -Reviewed current function, pt presentation, pain levels, and HEP compliance. -Pt performed: LTR 2x10 Supine marching with PPT 2x10 Pt received education and instruction in performing TrA contractions and with palpation of TrA.  Pt performed TrA contractions. Supine clams with RTB with TrA x10  Supine heel slides with TrA Supie manual HSS 2x30 sec bilat  Manual Therapy: Pt received STM to bilat lumbar paraspinals in prone to improve soft tissue tightness and mobility and reduce pain.   PATIENT EDUCATION:  Education details: exercise form, HEP and POC Person educated: Patient Education method: Medical illustrator Education comprehension: verbalized understanding  HOME EXERCISE PROGRAM:  Land HEP:  Access Code: [email or web address] URL: [email or web address] Date: 02/14/2023 Prepared by: Riki Altes  Exercises - Supine Lower Trunk Rotation  - 1-2 x daily - 7 x weekly - 2 sets - 10 reps - 3 seconds hold - Supine Pelvic Tilt  - 1-2 x daily - 7 x weekly - 2 sets - 10 reps - 5seconds hold - Supine Double Knee to Chest Modified  - 1-2 x daily - 7 x weekly - 2-3 sets - 20seconds  hold    Access Code: [email or web address]  URL: [email or web address] Date: 01/07/2023 Prepared by: [facility] - Outpatient Rehab - [address]  Exercises - Walking March  - 1 x daily - 7 x weekly - Side Stepping with Hand Floats  - 1 x daily - 7 x weekly - Seated Straddle on Flotation Forward Breast Stroke Arms and Bicycle Legs  - 1 x daily - 7 x weekly - 3 sets - 10 reps - Shoulder Extension with Resistance  - 1 x daily - 7 x weekly - 1-2 sets - 10 reps - Split Stance Shoulder Row with Resistance  - 1 x daily - 7 x weekly - 1-2 sets - 10 reps - Standing Hip Abduction  - 1 x daily - 7 x weekly - 1-2 sets - 10 reps - Standing Hamstring Stretch on Chair  - 2 x daily - 7 x weekly - 2 reps - 20 seconds  hold - Standing 'L' Stretch at [employer]  - 1 x daily - 7 x weekly - 2 reps  Updated HEP: - Supine March  - 1-2 x daily - 7 x weekly - 2 sets - 10 reps - Supine Transversus Abdominis Bracing - Hands on Stomach  - 2 x daily - 7 x weekly - 2 sets - 10 reps - Supine Core Control with Heel Slide  - 1 x daily - 7 x weekly - 2 sets - 10 reps  ASSESSMENT:  CLINICAL IMPRESSION: Pt presents to Rx today having increased lumbar pain.  She tried to walk in [employer] yesterday and had significant pain.  She had to use the motorized W/C due to the pain.  Pt requires instruction for correct form with TrA contraction.  She performed exercises well with cuing for correct form.  PT performed STM to lumbar and she was was tender in R sided lumbar paraspinals.  Pt responded well to Rx reporting improved pain from 6-7/10 before Rx to 0/10 at rest and 2/10 with standing.       OBJECTIVE IMPAIRMENTS: Abnormal gait, decreased activity tolerance, decreased balance, decreased endurance, decreased knowledge of use of DME, decreased mobility, difficulty walking, decreased ROM, decreased strength, obesity, and pain.   ACTIVITY LIMITATIONS: carrying, lifting, bending, sitting, standing, squatting, sleeping, stairs,  transfers, bed mobility, and locomotion level  PARTICIPATION LIMITATIONS: cleaning, laundry, shopping, community activity, and yard work  PERSONAL FACTORS: Fitness, Past/current experiences, and 1-2 comorbidities: HTN, ankle dysfunction  are also affecting patient's functional outcome.   REHAB POTENTIAL: Good  CLINICAL DECISION MAKING: Evolving/moderate complexity  EVALUATION COMPLEXITY: Moderate   GOALS: Goals reviewed with patient? Yes  SHORT TERM GOALS: Target date: 01/17/23  Pt will tolerate full aquatic sessions consistently without increase in pain and with improving function to demonstrate good toleration and effectiveness of intervention.   Baseline: Goal status: Met 01/17/23  2.  Pt will improve on 30S sit to stand test to >/= 7 (1/2 of norm value) to demonstrate improving mobility and strength Baseline: 2 Goal status: ongoing 01/21/23  3.  Pt will improve on lumbar ROM by 50% in all areas with decreasing pain Baseline: see chart Goal status: ongoing 02/14/23  4.  Pt will report increased toleration to standing up towards 10 minutes to fix a meal. Baseline: <4 min  Goal status: ongoing 02/14/23 (max 10 mins) Met 02/18/23  5.  Pt will report an overall decrease in max/worst pain to <6/10 to improve sleep pattern and decrease physiological responses to pain Baseline: 8/10 Goal status: ongoing 02/14/23 (8-9/10 due to burning since ablation.)  LONG TERM GOALS: Target date: 02/21/23  Pt to meet stated Foto Goal of 50 Baseline: 37% primary Goal status: Met 02/18/23  2.  Pt will be indep with final HEP's (land and aquatic as appropriate) for continued management of condition  Baseline:  Goal status: IN PROGRESS (received aquatic HEP)  3.  Pt will improve on Berg balance test to >/= 40/56 to demonstrate a decrease in fall risk.   Baseline: 26/56 Goal status: MET 02/14/23  4.  Pt will improve on Tug test to </=  12s to demonstrate improvement in lower extremity  function, mobility and decreased fall risk.  Baseline: 20.49 Goal status: MET 4/12  5.  Pt will improve on strength of bilat hips at least 1 full grade  Baseline: see chart Goal status: IN PROGRESS   6.  Pt will be able to walk through grocery store unlimited by fatigue or pain pushing cart to demonstrate improvement in functional mobility. Baseline: >5 mins Goal status: IN PROGRESS Needs to hold onto cart. Goes to smaller stores. (02/14/23)  PLAN:  PT FREQUENCY: 1-2x/week  PT DURATION: 4 weeks  PLANNED INTERVENTIONS: Therapeutic exercises, Therapeutic activity, Neuromuscular re-education, Balance training, Gait training, Patient/Family education, Self Care, Joint mobilization, Joint manipulation, Stair training, Orthotic/Fit training, DME instructions, Aquatic Therapy, Dry Needling, Electrical stimulation, Spinal manipulation, Spinal mobilization, Cryotherapy, Moist heat, scar mobilization, Splintting, Taping, Traction, Ultrasound, Ionotophoresis /ml Dexamethasone, Manual therapy, and Re-evaluation.  PLAN FOR NEXT SESSION: continued land based intervention as deemed appropriate by land based PT. Aquatics discontinued.  Audie Clear III PT, DPT 02/26/23 4:47 PM

## 2023-02-28 ENCOUNTER — Encounter (HOSPITAL_BASED_OUTPATIENT_CLINIC_OR_DEPARTMENT_OTHER): Payer: Self-pay

## 2023-02-28 ENCOUNTER — Ambulatory Visit (HOSPITAL_BASED_OUTPATIENT_CLINIC_OR_DEPARTMENT_OTHER): Payer: Medicare HMO

## 2023-02-28 DIAGNOSIS — R2689 Other abnormalities of gait and mobility: Secondary | ICD-10-CM

## 2023-02-28 DIAGNOSIS — M5459 Other low back pain: Secondary | ICD-10-CM

## 2023-02-28 DIAGNOSIS — R293 Abnormal posture: Secondary | ICD-10-CM | POA: Diagnosis not present

## 2023-02-28 DIAGNOSIS — M6281 Muscle weakness (generalized): Secondary | ICD-10-CM

## 2023-02-28 NOTE — Therapy (Signed)
OUTPATIENT PHYSICAL THERAPY THORACOLUMBAR  TREATMENT      Patient Name: Haley White MRN: [id number] DOB:[birth year], 67 y.o., female Today's Date: 02/28/2023  END OF SESSION:  PT End of Session - 02/28/23 1032     Visit Number 16    Number of Visits 21    Date for PT Re-Evaluation 03/18/23    Authorization Type aetna medicare    Progress Note Due on Visit 23    PT Start Time 1018    PT Stop Time 1100    PT Time Calculation (min) 42 min    Activity Tolerance Patient tolerated treatment well    Behavior During Therapy WFL for tasks assessed/performed                Past Medical History:  Diagnosis Date   Anxiety    Arthritis    Asthma    Cataract    Gout    Hyperlipidemia    Hypertension    Obese    Past Surgical History:  Procedure Laterality Date   APPENDECTOMY  1984   CATARACT EXTRACTION W/ INTRAOCULAR LENS  IMPLANT, BILATERAL  2014   CESAREAN SECTION  1984, 1987, 1994   X3   COLONOSCOPY  11/16/2013   w/Brodie    FOOT SURGERY Left 2002   KNEE SURGERY Left 1998   ROTATOR CUFF REPAIR Right 2007   TUBAL LIGATION  1994   Patient Active Problem List   Diagnosis Date Noted   DOE (dyspnea on exertion) 05/10/2022   Acute pulmonary embolism (HCC) 05/10/2022   Allergic rhinitis 03/15/2021   Gastro-esophageal reflux disease without esophagitis 03/15/2021   Mild persistent asthma, uncomplicated 03/15/2021   Aortic atherosclerosis (HCC) by Chest CT scan on 02/02/2018  01/10/2021   Asthma    Low back pain 03/17/2019   Lumbosacral spondylosis without myelopathy 03/17/2019   Cubital tunnel syndrome 10/14/2018   Closed fracture of distal end of right radius 07/09/2018   Pseudogout 12/09/2017   Major depressive disorder, recurrent, severe without psychotic features (HCC)    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   Suicide attempt by drug ingestion (HCC)    Medication management 08/22/2014   Vitamin D deficiency 08/22/2014   Hyperlipidemia, mixed 08/22/2014    Hypertension    Anxiety    Abnormal glucose    Class 2 severe obesity due to excess calories with serious comorbidity and body mass index (BMI) of 37.0 to 37.9 in adult [facility])     PCP: Lucky Cowboy, MD   REFERRING PROVIDER: Jene Every, MD   REFERRING DIAG: M54.51 (ICD-10-CM) - Vertebrogenic low back pain   Rationale for Evaluation and Treatment: Rehabilitation  THERAPY DIAG:  Other low back pain  Muscle weakness (generalized)  Other abnormalities of gait and mobility  ONSET DATE: >63yr  SUBJECTIVE:  SUBJECTIVE STATEMENT: Pt reports 2/10 pain level at entry. Has increased pain with increased activity.   PERTINENT HISTORY:  Patient is a very nice 67 yo div WF  with   HTN, HLD, Prediabetes  and Vitamin D Deficiency and had  hx/o recent Covid PNA~ May 2023  and  and in July 2023 was discovered to have a Rt sided PE and was hospitalized for Heparin & transitioned to Eliquis ( to continue 6 months  thru Jan 7 ) .    She was seen 5-6 weeks ago for evaluation for c/o  "shaking & forgetfulness " .  She reported poor short  & long term memory recall.  (Pt discontinued meds as per MD and reports back to baseline with memory)  PAIN:  Are you having pain? Yes: NPRS scale: 2/10 pain in lumbar   Pain location:  bilat sides of lumbar Pain description: ache, dull  Aggravating factors: standing <4 mins; walking >5 mins Relieving factors: Aspen lumbar brace; tylenol, ms relaxer  PRECAUTIONS: Back and Fall  WEIGHT BEARING RESTRICTIONS: No  FALLS:  Has patient fallen in last 6 months? No  LIVING ENVIRONMENT: Lives with: lives alone Lives in: House/apartment Stairs: Yes: External: 3 steps; none Has following equipment at home: Single point cane  OCCUPATION: retired  PLOF: Independent  PATIENT  GOALS: less pain, be able to stand and walk longer, get strength back  NEXT MD VISIT:  OBJECTIVE:    DIAGNOSTIC FINDINGS:  Pt reports recent MRI at [facility] but no results in Epic  4/26 MRI result   CLINICAL DATA: foot pain, soft tissue mass of left third toe US IMPRESSION: Focal tenosynovitis of an extensor tendon in the dorsal foot, with adjacent soft tissue swelling.    TODAY'S TREATMENT:        Therapeutic Exercise: LTR 2x10 Supine marching with PPT 2x10 Supine clams with RTB with TrA 2x10  Sidelying clams-2x10 ea Supine manual HSS 2x30 sec bilat Prone hip extension x5 ea Cat/cow in Q-ped- x5ea, 5" hold Child's pose stretch 20sec x3   Manual Therapy: Pt received STM to bilat lumbar paraspinals in prone to improve soft tissue tightness and mobility and reduce pain.   PATIENT EDUCATION:  Education details: exercise form, HEP and POC Person educated: Patient Education method: Medical illustrator Education comprehension: verbalized understanding  HOME EXERCISE PROGRAM:  Land HEP:  Access Code: [email or web address] URL: [email or web address] Date: 02/14/2023 Prepared by: Riki Altes  Exercises - Supine Lower Trunk Rotation  - 1-2 x daily - 7 x weekly - 2 sets - 10 reps - 3 seconds hold - Supine Pelvic Tilt  - 1-2 x daily - 7 x weekly - 2 sets - 10 reps - 5seconds hold - Supine Double Knee to Chest Modified  - 1-2 x daily - 7 x weekly - 2-3 sets - 20seconds hold    Access Code: [email or web address] URL: [email or web address] Date: 01/07/2023 Prepared by: [facility] - Outpatient Rehab - [address]  Exercises - Walking March  - 1 x daily - 7 x weekly - Side Stepping with Hand Floats  - 1 x daily - 7 x weekly - Seated Straddle on Flotation Forward Breast Stroke Arms and Bicycle Legs  - 1 x daily - 7 x weekly - 3 sets - 10 reps - Shoulder Extension with Resistance  - 1 x daily - 7 x weekly - 1-2 sets - 10 reps - Split Stance Shoulder Row  with Resistance  - 1 x daily - 7 x weekly -  1-2 sets - 10 reps - Standing Hip Abduction  - 1 x daily - 7 x weekly - 1-2 sets - 10 reps - Standing Hamstring Stretch on Chair  - 2 x daily - 7 x weekly - 2 reps - 20 seconds  hold - Standing 'L' Stretch at [employer]  - 1 x daily - 7 x weekly - 2 reps  Updated HEP: - Supine March  - 1-2 x daily - 7 x weekly - 2 sets - 10 reps - Supine Transversus Abdominis Bracing - Hands on Stomach  - 2 x daily - 7 x weekly - 2 sets - 10 reps - Supine Core Control with Heel Slide  - 1 x daily - 7 x weekly - 2 sets - 10 reps  ASSESSMENT:  CLINICAL IMPRESSION: Continued with manual techniques to lumbar p/s to decrease restrictions. Instructed pt in cat/cow and child's pose stretches which she reported benefit from. Suggested she stretch in the morning prior to getting out of bed. Also discussed mechanics with transfers and slowing down her movements.  Does have some back pain with switching from quadruped to prone. Will continue to work on core and hip strengthening.      OBJECTIVE IMPAIRMENTS: Abnormal gait, decreased activity tolerance, decreased balance, decreased endurance, decreased knowledge of use of DME, decreased mobility, difficulty walking, decreased ROM, decreased strength, obesity, and pain.   ACTIVITY LIMITATIONS: carrying, lifting, bending, sitting, standing, squatting, sleeping, stairs, transfers, bed mobility, and locomotion level  PARTICIPATION LIMITATIONS: cleaning, laundry, shopping, community activity, and yard work  PERSONAL FACTORS: Fitness, Past/current experiences, and 1-2 comorbidities: HTN, ankle dysfunction  are also affecting patient's functional outcome.   REHAB POTENTIAL: Good  CLINICAL DECISION MAKING: Evolving/moderate complexity  EVALUATION COMPLEXITY: Moderate   GOALS: Goals reviewed with patient? Yes  SHORT TERM GOALS: Target date: 01/17/23  Pt will tolerate full aquatic sessions consistently without increase in pain  and with improving function to demonstrate good toleration and effectiveness of intervention.   Baseline: Goal status: Met 01/17/23  2.  Pt will improve on 30S sit to stand test to >/= 7 (1/2 of norm value) to demonstrate improving mobility and strength Baseline: 2 Goal status: ongoing 01/21/23  3.  Pt will improve on lumbar ROM by 50% in all areas with decreasing pain Baseline: see chart Goal status: ongoing 02/14/23  4.  Pt will report increased toleration to standing up towards 10 minutes to fix a meal. Baseline: <4 min  Goal status: ongoing 02/14/23 (max 10 mins) Met 02/18/23  5.  Pt will report an overall decrease in max/worst pain to <6/10 to improve sleep pattern and decrease physiological responses to pain Baseline: 8/10 Goal status: ongoing 02/14/23 (8-9/10 due to burning since ablation.)     LONG TERM GOALS: Target date: 02/21/23  Pt to meet stated Foto Goal of 50 Baseline: 37% primary Goal status: Met 02/18/23  2.  Pt will be indep with final HEP's (land and aquatic as appropriate) for continued management of condition  Baseline:  Goal status: IN PROGRESS (received aquatic HEP)  3.  Pt will improve on Berg balance test to >/= 40/56 to demonstrate a decrease in fall risk.   Baseline: 26/56 Goal status: MET 02/14/23  4.  Pt will improve on Tug test to </=  12s to demonstrate improvement in lower extremity function, mobility and decreased fall risk.  Baseline: 20.49 Goal status: MET 4/12  5.  Pt will improve on strength of bilat hips at least 1 full grade  Baseline: see chart Goal status: IN PROGRESS   6.  Pt will be able to walk through grocery store unlimited by fatigue or pain pushing cart to demonstrate improvement in functional mobility. Baseline: >5 mins Goal status: IN PROGRESS Needs to hold onto cart. Goes to smaller stores. (02/14/23)  PLAN:  PT FREQUENCY: 1-2x/week  PT DURATION: 4 weeks  PLANNED INTERVENTIONS: Therapeutic exercises, Therapeutic activity,  Neuromuscular re-education, Balance training, Gait training, Patient/Family education, Self Care, Joint mobilization, Joint manipulation, Stair training, Orthotic/Fit training, DME instructions, Aquatic Therapy, Dry Needling, Electrical stimulation, Spinal manipulation, Spinal mobilization, Cryotherapy, Moist heat, scar mobilization, Splintting, Taping, Traction, Ultrasound, Ionotophoresis 4mg /ml Dexamethasone, Manual therapy, and Re-evaluation.  PLAN FOR NEXT SESSION: continued land based intervention as deemed appropriate by land based PT. Aquatics discontinued.  Riki Altes, PTA  02/28/23 12:47 PM

## 2023-03-05 ENCOUNTER — Encounter (HOSPITAL_BASED_OUTPATIENT_CLINIC_OR_DEPARTMENT_OTHER): Payer: Self-pay

## 2023-03-05 ENCOUNTER — Ambulatory Visit (HOSPITAL_BASED_OUTPATIENT_CLINIC_OR_DEPARTMENT_OTHER): Payer: Medicare HMO | Attending: Specialist

## 2023-03-05 ENCOUNTER — Encounter: Payer: Self-pay | Admitting: Gastroenterology

## 2023-03-05 DIAGNOSIS — R2689 Other abnormalities of gait and mobility: Secondary | ICD-10-CM | POA: Diagnosis not present

## 2023-03-05 DIAGNOSIS — M6281 Muscle weakness (generalized): Secondary | ICD-10-CM | POA: Diagnosis not present

## 2023-03-05 DIAGNOSIS — M5459 Other low back pain: Secondary | ICD-10-CM | POA: Insufficient documentation

## 2023-03-05 NOTE — Therapy (Signed)
OUTPATIENT PHYSICAL THERAPY THORACOLUMBAR  TREATMENT      Patient Name: Haley White MRN: [id number] DOB:1955/12/24, 67 y.o., female Today's Date: 03/05/2023  END OF SESSION:  PT End of Session - 03/05/23 0805     Visit Number 17    Number of Visits 21    Date for PT Re-Evaluation 03/18/23    Authorization Type aetna medicare    Progress Note Due on Visit 23    PT Start Time 0804    PT Stop Time 0843    PT Time Calculation (min) 39 min    Activity Tolerance Patient tolerated treatment well    Behavior During Therapy WFL for tasks assessed/performed                 Past Medical History:  Diagnosis Date   Anxiety    Arthritis    Asthma    Cataract    Gout    Hyperlipidemia    Hypertension    Obese    Past Surgical History:  Procedure Laterality Date   APPENDECTOMY  1984   CATARACT EXTRACTION W/ INTRAOCULAR LENS  IMPLANT, BILATERAL  2014   CESAREAN SECTION  1984, 1987, 1994   X3   COLONOSCOPY  11/16/2013   w/Brodie    FOOT SURGERY Left 2002   KNEE SURGERY Left 1998   ROTATOR CUFF REPAIR Right 2007   TUBAL LIGATION  1994   Patient Active Problem List   Diagnosis Date Noted   DOE (dyspnea on exertion) 05/10/2022   Acute pulmonary embolism (HCC) 05/10/2022   Allergic rhinitis 03/15/2021   Gastro-esophageal reflux disease without esophagitis 03/15/2021   Mild persistent asthma, uncomplicated 03/15/2021   Aortic atherosclerosis (HCC) by Chest CT scan on 02/02/2018  01/10/2021   Asthma    Low back pain 03/17/2019   Lumbosacral spondylosis without myelopathy 03/17/2019   Cubital tunnel syndrome 10/14/2018   Closed fracture of distal end of right radius 07/09/2018   Pseudogout 12/09/2017   Major depressive disorder, recurrent, severe without psychotic features (HCC)    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   Suicide attempt by drug ingestion (HCC)    Medication management 08/22/2014   Vitamin D deficiency 08/22/2014   Hyperlipidemia, mixed 08/22/2014    Hypertension    Anxiety    Abnormal glucose    Class 2 severe obesity due to excess calories with serious comorbidity and body mass index (BMI) of 37.0 to 37.9 in adult [facility])     PCP: Lucky Cowboy, MD   REFERRING PROVIDER: Jene Every, MD   REFERRING DIAG: M54.51 (ICD-10-CM) - Vertebrogenic low back pain   Rationale for Evaluation and Treatment: Rehabilitation  THERAPY DIAG:  Other low back pain  Muscle weakness (generalized)  Other abnormalities of gait and mobility  ONSET DATE: >67yr  SUBJECTIVE:  SUBJECTIVE STATEMENT: Pt reports pain with walking. "I feel like the pain is coming more from my muscles in my back." Pt reports she tried pulling weeds in her little garden, which bothered her. "I was only able to do a few minutes."  PERTINENT HISTORY:  Patient is a very nice 67 yo div WF  with   HTN, HLD, Prediabetes  and Vitamin D Deficiency and had  hx/o recent Covid PNA~ May 2023  and  and in July 2023 was discovered to have a Rt sided PE and was hospitalized for Heparin & transitioned to Eliquis ( to continue 6 months  thru Jan 7 ) .    She was seen 5-6 weeks ago for evaluation for c/o  "shaking & forgetfulness " .  She reported poor short  & long term memory recall.  (Pt discontinued meds as per MD and reports back to baseline with memory)  PAIN:  Are you having pain? Yes: NPRS scale: 2/10 pain in lumbar   Pain location:  bilat sides of lumbar Pain description: ache, dull  Aggravating factors: standing <4 mins; walking >5 mins Relieving factors: Aspen lumbar brace; tylenol, ms relaxer  PRECAUTIONS: Back and Fall  WEIGHT BEARING RESTRICTIONS: No  FALLS:  Has patient fallen in last 6 months? No  LIVING ENVIRONMENT: Lives with: lives alone Lives in: House/apartment Stairs: Yes:  External: 3 steps; none Has following equipment at home: Single point cane  OCCUPATION: retired  PLOF: Independent  PATIENT GOALS: less pain, be able to stand and walk longer, get strength back  NEXT MD VISIT:  OBJECTIVE:    DIAGNOSTIC FINDINGS:  Pt reports recent MRI at emerg ortho but no results in Epic  4/26 MRI result   CLINICAL DATA: foot pain, soft tissue mass of left third toe US IMPRESSION: Focal tenosynovitis of an extensor tendon in the dorsal foot, with adjacent soft tissue swelling.    TODAY'S TREATMENT:        Therapeutic Exercise: LTR 2x10 PPT 5" x20 Supine marching with PPT 2x10 Sidelying hip abd 2x10  Cat/cow in Q-ped- x10ea, 5" hold Child's pose stretch 20sec x3  Review of HEP and condition/expectations. Discussed her purchasing TENS unit.   Manual Therapy: Pt received STM to bilat lumbar paraspinals in prone to improve soft tissue tightness and mobility and reduce pain.   PATIENT EDUCATION:  Education details: exercise form, HEP and POC Person educated: Patient Education method: Medical illustrator Education comprehension: verbalized understanding  HOME EXERCISE PROGRAM:  Land HEP: (prefers printout) Access Code: [email or web address] URL: [email or web address] Date: 02/14/2023 Prepared by: Riki Altes  Exercises - Supine Lower Trunk Rotation  - 1-2 x daily - 7 x weekly - 2 sets - 10 reps - 3 seconds hold - Supine Pelvic Tilt  - 1-2 x daily - 7 x weekly - 2 sets - 10 reps - 5seconds hold - Supine Double Knee to Chest Modified  - 1-2 x daily - 7 x weekly - 2-3 sets - 20seconds hold    Access Code: [email or web address] (prefers printout) URL: [email or web address] Date: 01/07/2023 Prepared by: [facility] - Outpatient Rehab - [address]  Exercises - Walking March  - 1 x daily - 7 x weekly - Side Stepping with Hand Floats  - 1 x daily - 7 x weekly - Seated Straddle on Flotation Forward Breast Stroke Arms and Bicycle  Legs  - 1 x daily - 7 x weekly - 3 sets - 10 reps - Shoulder Extension with Resistance  - 1 x  daily - 7 x weekly - 1-2 sets - 10 reps - Split Stance Shoulder Row with Resistance  - 1 x daily - 7 x weekly - 1-2 sets - 10 reps - Standing Hip Abduction  - 1 x daily - 7 x weekly - 1-2 sets - 10 reps - Standing Hamstring Stretch on Chair  - 2 x daily - 7 x weekly - 2 reps - 20 seconds  hold - Standing 'L' Stretch at [employer]  - 1 x daily - 7 x weekly - 2 reps  Updated HEP: - Supine March  - 1-2 x daily - 7 x weekly - 2 sets - 10 reps - Supine Transversus Abdominis Bracing - Hands on Stomach  - 2 x daily - 7 x weekly - 2 sets - 10 reps - Supine Core Control with Heel Slide  - 1 x daily - 7 x weekly - 2 sets - 10 reps  ASSESSMENT:  CLINICAL IMPRESSION: Continued with manual techniques to lumbar p/s to decrease restrictions.Tighter on L side of low back. She continues to report benefit from stretching of lumbar extensors. She denied pain with exercises. Discussed use of TENS unit which may benefit her.    OBJECTIVE IMPAIRMENTS: Abnormal gait, decreased activity tolerance, decreased balance, decreased endurance, decreased knowledge of use of DME, decreased mobility, difficulty walking, decreased ROM, decreased strength, obesity, and pain.   ACTIVITY LIMITATIONS: carrying, lifting, bending, sitting, standing, squatting, sleeping, stairs, transfers, bed mobility, and locomotion level  PARTICIPATION LIMITATIONS: cleaning, laundry, shopping, community activity, and yard work  PERSONAL FACTORS: Fitness, Past/current experiences, and 1-2 comorbidities: HTN, ankle dysfunction  are also affecting patient's functional outcome.   REHAB POTENTIAL: Good  CLINICAL DECISION MAKING: Evolving/moderate complexity  EVALUATION COMPLEXITY: Moderate   GOALS: Goals reviewed with patient? Yes  SHORT TERM GOALS: Target date: 01/17/23  Pt will tolerate full aquatic sessions consistently without increase in pain  and with improving function to demonstrate good toleration and effectiveness of intervention.   Baseline: Goal status: Met 01/17/23  2.  Pt will improve on 30S sit to stand test to >/= 7 (1/2 of norm value) to demonstrate improving mobility and strength Baseline: 2 Goal status: ongoing 01/21/23  3.  Pt will improve on lumbar ROM by 50% in all areas with decreasing pain Baseline: see chart Goal status: ongoing 02/14/23  4.  Pt will report increased toleration to standing up towards 10 minutes to fix a meal. Baseline: <4 min  Goal status: ongoing 02/14/23 (max 10 mins) Met 02/18/23  5.  Pt will report an overall decrease in max/worst pain to <6/10 to improve sleep pattern and decrease physiological responses to pain Baseline: 8/10 Goal status: ongoing 02/14/23 (8-9/10 due to burning since ablation.)     LONG TERM GOALS: Target date: 02/21/23  Pt to meet stated Foto Goal of 50 Baseline: 37% primary Goal status: Met 02/18/23  2.  Pt will be indep with final HEP's (land and aquatic as appropriate) for continued management of condition  Baseline:  Goal status: IN PROGRESS (received aquatic HEP)  3.  Pt will improve on Berg balance test to >/= 40/56 to demonstrate a decrease in fall risk.   Baseline: 26/56 Goal status: MET 02/14/23  4.  Pt will improve on Tug test to </=  12s to demonstrate improvement in lower extremity function, mobility and decreased fall risk.  Baseline: 20.49 Goal status: MET 4/12  5.  Pt will improve on strength of bilat hips at least 1 full grade  Baseline: see chart Goal status: IN PROGRESS   6.  Pt will be able to walk through grocery store unlimited by fatigue or pain pushing cart to demonstrate improvement in functional mobility. Baseline: >5 mins Goal status: IN PROGRESS Needs to hold onto cart. Goes to smaller stores. (02/14/23)  PLAN:  PT FREQUENCY: 1-2x/week  PT DURATION: 4 weeks  PLANNED INTERVENTIONS: Therapeutic exercises, Therapeutic activity,  Neuromuscular re-education, Balance training, Gait training, Patient/Family education, Self Care, Joint mobilization, Joint manipulation, Stair training, Orthotic/Fit training, DME instructions, Aquatic Therapy, Dry Needling, Electrical stimulation, Spinal manipulation, Spinal mobilization, Cryotherapy, Moist heat, scar mobilization, Splintting, Taping, Traction, Ultrasound, Ionotophoresis 4mg /ml Dexamethasone, Manual therapy, and Re-evaluation.  PLAN FOR NEXT SESSION: continued land based intervention as deemed appropriate by land based PT. Aquatics discontinued.  Riki Altes, PTA  03/05/23 9:26 AM

## 2023-03-09 DIAGNOSIS — Z62820 Parent-biological child conflict: Secondary | ICD-10-CM | POA: Diagnosis not present

## 2023-03-09 DIAGNOSIS — F331 Major depressive disorder, recurrent, moderate: Secondary | ICD-10-CM | POA: Diagnosis not present

## 2023-03-09 DIAGNOSIS — F411 Generalized anxiety disorder: Secondary | ICD-10-CM | POA: Diagnosis not present

## 2023-03-10 ENCOUNTER — Encounter (HOSPITAL_BASED_OUTPATIENT_CLINIC_OR_DEPARTMENT_OTHER): Payer: Self-pay

## 2023-03-10 ENCOUNTER — Ambulatory Visit (HOSPITAL_BASED_OUTPATIENT_CLINIC_OR_DEPARTMENT_OTHER): Payer: Medicare HMO

## 2023-03-10 DIAGNOSIS — M5459 Other low back pain: Secondary | ICD-10-CM | POA: Diagnosis not present

## 2023-03-10 DIAGNOSIS — M6281 Muscle weakness (generalized): Secondary | ICD-10-CM

## 2023-03-10 DIAGNOSIS — R2689 Other abnormalities of gait and mobility: Secondary | ICD-10-CM

## 2023-03-10 NOTE — Therapy (Signed)
OUTPATIENT PHYSICAL THERAPY THORACOLUMBAR  TREATMENT      Patient Name: Haley White MRN: [id number] DOB:06-13-1956, 67 y.o., female Today's Date: 03/10/2023  END OF SESSION:  PT End of Session - 03/10/23 1344     Visit Number 18    Number of Visits 21    Date for PT Re-Evaluation 03/18/23    Authorization Type aetna medicare    Progress Note Due on Visit 23    PT Start Time 1345    PT Stop Time 1433    PT Time Calculation (min) 48 min    Activity Tolerance Patient tolerated treatment well    Behavior During Therapy WFL for tasks assessed/performed                 Past Medical History:  Diagnosis Date   Anxiety    Arthritis    Asthma    Cataract    Gout    Hyperlipidemia    Hypertension    Obese    Past Surgical History:  Procedure Laterality Date   APPENDECTOMY  1984   CATARACT EXTRACTION W/ INTRAOCULAR LENS  IMPLANT, BILATERAL  2014   CESAREAN SECTION  1984, 1987, 1994   X3   COLONOSCOPY  11/16/2013   w/Brodie    FOOT SURGERY Left 2002   KNEE SURGERY Left 1998   ROTATOR CUFF REPAIR Right 2007   TUBAL LIGATION  1994   Patient Active Problem List   Diagnosis Date Noted   DOE (dyspnea on exertion) 05/10/2022   Acute pulmonary embolism (HCC) 05/10/2022   Allergic rhinitis 03/15/2021   Gastro-esophageal reflux disease without esophagitis 03/15/2021   Mild persistent asthma, uncomplicated 03/15/2021   Aortic atherosclerosis (HCC) by Chest CT scan on 02/02/2018  01/10/2021   Asthma    Low back pain 03/17/2019   Lumbosacral spondylosis without myelopathy 03/17/2019   Cubital tunnel syndrome 10/14/2018   Closed fracture of distal end of right radius 07/09/2018   Pseudogout 12/09/2017   Major depressive disorder, recurrent, severe without psychotic features (HCC)    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   Suicide attempt by drug ingestion (HCC)    Medication management 08/22/2014   Vitamin D deficiency 08/22/2014   Hyperlipidemia, mixed 08/22/2014    Hypertension    Anxiety    Abnormal glucose    Class 2 severe obesity due to excess calories with serious comorbidity and body mass index (BMI) of 37.0 to 37.9 in adult [facility])     PCP: Lucky Cowboy, MD   REFERRING PROVIDER: Jene Every, MD   REFERRING DIAG: M54.51 (ICD-10-CM) - Vertebrogenic low back pain   Rationale for Evaluation and Treatment: Rehabilitation  THERAPY DIAG:  Other low back pain  Other abnormalities of gait and mobility  Muscle weakness (generalized)  ONSET DATE: >89yr  SUBJECTIVE:  SUBJECTIVE STATEMENT: Pt reports she has not done much today. Arrives with TENS unit she purchased and would like instruction in this for home use. 1-2/10 pain level. Has been having pain in L hand between digits 1 and 2. Plans to see MD about this.   PERTINENT HISTORY:  Patient is a very nice 67 yo div WF  with   HTN, HLD, Prediabetes  and Vitamin D Deficiency and had  hx/o recent Covid PNA~ May 2023  and  and in July 2023 was discovered to have a Rt sided PE and was hospitalized for Heparin & transitioned to Eliquis ( to continue 6 months  thru Jan 7 ) .    She was seen 5-6 weeks ago for evaluation for c/o  "shaking & forgetfulness " .  She reported poor short  & long term memory recall.  (Pt discontinued meds as per MD and reports back to baseline with memory)  PAIN:  Are you having pain? Yes: NPRS scale: 2/10 pain in lumbar   Pain location:  bilat sides of lumbar Pain description: ache, dull  Aggravating factors: standing <4 mins; walking >5 mins Relieving factors: Aspen lumbar brace; tylenol, ms relaxer  PRECAUTIONS: Back and Fall  WEIGHT BEARING RESTRICTIONS: No  FALLS:  Has patient fallen in last 6 months? No  LIVING ENVIRONMENT: Lives with: lives alone Lives in:  House/apartment Stairs: Yes: External: 3 steps; none Has following equipment at home: Single point cane  OCCUPATION: retired  PLOF: Independent  PATIENT GOALS: less pain, be able to stand and walk longer, get strength back  NEXT MD VISIT:  OBJECTIVE:    DIAGNOSTIC FINDINGS:  Pt reports [facility] at [facility] but no results in Epic  4/26 MRI result   CLINICAL DATA: foot pain, soft tissue mass of left third toe US IMPRESSION: Focal tenosynovitis of an extensor tendon in the dorsal foot, with adjacent soft tissue swelling.    TODAY'S TREATMENT:         Review/instruction of TENS unit  Therapeutic Exercise: LTR x15 PPT 5" x20 Supine marching with PPT 2x10 Sidelying hip abd 2x10  Cat/cow in Q-ped- x10ea, 5" hold Child's pose stretch 20sec x3  Review of HEP and condition/expectations. Discussed her purchasing TENS unit.   Manual Therapy: Pt received STM and cupping to bilat lumbar paraspinals in prone to improve soft tissue tightness and mobility and reduce pain.   PATIENT EDUCATION:  Education details: exercise form, HEP and POC Person educated: Patient Education method: Medical illustrator Education comprehension: verbalized understanding  HOME EXERCISE PROGRAM:  Land HEP: (prefers printout) Access Code: [email or web address] URL: [email or web address] Date: 02/14/2023 Prepared by: Riki Altes  Exercises - Supine Lower Trunk Rotation  - 1-2 x daily - 7 x weekly - 2 sets - 10 reps - 3 seconds hold - Supine Pelvic Tilt  - 1-2 x daily - 7 x weekly - 2 sets - 10 reps - 5seconds hold - Supine Double Knee to Chest Modified  - 1-2 x daily - 7 x weekly - 2-3 sets - 20seconds hold    Access Code: [email or web address] (prefers printout) URL: [email or web address] Date: 01/07/2023 Prepared by: [facility] [address]  Exercises - Walking March  - 1 x daily - 7 x weekly - Side Stepping with Hand Floats  - 1 x daily - 7 x  weekly - Seated Straddle on Flotation Forward Breast Stroke Arms and Bicycle Legs  - 1 x daily - 7 x weekly - 3 sets -  10 reps - Shoulder Extension with Resistance  - 1 x daily - 7 x weekly - 1-2 sets - 10 reps - Split Stance Shoulder Row with Resistance  - 1 x daily - 7 x weekly - 1-2 sets - 10 reps - Standing Hip Abduction  - 1 x daily - 7 x weekly - 1-2 sets - 10 reps - Standing Hamstring Stretch on Chair  - 2 x daily - 7 x weekly - 2 reps - 20 seconds  hold - Standing 'L' Stretch at [employer]  - 1 x daily - 7 x weekly - 2 reps  Updated HEP: - Supine March  - 1-2 x daily - 7 x weekly - 2 sets - 10 reps - Supine Transversus Abdominis Bracing - Hands on Stomach  - 2 x daily - 7 x weekly - 2 sets - 10 reps - Supine Core Control with Heel Slide  - 1 x daily - 7 x weekly - 2 sets - 10 reps  ASSESSMENT:  CLINICAL IMPRESSION: Reviewed TENS unit for pt to use at home for pain relief for muscle tension/spasm in lumbar paraspinals. Trialed cupping today using static technique which pt reported benefit from. Followed this by lumbar stretching. Careful of L hand pain. She had good tolerance for gentle core/hip strengthening. Will monitor symptoms next visit and progress as tolerated.    OBJECTIVE IMPAIRMENTS: Abnormal gait, decreased activity tolerance, decreased balance, decreased endurance, decreased knowledge of use of DME, decreased mobility, difficulty walking, decreased ROM, decreased strength, obesity, and pain.   ACTIVITY LIMITATIONS: carrying, lifting, bending, sitting, standing, squatting, sleeping, stairs, transfers, bed mobility, and locomotion level  PARTICIPATION LIMITATIONS: cleaning, laundry, shopping, community activity, and yard work  PERSONAL FACTORS: Fitness, Past/current experiences, and 1-2 comorbidities: HTN, ankle dysfunction  are also affecting patient's functional outcome.   REHAB POTENTIAL: Good  CLINICAL DECISION MAKING: Evolving/moderate complexity  EVALUATION  COMPLEXITY: Moderate   GOALS: Goals reviewed with patient? Yes  SHORT TERM GOALS: Target date: 01/17/23  Pt will tolerate full aquatic sessions consistently without increase in pain and with improving function to demonstrate good toleration and effectiveness of intervention.   Baseline: Goal status: Met 01/17/23  2.  Pt will improve on 30S sit to stand test to >/= 7 (1/2 of norm value) to demonstrate improving mobility and strength Baseline: 2 Goal status: ongoing 01/21/23  3.  Pt will improve on lumbar ROM by 50% in all areas with decreasing pain Baseline: see chart Goal status: ongoing 02/14/23  4.  Pt will report increased toleration to standing up towards 10 minutes to fix a meal. Baseline: <4 min  Goal status: ongoing 02/14/23 (max 10 mins) Met 02/18/23  5.  Pt will report an overall decrease in max/worst pain to <6/10 to improve sleep pattern and decrease physiological responses to pain Baseline: 8/10 Goal status: ongoing 02/14/23 (8-9/10 due to burning since ablation.)     LONG TERM GOALS: Target date: 02/21/23  Pt to meet stated Foto Goal of 50 Baseline: 37% primary Goal status: Met 02/18/23  2.  Pt will be indep with final HEP's (land and aquatic as appropriate) for continued management of condition  Baseline:  Goal status: IN PROGRESS (received aquatic HEP)  3.  Pt will improve on Berg balance test to >/= 40/56 to demonstrate a decrease in fall risk.   Baseline: 26/56 Goal status: MET 02/14/23  4.  Pt will improve on Tug test to </=  12s to demonstrate improvement in lower extremity function, mobility  and decreased fall risk.  Baseline: 20.49 Goal status: MET 4/12  5.  Pt will improve on strength of bilat hips at least 1 full grade  Baseline: see chart Goal status: IN PROGRESS   6.  Pt will be able to walk through grocery store unlimited by fatigue or pain pushing cart to demonstrate improvement in functional mobility. Baseline: >5 mins Goal status: IN PROGRESS  Needs to hold onto cart. Goes to smaller stores. (02/14/23)  PLAN:  PT FREQUENCY: 1-2x/week  PT DURATION: 4 weeks  PLANNED INTERVENTIONS: Therapeutic exercises, Therapeutic activity, Neuromuscular re-education, Balance training, Gait training, Patient/Family education, Self Care, Joint mobilization, Joint manipulation, Stair training, Orthotic/Fit training, DME instructions, Aquatic Therapy, Dry Needling, Electrical stimulation, Spinal manipulation, Spinal mobilization, Cryotherapy, Moist heat, scar mobilization, Splintting, Taping, Traction, Ultrasound, Ionotophoresis 4mg /ml Dexamethasone, Manual therapy, and Re-evaluation.  PLAN FOR NEXT SESSION: continued land based intervention as deemed appropriate by land based PT. Aquatics discontinued.  Riki Altes, PTA  03/10/23 4:52 PM

## 2023-03-11 DIAGNOSIS — J3081 Allergic rhinitis due to animal (cat) (dog) hair and dander: Secondary | ICD-10-CM | POA: Diagnosis not present

## 2023-03-11 DIAGNOSIS — J3089 Other allergic rhinitis: Secondary | ICD-10-CM | POA: Diagnosis not present

## 2023-03-11 DIAGNOSIS — J301 Allergic rhinitis due to pollen: Secondary | ICD-10-CM | POA: Diagnosis not present

## 2023-03-13 ENCOUNTER — Ambulatory Visit: Payer: Medicare HMO | Admitting: Internal Medicine

## 2023-03-13 ENCOUNTER — Encounter: Payer: Self-pay | Admitting: Internal Medicine

## 2023-03-13 VITALS — BP 130/74 | HR 97 | Temp 97.7°F | Ht 61.0 in | Wt 219.0 lb

## 2023-03-13 DIAGNOSIS — J301 Allergic rhinitis due to pollen: Secondary | ICD-10-CM

## 2023-03-13 DIAGNOSIS — J4520 Mild intermittent asthma, uncomplicated: Secondary | ICD-10-CM

## 2023-03-13 DIAGNOSIS — K219 Gastro-esophageal reflux disease without esophagitis: Secondary | ICD-10-CM | POA: Diagnosis not present

## 2023-03-13 NOTE — Progress Notes (Signed)
Haley White    [id number]    11/05/1955  Primary Care Physician:McKeown, Chrissie Noa, MD Date of Appointment: 03/13/2023 Established Patient Visit  Chief complaint:   Chief Complaint  Patient presents with   Follow-up    No c/o      HPI: Haley White is a 67 y.o. woman with moderate persistent asthma and covid 19 infection in June 2023.   Interval Updates: Here for follow up for asthma. Doing much better. She has completed 6 months of therapy with eliquis for provoked PE. Stopped breztri and started taking symbicort about twice a week. Hasn't needed albuterol since she was sick with covid.   She is having overnight desaturation. Due to see sleep medicine for osa evaluation.   No limitations with her breathing. Allergies controlled on current meds. Reflux is not bothering her.  I have reviewed the patient's family social and past medical history and updated as appropriate.   Past Medical History:  Diagnosis Date   Anxiety    Arthritis    Asthma    Cataract    Gout    Hyperlipidemia    Hypertension    Obese     Past Surgical History:  Procedure Laterality Date   APPENDECTOMY  1984   CATARACT EXTRACTION W/ INTRAOCULAR LENS  IMPLANT, BILATERAL  2014   CESAREAN SECTION  1984, 1987, 1994   X3   COLONOSCOPY  11/16/2013   w/Brodie    FOOT SURGERY Left 2002   KNEE SURGERY Left 1998   ROTATOR CUFF REPAIR Right 2007   TUBAL LIGATION  1994    Family History  Problem Relation Age of Onset   Dementia Father    Anxiety disorder Sister    Colon cancer Neg Hx    Colon polyps Neg Hx    Esophageal cancer Neg Hx    Stomach cancer Neg Hx    Rectal cancer Neg Hx     Social History   Occupational History   Not on file  Tobacco Use   Smoking status: Former    Packs/day: 1.00    Years: 7.00    Additional pack years: 0.00    Total pack years: 7.00    Types: Cigarettes    Quit date: 11/04/1977    Years since quitting: 45.3   Smokeless tobacco: Never  Vaping  Use   Vaping Use: Never used  Substance and Sexual Activity   Alcohol use: Yes    Alcohol/week: 4.0 standard drinks of alcohol    Types: 4 Glasses of wine per week   Drug use: No    Comment: 06/03/2022-gummies   Sexual activity: Yes    Birth control/protection: Surgical     Physical Exam: Blood pressure 130/74, pulse 97, temperature 97.7 F (36.5 C), temperature source Oral, height 5\' 1"  (1.549 m), weight 219 lb (99.3 kg), last menstrual period 11/26/2011, SpO2 96 %.  Gen:      No acute distress ENT:  no thrush, no nasal polyps, mucus membranes moist Lungs:    No increased respiratory effort, symmetric chest wall excursion, clear to auscultation bilaterally, no wheezes or crackles CV:         Regular rate and rhythm; no murmurs, rubs, or gallops.  No pedal edema   Data Reviewed: Imaging: I have personally reviewed the CT Chest which shows resolved GGO from previous covid infection. Acute right sided PE without right heart strain.   PFTs:     Latest Ref  Rng & Units 06/14/2022   11:52 AM  PFT Results  FVC-Pre L 3.00   FVC-Predicted Pre % 102   FVC-Post L 2.98   FVC-Predicted Post % 101   Pre FEV1/FVC % % 84   Post FEV1/FCV % % 87   FEV1-Pre L 2.53   FEV1-Predicted Pre % 113   FEV1-Post L 2.58   DLCO uncorrected ml/min/mmHg 17.01   DLCO UNC% % 91   DLCO corrected ml/min/mmHg 18.78   DLCO COR %Predicted % 101   DLVA Predicted % 102   TLC L 5.07   TLC % Predicted % 106   RV % Predicted % 101    I have personally reviewed the patient's PFTs and they show normal pulmonary function.   Labs: Lab Results  Component Value Date   WBC 6.9 01/22/2023   HGB 13.7 01/22/2023   HCT 41.2 01/22/2023   MCV 94.5 01/22/2023   PLT 297 01/22/2023   Lab Results  Component Value Date   NA 143 01/22/2023   K 4.6 01/22/2023   CL 106 01/22/2023   CO2 25 01/22/2023     Immunization status: Immunization History  Administered Date(s) Administered   Influenza, High Dose  Seasonal PF 10/17/2017   Influenza,inj,Quad PF,6+ Mos 09/17/2017   Influenza-Unspecified 08/20/2018   PFIZER(Purple Top)SARS-COV-2 Vaccination 01/20/2020, 02/10/2020, 03/27/2020   PPD Test 10/08/2017, 11/29/2019, 01/10/2021   Pneumococcal Conjugate-13 11/05/1995   Pneumococcal Polysaccharide-23 10/08/2017, 06/15/2021   Td 11/29/2019   Tdap 11/04/2008, 03/28/2020    External Records Personally Reviewed: hospital stay  Assessment:  Mild intermittent asthma, well controlled Gerd, controlled Seasonal allergies, controlled  Plan/Recommendations: Continue the symbicort inhaler as needed.  If you are taking albuterol rescue inhaler more than twice a week, start taking symbicort regularly 2 puffs twice a day.  If you are sick or could get sick start taking symbicort regularly 2 puffs twice a day an extend for three more days after you are feeling better.   Continue reflux and allergy medications.    Return to Care: Return in about 1 year (around 03/12/2024).   Durel Salts, MD Pulmonary and Critical Care Medicine [facility] Office:[phone]

## 2023-03-13 NOTE — Patient Instructions (Addendum)
Please schedule follow up scheduled with myself in 1 year.  If my schedule is not open yet, we will contact you with a reminder closer to that time. Please call [phone] if you haven't heard from [place] a month before.   Continue the symbicort inhaler as needed.  If you are taking albuterol rescue inhaler  more than twice a week, start taking symbicort regularly 2 puffs twice a day.  If you are sick or could get sick start taking symbicort regularly 2 puffs twice a day an extend for three more days after you are feeling better.   Come see me sooner if you need me.   Continue reflux and allergy medications.

## 2023-03-14 ENCOUNTER — Encounter (HOSPITAL_BASED_OUTPATIENT_CLINIC_OR_DEPARTMENT_OTHER): Payer: Self-pay | Admitting: Physical Therapy

## 2023-03-14 ENCOUNTER — Ambulatory Visit (HOSPITAL_BASED_OUTPATIENT_CLINIC_OR_DEPARTMENT_OTHER): Payer: Medicare HMO | Admitting: Physical Therapy

## 2023-03-14 DIAGNOSIS — M5459 Other low back pain: Secondary | ICD-10-CM | POA: Diagnosis not present

## 2023-03-14 DIAGNOSIS — M6281 Muscle weakness (generalized): Secondary | ICD-10-CM | POA: Diagnosis not present

## 2023-03-14 DIAGNOSIS — R2689 Other abnormalities of gait and mobility: Secondary | ICD-10-CM | POA: Diagnosis not present

## 2023-03-14 NOTE — Therapy (Signed)
OUTPATIENT PHYSICAL THERAPY THORACOLUMBAR  TREATMENT      Patient Name: Haley White MRN: [id number] DOB:1956-06-08, 67 y.o., female Today's Date: 03/14/2023  END OF SESSION:  PT End of Session - 03/14/23 0942     Visit Number 19    Number of Visits 21    Date for PT Re-Evaluation 03/18/23    Authorization Type aetna medicare    PT Start Time [phone]    PT Stop Time 1021    PT Time Calculation (min) 40 min    Activity Tolerance Patient tolerated treatment well    Behavior During Therapy WFL for tasks assessed/performed                 Past Medical History:  Diagnosis Date   Anxiety    Arthritis    Asthma    Cataract    Gout    Hyperlipidemia    Hypertension    Obese    Past Surgical History:  Procedure Laterality Date   APPENDECTOMY  1984   CATARACT EXTRACTION W/ INTRAOCULAR LENS  IMPLANT, BILATERAL  2014   CESAREAN SECTION  1984, 1987, 1994   X3   COLONOSCOPY  11/16/2013   w/Brodie    FOOT SURGERY Left 2002   KNEE SURGERY Left 1998   ROTATOR CUFF REPAIR Right 2007   TUBAL LIGATION  1994   Patient Active Problem List   Diagnosis Date Noted   DOE (dyspnea on exertion) 05/10/2022   Acute pulmonary embolism (HCC) 05/10/2022   Allergic rhinitis 03/15/2021   Gastro-esophageal reflux disease without esophagitis 03/15/2021   Mild persistent asthma, uncomplicated 03/15/2021   Aortic atherosclerosis (HCC) by Chest CT scan on 02/02/2018  01/10/2021   Asthma    Low back pain 03/17/2019   Lumbosacral spondylosis without myelopathy 03/17/2019   Cubital tunnel syndrome 10/14/2018   Closed fracture of distal end of right radius 07/09/2018   Pseudogout 12/09/2017   Major depressive disorder, recurrent, severe without psychotic features (HCC)    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   Suicide attempt by drug ingestion (HCC)    Medication management 08/22/2014   Vitamin D deficiency 08/22/2014   Hyperlipidemia, mixed 08/22/2014   Hypertension    Anxiety     Abnormal glucose    Class 2 severe obesity due to excess calories with serious comorbidity and body mass index (BMI) of 37.0 to 37.9 in adult [facility])     PCP: Lucky Cowboy, MD   REFERRING PROVIDER: Jene Every, MD   REFERRING DIAG: M54.51 (ICD-10-CM) - Vertebrogenic low back pain   Rationale for Evaluation and Treatment: Rehabilitation  THERAPY DIAG:  Other low back pain  Other abnormalities of gait and mobility  Muscle weakness (generalized)  ONSET DATE: >49yr  SUBJECTIVE:  SUBJECTIVE STATEMENT: "I'm hurting today".  Pt reports having increased pain this AM.  She is not sure if she slept wrong or if it's the weather.  Pt denies any adverse effects after prior Rx.  Pt thinks cupping helped last visit.  Pt has been using her TENS unit though did not use it this AM.  Pt states the TENS unit helps. Pt has been unable to perform aquatic exercises this week.  Pt saw her pulmonologist yesterday and received a good report.      PERTINENT HISTORY:  Patient is a very nice 67 yo div WF  with   HTN, HLD, Prediabetes  and Vitamin D Deficiency and had  hx/o recent Covid PNA~ May 2023  and  and in July 2023 was discovered to have a Rt sided PE and was hospitalized for Heparin & transitioned to Eliquis ( to continue 6 months  thru Jan 7 ) .    She was seen 5-6 weeks ago for evaluation for c/o  "shaking & forgetfulness " .  She reported poor short  & long term memory recall.  (Pt discontinued meds as per MD and reports back to baseline with memory)  PAIN:  Are you having pain? Yes: NPRS scale: 5/10 pain in lumbar   Pain location:  bilat sides of lumbar and central lumbar Pain description: ache, dull  Aggravating factors: standing <4 mins; walking >5 mins Relieving factors: Aspen lumbar brace; tylenol, ms  relaxer  PRECAUTIONS: Back and Fall  WEIGHT BEARING RESTRICTIONS: No  FALLS:  Has patient fallen in last 6 months? No  LIVING ENVIRONMENT: Lives with: lives alone Lives in: House/apartment Stairs: Yes: External: 3 steps; none Has following equipment at home: Single point cane  OCCUPATION: retired  PLOF: Independent  PATIENT GOALS: less pain, be able to stand and walk longer, get strength back  NEXT MD VISIT:  OBJECTIVE:    DIAGNOSTIC FINDINGS:  Pt reports recent MRI at emerg [facility] but no results in Epic  4/26 MRI result   CLINICAL DATA: foot pain, soft tissue mass of left third toe US IMPRESSION: Focal tenosynovitis of an extensor tendon in the dorsal foot, with adjacent soft tissue swelling.    TODAY'S TREATMENT:         Review/instruction of TENS unit  Therapeutic Exercise: Reviewed pt presentation, HEP compliance, pain level, and response to prior Rx. Pt performed:            Supine lumbar rotation 2x10            PPT--stopped due to pain Supine marching with TrA x10 Supine shoulder flex/ext with TrA 2x10 Cat/cow in Q-ped- stopped due to pain Child's pose stretch 20sec x3  Pt received supine manual HS and piriformis stretches 2x30 sec each bilat    Manual Therapy: Pt received cupping to bilat lumbar paraspinals in prone to improve soft tissue tightness and mobility and reduce pain.   PATIENT EDUCATION:  Education details: response to cupping and to drink water after cupping, exercise form, HEP and POC Person educated: Patient Education method: Medical illustrator Education comprehension: verbalized understanding  HOME EXERCISE PROGRAM:  Land HEP: (prefers printout) Access Code: [facility] URL: [email or web address] Date: 02/14/2023 Prepared by: Riki Altes  Exercises - Supine Lower Trunk Rotation  - 1-2 x daily - 7 x weekly - 2 sets - 10 reps - 3 seconds hold - Supine Pelvic Tilt  - 1-2 x daily - 7 x weekly - 2 sets -  10 reps - 5seconds  hold - Supine Double Knee to Chest Modified  - 1-2 x daily - 7 x weekly - 2-3 sets - 20seconds hold    Access Code: [email or web address] (prefers printout) URL: [email or web address] Date: 01/07/2023 Prepared by: [facility] - Outpatient Rehab - [address]  Exercises - Walking March  - 1 x daily - 7 x weekly - Side Stepping with Hand Floats  - 1 x daily - 7 x weekly - Seated Straddle on Flotation Forward Breast Stroke Arms and Bicycle Legs  - 1 x daily - 7 x weekly - 3 sets - 10 reps - Shoulder Extension with Resistance  - 1 x daily - 7 x weekly - 1-2 sets - 10 reps - Split Stance Shoulder Row with Resistance  - 1 x daily - 7 x weekly - 1-2 sets - 10 reps - Standing Hip Abduction  - 1 x daily - 7 x weekly - 1-2 sets - 10 reps - Standing Hamstring Stretch on Chair  - 2 x daily - 7 x weekly - 2 reps - 20 seconds  hold - Standing 'L' Stretch at [employer]  - 1 x daily - 7 x weekly - 2 reps  Updated HEP: - Supine March  - 1-2 x daily - 7 x weekly - 2 sets - 10 reps - Supine Transversus Abdominis Bracing - Hands on Stomach  - 2 x daily - 7 x weekly - 2 sets - 10 reps - Supine Core Control with Heel Slide  - 1 x daily - 7 x weekly - 2 sets - 10 reps  ASSESSMENT:  CLINICAL IMPRESSION: Pt presents to PT with increased lumbar pain today.  Pt was limited in certain exercises due to pain.  Pt attempted PPT and Qped cat/cow, but PT had pt stop due to pain.  Pt responds well to cupping and states she feels much better after cupping.  She had an appropriate response to cupping.  Pt continued to have pain after Rx, but responded well to Rx stating she felt better after Rx.  Pt reports improved pain from 5/10 before Rx to 3/10 after Rx.  Pt should benefit from cont skilled PT services to address impairments and goals and to improve overall function.      OBJECTIVE IMPAIRMENTS: Abnormal gait, decreased activity tolerance, decreased balance, decreased endurance, decreased knowledge of  use of DME, decreased mobility, difficulty walking, decreased ROM, decreased strength, obesity, and pain.   ACTIVITY LIMITATIONS: carrying, lifting, bending, sitting, standing, squatting, sleeping, stairs, transfers, bed mobility, and locomotion level  PARTICIPATION LIMITATIONS: cleaning, laundry, shopping, community activity, and yard work  PERSONAL FACTORS: Fitness, Past/current experiences, and 1-2 comorbidities: HTN, ankle dysfunction  are also affecting patient's functional outcome.   REHAB POTENTIAL: Good  CLINICAL DECISION MAKING: Evolving/moderate complexity  EVALUATION COMPLEXITY: Moderate   GOALS: Goals reviewed with patient? Yes  SHORT TERM GOALS: Target date: 01/17/23  Pt will tolerate full aquatic sessions consistently without increase in pain and with improving function to demonstrate good toleration and effectiveness of intervention.   Baseline: Goal status: Met 01/17/23  2.  Pt will improve on 30S sit to stand test to >/= 7 (1/2 of norm value) to demonstrate improving mobility and strength Baseline: 2 Goal status: ongoing 01/21/23  3.  Pt will improve on lumbar ROM by 50% in all areas with decreasing pain Baseline: see chart Goal status: ongoing 02/14/23  4.  Pt will report increased toleration to standing up towards 10 minutes to fix a meal. Baseline: <4 min  Goal status: ongoing 02/14/23 (max 10 mins) Met 02/18/23  5.  Pt will report an overall decrease in max/worst pain to <6/10 to improve sleep pattern and decrease physiological responses to pain Baseline: 8/10 Goal status: ongoing 02/14/23 (8-9/10 due to burning since ablation.)     LONG TERM GOALS: Target date: 02/21/23  Pt to meet stated Foto Goal of 50 Baseline: 37% primary Goal status: Met 02/18/23  2.  Pt will be indep with final HEP's (land and aquatic as appropriate) for continued management of condition  Baseline:  Goal status: IN PROGRESS (received aquatic HEP)  3.  Pt will improve on Berg  balance test to >/= 40/56 to demonstrate a decrease in fall risk.   Baseline: 26/56 Goal status: MET 02/14/23  4.  Pt will improve on Tug test to </=  12s to demonstrate improvement in lower extremity function, mobility and decreased fall risk.  Baseline: 20.49 Goal status: MET 4/12  5.  Pt will improve on strength of bilat hips at least 1 full grade  Baseline: see chart Goal status: IN PROGRESS   6.  Pt will be able to walk through grocery store unlimited by fatigue or pain pushing cart to demonstrate improvement in functional mobility. Baseline: >5 mins Goal status: IN PROGRESS Needs to hold onto cart. Goes to smaller stores. (02/14/23)  PLAN:  PT FREQUENCY: 1-2x/week  PT DURATION: 4 weeks  PLANNED INTERVENTIONS: Therapeutic exercises, Therapeutic activity, Neuromuscular re-education, Balance training, Gait training, Patient/Family education, Self Care, Joint mobilization, Joint manipulation, Stair training, Orthotic/Fit training, DME instructions, Aquatic Therapy, Dry Needling, Electrical stimulation, Spinal manipulation, Spinal mobilization, Cryotherapy, Moist heat, scar mobilization, Splintting, Taping, Traction, Ultrasound, Ionotophoresis 4mg /ml Dexamethasone, Manual therapy, and Re-evaluation.  PLAN FOR NEXT SESSION: continued land based intervention as deemed appropriate by land based PT. Aquatics discontinued.  Cont with cupping if pt continues to respond well.  Audie Clear III PT, DPT 03/14/23 3:31 PM

## 2023-03-18 ENCOUNTER — Encounter (HOSPITAL_BASED_OUTPATIENT_CLINIC_OR_DEPARTMENT_OTHER): Payer: Self-pay | Admitting: Physical Therapy

## 2023-03-18 ENCOUNTER — Ambulatory Visit (HOSPITAL_BASED_OUTPATIENT_CLINIC_OR_DEPARTMENT_OTHER): Payer: Medicare HMO | Admitting: Physical Therapy

## 2023-03-18 DIAGNOSIS — M6281 Muscle weakness (generalized): Secondary | ICD-10-CM | POA: Diagnosis not present

## 2023-03-18 DIAGNOSIS — R2689 Other abnormalities of gait and mobility: Secondary | ICD-10-CM | POA: Diagnosis not present

## 2023-03-18 DIAGNOSIS — M5459 Other low back pain: Secondary | ICD-10-CM | POA: Diagnosis not present
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
## 2023-03-19 ENCOUNTER — Encounter (HOSPITAL_BASED_OUTPATIENT_CLINIC_OR_DEPARTMENT_OTHER): Payer: Medicare HMO | Admitting: Physical Therapy

## 2023-03-19 ENCOUNTER — Encounter (HOSPITAL_BASED_OUTPATIENT_CLINIC_OR_DEPARTMENT_OTHER): Payer: Self-pay | Admitting: Physical Therapy

## 2023-03-19 DIAGNOSIS — J301 Allergic rhinitis due to pollen: Secondary | ICD-10-CM | POA: Diagnosis not present

## 2023-03-23 DIAGNOSIS — F331 Major depressive disorder, recurrent, moderate: Secondary | ICD-10-CM | POA: Diagnosis not present

## 2023-03-23 DIAGNOSIS — F411 Generalized anxiety disorder: Secondary | ICD-10-CM | POA: Diagnosis not present

## 2023-03-23 DIAGNOSIS — Z62820 Parent-biological child conflict: Secondary | ICD-10-CM | POA: Diagnosis not present

## 2023-03-24 ENCOUNTER — Ambulatory Visit: Payer: Medicare HMO | Admitting: Neurology

## 2023-03-24 ENCOUNTER — Encounter: Payer: Self-pay | Admitting: Neurology

## 2023-03-24 VITALS — BP 112/64 | HR 95 | Ht 61.0 in | Wt 226.0 lb

## 2023-03-24 DIAGNOSIS — Z6841 Body Mass Index (BMI) 40.0 and over, adult: Secondary | ICD-10-CM | POA: Diagnosis not present

## 2023-03-24 DIAGNOSIS — Z9189 Other specified personal risk factors, not elsewhere classified: Secondary | ICD-10-CM

## 2023-03-24 DIAGNOSIS — E661 Drug-induced obesity: Secondary | ICD-10-CM | POA: Diagnosis not present

## 2023-03-24 DIAGNOSIS — G4719 Other hypersomnia: Secondary | ICD-10-CM

## 2023-03-24 DIAGNOSIS — R0683 Snoring: Secondary | ICD-10-CM | POA: Diagnosis not present

## 2023-03-24 DIAGNOSIS — G4734 Idiopathic sleep related nonobstructive alveolar hypoventilation: Secondary | ICD-10-CM | POA: Diagnosis not present

## 2023-03-24 DIAGNOSIS — E66813 Obesity, class 3: Secondary | ICD-10-CM

## 2023-03-24 NOTE — Progress Notes (Signed)
SLEEP MEDICINE CLINIC    Provider:  Melvyn Novas, MD  Primary Care Physician:  Lucky Cowboy, MD [address] [place] [address]     Referring Provider: [place] Cowboy, Md [address] [place],  [place] [address]          Chief Complaint according to patient   Patient presents with:     New Patient (Initial Visit)     Pt alone, rm 1. Referred by PCP-Here today for OSA eval. She has noticed that oxygen level is dropping into the 70's when sleeping according to watch. She avg 4-6 hours at night but not well rested. Snores in sleep. Denies waking up trying to catch breath. She may wake up 1-2 times a night to void. Still tired when she wakes up.       HISTORY OF PRESENT ILLNESS:  Haley White is a 67 y.o. female patient who is seen upon referral on 03/24/2023 from PCP Dr Delila Spence for a Sleep consultation.  Chief concern according to patient :  " My watch tells me I don't get enough sleep and I am hypoxic"   "My children and my sister have all told me repeatedly about how badly I snore when sleeping. I have a [employer] that tracts my sleeping. I just discovered today that it not only tracts my sleeping, but also my blood oxygen level as I sleep. When I saw it for the first time this morning, it gave 80% as my blood oxygen level. I became immediately alarmed, as ever since being diagnosed with Asthma and especially since my bad case of Covid last summer, I have kept tabs on my oxygen levels. A friend of mine has Sleep Apnea and said I really ought to contact my doctor and get a referral for a sleep study. 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    She reports non restorative sleep, Nocturia 1-2 no sleep waking headaches but sometimes morning headaches, pain in left foot, back pain with ablation procedure, she was Sleep walking in childhood, left bedroom many times,  sleep talking, no thyroid disease but  prediabetes, and seasonal depressive disorder.     Family medical /sleep history: First paternal cousin with sleep apnea. no other family member on CPAP with OSA, insomnia, sleep walkers.    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 One dog and cat.  Tobacco use: quit at age 18- was smoking  in college -3 years.   ETOH use : 4-6 / week,  Caffeine intake in form of Coffee( 2 cups in AM ) Soda( some times, 3 /week-) Tea ( when eating out. ) no energy drinks Exercise in form of swimming .   Hobbies :walking, hiking- but back and foot pain limited this.    Sleep habits are as follows: The patient's dinner time is between 6-7 PM. The patient goes to bed at 11-12 PM and continues to sleep for 3-6 hours, wakes for 1-2 bathroom breaks. The preferred sleep position is sideways, with the support of 1 pillow.  Dreams are reportedly frequent/vivid. Nightly.   The patient wakes up spontaneously 7-8 AM , 8  AM is the usual rise time. She reports not feeling refreshed or restored in AM, with symptoms such as dry  mouth, some morning headaches, and residual fatigue.  Naps are taken every day , lasting from 1-2 Hours  after lunch and are more refreshing  for a limited time.    Review of Systems: Out of a complete 14 system review, the patient complains of only the following symptoms, and all other reviewed systems are negative.:  Fatigue, sleepiness , snoring, fragmented sleep  Pain.      How likely are you to doze in the following situations: 0 = not likely, 1 = slight chance, 2 = moderate chance, 3 = high chance   Sitting and Reading? Watching Television? Sitting inactive in a public place (theater or meeting)? As a passenger in a car for an hour without a break? Lying down in the afternoon when circumstances permit? Sitting and talking to someone? Sitting quietly after  lunch without alcohol? In a car, while stopped for a few minutes in traffic?   Total = 13-14/ 24 points   FSS endorsed at 36/ 63 points.  GDS 3/ 15   Social History   Socioeconomic History   Marital status: Divorced    Spouse name: Not on file   Number of children: Not on file   Years of education: Not on file   Highest education level: Not on file  Occupational History   Not on file  Tobacco Use   Smoking status: Former    Packs/day: 1.00    Years: 7.00    Additional pack years: 0.00    Total pack years: 7.00    Types: Cigarettes    Quit date: 11/04/1977    Years since quitting: 45.4   Smokeless tobacco: Never  Vaping Use   Vaping Use: Never used  Substance and Sexual Activity   Alcohol use: Yes    Alcohol/week: 4.0 standard drinks of alcohol    Types: 4 Glasses of wine per week   Drug use: No    Comment: 06/03/2022-gummies   Sexual activity: Yes    Birth control/protection: Surgical  Other Topics Concern   Not on file  Social History Narrative   Not on file   Social Determinants of Health   Financial Resource Strain: Not on file  Food Insecurity: Not on file  Transportation Needs: Not on file  Physical Activity: Not on file  Stress: Not on file  Social Connections: Not on file    Family History  Problem Relation Age of Onset   Dementia Father    Anxiety disorder Sister    Colon cancer Neg Hx    Colon polyps Neg Hx    Esophageal cancer Neg Hx    Stomach cancer Neg Hx    Rectal cancer Neg Hx     Past Medical History:  Diagnosis Date   Anxiety    Arthritis    Asthma    Cataract    Gout    Hyperlipidemia    Hypertension    Obese     Past Surgical History:  Procedure Laterality Date   APPENDECTOMY  1984   CATARACT EXTRACTION W/ INTRAOCULAR LENS  IMPLANT, BILATERAL  2014   CESAREAN SECTION  1984, 1987, 1994   X3   COLONOSCOPY  11/16/2013   w/Brodie    FOOT SURGERY Left 2002   KNEE SURGERY Left 1998   ROTATOR CUFF REPAIR Right 2007    TUBAL LIGATION  1994     Current Outpatient Medications on File Prior to Visit  Medication Sig Dispense Refill   albuterol (VENTOLIN HFA) 108 (90 Base)  MCG/ACT inhaler Inhale 2 puffs into the lungs every 4 (four) hours as needed for wheezing or shortness of breath. 1 Inhaler 0   buPROPion (WELLBUTRIN XL) 300 MG 24 hr tablet Take 300 mg by mouth every morning.     EPINEPHrine 0.3 mg/0.3 mL IJ SOAJ injection See admin instructions.     FLUoxetine (PROZAC) 20 MG capsule Take 60 mg by mouth daily.     fluticasone (FLONASE) 50 MCG/ACT nasal spray Place 2 sprays into both nostrils daily.  3   levocetirizine (XYZAL) 5 MG tablet Take 5 mg by mouth every evening.  3   lisinopril (ZESTRIL) 20 MG tablet Take  1 tablet  Daily for BP                                                            /                                    TAKE                                         BY                                        MOUTH 90 tablet 3   methocarbamol (ROBAXIN) 500 MG tablet Take 1 tablet (500 mg total) by mouth every 8 (eight) hours as needed for muscle spasms. 21 tablet 0   methylphenidate (RITALIN) 10 MG tablet Take 10 mg by mouth 2 (two) times daily.  0   montelukast (SINGULAIR) 10 MG tablet Take 10 mg by mouth daily.     pantoprazole (PROTONIX) 40 MG tablet Take 1 tablet  Daily to Prevent Indigestion & Heartburn                                      /                        TAKE                       BY                       MOUTH 90 tablet 3   rosuvastatin (CRESTOR) 40 MG tablet Take 40 mg by mouth daily.     SYMBICORT 160-4.5 MCG/ACT inhaler Inhale 2 puffs into the lungs 2 (two) times daily.  6   VITAMIN D PO Take 10,000 Units by mouth daily.     No current facility-administered medications on file prior to visit.    Allergies  Allergen Reactions   Molds & Smuts Anaphylaxis   Biaxin [Clarithromycin]     GI upset   Prednisone     Was admitted for self harm when on prednisone before   Serevent  [Salmeterol]     Palpitations   Tequin [Gatifloxacin]  GI upset. thrush     DIAGNOSTIC DATA (LABS, IMAGING, TESTING) - I reviewed patient records, labs, notes, testing and imaging myself where available.  Lab Results  Component Value Date   WBC 6.9 01/22/2023   HGB 13.7 01/22/2023   HCT 41.2 01/22/2023   MCV 94.5 01/22/2023   PLT 297 01/22/2023      Component Value Date/Time   NA 143 01/22/2023 1152   NA 144 10/31/2021 1420   K 4.6 01/22/2023 1152   CL 106 01/22/2023 1152   CO2 25 01/22/2023 1152   GLUCOSE 101 (H) 01/22/2023 1152   BUN 17 01/22/2023 1152   BUN 12 10/31/2021 1420   CREATININE 0.81 01/22/2023 1152   CALCIUM 9.7 01/22/2023 1152   PROT 6.6 01/22/2023 1152   ALBUMIN 3.9 04/14/2022 1629   AST 20 01/22/2023 1152   ALT 24 01/22/2023 1152   ALKPHOS 54 04/14/2022 1629   BILITOT 0.6 01/22/2023 1152   GFRNONAA >60 05/10/2022 2015   GFRNONAA 56 (L) 01/10/2021 0852   GFRAA 65 01/10/2021 0852   Lab Results  Component Value Date   CHOL 172 01/22/2023   HDL 88 01/22/2023   LDLCALC 65 01/22/2023   TRIG 108 01/22/2023   CHOLHDL 2.0 01/22/2023   Lab Results  Component Value Date   HGBA1C 6.0 (H) 01/22/2023   Lab Results  Component Value Date   VITAMINB12 511 01/10/2021   Lab Results  Component Value Date   TSH 0.95 01/22/2023    PHYSICAL EXAM:  Today's Vitals   03/24/23 0911  BP: 112/64  Pulse: 95  Weight: 226 lb (102.5 kg)  Height: 5\' 1"  (1.549 m)   Body mass index is 42.7 kg/m.   Wt Readings from Last 3 Encounters:  03/24/23 226 lb (102.5 kg)  03/13/23 219 lb (99.3 kg)  01/22/23 220 lb (99.8 kg)     Ht Readings from Last 3 Encounters:  03/24/23 5\' 1"  (1.549 m)  03/13/23 5\' 1"  (1.549 m)  01/22/23 5\' 2"  (1.575 m)      General: The patient is awake, alert and appears not in acute distress. The patient is well groomed. Head: Normocephalic, atraumatic. Neck is supple. Mallampati 3,  neck circumference:15. 75 inches.  Nasal airflow is  patent.  Retrognathia is not seen.  Dental status: biological, wore braces and retainer.  Cardiovascular:  Regular rate and cardiac rhythm by pulse,  without distended neck veins. Respiratory: Lungs are clear to auscultation.  Skin:  Without evidence of ankle edema, or rash. Trunk: The patient's posture is erect.   NEUROLOGIC EXAM: The patient is awake and alert, oriented to place and time.   Memory subjective described as intact.  Attention span & concentration ability appears normal.  Speech is fluent,  without  dysarthria, dysphonia or aphasia.  Mood and affect are appropriate.   Cranial nerves: no loss of smell or taste reported  Pupils are equal and briskly reactive to light. Funduscopic exam deferred.  Extraocular movements in vertical and horizontal planes were intact and without nystagmus. No Diplopia. Visual fields by finger perimetry are intact. Hearing was intact to soft voice and finger rubbing.    Facial sensation intact to fine touch.  Facial motor strength is symmetric and tongue and uvula move midline.  Neck ROM : rotation, tilt and flexion extension were normal for age and shoulder shrug was symmetrical.    Motor exam:  Symmetric bulk, tone and ROM.   Normal tone without cog wheeling, symmetric grip strength .  Sensory:  Fine touch, pinprick and vibration were tested - the left ffot was numb to b vibration at ankle level.  Proprioception tested in the upper extremities was normal.   Coordination: Rapid alternating movements in the fingers/hands were of normal speed.  The Finger-to-nose maneuver was intact without evidence of ataxia, dysmetria or tremor.   Gait and station: Patient could rise unassisted from a seated position, walked without assistive device.  Stance is of normal width/ base .  Toe and heel walk were deferred.  Deep tendon reflexes: in the  upper and lower extremities are symmetric and intact.  Babinski response was deferred.     ASSESSMENT AND  PLAN 67 y.o. year old female  here with: "My children and my sister have all told me repeatedly about how badly I snore when sleeping. I have a [employer] that tracts my sleeping. I just discovered today that it not only tracts my sleeping, but also my blood oxygen level as I sleep. When I saw it for the first time this morning, it gave 80% as my blood oxygen level. I became immediately alarmed, as ever since being diagnosed with Asthma and especially since my bad case of Covid last summer, I have kept tabs on my oxygen levels. A friend of mine has Sleep Apnea and said I really ought to contact my doctor and get a referral for a sleep study. "    1) Known snoring, known sleep talking.  Weight gain due to immobility- ankle and foot injuries.   2)Non restorative sleep and excessive daytime sleepiness. Daytime naps.   3) vivid dreams.   4) her smart watch reports hypoxia and very low sleep efficiency. This patient  had Covid 19 in 2023, and developed pulmonary embolisms.    Post viral fatigue and pulmonary primary dysfunction are in the differential.  High risk for OSA by BMI - now at 42,  pre-diabetes, large neck, and high grade airway obstruction. like to screen for OSA by a HST, which also can verify presence of hypoxia.  She lives alone with nobody to witness her sleep.   I plan to follow up either personally or through our NP within 3-4  months.   I would like to thank Lucky Cowboy, MD for allowing me to meet with and to take care of this pleasant patient.   CC: I will share my notes with PCP .  After spending a total time of  35  minutes face to face and additional time for physical and neurologic examination, review of laboratory studies,  personal review of imaging studies, reports and results of other testing and review of referral information / records as far as provided in visit,   Electronically signed by: Melvyn Novas, MD 03/24/2023 9:19 AM  [facility] Board certified by The [employer] of Sleep Medicine and Diplomate of the [employer] of Sleep Medicine. Board certified In Neurology through the ABPN, Fellow of the [employer] of Neurology. Medical Director of [facility].

## 2023-03-24 NOTE — Patient Instructions (Addendum)
1) Known snoring, known sleep talking.  Weight gain due to immobility- ankle and foot injuries.    2)Non restorative sleep and excessive daytime sleepiness. Daytime naps.    3) vivid dreams.    4) her smart watch reports hypoxia and very low sleep efficiency.   High risk for OSA by BMI - now at 42,  pre-diabetes, large neck, and high grade airway obstruction. like to screen for OSA by a HST, which also can verify presence of hypoxia.  She lives alone with nobody to witness her sleep.    I plan to follow up either personally or through our NP within 3-4  months.   Quality Sleep Information, Adult Quality sleep is important for your mental and physical health. It also improves your quality of life. Quality sleep means you: Are asleep for most of the time you are in bed. Fall asleep within 30 minutes. Wake up no more than once a night. Are awake for no longer than 20 minutes if you do wake up during the night. Most adults need 7-8 hours of quality sleep each night. How can poor sleep affect me? If you do not get enough quality sleep, you may have: Mood swings. Daytime sleepiness. Decreased alertness, reaction time, and concentration. Sleep disorders, such as insomnia and sleep apnea. Difficulty with: Solving problems. Coping with stress. Paying attention. These issues may affect your performance and productivity at work, school, and home. Lack of sleep may also put you at higher risk for accidents, suicide, and risky behaviors. If you do not get quality sleep, you may also be at higher risk for several health problems, including: Infections. Type 2 diabetes. Heart disease. High blood pressure. Obesity. Worsening of long-term conditions, like arthritis, kidney disease, depression, Parkinson's disease, and epilepsy. What actions can I take to get more quality sleep? Sleep schedule and routine Stick to a sleep schedule. Go to sleep and wake up at about the same time each day. Do not  try to sleep less on weekdays and make up for lost sleep on weekends. This does not work. Limit naps during the day to 30 minutes or less. Do not take naps in the late afternoon. Make time to relax before bed. Reading, listening to music, or taking a hot bath promotes quality sleep. Make your bedroom a place that promotes quality sleep. Keep your bedroom dark, quiet, and at a comfortable room temperature. Make sure your bed is comfortable. Avoid using electronic devices that give off bright blue light for 30 minutes before bedtime. Your brain perceives bright blue light as sunlight. This includes television, phones, and computers. If you are lying awake in bed for longer than 20 minutes, get up and do a relaxing activity until you feel sleepy. Lifestyle     Try to get at least 30 minutes of exercise on most days. Do not exercise 2-3 hours before going to bed. Do not use any products that contain nicotine or tobacco. These products include cigarettes, chewing tobacco, and vaping devices, such as e-cigarettes. If you need help quitting, ask your health care provider. Do not drink caffeinated beverages for at least 8 hours before going to bed. Coffee, tea, and some sodas contain caffeine. Do not drink alcohol or eat large meals close to bedtime. Try to get at least 30 minutes of sunlight every day. Morning sunlight is best. Medical concerns Work with your health care provider to treat medical conditions that may affect sleeping, such as: Nasal obstruction. Snoring. Sleep apnea and  other sleep disorders. Talk to your health care provider if you think any of your prescription medicines may cause you to have difficulty falling or staying asleep. If you have sleep problems, talk with a sleep consultant. If you think you have a sleep disorder, talk with your health care provider about getting evaluated by a specialist. Where to find more information Sleep Foundation: sleepfoundation.org American  Academy of Sleep Medicine: aasm.org Centers for Disease Control and Prevention (CDC): TonerPromos.no Contact a health care provider if: You have trouble getting to sleep or staying asleep. You often wake up very early in the morning and cannot get back to sleep. You have daytime sleepiness. You have daytime sleep attacks of suddenly falling asleep and sudden muscle weakness (narcolepsy). You have a tingling sensation in your legs with a strong urge to move your legs (restless legs syndrome). You stop breathing briefly during sleep (sleep apnea). You think you have a sleep disorder or are taking a medicine that is affecting your quality of sleep. Summary Most adults need 7-8 hours of quality sleep each night. Getting enough quality sleep is important for your mental and physical health. Make your bedroom a place that promotes quality sleep, and avoid things that may cause you to have poor sleep, such as alcohol, caffeine, smoking, or large meals. Talk to your health care provider if you have trouble falling asleep or staying asleep. This information is not intended to replace advice given to you by your health care provider. Make sure you discuss any questions you have with your health care provider. Document Revised: 02/13/2022 Document Reviewed: 02/13/2022 Elsevier Patient Education  2023 [employer].

## 2023-03-25 ENCOUNTER — Encounter (HOSPITAL_BASED_OUTPATIENT_CLINIC_OR_DEPARTMENT_OTHER): Payer: Self-pay

## 2023-03-25 ENCOUNTER — Ambulatory Visit (HOSPITAL_BASED_OUTPATIENT_CLINIC_OR_DEPARTMENT_OTHER): Payer: Medicare HMO

## 2023-03-25 DIAGNOSIS — M6281 Muscle weakness (generalized): Secondary | ICD-10-CM | POA: Diagnosis not present

## 2023-03-25 DIAGNOSIS — J3081 Allergic rhinitis due to animal (cat) (dog) hair and dander: Secondary | ICD-10-CM | POA: Diagnosis not present

## 2023-03-25 DIAGNOSIS — J301 Allergic rhinitis due to pollen: Secondary | ICD-10-CM | POA: Diagnosis not present

## 2023-03-25 DIAGNOSIS — R2689 Other abnormalities of gait and mobility: Secondary | ICD-10-CM | POA: Diagnosis not present

## 2023-03-25 DIAGNOSIS — M79642 Pain in left hand: Secondary | ICD-10-CM | POA: Diagnosis not present

## 2023-03-25 DIAGNOSIS — J3089 Other allergic rhinitis: Secondary | ICD-10-CM | POA: Diagnosis not present

## 2023-03-25 DIAGNOSIS — M5459 Other low back pain: Secondary | ICD-10-CM | POA: Diagnosis not present

## 2023-03-25 DIAGNOSIS — M79645 Pain in left finger(s): Secondary | ICD-10-CM | POA: Diagnosis not present

## 2023-03-25 DIAGNOSIS — G5602 Carpal tunnel syndrome, left upper limb: Secondary | ICD-10-CM | POA: Diagnosis not present

## 2023-03-25 NOTE — Therapy (Signed)
OUTPATIENT PHYSICAL THERAPY THORACOLUMBAR  TREATMENT    Patient Name: Haley White MRN: [id number] DOB:May 15, 1956, 67 y.o., female Today's Date: 03/25/2023  END OF SESSION:  PT End of Session - 03/25/23 0758     Visit Number 21    Number of Visits 24    Date for PT Re-Evaluation 04/15/23    Authorization Type aetna medicare    Progress Note Due on Visit 30    PT Start Time 0800    PT Stop Time 0843    PT Time Calculation (min) 43 min    Activity Tolerance Patient tolerated treatment well    Behavior During Therapy WFL for tasks assessed/performed                  Past Medical History:  Diagnosis Date   Anxiety    Arthritis    Asthma    Cataract    Gout    Hyperlipidemia    Hypertension    Obese    Past Surgical History:  Procedure Laterality Date   APPENDECTOMY  1984   CATARACT EXTRACTION W/ INTRAOCULAR LENS  IMPLANT, BILATERAL  2014   CESAREAN SECTION  1984, 1987, 1994   X3   COLONOSCOPY  11/16/2013   w/Brodie    FOOT SURGERY Left 2002   KNEE SURGERY Left 1998   ROTATOR CUFF REPAIR Right 2007   TUBAL LIGATION  1994   Patient Active Problem List   Diagnosis Date Noted   Snoring 03/24/2023   Sleep-related hypoxia 03/24/2023   Excessive daytime sleepiness 03/24/2023   At risk for obstructive sleep apnea 03/24/2023   DOE (dyspnea on exertion) 05/10/2022   Acute pulmonary embolism (HCC) 05/10/2022   Allergic rhinitis 03/15/2021   Gastro-esophageal reflux disease without esophagitis 03/15/2021   Mild persistent asthma, uncomplicated 03/15/2021   Aortic atherosclerosis (HCC) by Chest CT scan on 02/02/2018  01/10/2021   Asthma    Low back pain 03/17/2019   Lumbosacral spondylosis without myelopathy 03/17/2019   Cubital tunnel syndrome 10/14/2018   Closed fracture of distal end of right radius 07/09/2018   Pseudogout 12/09/2017   Major depressive disorder, recurrent, severe without psychotic features (HCC)    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    Suicide attempt by drug ingestion (HCC)    Medication management 08/22/2014   Vitamin D deficiency 08/22/2014   Hyperlipidemia, mixed 08/22/2014   Hypertension    Anxiety    Abnormal glucose    Class 2 severe obesity due to excess calories with serious comorbidity and body mass index (BMI) of 37.0 to 37.9 in adult [facility])     PCP: Lucky Cowboy, MD   REFERRING PROVIDER: Jene Every, MD   REFERRING DIAG: M54.51 (ICD-10-CM) - Vertebrogenic low back pain   Rationale for Evaluation and Treatment: Rehabilitation  THERAPY DIAG:  Other low back pain  Other abnormalities of gait and mobility  Muscle weakness (generalized)  ONSET DATE: >68yr  SUBJECTIVE:  SUBJECTIVE STATEMENT: Pt reports "today's a good day. I think this is working." Pt denies pain in back at entry. Went to [place] this weekend and did have some back pain with the increased activity. Has been using TENS unit with good response. Sees MD for L hand assessment today.    PERTINENT HISTORY:  Patient is a very nice 67 yo div WF  with   HTN, HLD, Prediabetes  and Vitamin D Deficiency and had  hx/o recent Covid PNA~ May 2023  and  and in July 2023 was discovered to have a Rt sided PE and was hospitalized for Heparin & transitioned to Eliquis ( to continue 6 months  thru Jan 7 ) .    She was seen 5-6 weeks ago for evaluation for c/o  "shaking & forgetfulness " .  She reported poor short  & long term memory recall.  (Pt discontinued meds as per MD and reports back to baseline with memory)  PAIN:  Are you having pain? Yes: NPRS scale: 0/10 sitting and 4/10 with ambulation current, 8/10 worst, 0/10 worst   Pain location:  bilat sides of lumbar    PRECAUTIONS: Back and Fall  WEIGHT BEARING RESTRICTIONS: No  FALLS:  Has patient fallen in  last 6 months? No  LIVING ENVIRONMENT: Lives with: lives alone Lives in: House/apartment Stairs: Yes: External: 3 steps; none Has following equipment at home: Single point cane  OCCUPATION: retired  PLOF: Independent  PATIENT GOALS: less pain, be able to stand and walk longer, get strength back  NEXT MD VISIT:  OBJECTIVE:    DIAGNOSTIC FINDINGS:  Pt reports recent MRI at emerg [facility] but no results in Epic  4/26 MRI result   CLINICAL DATA: foot pain, soft tissue mass of left third toe US IMPRESSION: Focal tenosynovitis of an extensor tendon in the dorsal foot, with adjacent soft tissue swelling.    MEASUREMENTS:        LUMBAR ROM:    AROM eval 01/21/23 4/12 5/14  Flexion Finger tips to knees To knees To knees To mid shin with pain returning to standing  Extension 25% 50% 50%P! 50% with pain  Right lateral flexion 10% 25% 75% 80%  Left lateral flexion 25% 50% 65% 65% with pain  Right rotation 50% 50% 75% WFL  Left rotation 50% 50% 50% 75%     LOWER EXTREMITY MMT:                                                P!=Pain MMT Right eval Left eval 4/12 L/R 5/14  Hip flexion 3+P! 3+P! Tested supine 4-P!/4 Tested seated 5/5 bilat without pain  Hip extension 3+P! 3+P!     Hip abduction 5 5     Hip adduction 5 5     Hip internal rotation         Hip external rotation         Knee flexion 5 5     Knee extension       5/5 bilat  Ankle dorsiflexion 5 5     Ankle plantarflexion         Ankle inversion         Ankle eversion          (Blank rows = not tested)   LUMBAR SPECIAL TESTS:  Straight leg raise test: Positive  bilat  FOTO:  Prior/Current:  65 / 56  met goal.    TREATMENT:   Therapeutic Exercise: LTR x15 PPT 5" x20 DKTC 5"  2x10 with physioball under knees Standing PNF chop x10ea    Child's pose stretch 20sec x3  Counter "L" stretch- 10sec x4 Row with TrA- GTB 2x10 (watch hand pain) Standing march with trunk rotation/oblique crunch to same side  x10ea (SBA for balance)   Manual Therapy: Pt received STM and cupping to bilat lumbar paraspinals in prone to improve soft tissue tightness and mobility and reduce pain   PATIENT EDUCATION:  Education details: objective findings, rationale of interventions, exercise form, HEP and POC Person educated: Patient Education method: Medical illustrator Education comprehension: verbalized understanding  HOME EXERCISE PROGRAM:  Land HEP: (prefers printout) Access Code: [facility] URL: [email or web address] Date: 02/14/2023 Prepared by: Riki Altes  Exercises - Supine Lower Trunk Rotation  - 1-2 x daily - 7 x weekly - 2 sets - 10 reps - 3 seconds hold - Supine Pelvic Tilt  - 1-2 x daily - 7 x weekly - 2 sets - 10 reps - 5seconds hold - Supine Double Knee to Chest Modified  - 1-2 x daily - 7 x weekly - 2-3 sets - 20seconds hold    Access Code: [email or web address] (prefers printout) URL: [email or web address] Date: 01/07/2023 Prepared by: [facility] - Outpatient Rehab - [address]  Exercises - Walking March  - 1 x daily - 7 x weekly - Side Stepping with Hand Floats  - 1 x daily - 7 x weekly - Seated Straddle on Flotation Forward Breast Stroke Arms and Bicycle Legs  - 1 x daily - 7 x weekly - 3 sets - 10 reps - Shoulder Extension with Resistance  - 1 x daily - 7 x weekly - 1-2 sets - 10 reps - Split Stance Shoulder Row with Resistance  - 1 x daily - 7 x weekly - 1-2 sets - 10 reps - Standing Hip Abduction  - 1 x daily - 7 x weekly - 1-2 sets - 10 reps - Standing Hamstring Stretch on Chair  - 2 x daily - 7 x weekly - 2 reps - 20 seconds  hold - Standing 'L' Stretch at [employer]  - 1 x daily - 7 x weekly - 2 reps   - Supine March  - 1-2 x daily - 7 x weekly - 2 sets - 10 reps - Supine Transversus Abdominis Bracing - Hands on Stomach  - 2 x daily - 7 x weekly - 2 sets - 10 reps - Supine Core Control with Heel Slide  - 1 x daily - 7 x weekly - 2 sets - 10  reps  ASSESSMENT:  CLINICAL IMPRESSION: Pt continues to benefit from manual intervention including sTM and cupping to address ongoing tightness in lumbar and lower thoracic paraspinals. She had intense stretch with DKTC so switched to ball under knees today for more tolerable stretch. She denied pain with most exercises. Challenged core strength through progression including PNF chops and standing oblique marches. Will assess response to session next visit and progress as tolerated.         OBJECTIVE IMPAIRMENTS: Abnormal gait, decreased activity tolerance, decreased balance, decreased endurance, decreased knowledge of use of DME, decreased mobility, difficulty walking, decreased ROM, decreased strength, obesity, and pain.   ACTIVITY LIMITATIONS: carrying, lifting, bending, sitting, standing, squatting, sleeping, stairs, transfers, bed mobility, and locomotion level  PARTICIPATION LIMITATIONS: cleaning, laundry, shopping, community activity, and yard work  PERSONAL FACTORS: Fitness, Past/current experiences, and 1-2 comorbidities: HTN, ankle dysfunction  are also affecting patient's functional outcome.   REHAB POTENTIAL: Good  CLINICAL DECISION MAKING: Evolving/moderate complexity  EVALUATION COMPLEXITY: Moderate   GOALS: Goals reviewed with patient? Yes  SHORT TERM GOALS: Target date: 01/17/23  Pt will tolerate full aquatic sessions consistently without increase in pain and with improving function to demonstrate good toleration and effectiveness of intervention.   Baseline: Goal status: Met 01/17/23  2.  Pt will improve on 30S sit to stand test to >/= 7 (1/2 of norm value) to demonstrate improving mobility and strength Baseline: 2 Goal status: MET  3.  Pt will improve on lumbar ROM by 50% in all areas with decreasing pain Baseline: see chart Goal status: ongoing 02/14/23  4.  Pt will report increased toleration to standing up towards 10 minutes to fix a meal. Baseline: <4 min   Goal status: ongoing 02/14/23 (max 10 mins) Met 02/18/23  5.  Pt will report an overall decrease in max/worst pain to <6/10 to improve sleep pattern and decrease physiological responses to pain Baseline: 8/10 Goal status: ongoing 8/10   03/18/23     LONG TERM GOALS: Target date: 02/21/23  Pt to meet stated Foto Goal of 50 Baseline: 37% primary Goal status: Met 02/18/23  2.  Pt will be indep with final HEP's (land and aquatic as appropriate) for continued management of condition  Baseline:  Goal status: IN PROGRESS (received aquatic HEP) Target date:  04/15/23  3.  Pt will improve on Berg balance test to >/= 40/56 to demonstrate a decrease in fall risk.   Baseline: 26/56 Goal status: MET 02/14/23  4.  Pt will improve on Tug test to </=  12s to demonstrate improvement in lower extremity function, mobility and decreased fall risk.  Baseline: 20.49 Goal status: MET 4/12  5.  Pt will improve on strength of bilat hips at least 1 full grade  Baseline: see chart Goal status: IN PROGRESS  Target date:  04/15/23  6.  Pt will be able to walk through grocery store unlimited by fatigue or pain pushing cart to demonstrate improvement in functional mobility. Baseline: >5 mins Goal status: IN PROGRESS Needs to hold onto cart. (03/18/23) Target date:  04/15/23  PLAN:  PT FREQUENCY: 1-2x/week  PT DURATION: 2 - 4 weeks  PLANNED INTERVENTIONS: Therapeutic exercises, Therapeutic activity, Neuromuscular re-education, Balance training, Gait training, Patient/Family education, Self Care, Joint mobilization, Joint manipulation, Stair training, Orthotic/Fit training, DME instructions, Aquatic Therapy, Dry Needling, Electrical stimulation, Spinal manipulation, Spinal mobilization, Cryotherapy, Moist heat, scar mobilization, Splintting, Taping, Traction, Ultrasound, Ionotophoresis 4mg /ml Dexamethasone, Manual therapy, and Re-evaluation.  PLAN FOR NEXT SESSION: continued land based intervention for core  strengthening, flexibility, and manual therapy.  Cont with cupping if pt continues to respond well.   Riki Altes, PTA  03/25/23 9:47 AM

## 2023-03-26 ENCOUNTER — Telehealth: Payer: Self-pay | Admitting: Neurology

## 2023-03-26 NOTE — Telephone Encounter (Signed)
HST- [employer] medicare no auth req'd   Patient is scheduled at [facility] for 04/16/23 at 9 AM.  Sent patient a email with appointment information.

## 2023-03-27 ENCOUNTER — Ambulatory Visit (HOSPITAL_BASED_OUTPATIENT_CLINIC_OR_DEPARTMENT_OTHER): Payer: Medicare HMO | Admitting: Physical Therapy

## 2023-03-27 DIAGNOSIS — M5459 Other low back pain: Secondary | ICD-10-CM | POA: Diagnosis not present

## 2023-03-27 DIAGNOSIS — R2689 Other abnormalities of gait and mobility: Secondary | ICD-10-CM

## 2023-03-27 DIAGNOSIS — M6281 Muscle weakness (generalized): Secondary | ICD-10-CM | POA: Diagnosis not present

## 2023-03-27 NOTE — Therapy (Signed)
OUTPATIENT PHYSICAL THERAPY THORACOLUMBAR  TREATMENT    Patient Name: Haley White MRN: [id number] DOB:December 12, 1955, 67 y.o., female Today's Date: 03/28/2023  END OF SESSION:  PT End of Session - 03/27/23 1534     Visit Number 22    Number of Visits 24    Date for PT Re-Evaluation 04/15/23    Authorization Type aetna medicare    PT Start Time 1451    PT Stop Time 1530    PT Time Calculation (min) 39 min    Activity Tolerance Patient tolerated treatment well    Behavior During Therapy WFL for tasks assessed/performed                   Past Medical History:  Diagnosis Date   Anxiety    Arthritis    Asthma    Cataract    Gout    Hyperlipidemia    Hypertension    Obese    Past Surgical History:  Procedure Laterality Date   APPENDECTOMY  1984   CATARACT EXTRACTION W/ INTRAOCULAR LENS  IMPLANT, BILATERAL  2014   CESAREAN SECTION  1984, 1987, 1994   X3   COLONOSCOPY  11/16/2013   w/Brodie    FOOT SURGERY Left 2002   KNEE SURGERY Left 1998   ROTATOR CUFF REPAIR Right 2007   TUBAL LIGATION  1994   Patient Active Problem List   Diagnosis Date Noted   Snoring 03/24/2023   Sleep-related hypoxia 03/24/2023   Excessive daytime sleepiness 03/24/2023   At risk for obstructive sleep apnea 03/24/2023   DOE (dyspnea on exertion) 05/10/2022   Acute pulmonary embolism (HCC) 05/10/2022   Allergic rhinitis 03/15/2021   Gastro-esophageal reflux disease without esophagitis 03/15/2021   Mild persistent asthma, uncomplicated 03/15/2021   Aortic atherosclerosis (HCC) by Chest CT scan on 02/02/2018  01/10/2021   Asthma    Low back pain 03/17/2019   Lumbosacral spondylosis without myelopathy 03/17/2019   Cubital tunnel syndrome 10/14/2018   Closed fracture of distal end of right radius 07/09/2018   Pseudogout 12/09/2017   Major depressive disorder, recurrent, severe without psychotic features (HCC)    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   Suicide attempt by drug  ingestion (HCC)    Medication management 08/22/2014   Vitamin D deficiency 08/22/2014   Hyperlipidemia, mixed 08/22/2014   Hypertension    Anxiety    Abnormal glucose    Class 2 severe obesity due to excess calories with serious comorbidity and body mass index (BMI) of 37.0 to 37.9 in adult [facility])     PCP: Lucky Cowboy, MD   REFERRING PROVIDER: Jene Every, MD   REFERRING DIAG: M54.51 (ICD-10-CM) - Vertebrogenic low back pain   Rationale for Evaluation and Treatment: Rehabilitation  THERAPY DIAG:  Other low back pain  Other abnormalities of gait and mobility  Muscle weakness (generalized)  ONSET DATE: >25yr  SUBJECTIVE:  SUBJECTIVE STATEMENT: Pt states she felt great after prior Rx.  Pt reports she was able to walk from parking lot to where her mother gets her hair cut without pain which is a first.  "I really feel like I am beginning to make progress".  Pt states her spine is good, she thinks the ablation worked.  Pt denies pain currently.  Pt has been having L hand pain for a couple of months.  She had a cortisone injection 2 days ago, but didn't provide much relief.    PERTINENT HISTORY:  Patient is a very nice 67 yo div WF  with   HTN, HLD, Prediabetes  and Vitamin D Deficiency and had  hx/o recent Covid PNA~ May 2023  and  and in July 2023 was discovered to have a Rt sided PE and was hospitalized for Heparin & transitioned to Eliquis ( to continue 6 months  thru Jan 7 ) .    She was seen 5-6 weeks ago for evaluation for c/o  "shaking & forgetfulness " .  She reported poor short  & long term memory recall.  (Pt discontinued meds as per MD and reports back to baseline with memory)  PAIN:  Are you having pain? Yes: NPRS scale: 0/10 sitting and 4/10 with ambulation current, 8/10 worst, 0/10  worst   Pain location:  bilat sides of lumbar    PRECAUTIONS: Back and Fall  WEIGHT BEARING RESTRICTIONS: No  FALLS:  Has patient fallen in last 6 months? No  LIVING ENVIRONMENT: Lives with: lives alone Lives in: House/apartment Stairs: Yes: External: 3 steps; none Has following equipment at home: Single point cane  OCCUPATION: retired  PLOF: Independent  PATIENT GOALS: less pain, be able to stand and walk longer, get strength back  NEXT MD VISIT:  OBJECTIVE:    DIAGNOSTIC FINDINGS:  Pt reports recent MRI at emerg [facility] but no results in Epic  4/26 MRI result   CLINICAL DATA: foot pain, soft tissue mass of left third toe US IMPRESSION: Focal tenosynovitis of an extensor tendon in the dorsal foot, with adjacent soft tissue swelling.    MEASUREMENTS:         TODAY'S TREATMENT:   Therapeutic Exercise: Reviewed pain level, response to prior Rx, current function.   LTR x15 PPT 5" x20  Standing PNF chop x10ea with TrA   Child's pose stretch 20sec x3  Counter "L" stretch- 10sec x4 Attempted rows with TrA though stopped due to hand pain    Manual Therapy: Pt received static and gentle dynamic cupping to bilat lumbar paraspinals in prone to improve soft tissue tightness and mobility and reduce pain   PATIENT EDUCATION:  Education details: objective findings, rationale of interventions, exercise form, HEP and POC Person educated: Patient Education method: Explanation and Demonstration Education comprehension: verbalized understanding  HOME EXERCISE PROGRAM:  Land HEP: (prefers printout) Access Code: [email or web address] URL: [email or web address] Date: 02/14/2023 Prepared by: Riki Altes  Exercises - Supine Lower Trunk Rotation  - 1-2 x daily - 7 x weekly - 2 sets - 10 reps - 3 seconds hold - Supine Pelvic Tilt  - 1-2 x daily - 7 x weekly - 2 sets - 10 reps - 5seconds hold - Supine Double Knee to Chest Modified  - 1-2 x daily - 7 x weekly - 2-3  sets - 20seconds hold    Access Code: [email or web address] (prefers printout) URL: [email or web address] Date: 01/07/2023 Prepared by: [facility] - Outpatient Rehab - [facility]  Exercises - Walking March  -  1 x daily - 7 x weekly - Side Stepping with Hand Floats  - 1 x daily - 7 x weekly - Seated Straddle on Flotation Forward Breast Stroke Arms and Bicycle Legs  - 1 x daily - 7 x weekly - 3 sets - 10 reps - Shoulder Extension with Resistance  - 1 x daily - 7 x weekly - 1-2 sets - 10 reps - Split Stance Shoulder Row with Resistance  - 1 x daily - 7 x weekly - 1-2 sets - 10 reps - Standing Hip Abduction  - 1 x daily - 7 x weekly - 1-2 sets - 10 reps - Standing Hamstring Stretch on Chair  - 2 x daily - 7 x weekly - 2 reps - 20 seconds  hold - Standing 'L' Stretch at [employer]  - 1 x daily - 7 x weekly - 2 reps   - Supine March  - 1-2 x daily - 7 x weekly - 2 sets - 10 reps - Supine Transversus Abdominis Bracing - Hands on Stomach  - 2 x daily - 7 x weekly - 2 sets - 10 reps - Supine Core Control with Heel Slide  - 1 x daily - 7 x weekly - 2 sets - 10 reps  ASSESSMENT:  CLINICAL IMPRESSION: Pt is making great progress with pain and sx's.  She has responded very well to cupping and reports improved pain, mobility, and tightness.  Pt states today was the first day she could walk across the parking lot to where her mother gets her hair cut without any pain.  She presents to Rx without any pain and states she feels much better after the cupping.  She reports improved tightness and mobility after cupping.  PT focused on core strengthening and lumbar mobility.  She is having L hand pain and was unable to hold the handle on T band with rows.  She responded well to Rx reporting no pain after Rx.     OBJECTIVE IMPAIRMENTS: Abnormal gait, decreased activity tolerance, decreased balance, decreased endurance, decreased knowledge of use of DME, decreased mobility, difficulty walking, decreased ROM,  decreased strength, obesity, and pain.   ACTIVITY LIMITATIONS: carrying, lifting, bending, sitting, standing, squatting, sleeping, stairs, transfers, bed mobility, and locomotion level  PARTICIPATION LIMITATIONS: cleaning, laundry, shopping, community activity, and yard work  PERSONAL FACTORS: Fitness, Past/current experiences, and 1-2 comorbidities: HTN, ankle dysfunction  are also affecting patient's functional outcome.   REHAB POTENTIAL: Good  CLINICAL DECISION MAKING: Evolving/moderate complexity  EVALUATION COMPLEXITY: Moderate   GOALS: Goals reviewed with patient? Yes  SHORT TERM GOALS: Target date: 01/17/23  Pt will tolerate full aquatic sessions consistently without increase in pain and with improving function to demonstrate good toleration and effectiveness of intervention.   Baseline: Goal status: Met 01/17/23  2.  Pt will improve on 30S sit to stand test to >/= 7 (1/2 of norm value) to demonstrate improving mobility and strength Baseline: 2 Goal status: MET  3.  Pt will improve on lumbar ROM by 50% in all areas with decreasing pain Baseline: see chart Goal status: ongoing 02/14/23  4.  Pt will report increased toleration to standing up towards 10 minutes to fix a meal. Baseline: <4 min  Goal status: ongoing 02/14/23 (max 10 mins) Met 02/18/23  5.  Pt will report an overall decrease in max/worst pain to <6/10 to improve sleep pattern and decrease physiological responses to pain Baseline: 8/10 Goal status: ongoing 8/10   03/18/23  LONG TERM GOALS: Target date: 02/21/23  Pt to meet stated Foto Goal of 50 Baseline: 37% primary Goal status: Met 02/18/23  2.  Pt will be indep with final HEP's (land and aquatic as appropriate) for continued management of condition  Baseline:  Goal status: IN PROGRESS (received aquatic HEP) Target date:  04/15/23  3.  Pt will improve on Berg balance test to >/= 40/56 to demonstrate a decrease in fall risk.   Baseline: 26/56 Goal  status: MET 02/14/23  4.  Pt will improve on Tug test to </=  12s to demonstrate improvement in lower extremity function, mobility and decreased fall risk.  Baseline: 20.49 Goal status: MET 4/12  5.  Pt will improve on strength of bilat hips at least 1 full grade  Baseline: see chart Goal status: IN PROGRESS  Target date:  04/15/23  6.  Pt will be able to walk through grocery store unlimited by fatigue or pain pushing cart to demonstrate improvement in functional mobility. Baseline: >5 mins Goal status: IN PROGRESS Needs to hold onto cart. (03/18/23) Target date:  04/15/23  PLAN:  PT FREQUENCY: 1-2x/week  PT DURATION: 2 - 4 weeks  PLANNED INTERVENTIONS: Therapeutic exercises, Therapeutic activity, Neuromuscular re-education, Balance training, Gait training, Patient/Family education, Self Care, Joint mobilization, Joint manipulation, Stair training, Orthotic/Fit training, DME instructions, Aquatic Therapy, Dry Needling, Electrical stimulation, Spinal manipulation, Spinal mobilization, Cryotherapy, Moist heat, scar mobilization, Splintting, Taping, Traction, Ultrasound, Ionotophoresis 4mg /ml Dexamethasone, Manual therapy, and Re-evaluation.  PLAN FOR NEXT SESSION: continued land based intervention for core strengthening, flexibility, and manual therapy.  Cont with cupping if pt continues to respond well.   Audie Clear III PT, DPT 03/28/23 3:31 PM

## 2023-03-28 ENCOUNTER — Encounter (HOSPITAL_BASED_OUTPATIENT_CLINIC_OR_DEPARTMENT_OTHER): Payer: Self-pay | Admitting: Physical Therapy

## 2023-04-01 ENCOUNTER — Ambulatory Visit (HOSPITAL_BASED_OUTPATIENT_CLINIC_OR_DEPARTMENT_OTHER): Payer: Medicare HMO | Admitting: Physical Therapy

## 2023-04-01 DIAGNOSIS — M6281 Muscle weakness (generalized): Secondary | ICD-10-CM | POA: Diagnosis not present

## 2023-04-01 DIAGNOSIS — M5459 Other low back pain: Secondary | ICD-10-CM

## 2023-04-01 DIAGNOSIS — R2689 Other abnormalities of gait and mobility: Secondary | ICD-10-CM

## 2023-04-01 NOTE — Therapy (Signed)
OUTPATIENT PHYSICAL THERAPY THORACOLUMBAR  TREATMENT    Patient Name: Haley White MRN: [id number] DOB:26-Nov-1955, 67 y.o., female Today's Date: 04/02/2023  END OF SESSION:  PT End of Session - 04/01/23 1652     Visit Number 23    Number of Visits 25    Date for PT Re-Evaluation 04/15/23    Authorization Type aetna medicare    PT Start Time 1626    PT Stop Time 1705    PT Time Calculation (min) 39 min    Activity Tolerance Patient tolerated treatment well    Behavior During Therapy WFL for tasks assessed/performed                    Past Medical History:  Diagnosis Date   Anxiety    Arthritis    Asthma    Cataract    Gout    Hyperlipidemia    Hypertension    Obese    Past Surgical History:  Procedure Laterality Date   APPENDECTOMY  1984   CATARACT EXTRACTION W/ INTRAOCULAR LENS  IMPLANT, BILATERAL  2014   CESAREAN SECTION  1984, 1987, 1994   X3   COLONOSCOPY  11/16/2013   w/Brodie    FOOT SURGERY Left 2002   KNEE SURGERY Left 1998   ROTATOR CUFF REPAIR Right 2007   TUBAL LIGATION  1994   Patient Active Problem List   Diagnosis Date Noted   Snoring 03/24/2023   Sleep-related hypoxia 03/24/2023   Excessive daytime sleepiness 03/24/2023   At risk for obstructive sleep apnea 03/24/2023   DOE (dyspnea on exertion) 05/10/2022   Acute pulmonary embolism (HCC) 05/10/2022   Allergic rhinitis 03/15/2021   Gastro-esophageal reflux disease without esophagitis 03/15/2021   Mild persistent asthma, uncomplicated 03/15/2021   Aortic atherosclerosis (HCC) by Chest CT scan on 02/02/2018  01/10/2021   Asthma    Low back pain 03/17/2019   Lumbosacral spondylosis without myelopathy 03/17/2019   Cubital tunnel syndrome 10/14/2018   Closed fracture of distal end of right radius 07/09/2018   Pseudogout 12/09/2017   Major depressive disorder, recurrent, severe without psychotic features (HCC)    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   Suicide attempt by drug  ingestion (HCC)    Medication management 08/22/2014   Vitamin D deficiency 08/22/2014   Hyperlipidemia, mixed 08/22/2014   Hypertension    Anxiety    Abnormal glucose    Class 2 severe obesity due to excess calories with serious comorbidity and body mass index (BMI) of 37.0 to 37.9 in adult [facility])     PCP: Lucky Cowboy, MD   REFERRING PROVIDER: Jene Every, MD   REFERRING DIAG: M54.51 (ICD-10-CM) - Vertebrogenic low back pain   Rationale for Evaluation and Treatment: Rehabilitation  THERAPY DIAG:  Other low back pain  Other abnormalities of gait and mobility  Muscle weakness (generalized)  ONSET DATE: >80yr  SUBJECTIVE:  SUBJECTIVE STATEMENT: Pt states she feels "wonderful".  "I'm noticing I can do a lot of things I couldn't do earlier."  Pt reports she is able to stand longer and walk longer.  Pt denies any adverse effects after prior Rx.  Pt reports improved sx's after the cupping.  Pt states she did some aquatic exercises and swimming this AM.  She could feel her back afterwards like a stretch.    PERTINENT HISTORY:  Patient is a very nice 67 yo div WF  with   HTN, HLD, Prediabetes  and Vitamin D Deficiency and had  hx/o recent Covid PNA~ May 2023  and  and in July 2023 was discovered to have a Rt sided PE and was hospitalized for Heparin & transitioned to Eliquis ( to continue 6 months  thru Jan 7 ) .    She was seen 5-6 weeks ago for evaluation for c/o  "shaking & forgetfulness " .  She reported poor short  & long term memory recall.  (Pt discontinued meds as per MD and reports back to baseline with memory)  PAIN:  Are you having pain? Yes: NPRS scale: 0-10 current, 8/10 worst, 0/10 worst   Pain location:  bilat sides of lumbar    PRECAUTIONS: Back and Fall  WEIGHT BEARING  RESTRICTIONS: No  FALLS:  Has patient fallen in last 6 months? No  LIVING ENVIRONMENT: Lives with: lives alone Lives in: House/apartment Stairs: Yes: External: 3 steps; none Has following equipment at home: Single point cane  OCCUPATION: retired  PLOF: Independent  PATIENT GOALS: less pain, be able to stand and walk longer, get strength back  NEXT MD VISIT:  OBJECTIVE:    DIAGNOSTIC FINDINGS:  Pt reports recent MRI at emerg [facility] but no results in Epic  4/26 MRI result   CLINICAL DATA: foot pain, soft tissue mass of left third toe US IMPRESSION: Focal tenosynovitis of an extensor tendon in the dorsal foot, with adjacent soft tissue swelling.    MEASUREMENTS:         TODAY'S TREATMENT:   Therapeutic Exercise: Reviewed pain level, response to prior Rx, current function.   LTR 2x15 PPT 5" x20 Supine alt UE/LE with TrA 2x10 Supine shoulder flex/ext with TrA with 2.2# ball 2x10   Standing PNF chop x10ea and x5 with TrA   Child's pose stretch 20sec x3  Seated HS stretch 2x20-30 sec bilat     Manual Therapy: Pt received static and gentle dynamic cupping to bilat lumbar paraspinals in prone to improve soft tissue tightness and mobility and reduce pain   PATIENT EDUCATION:  Education details: objective findings, rationale of interventions, exercise form, HEP and POC Person educated: Patient Education method: Explanation and Demonstration Education comprehension: verbalized understanding  HOME EXERCISE PROGRAM:  Land HEP: (prefers printout) Access Code: [email or web address] URL: [email or web address] Date: 02/14/2023 Prepared by: Riki Altes  Exercises - Supine Lower Trunk Rotation  - 1-2 x daily - 7 x weekly - 2 sets - 10 reps - 3 seconds hold - Supine Pelvic Tilt  - 1-2 x daily - 7 x weekly - 2 sets - 10 reps - 5seconds hold - Supine Double Knee to Chest Modified  - 1-2 x daily - 7 x weekly - 2-3 sets - 20seconds hold    Access Code: [email or web address]  (prefers printout) URL: [email or web address] Date: 01/07/2023 Prepared by: [facility] - Outpatient Rehab - [address]  Exercises - Walking March  - 1 x daily - 7 x weekly - Side Stepping with  Hand Floats  - 1 x daily - 7 x weekly - Seated Straddle on Flotation Forward Breast Stroke Arms and Bicycle Legs  - 1 x daily - 7 x weekly - 3 sets - 10 reps - Shoulder Extension with Resistance  - 1 x daily - 7 x weekly - 1-2 sets - 10 reps - Split Stance Shoulder Row with Resistance  - 1 x daily - 7 x weekly - 1-2 sets - 10 reps - Standing Hip Abduction  - 1 x daily - 7 x weekly - 1-2 sets - 10 reps - Standing Hamstring Stretch on Chair  - 2 x daily - 7 x weekly - 2 reps - 20 seconds  hold - Standing 'L' Stretch at [employer]  - 1 x daily - 7 x weekly - 2 reps   - Supine March  - 1-2 x daily - 7 x weekly - 2 sets - 10 reps - Supine Transversus Abdominis Bracing - Hands on Stomach  - 2 x daily - 7 x weekly - 2 sets - 10 reps - Supine Core Control with Heel Slide  - 1 x daily - 7 x weekly - 2 sets - 10 reps  ASSESSMENT:  CLINICAL IMPRESSION: Pt is making great progress with pain and sx's.  She has responded very well to cupping and reports improved pain, mobility, and tightness.  Pt reports she is able to stand longer and walk longer.  PT focused on core strengthening, flexibility, and lumbar mobility.  PT did limit certain exercises in order to avoid L hand pain.  Pt performed exercises well without c/o's.  She responded well to Rx stating she felt good after Rx though stated she could tell that area had been worked and had increased pain to 2-3/10 after Rx.     OBJECTIVE IMPAIRMENTS: Abnormal gait, decreased activity tolerance, decreased balance, decreased endurance, decreased knowledge of use of DME, decreased mobility, difficulty walking, decreased ROM, decreased strength, obesity, and pain.   ACTIVITY LIMITATIONS: carrying, lifting, bending, sitting, standing, squatting, sleeping,  stairs, transfers, bed mobility, and locomotion level  PARTICIPATION LIMITATIONS: cleaning, laundry, shopping, community activity, and yard work  PERSONAL FACTORS: Fitness, Past/current experiences, and 1-2 comorbidities: HTN, ankle dysfunction  are also affecting patient's functional outcome.   REHAB POTENTIAL: Good  CLINICAL DECISION MAKING: Evolving/moderate complexity  EVALUATION COMPLEXITY: Moderate   GOALS: Goals reviewed with patient? Yes  SHORT TERM GOALS: Target date: 01/17/23  Pt will tolerate full aquatic sessions consistently without increase in pain and with improving function to demonstrate good toleration and effectiveness of intervention.   Baseline: Goal status: Met 01/17/23  2.  Pt will improve on 30S sit to stand test to >/= 7 (1/2 of norm value) to demonstrate improving mobility and strength Baseline: 2 Goal status: MET  3.  Pt will improve on lumbar ROM by 50% in all areas with decreasing pain Baseline: see chart Goal status: ongoing 02/14/23  4.  Pt will report increased toleration to standing up towards 10 minutes to fix a meal. Baseline: <4 min  Goal status: ongoing 02/14/23 (max 10 mins) Met 02/18/23  5.  Pt will report an overall decrease in max/worst pain to <6/10 to improve sleep pattern and decrease physiological responses to pain Baseline: 8/10 Goal status: ongoing 8/10   03/18/23     LONG TERM GOALS: Target date: 02/21/23  Pt to meet stated Foto Goal of 50 Baseline: 37% primary Goal status: Met 02/18/23  2.  Pt will be indep  with final HEP's (land and aquatic as appropriate) for continued management of condition  Baseline:  Goal status: IN PROGRESS (received aquatic HEP) Target date:  04/15/23  3.  Pt will improve on Berg balance test to >/= 40/56 to demonstrate a decrease in fall risk.   Baseline: 26/56 Goal status: MET 02/14/23  4.  Pt will improve on Tug test to </=  12s to demonstrate improvement in lower extremity function, mobility  and decreased fall risk.  Baseline: 20.49 Goal status: MET 4/12  5.  Pt will improve on strength of bilat hips at least 1 full grade  Baseline: see chart Goal status: IN PROGRESS  Target date:  04/15/23  6.  Pt will be able to walk through grocery store unlimited by fatigue or pain pushing cart to demonstrate improvement in functional mobility. Baseline: >5 mins Goal status: IN PROGRESS Needs to hold onto cart. (03/18/23) Target date:  04/15/23  PLAN:  PT FREQUENCY: 1-2x/week  PT DURATION: 2 - 4 weeks  PLANNED INTERVENTIONS: Therapeutic exercises, Therapeutic activity, Neuromuscular re-education, Balance training, Gait training, Patient/Family education, Self Care, Joint mobilization, Joint manipulation, Stair training, Orthotic/Fit training, DME instructions, Aquatic Therapy, Dry Needling, Electrical stimulation, Spinal manipulation, Spinal mobilization, Cryotherapy, Moist heat, scar mobilization, Splintting, Taping, Traction, Ultrasound, Ionotophoresis 4mg /ml Dexamethasone, Manual therapy, and Re-evaluation.  PLAN FOR NEXT SESSION: continued land based intervention for core strengthening, flexibility, and manual therapy.  Cont with cupping.  Possible discharge in 2 visits.    Audie Clear III PT, DPT 04/02/23 7:40 AM

## 2023-04-02 ENCOUNTER — Encounter (HOSPITAL_BASED_OUTPATIENT_CLINIC_OR_DEPARTMENT_OTHER): Payer: Self-pay | Admitting: Physical Therapy

## 2023-04-04 DIAGNOSIS — J3081 Allergic rhinitis due to animal (cat) (dog) hair and dander: Secondary | ICD-10-CM | POA: Diagnosis not present

## 2023-04-04 DIAGNOSIS — J3089 Other allergic rhinitis: Secondary | ICD-10-CM | POA: Diagnosis not present

## 2023-04-04 DIAGNOSIS — J301 Allergic rhinitis due to pollen: Secondary | ICD-10-CM | POA: Diagnosis not present

## 2023-04-06 DIAGNOSIS — Z62820 Parent-biological child conflict: Secondary | ICD-10-CM | POA: Diagnosis not present

## 2023-04-06 DIAGNOSIS — F411 Generalized anxiety disorder: Secondary | ICD-10-CM | POA: Diagnosis not present

## 2023-04-06 DIAGNOSIS — F331 Major depressive disorder, recurrent, moderate: Secondary | ICD-10-CM | POA: Diagnosis not present

## 2023-04-07 ENCOUNTER — Encounter (HOSPITAL_BASED_OUTPATIENT_CLINIC_OR_DEPARTMENT_OTHER): Payer: Self-pay

## 2023-04-07 ENCOUNTER — Ambulatory Visit (HOSPITAL_BASED_OUTPATIENT_CLINIC_OR_DEPARTMENT_OTHER): Payer: Medicare HMO | Attending: Specialist

## 2023-04-07 DIAGNOSIS — R2689 Other abnormalities of gait and mobility: Secondary | ICD-10-CM | POA: Diagnosis not present

## 2023-04-07 DIAGNOSIS — M5459 Other low back pain: Secondary | ICD-10-CM | POA: Diagnosis not present

## 2023-04-07 DIAGNOSIS — M6281 Muscle weakness (generalized): Secondary | ICD-10-CM | POA: Diagnosis not present

## 2023-04-07 NOTE — Therapy (Signed)
OUTPATIENT PHYSICAL THERAPY THORACOLUMBAR  TREATMENT    Patient Name: Haley White MRN: [id number] DOB:09-Jul-1956, 67 y.o., female Today's Date: 04/07/2023  END OF SESSION:  PT End of Session - 04/07/23 1021     Visit Number 24    Number of Visits 25    Date for PT Re-Evaluation 04/15/23    Authorization Type aetna medicare    Progress Note Due on Visit 30    PT Start Time 0933    PT Stop Time 1016    PT Time Calculation (min) 43 min    Activity Tolerance Patient tolerated treatment well    Behavior During Therapy WFL for tasks assessed/performed                     Past Medical History:  Diagnosis Date   Anxiety    Arthritis    Asthma    Cataract    Gout    Hyperlipidemia    Hypertension    Obese    Past Surgical History:  Procedure Laterality Date   APPENDECTOMY  1984   CATARACT EXTRACTION W/ INTRAOCULAR LENS  IMPLANT, BILATERAL  2014   CESAREAN SECTION  1984, 1987, 1994   X3   COLONOSCOPY  11/16/2013   w/Brodie    FOOT SURGERY Left 2002   KNEE SURGERY Left 1998   ROTATOR CUFF REPAIR Right 2007   TUBAL LIGATION  1994   Patient Active Problem List   Diagnosis Date Noted   Snoring 03/24/2023   Sleep-related hypoxia 03/24/2023   Excessive daytime sleepiness 03/24/2023   At risk for obstructive sleep apnea 03/24/2023   DOE (dyspnea on exertion) 05/10/2022   Acute pulmonary embolism (HCC) 05/10/2022   Allergic rhinitis 03/15/2021   Gastro-esophageal reflux disease without esophagitis 03/15/2021   Mild persistent asthma, uncomplicated 03/15/2021   Aortic atherosclerosis (HCC) by Chest CT scan on 02/02/2018  01/10/2021   Asthma    Low back pain 03/17/2019   Lumbosacral spondylosis without myelopathy 03/17/2019   Cubital tunnel syndrome 10/14/2018   Closed fracture of distal end of right radius 07/09/2018   Pseudogout 12/09/2017   Major depressive disorder, recurrent, severe without psychotic features (HCC)    Steroid-induced depression  05/07/2015   Suicide attempt by drug ingestion (HCC)    Medication management 08/22/2014   Vitamin D deficiency 08/22/2014   Hyperlipidemia, mixed 08/22/2014   Hypertension    Anxiety    Abnormal glucose    Class 2 severe obesity due to excess calories with serious comorbidity and body mass index (BMI) of 37.0 to 37.9 in adult [facility])     PCP: Lucky Cowboy, MD   REFERRING PROVIDER: Jene Every, MD   REFERRING DIAG: M54.51 (ICD-10-CM) - Vertebrogenic low back pain   Rationale for Evaluation and Treatment: Rehabilitation  THERAPY DIAG:  Other low back pain  Other abnormalities of gait and mobility  Muscle weakness (generalized)  ONSET DATE: >3yr  SUBJECTIVE:  SUBJECTIVE STATEMENT: Pt reports she is now able to walk 20-30 minutes with pain, a significant improvement from previously. "I think the cupping is really helping!" 0/10 pain level at entry. Sleeping better as well. Pt has been getting in her pool about 2x/week for swimming/exercises.   PERTINENT HISTORY:  Patient is a very nice 67 yo div WF  with   HTN, HLD, Prediabetes  and Vitamin D Deficiency and had  hx/o recent Covid PNA~ May 2023  and  and in July 2023 was discovered to have a Rt sided PE and was hospitalized for Heparin & transitioned to Eliquis ( to continue 6 months  thru Jan 7 ) .    She was seen 5-6 weeks ago for evaluation for c/o  "shaking & forgetfulness " .  She reported poor short  & long term memory recall.  (Pt discontinued meds as per MD and reports back to baseline with memory)  PAIN:  Are you having pain? No  Pain location:  bilat sides of lumbar    PRECAUTIONS: Back and Fall  WEIGHT BEARING RESTRICTIONS: No  FALLS:  Has patient fallen in last 6 months? No  LIVING ENVIRONMENT: Lives with: lives  alone Lives in: House/apartment Stairs: Yes: External: 3 steps; none Has following equipment at home: Single point cane  OCCUPATION: retired  PLOF: Independent  PATIENT GOALS: less pain, be able to stand and walk longer, get strength back  NEXT MD VISIT:  OBJECTIVE:    DIAGNOSTIC FINDINGS:  Pt reports recent MRI at emerg ortho but no results in Epic  4/26 MRI result   CLINICAL DATA: foot pain, soft tissue mass of left third toe US IMPRESSION: Focal tenosynovitis of an extensor tendon in the dorsal foot, with adjacent soft tissue swelling.    MEASUREMENTS:         TODAY'S TREATMENT:   Therapeutic Exercise: Reviewed pain level, response to prior Rx, current function.   Seated lumbar stretch with ball roll at table 5" x10 LTR 2x15 PPT 5" x20 DKTC 5"  2x10 with physioball under knees Prone hip extension with knee flexed x10ea Supine shoulder flex/ext with TrA with 3lb dumbbell- 2x10 Reverse crunch x5 Sidelying hip abduction 2x10ea   Child's pose stretch 20sec x3 with 2 cups to low back      Manual Therapy: Pt received static cupping to bilat lumbar paraspinals in prone to improve soft tissue tightness and mobility and reduce pain  PATIENT EDUCATION:  Education details: objective findings, rationale of interventions, exercise form, HEP and POC Person educated: Patient Education method: Explanation and Demonstration Education comprehension: verbalized understanding  HOME EXERCISE PROGRAM:  Land HEP: (prefers printout) Access Code: [email or web address] URL: [email or web address] Date: 02/14/2023 Prepared by: Riki Altes  Exercises - Supine Lower Trunk Rotation  - 1-2 x daily - 7 x weekly - 2 sets - 10 reps - 3 seconds hold - Supine Pelvic Tilt  - 1-2 x daily - 7 x weekly - 2 sets - 10 reps - 5seconds hold - Supine Double Knee to Chest Modified  - 1-2 x daily - 7 x weekly - 2-3 sets - 20seconds hold    Access Code: [email or web address] (prefers printout) URL:  [email or web address] Date: 01/07/2023 Prepared by: [facility] - Outpatient Rehab - [address]  Exercises - Walking March  - 1 x daily - 7 x weekly - Side Stepping with Hand Floats  - 1 x daily - 7 x weekly - Seated Straddle on Flotation Forward Breast Stroke Arms and Bicycle Legs  -  1 x daily - 7 x weekly - 3 sets - 10 reps - Shoulder Extension with Resistance  - 1 x daily - 7 x weekly - 1-2 sets - 10 reps - Split Stance Shoulder Row with Resistance  - 1 x daily - 7 x weekly - 1-2 sets - 10 reps - Standing Hip Abduction  - 1 x daily - 7 x weekly - 1-2 sets - 10 reps - Standing Hamstring Stretch on Chair  - 2 x daily - 7 x weekly - 2 reps - 20 seconds  hold - Standing 'L' Stretch at [employer]  - 1 x daily - 7 x weekly - 2 reps   - Supine March  - 1-2 x daily - 7 x weekly - 2 sets - 10 reps - Supine Transversus Abdominis Bracing - Hands on Stomach  - 2 x daily - 7 x weekly - 2 sets - 10 reps - Supine Core Control with Heel Slide  - 1 x daily - 7 x weekly - 2 sets - 10 reps  ASSESSMENT:  CLINICAL IMPRESSION: Pt continues to respond well to cupping. Added Child's pose stretch with cupping today which she reported benefit from. Trialed reverse crunch today to challenge core strengthening, which she did feel in low back, but did not describe this as painful. Good tolerance for sidelying hip abduction. Will monitor pain level and progress as tolerated. Pt would like to eventually have 1 on 1 session with fitness specialist to go over a workout program on gym equipment. Re-eval next visit.     OBJECTIVE IMPAIRMENTS: Abnormal gait, decreased activity tolerance, decreased balance, decreased endurance, decreased knowledge of use of DME, decreased mobility, difficulty walking, decreased ROM, decreased strength, obesity, and pain.   ACTIVITY LIMITATIONS: carrying, lifting, bending, sitting, standing, squatting, sleeping, stairs, transfers, bed mobility, and locomotion  level  PARTICIPATION LIMITATIONS: cleaning, laundry, shopping, community activity, and yard work  PERSONAL FACTORS: Fitness, Past/current experiences, and 1-2 comorbidities: HTN, ankle dysfunction  are also affecting patient's functional outcome.   REHAB POTENTIAL: Good  CLINICAL DECISION MAKING: Evolving/moderate complexity  EVALUATION COMPLEXITY: Moderate   GOALS: Goals reviewed with patient? Yes  SHORT TERM GOALS: Target date: 01/17/23  Pt will tolerate full aquatic sessions consistently without increase in pain and with improving function to demonstrate good toleration and effectiveness of intervention.   Baseline: Goal status: Met 01/17/23  2.  Pt will improve on 30S sit to stand test to >/= 7 (1/2 of norm value) to demonstrate improving mobility and strength Baseline: 2 Goal status: MET  3.  Pt will improve on lumbar ROM by 50% in all areas with decreasing pain Baseline: see chart Goal status: ongoing 02/14/23  4.  Pt will report increased toleration to standing up towards 10 minutes to fix a meal. Baseline: <4 min  Goal status: ongoing 02/14/23 (max 10 mins) Met 02/18/23  5.  Pt will report an overall decrease in max/worst pain to <6/10 to improve sleep pattern and decrease physiological responses to pain Baseline: 8/10 Goal status: ongoing 8/10   03/18/23     LONG TERM GOALS: Target date: 02/21/23  Pt to meet stated Foto Goal of 50 Baseline: 37% primary Goal status: Met 02/18/23  2.  Pt will be indep with final HEP's (land and aquatic as appropriate) for continued management of condition  Baseline:  Goal status: IN PROGRESS (received aquatic HEP) Target date:  04/15/23  3.  Pt will improve on Berg balance test to >/= 40/56 to  demonstrate a decrease in fall risk.   Baseline: 26/56 Goal status: MET 02/14/23  4.  Pt will improve on Tug test to </=  12s to demonstrate improvement in lower extremity function, mobility and decreased fall risk.  Baseline: 20.49 Goal  status: MET 4/12  5.  Pt will improve on strength of bilat hips at least 1 full grade  Baseline: see chart Goal status: IN PROGRESS  Target date:  04/15/23  6.  Pt will be able to walk through grocery store unlimited by fatigue or pain pushing cart to demonstrate improvement in functional mobility. Baseline: >5 mins Goal status: IN PROGRESS Needs to hold onto cart. (03/18/23) Target date:  04/15/23  PLAN:  PT FREQUENCY: 1-2x/week  PT DURATION: 2 - 4 weeks  PLANNED INTERVENTIONS: Therapeutic exercises, Therapeutic activity, Neuromuscular re-education, Balance training, Gait training, Patient/Family education, Self Care, Joint mobilization, Joint manipulation, Stair training, Orthotic/Fit training, DME instructions, Aquatic Therapy, Dry Needling, Electrical stimulation, Spinal manipulation, Spinal mobilization, Cryotherapy, Moist heat, scar mobilization, Splintting, Taping, Traction, Ultrasound, Ionotophoresis 4mg /ml Dexamethasone, Manual therapy, and Re-evaluation.  PLAN FOR NEXT SESSION: continued land based intervention for core strengthening, flexibility, and manual therapy.  Cont with cupping.  Possible discharge in 2 visits.    Riki Altes, PTA  04/07/23 10:45 AM

## 2023-04-08 DIAGNOSIS — M79645 Pain in left finger(s): Secondary | ICD-10-CM | POA: Diagnosis not present

## 2023-04-08 DIAGNOSIS — M79642 Pain in left hand: Secondary | ICD-10-CM | POA: Diagnosis not present

## 2023-04-09 DIAGNOSIS — J301 Allergic rhinitis due to pollen: Secondary | ICD-10-CM | POA: Diagnosis not present

## 2023-04-10 ENCOUNTER — Ambulatory Visit: Payer: Medicare HMO | Admitting: Podiatry

## 2023-04-10 ENCOUNTER — Encounter (HOSPITAL_BASED_OUTPATIENT_CLINIC_OR_DEPARTMENT_OTHER): Payer: Self-pay | Admitting: Physical Therapy

## 2023-04-10 ENCOUNTER — Ambulatory Visit: Payer: Medicare HMO

## 2023-04-10 ENCOUNTER — Ambulatory Visit (HOSPITAL_BASED_OUTPATIENT_CLINIC_OR_DEPARTMENT_OTHER): Payer: Medicare HMO | Admitting: Physical Therapy

## 2023-04-10 ENCOUNTER — Other Ambulatory Visit: Payer: Self-pay | Admitting: Podiatry

## 2023-04-10 DIAGNOSIS — M6281 Muscle weakness (generalized): Secondary | ICD-10-CM | POA: Diagnosis not present

## 2023-04-10 DIAGNOSIS — R2689 Other abnormalities of gait and mobility: Secondary | ICD-10-CM

## 2023-04-10 DIAGNOSIS — M79672 Pain in left foot: Secondary | ICD-10-CM

## 2023-04-10 DIAGNOSIS — M5459 Other low back pain: Secondary | ICD-10-CM | POA: Diagnosis not present

## 2023-04-10 DIAGNOSIS — M7989 Other specified soft tissue disorders: Secondary | ICD-10-CM | POA: Diagnosis not present

## 2023-04-10 NOTE — Patient Instructions (Signed)

## 2023-04-10 NOTE — Therapy (Signed)
OUTPATIENT PHYSICAL THERAPY THORACOLUMBAR  TREATMENT    Patient Name: Haley White MRN: [id number] DOB:October 22, 1956, 67 y.o., female Today's Date: 04/11/2023  END OF SESSION:  PT End of Session - 04/10/23 0856     Visit Number 25    Number of Visits 26    Date for PT Re-Evaluation 04/15/23    Authorization Type aetna medicare    PT Start Time [phone]    PT Stop Time 0935    PT Time Calculation (min) 42 min    Activity Tolerance Patient tolerated treatment well    Behavior During Therapy WFL for tasks assessed/performed                     Past Medical History:  Diagnosis Date   Anxiety    Arthritis    Asthma    Cataract    Gout    Hyperlipidemia    Hypertension    Obese    Past Surgical History:  Procedure Laterality Date   APPENDECTOMY  1984   CATARACT EXTRACTION W/ INTRAOCULAR LENS  IMPLANT, BILATERAL  2014   CESAREAN SECTION  1984, 1987, 1994   X3   COLONOSCOPY  11/16/2013   w/Brodie    FOOT SURGERY Left 2002   KNEE SURGERY Left 1998   ROTATOR CUFF REPAIR Right 2007   TUBAL LIGATION  1994   Patient Active Problem List   Diagnosis Date Noted   Snoring 03/24/2023   Sleep-related hypoxia 03/24/2023   Excessive daytime sleepiness 03/24/2023   At risk for obstructive sleep apnea 03/24/2023   DOE (dyspnea on exertion) 05/10/2022   Acute pulmonary embolism (HCC) 05/10/2022   Allergic rhinitis 03/15/2021   Gastro-esophageal reflux disease without esophagitis 03/15/2021   Mild persistent asthma, uncomplicated 03/15/2021   Aortic atherosclerosis (HCC) by Chest CT scan on 02/02/2018  01/10/2021   Asthma    Low back pain 03/17/2019   Lumbosacral spondylosis without myelopathy 03/17/2019   Cubital tunnel syndrome 10/14/2018   Closed fracture of distal end of right radius 07/09/2018   Pseudogout 12/09/2017   Major depressive disorder, recurrent, severe without psychotic features (HCC)    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   Suicide attempt by drug  ingestion (HCC)    Medication management 08/22/2014   Vitamin D deficiency 08/22/2014   Hyperlipidemia, mixed 08/22/2014   Hypertension    Anxiety    Abnormal glucose    Class 2 severe obesity due to excess calories with serious comorbidity and body mass index (BMI) of 37.0 to 37.9 in adult [facility])     PCP: Lucky Cowboy, MD   REFERRING PROVIDER: Jene Every, MD   REFERRING DIAG: M54.51 (ICD-10-CM) - Vertebrogenic low back pain   Rationale for Evaluation and Treatment: Rehabilitation  THERAPY DIAG:  Other low back pain  Other abnormalities of gait and mobility  Muscle weakness (generalized)  ONSET DATE: >30yr  SUBJECTIVE:  SUBJECTIVE STATEMENT: "I am very pleased and that's an understatement."  Pt states her back is doing good.  Pt denies pain currently.  Pt thinks the cupping has made a huge difference.  Her worst pain is with standing and walking for a long period of time though sits and rests and then feels fine.  Pt has been performing her aquatic exercises 2x/wk.  Pt is compliant with land based HEP.   Pt states she is having issues with her L foot.  She has a cyst on the the top of her foot.  Pt is wearing a brace on her L hand which is helping support the hand.    PERTINENT HISTORY:  Patient is a very nice 67 yo div WF  with   HTN, HLD, Prediabetes  and Vitamin D Deficiency and had  hx/o recent Covid PNA~ May 2023  and  and in July 2023 was discovered to have a Rt sided PE and was hospitalized for Heparin & transitioned to Eliquis ( to continue 6 months  thru Jan 7 ) .    She was seen 5-6 weeks ago for evaluation for c/o  "shaking & forgetfulness " .  She reported poor short  & long term memory recall.  (Pt discontinued meds as per MD and reports back to baseline with memory)  PAIN:   Are you having pain? No  Pain location:  bilat sides of lumbar  0/10 current and best, 1-2/10 worst   PRECAUTIONS: Back and Fall  WEIGHT BEARING RESTRICTIONS: No  FALLS:  Has patient fallen in last 6 months? No  LIVING ENVIRONMENT: Lives with: lives alone Lives in: House/apartment Stairs: Yes: External: 3 steps; none Has following equipment at home: Single point cane  OCCUPATION: retired  PLOF: Independent  PATIENT GOALS: less pain, be able to stand and walk longer, get strength back  NEXT MD VISIT:  OBJECTIVE:    DIAGNOSTIC FINDINGS:  Pt reports recent MRI at emerg [facility] but no results in Epic  4/26 MRI result   CLINICAL DATA: foot pain, soft tissue mass of left third toe US IMPRESSION: Focal tenosynovitis of an extensor tendon in the dorsal foot, with adjacent soft tissue swelling.    MEASUREMENTS:         TODAY'S TREATMENT:    Therapeutic Exercise:   LUMBAR ROM:    AROM eval 01/21/23 4/12 5/14 6/6  Flexion Finger tips to knees To knees To knees To mid shin with pain returning to standing Past mid shin just proximal to ankles without pain  Extension 25% 50% 50%P! 50% with pain 75% without pain  Right lateral flexion 10% 25% 75% 80% 80%  Left lateral flexion 25% 50% 65% 65% with pain 75% without pain  Right rotation 50% 50% 75% WFL WFL  Left rotation 50% 50% 50% 75% WFL    Hip flexion strength:  5/5 bilat   Reviewed pain level, response to prior Rx, current function.    LTR 2x15 DKTC 5"  2x10 with physioball under knees Supine shoulder flex/ext with TrA with 3lb dumbbell- 2x10 Supine alt hip extension arc x5 with TrA Sidelying hip abduction x10 and x 5 bilat   Child's pose stretch 20sec x3   PT spent time educating pt concerning discharge planning and progression to personal training.  PT and pt discussed with personal trainer her progression and expectations.  Pt received information and all of her questions were answered.     PATIENT  EDUCATION:  Education details: objective findings,  rationale of interventions, exercise form, HEP and POC Person educated: Patient Education method: Explanation and Demonstration Education comprehension: verbalized understanding  HOME EXERCISE PROGRAM:  Land HEP: (prefers printout) Access Code: [email or web address] URL: [email or web address] Date: 02/14/2023 Prepared by: Riki Altes  Exercises - Supine Lower Trunk Rotation  - 1-2 x daily - 7 x weekly - 2 sets - 10 reps - 3 seconds hold - Supine Pelvic Tilt  - 1-2 x daily - 7 x weekly - 2 sets - 10 reps - 5seconds hold - Supine Double Knee to Chest Modified  - 1-2 x daily - 7 x weekly - 2-3 sets - 20seconds hold    Access Code: [email or web address] (prefers printout) URL: [email or web address] Date: 01/07/2023 Prepared by: [facility] [address]  Exercises - Walking March  - 1 x daily - 7 x weekly - Side Stepping with Hand Floats  - 1 x daily - 7 x weekly - Seated Straddle on Flotation Forward Breast Stroke Arms and Bicycle Legs  - 1 x daily - 7 x weekly - 3 sets - 10 reps - Shoulder Extension with Resistance  - 1 x daily - 7 x weekly - 1-2 sets - 10 reps - Split Stance Shoulder Row with Resistance  - 1 x daily - 7 x weekly - 1-2 sets - 10 reps - Standing Hip Abduction  - 1 x daily - 7 x weekly - 1-2 sets - 10 reps - Standing Hamstring Stretch on Chair  - 2 x daily - 7 x weekly - 2 reps - 20 seconds  hold - Standing 'L' Stretch at [employer]  - 1 x daily - 7 x weekly - 2 reps   - Supine March  - 1-2 x daily - 7 x weekly - 2 sets - 10 reps - Supine Transversus Abdominis Bracing - Hands on Stomach  - 2 x daily - 7 x weekly - 2 sets - 10 reps - Supine Core Control with Heel Slide  - 1 x daily - 7 x weekly - 2 sets - 10 reps  ASSESSMENT:  CLINICAL IMPRESSION: Pt is making great progress with her lumbar pain and sx's.  Pt has decreased worst pain.  She has been having L hand pain and now L foot pain which  she is seeing MD for those issues.  Pt demonstrates improved AROM t/o lumbar and met the majority of STG #3.  Pt has good strength in hip flexion though appears to have some weakness in glute med as evidenced by fatigue with S/L hip abduction.  Pt states she could feel her back with the supine hip ext arc exercise.  Pt performed exercises well.  Pt has made good progress toward goals.  She has met STG's #1,2,5 and LTG's #1,3,4.  Pt responded well to Rx though did state she could feel her back after Rx.  Pt reports it's not painful, but she can feel her back.  PT will plan to establish independence with HEP and possibly discharge next Rx.  PT and pt spoke with personal trainer and answered all of pt's questions satisfactorily.  Pt plans to transition to personal trainer after PT.    OBJECTIVE IMPAIRMENTS: Abnormal gait, decreased activity tolerance, decreased balance, decreased endurance, decreased knowledge of use of DME, decreased mobility, difficulty walking, decreased ROM, decreased strength, obesity, and pain.   ACTIVITY LIMITATIONS: carrying, lifting, bending, sitting, standing, squatting, sleeping, stairs, transfers, bed mobility, and locomotion level  PARTICIPATION LIMITATIONS: cleaning, laundry, shopping, community activity, and  yard work  PERSONAL FACTORS: Fitness, Past/current experiences, and 1-2 comorbidities: HTN, ankle dysfunction  are also affecting patient's functional outcome.   REHAB POTENTIAL: Good  CLINICAL DECISION MAKING: Evolving/moderate complexity  EVALUATION COMPLEXITY: Moderate   GOALS: Goals reviewed with patient? Yes  SHORT TERM GOALS: Target date: 01/17/23  Pt will tolerate full aquatic sessions consistently without increase in pain and with improving function to demonstrate good toleration and effectiveness of intervention.   Baseline: Goal status: Met 01/17/23  2.  Pt will improve on 30S sit to stand test to >/= 7 (1/2 of norm value) to demonstrate improving  mobility and strength Baseline: 2 Goal status: MET  3.  Pt will improve on lumbar ROM by 50% in all areas with decreasing pain Baseline: see chart Goal status: 90% MET  04/10/23  4.  Pt will report increased toleration to standing up towards 10 minutes to fix a meal. Baseline: <4 min  Goal status: ongoing 02/14/23 (max 10 mins) Met 02/18/23  5.  Pt will report an overall decrease in max/worst pain to <6/10 to improve sleep pattern and decrease physiological responses to pain Baseline: 8/10 Goal status: GOAL MET 04/10/23     LONG TERM GOALS: Target date: 02/21/23  Pt to meet stated Foto Goal of 50 Baseline: 37% primary Goal status: Met 02/18/23  2.  Pt will be indep with final HEP's (land and aquatic as appropriate) for continued management of condition  Baseline:  Goal status: IN PROGRESS (received aquatic HEP) Target date:  04/15/23  3.  Pt will improve on Berg balance test to >/= 40/56 to demonstrate a decrease in fall risk.   Baseline: 26/56 Goal status: MET 02/14/23  4.  Pt will improve on Tug test to </=  12s to demonstrate improvement in lower extremity function, mobility and decreased fall risk.  Baseline: 20.49 Goal status: MET 4/12  5.  Pt will improve on strength of bilat hips at least 1 full grade  Baseline: see chart Goal status: IN PROGRESS  Target date:  04/15/23  6.  Pt will be able to walk through grocery store unlimited by fatigue or pain pushing cart to demonstrate improvement in functional mobility. Baseline: >5 mins Goal status: IN PROGRESS Needs to hold onto cart. (03/18/23) Target date:  04/15/23  PLAN:  PT FREQUENCY: 1-2x/week  PT DURATION: 2 - 4 weeks  PLANNED INTERVENTIONS: Therapeutic exercises, Therapeutic activity, Neuromuscular re-education, Balance training, Gait training, Patient/Family education, Self Care, Joint mobilization, Joint manipulation, Stair training, Orthotic/Fit training, DME instructions, Aquatic Therapy, Dry Needling, Electrical  stimulation, Spinal manipulation, Spinal mobilization, Cryotherapy, Moist heat, scar mobilization, Splintting, Taping, Traction, Ultrasound, Ionotophoresis 4mg /ml Dexamethasone, Manual therapy, and Re-evaluation.  PLAN FOR NEXT SESSION: continued land based intervention for core strengthening, flexibility, and manual therapy.  Cont with cupping.  Finalize HEP.  Possible discharge next visit.    Audie Clear III PT, DPT 04/11/23 11:49 AM

## 2023-04-14 ENCOUNTER — Encounter (HOSPITAL_BASED_OUTPATIENT_CLINIC_OR_DEPARTMENT_OTHER): Payer: Self-pay | Admitting: Emergency Medicine

## 2023-04-14 ENCOUNTER — Ambulatory Visit (HOSPITAL_BASED_OUTPATIENT_CLINIC_OR_DEPARTMENT_OTHER): Payer: Medicare HMO | Admitting: Physical Therapy

## 2023-04-14 ENCOUNTER — Other Ambulatory Visit: Payer: Self-pay

## 2023-04-14 ENCOUNTER — Encounter (HOSPITAL_BASED_OUTPATIENT_CLINIC_OR_DEPARTMENT_OTHER): Payer: Self-pay

## 2023-04-14 ENCOUNTER — Ambulatory Visit: Payer: Medicare HMO | Admitting: Nurse Practitioner

## 2023-04-14 ENCOUNTER — Emergency Department (HOSPITAL_BASED_OUTPATIENT_CLINIC_OR_DEPARTMENT_OTHER)
Admission: EM | Admit: 2023-04-14 | Discharge: 2023-04-14 | Disposition: A | Discharge status: Home / Self Care | Payer: Medicare HMO | Attending: Emergency Medicine | Admitting: Emergency Medicine

## 2023-04-14 DIAGNOSIS — Z79899 Other long term (current) drug therapy: Secondary | ICD-10-CM | POA: Diagnosis not present

## 2023-04-14 DIAGNOSIS — I10 Essential (primary) hypertension: Secondary | ICD-10-CM | POA: Insufficient documentation

## 2023-04-14 DIAGNOSIS — J45909 Unspecified asthma, uncomplicated: Secondary | ICD-10-CM | POA: Diagnosis not present

## 2023-04-14 DIAGNOSIS — T63441A Toxic effect of venom of bees, accidental (unintentional), initial encounter: Secondary | ICD-10-CM | POA: Diagnosis not present

## 2023-04-14 DIAGNOSIS — Z7951 Long term (current) use of inhaled steroids: Secondary | ICD-10-CM | POA: Diagnosis not present

## 2023-04-14 MED ORDER — TRIAMCINOLONE ACETONIDE 0.1 % EX CREA: 1.0000 | TOPICAL_CREAM | Freq: Two times a day (BID) | CUTANEOUS | 0 refills | Status: DC

## 2023-04-14 NOTE — ED Notes (Signed)
Patient verbalizes understanding of discharge instructions. Opportunity for questioning and answers were provided. Patient discharged from ED.  °

## 2023-04-14 NOTE — ED Provider Notes (Signed)
[facility] AT [facility] Provider Note   CSN: [id number] Arrival date & time: 04/14/23  1005     History  Chief Complaint  Patient presents with   Bee Sting    Haley White is a 67 y.o. female.  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  She states she was stung by bee on the right medial aspect of the upper arm 2 days ago.  She does not know what type of bee stung her.  She attempted to extract a stinger afterwards but was unsuccessful.  She reports a stinging and itching sensation to the area since that time.  She states the surrounding redness has progressively gotten worse.  She denies numbness or tingling of the right upper extremity.  She denies oral swelling, difficulty breathing or swallowing, shortness of breath.  She has been using topical and p.o. Benadryl with mild improvement.  HPI     Home Medications Prior to Admission medications   Medication Sig Start Date End Date Taking? Authorizing Provider  triamcinolone cream (KENALOG) 0.1 % Apply 1 Application topically 2 (two) times daily. 04/14/23  Yes Sian Rockers, Edsel Petrin, PA-C  albuterol (VENTOLIN HFA) 108 (90 Base) MCG/ACT inhaler Inhale 2 puffs into the lungs every 4 (four) hours as needed for wheezing or shortness of breath. 07/17/17   Judd Gaudier, NP  buPROPion (WELLBUTRIN XL) 300 MG 24 hr tablet Take 300 mg by mouth every morning. 12/17/20   [provider]  EPINEPHrine 0.3 mg/0.3 mL IJ SOAJ injection See admin instructions. 02/08/20   [provider]  FLUoxetine (PROZAC) 20 MG capsule Take 60 mg by mouth daily. 02/23/23   [provider]  fluticasone (FLONASE) 50 MCG/ACT nasal spray Place 2 sprays into both nostrils daily. 10/17/17   [provider]  levocetirizine (XYZAL) 5 MG tablet Take 5 mg by mouth every evening. 12/07/17    [provider]  lisinopril (ZESTRIL) 20 MG tablet Take  1 tablet  Daily for BP                                                            /                                    TAKE                                         BY                                        MOUTH 11/22/22   Lucky Cowboy, MD  methocarbamol (ROBAXIN) 500 MG tablet Take 1 tablet (500 mg total) by mouth every 8 (eight) hours as needed for muscle spasms. 06/03/22   Lurline Idol, FNP  methylphenidate (RITALIN) 10 MG tablet Take 10 mg by mouth 2 (two) times daily. 01/16/18   [provider]  montelukast (SINGULAIR) 10 MG  tablet Take 10 mg by mouth daily.    [provider]  pantoprazole (PROTONIX) 40 MG tablet Take 1 tablet  Daily to Prevent Indigestion & Heartburn                                      /                        TAKE                       BY                       MOUTH 06/08/22   Lucky Cowboy, MD  rosuvastatin (CRESTOR) 40 MG tablet Take 40 mg by mouth daily. 02/20/23   [provider]  SYMBICORT 160-4.5 MCG/ACT inhaler Inhale 2 puffs into the lungs 2 (two) times daily. 01/16/18   [provider]  VITAMIN D PO Take 10,000 Units by mouth daily.    [provider]      Allergies    Molds & smuts, Biaxin [clarithromycin], Prednisone, Serevent [salmeterol], and Tequin [gatifloxacin]    Review of Systems   Review of Systems  Skin:  Positive for wound.  All other systems reviewed and are negative.   Physical Exam Updated Vital Signs BP (!) 155/82 (BP Location: Left Arm)   Pulse 79   Temp 98 F (36.7 C)   Resp 20   Ht 5\' 1"  (1.549 m)   Wt 102.5 kg   LMP 11/26/2011   SpO2 96%   BMI 42.70 kg/m  Physical Exam Vitals and nursing note reviewed.  Constitutional:      General: She is not in acute distress.    Appearance: Normal appearance. She is normal weight. She is not ill-appearing.  HENT:     Head: Normocephalic and atraumatic.  Pulmonary:      Effort: Pulmonary effort is normal. No respiratory distress.     Breath sounds: Normal breath sounds.  Abdominal:     General: Abdomen is flat.  Musculoskeletal:        General: Normal range of motion.     Cervical back: Neck supple.  Skin:    General: Skin is warm and dry.     Comments: Tiny sting site present to the right medial aspect of the upper arm, just superficial to the medial epicondyle.  Well-demarcated surrounding erythema.  No induration or fluctuance.  No drainage.  Neurological:     Mental Status: She is alert and oriented to person, place, and time.  Psychiatric:        Mood and Affect: Mood normal.        Behavior: Behavior normal.     ED Results / Procedures / Treatments   Labs (all labs ordered are listed, but only abnormal results are displayed) Labs Reviewed - No data to display  EKG None  Radiology No results found.  Procedures Procedures    Medications Ordered in ED Medications - No data to display  ED Course/ Medical Decision Making/ A&P                             Medical Decision Making This patient presents to the ED for concern of bee sting, this involves an extensive number of treatment options,  and is a complaint that carries with it a high risk of complications and morbidity.  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 Family daughter is at bedside and provides a portion of the history  Afebrile, hemodynamically stable.  67 year old female presenting to the ED for evaluation of bee sting to the right upper arm.  There is a sting site present.  There is some surrounding erythema which was marked with a skin marker.  This is likely a local allergic reaction.  No evidence of infection today.   Patient denies any signs or symptoms of anaphylaxis.  She does have an EpiPen at home.  She was sent a prescription for Kenalog and educated on appropriate use.  She states she does have an appointment with her PCP later today but was encouraged to schedule appointment for approximately 3 days from now for reevaluation.  She was given strict return precautions including signs and symptoms of anaphylaxis.  Stable at discharge.  At this time there does not appear to be any evidence of an acute emergency medical condition and the patient appears stable for discharge with appropriate outpatient follow up. Diagnosis was discussed with patient who verbalizes understanding of care plan and is agreeable to discharge. I have discussed return precautions with patient and daughter who verbalizes understanding. Patient encouraged to follow-up with their PCP within 3 days. All questions answered.  Note: Portions of this report may have been transcribed using voice recognition software. Every effort was made to ensure accuracy; however, inadvertent computerized transcription errors may still be present        Final Clinical Impression(s) / ED Diagnoses Final diagnoses:  Bee sting reaction, accidental or unintentional, initial encounter    Rx / DC Orders ED Discharge Orders          Ordered    triamcinolone cream (KENALOG) 0.1 %  2 times daily        04/14/23 1041              Michelle Piper, PA-C 04/14/23 1042    Melene Plan, DO 04/14/23 1408

## 2023-04-14 NOTE — ED Triage Notes (Signed)
Pt arrives to ED with c/o bee sting to right arm that occurred x2 days ago. Pt notes swelling to right arm.

## 2023-04-14 NOTE — Discharge Instructions (Addendum)
You have been seen today for your complaint of bee sting. Your discharge medications include Kenalog.  This is a topical steroid.  Apply twice daily.  Wrap with Saran wrap in order to improve contact time. Follow up with: Your primary care provider in 3 to 4 days for reevaluation of your symptoms Please seek immediate medical care if you develop any of the following symptoms: Chest tightness or pain. Wheezing, or trouble swallowing or breathing. Light-headedness, dizziness, or fainting. Itchy, raised, red patches on the skin beyond the sting site. Abdominal cramping, nausea, vomiting, or diarrhea. Trouble swallowing or a swollen tongue, throat, or lips. At this time there does not appear to be the presence of an emergent medical condition, however there is always the potential for conditions to change. Please read and follow the below instructions.  Do not take your medicine if  develop an itchy rash, swelling in your mouth or lips, or difficulty breathing; call 911 and seek immediate emergency medical attention if this occurs.  You may review your lab tests and imaging results in their entirety on your MyChart account.  Please discuss all results of fully with your primary care provider and other specialist at your follow-up visit.  Note: Portions of this text may have been transcribed using voice recognition software. Every effort was made to ensure accuracy; however, inadvertent computerized transcription errors may still be present.

## 2023-04-16 ENCOUNTER — Ambulatory Visit (INDEPENDENT_AMBULATORY_CARE_PROVIDER_SITE_OTHER): Payer: Medicare HMO | Admitting: Neurology

## 2023-04-16 DIAGNOSIS — I700 Atherosclerosis of aorta: Secondary | ICD-10-CM | POA: Diagnosis not present

## 2023-04-16 DIAGNOSIS — G4734 Idiopathic sleep related nonobstructive alveolar hypoventilation: Secondary | ICD-10-CM | POA: Diagnosis not present

## 2023-04-16 DIAGNOSIS — G4733 Obstructive sleep apnea (adult) (pediatric): Secondary | ICD-10-CM

## 2023-04-16 DIAGNOSIS — R0683 Snoring: Secondary | ICD-10-CM

## 2023-04-16 DIAGNOSIS — E661 Drug-induced obesity: Secondary | ICD-10-CM

## 2023-04-16 DIAGNOSIS — G4719 Other hypersomnia: Secondary | ICD-10-CM

## 2023-04-16 DIAGNOSIS — Z6841 Body Mass Index (BMI) 40.0 and over, adult: Secondary | ICD-10-CM

## 2023-04-16 DIAGNOSIS — Z9189 Other specified personal risk factors, not elsewhere classified: Secondary | ICD-10-CM

## 2023-04-17 ENCOUNTER — Ambulatory Visit (HOSPITAL_BASED_OUTPATIENT_CLINIC_OR_DEPARTMENT_OTHER): Payer: Medicare HMO | Admitting: Physical Therapy

## 2023-04-17 ENCOUNTER — Encounter: Payer: Self-pay | Admitting: Nurse Practitioner

## 2023-04-17 ENCOUNTER — Ambulatory Visit (INDEPENDENT_AMBULATORY_CARE_PROVIDER_SITE_OTHER): Payer: Medicare HMO | Admitting: Nurse Practitioner

## 2023-04-17 ENCOUNTER — Encounter (HOSPITAL_BASED_OUTPATIENT_CLINIC_OR_DEPARTMENT_OTHER): Payer: Self-pay | Admitting: Physical Therapy

## 2023-04-17 VITALS — BP 112/72 | HR 101 | Temp 97.7°F | Ht 61.0 in | Wt 220.6 lb

## 2023-04-17 DIAGNOSIS — M5459 Other low back pain: Secondary | ICD-10-CM

## 2023-04-17 DIAGNOSIS — E559 Vitamin D deficiency, unspecified: Secondary | ICD-10-CM

## 2023-04-17 DIAGNOSIS — J4530 Mild persistent asthma, uncomplicated: Secondary | ICD-10-CM

## 2023-04-17 DIAGNOSIS — R2689 Other abnormalities of gait and mobility: Secondary | ICD-10-CM

## 2023-04-17 DIAGNOSIS — G8929 Other chronic pain: Secondary | ICD-10-CM

## 2023-04-17 DIAGNOSIS — E782 Mixed hyperlipidemia: Secondary | ICD-10-CM

## 2023-04-17 DIAGNOSIS — M545 Low back pain, unspecified: Secondary | ICD-10-CM | POA: Diagnosis not present

## 2023-04-17 DIAGNOSIS — Z79899 Other long term (current) drug therapy: Secondary | ICD-10-CM | POA: Diagnosis not present

## 2023-04-17 DIAGNOSIS — K219 Gastro-esophageal reflux disease without esophagitis: Secondary | ICD-10-CM

## 2023-04-17 DIAGNOSIS — M6281 Muscle weakness (generalized): Secondary | ICD-10-CM | POA: Diagnosis not present

## 2023-04-17 DIAGNOSIS — J3089 Other allergic rhinitis: Secondary | ICD-10-CM | POA: Diagnosis not present

## 2023-04-17 DIAGNOSIS — Z6837 Body mass index (BMI) 37.0-37.9, adult: Secondary | ICD-10-CM

## 2023-04-17 DIAGNOSIS — F419 Anxiety disorder, unspecified: Secondary | ICD-10-CM

## 2023-04-17 DIAGNOSIS — T63441D Toxic effect of venom of bees, accidental (unintentional), subsequent encounter: Secondary | ICD-10-CM

## 2023-04-17 DIAGNOSIS — I10 Essential (primary) hypertension: Secondary | ICD-10-CM

## 2023-04-17 DIAGNOSIS — R7309 Other abnormal glucose: Secondary | ICD-10-CM

## 2023-04-17 DIAGNOSIS — M85672 Other cyst of bone, left ankle and foot: Secondary | ICD-10-CM

## 2023-04-17 LAB — CBC WITH DIFFERENTIAL/PLATELET
Absolute Monocytes: 689 cells/uL (ref 200–950)
Lymphs Abs: 3054 cells/uL (ref 850–3900)
Monocytes Relative: 8.3 %
Platelets: 312 10*3/uL (ref 140–400)
RBC: 3.92 10*6/uL (ref 3.80–5.10)

## 2023-04-17 NOTE — Progress Notes (Signed)
FOLLOW UP  Assessment and Plan:   Hypertension Well controlled with current medications Lisinopril Monitor blood pressure at home; patient to call if consistently greater than 130/80 Continue DASH diet.   Reminder to go to the ER if any CP, SOB, nausea, dizziness, severe HA, changes vision/speech, left arm numbness and tingling and jaw pain.  Cholesterol Continue Rosuvastatin Discussed lifestyle modifications. Recommended diet heavy in fruits and veggies, omega 3's. Decrease consumption of animal meats, cheeses, and dairy products. Remain active and exercise as tolerated. Continue to monitor. Check lipids/TSH  Pre-Diabetes/Abnormal glucose Education: Reviewed 'ABCs' of diabetes management  Discussed goals to be met and/or maintained include A1C (<7) Blood pressure (<130/80) Cholesterol (LDL <70) Continue Eye Exam yearly  Continue Dental Exam Q6 mo Discussed dietary recommendations Discussed Physical Activity recommendations Check A1C  Vitamin D deficiency Continue supplement for goal of 60-100 Monitor Vitamin D levels  Obesity with co morbidities Reach out to insurance to confirm weight loss coverage Discussed Wegovy Discussed appropriate BMI Diet modification. Physical activity. Encouraged/praised to build confidence.  Allergic Rhinitis Daily antihistamine Xyzal  Continue Flonase Avoid triggers  Mild Asthma Controlled Continue Albuterol and Symbicort, Montelukast Avoid triggers  Bee Sting Reaction/ER follow up Continue Kenalog topical cream PRN Continue EpiPen as needed  Medication management All medications discussed and reviewed in full. All questions and concerns regarding medications addressed.    Cyst of left foot Improving Continue to follow with Podiatry   Low back pain Continue PT Continue Methocarbamol  Anxiety Continue medications Bupropion, Fluoxetine  Reviewed relaxation techniques.  Sleep hygiene. Recommended Cognitive Behavioral  Therapy (CBT) PRN. Recommended mindfulness meditation and exercise.   Encouraged personality growth wand development through coping techniques and problem-solving skills. Limit/Decrease/Monitor drug/alcohol intake.    GERD Continue pantoprazole No suspected reflux complications (Barret/stricture). Lifestyle modification:  wt loss, avoid meals 2-3h before bedtime. Consider eliminating food triggers:  chocolate, caffeine, EtOH, acid/spicy food.    Orders Placed This Encounter  Procedures   CBC with Differential/Platelet   COMPLETE METABOLIC PANEL WITH GFR   Hemoglobin A1c    Notify office for further evaluation and treatment, questions or concerns if any reported s/s fail to improve.   The patient was advised to call back or seek an in-person evaluation if any symptoms worsen or if the condition fails to improve as anticipated.   Further disposition pending results of labs. Discussed med's effects and SE's.    I discussed the assessment and treatment plan with the patient. The patient was provided an opportunity to ask questions and all were answered. The patient agreed with the plan and demonstrated an understanding of the instructions.  Discussed med's effects and SE's. Screening labs and tests as requested with regular follow-up as recommended.  I provided 25 minutes of face-to-face time during this encounter including counseling, chart review, and critical decision making was preformed.  Today's Plan of Care is based on a patient-centered health care approach known as shared decision making - the decisions, tests and treatments allow for patient preferences and values to be balanced with clinical evidence.     Future Appointments  Date Time Provider Department Center  04/22/2023  8:45 AM Hodor, Donnel Saxon, PTA [facility]  04/24/2023 11:00 AM Adela Glimpse, NP [facility] None  04/25/2023  8:00 AM Hodor, Donnel Saxon, PTA [facility]  04/28/2023  9:30 AM Hodor, Donnel Saxon, PTA  [facility]  04/30/2023 11:45 AM Susy Manor, PT [facility]  01/22/2024 11:00 AM Lucky Cowboy, MD [facility] None    ----------------------------------------------------------------------------------------------------------------------  HPI 67 y.o. female  presents for 3 month follow up on hypertension, cholesterol, diabetes, weight and vitamin D deficiency.   Overall she reports feeling well today.  She was seen in the ER 04/14/23 for a bee sting reaction.  Was concerned for left upper arm swelling and cellulitis.  She present with an area marked as an outline to the edema and erythema, which has now improve and almost subsidized.  She is continuing to apply topical triamcinolone which is effective.    She recently followed with Dr. Ardelle Anton, Podiatry 04/2023, for left foot pain, soft tissue cyst.  She received a steroid injection with improvement.   She is continuing PT for low back pain that interferes with gait and muscle weakness.  Feels this is improving.  Continue muscle relaxer.   She is continuing to take Bupropion and Fluoxetine for management of anxiety.  Reports effective.  Mood is stable.   She has a hx of asthma and allergies.  Currently well controlled with inhalers and oral agents, Albuterol, Symbicort, Montelukast, Xyzal, Flonase.    BMI is Body mass index is 41.68 kg/m., she has been working on diet and exercise. Wt Readings from Last 3 Encounters:  04/17/23 220 lb 9.6 oz (100.1 kg)  04/14/23 226 lb (102.5 kg)  03/24/23 226 lb (102.5 kg)    Her blood pressure has been controlled at home, today their BP is BP: 112/72 Currently on Lisinopril   She does workout around the home. She denies chest pain, shortness of breath, dizziness.   She is on cholesterol medication Rosuvastatin and denies myalgias. Her cholesterol is at goal. The cholesterol last visit was:   Lab Results  Component Value Date   CHOL 172 01/22/2023   HDL 88 01/22/2023   LDLCALC 65 01/22/2023    TRIG 108 01/22/2023   CHOLHDL 2.0 01/22/2023    She has been working on diet and exercise for prediabetes, and denies polydipsia and polyuria. Last A1C in the office was:  Lab Results  Component Value Date   HGBA1C 6.0 (H) 01/22/2023   Patient is on Vitamin D supplement.   Lab Results  Component Value Date   VD25OH 91 01/22/2023        Current Medications:  Current Outpatient Medications on File Prior to Visit  Medication Sig   albuterol (VENTOLIN HFA) 108 (90 Base) MCG/ACT inhaler Inhale 2 puffs into the lungs every 4 (four) hours as needed for wheezing or shortness of breath.   buPROPion (WELLBUTRIN XL) 300 MG 24 hr tablet Take 300 mg by mouth every morning.   EPINEPHrine 0.3 mg/0.3 mL IJ SOAJ injection See admin instructions.   FLUoxetine (PROZAC) 20 MG capsule Take 60 mg by mouth daily.   fluticasone (FLONASE) 50 MCG/ACT nasal spray Place 2 sprays into both nostrils daily.   levocetirizine (XYZAL) 5 MG tablet Take 5 mg by mouth every evening.   lisinopril (ZESTRIL) 20 MG tablet Take  1 tablet  Daily for BP                                                            /  TAKE                                         BY                                        MOUTH   methocarbamol (ROBAXIN) 500 MG tablet Take 1 tablet (500 mg total) by mouth every 8 (eight) hours as needed for muscle spasms.   methylphenidate (RITALIN) 10 MG tablet Take 10 mg by mouth 2 (two) times daily.   montelukast (SINGULAIR) 10 MG tablet Take 10 mg by mouth daily.   pantoprazole (PROTONIX) 40 MG tablet Take 1 tablet  Daily to Prevent Indigestion & Heartburn                                      /                        TAKE                       BY                       MOUTH   rosuvastatin (CRESTOR) 40 MG tablet Take 40 mg by mouth daily.   SYMBICORT 160-4.5 MCG/ACT inhaler Inhale 2 puffs into the lungs 2 (two) times daily.   triamcinolone cream (KENALOG) 0.1 % Apply 1  Application topically 2 (two) times daily.   VITAMIN D PO Take 10,000 Units by mouth daily.   No current facility-administered medications on file prior to visit.     Allergies:  Allergies  Allergen Reactions   Molds & Smuts Anaphylaxis   Biaxin [Clarithromycin]     GI upset   Prednisone     Was admitted for self harm when on prednisone before   Serevent [Salmeterol]     Palpitations   Tequin [Gatifloxacin]     GI upset. thrush     Medical History:  Past Medical History:  Diagnosis Date   Anxiety    Arthritis    Asthma    Cataract    Gout    Hyperlipidemia    Hypertension    Obese    Family history- Reviewed and unchanged Social history- Reviewed and unchanged   Review of Systems: A complete ROS was performed with pertinent positives/negatives noted in the HPI. The remainder of the ROS are negative.  Physical Exam: BP 112/72   Pulse (!) 101   Temp 97.7 F (36.5 C)   Ht 5\' 1"  (1.549 m)   Wt 220 lb 9.6 oz (100.1 kg)   LMP 11/26/2011   SpO2 99%   BMI 41.68 kg/m  Wt Readings from Last 3 Encounters:  04/17/23 220 lb 9.6 oz (100.1 kg)  04/14/23 226 lb (102.5 kg)  03/24/23 226 lb (102.5 kg)   General Appearance: Well nourished, in no apparent distress. Eyes: PERRLA, EOMs, conjunctiva no swelling or erythema Sinuses: No Frontal/maxillary tenderness ENT/Mouth: Ext aud canals clear, TMs without erythema, bulging. No erythema, swelling, or exudate on post pharynx.  Tonsils not swollen or erythematous. Hearing normal.  Neck: Supple, thyroid normal.  Respiratory: Respiratory effort normal,  BS equal bilaterally without rales, rhonchi, wheezing or stridor.  Cardio: RRR with no MRGs. Brisk peripheral pulses without edema.  Abdomen: Soft, + BS.  Non tender, no guarding, rebound, hernias, masses. Lymphatics: Non tender without lymphadenopathy.  Musculoskeletal: Full ROM, 5/5 strength, Normal gait Skin: Warm, dry without rashes, lesions, ecchymosis.  Neuro: Cranial  nerves intact. No cerebellar symptoms.  Psych: Awake and oriented X 3, normal affect, Insight and Judgment appropriate.    Adela Glimpse, NP 3:09 PM [facility]

## 2023-04-17 NOTE — Therapy (Signed)
OUTPATIENT PHYSICAL THERAPY THORACOLUMBAR  TREATMENT / PT DISCHARGE   Patient Name: Haley White MRN: [id number] DOB:26-Apr-1956, 67 y.o., female Today's Date: 04/17/2023  END OF SESSION:  PT End of Session - 04/17/23 0809     Visit Number 26    Number of Visits 26    Authorization Type aetna medicare    PT Start Time 0807    PT Stop Time 0852    PT Time Calculation (min) 45 min    Activity Tolerance Patient tolerated treatment well    Behavior During Therapy WFL for tasks assessed/performed                     Past Medical History:  Diagnosis Date   Anxiety    Arthritis    Asthma    Cataract    Gout    Hyperlipidemia    Hypertension    Obese    Past Surgical History:  Procedure Laterality Date   APPENDECTOMY  1984   CATARACT EXTRACTION W/ INTRAOCULAR LENS  IMPLANT, BILATERAL  2014   CESAREAN SECTION  1984, 1987, 1994   X3   COLONOSCOPY  11/16/2013   w/Brodie    FOOT SURGERY Left 2002   KNEE SURGERY Left 1998   ROTATOR CUFF REPAIR Right 2007   TUBAL LIGATION  1994   Patient Active Problem List   Diagnosis Date Noted   Snoring 03/24/2023   Sleep-related hypoxia 03/24/2023   Excessive daytime sleepiness 03/24/2023   At risk for obstructive sleep apnea 03/24/2023   DOE (dyspnea on exertion) 05/10/2022   Acute pulmonary embolism (HCC) 05/10/2022   Allergic rhinitis 03/15/2021   Gastro-esophageal reflux disease without esophagitis 03/15/2021   Mild persistent asthma, uncomplicated 03/15/2021   Aortic atherosclerosis (HCC) by Chest CT scan on 02/02/2018  01/10/2021   Asthma    Low back pain 03/17/2019   Lumbosacral spondylosis without myelopathy 03/17/2019   Cubital tunnel syndrome 10/14/2018   Closed fracture of distal end of right radius 07/09/2018   Pseudogout 12/09/2017   Major depressive disorder, recurrent, severe without psychotic features (HCC)    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   Suicide attempt by drug ingestion (HCC)     Medication management 08/22/2014   Vitamin D deficiency 08/22/2014   Hyperlipidemia, mixed 08/22/2014   Hypertension    Anxiety    Abnormal glucose    Class 2 severe obesity due to excess calories with serious comorbidity and body mass index (BMI) of 37.0 to 37.9 in adult [facility])     PCP: Lucky Cowboy, MD   REFERRING PROVIDER: Jene Every, MD   REFERRING DIAG: M54.51 (ICD-10-CM) - Vertebrogenic low back pain   Rationale for Evaluation and Treatment: Rehabilitation  THERAPY DIAG:  Other low back pain  Other abnormalities of gait and mobility  Muscle weakness (generalized)  ONSET DATE: >95yr  SUBJECTIVE:  SUBJECTIVE STATEMENT: Pt states she was stung on her R UE by a yellow jacket on Saturday and had a reaction.  Pt went to ED and received a steroid cream.  She is doing much better now.  Pt sees her PCP today.    Pt states she made a mistake and helped lift a chair last Friday.  Pt reports having increased back pain for about 3 days.  She is feeling better today.  Pt went to the pool yesterday and felt good.  Pt is appreciative of skilled PT services.  Pt thinks the cupping has made a huge difference.  Her worst pain is with standing and walking for a long period of time though sits and rests and then feels fine.      PERTINENT HISTORY:  Patient is a very nice 67 yo div [place]  with   HTN, HLD, Prediabetes  and Vitamin D Deficiency and had  hx/o recent Covid PNA~ May 2023  and  and in July 2023 was discovered to have a Rt sided PE and was hospitalized for Heparin & transitioned to Eliquis ( to continue 6 months  thru Jan 7 ) .    She was seen 5-6 weeks ago for evaluation for c/o  "shaking & forgetfulness " .  She reported poor short  & long term memory recall.  (Pt discontinued meds as per MD and  reports back to baseline with memory)  PAIN:  Are you having pain? No  Pain location:  bilat sides of lumbar  2/10 current, 0/10 best, 4-5/10 worst   PRECAUTIONS: Back and Fall  WEIGHT BEARING RESTRICTIONS: No  FALLS:  Has patient fallen in last 6 months? No  LIVING ENVIRONMENT: Lives with: lives alone Lives in: House/apartment Stairs: Yes: External: 3 steps; none Has following equipment at home: Single point cane  OCCUPATION: retired  PLOF: Independent  PATIENT GOALS: less pain, be able to stand and walk longer, get strength back  NEXT MD VISIT:  OBJECTIVE:    DIAGNOSTIC FINDINGS:  Pt reports recent MRI at emerg ortho but no results in Epic  4/26 MRI result   CLINICAL DATA: foot pain, soft tissue mass of left third toe US IMPRESSION: Focal tenosynovitis of an extensor tendon in the dorsal foot, with adjacent soft tissue swelling.    MEASUREMENTS:         TODAY'S TREATMENT:    Therapeutic Exercise:  Reviewed pain level, response to prior Rx, and pt presentation.  Pt performed: LTR 2x15 DKTC 5"  2x10 with physioball under knees Child's pose stretch 20sec x3   Supine alt UE/LE 2x10 Supine clams with TrA with GTB 2x10 Seated HS stretch 2x20-30 sec bilat   PT spent time educating pt concerning discharge planning and progression to personal training.  PT went through HEP and updated HEP.  Pt received an updated HEP handout. Pt was educated in correct exercises, form, and appropriate reps/sets/frequency.    FOTO:  Prior/Initial:  56/57.  Goal of 50.  PATIENT EDUCATION:  Education details: rationale of interventions, exercise form, HEP and POC/discharge planning.  PT answered pt's questions.  Person educated: Patient Education method: Medical illustrator, handout Education comprehension: verbalized understanding, returned demonstration  HOME EXERCISE PROGRAM:  Land HEP: (prefers printout) Access Code: [facility] URL:  [email or web address] Date: 02/14/2023 Prepared by: Riki Altes  Exercises - Supine Lower Trunk Rotation  - 1-2 x daily - 7 x weekly - 2 sets - 10 reps - 3 seconds hold - Supine Pelvic Tilt  -  1-2 x daily - 7 x weekly - 2 sets - 10 reps - 5seconds hold - Supine Double Knee to Chest Modified  - 1-2 x daily - 7 x weekly - 2-3 sets - 20seconds hold  Updated HEP: - Supine alternating UE/LE  - 1 x daily - 7 x weekly - 2 sets - 10 reps - Supine Core Control with Leg Extension  - 1 x daily - 7 x weekly - 2 sets - 10 reps - Hooklying Clamshell with Resistance  - 1 x daily - 4 x weekly - 2 sets - 10 reps - Child's Pose Stretch  - 1 x daily - 7 x weekly - 2-3 reps - 20 seconds hold - Seated Hamstring Stretch  - 1-2 x daily - 7 x weekly - 2 reps - 20-30 seconds hold    Access Code: [email or web address] (prefers printout) URL: [email or web address] Date: 01/07/2023 Prepared by: [facility]  Exercises - Walking March  - 1 x daily - 7 x weekly - Side Stepping with Hand Floats  - 1 x daily - 7 x weekly - Seated Straddle on Flotation Forward Breast Stroke Arms and Bicycle Legs  - 1 x daily - 7 x weekly - 3 sets - 10 reps - Shoulder Extension with Resistance  - 1 x daily - 7 x weekly - 1-2 sets - 10 reps - Split Stance Shoulder Row with Resistance  - 1 x daily - 7 x weekly - 1-2 sets - 10 reps - Standing Hip Abduction  - 1 x daily - 7 x weekly - 1-2 sets - 10 reps - Standing Hamstring Stretch on Chair  - 2 x daily - 7 x weekly - 2 reps - 20 seconds  hold - Standing 'L' Stretch at [employer]  - 1 x daily - 7 x weekly - 2 reps   - Supine March  - 1-2 x daily - 7 x weekly - 2 sets - 10 reps - Supine Transversus Abdominis Bracing - Hands on Stomach  - 2 x daily - 7 x weekly - 2 sets - 10 reps - Supine Core Control with Heel Slide  - 1 x daily - 7 x weekly - 2 sets - 10 reps  ASSESSMENT:  CLINICAL IMPRESSION: Pt has made great progress overall and is  very pleased with her progress.  She has significant improvement in pain though does still have pain with prolonged standing and ambulation.  She presents to Rx with lumbar pain today and reports she did some lifting which she shouldn't have done and had some pain.  PT spent time going through her HEP and updating her land based HEP.  She demonstrates good understanding of HEP and is independent with HEP.  Pt has met all goals except partially meeting STG #3 and LTG #5 and making progress toward LG #6.  Pt is ready for discharge.    OBJECTIVE IMPAIRMENTS: Abnormal gait, decreased activity tolerance, decreased balance, decreased endurance, decreased knowledge of use of DME, decreased mobility, difficulty walking, decreased ROM, decreased strength, obesity, and pain.   ACTIVITY LIMITATIONS: carrying, lifting, bending, sitting, standing, squatting, sleeping, stairs, transfers, bed mobility, and locomotion level  PARTICIPATION LIMITATIONS: cleaning, laundry, shopping, community activity, and yard work  PERSONAL FACTORS: Fitness, Past/current experiences, and 1-2 comorbidities: HTN, ankle dysfunction  are also affecting patient's functional outcome.   REHAB POTENTIAL: Good  CLINICAL DECISION MAKING: Evolving/moderate complexity  EVALUATION COMPLEXITY: Moderate   GOALS: Goals reviewed with patient?  Yes  SHORT TERM GOALS: Target date: 01/17/23  Pt will tolerate full aquatic sessions consistently without increase in pain and with improving function to demonstrate good toleration and effectiveness of intervention.   Baseline: Goal status: Met 01/17/23  2.  Pt will improve on 30S sit to stand test to >/= 7 (1/2 of norm value) to demonstrate improving mobility and strength Baseline: 2 Goal status: MET  3.  Pt will improve on lumbar ROM by 50% in all areas with decreasing pain Baseline: see chart Goal status: 90% MET  04/10/23  4.  Pt will report increased toleration to standing up towards 10  minutes to fix a meal. Baseline: <4 min  Goal status: ongoing 02/14/23 (max 10 mins) Met 02/18/23  5.  Pt will report an overall decrease in max/worst pain to <6/10 to improve sleep pattern and decrease physiological responses to pain Baseline: 8/10 Goal status: GOAL MET 04/10/23     LONG TERM GOALS: Target date: 02/21/23  Pt to meet stated Foto Goal of 50 Baseline: 37% primary Goal status: Met 02/18/23  2.  Pt will be indep with final HEP's (land and aquatic as appropriate) for continued management of condition  Baseline:  Goal status: GOAL MET 04/17/2023 Target date:  04/15/23  3.  Pt will improve on Berg balance test to >/= 40/56 to demonstrate a decrease in fall risk.   Baseline: 26/56 Goal status: MET 02/14/23  4.  Pt will improve on Tug test to </=  12s to demonstrate improvement in lower extremity function, mobility and decreased fall risk.  Baseline: 20.49 Goal status: MET 4/12  5.  Pt will improve on strength of bilat hips at least 1 full grade  Baseline: see chart Goal status: PARTIALLY MET--Hip ext not assessed Target date:  04/15/23  6.  Pt will be able to walk through grocery store unlimited by fatigue or pain pushing cart to demonstrate improvement in functional mobility. Baseline: >5 mins Goal status: Improved though not met. Needs to hold onto cart. (04/17/23) Target date:  04/15/23  PLAN:  PT FREQUENCY: 1 visit  PT DURATION: 1 visit  PLANNED INTERVENTIONS: Therapeutic exercises, Therapeutic activity, Neuromuscular re-education, Balance training, Gait training, Patient/Family education, Self Care, Joint mobilization, Joint manipulation, Stair training, Orthotic/Fit training, DME instructions, Aquatic Therapy, Dry Needling, Electrical stimulation, Spinal manipulation, Spinal mobilization, Cryotherapy, Moist heat, scar mobilization, Splintting, Taping, Traction, Ultrasound, Ionotophoresis 4mg /ml Dexamethasone, Manual therapy, and Re-evaluation.  PLAN FOR NEXT  SESSION:  pt to be discharged from skilled PT services due to meeting the majority of her goals and being pleased with her current functional level.  Pt is independent with aquatic and land based HEP.  She is agreeable with discharge.  Pt plans to transition to personal training.  PHYSICAL THERAPY DISCHARGE SUMMARY  Visits from Start of Care: 26  Current functional level related to goals / functional outcomes: See above   Remaining deficits: See above   Education / Equipment: Pt has a HEP.   Audie Clear III PT, DPT 04/17/23 11:26 PM

## 2023-04-17 NOTE — Progress Notes (Signed)
[facility] Haley White 67 year old female Aug 10, 1956   HOME SLEEP TEST REPORT ( by Watch PAT)   STUDY DATA :  04-17-2023    ORDERING CLINICIAN: Melvyn Novas, MD  REFERRING CLINICIANPaulla White   CLINICAL INFORMATION/HISTORY: 03-24-2023 Haley White is a 67 y.o. female patient who is seen upon referral on 03/24/2023 from PCP Dr Haley White for a Sleep consultation.  Chief concern according to patient :  " My watch tells me I don't get enough sleep and I am hypoxic"    "My children and my sister have all told me repeatedly about how badly I snore when sleeping. I have a [employer] that tracts my sleeping. I just discovered today that it not only tracts my sleeping, but also my blood oxygen level as I sleep. When I saw it for the first time this morning, it gave 80% as my blood oxygen level. I became immediately alarmed, as ever since being diagnosed with Asthma and especially since my bad case of Covid last summer, I have kept tabs on my oxygen levels. A friend of mine has Sleep Apnea and said I really ought to contact my doctor and get a referral for a sleep study. "     Epworth sleepiness score: 14/24.  FSS at 36/ 63 points GDS 3/ 15  points.    BMI: 43/m   Neck Circumference: 15.75"   Sleep Summary:   Total Recording Time (hours, min):   7 hours 0 minutes  Total Sleep Time (hours, min):      6 hours 10 minutes           Percent REM (%):    18.7%                                    Respiratory Indices by CMS criteria:   Calculated pAHI (per hour):   50.8/h                          REM pAHI:    52.9/h                                             NREM pAHI:     50.3/h                         Positional AHI: The patient slept the vast majority of the night in supine position associated with an AHI of 61.2 following AASM criteria for 48.7 when following CMS criteria.  In either case sleep position did not affect the AHI very much and remains  severe. Snoring statistics reached a mean volume of 58 dB which is unusually loud.  Snoring was therefore the vast majority of sleep time about 86%.  Almost 3 hours of sleep were associated with over 60 dB volume of snoring.                                                 Oxygen Saturation Statistics:   Oxygen Saturation (%) Mean:  Minimum oxygen saturation (%):            O2 Saturation Range (%):     Between the nadir at 71% with a maximum saturation of 98% with a mean saturation of 90%.                                  O2 Saturation (minutes) <89%: 73 minutes the equivalent of 20% of total sleep time.   This is severe hypoxia.         Pulse Rate Statistics:   Pulse Mean (bpm): 84 bpm               Pulse Range: Between 71 and 120 bpm               IMPRESSION:  This HST confirms the presence of severe sleep apnea which was neither dependent on REM sleep nor supine sleep and is associated with an extraordinary amount of hypoxia.   RECOMMENDATION: I would like to invite this patient for an in lab titration to positive airway pressure to treat her apnea but also to oxygen titration to alleviate the hypoxia associated visit.  However currently I would wait time for an in lab sleep study is far too long.  I do not have the option of prescribing oxygen without seeing the patient titrated in the sleep lab.    I would like Haley White to follow-up with a pulmonologist this may be the quicker and faster way to get finally treated and I will be happy to contact the colleagues in pulmonology about this dilemma.     My only other option for a speedy therapy is to order an Auto titration device for CPAP with factory settings between 5 and 20 cmH2O pressure, 3 cm expiratory relief, heated humidifier, interface of patient's choice.  Given the loud snoring this would likely require a full facemask fitting.      INTERPRETING PHYSICIAN:   Haley Novas, MD   [facility].

## 2023-04-17 NOTE — Progress Notes (Signed)
Subjective: Chief Complaint  Patient presents with   Foot Pain    Left foot cyst on top causing discomfort    67 year old female presents the office today with cyst of back of her left foot, discomfort.  She does not report any recent injuries.  Otherwise her foot Lynnda Child has been feeling fine.  Objective: AAO x3, NAD DP/PT pulses palpable bilaterally, CRT less than 3 seconds On the dorsal aspect left midfoot superficial soft tissue mass consistent with likely ganglion cyst.  There is no erythema or warmth.  No crepitation. The extensor tendons appear to be intact.  No other areas of significant discomfort. No pain with calf compression, swelling, warmth, erythema  Assessment: Soft tissue mass left dorsal foot  Plan: -All treatment options discussed with the patient including all alternatives, risks, complications.  -X-rays obtained reviewed left foot.  3 views were obtained.  Spurring present in the lateral view on the midfoot. -We discussed steroid injection, aspiration of the cyst.  Discussed risk of complications of this procedure including possible tendon rupture.  She is aware of the risks and wished to proceed and verbal consent was obtained.  I cleaned the skin with alcohol and mixture the amount of lidocaine, Marcaine plain was infiltrated in a regional block fashion.  I cleaned the skin with Betadine, alcohol I attempted to aspirate the cyst but not able to obtain any fluid.  I then infiltrated 0.5 and also dexamethasone phosphate, 0.5 mL's of lidocaine plain into the area.  I injected mixture avoid any neurovascular, tendon structures.  Compression bandage applied.  Postprocedure instructions discussed.  Discussed trying to stay off the foot for the next couple days and if she has the boot or surgical shoe at home she can wear that as well to help with that.  Return if symptoms worsen or fail to improve.  Vivi Barrack DPM

## 2023-04-18 ENCOUNTER — Encounter: Payer: Self-pay | Admitting: Nurse Practitioner

## 2023-04-18 ENCOUNTER — Telehealth: Payer: Self-pay

## 2023-04-18 LAB — COMPLETE METABOLIC PANEL WITH GFR
AG Ratio: 2.1 (calc) (ref 1.0–2.5)
ALT: 20 U/L (ref 6–29)
AST: 16 U/L (ref 10–35)
Albumin: 4.1 g/dL (ref 3.6–5.1)
Alkaline phosphatase (APISO): 68 U/L (ref 37–153)
BUN: 13 mg/dL (ref 7–25)
CO2: 26 mmol/L (ref 20–32)
Calcium: 9.6 mg/dL (ref 8.6–10.4)
Chloride: 106 mmol/L (ref 98–110)
Creat: 0.85 mg/dL (ref 0.50–1.05)
Globulin: 2 g/dL (calc) (ref 1.9–3.7)
Glucose, Bld: 101 mg/dL — ABNORMAL HIGH (ref 65–99)
Potassium: 4.6 mmol/L (ref 3.5–5.3)
Sodium: 143 mmol/L (ref 135–146)
Total Bilirubin: 1 mg/dL (ref 0.2–1.2)
Total Protein: 6.1 g/dL (ref 6.1–8.1)
eGFR: 76 mL/min/{1.73_m2} (ref >=60)

## 2023-04-18 LAB — CBC WITH DIFFERENTIAL/PLATELET
Basophils Absolute: 50 cells/uL (ref 0–200)
Basophils Relative: 0.6 %
Eosinophils Absolute: 291 cells/uL (ref 15–500)
Eosinophils Relative: 3.5 %
HCT: 37.5 % (ref 35.0–45.0)
Hemoglobin: 12.5 g/dL (ref 11.7–15.5)
MCH: 31.9 pg (ref 27.0–33.0)
MCHC: 33.3 g/dL (ref 32.0–36.0)
MCV: 95.7 fL (ref 80.0–100.0)
MPV: 10.9 fL (ref 7.5–12.5)
Neutro Abs: 4216 cells/uL (ref 1500–7800)
Neutrophils Relative %: 50.8 %
RDW: 12.4 % (ref 11.0–15.0)
Total Lymphocyte: 36.8 %
WBC: 8.3 10*3/uL (ref 3.8–10.8)

## 2023-04-18 LAB — HEMOGLOBIN A1C
Hgb A1c MFr Bld: 5.8 % of total Hgb — ABNORMAL HIGH (ref <5.7)
Mean Plasma Glucose: 120 mg/dL
eAG (mmol/L): 6.6 mmol/L

## 2023-04-18 NOTE — Patient Instructions (Signed)

## 2023-04-18 NOTE — Telephone Encounter (Signed)
Transition Care Management Follow-up Telephone Call Date of discharge and from where: [facility] 6/10 How have you been since you were released from the hospital? Doing well Any questions or concerns? No  Items Reviewed: Did the pt receive and understand the discharge instructions provided? Yes  Medications obtained and verified? Yes  Other? No  Any new allergies since your discharge? No  Dietary orders reviewed? No Do you have support at home? Yes     Follow up appointments reviewed:  PCP Hospital f/u appt confirmed? Yes  Scheduled to see PCP on 6/13 @ . Specialist Hospital f/u appt confirmed? No  Scheduled to see  on  @ . Are transportation arrangements needed? No  If their condition worsens, is the pt aware to call PCP or go to the Emergency Dept.? Yes Was the patient provided with contact information for the PCP's office or ED? Yes Was to pt encouraged to call back with questions or concerns? Yes

## 2023-04-20 DIAGNOSIS — Z62820 Parent-biological child conflict: Secondary | ICD-10-CM | POA: Diagnosis not present

## 2023-04-20 DIAGNOSIS — F331 Major depressive disorder, recurrent, moderate: Secondary | ICD-10-CM | POA: Diagnosis not present

## 2023-04-20 DIAGNOSIS — F411 Generalized anxiety disorder: Secondary | ICD-10-CM | POA: Diagnosis not present

## 2023-04-20 NOTE — Addendum Note (Signed)
Addended by: Melvyn Novas on: 04/20/2023 11:10 PM   Modules accepted: Orders

## 2023-04-20 NOTE — Procedures (Signed)
[facility] Haley White 67 year old female 1956-10-10   HOME SLEEP TEST REPORT ( by Watch PAT)   STUDY DATA :  04-17-2023    ORDERING CLINICIAN: Melvyn Novas, MD  REFERRING CLINICIANPaulla Dolly   CLINICAL INFORMATION/HISTORY: 03-24-2023 [name] is a 66 y.o. female patient who is seen upon referral on 03/24/2023 from PCP Dr Delila Spence for a Sleep consultation.  Chief concern according to patient :  " My watch tells me I don't get enough sleep and I am hypoxic"    "My children and my sister have all told me repeatedly about how badly I snore when sleeping. I have a [employer] that tracts my sleeping. I just discovered today that it not only tracts my sleeping, but also my blood oxygen level as I sleep. When I saw it for the first time this morning, it gave 80% as my blood oxygen level. I became immediately alarmed, as ever since being diagnosed with Asthma and especially since my bad case of Covid last summer, I have kept tabs on my oxygen levels. A friend of mine has Sleep Apnea and said I really ought to contact my doctor and get a referral for a sleep study. "     Epworth sleepiness score: 14/24.  FSS at 36/ 63 points GDS 3/ 15  points.    BMI: 43/m   Neck Circumference: 15.75"   Sleep Summary:   Total Recording Time (hours, min):   7 hours 0 minutes  Total Sleep Time (hours, min):      6 hours 10 minutes           Percent REM (%):    18.7%                                    Respiratory Indices by CMS criteria:   Calculated pAHI (per hour):   50.8/h                          REM pAHI:    52.9/h                                             NREM pAHI:     50.3/h                         Positional AHI: The patient slept the vast majority of the night in supine position associated with an AHI of 61.2 following AASM criteria for 48.7 when following CMS criteria.  In either case sleep position did not affect the AHI very much and remains  severe. Snoring statistics reached a mean volume of 58 dB which is unusually loud.  Snoring was therefore the vast majority of sleep time about 86%.  Almost 3 hours of sleep were associated with over 60 dB volume of snoring.                                                 Oxygen Saturation Statistics:   Oxygen Saturation (%) Mean:  Minimum oxygen saturation (%):            O2 Saturation Range (%):     Between the nadir at 71% with a maximum saturation of 98% with a mean saturation of 90%.                                  O2 Saturation (minutes) <89%: 73 minutes the equivalent of 20% of total sleep time.   This is severe hypoxia.         Pulse Rate Statistics:   Pulse Mean (bpm): 84 bpm               Pulse Range: Between 71 and 120 bpm               IMPRESSION:  This HST confirms the presence of severe sleep apnea which was neither dependent on REM sleep nor supine sleep and is associated with an extraordinary amount of hypoxia.   RECOMMENDATION: I would like to invite this patient for an in lab titration to positive airway pressure to treat her apnea but also to oxygen titration to alleviate the hypoxia associated visit.  However currently I would wait time for an in lab sleep study is far too long.  I do not have the option of prescribing oxygen without seeing the patient titrated in the sleep lab.    I would like Haley White to follow-up with a pulmonologist this may be the quicker and faster way to get finally treated and I will be happy to contact the colleagues in pulmonology about this dilemma.     My only other option for a speedy therapy is to order an Auto titration device for CPAP with factory settings between 5 and 20 cmH2O pressure, 3 cm expiratory relief, heated humidifier, interface of patient's choice.  Given the loud snoring this would likely require a full facemask fitting.      INTERPRETING PHYSICIAN:   Melvyn Novas, MD   [facility] at [facility].

## 2023-04-21 ENCOUNTER — Encounter: Payer: Self-pay | Admitting: Neurology

## 2023-04-21 ENCOUNTER — Encounter (HOSPITAL_BASED_OUTPATIENT_CLINIC_OR_DEPARTMENT_OTHER): Payer: Self-pay | Admitting: Physical Therapy

## 2023-04-21 ENCOUNTER — Other Ambulatory Visit: Payer: Self-pay | Admitting: Neurology

## 2023-04-21 DIAGNOSIS — R0683 Snoring: Secondary | ICD-10-CM

## 2023-04-21 DIAGNOSIS — G4733 Obstructive sleep apnea (adult) (pediatric): Secondary | ICD-10-CM

## 2023-04-21 DIAGNOSIS — G4719 Other hypersomnia: Secondary | ICD-10-CM

## 2023-04-21 DIAGNOSIS — G4734 Idiopathic sleep related nonobstructive alveolar hypoventilation: Secondary | ICD-10-CM

## 2023-04-22 ENCOUNTER — Ambulatory Visit (HOSPITAL_BASED_OUTPATIENT_CLINIC_OR_DEPARTMENT_OTHER): Payer: Medicare HMO

## 2023-04-22 ENCOUNTER — Encounter (HOSPITAL_BASED_OUTPATIENT_CLINIC_OR_DEPARTMENT_OTHER): Payer: Self-pay

## 2023-04-23 DIAGNOSIS — F331 Major depressive disorder, recurrent, moderate: Secondary | ICD-10-CM | POA: Diagnosis not present

## 2023-04-23 DIAGNOSIS — Z62820 Parent-biological child conflict: Secondary | ICD-10-CM | POA: Diagnosis not present

## 2023-04-23 DIAGNOSIS — F411 Generalized anxiety disorder: Secondary | ICD-10-CM | POA: Diagnosis not present

## 2023-04-24 ENCOUNTER — Ambulatory Visit: Payer: Medicare HMO | Admitting: Nurse Practitioner

## 2023-04-24 DIAGNOSIS — J301 Allergic rhinitis due to pollen: Secondary | ICD-10-CM | POA: Diagnosis not present

## 2023-04-24 DIAGNOSIS — J3089 Other allergic rhinitis: Secondary | ICD-10-CM | POA: Diagnosis not present

## 2023-04-24 DIAGNOSIS — J3081 Allergic rhinitis due to animal (cat) (dog) hair and dander: Secondary | ICD-10-CM | POA: Diagnosis not present

## 2023-04-25 ENCOUNTER — Encounter (HOSPITAL_BASED_OUTPATIENT_CLINIC_OR_DEPARTMENT_OTHER): Payer: Medicare HMO

## 2023-04-28 ENCOUNTER — Encounter (HOSPITAL_BASED_OUTPATIENT_CLINIC_OR_DEPARTMENT_OTHER): Payer: Medicare HMO

## 2023-04-28 DIAGNOSIS — J3081 Allergic rhinitis due to animal (cat) (dog) hair and dander: Secondary | ICD-10-CM | POA: Diagnosis not present

## 2023-04-28 DIAGNOSIS — J3089 Other allergic rhinitis: Secondary | ICD-10-CM | POA: Diagnosis not present

## 2023-04-28 DIAGNOSIS — J301 Allergic rhinitis due to pollen: Secondary | ICD-10-CM | POA: Diagnosis not present

## 2023-04-29 ENCOUNTER — Encounter (HOSPITAL_BASED_OUTPATIENT_CLINIC_OR_DEPARTMENT_OTHER): Payer: Self-pay

## 2023-04-29 ENCOUNTER — Telehealth: Payer: Self-pay

## 2023-04-29 ENCOUNTER — Ambulatory Visit (INDEPENDENT_AMBULATORY_CARE_PROVIDER_SITE_OTHER): Payer: Medicare HMO | Admitting: Pulmonary Disease

## 2023-04-29 ENCOUNTER — Encounter (HOSPITAL_BASED_OUTPATIENT_CLINIC_OR_DEPARTMENT_OTHER): Payer: Self-pay | Admitting: Pulmonary Disease

## 2023-04-29 VITALS — BP 132/86 | HR 97 | Temp 98.2°F | Ht 61.0 in | Wt 219.8 lb

## 2023-04-29 DIAGNOSIS — G4733 Obstructive sleep apnea (adult) (pediatric): Secondary | ICD-10-CM

## 2023-04-29 NOTE — Progress Notes (Unsigned)
[facility]  Chief Complaint  Patient presents with   Consult    Patient here about her sleep.     Past Surgical History:  She  has a past surgical history that includes Knee surgery (Left, 1998); Foot surgery (Left, 2002); Appendectomy (1984); Cataract extraction w/ intraocular lens  implant, bilateral (2014); Cesarean section (1984, 1987, 1994); Tubal ligation (1994); Rotator cuff repair (Right, 2007); and Colonoscopy (11/16/2013).  Past Medical History:    Constitutional:  BP 132/86 (BP Location: Right Arm, Patient Position: Sitting, Cuff Size: Normal)   Pulse 97   Temp 98.2 F (36.8 C) (Oral)   Ht 5\' 1"  (1.549 m)   Wt 219 lb 12.8 oz (99.7 kg)   LMP 11/26/2011   SpO2 96%   BMI 41.53 kg/m   Brief Summary:  EMMELY BITTINGER is a 67 y.o. female with       Subjective:    Physical Exam:   Appearance - well kempt   ENMT - no sinus tenderness, no oral exudate, no LAN, Mallampati *** airway, no stridor  Respiratory - equal breath sounds bilaterally, no wheezing or rales  CV - s1s2 regular rate and rhythm, no murmurs  Ext - no clubbing, no edema  Skin - no rashes  Psych - normal mood and affect   Pulmonary testing:    Chest Imaging:    Sleep Tests:    Cardiac Tests:    Social History:  She  reports that she quit smoking about 45 years ago. Her smoking use included cigarettes. She has a 7.00 pack-year smoking history. She has never used smokeless tobacco. She reports current alcohol use of about 4.0 standard drinks of alcohol per week. She reports that she does not use drugs.  Family History:  Her family history includes Anxiety disorder in her sister; Dementia in her father.    Discussion:    Assessment/Plan:    Time Spent Involved in Patient Care on Day of Examination:    Follow up:  There are no Patient Instructions on file for this visit.  Medication List:   Allergies as of 04/29/2023        Reactions   Molds & Smuts Anaphylaxis   Biaxin [clarithromycin]    GI upset   Prednisone    Was admitted for self harm when on prednisone before   Serevent [salmeterol]    Palpitations   Tequin [gatifloxacin]    GI upset. thrush        Medication List        Accurate as of April 29, 2023 11:25 AM. If you have any questions, ask your nurse or doctor.          albuterol 108 (90 Base) MCG/ACT inhaler Commonly known as: Ventolin HFA Inhale 2 puffs into the lungs every 4 (four) hours as needed for wheezing or shortness of breath.   buPROPion 300 MG 24 hr tablet Commonly known as: WELLBUTRIN XL Take 300 mg by mouth every morning.   EPINEPHrine 0.3 mg/0.3 mL Soaj injection Commonly known as: EPI-PEN See admin instructions.   FLUoxetine 20 MG capsule Commonly known as: PROZAC Take 60 mg by mouth daily.   fluticasone 50 MCG/ACT nasal spray Commonly known as: FLONASE Place 2 sprays into both nostrils daily.   levocetirizine 5 MG tablet Commonly known as: XYZAL Take 5 mg by mouth every evening.   lisinopril 20 MG tablet Commonly known as: ZESTRIL Take  1 tablet  Daily for BP                                                            /  TAKE                                         BY                                        MOUTH   methocarbamol 500 MG tablet Commonly known as: ROBAXIN Take 1 tablet (500 mg total) by mouth every 8 (eight) hours as needed for muscle spasms.   methylphenidate 10 MG tablet Commonly known as: RITALIN Take 10 mg by mouth 2 (two) times daily.   montelukast 10 MG tablet Commonly known as: SINGULAIR Take 10 mg by mouth daily.   pantoprazole 40 MG tablet Commonly known as: PROTONIX Take 1 tablet  Daily to Prevent Indigestion & Heartburn                                      /                        TAKE                       BY                       MOUTH   rosuvastatin 40 MG tablet Commonly known as:  CRESTOR Take 40 mg by mouth daily.   Symbicort 160-4.5 MCG/ACT inhaler Generic drug: budesonide-formoterol Inhale 2 puffs into the lungs 2 (two) times daily.   triamcinolone cream 0.1 % Commonly known as: KENALOG Apply 1 Application topically 2 (two) times daily.   VITAMIN D PO Take 10,000 Units by mouth daily.        Signature:  Coralyn Helling, MD [facility] Pager - [phone] 04/29/2023, 11:25 AM

## 2023-04-29 NOTE — Telephone Encounter (Signed)
Recall placed. Nothing further needed at this time.

## 2023-04-29 NOTE — Telephone Encounter (Signed)
The following HST results was received:  Result Notes   Melvyn Novas, MD 04/20/2023 11:10 PM EDT Back to Top    URGENT-SEVERE SLEEP APNEA and SEVERE HYPOXIA- I would like to invite this patient for an in lab titration to positive airway pressure to treat her apnea but also to oxygen titration to alleviate the hypoxia associated with it.  However , currently I would wait time for an in lab sleep study is far too long.  I do not have the option of prescribing oxygen without seeing the patient titrated in the sleep lab.     I will therefor ask this patient to be seen by PULMONOLOGY which would be a the appropriate medical speciality for oxygen needs.   Dr. Celine Mans your next available unblocked appointment is August 26th please advise.

## 2023-04-29 NOTE — Telephone Encounter (Signed)
I think this has been handled offline. Current plan is for Dr. Vickey Huger to start treating her with autocpap and then do an ONO while on autocpap to see if she still needs oxygen. I can see her back for 6 month follow up approx November 2024 after all of this has transpired.

## 2023-04-30 ENCOUNTER — Encounter (HOSPITAL_BASED_OUTPATIENT_CLINIC_OR_DEPARTMENT_OTHER): Payer: Medicare HMO | Admitting: Physical Therapy

## 2023-05-01 DIAGNOSIS — G5602 Carpal tunnel syndrome, left upper limb: Secondary | ICD-10-CM | POA: Diagnosis not present

## 2023-05-05 DIAGNOSIS — J3081 Allergic rhinitis due to animal (cat) (dog) hair and dander: Secondary | ICD-10-CM | POA: Diagnosis not present

## 2023-05-05 DIAGNOSIS — J301 Allergic rhinitis due to pollen: Secondary | ICD-10-CM | POA: Diagnosis not present

## 2023-05-05 DIAGNOSIS — J3089 Other allergic rhinitis: Secondary | ICD-10-CM | POA: Diagnosis not present

## 2023-05-07 ENCOUNTER — Telehealth: Payer: Self-pay

## 2023-05-07 NOTE — Telephone Encounter (Signed)
We received a faxes from [facility] stating Their office has been unable to reach the patient to schedule a visit. If [facility] don't hear from the patient by 05/17/23, they assume patient not wanting the equipment and your patient order will be cancelled.

## 2023-05-13 ENCOUNTER — Telehealth: Payer: Self-pay | Admitting: Neurology

## 2023-05-13 DIAGNOSIS — G4733 Obstructive sleep apnea (adult) (pediatric): Secondary | ICD-10-CM

## 2023-05-13 DIAGNOSIS — G4734 Idiopathic sleep related nonobstructive alveolar hypoventilation: Secondary | ICD-10-CM

## 2023-05-13 NOTE — Telephone Encounter (Signed)
To: Melvyn Novas, MD; Charlott Holler, MD   Ms. Haley White came to the [facility] office today.  Unfortunately, she didn't get the message that she didn't need a consult visit with me.    I explained to her the plan about getting auto CPAP set up and then overnight oximetry with CPAP, and that she would only need the CPAP titration study done if her oxygen level was still low with auto CPAP use.   I had her visit today voided, and asked her to follow up with [facility] for questions about her auto CPAP set up.   Vineet Sood

## 2023-05-14 DIAGNOSIS — J3089 Other allergic rhinitis: Secondary | ICD-10-CM | POA: Diagnosis not present

## 2023-05-14 DIAGNOSIS — J3081 Allergic rhinitis due to animal (cat) (dog) hair and dander: Secondary | ICD-10-CM | POA: Diagnosis not present

## 2023-05-14 DIAGNOSIS — G4733 Obstructive sleep apnea (adult) (pediatric): Secondary | ICD-10-CM | POA: Diagnosis not present

## 2023-05-14 DIAGNOSIS — J301 Allergic rhinitis due to pollen: Secondary | ICD-10-CM | POA: Diagnosis not present

## 2023-05-15 DIAGNOSIS — M79642 Pain in left hand: Secondary | ICD-10-CM | POA: Diagnosis not present

## 2023-05-15 DIAGNOSIS — M542 Cervicalgia: Secondary | ICD-10-CM | POA: Diagnosis not present

## 2023-05-15 NOTE — Progress Notes (Signed)
Assessment and Plan: Haley White was seen today for herpes zoster.  Diagnoses and all orders for this visit:  Essential hypertension - continue medications, DASH diet, exercise and monitor at home. Call if greater than 130/80.    Herpes zoster without complication Use Vatrex TID x 7 Unable to take gabapentin due to memory issues with med, use Tramadol sparingly Rest If no relief within next week or worsens notify the office -     traMADol (ULTRAM) 50 MG tablet; Take 1 tablet (50 mg total) by mouth every 6 (six) hours as needed. -     valACYclovir (VALTREX) 1000 MG tablet; Take 1 tablet (1,000 mg total) by mouth 3 (three) times daily. Take for 7 days       Further disposition pending results of labs. Discussed med's effects and SE's.   Over 30 minutes of exam, counseling, chart review, and critical decision making was performed.   Future Appointments  Date Time Provider Department Center  07/24/2023 10:30 AM Adela Glimpse, NP [facility] None  01/22/2024 11:00 AM Lucky Cowboy, MD [facility] None    ------------------------------------------------------------------------------------------------------------------   HPI BP 130/80   Pulse 83   Temp (!) 97.5 F (36.4 C)   Ht 5\' 1"  (1.549 m)   Wt 222 lb (100.7 kg)   LMP 11/26/2011   SpO2 97%   BMI 41.95 kg/m   67 y.o.female presents for evaluation of painful rash.  Approximately 2 days ago she developed a painful rash on right buttock.  Prior to rash she had pain in the area for 3 days. She is unable to use gabapentin due to memory changes with the medication.   BP is currently well controlled with Lisinopril 20 mg every day . Denies headaches, chest pain, shortness of breath and dizziness.  BP Readings from Last 3 Encounters:  05/16/23 130/80  04/29/23 132/86  04/17/23 112/72     Past Medical History:  Diagnosis Date   Anxiety    Arthritis    Asthma    Cataract    Gout    Hyperlipidemia    Hypertension    Obese       Allergies  Allergen Reactions   Molds & Smuts Anaphylaxis   Biaxin [Clarithromycin]     GI upset   Prednisone     Was admitted for self harm when on prednisone before   Serevent [Salmeterol]     Palpitations   Tequin [Gatifloxacin]     GI upset. thrush    Current Outpatient Medications on File Prior to Visit  Medication Sig   albuterol (VENTOLIN HFA) 108 (90 Base) MCG/ACT inhaler Inhale 2 puffs into the lungs every 4 (four) hours as needed for wheezing or shortness of breath.   buPROPion (WELLBUTRIN XL) 300 MG 24 hr tablet Take 300 mg by mouth every morning.   EPINEPHrine 0.3 mg/0.3 mL IJ SOAJ injection See admin instructions.   FLUoxetine (PROZAC) 20 MG capsule Take 60 mg by mouth daily.   fluticasone (FLONASE) 50 MCG/ACT nasal spray Place 2 sprays into both nostrils daily.   levocetirizine (XYZAL) 5 MG tablet Take 5 mg by mouth every evening.   lisinopril (ZESTRIL) 20 MG tablet Take  1 tablet  Daily for BP                                                            /  TAKE                                         BY                                        MOUTH   methocarbamol (ROBAXIN) 500 MG tablet Take 1 tablet (500 mg total) by mouth every 8 (eight) hours as needed for muscle spasms.   methylphenidate (RITALIN) 10 MG tablet Take 10 mg by mouth 2 (two) times daily.   montelukast (SINGULAIR) 10 MG tablet Take 10 mg by mouth daily.   pantoprazole (PROTONIX) 40 MG tablet Take 1 tablet  Daily to Prevent Indigestion & Heartburn                                      /                        TAKE                       BY                       MOUTH   rosuvastatin (CRESTOR) 40 MG tablet Take 40 mg by mouth daily.   SYMBICORT 160-4.5 MCG/ACT inhaler Inhale 2 puffs into the lungs 2 (two) times daily.   triamcinolone cream (KENALOG) 0.1 % Apply 1 Application topically 2 (two) times daily.   VITAMIN D PO Take 10,000 Units by mouth daily.   No current  facility-administered medications on file prior to visit.    ROS: all negative except above.   Physical Exam:  BP 130/80   Pulse 83   Temp (!) 97.5 F (36.4 C)   Ht 5\' 1"  (1.549 m)   Wt 222 lb (100.7 kg)   LMP 11/26/2011   SpO2 97%   BMI 41.95 kg/m   General Appearance: Well nourished female in moderate pain- shaking Eyes: PERRLA, EOMs, conjunctiva no swelling or erythema Neck: Supple, thyroid normal.  Respiratory: Respiratory effort normal, BS equal bilaterally without rales, rhonchi, wheezing or stridor.  Cardio: RRR with no MRGs. Brisk peripheral pulses without edema.  Abdomen: Soft, + BS.  Non tender, no guarding, rebound, hernias, masses. Musculoskeletal: Full ROM, 5/5 strength, normal gait.  Skin: Warm, dry . Shingles patch on right buttock, not open or draining Neuro: Cranial nerves intact. Normal muscle tone, no cerebellar symptoms. Sensation intact.  Psych: Awake and oriented X 3, normal affect, Insight and Judgment appropriate.     Raynelle Dick, NP 10:48 AM [facility]

## 2023-05-15 NOTE — Telephone Encounter (Signed)
Patient lvm on 7/10 with the sleep lab stating that she picked up her CPAP machine yesterday. She was calling to schedule her 31-91 day CPAP f/u appt. She states that appt needs to be scheduled 8/9-10/8/24. Please call patient back to get this appt scheduled.

## 2023-05-16 ENCOUNTER — Ambulatory Visit (INDEPENDENT_AMBULATORY_CARE_PROVIDER_SITE_OTHER): Payer: Medicare HMO | Admitting: Nurse Practitioner

## 2023-05-16 ENCOUNTER — Encounter: Payer: Self-pay | Admitting: Nurse Practitioner

## 2023-05-16 VITALS — BP 130/80 | HR 83 | Temp 97.5°F | Ht 61.0 in | Wt 222.0 lb

## 2023-05-16 DIAGNOSIS — I10 Essential (primary) hypertension: Secondary | ICD-10-CM | POA: Diagnosis not present

## 2023-05-16 DIAGNOSIS — B029 Zoster without complications: Secondary | ICD-10-CM | POA: Diagnosis not present

## 2023-05-16 MED ORDER — VALACYCLOVIR HCL 1 G PO TABS
1000.0000 mg | ORAL_TABLET | Freq: Three times a day (TID) | ORAL | 0 refills | Status: DC
Start: 2023-05-16 — End: 2023-08-11

## 2023-05-16 MED ORDER — TRAMADOL HCL 50 MG PO TABS
50.0000 mg | ORAL_TABLET | Freq: Four times a day (QID) | ORAL | 0 refills | Status: DC | PRN
Start: 2023-05-16 — End: 2023-07-20

## 2023-05-16 NOTE — Patient Instructions (Signed)

## 2023-05-18 DIAGNOSIS — Z62820 Parent-biological child conflict: Secondary | ICD-10-CM | POA: Diagnosis not present

## 2023-05-18 DIAGNOSIS — F411 Generalized anxiety disorder: Secondary | ICD-10-CM | POA: Diagnosis not present

## 2023-05-18 DIAGNOSIS — F331 Major depressive disorder, recurrent, moderate: Secondary | ICD-10-CM | POA: Diagnosis not present

## 2023-05-19 ENCOUNTER — Telehealth: Payer: Self-pay | Admitting: Internal Medicine

## 2023-05-19 ENCOUNTER — Telehealth: Payer: Self-pay | Admitting: Nurse Practitioner

## 2023-05-19 DIAGNOSIS — G4733 Obstructive sleep apnea (adult) (pediatric): Secondary | ICD-10-CM

## 2023-05-19 DIAGNOSIS — R0683 Snoring: Secondary | ICD-10-CM | POA: Diagnosis not present

## 2023-05-19 NOTE — Telephone Encounter (Signed)
Please order Auto-CPAP 5-20, 3 cm expiratory relief, heated humidifier, interface of patient's choice with full facemask.   Please instruct the patient to call us once she receives the CPAP so we can order an ONO while on CPAP.

## 2023-05-19 NOTE — Telephone Encounter (Signed)
Called pt and LVM for pt to call back and schedule Initial Cpap appt between 8/11-10/10.

## 2023-05-19 NOTE — Telephone Encounter (Signed)
Patient saw you last week and was diagnosed with shingles. She states that she takes her elderly mother every Friday to get her hair done. How long does she need to stay away from her to ensure that she isn't exposed?

## 2023-05-19 NOTE — Telephone Encounter (Signed)
She should be able to take her Mom Friday without concern for transmission

## 2023-05-20 ENCOUNTER — Telehealth: Payer: Self-pay | Admitting: Neurology

## 2023-05-20 ENCOUNTER — Encounter: Payer: Self-pay | Admitting: Neurology

## 2023-05-20 ENCOUNTER — Telehealth: Payer: Self-pay

## 2023-05-20 DIAGNOSIS — J301 Allergic rhinitis due to pollen: Secondary | ICD-10-CM | POA: Diagnosis not present

## 2023-05-20 NOTE — Telephone Encounter (Signed)
Pt completed ONO on CPAP. There was 7 hours 38 min 9 seconds recorded. During the time there was only 55 seconds of time spent below 89%. Pt doesn't appear to need to add oxygen to the machine. Will have Dr Dohmeier review.

## 2023-05-20 NOTE — Telephone Encounter (Signed)
I have placed the order for CPAP  Called the pt to give instructions and there was no answer- LMTCB

## 2023-05-20 NOTE — Telephone Encounter (Signed)
I received a message via mychart from the patient stating she was having trouble getting in touch with [place] to schedule a follow up for her CPAP. I attempted to call the patient, but got voicemail. I left a voice message for her stating that we would try to reach her again to schedule. Please confirm the date that the patient got her CPAP and schedule 60-90 days out from that date.

## 2023-05-21 ENCOUNTER — Telehealth: Payer: Self-pay | Admitting: Neurology

## 2023-05-21 NOTE — Telephone Encounter (Signed)
Pt was scheduled for Initial CPAP on 08/11/23. Pt was informed to bring machine and power cord to appt. DME: [facility] Phone: [phone] Fax: [phone] Equipment issued Airsense 10 on 05/14/23 Pt to be scheduled between: 06/15/23-08/14/23

## 2023-05-23 NOTE — Telephone Encounter (Signed)
Charlott Holler, MD  Augustine Brannick, Porfirio Mylar, MD; Lucky Cowboy, MD Hi there, Thanks for clarifying what your request was. We will take over management of this and I will make sure she has follow up with me.  Durel Salts       Previous Messages    ----- Message ----- From: Melvyn Novas, MD Sent: 05/13/2023   5:10 PM EDT To: Lucky Cowboy, MD; Charlott Holler, MD Subject: oxygen needs in OSA patient.                  Hello Dr Cheron Schaumann, I may have asked not in the most direct manner, but my intention was to have her follow up with pulmonology- . Now we are going to order CPAP through [facility] at [facility] and then do an ONO on CPAP first? I am ok to do that- will you follow up if her severe hypoxia persists?    I don't want our patient to feel she is falling between the cracks.  Vic Ripper Dawanda Mapel.

## 2023-05-26 NOTE — Telephone Encounter (Signed)
We have already started the pt on auto cpap, the ono on CPAP was also already completed and documented that she didn't have oxygen concerns. The patient was scheduled for her initial cpap follow up. I don't think pulmonology is needing to be involoved in cpap management.

## 2023-05-26 NOTE — Telephone Encounter (Signed)
Patient has been scheduled for initial CPAP appointment.

## 2023-05-28 DIAGNOSIS — J3081 Allergic rhinitis due to animal (cat) (dog) hair and dander: Secondary | ICD-10-CM | POA: Diagnosis not present

## 2023-05-28 DIAGNOSIS — J3089 Other allergic rhinitis: Secondary | ICD-10-CM | POA: Diagnosis not present

## 2023-05-28 DIAGNOSIS — M542 Cervicalgia: Secondary | ICD-10-CM | POA: Diagnosis not present

## 2023-05-28 DIAGNOSIS — J301 Allergic rhinitis due to pollen: Secondary | ICD-10-CM | POA: Diagnosis not present

## 2023-06-01 DIAGNOSIS — F411 Generalized anxiety disorder: Secondary | ICD-10-CM | POA: Diagnosis not present

## 2023-06-01 DIAGNOSIS — Z62820 Parent-biological child conflict: Secondary | ICD-10-CM | POA: Diagnosis not present

## 2023-06-01 DIAGNOSIS — F331 Major depressive disorder, recurrent, moderate: Secondary | ICD-10-CM | POA: Diagnosis not present

## 2023-06-02 ENCOUNTER — Emergency Department (HOSPITAL_COMMUNITY): Payer: Medicare HMO

## 2023-06-02 ENCOUNTER — Encounter (HOSPITAL_COMMUNITY): Payer: Self-pay | Admitting: Internal Medicine

## 2023-06-02 ENCOUNTER — Inpatient Hospital Stay (HOSPITAL_COMMUNITY)
Admission: EM | Admit: 2023-06-02 | Discharge: 2023-06-13 | DRG: 494 | Disposition: A | Discharge status: Skilled Nursing Facility | Payer: Medicare HMO | Attending: Internal Medicine | Admitting: Internal Medicine

## 2023-06-02 ENCOUNTER — Other Ambulatory Visit: Payer: Self-pay

## 2023-06-02 DIAGNOSIS — S92102A Unspecified fracture of left talus, initial encounter for closed fracture: Secondary | ICD-10-CM | POA: Diagnosis not present

## 2023-06-02 DIAGNOSIS — S82892A Other fracture of left lower leg, initial encounter for closed fracture: Secondary | ICD-10-CM | POA: Diagnosis not present

## 2023-06-02 DIAGNOSIS — S82202A Unspecified fracture of shaft of left tibia, initial encounter for closed fracture: Secondary | ICD-10-CM | POA: Diagnosis not present

## 2023-06-02 DIAGNOSIS — Z7951 Long term (current) use of inhaled steroids: Secondary | ICD-10-CM

## 2023-06-02 DIAGNOSIS — S82852A Displaced trimalleolar fracture of left lower leg, initial encounter for closed fracture: Principal | ICD-10-CM | POA: Diagnosis present

## 2023-06-02 DIAGNOSIS — K5900 Constipation, unspecified: Secondary | ICD-10-CM | POA: Diagnosis present

## 2023-06-02 DIAGNOSIS — F419 Anxiety disorder, unspecified: Secondary | ICD-10-CM | POA: Diagnosis present

## 2023-06-02 DIAGNOSIS — S82432A Displaced oblique fracture of shaft of left fibula, initial encounter for closed fracture: Secondary | ICD-10-CM | POA: Diagnosis not present

## 2023-06-02 DIAGNOSIS — S9305XA Dislocation of left ankle joint, initial encounter: Secondary | ICD-10-CM | POA: Diagnosis present

## 2023-06-02 DIAGNOSIS — W19XXXA Unspecified fall, initial encounter: Secondary | ICD-10-CM

## 2023-06-02 DIAGNOSIS — S82899A Other fracture of unspecified lower leg, initial encounter for closed fracture: Secondary | ICD-10-CM | POA: Diagnosis present

## 2023-06-02 DIAGNOSIS — Z888 Allergy status to other drugs, medicaments and biological substances status: Secondary | ICD-10-CM

## 2023-06-02 DIAGNOSIS — I10 Essential (primary) hypertension: Secondary | ICD-10-CM | POA: Diagnosis present

## 2023-06-02 DIAGNOSIS — Z79899 Other long term (current) drug therapy: Secondary | ICD-10-CM

## 2023-06-02 DIAGNOSIS — F32A Depression, unspecified: Secondary | ICD-10-CM | POA: Diagnosis present

## 2023-06-02 DIAGNOSIS — S82852D Displaced trimalleolar fracture of left lower leg, subsequent encounter for closed fracture with routine healing: Secondary | ICD-10-CM | POA: Diagnosis not present

## 2023-06-02 DIAGNOSIS — S82452A Displaced comminuted fracture of shaft of left fibula, initial encounter for closed fracture: Secondary | ICD-10-CM | POA: Diagnosis not present

## 2023-06-02 DIAGNOSIS — R0902 Hypoxemia: Secondary | ICD-10-CM | POA: Diagnosis not present

## 2023-06-02 DIAGNOSIS — Y92007 Garden or yard of unspecified non-institutional (private) residence as the place of occurrence of the external cause: Secondary | ICD-10-CM

## 2023-06-02 DIAGNOSIS — W010XXA Fall on same level from slipping, tripping and stumbling without subsequent striking against object, initial encounter: Secondary | ICD-10-CM | POA: Diagnosis present

## 2023-06-02 DIAGNOSIS — S99912A Unspecified injury of left ankle, initial encounter: Secondary | ICD-10-CM | POA: Diagnosis not present

## 2023-06-02 DIAGNOSIS — K219 Gastro-esophageal reflux disease without esophagitis: Secondary | ICD-10-CM | POA: Diagnosis present

## 2023-06-02 DIAGNOSIS — Z7982 Long term (current) use of aspirin: Secondary | ICD-10-CM

## 2023-06-02 DIAGNOSIS — R55 Syncope and collapse: Secondary | ICD-10-CM | POA: Diagnosis not present

## 2023-06-02 DIAGNOSIS — M25572 Pain in left ankle and joints of left foot: Secondary | ICD-10-CM | POA: Insufficient documentation

## 2023-06-02 DIAGNOSIS — Y93K1 Activity, walking an animal: Secondary | ICD-10-CM

## 2023-06-02 DIAGNOSIS — M19072 Primary osteoarthritis, left ankle and foot: Secondary | ICD-10-CM | POA: Diagnosis not present

## 2023-06-02 DIAGNOSIS — R1310 Dysphagia, unspecified: Secondary | ICD-10-CM | POA: Diagnosis present

## 2023-06-02 DIAGNOSIS — E785 Hyperlipidemia, unspecified: Secondary | ICD-10-CM | POA: Diagnosis present

## 2023-06-02 DIAGNOSIS — Z743 Need for continuous supervision: Secondary | ICD-10-CM | POA: Diagnosis not present

## 2023-06-02 DIAGNOSIS — Y92009 Unspecified place in unspecified non-institutional (private) residence as the place of occurrence of the external cause: Secondary | ICD-10-CM | POA: Diagnosis not present

## 2023-06-02 DIAGNOSIS — S8252XA Displaced fracture of medial malleolus of left tibia, initial encounter for closed fracture: Secondary | ICD-10-CM | POA: Diagnosis not present

## 2023-06-02 HISTORY — DX: Unspecified asthma, uncomplicated: J45.909

## 2023-06-02 HISTORY — DX: Unspecified osteoarthritis, unspecified site: M19.90

## 2023-06-02 HISTORY — DX: Essential (primary) hypertension: I10

## 2023-06-02 HISTORY — DX: Depression, unspecified: F32.A

## 2023-06-02 LAB — CBC WITH DIFFERENTIAL/PLATELET
Abs Immature Granulocytes: 0.03 10*3/uL (ref 0.00–0.07)
Basophils Absolute: 0.1 10*3/uL (ref 0.0–0.1)
Basophils Relative: 1 %
Eosinophils Absolute: 0.1 10*3/uL (ref 0.0–0.5)
Eosinophils Relative: 1 %
HCT: 35.1 % — ABNORMAL LOW (ref 36.0–46.0)
Hemoglobin: 11.6 g/dL — ABNORMAL LOW (ref 12.0–15.0)
Immature Granulocytes: 0 %
Lymphocytes Relative: 28 %
Lymphs Abs: 2.7 10*3/uL (ref 0.7–4.0)
MCH: 31.6 pg (ref 26.0–34.0)
MCHC: 33 g/dL (ref 30.0–36.0)
MCV: 95.6 fL (ref 80.0–100.0)
Monocytes Absolute: 1.2 10*3/uL — ABNORMAL HIGH (ref 0.1–1.0)
Monocytes Relative: 12 %
Neutro Abs: 5.6 10*3/uL (ref 1.7–7.7)
Neutrophils Relative %: 58 %
Platelets: 255 10*3/uL (ref 150–400)
RBC: 3.67 MIL/uL — ABNORMAL LOW (ref 3.87–5.11)
RDW: 12.1 % (ref 11.5–15.5)
WBC: 9.7 10*3/uL (ref 4.0–10.5)
nRBC: 0 % (ref 0.0–0.2)

## 2023-06-02 LAB — COMPREHENSIVE METABOLIC PANEL
ALT: 27 U/L (ref 0–44)
AST: 23 U/L (ref 15–41)
Albumin: 3.7 g/dL (ref 3.5–5.0)
Alkaline Phosphatase: 51 U/L (ref 38–126)
Anion gap: 14 (ref 5–15)
BUN: 16 mg/dL (ref 8–23)
CO2: 23 mmol/L (ref 22–32)
Calcium: 8.6 mg/dL — ABNORMAL LOW (ref 8.9–10.3)
Chloride: 105 mmol/L (ref 98–111)
Creatinine, Ser: 0.96 mg/dL (ref 0.44–1.00)
GFR, Estimated: >60 mL/min (ref >60)
Glucose, Bld: 87 mg/dL (ref 70–99)
Potassium: 4.1 mmol/L (ref 3.5–5.1)
Sodium: 142 mmol/L (ref 135–145)
Total Bilirubin: 1.1 mg/dL (ref 0.3–1.2)
Total Protein: 5.8 g/dL — ABNORMAL LOW (ref 6.5–8.1)

## 2023-06-02 LAB — MAGNESIUM: Magnesium: 1.7 mg/dL (ref 1.7–2.4)

## 2023-06-02 LAB — VITAMIN D 25 HYDROXY (VIT D DEFICIENCY, FRACTURES): Vit D, 25-Hydroxy: 80.96 ng/mL (ref 30–100)

## 2023-06-02 MED ORDER — MELATONIN 3 MG PO TABS
3.0000 mg | ORAL_TABLET | Freq: Every evening | ORAL | Status: DC | PRN
  Administered 2023-06-02 – 2023-06-11 (×3): 3 mg via ORAL
  Filled 2023-06-02 (×3): qty 1

## 2023-06-02 MED ORDER — PROPOFOL 10 MG/ML IV BOLUS
INTRAVENOUS | Status: AC
  Administered 2023-06-02: 100 mg via INTRAVENOUS
  Filled 2023-06-02: qty 40

## 2023-06-02 MED ORDER — HYDROMORPHONE HCL 1 MG/ML IJ SOLN
0.5000 mg | INTRAMUSCULAR | Status: DC | PRN
  Administered 2023-06-02 – 2023-06-03 (×3): 0.5 mg via INTRAVENOUS
  Filled 2023-06-02: qty 0.5
  Filled 2023-06-02: qty 1
  Filled 2023-06-02: qty 0.5

## 2023-06-02 MED ORDER — NALOXONE HCL 0.4 MG/ML IJ SOLN: 0.4000 mg | INTRAMUSCULAR | Status: DC | PRN

## 2023-06-02 MED ORDER — ONDANSETRON HCL 4 MG/2ML IJ SOLN
4.0000 mg | Freq: Four times a day (QID) | INTRAMUSCULAR | Status: DC | PRN
  Administered 2023-06-02: 4 mg via INTRAVENOUS
  Filled 2023-06-02: qty 2

## 2023-06-02 MED ORDER — ONDANSETRON HCL 4 MG/2ML IJ SOLN: 4.0000 mg | Freq: Four times a day (QID) | INTRAMUSCULAR | Status: DC | PRN

## 2023-06-02 MED ORDER — FENTANYL CITRATE PF 50 MCG/ML IJ SOSY
50.0000 ug | PREFILLED_SYRINGE | Freq: Once | INTRAMUSCULAR | Status: AC
  Administered 2023-06-02: 50 ug via INTRAVENOUS
  Filled 2023-06-02: qty 1

## 2023-06-02 MED ORDER — PROPOFOL 10 MG/ML IV BOLUS: 0.5000 mg/kg | Freq: Once | INTRAVENOUS | Status: AC

## 2023-06-02 MED ORDER — MORPHINE SULFATE (PF) 4 MG/ML IV SOLN
4.0000 mg | Freq: Once | INTRAVENOUS | Status: AC
  Administered 2023-06-02: 4 mg via INTRAVENOUS
  Filled 2023-06-02: qty 1

## 2023-06-02 MED ORDER — ACETAMINOPHEN 650 MG RE SUPP: 650.0000 mg | Freq: Four times a day (QID) | RECTAL | Status: DC | PRN

## 2023-06-02 MED ORDER — ACETAMINOPHEN 325 MG PO TABS
650.0000 mg | ORAL_TABLET | Freq: Four times a day (QID) | ORAL | Status: DC | PRN
  Administered 2023-06-03: 650 mg via ORAL
  Filled 2023-06-02: qty 2

## 2023-06-02 MED ORDER — HYDROMORPHONE HCL 1 MG/ML IJ SOLN
1.0000 mg | Freq: Once | INTRAMUSCULAR | Status: AC
  Administered 2023-06-02: 1 mg via INTRAVENOUS
  Filled 2023-06-02: qty 1

## 2023-06-02 NOTE — Sedation Documentation (Addendum)
Patient A&O x4, able to follow verbal commands

## 2023-06-02 NOTE — Discharge Instructions (Addendum)
You were seen in the ER for ankle pain. Your imaging unfortunately did show a break in the right ankle. CT imaging was ordered to confirm this and by request by orthopedic surgeon as this will help facilitate treatment planning for you. Please try to keep leg elevated to reduce swelling. I have sent a prescription for pain medication to your home pharmacy to take as needed for severe pain. For milder pain

## 2023-06-02 NOTE — Progress Notes (Signed)
RT present for conscious sedation.  When medication was first administered, patient's sats did drop to the high 80s and patient was increased to 6L [place] and had blow-by from ambu bag present with improvement in sats to 100%.  No further complications after.  Sats and vitals are currently stable at this time.

## 2023-06-02 NOTE — H&P (Signed)
History and Physical      [name] ZOX:[id number] DOB: December 26, 1955 DOA: 06/02/2023; DOS: 06/02/2023  PCP: Lucky Cowboy, MD  Patient coming from: home   I have personally briefly reviewed patient's old medical records in [facility]  Chief Complaint: Acute left ankle discomfort  HPI: Haley White is a 67 y.o. female with with no reported significant past medical history, who is admitted to [facility] on 06/02/2023 with acute left trimalleolar ankle fracture after presenting from home to [facility] ED complaining of acute left ankle discomfort.   The patient reports tripping while ambulating at home, resulting in a ground-level fall in which the left ankle was the principal point of contact with the floor below.  As result of this fall, the patient reports immediate development of sharp nonradiating left ankle discomfort.  She states that the pains been constant since onset with exacerbation with attempting to move the left lower extremity.   As a consequence of the associated intensity of this discomfort, reports that she is unable to bear weight on the left lower extremity at this time.  This is relative to baseline functional status in which the patient lives independently as well as baseline ambulatory status in which the patient reports the ability to ambulate without assistance, including no need for support devices.  Otherwise, denies any acute arthralgias or myalgias as a result of the above fall.  Denies any acute numbness or paresthesias in bilateral lower extremities.    Did not hit head as a component of this fall, and denies any associated loss of consciousness.  Denies any preceding or associated chest pain, shortness of breath, diaphoresis, palpitations, nausea, vomiting, dizziness, presyncope, or syncope.  Denies any subsequent headache, neck pain, blurry vision, or diplopia.  Not on any blood thinners as an outpatient, including no aspirin.      ED Course:  Vital signs  in the ED were notable for the following: Afebrile; heart rates in the 80s to low 100s; systolic blood pressures in the 120s to 140s; respiratory rate 15-23, oxygen saturation 95 to 100% on room air.  Labs were notable for the following: No labs performed in the ED today.  Per my interpretation, EKG in ED demonstrated the following: No EKG performed in the ED today  Imaging in the ED, per corresponding formal radiology read, was notable for the following: Initial plain films of the left foot, left ankle, left tib/fib showed acute commuted fracture involving the distal fibular diaphysis with associated evidence of lateral angulation of the distal fibular component and to proximately 1.2 cm of lateral displacement of the distal fibular component relative to the proximal fibular component.  These initial plan was also showed with lateral dislocation of the talus as well as the distal fibula fracture relative to the tibia and proximal fibular fracture.  Additionally, this plain film showed acute curvilinear fracture of the distal medial aspect of the tibia with up to 2 mm of cortical step-off as well as possible mild anterior subluxation of the talus relative to the tibia.  Subsequently, while in the ED, the patient underwent manual reduction under conscious sedation with propofol 100 mg IV x 1 dose.   Ensuing postreduction plain films of the left ankle showed improved alignment of the previously noted community trimalleolar ankle fracture with residual deformity at the fracture sites.  Additionally, the patient subsequent underwent CT of the left ankle without contrast showing acute trimalleolar fracture with subluxation as well as additional small acute avulsion  fracture of the anterior lateral tibial plafond.   EDP discussed patient's case with on-call [facility], Dr. Duwayne Heck, who after reviewing case and pre and postreduction imaging, and conveyed that there is not an indication for emergent or  urgent surgical intervention at this time.  Rather, Dr. Aundria Rud recommended conservative measures, including pain control, weightbearing as tolerated and outpatient follow-up in orthopedic surgery clinic in 1 week, specifically with Dr. Odis Hollingshead of [facility].  While in the ED, the following were administered: In addition to aforementioned 100 mg of IV propofol, the patient was received fentanyl 50 mcg IV x 1, Dilaudid 1.0 mg IV x 1, morphine 4 mg IV x 2 doses.  Subsequently, the patient was admitted for overnight observation for pain control relating to acute left trimalleolar fracture in the setting of ground-level mechanical fall.     Review of Systems: As per HPI otherwise 10 point review of systems negative.   History reviewed. No pertinent past medical history.  History reviewed. No pertinent surgical history.  Social History:  reports that she has never smoked. She has never used smokeless tobacco. She reports that she does not currently use alcohol. She reports that she does not use drugs.   No Known Allergies  History reviewed. No pertinent family history.  Family history reviewed and not pertinent    Outpatient medications: The patient conveys that she is on no prescription or over-the-counter medications as an outpatient.   Objective    Physical Exam: Vitals:   06/02/23 1700 06/02/23 1715 06/02/23 1730 06/02/23 1745  BP: 122/69 119/73 122/62 (!) 129/90  Pulse: 92 92 94 97  Resp: (!) 23 (!) 23 18   Temp:      TempSrc:      SpO2: 95% 93% 92% 95%  Weight:        General: appears to be stated age; alert, oriented Skin: warm, dry, no rash Head:  AT/[place] Mouth:  Oral mucosa membranes appear moist, normal dentition Neck: supple; trachea midline Heart:  RRR; did not appreciate any M/R/G Lungs: CTAB, did not appreciate any wheezes, rales, or rhonchi Abdomen: + BS; soft, ND, NT Vascular: 2+ pedal pulses b/l; 2+ radial pulses b/l Extremities: Left ankle splint  noted no muscle wasting Neuro: sensation intact in upper and lower extremities b/l; evaluation of strength relating to the left lower extremity was deferred in the context of presenting acute left trimalleolar ankle fracture.    Labs on Admission: I have personally reviewed following labs and imaging studies  CBC: No results for input(s): "WBC", "NEUTROABS", "HGB", "HCT", "MCV", "PLT" in the last 168 hours. Basic Metabolic Panel: No results for input(s): "NA", "K", "CL", "CO2", "GLUCOSE", "BUN", "CREATININE", "CALCIUM", "MG", "PHOS" in the last 168 hours. GFR: CrCl cannot be calculated (No successful lab value found.). Liver Function Tests: No results for input(s): "AST", "ALT", "ALKPHOS", "BILITOT", "PROT", "ALBUMIN" in the last 168 hours. No results for input(s): "LIPASE", "AMYLASE" in the last 168 hours. No results for input(s): "AMMONIA" in the last 168 hours. Coagulation Profile: No results for input(s): "INR", "PROTIME" in the last 168 hours. Cardiac Enzymes: No results for input(s): "CKTOTAL", "CKMB", "CKMBINDEX", "TROPONINI" in the last 168 hours. BNP (last 3 results) No results for input(s): "PROBNP" in the last 8760 hours. HbA1C: No results for input(s): "HGBA1C" in the last 72 hours. CBG: No results for input(s): "GLUCAP" in the last 168 hours. Lipid Profile: No results for input(s): "CHOL", "HDL", "LDLCALC", "TRIG", "CHOLHDL", "LDLDIRECT" in the last 72 hours. Thyroid Function  Tests: No results for input(s): "TSH", "T4TOTAL", "FREET4", "T3FREE", "THYROIDAB" in the last 72 hours. Anemia Panel: No results for input(s): "VITAMINB12", "FOLATE", "FERRITIN", "TIBC", "IRON", "RETICCTPCT" in the last 72 hours. Urine analysis: No results found for: "COLORURINE", "APPEARANCEUR", "LABSPEC", "PHURINE", "GLUCOSEU", "HGBUR", "BILIRUBINUR", "KETONESUR", "PROTEINUR", "UROBILINOGEN", "NITRITE", "LEUKOCYTESUR"  Radiological Exams on Admission: CT Ankle Left Wo Contrast  Result Date:  06/02/2023 CLINICAL DATA:  Left ankle fracture. EXAM: CT OF THE LEFT ANKLE WITHOUT CONTRAST TECHNIQUE: Multidetector CT imaging of the left ankle was performed according to the standard protocol. Multiplanar CT image reconstructions were also generated. RADIATION DOSE REDUCTION: This exam was performed according to the departmental dose-optimization program which includes automated exposure control, adjustment of the mA and/or kV according to patient size and/or use of iterative reconstruction technique. COMPARISON:  Left ankle x-rays from same day. FINDINGS: Bones/Joint/Cartilage Acute comminuted fracture involving the base of the medial malleolus with 9 mm medial displacement. Acute oblique fracture of the distal fibular metadiaphysis with 4 mm lateral displacement and mild overriding. Acute minimally displaced longitudinal fracture of the posterior malleolus. Small acute avulsion fracture of the anterolateral tibial plafond. Lateral subluxation of the talus with respect to the tibial plafond. Midfoot degenerative changes. No joint effusion. Ligaments Ligaments are suboptimally evaluated by CT. Muscles and Tendons Grossly intact. Prominent thickening of the peroneal longus and distal Achilles tendons, likely reflecting tendinosis. Soft tissue Diffuse soft tissue swelling. No fluid collection or hematoma. No soft tissue mass. IMPRESSION: 1. Acute trimalleolar fracture-subluxation as described above. 2. Additional small acute avulsion fracture of the anterolateral tibial plafond. Electronically Signed   By: Obie Dredge M.D.   On: 06/02/2023 17:05   DG Ankle 2 Views Left  Result Date: 06/02/2023 CLINICAL DATA:  Post splinting EXAM: LEFT ANKLE - 2 VIEW COMPARISON:  Left tib-fib radiographs-earlier same day FINDINGS: Fine bony detail is degraded secondary to overlying casting material Improved alignment of previously noted comminuted trimalleolar ankle fracture with residual deformity at the fracture sites. No  new fracture or dislocation. No radiopaque foreign body. IMPRESSION: Improved alignment of previously noted comminuted trimalleolar ankle fracture with residual deformity at the fracture sites. Electronically Signed   By: Simonne Come M.D.   On: 06/02/2023 14:45   DG Tibia/Fibula Left  Result Date: 06/02/2023 CLINICAL DATA:  Fall.  Pain. EXAM: LEFT ANKLE - 2 VIEW; LEFT TIBIA AND FIBULA - 2 VIEW; LEFT FOOT - 2 VIEW COMPARISON:  Left foot radiographs 04/10/2023, left ankle radiographs 10/17/2021, MRI left ankle 09/08/2021 FINDINGS: Left ankle: There is an acute, comminuted fracture of the distal fibular diaphysis with lateral angulation of the distal fibular component and up to approximately 1.2 cm lateral displacement of the distal fibular component with respect of the proximal fibular component. Approx There is associated lateral dislocation of the talus and distal fibula with respect to the tibia and proximal fibula. There is a curvilinear fracture extending through to portions of the distal medial tibial metaphyseal through the epiphyseal cortex with up to 2 mm diastasis/cortical step-off. Possible mild anterior subluxation of the talus with respect of the tibia on lateral view. -- Left foot: Mild hallux valgus. Moderate dorsal talonavicular, naviculocuneiform, and tarsometatarsal degenerative osteophytosis with moderate joint space narrowing. Left tibia and fibula: No additional more proximal tibia or fibula acute fracture is seen. 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 IMPRESSION: 1. Acute, comminuted fracture of the distal fibular diaphysis with lateral angulation of the distal fibular component and up to approximately 1.2 cm lateral  displacement of the distal fibular component with respect of the proximal fibular component. 2. Lateral dislocation of the talus and the distal fibula fracture component with respect to the tibia and proximal fibular fracture component. 3.  Acute, curvilinear fracture at the distal medial aspect of the tibia with up to 2 mm diastasis/cortical step-off. 4. Possible mild anterior subluxation of the talus with respect of the tibia on lateral view. 5. Moderate midfoot osteoarthritis. Electronically Signed   By: Neita Garnet M.D.   On: 06/02/2023 13:14   DG Ankle 2 Views Left  Result Date: 06/02/2023 CLINICAL DATA:  Fall.  Pain. EXAM: LEFT ANKLE - 2 VIEW; LEFT TIBIA AND FIBULA - 2 VIEW; LEFT FOOT - 2 VIEW COMPARISON:  Left foot radiographs 04/10/2023, left ankle radiographs 10/17/2021, MRI left ankle 09/08/2021 FINDINGS: Left ankle: There is an acute, comminuted fracture of the distal fibular diaphysis with lateral angulation of the distal fibular component and up to approximately 1.2 cm lateral displacement of the distal fibular component with respect of the proximal fibular component. Approx There is associated lateral dislocation of the talus and distal fibula with respect to the tibia and proximal fibula. There is a curvilinear fracture extending through to portions of the distal medial tibial metaphyseal through the epiphyseal cortex with up to 2 mm diastasis/cortical step-off. Possible mild anterior subluxation of the talus with respect of the tibia on lateral view. -- Left foot: Mild hallux valgus. Moderate dorsal talonavicular, naviculocuneiform, and tarsometatarsal degenerative osteophytosis with moderate joint space narrowing. Left tibia and fibula: No additional more proximal tibia or fibula acute fracture is seen. 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 IMPRESSION: 1. Acute, comminuted fracture of the distal fibular diaphysis with lateral angulation of the distal fibular component and up to approximately 1.2 cm lateral displacement of the distal fibular component with respect of the proximal fibular component. 2. Lateral dislocation of the talus and the distal fibula fracture component with respect to the  tibia and proximal fibular fracture component. 3. Acute, curvilinear fracture at the distal medial aspect of the tibia with up to 2 mm diastasis/cortical step-off. 4. Possible mild anterior subluxation of the talus with respect of the tibia on lateral view. 5. Moderate midfoot osteoarthritis. Electronically Signed   By: Neita Garnet M.D.   On: 06/02/2023 13:14   DG Foot 2 Views Left  Result Date: 06/02/2023 CLINICAL DATA:  Fall.  Pain. EXAM: LEFT ANKLE - 2 VIEW; LEFT TIBIA AND FIBULA - 2 VIEW; LEFT FOOT - 2 VIEW COMPARISON:  Left foot radiographs 04/10/2023, left ankle radiographs 10/17/2021, MRI left ankle 09/08/2021 FINDINGS: Left ankle: There is an acute, comminuted fracture of the distal fibular diaphysis with lateral angulation of the distal fibular component and up to approximately 1.2 cm lateral displacement of the distal fibular component with respect of the proximal fibular component. Approx There is associated lateral dislocation of the talus and distal fibula with respect to the tibia and proximal fibula. There is a curvilinear fracture extending through to portions of the distal medial tibial metaphyseal through the epiphyseal cortex with up to 2 mm diastasis/cortical step-off. Possible mild anterior subluxation of the talus with respect of the tibia on lateral view. -- Left foot: Mild hallux valgus. Moderate dorsal talonavicular, naviculocuneiform, and tarsometatarsal degenerative osteophytosis with moderate joint space narrowing. Left tibia and fibula: No additional more proximal tibia or fibula acute fracture is seen. 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 IMPRESSION: 1. Acute, comminuted fracture  of the distal fibular diaphysis with lateral angulation of the distal fibular component and up to approximately 1.2 cm lateral displacement of the distal fibular component with respect of the proximal fibular component. 2. Lateral dislocation of the talus and the  distal fibula fracture component with respect to the tibia and proximal fibular fracture component. 3. Acute, curvilinear fracture at the distal medial aspect of the tibia with up to 2 mm diastasis/cortical step-off. 4. Possible mild anterior subluxation of the talus with respect of the tibia on lateral view. 5. Moderate midfoot osteoarthritis. Electronically Signed   By: Neita Garnet M.D.   On: 06/02/2023 13:14      Assessment/Plan   Principal Problem:   Trimalleolar fracture of left ankle Active Problems:   Fall at home, initial encounter   Acute left ankle pain      #) Acute left trimalleolar ankle fracture: confirmed via presenting plain films and for which the patient underwent manual reduction under conscious sedation in the ED, with ensuing postreduction plain films showing improved alignment, as further detailed above, with ensuing CT left ankle also showing acute trimalleolar fracture with subluxation injury, as further detailed above.   This is stemming from ground level mechanical fall without associated loss of consciousness that occurred earlier on the day of admission, as further described above, resulting in immediate develop of acute left ankle pain.  At this time, the left lower extremity appears neurovascularly intact, although pain control continues to be suboptimal, prompting admission to the hospital this evening for further optimization of pain control. Not on any blood thinners at home, including no aspirin.   EDP discussed patient's case with on-call [facility], Dr. Duwayne Heck, who after reviewing case and pre and postreduction imaging, and conveyed that there is not an indication for emergent or urgent surgical intervention at this time.  Rather, Dr. Aundria Rud recommended conservative measures, including pain control, weightbearing as tolerated and outpatient follow-up in orthopedic surgery clinic in 1 week, specifically with Dr. Odis Holl[facility]head of [facility].   Plan: Per  orthopedic surgery, they recommend follow-up in [facility] clinic in 1 week.  Weightbearing as tolerated in the interval.  Prn IV Dilaudid.  Fall precautions.  PT/OT consults.  In terms of DVT prophylaxis, will limit SCDs to the right lower extremity.  Check INR. Check 25-hydroxy vit D level to assess for any underlying pathological contribution towards the patient's fracture stemming from associated vitamin deficiency.  Incentive spirometry. Will also check CMP at this time to evaluate renal function, to assess for potential concomitant use of NSAIDs for pain control.  Prn acetaminophen.              #) Ground level mechanical fall: The patient reports a ground level mechanical fall earlier today in which she tripped without any associated loss of consciousness. Appears to be purely mechanical in nature, without clinical evidence at this time to suggest contributory dizziness, presyncope, syncope, or acute ischemic CVA. Does not appear to have hit head as component of this fall.  presentation does not appear to be associated any acute neurologic deficits.  While this fall appears to be purely mechanical in nature, will also check urinalysis to evaluate for any underlying infectious contribution.   Plan: Check urinalysis, as above.  CMP and CBC with differential in the morning. Fall precautions.  PT/OT consults, as above.      DVT prophylaxis: SCD on RLE only  Code Status: Full code Family Communication: none Disposition Plan: Per Rounding Team Consults called: EDP  discussed patient's case with on-call [facility], Dr. Duwayne Heck, as further detailed above;  Admission status: obs     I SPENT GREATER THAN 75  MINUTES IN CLINICAL CARE TIME/MEDICAL DECISION-MAKING IN COMPLETING THIS ADMISSION.      Chaney Born Alva Kuenzel DO [facility]  From 7PM - 7AM   06/02/2023, 7:53 PM

## 2023-06-02 NOTE — Progress Notes (Deleted)
History and Physical      Haley White ZOX:[id number] DOB: December 26, 1955 DOA: 06/02/2023; DOS: 06/02/2023  PCP: Lucky Cowboy, MD  Patient coming from: home   I have personally briefly reviewed patient's old medical records in [facility]  Chief Complaint: Acute left ankle discomfort  HPI: Haley White is a 67 y.o. female with with no reported significant past medical history, who is admitted to [facility] on 06/02/2023 with acute left trimalleolar ankle fracture after presenting from home to [facility] ED complaining of acute left ankle discomfort.   The patient reports tripping while ambulating at home, resulting in a ground-level fall in which the left ankle was the principal point of contact with the floor below.  As result of this fall, the patient reports immediate development of sharp nonradiating left ankle discomfort.  She states that the pains been constant since onset with exacerbation with attempting to move the left lower extremity.   As a consequence of the associated intensity of this discomfort, reports that she is unable to bear weight on the left lower extremity at this time.  This is relative to baseline functional status in which the patient lives independently as well as baseline ambulatory status in which the patient reports the ability to ambulate without assistance, including no need for support devices.  Otherwise, denies any acute arthralgias or myalgias as a result of the above fall.  Denies any acute numbness or paresthesias in bilateral lower extremities.    Did not hit head as a component of this fall, and denies any associated loss of consciousness.  Denies any preceding or associated chest pain, shortness of breath, diaphoresis, palpitations, nausea, vomiting, dizziness, presyncope, or syncope.  Denies any subsequent headache, neck pain, blurry vision, or diplopia.  Not on any blood thinners as an outpatient, including no aspirin.      ED Course:  Vital signs  in the ED were notable for the following: Afebrile; heart rates in the 80s to low 100s; systolic blood pressures in the 120s to 140s; respiratory rate 15-23, oxygen saturation 95 to 100% on room air.  Labs were notable for the following: No labs performed in the ED today.  Per my interpretation, EKG in ED demonstrated the following: No EKG performed in the ED today  Imaging in the ED, per corresponding formal radiology read, was notable for the following: Initial plain films of the left foot, left ankle, left tib/fib showed acute commuted fracture involving the distal fibular diaphysis with associated evidence of lateral angulation of the distal fibular component and to proximately 1.2 cm of lateral displacement of the distal fibular component relative to the proximal fibular component.  These initial plan was also showed with lateral dislocation of the talus as well as the distal fibula fracture relative to the tibia and proximal fibular fracture.  Additionally, this plain film showed acute curvilinear fracture of the distal medial aspect of the tibia with up to 2 mm of cortical step-off as well as possible mild anterior subluxation of the talus relative to the tibia.  Subsequently, while in the ED, the patient underwent manual reduction under conscious sedation with propofol 100 mg IV x 1 dose.   Ensuing postreduction plain films of the left ankle showed improved alignment of the previously noted community trimalleolar ankle fracture with residual deformity at the fracture sites.  Additionally, the patient subsequent underwent CT of the left ankle without contrast showing acute trimalleolar fracture with subluxation as well as additional small acute avulsion  fracture of the anterior lateral tibial plafond.   EDP discussed patient's case with on-call [facility], Dr. Duwayne Heck, who after reviewing case and pre and postreduction imaging, and conveyed that there is not an indication for emergent or  urgent surgical intervention at this time.  Rather, Dr. Aundria Rud recommended conservative measures, including pain control, weightbearing as tolerated and outpatient follow-up in orthopedic surgery clinic in 1 week, specifically with Dr. Odis Hollingshead of [facility].  While in the ED, the following were administered: In addition to aforementioned 100 mg of IV propofol, the patient was received fentanyl 50 mcg IV x 1, Dilaudid 1.0 mg IV x 1, morphine 4 mg IV x 2 doses.  Subsequently, the patient was admitted for overnight observation for pain control relating to acute left trimalleolar fracture in the setting of ground-level mechanical fall.     Review of Systems: As per HPI otherwise 10 point review of systems negative.   History reviewed. No pertinent past medical history.  History reviewed. No pertinent surgical history.  Social History:  reports that she has never smoked. She has never used smokeless tobacco. She reports that she does not currently use alcohol. She reports that she does not use drugs.   No Known Allergies  History reviewed. No pertinent family history.  Family history reviewed and not pertinent    Outpatient medications: The patient conveys that she is on no prescription or over-the-counter medications as an outpatient.   Objective    Physical Exam: Vitals:   06/02/23 1700 06/02/23 1715 06/02/23 1730 06/02/23 1745  BP: 122/69 119/73 122/62 (!) 129/90  Pulse: 92 92 94 97  Resp: (!) 23 (!) 23 18   Temp:      TempSrc:      SpO2: 95% 93% 92% 95%  Weight:        General: appears to be stated age; alert, oriented Skin: warm, dry, no rash Head:  AT/[place] Mouth:  Oral mucosa membranes appear moist, normal dentition Neck: supple; trachea midline Heart:  RRR; did not appreciate any M/R/G Lungs: CTAB, did not appreciate any wheezes, rales, or rhonchi Abdomen: + BS; soft, ND, NT Vascular: 2+ pedal pulses b/l; 2+ radial pulses b/l Extremities: Left ankle splint  noted no muscle wasting Neuro: sensation intact in upper and lower extremities b/l; evaluation of strength relating to the left lower extremity was deferred in the context of presenting acute left trimalleolar ankle fracture.    Labs on Admission: I have personally reviewed following labs and imaging studies  CBC: No results for input(s): "WBC", "NEUTROABS", "HGB", "HCT", "MCV", "PLT" in the last 168 hours. Basic Metabolic Panel: No results for input(s): "NA", "K", "CL", "CO2", "GLUCOSE", "BUN", "CREATININE", "CALCIUM", "MG", "PHOS" in the last 168 hours. GFR: CrCl cannot be calculated (No successful lab value found.). Liver Function Tests: No results for input(s): "AST", "ALT", "ALKPHOS", "BILITOT", "PROT", "ALBUMIN" in the last 168 hours. No results for input(s): "LIPASE", "AMYLASE" in the last 168 hours. No results for input(s): "AMMONIA" in the last 168 hours. Coagulation Profile: No results for input(s): "INR", "PROTIME" in the last 168 hours. Cardiac Enzymes: No results for input(s): "CKTOTAL", "CKMB", "CKMBINDEX", "TROPONINI" in the last 168 hours. BNP (last 3 results) No results for input(s): "PROBNP" in the last 8760 hours. HbA1C: No results for input(s): "HGBA1C" in the last 72 hours. CBG: No results for input(s): "GLUCAP" in the last 168 hours. Lipid Profile: No results for input(s): "CHOL", "HDL", "LDLCALC", "TRIG", "CHOLHDL", "LDLDIRECT" in the last 72 hours. Thyroid Function  Tests: No results for input(s): "TSH", "T4TOTAL", "FREET4", "T3FREE", "THYROIDAB" in the last 72 hours. Anemia Panel: No results for input(s): "VITAMINB12", "FOLATE", "FERRITIN", "TIBC", "IRON", "RETICCTPCT" in the last 72 hours. Urine analysis: No results found for: "COLORURINE", "APPEARANCEUR", "LABSPEC", "PHURINE", "GLUCOSEU", "HGBUR", "BILIRUBINUR", "KETONESUR", "PROTEINUR", "UROBILINOGEN", "NITRITE", "LEUKOCYTESUR"  Radiological Exams on Admission: CT Ankle Left Wo Contrast  Result Date:  06/02/2023 CLINICAL DATA:  Left ankle fracture. EXAM: CT OF THE LEFT ANKLE WITHOUT CONTRAST TECHNIQUE: Multidetector CT imaging of the left ankle was performed according to the standard protocol. Multiplanar CT image reconstructions were also generated. RADIATION DOSE REDUCTION: This exam was performed according to the departmental dose-optimization program which includes automated exposure control, adjustment of the mA and/or kV according to patient size and/or use of iterative reconstruction technique. COMPARISON:  Left ankle x-rays from same day. FINDINGS: Bones/Joint/Cartilage Acute comminuted fracture involving the base of the medial malleolus with 9 mm medial displacement. Acute oblique fracture of the distal fibular metadiaphysis with 4 mm lateral displacement and mild overriding. Acute minimally displaced longitudinal fracture of the posterior malleolus. Small acute avulsion fracture of the anterolateral tibial plafond. Lateral subluxation of the talus with respect to the tibial plafond. Midfoot degenerative changes. No joint effusion. Ligaments Ligaments are suboptimally evaluated by CT. Muscles and Tendons Grossly intact. Prominent thickening of the peroneal longus and distal Achilles tendons, likely reflecting tendinosis. Soft tissue Diffuse soft tissue swelling. No fluid collection or hematoma. No soft tissue mass. IMPRESSION: 1. Acute trimalleolar fracture-subluxation as described above. 2. Additional small acute avulsion fracture of the anterolateral tibial plafond. Electronically Signed   By: Obie Dredge M.D.   On: 06/02/2023 17:05   DG Ankle 2 Views Left  Result Date: 06/02/2023 CLINICAL DATA:  Post splinting EXAM: LEFT ANKLE - 2 VIEW COMPARISON:  Left tib-fib radiographs-earlier same day FINDINGS: Fine bony detail is degraded secondary to overlying casting material Improved alignment of previously noted comminuted trimalleolar ankle fracture with residual deformity at the fracture sites. No  new fracture or dislocation. No radiopaque foreign body. IMPRESSION: Improved alignment of previously noted comminuted trimalleolar ankle fracture with residual deformity at the fracture sites. Electronically Signed   By: Simonne Come M.D.   On: 06/02/2023 14:45   DG Tibia/Fibula Left  Result Date: 06/02/2023 CLINICAL DATA:  Fall.  Pain. EXAM: LEFT ANKLE - 2 VIEW; LEFT TIBIA AND FIBULA - 2 VIEW; LEFT FOOT - 2 VIEW COMPARISON:  Left foot radiographs 04/10/2023, left ankle radiographs 10/17/2021, MRI left ankle 09/08/2021 FINDINGS: Left ankle: There is an acute, comminuted fracture of the distal fibular diaphysis with lateral angulation of the distal fibular component and up to approximately 1.2 cm lateral displacement of the distal fibular component with respect of the proximal fibular component. Approx There is associated lateral dislocation of the talus and distal fibula with respect to the tibia and proximal fibula. There is a curvilinear fracture extending through to portions of the distal medial tibial metaphyseal through the epiphyseal cortex with up to 2 mm diastasis/cortical step-off. Possible mild anterior subluxation of the talus with respect of the tibia on lateral view. -- Left foot: Mild hallux valgus. Moderate dorsal talonavicular, naviculocuneiform, and tarsometatarsal degenerative osteophytosis with moderate joint space narrowing. Left tibia and fibula: No additional more proximal tibia or fibula acute fracture is seen. 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 IMPRESSION: 1. Acute, comminuted fracture of the distal fibular diaphysis with lateral angulation of the distal fibular component and up to approximately 1.2 cm lateral  displacement of the distal fibular component with respect of the proximal fibular component. 2. Lateral dislocation of the talus and the distal fibula fracture component with respect to the tibia and proximal fibular fracture component. 3.  Acute, curvilinear fracture at the distal medial aspect of the tibia with up to 2 mm diastasis/cortical step-off. 4. Possible mild anterior subluxation of the talus with respect of the tibia on lateral view. 5. Moderate midfoot osteoarthritis. Electronically Signed   By: Neita Garnet M.D.   On: 06/02/2023 13:14   DG Ankle 2 Views Left  Result Date: 06/02/2023 CLINICAL DATA:  Fall.  Pain. EXAM: LEFT ANKLE - 2 VIEW; LEFT TIBIA AND FIBULA - 2 VIEW; LEFT FOOT - 2 VIEW COMPARISON:  Left foot radiographs 04/10/2023, left ankle radiographs 10/17/2021, MRI left ankle 09/08/2021 FINDINGS: Left ankle: There is an acute, comminuted fracture of the distal fibular diaphysis with lateral angulation of the distal fibular component and up to approximately 1.2 cm lateral displacement of the distal fibular component with respect of the proximal fibular component. Approx There is associated lateral dislocation of the talus and distal fibula with respect to the tibia and proximal fibula. There is a curvilinear fracture extending through to portions of the distal medial tibial metaphyseal through the epiphyseal cortex with up to 2 mm diastasis/cortical step-off. Possible mild anterior subluxation of the talus with respect of the tibia on lateral view. -- Left foot: Mild hallux valgus. Moderate dorsal talonavicular, naviculocuneiform, and tarsometatarsal degenerative osteophytosis with moderate joint space narrowing. Left tibia and fibula: No additional more proximal tibia or fibula acute fracture is seen. 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 IMPRESSION: 1. Acute, comminuted fracture of the distal fibular diaphysis with lateral angulation of the distal fibular component and up to approximately 1.2 cm lateral displacement of the distal fibular component with respect of the proximal fibular component. 2. Lateral dislocation of the talus and the distal fibula fracture component with respect to the  tibia and proximal fibular fracture component. 3. Acute, curvilinear fracture at the distal medial aspect of the tibia with up to 2 mm diastasis/cortical step-off. 4. Possible mild anterior subluxation of the talus with respect of the tibia on lateral view. 5. Moderate midfoot osteoarthritis. Electronically Signed   By: Neita Garnet M.D.   On: 06/02/2023 13:14   DG Foot 2 Views Left  Result Date: 06/02/2023 CLINICAL DATA:  Fall.  Pain. EXAM: LEFT ANKLE - 2 VIEW; LEFT TIBIA AND FIBULA - 2 VIEW; LEFT FOOT - 2 VIEW COMPARISON:  Left foot radiographs 04/10/2023, left ankle radiographs 10/17/2021, MRI left ankle 09/08/2021 FINDINGS: Left ankle: There is an acute, comminuted fracture of the distal fibular diaphysis with lateral angulation of the distal fibular component and up to approximately 1.2 cm lateral displacement of the distal fibular component with respect of the proximal fibular component. Approx There is associated lateral dislocation of the talus and distal fibula with respect to the tibia and proximal fibula. There is a curvilinear fracture extending through to portions of the distal medial tibial metaphyseal through the epiphyseal cortex with up to 2 mm diastasis/cortical step-off. Possible mild anterior subluxation of the talus with respect of the tibia on lateral view. -- Left foot: Mild hallux valgus. Moderate dorsal talonavicular, naviculocuneiform, and tarsometatarsal degenerative osteophytosis with moderate joint space narrowing. Left tibia and fibula: No additional more proximal tibia or fibula acute fracture is seen. 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 IMPRESSION: 1. Acute, comminuted fracture  of the distal fibular diaphysis with lateral angulation of the distal fibular component and up to approximately 1.2 cm lateral displacement of the distal fibular component with respect of the proximal fibular component. 2. Lateral dislocation of the talus and the  distal fibula fracture component with respect to the tibia and proximal fibular fracture component. 3. Acute, curvilinear fracture at the distal medial aspect of the tibia with up to 2 mm diastasis/cortical step-off. 4. Possible mild anterior subluxation of the talus with respect of the tibia on lateral view. 5. Moderate midfoot osteoarthritis. Electronically Signed   By: Neita Garnet M.D.   On: 06/02/2023 13:14      Assessment/Plan   Principal Problem:   Trimalleolar fracture of left ankle Active Problems:   Fall at home, initial encounter   Acute left ankle pain      #) Acute left trimalleolar ankle fracture: confirmed via presenting plain films and for which the patient underwent manual reduction under conscious sedation in the ED, with ensuing postreduction plain films showing improved alignment, as further detailed above, with ensuing CT left ankle also showing acute trimalleolar fracture with subluxation injury, as further detailed above.   This is stemming from ground level mechanical fall without associated loss of consciousness that occurred earlier on the day of admission, as further described above, resulting in immediate develop of acute left ankle pain.  At this time, the left lower extremity appears neurovascularly intact, although pain control continues to be suboptimal, prompting admission to the hospital this evening for further optimization of pain control. Not on any blood thinners at home, including no aspirin.   EDP discussed patient's case with on-call [facility], Dr. Duwayne Heck, who after reviewing case and pre and postreduction imaging, and conveyed that there is not an indication for emergent or urgent surgical intervention at this time.  Rather, Dr. Aundria Rud recommended conservative measures, including pain control, weightbearing as tolerated and outpatient follow-up in orthopedic surgery clinic in 1 week, specifically with Dr. Odis Holl[facility]head of [facility].   Plan: Per  orthopedic surgery, they recommend follow-up in [facility] clinic in 1 week.  Weightbearing as tolerated in the interval.  Prn IV Dilaudid.  Fall precautions.  PT/OT consults.  In terms of DVT prophylaxis, will limit SCDs to the right lower extremity.  Check INR. Check 25-hydroxy vit D level to assess for any underlying pathological contribution towards the patient's fracture stemming from associated vitamin deficiency.  Incentive spirometry. Will also check CMP at this time to evaluate renal function, to assess for potential concomitant use of NSAIDs for pain control.  Prn acetaminophen.              #) Ground level mechanical fall: The patient reports a ground level mechanical fall earlier today in which she tripped without any associated loss of consciousness. Appears to be purely mechanical in nature, without clinical evidence at this time to suggest contributory dizziness, presyncope, syncope, or acute ischemic CVA. Does not appear to have hit head as component of this fall.  presentation does not appear to be associated any acute neurologic deficits.  While this fall appears to be purely mechanical in nature, will also check urinalysis to evaluate for any underlying infectious contribution.   Plan: Check urinalysis, as above.  CMP and CBC with differential in the morning. Fall precautions.  PT/OT consults, as above.      DVT prophylaxis: SCD on RLE only  Code Status: Full code Family Communication: none Disposition Plan: Per Rounding Team Consults called: EDP  discussed patient's case with on-call [facility], Dr. Duwayne Heck, as further detailed above;  Admission status: obs     I SPENT GREATER THAN 75  MINUTES IN CLINICAL CARE TIME/MEDICAL DECISION-MAKING IN COMPLETING THIS ADMISSION.      Chaney Born Alva Kuenzel DO [facility]  From 7PM - 7AM   06/02/2023, 7:53 PM

## 2023-06-02 NOTE — ED Provider Notes (Signed)
[place] EMERGENCY DEPARTMENT AT [facility] Provider Note   CSN: [id number] Arrival date & time: 06/02/23  1130     History  Chief Complaint  Patient presents with   Ankle Pain    left    Haley White is a 67 y.o. female presented after falling while walking her dog few hours ago and injuring her left ankle.  Patient states that her foot is going opposite direction and that she broke her foot.  Patient is unsure of how she landed. Patient is currently in too much pain to provide history.  ROS unable to be obtained.  Home Medications Prior to Admission medications   Not on File      Allergies    Patient has no allergy information on record.    Review of Systems   Review of Systems See HPI Physical Exam Updated Vital Signs BP 121/63   Pulse 66   Temp 98.1 F (36.7 C) (Oral)   Resp 18   SpO2 96%  Physical Exam Constitutional:      General: She is in acute distress.  Cardiovascular:     Rate and Rhythm: Normal rate.     Pulses: Normal pulses.  Musculoskeletal:     Comments: Left ankle deformity noted without skin color changes, patient nontender to palpation to proximal fibula without abnormalities palpated, patient able to wiggle toes, no other deformities noted besides left ankle, step-off suspected on medial aspect of ankle Soft compartments  Skin:    General: Skin is warm and dry.     Capillary Refill: Capillary refill takes less than 2 seconds.     Comments: No overlying skin color changes  Neurological:     Mental Status: She is alert.     Comments: Sensation intact distally     ED Results / Procedures / Treatments   Labs (all labs ordered are listed, but only abnormal results are displayed) Labs Reviewed - No data to display  EKG None  Radiology No results found.  Procedures Procedures    Medications Ordered in ED Medications  fentaNYL (SUBLIMAZE) injection 50 mcg (50 mcg Intravenous Given 06/02/23 1242)    ED Course/ Medical  Decision Making/ A&P Clinical Course as of 06/02/23 1326  Mon Jun 02, 2023  1304 DG Foot 2 Views Left [DR]    Clinical Course User Index [DR] Haley Grizzle, MD                             Medical Decision Making Amount and/or Complexity of Data Reviewed Radiology: ordered.  Risk Prescription drug management.   Haley White 67 y.o. presented today for left ankle pain. Working DDx that I considered at this time includes, but not limited to, strain/sprain, fracture, dislocation, neurovascular compromise, ischemic limb, compartment syndrome.  R/o DDx: Pending  Review of prior external notes: None  Unique Tests and My Interpretation:  Left ankle x-ray: Pending Left foot x-ray: Pending Left tib-fib: Pending  Discussion with Independent Historian: None  Discussion of Management of Tests: None  Risk: pending  Risk Stratification Score: none  Plan: On exam patient was in distress stable vitals.  There did appear to be a deformity to left ankle however patient was neurovascular intact.  Patient not have any tenderness or tenderness palpating the proximal fibula.  Patient required more fentanyl as the fentanyl she received from EMS had worn off.  Patient was unable to provide history as she was in  distress but was going to x-ray at the time that I saw her.  Patient was signed out to [facility] White, Haley White with anticipation of reduction pending x-rays.  Patient stable at this time.         Final Clinical Impression(s) / ED Diagnoses Final diagnoses:  None    Rx / DC Orders ED Discharge Orders     None         Haley White 06/02/23 1328    Haley Grizzle, MD 06/04/23 1249

## 2023-06-02 NOTE — ED Triage Notes (Signed)
Patient BIB EMS from home, while walking dog slipped & fell in [place]ding [place] area. Deformity left ankle. Fentanyl , 4mg  Zofran. 146/98, 66, 18, 96%RA

## 2023-06-02 NOTE — ED Notes (Signed)
ED TO INPATIENT HANDOFF REPORT  ED Nurse Name and Phone #: [phone]  S Name/Age/Gender Haley White 67 y.o. female Room/Bed: 036C/036C  Code Status   Code Status: Not on file  Home/SNF/Other Rehab Patient oriented to: self, place, time, and situation Is this baseline? Yes   Triage Complete: Triage complete  Chief Complaint fall ankle inj  Triage Note Patient BIB EMS from home, while walking dog slipped & fell in [place] area. Deformity left ankle. Fentanyl , 4mg  Zofran. 146/98, 66, 18, 96%RA   Allergies Not on File  Level of Care/Admitting Diagnosis ED Disposition     ED Disposition  Admit   Condition  --   Comment  The patient appears reasonably stabilized for admission considering the current resources, flow, and capabilities available in the ED at this time, and I doubt any other [facility] requiring further screening and/or treatment in the ED prior to admission is  present.          B Medical/Surgery History No past medical history on file.   A IV Location/Drains/Wounds Patient Lines/Drains/Airways Status     Active Line/Drains/Airways     Name Placement date Placement time Site Days   Peripheral IV 06/02/23 18 G Left Antecubital 06/02/23  1242  Antecubital  less than 1            Intake/Output Last 24 hours No intake or output data in the 24 hours ending 06/02/23 1913  Labs/Imaging No results found for this or any previous visit (from the past 48 hour(s)). CT Ankle Left Wo Contrast  Result Date: 06/02/2023 CLINICAL DATA:  Left ankle fracture. EXAM: CT OF THE LEFT ANKLE WITHOUT CONTRAST TECHNIQUE: Multidetector CT imaging of the left ankle was performed according to the standard protocol. Multiplanar CT image reconstructions were also generated. RADIATION DOSE REDUCTION: This exam was performed according to the departmental dose-optimization program which includes automated exposure control, adjustment of the mA and/or kV according to  patient size and/or use of iterative reconstruction technique. COMPARISON:  Left ankle x-rays from same day. FINDINGS: Bones/Joint/Cartilage Acute comminuted fracture involving the base of the medial malleolus with 9 mm medial displacement. Acute oblique fracture of the distal fibular metadiaphysis with 4 mm lateral displacement and mild overriding. Acute minimally displaced longitudinal fracture of the posterior malleolus. Small acute avulsion fracture of the anterolateral tibial plafond. Lateral subluxation of the talus with respect to the tibial plafond. Midfoot degenerative changes. No joint effusion. Ligaments Ligaments are suboptimally evaluated by CT. Muscles and Tendons Grossly intact. Prominent thickening of the peroneal longus and distal Achilles tendons, likely reflecting tendinosis. Soft tissue Diffuse soft tissue swelling. No fluid collection or hematoma. No soft tissue mass. IMPRESSION: 1. Acute trimalleolar fracture-subluxation as described above. 2. Additional small acute avulsion fracture of the anterolateral tibial plafond. Electronically Signed   By: Obie Dredge M.D.   On: 06/02/2023 17:05   DG Ankle 2 Views Left  Result Date: 06/02/2023 CLINICAL DATA:  Post splinting EXAM: LEFT ANKLE - 2 VIEW COMPARISON:  Left tib-fib radiographs-earlier same day FINDINGS: Fine bony detail is degraded secondary to overlying casting material Improved alignment of previously noted comminuted trimalleolar ankle fracture with residual deformity at the fracture sites. No new fracture or dislocation. No radiopaque foreign body. IMPRESSION: Improved alignment of previously noted comminuted trimalleolar ankle fracture with residual deformity at the fracture sites. Electronically Signed   By: Simonne Come M.D.   On: 06/02/2023 14:45   DG Tibia/Fibula Left  Result Date: 06/02/2023 CLINICAL  DATA:  Fall.  Pain. EXAM: LEFT ANKLE - 2 VIEW; LEFT TIBIA AND FIBULA - 2 VIEW; LEFT FOOT - 2 VIEW COMPARISON:  Left foot  radiographs 04/10/2023, left ankle radiographs 10/17/2021, MRI left ankle 09/08/2021 FINDINGS: Left ankle: There is an acute, comminuted fracture of the distal fibular diaphysis with lateral angulation of the distal fibular component and up to approximately 1.2 cm lateral displacement of the distal fibular component with respect of the proximal fibular component. Approx There is associated lateral dislocation of the talus and distal fibula with respect to the tibia and proximal fibula. There is a curvilinear fracture extending through to portions of the distal medial tibial metaphyseal through the epiphyseal cortex with up to 2 mm diastasis/cortical step-off. Possible mild anterior subluxation of the talus with respect of the tibia on lateral view. -- Left foot: Mild hallux valgus. Moderate dorsal talonavicular, naviculocuneiform, and tarsometatarsal degenerative osteophytosis with moderate joint space narrowing. Left tibia and fibula: No additional more proximal tibia or fibula acute fracture is seen. 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 IMPRESSION: 1. Acute, comminuted fracture of the distal fibular diaphysis with lateral angulation of the distal fibular component and up to approximately 1.2 cm lateral displacement of the distal fibular component with respect of the proximal fibular component. 2. Lateral dislocation of the talus and the distal fibula fracture component with respect to the tibia and proximal fibular fracture component. 3. Acute, curvilinear fracture at the distal medial aspect of the tibia with up to 2 mm diastasis/cortical step-off. 4. Possible mild anterior subluxation of the talus with respect of the tibia on lateral view. 5. Moderate midfoot osteoarthritis. Electronically Signed   By: Neita Garnet M.D.   On: 06/02/2023 13:14   DG Ankle 2 Views Left  Result Date: 06/02/2023 CLINICAL DATA:  Fall.  Pain. EXAM: LEFT ANKLE - 2 VIEW; LEFT TIBIA AND FIBULA -  2 VIEW; LEFT FOOT - 2 VIEW COMPARISON:  Left foot radiographs 04/10/2023, left ankle radiographs 10/17/2021, MRI left ankle 09/08/2021 FINDINGS: Left ankle: There is an acute, comminuted fracture of the distal fibular diaphysis with lateral angulation of the distal fibular component and up to approximately 1.2 cm lateral displacement of the distal fibular component with respect of the proximal fibular component. Approx There is associated lateral dislocation of the talus and distal fibula with respect to the tibia and proximal fibula. There is a curvilinear fracture extending through to portions of the distal medial tibial metaphyseal through the epiphyseal cortex with up to 2 mm diastasis/cortical step-off. Possible mild anterior subluxation of the talus with respect of the tibia on lateral view. -- Left foot: Mild hallux valgus. Moderate dorsal talonavicular, naviculocuneiform, and tarsometatarsal degenerative osteophytosis with moderate joint space narrowing. Left tibia and fibula: No additional more proximal tibia or fibula acute fracture is seen. 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 IMPRESSION: 1. Acute, comminuted fracture of the distal fibular diaphysis with lateral angulation of the distal fibular component and up to approximately 1.2 cm lateral displacement of the distal fibular component with respect of the proximal fibular component. 2. Lateral dislocation of the talus and the distal fibula fracture component with respect to the tibia and proximal fibular fracture component. 3. Acute, curvilinear fracture at the distal medial aspect of the tibia with up to 2 mm diastasis/cortical step-off. 4. Possible mild anterior subluxation of the talus with respect of the tibia on lateral view. 5. Moderate midfoot osteoarthritis. Electronically Signed   By: Windy Fast  Viola M.D.   On: 06/02/2023 13:14   DG Foot 2 Views Left  Result Date: 06/02/2023 CLINICAL DATA:  Fall.  Pain.  EXAM: LEFT ANKLE - 2 VIEW; LEFT TIBIA AND FIBULA - 2 VIEW; LEFT FOOT - 2 VIEW COMPARISON:  Left foot radiographs 04/10/2023, left ankle radiographs 10/17/2021, MRI left ankle 09/08/2021 FINDINGS: Left ankle: There is an acute, comminuted fracture of the distal fibular diaphysis with lateral angulation of the distal fibular component and up to approximately 1.2 cm lateral displacement of the distal fibular component with respect of the proximal fibular component. Approx There is associated lateral dislocation of the talus and distal fibula with respect to the tibia and proximal fibula. There is a curvilinear fracture extending through to portions of the distal medial tibial metaphyseal through the epiphyseal cortex with up to 2 mm diastasis/cortical step-off. Possible mild anterior subluxation of the talus with respect of the tibia on lateral view. -- Left foot: Mild hallux valgus. Moderate dorsal talonavicular, naviculocuneiform, and tarsometatarsal degenerative osteophytosis with moderate joint space narrowing. Left tibia and fibula: No additional more proximal tibia or fibula acute fracture is seen. 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 IMPRESSION: 1. Acute, comminuted fracture of the distal fibular diaphysis with lateral angulation of the distal fibular component and up to approximately 1.2 cm lateral displacement of the distal fibular component with respect of the proximal fibular component. 2. Lateral dislocation of the talus and the distal fibula fracture component with respect to the tibia and proximal fibular fracture component. 3. Acute, curvilinear fracture at the distal medial aspect of the tibia with up to 2 mm diastasis/cortical step-off. 4. Possible mild anterior subluxation of the talus with respect of the tibia on lateral view. 5. Moderate midfoot osteoarthritis. Electronically Signed   By: Neita Garnet M.D.   On: 06/02/2023 13:14    Pending Labs Unresulted  Labs (From admission, onward)    None       Vitals/Pain Today's Vitals   06/02/23 1715 06/02/23 1730 06/02/23 1745 06/02/23 1905  BP: 119/73 122/62 (!) 129/90   Pulse: 92 94 97   Resp: (!) 23 18    Temp:      TempSrc:      SpO2: 93% 92% 95%   Weight:      PainSc:    10-Worst pain ever    Isolation Precautions No active isolations  Medications Medications  fentaNYL (SUBLIMAZE) injection 50 mcg (50 mcg Intravenous Given 06/02/23 1242)  propofol (DIPRIVAN) 10 mg/mL bolus/IV push 40 mg (100 mg Intravenous Given 06/02/23 1347)  morphine (PF) 4 MG/ML injection 4 mg (4 mg Intravenous Given 06/02/23 1502)  morphine (PF) 4 MG/ML injection 4 mg (4 mg Intravenous Given 06/02/23 1644)  HYDROmorphone (DILAUDID) injection 1 mg (1 mg Intravenous Given 06/02/23 1905)    Mobility walks with device     Focused Assessments Left ankle deformity in temp splint needs sx tomorrow had sedation to reduce   R Recommendations: See Admitting Provider Note  Report given to:   Additional Notes: screamer vs wnl splinted pending left ankle sx tomorrow a/o x 4 bed restricted till after surgery

## 2023-06-02 NOTE — Sedation Documentation (Signed)
Xray arrived.

## 2023-06-02 NOTE — ED Notes (Signed)
Coke and sandwich bag given

## 2023-06-02 NOTE — Sedation Documentation (Signed)
Patient currently yelling out

## 2023-06-02 NOTE — ED Notes (Signed)
Patient A&Ox4 talking with daughter at bedside. No s/s of any distress. Will continue to monitor

## 2023-06-02 NOTE — Plan of Care (Signed)
  Problem: Nutrition: Goal: Adequate nutrition will be maintained Outcome: Progressing   Problem: Coping: Goal: Level of anxiety will decrease Outcome: Progressing   Problem: Elimination: Goal: Will not experience complications related to bowel motility Outcome: Progressing   Problem: Skin Integrity: Goal: Risk for impaired skin integrity will decrease Outcome: Progressing

## 2023-06-02 NOTE — Sedation Documentation (Signed)
Patient ankle reduced, OT tech applying splint

## 2023-06-02 NOTE — ED Provider Notes (Signed)
Patient is a handoff from [place], [place]. Plan at time of handoff is to await results of CT ankle imaging before discharge. Patient has already had ortho following who advised CT imaging and encouraged management with CT imaging and outpatient follow up with Dr. Odis Hollingshead.  Physical Exam  BP (!) 129/90   Pulse 97   Temp 98.9 F (37.2 C)   Resp 18   Wt 80 kg   SpO2 95%   Physical Exam Vitals and nursing note reviewed.  Constitutional:      General: She is in acute distress.     Appearance: She is not ill-appearing or diaphoretic.  Cardiovascular:     Rate and Rhythm: Normal rate.     Pulses: Normal pulses.  Musculoskeletal:     Comments: Left ankle deformity noted without skin color changes, patient nontender to palpation to proximal fibula without abnormalities palpated, patient able to wiggle toes, no other deformities noted besides left ankle, step-off suspected on medial aspect of ankle Soft compartments  Skin:    General: Skin is warm and dry.     Capillary Refill: Capillary refill takes less than 2 seconds.     Comments: No overlying skin color changes  Neurological:     Mental Status: She is alert.     Comments: Sensation intact distally     Procedures  Procedures  ED Course / MDM   Clinical Course as of 06/02/23 1926  Mon Jun 02, 2023  1304 DG Foot 2 Views Left [DR]  1432 Postreduction x-ray reviewed interpreted with improved alignment [DR]    Clinical Course User Index [DR] Margarita Grizzle, MD   Medical Decision Making Amount and/or Complexity of Data Reviewed Radiology: ordered. Decision-making details documented in ED Course.  Risk Prescription drug management. Decision regarding hospitalization.   Patient is a handoff from [place], [place]. Please see their note for full HPI and physical exam findings. Plan at time of handoff is to await results of CT ankle imaging before discharge. Patient has already had ortho following who advised CT imaging and  encouraged management with CT imaging and outpatient follow up with Dr. Odis Hollingshead.  3:45PM CT left ankle pending.  CT ankle resulted. Will consult orthopedics for clarification on management of patient with outpatient vs admission.  Spoke with Dr. Aundria Rud, orthopedic surgery, for clarification on management and he did advise outpatient follow up in office in the next week or so with Dr. Odis Hollingshead. Patient will need to work on elevation, and pain control at home in the meantime.  After attempting to discuss discharge home, patient stating that pain is too severe for discharge home on oral pain control. Patient also lives home by herself so concerned about mobility. Will consult hospitalist for admission. Patient given additional dose of 1mg  Dilaudid for continued persistent pain.  Spoke with Dr. Arlean Hopping, hospitalist, who will be admitting patient for continued pain control.        Smitty Knudsen, PA-C 06/02/23 1926    Ernie Avena, MD 06/03/23 [phone]

## 2023-06-02 NOTE — ED Provider Notes (Signed)
Accepted handoff at shift change from [facility]. Please see prior provider note for more detail.   Briefly: Patient is 67 y.o. "presented after falling while walking her dog few hours ago and injuring her left ankle.  Patient states that her foot is going opposite direction and that she broke her foot.  Patient is unsure of how she landed."  DDX: concern for strain/sprain, fracture, dislocation, neurovascular compromise, ischemic limb, compartment syndrome.   Plan:  - ankle xray showing fibular fracture and ankle dislocation. - Dr. Rosalia Hammers relocated ankle - ankle with instability. Splint and wrap applied. Foot n/v intact after reduction. - Consulted with Charma Igo, Ortho PA who recommended CT of patient's ankle. He also states that patient can follow up with Dr. Odis Hollingshead next week. - post-reduction xray showing: Improved alignment of previously noted comminuted trimalleolar ankle fracture with residual deformity at the fracture sites. - CT pending  3:30PM Care of [facility] transferred to [facility] and Dr. Karene Fry at the end of my shift as the patient will require reassessment once labs/imaging have resulted. Patient presentation, ED course, and plan of care discussed with review of all pertinent labs and imaging. Please see his/her note for further details regarding further ED course and disposition. Plan at time of handoff is assess patient after CT ankle and consult with ortho. This may be altered or completely changed at the discretion of the oncoming team pending results of further workup.    Dorthy Cooler, [place] 06/02/23 1538    Margarita Grizzle, MD 06/04/23 1254

## 2023-06-03 DIAGNOSIS — I10 Essential (primary) hypertension: Secondary | ICD-10-CM | POA: Diagnosis not present

## 2023-06-03 DIAGNOSIS — S82852B Displaced trimalleolar fracture of left lower leg, initial encounter for open fracture type I or II: Secondary | ICD-10-CM | POA: Diagnosis not present

## 2023-06-03 DIAGNOSIS — Z7982 Long term (current) use of aspirin: Secondary | ICD-10-CM | POA: Diagnosis not present

## 2023-06-03 DIAGNOSIS — Y92007 Garden or yard of unspecified non-institutional (private) residence as the place of occurrence of the external cause: Secondary | ICD-10-CM | POA: Diagnosis not present

## 2023-06-03 DIAGNOSIS — R1310 Dysphagia, unspecified: Secondary | ICD-10-CM | POA: Diagnosis not present

## 2023-06-03 DIAGNOSIS — E785 Hyperlipidemia, unspecified: Secondary | ICD-10-CM | POA: Diagnosis not present

## 2023-06-03 DIAGNOSIS — S93432A Sprain of tibiofibular ligament of left ankle, initial encounter: Secondary | ICD-10-CM | POA: Diagnosis not present

## 2023-06-03 DIAGNOSIS — Z7951 Long term (current) use of inhaled steroids: Secondary | ICD-10-CM | POA: Diagnosis not present

## 2023-06-03 DIAGNOSIS — W010XXA Fall on same level from slipping, tripping and stumbling without subsequent striking against object, initial encounter: Secondary | ICD-10-CM | POA: Diagnosis not present

## 2023-06-03 DIAGNOSIS — Z79899 Other long term (current) drug therapy: Secondary | ICD-10-CM | POA: Diagnosis not present

## 2023-06-03 DIAGNOSIS — K219 Gastro-esophageal reflux disease without esophagitis: Secondary | ICD-10-CM | POA: Diagnosis not present

## 2023-06-03 DIAGNOSIS — F32A Depression, unspecified: Secondary | ICD-10-CM | POA: Diagnosis not present

## 2023-06-03 DIAGNOSIS — S82852A Displaced trimalleolar fracture of left lower leg, initial encounter for closed fracture: Secondary | ICD-10-CM | POA: Diagnosis not present

## 2023-06-03 DIAGNOSIS — Z888 Allergy status to other drugs, medicaments and biological substances status: Secondary | ICD-10-CM | POA: Diagnosis not present

## 2023-06-03 DIAGNOSIS — S82892A Other fracture of left lower leg, initial encounter for closed fracture: Secondary | ICD-10-CM | POA: Diagnosis not present

## 2023-06-03 DIAGNOSIS — Y93K1 Activity, walking an animal: Secondary | ICD-10-CM | POA: Diagnosis not present

## 2023-06-03 DIAGNOSIS — F419 Anxiety disorder, unspecified: Secondary | ICD-10-CM | POA: Diagnosis not present

## 2023-06-03 DIAGNOSIS — S9305XA Dislocation of left ankle joint, initial encounter: Secondary | ICD-10-CM | POA: Diagnosis not present

## 2023-06-03 DIAGNOSIS — K5900 Constipation, unspecified: Secondary | ICD-10-CM | POA: Diagnosis not present

## 2023-06-03 DIAGNOSIS — G8918 Other acute postprocedural pain: Secondary | ICD-10-CM | POA: Diagnosis not present

## 2023-06-03 DIAGNOSIS — S82852D Displaced trimalleolar fracture of left lower leg, subsequent encounter for closed fracture with routine healing: Secondary | ICD-10-CM | POA: Diagnosis not present

## 2023-06-03 DIAGNOSIS — S82852G Displaced trimalleolar fracture of left lower leg, subsequent encounter for closed fracture with delayed healing: Secondary | ICD-10-CM | POA: Diagnosis not present

## 2023-06-03 DIAGNOSIS — S82899A Other fracture of unspecified lower leg, initial encounter for closed fracture: Secondary | ICD-10-CM | POA: Diagnosis present

## 2023-06-03 DIAGNOSIS — S82842A Displaced bimalleolar fracture of left lower leg, initial encounter for closed fracture: Secondary | ICD-10-CM | POA: Diagnosis not present

## 2023-06-03 MED ORDER — ALBUTEROL SULFATE (2.5 MG/3ML) 0.083% IN NEBU: 2.5000 mg | INHALATION_SOLUTION | Freq: Four times a day (QID) | RESPIRATORY_TRACT | Status: DC | PRN

## 2023-06-03 MED ORDER — HYDROMORPHONE HCL 1 MG/ML IJ SOLN
1.0000 mg | INTRAMUSCULAR | Status: DC | PRN
  Administered 2023-06-03: 1 mg via INTRAVENOUS
  Filled 2023-06-03: qty 1

## 2023-06-03 MED ORDER — METHOCARBAMOL 500 MG PO TABS
500.0000 mg | ORAL_TABLET | Freq: Three times a day (TID) | ORAL | Status: DC | PRN
  Filled 2023-06-03: qty 1

## 2023-06-03 MED ORDER — METHOCARBAMOL 1000 MG/10ML IJ SOLN
500.0000 mg | Freq: Four times a day (QID) | INTRAVENOUS | Status: DC
  Administered 2023-06-03 – 2023-06-10 (×23): 500 mg via INTRAVENOUS
  Filled 2023-06-03 (×3): qty 500
  Filled 2023-06-03: qty 5
  Filled 2023-06-03 (×4): qty 500
  Filled 2023-06-03: qty 5
  Filled 2023-06-03 (×2): qty 500
  Filled 2023-06-03: qty 5
  Filled 2023-06-03 (×6): qty 500
  Filled 2023-06-03 (×2): qty 5
  Filled 2023-06-03 (×4): qty 500
  Filled 2023-06-03: qty 5
  Filled 2023-06-03 (×3): qty 500

## 2023-06-03 MED ORDER — KETOROLAC TROMETHAMINE 15 MG/ML IJ SOLN: 15.0000 mg | Freq: Four times a day (QID) | INTRAMUSCULAR | Status: DC

## 2023-06-03 MED ORDER — PANTOPRAZOLE SODIUM 40 MG PO TBEC
40.0000 mg | DELAYED_RELEASE_TABLET | Freq: Every day | ORAL | Status: DC
  Administered 2023-06-03 – 2023-06-13 (×11): 40 mg via ORAL
  Filled 2023-06-03 (×11): qty 1

## 2023-06-03 MED ORDER — METHOCARBAMOL 1000 MG/10ML IJ SOLN
500.0000 mg | Freq: Once | INTRAVENOUS | Status: AC
  Administered 2023-06-03: 500 mg via INTRAVENOUS
  Filled 2023-06-03: qty 5

## 2023-06-03 MED ORDER — LISINOPRIL 20 MG PO TABS: 20.0000 mg | ORAL_TABLET | Freq: Every day | ORAL | Status: DC

## 2023-06-03 MED ORDER — MOMETASONE FURO-FORMOTEROL FUM 200-5 MCG/ACT IN AERO
2.0000 | INHALATION_SPRAY | Freq: Two times a day (BID) | RESPIRATORY_TRACT | Status: DC
  Administered 2023-06-04 – 2023-06-12 (×17): 2 via RESPIRATORY_TRACT
  Filled 2023-06-03: qty 8.8

## 2023-06-03 MED ORDER — FLUOXETINE HCL 20 MG PO CAPS
60.0000 mg | ORAL_CAPSULE | Freq: Every day | ORAL | Status: DC
  Administered 2023-06-03 – 2023-06-13 (×11): 60 mg via ORAL
  Filled 2023-06-03 (×11): qty 3

## 2023-06-03 MED ORDER — OXYCODONE HCL 5 MG PO TABS
5.0000 mg | ORAL_TABLET | ORAL | Status: DC | PRN
  Administered 2023-06-05 – 2023-06-12 (×5): 5 mg via ORAL
  Filled 2023-06-03 (×5): qty 1

## 2023-06-03 MED ORDER — ENOXAPARIN SODIUM 40 MG/0.4ML IJ SOSY
40.0000 mg | PREFILLED_SYRINGE | INTRAMUSCULAR | Status: DC
  Administered 2023-06-03: 40 mg via SUBCUTANEOUS
  Filled 2023-06-03: qty 0.4

## 2023-06-03 MED ORDER — HYDROMORPHONE HCL 1 MG/ML IJ SOLN: 0.5000 mg | Freq: Once | INTRAMUSCULAR | Status: DC

## 2023-06-03 MED ORDER — OXYCODONE HCL 5 MG PO TABS
10.0000 mg | ORAL_TABLET | ORAL | Status: DC | PRN
  Administered 2023-06-03 – 2023-06-13 (×29): 10 mg via ORAL
  Filled 2023-06-03 (×31): qty 2

## 2023-06-03 MED ORDER — ACETAMINOPHEN 325 MG PO TABS
650.0000 mg | ORAL_TABLET | Freq: Four times a day (QID) | ORAL | Status: DC | PRN
  Administered 2023-06-09 – 2023-06-11 (×3): 650 mg via ORAL
  Filled 2023-06-03 (×4): qty 2

## 2023-06-03 MED ORDER — OXYCODONE HCL 5 MG PO TABS: 5.0000 mg | ORAL_TABLET | ORAL | Status: DC | PRN

## 2023-06-03 MED ORDER — KETOROLAC TROMETHAMINE 15 MG/ML IJ SOLN
15.0000 mg | Freq: Four times a day (QID) | INTRAMUSCULAR | Status: AC
  Administered 2023-06-03 – 2023-06-08 (×19): 15 mg via INTRAVENOUS
  Filled 2023-06-03 (×19): qty 1

## 2023-06-03 MED ORDER — ALBUTEROL SULFATE HFA 108 (90 BASE) MCG/ACT IN AERS: 2.0000 | INHALATION_SPRAY | Freq: Four times a day (QID) | RESPIRATORY_TRACT | Status: DC | PRN

## 2023-06-03 MED ORDER — ROSUVASTATIN CALCIUM 20 MG PO TABS
40.0000 mg | ORAL_TABLET | Freq: Every day | ORAL | Status: DC
  Administered 2023-06-03 – 2023-06-13 (×11): 40 mg via ORAL
  Filled 2023-06-03 (×11): qty 2

## 2023-06-03 MED ORDER — PANTOPRAZOLE SODIUM 40 MG PO TBEC: 40.0000 mg | DELAYED_RELEASE_TABLET | Freq: Every day | ORAL | Status: DC

## 2023-06-03 MED ORDER — BUPROPION HCL ER (XL) 150 MG PO TB24
300.0000 mg | ORAL_TABLET | Freq: Every day | ORAL | Status: DC
  Administered 2023-06-03 – 2023-06-13 (×11): 300 mg via ORAL
  Filled 2023-06-03 (×11): qty 2

## 2023-06-03 MED ORDER — MONTELUKAST SODIUM 10 MG PO TABS
10.0000 mg | ORAL_TABLET | Freq: Every day | ORAL | Status: DC
  Administered 2023-06-03 – 2023-06-12 (×9): 10 mg via ORAL
  Filled 2023-06-03 (×10): qty 1

## 2023-06-03 MED ORDER — HYDROMORPHONE HCL 1 MG/ML IJ SOLN
0.5000 mg | INTRAMUSCULAR | Status: DC | PRN
  Administered 2023-06-03 (×2): 0.5 mg via INTRAVENOUS
  Filled 2023-06-03 (×2): qty 0.5

## 2023-06-03 MED ORDER — ACETAMINOPHEN 500 MG PO TABS
1000.0000 mg | ORAL_TABLET | Freq: Three times a day (TID) | ORAL | Status: AC
  Administered 2023-06-03 – 2023-06-05 (×7): 1000 mg via ORAL
  Filled 2023-06-03 (×8): qty 2

## 2023-06-03 MED ORDER — HYDROMORPHONE HCL 1 MG/ML IJ SOLN
0.5000 mg | INTRAMUSCULAR | Status: DC | PRN
  Administered 2023-06-03 – 2023-06-06 (×8): 1 mg via INTRAVENOUS
  Filled 2023-06-03 (×8): qty 1

## 2023-06-03 NOTE — Consult Note (Signed)
Reason for Consult:Left ankle fx Referring Physician: Lacretia Nicks Time called: 1037 Time at bedside: 1040   Haley White is an 67 y.o. female.  HPI: Omaya tripped and fell at home yesterday. She had immediate left ankle pain and could not bear weight. She was brought to the ED where x-rays showed a left ankle fx. She was reduced by the ED. Orthopedic surgery was then contacted and recommended NWB and OP f/u. However, she was admitted instead and orthopedic surgery was asked to formally consult the following day. She is having severe pain and the time of my visit and could not really contribute to history.  History reviewed. No pertinent past medical history.  History reviewed. No pertinent surgical history.  History reviewed. No pertinent family history.  Social History:  reports that she has never smoked. She has never used smokeless tobacco. She reports that she does not currently use alcohol. She reports that she does not use drugs.  Allergies:  Allergies  Allergen Reactions   Molds & Smuts Anaphylaxis   Biaxin [Clarithromycin] Other (See Comments)    GI Upset   Prednisone Other (See Comments)    Suicidal   Tequin [Gatifloxacin] Other (See Comments)    GI upset, Thrush   Serevent [Salmeterol] Palpitations    Medications: I have reviewed the patient's current medications.  Results for orders placed or performed during the hospital encounter of 06/02/23 (from the past 48 hour(s))  CBC with Differential/Platelet     Status: Abnormal   Collection Time: 06/02/23  8:01 PM  Result Value Ref Range   WBC 9.7 4.0 - 10.5 K/uL   RBC 3.67 (L) 3.87 - 5.11 MIL/uL   Hemoglobin 11.6 (L) 12.0 - 15.0 g/dL   HCT 38.1 (L) 01.7 - 51.0 %   MCV 95.6 80.0 - 100.0 fL   MCH 31.6 26.0 - 34.0 pg   MCHC 33.0 30.0 - 36.0 g/dL   RDW 25.8 52.7 - 78.2 %   Platelets 255 150 - 400 K/uL   nRBC 0.0 0.0 - 0.2 %   Neutrophils Relative % 58 %   Neutro Abs 5.6 1.7 - 7.7 K/uL   Lymphocytes Relative 28 %    Lymphs Abs 2.7 0.7 - 4.0 K/uL   Monocytes Relative 12 %   Monocytes Absolute 1.2 (H) 0.1 - 1.0 K/uL   Eosinophils Relative 1 %   Eosinophils Absolute 0.1 0.0 - 0.5 K/uL   Basophils Relative 1 %   Basophils Absolute 0.1 0.0 - 0.1 K/uL   Immature Granulocytes 0 %   Abs Immature Granulocytes 0.03 0.00 - 0.07 K/uL    Comment: Performed at [facility], [address] [place], [place] [address]  Comprehensive metabolic panel     Status: Abnormal   Collection Time: 06/02/23  8:01 PM  Result Value Ref Range   Sodium 142 135 - 145 mmol/L   Potassium 4.1 3.5 - 5.1 mmol/L   Chloride 105 98 - 111 mmol/L   CO2 23 22 - 32 mmol/L   Glucose, Bld 87 70 - 99 mg/dL    Comment: Glucose reference range applies only to samples taken after fasting for at least 8 hours.   BUN 16 8 - 23 mg/dL   Creatinine, Ser 6.14 0.44 - 1.00 mg/dL   Calcium 8.6 (L) 8.9 - 10.3 mg/dL   Total Protein 5.8 (L) 6.5 - 8.1 g/dL   Albumin 3.7 3.5 - 5.0 g/dL   AST 23 15 - 41 U/L  ALT 27 0 - 44 U/L   Alkaline Phosphatase 51 38 - 126 U/L   Total Bilirubin 1.1 0.3 - 1.2 mg/dL   GFR, Estimated >13 >08 mL/min    Comment: (NOTE) Calculated using the CKD-EPI Creatinine Equation (2021)    Anion gap 14 5 - 15    Comment: Performed at [facility], [address], [place], [place] [address]  Magnesium     Status: None   Collection Time: 06/02/23  8:01 PM  Result Value Ref Range   Magnesium 1.7 1.7 - 2.4 mg/dL    Comment: Performed at [facility], [address] [place], [place] [address]  VITAMIN D 25 Hydroxy (Vit-D Deficiency, Fractures)     Status: None   Collection Time: 06/02/23  8:01 PM  Result Value Ref Range   Vit D, 25-Hydroxy 80.96 30 - 100 ng/mL    Comment: (NOTE) Vitamin D deficiency has been defined by the Institute of Medicine  and an Endocrine Society practice guideline as a level of serum 25-OH  vitamin D less than 20 ng/mL (1,2). The Endocrine Society went on to  further define vitamin D  insufficiency as a level between 21 and 29  ng/mL (2).  1. IOM (Institute of Medicine). 2010. Dietary reference intakes for  calcium and D. [name]: [employer]. 2. Holick MF, Binkley [place], Bischoff-Ferrari HA, et al. Evaluation,  treatment, and prevention of vitamin D deficiency: an Endocrine  Society clinical practice guideline, JCEM. 2011 Jul; 96(7): 1911-30.  Performed at [facility], [address] [place], [place] [address]   CBC with Differential/Platelet     Status: Abnormal   Collection Time: 06/03/23 12:54 AM  Result Value Ref Range   WBC 9.0 4.0 - 10.5 K/uL   RBC 3.67 (L) 3.87 - 5.11 MIL/uL   Hemoglobin 12.0 12.0 - 15.0 g/dL   HCT 32.4 40.1 - 02.7 %   MCV 98.4 80.0 - 100.0 fL   MCH 32.7 26.0 - 34.0 pg   MCHC 33.2 30.0 - 36.0 g/dL   RDW 25.3 66.4 - 40.3 %   Platelets 231 150 - 400 K/uL   nRBC 0.0 0.0 - 0.2 %   Neutrophils Relative % 64 %   Neutro Abs 5.7 1.7 - 7.7 K/uL   Lymphocytes Relative 20 %   Lymphs Abs 1.8 0.7 - 4.0 K/uL   Monocytes Relative 14 %   Monocytes Absolute 1.3 (H) 0.1 - 1.0 K/uL   Eosinophils Relative 1 %   Eosinophils Absolute 0.1 0.0 - 0.5 K/uL   Basophils Relative 1 %   Basophils Absolute 0.1 0.0 - 0.1 K/uL   Immature Granulocytes 0 %   Abs Immature Granulocytes 0.03 0.00 - 0.07 K/uL    Comment: Performed at [facility], [address] [place], [place] [address]  Comprehensive metabolic panel     Status: Abnormal   Collection Time: 06/03/23 12:54 AM  Result Value Ref Range   Sodium 139 135 - 145 mmol/L   Potassium 4.3 3.5 - 5.1 mmol/L   Chloride 106 98 - 111 mmol/L   CO2 22 22 - 32 mmol/L   Glucose, Bld 115 (H) 70 - 99 mg/dL    Comment: Glucose reference range applies only to samples taken after fasting for at least 8 hours.   BUN 18 8 - 23 mg/dL   Creatinine, Ser 9.56 0.44 - 1.00 mg/dL   Calcium 8.6 (L) 8.9 - 10.3 mg/dL  Total Protein 6.1 (L) 6.5 - 8.1 g/dL   Albumin 3.9 3.5 - 5.0 g/dL   AST 22  15 - 41 U/L   ALT 29 0 - 44 U/L   Alkaline Phosphatase 56 38 - 126 U/L   Total Bilirubin 1.3 (H) 0.3 - 1.2 mg/dL   GFR, Estimated >46 >96 mL/min    Comment: (NOTE) Calculated using the CKD-EPI Creatinine Equation (2021)    Anion gap 11 5 - 15    Comment: Performed at [facility], [address] [place], [place] [address]  Magnesium     Status: None   Collection Time: 06/03/23 12:54 AM  Result Value Ref Range   Magnesium 1.8 1.7 - 2.4 mg/dL    Comment: Performed at [facility], [address] [place], [place] [address]  Protime-INR     Status: None   Collection Time: 06/03/23 12:54 AM  Result Value Ref Range   Prothrombin Time 13.0 11.4 - 15.2 seconds   INR 1.0 0.8 - 1.2    Comment: (NOTE) INR goal varies based on device and disease states. Performed at [facility], [address], [place], [place] [address]   Surgical PCR screen     Status: None   Collection Time: 06/03/23  4:37 AM   Specimen: Nasal Mucosa; Nasal Swab  Result Value Ref Range   MRSA, PCR NEGATIVE NEGATIVE   Staphylococcus aureus NEGATIVE NEGATIVE    Comment: (NOTE) The Xpert SA Assay (FDA approved for NASAL specimens in patients 20 years of age and older), is one component of a comprehensive surveillance program. It is not intended to diagnose infection nor to guide or monitor treatment. Performed at [facility], [address], [place], [place] [address]   Urinalysis, Complete w Microscopic -Urine, Clean Catch     Status: None   Collection Time: 06/03/23  6:41 AM  Result Value Ref Range   Color, Urine YELLOW YELLOW   APPearance CLEAR CLEAR   Specific Gravity, Urine 1.025 1.005 - 1.030   pH 5.0 5.0 - 8.0   Glucose, UA NEGATIVE NEGATIVE mg/dL   Hgb urine dipstick NEGATIVE NEGATIVE   Bilirubin Urine NEGATIVE NEGATIVE   Ketones, ur NEGATIVE NEGATIVE mg/dL   Protein, ur NEGATIVE NEGATIVE mg/dL   Nitrite NEGATIVE NEGATIVE   Leukocytes,Ua NEGATIVE NEGATIVE   RBC / HPF 0-5 0  - 5 RBC/hpf   WBC, UA 0-5 0 - 5 WBC/hpf   Bacteria, UA NONE SEEN NONE SEEN   Squamous Epithelial / HPF 0-5 0 - 5 /HPF    Comment: Performed at [facility], [address], [place], [place] [address]    CT Ankle Left Wo Contrast  Result Date: 06/02/2023 CLINICAL DATA:  Left ankle fracture. EXAM: CT OF THE LEFT ANKLE WITHOUT CONTRAST TECHNIQUE: Multidetector CT imaging of the left ankle was performed according to the standard protocol. Multiplanar CT image reconstructions were also generated. RADIATION DOSE REDUCTION: This exam was performed according to the departmental dose-optimization program which includes automated exposure control, adjustment of the mA and/or kV according to patient size and/or use of iterative reconstruction technique. COMPARISON:  Left ankle x-rays from same day. FINDINGS: Bones/Joint/Cartilage Acute comminuted fracture involving the base of the medial malleolus with 9 mm medial displacement. Acute oblique fracture of the distal fibular metadiaphysis with 4 mm lateral displacement and mild overriding. Acute minimally displaced longitudinal fracture of the posterior malleolus. Small acute avulsion fracture of the anterolateral tibial plafond.  Lateral subluxation of the talus with respect to the tibial plafond. Midfoot degenerative changes. No joint effusion. Ligaments Ligaments are suboptimally evaluated by CT. Muscles and Tendons Grossly intact. Prominent thickening of the peroneal longus and distal Achilles tendons, likely reflecting tendinosis. Soft tissue Diffuse soft tissue swelling. No fluid collection or hematoma. No soft tissue mass. IMPRESSION: 1. Acute trimalleolar fracture-subluxation as described above. 2. Additional small acute avulsion fracture of the anterolateral tibial plafond. Electronically Signed   By: Obie Dredge M.D.   On: 06/02/2023 17:05   DG Ankle 2 Views Left  Result Date: 06/02/2023 CLINICAL DATA:  Post splinting EXAM: LEFT ANKLE - 2 VIEW  COMPARISON:  Left tib-fib radiographs-earlier same day FINDINGS: Fine bony detail is degraded secondary to overlying casting material Improved alignment of previously noted comminuted trimalleolar ankle fracture with residual deformity at the fracture sites. No new fracture or dislocation. No radiopaque foreign body. IMPRESSION: Improved alignment of previously noted comminuted trimalleolar ankle fracture with residual deformity at the fracture sites. Electronically Signed   By: Simonne Come M.D.   On: 06/02/2023 14:45   DG Tibia/Fibula Left  Result Date: 06/02/2023 CLINICAL DATA:  Fall.  Pain. EXAM: LEFT ANKLE - 2 VIEW; LEFT TIBIA AND FIBULA - 2 VIEW; LEFT FOOT - 2 VIEW COMPARISON:  Left foot radiographs 04/10/2023, left ankle radiographs 10/17/2021, MRI left ankle 09/08/2021 FINDINGS: Left ankle: There is an acute, comminuted fracture of the distal fibular diaphysis with lateral angulation of the distal fibular component and up to approximately 1.2 cm lateral displacement of the distal fibular component with respect of the proximal fibular component. Approx There is associated lateral dislocation of the talus and distal fibula with respect to the tibia and proximal fibula. There is a curvilinear fracture extending through to portions of the distal medial tibial metaphyseal through the epiphyseal cortex with up to 2 mm diastasis/cortical step-off. Possible mild anterior subluxation of the talus with respect of the tibia on lateral view. -- Left foot: Mild hallux valgus. Moderate dorsal talonavicular, naviculocuneiform, and tarsometatarsal degenerative osteophytosis with moderate joint space narrowing. Left tibia and fibula: No additional more proximal tibia or fibula acute fracture is seen. 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 IMPRESSION: 1. Acute, comminuted fracture of the distal fibular diaphysis with lateral angulation of the distal fibular component and up to  approximately 1.2 cm lateral displacement of the distal fibular component with respect of the proximal fibular component. 2. Lateral dislocation of the talus and the distal fibula fracture component with respect to the tibia and proximal fibular fracture component. 3. Acute, curvilinear fracture at the distal medial aspect of the tibia with up to 2 mm diastasis/cortical step-off. 4. Possible mild anterior subluxation of the talus with respect of the tibia on lateral view. 5. Moderate midfoot osteoarthritis. Electronically Signed   By: Neita Garnet M.D.   On: 06/02/2023 13:14   DG Ankle 2 Views Left  Result Date: 06/02/2023 CLINICAL DATA:  Fall.  Pain. EXAM: LEFT ANKLE - 2 VIEW; LEFT TIBIA AND FIBULA - 2 VIEW; LEFT FOOT - 2 VIEW COMPARISON:  Left foot radiographs 04/10/2023, left ankle radiographs 10/17/2021, MRI left ankle 09/08/2021 FINDINGS: Left ankle: There is an acute, comminuted fracture of the distal fibular diaphysis with lateral angulation of the distal fibular component and up to approximately 1.2 cm lateral displacement of the distal fibular component with respect of the proximal fibular component. Approx There is associated lateral dislocation of the talus and distal fibula with respect  to the tibia and proximal fibula. There is a curvilinear fracture extending through to portions of the distal medial tibial metaphyseal through the epiphyseal cortex with up to 2 mm diastasis/cortical step-off. Possible mild anterior subluxation of the talus with respect of the tibia on lateral view. -- Left foot: Mild hallux valgus. Moderate dorsal talonavicular, naviculocuneiform, and tarsometatarsal degenerative osteophytosis with moderate joint space narrowing. Left tibia and fibula: No additional more proximal tibia or fibula acute fracture is seen. 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 IMPRESSION: 1. Acute, comminuted fracture of the distal fibular diaphysis with lateral  angulation of the distal fibular component and up to approximately 1.2 cm lateral displacement of the distal fibular component with respect of the proximal fibular component. 2. Lateral dislocation of the talus and the distal fibula fracture component with respect to the tibia and proximal fibular fracture component. 3. Acute, curvilinear fracture at the distal medial aspect of the tibia with up to 2 mm diastasis/cortical step-off. 4. Possible mild anterior subluxation of the talus with respect of the tibia on lateral view. 5. Moderate midfoot osteoarthritis. Electronically Signed   By: Neita Garnet M.D.   On: 06/02/2023 13:14   DG Foot 2 Views Left  Result Date: 06/02/2023 CLINICAL DATA:  Fall.  Pain. EXAM: LEFT ANKLE - 2 VIEW; LEFT TIBIA AND FIBULA - 2 VIEW; LEFT FOOT - 2 VIEW COMPARISON:  Left foot radiographs 04/10/2023, left ankle radiographs 10/17/2021, MRI left ankle 09/08/2021 FINDINGS: Left ankle: There is an acute, comminuted fracture of the distal fibular diaphysis with lateral angulation of the distal fibular component and up to approximately 1.2 cm lateral displacement of the distal fibular component with respect of the proximal fibular component. Approx There is associated lateral dislocation of the talus and distal fibula with respect to the tibia and proximal fibula. There is a curvilinear fracture extending through to portions of the distal medial tibial metaphyseal through the epiphyseal cortex with up to 2 mm diastasis/cortical step-off. Possible mild anterior subluxation of the talus with respect of the tibia on lateral view. -- Left foot: Mild hallux valgus. Moderate dorsal talonavicular, naviculocuneiform, and tarsometatarsal degenerative osteophytosis with moderate joint space narrowing. Left tibia and fibula: No additional more proximal tibia or fibula acute fracture is seen. 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 IMPRESSION: 1. Acute, comminuted  fracture of the distal fibular diaphysis with lateral angulation of the distal fibular component and up to approximately 1.2 cm lateral displacement of the distal fibular component with respect of the proximal fibular component. 2. Lateral dislocation of the talus and the distal fibula fracture component with respect to the tibia and proximal fibular fracture component. 3. Acute, curvilinear fracture at the distal medial aspect of the tibia with up to 2 mm diastasis/cortical step-off. 4. Possible mild anterior subluxation of the talus with respect of the tibia on lateral view. 5. Moderate midfoot osteoarthritis. Electronically Signed   By: Neita Garnet M.D.   On: 06/02/2023 13:14    Review of Systems  Unable to perform ROS: Acuity of condition   Blood pressure (!) 109/58, pulse 85, temperature 98.2 F (36.8 C), resp. rate 18, height (!) 5.4" (0.137 m), weight 103.4 kg, SpO2 94%. Physical Exam Constitutional:      General: She is in acute distress.     Appearance: She is well-developed. She is not diaphoretic.  HENT:     Head: Normocephalic and atraumatic.  Eyes:     General: No scleral  icterus.       Right eye: No discharge.        Left eye: No discharge.     Conjunctiva/sclera: Conjunctivae normal.  Cardiovascular:     Rate and Rhythm: Normal rate and regular rhythm.  Pulmonary:     Effort: Pulmonary effort is normal. No respiratory distress.  Musculoskeletal:     Cervical back: Normal range of motion.     Comments: LLE No traumatic wounds, ecchymosis, or rash  Short leg splint in place  No knee effusion  Knee stable to varus/ valgus and anterior/posterior stress  Sens DPN, SPN, TN grossly intact  Motor EHL grossly intact  Toes perfused, No significant edema  Skin:    General: Skin is warm and dry.  Neurological:     Mental Status: She is alert.  Psychiatric:        Mood and Affect: Mood normal.        Behavior: Behavior normal.     Assessment/Plan: Left ankle fx -- Will  need ORIF once swelling reduced, likely next week. Recommend ice, elevation, and NWB (not WBAT as listed in the H&P yesterday). F/u with Dr. Odis Hollingshead at end of week or early next.    Freeman Caldron, PA-C Orthopedic Surgery [phone] 06/03/2023, 10:45 AM

## 2023-06-03 NOTE — Progress Notes (Signed)
PT Cancellation Note  Patient Details Name: Haley White MRN: [id number] DOB: 1956/02/05   Cancelled Treatment:    Reason Eval/Treat Not Completed: Pain limiting ability to participate;Other (comment) (Multiple attempts made to see today around pain medication schedule. Discussed with nurse. Patient having significant pain control issues at rest. PT will hold today and follow up next date as appropriate.)  Donna Bernard, PT, MPT  Ina Homes 06/03/2023, 12:43 PM

## 2023-06-03 NOTE — Evaluation (Signed)
Occupational Therapy Evaluation Patient Details Name: Haley White MRN: [id number] DOB: 08-Dec-1955 Today's Date: 06/03/2023   History of Present Illness Pt is a 67 y/o female presenting s/p fall while walking dog resulting in acute L trimalleolar ankle fracture. [facility] MD recommends conservative mgmt and WBAT. No significant past medical hx.   Clinical Impression   PTA, pt lives alone, typically completely Independent with ADLs, IADLs and mobility without AD. Pt presents now with significant deficits in LLE pain despite premedication. Due to pain, pt ultimately unable to advance EOB/OOB during session this AM, requiring Min A for UB ADL and up to Total A for LB ADLs bed level at this time. Patient will benefit from continued inpatient follow up therapy, <3 hours/day if pain remains uncontrolled at time of DC or if family unable to provide the currently needed physical assist.      Recommendations for follow up therapy are one component of a multi-disciplinary discharge planning process, led by the attending physician.  Recommendations may be updated based on patient status, additional functional criteria and insurance authorization.   Assistance Recommended at Discharge Frequent or constant Supervision/Assistance  Patient can return home with the following A lot of help with walking and/or transfers;A lot of help with bathing/dressing/bathroom    Functional Status Assessment  Patient has had a recent decline in their functional status and demonstrates the ability to make significant improvements in function in a reasonable and predictable amount of time.  Equipment Recommendations  BSC/3in1;Other (comment);Wheelchair cushion (measurements OT);Wheelchair (measurements OT) (RW; TBD pending progress)    Recommendations for Other Services       Precautions / Restrictions Precautions Precautions: Fall Restrictions Weight Bearing Restrictions: Yes LLE Weight Bearing: Weight bearing as  tolerated      Mobility Bed Mobility Overal bed mobility: Needs Assistance             General bed mobility comments: partially EOB with assist for LLE(so pt could attempt drinking from cup w/ improved posture) but unable to get EOB due to pain. +1 to scoot up in bed with pt using RLE to assist in bridging w/ 2nd person supporting LLE    Transfers                   General transfer comment: unable d/t pain      Balance                                           ADL either performed or assessed with clinical judgement   ADL Overall ADL's : Needs assistance/impaired Eating/Feeding: Set up;Bed level   Grooming: Set up;Bed level   Upper Body Bathing: Minimal assistance;Sitting;Bed level   Lower Body Bathing: Maximal assistance;Bed level;Sitting/lateral leans   Upper Body Dressing : Minimal assistance;Sitting;Bed level   Lower Body Dressing: Total assistance;Bed level;Sitting/lateral leans       Toileting- Clothing Manipulation and Hygiene: Total assistance;Bed level         General ADL Comments: Limited by pain despite receiving 2 pain meds prior to OT attempt - appears pain meds making pt loopy/sick-feeling rather than assisting with pain control     Vision Ability to See in Adequate Light: 0 Adequate Patient Visual Report: No change from baseline Vision Assessment?: No apparent visual deficits     Perception     Praxis      Pertinent Vitals/Pain Pain  Assessment Pain Assessment: 0-10 Pain Score: 10-Worst pain ever Pain Location: LLE w/ any movement/muscle contraction Pain Descriptors / Indicators: Grimacing, Guarding Pain Intervention(s): Monitored during session, Premedicated before session, Repositioned, Limited activity within patient's tolerance     Hand Dominance Right   Extremity/Trunk Assessment Upper Extremity Assessment Upper Extremity Assessment: Overall WFL for tasks assessed   Lower Extremity Assessment Lower  Extremity Assessment: Defer to PT evaluation   Cervical / Trunk Assessment Cervical / Trunk Assessment: Normal   Communication Communication Communication: No difficulties   Cognition Arousal/Alertness: Awake/alert Behavior During Therapy: WFL for tasks assessed/performed Overall Cognitive Status: Within Functional Limits for tasks assessed                                 General Comments: likely at baseline/WFL but does endorse feeling "loopy" s/p pain meds. appropriate responses during session but understandably internally distracted by pain     General Comments  Secure chat sent to MD to update on pt pain levels despite given pain meds    Exercises     Shoulder Instructions      Home Living Family/patient expects to be discharged to:: Private residence Living Arrangements: Alone Available Help at Discharge: Family;Available 24 hours/day (daughter currently not working) Type of Home: Other(Comment) (townhouse) Home Access: Stairs to enter [employer] of Steps: 3   Home Layout: One level               Home Equipment: Crutches;Other (comment) (knee scooter)          Prior Functioning/Environment Prior Level of Function : Independent/Modified Independent;Driving;History of Falls (last six months)             Mobility Comments: typically no AD. does endorse 2 other falls/injuries to L LE in past 1.5 years ADLs Comments: Indep with ADLs, IADLs, driving, walking dog when fall happened        OT Problem List: Impaired balance (sitting and/or standing);Decreased activity tolerance;Pain;Decreased knowledge of use of DME or AE      OT Treatment/Interventions: Self-care/ADL training;Therapeutic exercise;Energy conservation;DME and/or AE instruction;Therapeutic activities;Patient/family education;Balance training    OT Goals(Current goals can be found in the care plan section) Acute Rehab OT Goals Patient Stated Goal: pain control, be  able to go to [place] in a month OT Goal Formulation: With patient Time For Goal Achievement: 06/17/23 Potential to Achieve Goals: Good ADL Goals Pt Will Perform Lower Body Bathing: sit to/from stand;sitting/lateral leans;with min assist Pt Will Perform Lower Body Dressing: with min assist;sit to/from stand;sitting/lateral leans Pt Will Transfer to Toilet: with min assist;stand pivot transfer;bedside commode Pt Will Perform Toileting - Clothing Manipulation and hygiene: with min assist;sit to/from stand;sitting/lateral leans  OT Frequency: Min 1X/week    Co-evaluation              AM-PAC OT "6 Clicks" Daily Activity     Outcome Measure Help from another person eating meals?: A Little Help from another person taking care of personal grooming?: A Little Help from another person toileting, which includes using toliet, bedpan, or urinal?: A Lot Help from another person bathing (including washing, rinsing, drying)?: A Lot Help from another person to put on and taking off regular upper body clothing?: A Little Help from another person to put on and taking off regular lower body clothing?: Total 6 Click Score: 14   End of Session Nurse Communication: Mobility status  Activity Tolerance: Patient limited by  pain Patient left: in bed;with call bell/phone within reach;with bed alarm set  OT Visit Diagnosis: Unsteadiness on feet (R26.81);Other abnormalities of gait and mobility (R26.89);Muscle weakness (generalized) (M62.81)                Time: 4098-1191 OT Time Calculation (min): 27 min Charges:  OT General Charges $OT Visit: 1 Visit OT Evaluation $OT Eval Low Complexity: 1 Low OT Treatments $Therapeutic Activity: 8-22 mins  Bradd Canary, OTR/L Acute Rehab Services Office: [phone]   Lorre Munroe 06/03/2023, 8:22 AM

## 2023-06-03 NOTE — Progress Notes (Signed)
Transition of Care [facility]) - Inpatient Brief Assessment   Patient Details  Name: Haley White MRN: [id number] Date of Birth: 08-12-1956  Transition of Care [facility]) CM/SW Contact:    Janae Bridgeman, RN Phone Number: 06/03/2023, 3:34 PM   Clinical Narrative: CM met with the patient at the bedside to discuss TOC needs.  The patient lives alone and fell yesterday fracturing her Left ankle.  Patient is pending surgery likely next week.  The patient has daughter in town and she plans to follow up with the daughter to see if she can assist at the home.  The patient was in severe pain during the assessment and I was unable to complete the history and conversation at the bedside at this time.  I asked bedside nursing to follow up regarding pain medication administration.   Transition of Care Asessment: Insurance and Status: (P) Insurance coverage has been reviewed Patient has primary care physician: (P) Yes Home environment has been reviewed: (P) Yes - lives alone Prior level of function:: (P) Independent Prior/Current Home Services: (P) No current home services Social Determinants of Health Reivew: (P) SDOH reviewed needs interventions Readmission risk has been reviewed: (P) Yes Transition of care needs: (P) transition of care needs identified, TOC will continue to follow

## 2023-06-03 NOTE — Progress Notes (Addendum)
PROGRESS NOTE    Haley White  UXL:[id number] DOB: December 08, 1955 DOA: 06/02/2023 PCP: Lucky Cowboy, MD  Chief Complaint  Patient presents with   Ankle Pain    left    Brief Narrative:   Haley White is Haley White 67 y.o. female with with no reported significant past medical history, who is admitted to [facility] on 06/02/2023 with acute left trimalleolar ankle fracture after presenting from home to [facility] ED complaining of acute left ankle discomfort.   Admitted for pain control in the setting of left ankle fracture.   Assessment & Plan:   Principal Problem:   Trimalleolar fracture of left ankle Active Problems:   Fall at home, initial encounter   Acute left ankle pain  Acute Trimalleolar Fracture Subluxation Small Acute Avulsion Fracture of the Anterolateral Tibial Plafond S/p manual reduction under conscious sedation in the ED Pain is uncontrolled -> schedule APAP and toradol and robaxin.  Oxycodone and dilaudid prn.  Nonweightbearing LLE, ice, elevation.  Follow up with Dr. Odis Hollingshead at end of week or early next week.  Needs ORIF when swelling reduced.   COPD? Continue home symbicort  Depression  Anxiety Attention Deficit Disorder? Wellbutrin, prozac Hold ritalin   Dyslipidemia Statin  GERD PPI  Hypertension Hold lisinopril while giving toradol      DVT prophylaxis: lovenox Code Status: full Family Communication: none Disposition:   Status is: Observation The patient will require care spanning > 2 midnights and should be moved to inpatient because: need for inpatient care   Consultants:  orthopedics  Procedures:  none  Antimicrobials:  Anti-infectives (From admission, onward)    None       Subjective: Complaining of pain   Objective: Vitals:   06/03/23 0411 06/03/23 0616 06/03/23 0814 06/03/23 1103  BP:  127/70 (!) 109/58 116/60  Pulse:  94 85 85  Resp:  18 18   Temp:  98.3 F (36.8 C) 98.2 F (36.8 C)   TempSrc:  Oral    SpO2:  97%  94%   Weight: 103.4 kg 103.4 kg    Height:        Intake/Output Summary (Last 24 hours) at 06/03/2023 1542 Last data filed at 06/03/2023 0645 Gross per 24 hour  Intake 10 ml  Output 250 ml  Net -240 ml   Filed Weights   06/02/23 2200 06/03/23 0411 06/03/23 0616  Weight: 103.4 kg 103.4 kg 103.4 kg    Examination:  General exam: appears to be extremely uncomfortable  Respiratory system: unlabored Cardiovascular system: RRR Central nervous system: Alert and oriented. No focal neurological deficits. Extremities: LLE in splint     Data Reviewed: I have personally reviewed following labs and imaging studies  CBC: Recent Labs  Lab 06/02/23 2001 06/03/23 0054  WBC 9.7 9.0  NEUTROABS 5.6 5.7  HGB 11.6* 12.0  HCT 35.1* 36.1  MCV 95.6 98.4  PLT 255 231    Basic Metabolic Panel: Recent Labs  Lab 06/02/23 2001 06/03/23 0054  NA 142 139  K 4.1 4.3  CL 105 106  CO2 23 22  GLUCOSE 87 115*  BUN 16 18  CREATININE 0.96 0.98  CALCIUM 8.6* 8.6*  MG 1.7 1.8    GFR: CrCl cannot be calculated (Unknown ideal weight.).  Liver Function Tests: Recent Labs  Lab 06/02/23 2001 06/03/23 0054  AST 23 22  ALT 27 29  ALKPHOS 51 56  BILITOT 1.1 1.3*  PROT 5.8* 6.1*  ALBUMIN 3.7 3.9    CBG: No results  for input(s): "GLUCAP" in the last 168 hours.   Recent Results (from the past 240 hour(s))  Surgical PCR screen     Status: None   Collection Time: 06/03/23  4:37 AM   Specimen: Nasal Mucosa; Nasal Swab  Result Value Ref Range Status   MRSA, PCR NEGATIVE NEGATIVE Final   Staphylococcus aureus NEGATIVE NEGATIVE Final    Comment: (NOTE) The Xpert SA Assay (FDA approved for NASAL specimens in patients 64 years of age and older), is one component of [employer] [place] comprehensive surveillance program. It is not intended to diagnose infection nor to guide or monitor treatment. Performed at [facility], [address] [place], [place] [address]          Radiology  Studies: CT Ankle Left Wo Contrast  Result Date: 06/02/2023 CLINICAL DATA:  Left ankle fracture. EXAM: CT OF THE LEFT ANKLE WITHOUT CONTRAST TECHNIQUE: Multidetector CT imaging of the left ankle was performed according to the standard protocol. Multiplanar CT image reconstructions were also generated. RADIATION DOSE REDUCTION: This exam was performed according to the departmental dose-optimization program which includes automated exposure control, adjustment of the mA and/or kV according to patient size and/or use of iterative reconstruction technique. COMPARISON:  Left ankle x-rays from same day. FINDINGS: Bones/Joint/Cartilage Acute comminuted fracture involving the base of the medial malleolus with 9 mm medial displacement. Acute oblique fracture of the distal fibular metadiaphysis with 4 mm lateral displacement and mild overriding. Acute minimally displaced longitudinal fracture of the posterior malleolus. Small acute avulsion fracture of the anterolateral tibial plafond. Lateral subluxation of the talus with respect to the tibial plafond. Midfoot degenerative changes. No joint effusion. Ligaments Ligaments are suboptimally evaluated by CT. Muscles and Tendons Grossly intact. Prominent thickening of the peroneal longus and distal Achilles tendons, likely reflecting tendinosis. Soft tissue Diffuse soft tissue swelling. No fluid collection or hematoma. No soft tissue mass. IMPRESSION: 1. Acute trimalleolar fracture-subluxation as described above. 2. Additional small acute avulsion fracture of the anterolateral tibial plafond. Electronically Signed   By: Obie Dredge M.D.   On: 06/02/2023 17:05   DG Ankle 2 Views Left  Result Date: 06/02/2023 CLINICAL DATA:  Post splinting EXAM: LEFT ANKLE - 2 VIEW COMPARISON:  Left tib-fib radiographs-earlier same day FINDINGS: Fine bony detail is degraded secondary to overlying casting material Improved alignment of previously noted comminuted trimalleolar ankle  fracture with residual deformity at the fracture sites. No new fracture or dislocation. No radiopaque foreign body. IMPRESSION: Improved alignment of previously noted comminuted trimalleolar ankle fracture with residual deformity at the fracture sites. Electronically Signed   By: Simonne Come M.D.   On: 06/02/2023 14:45   DG Tibia/Fibula Left  Result Date: 06/02/2023 CLINICAL DATA:  Fall.  Pain. EXAM: LEFT ANKLE - 2 VIEW; LEFT TIBIA AND FIBULA - 2 VIEW; LEFT FOOT - 2 VIEW COMPARISON:  Left foot radiographs 04/10/2023, left ankle radiographs 10/17/2021, MRI left ankle 09/08/2021 FINDINGS: Left ankle: There is an acute, comminuted fracture of the distal fibular diaphysis with lateral angulation of the distal fibular component and up to approximately 1.2 cm lateral displacement of the distal fibular component with respect of the proximal fibular component. Approx There is associated lateral dislocation of the talus and distal fibula with respect to the tibia and proximal fibula. There is Nicholis Stepanek curvilinear fracture extending through to portions of the distal medial tibial metaphyseal through the epiphyseal cortex with up to 2 mm diastasis/cortical step-off. Possible mild anterior subluxation of the talus with respect of  the tibia on lateral view. -- Left foot: Mild hallux valgus. Moderate dorsal talonavicular, naviculocuneiform, and tarsometatarsal degenerative osteophytosis with moderate joint space narrowing. Left tibia and fibula: No additional more proximal tibia or fibula acute fracture is seen. 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 IMPRESSION: 1. Acute, comminuted fracture of the distal fibular diaphysis with lateral angulation of the distal fibular component and up to approximately 1.2 cm lateral displacement of the distal fibular component with respect of the proximal fibular component. 2. Lateral dislocation of the talus and the distal fibula fracture component with respect  to the tibia and proximal fibular fracture component. 3. Acute, curvilinear fracture at the distal medial aspect of the tibia with up to 2 mm diastasis/cortical step-off. 4. Possible mild anterior subluxation of the talus with respect of the tibia on lateral view. 5. Moderate midfoot osteoarthritis. Electronically Signed   By: Neita Garnet M.D.   On: 06/02/2023 13:14   DG Ankle 2 Views Left  Result Date: 06/02/2023 CLINICAL DATA:  Fall.  Pain. EXAM: LEFT ANKLE - 2 VIEW; LEFT TIBIA AND FIBULA - 2 VIEW; LEFT FOOT - 2 VIEW COMPARISON:  Left foot radiographs 04/10/2023, left ankle radiographs 10/17/2021, MRI left ankle 09/08/2021 FINDINGS: Left ankle: There is an acute, comminuted fracture of the distal fibular diaphysis with lateral angulation of the distal fibular component and up to approximately 1.2 cm lateral displacement of the distal fibular component with respect of the proximal fibular component. Approx There is associated lateral dislocation of the talus and distal fibula with respect to the tibia and proximal fibula. There is Gaetan Spieker curvilinear fracture extending through to portions of the distal medial tibial metaphyseal through the epiphyseal cortex with up to 2 mm diastasis/cortical step-off. Possible mild anterior subluxation of the talus with respect of the tibia on lateral view. -- Left foot: Mild hallux valgus. Moderate dorsal talonavicular, naviculocuneiform, and tarsometatarsal degenerative osteophytosis with moderate joint space narrowing. Left tibia and fibula: No additional more proximal tibia or fibula acute fracture is seen. 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 IMPRESSION: 1. Acute, comminuted fracture of the distal fibular diaphysis with lateral angulation of the distal fibular component and up to approximately 1.2 cm lateral displacement of the distal fibular component with respect of the proximal fibular component. 2. Lateral dislocation of the talus and  the distal fibula fracture component with respect to the tibia and proximal fibular fracture component. 3. Acute, curvilinear fracture at the distal medial aspect of the tibia with up to 2 mm diastasis/cortical step-off. 4. Possible mild anterior subluxation of the talus with respect of the tibia on lateral view. 5. Moderate midfoot osteoarthritis. Electronically Signed   By: Neita Garnet M.D.   On: 06/02/2023 13:14   DG Foot 2 Views Left  Result Date: 06/02/2023 CLINICAL DATA:  Fall.  Pain. EXAM: LEFT ANKLE - 2 VIEW; LEFT TIBIA AND FIBULA - 2 VIEW; LEFT FOOT - 2 VIEW COMPARISON:  Left foot radiographs 04/10/2023, left ankle radiographs 10/17/2021, MRI left ankle 09/08/2021 FINDINGS: Left ankle: There is an acute, comminuted fracture of the distal fibular diaphysis with lateral angulation of the distal fibular component and up to approximately 1.2 cm lateral displacement of the distal fibular component with respect of the proximal fibular component. Approx There is associated lateral dislocation of the talus and distal fibula with respect to the tibia and proximal fibula. There is Darnetta Kesselman curvilinear fracture extending through to portions of the distal medial tibial metaphyseal through  the epiphyseal cortex with up to 2 mm diastasis/cortical step-off. Possible mild anterior subluxation of the talus with respect of the tibia on lateral view. -- Left foot: Mild hallux valgus. Moderate dorsal talonavicular, naviculocuneiform, and tarsometatarsal degenerative osteophytosis with moderate joint space narrowing. Left tibia and fibula: No additional more proximal tibia or fibula acute fracture is seen. 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 IMPRESSION: 1. Acute, comminuted fracture of the distal fibular diaphysis with lateral angulation of the distal fibular component and up to approximately 1.2 cm lateral displacement of the distal fibular component with respect of the proximal fibular  component. 2. Lateral dislocation of the talus and the distal fibula fracture component with respect to the tibia and proximal fibular fracture component. 3. Acute, curvilinear fracture at the distal medial aspect of the tibia with up to 2 mm diastasis/cortical step-off. 4. Possible mild anterior subluxation of the talus with respect of the tibia on lateral view. 5. Moderate midfoot osteoarthritis. Electronically Signed   By: Neita Garnet M.D.   On: 06/02/2023 13:14        Scheduled Meds:  acetaminophen  1,000 mg Oral Q8H   ketorolac  15 mg Intravenous Q6H   pantoprazole  40 mg Oral Daily   Continuous Infusions:  methocarbamol (ROBAXIN) IV       LOS: 0 days    Time spent: over 30 min    Lacretia Nicks, MD [facility]   To contact the attending provider between 7A-7P or the covering provider during after hours 7P-7A, please log into the web site [email or web address] and access using universal [place] password for that web site. If you do not have the password, please call the hospital operator.  06/03/2023, 3:42 PM

## 2023-06-04 ENCOUNTER — Encounter (HOSPITAL_COMMUNITY): Payer: Self-pay | Admitting: Family Medicine

## 2023-06-04 ENCOUNTER — Inpatient Hospital Stay (HOSPITAL_COMMUNITY): Payer: Medicare HMO

## 2023-06-04 ENCOUNTER — Inpatient Hospital Stay (HOSPITAL_COMMUNITY): Payer: Medicare HMO | Admitting: Anesthesiology

## 2023-06-04 ENCOUNTER — Encounter (HOSPITAL_COMMUNITY)
Admission: EM | Disposition: A | Discharge status: Skilled Nursing Facility | Payer: Self-pay | Source: Home / Self Care | Attending: Internal Medicine

## 2023-06-04 DIAGNOSIS — S82842A Displaced bimalleolar fracture of left lower leg, initial encounter for closed fracture: Secondary | ICD-10-CM | POA: Diagnosis not present

## 2023-06-04 DIAGNOSIS — S82852A Displaced trimalleolar fracture of left lower leg, initial encounter for closed fracture: Secondary | ICD-10-CM | POA: Diagnosis not present

## 2023-06-04 DIAGNOSIS — G8918 Other acute postprocedural pain: Secondary | ICD-10-CM | POA: Diagnosis not present

## 2023-06-04 DIAGNOSIS — S82892A Other fracture of left lower leg, initial encounter for closed fracture: Secondary | ICD-10-CM | POA: Diagnosis not present

## 2023-06-04 HISTORY — PX: ORIF ANKLE FRACTURE: SHX5408

## 2023-06-04 HISTORY — PX: EXTERNAL FIXATION LEG: SHX1549

## 2023-06-04 SURGERY — OPEN REDUCTION INTERNAL FIXATION (ORIF) ANKLE FRACTURE
Anesthesia: Regional | Site: Ankle | Laterality: Left

## 2023-06-04 MED ORDER — DEXTROSE-SODIUM CHLORIDE 5-0.45 % IV SOLN: INTRAVENOUS | Status: AC

## 2023-06-04 MED ORDER — FENTANYL CITRATE PF 50 MCG/ML IJ SOSY
PREFILLED_SYRINGE | INTRAMUSCULAR | Status: AC
  Administered 2023-06-04: 100 ug via INTRAVENOUS
  Filled 2023-06-04: qty 2

## 2023-06-04 MED ORDER — ACETAMINOPHEN 500 MG PO TABS: 1000.0000 mg | ORAL_TABLET | Freq: Once | ORAL | Status: DC

## 2023-06-04 MED ORDER — ONDANSETRON HCL 4 MG/2ML IJ SOLN
INTRAMUSCULAR | Status: DC | PRN
  Administered 2023-06-04: 4 mg via INTRAVENOUS

## 2023-06-04 MED ORDER — 0.9 % SODIUM CHLORIDE (POUR BTL) OPTIME
TOPICAL | Status: DC | PRN
  Administered 2023-06-04: 1000 mL

## 2023-06-04 MED ORDER — OXYCODONE HCL 5 MG/5ML PO SOLN: 5.0000 mg | Freq: Once | ORAL | Status: DC | PRN

## 2023-06-04 MED ORDER — CELECOXIB 200 MG PO CAPS
ORAL_CAPSULE | ORAL | Status: AC
  Filled 2023-06-04: qty 1

## 2023-06-04 MED ORDER — TRAZODONE HCL 50 MG PO TABS
50.0000 mg | ORAL_TABLET | Freq: Every evening | ORAL | Status: DC | PRN
  Administered 2023-06-05 – 2023-06-12 (×4): 50 mg via ORAL
  Filled 2023-06-04 (×5): qty 1

## 2023-06-04 MED ORDER — FENTANYL CITRATE (PF) 100 MCG/2ML IJ SOLN
INTRAMUSCULAR | Status: AC
  Filled 2023-06-04: qty 2

## 2023-06-04 MED ORDER — VANCOMYCIN HCL 1000 MG IV SOLR
INTRAVENOUS | Status: AC
  Filled 2023-06-04: qty 20

## 2023-06-04 MED ORDER — MORPHINE SULFATE (PF) 2 MG/ML IV SOLN
2.0000 mg | INTRAVENOUS | Status: DC | PRN
  Administered 2023-06-04 – 2023-06-12 (×30): 2 mg via INTRAVENOUS
  Filled 2023-06-04 (×30): qty 1

## 2023-06-04 MED ORDER — MIDAZOLAM HCL 2 MG/2ML IJ SOLN
INTRAMUSCULAR | Status: AC
  Administered 2023-06-04: 2 mg via INTRAVENOUS
  Filled 2023-06-04: qty 2

## 2023-06-04 MED ORDER — BUPIVACAINE HCL (PF) 0.5 % IJ SOLN
INTRAMUSCULAR | Status: DC | PRN
  Administered 2023-06-04: 10 mL

## 2023-06-04 MED ORDER — METOPROLOL TARTRATE 5 MG/5ML IV SOLN: 5.0000 mg | INTRAVENOUS | Status: DC | PRN

## 2023-06-04 MED ORDER — ONDANSETRON HCL 4 MG/2ML IJ SOLN
INTRAMUSCULAR | Status: AC
  Filled 2023-06-04: qty 2

## 2023-06-04 MED ORDER — DEXAMETHASONE SODIUM PHOSPHATE 10 MG/ML IJ SOLN
INTRAMUSCULAR | Status: AC
  Filled 2023-06-04: qty 1

## 2023-06-04 MED ORDER — MIDAZOLAM HCL 2 MG/2ML IJ SOLN: 2.0000 mg | Freq: Once | INTRAMUSCULAR | Status: AC

## 2023-06-04 MED ORDER — OXYCODONE HCL 5 MG PO TABS: 5.0000 mg | ORAL_TABLET | Freq: Once | ORAL | Status: DC | PRN

## 2023-06-04 MED ORDER — LIDOCAINE 2% (20 MG/ML) 5 ML SYRINGE
INTRAMUSCULAR | Status: DC | PRN
  Administered 2023-06-04: 40 mg via INTRAVENOUS

## 2023-06-04 MED ORDER — CEFAZOLIN SODIUM-DEXTROSE 2-4 GM/100ML-% IV SOLN
2.0000 g | INTRAVENOUS | Status: AC
  Administered 2023-06-04: 2 g via INTRAVENOUS

## 2023-06-04 MED ORDER — PROPOFOL 10 MG/ML IV BOLUS
INTRAVENOUS | Status: DC | PRN
  Administered 2023-06-04: 130 mg via INTRAVENOUS

## 2023-06-04 MED ORDER — FENTANYL CITRATE PF 50 MCG/ML IJ SOSY: 100.0000 ug | PREFILLED_SYRINGE | Freq: Once | INTRAMUSCULAR | Status: AC

## 2023-06-04 MED ORDER — ONDANSETRON HCL 4 MG/2ML IJ SOLN: 4.0000 mg | Freq: Four times a day (QID) | INTRAMUSCULAR | Status: DC | PRN

## 2023-06-04 MED ORDER — PHENYLEPHRINE HCL-NACL 20-0.9 MG/250ML-% IV SOLN
INTRAVENOUS | Status: AC
  Filled 2023-06-04: qty 250

## 2023-06-04 MED ORDER — ACETAMINOPHEN 160 MG/5ML PO SOLN: 325.0000 mg | ORAL | Status: DC | PRN

## 2023-06-04 MED ORDER — ROPIVACAINE HCL 5 MG/ML IJ SOLN
INTRAMUSCULAR | Status: DC | PRN
  Administered 2023-06-04: 15 mL via PERINEURAL

## 2023-06-04 MED ORDER — DEXAMETHASONE SODIUM PHOSPHATE 10 MG/ML IJ SOLN
INTRAMUSCULAR | Status: DC | PRN
  Administered 2023-06-04: 4 mg via INTRAVENOUS

## 2023-06-04 MED ORDER — HYDRALAZINE HCL 20 MG/ML IJ SOLN: 10.0000 mg | INTRAMUSCULAR | Status: DC | PRN

## 2023-06-04 MED ORDER — CHLORHEXIDINE GLUCONATE 4 % EX SOLN: 60.0000 mL | Freq: Once | CUTANEOUS | Status: DC

## 2023-06-04 MED ORDER — PROPOFOL 10 MG/ML IV BOLUS
INTRAVENOUS | Status: AC
  Filled 2023-06-04: qty 20

## 2023-06-04 MED ORDER — LACTATED RINGERS IV SOLN: INTRAVENOUS | Status: DC | PRN

## 2023-06-04 MED ORDER — ONDANSETRON HCL 4 MG/2ML IJ SOLN: 4.0000 mg | Freq: Once | INTRAMUSCULAR | Status: DC | PRN

## 2023-06-04 MED ORDER — BUPIVACAINE LIPOSOME 1.3 % IJ SUSP
INTRAMUSCULAR | Status: DC | PRN
  Administered 2023-06-04: 10 mL via PERINEURAL

## 2023-06-04 MED ORDER — GUAIFENESIN 100 MG/5ML PO LIQD: 5.0000 mL | ORAL | Status: DC | PRN

## 2023-06-04 MED ORDER — CEFAZOLIN SODIUM-DEXTROSE 2-4 GM/100ML-% IV SOLN
INTRAVENOUS | Status: AC
  Filled 2023-06-04: qty 100

## 2023-06-04 MED ORDER — MEPERIDINE HCL 50 MG/ML IJ SOLN: 6.2500 mg | INTRAMUSCULAR | Status: DC | PRN

## 2023-06-04 MED ORDER — FENTANYL CITRATE PF 50 MCG/ML IJ SOSY: 25.0000 ug | PREFILLED_SYRINGE | INTRAMUSCULAR | Status: DC | PRN

## 2023-06-04 MED ORDER — CELECOXIB 200 MG PO CAPS: 200.0000 mg | ORAL_CAPSULE | Freq: Once | ORAL | Status: DC

## 2023-06-04 MED ORDER — ACETAMINOPHEN 325 MG PO TABS: 325.0000 mg | ORAL_TABLET | ORAL | Status: DC | PRN

## 2023-06-04 MED ORDER — ENOXAPARIN SODIUM 40 MG/0.4ML IJ SOSY
40.0000 mg | PREFILLED_SYRINGE | INTRAMUSCULAR | Status: DC
  Administered 2023-06-06 – 2023-06-13 (×8): 40 mg via SUBCUTANEOUS
  Filled 2023-06-04 (×8): qty 0.4

## 2023-06-04 SURGICAL SUPPLY — 66 items
APL PRP STRL LF DISP 70% ISPRP (MISCELLANEOUS) ×4
BAG COUNTER SPONGE SURGICOUNT (BAG) IMPLANT
BAG SPEC THK2 15X12 ZIP CLS (MISCELLANEOUS) ×2
BAG SPNG CNTER NS LX DISP (BAG)
BAG ZIPLOCK 12X15 (MISCELLANEOUS) ×2 IMPLANT
BANDAGE ESMARK 6X9 LF (GAUZE/BANDAGES/DRESSINGS) ×2 IMPLANT
BINDER ABDOMINAL 12 ML 46-62 (SOFTGOODS) IMPLANT
BIT DRILL 2.5X2.75 QC CALB (BIT) IMPLANT
BIT DRILL 2.9 CANN QC NONSTRL (BIT) IMPLANT
BLADE SURG 15 STRL LF DISP TIS (BLADE) ×4 IMPLANT
BLADE SURG 15 STRL SS (BLADE) ×4
BNDG CMPR 5X4 KNIT ELC UNQ LF (GAUZE/BANDAGES/DRESSINGS) ×2
BNDG CMPR 5X6 CHSV STRCH STRL (GAUZE/BANDAGES/DRESSINGS) ×2
BNDG CMPR 6 X 5 YARDS HK CLSR (GAUZE/BANDAGES/DRESSINGS) ×2
BNDG CMPR 9X6 STRL LF SNTH (GAUZE/BANDAGES/DRESSINGS) ×2
BNDG CMPR MED 10X6 ELC LF (GAUZE/BANDAGES/DRESSINGS) ×2
BNDG COHESIVE 6X5 TAN ST LF (GAUZE/BANDAGES/DRESSINGS) ×2 IMPLANT
BNDG ELASTIC 4INX 5YD STR LF (GAUZE/BANDAGES/DRESSINGS) ×2 IMPLANT
BNDG ELASTIC 6INX 5YD STR LF (GAUZE/BANDAGES/DRESSINGS) ×2 IMPLANT
BNDG ELASTIC 6X10 VLCR STRL LF (GAUZE/BANDAGES/DRESSINGS) IMPLANT
BNDG ESMARK 6X9 LF (GAUZE/BANDAGES/DRESSINGS) ×2
BNDG GAUZE DERMACEA FLUFF 4 (GAUZE/BANDAGES/DRESSINGS) ×2 IMPLANT
BNDG GZE DERMACEA 4 6PLY (GAUZE/BANDAGES/DRESSINGS) ×2
CHLORAPREP W/TINT 26 (MISCELLANEOUS) ×4 IMPLANT
COVER SURGICAL LIGHT HANDLE (MISCELLANEOUS) ×2 IMPLANT
CUFF TOURN SGL QUICK 34 (TOURNIQUET CUFF) ×2
CUFF TRNQT CYL 34X4.125X (TOURNIQUET CUFF) ×2 IMPLANT
DRAPE C-ARM 42X120 X-RAY (DRAPES) IMPLANT
DRAPE C-ARMOR (DRAPES) ×2 IMPLANT
DRAPE EXTREMITY T 121X128X90 (DISPOSABLE) ×2 IMPLANT
DRAPE U-SHAPE 47X51 STRL (DRAPES) ×2 IMPLANT
DRSG EMULSION OIL 3X16 NADH (GAUZE/BANDAGES/DRESSINGS) IMPLANT
ELECT REM PT RETURN 15FT ADLT (MISCELLANEOUS) ×2 IMPLANT
GAUZE 4X4 16PLY ~~LOC~~+RFID DBL (SPONGE) ×2 IMPLANT
GAUZE PAD ABD 8X10 STRL (GAUZE/BANDAGES/DRESSINGS) ×10 IMPLANT
GAUZE SPONGE 4X4 12PLY STRL (GAUZE/BANDAGES/DRESSINGS) ×4 IMPLANT
GAUZE XEROFORM 1X8 LF (GAUZE/BANDAGES/DRESSINGS) ×2 IMPLANT
GLOVE BIOGEL PI IND STRL 7.5 (GLOVE) ×4 IMPLANT
GLOVE INDICATOR 7.5 STRL GRN (GLOVE) ×2 IMPLANT
GOWN STRL REUS W/ TWL LRG LVL3 (GOWN DISPOSABLE) ×2 IMPLANT
GOWN STRL REUS W/TWL LRG LVL3 (GOWN DISPOSABLE) ×2
K-WIRE ACE 1.6X6 (WIRE) ×8
KIT BASIN OR (CUSTOM PROCEDURE TRAY) ×2 IMPLANT
KIT TURNOVER KIT A (KITS) IMPLANT
KWIRE ACE 1.6X6 (WIRE) IMPLANT
NS IRRIG 1000ML POUR BTL (IV SOLUTION) ×2 IMPLANT
PACK TOTAL JOINT (CUSTOM PROCEDURE TRAY) ×2 IMPLANT
PLATE LOCK 8H 103 BILAT FIB (Plate) IMPLANT
PROTECTOR NERVE ULNAR (MISCELLANEOUS) ×2 IMPLANT
SCREW ACE CAN 4.0 42M (Screw) IMPLANT
SCREW ACE CAN 4.0 48M (Screw) IMPLANT
SCREW LOCK 3.5X12 DIST TIB (Screw) IMPLANT
SCREW LOCK 3.5X14 DIST TIB (Screw) IMPLANT
SCREW LOCK 3.5X40 DIST TIB (Screw) IMPLANT
SCREW LOCK 3.5X46 DIST TIB (Screw) ×2 IMPLANT
SCREW LOCK CORT STAR 3.5X14 (Screw) IMPLANT
SCREW LOW PROFILE 12MMX3.5MM (Screw) IMPLANT
SCREW LP 3.5 (Screw) IMPLANT
STOCKINETTE 4X48 STRL (DRAPES) ×2 IMPLANT
SUCTION TUBE FRAZIER 10FR DISP (SUCTIONS) ×2 IMPLANT
SUT ETHILON 3 0 PS 1 (SUTURE) ×4 IMPLANT
SUT VIC AB 2-0 SH 27 (SUTURE) ×2
SUT VIC AB 2-0 SH 27XBRD (SUTURE) ×2 IMPLANT
SUT VIC AB 3-0 SH 27 (SUTURE) ×4
SUT VIC AB 3-0 SH 27X BRD (SUTURE) ×4 IMPLANT
WATER STERILE IRR 1000ML POUR (IV SOLUTION) ×2 IMPLANT

## 2023-06-04 NOTE — Care Plan (Signed)
Orthopaedic Surgery Plan of Care Note   -history and imaging reviewed -pt has displaced left trimalleolar ankle fx -will need ORIF vs ex fix -please keep NPO and hold VTE ppx for possible OR 7/31 PM, timing TBD pending OR control desk update   Netta Cedars, MD Orthopaedic Surgery [facility]

## 2023-06-04 NOTE — TOC Progression Note (Signed)
Transition of Care [facility]) - Progression Note    Patient Details  Name: Haley White MRN: [id number] Date of Birth: 1956/10/30  Transition [facility] Care [facility]) CM/SW Contact  Janae Bridgeman, RN Phone Number: 06/04/2023, 2:22 PM  Clinical Narrative:    CM spoke with attending MD and patient is being transferred to [facility] area today for pending scheduled surgery today since [facility] OR does not have OR availability per Orthopedic surgery.  Consulting civil engineer is arranging [facility] transport to [facility] today.   Expected Discharge Plan: Home w Home Health Services Barriers to Discharge: Continued Medical Work up  Expected Discharge Plan and Services   Discharge Planning Services: CM Consult   Living arrangements for the past 2 months: Single Family Home                                       Social Determinants of Health (SDOH) Interventions SDOH Screenings   Food Insecurity: Patient Declined (06/03/2023)  Housing: Low Risk  (06/02/2023)  Transportation Needs: No Transportation Needs (06/02/2023)  Utilities: Not At Risk (06/03/2023)  Tobacco Use: Low Risk  (06/02/2023)    Readmission Risk Interventions     No data to display

## 2023-06-04 NOTE — Anesthesia Procedure Notes (Addendum)
Anesthesia Regional Block: Popliteal block   Pre-Anesthetic Checklist: , timeout performed,  Correct Patient, Correct Site, Correct Laterality,  Correct Procedure, Correct Position, site marked,  Risks and benefits discussed,  Surgical consent,  Pre-op evaluation,  At surgeon's request and post-op pain management  Laterality: Left  Prep: chloraprep       Needles:  Injection technique: Single-shot  Needle Type: Echogenic Stimulator Needle     Needle Length: 5cm  Needle Gauge: 22     Additional Needles:   Procedures:, nerve stimulator,,, ultrasound used (permanent image in chart),,     Nerve Stimulator or Paresthesia:  Response: foot, 0.45 mA  Additional Responses:   Narrative:  Start time: 06/04/2023 5:10 PM End time: 06/04/2023 5:13 PM Injection made incrementally with aspirations every 5 mL.  Performed by: Personally  Anesthesiologist: Bethena Midget, MD  Additional Notes: Functioning IV was confirmed and monitors were applied.  A 50mm 22ga Arrow echogenic stimulator needle was used. Sterile prep and drape,hand hygiene and sterile gloves were used. Ultrasound guidance: relevant anatomy identified, needle position confirmed, local anesthetic spread visualized around nerve(s)., vascular puncture avoided.  Image printed for medical record. Negative aspiration and negative test dose prior to incremental administration of local anesthetic. The patient tolerated the procedure well.

## 2023-06-04 NOTE — Anesthesia Procedure Notes (Addendum)
Procedure Name: Intubation Date/Time: 06/04/2023 7:18 PM  Performed by: Ponciano Ort, CRNAPre-anesthesia Checklist: Patient identified, Emergency Drugs available, Suction available and Patient being monitored Patient Re-evaluated:Patient Re-evaluated prior to induction Oxygen Delivery Method: Circle system utilized Preoxygenation: Pre-oxygenation with 100% oxygen Induction Type: IV induction Laryngoscope Size: Glidescope and 3 Grade View: Grade I Tube type: Oral Tube size: 7.0 mm Number of attempts: 1 Airway Equipment and Method: Stylet and Oral airway Placement Confirmation: ETT inserted through vocal cords under direct vision, positive ETCO2 and breath sounds checked- equal and bilateral Secured at: 21 cm Tube secured with: Tape Dental Injury: Teeth and Oropharynx as per pre-operative assessment

## 2023-06-04 NOTE — Anesthesia Procedure Notes (Addendum)
Anesthesia Regional Block: Adductor canal block   Pre-Anesthetic Checklist: , timeout performed,  Correct Patient, Correct Site, Correct Laterality,  Correct Procedure, Correct Position, site marked,  Risks and benefits discussed,  Surgical consent,  Pre-op evaluation,  At surgeon's request and post-op pain management  Laterality: Left  Prep: chloraprep       Needles:  Injection technique: Single-shot  Needle Type: Echogenic Stimulator Needle     Needle Length: 5cm  Needle Gauge: 22     Additional Needles:   Procedures:, nerve stimulator,,, ultrasound used (permanent image in chart),,    Narrative:  Start time: 06/04/2023 5:15 PM End time: 06/04/2023 5:18 PM Injection made incrementally with aspirations every 5 mL.  Performed by: Personally  Anesthesiologist: Bethena Midget, MD  Additional Notes: Functioning IV was confirmed and monitors were applied.  A 50mm 22ga Arrow echogenic stimulator needle was used. Sterile prep and drape,hand hygiene and sterile gloves were used. Ultrasound guidance: relevant anatomy identified, needle position confirmed, local anesthetic spread visualized around nerve(s)., vascular puncture avoided.  Image printed for medical record. Negative aspiration and negative test dose prior to incremental administration of local anesthetic. The patient tolerated the procedure well.

## 2023-06-04 NOTE — Plan of Care (Signed)

## 2023-06-04 NOTE — Transfer of Care (Signed)
Immediate Anesthesia Transfer of Care Note  Patient: Haley White  Procedure(s) Performed: OPEN REDUCTION INTERNAL FIXATION (ORIF) ANKLE FRACTURE vs EX FIX (Left: Ankle) EXTERNAL FIXATION LEG (Left)  Patient Location: PACU  Anesthesia Type:Regional and GA combined with regional for post-op pain  Level of Consciousness: awake, alert , oriented, and patient cooperative  Airway & Oxygen Therapy: Patient Spontanous Breathing and Patient connected to face mask oxygen  Post-op Assessment: Report given to RN and Post -op Vital signs reviewed and stable  Post vital signs: Reviewed and stable  Last Vitals:  Vitals Value Taken Time  BP    Temp 36.7 C 06/04/23 2103  Pulse 87 06/04/23 2105  Resp 12 06/04/23 2105  SpO2 98 % 06/04/23 2105  Vitals shown include unfiled device data.  Last Pain:  Vitals:   06/04/23 1727  TempSrc:   PainSc: 0-No pain      Patients Stated Pain Goal: 0 (06/03/23 1828)  Complications: No notable events documented.

## 2023-06-04 NOTE — Anesthesia Preprocedure Evaluation (Addendum)
Anesthesia Evaluation  Patient identified by MRN, date of birth, ID band Patient awake    Reviewed: Allergy & Precautions, H&P , NPO status , Patient's Chart, lab work & pertinent test results  Airway Mallampati: III  TM Distance: >3 FB Neck ROM: Full    Dental no notable dental hx. (+) Teeth Intact, Dental Advisory Given   Pulmonary neg pulmonary ROS   Pulmonary exam normal breath sounds clear to auscultation       Cardiovascular Exercise Tolerance: Good hypertension, Pt. on medications negative cardio ROS Normal cardiovascular exam Rhythm:Regular Rate:Normal     Neuro/Psych  PSYCHIATRIC DISORDERS  Depression    negative neurological ROS     GI/Hepatic negative GI ROS, Neg liver ROS,,,  Endo/Other  negative endocrine ROS    Renal/GU negative Renal ROS  negative genitourinary   Musculoskeletal negative musculoskeletal ROS (+) Arthritis ,    Abdominal   Peds negative pediatric ROS (+)  Hematology negative hematology ROS (+)   Anesthesia Other Findings   Reproductive/Obstetrics negative OB ROS                             Anesthesia Physical Anesthesia Plan  ASA: 2 and emergent  Anesthesia Plan: General and Regional   Post-op Pain Management: Regional block*, Minimal or no pain anticipated, Tylenol PO (pre-op)* and Celebrex PO (pre-op)*   Induction: Intravenous  PONV Risk Score and Plan: 3 and Ondansetron, Dexamethasone, Midazolam and Treatment may vary due to age or medical condition  Airway Management Planned: LMA  Additional Equipment: None  Intra-op Plan:   Post-operative Plan: Extubation in OR  Informed Consent: I have reviewed the patients History and Physical, chart, labs and discussed the procedure including the risks, benefits and alternatives for the proposed anesthesia with the patient or authorized representative who has indicated his/her understanding and  acceptance.       Plan Discussed with: Anesthesiologist and CRNA  Anesthesia Plan Comments: ( )       Anesthesia Quick Evaluation

## 2023-06-04 NOTE — Anesthesia Procedure Notes (Signed)
Procedure Name: LMA Insertion Date/Time: 06/04/2023 6:53 PM  Performed by: Ponciano Ort, CRNAPre-anesthesia Checklist: Patient identified, Emergency Drugs available, Suction available and Patient being monitored Patient Re-evaluated:Patient Re-evaluated prior to induction Oxygen Delivery Method: Circle system utilized Preoxygenation: Pre-oxygenation with 100% oxygen Induction Type: IV induction Ventilation: Mask ventilation without difficulty LMA: LMA inserted LMA Size: 4.0 Number of attempts: 1 Airway Equipment and Method: Oral airway Placement Confirmation: positive ETCO2 and breath sounds checked- equal and bilateral Tube secured with: Tape Dental Injury: Teeth and Oropharynx as per pre-operative assessment

## 2023-06-04 NOTE — Progress Notes (Signed)
PT Cancellation Note  Patient Details Name: Haley White MRN: [id number] DOB: 05-21-1956   Cancelled Treatment:    Reason Eval/Treat Not Completed: Patient declined, no reason specified (pt reports pain more controlled but declined mobility at this time, awaiting sx. will await post op orders)   [name] B [name] 06/04/2023, 11:40 AM [name], PT Acute Rehabilitation Services Office: [phone]

## 2023-06-04 NOTE — Progress Notes (Signed)
PROGRESS NOTE    Haley White  QMV:[id number] DOB: [birth year] DOA: 06/02/2023 PCP: Lucky Cowboy, MD   Brief Narrative:  67 y.o. female with with no reported significant past medical history, who is admitted to [facility] on 06/02/2023 with acute left trimalleolar ankle fracture after presenting from home to [facility] ED complaining of acute left ankle discomfort.   Admitted for pain control in the setting of left ankle fracture.  Initially no plans for surgery, eventually orthopedic planning on surgery later on 7/31 evening.   Assessment & Plan:  Principal Problem:   Trimalleolar fracture of left ankle Active Problems:   Fall at home, initial encounter   Acute left ankle pain   Ankle fracture     Acute Trimalleolar Fracture Subluxation; Left Small Acute Avulsion Fracture of the Anterolateral Tibial Plafond S/p manual reduction under conscious sedation in the ED -Initially orthopedic recommended pain control and outpatient follow-up.  Eventually planning for ORIF as the swelling reduces.   COPD? -Continue home Dulera   Depression  Anxiety -Continue home Prozac and Wellbutrin   Dyslipidemia -Statin   GERD -PPI   Hypertension -Hold lisinopril while giving toradol.  IV as needed        DVT prophylaxis: Lovenox Code Status: Full code Family Communication:   Status is: Inpatient Plans for ORIF       Diet Orders (From admission, onward)     Start     Ordered   06/04/23 0115  Diet NPO time specified  Diet effective now        06/04/23 0114            Subjective:  Left leg pain as expected.   Examination:  General exam: Appears calm and comfortable  Respiratory system: Clear to auscultation. Respiratory effort normal. Cardiovascular system: S1 & S2 heard, RRR. No JVD, murmurs, rubs, gallops or clicks. No pedal edema. Gastrointestinal system: Abdomen is nondistended, soft and nontender. No organomegaly or masses felt. Normal bowel sounds  heard. Central nervous system: Alert and oriented. No focal neurological deficits. Extremities: Symmetric 5 x 5 power. Skin: LLE dressing in place.  Psychiatry: Judgement and insight appear normal. Mood & affect appropriate.  Objective: Vitals:   06/03/23 1928 06/03/23 2030 06/04/23 0538 06/04/23 0759  BP:  (!) 101/47 118/66 125/69  Pulse: 78 84 83 80  Resp: 16 18 18 18   Temp:  97.8 F (36.6 C) 99.3 F (37.4 C) 98 F (36.7 C)  TempSrc:   Oral   SpO2: 96% 95% 100% 96%  Weight:   105.6 kg   Height:        Intake/Output Summary (Last 24 hours) at 06/04/2023 0811 Last data filed at 06/03/2023 2016 Gross per 24 hour  Intake 56.58 ml  Output 200 ml  Net -143.42 ml   Filed Weights   06/03/23 0411 06/03/23 0616 06/04/23 0538  Weight: 103.4 kg 103.4 kg 105.6 kg    Scheduled Meds:  acetaminophen  1,000 mg Oral Q8H   buPROPion  300 mg Oral Daily   enoxaparin (LOVENOX) injection  40 mg Subcutaneous Q24H   FLUoxetine  60 mg Oral Daily   ketorolac  15 mg Intravenous Q6H   mometasone-formoterol  2 puff Inhalation BID   montelukast  10 mg Oral QHS   pantoprazole  40 mg Oral Daily   rosuvastatin  40 mg Oral Daily   Continuous Infusions:  methocarbamol (ROBAXIN) IV 500 mg (06/04/23 0512)    Nutritional status     Body mass  index is 5,613.28 kg/m.  Data Reviewed:   CBC: Recent Labs  Lab 06/02/23 2001 06/03/23 0054 06/04/23 0121  WBC 9.7 9.0 7.8  NEUTROABS 5.6 5.7 5.0  HGB 11.6* 12.0 10.9*  HCT 35.1* 36.1 34.2*  MCV 95.6 98.4 98.3  PLT 255 231 219   Basic Metabolic Panel: Recent Labs  Lab 06/02/23 2001 06/03/23 0054 06/04/23 0121  NA 142 139 137  K 4.1 4.3 3.8  CL 105 106 102  CO2 23 22 24   GLUCOSE 87 115* 134*  BUN 16 18 20   CREATININE 0.96 0.98 0.95  CALCIUM 8.6* 8.6* 8.6*  MG 1.7 1.8 2.0  PHOS  --   --  4.8*   GFR: CrCl cannot be calculated (Unknown ideal weight.). Liver Function Tests: Recent Labs  Lab 06/02/23 2001 06/03/23 0054  06/04/23 0121  AST 23 22 26   ALT 27 29 25   ALKPHOS 51 56 56  BILITOT 1.1 1.3* 1.3*  PROT 5.8* 6.1* 5.8*  ALBUMIN 3.7 3.9 3.6   No results for input(s): "LIPASE", "AMYLASE" in the last 168 hours. No results for input(s): "AMMONIA" in the last 168 hours. Coagulation Profile: Recent Labs  Lab 06/03/23 0054  INR 1.0   Cardiac Enzymes: No results for input(s): "CKTOTAL", "CKMB", "CKMBINDEX", "TROPONINI" in the last 168 hours. BNP (last 3 results) No results for input(s): "PROBNP" in the last 8760 hours. HbA1C: No results for input(s): "HGBA1C" in the last 72 hours. CBG: No results for input(s): "GLUCAP" in the last 168 hours. Lipid Profile: No results for input(s): "CHOL", "HDL", "LDLCALC", "TRIG", "CHOLHDL", "LDLDIRECT" in the last 72 hours. Thyroid Function Tests: No results for input(s): "TSH", "T4TOTAL", "FREET4", "T3FREE", "THYROIDAB" in the last 72 hours. Anemia Panel: No results for input(s): "VITAMINB12", "FOLATE", "FERRITIN", "TIBC", "IRON", "RETICCTPCT" in the last 72 hours. Sepsis Labs: No results for input(s): "PROCALCITON", "LATICACIDVEN" in the last 168 hours.  Recent Results (from the past 240 hour(s))  Surgical PCR screen     Status: None   Collection Time: 06/03/23  4:37 AM   Specimen: Nasal Mucosa; Nasal Swab  Result Value Ref Range Status   MRSA, PCR NEGATIVE NEGATIVE Final   Staphylococcus aureus NEGATIVE NEGATIVE Final    Comment: (NOTE) The Xpert SA Assay (FDA approved for NASAL specimens in patients 97 years of age and older), is one component of a comprehensive surveillance program. It is not intended to diagnose infection nor to guide or monitor treatment. Performed at [facility], [address], [place], [place] [address]          Radiology Studies: CT Ankle Left Wo Contrast  Result Date: 06/02/2023 CLINICAL DATA:  Left ankle fracture. EXAM: CT OF THE LEFT ANKLE WITHOUT CONTRAST TECHNIQUE: Multidetector CT imaging of the left  ankle was performed according to the standard protocol. Multiplanar CT image reconstructions were also generated. RADIATION DOSE REDUCTION: This exam was performed according to the departmental dose-optimization program which includes automated exposure control, adjustment of the mA and/or kV according to patient size and/or use of iterative reconstruction technique. COMPARISON:  Left ankle x-rays from same day. FINDINGS: Bones/Joint/Cartilage Acute comminuted fracture involving the base of the medial malleolus with 9 mm medial displacement. Acute oblique fracture of the distal fibular metadiaphysis with 4 mm lateral displacement and mild overriding. Acute minimally displaced longitudinal fracture of the posterior malleolus. Small acute avulsion fracture of the anterolateral tibial plafond. Lateral subluxation of the talus with respect to the tibial plafond. Midfoot degenerative changes. No joint effusion.  Ligaments Ligaments are suboptimally evaluated by CT. Muscles and Tendons Grossly intact. Prominent thickening of the peroneal longus and distal Achilles tendons, likely reflecting tendinosis. Soft tissue Diffuse soft tissue swelling. No fluid collection or hematoma. No soft tissue mass. IMPRESSION: 1. Acute trimalleolar fracture-subluxation as described above. 2. Additional small acute avulsion fracture of the anterolateral tibial plafond. Electronically Signed   By: Obie Dredge M.D.   On: 06/02/2023 17:05   DG Ankle 2 Views Left  Result Date: 06/02/2023 CLINICAL DATA:  Post splinting EXAM: LEFT ANKLE - 2 VIEW COMPARISON:  Left tib-fib radiographs-earlier same day FINDINGS: Fine bony detail is degraded secondary to overlying casting material Improved alignment of previously noted comminuted trimalleolar ankle fracture with residual deformity at the fracture sites. No new fracture or dislocation. No radiopaque foreign body. IMPRESSION: Improved alignment of previously noted comminuted trimalleolar ankle  fracture with residual deformity at the fracture sites. Electronically Signed   By: Simonne Come M.D.   On: 06/02/2023 14:45   DG Tibia/Fibula Left  Result Date: 06/02/2023 CLINICAL DATA:  Fall.  Pain. EXAM: LEFT ANKLE - 2 VIEW; LEFT TIBIA AND FIBULA - 2 VIEW; LEFT FOOT - 2 VIEW COMPARISON:  Left foot radiographs 04/10/2023, left ankle radiographs 10/17/2021, MRI left ankle 09/08/2021 FINDINGS: Left ankle: There is an acute, comminuted fracture of the distal fibular diaphysis with lateral angulation of the distal fibular component and up to approximately 1.2 cm lateral displacement of the distal fibular component with respect of the proximal fibular component. Approx There is associated lateral dislocation of the talus and distal fibula with respect to the tibia and proximal fibula. There is a curvilinear fracture extending through to portions of the distal medial tibial metaphyseal through the epiphyseal cortex with up to 2 mm diastasis/cortical step-off. Possible mild anterior subluxation of the talus with respect of the tibia on lateral view. -- Left foot: Mild hallux valgus. Moderate dorsal talonavicular, naviculocuneiform, and tarsometatarsal degenerative osteophytosis with moderate joint space narrowing. Left tibia and fibula: No additional more proximal tibia or fibula acute fracture is seen. 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 IMPRESSION: 1. Acute, comminuted fracture of the distal fibular diaphysis with lateral angulation of the distal fibular component and up to approximately 1.2 cm lateral displacement of the distal fibular component with respect of the proximal fibular component. 2. Lateral dislocation of the talus and the distal fibula fracture component with respect to the tibia and proximal fibular fracture component. 3. Acute, curvilinear fracture at the distal medial aspect of the tibia with up to 2 mm diastasis/cortical step-off. 4. Possible mild anterior  subluxation of the talus with respect of the tibia on lateral view. 5. Moderate midfoot osteoarthritis. Electronically Signed   By: Neita Garnet M.D.   On: 06/02/2023 13:14   DG Ankle 2 Views Left  Result Date: 06/02/2023 CLINICAL DATA:  Fall.  Pain. EXAM: LEFT ANKLE - 2 VIEW; LEFT TIBIA AND FIBULA - 2 VIEW; LEFT FOOT - 2 VIEW COMPARISON:  Left foot radiographs 04/10/2023, left ankle radiographs 10/17/2021, MRI left ankle 09/08/2021 FINDINGS: Left ankle: There is an acute, comminuted fracture of the distal fibular diaphysis with lateral angulation of the distal fibular component and up to approximately 1.2 cm lateral displacement of the distal fibular component with respect of the proximal fibular component. Approx There is associated lateral dislocation of the talus and distal fibula with respect to the tibia and proximal fibula. There is a curvilinear fracture extending through to portions of the  distal medial tibial metaphyseal through the epiphyseal cortex with up to 2 mm diastasis/cortical step-off. Possible mild anterior subluxation of the talus with respect of the tibia on lateral view. -- Left foot: Mild hallux valgus. Moderate dorsal talonavicular, naviculocuneiform, and tarsometatarsal degenerative osteophytosis with moderate joint space narrowing. Left tibia and fibula: No additional more proximal tibia or fibula acute fracture is seen. 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 IMPRESSION: 1. Acute, comminuted fracture of the distal fibular diaphysis with lateral angulation of the distal fibular component and up to approximately 1.2 cm lateral displacement of the distal fibular component with respect of the proximal fibular component. 2. Lateral dislocation of the talus and the distal fibula fracture component with respect to the tibia and proximal fibular fracture component. 3. Acute, curvilinear fracture at the distal medial aspect of the tibia with up to 2 mm  diastasis/cortical step-off. 4. Possible mild anterior subluxation of the talus with respect of the tibia on lateral view. 5. Moderate midfoot osteoarthritis. Electronically Signed   By: Neita Garnet M.D.   On: 06/02/2023 13:14   DG Foot 2 Views Left  Result Date: 06/02/2023 CLINICAL DATA:  Fall.  Pain. EXAM: LEFT ANKLE - 2 VIEW; LEFT TIBIA AND FIBULA - 2 VIEW; LEFT FOOT - 2 VIEW COMPARISON:  Left foot radiographs 04/10/2023, left ankle radiographs 10/17/2021, MRI left ankle 09/08/2021 FINDINGS: Left ankle: There is an acute, comminuted fracture of the distal fibular diaphysis with lateral angulation of the distal fibular component and up to approximately 1.2 cm lateral displacement of the distal fibular component with respect of the proximal fibular component. Approx There is associated lateral dislocation of the talus and distal fibula with respect to the tibia and proximal fibula. There is a curvilinear fracture extending through to portions of the distal medial tibial metaphyseal through the epiphyseal cortex with up to 2 mm diastasis/cortical step-off. Possible mild anterior subluxation of the talus with respect of the tibia on lateral view. -- Left foot: Mild hallux valgus. Moderate dorsal talonavicular, naviculocuneiform, and tarsometatarsal degenerative osteophytosis with moderate joint space narrowing. Left tibia and fibula: No additional more proximal tibia or fibula acute fracture is seen. 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 IMPRESSION: 1. Acute, comminuted fracture of the distal fibular diaphysis with lateral angulation of the distal fibular component and up to approximately 1.2 cm lateral displacement of the distal fibular component with respect of the proximal fibular component. 2. Lateral dislocation of the talus and the distal fibula fracture component with respect to the tibia and proximal fibular fracture component. 3. Acute, curvilinear fracture at the  distal medial aspect of the tibia with up to 2 mm diastasis/cortical step-off. 4. Possible mild anterior subluxation of the talus with respect of the tibia on lateral view. 5. Moderate midfoot osteoarthritis. Electronically Signed   By: Neita Garnet M.D.   On: 06/02/2023 13:14           LOS: 1 day   Time spent= 35 mins    Kara Mierzejewski Joline Maxcy, MD [facility]  If 7PM-7AM, please contact night-coverage  06/04/2023, 8:11 AM

## 2023-06-04 NOTE — H&P (Signed)

## 2023-06-05 DIAGNOSIS — S82852B Displaced trimalleolar fracture of left lower leg, initial encounter for open fracture type I or II: Secondary | ICD-10-CM

## 2023-06-05 LAB — GLUCOSE, CAPILLARY
Glucose-Capillary: 116 mg/dL — ABNORMAL HIGH (ref 70–99)
Glucose-Capillary: 130 mg/dL — ABNORMAL HIGH (ref 70–99)

## 2023-06-05 MED ORDER — CHLORHEXIDINE GLUCONATE CLOTH 2 % EX PADS
6.0000 | MEDICATED_PAD | Freq: Every day | CUTANEOUS | Status: DC
  Administered 2023-06-05 – 2023-06-09 (×5): 6 via TOPICAL

## 2023-06-05 MED ORDER — POLYETHYLENE GLYCOL 3350 17 G PO PACK
17.0000 g | PACK | Freq: Two times a day (BID) | ORAL | Status: DC
  Administered 2023-06-05 – 2023-06-13 (×13): 17 g via ORAL
  Filled 2023-06-05 (×17): qty 1

## 2023-06-05 MED ORDER — CEFAZOLIN SODIUM-DEXTROSE 1-4 GM/50ML-% IV SOLN
1.0000 g | Freq: Three times a day (TID) | INTRAVENOUS | Status: AC
  Administered 2023-06-05 (×2): 1 g via INTRAVENOUS
  Filled 2023-06-05 (×2): qty 50

## 2023-06-05 NOTE — Progress Notes (Signed)
Occupational Therapy Re-Evaluation Patient Details Name: Haley White MRN: [id number] DOB: [birth year] Today's Date: 06/05/2023   History of present illness 67 y/o female adm 7/29 after fall with Lt trimalleolar ankle fracture. 7/31 ORIF. NWB. No significant PMhx   OT comments  Pt seen for first OT session s/p sx with nerve block still in effect for LLE. Pt without pain but remains very anxious with mobility and pain anticipation, requiring frequent cues for breathing techniques. Overall, pt requires Mod A x 2 for standing with RW, unable to safely pivot today so opted for scoot transfer to drop arm recliner with Min A x 2. Able to obtain further home info/DC support info today w/ pt confirming home is not w/c accessible and does not have access to consistent support at DC. Patient will benefit from continued inpatient follow up therapy, <3 hours/day based on these barriers.   Recommendations for follow up therapy are one component of a multi-disciplinary discharge planning process, led by the attending physician.  Recommendations may be updated based on patient status, additional functional criteria and insurance authorization.    Assistance Recommended at Discharge Frequent or constant Supervision/Assistance  Patient can return home with the following  A lot of help with walking and/or transfers;A lot of help with bathing/dressing/bathroom   Equipment Recommendations  Wheelchair cushion (measurements OT);Wheelchair (measurements OT) (TBD pending progress)    Recommendations for Other Services      Precautions / Restrictions Precautions Precautions: Fall;Other (comment) Precaution Comments: watch O2, anxious Restrictions Weight Bearing Restrictions: Yes LLE Weight Bearing: Non weight bearing       Mobility Bed Mobility Overal bed mobility: Needs Assistance Bed Mobility: Supine to Sit     Supine to sit: Min guard, HOB elevated     General bed mobility comments: HOB 30 degrees with  pt able to use rail, HHA and cues to pivot to EOB. Pt anxious and shaking as closer to EOB due to fear of pain. Increased time to calm and cue with pt able to scoot hips to EOB without physical assist, guarding LLE throughout    Transfers Overall transfer level: Needs assistance Equipment used: Rolling walker (2 wheels) Transfers: Sit to/from Stand, Bed to chair/wheelchair/BSC Sit to Stand: Mod assist, +2 physical assistance          Lateral/Scoot Transfers: Min assist, +2 safety/equipment General transfer comment: LLE supported on P.T. foot with standing attempt to monitor NWB. Pt cued for sequence and able to rise and clear sacrum but increased anxiety and shaking with standing with pt unable to progress to pivot or hopping and returned to bed. pt then performed lateral scoot to drop arm recliner toward left with min +2 assist guarding LLE and mod cues for sequence and to decrease anxiety.     Balance Overall balance assessment: Needs assistance Sitting-balance support: No upper extremity supported, Feet unsupported Sitting balance-Leahy Scale: Fair                                     ADL either performed or assessed with clinical judgement   ADL Overall ADL's : Needs assistance/impaired                                       General ADL Comments: Focus on initial OOB attempts s/p sx    Extremity/Trunk Assessment Upper  Extremity Assessment Upper Extremity Assessment: Overall WFL for tasks assessed   Lower Extremity Assessment Lower Extremity Assessment: Defer to PT evaluation LLE Deficits / Details: splint intact with pt able to lift leg from surface, grossly 3/5 did not formally assess   Cervical / Trunk Assessment Cervical / Trunk Assessment: Normal    Vision   Vision Assessment?: No apparent visual deficits   Perception     Praxis      Cognition Arousal/Alertness: Awake/alert Behavior During Therapy: Anxious Overall Cognitive  Status: Within Functional Limits for tasks assessed                                 General Comments: pt have difficulty with Rt vs Lt, slow processing at times, anxious due to fear of pain with shaking needing cues to calm and take deep breaths        Exercises      Shoulder Instructions       General Comments Daughter, Romie Minus intermittently present during session. Confirmed pt has BSC, crutches, knee scooter at home and home is not w/c accessible    Pertinent Vitals/ Pain       Pain Assessment Pain Assessment: No/denies pain  Home Living Family/patient expects to be discharged to:: Private residence Living Arrangements: Alone Available Help at Discharge: Family;Available 24 hours/day Type of Home: House Home Access: Stairs to enter [employer] of Steps: 3   Home Layout: One level               Home Equipment: Crutches;Other (comment);BSC/3in1 (knee scooter)          Prior Functioning/Environment              Frequency  Min 1X/week        Progress Toward Goals  OT Goals(current goals can now be found in the care plan section)  Progress towards OT goals: Progressing toward goals  Acute Rehab OT Goals Patient Stated Goal: not have pain like i did OT Goal Formulation: With patient Time For Goal Achievement: 06/17/23 Potential to Achieve Goals: Good ADL Goals Pt Will Perform Lower Body Bathing: sit to/from stand;sitting/lateral leans;with min assist Pt Will Perform Lower Body Dressing: with min assist;sit to/from stand;sitting/lateral leans Pt Will Transfer to Toilet: with min assist;stand pivot transfer;bedside commode Pt Will Perform Toileting - Clothing Manipulation and hygiene: with min assist;sit to/from stand;sitting/lateral leans  Plan Discharge plan remains appropriate    Co-evaluation    PT/OT/SLP Co-Evaluation/Treatment: Yes Reason for Co-Treatment: Complexity of the patient's impairments (multi-system  involvement);For patient/therapist safety PT goals addressed during session: Mobility/safety with mobility;Proper use of DME OT goals addressed during session: ADL's and self-care;Proper use of Adaptive equipment and DME      AM-PAC OT "6 Clicks" Daily Activity     Outcome Measure   Help from another person eating meals?: None Help from another person taking care of personal grooming?: A Little Help from another person toileting, which includes using toliet, bedpan, or urinal?: A Lot Help from another person bathing (including washing, rinsing, drying)?: A Lot Help from another person to put on and taking off regular upper body clothing?: A Little Help from another person to put on and taking off regular lower body clothing?: A Lot 6 Click Score: 16    End of Session Equipment Utilized During Treatment: Gait belt  OT Visit Diagnosis: Unsteadiness on feet (R26.81);Other abnormalities of gait and mobility (R26.89);Muscle weakness (generalized) (M62.81)  Activity Tolerance Patient tolerated treatment well;Other (comment) (limited by anxiety)   Patient Left in chair;with call bell/phone within reach;with chair alarm set;with family/visitor present   Nurse Communication Mobility status;Weight bearing status        Time: 4098-1191 OT Time Calculation (min): 33 min  Charges: OT Evaluation $OT Re-eval: 1 Re-eval OT Treatments $Therapeutic Activity: 8-22 mins  Bradd Canary, OTR/L Acute Rehab Services Office: [phone]   Lorre Munroe 06/05/2023, 12:44 PM

## 2023-06-05 NOTE — TOC Progression Note (Signed)
Transition of Care [facility]) - Progression Note    Patient Details  Name: Haley White MRN: [id number] Date of Birth: [birth year]  Transition of Care [facility]) CM/SW Contact  Janae Bridgeman, RN Phone Number: 06/05/2023, 4:46 PM  Clinical Narrative:    CM met with the patient at the bedside to discuss SNF placement and the patient was agreeable to SNF work up.  CM will start SNF workup in the am and fax the patient out in the hub for available bed offers.   Expected Discharge Plan: Home w Home Health Services Barriers to Discharge: Continued Medical Work up  Expected Discharge Plan and Services   Discharge Planning Services: CM Consult   Living arrangements for the past 2 months: Single Family Home                                       Social Determinants of Health (SDOH) Interventions SDOH Screenings   Food Insecurity: Patient Declined (06/03/2023)  Housing: Low Risk  (06/02/2023)  Transportation Needs: No Transportation Needs (06/02/2023)  Utilities: Not At Risk (06/03/2023)  Tobacco Use: Low Risk  (06/04/2023)    Readmission Risk Interventions     No data to display

## 2023-06-05 NOTE — Progress Notes (Signed)
PROGRESS NOTE    Haley White  XBM:[id number] DOB: 1956-01-11 DOA: 06/02/2023 PCP: Lucky Cowboy, MD   Brief Narrative:  67 y.o. female with with no reported significant past medical history, who is admitted to [facility] on 06/02/2023 with acute left trimalleolar ankle fracture after presenting from home to [facility] ED complaining of acute left ankle discomfort.   Admitted for pain control in the setting of left ankle fracture.  Initially no plans for surgery, eventually underwent ORIF on 7/31.   Assessment & Plan:  Principal Problem:   Trimalleolar fracture of left ankle Active Problems:   Fall at home, initial encounter   Acute left ankle pain   Ankle fracture     Acute Trimalleolar Fracture Subluxation; Left Small Acute Avulsion Fracture of the Anterolateral Tibial Plafond S/p manual reduction under conscious sedation in the ED -Initially orthopedic recommended pain control and outpatient follow-up.  Eventually underwent ORIF on 7/31.  Patient tolerated this procedure well.  PT/OT.  Supportive care.  Pain control.   COPD? -Continue home Dulera   Depression  Anxiety -Continue home Prozac and Wellbutrin   Dyslipidemia -Statin   GERD -PPI   Hypertension -Hold lisinopril while giving toradol.  IV as needed        DVT prophylaxis: Lovenox Code Status: Full code Family Communication:   Status is: Inpatient Working with PT/OT.  Will likely need rehab.       Diet Orders (From admission, onward)     Start     Ordered   06/05/23 0272  Diet 2 gram sodium Room service appropriate? Yes; Fluid consistency: Thin  Diet effective now       Question Answer Comment  Room service appropriate? Yes   Fluid consistency: Thin      06/05/23 0832            Subjective:  Leg pain at the surgical site as expected  Examination:  General exam: Appears calm and comfortable  Respiratory system: Clear to auscultation. Respiratory effort normal. Cardiovascular  system: S1 & S2 heard, RRR. No JVD, murmurs, rubs, gallops or clicks. No pedal edema. Gastrointestinal system: Abdomen is nondistended, soft and nontender. No organomegaly or masses felt. Normal bowel sounds heard. Central nervous system: Alert and oriented. No focal neurological deficits. Extremities: Symmetric 5 x 5 power. Skin: L left lower extremity dressing in place Psychiatry: Judgement and insight appear normal. Mood & affect appropriate.  Objective: Vitals:   06/05/23 1007 06/05/23 1200 06/05/23 1219 06/05/23 1219  BP:  126/82  122/81  Pulse: 88 87  88  Resp:      Temp:  99.7 F (37.6 C)    TempSrc:  Oral    SpO2: 100%  90% 98%  Weight:      Height:        Intake/Output Summary (Last 24 hours) at 06/05/2023 1227 Last data filed at 06/05/2023 0655 Gross per 24 hour  Intake 1910 ml  Output 470 ml  Net 1440 ml   Filed Weights   06/03/23 0616 06/04/23 0538 06/04/23 1727  Weight: 103.4 kg 105.6 kg 105.6 kg    Scheduled Meds:  acetaminophen  1,000 mg Oral Q8H   buPROPion  300 mg Oral Daily   Chlorhexidine Gluconate Cloth  6 each Topical Daily   [START ON 06/06/2023] enoxaparin (LOVENOX) injection  40 mg Subcutaneous Q24H   FLUoxetine  60 mg Oral Daily   ketorolac  15 mg Intravenous Q6H   mometasone-formoterol  2 puff Inhalation BID  montelukast  10 mg Oral QHS   pantoprazole  40 mg Oral Daily   rosuvastatin  40 mg Oral Daily   Continuous Infusions:   ceFAZolin (ANCEF) IV     methocarbamol (ROBAXIN) IV 500 mg (06/05/23 1146)    Nutritional status     Body mass index is 39.96 kg/m.  Data Reviewed:   CBC: Recent Labs  Lab 06/02/23 2001 06/03/23 0054 06/04/23 0121  WBC 9.7 9.0 7.8  NEUTROABS 5.6 5.7 5.0  HGB 11.6* 12.0 10.9*  HCT 35.1* 36.1 34.2*  MCV 95.6 98.4 98.3  PLT 255 231 219   Basic Metabolic Panel: Recent Labs  Lab 06/02/23 2001 06/03/23 0054 06/04/23 0121  NA 142 139 137  K 4.1 4.3 3.8  CL 105 106 102  CO2 23 22 24   GLUCOSE 87 115*  134*  BUN 16 18 20   CREATININE 0.96 0.98 0.95  CALCIUM 8.6* 8.6* 8.6*  MG 1.7 1.8 2.0  PHOS  --   --  4.8*   GFR: Estimated Creatinine Clearance: 69.1 mL/min (by C-G formula based on SCr of 0.95 mg/dL). Liver Function Tests: Recent Labs  Lab 06/02/23 2001 06/03/23 0054 06/04/23 0121  AST 23 22 26   ALT 27 29 25   ALKPHOS 51 56 56  BILITOT 1.1 1.3* 1.3*  PROT 5.8* 6.1* 5.8*  ALBUMIN 3.7 3.9 3.6   No results for input(s): "LIPASE", "AMYLASE" in the last 168 hours. No results for input(s): "AMMONIA" in the last 168 hours. Coagulation Profile: Recent Labs  Lab 06/03/23 0054  INR 1.0   Cardiac Enzymes: No results for input(s): "CKTOTAL", "CKMB", "CKMBINDEX", "TROPONINI" in the last 168 hours. BNP (last 3 results) No results for input(s): "PROBNP" in the last 8760 hours. HbA1C: No results for input(s): "HGBA1C" in the last 72 hours. CBG: Recent Labs  Lab 06/05/23 0903  GLUCAP 116*   Lipid Profile: No results for input(s): "CHOL", "HDL", "LDLCALC", "TRIG", "CHOLHDL", "LDLDIRECT" in the last 72 hours. Thyroid Function Tests: No results for input(s): "TSH", "T4TOTAL", "FREET4", "T3FREE", "THYROIDAB" in the last 72 hours. Anemia Panel: No results for input(s): "VITAMINB12", "FOLATE", "FERRITIN", "TIBC", "IRON", "RETICCTPCT" in the last 72 hours. Sepsis Labs: No results for input(s): "PROCALCITON", "LATICACIDVEN" in the last 168 hours.  Recent Results (from the past 240 hour(s))  Surgical PCR screen     Status: None   Collection Time: 06/03/23  4:37 AM   Specimen: Nasal Mucosa; Nasal Swab  Result Value Ref Range Status   MRSA, PCR NEGATIVE NEGATIVE Final   Staphylococcus aureus NEGATIVE NEGATIVE Final    Comment: (NOTE) The Xpert SA Assay (FDA approved for NASAL specimens in patients 26 years of age and older), is one component of a comprehensive surveillance program. It is not intended to diagnose infection nor to guide or monitor treatment. Performed at [facility], [address], [place], [place] [address]          Radiology Studies: DG Ankle Complete Left  Result Date: 06/05/2023 CLINICAL DATA:  Ankle surgery EXAM: LEFT ANKLE COMPLETE - 3+ VIEW COMPARISON:  06/02/2023 FINDINGS: Six low resolution intraoperative spot views of the left ankle. Total fluoroscopy time was 2 minutes 52 seconds, fluoroscopic dose of 4 mGy. Images demonstrate trimalleolar fracture with lateral subluxation of the talus. Subsequent surgical plate and fixating screws at the distal fibula and tibia with near anatomic alignment. IMPRESSION: Intraoperative fluoroscopic assistance provided during left ankle surgery. Electronically Signed   By: Jasmine Pang M.D.   On:  06/05/2023 00:18   DG C-Arm 1-60 Min-No Report  Result Date: 06/04/2023 Fluoroscopy was utilized by the requesting physician.  No radiographic interpretation.   DG C-Arm 1-60 Min-No Report  Result Date: 06/04/2023 Fluoroscopy was utilized by the requesting physician.  No radiographic interpretation.           LOS: 2 days   Time spent= 35 mins    Tacey Dimaggio Joline Maxcy, MD [facility]  If 7PM-7AM, please contact night-coverage  06/05/2023, 12:27 PM

## 2023-06-05 NOTE — OR Nursing (Signed)
Patient was transferred yesterday afternoon (7/31) to [facility] via [place] form her inpatient room at [facility] [phone]). Several hours after patient was transported back to [facility] her belongings bag with a cellphone and apple watch were found in a PACU locker. RN called [facility] at [facility] to inform her nurse that her belongings were discovered and would be returned to her today. I spoke on the phone with the patient's daughter and emergency contact, Kiswana Postiglione, who stated she would come by the hospital today to pick them up on her way to visit her mom. Georgeanna Harrison, RN ( PACU charge nurse today) is aware the belongings will be picked up.

## 2023-06-05 NOTE — Op Note (Signed)
06/04/2023  10:15 PM   PATIENT: Haley White  67 y.o. female  MRN: [id number]   PRE-OPERATIVE DIAGNOSIS:   Left displaced comminuted trimalleolar ankle fracture   POST-OPERATIVE DIAGNOSIS:   Same   PROCEDURE: 1] Open reduction internal fixation of left trimalleolar ankle fracture without fixation of posterior malleolus 2] Open reduction internal fixation of left ankle syndesmosis   SURGEON:  Netta Cedars, MD   ASSISTANT: None   ANESTHESIA: General, regional   EBL: Minimal   TOURNIQUET:    Total Tourniquet Time Documented: Thigh (Left) - 77 minutes Total: Thigh (Left) - 77 minutes    COMPLICATIONS: None apparent   DISPOSITION: Extubated, awake and stable to recovery.   INDICATION FOR PROCEDURE: The patient presented with above diagnosis.  We discussed the diagnosis, alternative treatment options, risks and benefits of the above surgical intervention, as well as alternative non-operative treatments. All questions/concerns were addressed and the patient/family demonstrated appropriate understanding of the diagnosis, the procedure, the postoperative course, and overall prognosis. The patient wished to proceed with surgical intervention and signed an informed surgical consent as such, in each others presence prior to surgery.   PROCEDURE IN DETAIL: After preoperative consent was obtained and the correct operative site was identified, the patient was brought to the operating room supine on stretcher and transferred onto operating table. General anesthesia was induced. Preoperative antibiotics were administered. Surgical timeout was taken. The patient was then positioned supine with an ipsilateral hip bump. The operative lower extremity was prepped and draped in standard sterile fashion with a tourniquet around the thigh. The extremity was exsanguinated and the tourniquet was inflated to 275 mmHg.  A standard lateral incision was made over the distal fibula.  Dissection was carried down to the level of the fibula and the fracture site identified. The superficial peroneal nerve was identified and protected throughout the procedure. The fibula was noted to be shortened with interposed periosteum. The posterior malleolus fracture was openly reduced through the fibula fracture. The fibula was brought out to length. The fibula fracture was debrided and the edges defined to achieve cortical read. Reduction maneuver was performed using pointed reduction forceps and lobster forceps. In this manner, the fibula length was restored and fracture reduced. A lag screw was not placed given the orientation of fracture lines and comminution. Due to poor bone quality and extensive comminution at the fracture site, it was decided to use a locking distal fibula plate. We then selected a Zimmer locking plate to match the anatomy of the distal fibula and placed it laterally. This was implanted under intraoperative fluoroscopy with a combination of distal locking screws and proximal cortical & locking screws.  We then turned to the medial malleolar fracture. After obtaining reduction under fluoroscopy, a Kirschner wire was placed to secure this reduction and to serve as guide for a cannulated screw. We then made an incision around the wire and overdrilled this with a cannulated drill. We then placed a 4.0 mm Zimmer [employer] partially threaded lag screw. This screw was noted to achieve excellent compression across the fracture site and also have excellent purchase. We verified position of the screw and fracture reduction in all planes with fluoroscopy. We then repeated this process for a second screw.  A manual external rotation stress radiograph was obtained and demonstrated widening of the ankle mortise. Given this intraoperative finding as well as preoperative subluxation, it was decided to reduce and fix the syndesmosis. Therefore a quadricortical was implanted through the fibula plate to  fix the syndesmosis. Screw position was verified along anteromedial tibial cortex by fluoroscopy. This process was repeated for a second screw. A repeat stress radiograph showed complete stability of the ankle mortise to testing.  The surgical sites were thoroughly irrigated. The tourniquet was deflated and hemostasis achieved. Betadine and vancomycin powder were applied. The deep layers were closed using 2-0 vicryl. The skin was closed without tension using 2-0 nylon suture and staples.    The leg was cleaned with saline and sterile dressings with gauze were applied. A well padded bulky short leg splint was applied. The patient was awakened from anesthesia and transported to the recovery room in stable condition.    FOLLOW UP PLAN: -transfer to PACU, then RNF under hospital medicine team -strict NWB operative extremity, maximum elevation -maintain short leg splint until follow up -DVT ppx: Aspirin 81 mg twice daily while NWB or per primary team preference -follow up as outpatient within 7-10 days for wound check with exchange of short leg splint to short leg cast -sutures out in 2-3 weeks in outpatient office   RADIOGRAPHS: AP, lateral, oblique and stress radiographs of the left ankle were obtained intraoperatively. These showed interval reduction and fixation of the fractures. Manual stress radiographs were taken and the ankle mortise and tibiofibular relationship were noted to be stable following fixation. All hardware is appropriately positioned and of the appropriate lengths. No other acute injuries are noted.   Netta Cedars [facility]

## 2023-06-05 NOTE — Evaluation (Signed)
Physical Therapy Evaluation Patient Details Name: Haley White MRN: [id number] DOB: 09/08/1956 Today's Date: 06/05/2023  History of Present Illness  67 y/o female adm 7/29 after fall with Lt trimalleolar ankle fracture. 7/31 ORIF. NWB. No significant PMhx  Clinical Impression  Pt pleasant, without pain and reports feeling better after sx. Pt remains anxious due to fear of return of pain particularly with mobility. Pt with sections of stairs to enter home, lives alone, and several falls with injury to LLE. Pt with decreased transfers, function, and activity tolerance who will benefit from acute therapy to maximize mobility, safety and independence to decrease burden of care. Patient will benefit from continued inpatient follow up therapy, <3 hours/day   SpO2 89% on RA supine with cues for breathing technique and able to achieve 90% supine Sitting EOB 95% on RA with desaturation to 90% with activity 1L end of session at 94% HR 93      If plan is discharge home, recommend the following: A lot of help with walking and/or transfers;A lot of help with bathing/dressing/bathroom;Assistance with cooking/housework;Assist for transportation;Help with stairs or ramp for entrance   Can travel by private vehicle   No    Equipment Recommendations Wheelchair (measurements PT);Wheelchair cushion (measurements PT)  Recommendations for Other Services       Functional Status Assessment Patient has had a recent decline in their functional status and demonstrates the ability to make significant improvements in function in a reasonable and predictable amount of time.     Precautions / Restrictions Precautions Precautions: Fall;Other (comment) Precaution Comments: watch sats, anxious Restrictions Weight Bearing Restrictions: Yes LLE Weight Bearing: Non weight bearing      Mobility  Bed Mobility Overal bed mobility: Needs Assistance Bed Mobility: Supine to Sit     Supine to sit: Min guard, HOB  elevated     General bed mobility comments: HOB 30 degrees with pt able to use rail, HHA and cues to pivot to EOB. Pt anxious and shaking as closer to EOB due to fear of pain. Increased time to calm and cue with pt able to scoot hips to EOB without physical assist, guarding LLE throughout    Transfers Overall transfer level: Needs assistance   Transfers: Sit to/from Stand, Bed to chair/wheelchair/BSC Sit to Stand: Mod assist, +2 physical assistance           General transfer comment: LLE supported on P.T. foot with standing attempt to monitor NWB. Pt cued for sequence and able to rise and clear sacrum but increased anxiety and shaking with standing with pt unable to progress to pivot or hopping and returned to bed. pt then performed lateral scoot to drop arm recliner toward left with min +2 assist guarding LLE and mod cues for sequence and to decrease anxiety.    Ambulation/Gait               General Gait Details: unable  Stairs            Wheelchair Mobility     Tilt Bed    Modified Rankin (Stroke Patients Only)       Balance Overall balance assessment: Needs assistance Sitting-balance support: No upper extremity supported, Feet unsupported Sitting balance-Leahy Scale: Fair                                       Pertinent Vitals/Pain Pain Assessment Pain Assessment: No/denies pain  Home Living Family/patient expects to be discharged to:: Private residence Living Arrangements: Alone Available Help at Discharge: Family;Available 24 hours/day Type of Home: House Home Access: Stairs to enter   [employer] of Steps: 3   Home Layout: One level Home Equipment: Crutches;Other (comment);BSC/3in1 (knee scooter)      Prior Function Prior Level of Function : Independent/Modified Independent;Driving;History of Falls (last six months)             Mobility Comments: typically no AD. does endorse 2 other falls/injuries to L LE  in past 1.5 years ADLs Comments: Indep with ADLs, IADLs, driving, walking dog when fall happened     Hand Dominance        Extremity/Trunk Assessment   Upper Extremity Assessment Upper Extremity Assessment: Defer to OT evaluation    Lower Extremity Assessment Lower Extremity Assessment: Overall WFL for tasks assessed;LLE deficits/detail LLE Deficits / Details: splint intact with pt able to lift leg from surface, grossly 3/5 did not formally assess    Cervical / Trunk Assessment Cervical / Trunk Assessment: Normal  Communication   Communication: No difficulties  Cognition Arousal/Alertness: Awake/alert Behavior During Therapy: Anxious Overall Cognitive Status: Within Functional Limits for tasks assessed                                 General Comments: pt have difficulty with Rt vs Lt, slow processing at times, anxious due to fear of pain with shaking needing cues to calm and take deep breaths        General Comments      Exercises     Assessment/Plan    PT Assessment Patient needs continued PT services  PT Problem List Decreased strength;Decreased mobility;Decreased activity tolerance;Decreased balance;Decreased knowledge of use of DME       PT Treatment Interventions DME instruction;Gait training;Functional mobility training;Balance training;Therapeutic exercise;Patient/family education;Therapeutic activities    PT Goals (Current goals can be found in the Care Plan section)  Acute Rehab PT Goals Patient Stated Goal: be able to go to [place] PT Goal Formulation: With patient/family Time For Goal Achievement: 06/19/23 Potential to Achieve Goals: Fair    Frequency Min 1X/week     Co-evaluation PT/OT/SLP Co-Evaluation/Treatment: Yes Reason for Co-Treatment: Complexity of the patient's impairments (multi-system involvement) PT goals addressed during session: Mobility/safety with mobility;Proper use of DME         AM-PAC PT "6 Clicks"  Mobility  Outcome Measure Help needed turning from your back to your side while in a flat bed without using bedrails?: A Little Help needed moving from lying on your back to sitting on the side of a flat bed without using bedrails?: A Little Help needed moving to and from a bed to a chair (including a wheelchair)?: A Lot Help needed standing up from a chair using your arms (e.g., wheelchair or bedside chair)?: A Lot Help needed to walk in hospital room?: Total Help needed climbing 3-5 steps with a railing? : Total 6 Click Score: 12    End of Session Equipment Utilized During Treatment: Gait belt Activity Tolerance: Patient tolerated treatment well Patient left: in chair;with call bell/phone within reach;with chair alarm set;with family/visitor present Nurse Communication: Mobility status;Other (comment) (lateral drop arm transfer) PT Visit Diagnosis: Other abnormalities of gait and mobility (R26.89);History of falling (Z91.81)    Time: 7829-5621 PT Time Calculation (min) (ACUTE ONLY): 34 min   Charges:   PT Evaluation $PT Eval Moderate Complexity:  1 Mod   PT General Charges $$ ACUTE PT VISIT: 1 Visit         [name], PT Acute Rehabilitation Services Office: [phone]   Cristine Polio 06/05/2023, 12:28 PM

## 2023-06-05 NOTE — Anesthesia Postprocedure Evaluation (Signed)
Anesthesia Post Note  Patient: Haley White  Procedure(s) Performed: OPEN REDUCTION INTERNAL FIXATION (ORIF) ANKLE FRACTURE vs EX FIX (Left: Ankle) EXTERNAL FIXATION LEG (Left)     Patient location during evaluation: PACU Anesthesia Type: Regional and General Level of consciousness: awake and alert Pain management: pain level controlled Vital Signs Assessment: post-procedure vital signs reviewed and stable Respiratory status: spontaneous breathing, nonlabored ventilation, respiratory function stable and patient connected to nasal cannula oxygen Cardiovascular status: blood pressure returned to baseline and stable Postop Assessment: no apparent nausea or vomiting Anesthetic complications: no   No notable events documented.  Last Vitals:  Vitals:   06/04/23 2245 06/05/23 0046  BP: 128/79 131/70  Pulse: 86 87  Resp: 18 18  Temp: 36.7 C 36.7 C  SpO2: 91% 96%    Last Pain:  Vitals:   06/05/23 0046  TempSrc: Oral  PainSc:                  Kao Berkheimer

## 2023-06-05 NOTE — Progress Notes (Signed)
PT Cancellation Note  Patient Details Name: Haley White MRN: [id number] DOB: 05-20-1956   Cancelled Treatment:    Reason Eval/Treat Not Completed: Other (comment) (await post or orders, weight bearing status and clearance for mobility)   Sarie Stall B Addalyn Speedy 06/05/2023, 7:14 AM [name], PT Acute Rehabilitation Services Office: [phone]

## 2023-06-05 NOTE — Progress Notes (Signed)
Pt placed on CPAP for the night.     06/05/23 2118  BiPAP/CPAP/SIPAP  $ Non-Invasive Home Ventilator  Subsequent  BiPAP/CPAP/SIPAP Resmed  Auto Titrate Yes  Press High Alarm 20 cmH2O  Press Low Alarm 5 cmH2O  Nasal massage performed Yes  CPAP/SIPAP surface wiped down Yes  Safety Check Completed by RT for Home Unit Yes, no issues noted  BiPAP/CPAP /SiPAP Vitals  Pulse Rate 89  Resp 18  SpO2 94 %  Bilateral Breath Sounds Clear;Diminished  MEWS Score/Color  MEWS Score 0  MEWS Score Color Haley White

## 2023-06-05 NOTE — Progress Notes (Signed)
Occupational Therapy Treatment Patient Details Name: Haley White MRN: [id number] DOB: 1956-07-29 Today's Date: 06/05/2023   History of present illness 67 y/o female adm 7/29 after fall with Lt trimalleolar ankle fracture. 7/31 ORIF. NWB. No significant PMhx   OT comments  NT requesting assist with transfer back to bed due to pt with high anxiety levels and weightbearing precautions, thus, initiating session. Education provided to pt and NT in regard to transfers toward "stronger" side, use of drop arm chair, and management of LLE during transfers. With initiation of bringing BLE down from supported position in recliner, pt with max anxious behavior and requiring external support and guidance from OT to engage in deep breathing prior to initiation of transfer. Pt able to squat pivot with light assist from OT with NT supporting LLE to maintain NWB throughout. Pt educated regarding elevation and how to reposition in bed while maintaining NWB status. Patient will benefit from continued inpatient follow up therapy, <3 hours/day.    Recommendations for follow up therapy are one component of a multi-disciplinary discharge planning process, led by the attending physician.  Recommendations may be updated based on patient status, additional functional criteria and insurance authorization.    Assistance Recommended at Discharge Frequent or constant Supervision/Assistance  Patient can return home with the following  A lot of help with walking and/or transfers;A lot of help with bathing/dressing/bathroom   Equipment Recommendations  Wheelchair cushion (measurements OT);Wheelchair (measurements OT) (TBD pending progress)    Recommendations for Other Services      Precautions / Restrictions Precautions Precautions: Fall;Other (comment) Precaution Comments: watch O2, anxious Restrictions Weight Bearing Restrictions: Yes LLE Weight Bearing: Non weight bearing       Mobility Bed Mobility Overal bed  mobility: Needs Assistance Bed Mobility: Sit to Supine       Sit to supine: Min assist   General bed mobility comments: for LLE management and repositioning    Transfers Overall transfer level: Needs assistance Equipment used: Rolling walker (2 wheels) Transfers: Sit to/from Stand, Bed to chair/wheelchair/BSC Sit to Stand: Mod assist   Squat pivot transfers: Min assist       General transfer comment: LLE supported by NT during all transfers and OT providing cueing for technique and min assist for rise and pivot     Balance Overall balance assessment: Needs assistance Sitting-balance support: No upper extremity supported, Feet unsupported Sitting balance-Leahy Scale: Fair                                     ADL either performed or assessed with clinical judgement   ADL Overall ADL's : Needs assistance/impaired                         Toilet Transfer: Minimal assistance;Squat-pivot;+2 for physical assistance Toilet Transfer Details (indicate cue type and reason): Initiated lateral scoot then pt pulling up on therapist lightly and able to pivot over to bed with +2 physical asisst as NT managing LLE to ensure maintenance of weightbearing precautions           General ADL Comments: Providing education regarding repositioning in bed without bearing weight through LLE and elevation with pillows for edema management. Pt verbalizing understanding.    Extremity/Trunk Assessment Upper Extremity Assessment Upper Extremity Assessment: Overall WFL for tasks assessed   Lower Extremity Assessment Lower Extremity Assessment: Defer to PT evaluation  Vision   Vision Assessment?: No apparent visual deficits   Perception     Praxis      Cognition Arousal/Alertness: Awake/alert Behavior During Therapy: Anxious Overall Cognitive Status: Within Functional Limits for tasks assessed                                 General Comments:  Pt with anxiety with initiation of bringing BLE down from recliner; improved ability to maintain calm mood state with cues for deep breathing prior to engagement in further mobility. Pt able to focus on therapist after this.        Exercises      Shoulder Instructions       General Comments      Pertinent Vitals/ Pain       Pain Assessment Pain Assessment: No/denies pain  Home Living                                          Prior Functioning/Environment              Frequency  Min 1X/week        Progress Toward Goals  OT Goals(current goals can now be found in the care plan section)  Progress towards OT goals: Progressing toward goals  Acute Rehab OT Goals Patient Stated Goal: no pain OT Goal Formulation: With patient Time For Goal Achievement: 06/17/23 Potential to Achieve Goals: Good ADL Goals Pt Will Perform Lower Body Bathing: sit to/from stand;sitting/lateral leans;with min assist Pt Will Perform Lower Body Dressing: with min assist;sit to/from stand;sitting/lateral leans Pt Will Transfer to Toilet: with min assist;stand pivot transfer;bedside commode Pt Will Perform Toileting - Clothing Manipulation and hygiene: with min assist;sit to/from stand;sitting/lateral leans  Plan Discharge plan remains appropriate    Co-evaluation                 AM-PAC OT "6 Clicks" Daily Activity     Outcome Measure   Help from another person eating meals?: None Help from another person taking care of personal grooming?: A Little Help from another person toileting, which includes using toliet, bedpan, or urinal?: A Lot Help from another person bathing (including washing, rinsing, drying)?: A Lot Help from another person to put on and taking off regular upper body clothing?: A Little Help from another person to put on and taking off regular lower body clothing?: A Lot 6 Click Score: 16    End of Session Equipment Utilized During Treatment: Gait  belt  OT Visit Diagnosis: Unsteadiness on feet (R26.81);Other abnormalities of gait and mobility (R26.89);Muscle weakness (generalized) (M62.81)   Activity Tolerance Patient tolerated treatment well   Patient Left with call bell/phone within reach;with family/visitor present;in bed;with bed alarm set   Nurse Communication Mobility status;Weight bearing status (entered session on NT request)        Time: 1450-1505 OT Time Calculation (min): 15 min  Charges: OT General Charges $OT Visit: 1 Visit OT Treatments $Therapeutic Activity: 8-22 mins  Tyler Deis, OTR/L [facility] Acute Rehabilitation Office: [phone]   Haley White 06/05/2023, 4:56 PM

## 2023-06-05 NOTE — Progress Notes (Signed)
Incentive spirometry introduced with teach back 

## 2023-06-05 NOTE — TOC CAGE-AID Note (Signed)
Transition of Care [facility]) - CAGE-AID Screening   Patient Details  Name: Haley White MRN: [id number] Date of Birth: [birth year]  Transition of Care [facility]) CM/SW Contact:    Katha Hamming, RN Phone Number: 06/05/2023, 7:20 PM   Clinical Narrative: Chart review, no hx of drug/alcohol abuse   CAGE-AID Screening:    Have You Ever Felt You Ought to Cut Down on Your Drinking or Drug Use?: No Have People Annoyed You By Critizing Your Drinking Or Drug Use?: No Have You Felt Bad Or Guilty About Your Drinking Or Drug Use?: No Have You Ever Had a Drink or Used Drugs First Thing In The Morning to Steady Your Nerves or to Get Rid of a Hangover?: No CAGE-AID Score: 0  Substance Abuse Education Offered: No

## 2023-06-06 ENCOUNTER — Encounter (HOSPITAL_COMMUNITY): Payer: Self-pay | Admitting: Orthopaedic Surgery

## 2023-06-06 DIAGNOSIS — S82852G Displaced trimalleolar fracture of left lower leg, subsequent encounter for closed fracture with delayed healing: Secondary | ICD-10-CM | POA: Diagnosis not present

## 2023-06-06 LAB — GLUCOSE, CAPILLARY: Glucose-Capillary: 118 mg/dL — ABNORMAL HIGH (ref 70–99)

## 2023-06-06 MED ORDER — DIPHENHYDRAMINE-ZINC ACETATE 2-0.1 % EX CREA: TOPICAL_CREAM | Freq: Once | CUTANEOUS | Status: DC | PRN

## 2023-06-06 MED ORDER — DOCUSATE SODIUM 100 MG PO CAPS
100.0000 mg | ORAL_CAPSULE | Freq: Two times a day (BID) | ORAL | Status: DC
  Administered 2023-06-06 – 2023-06-13 (×13): 100 mg via ORAL
  Filled 2023-06-06 (×15): qty 1

## 2023-06-06 MED ORDER — DIPHENHYDRAMINE-ZINC ACETATE 2-0.1 % EX CREA
TOPICAL_CREAM | Freq: Every day | CUTANEOUS | Status: DC | PRN
  Filled 2023-06-06: qty 28

## 2023-06-06 NOTE — Care Management Important Message (Signed)
Important Message  Patient Details  Name: Haley White MRN: [id number] Date of Birth: 03-Jan-1956   Medicare Important Message Given:  Yes     Dorena Bodo 06/06/2023, 2:14 PM

## 2023-06-06 NOTE — Plan of Care (Signed)

## 2023-06-06 NOTE — Progress Notes (Signed)
Physical Therapy Treatment Patient Details Name: Haley White MRN: [id number] DOB: 1956/04/17 Today's Date: 06/06/2023   History of Present Illness 67 y/o female adm 7/29 after fall with Lt trimalleolar ankle fracture. 7/31 ORIF. NWB. No significant PMhx    PT Comments  Good improvement noted today.  Less anxiety over transfer.  Emphasis on LE exercise, transitions, sit to stand and pivot to the chair    If plan is discharge home, recommend the following: A lot of help with walking and/or transfers;A little help with bathing/dressing/bathroom   Can travel by private vehicle     No  Equipment Recommendations  Wheelchair (measurements PT);Wheelchair cushion (measurements PT)    Recommendations for Other Services       Precautions / Restrictions Precautions Precautions: Fall;Other (comment) Precaution Comments: anxious Restrictions LLE Weight Bearing: Non weight bearing     Mobility  Bed Mobility Overal bed mobility: Needs Assistance Bed Mobility: Supine to Sit     Supine to sit: Min guard     General bed mobility comments: pt managed her left leg well without need for guarding    Transfers Overall transfer level: Needs assistance   Transfers: Sit to/from Stand, Bed to chair/wheelchair/BSC Sit to Stand: Min assist Stand pivot transfers: Min assist         General transfer comment: No AD used, pt stood and pivoted on R LE with minimal assist given    Ambulation/Gait                   Stairs             Wheelchair Mobility     Tilt Bed    Modified Rankin (Stroke Patients Only)       Balance     Sitting balance-Leahy Scale: Fair (to good)                                      Cognition Arousal/Alertness: Awake/alert Behavior During Therapy: Anxious Overall Cognitive Status: Within Functional Limits for tasks assessed                                          Exercises General Exercises - Lower  Extremity Heel Slides: AAROM, AROM, Left, 10 reps, Supine Hip ABduction/ADduction: AAROM, AROM, Left, 10 reps, Supine Straight Leg Raises: AROM, AAROM, Left, 10 reps, Supine    General Comments General comments (skin integrity, edema, etc.): vss      Pertinent Vitals/Pain Pain Assessment Pain Assessment: Faces Faces Pain Scale: Hurts little more (but beginning to worsen) Pain Location: L LE Pain Descriptors / Indicators: Grimacing, Guarding Pain Intervention(s): Monitored during session, Patient requesting pain meds-RN notified    Home Living                          Prior Function            PT Goals (current goals can now be found in the care plan section) Acute Rehab PT Goals PT Goal Formulation: With patient/family Time For Goal Achievement: 06/19/23 Potential to Achieve Goals: Good Progress towards PT goals: Progressing toward goals    Frequency    Min 1X/week      PT Plan Current plan remains appropriate    Co-evaluation  AM-PAC PT "6 Clicks" Mobility   Outcome Measure  Help needed turning from your back to your side while in a flat bed without using bedrails?: A Little Help needed moving from lying on your back to sitting on the side of a flat bed without using bedrails?: A Little Help needed moving to and from a bed to a chair (including a wheelchair)?: A Lot Help needed standing up from a chair using your arms (e.g., wheelchair or bedside chair)?: A Little Help needed to walk in hospital room?: Total Help needed climbing 3-5 steps with a railing? : Total 6 Click Score: 13    End of Session         PT Visit Diagnosis: Pain;Other abnormalities of gait and mobility (R26.89) Pain - Right/Left: Left Pain - part of body: Leg     Time: 3086-5784 PT Time Calculation (min) (ACUTE ONLY): 35 min  Charges:    $Therapeutic Exercise: 8-22 mins $Therapeutic Activity: 8-22 mins PT General Charges $$ ACUTE PT VISIT: 1  Visit                     06/06/2023  Jacinto Halim., PT Acute Rehabilitation Services [phone]  (office)   Eliseo Gum Binnie Droessler 06/06/2023, 5:58 PM

## 2023-06-06 NOTE — Progress Notes (Signed)
Subjective: 2 Days Post-Op Procedure(s) (LRB): OPEN REDUCTION INTERNAL FIXATION (ORIF) ANKLE FRACTURE vs EX FIX (Left) EXTERNAL FIXATION LEG (Left)  Patient reports pain as appropriately controlled. Denies any new numbness/tingling.   Objective:   VITALS:  Temp:  [98 F (36.7 C)-98.8 F (37.1 C)] 98 F (36.7 C) (08/02 0805) Pulse Rate:  [82-91] 83 (08/02 0837) Resp:  [18] 18 (08/02 0837) BP: (102-124)/(60-75) 102/60 (08/02 0805) SpO2:  [94 %-96 %] 96 % (08/02 0837) Weight:  [106.1 kg] 106.1 kg (08/02 0500)  Gen: AAOx3, NAD  Left lower extremity: Well padded short leg splint in place Wiggles toes SILT over toes CR<2s    LABS Recent Labs    06/04/23 0121 06/06/23 0639  HGB 10.9* 9.7*  WBC 7.8 7.6  PLT 219 227   Recent Labs    06/04/23 0121 06/06/23 0639  NA 137 138  K 3.8 3.2*  CL 102 103  CO2 24 27  BUN 20 17  CREATININE 0.95 0.96  GLUCOSE 134* 120*   No results for input(s): "LABPT", "INR" in the last 72 hours.   Assessment/Plan: 2 Days Post-Op Procedure(s) (LRB): OPEN REDUCTION INTERNAL FIXATION (ORIF) ANKLE FRACTURE vs EX FIX (Left) EXTERNAL FIXATION LEG (Left)  -stable on RNF under hospital medicine team -strict NWB operative extremity, maximum elevation -maintain short leg splint until follow up -DVT ppx: Aspirin 81 mg twice daily while NWB or per primary team preference -follow up as outpatient within 7-10 days for wound check with exchange of short leg splint to short leg cast -sutures out in 2-3 weeks in outpatient office  Netta Cedars 06/06/2023, 2:29 PM

## 2023-06-06 NOTE — Progress Notes (Signed)
PROGRESS NOTE    Haley White  WUJ:[id number] DOB: 21-Feb-1956 DOA: 06/02/2023 PCP: Lucky Cowboy, MD   Brief Narrative:  67 y.o. female with with no reported significant past medical history, who is admitted to [facility] on 06/02/2023 with acute left trimalleolar ankle fracture after presenting from home to [facility] ED complaining of acute left ankle discomfort.   Admitted for pain control in the setting of left ankle fracture.  Initially no plans for surgery, eventually underwent ORIF on 7/31.   Assessment & Plan:  Principal Problem:   Trimalleolar fracture of left ankle Active Problems:   Fall at home, initial encounter   Acute left ankle pain   Ankle fracture     Acute Trimalleolar Fracture Subluxation; Left Small Acute Avulsion Fracture of the Anterolateral Tibial Plafond S/p manual reduction under conscious sedation in the ED -Initially orthopedic recommended pain control and outpatient follow-up.  Eventually underwent ORIF on 7/31.  Patient tolerated this procedure well.  PT/OT.  Supportive care.  Pain control.   COPD? -Continue home Dulera  Constipation - Bowel regimen   Depression  Anxiety -Continue home Prozac and Wellbutrin   Dyslipidemia -Statin   GERD -PPI   Hypertension -Hold lisinopril while giving toradol.  IV as needed        DVT prophylaxis: Lovenox Code Status: Full code Family Communication: Her pastor at bedside Status is: Inpatient SNF placement      Diet Orders (From admission, onward)     Start     Ordered   06/05/23 0833  Diet 2 gram sodium Room service appropriate? Yes; Fluid consistency: Thin  Diet effective now       Question Answer Comment  Room service appropriate? Yes   Fluid consistency: Thin      06/05/23 0832            Subjective:  No complaints.  No bowel movement air. Examination:  Constitutional: Not in acute distress Respiratory: Clear to auscultation bilaterally Cardiovascular: Normal sinus  rhythm, no rubs Abdomen: Nontender nondistended good bowel sounds Musculoskeletal: No edema noted Skin: Left lower Ext dressing in place Neurologic: CN 2-12 grossly intact.  And nonfocal Psychiatric: Normal judgment and insight. Alert and oriented x 3. Normal mood.  Objective: Vitals:   06/06/23 0024 06/06/23 0500 06/06/23 0805 06/06/23 0837  BP: 113/72  102/60   Pulse: 83  82 83  Resp:   18 18  Temp: 98.7 F (37.1 C)  98 F (36.7 C)   TempSrc: Oral     SpO2: 94%  96% 96%  Weight:  106.1 kg    Height:        Intake/Output Summary (Last 24 hours) at 06/06/2023 1200 Last data filed at 06/06/2023 0600 Gross per 24 hour  Intake 100 ml  Output 1500 ml  Net -1400 ml   Filed Weights   06/04/23 0538 06/04/23 1727 06/06/23 0500  Weight: 105.6 kg 105.6 kg 106.1 kg    Scheduled Meds:  buPROPion  300 mg Oral Daily   Chlorhexidine Gluconate Cloth  6 each Topical Daily   enoxaparin (LOVENOX) injection  40 mg Subcutaneous Q24H   FLUoxetine  60 mg Oral Daily   ketorolac  15 mg Intravenous Q6H   mometasone-formoterol  2 puff Inhalation BID   montelukast  10 mg Oral QHS   pantoprazole  40 mg Oral Daily   polyethylene glycol  17 g Oral BID   rosuvastatin  40 mg Oral Daily   Continuous Infusions:  methocarbamol (ROBAXIN) IV  500 mg (06/06/23 1013)    Nutritional status     Body mass index is 40.15 kg/m.  Data Reviewed:   CBC: Recent Labs  Lab 06/02/23 2001 06/03/23 0054 06/04/23 0121 06/06/23 0639  WBC 9.7 9.0 7.8 7.6  NEUTROABS 5.6 5.7 5.0  --   HGB 11.6* 12.0 10.9* 9.7*  HCT 35.1* 36.1 34.2* 29.3*  MCV 95.6 98.4 98.3 97.7  PLT 255 231 219 227   Basic Metabolic Panel: Recent Labs  Lab 06/02/23 2001 06/03/23 0054 06/04/23 0121 06/06/23 0639  NA 142 139 137 138  K 4.1 4.3 3.8 3.2*  CL 105 106 102 103  CO2 23 22 24 27   GLUCOSE 87 115* 134* 120*  BUN 16 18 20 17   CREATININE 0.96 0.98 0.95 0.96  CALCIUM 8.6* 8.6* 8.6* 8.5*  MG 1.7 1.8 2.0 1.8  PHOS  --   --   4.8*  --    GFR: Estimated Creatinine Clearance: 68.5 mL/min (by C-G formula based on SCr of 0.96 mg/dL). Liver Function Tests: Recent Labs  Lab 06/02/23 2001 06/03/23 0054 06/04/23 0121  AST 23 22 26   ALT 27 29 25   ALKPHOS 51 56 56  BILITOT 1.1 1.3* 1.3*  PROT 5.8* 6.1* 5.8*  ALBUMIN 3.7 3.9 3.6   No results for input(s): "LIPASE", "AMYLASE" in the last 168 hours. No results for input(s): "AMMONIA" in the last 168 hours. Coagulation Profile: Recent Labs  Lab 06/03/23 0054  INR 1.0   Cardiac Enzymes: No results for input(s): "CKTOTAL", "CKMB", "CKMBINDEX", "TROPONINI" in the last 168 hours. BNP (last 3 results) No results for input(s): "PROBNP" in the last 8760 hours. HbA1C: No results for input(s): "HGBA1C" in the last 72 hours. CBG: Recent Labs  Lab 06/05/23 0903 06/05/23 1747  GLUCAP 116* 130*   Lipid Profile: No results for input(s): "CHOL", "HDL", "LDLCALC", "TRIG", "CHOLHDL", "LDLDIRECT" in the last 72 hours. Thyroid Function Tests: No results for input(s): "TSH", "T4TOTAL", "FREET4", "T3FREE", "THYROIDAB" in the last 72 hours. Anemia Panel: No results for input(s): "VITAMINB12", "FOLATE", "FERRITIN", "TIBC", "IRON", "RETICCTPCT" in the last 72 hours. Sepsis Labs: No results for input(s): "PROCALCITON", "LATICACIDVEN" in the last 168 hours.  Recent Results (from the past 240 hour(s))  Surgical PCR screen     Status: None   Collection Time: 06/03/23  4:37 AM   Specimen: Nasal Mucosa; Nasal Swab  Result Value Ref Range Status   MRSA, PCR NEGATIVE NEGATIVE Final   Staphylococcus aureus NEGATIVE NEGATIVE Final    Comment: (NOTE) The Xpert SA Assay (FDA approved for NASAL specimens in patients 25 years of age and older), is one component of a comprehensive surveillance program. It is not intended to diagnose infection nor to guide or monitor treatment. Performed at [facility], [address], [place], [place] [address]          Radiology  Studies: DG Ankle Complete Left  Result Date: 06/05/2023 CLINICAL DATA:  Ankle surgery EXAM: LEFT ANKLE COMPLETE - 3+ VIEW COMPARISON:  06/02/2023 FINDINGS: Six low resolution intraoperative spot views of the left ankle. Total fluoroscopy time was 2 minutes 52 seconds, fluoroscopic dose of 4 mGy. Images demonstrate trimalleolar fracture with lateral subluxation of the talus. Subsequent surgical plate and fixating screws at the distal fibula and tibia with near anatomic alignment. IMPRESSION: Intraoperative fluoroscopic assistance provided during left ankle surgery. Electronically Signed   By: Jasmine Pang M.D.   On: 06/05/2023 00:18   DG C-Arm 1-60 Min-No Report  Result  Date: 06/04/2023 Fluoroscopy was utilized by the requesting physician.  No radiographic interpretation.   DG C-Arm 1-60 Min-No Report  Result Date: 06/04/2023 Fluoroscopy was utilized by the requesting physician.  No radiographic interpretation.           LOS: 3 days   Time spent= 35 mins    Sharica Roedel Joline Maxcy, MD [facility]  If 7PM-7AM, please contact night-coverage  06/06/2023, 12:00 PM

## 2023-06-06 NOTE — Progress Notes (Signed)
Patient to self administer CPAP using her home equipment when she is ready for bed. Machine placed within her reach and humidifier filled with water. Patient is familiar with equipment and procedure.

## 2023-06-06 NOTE — [place] FL2 (Signed)
[place] MEDICAID FL2 LEVEL OF CARE FORM     IDENTIFICATION  Patient Name: Haley White Birthdate: 04-05-1956 Sex: female Admission Date (Current Location): 06/02/2023  [facility] and [place] Number:  Producer, television/film/video and Address:  [facility] [place], [address] [place], [place] [address]      Provider Number: [id number]  Attending Physician Name and Address:  Dimple Nanas, MD  Relative Name and Phone Number:       Current Level of Care: Hospital Recommended Level of Care: Skilled Nursing Facility Prior Approval Number:    Date Approved/Denied:   PASRR Number: [id number]  Discharge Plan: SNF    Current Diagnoses: Patient Active Problem List   Diagnosis Date Noted   Ankle fracture 06/03/2023   Trimalleolar fracture of left ankle 06/02/2023   Fall at home, initial encounter 06/02/2023   Acute left ankle pain 06/02/2023    Orientation RESPIRATION BLADDER Height & Weight     Self, Time, Situation, Place  Other (Comment) (CPAP at night - has home CPAP equipment) External catheter Weight: 106.1 kg Height:  5\' 4"  (162.6 cm)  BEHAVIORAL SYMPTOMS/MOOD NEUROLOGICAL BOWEL NUTRITION STATUS      Continent Diet (See Discharge summary)  AMBULATORY STATUS COMMUNICATION OF NEEDS Skin   Extensive Assist Verbally Surgical wounds (Cast on Left lower leg)                       Personal Care Assistance Level of Assistance  Bathing, Feeding, Dressing Bathing Assistance: Limited assistance Feeding assistance: Independent Dressing Assistance: Limited assistance     Functional Limitations Info  Sight, Hearing, Speech Sight Info: Adequate Hearing Info: Adequate Speech Info: Adequate    SPECIAL CARE FACTORS FREQUENCY  PT (By licensed PT), OT (By licensed OT)     PT Frequency: 5 x per week OT Frequency: 5 x per week            Contractures Contractures Info: Not present    Additional Factors Info  Code Status, Allergies, Psychotropic  Code Status Info: Full Code Allergies Info: Mold, Biaxin, Tequin, Serevent, Prednisone Psychotropic Info: Wellbutrin XL, Prozac, Trazodone         Current Medications (06/06/2023):  This is the current hospital active medication list Current Facility-Administered Medications  Medication Dose Route Frequency Provider Last Rate Last Admin   acetaminophen (TYLENOL) tablet 650 mg  650 mg Oral Q6H PRN Zigmund Daniel., MD       albuterol (PROVENTIL) (2.5 MG/3ML) 0.083% nebulizer solution 2.5 mg  2.5 mg Nebulization Q6H PRN Leisl, Denman, RPH       buPROPion (WELLBUTRIN XL) 24 hr tablet 300 mg  300 mg Oral Daily Zigmund Daniel., MD   300 mg at 06/06/23 0855   Chlorhexidine Gluconate Cloth 2 % PADS 6 each  6 each Topical Daily Dimple Nanas, MD   6 each at 06/06/23 0857   enoxaparin (LOVENOX) injection 40 mg  40 mg Subcutaneous Q24H Amin, Ankit Chirag, MD   40 mg at 06/06/23 0903   FLUoxetine (PROZAC) capsule 60 mg  60 mg Oral Daily Zigmund Daniel., MD   60 mg at 06/06/23 0855   guaiFENesin (ROBITUSSIN) 100 MG/5ML liquid 5 mL  5 mL Oral Q4H PRN Amin, Ankit Chirag, MD       hydrALAZINE (APRESOLINE) injection 10 mg  10 mg Intravenous Q4H PRN Amin, Ankit Chirag, MD       HYDROmorphone (DILAUDID) injection 0.5-1  mg  0.5-1 mg Intravenous Q2H PRN Zigmund Daniel., MD   1 mg at 06/06/23 0856   ketorolac (TORADOL) 15 MG/ML injection 15 mg  15 mg Intravenous Q6H Zigmund Daniel., MD   15 mg at 06/06/23 0549   melatonin tablet 3 mg  3 mg Oral QHS PRN Howerter, Justin B, DO   3 mg at 06/03/23 2017   methocarbamol (ROBAXIN) 500 mg in dextrose 5 % 50 mL IVPB  500 mg Intravenous Q6H Zigmund Daniel., MD 110 mL/hr at 06/06/23 1013 500 mg at 06/06/23 1013   metoprolol tartrate (LOPRESSOR) injection 5 mg  5 mg Intravenous Q4H PRN Amin, Loura Halt, MD       mometasone-formoterol (DULERA) 200-5 MCG/ACT inhaler 2 puff  2 puff Inhalation BID Zigmund Daniel., MD   2  puff at 06/06/23 0837   montelukast (SINGULAIR) tablet 10 mg  10 mg Oral QHS Zigmund Daniel., MD   10 mg at 06/05/23 2014   morphine (PF) 2 MG/ML injection 2 mg  2 mg Intravenous Q2H PRN Dimple Nanas, MD   2 mg at 06/04/23 1302   naloxone (NARCAN) injection 0.4 mg  0.4 mg Intravenous PRN Howerter, Justin B, DO       ondansetron (ZOFRAN) injection 4 mg  4 mg Intravenous Q6H PRN Amin, Ankit Chirag, MD       oxyCODONE (Oxy IR/ROXICODONE) immediate release tablet 5 mg  5 mg Oral Q4H PRN Zigmund Daniel., MD   5 mg at 06/06/23 1191   Or   oxyCODONE (Oxy IR/ROXICODONE) immediate release tablet 10 mg  10 mg Oral Q4H PRN Zigmund Daniel., MD   10 mg at 06/06/23 1011   pantoprazole (PROTONIX) EC tablet 40 mg  40 mg Oral Daily Zigmund Daniel., MD   40 mg at 06/06/23 0856   polyethylene glycol (MIRALAX / GLYCOLAX) packet 17 g  17 g Oral BID Amin, Ankit Chirag, MD   17 g at 06/06/23 0856   rosuvastatin (CRESTOR) tablet 40 mg  40 mg Oral Daily Zigmund Daniel., MD   40 mg at 06/06/23 0856   traZODone (DESYREL) tablet 50 mg  50 mg Oral QHS PRN Dimple Nanas, MD   50 mg at 06/05/23 2230     Discharge Medications: Please see discharge summary for a list of discharge medications.  Relevant Imaging Results:  Relevant Lab Results:   Additional Information SS# [id number]  Janae Bridgeman, RN

## 2023-06-07 DIAGNOSIS — S82852D Displaced trimalleolar fracture of left lower leg, subsequent encounter for closed fracture with routine healing: Secondary | ICD-10-CM | POA: Diagnosis not present

## 2023-06-07 LAB — BASIC METABOLIC PANEL WITH GFR
Anion gap: 8 (ref 5–15)
BUN: 15 mg/dL (ref 8–23)
CO2: 26 mmol/L (ref 22–32)
Calcium: 8.6 mg/dL — ABNORMAL LOW (ref 8.9–10.3)
Chloride: 105 mmol/L (ref 98–111)
Creatinine, Ser: 1.03 mg/dL — ABNORMAL HIGH (ref 0.44–1.00)
GFR, Estimated: 60 mL/min — ABNORMAL LOW
Glucose, Bld: 113 mg/dL — ABNORMAL HIGH (ref 70–99)
Potassium: 3.5 mmol/L (ref 3.5–5.1)
Sodium: 139 mmol/L (ref 135–145)

## 2023-06-07 LAB — GLUCOSE, CAPILLARY
Glucose-Capillary: 103 mg/dL — ABNORMAL HIGH (ref 70–99)
Glucose-Capillary: 107 mg/dL — ABNORMAL HIGH (ref 70–99)
Glucose-Capillary: 115 mg/dL — ABNORMAL HIGH (ref 70–99)

## 2023-06-07 MED ORDER — DOCUSATE SODIUM 100 MG PO CAPS: 100.0000 mg | ORAL_CAPSULE | Freq: Two times a day (BID) | ORAL | Status: DC

## 2023-06-07 MED ORDER — METHOCARBAMOL 500 MG PO TABS: 500.0000 mg | ORAL_TABLET | Freq: Three times a day (TID) | ORAL | 0 refills | Status: DC | PRN

## 2023-06-07 MED ORDER — MAGNESIUM CITRATE PO SOLN
1.0000 | Freq: Once | ORAL | Status: AC
  Administered 2023-06-07: 1 via ORAL
  Filled 2023-06-07: qty 296

## 2023-06-07 MED ORDER — METHYLPHENIDATE HCL ER (LA) 10 MG PO CP24: 10.0000 mg | ORAL_CAPSULE | Freq: Every day | ORAL | 0 refills | Status: DC

## 2023-06-07 MED ORDER — TRAMADOL HCL 50 MG PO TABS: 50.0000 mg | ORAL_TABLET | Freq: Three times a day (TID) | ORAL | 0 refills | Status: DC | PRN

## 2023-06-07 MED ORDER — POLYETHYLENE GLYCOL 3350 17 G PO PACK: 17.0000 g | PACK | Freq: Two times a day (BID) | ORAL | Status: DC

## 2023-06-07 MED ORDER — OXYCODONE HCL 5 MG PO TABS: 5.0000 mg | ORAL_TABLET | ORAL | 0 refills | Status: DC | PRN

## 2023-06-07 NOTE — Progress Notes (Signed)
Physical Therapy Treatment Patient Details Name: Haley White MRN: [id number] DOB: [birth year] Today's Date: 06/07/2023   History of Present Illness 67 y/o female adm 7/29 after fall with Lt trimalleolar ankle fracture. 7/31 ORIF. NWB. No significant PMhx    PT Comments  Pt supine in bed.  Pt is motivated and eager to mobilize.  Performed LE therex and stand pivot transfer with use of rolling walker.  Pt remains limited due to pain in her L ankle in dependent position.  Continues to benefit from rehab in a post acute setting to maximize functional gains before returning home.      If plan is discharge home, recommend the following: A lot of help with walking and/or transfers;A little help with bathing/dressing/bathroom   Can travel by private vehicle     No  Equipment Recommendations  Wheelchair (measurements PT);Wheelchair cushion (measurements PT)    Recommendations for Other Services       Precautions / Restrictions Precautions Precautions: Fall;Other (comment) Precaution Comments: anxious Restrictions Weight Bearing Restrictions: Yes LLE Weight Bearing: Non weight bearing     Mobility  Bed Mobility Overal bed mobility: Needs Assistance Bed Mobility: Supine to Sit     Supine to sit: Min assist     General bed mobility comments: supervision to elevate trunk, min assistance to move to edge of bed with RLE supported.    Transfers Overall transfer level: Needs assistance Equipment used: Rolling walker (2 wheels) Transfers: Sit to/from Stand Sit to Stand: Min assist Stand pivot transfers: Min assist         General transfer comment: Cues for hand placement to and from seated surface.  Pt slow to mobilize.  Able to pivot on supporting R LE to recliner from bed by lifting R heel and pivoting.    Ambulation/Gait Ambulation/Gait assistance:  (did not assess, increased pain noted with transfer.)                 Stairs             Wheelchair Mobility      Tilt Bed    Modified Rankin (Stroke Patients Only)       Balance Overall balance assessment: Needs assistance   Sitting balance-Leahy Scale: Fair       Standing balance-Leahy Scale: Poor                              Cognition Arousal/Alertness: Awake/alert Behavior During Therapy: Anxious Overall Cognitive Status: Within Functional Limits for tasks assessed                                 General Comments: Pt with anxiety with initiation of bringing BLE down from recliner; improved ability to maintain calm mood state with cues for deep breathing prior to engagement in further mobility. Pt able to focus on therapist after this.        Exercises General Exercises - Lower Extremity Ankle Circles/Pumps: Right, AROM, 15 reps, Supine Quad Sets: AROM, Both, 10 reps, Supine Heel Slides: AAROM, AROM, Left, 10 reps, Supine Hip ABduction/ADduction: AAROM, AROM, Left, 10 reps, Supine Straight Leg Raises: AROM, AAROM, Left, 10 reps, Supine    General Comments        Pertinent Vitals/Pain Pain Assessment Pain Assessment: 0-10 Pain Score: 3  (pre tx appears high post tx.) Pain Intervention(s): Monitored during session, Repositioned, Patient requesting pain  meds-RN notified    Home Living                          Prior Function            PT Goals (current goals can now be found in the care plan section) Acute Rehab PT Goals Patient Stated Goal: be able to go to [place] Potential to Achieve Goals: Good Progress towards PT goals: Progressing toward goals    Frequency    Min 1X/week      PT Plan Current plan remains appropriate    Co-evaluation              AM-PAC PT "6 Clicks" Mobility   Outcome Measure  Help needed turning from your back to your side while in a flat bed without using bedrails?: A Little Help needed moving from lying on your back to sitting on the side of a flat bed without using bedrails?: A  Little Help needed moving to and from a bed to a chair (including a wheelchair)?: A Little Help needed standing up from a chair using your arms (e.g., wheelchair or bedside chair)?: A Little Help needed to walk in hospital room?: Total Help needed climbing 3-5 steps with a railing? : Total 6 Click Score: 14    End of Session Equipment Utilized During Treatment: Gait belt Activity Tolerance: Patient tolerated treatment well Patient left: in chair;with call bell/phone within reach;with chair alarm set;with family/visitor present (does not have nurse call cord in room but chair alarm is plugged into box and activated.) Nurse Communication: Mobility status;Patient requests pain meds PT Visit Diagnosis: Pain;Other abnormalities of gait and mobility (R26.89) Pain - Right/Left: Left Pain - part of body: Leg     Time: 6578-4696 PT Time Calculation (min) (ACUTE ONLY): 21 min  Charges:    $Therapeutic Activity: 8-22 mins PT General Charges $$ ACUTE PT VISIT: 1 Visit                     Bonney Leitz , PTA Acute Rehabilitation Services Office [phone]    Florestine Avers 06/07/2023, 11:28 AM

## 2023-06-07 NOTE — Plan of Care (Signed)
Haley Heckle, LPN

## 2023-06-07 NOTE — Progress Notes (Signed)
PROGRESS NOTE    Haley White  ZOX:[id number] DOB: September 04, 1956 DOA: 06/02/2023 PCP: Lucky Cowboy, MD   Brief Narrative:  67 y.o. female with with no reported significant past medical history, who is admitted to [facility] on 06/02/2023 with acute left trimalleolar ankle fracture after presenting from home to [facility] ED complaining of acute left ankle discomfort.   Admitted for pain control in the setting of left ankle fracture.  Initially no plans for surgery, eventually underwent ORIF on 7/31.   Assessment & Plan:  Principal Problem:   Trimalleolar fracture of left ankle Active Problems:   Fall at home, initial encounter   Acute left ankle pain   Ankle fracture     Acute Trimalleolar Fracture Subluxation; Left Small Acute Avulsion Fracture of the Anterolateral Tibial Plafond S/p manual reduction under conscious sedation in the ED -Initially orthopedic recommended pain control and outpatient follow-up.  Eventually underwent ORIF on 7/31.  Patient tolerated this procedure well.  PT/OT.  Supportive care.  Pain control.   COPD? -Continue home Dulera  Constipation - Bowel regimen.  Will give one-time mag citrate   Depression  Anxiety -Continue home Prozac and Wellbutrin   Dyslipidemia -Statin   GERD -PPI   Hypertension -Hold lisinopril while giving toradol.  IV as needed        DVT prophylaxis: Lovenox Code Status: Full code Family Communication: Her pastor at bedside Status is: Inpatient SNF placement      Diet Orders (From admission, onward)     Start     Ordered   06/05/23 0833  Diet 2 gram sodium Room service appropriate? Yes; Fluid consistency: Thin  Diet effective now       Question Answer Comment  Room service appropriate? Yes   Fluid consistency: Thin      06/05/23 0832            Subjective:  No complaints besides pain at this time. Examination:  Constitutional: Not in acute distress Respiratory: Clear to auscultation  bilaterally Cardiovascular: Normal sinus rhythm, no rubs Abdomen: Nontender nondistended good bowel sounds Musculoskeletal: No edema noted Skin: Lower extremity dressing/splint in place Neurologic: CN 2-12 grossly intact.  And nonfocal Psychiatric: Normal judgment and insight. Alert and oriented x 3. Normal mood.  Objective: Vitals:   06/06/23 1952 06/06/23 2058 06/07/23 0425 06/07/23 0744  BP: 129/75  124/88 128/71  Pulse: 88 84 83 77  Resp:  18  17  Temp: 98.3 F (36.8 C)  (!) 97.5 F (36.4 C) (!) 97.5 F (36.4 C)  TempSrc: Oral  Oral Oral  SpO2: 94%  96% 98%  Weight:   106.3 kg   Height:       No intake or output data in the 24 hours ending 06/07/23 1307  Filed Weights   06/04/23 1727 06/06/23 0500 06/07/23 0425  Weight: 105.6 kg 106.1 kg 106.3 kg    Scheduled Meds:  buPROPion  300 mg Oral Daily   Chlorhexidine Gluconate Cloth  6 each Topical Daily   docusate sodium  100 mg Oral BID   enoxaparin (LOVENOX) injection  40 mg Subcutaneous Q24H   FLUoxetine  60 mg Oral Daily   ketorolac  15 mg Intravenous Q6H   mometasone-formoterol  2 puff Inhalation BID   montelukast  10 mg Oral QHS   pantoprazole  40 mg Oral Daily   polyethylene glycol  17 g Oral BID   rosuvastatin  40 mg Oral Daily   Continuous Infusions:  methocarbamol (ROBAXIN) IV 500  mg (06/07/23 1114)    Nutritional status     Body mass index is 40.23 kg/m.  Data Reviewed:   CBC: Recent Labs  Lab 06/02/23 2001 06/03/23 0054 06/04/23 0121 06/06/23 0639 06/07/23 0503  WBC 9.7 9.0 7.8 7.6 7.0  NEUTROABS 5.6 5.7 5.0  --   --   HGB 11.6* 12.0 10.9* 9.7* 10.0*  HCT 35.1* 36.1 34.2* 29.3* 30.5*  MCV 95.6 98.4 98.3 97.7 95.9  PLT 255 231 219 227 251   Basic Metabolic Panel: Recent Labs  Lab 06/02/23 2001 06/03/23 0054 06/04/23 0121 06/06/23 0639 06/07/23 0503  NA 142 139 137 138 139  K 4.1 4.3 3.8 3.2* 3.5  CL 105 106 102 103 105  CO2 23 22 24 27 26   GLUCOSE 87 115* 134* 120* 113*  BUN  16 18 20 17 15   CREATININE 0.96 0.98 0.95 0.96 1.03*  CALCIUM 8.6* 8.6* 8.6* 8.5* 8.6*  MG 1.7 1.8 2.0 1.8 1.9  PHOS  --   --  4.8*  --   --    GFR: Estimated Creatinine Clearance: 63.9 mL/min (A) (by C-G formula based on SCr of 1.03 mg/dL (H)). Liver Function Tests: Recent Labs  Lab 06/02/23 2001 06/03/23 0054 06/04/23 0121  AST 23 22 26   ALT 27 29 25   ALKPHOS 51 56 56  BILITOT 1.1 1.3* 1.3*  PROT 5.8* 6.1* 5.8*  ALBUMIN 3.7 3.9 3.6   No results for input(s): "LIPASE", "AMYLASE" in the last 168 hours. No results for input(s): "AMMONIA" in the last 168 hours. Coagulation Profile: Recent Labs  Lab 06/03/23 0054  INR 1.0   Cardiac Enzymes: No results for input(s): "CKTOTAL", "CKMB", "CKMBINDEX", "TROPONINI" in the last 168 hours. BNP (last 3 results) No results for input(s): "PROBNP" in the last 8760 hours. HbA1C: No results for input(s): "HGBA1C" in the last 72 hours. CBG: Recent Labs  Lab 06/05/23 0903 06/05/23 1747 06/06/23 2025 06/07/23 0048 06/07/23 0748  GLUCAP 116* 130* 118* 115* 107*   Lipid Profile: No results for input(s): "CHOL", "HDL", "LDLCALC", "TRIG", "CHOLHDL", "LDLDIRECT" in the last 72 hours. Thyroid Function Tests: No results for input(s): "TSH", "T4TOTAL", "FREET4", "T3FREE", "THYROIDAB" in the last 72 hours. Anemia Panel: No results for input(s): "VITAMINB12", "FOLATE", "FERRITIN", "TIBC", "IRON", "RETICCTPCT" in the last 72 hours. Sepsis Labs: No results for input(s): "PROCALCITON", "LATICACIDVEN" in the last 168 hours.  Recent Results (from the past 240 hour(s))  Surgical PCR screen     Status: None   Collection Time: 06/03/23  4:37 AM   Specimen: Nasal Mucosa; Nasal Swab  Result Value Ref Range Status   MRSA, PCR NEGATIVE NEGATIVE Final   Staphylococcus aureus NEGATIVE NEGATIVE Final    Comment: (NOTE) The Xpert SA Assay (FDA approved for NASAL specimens in patients 29 years of age and older), is one component of a  comprehensive surveillance program. It is not intended to diagnose infection nor to guide or monitor treatment. Performed at [facility], [address], [place], [place] [address]          Radiology Studies: No results found.         LOS: 4 days   Time spent= 35 mins    Danielle Lento Joline Maxcy, MD [facility]  If 7PM-7AM, please contact night-coverage  06/07/2023, 1:07 PM

## 2023-06-07 NOTE — Progress Notes (Signed)
   Subjective: 3 Days Post-Op Procedure(s) (LRB): OPEN REDUCTION INTERNAL FIXATION (ORIF) ANKLE FRACTURE vs EX FIX (Left) EXTERNAL FIXATION LEG (Left)  Recheck left ankle s/p ORIF No pain at rest but moderate pain with any movement Denies any new symptoms or issues Patient reports pain as moderate.  Objective:   VITALS:   Vitals:   06/07/23 0425 06/07/23 0744  BP: 124/88 128/71  Pulse: 83 77  Resp:  17  Temp: (!) 97.5 F (36.4 C) (!) 97.5 F (36.4 C)  SpO2: 96% 98%    Left ankle: well padded splint in place Nv intact distally Able to move toes without difficulty No signs of drainage  Continues to be non weight bearing  LABS Recent Labs    06/06/23 0639 06/07/23 0503  HGB 9.7* 10.0*  HCT 29.3* 30.5*  WBC 7.6 7.0  PLT 227 251    Recent Labs    06/06/23 0639 06/07/23 0503  NA 138 139  K 3.2* 3.5  BUN 17 15  CREATININE 0.96 1.03*  GLUCOSE 120* 113*     Assessment/Plan: 3 Days Post-Op Procedure(s) (LRB): OPEN REDUCTION INTERNAL FIXATION (ORIF) ANKLE FRACTURE vs EX FIX (Left) EXTERNAL FIXATION LEG (Left) Continue non weight bearing left lower extremity Pain management as needed Will continue to monitor her progress     Alphonsa Overall PA-C, MPAS [facility] is now [facility] [employer] [facility] [address], [place], [place] [address] Phone: [phone] [email or web address] Facebook  [employer]

## 2023-06-08 DIAGNOSIS — S82852D Displaced trimalleolar fracture of left lower leg, subsequent encounter for closed fracture with routine healing: Secondary | ICD-10-CM | POA: Diagnosis not present

## 2023-06-08 LAB — BASIC METABOLIC PANEL WITH GFR
Anion gap: 12 (ref 5–15)
BUN: 16 mg/dL (ref 8–23)
CO2: 25 mmol/L (ref 22–32)
Calcium: 8.8 mg/dL — ABNORMAL LOW (ref 8.9–10.3)
Chloride: 104 mmol/L (ref 98–111)
Creatinine, Ser: 0.84 mg/dL (ref 0.44–1.00)
GFR, Estimated: >60 mL/min (ref >60)
Glucose, Bld: 107 mg/dL — ABNORMAL HIGH (ref 70–99)
Potassium: 3.7 mmol/L (ref 3.5–5.1)
Sodium: 141 mmol/L (ref 135–145)

## 2023-06-08 LAB — CBC
HCT: 31.1 % — ABNORMAL LOW (ref 36.0–46.0)
Hemoglobin: 10.3 g/dL — ABNORMAL LOW (ref 12.0–15.0)
MCH: 32.4 pg (ref 26.0–34.0)
MCHC: 33.1 g/dL (ref 30.0–36.0)
MCV: 97.8 fL (ref 80.0–100.0)
Platelets: 256 10*3/uL (ref 150–400)
RBC: 3.18 MIL/uL — ABNORMAL LOW (ref 3.87–5.11)
RDW: 12.3 % (ref 11.5–15.5)
WBC: 6.6 10*3/uL (ref 4.0–10.5)
nRBC: 0 % (ref 0.0–0.2)

## 2023-06-08 LAB — MAGNESIUM: Magnesium: 2.3 mg/dL (ref 1.7–2.4)

## 2023-06-08 LAB — GLUCOSE, CAPILLARY
Glucose-Capillary: 107 mg/dL — ABNORMAL HIGH (ref 70–99)
Glucose-Capillary: 114 mg/dL — ABNORMAL HIGH (ref 70–99)
Glucose-Capillary: 113 mg/dL — ABNORMAL HIGH (ref 70–99)

## 2023-06-08 MED ORDER — POTASSIUM CHLORIDE CRYS ER 20 MEQ PO TBCR
40.0000 meq | EXTENDED_RELEASE_TABLET | Freq: Once | ORAL | Status: AC
  Administered 2023-06-08: 40 meq via ORAL
  Filled 2023-06-08: qty 2

## 2023-06-08 NOTE — Progress Notes (Signed)
PROGRESS NOTE    Haley White  ZOX:[id number] DOB: July 13, 1956 DOA: 06/02/2023 PCP: Lucky Cowboy, MD   Brief Narrative:  67 y.o. female with with no reported significant past medical history, who is admitted to [facility] on 06/02/2023 with acute left trimalleolar ankle fracture after presenting from home to [facility] ED complaining of acute left ankle discomfort.   Admitted for pain control in the setting of left ankle fracture.  Initially no plans for surgery, eventually underwent ORIF on 7/31.   Assessment & Plan:  Principal Problem:   Trimalleolar fracture of left ankle Active Problems:   Fall at home, initial encounter   Acute left ankle pain   Ankle fracture     Acute Trimalleolar Fracture Subluxation; Left Small Acute Avulsion Fracture of the Anterolateral Tibial Plafond S/p manual reduction under conscious sedation in the ED -Initially orthopedic recommended pain control and outpatient follow-up.  Eventually underwent ORIF on 7/31.  Patient tolerated this procedure well.  PT/OT.  Supportive care.  Pain control.  Advised her how to stay here for pain.   COPD? -Continue home Dulera  Constipation - Proved with aggressive bowel regimen.   Depression  Anxiety -Continue home Prozac and Wellbutrin   Dyslipidemia -Statin   GERD -PPI   Hypertension -Hold lisinopril while giving toradol.  IV as needed        DVT prophylaxis: Lovenox Code Status: Full code Family Communication: Her pastor at bedside Status is: Inpatient SNF placement      Diet Orders (From admission, onward)     Start     Ordered   06/05/23 0833  Diet 2 gram sodium Room service appropriate? Yes; Fluid consistency: Thin  Diet effective now       Question Answer Comment  Room service appropriate? Yes   Fluid consistency: Thin      06/05/23 0832            Subjective: Still continues to have left lower extremity pain. Had a large bowel movement this  morning Examination:  Constitutional: Not in acute distress Respiratory: Clear to auscultation bilaterally Cardiovascular: Normal sinus rhythm, no rubs Abdomen: Nontender nondistended good bowel sounds Musculoskeletal: Left lower extremity in splint Skin: No rashes seen Neurologic: CN 2-12 grossly intact.  And nonfocal Psychiatric: Normal judgment and insight. Alert and oriented x 3. Normal mood.  Objective: Vitals:   06/07/23 2132 06/08/23 0544 06/08/23 0732 06/08/23 0750  BP: 137/70 98/80  116/66  Pulse: 92 78  76  Resp: 18 19  17   Temp: 97.8 F (36.6 C) 97.8 F (36.6 C)  (!) 97.5 F (36.4 C)  TempSrc: Oral Oral  Oral  SpO2: 96% 95% 94% 95%  Weight:  109.6 kg    Height:        Intake/Output Summary (Last 24 hours) at 06/08/2023 1011 Last data filed at 06/08/2023 0910 Gross per 24 hour  Intake 1969 ml  Output 400 ml  Net 1569 ml    Filed Weights   06/06/23 0500 06/07/23 0425 06/08/23 0544  Weight: 106.1 kg 106.3 kg 109.6 kg    Scheduled Meds:  buPROPion  300 mg Oral Daily   Chlorhexidine Gluconate Cloth  6 each Topical Daily   docusate sodium  100 mg Oral BID   enoxaparin (LOVENOX) injection  40 mg Subcutaneous Q24H   FLUoxetine  60 mg Oral Daily   ketorolac  15 mg Intravenous Q6H   mometasone-formoterol  2 puff Inhalation BID   montelukast  10 mg Oral QHS  pantoprazole  40 mg Oral Daily   polyethylene glycol  17 g Oral BID   rosuvastatin  40 mg Oral Daily   Continuous Infusions:  methocarbamol (ROBAXIN) IV 500 mg (06/08/23 0548)    Nutritional status     Body mass index is 41.47 kg/m.  Data Reviewed:   CBC: Recent Labs  Lab 06/02/23 2001 06/03/23 0054 06/04/23 0121 06/06/23 0639 06/07/23 0503 06/08/23 0406  WBC 9.7 9.0 7.8 7.6 7.0 6.6  NEUTROABS 5.6 5.7 5.0  --   --   --   HGB 11.6* 12.0 10.9* 9.7* 10.0* 10.3*  HCT 35.1* 36.1 34.2* 29.3* 30.5* 31.1*  MCV 95.6 98.4 98.3 97.7 95.9 97.8  PLT 255 231 219 227 251 256   Basic Metabolic  Panel: Recent Labs  Lab 06/03/23 0054 06/04/23 0121 06/06/23 0639 06/07/23 0503 06/08/23 0406  NA 139 137 138 139 141  K 4.3 3.8 3.2* 3.5 3.7  CL 106 102 103 105 104  CO2 22 24 27 26 25   GLUCOSE 115* 134* 120* 113* 107*  BUN 18 20 17 15 16   CREATININE 0.98 0.95 0.96 1.03* 0.84  CALCIUM 8.6* 8.6* 8.5* 8.6* 8.8*  MG 1.8 2.0 1.8 1.9 2.3  PHOS  --  4.8*  --   --   --    GFR: Estimated Creatinine Clearance: 79.8 mL/min (by C-G formula based on SCr of 0.84 mg/dL). Liver Function Tests: Recent Labs  Lab 06/02/23 2001 06/03/23 0054 06/04/23 0121  AST 23 22 26   ALT 27 29 25   ALKPHOS 51 56 56  BILITOT 1.1 1.3* 1.3*  PROT 5.8* 6.1* 5.8*  ALBUMIN 3.7 3.9 3.6   No results for input(s): "LIPASE", "AMYLASE" in the last 168 hours. No results for input(s): "AMMONIA" in the last 168 hours. Coagulation Profile: Recent Labs  Lab 06/03/23 0054  INR 1.0   Cardiac Enzymes: No results for input(s): "CKTOTAL", "CKMB", "CKMBINDEX", "TROPONINI" in the last 168 hours. BNP (last 3 results) No results for input(s): "PROBNP" in the last 8760 hours. HbA1C: No results for input(s): "HGBA1C" in the last 72 hours. CBG: Recent Labs  Lab 06/06/23 2025 06/07/23 0048 06/07/23 0748 06/07/23 1805 06/08/23 0751  GLUCAP 118* 115* 107* 103* 107*   Lipid Profile: No results for input(s): "CHOL", "HDL", "LDLCALC", "TRIG", "CHOLHDL", "LDLDIRECT" in the last 72 hours. Thyroid Function Tests: No results for input(s): "TSH", "T4TOTAL", "FREET4", "T3FREE", "THYROIDAB" in the last 72 hours. Anemia Panel: No results for input(s): "VITAMINB12", "FOLATE", "FERRITIN", "TIBC", "IRON", "RETICCTPCT" in the last 72 hours. Sepsis Labs: No results for input(s): "PROCALCITON", "LATICACIDVEN" in the last 168 hours.  Recent Results (from the past 240 hour(s))  Surgical PCR screen     Status: None   Collection Time: 06/03/23  4:37 AM   Specimen: Nasal Mucosa; Nasal Swab  Result Value Ref Range Status   MRSA,  PCR NEGATIVE NEGATIVE Final   Staphylococcus aureus NEGATIVE NEGATIVE Final    Comment: (NOTE) The Xpert SA Assay (FDA approved for NASAL specimens in patients 52 years of age and older), is one component of a comprehensive surveillance program. It is not intended to diagnose infection nor to guide or monitor treatment. Performed at [facility], [address] [place], [place] [address]          Radiology Studies: No results found.         LOS: 5 days   Time spent= 35 mins    Ronnett Pullin Joline Maxcy, MD [facility]  If  7PM-7AM, please contact night-coverage  06/08/2023, 10:11 AM

## 2023-06-08 NOTE — Progress Notes (Signed)
   06/03/23 0100  Spiritual Encounters  Type of Visit Initial  Care provided to: Patient  Conversation partners present during encounter Nurse  Referral source Chaplain assessment  Reason for visit Routine spiritual support  OnCall Visit No  Spiritual Framework  Patient Stress Factors Health changes;Loss of control  Family Stress Factors None identified  Interventions  Spiritual Care Interventions Made Compassionate presence;Reflective listening;Established relationship of care and support;Meaning making  Intervention Outcomes  Outcomes Autonomy/agency;Connection to spiritual care;Awareness around self/spiritual resourses;Reduced anxiety;Reduced fear  Mental Health Advance Directives  Would patient like information on creating a mental health advance directive? No - Patient declined   Advertising account executive met with pt at bedside to provide spiritual care and comfort post trauma surgery.  Upon requesst, prayed and anointed patient with oil.  Prayeed with her and her two neighbors.

## 2023-06-08 NOTE — Plan of Care (Signed)
  Problem: Clinical Measurements: Goal: Ability to maintain clinical measurements within normal limits will improve Outcome: Progressing   

## 2023-06-08 NOTE — Progress Notes (Signed)
   06/08/23 0012  BiPAP/CPAP/SIPAP  BiPAP/CPAP/SIPAP Pt Type Adult  BiPAP/CPAP/SIPAP Resmed  FiO2 (%) 21 %  Patient Home Equipment Yes  CPAP/SIPAP surface wiped down Yes  Safety Check Completed by RT for Home Unit Yes, no issues noted

## 2023-06-08 NOTE — Plan of Care (Signed)

## 2023-06-09 DIAGNOSIS — S82852D Displaced trimalleolar fracture of left lower leg, subsequent encounter for closed fracture with routine healing: Secondary | ICD-10-CM | POA: Diagnosis not present

## 2023-06-09 LAB — GLUCOSE, CAPILLARY: Glucose-Capillary: 126 mg/dL — ABNORMAL HIGH (ref 70–99)

## 2023-06-09 NOTE — Plan of Care (Signed)
Haley Heckle, LPN

## 2023-06-09 NOTE — Progress Notes (Signed)
Occupational Therapy Treatment Patient Details Name: Haley White MRN: [id number] DOB: 16-Mar-1956 Today's Date: 06/09/2023   History of present illness 67 y/o female adm 7/29 after fall with Lt trimalleolar ankle fracture. 7/31 ORIF. NWB. No significant PMhx   OT comments  Pt making continued incremental progress towards OT goals though still limited by pain and anxiety with movement. Pt able to progress pivot transfers to Min A w/ consistent LLE elevation/support needed due to pain in dependent position. Pt able to manage some basic UB ADLs seated at sink with repositioning of LLE for tolerance. Patient will benefit from continued inpatient follow up therapy, <3 hours/day as pt's home is not w/c accessible and does not have the physical assist needed at home.    Recommendations for follow up therapy are one component of a multi-disciplinary discharge planning process, led by the attending physician.  Recommendations may be updated based on patient status, additional functional criteria and insurance authorization.    Assistance Recommended at Discharge Frequent or constant Supervision/Assistance  Patient can return home with the following  A lot of help with walking and/or transfers;A lot of help with bathing/dressing/bathroom   Equipment Recommendations  Wheelchair cushion (measurements OT);Wheelchair (measurements OT)    Recommendations for Other Services      Precautions / Restrictions Precautions Precautions: Fall;Other (comment) Precaution Comments: anxious Restrictions Weight Bearing Restrictions: Yes LLE Weight Bearing: Non weight bearing       Mobility Bed Mobility Overal bed mobility: Needs Assistance Bed Mobility: Supine to Sit     Supine to sit: Min assist     General bed mobility comments: light Min A for LLE support close to EOB    Transfers Overall transfer level: Needs assistance Equipment used: 1 person hand held assist Transfers: Sit to/from Stand, Bed to  chair/wheelchair/BSC Sit to Stand: Min assist Stand pivot transfers: Min assist         General transfer comment: Min A to stand from bed side with pt requesting elbow locking for pivot to recliner. use of trash can to elevate LLE during transfer to minimize pain     Balance Overall balance assessment: Needs assistance Sitting-balance support: No upper extremity supported, Feet unsupported Sitting balance-Leahy Scale: Fair     Standing balance support: Bilateral upper extremity supported, During functional activity Standing balance-Leahy Scale: Poor                             ADL either performed or assessed with clinical judgement   ADL Overall ADL's : Needs assistance/impaired     Grooming: Set up;Sitting;Oral care;Brushing hair Grooming Details (indicate cue type and reason): + shampoo cap seated in recliner at sink. required problem solvinig/assist to prop up LLE due to pain w/ dependent position                               General ADL Comments: Emphasis on problem solving LLE positioning, gradual improvements in dependent position tolerance for transfers/OOB tasks.    Extremity/Trunk Assessment Upper Extremity Assessment Upper Extremity Assessment: Generalized weakness   Lower Extremity Assessment Lower Extremity Assessment: Defer to PT evaluation        Vision   Vision Assessment?: No apparent visual deficits   Perception     Praxis      Cognition Arousal/Alertness: Awake/alert Behavior During Therapy: WFL for tasks assessed/performed, Anxious Overall Cognitive Status: Within Functional Limits for tasks  assessed                                 General Comments: pleasant, appropriate. still anxious with mobility and pain anticipation but improving        Exercises      Shoulder Instructions       General Comments      Pertinent Vitals/ Pain       Pain Assessment Pain Assessment: Faces Faces Pain Scale:  Hurts little more Pain Location: L LE Pain Descriptors / Indicators: Grimacing, Guarding Pain Intervention(s): Monitored during session, Premedicated before session  Home Living                                          Prior Functioning/Environment              Frequency  Min 1X/week        Progress Toward Goals  OT Goals(current goals can now be found in the care plan section)  Progress towards OT goals: Progressing toward goals  Acute Rehab OT Goals Patient Stated Goal: continued pain control. be able to reschedule and go on [place] trip OT Goal Formulation: With patient Time For Goal Achievement: 06/17/23 Potential to Achieve Goals: Good ADL Goals Pt Will Perform Lower Body Bathing: sit to/from stand;sitting/lateral leans;with min assist Pt Will Perform Lower Body Dressing: with min assist;sit to/from stand;sitting/lateral leans Pt Will Transfer to Toilet: with min assist;stand pivot transfer;bedside commode Pt Will Perform Toileting - Clothing Manipulation and hygiene: with min assist;sit to/from stand;sitting/lateral leans  Plan Discharge plan remains appropriate    Co-evaluation                 AM-PAC OT "6 Clicks" Daily Activity     Outcome Measure   Help from another person eating meals?: None Help from another person taking care of personal grooming?: A Little Help from another person toileting, which includes using toliet, bedpan, or urinal?: A Lot Help from another person bathing (including washing, rinsing, drying)?: A Lot Help from another person to put on and taking off regular upper body clothing?: A Little Help from another person to put on and taking off regular lower body clothing?: A Lot 6 Click Score: 16    End of Session    OT Visit Diagnosis: Unsteadiness on feet (R26.81);Other abnormalities of gait and mobility (R26.89);Muscle weakness (generalized) (M62.81)   Activity Tolerance Patient tolerated treatment  well;Patient limited by pain   Patient Left in chair;with call bell/phone within reach;with chair alarm set;Other (comment) (no nurse call cord in room)   Nurse Communication Mobility status        Time: 2956-2130 OT Time Calculation (min): 39 min  Charges: OT Treatments $Self Care/Home Management : 23-37 mins  Bradd Canary, OTR/L Acute Rehab Services Office: [phone]   Lorre Munroe 06/09/2023, 11:50 AM

## 2023-06-09 NOTE — Progress Notes (Signed)
PROGRESS NOTE    Haley White  WUJ:[id number] DOB: [birth year] DOA: 06/02/2023 PCP: Lucky Cowboy, MD   Brief Narrative:  67 y.o. female with with no reported significant past medical history, who is admitted to [facility] on 06/02/2023 with acute left trimalleolar ankle fracture after presenting from home to [facility] ED complaining of acute left ankle discomfort.   Admitted for pain control in the setting of left ankle fracture.  Initially no plans for surgery, eventually underwent ORIF on 7/31. Pain is better controlled, doing well.  Pending placement  Assessment & Plan:  Principal Problem:   Trimalleolar fracture of left ankle Active Problems:   Fall at home, initial encounter   Acute left ankle pain   Ankle fracture     Acute Trimalleolar Fracture Subluxation; Left Small Acute Avulsion Fracture of the Anterolateral Tibial Plafond S/p manual reduction under conscious sedation in the ED -Initially orthopedic recommended pain control and outpatient follow-up.  Eventually underwent ORIF on 7/31.  PT rec mended SNF.  Pending placement   COPD? -Continue home Dulera  Constipation - Improved with bowel regimen   Depression  Anxiety -Continue home Prozac and Wellbutrin   Dyslipidemia -Statin   GERD -PPI   Hypertension -Hold lisinopril while giving toradol.  IV as needed        DVT prophylaxis: Lovenox Code Status: Full code Family Communication: Her pastor at bedside Status is: Inpatient SNF placement      Diet Orders (From admission, onward)     Start     Ordered   06/05/23 0833  Diet 2 gram sodium Room service appropriate? Yes; Fluid consistency: Thin  Diet effective now       Question Answer Comment  Room service appropriate? Yes   Fluid consistency: Thin      06/05/23 0832            Subjective: Pain better controlled.  No complaints Examination:  Constitutional: Not in acute distress Respiratory: Clear to auscultation  bilaterally Cardiovascular: Normal sinus rhythm, no rubs Abdomen: Nontender nondistended good bowel sounds Musculoskeletal: Left lower extremity in splint Skin: No rashes seen Neurologic: CN 2-12 grossly intact.  And nonfocal Psychiatric: Normal judgment and insight. Alert and oriented x 3. Normal mood.  Objective: Vitals:   06/09/23 0544 06/09/23 0612 06/09/23 0748 06/09/23 0813  BP: 105/62  120/77   Pulse: 80  77   Resp: 18  17   Temp: 98.5 F (36.9 C)  97.7 F (36.5 C)   TempSrc: Oral  Oral   SpO2: 95%  99% 98%  Weight:  110.3 kg    Height:        Intake/Output Summary (Last 24 hours) at 06/09/2023 1037 Last data filed at 06/09/2023 0919 Gross per 24 hour  Intake 100 ml  Output 750 ml  Net -650 ml    Filed Weights   06/07/23 0425 06/08/23 0544 06/09/23 0612  Weight: 106.3 kg 109.6 kg 110.3 kg    Scheduled Meds:  buPROPion  300 mg Oral Daily   Chlorhexidine Gluconate Cloth  6 each Topical Daily   docusate sodium  100 mg Oral BID   enoxaparin (LOVENOX) injection  40 mg Subcutaneous Q24H   FLUoxetine  60 mg Oral Daily   mometasone-formoterol  2 puff Inhalation BID   montelukast  10 mg Oral QHS   pantoprazole  40 mg Oral Daily   polyethylene glycol  17 g Oral BID   rosuvastatin  40 mg Oral Daily   Continuous Infusions:  methocarbamol (ROBAXIN) IV 500 mg (06/09/23 0505)    Nutritional status     Body mass index is 41.74 kg/m.  Data Reviewed:   CBC: Recent Labs  Lab 06/02/23 2001 06/03/23 0054 06/04/23 0121 06/06/23 0639 06/07/23 0503 06/08/23 0406  WBC 9.7 9.0 7.8 7.6 7.0 6.6  NEUTROABS 5.6 5.7 5.0  --   --   --   HGB 11.6* 12.0 10.9* 9.7* 10.0* 10.3*  HCT 35.1* 36.1 34.2* 29.3* 30.5* 31.1*  MCV 95.6 98.4 98.3 97.7 95.9 97.8  PLT 255 231 219 227 251 256   Basic Metabolic Panel: Recent Labs  Lab 06/03/23 0054 06/04/23 0121 06/06/23 0639 06/07/23 0503 06/08/23 0406  NA 139 137 138 139 141  K 4.3 3.8 3.2* 3.5 3.7  CL 106 102 103 105 104   CO2 22 24 27 26 25   GLUCOSE 115* 134* 120* 113* 107*  BUN 18 20 17 15 16   CREATININE 0.98 0.95 0.96 1.03* 0.84  CALCIUM 8.6* 8.6* 8.5* 8.6* 8.8*  MG 1.8 2.0 1.8 1.9 2.3  PHOS  --  4.8*  --   --   --    GFR: Estimated Creatinine Clearance: 80 mL/min (by C-G formula based on SCr of 0.84 mg/dL). Liver Function Tests: Recent Labs  Lab 06/02/23 2001 06/03/23 0054 06/04/23 0121  AST 23 22 26   ALT 27 29 25   ALKPHOS 51 56 56  BILITOT 1.1 1.3* 1.3*  PROT 5.8* 6.1* 5.8*  ALBUMIN 3.7 3.9 3.6   No results for input(s): "LIPASE", "AMYLASE" in the last 168 hours. No results for input(s): "AMMONIA" in the last 168 hours. Coagulation Profile: Recent Labs  Lab 06/03/23 0054  INR 1.0   Cardiac Enzymes: No results for input(s): "CKTOTAL", "CKMB", "CKMBINDEX", "TROPONINI" in the last 168 hours. BNP (last 3 results) No results for input(s): "PROBNP" in the last 8760 hours. HbA1C: No results for input(s): "HGBA1C" in the last 72 hours. CBG: Recent Labs  Lab 06/07/23 1805 06/08/23 0751 06/08/23 1718 06/08/23 2042 06/09/23 0658  GLUCAP 103* 107* 114* 113* 126*   Lipid Profile: No results for input(s): "CHOL", "HDL", "LDLCALC", "TRIG", "CHOLHDL", "LDLDIRECT" in the last 72 hours. Thyroid Function Tests: No results for input(s): "TSH", "T4TOTAL", "FREET4", "T3FREE", "THYROIDAB" in the last 72 hours. Anemia Panel: No results for input(s): "VITAMINB12", "FOLATE", "FERRITIN", "TIBC", "IRON", "RETICCTPCT" in the last 72 hours. Sepsis Labs: No results for input(s): "PROCALCITON", "LATICACIDVEN" in the last 168 hours.  Recent Results (from the past 240 hour(s))  Surgical PCR screen     Status: None   Collection Time: 06/03/23  4:37 AM   Specimen: Nasal Mucosa; Nasal Swab  Result Value Ref Range Status   MRSA, PCR NEGATIVE NEGATIVE Final   Staphylococcus aureus NEGATIVE NEGATIVE Final    Comment: (NOTE) The Xpert SA Assay (FDA approved for NASAL specimens in patients 52 years of age  and older), is one component of a comprehensive surveillance program. It is not intended to diagnose infection nor to guide or monitor treatment. Performed at [facility], [address], [place], [place] [address]          Radiology Studies: No results found.         LOS: 6 days   Time spent= 35 mins    Jakwon Gayton Joline Maxcy, MD [facility]  If 7PM-7AM, please contact night-coverage  06/09/2023, 10:37 AM

## 2023-06-09 NOTE — TOC Progression Note (Signed)
Transition of Care [facility]) - Progression Note    Patient Details  Name: Haley White MRN: [id number] Date of Birth: 03-29-1956  [facility] CM/SW Contact  Carolan Avedisian A [place], [place] Phone Number: 06/09/2023, 4:19 PM  Clinical Narrative:     CSW met with pt at bedside to provide bed offers. CSW will follow up with pt to discuss bed choice.   TOC will continue to follow.   Expected Discharge Plan: Home w Home Health Services Barriers to Discharge: Continued Medical Work up  Expected Discharge Plan and Services   Discharge Planning Services: CM Consult   Living arrangements for the past 2 months: Single Family Home Expected Discharge Date: 06/07/23                                     Social Determinants of Health (SDOH) Interventions SDOH Screenings   Food Insecurity: Patient Declined (06/03/2023)  Housing: Low Risk  (06/02/2023)  Transportation Needs: No Transportation Needs (06/02/2023)  Utilities: Not At Risk (06/03/2023)  Tobacco Use: Low Risk  (06/04/2023)    Readmission Risk Interventions     No data to display

## 2023-06-10 DIAGNOSIS — S82852D Displaced trimalleolar fracture of left lower leg, subsequent encounter for closed fracture with routine healing: Secondary | ICD-10-CM | POA: Diagnosis not present

## 2023-06-10 MED ORDER — METHOCARBAMOL 500 MG PO TABS
500.0000 mg | ORAL_TABLET | Freq: Four times a day (QID) | ORAL | Status: DC
  Administered 2023-06-10 – 2023-06-11 (×3): 500 mg via ORAL
  Filled 2023-06-10 (×3): qty 1

## 2023-06-10 NOTE — Progress Notes (Signed)
   06/10/23 0021  BiPAP/CPAP/SIPAP  $ Non-Invasive Home Ventilator  Subsequent  BiPAP/CPAP/SIPAP Pt Type Adult  BiPAP/CPAP/SIPAP Resmed  Reason BIPAP/CPAP not in use  (pt said will put on cpap when ready.)

## 2023-06-10 NOTE — Plan of Care (Signed)

## 2023-06-10 NOTE — Progress Notes (Signed)
Physical Therapy Treatment Patient Details Name: Haley White MRN: [id number] DOB: February 09, 1956 Today's Date: 06/10/2023   History of Present Illness 67 y/o female adm 7/29 after fall with Lt trimalleolar ankle fracture. 7/31 ORIF. NWB. No significant PMhx    PT Comments  Pt greeted resting in bed and eager for OOB mobility with steady progress towards acute goals. Pt continues to be limited by pain, RN giving meds during session as well as some anxiousness with anticipation of pain.  With increased time pt able to self mobilize LLE to and off EOB with gait belt used as leg lifter and min A to manage lowering LLE to floor. Pt able to rise to stand with min A and RW support, unable to stand pivot to recliner this session due to pain and pt needing to return to sitting. Pt able to squat pivot without AD to L with min A with good advancement around RLE. Educated pt on chair push ups for UE strength maintenance  and pressure relief strategy as well as recommended frequency with pt able to demon back x5. Current plan remains appropriate to address deficits and maximize functional independence and decrease caregiver burden. Pt continues to benefit from skilled PT services to progress toward functional mobility goals.      If plan is discharge home, recommend the following: A lot of help with walking and/or transfers;A little help with bathing/dressing/bathroom   Can travel by private vehicle     No  Equipment Recommendations  Wheelchair (measurements PT);Wheelchair cushion (measurements PT)    Recommendations for Other Services       Precautions / Restrictions Precautions Precautions: Fall;Other (comment) Precaution Comments: anxious Restrictions Weight Bearing Restrictions: Yes LLE Weight Bearing: Non weight bearing     Mobility  Bed Mobility Overal bed mobility: Needs Assistance Bed Mobility: Supine to Sit     Supine to sit: Min assist     General bed mobility comments: light Min A for  LLE support close to EOB, pt able to begin to self mobilize LLE with gait belt as leg lifter, gait belt issued to pt at end of session    Transfers Overall transfer level: Needs assistance Equipment used: 1 person hand held assist, Rolling walker (2 wheels) Transfers: Sit to/from Stand, Bed to chair/wheelchair/BSC Sit to Stand: Min assist     Squat pivot transfers: Min assist     General transfer comment: Min A to stand from bed side with RW for support, pt unable to begin pivto to recliner as pt with increased pain with LLE in dependent position, pt able to return to sit and sqaut pivot without AD to L    Ambulation/Gait Ambulation/Gait assistance:  (did not assess, increased pain noted with transfer.)             General Gait Details: unable   Stairs             Wheelchair Mobility     Tilt Bed    Modified Rankin (Stroke Patients Only)       Balance Overall balance assessment: Needs assistance Sitting-balance support: No upper extremity supported, Feet unsupported Sitting balance-Leahy Scale: Fair     Standing balance support: Bilateral upper extremity supported, During functional activity Standing balance-Leahy Scale: Poor Standing balance comment: reliant on external support to maintain standing balance                            Cognition Arousal/Alertness: Awake/alert Behavior During  Therapy: WFL for tasks assessed/performed, Anxious Overall Cognitive Status: Within Functional Limits for tasks assessed                                 General Comments: pleasant, appropriate. still anxious with mobility and pain anticipation but improving, encouraged purse lipe breathing throughout mobility        Exercises General Exercises - Lower Extremity Quad Sets: AROM, Seated, 5 reps, Left Heel Slides: AROM, Left, 10 reps, Seated Other Exercises Other Exercises: long axis internal/external rotation LLE x10 Other Exercises:  chair pushups x5 for UE strength and pressure relief    General Comments General comments (skin integrity, edema, etc.): VSS on RA, encourage breathing techniques throughout session      Pertinent Vitals/Pain Pain Assessment Pain Assessment: Faces Faces Pain Scale: Hurts whole lot Pain Location: L LE Pain Descriptors / Indicators: Grimacing, Guarding Pain Intervention(s): Limited activity within patient's tolerance, Monitored during session, Patient requesting pain meds-RN notified, RN gave pain meds during session, Repositioned    Home Living                          Prior Function            PT Goals (current goals can now be found in the care plan section) Acute Rehab PT Goals Patient Stated Goal: be able to go to [place] PT Goal Formulation: With patient/family Time For Goal Achievement: 06/19/23 Progress towards PT goals: Progressing toward goals    Frequency    Min 1X/week      PT Plan Current plan remains appropriate    Co-evaluation              AM-PAC PT "6 Clicks" Mobility   Outcome Measure  Help needed turning from your back to your side while in a flat bed without using bedrails?: A Little Help needed moving from lying on your back to sitting on the side of a flat bed without using bedrails?: A Little Help needed moving to and from a bed to a chair (including a wheelchair)?: A Little Help needed standing up from a chair using your arms (e.g., wheelchair or bedside chair)?: A Little Help needed to walk in hospital room?: Total Help needed climbing 3-5 steps with a railing? : Total 6 Click Score: 14    End of Session Equipment Utilized During Treatment: Gait belt Activity Tolerance: Patient tolerated treatment well;Patient limited by pain Patient left: in chair;with call bell/phone within reach Nurse Communication: Mobility status;Other (comment) (RN giving meds during session) PT Visit Diagnosis: Pain;Other abnormalities of gait and  mobility (R26.89) Pain - Right/Left: Left Pain - part of body: Leg     Time: 2202-5427 PT Time Calculation (min) (ACUTE ONLY): 29 min  Charges:    $Therapeutic Activity: 23-37 mins PT General Charges $$ ACUTE PT VISIT: 1 Visit                     Darren Caldron R. PTA Acute Rehabilitation Services Office: [phone]   Catalina Antigua 06/10/2023, 11:37 AM

## 2023-06-10 NOTE — Discharge Summary (Addendum)
Physician Discharge Summary  Keambria Reeter QMV:[id number] DOB: 03/08/1956 DOA: 06/02/2023  PCP: Lucky Cowboy, MD  Admit date: 06/02/2023 Discharge date: 06/13/2023  Admitted From: Home Disposition:  SNF  Recommendations for Outpatient Follow-up:  Follow up with PCP in 1-2 weeks Please obtain BMP/CBC in one week your next doctors visit.  Pain medication with bowel regimen prescribed Outpatient follow-up per orthopedic-[facility]   Discharge Condition: Stable CODE STATUS: Full code Diet recommendation: Regular.  Brief/Interim Summary: 67 y.o. female with with no reported significant past medical history, who is admitted to [facility] on 06/02/2023 with acute left trimalleolar ankle fracture after presenting from home to [facility] ED complaining of acute left ankle discomfort.   Admitted for pain control in the setting of left ankle fracture.  Initially no plans for surgery, eventually underwent ORIF on 7/31. Pain better controlled, pending placement  Addendum on 06/13/2023 Patient still waiting to be discharged to SNF once bed is available. She is currently medically stable for discharge.    Assessment & Plan:  Principal Problem:   Trimalleolar fracture of left ankle Active Problems:   Fall at home, initial encounter   Acute left ankle pain   Ankle fracture      Acute Trimalleolar Fracture Subluxation; Left Small Acute Avulsion Fracture of the Anterolateral Tibial Plafond S/p manual reduction under conscious sedation in the ED -Initially orthopedic recommended pain control and outpatient follow-up.  Eventually underwent ORIF on 7/31.  PT recommended SNF.  Okay to transition to SNF whenever bed available   COPD? -Continue home Dulera   Constipation - Improved with bowel regimen   Depression  Anxiety -Continue home Prozac and Wellbutrin   Dyslipidemia -Statin   GERD -PPI   Hypertension - Resume home meds  Consultations: [facility], Dr.  Odis Hollingshead  Subjective: No complaints feeling okay.  Discharge Exam: Vitals:   06/10/23 0536 06/10/23 0743  BP: 115/65 110/88  Pulse: 77 84  Resp: 16 19  Temp:  98 F (36.7 C)  SpO2: 95% 93%   Vitals:   06/10/23 0418 06/10/23 0500 06/10/23 0536 06/10/23 0743  BP:  115/65 115/65 110/88  Pulse:  77 77 84  Resp:  16 16 19   Temp:  97.8 F (36.6 C)  98 F (36.7 C)  TempSrc:  Oral  Oral  SpO2:  95% 95% 93%  Weight: 110.4 kg     Height:        General: Pt is alert, awake, not in acute distress Cardiovascular: RRR, S1/S2 +, no rubs, no gallops Respiratory: CTA bilaterally, no wheezing, no rhonchi Abdominal: Soft, NT, ND, bowel sounds + Extremities: no edema, no cyanosis.  Left lower extremity dressing/splint in place  Discharge Instructions   Allergies as of 06/10/2023       Reactions   Molds & Smuts Anaphylaxis   Biaxin [clarithromycin] Other (See Comments)   GI Upset   Prednisone Other (See Comments)   Suicidal   Tequin [gatifloxacin] Other (See Comments)   GI upset, Thrush   Serevent [salmeterol] Palpitations        Medication List     STOP taking these medications    tizanidine 2 MG capsule Commonly known as: ZANAFLEX       TAKE these medications    albuterol 108 (90 Base) MCG/ACT inhaler Commonly known as: VENTOLIN HFA Inhale 2 puffs into the lungs every 6 (six) hours as needed for wheezing or shortness of breath.   budesonide 0.5 MG/2ML nebulizer solution Commonly known as: PULMICORT Take 0.5  mg by nebulization daily as needed.   budesonide-formoterol 160-4.5 MCG/ACT inhaler Commonly known as: SYMBICORT Inhale 2 puffs into the lungs daily as needed (wheezing).   buPROPion 300 MG 24 hr tablet Commonly known as: WELLBUTRIN XL Take 300 mg by mouth daily.   cholecalciferol 25 MCG (1000 UNIT) tablet Commonly known as: VITAMIN D3 Take 1,000 Units by mouth daily.   docusate sodium 100 MG capsule Commonly known as: COLACE Take 1 capsule (100  mg total) by mouth 2 (two) times daily.   fexofenadine 180 MG tablet Commonly known as: ALLEGRA Take 180 mg by mouth daily as needed for allergies.   FLUoxetine 20 MG capsule Commonly known as: PROZAC Take 60 mg by mouth daily.   fluticasone 50 MCG/ACT nasal spray Commonly known as: FLONASE Place 1 spray into both nostrils daily as needed for allergies or rhinitis.   lisinopril 20 MG tablet Commonly known as: ZESTRIL Take 20 mg by mouth daily.   methocarbamol 500 MG tablet Commonly known as: ROBAXIN Take 1 tablet (500 mg total) by mouth every 8 (eight) hours as needed for muscle spasms.   methylphenidate 10 MG 24 hr capsule Commonly known as: RITALIN LA Take 1 capsule (10 mg total) by mouth daily.   montelukast 10 MG tablet Commonly known as: SINGULAIR Take 10 mg by mouth at bedtime.   NON FORMULARY Inject 1 Dose into the skin once a week. Allergy shots from [facility] [place]. Patient not sure about the name of medicine   oxyCODONE 5 MG immediate release tablet Commonly known as: Oxy IR/ROXICODONE Take 1-2 tablets (5-10 mg total) by mouth every 4 (four) hours as needed for moderate pain, severe pain or breakthrough pain.   pantoprazole 40 MG tablet Commonly known as: PROTONIX Take 40 mg by mouth daily.   polyethylene glycol 17 g packet Commonly known as: MIRALAX / GLYCOLAX Take 17 g by mouth 2 (two) times daily.   rosuvastatin 40 MG tablet Commonly known as: CRESTOR Take 40 mg by mouth daily.   traMADol 50 MG tablet Commonly known as: ULTRAM Take 1 tablet (50 mg total) by mouth every 8 (eight) hours as needed for moderate pain. What changed:  when to take this reasons to take this        Follow-up Information     Netta Cedars, MD .   Specialty: Orthopedic Surgery Why: follow up on broken fibula Contact information: [address], [address] [place] [address] [phone]         [facility] Emergency Department at  [facility] .   Specialty: Emergency Medicine Why: If symptoms worsen Contact information: [address] [name] [address] [phone]        Lucky Cowboy, MD Follow up in 1 week(s).   Specialty: Internal Medicine Contact information: [address] [place] [address] [phone]                Allergies  Allergen Reactions   Molds & Smuts Anaphylaxis   Biaxin [Clarithromycin] Other (See Comments)    GI Upset   Prednisone Other (See Comments)    Suicidal   Tequin [Gatifloxacin] Other (See Comments)    GI upset, Thrush   Serevent [Salmeterol] Palpitations    You were cared for by a hospitalist during your hospital stay. If you have any questions about your discharge medications or the care you received while you were in the hospital after you are discharged, you can call  the unit and asked to speak with the hospitalist on call if the hospitalist that took care of you is not available. Once you are discharged, your primary care physician will handle any further medical issues. Please note that no refills for any discharge medications will be authorized once you are discharged, as it is imperative that you return to your primary care physician (or establish a relationship with a primary care physician if you do not have one) for your aftercare needs so that they can reassess your need for medications and monitor your lab values.  You were cared for by a hospitalist during your hospital stay. If you have any questions about your discharge medications or the care you received while you were in the hospital after you are discharged, you can call the unit and asked to speak with the hospitalist on call if the hospitalist that took care of you is not available. Once you are discharged, your primary care physician will handle any further medical issues. Please note that NO REFILLS for any discharge medications will be authorized  once you are discharged, as it is imperative that you return to your primary care physician (or establish a relationship with a primary care physician if you do not have one) for your aftercare needs so that they can reassess your need for medications and monitor your lab values.  Please request your Prim.MD to go over all Hospital Tests and Procedure/Radiological results at the follow up, please get all Hospital records sent to your Prim MD by signing hospital release before you go home.  Get CBC, CMP, 2 view Chest X ray checked  by Primary MD during your next visit or SNF MD in 5-7 days ( we routinely change or add medications that can affect your baseline labs and fluid status, therefore we recommend that you get the mentioned basic workup next visit with your PCP, your PCP may decide not to get them or add new tests based on their clinical decision)  On your next visit with your primary care physician please Get Medicines reviewed and adjusted.  If you experience worsening of your admission symptoms, develop shortness of breath, life threatening emergency, suicidal or homicidal thoughts you must seek medical attention immediately by calling 911 or calling your MD immediately  if symptoms less severe.  You Must read complete instructions/literature along with all the possible adverse reactions/side effects for all the Medicines you take and that have been prescribed to you. Take any new Medicines after you have completely understood and accpet all the possible adverse reactions/side effects.   Do not drive, operate heavy machinery, perform activities at heights, swimming or participation in water activities or provide baby sitting services if your were admitted for syncope or siezures until you have seen by Primary MD or a Neurologist and advised to do so again.  Do not drive when taking Pain medications.   Procedures/Studies: DG Ankle Complete Left  Result Date: 06/05/2023 CLINICAL DATA:   Ankle surgery EXAM: LEFT ANKLE COMPLETE - 3+ VIEW COMPARISON:  06/02/2023 FINDINGS: Six low resolution intraoperative spot views of the left ankle. Total fluoroscopy time was 2 minutes 52 seconds, fluoroscopic dose of 4 mGy. Images demonstrate trimalleolar fracture with lateral subluxation of the talus. Subsequent surgical plate and fixating screws at the distal fibula and tibia with near anatomic alignment. IMPRESSION: Intraoperative fluoroscopic assistance provided during left ankle surgery. Electronically Signed   By: Jasmine Pang M.D.   On: 06/05/2023 00:18   DG C-Arm  1-60 Min-No Report  Result Date: 06/04/2023 Fluoroscopy was utilized by the requesting physician.  No radiographic interpretation.   DG C-Arm 1-60 Min-No Report  Result Date: 06/04/2023 Fluoroscopy was utilized by the requesting physician.  No radiographic interpretation.   CT Ankle Left Wo Contrast  Result Date: 06/02/2023 CLINICAL DATA:  Left ankle fracture. EXAM: CT OF THE LEFT ANKLE WITHOUT CONTRAST TECHNIQUE: Multidetector CT imaging of the left ankle was performed according to the standard protocol. Multiplanar CT image reconstructions were also generated. RADIATION DOSE REDUCTION: This exam was performed according to the departmental dose-optimization program which includes automated exposure control, adjustment of the mA and/or kV according to patient size and/or use of iterative reconstruction technique. COMPARISON:  Left ankle x-rays from same day. FINDINGS: Bones/Joint/Cartilage Acute comminuted fracture involving the base of the medial malleolus with 9 mm medial displacement. Acute oblique fracture of the distal fibular metadiaphysis with 4 mm lateral displacement and mild overriding. Acute minimally displaced longitudinal fracture of the posterior malleolus. Small acute avulsion fracture of the anterolateral tibial plafond. Lateral subluxation of the talus with respect to the tibial plafond. Midfoot degenerative changes.  No joint effusion. Ligaments Ligaments are suboptimally evaluated by CT. Muscles and Tendons Grossly intact. Prominent thickening of the peroneal longus and distal Achilles tendons, likely reflecting tendinosis. Soft tissue Diffuse soft tissue swelling. No fluid collection or hematoma. No soft tissue mass. IMPRESSION: 1. Acute trimalleolar fracture-subluxation as described above. 2. Additional small acute avulsion fracture of the anterolateral tibial plafond. Electronically Signed   By: Obie Dredge M.D.   On: 06/02/2023 17:05   DG Ankle 2 Views Left  Result Date: 06/02/2023 CLINICAL DATA:  Post splinting EXAM: LEFT ANKLE - 2 VIEW COMPARISON:  Left tib-fib radiographs-earlier same day FINDINGS: Fine bony detail is degraded secondary to overlying casting material Improved alignment of previously noted comminuted trimalleolar ankle fracture with residual deformity at the fracture sites. No new fracture or dislocation. No radiopaque foreign body. IMPRESSION: Improved alignment of previously noted comminuted trimalleolar ankle fracture with residual deformity at the fracture sites. Electronically Signed   By: Simonne Come M.D.   On: 06/02/2023 14:45   DG Tibia/Fibula Left  Result Date: 06/02/2023 CLINICAL DATA:  Fall.  Pain. EXAM: LEFT ANKLE - 2 VIEW; LEFT TIBIA AND FIBULA - 2 VIEW; LEFT FOOT - 2 VIEW COMPARISON:  Left foot radiographs 04/10/2023, left ankle radiographs 10/17/2021, MRI left ankle 09/08/2021 FINDINGS: Left ankle: There is an acute, comminuted fracture of the distal fibular diaphysis with lateral angulation of the distal fibular component and up to approximately 1.2 cm lateral displacement of the distal fibular component with respect of the proximal fibular component. Approx There is associated lateral dislocation of the talus and distal fibula with respect to the tibia and proximal fibula. There is a curvilinear fracture extending through to portions of the distal medial tibial metaphyseal  through the epiphyseal cortex with up to 2 mm diastasis/cortical step-off. Possible mild anterior subluxation of the talus with respect of the tibia on lateral view. -- Left foot: Mild hallux valgus. Moderate dorsal talonavicular, naviculocuneiform, and tarsometatarsal degenerative osteophytosis with moderate joint space narrowing. Left tibia and fibula: No additional more proximal tibia or fibula acute fracture is seen. 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 IMPRESSION: 1. Acute, comminuted fracture of the distal fibular diaphysis with lateral angulation of the distal fibular component and up to approximately 1.2 cm lateral displacement of the distal fibular component with respect of the proximal fibular component.  2. Lateral dislocation of the talus and the distal fibula fracture component with respect to the tibia and proximal fibular fracture component. 3. Acute, curvilinear fracture at the distal medial aspect of the tibia with up to 2 mm diastasis/cortical step-off. 4. Possible mild anterior subluxation of the talus with respect of the tibia on lateral view. 5. Moderate midfoot osteoarthritis. Electronically Signed   By: Neita Garnet M.D.   On: 06/02/2023 13:14   DG Ankle 2 Views Left  Result Date: 06/02/2023 CLINICAL DATA:  Fall.  Pain. EXAM: LEFT ANKLE - 2 VIEW; LEFT TIBIA AND FIBULA - 2 VIEW; LEFT FOOT - 2 VIEW COMPARISON:  Left foot radiographs 04/10/2023, left ankle radiographs 10/17/2021, MRI left ankle 09/08/2021 FINDINGS: Left ankle: There is an acute, comminuted fracture of the distal fibular diaphysis with lateral angulation of the distal fibular component and up to approximately 1.2 cm lateral displacement of the distal fibular component with respect of the proximal fibular component. Approx There is associated lateral dislocation of the talus and distal fibula with respect to the tibia and proximal fibula. There is a curvilinear fracture extending through  to portions of the distal medial tibial metaphyseal through the epiphyseal cortex with up to 2 mm diastasis/cortical step-off. Possible mild anterior subluxation of the talus with respect of the tibia on lateral view. -- Left foot: Mild hallux valgus. Moderate dorsal talonavicular, naviculocuneiform, and tarsometatarsal degenerative osteophytosis with moderate joint space narrowing. Left tibia and fibula: No additional more proximal tibia or fibula acute fracture is seen. 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 IMPRESSION: 1. Acute, comminuted fracture of the distal fibular diaphysis with lateral angulation of the distal fibular component and up to approximately 1.2 cm lateral displacement of the distal fibular component with respect of the proximal fibular component. 2. Lateral dislocation of the talus and the distal fibula fracture component with respect to the tibia and proximal fibular fracture component. 3. Acute, curvilinear fracture at the distal medial aspect of the tibia with up to 2 mm diastasis/cortical step-off. 4. Possible mild anterior subluxation of the talus with respect of the tibia on lateral view. 5. Moderate midfoot osteoarthritis. Electronically Signed   By: Neita Garnet M.D.   On: 06/02/2023 13:14   DG Foot 2 Views Left  Result Date: 06/02/2023 CLINICAL DATA:  Fall.  Pain. EXAM: LEFT ANKLE - 2 VIEW; LEFT TIBIA AND FIBULA - 2 VIEW; LEFT FOOT - 2 VIEW COMPARISON:  Left foot radiographs 04/10/2023, left ankle radiographs 10/17/2021, MRI left ankle 09/08/2021 FINDINGS: Left ankle: There is an acute, comminuted fracture of the distal fibular diaphysis with lateral angulation of the distal fibular component and up to approximately 1.2 cm lateral displacement of the distal fibular component with respect of the proximal fibular component. Approx There is associated lateral dislocation of the talus and distal fibula with respect to the tibia and proximal fibula.  There is a curvilinear fracture extending through to portions of the distal medial tibial metaphyseal through the epiphyseal cortex with up to 2 mm diastasis/cortical step-off. Possible mild anterior subluxation of the talus with respect of the tibia on lateral view. -- Left foot: Mild hallux valgus. Moderate dorsal talonavicular, naviculocuneiform, and tarsometatarsal degenerative osteophytosis with moderate joint space narrowing. Left tibia and fibula: No additional more proximal tibia or fibula acute fracture is seen. 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 IMPRESSION: 1. Acute, comminuted fracture of the distal fibular diaphysis with lateral angulation of the distal fibular component  and up to approximately 1.2 cm lateral displacement of the distal fibular component with respect of the proximal fibular component. 2. Lateral dislocation of the talus and the distal fibula fracture component with respect to the tibia and proximal fibular fracture component. 3. Acute, curvilinear fracture at the distal medial aspect of the tibia with up to 2 mm diastasis/cortical step-off. 4. Possible mild anterior subluxation of the talus with respect of the tibia on lateral view. 5. Moderate midfoot osteoarthritis. Electronically Signed   By: Neita Garnet M.D.   On: 06/02/2023 13:14     The results of significant diagnostics from this hospitalization (including imaging, microbiology, ancillary and laboratory) are listed below for reference.     Microbiology: Recent Results (from the past 240 hour(s))  Surgical PCR screen     Status: None   Collection Time: 06/03/23  4:37 AM   Specimen: Nasal Mucosa; Nasal Swab  Result Value Ref Range Status   MRSA, PCR NEGATIVE NEGATIVE Final   Staphylococcus aureus NEGATIVE NEGATIVE Final    Comment: (NOTE) The Xpert SA Assay (FDA approved for NASAL specimens in patients 8 years of age and older), is one component of a  comprehensive surveillance program. It is not intended to diagnose infection nor to guide or monitor treatment. Performed at [facility], [address], [place], [place] [address]      Labs: BNP (last 3 results) No results for input(s): "BNP" in the last 8760 hours. Basic Metabolic Panel: Recent Labs  Lab 06/04/23 0121 06/06/23 0639 06/07/23 0503 06/08/23 0406 06/10/23 0015  NA 137 138 139 141 137  K 3.8 3.2* 3.5 3.7 4.0  CL 102 103 105 104 105  CO2 24 27 26 25 24   GLUCOSE 134* 120* 113* 107* 97  BUN 20 17 15 16 12   CREATININE 0.95 0.96 1.03* 0.84 1.00  CALCIUM 8.6* 8.5* 8.6* 8.8* 8.6*  MG 2.0 1.8 1.9 2.3 2.0  PHOS 4.8*  --   --   --   --    Liver Function Tests: Recent Labs  Lab 06/04/23 0121  AST 26  ALT 25  ALKPHOS 56  BILITOT 1.3*  PROT 5.8*  ALBUMIN 3.6   No results for input(s): "LIPASE", "AMYLASE" in the last 168 hours. No results for input(s): "AMMONIA" in the last 168 hours. CBC: Recent Labs  Lab 06/04/23 0121 06/06/23 0639 06/07/23 0503 06/08/23 0406 06/10/23 0015  WBC 7.8 7.6 7.0 6.6 7.2  NEUTROABS 5.0  --   --   --   --   HGB 10.9* 9.7* 10.0* 10.3* 10.3*  HCT 34.2* 29.3* 30.5* 31.1* 31.8*  MCV 98.3 97.7 95.9 97.8 99.1  PLT 219 227 251 256 286   Cardiac Enzymes: No results for input(s): "CKTOTAL", "CKMB", "CKMBINDEX", "TROPONINI" in the last 168 hours. BNP: Invalid input(s): "POCBNP" CBG: Recent Labs  Lab 06/07/23 1805 06/08/23 0751 06/08/23 1718 06/08/23 2042 06/09/23 0658  GLUCAP 103* 107* 114* 113* 126*   D-Dimer No results for input(s): "DDIMER" in the last 72 hours. Hgb A1c No results for input(s): "HGBA1C" in the last 72 hours. Lipid Profile No results for input(s): "CHOL", "HDL", "LDLCALC", "TRIG", "CHOLHDL", "LDLDIRECT" in the last 72 hours. Thyroid function studies No results for input(s): "TSH", "T4TOTAL", "T3FREE", "THYROIDAB" in the last 72 hours.  Invalid input(s): "FREET3" Anemia work up No results for  input(s): "VITAMINB12", "FOLATE", "FERRITIN", "TIBC", "IRON", "RETICCTPCT" in the last 72 hours. Urinalysis    Component Value Date/Time   COLORURINE YELLOW 06/03/2023  0641   APPEARANCEUR CLEAR 06/03/2023 0641   LABSPEC 1.025 06/03/2023 0641   PHURINE 5.0 06/03/2023 0641   GLUCOSEU NEGATIVE 06/03/2023 0641   HGBUR NEGATIVE 06/03/2023 0641   BILIRUBINUR NEGATIVE 06/03/2023 0641   KETONESUR NEGATIVE 06/03/2023 0641   PROTEINUR NEGATIVE 06/03/2023 0641   NITRITE NEGATIVE 06/03/2023 0641   LEUKOCYTESUR NEGATIVE 06/03/2023 0641   Sepsis Labs Recent Labs  Lab 06/06/23 0639 06/07/23 0503 06/08/23 0406 06/10/23 0015  WBC 7.6 7.0 6.6 7.2   Microbiology Recent Results (from the past 240 hour(s))  Surgical PCR screen     Status: None   Collection Time: 06/03/23  4:37 AM   Specimen: Nasal Mucosa; Nasal Swab  Result Value Ref Range Status   MRSA, PCR NEGATIVE NEGATIVE Final   Staphylococcus aureus NEGATIVE NEGATIVE Final    Comment: (NOTE) The Xpert SA Assay (FDA approved for NASAL specimens in patients 27 years of age and older), is one component of a comprehensive surveillance program. It is not intended to diagnose infection nor to guide or monitor treatment. Performed at [facility], [address], [place], [place] [address]      Time coordinating discharge:  I have spent 35 minutes face to face with the patient and on the ward discussing the patients care, assessment, plan and disposition with other care givers. >50% of the time was devoted counseling the patient about the risks and benefits of treatment/Discharge disposition and coordinating care.   SIGNED:   Dimple Nanas, MD  [facility] 06/10/2023, 10:31 AM   If 7PM-7AM, please contact night-coverage

## 2023-06-10 NOTE — TOC Progression Note (Signed)
Transition of Care [facility]) - Progression Note    Patient Details  Name: Haley White MRN: [id number] Date of Birth: 1956-03-01  Transition of Care [facility]) CM/SW Contact  Rajah Lamba A [place], [place] Phone Number: 06/10/2023, 4:06 PM  Clinical Narrative:     CSW met with pt at bedside to get update on bed choice from pt. She stated that she was interested in [facility]. CSW will follow up with facility regarding bed availability and start auth closer to pt's medical stability.   TOC will continue to follow.   Expected Discharge Plan: Home w Home Health Services Barriers to Discharge: Continued Medical Work up  Expected Discharge Plan and Services   Discharge Planning Services: CM Consult   Living arrangements for the past 2 months: Single Family Home Expected Discharge Date: 06/07/23                                     Social Determinants of Health (SDOH) Interventions SDOH Screenings   Food Insecurity: Patient Declined (06/03/2023)  Housing: Low Risk  (06/02/2023)  Transportation Needs: No Transportation Needs (06/02/2023)  Utilities: Not At Risk (06/03/2023)  Tobacco Use: Low Risk  (06/04/2023)    Readmission Risk Interventions     No data to display

## 2023-06-11 ENCOUNTER — Encounter (HOSPITAL_COMMUNITY): Payer: Self-pay | Admitting: Orthopaedic Surgery

## 2023-06-11 MED ORDER — METHOCARBAMOL 500 MG PO TABS
1000.0000 mg | ORAL_TABLET | Freq: Four times a day (QID) | ORAL | Status: DC
  Administered 2023-06-11 – 2023-06-13 (×9): 1000 mg via ORAL
  Filled 2023-06-11 (×9): qty 2

## 2023-06-11 MED ORDER — METHOCARBAMOL 500 MG PO TABS: 500.0000 mg | ORAL_TABLET | Freq: Once | ORAL | Status: DC

## 2023-06-11 NOTE — TOC Progression Note (Addendum)
Transition of Care [facility]) - Progression Note    Patient Details  Name: Haley White MRN: [id number] Date of Birth: 26-Jan-1956  Transition of Care [facility]) CM/SW Contact  Jaceon Heiberger A [place], [place] Phone Number: 06/11/2023, 11:43 AM  Clinical Narrative:     Update 1222:   Auth started for pt, bed availability to [facility] pending.  Monia Pouch YQI#[phone]   CSW contacted Velna Hatchet at [facility] regarding pt's selection for SNF. She said she would reach back out to CSW with bed availability.     Expected Discharge Plan: Home w Home Health Services Barriers to Discharge: Continued Medical Work up  Expected Discharge Plan and Services   Discharge Planning Services: CM Consult   Living arrangements for the past 2 months: Single Family Home Expected Discharge Date: 06/11/23                                     Social Determinants of Health (SDOH) Interventions SDOH Screenings   Food Insecurity: Patient Declined (06/03/2023)  Housing: Low Risk  (06/02/2023)  Transportation Needs: No Transportation Needs (06/02/2023)  Utilities: Not At Risk (06/03/2023)  Tobacco Use: Low Risk  (06/04/2023)    Readmission Risk Interventions     No data to display

## 2023-06-11 NOTE — Plan of Care (Signed)

## 2023-06-11 NOTE — Progress Notes (Signed)
Physical Therapy Treatment Patient Details Name: Haley White MRN: [id number] DOB: 15-Apr-1956 Today's Date: 06/11/2023   History of Present Illness 67 y/o female adm 7/29 after fall with Lt trimalleolar ankle fracture. 7/31 ORIF. NWB. No significant PMhx    PT Comments  Pt greeted resting in bed and agreeable to session however pt limited this session by pain. Pt able to come to sitting EOB without assist this session and transfer to stand with RW for support with min A, however unable to maintain standing due to LLE pain in dependent positioning with pt needing to sit. Pt then able to come to stand with R HHA and pivot to recliner with min A throughout to steady. Once in recliner pt with increased pain, crying, and unable to find comfortable positioning and with noted increased anxiousness needing significantly increased time to calm. Encouraged pursed lip breathing and repositioned LLE elevated and back of chair reclined. Alerted RN and RN arriving to give pain meds with pt able to calm at end of session. Current plan remains appropriate to address deficits and maximize functional independence and decrease caregiver burden. Pt continues to benefit from skilled PT services to progress toward functional mobility goals.      If plan is discharge home, recommend the following: A lot of help with walking and/or transfers;A little help with bathing/dressing/bathroom   Can travel by private vehicle     No  Equipment Recommendations  Wheelchair (measurements PT);Wheelchair cushion (measurements PT)    Recommendations for Other Services       Precautions / Restrictions Precautions Precautions: Fall;Other (comment) Precaution Comments: anxious Restrictions Weight Bearing Restrictions: Yes LLE Weight Bearing: Non weight bearing     Mobility  Bed Mobility Overal bed mobility: Needs Assistance Bed Mobility: Supine to Sit     Supine to sit: Contact guard     General bed mobility comments: pt  able to come to sitting EOB witholut assist this session with less time needed to complete    Transfers Overall transfer level: Needs assistance Equipment used: 1 person hand held assist, Rolling walker (2 wheels) Transfers: Sit to/from Stand, Bed to chair/wheelchair/BSC Sit to Stand: Min assist Stand pivot transfers: Min assist         General transfer comment: Min A to stand from bed side with RW for support, pt unable to begin pivto to recliner as pt with increased pain with LLE in dependent position, pt able to return to sit and stand pivot without AD to L with this PTAs arm hooked under elbow per pt request    Ambulation/Gait Ambulation/Gait assistance:  (did not assess, increased pain noted with transfer.)             General Gait Details: unable   Stairs             Wheelchair Mobility     Tilt Bed    Modified Rankin (Stroke Patients Only)       Balance Overall balance assessment: Needs assistance Sitting-balance support: No upper extremity supported, Feet unsupported Sitting balance-Leahy Scale: Fair     Standing balance support: Bilateral upper extremity supported, During functional activity Standing balance-Leahy Scale: Poor Standing balance comment: reliant on external support to maintain standing balance                            Cognition Arousal: Alert Behavior During Therapy: WFL for tasks assessed/performed, Anxious Overall Cognitive Status: Within Functional Limits for tasks assessed  General Comments: pleasant, appropriate. still anxious with mobility and pain anticipation encouraged pursed lip breathing throughout mobility        Exercises Other Exercises Other Exercises: pt unable to tolerate further exercise post trasnfer due to pain    General Comments General comments (skin integrity, edema, etc.): VSS on RA, pt with significantly increased anxitey toward pain this  session, crying and writhing once up in chair, encouraged breathing techniques with no resolution, RN giving pain meds during session with pt able to calm with significantly increased time and repositioning      Pertinent Vitals/Pain Pain Assessment Pain Assessment: Faces Faces Pain Scale: Hurts worst Pain Location: L LE at end of session Pain Descriptors / Indicators: Grimacing, Guarding Pain Intervention(s): Repositioned, Monitored during session, Limited activity within patient's tolerance, Patient requesting pain meds-RN notified, RN gave pain meds during session    Home Living                          Prior Function            PT Goals (current goals can now be found in the care plan section) Acute Rehab PT Goals Patient Stated Goal: be able to go to [place] PT Goal Formulation: With patient/family Time For Goal Achievement: 06/19/23 Progress towards PT goals: Not progressing toward goals - comment (pain)    Frequency    Min 1X/week      PT Plan Current plan remains appropriate    Co-evaluation              AM-PAC PT "6 Clicks" Mobility   Outcome Measure  Help needed turning from your back to your side while in a flat bed without using bedrails?: A Little Help needed moving from lying on your back to sitting on the side of a flat bed without using bedrails?: A Little Help needed moving to and from a bed to a chair (including a wheelchair)?: A Little Help needed standing up from a chair using your arms (e.g., wheelchair or bedside chair)?: A Little Help needed to walk in hospital room?: Total Help needed climbing 3-5 steps with a railing? : Total 6 Click Score: 14    End of Session Equipment Utilized During Treatment: Gait belt Activity Tolerance: Patient limited by pain Patient left: in chair;with call bell/phone within reach;with nursing/sitter in room Nurse Communication: Mobility status;Other (comment) (RN giving meds during session) PT  Visit Diagnosis: Pain;Other abnormalities of gait and mobility (R26.89) Pain - Right/Left: Left Pain - part of body: Leg     Time: 1610-9604 PT Time Calculation (min) (ACUTE ONLY): 34 min  Charges:    $Therapeutic Activity: 23-37 mins PT General Charges $$ ACUTE PT VISIT: 1 Visit                     Kaisy Severino R. PTA Acute Rehabilitation Services Office: [phone]   Catalina Antigua 06/11/2023, 4:41 PM

## 2023-06-11 NOTE — Progress Notes (Signed)
Patient is waiting to be discharged to SNF once bed is available.  Patient seen and examined at bedside today and plan of care discussed with her.  Still complains of intermittent pain.  She is currently medically stable for discharge.  Please refer to the discharge summary done by Dr. Stephania Fragmin on 06/10/2023 for full details.

## 2023-06-12 MED ORDER — ACETAMINOPHEN 500 MG PO TABS
1000.0000 mg | ORAL_TABLET | Freq: Four times a day (QID) | ORAL | Status: DC | PRN
  Administered 2023-06-12 – 2023-06-13 (×2): 1000 mg via ORAL
  Filled 2023-06-12 (×2): qty 2

## 2023-06-12 MED ORDER — MAGIC MOUTHWASH
5.0000 mL | Freq: Three times a day (TID) | ORAL | Status: DC
  Administered 2023-06-12 – 2023-06-13 (×3): 5 mL via ORAL
  Filled 2023-06-12 (×4): qty 5

## 2023-06-12 MED ORDER — TIZANIDINE HCL 2 MG PO TABS
2.0000 mg | ORAL_TABLET | Freq: Every day | ORAL | Status: DC | PRN
  Administered 2023-06-13: 2 mg via ORAL
  Filled 2023-06-12 (×2): qty 1

## 2023-06-12 NOTE — Progress Notes (Signed)
Occupational Therapy Treatment Patient Details Name: Haley White MRN: [id number] DOB: 01/03/1956 Today's Date: 06/12/2023   History of present illness 67 y/o female adm 7/29 after fall with Lt trimalleolar ankle fracture. 7/31 ORIF. NWB. No significant PMhx   OT comments  Pt remains eager to participate. Some decreased pain noted with premedication 1 hr prior to OT session. Focused on compensatory strategies for LB dressing w/ pt able to manage EOB/supine without physical assist. Pt hopeful to progress quickly with rehab at [facility] to regain independence.      If plan is discharge home, recommend the following:  A little help with walking and/or transfers;A little help with bathing/dressing/bathroom;Assistance with Charity fundraiser cushion (measurements OT);Wheelchair (measurements OT)    Recommendations for Other Services      Precautions / Restrictions Precautions Precautions: Fall Restrictions Weight Bearing Restrictions: Yes LLE Weight Bearing: Non weight bearing       Mobility Bed Mobility Overal bed mobility: Modified Independent Bed Mobility: Supine to Sit, Sit to Supine                Transfers                         Balance Overall balance assessment: Needs assistance Sitting-balance support: No upper extremity supported, Feet unsupported Sitting balance-Leahy Scale: Good                                     ADL either performed or assessed with clinical judgement   ADL Overall ADL's : Needs assistance/impaired                     Lower Body Dressing: Bed level;Contact guard assist Lower Body Dressing Details (indicate cue type and reason): use of scrub pants for practice. educated on compensatory strategies while NWB, donning affected LE first. pt able to manage LEs sitting EOB and return to supiine to bridge with RLE to don over waist. able to doff in same manner                General ADL Comments: unable to locate standard w/c with elevating footrests and removable armrest for practice prior to session. focus on LB strategies w/ good carryover    Extremity/Trunk Assessment Upper Extremity Assessment Upper Extremity Assessment: Overall WFL for tasks assessed   Lower Extremity Assessment Lower Extremity Assessment: Defer to PT evaluation        Vision   Vision Assessment?: No apparent visual deficits   Perception     Praxis      Cognition Arousal: Alert Behavior During Therapy: WFL for tasks assessed/performed, Anxious Overall Cognitive Status: Within Functional Limits for tasks assessed                                 General Comments: pleasant, appropriate. still anxious with mobility and pain anticipation        Exercises      Shoulder Instructions       General Comments      Pertinent Vitals/ Pain       Pain Assessment Pain Assessment: Faces Faces Pain Scale: Hurts little more Pain Location: LLE Pain Descriptors / Indicators: Guarding, Grimacing Pain Intervention(s): Premedicated before session, Monitored during session  Home Living  Prior Functioning/Environment              Frequency  Min 1X/week        Progress Toward Goals  OT Goals(current goals can now be found in the care plan section)  Progress towards OT goals: Progressing toward goals  Acute Rehab OT Goals Patient Stated Goal: regain independence, go on [place] trip OT Goal Formulation: With patient Time For Goal Achievement: 06/17/23 Potential to Achieve Goals: Good ADL Goals Pt Will Perform Lower Body Bathing: sit to/from stand;sitting/lateral leans;with min assist Pt Will Perform Lower Body Dressing: with min assist;sit to/from stand;sitting/lateral leans Pt Will Transfer to Toilet: with min assist;stand pivot transfer;bedside commode Pt Will Perform Toileting - Clothing  Manipulation and hygiene: with min assist;sit to/from stand;sitting/lateral leans  Plan Discharge plan remains appropriate    Co-evaluation                 AM-PAC OT "6 Clicks" Daily Activity     Outcome Measure   Help from another person eating meals?: None Help from another person taking care of personal grooming?: A Little Help from another person toileting, which includes using toliet, bedpan, or urinal?: A Lot Help from another person bathing (including washing, rinsing, drying)?: A Lot Help from another person to put on and taking off regular upper body clothing?: A Little Help from another person to put on and taking off regular lower body clothing?: A Little 6 Click Score: 17    End of Session    OT Visit Diagnosis: Unsteadiness on feet (R26.81);Other abnormalities of gait and mobility (R26.89);Muscle weakness (generalized) (M62.81)   Activity Tolerance Patient tolerated treatment well   Patient Left in bed;with call bell/phone within reach;with bed alarm set   Nurse Communication          Time: 4098-1191 OT Time Calculation (min): 21 min  Charges: OT General Charges $OT Visit: 1 Visit OT Treatments $Self Care/Home Management : 8-22 mins  Bradd Canary, OTR/L Acute Rehab Services Office: [phone]   Lorre Munroe 06/12/2023, 7:50 AM

## 2023-06-12 NOTE — Plan of Care (Signed)

## 2023-06-12 NOTE — Progress Notes (Signed)
Patient is still waiting to be discharged to SNF once bed is available.  I have seen and examined the patient at bedside today and discussed the plan of care with her.  She still complains of intermittent lower extremity pain.  She is currently stable for discharge medically to SNF.  Please refer to the full discharge summary done by Dr. Stephania Fragmin on 06/10/2023 for full details.

## 2023-06-12 NOTE — Progress Notes (Signed)
TRH night cross cover note:   I was notified by RN that the patient has white patches on her tongue as well as her tonsils, noting that these lesions have been present for approximately a week, with difficulty swallowing over that timeframe.  The patient conveys a history of oral thrush and has previously used Magic mouthwash, noting its effectiveness.   I subsequently placed order for nystatin swish and swallow.    Newton Pigg, DO Hospitalist

## 2023-06-12 NOTE — Progress Notes (Addendum)
TRH night cross cover note:   I was notified by RN that this patient is nearing discharge and that her current pain regimen includes prn oxycodone as well as as needed IV morphine.  In preparation for discharge on oral medications will discontinue existing order for as needed IV morphine, and add acetaminophen 1 g p.o. every 6 hours as needed for breakthrough pain.  Additionally, patient has requested resumption of her home Zanaflex 2 mg p.o. daily as needed for muscle spasms.  I subsequently resumed this home prn Zanaflex.    Newton Pigg, DO Hospitalist

## 2023-06-12 NOTE — TOC Progression Note (Signed)
Transition of Care [facility]) - Progression Note    Patient Details  Name: Haley White MRN: [id number] Date of Birth: 05-Apr-1956  [facility]) CM/SW Contact  Kimla Furth A [place], [place] Phone Number: 06/12/2023, 8:49 AM  Clinical Narrative:     CSW was notified by [facility] administration that pt's Berkley Harvey is still pending.   TOC will continue to follow.   Expected Discharge Plan: Home w Home Health Services Barriers to Discharge: Continued Medical Work up  Expected Discharge Plan and Services   Discharge Planning Services: CM Consult   Living arrangements for the past 2 months: Single Family Home Expected Discharge Date: 06/11/23                                     Social Determinants of Health (SDOH) Interventions SDOH Screenings   Food Insecurity: Patient Declined (06/03/2023)  Housing: Low Risk  (06/02/2023)  Transportation Needs: No Transportation Needs (06/02/2023)  Utilities: Not At Risk (06/03/2023)  Tobacco Use: Low Risk  (06/04/2023)    Readmission Risk Interventions     No data to display

## 2023-06-13 DIAGNOSIS — S82852D Displaced trimalleolar fracture of left lower leg, subsequent encounter for closed fracture with routine healing: Secondary | ICD-10-CM | POA: Diagnosis not present

## 2023-06-13 DIAGNOSIS — M542 Cervicalgia: Secondary | ICD-10-CM | POA: Diagnosis not present

## 2023-06-13 DIAGNOSIS — J449 Chronic obstructive pulmonary disease, unspecified: Secondary | ICD-10-CM | POA: Diagnosis not present

## 2023-06-13 DIAGNOSIS — F411 Generalized anxiety disorder: Secondary | ICD-10-CM | POA: Diagnosis not present

## 2023-06-13 DIAGNOSIS — I10 Essential (primary) hypertension: Secondary | ICD-10-CM | POA: Diagnosis not present

## 2023-06-13 DIAGNOSIS — R2689 Other abnormalities of gait and mobility: Secondary | ICD-10-CM | POA: Diagnosis not present

## 2023-06-13 DIAGNOSIS — F4320 Adjustment disorder, unspecified: Secondary | ICD-10-CM | POA: Diagnosis not present

## 2023-06-13 DIAGNOSIS — Z9181 History of falling: Secondary | ICD-10-CM | POA: Diagnosis not present

## 2023-06-13 DIAGNOSIS — R1313 Dysphagia, pharyngeal phase: Secondary | ICD-10-CM | POA: Diagnosis not present

## 2023-06-13 DIAGNOSIS — Z4789 Encounter for other orthopedic aftercare: Secondary | ICD-10-CM | POA: Diagnosis not present

## 2023-06-13 DIAGNOSIS — M79645 Pain in left finger(s): Secondary | ICD-10-CM | POA: Diagnosis not present

## 2023-06-13 DIAGNOSIS — F413 Other mixed anxiety disorders: Secondary | ICD-10-CM | POA: Diagnosis not present

## 2023-06-13 DIAGNOSIS — M6281 Muscle weakness (generalized): Secondary | ICD-10-CM | POA: Diagnosis not present

## 2023-06-13 DIAGNOSIS — Z7401 Bed confinement status: Secondary | ICD-10-CM | POA: Diagnosis not present

## 2023-06-13 DIAGNOSIS — F321 Major depressive disorder, single episode, moderate: Secondary | ICD-10-CM | POA: Diagnosis not present

## 2023-06-13 DIAGNOSIS — Z7189 Other specified counseling: Secondary | ICD-10-CM | POA: Diagnosis not present

## 2023-06-13 DIAGNOSIS — J45909 Unspecified asthma, uncomplicated: Secondary | ICD-10-CM | POA: Diagnosis not present

## 2023-06-13 DIAGNOSIS — Z743 Need for continuous supervision: Secondary | ICD-10-CM | POA: Diagnosis not present

## 2023-06-13 DIAGNOSIS — F32A Depression, unspecified: Secondary | ICD-10-CM | POA: Diagnosis not present

## 2023-06-13 DIAGNOSIS — I469 Cardiac arrest, cause unspecified: Secondary | ICD-10-CM | POA: Diagnosis not present

## 2023-06-13 DIAGNOSIS — M84372S Stress fracture, left ankle, sequela: Secondary | ICD-10-CM | POA: Diagnosis not present

## 2023-06-13 DIAGNOSIS — S82852S Displaced trimalleolar fracture of left lower leg, sequela: Secondary | ICD-10-CM | POA: Diagnosis not present

## 2023-06-13 DIAGNOSIS — F331 Major depressive disorder, recurrent, moderate: Secondary | ICD-10-CM | POA: Diagnosis not present

## 2023-06-13 DIAGNOSIS — K219 Gastro-esophageal reflux disease without esophagitis: Secondary | ICD-10-CM | POA: Diagnosis not present

## 2023-06-13 DIAGNOSIS — E785 Hyperlipidemia, unspecified: Secondary | ICD-10-CM | POA: Diagnosis not present

## 2023-06-13 DIAGNOSIS — G4733 Obstructive sleep apnea (adult) (pediatric): Secondary | ICD-10-CM | POA: Diagnosis not present

## 2023-06-13 DIAGNOSIS — R278 Other lack of coordination: Secondary | ICD-10-CM | POA: Diagnosis not present

## 2023-06-13 DIAGNOSIS — F988 Other specified behavioral and emotional disorders with onset usually occurring in childhood and adolescence: Secondary | ICD-10-CM | POA: Diagnosis not present

## 2023-06-13 NOTE — Progress Notes (Signed)
Messaged the MD about patient pain. She was crying in 10/10 pain after I gave her tylenol and robaxin. I let her know that oxycodone was not available at the moment, she had to wait at least 30 more min. The daughter and patient asked to speak with the surgeon to make sure she does not have an infection. The hospitalist MD told me to reach out to surgery for her post op pain. I sent a secure chat to the MD on the last surgery note letting him know what is going on.

## 2023-06-13 NOTE — Progress Notes (Signed)
Called to give report to facility for patient transfer. Someone took my number to give me a call back

## 2023-06-13 NOTE — Progress Notes (Signed)
Physical Therapy Treatment Patient Details Name: Haley White MRN: [id number] DOB: 1955-11-07 Today's Date: 06/13/2023   History of Present Illness 67 y/o female adm 7/29 after fall with Lt trimalleolar ankle fracture. 7/31 ORIF. NWB. No significant PMhx    PT Comments  Pt greeted resting in bed and agreeable to session, however declining EOB/OOB mobility due to pain, despite coordination with RN and premedication 1 hour pre-session. Pt able to complete LE therex with good tolerance with cues for technique and light hands on assist for gravity minimization and increased ROM. HEP provided at end of session with pt verbalizing understanding of all exercises, importance of compliance and frequency. Pt positioned to comfort with bed in partial chair position at end of session to increase tolerance of LLE in dependent positioning with pt verbalizing understanding of importance and reasoning. Current plan remains appropriate to address deficits and maximize functional independence and decrease caregiver burden. Pt continues to benefit from skilled PT services to progress toward functional mobility goals.      If plan is discharge home, recommend the following: A lot of help with walking and/or transfers;A little help with bathing/dressing/bathroom   Can travel by private vehicle     No  Equipment Recommendations  Wheelchair (measurements PT);Wheelchair cushion (measurements PT)    Recommendations for Other Services       Precautions / Restrictions Precautions Precautions: Fall Precaution Comments: anxious Restrictions Weight Bearing Restrictions: Yes LLE Weight Bearing: Non weight bearing     Mobility  Bed Mobility Overal bed mobility: Modified Independent             General bed mobility comments: pt declining EOB/OOB this session, moved pt to chair positioin in bed for increased tolerance of LLE in dependent positioing    Transfers                         Ambulation/Gait                   Stairs             Wheelchair Mobility     Tilt Bed    Modified Rankin (Stroke Patients Only)       Balance Overall balance assessment: Needs assistance Sitting-balance support: No upper extremity supported, Feet unsupported Sitting balance-Leahy Scale: Good     Standing balance support: Bilateral upper extremity supported, During functional activity Standing balance-Leahy Scale: Poor Standing balance comment: reliant on external support to maintain standing balance                            Cognition Arousal: Alert Behavior During Therapy: WFL for tasks assessed/performed, Anxious Overall Cognitive Status: Within Functional Limits for tasks assessed                                 General Comments: pleasant, appropriate. still anxious with mobility and pain anticipation        Exercises General Exercises - Lower Extremity Quad Sets: AROM, Left, 10 reps, Seated (x 2 sets) Short Arc Quad: AROM, Left, 10 reps, Seated (x 2 sets (bed in partial chair position)) Heel Slides: AROM, Left, 10 reps, Seated (x 2 sets) Hip ABduction/ADduction: AROM, Left, 10 reps, Seated (x 2 sets) Straight Leg Raises: AROM, Left, 10 reps, Supine (x2 sets) Other Exercises Other Exercises: supine bridge; 10reps x2 sets  General Comments General comments (skin integrity, edema, etc.): VSS on RA, moved bed to paritial chair position to increased LLE tolerance in dependent positioning and educated pt on reasoning with pt verbalizing understanding, pt verbalizing understanding of remote to raise LEs if pain becomes too high, HEP issued to pt for bed level exercises      Pertinent Vitals/Pain Pain Assessment Pain Assessment: Faces Faces Pain Scale: Hurts little more Pain Descriptors / Indicators: Guarding, Grimacing Pain Intervention(s): Premedicated before session, Limited activity within patient's tolerance,  Monitored during session, Repositioned    Home Living                          Prior Function            PT Goals (current goals can now be found in the care plan section) Acute Rehab PT Goals Patient Stated Goal: for the pain to go away PT Goal Formulation: With patient/family Time For Goal Achievement: 06/19/23 Progress towards PT goals: Progressing toward goals    Frequency    Min 1X/week      PT Plan Current plan remains appropriate    Co-evaluation              AM-PAC PT "6 Clicks" Mobility   Outcome Measure  Help needed turning from your back to your side while in a flat bed without using bedrails?: A Little Help needed moving from lying on your back to sitting on the side of a flat bed without using bedrails?: A Little Help needed moving to and from a bed to a chair (including a wheelchair)?: A Little Help needed standing up from a chair using your arms (e.g., wheelchair or bedside chair)?: A Little Help needed to walk in hospital room?: Total Help needed climbing 3-5 steps with a railing? : Total 6 Click Score: 14    End of Session Equipment Utilized During Treatment: Gait belt Activity Tolerance: Patient limited by pain Patient left: with call bell/phone within reach;in bed;Other (comment) (bed in chair position) Nurse Communication: Mobility status PT Visit Diagnosis: Pain;Other abnormalities of gait and mobility (R26.89) Pain - Right/Left: Left Pain - part of body: Leg     Time: 8657-8469 PT Time Calculation (min) (ACUTE ONLY): 27 min  Charges:    $Therapeutic Exercise: 23-37 mins PT General Charges $$ ACUTE PT VISIT: 1 Visit                     Kate Sweetman R. PTA Acute Rehabilitation Services Office: [phone]   Catalina Antigua 06/13/2023, 10:42 AM

## 2023-06-13 NOTE — TOC Progression Note (Signed)
Transition of Care [facility]) - Progression Note    Patient Details  Name: Haley White MRN: [id number] Date of Birth: August 30, 1956  Transition of Care [facility]) CM/SW Contact  Kairee Isa A [place], [place] Phone Number: 06/13/2023, 8:41 AM  Clinical Narrative:     Pt's Berkley Harvey is pending due to needing medical review.   CSW notified provider and facility of discharge pending insurance authorization.    TOC will continue to follow.  Expected Discharge Plan: Home w Home Health Services Barriers to Discharge: Continued Medical Work up  Expected Discharge Plan and Services   Discharge Planning Services: CM Consult   Living arrangements for the past 2 months: Single Family Home Expected Discharge Date: 06/11/23                                     Social Determinants of Health (SDOH) Interventions SDOH Screenings   Food Insecurity: Patient Declined (06/03/2023)  Housing: Low Risk  (06/02/2023)  Transportation Needs: No Transportation Needs (06/02/2023)  Utilities: Not At Risk (06/03/2023)  Tobacco Use: Low Risk  (06/04/2023)    Readmission Risk Interventions     No data to display

## 2023-06-13 NOTE — TOC Transition Note (Signed)
Transition of Care [facility]) - CM/SW Discharge Note   Patient Details  Name: Haley White MRN: [id number] Date of Birth: 11-12-1955  Transition of Care [facility]) CM/SW Contact:  Izela Altier A [place], Theresia Majors Phone Number: 06/13/2023, 5:05 PM   Clinical Narrative:     Patient will DC to: [facility]   Anticipated DC date: 06/13/23  Family notified: Shane Crutch  Transport by: Sharin Mons      Per MD patient ready for DC to [facility] . RN, patient, patient's family, and facility notified of DC. Discharge Summary and FL2 sent to facility. RN to call report prior to discharge ( [phone], Room 302A). DC packet on chart. Ambulance transport requested for patient.     CSW will sign off for now as social work intervention is no longer needed. Please consult [place] again if new needs arise.   Final next level of care: Skilled Nursing Facility Barriers to Discharge: Barriers Resolved   Patient Goals and CMS Choice CMS Medicare.gov Compare Post Acute Care list provided to:: Patient Choice offered to / list presented to : Patient  Discharge Placement                Patient chooses bed at: [facility] Patient to be transferred to facility by: [facility] Name of family member notified: Shane Crutch Patient and family notified of of transfer: 06/13/23  Discharge Plan and Services Additional resources added to the After Visit Summary for     Discharge Planning Services: CM Consult                                 Social Determinants of Health (SDOH) Interventions SDOH Screenings   Food Insecurity: Patient Declined (06/03/2023)  Housing: Low Risk  (06/02/2023)  Transportation Needs: No Transportation Needs (06/02/2023)  Utilities: Not At Risk (06/03/2023)  Tobacco Use: Low Risk  (06/04/2023)     Readmission Risk Interventions     No data to display

## 2023-06-13 NOTE — Progress Notes (Addendum)
Patient still waiting to be discharged to SNF once bed is available.  She is currently medically stable for discharge.  Please refer to the full discharge summary done by Dr. Stephania Fragmin on 06/10/2023 for full details.  Patient seen and examined at bedside.

## 2023-06-13 NOTE — Plan of Care (Signed)
  Problem: Safety: Goal: Ability to remain free from injury will improve Outcome: Not Progressing   Problem: Pain Managment: Goal: General experience of comfort will improve Outcome: Not Progressing   Problem: Elimination: Goal: Will not experience complications related to bowel motility Outcome: Not Progressing

## 2023-06-15 ENCOUNTER — Encounter (HOSPITAL_BASED_OUTPATIENT_CLINIC_OR_DEPARTMENT_OTHER): Payer: Self-pay | Admitting: Physical Therapy

## 2023-06-16 ENCOUNTER — Encounter: Payer: Self-pay | Admitting: Nurse Practitioner

## 2023-06-16 DIAGNOSIS — J449 Chronic obstructive pulmonary disease, unspecified: Secondary | ICD-10-CM | POA: Diagnosis not present

## 2023-06-16 DIAGNOSIS — E785 Hyperlipidemia, unspecified: Secondary | ICD-10-CM | POA: Diagnosis not present

## 2023-06-16 DIAGNOSIS — I10 Essential (primary) hypertension: Secondary | ICD-10-CM | POA: Diagnosis not present

## 2023-06-16 DIAGNOSIS — S82852S Displaced trimalleolar fracture of left lower leg, sequela: Secondary | ICD-10-CM | POA: Diagnosis not present

## 2023-06-17 DIAGNOSIS — F411 Generalized anxiety disorder: Secondary | ICD-10-CM | POA: Diagnosis not present

## 2023-06-17 DIAGNOSIS — M84372S Stress fracture, left ankle, sequela: Secondary | ICD-10-CM | POA: Diagnosis not present

## 2023-06-17 DIAGNOSIS — Z9181 History of falling: Secondary | ICD-10-CM | POA: Diagnosis not present

## 2023-06-17 DIAGNOSIS — J449 Chronic obstructive pulmonary disease, unspecified: Secondary | ICD-10-CM | POA: Diagnosis not present

## 2023-06-17 DIAGNOSIS — M6281 Muscle weakness (generalized): Secondary | ICD-10-CM | POA: Diagnosis not present

## 2023-06-17 DIAGNOSIS — F321 Major depressive disorder, single episode, moderate: Secondary | ICD-10-CM | POA: Diagnosis not present

## 2023-06-17 DIAGNOSIS — Z4789 Encounter for other orthopedic aftercare: Secondary | ICD-10-CM | POA: Diagnosis not present

## 2023-06-17 DIAGNOSIS — R278 Other lack of coordination: Secondary | ICD-10-CM | POA: Diagnosis not present

## 2023-06-17 DIAGNOSIS — S82852D Displaced trimalleolar fracture of left lower leg, subsequent encounter for closed fracture with routine healing: Secondary | ICD-10-CM | POA: Diagnosis not present

## 2023-06-19 DIAGNOSIS — S82852S Displaced trimalleolar fracture of left lower leg, sequela: Secondary | ICD-10-CM | POA: Diagnosis not present

## 2023-06-19 DIAGNOSIS — R2689 Other abnormalities of gait and mobility: Secondary | ICD-10-CM | POA: Diagnosis not present

## 2023-06-19 DIAGNOSIS — F988 Other specified behavioral and emotional disorders with onset usually occurring in childhood and adolescence: Secondary | ICD-10-CM | POA: Diagnosis not present

## 2023-06-19 DIAGNOSIS — F4320 Adjustment disorder, unspecified: Secondary | ICD-10-CM | POA: Diagnosis not present

## 2023-06-19 DIAGNOSIS — J45909 Unspecified asthma, uncomplicated: Secondary | ICD-10-CM | POA: Diagnosis not present

## 2023-06-19 DIAGNOSIS — Z4789 Encounter for other orthopedic aftercare: Secondary | ICD-10-CM | POA: Diagnosis not present

## 2023-06-19 DIAGNOSIS — F413 Other mixed anxiety disorders: Secondary | ICD-10-CM | POA: Diagnosis not present

## 2023-06-19 DIAGNOSIS — F32A Depression, unspecified: Secondary | ICD-10-CM | POA: Diagnosis not present

## 2023-06-20 ENCOUNTER — Other Ambulatory Visit: Payer: Self-pay | Admitting: Internal Medicine

## 2023-06-20 DIAGNOSIS — J449 Chronic obstructive pulmonary disease, unspecified: Secondary | ICD-10-CM | POA: Diagnosis not present

## 2023-06-20 DIAGNOSIS — S82852D Displaced trimalleolar fracture of left lower leg, subsequent encounter for closed fracture with routine healing: Secondary | ICD-10-CM | POA: Diagnosis not present

## 2023-06-20 DIAGNOSIS — Z9181 History of falling: Secondary | ICD-10-CM | POA: Diagnosis not present

## 2023-06-20 DIAGNOSIS — Z4789 Encounter for other orthopedic aftercare: Secondary | ICD-10-CM | POA: Diagnosis not present

## 2023-06-20 DIAGNOSIS — R278 Other lack of coordination: Secondary | ICD-10-CM | POA: Diagnosis not present

## 2023-06-20 DIAGNOSIS — M6281 Muscle weakness (generalized): Secondary | ICD-10-CM | POA: Diagnosis not present

## 2023-06-21 DIAGNOSIS — F331 Major depressive disorder, recurrent, moderate: Secondary | ICD-10-CM | POA: Diagnosis not present

## 2023-06-23 ENCOUNTER — Encounter: Payer: Self-pay | Admitting: Podiatry

## 2023-06-24 DIAGNOSIS — Z7189 Other specified counseling: Secondary | ICD-10-CM | POA: Diagnosis not present

## 2023-06-25 DIAGNOSIS — M6281 Muscle weakness (generalized): Secondary | ICD-10-CM | POA: Diagnosis not present

## 2023-06-25 DIAGNOSIS — J449 Chronic obstructive pulmonary disease, unspecified: Secondary | ICD-10-CM | POA: Diagnosis not present

## 2023-06-25 DIAGNOSIS — R278 Other lack of coordination: Secondary | ICD-10-CM | POA: Diagnosis not present

## 2023-06-25 DIAGNOSIS — Z9181 History of falling: Secondary | ICD-10-CM | POA: Diagnosis not present

## 2023-06-25 DIAGNOSIS — Z4789 Encounter for other orthopedic aftercare: Secondary | ICD-10-CM | POA: Diagnosis not present

## 2023-06-25 DIAGNOSIS — S82852D Displaced trimalleolar fracture of left lower leg, subsequent encounter for closed fracture with routine healing: Secondary | ICD-10-CM | POA: Diagnosis not present

## 2023-06-26 DIAGNOSIS — I10 Essential (primary) hypertension: Secondary | ICD-10-CM | POA: Diagnosis not present

## 2023-06-26 DIAGNOSIS — S82852S Displaced trimalleolar fracture of left lower leg, sequela: Secondary | ICD-10-CM | POA: Diagnosis not present

## 2023-06-30 DIAGNOSIS — I10 Essential (primary) hypertension: Secondary | ICD-10-CM | POA: Diagnosis not present

## 2023-06-30 DIAGNOSIS — R2689 Other abnormalities of gait and mobility: Secondary | ICD-10-CM | POA: Diagnosis not present

## 2023-06-30 DIAGNOSIS — S82852S Displaced trimalleolar fracture of left lower leg, sequela: Secondary | ICD-10-CM | POA: Diagnosis not present

## 2023-06-30 DIAGNOSIS — E785 Hyperlipidemia, unspecified: Secondary | ICD-10-CM | POA: Diagnosis not present

## 2023-07-01 DIAGNOSIS — Z4789 Encounter for other orthopedic aftercare: Secondary | ICD-10-CM | POA: Diagnosis not present

## 2023-07-02 DIAGNOSIS — Z4789 Encounter for other orthopedic aftercare: Secondary | ICD-10-CM | POA: Diagnosis not present

## 2023-07-02 DIAGNOSIS — J449 Chronic obstructive pulmonary disease, unspecified: Secondary | ICD-10-CM | POA: Diagnosis not present

## 2023-07-02 DIAGNOSIS — S82852D Displaced trimalleolar fracture of left lower leg, subsequent encounter for closed fracture with routine healing: Secondary | ICD-10-CM | POA: Diagnosis not present

## 2023-07-02 DIAGNOSIS — M6281 Muscle weakness (generalized): Secondary | ICD-10-CM | POA: Diagnosis not present

## 2023-07-02 DIAGNOSIS — Z9181 History of falling: Secondary | ICD-10-CM | POA: Diagnosis not present

## 2023-07-02 DIAGNOSIS — R278 Other lack of coordination: Secondary | ICD-10-CM | POA: Diagnosis not present

## 2023-07-04 DIAGNOSIS — M542 Cervicalgia: Secondary | ICD-10-CM | POA: Diagnosis not present

## 2023-07-04 DIAGNOSIS — M79645 Pain in left finger(s): Secondary | ICD-10-CM | POA: Diagnosis not present

## 2023-07-08 DIAGNOSIS — E785 Hyperlipidemia, unspecified: Secondary | ICD-10-CM | POA: Diagnosis not present

## 2023-07-08 DIAGNOSIS — W19XXXD Unspecified fall, subsequent encounter: Secondary | ICD-10-CM | POA: Diagnosis not present

## 2023-07-08 DIAGNOSIS — M47816 Spondylosis without myelopathy or radiculopathy, lumbar region: Secondary | ICD-10-CM | POA: Diagnosis not present

## 2023-07-08 DIAGNOSIS — M6281 Muscle weakness (generalized): Secondary | ICD-10-CM | POA: Diagnosis not present

## 2023-07-08 DIAGNOSIS — S8292XD Unspecified fracture of left lower leg, subsequent encounter for closed fracture with routine healing: Secondary | ICD-10-CM | POA: Diagnosis not present

## 2023-07-08 DIAGNOSIS — K219 Gastro-esophageal reflux disease without esophagitis: Secondary | ICD-10-CM | POA: Diagnosis not present

## 2023-07-08 DIAGNOSIS — J449 Chronic obstructive pulmonary disease, unspecified: Secondary | ICD-10-CM | POA: Diagnosis not present

## 2023-07-08 DIAGNOSIS — S82852D Displaced trimalleolar fracture of left lower leg, subsequent encounter for closed fracture with routine healing: Secondary | ICD-10-CM | POA: Diagnosis not present

## 2023-07-08 DIAGNOSIS — Z79891 Long term (current) use of opiate analgesic: Secondary | ICD-10-CM | POA: Diagnosis not present

## 2023-07-08 DIAGNOSIS — F419 Anxiety disorder, unspecified: Secondary | ICD-10-CM | POA: Diagnosis not present

## 2023-07-08 DIAGNOSIS — M792 Neuralgia and neuritis, unspecified: Secondary | ICD-10-CM | POA: Diagnosis not present

## 2023-07-08 DIAGNOSIS — I10 Essential (primary) hypertension: Secondary | ICD-10-CM | POA: Diagnosis not present

## 2023-07-08 DIAGNOSIS — Z9181 History of falling: Secondary | ICD-10-CM | POA: Diagnosis not present

## 2023-07-08 DIAGNOSIS — F32A Depression, unspecified: Secondary | ICD-10-CM | POA: Diagnosis not present

## 2023-07-10 ENCOUNTER — Encounter (HOSPITAL_COMMUNITY): Payer: Self-pay | Admitting: Emergency Medicine

## 2023-07-10 ENCOUNTER — Other Ambulatory Visit: Payer: Self-pay | Admitting: Internal Medicine

## 2023-07-10 ENCOUNTER — Other Ambulatory Visit: Payer: Self-pay | Admitting: Nurse Practitioner

## 2023-07-10 ENCOUNTER — Telehealth: Payer: Self-pay | Admitting: Nurse Practitioner

## 2023-07-10 ENCOUNTER — Emergency Department (HOSPITAL_COMMUNITY)
Admission: EM | Admit: 2023-07-10 | Discharge: 2023-07-10 | Disposition: A | Discharge status: Home / Self Care | Payer: Medicare HMO | Attending: Emergency Medicine | Admitting: Emergency Medicine

## 2023-07-10 ENCOUNTER — Other Ambulatory Visit: Payer: Self-pay

## 2023-07-10 ENCOUNTER — Encounter: Payer: Self-pay | Admitting: Nurse Practitioner

## 2023-07-10 DIAGNOSIS — H1131 Conjunctival hemorrhage, right eye: Secondary | ICD-10-CM | POA: Diagnosis not present

## 2023-07-10 DIAGNOSIS — K219 Gastro-esophageal reflux disease without esophagitis: Secondary | ICD-10-CM

## 2023-07-10 DIAGNOSIS — H109 Unspecified conjunctivitis: Secondary | ICD-10-CM | POA: Diagnosis not present

## 2023-07-10 DIAGNOSIS — H5710 Ocular pain, unspecified eye: Secondary | ICD-10-CM | POA: Diagnosis not present

## 2023-07-10 DIAGNOSIS — H1033 Unspecified acute conjunctivitis, bilateral: Secondary | ICD-10-CM | POA: Diagnosis not present

## 2023-07-10 DIAGNOSIS — Z79899 Other long term (current) drug therapy: Secondary | ICD-10-CM | POA: Insufficient documentation

## 2023-07-10 DIAGNOSIS — H1133 Conjunctival hemorrhage, bilateral: Secondary | ICD-10-CM | POA: Diagnosis not present

## 2023-07-10 DIAGNOSIS — I10 Essential (primary) hypertension: Secondary | ICD-10-CM | POA: Diagnosis not present

## 2023-07-10 DIAGNOSIS — B9689 Other specified bacterial agents as the cause of diseases classified elsewhere: Secondary | ICD-10-CM

## 2023-07-10 MED ORDER — TETRACAINE HCL 0.5 % OP SOLN
1.0000 [drp] | Freq: Once | OPHTHALMIC | Status: AC
  Administered 2023-07-10: 1 [drp] via OPHTHALMIC
  Filled 2023-07-10: qty 4

## 2023-07-10 MED ORDER — FLUORESCEIN SODIUM 1 MG OP STRP
1.0000 | ORAL_STRIP | Freq: Once | OPHTHALMIC | Status: AC
  Administered 2023-07-10: 1 via OPHTHALMIC
  Filled 2023-07-10: qty 1

## 2023-07-10 MED ORDER — BACITRACIN-POLYMYXIN B 500-10000 UNIT/GM OP OINT
TOPICAL_OINTMENT | Freq: Two times a day (BID) | OPHTHALMIC | 0 refills | Status: DC
Start: 2023-07-10 — End: 2023-07-18

## 2023-07-10 NOTE — Telephone Encounter (Signed)
Please call patient and get more information on her symptoms

## 2023-07-10 NOTE — Telephone Encounter (Signed)
Pt tested negative for covid 

## 2023-07-10 NOTE — Telephone Encounter (Signed)
Please ask patient to take a home covid test

## 2023-07-10 NOTE — Telephone Encounter (Signed)
I sent in an antibiotic ointment for her eyes

## 2023-07-10 NOTE — Telephone Encounter (Signed)
She has called and said she is congested and has a cough, advised Logan to tell her to take home covid test. I sent in an eye ointment to apply to each lower eyelid twice a day.  If covid is positive have her  notify the office

## 2023-07-10 NOTE — Discharge Instructions (Addendum)
You should increase your Polytrim to every 4 hours during the day.  I recommend you keep your head elevated   You should call your ophthalmologist for follow-up or follow-up with our ophthalmologist, Dr. Vanessa Barbara  Return to ER if you have worse eye pain or purulent drainage or fever

## 2023-07-10 NOTE — ED Notes (Signed)
ED Provider at bedside. 

## 2023-07-10 NOTE — ED Provider Notes (Signed)
[place] EMERGENCY DEPARTMENT AT [facility] Provider Note   CSN: [id number] Arrival date & time: 07/10/23  2106     History  Chief Complaint  Patient presents with   Eye Pain    Haley White is a 67 y.o. female history of left trimalar fracture, hypertension, here presenting with eye pain.  Patient noticed some eye redness this morning.  Patient was prescribed Polytrim ointment by PCP.  Patient states that this afternoon she has been rubbing her eye.  She states that this evening her eye became much more red and she has more pain.  Denies any blurry vision or double vision.  The history is provided by the patient.       Home Medications Prior to Admission medications   Medication Sig Start Date End Date Taking? Authorizing Provider  albuterol (VENTOLIN HFA) 108 (90 Base) MCG/ACT inhaler Inhale 2 puffs into the lungs every 4 (four) hours as needed for wheezing or shortness of breath. 07/17/17   Judd Gaudier, NP  albuterol (VENTOLIN HFA) 108 (90 Base) MCG/ACT inhaler Inhale 2 puffs into the lungs every 6 (six) hours as needed for wheezing or shortness of breath.    [provider]  bacitracin-polymyxin b (POLYSPORIN) ophthalmic ointment Place into the left eye 2 (two) times daily for 10 days. Place a 1/2 inch ribbon of ointment into the lower eyelid. 07/10/23 07/20/23  Raynelle Dick, NP  budesonide (PULMICORT) 0.5 MG/2ML nebulizer solution Take 0.5 mg by nebulization daily as needed.    [provider]  budesonide-formoterol (SYMBICORT) 160-4.5 MCG/ACT inhaler Inhale 2 puffs into the lungs daily as needed (wheezing).    [provider]  buPROPion (WELLBUTRIN XL) 300 MG 24 hr tablet Take 300 mg by mouth every morning. 12/17/20   [provider]  buPROPion (WELLBUTRIN XL) 300 MG 24 hr tablet Take 300 mg by mouth daily.    [provider]  cholecalciferol (VITAMIN D3) 25 MCG (1000 UNIT) tablet Take 1,000 Units by mouth daily.     [provider]  docusate sodium (COLACE) 100 MG capsule Take 1 capsule (100 mg total) by mouth 2 (two) times daily. 06/07/23   Amin, Ankit C, MD  EPINEPHrine 0.3 mg/0.3 mL IJ SOAJ injection See admin instructions. 02/08/20   [provider]  fexofenadine (ALLEGRA) 180 MG tablet Take 180 mg by mouth daily as needed for allergies.    [provider]  FLUoxetine (PROZAC) 20 MG capsule Take 60 mg by mouth daily. 02/23/23   [provider]  FLUoxetine (PROZAC) 20 MG capsule Take 60 mg by mouth daily.    [provider]  fluticasone (FLONASE) 50 MCG/ACT nasal spray Place 2 sprays into both nostrils daily. 10/17/17   [provider]  fluticasone (FLONASE) 50 MCG/ACT nasal spray Place 1 spray into both nostrils daily as needed for allergies or rhinitis.    [provider]  levocetirizine (XYZAL) 5 MG tablet Take 5 mg by mouth every evening. 12/07/17   [provider]  lisinopril (ZESTRIL) 20 MG tablet Take  1 tablet  Daily for BP                                                            /  TAKE                                         BY                                        MOUTH 11/22/22   Lucky Cowboy, MD  lisinopril (ZESTRIL) 20 MG tablet Take 20 mg by mouth daily.    [provider]  methocarbamol (ROBAXIN) 500 MG tablet Take 1 tablet (500 mg total) by mouth every 8 (eight) hours as needed for muscle spasms. 06/03/22   Lurline Idol, FNP  methocarbamol (ROBAXIN) 500 MG tablet Take 1 tablet (500 mg total) by mouth every 8 (eight) hours as needed for muscle spasms. 06/07/23   Amin, Ankit C, MD  methylphenidate (RITALIN LA) 10 MG 24 hr capsule Take 1 capsule (10 mg total) by mouth daily. 06/07/23   Amin, Ankit C, MD  methylphenidate (RITALIN) 10 MG tablet Take 10 mg by mouth 2 (two) times daily. 01/16/18   [provider]  montelukast (SINGULAIR) 10 MG tablet Take 10 mg by mouth daily.     [provider]  montelukast (SINGULAIR) 10 MG tablet Take 10 mg by mouth at bedtime.    [provider]  NON FORMULARY Inject 1 Dose into the skin once a week. Allergy shots from [facility] [place]. Patient not sure about the name of medicine    [provider]  oxyCODONE (OXY IR/ROXICODONE) 5 MG immediate release tablet Take 1-2 tablets (5-10 mg total) by mouth every 4 (four) hours as needed for moderate pain, severe pain or breakthrough pain. 06/07/23   Amin, Ankit C, MD  pantoprazole (PROTONIX) 40 MG tablet TAKE 1 TABLET BY MOUTH DAILY TO PREVENT INDIGESTION AND HEARTBURN 06/20/23   Raynelle Dick, NP  polyethylene glycol (MIRALAX / GLYCOLAX) 17 g packet Take 17 g by mouth 2 (two) times daily. 06/07/23   Amin, Ankit C, MD  rosuvastatin (CRESTOR) 40 MG tablet Take 40 mg by mouth daily. 02/20/23   [provider]  rosuvastatin (CRESTOR) 40 MG tablet Take 40 mg by mouth daily.    [provider]  SYMBICORT 160-4.5 MCG/ACT inhaler Inhale 2 puffs into the lungs 2 (two) times daily. 01/16/18   [provider]  traMADol (ULTRAM) 50 MG tablet Take 1 tablet (50 mg total) by mouth every 6 (six) hours as needed. 05/16/23   Raynelle Dick, NP  traMADol (ULTRAM) 50 MG tablet Take 1 tablet (50 mg total) by mouth every 8 (eight) hours as needed for moderate pain. 06/07/23   Amin, Ankit C, MD  triamcinolone cream (KENALOG) 0.1 % Apply 1 Application topically 2 (two) times daily. 04/14/23   Schutt, Edsel Petrin, PA-C  valACYclovir (VALTREX) 1000 MG tablet Take 1 tablet (1,000 mg total) by mouth 3 (three) times daily. Take for 7 days 05/16/23   Raynelle Dick, NP  VITAMIN D PO Take 10,000 Units by mouth daily.    [provider]      Allergies    Molds & smuts, Biaxin [clarithromycin], Biaxin [clarithromycin], Prednisone, Prednisone, Serevent [salmeterol], Tequin [gatifloxacin], Tequin [gatifloxacin], and Serevent [salmeterol]    Review of  Systems   Review of Systems  Eyes:  Positive for pain.  All other systems reviewed  and are negative.   Physical Exam Updated Vital Signs BP (!) 153/102 (BP Location: Right Arm)   Pulse (!) 104   Temp 99.4 F (37.4 C) (Oral)   Resp 20   SpO2 95%  Physical Exam Vitals and nursing note reviewed.  Constitutional:      Appearance: Normal appearance.  HENT:     Head: Normocephalic.     Nose: Nose normal.     Mouth/Throat:     Mouth: Mucous membranes are moist.  Eyes:     Comments: Bilateral conjunctival erythema.  Patient does have a right subconjuntival hemorrhage.  Patient's extraocular movement is intact.  I was able to use fluorescein stain and there is no uptake.  No obvious foreign body under the eyelid.  Eye pressure is 20 on the right eye  Cardiovascular:     Rate and Rhythm: Normal rate and regular rhythm.     Pulses: Normal pulses.     Heart sounds: Normal heart sounds.  Pulmonary:     Effort: Pulmonary effort is normal.     Breath sounds: Normal breath sounds.  Abdominal:     General: Abdomen is flat.     Palpations: Abdomen is soft.  Musculoskeletal:        General: Normal range of motion.     Cervical back: Normal range of motion and neck supple.  Skin:    General: Skin is warm.  Neurological:     General: No focal deficit present.     Mental Status: She is alert and oriented to person, place, and time.  Psychiatric:        Mood and Affect: Mood normal.        Behavior: Behavior normal.     ED Results / Procedures / Treatments   Labs (all labs ordered are listed, but only abnormal results are displayed) Labs Reviewed - No data to display  EKG None  Radiology No results found.  Procedures Procedures    Medications Ordered in ED Medications  tetracaine (PONTOCAINE) 0.5 % ophthalmic solution 1 drop (1 drop Right Eye Given 07/10/23 2211)  fluorescein ophthalmic strip 1 strip (1 strip Right Eye Given 07/10/23 2211)    ED Course/ Medical Decision  Making/ A&P                                 Medical Decision Making Haley White is a 67 y.o. female here presenting with subconjunctival hemorrhage and bilateral conjunctivitis.  I think the conjunctivitis is likely viral.  Patient has been rubbing the eye likely causing her subconjunctival hemorrhage.  There is no uptake on fluorescein stain and no foreign body.  Patient has normal eye pressure in the right eye.  Patient has no obvious hyphema.  I recommend she use Polytrim every 4 hours.  She has been prescribed pain medicine for her left trimalleolar fracture and I encouraged her to take it.  Also encouraged her to follow-up with ophthalmology.   Problems Addressed: Conjunctivitis of both eyes, unspecified conjunctivitis type: acute illness or injury Subconjunctival hemorrhage of right eye: acute illness or injury  Risk Prescription drug management.    Final Clinical Impression(s) / ED Diagnoses Final diagnoses:  None    Rx / DC Orders ED Discharge Orders     None         Charlynne Pander, MD 07/10/23 2253

## 2023-07-10 NOTE — ED Triage Notes (Signed)
Pt states she believes she has pink eye.  Was started on anbx ointment this morning.  Pt reports pain and redness significantly increased about an hour ago.

## 2023-07-10 NOTE — Telephone Encounter (Signed)
Continuing the conversation you have been having to Pam about Pt. She is very congested and her sinus are "burning".

## 2023-07-11 DIAGNOSIS — S82852S Displaced trimalleolar fracture of left lower leg, sequela: Secondary | ICD-10-CM | POA: Diagnosis not present

## 2023-07-11 DIAGNOSIS — R2689 Other abnormalities of gait and mobility: Secondary | ICD-10-CM | POA: Diagnosis not present

## 2023-07-12 ENCOUNTER — Emergency Department (HOSPITAL_BASED_OUTPATIENT_CLINIC_OR_DEPARTMENT_OTHER)
Admission: EM | Admit: 2023-07-12 | Discharge: 2023-07-12 | Disposition: A | Discharge status: Home / Self Care | Payer: Medicare HMO | Attending: Emergency Medicine | Admitting: Emergency Medicine

## 2023-07-12 ENCOUNTER — Encounter (HOSPITAL_BASED_OUTPATIENT_CLINIC_OR_DEPARTMENT_OTHER): Payer: Self-pay

## 2023-07-12 DIAGNOSIS — I10 Essential (primary) hypertension: Secondary | ICD-10-CM | POA: Diagnosis not present

## 2023-07-12 DIAGNOSIS — Z79899 Other long term (current) drug therapy: Secondary | ICD-10-CM | POA: Diagnosis not present

## 2023-07-12 DIAGNOSIS — L304 Erythema intertrigo: Secondary | ICD-10-CM | POA: Diagnosis not present

## 2023-07-12 DIAGNOSIS — H1013 Acute atopic conjunctivitis, bilateral: Secondary | ICD-10-CM | POA: Diagnosis not present

## 2023-07-12 DIAGNOSIS — H5789 Other specified disorders of eye and adnexa: Secondary | ICD-10-CM | POA: Diagnosis not present

## 2023-07-12 MED ORDER — OLOPATADINE HCL 0.1 % OP SOLN: 1.0000 [drp] | Freq: Two times a day (BID) | OPHTHALMIC | 0 refills | Status: DC

## 2023-07-12 MED ORDER — KETOCONAZOLE 2 % EX CREA: 1.0000 | TOPICAL_CREAM | Freq: Two times a day (BID) | CUTANEOUS | 0 refills | Status: DC

## 2023-07-12 MED ORDER — DESITIN 40 % EX PSTE: 1.0000 | PASTE | Freq: Three times a day (TID) | CUTANEOUS | 0 refills | Status: DC | PRN

## 2023-07-12 MED ORDER — DIPHENHYDRAMINE HCL 25 MG PO CAPS
25.0000 mg | ORAL_CAPSULE | Freq: Once | ORAL | Status: AC
  Administered 2023-07-12: 25 mg via ORAL
  Filled 2023-07-12: qty 1

## 2023-07-12 MED ORDER — HYDROXYZINE HCL 25 MG PO TABS: 25.0000 mg | ORAL_TABLET | Freq: Four times a day (QID) | ORAL | 0 refills | Status: DC

## 2023-07-12 NOTE — ED Notes (Signed)
Reviewed AVS/discharge instruction with patient. Time allotted for and all questions answered. Patient is agreeable for d/c and escorted to ed exit by staff.  

## 2023-07-12 NOTE — Discharge Instructions (Signed)
You can use benadryl for a few days to help with the irritation around eyes, as well as hydroxyzine to help with sensation of itching. Apply the ketoconzole cream as well as desitin to groin folds. Avoid itching as much as you are able. You can use the pataday eye solution to help with the redness, irritation as well.  If symptoms worsen despite treatment recommend following up with your PCP return the emergency department for further evaluation and management.

## 2023-07-12 NOTE — ED Triage Notes (Signed)
Onset Wednesday of bilateral eye redness and drainage.  Seen MD for such and using antibiotic ointment   Sates not working and now developed rash to groin area

## 2023-07-12 NOTE — ED Provider Notes (Signed)
[place] [facility] AT [facility] Provider Note   CSN: [id number] Arrival date & time: 07/12/23  1057     History  Chief Complaint  Patient presents with   Eye Drainage    Haley White is a 67 y.o. female past medical history significant for anxiety, hypertension, hyperlipidemia, and recent trimalleolar fracture on the left who presents with concern for some ongoing bilateral eye redness and drainage. Additionally complains of bilateral groin, irritation, rash. Reports frequent scratching.  HPI     Home Medications Prior to Admission medications   Medication Sig Start Date End Date Taking? Authorizing Provider  hydrOXYzine (ATARAX) 25 MG tablet Take 1 tablet (25 mg total) by mouth every 6 (six) hours. As needed for itching 07/12/23  Yes Airen Dales H, PA-C  ketoconazole (NIZORAL) 2 % cream Apply 1 Application topically 2 (two) times daily. 07/12/23  Yes Marsela Kuan H, PA-C  olopatadine (PATADAY) 0.1 % ophthalmic solution Place 1 drop into both eyes 2 (two) times daily. 07/12/23  Yes Bomani Oommen H, PA-C  Zinc Oxide (DESITIN) 40 % PSTE Apply 1 Application topically 3 (three) times daily as needed. 07/12/23  Yes Aneeka Bowden H, PA-C  albuterol (VENTOLIN HFA) 108 (90 Base) MCG/ACT inhaler Inhale 2 puffs into the lungs every 4 (four) hours as needed for wheezing or shortness of breath. 07/17/17   Judd Gaudier, NP  albuterol (VENTOLIN HFA) 108 (90 Base) MCG/ACT inhaler Inhale 2 puffs into the lungs every 6 (six) hours as needed for wheezing or shortness of breath.    [provider]  bacitracin-polymyxin b (POLYSPORIN) ophthalmic ointment Place into the left eye 2 (two) times daily for 10 days. Place a 1/2 inch ribbon of ointment into the lower eyelid. 07/10/23 07/20/23  Raynelle Dick, NP  budesonide (PULMICORT) 0.5 MG/2ML nebulizer solution Take 0.5 mg by nebulization daily as needed.    [provider]  budesonide-formoterol  (SYMBICORT) 160-4.5 MCG/ACT inhaler Inhale 2 puffs into the lungs daily as needed (wheezing).    [provider]  buPROPion (WELLBUTRIN XL) 300 MG 24 hr tablet Take 300 mg by mouth every morning. 12/17/20   [provider]  buPROPion (WELLBUTRIN XL) 300 MG 24 hr tablet Take 300 mg by mouth daily.    [provider]  cholecalciferol (VITAMIN D3) 25 MCG (1000 UNIT) tablet Take 1,000 Units by mouth daily.    [provider]  docusate sodium (COLACE) 100 MG capsule Take 1 capsule (100 mg total) by mouth 2 (two) times daily. 06/07/23   Amin, Ankit C, MD  EPINEPHrine 0.3 mg/0.3 mL IJ SOAJ injection See admin instructions. 02/08/20   [provider]  fexofenadine (ALLEGRA) 180 MG tablet Take 180 mg by mouth daily as needed for allergies.    [provider]  FLUoxetine (PROZAC) 20 MG capsule Take 60 mg by mouth daily. 02/23/23   [provider]  FLUoxetine (PROZAC) 20 MG capsule Take 60 mg by mouth daily.    [provider]  fluticasone (FLONASE) 50 MCG/ACT nasal spray Place 2 sprays into both nostrils daily. 10/17/17   [provider]  fluticasone (FLONASE) 50 MCG/ACT nasal spray Place 1 spray into both nostrils daily as needed for allergies or rhinitis.    [provider]  levocetirizine (XYZAL) 5 MG tablet Take 5 mg by mouth every evening. 12/07/17   [provider]  lisinopril (ZESTRIL) 20 MG tablet Take  1 tablet  Daily for BP                                                            /  TAKE                                         BY                                        MOUTH 11/22/22   Lucky Cowboy, MD  lisinopril (ZESTRIL) 20 MG tablet Take 20 mg by mouth daily.    [provider]  methocarbamol (ROBAXIN) 500 MG tablet Take 1 tablet (500 mg total) by mouth every 8 (eight) hours as needed for muscle spasms. 06/03/22   Lurline Idol, FNP  methocarbamol (ROBAXIN)  500 MG tablet Take 1 tablet (500 mg total) by mouth every 8 (eight) hours as needed for muscle spasms. 06/07/23   Amin, Ankit C, MD  methylphenidate (RITALIN LA) 10 MG 24 hr capsule Take 1 capsule (10 mg total) by mouth daily. 06/07/23   Amin, Ankit C, MD  methylphenidate (RITALIN) 10 MG tablet Take 10 mg by mouth 2 (two) times daily. 01/16/18   [provider]  montelukast (SINGULAIR) 10 MG tablet Take 10 mg by mouth daily.    [provider]  montelukast (SINGULAIR) 10 MG tablet Take 10 mg by mouth at bedtime.    [provider]  NON FORMULARY Inject 1 Dose into the skin once a week. Allergy shots from [facility] [place]. Patient not sure about the name of medicine    [provider]  oxyCODONE (OXY IR/ROXICODONE) 5 MG immediate release tablet Take 1-2 tablets (5-10 mg total) by mouth every 4 (four) hours as needed for moderate pain, severe pain or breakthrough pain. 06/07/23   Amin, Ankit C, MD  pantoprazole (PROTONIX) 40 MG tablet TAKE 1 TABLET BY MOUTH DAILY TO PREVENT INDIGESTION AND HEARTBURN 06/20/23   Raynelle Dick, NP  polyethylene glycol (MIRALAX / GLYCOLAX) 17 g packet Take 17 g by mouth 2 (two) times daily. 06/07/23   Amin, Ankit C, MD  rosuvastatin (CRESTOR) 40 MG tablet Take 40 mg by mouth daily. 02/20/23   [provider]  rosuvastatin (CRESTOR) 40 MG tablet Take 40 mg by mouth daily.    [provider]  SYMBICORT 160-4.5 MCG/ACT inhaler Inhale 2 puffs into the lungs 2 (two) times daily. 01/16/18   [provider]  traMADol (ULTRAM) 50 MG tablet Take 1 tablet (50 mg total) by mouth every 6 (six) hours as needed. 05/16/23   Raynelle Dick, NP  traMADol (ULTRAM) 50 MG tablet Take 1 tablet (50 mg total) by mouth every 8 (eight) hours as needed for moderate pain. 06/07/23   Amin, Ankit C, MD  triamcinolone cream (KENALOG) 0.1 % Apply 1 Application topically 2 (two) times daily. 04/14/23   Schutt, Edsel Petrin, PA-C   valACYclovir (VALTREX) 1000 MG tablet Take 1 tablet (1,000 mg total) by mouth 3 (three) times daily. Take for 7 days 05/16/23   Raynelle Dick, NP  VITAMIN D PO Take 10,000 Units by mouth daily.    [provider]      Allergies    Molds & smuts, Biaxin [clarithromycin], Biaxin [clarithromycin], Prednisone, Prednisone, Serevent [salmeterol], Tequin [gatifloxacin], Tequin [gatifloxacin], and Serevent [salmeterol]    Review of Systems   Review of Systems  Skin:  Positive for rash.  All other systems reviewed  and are negative.   Physical Exam Updated Vital Signs BP (!) 156/83 (BP Location: Left Arm)   Pulse 76   Temp 98.6 F (37 C)   Resp 18   Ht 5\' 4"  (1.626 m)   Wt 127 kg   SpO2 100%   BMI 48.06 kg/m  Physical Exam Vitals and nursing note reviewed.  Constitutional:      General: She is not in acute distress.    Appearance: Normal appearance.  HENT:     Head: Normocephalic and atraumatic.  Eyes:     General:        Right eye: No discharge.        Left eye: No discharge.  Cardiovascular:     Rate and Rhythm: Normal rate and regular rhythm.  Pulmonary:     Effort: Pulmonary effort is normal. No respiratory distress.  Musculoskeletal:        General: No deformity.  Skin:    General: Skin is warm and dry.     Comments: Patient with redness, dryness, irritation around bilateral eyes, she has subconjunctival hemorrhage on right.  Likely secondary to scratching.  Normal extraocular movements throughout, pupils are equal round reactive to light.  Patient with red beefy rash in bilateral groin folds with some scattered satellite lesions.  Signs of excoriation, itching, no evidence of secondary bacterial infection noted.  Neurological:     Mental Status: She is alert and oriented to person, place, and time.  Psychiatric:        Mood and Affect: Mood normal.        Behavior: Behavior normal.     ED Results / Procedures / Treatments   Labs (all labs ordered are  listed, but only abnormal results are displayed) Labs Reviewed - No data to display  EKG None  Radiology No results found.  Procedures Procedures    Medications Ordered in ED Medications  diphenhydrAMINE (BENADRYL) capsule 25 mg (has no administration in time range)    ED Course/ Medical Decision Making/ A&P                                 Medical Decision Making  This patient is a 67 y.o. female who presents to the ED for concern of eye irritation, groin rash.   Differential diagnoses prior to evaluation: Cellulitis, intertrigo, ongoing bacterial conjunctivitis, allergic conjunctivitis, viral conjunctivitis, periorbital cellulitis, versus other, overall low clinical suspicion for  Past Medical History / Social History / Additional history: Chart reviewed. Pertinent results include: History of allergies, reports had been getting allergy shots but has not had any recently, recent travel fracture, currently taking narcotics for pain  Physical Exam: Physical exam performed. The pertinent findings include: Patient with redness, dryness, irritation around bilateral eyes, she has subconjunctival hemorrhage on right.  Likely secondary to scratching.  Normal extraocular movements throughout, pupils are equal round reactive to light.  Patient with red beefy rash in bilateral groin folds with some scattered satellite lesions.  Signs of excoriation, itching, no evidence of secondary bacterial infection noted.   Medications / Treatment: Administered Benadryl for itching, discharged with Desitin, encouraged hydroxyzine, Desitin cream, Pataday ophthalmic drops, ketoconazole cream for intertrigo encouraged follow-up with PCP   Disposition: After consideration of the diagnostic results and the patients response to treatment, I feel that patient with evidence of allergic conjunctivitis, skin irritation, as well as intertrigo, likely with increased itching secondary to her current narcotic  use.   emergency department workup does not suggest an emergent condition requiring admission or immediate intervention beyond what has been performed at this time. The plan is: as above. The patient is safe for discharge and has been instructed to return immediately for worsening symptoms, change in symptoms or any other concerns.  Final Clinical Impression(s) / ED Diagnoses Final diagnoses:  Allergic conjunctivitis of both eyes  Intertrigo    Rx / DC Orders ED Discharge Orders          Ordered    olopatadine (PATADAY) 0.1 % ophthalmic solution  2 times daily        07/12/23 1134    hydrOXYzine (ATARAX) 25 MG tablet  Every 6 hours        07/12/23 1134    Zinc Oxide (DESITIN) 40 % PSTE  3 times daily PRN        07/12/23 1134    ketoconazole (NIZORAL) 2 % cream  2 times daily        07/12/23 1134              Bensyn Bornemann, [place] H, PA-C 07/12/23 1149    Terald Sleeper, MD 07/12/23 1221

## 2023-07-13 ENCOUNTER — Emergency Department (HOSPITAL_BASED_OUTPATIENT_CLINIC_OR_DEPARTMENT_OTHER)
Admission: EM | Admit: 2023-07-13 | Discharge: 2023-07-14 | Disposition: A | Discharge status: Home / Self Care | Payer: Medicare HMO | Attending: Emergency Medicine | Admitting: Emergency Medicine

## 2023-07-13 ENCOUNTER — Other Ambulatory Visit: Payer: Self-pay

## 2023-07-13 ENCOUNTER — Encounter: Payer: Self-pay | Admitting: Nurse Practitioner

## 2023-07-13 ENCOUNTER — Encounter: Payer: Self-pay | Admitting: Internal Medicine

## 2023-07-13 DIAGNOSIS — L309 Dermatitis, unspecified: Secondary | ICD-10-CM | POA: Diagnosis not present

## 2023-07-13 DIAGNOSIS — B3749 Other urogenital candidiasis: Secondary | ICD-10-CM | POA: Insufficient documentation

## 2023-07-13 DIAGNOSIS — I10 Essential (primary) hypertension: Secondary | ICD-10-CM | POA: Insufficient documentation

## 2023-07-13 DIAGNOSIS — B3789 Other sites of candidiasis: Secondary | ICD-10-CM

## 2023-07-13 DIAGNOSIS — J45909 Unspecified asthma, uncomplicated: Secondary | ICD-10-CM | POA: Diagnosis not present

## 2023-07-13 DIAGNOSIS — H01133 Eczematous dermatitis of right eye, unspecified eyelid: Secondary | ICD-10-CM | POA: Insufficient documentation

## 2023-07-13 DIAGNOSIS — H01136 Eczematous dermatitis of left eye, unspecified eyelid: Secondary | ICD-10-CM | POA: Insufficient documentation

## 2023-07-13 DIAGNOSIS — H019 Unspecified inflammation of eyelid: Secondary | ICD-10-CM

## 2023-07-13 DIAGNOSIS — Z20822 Contact with and (suspected) exposure to covid-19: Secondary | ICD-10-CM | POA: Diagnosis not present

## 2023-07-13 DIAGNOSIS — H5789 Other specified disorders of eye and adnexa: Secondary | ICD-10-CM | POA: Diagnosis present

## 2023-07-13 DIAGNOSIS — R21 Rash and other nonspecific skin eruption: Secondary | ICD-10-CM | POA: Diagnosis not present

## 2023-07-13 DIAGNOSIS — R7309 Other abnormal glucose: Secondary | ICD-10-CM | POA: Diagnosis not present

## 2023-07-13 NOTE — ED Triage Notes (Signed)
Pt has had redness, itching and swelling of eyes x 5 days and has been seen here and [facility] for same and told that it was allergies. Pt here because symptoms continue

## 2023-07-14 LAB — CBC WITH DIFFERENTIAL/PLATELET
Abs Immature Granulocytes: 0.01 10*3/uL (ref 0.00–0.07)
Basophils Absolute: 0 10*3/uL (ref 0.0–0.1)
Basophils Relative: 0 %
Eosinophils Absolute: 0.5 10*3/uL (ref 0.0–0.5)
Eosinophils Relative: 6 %
HCT: 37.6 % (ref 36.0–46.0)
Hemoglobin: 12.7 g/dL (ref 12.0–15.0)
Immature Granulocytes: 0 %
Lymphocytes Relative: 30 %
Lymphs Abs: 2.5 10*3/uL (ref 0.7–4.0)
MCH: 31.4 pg (ref 26.0–34.0)
MCHC: 33.8 g/dL (ref 30.0–36.0)
MCV: 92.8 fL (ref 80.0–100.0)
Monocytes Absolute: 0.8 10*3/uL (ref 0.1–1.0)
Monocytes Relative: 10 %
Neutro Abs: 4.5 10*3/uL (ref 1.7–7.7)
Neutrophils Relative %: 54 %
Platelets: 281 10*3/uL (ref 150–400)
RBC: 4.05 MIL/uL (ref 3.87–5.11)
RDW: 12.1 % (ref 11.5–15.5)
WBC: 8.3 10*3/uL (ref 4.0–10.5)
nRBC: 0 % (ref 0.0–0.2)

## 2023-07-14 LAB — RESP PANEL BY RT-PCR (RSV, FLU A&B, COVID)  RVPGX2
Influenza A by PCR: NEGATIVE
Influenza B by PCR: NEGATIVE
Resp Syncytial Virus by PCR: NEGATIVE
SARS Coronavirus 2 by RT PCR: NEGATIVE

## 2023-07-14 LAB — BASIC METABOLIC PANEL
Anion gap: 11 (ref 5–15)
BUN: 14 mg/dL (ref 8–23)
CO2: 23 mmol/L (ref 22–32)
Calcium: 9.2 mg/dL (ref 8.9–10.3)
Chloride: 110 mmol/L (ref 98–111)
Creatinine, Ser: 0.79 mg/dL (ref 0.44–1.00)
GFR, Estimated: >60 mL/min (ref >60)
Glucose, Bld: 137 mg/dL — ABNORMAL HIGH (ref 70–99)
Potassium: 3.5 mmol/L (ref 3.5–5.1)
Sodium: 144 mmol/L (ref 135–145)

## 2023-07-14 LAB — CBG MONITORING, ED: Glucose-Capillary: 135 mg/dL — ABNORMAL HIGH (ref 70–99)

## 2023-07-14 MED ORDER — CEPHALEXIN 500 MG PO CAPS: 500.0000 mg | ORAL_CAPSULE | Freq: Four times a day (QID) | ORAL | 0 refills | Status: DC

## 2023-07-14 MED ORDER — IBUPROFEN 400 MG PO TABS
600.0000 mg | ORAL_TABLET | Freq: Once | ORAL | Status: AC
  Administered 2023-07-14: 600 mg via ORAL
  Filled 2023-07-14: qty 1

## 2023-07-14 MED ORDER — NYSTATIN 100000 UNIT/GM EX POWD: 1.0000 | Freq: Three times a day (TID) | CUTANEOUS | 0 refills | Status: DC

## 2023-07-14 MED ORDER — LORATADINE 10 MG PO TABS
10.0000 mg | ORAL_TABLET | Freq: Every day | ORAL | Status: DC
  Administered 2023-07-14: 10 mg via ORAL
  Filled 2023-07-14: qty 1

## 2023-07-14 MED ORDER — FLUCONAZOLE 150 MG PO TABS
150.0000 mg | ORAL_TABLET | Freq: Once | ORAL | Status: AC
  Administered 2023-07-14: 150 mg via ORAL
  Filled 2023-07-14: qty 1

## 2023-07-14 NOTE — Discharge Instructions (Signed)
You were seen today for a rash around the eyes and the groin.  Your groin rash is consistent with a yeast infection.  Stop ketoconazole cream and start nystatin powder.  Instead of Allegra, take Zyrtec daily.  Make sure that you are using soaps and detergents that are free and clear of any dyes or scents.  I will also give you a prescription for antibiotic given there is some concern for superimposed infection especially in the groin.  Follow-up closely with your primary doctor.

## 2023-07-14 NOTE — ED Provider Notes (Signed)
[place] EMERGENCY DEPARTMENT AT [facility] Provider Note   CSN: [id number] Arrival date & time: 07/13/23  2326     History  Chief Complaint  Patient presents with   Eye Problem    Haley White is a 67 y.o. female.  HPI     This is a 67 year old female who presents with persistent eye itching and rash as well as groin rash.  Patient has been seen and evaluated twice for similar complaints.  Of note, patient was recently in the hospital and rehab for a left leg and foot fracture.  She went home 1 week ago.  Since that time she has developed itching and watering of the eyes bilaterally as well as an eye rash in her groin rash.  She denies any new soaps, detergents, lotions.  She states that initially she was treated with pinkeye with an antibiotic.  She subsequently developed a subconjunctival hemorrhage.  She is also noted a groin rash.  She states that it is itchy and slightly painful.  Regarding her eyes, she has some clear discharge.  She takes Careers adviser daily.  No history of eczema.  She was started on ketoconazole cream for her groin which was consistent at the time with a candidal infection.  Patient states that since that time she has developed a low-grade fever.  She took a COVID test at home which was negative.  Home Medications Prior to Admission medications   Medication Sig Start Date End Date Taking? Authorizing Provider  cephALEXin (KEFLEX) 500 MG capsule Take 1 capsule (500 mg total) by mouth 4 (four) times daily. 07/14/23  Yes Margart Zemanek, Mayer Masker, MD  nystatin (MYCOSTATIN/NYSTOP) powder Apply 1 Application topically 3 (three) times daily. To groin rash 07/14/23  Yes Chan Sheahan, Mayer Masker, MD  albuterol (VENTOLIN HFA) 108 (90 Base) MCG/ACT inhaler Inhale 2 puffs into the lungs every 4 (four) hours as needed for wheezing or shortness of breath. 07/17/17   Judd Gaudier, NP  albuterol (VENTOLIN HFA) 108 (90 Base) MCG/ACT inhaler Inhale 2 puffs into the lungs every 6 (six)  hours as needed for wheezing or shortness of breath.    [provider]  bacitracin-polymyxin b (POLYSPORIN) ophthalmic ointment Place into the left eye 2 (two) times daily for 10 days. Place a 1/2 inch ribbon of ointment into the lower eyelid. 07/10/23 07/20/23  Raynelle Dick, NP  budesonide (PULMICORT) 0.5 MG/2ML nebulizer solution Take 0.5 mg by nebulization daily as needed.    [provider]  budesonide-formoterol (SYMBICORT) 160-4.5 MCG/ACT inhaler Inhale 2 puffs into the lungs daily as needed (wheezing).    [provider]  buPROPion (WELLBUTRIN XL) 300 MG 24 hr tablet Take 300 mg by mouth every morning. 12/17/20   [provider]  buPROPion (WELLBUTRIN XL) 300 MG 24 hr tablet Take 300 mg by mouth daily.    [provider]  cholecalciferol (VITAMIN D3) 25 MCG (1000 UNIT) tablet Take 1,000 Units by mouth daily.    [provider]  docusate sodium (COLACE) 100 MG capsule Take 1 capsule (100 mg total) by mouth 2 (two) times daily. 06/07/23   Amin, Ankit C, MD  EPINEPHrine 0.3 mg/0.3 mL IJ SOAJ injection See admin instructions. 02/08/20   [provider]  fexofenadine (ALLEGRA) 180 MG tablet Take 180 mg by mouth daily as needed for allergies.    [provider]  FLUoxetine (PROZAC) 20 MG capsule Take 60 mg by mouth daily. 02/23/23   [provider]  FLUoxetine (  PROZAC) 20 MG capsule Take 60 mg by mouth daily.    [provider]  fluticasone (FLONASE) 50 MCG/ACT nasal spray Place 2 sprays into both nostrils daily. 10/17/17   [provider]  fluticasone (FLONASE) 50 MCG/ACT nasal spray Place 1 spray into both nostrils daily as needed for allergies or rhinitis.    [provider]  hydrOXYzine (ATARAX) 25 MG tablet Take 1 tablet (25 mg total) by mouth every 6 (six) hours. As needed for itching 07/12/23   Prosperi, Christian H, PA-C  levocetirizine (XYZAL) 5 MG tablet Take 5 mg by mouth every evening.  12/07/17   [provider]  lisinopril (ZESTRIL) 20 MG tablet Take  1 tablet  Daily for BP                                                            /                                    TAKE                                         BY                                        MOUTH 11/22/22   Lucky Cowboy, MD  lisinopril (ZESTRIL) 20 MG tablet Take 20 mg by mouth daily.    [provider]  methocarbamol (ROBAXIN) 500 MG tablet Take 1 tablet (500 mg total) by mouth every 8 (eight) hours as needed for muscle spasms. 06/03/22   Lurline Idol, FNP  methocarbamol (ROBAXIN) 500 MG tablet Take 1 tablet (500 mg total) by mouth every 8 (eight) hours as needed for muscle spasms. 06/07/23   Amin, Ankit C, MD  methylphenidate (RITALIN LA) 10 MG 24 hr capsule Take 1 capsule (10 mg total) by mouth daily. 06/07/23   Amin, Ankit C, MD  methylphenidate (RITALIN) 10 MG tablet Take 10 mg by mouth 2 (two) times daily. 01/16/18   [provider]  montelukast (SINGULAIR) 10 MG tablet Take 10 mg by mouth daily.    [provider]  montelukast (SINGULAIR) 10 MG tablet Take 10 mg by mouth at bedtime.    [provider]  NON FORMULARY Inject 1 Dose into the skin once a week. Allergy shots from [facility] at [place]. Patient not sure about the name of medicine    [provider]  olopatadine (PATADAY) 0.1 % ophthalmic solution Place 1 drop into both eyes 2 (two) times daily. 07/12/23   Prosperi, Christian H, PA-C  oxyCODONE (OXY IR/ROXICODONE) 5 MG immediate release tablet Take 1-2 tablets (5-10 mg total) by mouth every 4 (four) hours as needed for moderate pain, severe pain or breakthrough pain. 06/07/23   Amin, Ankit C, MD  pantoprazole (PROTONIX) 40 MG tablet TAKE 1 TABLET BY MOUTH DAILY TO PREVENT INDIGESTION AND HEARTBURN 06/20/23   Raynelle Dick, NP  polyethylene glycol (MIRALAX / GLYCOLAX) 17 g packet Take 17 g  by mouth 2 (two) times daily. 06/07/23   Amin, Ankit C,  MD  rosuvastatin (CRESTOR) 40 MG tablet Take 40 mg by mouth daily. 02/20/23   [provider]  rosuvastatin (CRESTOR) 40 MG tablet Take 40 mg by mouth daily.    [provider]  SYMBICORT 160-4.5 MCG/ACT inhaler Inhale 2 puffs into the lungs 2 (two) times daily. 01/16/18   [provider]  traMADol (ULTRAM) 50 MG tablet Take 1 tablet (50 mg total) by mouth every 6 (six) hours as needed. 05/16/23   Raynelle Dick, NP  traMADol (ULTRAM) 50 MG tablet Take 1 tablet (50 mg total) by mouth every 8 (eight) hours as needed for moderate pain. 06/07/23   Amin, Ankit C, MD  triamcinolone cream (KENALOG) 0.1 % Apply 1 Application topically 2 (two) times daily. 04/14/23   Schutt, Edsel Petrin, PA-C  valACYclovir (VALTREX) 1000 MG tablet Take 1 tablet (1,000 mg total) by mouth 3 (three) times daily. Take for 7 days 05/16/23   Raynelle Dick, NP  VITAMIN D PO Take 10,000 Units by mouth daily.    [provider]  Zinc Oxide (DESITIN) 40 % PSTE Apply 1 Application topically 3 (three) times daily as needed. 07/12/23   Prosperi, Christian H, PA-C      Allergies    Molds & smuts, Biaxin [clarithromycin], Biaxin [clarithromycin], Prednisone, Prednisone, Serevent [salmeterol], Tequin [gatifloxacin], Tequin [gatifloxacin], and Serevent [salmeterol]    Review of Systems   Review of Systems  Constitutional:  Positive for fever.  Eyes:  Positive for discharge, redness and itching. Negative for visual disturbance.  Respiratory:  Negative for cough and shortness of breath.   Cardiovascular:  Negative for chest pain.  Skin:  Positive for rash.  All other systems reviewed and are negative.   Physical Exam Updated Vital Signs BP (!) 145/80   Pulse 83   Temp 97.9 F (36.6 C) (Oral)   Resp 18   SpO2 95%  Physical Exam Vitals and nursing note reviewed.  Constitutional:      Appearance: She is well-developed. She is obese. She is not toxic-appearing.  HENT:     Head: Normocephalic  and atraumatic.     Nose: Nose normal.     Mouth/Throat:     Mouth: Mucous membranes are moist.  Eyes:     Extraocular Movements: Extraocular movements intact.     Pupils: Pupils are equal, round, and reactive to light.     Comments: Bilateral eyelids and periorbital tissue with erythematous scaly rash, no significant edema, subconjunctival hemorrhage noted right lateral sclera, pupils equal round and reactive, extraocular movements intact  Cardiovascular:     Rate and Rhythm: Normal rate and regular rhythm.     Heart sounds: Normal heart sounds.  Pulmonary:     Effort: Pulmonary effort is normal. No respiratory distress.     Breath sounds: No wheezing.  Abdominal:     Palpations: Abdomen is soft.  Musculoskeletal:     Cervical back: Neck supple.     Comments: Cast left lower extremity  Skin:    General: Skin is warm and dry.     Comments: Oozing beefy red rash in the bilateral groin area with satellite lesions noted, slight warmth  Neurological:     Mental Status: She is alert and oriented to person, place, and time.  Psychiatric:        Mood and Affect: Mood normal.        ED Results / Procedures / Treatments  Labs (all labs ordered are listed, but only abnormal results are displayed) Labs Reviewed  BASIC METABOLIC PANEL - Abnormal; Notable for the following components:      Result Value   Glucose, Bld 137 (*)    All other components within normal limits  CBG MONITORING, ED - Abnormal; Notable for the following components:   Glucose-Capillary 135 (*)    All other components within normal limits  RESP PANEL BY RT-PCR (RSV, FLU A&B, COVID)  RVPGX2  CBC WITH DIFFERENTIAL/PLATELET    EKG None  Radiology No results found.  Procedures Procedures    Medications Ordered in ED Medications  loratadine (CLARITIN) tablet 10 mg (10 mg Oral Given 07/14/23 0039)  ibuprofen (ADVIL) tablet 600 mg (600 mg Oral Given 07/14/23 0118)  fluconazole (DIFLUCAN) tablet 150 mg (150  mg Oral Given 07/14/23 0118)    ED Course/ Medical Decision Making/ A&P                                 Medical Decision Making Amount and/or Complexity of Data Reviewed Labs: ordered.  Risk OTC drugs. Prescription drug management.   This patient presents to the ED for concern of eye rash, groin rash, this involves an extensive number of treatment options, and is a complaint that carries with it a high risk of complications and morbidity.  I considered the following differential and admission for this acute, potentially life threatening condition.  The differential diagnosis includes topic dermatitis, contact dermatitis, septal or preseptal cellulitis, superimposed bacterial infection, candidal infection, viral rash  MDM:    This is a 67 year old female who presents with rash of both eyes and the groin.  She is nontoxic and vital signs are reassuring.  She is afebrile here but does report fever at home.  Regarding eyes, physical exam is most consistent with an atopic or contact dermatitis.  Low suspicion for septal or preseptal cellulitis.  Patient's symptoms started when she went back from rehab to home.  She does not believe she has been exposed to any new detergents or soaps or environmental factors but it could be possible.  Additionally she appears to have a candidal infection of her groin that is progressing.  She is currently on ketoconazole.  There is some resistance to ketoconazole.  It is also fairly warm and I suspect she may have some superimposed bacterial infection given skin breakdown related to yeast.  Labs obtained and largely reassuring including negative COVID testing and basic CBC and BMP.  Overall recommend to treat eyelids as a contact or atopic dermatitis.  Will transition from Allegra to Zyrtec.  Continue high xylazine or Benadryl as needed for itching.  Avoid any scents or dyes and detergents and soaps.  Additionally will transition from ketoconazole to nystatin powder and  start on Keflex for potential overlying bacterial infection.  Patient and her daughter were updated and are agreeable to plan.  (Labs, imaging, consults)  Labs: I Ordered, and personally interpreted labs.  The pertinent results include: CBC, BMP, COVID  Imaging Studies ordered: I ordered imaging studies including none I independently visualized and interpreted imaging. I agree with the radiologist interpretation  Additional history obtained from daughter at bedside.  External records from outside source obtained and reviewed including prior evaluations  Cardiac Monitoring: The patient was not maintained on a cardiac monitor.  If on the cardiac monitor, I personally viewed and interpreted the cardiac monitored which showed an  underlying rhythm of: N/A  Reevaluation: After the interventions noted above, I reevaluated the patient and found that they have :stayed the same  Social Determinants of Health:  lives independently  Disposition: Discharge  Co morbidities that complicate the patient evaluation  Past Medical History:  Diagnosis Date   Anxiety    Arthritis    Asthma    Cataract    Depression    Gout    Hyperlipidemia    Hypertension    Obese      Medicines Meds ordered this encounter  Medications   loratadine (CLARITIN) tablet 10 mg   ibuprofen (ADVIL) tablet 600 mg   fluconazole (DIFLUCAN) tablet 150 mg   nystatin (MYCOSTATIN/NYSTOP) powder    Sig: Apply 1 Application topically 3 (three) times daily. To groin rash    Dispense:  15 g    Refill:  0   cephALEXin (KEFLEX) 500 MG capsule    Sig: Take 1 capsule (500 mg total) by mouth 4 (four) times daily.    Dispense:  20 capsule    Refill:  0    I have reviewed the patients home medicines and have made adjustments as needed  Problem List / ED Course: Problem List Items Addressed This Visit   None Visit Diagnoses     Dermatitis of eyelids of both eyes, unspecified type    -  Primary   Candida rash of groin        Relevant Medications   fluconazole (DIFLUCAN) tablet 150 mg (Completed)   nystatin (MYCOSTATIN/NYSTOP) powder   cephALEXin (KEFLEX) 500 MG capsule                   Final Clinical Impression(s) / ED Diagnoses Final diagnoses:  Dermatitis of eyelids of both eyes, unspecified type  Candida rash of groin    Rx / DC Orders ED Discharge Orders          Ordered    nystatin (MYCOSTATIN/NYSTOP) powder  3 times daily        07/14/23 0226    cephALEXin (KEFLEX) 500 MG capsule  4 times daily        07/14/23 0226              Shon Baton, MD 07/14/23 [phone]

## 2023-07-15 DIAGNOSIS — G4733 Obstructive sleep apnea (adult) (pediatric): Secondary | ICD-10-CM | POA: Diagnosis not present

## 2023-07-15 DIAGNOSIS — F331 Major depressive disorder, recurrent, moderate: Secondary | ICD-10-CM | POA: Diagnosis not present

## 2023-07-16 ENCOUNTER — Ambulatory Visit (INDEPENDENT_AMBULATORY_CARE_PROVIDER_SITE_OTHER): Payer: Medicare HMO | Admitting: Nurse Practitioner

## 2023-07-16 ENCOUNTER — Encounter: Payer: Self-pay | Admitting: Nurse Practitioner

## 2023-07-16 VITALS — BP 130/74 | HR 94 | Temp 97.6°F

## 2023-07-16 DIAGNOSIS — H019 Unspecified inflammation of eyelid: Secondary | ICD-10-CM

## 2023-07-16 DIAGNOSIS — B372 Candidiasis of skin and nail: Secondary | ICD-10-CM | POA: Diagnosis not present

## 2023-07-16 DIAGNOSIS — Z1152 Encounter for screening for COVID-19: Secondary | ICD-10-CM

## 2023-07-16 DIAGNOSIS — B9689 Other specified bacterial agents as the cause of diseases classified elsewhere: Secondary | ICD-10-CM

## 2023-07-16 MED ORDER — FLUCONAZOLE 100 MG PO TABS
100.0000 mg | ORAL_TABLET | Freq: Every day | ORAL | 0 refills | Status: AC
Start: 2023-07-16 — End: 2023-07-30

## 2023-07-16 NOTE — Progress Notes (Signed)
Assessment and Plan:  Haley White was seen today for an episodic visit.  Diagnoses and all order for this visit:   Encounter for screening for COVID-19 Negative  - POC COVID-19  Dermatitis of eyelids of both eyes, unspecified type Discussed benefit of following up with eye doctor for possible culture of eyes to detect best source of treatment/antifungal versus broad spectrum as she has been unresponsive thus far. Ophthalmologist will also be able to assess overall eye structure for any penetrating infection of the eyes. Start systemic tmt with Diflucan.    - fluconazole (DIFLUCAN) 100 MG tablet; Take 1 tablet (100 mg total) by mouth daily for 14 days.  Dispense: 14 tablet; Refill: 0  Skin candidiasis Continue topical Nystatin Start systemic oral diflucan. Suggest Interdry sheets Keep area free of moisture.  - fluconazole (DIFLUCAN) 100 MG tablet; Take 1 tablet (100 mg total) by mouth daily for 14 days.  Dispense: 14 tablet; Refill: 0  Notify office for further evaluation and treatment, questions or concerns if s/s fail to improve. The risks and benefits of my recommendations, as well as other treatment options were discussed with the patient today. Questions were answered.  Further disposition pending results of labs. Discussed med's effects and SE's.    Over 15 minutes of exam, counseling, chart review, and critical decision making was performed.   Future Appointments  Date Time Provider Department Center  07/24/2023 10:30 AM Adela Glimpse, NP [facility] None  08/11/2023  3:30 PM Dohmeier, Porfirio Mylar, MD [facility] None  01/22/2024 11:00 AM Lucky Cowboy, MD [facility] None    ------------------------------------------------------------------------------------------------------------------   HPI BP 130/74   Pulse 94   Temp 97.6 F (36.4 C)   SpO2 96%   67 y.o.female presents for evaluation of dermatitis of eyelids associated by allergic/bacterial conjunctivitis.   Initial treatment started 9/5/204 after a long hospital stay/rehabilitants for left ankle fracture.  She was treated with Polysproin without relief.  Eyes have continued to remain itchy and red including periorbital skin.  Scleara area also red. She has some clear discharge. Her last ER visit was 07/13/23.  She has been treating with Cephalixin, Hydroxyzine, Pataday, Zyrtec and still has no relief.  She reports having a fever Tmax 103.5 F 2 days ago.  Of note she also has a groin rash/yeast.  No new soaps, lotions.  No hx of eczema/psoriasis.  She has tested negative for covid.  She does not currently follow with Ophthalmology.  Review of ER report states that referral was discussed but apts are 2-4 weeks out.  She continues to c/o blurry vision, itchy, watery eyes.  She continues to report bilateral groin rash mildly responsive to topical Nystatin powder.   Past Medical History:  Diagnosis Date   Anxiety    Arthritis    Asthma    Cataract    Depression    Gout    Hyperlipidemia    Hypertension    Obese      Allergies  Allergen Reactions   Molds & Smuts Anaphylaxis   Biaxin [Clarithromycin]     GI upset   Biaxin [Clarithromycin] Other (See Comments)    GI Upset   Prednisone     Was admitted for self harm when on prednisone before   Prednisone Other (See Comments)    Suicidal   Serevent [Salmeterol]     Palpitations   Tequin [Gatifloxacin]     GI upset. thrush   Tequin [Gatifloxacin] Other (See Comments)    GI upset, Thrush  Serevent [Salmeterol] Palpitations    Current Outpatient Medications on File Prior to Visit  Medication Sig   albuterol (VENTOLIN HFA) 108 (90 Base) MCG/ACT inhaler Inhale 2 puffs into the lungs every 4 (four) hours as needed for wheezing or shortness of breath.   albuterol (VENTOLIN HFA) 108 (90 Base) MCG/ACT inhaler Inhale 2 puffs into the lungs every 6 (six) hours as needed for wheezing or shortness of breath.   bacitracin-polymyxin b (POLYSPORIN)  ophthalmic ointment Place into the left eye 2 (two) times daily for 10 days. Place a 1/2 inch ribbon of ointment into the lower eyelid.   budesonide (PULMICORT) 0.5 MG/2ML nebulizer solution Take 0.5 mg by nebulization daily as needed.   budesonide-formoterol (SYMBICORT) 160-4.5 MCG/ACT inhaler Inhale 2 puffs into the lungs daily as needed (wheezing).   buPROPion (WELLBUTRIN XL) 300 MG 24 hr tablet Take 300 mg by mouth daily.   cephALEXin (KEFLEX) 500 MG capsule Take 1 capsule (500 mg total) by mouth 4 (four) times daily.   cholecalciferol (VITAMIN D3) 25 MCG (1000 UNIT) tablet Take 1,000 Units by mouth daily.   docusate sodium (COLACE) 100 MG capsule Take 1 capsule (100 mg total) by mouth 2 (two) times daily.   EPINEPHrine 0.3 mg/0.3 mL IJ SOAJ injection See admin instructions.   fexofenadine (ALLEGRA) 180 MG tablet Take 180 mg by mouth daily as needed for allergies.   FLUoxetine (PROZAC) 20 MG capsule Take 60 mg by mouth daily.   fluticasone (FLONASE) 50 MCG/ACT nasal spray Place 1 spray into both nostrils daily as needed for allergies or rhinitis.   hydrOXYzine (ATARAX) 25 MG tablet Take 1 tablet (25 mg total) by mouth every 6 (six) hours. As needed for itching   levocetirizine (XYZAL) 5 MG tablet Take 5 mg by mouth every evening.   lisinopril (ZESTRIL) 20 MG tablet Take 20 mg by mouth daily.   methocarbamol (ROBAXIN) 500 MG tablet Take 1 tablet (500 mg total) by mouth every 8 (eight) hours as needed for muscle spasms.   methylphenidate (RITALIN) 10 MG tablet Take 10 mg by mouth 2 (two) times daily.   montelukast (SINGULAIR) 10 MG tablet Take 10 mg by mouth at bedtime.   NON FORMULARY Inject 1 Dose into the skin once a week. Allergy shots from [facility] at [place]. Patient not sure about the name of medicine   nystatin (MYCOSTATIN/NYSTOP) powder Apply 1 Application topically 3 (three) times daily. To groin rash   olopatadine (PATADAY) 0.1 % ophthalmic solution Place 1 drop into both  eyes 2 (two) times daily.   oxyCODONE (OXY IR/ROXICODONE) 5 MG immediate release tablet Take 1-2 tablets (5-10 mg total) by mouth every 4 (four) hours as needed for moderate pain, severe pain or breakthrough pain.   pantoprazole (PROTONIX) 40 MG tablet TAKE 1 TABLET BY MOUTH DAILY TO PREVENT INDIGESTION AND HEARTBURN   polyethylene glycol (MIRALAX / GLYCOLAX) 17 g packet Take 17 g by mouth 2 (two) times daily.   rosuvastatin (CRESTOR) 40 MG tablet Take 40 mg by mouth daily.   SYMBICORT 160-4.5 MCG/ACT inhaler Inhale 2 puffs into the lungs 2 (two) times daily.   traMADol (ULTRAM) 50 MG tablet Take 1 tablet (50 mg total) by mouth every 8 (eight) hours as needed for moderate pain.   triamcinolone cream (KENALOG) 0.1 % Apply 1 Application topically 2 (two) times daily.   valACYclovir (VALTREX) 1000 MG tablet Take 1 tablet (1,000 mg total) by mouth 3 (three) times daily. Take for 7 days  VITAMIN D PO Take 10,000 Units by mouth daily.   Zinc Oxide (DESITIN) 40 % PSTE Apply 1 Application topically 3 (three) times daily as needed.   buPROPion (WELLBUTRIN XL) 300 MG 24 hr tablet Take 300 mg by mouth every morning.   FLUoxetine (PROZAC) 20 MG capsule Take 60 mg by mouth daily.   fluticasone (FLONASE) 50 MCG/ACT nasal spray Place 2 sprays into both nostrils daily.   lisinopril (ZESTRIL) 20 MG tablet Take  1 tablet  Daily for BP                                                            /                                    TAKE                                         BY                                        MOUTH   methocarbamol (ROBAXIN) 500 MG tablet Take 1 tablet (500 mg total) by mouth every 8 (eight) hours as needed for muscle spasms.   methylphenidate (RITALIN LA) 10 MG 24 hr capsule Take 1 capsule (10 mg total) by mouth daily.   montelukast (SINGULAIR) 10 MG tablet Take 10 mg by mouth daily.   rosuvastatin (CRESTOR) 40 MG tablet Take 40 mg by mouth daily.   traMADol (ULTRAM) 50 MG tablet Take 1  tablet (50 mg total) by mouth every 6 (six) hours as needed.   No current facility-administered medications on file prior to visit.    ROS: all negative except what is noted in the HPI.   Physical Exam:  BP 130/74   Pulse 94   Temp 97.6 F (36.4 C)   SpO2 96%   General Appearance: NAD.  Awake, conversant and cooperative. Eyes: Periorbital skin moderately erythematous, moderate edema.  Sclera red.  PERRLA, EOMs intact.  Sinuses: No frontal/maxillary tenderness.  No nasal discharge. Nares patent.  ENT/Mouth: Ext aud canals clear.  Bilateral TMs w/DOL and without erythema or bulging. Hearing intact.  Posterior pharynx without swelling or exudate.  Tonsils without swelling or erythema.  Neck: Supple.  No masses, nodules or thyromegaly. Respiratory: Effort is regular with non-labored breathing. Breath sounds are equal bilaterally without rales, rhonchi, wheezing or stridor.  Cardio: RRR with no MRGs. Brisk peripheral pulses without edema.  Abdomen: Active BS in all four quadrants.  Soft and non-tender without guarding, rebound tenderness, hernias or masses. Lymphatics: Non tender without lymphadenopathy.  Musculoskeletal: Full ROM, 5/5 strength, normal ambulation.  No clubbing or cyanosis. Skin: Appropriate color for ethnicity. Warm without rashes, lesions, ecchymosis, ulcers.  Neuro: CN II-XII grossly normal. Normal muscle tone without cerebellar symptoms and intact sensation.   Psych: AO X 3,  appropriate mood and affect, insight and judgment.     Adela Glimpse, NP 2:55 PM [facility]

## 2023-07-17 DIAGNOSIS — Z9181 History of falling: Secondary | ICD-10-CM | POA: Diagnosis not present

## 2023-07-17 DIAGNOSIS — I10 Essential (primary) hypertension: Secondary | ICD-10-CM | POA: Diagnosis not present

## 2023-07-17 DIAGNOSIS — M6281 Muscle weakness (generalized): Secondary | ICD-10-CM | POA: Diagnosis not present

## 2023-07-17 DIAGNOSIS — F419 Anxiety disorder, unspecified: Secondary | ICD-10-CM | POA: Diagnosis not present

## 2023-07-17 DIAGNOSIS — J449 Chronic obstructive pulmonary disease, unspecified: Secondary | ICD-10-CM | POA: Diagnosis not present

## 2023-07-17 DIAGNOSIS — S82852D Displaced trimalleolar fracture of left lower leg, subsequent encounter for closed fracture with routine healing: Secondary | ICD-10-CM | POA: Diagnosis not present

## 2023-07-17 DIAGNOSIS — K219 Gastro-esophageal reflux disease without esophagitis: Secondary | ICD-10-CM | POA: Diagnosis not present

## 2023-07-17 DIAGNOSIS — Z79891 Long term (current) use of opiate analgesic: Secondary | ICD-10-CM | POA: Diagnosis not present

## 2023-07-17 DIAGNOSIS — E785 Hyperlipidemia, unspecified: Secondary | ICD-10-CM | POA: Diagnosis not present

## 2023-07-17 DIAGNOSIS — W19XXXD Unspecified fall, subsequent encounter: Secondary | ICD-10-CM | POA: Diagnosis not present

## 2023-07-17 DIAGNOSIS — F32A Depression, unspecified: Secondary | ICD-10-CM | POA: Diagnosis not present

## 2023-07-18 DIAGNOSIS — J449 Chronic obstructive pulmonary disease, unspecified: Secondary | ICD-10-CM | POA: Diagnosis not present

## 2023-07-18 DIAGNOSIS — Z9181 History of falling: Secondary | ICD-10-CM | POA: Diagnosis not present

## 2023-07-18 DIAGNOSIS — I10 Essential (primary) hypertension: Secondary | ICD-10-CM | POA: Diagnosis not present

## 2023-07-18 DIAGNOSIS — K219 Gastro-esophageal reflux disease without esophagitis: Secondary | ICD-10-CM | POA: Diagnosis not present

## 2023-07-18 DIAGNOSIS — F419 Anxiety disorder, unspecified: Secondary | ICD-10-CM | POA: Diagnosis not present

## 2023-07-18 DIAGNOSIS — Z79891 Long term (current) use of opiate analgesic: Secondary | ICD-10-CM | POA: Diagnosis not present

## 2023-07-18 DIAGNOSIS — S82852D Displaced trimalleolar fracture of left lower leg, subsequent encounter for closed fracture with routine healing: Secondary | ICD-10-CM | POA: Diagnosis not present

## 2023-07-18 DIAGNOSIS — F32A Depression, unspecified: Secondary | ICD-10-CM | POA: Diagnosis not present

## 2023-07-18 DIAGNOSIS — W19XXXD Unspecified fall, subsequent encounter: Secondary | ICD-10-CM | POA: Diagnosis not present

## 2023-07-18 DIAGNOSIS — M6281 Muscle weakness (generalized): Secondary | ICD-10-CM | POA: Diagnosis not present

## 2023-07-18 DIAGNOSIS — E785 Hyperlipidemia, unspecified: Secondary | ICD-10-CM | POA: Diagnosis not present

## 2023-07-18 MED ORDER — BACITRACIN-POLYMYXIN B 500-10000 UNIT/GM OP OINT
TOPICAL_OINTMENT | Freq: Two times a day (BID) | OPHTHALMIC | 0 refills | Status: DC
Start: 2023-07-18 — End: 2023-07-28

## 2023-07-19 ENCOUNTER — Other Ambulatory Visit: Payer: Self-pay | Admitting: Internal Medicine

## 2023-07-19 MED ORDER — NEOMYCIN-POLYMYXIN-DEXAMETH 3.5-10000-0.1 OP OINT: TOPICAL_OINTMENT | OPHTHALMIC | 2 refills | Status: DC

## 2023-07-20 LAB — POC COVID19 BINAXNOW: SARS Coronavirus 2 Ag: NEGATIVE

## 2023-07-20 NOTE — Patient Instructions (Signed)
Fluconazole Tablets What is this medication? FLUCONAZOLE (floo KON na zole) prevents and treats fungal or yeast infections. It belongs to a group of medications called antifungals. It will not prevent or treat colds, the flu, or infections caused by bacteria or viruses. This medicine may be used for other purposes; ask your health care provider or pharmacist if you have questions. COMMON BRAND NAME(S): Diflucan What should I tell my care team before I take this medication? They need to know if you have any of these conditions: Irregular heartbeat or rhythm Kidney disease Liver disease Low levels of potassium in the blood An unusual or allergic reaction to fluconazole, other medications, foods, dyes, or preservatives Pregnant or trying to get pregnant Breastfeeding How should I use this medication? Take this medication by mouth. Take it as directed on the prescription label at the same time every day. You can take it with or without food. If it upsets your stomach, take it with food. Take all of this medication unless your care team tells you to stop it early. Keep taking it even if you think you are better. Talk to your care team about the use of this medication in children. While it may be prescribed for children as young as newborns for selected conditions, precautions do apply. Overdosage: If you think you have taken too much of this medicine contact a poison control center or emergency room at once. NOTE: This medicine is only for you. Do not share this medicine with others. What if I miss a dose? If you miss a dose, take it as soon as you can. If it is almost time for your next dose, take only that dose. Do not take double or extra doses. What may interact with this medication? Do not take this medication with any of the following: Adagrasib Flibanserin Lomitapide Lonafarnib Other medications that cause heart rhythm changes Triazolam This medication may also interact with the  following: Abrocitinib Certain antibiotics, such as rifabutin or rifampin Certain antivirals for HIV or hepatitis Certain medications for blood pressure, heart disease, irregular heartbeat Certain medications for cholesterol, such as atorvastatin, lovastatin, simvastatin Certain medications for depression, such as amitriptyline or nortriptyline Certain medications for diabetes, such as glipizide or glyburide Certain medications for seizures, such as carbamazepine or phenytoin Certain medications that treat or prevent blood clots, such as warfarin Certain opioid medications for pain, such as alfentanil, fentanyl, methadone Cyclophosphamide Cyclosporine Ibrutinib Lemborexant Midazolam NSAIDS, medications for pain and inflammation, such as ibuprofen or naproxen Olaparib Sirolimus Steroid medications, such as prednisone Tacrolimus Theophylline Tofacitinib Tolvaptan Vinblastine Vincristine Vitamin A Voriconazole This list may not describe all possible interactions. Give your health care provider a list of all the medicines, herbs, non-prescription drugs, or dietary supplements you use. Also tell them if you smoke, drink alcohol, or use illegal drugs. Some items may interact with your medicine. What should I watch for while using this medication? Visit your care team for regular checkups. If you are taking this medication for a long time you may need blood work. Tell your care team if your symptoms do not improve. Some fungal infections need many weeks or months of treatment to cure. Alcohol can increase possible damage to your liver. Avoid alcoholic drinks. If you have a vaginal infection, do not have sex until you have finished your treatment. You can wear a sanitary napkin. Do not use tampons. Wear freshly washed cotton, not synthetic, panties. What side effects may I notice from receiving this medication? Side effects that  you should report to your care team as soon as  possible: Allergic reactions--skin rash, itching, hives, swelling of the face, lips, tongue, or throat Heart rhythm changes--fast or irregular heartbeat, dizziness, feeling faint or lightheaded, chest pain, trouble breathing Liver injury--right upper belly pain, loss of appetite, nausea, light-colored stool, dark yellow or brown urine, yellowing skin or eyes, unusual weakness or fatigue Low adrenal gland function--nausea, vomiting, loss of appetite, unusual weakness or fatigue, dizziness Rash, fever, and swollen lymph nodes Redness, blistering, peeling, or loosening of the skin, including inside the mouth Seizures Side effects that usually do not require medical attention (report to your care team if they continue or are bothersome): Change in taste Diarrhea Dizziness Headache Nausea Stomach pain This list may not describe all possible side effects. Call your doctor for medical advice about side effects. You may report side effects to FDA at 1-800-FDA-1088. Where should I keep my medication? Keep out of the reach of children. Store at room temperature below 30 degrees C (86 degrees F). Throw away any medication after the expiration date. NOTE: This sheet is a summary. It may not cover all possible information. If you have questions about this medicine, talk to your doctor, pharmacist, or health care provider.  2024 [employer] (2022-12-25 00:00:00)

## 2023-07-21 ENCOUNTER — Other Ambulatory Visit: Payer: Self-pay | Admitting: Nurse Practitioner

## 2023-07-21 DIAGNOSIS — H019 Unspecified inflammation of eyelid: Secondary | ICD-10-CM

## 2023-07-21 DIAGNOSIS — B9689 Other specified bacterial agents as the cause of diseases classified elsewhere: Secondary | ICD-10-CM

## 2023-07-21 DIAGNOSIS — B372 Candidiasis of skin and nail: Secondary | ICD-10-CM

## 2023-07-22 DIAGNOSIS — S82852D Displaced trimalleolar fracture of left lower leg, subsequent encounter for closed fracture with routine healing: Secondary | ICD-10-CM | POA: Diagnosis not present

## 2023-07-22 DIAGNOSIS — Z4889 Encounter for other specified surgical aftercare: Secondary | ICD-10-CM | POA: Diagnosis not present

## 2023-07-23 DIAGNOSIS — Z79891 Long term (current) use of opiate analgesic: Secondary | ICD-10-CM | POA: Diagnosis not present

## 2023-07-23 DIAGNOSIS — I10 Essential (primary) hypertension: Secondary | ICD-10-CM | POA: Diagnosis not present

## 2023-07-23 DIAGNOSIS — E785 Hyperlipidemia, unspecified: Secondary | ICD-10-CM | POA: Diagnosis not present

## 2023-07-23 DIAGNOSIS — K219 Gastro-esophageal reflux disease without esophagitis: Secondary | ICD-10-CM | POA: Diagnosis not present

## 2023-07-23 DIAGNOSIS — W19XXXD Unspecified fall, subsequent encounter: Secondary | ICD-10-CM | POA: Diagnosis not present

## 2023-07-23 DIAGNOSIS — M5412 Radiculopathy, cervical region: Secondary | ICD-10-CM | POA: Diagnosis not present

## 2023-07-23 DIAGNOSIS — F32A Depression, unspecified: Secondary | ICD-10-CM | POA: Diagnosis not present

## 2023-07-23 DIAGNOSIS — Z9181 History of falling: Secondary | ICD-10-CM | POA: Diagnosis not present

## 2023-07-23 DIAGNOSIS — F419 Anxiety disorder, unspecified: Secondary | ICD-10-CM | POA: Diagnosis not present

## 2023-07-23 DIAGNOSIS — J449 Chronic obstructive pulmonary disease, unspecified: Secondary | ICD-10-CM | POA: Diagnosis not present

## 2023-07-23 DIAGNOSIS — M6281 Muscle weakness (generalized): Secondary | ICD-10-CM | POA: Diagnosis not present

## 2023-07-23 DIAGNOSIS — S82852D Displaced trimalleolar fracture of left lower leg, subsequent encounter for closed fracture with routine healing: Secondary | ICD-10-CM | POA: Diagnosis not present

## 2023-07-24 ENCOUNTER — Encounter: Payer: Self-pay | Admitting: Nurse Practitioner

## 2023-07-24 ENCOUNTER — Ambulatory Visit: Payer: Medicare HMO | Admitting: Nurse Practitioner

## 2023-07-24 VITALS — BP 140/62 | HR 106 | Temp 97.5°F | Ht 64.0 in

## 2023-07-24 DIAGNOSIS — J4522 Mild intermittent asthma with status asthmaticus: Secondary | ICD-10-CM | POA: Diagnosis not present

## 2023-07-24 DIAGNOSIS — E559 Vitamin D deficiency, unspecified: Secondary | ICD-10-CM

## 2023-07-24 DIAGNOSIS — J3089 Other allergic rhinitis: Secondary | ICD-10-CM | POA: Diagnosis not present

## 2023-07-24 DIAGNOSIS — Z79899 Other long term (current) drug therapy: Secondary | ICD-10-CM | POA: Diagnosis not present

## 2023-07-24 DIAGNOSIS — S82852D Displaced trimalleolar fracture of left lower leg, subsequent encounter for closed fracture with routine healing: Secondary | ICD-10-CM

## 2023-07-24 DIAGNOSIS — G8929 Other chronic pain: Secondary | ICD-10-CM

## 2023-07-24 DIAGNOSIS — I10 Essential (primary) hypertension: Secondary | ICD-10-CM

## 2023-07-24 DIAGNOSIS — E782 Mixed hyperlipidemia: Secondary | ICD-10-CM | POA: Diagnosis not present

## 2023-07-24 DIAGNOSIS — H1131 Conjunctival hemorrhage, right eye: Secondary | ICD-10-CM | POA: Diagnosis not present

## 2023-07-24 DIAGNOSIS — Z961 Presence of intraocular lens: Secondary | ICD-10-CM | POA: Diagnosis not present

## 2023-07-24 DIAGNOSIS — M545 Low back pain, unspecified: Secondary | ICD-10-CM

## 2023-07-24 DIAGNOSIS — M8589 Other specified disorders of bone density and structure, multiple sites: Secondary | ICD-10-CM

## 2023-07-24 DIAGNOSIS — F419 Anxiety disorder, unspecified: Secondary | ICD-10-CM

## 2023-07-24 DIAGNOSIS — R7309 Other abnormal glucose: Secondary | ICD-10-CM | POA: Diagnosis not present

## 2023-07-24 DIAGNOSIS — H1045 Other chronic allergic conjunctivitis: Secondary | ICD-10-CM | POA: Diagnosis not present

## 2023-07-24 DIAGNOSIS — Z1231 Encounter for screening mammogram for malignant neoplasm of breast: Secondary | ICD-10-CM

## 2023-07-24 DIAGNOSIS — Z0001 Encounter for general adult medical examination with abnormal findings: Secondary | ICD-10-CM | POA: Diagnosis not present

## 2023-07-24 DIAGNOSIS — K219 Gastro-esophageal reflux disease without esophagitis: Secondary | ICD-10-CM

## 2023-07-24 DIAGNOSIS — Z0000 Encounter for general adult medical examination without abnormal findings: Secondary | ICD-10-CM

## 2023-07-24 DIAGNOSIS — R6889 Other general symptoms and signs: Secondary | ICD-10-CM

## 2023-07-24 DIAGNOSIS — Z1211 Encounter for screening for malignant neoplasm of colon: Secondary | ICD-10-CM

## 2023-07-24 NOTE — Progress Notes (Signed)
MEDICARE ANNUAL WELLNESS VISIT AND FOLLOW UP  Assessment:   Encounter for Medicare annual wellness exam Due annually  Health maintenance reviewed Healthily lifestyle goals set  Essential hypertension Well controlled with current medications Lisinopril Discussed DASH (Dietary Approaches to Stop Hypertension) DASH diet is lower in sodium than a typical American diet. Cut back on foods that are high in saturated fat, cholesterol, and trans fats. Eat more whole-grain foods, fish, poultry, and nuts Remain active and exercise as tolerated daily.  Monitor BP at home-Call if greater than 130/80.   Hyperlipidemia, mixed Continue Rosuvastatin  Discussed lifestyle modifications. Recommended diet heavy in fruits and veggies, omega 3's. Decrease consumption of animal meats, cheeses, and dairy products. Remain active and exercise as tolerated. Continue to monitor.  Abnormal glucose Education: Reviewed 'ABCs' of diabetes management  Discussed goals to be met and/or maintained include A1C (<7) Blood pressure (<130/80) Cholesterol (LDL <70) Continue Eye Exam yearly  Continue Dental Exam Q6 mo Discussed dietary recommendations Discussed Physical Activity recommendations  Vitamin D deficiency Continue supplement for goal of 60-100 Monitor Vitamin D levels  Non-seasonal allergic rhinitis due to other allergic trigger Daily antihistamine Xyzal  Continue Flonase Avoid triggers  Mild intermittent asthma with status asthmaticus Controlled Continue Albuterol and Symbicort, Montelukast Avoid triggers  Medication management All medications discussed and reviewed in full. All questions and concerns regarding medications addressed.    Chronic bilateral low back pain without sciatica Rest when flared Methocarbamol PRN Continue to monitor  Anxiety Continue medications Bupropion, Fluoxetine  Reviewed relaxation techniques.  Sleep hygiene. Recommended Cognitive Behavioral Therapy (CBT)  PRN. Recommended mindfulness meditation and exercise.   Encouraged personality growth wand development through coping techniques and problem-solving skills. Limit/Decrease/Monitor drug/alcohol intake.    Gastroesophageal reflux disease without esophagitis No suspected reflux complications (Barret/stricture). Lifestyle modification:  wt loss, avoid meals 2-3h before bedtime. Consider eliminating food triggers:  chocolate, caffeine, EtOH, acid/spicy food.  Closed trimalleolar fracture of left ankle with routine healing, subsequent encounter Orthopedics following Continue to monitor  Screening for colon cancer Refer to GI  Screening for breast cancer Mammogram due  Osteopenia Due for DEXA - last 02/2020 T score -2.0 Pursue a combination of weight-bearing exercises and strength training. Patients with severe mobility impairment should be referred for physical therapy. Advised on fall prevention measures including proper lighting in all rooms, removal of area rugs and floor clutter, use of walking devices as deemed appropriate, avoidance of uneven walking surfaces. Smoking cessation and moderate alcohol consumption if applicable Consume 800 to 1000 IU of vitamin D daily with a goal vitamin D serum value of 30 ng/mL or higher. Aim for 1000 to 1200 mg of elemental calcium daily through supplements and/or dietary sources.   Blood work recently completed and review is overall stable.    Notify office for further evaluation and treatment, questions or concerns if any reported s/s fail to improve.   The patient was advised to call back or seek an in-person evaluation if any symptoms worsen or if the condition fails to improve as anticipated.   Further disposition pending results of labs. Discussed med's effects and SE's.    I discussed the assessment and treatment plan with the patient. The patient was provided an opportunity to ask questions and all were answered. The patient agreed with  the plan and demonstrated an understanding of the instructions.  Discussed med's effects and SE's. Screening labs and tests as requested with regular follow-up as recommended.  I provided 40 minutes of face-to-face time  during this encounter including counseling, chart review, and critical decision making was preformed.  Today's Plan of Care is based on a patient-centered health care approach known as shared decision making - the decisions, tests and treatments allow for patient preferences and values to be balanced with clinical evidence.    Future Appointments  Date Time Provider Department Center  08/11/2023  3:30 PM Dohmeier, Porfirio Mylar, MD [facility] None  10/24/2023 10:30 AM Adela Glimpse, NP [facility] None  01/22/2024 11:00 AM Lucky Cowboy, MD [facility] None  07/23/2024 10:30 AM Adela Glimpse, NP [facility] None     Plan:   During the course of the visit the patient was educated and counseled about appropriate screening and preventive services including:   Pneumococcal vaccine  Prevnar 13 Influenza vaccine Td vaccine Screening electrocardiogram Bone densitometry screening Colorectal cancer screening Diabetes screening Glaucoma screening Nutrition counseling  Advanced directives: requested   Subjective:  Haley White is a 67 y.o. female who presents alongside daughter today for Medicare Annual Wellness Visit and 3 month follow up. She has Hypertension; Anxiety; Abnormal glucose; Class 2 severe obesity due to excess calories with serious comorbidity and body mass index (BMI) of 37.0 to 37.9 in adult [facility]); Vitamin D deficiency; Hyperlipidemia, mixed; Suicide attempt by drug ingestion (HCC); Steroid-induced depression; Major depressive disorder, recurrent, severe without psychotic features (HCC); Pseudogout; Asthma; Low back pain; Lumbosacral spondylosis without myelopathy; Aortic atherosclerosis (HCC) by Chest CT scan on 02/02/2018 ; Allergic rhinitis; Gastro-esophageal reflux  disease without esophagitis; Mild persistent asthma, uncomplicated; Acute pulmonary embolism (HCC); Sleep-related hypoxia; At risk for obstructive sleep apnea; Trimalleolar fracture of left ankle; Acute left ankle pain; and Ankle fracture on their problem list.  Overall she reports feeling well.    She presented to the ED on 06/02/23 with acute left trimalleolar ankle fracture after tripping while ambulating at home, resulting in the inability to bear weight.  She had an ORIF with external fixation on 06/04/23.  She is continuing to follow with Orthopedics and using a scooter to move around.  She is in a support boot.  She takes a muscle relaxer for improvement of chronic low back pain.  Prior to that she was seen in the ER 04/14/23 for a bee sting reaction. Was concerned for left upper arm swelling and cellulitis. She has completed treatment and now resolved.  Has Epi Pen on hand. She has a hx of asthma and allergies.  Currently well controlled with inhalers and oral agents, Albuterol, Symbicort, Montelukast, Xyzal, Flonase.    She is continuing to take Bupropion and Fluoxetine for management of anxiety. Reports effective. Mood is stable.      BMI is Body mass index is 48.06 kg/m., she has been working on diet and exercise. Wt Readings from Last 3 Encounters:  07/12/23 280 lb (127 kg)  06/13/23 230 lb 6.1 oz (104.5 kg)  05/16/23 222 lb (100.7 kg)    Her blood pressure has been controlled at home, today their BP is BP: (!) 140/62  She is currently on Lisinopril. She does not workout. She denies chest pain, shortness of breath, dizziness.   She is on cholesterol medication Rosuvastatin and denies myalgias. Her cholesterol is at goal. The cholesterol last visit was:   Lab Results  Component Value Date   CHOL 172 01/22/2023   HDL 88 01/22/2023   LDLCALC 65 01/22/2023   TRIG 108 01/22/2023   CHOLHDL 2.0 01/22/2023   She has not been working on diet and exercise for any prediabetes,  and denies  polydipsia and polyuria. Last A1C in the office was:  Lab Results  Component Value Date   HGBA1C 5.8 (H) 04/17/2023   Last GFR: Lab Results  Component Value Date   EGFR 76 04/17/2023   Patient is on Vitamin D supplement.   Lab Results  Component Value Date   VD25OH 80.96 06/02/2023      Medication Review: Current Outpatient Medications on File Prior to Visit  Medication Sig Dispense Refill   albuterol (VENTOLIN HFA) 108 (90 Base) MCG/ACT inhaler Inhale 2 puffs into the lungs every 4 (four) hours as needed for wheezing or shortness of breath. 1 Inhaler 0   budesonide (PULMICORT) 0.5 MG/2ML nebulizer solution Take 0.5 mg by nebulization daily as needed.     budesonide-formoterol (SYMBICORT) 160-4.5 MCG/ACT inhaler Inhale 2 puffs into the lungs daily as needed (wheezing).     buPROPion (WELLBUTRIN XL) 300 MG 24 hr tablet Take 300 mg by mouth daily.     docusate sodium (COLACE) 100 MG capsule Take 1 capsule (100 mg total) by mouth 2 (two) times daily.     EPINEPHrine 0.3 mg/0.3 mL IJ SOAJ injection See admin instructions.     fexofenadine (ALLEGRA) 180 MG tablet Take 180 mg by mouth daily as needed for allergies.     FLUoxetine (PROZAC) 20 MG capsule Take 60 mg by mouth daily.     fluticasone (FLONASE) 50 MCG/ACT nasal spray Place 1 spray into both nostrils daily as needed for allergies or rhinitis.     hydrOXYzine (ATARAX) 25 MG tablet Take 1 tablet (25 mg total) by mouth every 6 (six) hours. As needed for itching 60 tablet 0   levocetirizine (XYZAL) 5 MG tablet Take 5 mg by mouth every evening.  3   lisinopril (ZESTRIL) 20 MG tablet Take 20 mg by mouth daily.     methocarbamol (ROBAXIN) 500 MG tablet Take 1 tablet (500 mg total) by mouth every 8 (eight) hours as needed for muscle spasms. 20 tablet 0   methylphenidate (RITALIN) 10 MG tablet Take 10 mg by mouth 2 (two) times daily.  0   montelukast (SINGULAIR) 10 MG tablet Take 10 mg by mouth at bedtime.     neomycin-polymyxin  b-dexamethasone (MAXITROL) 3.5-10000-0.1 OINT Apply a small amount into the lower eyelid 3 to 4 x / day 3.5 g 2   nystatin (MYCOSTATIN/NYSTOP) powder Apply 1 Application topically 3 (three) times daily. To groin rash 15 g 0   olopatadine (PATADAY) 0.1 % ophthalmic solution Place 1 drop into both eyes 2 (two) times daily. 5 mL 0   oxyCODONE (OXY IR/ROXICODONE) 5 MG immediate release tablet Take 1-2 tablets (5-10 mg total) by mouth every 4 (four) hours as needed for moderate pain, severe pain or breakthrough pain. 30 tablet 0   pantoprazole (PROTONIX) 40 MG tablet TAKE 1 TABLET BY MOUTH DAILY TO PREVENT INDIGESTION AND HEARTBURN 90 tablet 3   polyethylene glycol (MIRALAX / GLYCOLAX) 17 g packet Take 17 g by mouth 2 (two) times daily.     rosuvastatin (CRESTOR) 40 MG tablet Take 40 mg by mouth daily.     SYMBICORT 160-4.5 MCG/ACT inhaler Inhale 2 puffs into the lungs 2 (two) times daily.  6   valACYclovir (VALTREX) 1000 MG tablet Take 1 tablet (1,000 mg total) by mouth 3 (three) times daily. Take for 7 days 21 tablet 0   VITAMIN D PO Take 10,000 Units by mouth daily.     albuterol (VENTOLIN HFA) 108 (90 Base)  MCG/ACT inhaler Inhale 2 puffs into the lungs every 6 (six) hours as needed for wheezing or shortness of breath.     cephALEXin (KEFLEX) 500 MG capsule Take 1 capsule (500 mg total) by mouth 4 (four) times daily. (Patient not taking: Reported on 07/24/2023) 20 capsule 0   cholecalciferol (VITAMIN D3) 25 MCG (1000 UNIT) tablet Take 1,000 Units by mouth daily. (Patient not taking: Reported on 07/24/2023)     NON FORMULARY Inject 1 Dose into the skin once a week. Allergy shots from [facility] at [place]. Patient not sure about the name of medicine (Patient not taking: Reported on 07/24/2023)     traMADol (ULTRAM) 50 MG tablet Take 1 tablet (50 mg total) by mouth every 8 (eight) hours as needed for moderate pain. (Patient not taking: Reported on 07/24/2023) 30 tablet 0   triamcinolone cream  (KENALOG) 0.1 % Apply 1 Application topically 2 (two) times daily. (Patient not taking: Reported on 07/24/2023) 30 g 0   Zinc Oxide (DESITIN) 40 % PSTE Apply 1 Application topically 3 (three) times daily as needed. (Patient not taking: Reported on 07/24/2023) 99 g 0   No current facility-administered medications on file prior to visit.    Allergies  Allergen Reactions   Molds & Smuts Anaphylaxis   Biaxin [Clarithromycin]     GI upset   Biaxin [Clarithromycin] Other (See Comments)    GI Upset   Prednisone     Was admitted for self harm when on prednisone before   Prednisone Other (See Comments)    Suicidal   Serevent [Salmeterol]     Palpitations   Tequin [Gatifloxacin]     GI upset. thrush   Tequin [Gatifloxacin] Other (See Comments)    GI upset, Thrush   Serevent [Salmeterol] Palpitations    Current Problems (verified) Patient Active Problem List   Diagnosis Date Noted   Ankle fracture 06/03/2023   Trimalleolar fracture of left ankle 06/02/2023   Acute left ankle pain 06/02/2023   Sleep-related hypoxia 03/24/2023   At risk for obstructive sleep apnea 03/24/2023   Acute pulmonary embolism (HCC) 05/10/2022   Allergic rhinitis 03/15/2021   Gastro-esophageal reflux disease without esophagitis 03/15/2021   Mild persistent asthma, uncomplicated 03/15/2021   Aortic atherosclerosis (HCC) by Chest CT scan on 02/02/2018  01/10/2021   Asthma    Low back pain 03/17/2019   Lumbosacral spondylosis without myelopathy 03/17/2019   Pseudogout 12/09/2017   Major depressive disorder, recurrent, severe without psychotic features (HCC)    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   Suicide attempt by drug ingestion (HCC)    Vitamin D deficiency 08/22/2014   Hyperlipidemia, mixed 08/22/2014   Hypertension    Anxiety    Abnormal glucose    Class 2 severe obesity due to excess calories with serious comorbidity and body mass index (BMI) of 37.0 to 37.9 in adult [facility])     Screening  Tests Immunization History  Administered Date(s) Administered   Influenza, High Dose Seasonal PF 10/17/2017   Influenza, Quadrivalent, Recombinant, Inj, Pf 08/20/2018   Influenza,inj,Quad PF,6+ Mos 09/17/2017   Influenza-Unspecified 08/20/2018   PFIZER(Purple Top)SARS-COV-2 Vaccination 01/20/2020, 02/10/2020, 03/27/2020   PPD Test 10/08/2017, 11/29/2019, 01/10/2021   Pneumococcal Conjugate-13 11/05/1995   Pneumococcal Polysaccharide-23 10/08/2017, 06/15/2021   Td 11/29/2019   Tdap 11/04/2008, 03/28/2020   Health Maintenance  Topic Date Due   Hepatitis C Screening  Never done   Zoster Vaccines- Shingrix (1 of 2) Never done   MAMMOGRAM  01/13/2022   Colonoscopy  12/26/2022   INFLUENZA VACCINE  06/05/2023   COVID-19 Vaccine (4 - 2023-24 season) 07/06/2023   Medicare Annual Wellness (AWV)  07/23/2024   Pneumonia Vaccine 79+ Years old (3 of 3 - PPSV23 or PCV20) 06/15/2026   DTaP/Tdap/Td (4 - Td or Tdap) 03/28/2030   DEXA SCAN  Completed   HPV VACCINES  Aged Out    Last colonoscopy: 12/2019 Recall 3 Yeas - Due Mammograms: 01/2020 Due  Last pap smear/pelvic exam: 11/2019 Follow GYN   DEXA: 02/2020 Due T Score -2.0 Osteopenia Due   Names of Other Physician/Practitioners you currently use: 1. [facility] here for primary care 2. Dr. Dione Booze , eye doctor, last visit has 1st visit scheduled 07/2023 3. Dr. Alvester Morin, dentist, last visit Q6 mo  Patient Care Team: Lucky Cowboy, MD as PCP - General (Internal Medicine) Hart Carwin, MD (Inactive) as Consulting Physician (Gastroenterology) Milagros Evener, MD as Consulting Physician (Psychiatry) Lucky Cowboy, MD (Internal Medicine)  SURGICAL HISTORY She  has a past surgical history that includes Knee surgery (Left, 1998); Foot surgery (Left, 2002); Appendectomy (1984); Cataract extraction w/ intraocular lens  implant, bilateral (2014); Cesarean section (1984, 1987, 1994); Tubal ligation (1994); Rotator  cuff repair (Right, 2007); Colonoscopy (11/16/2013); Tubal ligation; Cesarean section; Ankle surgery (Left); Eye surgery (Bilateral); Fracture surgery (Left); shoulder; Appendectomy; Foot fracture surgery (Left); ORIF ankle fracture (Left, 06/04/2023); and External fixation leg (Left, 06/04/2023). FAMILY HISTORY Her family history includes Anxiety disorder in her sister; Dementia in her father. SOCIAL HISTORY She  reports that she has never smoked. She has never used smokeless tobacco. She reports that she does not currently use alcohol. She reports that she does not use drugs.   MEDICARE WELLNESS OBJECTIVES: Physical activity:   Cardiac risk factors:   Depression/mood screen:      07/24/2023   11:12 AM  Depression screen PHQ 2/9  Decreased Interest 0  Down, Depressed, Hopeless 0  PHQ - 2 Score 0    ADLs:     07/24/2023   11:13 AM 06/04/2023    4:50 PM  In your present state of health, do you have any difficulty performing the following activities:  Hearing? 0 0  Vision? 1 0  Difficulty concentrating or making decisions? 0 0  Walking or climbing stairs? 1 0  Comment Left broken ankle, boot   Dressing or bathing? 0 0  Doing errands, shopping? 0      Cognitive Testing  Alert? Yes  Normal Appearance?Yes  Oriented to person? Yes  Place? Yes   Time? Yes  Recall of three objects?  Yes  Can perform simple calculations? Yes  Displays appropriate judgment?Yes  Can read the correct time from a watch face?Yes  EOL planning:    Review of Systems  Constitutional: Negative.   HENT: Negative.    Eyes:  Positive for pain, discharge and redness.  Respiratory: Negative.    Cardiovascular: Negative.   Gastrointestinal:  Positive for heartburn.  Genitourinary: Negative.   Musculoskeletal:  Positive for back pain, falls and joint pain.       Left foot pain  Skin:  Positive for rash.  Endo/Heme/Allergies: Negative.   Psychiatric/Behavioral:  Positive for depression. The patient is  nervous/anxious.      Objective:     Today's Vitals   07/24/23 1022  BP: (!) 140/62  Pulse: (!) 106  Temp: (!) 97.5 F (36.4 C)  SpO2: 96%  Height: 5\' 4"  (1.626 m)   Body mass index is  48.06 kg/m.  General appearance: Knee scooter, alert, no distress, WD/WN, female HEENT: normocephalic, sclerae anicteric, TMs pearly, nares patent, no discharge or erythema, pharynx normal Oral cavity: MMM, no lesions Neck: supple, no lymphadenopathy, no thyromegaly, no masses Heart: RRR, normal S1, S2, no murmurs Lungs: CTA bilaterally, no wheezes, rhonchi, or rales Abdomen: +bs, soft, non tender, non distended, no masses, no hepatomegaly, no splenomegaly Musculoskeletal: Left orthopdic boot; RLE nontender, no swelling, no obvious deformity Extremities: no edema, no cyanosis, no clubbing Pulses: 2+ symmetric, upper and lower extremities, normal cap refill Neurological: alert, oriented x 3, CN2-12 intact, strength normal upper extremities and lower extremities, sensation normal throughout, DTRs 2+ throughout, no cerebellar signs, gait normal Psychiatric: normal affect, behavior normal, pleasant   Medicare Attestation I have personally reviewed: The patient's medical and social history Their use of alcohol, tobacco or illicit drugs Their current medications and supplements The patient's functional ability including ADLs,fall risks, home safety risks, cognitive, and hearing and visual impairment Diet and physical activities Evidence for depression or mood disorders  The patient's weight, height, BMI, and visual acuity have been recorded in the chart.  I have made referrals, counseling, and provided education to the patient based on review of the above and I have provided the patient with a written personalized care plan for preventive services.     Adela Glimpse, NP   07/31/2023

## 2023-07-28 DIAGNOSIS — E785 Hyperlipidemia, unspecified: Secondary | ICD-10-CM | POA: Diagnosis not present

## 2023-07-28 DIAGNOSIS — M6281 Muscle weakness (generalized): Secondary | ICD-10-CM | POA: Diagnosis not present

## 2023-07-28 DIAGNOSIS — I10 Essential (primary) hypertension: Secondary | ICD-10-CM | POA: Diagnosis not present

## 2023-07-28 DIAGNOSIS — S82852D Displaced trimalleolar fracture of left lower leg, subsequent encounter for closed fracture with routine healing: Secondary | ICD-10-CM | POA: Diagnosis not present

## 2023-07-28 DIAGNOSIS — Z79891 Long term (current) use of opiate analgesic: Secondary | ICD-10-CM | POA: Diagnosis not present

## 2023-07-28 DIAGNOSIS — J449 Chronic obstructive pulmonary disease, unspecified: Secondary | ICD-10-CM | POA: Diagnosis not present

## 2023-07-28 DIAGNOSIS — Z9181 History of falling: Secondary | ICD-10-CM | POA: Diagnosis not present

## 2023-07-28 DIAGNOSIS — K219 Gastro-esophageal reflux disease without esophagitis: Secondary | ICD-10-CM | POA: Diagnosis not present

## 2023-07-28 DIAGNOSIS — F419 Anxiety disorder, unspecified: Secondary | ICD-10-CM | POA: Diagnosis not present

## 2023-07-28 DIAGNOSIS — W19XXXD Unspecified fall, subsequent encounter: Secondary | ICD-10-CM | POA: Diagnosis not present

## 2023-07-28 DIAGNOSIS — F32A Depression, unspecified: Secondary | ICD-10-CM | POA: Diagnosis not present

## 2023-07-29 DIAGNOSIS — F331 Major depressive disorder, recurrent, moderate: Secondary | ICD-10-CM | POA: Diagnosis not present

## 2023-07-31 NOTE — Patient Instructions (Signed)

## 2023-08-04 DIAGNOSIS — M6281 Muscle weakness (generalized): Secondary | ICD-10-CM | POA: Diagnosis not present

## 2023-08-04 DIAGNOSIS — Z9181 History of falling: Secondary | ICD-10-CM | POA: Diagnosis not present

## 2023-08-04 DIAGNOSIS — W19XXXD Unspecified fall, subsequent encounter: Secondary | ICD-10-CM | POA: Diagnosis not present

## 2023-08-04 DIAGNOSIS — S82852D Displaced trimalleolar fracture of left lower leg, subsequent encounter for closed fracture with routine healing: Secondary | ICD-10-CM | POA: Diagnosis not present

## 2023-08-04 DIAGNOSIS — E785 Hyperlipidemia, unspecified: Secondary | ICD-10-CM | POA: Diagnosis not present

## 2023-08-04 DIAGNOSIS — J449 Chronic obstructive pulmonary disease, unspecified: Secondary | ICD-10-CM | POA: Diagnosis not present

## 2023-08-04 DIAGNOSIS — I10 Essential (primary) hypertension: Secondary | ICD-10-CM | POA: Diagnosis not present

## 2023-08-04 DIAGNOSIS — K219 Gastro-esophageal reflux disease without esophagitis: Secondary | ICD-10-CM | POA: Diagnosis not present

## 2023-08-04 DIAGNOSIS — Z79891 Long term (current) use of opiate analgesic: Secondary | ICD-10-CM | POA: Diagnosis not present

## 2023-08-04 DIAGNOSIS — F32A Depression, unspecified: Secondary | ICD-10-CM | POA: Diagnosis not present

## 2023-08-04 DIAGNOSIS — F419 Anxiety disorder, unspecified: Secondary | ICD-10-CM | POA: Diagnosis not present

## 2023-08-07 DIAGNOSIS — Z79891 Long term (current) use of opiate analgesic: Secondary | ICD-10-CM | POA: Diagnosis not present

## 2023-08-07 DIAGNOSIS — Z9181 History of falling: Secondary | ICD-10-CM | POA: Diagnosis not present

## 2023-08-07 DIAGNOSIS — S82852D Displaced trimalleolar fracture of left lower leg, subsequent encounter for closed fracture with routine healing: Secondary | ICD-10-CM | POA: Diagnosis not present

## 2023-08-07 DIAGNOSIS — F419 Anxiety disorder, unspecified: Secondary | ICD-10-CM | POA: Diagnosis not present

## 2023-08-07 DIAGNOSIS — K219 Gastro-esophageal reflux disease without esophagitis: Secondary | ICD-10-CM | POA: Diagnosis not present

## 2023-08-07 DIAGNOSIS — M6281 Muscle weakness (generalized): Secondary | ICD-10-CM | POA: Diagnosis not present

## 2023-08-07 DIAGNOSIS — W19XXXD Unspecified fall, subsequent encounter: Secondary | ICD-10-CM | POA: Diagnosis not present

## 2023-08-07 DIAGNOSIS — F32A Depression, unspecified: Secondary | ICD-10-CM | POA: Diagnosis not present

## 2023-08-07 DIAGNOSIS — E785 Hyperlipidemia, unspecified: Secondary | ICD-10-CM | POA: Diagnosis not present

## 2023-08-07 DIAGNOSIS — I10 Essential (primary) hypertension: Secondary | ICD-10-CM | POA: Diagnosis not present

## 2023-08-07 DIAGNOSIS — F331 Major depressive disorder, recurrent, moderate: Secondary | ICD-10-CM | POA: Diagnosis not present

## 2023-08-07 DIAGNOSIS — J449 Chronic obstructive pulmonary disease, unspecified: Secondary | ICD-10-CM | POA: Diagnosis not present

## 2023-08-11 ENCOUNTER — Encounter: Payer: Self-pay | Admitting: Neurology

## 2023-08-11 ENCOUNTER — Ambulatory Visit: Payer: Medicare HMO | Admitting: Neurology

## 2023-08-11 VITALS — BP 134/89 | HR 91 | Ht 62.0 in

## 2023-08-11 DIAGNOSIS — E66812 Obesity, class 2: Secondary | ICD-10-CM | POA: Diagnosis not present

## 2023-08-11 DIAGNOSIS — G4733 Obstructive sleep apnea (adult) (pediatric): Secondary | ICD-10-CM | POA: Insufficient documentation

## 2023-08-11 DIAGNOSIS — N951 Menopausal and female climacteric states: Secondary | ICD-10-CM | POA: Diagnosis not present

## 2023-08-11 DIAGNOSIS — Z6837 Body mass index (BMI) 37.0-37.9, adult: Secondary | ICD-10-CM | POA: Diagnosis not present

## 2023-08-11 DIAGNOSIS — F419 Anxiety disorder, unspecified: Secondary | ICD-10-CM | POA: Diagnosis not present

## 2023-08-11 DIAGNOSIS — F332 Major depressive disorder, recurrent severe without psychotic features: Secondary | ICD-10-CM

## 2023-08-11 MED ORDER — TRAZODONE HCL 50 MG PO TABS: 50.0000 mg | ORAL_TABLET | Freq: Every day | ORAL | 5 refills | Status: DC

## 2023-08-11 NOTE — Progress Notes (Signed)
Provider:  Melvyn Novas, MD  Primary Care Physician:  Haley Cowboy, MD [address] [place] [address]     Referring Provider: Lucky Cowboy, Md [address] [place],  [place] [address]          Chief Complaint according to patient   Patient presents with:     New CPAP, established Patient (Initial Visit)           HISTORY OF PRESENT ILLNESS:  Haley White is a 67 y.o. female patient who is here for revisit 08/11/2023 for  compliance visit with new CPAP.   She underwent HST on 04-16-2023: Calculated pAHI (per hour):   50.8/h                          Between the nadir at 71% with a maximum saturation of 98% with a mean saturation of 90%: 73 minutes the equivalent of 20% of total sleep time.    She was furbished with an autotitration CPAP device and the device is set at factory settings between 5 and 20 cm water pressure with 3 cm EPR and she has compliantly use that at 87%.  Average use of time is 7 hours at night, her residual AHI is higher than expected at 8.8 there are 6 apneas alone per hour and 2.8 hypopneas most of them obstructive.  The 95th percentile pressure is 16 cm water currently but she has a moderately high air leak at the 95th percentile 25 L a minute.  So this is more a problem of mask fit at the patient has noticed that the last provided last seen not to fit her very well.  I wanted to show her an AMARA view, small and only found a FFM Respironics dreamwear mask in S .                             Chief concern according to patient :  03-24-2023 Haley White is a 67 y.o. female patient who is seen upon referral on 03/24/2023 from PCP Dr Haley White for a Sleep consultation.  Chief concern according to patient :  " My watch tells me I don't get enough sleep and I am hypoxic"    "My children and my sister have all told me repeatedly about how badly I snore when sleeping. I have a [employer] that tracts my  sleeping. I just discovered today that it not only tracts my sleeping, but also my blood oxygen level as I sleep. When I saw it for the first time this morning, it gave 80% as my blood oxygen level. I became immediately alarmed, as ever since being diagnosed with Asthma and especially since my bad case of Covid last summer, I have kept tabs on my oxygen levels. A friend of mine has Sleep Apnea and said I really ought to contact my doctor and get a referral for a sleep study. "    Epworth sleepiness score: 14/24.  FSS at 36/ 63 points GDS 3/ 15  points.       Haley White is a 67 y.o. female patient who is seen upon referral on 03/24/2023 from PCP Dr Haley White for a Sleep consultation.  Chief concern according to patient :  " My watch tells me I don't get enough sleep and I am hypoxic"    "  My children and my sister have all told me repeatedly about how badly I snore when sleeping. I have a [employer] that tracts my sleeping. I just discovered today that it not only tracts my sleeping, but also my blood oxygen level as I sleep. When I saw it for the first time this morning, it gave 80% as my blood oxygen level. I became immediately alarmed, as ever since being diagnosed with Asthma and especially since my bad case of Covid last summer, I have kept tabs on my oxygen levels. A friend of mine has Sleep Apnea and said I really ought to contact my doctor and get a referral for a sleep study. 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    She reports non restorative sleep, Nocturia 1-2 no sleep waking headaches but sometimes morning headaches, pain in left foot, back pain with ablation procedure, she was Sleep walking in childhood, left bedroom many times, sleep talking, no thyroid disease but  prediabetes, and seasonal depressive disorder.     Family medical /sleep history: First paternal cousin with sleep apnea. no other family member  on CPAP with OSA, insomnia, sleep walkers.    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 One dog and cat.  Tobacco use: quit at age 38- was smoking  in college -3 years.   ETOH use : 4-6 / week,  Caffeine intake in form of Coffee( 2 cups in AM ) Soda( some times, 3 /week-) Tea ( when eating out. ) no energy drinks Exercise in form of swimming .   Hobbies :walking, hiking- but back and foot pain limited this.   Review of Systems: Out of a complete 14 system review, the patient complains of only the following symptoms, and all other reviewed systems are negative.:     How likely are you to doze in the following situations: 0 = not likely, 1 = slight chance, 2 = moderate chance, 3 = high chance   Sitting and Reading? Watching Television? Sitting inactive in a public place (theater or meeting)? As a passenger in a car for an hour without a break? Lying down in the afternoon when circumstances permit? Sitting and talking to someone? Sitting quietly after lunch without alcohol? In a car, while stopped for a few minutes in traffic?   Total = 15/ 24 points   FSS endorsed at 34/ 63 points.   Social History   Socioeconomic History   Marital status: Divorced    Spouse name: Not on file   Number of children: Not on file   Years of education: Not on file   Highest education level: Not on file  Occupational History   Not on file  Tobacco Use   Smoking status: Never   Smokeless tobacco: Never  Vaping Use   Vaping status: Never Used  Substance and Sexual Activity   Alcohol use: Not Currently   Drug use: Never    Comment: 06/03/2022-gummies   Sexual activity: Yes    Birth control/protection: Post-menopausal, Surgical  Other Topics Concern   Not on file  Social History Narrative   ** Merged History Encounter **       Social Determinants of Health   Financial Resource Strain: Not on file   Food Insecurity: Patient Declined (06/03/2023)   Hunger Vital  Sign    Worried About Programme researcher, broadcasting/film/video in the Last Year: Patient declined    Ran Out of Food in the Last Year: Patient declined  Transportation Needs: No Transportation Needs (06/02/2023)   PRAPARE - Administrator, Civil Service (Medical): No    Lack of Transportation (Non-Medical): No  Physical Activity: Not on file  Stress: Not on file  Social Connections: Not on file    Family History  Problem Relation Age of Onset   Dementia Father    Anxiety disorder Sister    Colon cancer Neg Hx    Colon polyps Neg Hx    Esophageal cancer Neg Hx    Stomach cancer Neg Hx    Rectal cancer Neg Hx     Past Medical History:  Diagnosis Date   Anxiety    Arthritis    Asthma    Cataract    Depression    Gout    Hyperlipidemia    Hypertension    Obese     Past Surgical History:  Procedure Laterality Date   ANKLE SURGERY Left    APPENDECTOMY  1984   APPENDECTOMY     CATARACT EXTRACTION W/ INTRAOCULAR LENS  IMPLANT, BILATERAL  2014   CESAREAN SECTION  1984, 1987, 1994   X3   CESAREAN SECTION     x3   COLONOSCOPY  11/16/2013   w/Brodie    EXTERNAL FIXATION LEG Left 06/04/2023   Procedure: EXTERNAL FIXATION LEG;  Surgeon: Netta Cedars, MD;  Location: [facility];  Service: Orthopedics;  Laterality: Left;   EYE SURGERY Bilateral    lasix   FOOT FRACTURE SURGERY Left    FOOT SURGERY Left 2002   FRACTURE SURGERY Left    left ankle   KNEE SURGERY Left 1998   ORIF ANKLE FRACTURE Left 06/04/2023   Procedure: OPEN REDUCTION INTERNAL FIXATION (ORIF) ANKLE FRACTURE vs EX FIX;  Surgeon: Netta Cedars, MD;  Location: [facility];  Service: Orthopedics;  Laterality: Left;   ROTATOR CUFF REPAIR Right 2007   shoulder     torn rotator cuff   TUBAL LIGATION  1994   TUBAL LIGATION       Current Outpatient Medications on File Prior to Visit  Medication Sig Dispense Refill   albuterol (VENTOLIN HFA) 108 (90 Base)  MCG/ACT inhaler Inhale 2 puffs into the lungs every 6 (six) hours as needed for wheezing or shortness of breath.     budesonide (PULMICORT) 0.5 MG/2ML nebulizer solution Take 0.5 mg by nebulization daily as needed.     buPROPion (WELLBUTRIN XL) 300 MG 24 hr tablet Take 300 mg by mouth daily.     EPINEPHrine 0.3 mg/0.3 mL IJ SOAJ injection See admin instructions.     fexofenadine (ALLEGRA) 180 MG tablet Take 180 mg by mouth daily as needed for allergies.     FLUoxetine (PROZAC) 20 MG capsule Take 60 mg by mouth daily.     fluticasone (FLONASE) 50 MCG/ACT nasal spray Place 1 spray into both nostrils daily as needed for allergies or rhinitis.     levocetirizine (XYZAL) 5 MG tablet Take 5 mg by mouth every evening.  3   lisinopril (ZESTRIL) 20 MG tablet Take 20 mg by mouth daily.     methylphenidate (RITALIN) 10 MG tablet Take 10 mg by mouth 2 (two) times daily.  0   montelukast (SINGULAIR) 10 MG tablet Take 10 mg by mouth at bedtime.     neomycin-polymyxin b-dexamethasone (MAXITROL) 3.5-10000-0.1 OINT  Apply a small amount into the lower eyelid 3 to 4 x / day 3.5 g 2   NON FORMULARY Inject 1 Dose into the skin once a week. Allergy shots from [facility] [place]. Patient not sure about the name of medicine     pantoprazole (PROTONIX) 40 MG tablet TAKE 1 TABLET BY MOUTH DAILY TO PREVENT INDIGESTION AND HEARTBURN 90 tablet 3   rosuvastatin (CRESTOR) 40 MG tablet Take 40 mg by mouth daily.     SYMBICORT 160-4.5 MCG/ACT inhaler Inhale 2 puffs into the lungs 2 (two) times daily.  6   traMADol (ULTRAM) 50 MG tablet Take 1 tablet (50 mg total) by mouth every 8 (eight) hours as needed for moderate pain. 30 tablet 0   VITAMIN D PO Take 10,000 Units by mouth daily.     No current facility-administered medications on file prior to visit.    Allergies  Allergen Reactions   Molds & Smuts Anaphylaxis   Biaxin [Clarithromycin]     GI upset   Biaxin [Clarithromycin] Other (See Comments)    GI Upset    Prednisone     Was admitted for self harm when on prednisone before   Prednisone Other (See Comments)    Suicidal   Serevent [Salmeterol]     Palpitations   Tequin [Gatifloxacin]     GI upset. thrush   Tequin [Gatifloxacin] Other (See Comments)    GI upset, Thrush   Serevent [Salmeterol] Palpitations     DIAGNOSTIC DATA (LABS, IMAGING, TESTING) - I reviewed patient records, labs, notes, testing and imaging myself where available.  Lab Results  Component Value Date   WBC 8.3 07/14/2023   HGB 12.7 07/14/2023   HCT 37.6 07/14/2023   MCV 92.8 07/14/2023   PLT 281 07/14/2023      Component Value Date/Time   NA 144 07/14/2023 0043   NA 144 10/31/2021 1420   K 3.5 07/14/2023 0043   CL 110 07/14/2023 0043   CO2 23 07/14/2023 0043   GLUCOSE 137 (H) 07/14/2023 0043   BUN 14 07/14/2023 0043   BUN 12 10/31/2021 1420   CREATININE 0.79 07/14/2023 0043   CREATININE 0.85 04/17/2023 1523   CALCIUM 9.2 07/14/2023 0043   PROT 5.8 (L) 06/04/2023 0121   ALBUMIN 3.6 06/04/2023 0121   AST 26 06/04/2023 0121   ALT 25 06/04/2023 0121   ALKPHOS 56 06/04/2023 0121   BILITOT 1.3 (H) 06/04/2023 0121   GFRNONAA >60 07/14/2023 0043   GFRNONAA 56 (L) 01/10/2021 0852   GFRAA 65 01/10/2021 0852   Lab Results  Component Value Date   CHOL 172 01/22/2023   HDL 88 01/22/2023   LDLCALC 65 01/22/2023   TRIG 108 01/22/2023   CHOLHDL 2.0 01/22/2023   Lab Results  Component Value Date   HGBA1C 5.8 (H) 04/17/2023   Lab Results  Component Value Date   VITAMINB12 511 01/10/2021   Lab Results  Component Value Date   TSH 0.95 01/22/2023    PHYSICAL EXAM:  Today's Vitals   08/11/23 1543  BP: 134/89  Pulse: 91  Height: 5\' 2"  (1.575 m)   Body mass index is 51.21 kg/m.   Wt Readings from Last 3 Encounters:  07/12/23 280 lb (127 kg)  06/13/23 230 lb 6.1 oz (104.5 kg)  05/16/23 222 lb (100.7 kg)     Ht Readings from Last 3 Encounters:  08/11/23 5\' 2"  (1.575 m)  07/24/23 5\' 4"   (1.626 m)  07/12/23 5\' 4"  (1.626  m)      General: Normocephalic, atraumatic. Neck is supple. Mallampati 3,  neck circumference:15. 75 inches.  Nasal airflow is patent.  Retrognathia is not seen.  Dental status: biological, wore braces and retainer.  Cardiovascular:  Regular rate and cardiac rhythm by pulse,  without distended neck veins. Respiratory: Lungs are clear to auscultation.  Skin:  Without evidence of ankle edema, or rash. Trunk: The patient's posture is erect.   NEUROLOGIC EXAM: The patient is awake and alert, oriented to place and time.   Memory subjective described as intact.  Attention span & concentration ability appears normal.  Speech is fluent,  without  dysarthria, dysphonia or aphasia.  Mood and affect are appropriate.   Cranial nerves: no loss of smell or taste reported  Pupils are equal and briskly reactive to light. Funduscopic exam deferred.  Extraocular movements in vertical and horizontal planes were intact and without nystagmus. No Diplopia. Visual fields by finger perimetry are intact. Hearing was intact to soft voice and finger rubbing.    Facial sensation intact to fine touch.  Facial motor strength is symmetric and tongue and uvula move midline.  Neck ROM : rotation, tilt and flexion extension were normal for age and shoulder shrug was symmetrical.    Motor exam:  Symmetric bulk, tone and ROM.   Normal tone without cog wheeling, symmetric grip strength .   Sensory:  Fine touch, pinprick and vibration were tested - the left ffot was numb to b vibration at ankle level.  Proprioception tested in the upper extremities was normal.   Coordination: Rapid alternating movements in the fingers/hands were of normal speed.  The Finger-to-nose maneuver was intact without evidence of ataxia, dysmetria or tremor.   Gait and station: Patient could rise unassisted from a seated position, walked without assistive device.  Stance is of normal width/ base .  Toe and  heel walk were deferred.  Deep tendon reflexes: in the  upper and lower extremities are symmetric and intact.  Babinski response was deferred.       ASSESSMENT AND PLAN 67 y.o. year old female  here with: her new CPAP, she likes he settings but has higher AHI  (8.8/h) and feels the mask isn't fitting.     1) Habitual Mouthbreather, she wants to use a FFM,  will switch to a dreamwear mask in S FFM from her current model as she had too much air leakage.   2) Her CPAP user compliance is good. Can hopefully still improve  on trazodone and with the new mask.  AHI will improve with less airleak.   3)   Rv with NP in 12 months. Leave settings as they are.  Trazodone refilled.  Mask was refitted here.   DME to refill supplies in the future with respironics dreamwear mask FFM, size s.    I plan to follow up either personally or through our NP within 12 months.   I would like to thank Haley Cowboy, [place] [address] [place],  [place] [address] for allowing me to meet with and to take care of this pleasant patient.    After spending a total time of  32  minutes face to face and additional time for physical and neurologic examination, review of laboratory studies,  personal review of imaging studies, reports and results of other testing and review of referral information / records as far as provided in visit,   Electronically signed by: Haley Novas, MD  08/11/2023 4:08 PM  [facility] and [employer] certified by [employer] of Sleep Medicine and Diplomate of the [employer] of Sleep Medicine. Board certified In Neurology through the ABPN, Fellow of the [employer] of Neu[employer].

## 2023-08-11 NOTE — Patient Instructions (Signed)
Trazodone Tablets What is this medication? TRAZODONE (TRAZ oh done) treats depression. It increases the amount of serotonin in the brain, a hormone that helps regulate mood. This medicine may be used for other purposes; ask your health care provider or pharmacist if you have questions. COMMON BRAND NAME(S): Desyrel What should I tell my care team before I take this medication? They need to know if you have any of these conditions: Bipolar disorder Bleeding disorder Glaucoma Heart disease, or previous heart attack Irregular heartbeat or rhythm Kidney disease Liver disease Low levels of sodium in the blood Suicidal thoughts, plans, or attempt by you or a family member An unusual or allergic reaction to trazodone, other medications, foods, dyes, or preservatives Pregnant or trying to get pregnant Breastfeeding How should I use this medication? Take this medication by mouth with a glass of water. Take it as directed on the prescription label at the same time every day. Take this medication shortly after a meal or a light snack. Keep taking this medication unless your care team tells you to stop. Stopping it too quickly can cause serious side effects. It can also make your condition worse. A special MedGuide will be given to you by the pharmacist with each prescription and refill. Be sure to read this information carefully each time. Talk to your care team about the use of this medication in children. Special care may be needed. Overdosage: If you think you have taken too much of this medicine contact a poison control center or emergency room at once. NOTE: This medicine is only for you. Do not share this medicine with others. What if I miss a dose? If you miss a dose, take it as soon as you can. If it is almost time for your next dose, take only that dose. Do not take double or extra doses. What may interact with this medication? Do not take this medication with any of the following: Certain  medications for fungal infections, such as fluconazole, itraconazole, ketoconazole, posaconazole, voriconazole Cisapride Dronedarone Linezolid MAOIs, such as Carbex, Eldepryl, Marplan, Nardil, and Parnate Mesoridazine Methylene blue (injected into a vein) Pimozide Saquinavir Thioridazine This medication may also interact with the following: Alcohol Antiviral medications for HIV or AIDS Aspirin and aspirin-like medications Barbiturates, such as phenobarbital Certain medications for blood pressure, heart disease, irregular heart beat Certain medications for mental health conditions Certain medications for migraine headache, such as almotriptan, eletriptan, frovatriptan, naratriptan, rizatriptan, sumatriptan, zolmitriptan Certain medications for seizures, such as carbamazepine and phenytoin Certain medications for sleep Certain medications that treat or prevent blood clots, such as dalteparin, enoxaparin, warfarin Digoxin Fentanyl Lithium NSAIDS, medications for pain and inflammation, such as ibuprofen or naproxen Other medications that cause heart rhythm changes Rasagiline Supplements, such as St. John's wort, kava kava, valerian Tramadol Tryptophan This list may not describe all possible interactions. Give your health care provider a list of all the medicines, herbs, non-prescription drugs, or dietary supplements you use. Also tell them if you smoke, drink alcohol, or use illegal drugs. Some items may interact with your medicine. What should I watch for while using this medication? Visit your care team for regular checks on your progress. Tell your care team if your symptoms do not start to get better or if they get worse. Because it may take several weeks to see the full effects of this medication, it is important to continue your treatment as prescribed by your care team. Watch for new or worsening thoughts of suicide or depression. This   includes sudden changes in mood, behaviors,  or thoughts. These changes can happen at any time but are more common in the beginning of treatment or after a change in dose. Call your care team right away if you experience these thoughts or worsening depression. This medication may cause mood and behavior changes, such as anxiety, nervousness, irritability, hostility, restlessness, excitability, hyperactivity, or trouble sleeping. These changes can happen at any time but are more common in the beginning of treatment or after a change in dose. Call your care team right away if you notice any of these symptoms. This medication may affect your coordination, reaction time, or judgment. Do not drive or operate machinery until you know how this medication affects you. Sit up or stand slowly to reduce the risk of dizzy or fainting spells. Drinking alcohol with this medication can increase the risk of these side effects. This medication may cause dry eyes and blurred vision. If you wear contact lenses you may feel some discomfort. Lubricating drops may help. See your care team if the problem does not go away or is severe. Your mouth may get dry. Chewing sugarless gum or sucking hard candy and drinking plenty of water may help. Contact your care team if the problem does not go away or is severe. What side effects may I notice from receiving this medication? Side effects that you should report to your care team as soon as possible: Allergic reactions--skin rash, itching, hives, swelling of the face, lips, tongue, or throat Bleeding--bloody or Theroux, tar-like stools, red or dark brown urine, vomiting blood or brown material that looks like coffee grounds, small, red or purple spots on skin, unusual bleeding or bruising Heart rhythm changes--fast or irregular heartbeat, dizziness, feeling faint or lightheaded, chest pain, trouble breathing Low blood pressure--dizziness, feeling faint or lightheaded, blurry vision Low sodium level--muscle weakness, fatigue,  dizziness, headache, confusion Prolonged or painful erection Serotonin syndrome--irritability, confusion, fast or irregular heartbeat, muscle stiffness, twitching muscles, sweating, high fever, seizures, chills, vomiting, diarrhea Sudden eye pain or change in vision such as blurry vision, seeing halos around lights, vision loss Thoughts of suicide or self-harm, worsening mood, feelings of depression Side effects that usually do not require medical attention (report to your care team if they continue or are bothersome): Change in sex drive or performance Constipation Dizziness Drowsiness Dry mouth This list may not describe all possible side effects. Call your doctor for medical advice about side effects. You may report side effects to FDA at 1-800-FDA-1088. Where should I keep my medication? Keep out of the reach of children and pets. Store at room temperature between 15 and 30 degrees C (59 to 86 degrees F). Protect from light. Keep container tightly closed. Throw away any unused medication after the expiration date. NOTE: This sheet is a summary. It may not cover all possible information. If you have questions about this medicine, talk to your doctor, pharmacist, or health care provider.  2024 Elsevier/Gold Standard (2022-10-17 00:00:00)  

## 2023-08-12 DIAGNOSIS — Z4889 Encounter for other specified surgical aftercare: Secondary | ICD-10-CM | POA: Diagnosis not present

## 2023-08-12 DIAGNOSIS — S82852D Displaced trimalleolar fracture of left lower leg, subsequent encounter for closed fracture with routine healing: Secondary | ICD-10-CM | POA: Diagnosis not present

## 2023-08-12 DIAGNOSIS — J301 Allergic rhinitis due to pollen: Secondary | ICD-10-CM | POA: Diagnosis not present

## 2023-08-12 DIAGNOSIS — F331 Major depressive disorder, recurrent, moderate: Secondary | ICD-10-CM | POA: Diagnosis not present

## 2023-08-13 ENCOUNTER — Other Ambulatory Visit: Payer: Self-pay | Admitting: Nurse Practitioner

## 2023-08-14 DIAGNOSIS — G4733 Obstructive sleep apnea (adult) (pediatric): Secondary | ICD-10-CM | POA: Diagnosis not present

## 2023-08-19 DIAGNOSIS — F419 Anxiety disorder, unspecified: Secondary | ICD-10-CM | POA: Diagnosis not present

## 2023-08-19 DIAGNOSIS — J449 Chronic obstructive pulmonary disease, unspecified: Secondary | ICD-10-CM | POA: Diagnosis not present

## 2023-08-19 DIAGNOSIS — M6281 Muscle weakness (generalized): Secondary | ICD-10-CM | POA: Diagnosis not present

## 2023-08-19 DIAGNOSIS — E785 Hyperlipidemia, unspecified: Secondary | ICD-10-CM | POA: Diagnosis not present

## 2023-08-19 DIAGNOSIS — Z9181 History of falling: Secondary | ICD-10-CM | POA: Diagnosis not present

## 2023-08-19 DIAGNOSIS — K219 Gastro-esophageal reflux disease without esophagitis: Secondary | ICD-10-CM | POA: Diagnosis not present

## 2023-08-19 DIAGNOSIS — W19XXXD Unspecified fall, subsequent encounter: Secondary | ICD-10-CM | POA: Diagnosis not present

## 2023-08-19 DIAGNOSIS — F32A Depression, unspecified: Secondary | ICD-10-CM | POA: Diagnosis not present

## 2023-08-19 DIAGNOSIS — I10 Essential (primary) hypertension: Secondary | ICD-10-CM | POA: Diagnosis not present

## 2023-08-19 DIAGNOSIS — Z79891 Long term (current) use of opiate analgesic: Secondary | ICD-10-CM | POA: Diagnosis not present

## 2023-08-19 DIAGNOSIS — S82852D Displaced trimalleolar fracture of left lower leg, subsequent encounter for closed fracture with routine healing: Secondary | ICD-10-CM | POA: Diagnosis not present

## 2023-08-21 ENCOUNTER — Other Ambulatory Visit: Payer: Self-pay

## 2023-08-21 ENCOUNTER — Ambulatory Visit (INDEPENDENT_AMBULATORY_CARE_PROVIDER_SITE_OTHER): Payer: Medicare HMO | Admitting: Nurse Practitioner

## 2023-08-21 ENCOUNTER — Encounter: Payer: Self-pay | Admitting: Nurse Practitioner

## 2023-08-21 VITALS — BP 110/72 | HR 101 | Temp 97.5°F

## 2023-08-21 DIAGNOSIS — J069 Acute upper respiratory infection, unspecified: Secondary | ICD-10-CM

## 2023-08-21 DIAGNOSIS — Z1152 Encounter for screening for COVID-19: Secondary | ICD-10-CM | POA: Diagnosis not present

## 2023-08-21 DIAGNOSIS — J4522 Mild intermittent asthma with status asthmaticus: Secondary | ICD-10-CM | POA: Diagnosis not present

## 2023-08-21 DIAGNOSIS — R062 Wheezing: Secondary | ICD-10-CM

## 2023-08-21 LAB — POC COVID19 BINAXNOW: SARS Coronavirus 2 Ag: NEGATIVE

## 2023-08-21 MED ORDER — DOXYCYCLINE HYCLATE 100 MG PO TABS
100.0000 mg | ORAL_TABLET | Freq: Two times a day (BID) | ORAL | 0 refills | Status: AC
Start: 2023-08-21 — End: 2023-08-28

## 2023-08-21 MED ORDER — IPRATROPIUM-ALBUTEROL 0.5-2.5 (3) MG/3ML IN SOLN
3.0000 mL | Freq: Once | RESPIRATORY_TRACT | Status: AC
Start: 2023-08-21 — End: ?

## 2023-08-21 MED ORDER — BUDESONIDE 0.5 MG/2ML IN SUSP
0.5000 mg | Freq: Every day | RESPIRATORY_TRACT | 2 refills | Status: AC | PRN
Start: 2023-08-21 — End: ?

## 2023-08-21 MED ORDER — PROMETHAZINE-DM 6.25-15 MG/5ML PO SYRP
5.0000 mL | ORAL_SOLUTION | Freq: Four times a day (QID) | ORAL | 0 refills | Status: DC | PRN
Start: 2023-08-21 — End: 2023-08-25

## 2023-08-21 NOTE — Patient Instructions (Signed)

## 2023-08-21 NOTE — Progress Notes (Signed)
Assessment and Plan:  Haley White was seen today for an episodic.  Diagnoses and all order for this visit:  Encounter for screening for COVID-19 Negative  - POC COVID-19  Upper respiratory tract infection, unspecified type Start tmt with Doxycycline - take in full as directed to reduce risk for abx resistance. Duoneb administered - patient tolerated well. Continue Ventolin, Pulmicort nebulizer, Allegra, Flonase, Singulair  Start Promethazine cough syrup. Stay well hydrated to keep mucus thin and productive.  - doxycycline (VIBRA-TABS) 100 MG tablet; Take 1 tablet (100 mg total) by mouth 2 (two) times daily for 7 days.  Dispense: 14 tablet; Refill: 0 - promethazine-dextromethorphan (PROMETHAZINE-DM) 6.25-15 MG/5ML syrup; Take 5 mLs by mouth 4 (four) times daily as needed for cough.  Dispense: 240 mL; Refill: 0  Mild intermittent asthma with status asthmaticus/Wheezing Continue Ventolin, Pulmicort nebulizer, Allegra, Flonase, Singulair  Start Promethazine cough syrup. Report to ER for any increase in difficulty breathing  - ipratropium-albuterol (DUONEB) 0.5-2.5 (3) MG/3ML nebulizer solution 3 mL - budesonide (PULMICORT) 0.5 MG/2ML nebulizer solution; Take 2 mLs (0.5 mg total) by nebulization daily as needed.  Dispense: 2 mL; Refill: 2   Notify office for further evaluation and treatment, questions or concerns if s/s fail to improve. The risks and benefits of my recommendations, as well as other treatment options were discussed with the patient today. Questions were answered.  Further disposition pending results of labs. Discussed med's effects and SE's.    Over 20 minutes of exam, counseling, chart review, and critical decision making was performed.   Future Appointments  Date Time Provider Department Center  10/24/2023 10:30 AM Adela Glimpse, NP [facility] None  01/22/2024 11:00 AM Lucky Cowboy, MD [facility] None  03/01/2024 12:00 PM [facility] DX DEXA 1 [facility]  03/01/2024 12:30 PM [facility] MM 2 [facility]  07/23/2024 10:30 AM Faria Casella, Archie Patten, NP [facility] None  08/16/2024  3:45 PM McCue, Shanda Bumps, NP [facility] None    ------------------------------------------------------------------------------------------------------------------   HPI BP 110/72   Pulse (!) 101   Temp (!) 97.5 F (36.4 C)   SpO2 98%    Patient complains of symptoms of a URI, possible sinusitis. Symptoms include congestion, cough described as nonproductive, facial pain, headache described as pounding, nasal congestion, shortness of breath, sinus pressure, sneezing, sore throat, and wheezing. Onset of symptoms was 5 days ago, and has been unchanged since that time. Treatment to date: antihistamines, cough suppressants, decongestants, nasal steroids, and inhalers without relief.  She has a hx of mild intermittent asthma .  Denies fever, chills, N/V.  Has recently traveled across the state with her daughter.    Past Medical History:  Diagnosis Date   Anxiety    Arthritis    Asthma    Cataract    Depression    Gout    Hyperlipidemia    Hypertension    Obese      Allergies  Allergen Reactions   Molds & Smuts Anaphylaxis   Biaxin [Clarithromycin]     GI upset   Biaxin [Clarithromycin] Other (See Comments)    GI Upset   Prednisone     Was admitted for self harm when on prednisone before   Prednisone Other (See Comments)    Suicidal   Serevent [Salmeterol]     Palpitations   Tequin [Gatifloxacin]     GI upset. thrush   Tequin [Gatifloxacin] Other (See Comments)    GI upset, Thrush   Serevent [Salmeterol] Palpitations    Current Outpatient  Medications on File Prior to Visit  Medication Sig   albuterol (VENTOLIN HFA) 108 (90 Base) MCG/ACT inhaler Inhale 2 puffs into the lungs every 6 (six) hours as needed for wheezing or shortness of breath.   buPROPion (WELLBUTRIN XL) 300 MG 24 hr tablet Take 300 mg by mouth daily.   EPINEPHrine 0.3 mg/0.3 mL IJ SOAJ  injection See admin instructions.   fexofenadine (ALLEGRA) 180 MG tablet Take 180 mg by mouth daily as needed for allergies.   FLUoxetine (PROZAC) 20 MG capsule Take 60 mg by mouth daily.   fluticasone (FLONASE) 50 MCG/ACT nasal spray Place 1 spray into both nostrils daily as needed for allergies or rhinitis.   levocetirizine (XYZAL) 5 MG tablet Take 5 mg by mouth every evening.   lisinopril (ZESTRIL) 20 MG tablet Take 20 mg by mouth daily.   methylphenidate (RITALIN) 10 MG tablet Take 10 mg by mouth 2 (two) times daily.   montelukast (SINGULAIR) 10 MG tablet Take 10 mg by mouth at bedtime.   neomycin-polymyxin b-dexamethasone (MAXITROL) 3.5-10000-0.1 OINT Apply a small amount into the lower eyelid 3 to 4 x / day   NON FORMULARY Inject 1 Dose into the skin once a week. Allergy shots from [facility] [place]. Patient not sure about the name of medicine   pantoprazole (PROTONIX) 40 MG tablet TAKE 1 TABLET BY MOUTH DAILY TO PREVENT INDIGESTION AND HEARTBURN   rosuvastatin (CRESTOR) 40 MG tablet Take 40 mg by mouth daily.   SYMBICORT 160-4.5 MCG/ACT inhaler Inhale 2 puffs into the lungs 2 (two) times daily.   traZODone (DESYREL) 50 MG tablet Take 1 tablet (50 mg total) by mouth at bedtime.   VITAMIN D PO Take 10,000 Units by mouth daily.   traMADol (ULTRAM) 50 MG tablet Take 1 tablet (50 mg total) by mouth every 8 (eight) hours as needed for moderate pain. (Patient not taking: Reported on 08/21/2023)   No current facility-administered medications on file prior to visit.    ROS: all negative except what is noted in the HPI.   Physical Exam:  BP 110/72   Pulse (!) 101   Temp (!) 97.5 F (36.4 C)   SpO2 98%   General Appearance: NAD.  Awake, conversant and cooperative. Eyes: PERRLA, EOMs intact.  Sclera white.  Conjunctiva without erythema. Sinuses: No frontal/maxillary tenderness.  No nasal discharge. Nares patent.  ENT/Mouth: Ext aud canals clear.  Bilateral TMs w/DOL and  without erythema or bulging. Hearing intact.  Posterior pharynx without swelling or exudate.  Tonsils without swelling or erythema.  Neck: Supple.  No masses, nodules or thyromegaly. Respiratory: Effort is regular with non-labored breathing. Breath sounds are equal bilaterally with scattered wheezing throughout posterior lung field upon expiration. Cardio: RRR with no MRGs. Brisk peripheral pulses without edema.  Abdomen: Active BS in all four quadrants.  Soft and non-tender without guarding, rebound tenderness, hernias or masses. Lymphatics: Non tender without lymphadenopathy.  Musculoskeletal: Full ROM, 5/5 strength, normal ambulation.  No clubbing or cyanosis. Skin: Appropriate color for ethnicity. Warm without rashes, lesions, ecchymosis, ulcers.  Neuro: CN II-XII grossly normal. Normal muscle tone without cerebellar symptoms and intact sensation.   Psych: AO X 3,  appropriate mood and affect, insight and judgment.     Adela Glimpse, NP 11:54 AM [facility]

## 2023-08-22 DIAGNOSIS — Z780 Asymptomatic menopausal state: Secondary | ICD-10-CM | POA: Diagnosis not present

## 2023-08-22 LAB — HM DEXA SCAN

## 2023-08-25 ENCOUNTER — Encounter (HOSPITAL_COMMUNITY): Payer: Self-pay

## 2023-08-25 ENCOUNTER — Ambulatory Visit (HOSPITAL_COMMUNITY)
Admission: EM | Admit: 2023-08-25 | Discharge: 2023-08-25 | Disposition: A | Discharge status: Home / Self Care | Payer: Medicare HMO | Attending: Internal Medicine | Admitting: Internal Medicine

## 2023-08-25 DIAGNOSIS — J209 Acute bronchitis, unspecified: Secondary | ICD-10-CM | POA: Diagnosis not present

## 2023-08-25 DIAGNOSIS — F411 Generalized anxiety disorder: Secondary | ICD-10-CM

## 2023-08-25 LAB — POCT FASTING CBG KUC MANUAL ENTRY: POCT Glucose (KUC): 108 mg/dL — AB (ref 70–99)

## 2023-08-25 MED ORDER — IPRATROPIUM-ALBUTEROL 0.5-2.5 (3) MG/3ML IN SOLN
RESPIRATORY_TRACT | Status: AC
  Filled 2023-08-25: qty 3

## 2023-08-25 MED ORDER — DEXAMETHASONE SODIUM PHOSPHATE 10 MG/ML IJ SOLN
INTRAMUSCULAR | Status: AC
  Filled 2023-08-25: qty 1

## 2023-08-25 MED ORDER — PROMETHAZINE-DM 6.25-15 MG/5ML PO SYRP: 2.5000 mL | ORAL_SOLUTION | Freq: Every evening | ORAL | 0 refills | Status: DC | PRN

## 2023-08-25 MED ORDER — ALBUTEROL SULFATE (2.5 MG/3ML) 0.083% IN NEBU
INHALATION_SOLUTION | RESPIRATORY_TRACT | Status: AC
  Filled 2023-08-25: qty 3

## 2023-08-25 MED ORDER — DEXAMETHASONE SODIUM PHOSPHATE 10 MG/ML IJ SOLN
10.0000 mg | Freq: Once | INTRAMUSCULAR | Status: AC
  Administered 2023-08-25: 10 mg via INTRAMUSCULAR

## 2023-08-25 MED ORDER — IPRATROPIUM-ALBUTEROL 0.5-2.5 (3) MG/3ML IN SOLN
3.0000 mL | Freq: Once | RESPIRATORY_TRACT | Status: AC
  Administered 2023-08-25: 3 mL via RESPIRATORY_TRACT

## 2023-08-25 MED ORDER — BENZONATATE 100 MG PO CAPS: 100.0000 mg | ORAL_CAPSULE | Freq: Three times a day (TID) | ORAL | 0 refills | Status: DC

## 2023-08-25 MED ORDER — ALBUTEROL SULFATE (2.5 MG/3ML) 0.083% IN NEBU
2.5000 mg | INHALATION_SOLUTION | Freq: Once | RESPIRATORY_TRACT | Status: AC
  Administered 2023-08-25: 2.5 mg via RESPIRATORY_TRACT

## 2023-08-25 NOTE — ED Notes (Signed)
Pt in process of neb tx and started coughing hard, shaking, and hyperventilating. Marcelino Duster, NP at bedside.

## 2023-08-25 NOTE — ED Provider Notes (Signed)
[facility]    CSN: [id number] Arrival date & time: 08/25/23  1138      History   Chief Complaint Chief Complaint  Patient presents with   Cough    HPI Haley White is a 67 y.o. female.   Haley White is a 67 y.o. female presenting for chief complaint of Cough that started approximately 7 days ago. Cough is dry, non-productive, and hacking. Reports shortness of breath and bilateral chest discomfort associate with coughing. Denies heart palpitations, N/V/D, abdominal pain, fever, chills, nasal congestion, sore throat, rash. History of asthma, denies recent asthma exacerbations. Using albuterol inhaler at home without relief of symptoms. Never smoker, denies drug use. Taking OTC medicines without relief.    Cough   Past Medical History:  Diagnosis Date   Anxiety    Arthritis    Asthma    Cataract    Depression    Gout    Hyperlipidemia    Hypertension    Obese     Patient Active Problem List   Diagnosis Date Noted   OSA on CPAP 08/11/2023   Ankle fracture 06/03/2023   Trimalleolar fracture of left ankle 06/02/2023   Acute left ankle pain 06/02/2023   Sleep-related hypoxia 03/24/2023   At risk for obstructive sleep apnea 03/24/2023   Acute pulmonary embolism (HCC) 05/10/2022   Allergic rhinitis 03/15/2021   Gastro-esophageal reflux disease without esophagitis 03/15/2021   Mild persistent asthma, uncomplicated 03/15/2021   Aortic atherosclerosis (HCC) by Chest CT scan on 02/02/2018  01/10/2021   Asthma    Low back pain 03/17/2019   Lumbosacral spondylosis without myelopathy 03/17/2019   Pseudogout 12/09/2017   Major depressive disorder, recurrent, severe without psychotic features (HCC)    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   Suicide attempt by drug ingestion (HCC)    Vitamin D deficiency 08/22/2014   Hyperlipidemia, mixed 08/22/2014   Hypertension    Anxiety    Abnormal glucose    Class 2 severe obesity due to excess calories with serious  comorbidity and body mass index (BMI) of 37.0 to 37.9 in adult [facility])     Past Surgical History:  Procedure Laterality Date   ANKLE SURGERY Left    APPENDECTOMY  1984   APPENDECTOMY     CATARACT EXTRACTION W/ INTRAOCULAR LENS  IMPLANT, BILATERAL  2014   CESAREAN SECTION  1984, 1987, 1994   X3   CESAREAN SECTION     x3   COLONOSCOPY  11/16/2013   w/Brodie    EXTERNAL FIXATION LEG Left 06/04/2023   Procedure: EXTERNAL FIXATION LEG;  Surgeon: Netta Cedars, MD;  Location: [facility];  Service: Orthopedics;  Laterality: Left;   EYE SURGERY Bilateral    lasix   FOOT FRACTURE SURGERY Left    FOOT SURGERY Left 2002   FRACTURE SURGERY Left    left ankle   KNEE SURGERY Left 1998   ORIF ANKLE FRACTURE Left 06/04/2023   Procedure: OPEN REDUCTION INTERNAL FIXATION (ORIF) ANKLE FRACTURE vs EX FIX;  Surgeon: Netta Cedars, MD;  Location: [facility];  Service: Orthopedics;  Laterality: Left;   ROTATOR CUFF REPAIR Right 2007   shoulder     torn rotator cuff   TUBAL LIGATION  1994   TUBAL LIGATION      OB History     Gravida  3   Para  0   Term  0   Preterm  0   AB  0   Living  SAB  0   IAB  0   Ectopic  0   Multiple      Live Births               Home Medications    Prior to Admission medications   Medication Sig Start Date End Date Taking? Authorizing Provider  benzonatate (TESSALON) 100 MG capsule Take 1 capsule (100 mg total) by mouth every 8 (eight) hours. 08/25/23  Yes Carlisle Beers, FNP  promethazine-dextromethorphan (PROMETHAZINE-DM) 6.25-15 MG/5ML syrup Take 2.5 mLs by mouth at bedtime as needed for cough. 08/25/23  Yes Carlisle Beers, FNP  albuterol (VENTOLIN HFA) 108 (90 Base) MCG/ACT inhaler Inhale 2 puffs into the lungs every 6 (six) hours as needed for wheezing or shortness of breath.    [provider]  budesonide (PULMICORT) 0.5 MG/2ML nebulizer solution Take 2 mLs (0.5 mg total) by nebulization daily as needed.  08/21/23   Adela Glimpse, NP  buPROPion (WELLBUTRIN XL) 300 MG 24 hr tablet Take 300 mg by mouth daily.    [provider]  doxycycline (VIBRA-TABS) 100 MG tablet Take 1 tablet (100 mg total) by mouth 2 (two) times daily for 7 days. 08/21/23 08/28/23  Adela Glimpse, NP  EPINEPHrine 0.3 mg/0.3 mL IJ SOAJ injection See admin instructions. 02/08/20   [provider]  fexofenadine (ALLEGRA) 180 MG tablet Take 180 mg by mouth daily as needed for allergies.    [provider]  FLUoxetine (PROZAC) 20 MG capsule Take 60 mg by mouth daily.    [provider]  fluticasone (FLONASE) 50 MCG/ACT nasal spray Place 1 spray into both nostrils daily as needed for allergies or rhinitis.    [provider]  levocetirizine (XYZAL) 5 MG tablet Take 5 mg by mouth every evening. 12/07/17   [provider]  lisinopril (ZESTRIL) 20 MG tablet Take 20 mg by mouth daily.    [provider]  methylphenidate (RITALIN) 10 MG tablet Take 10 mg by mouth 2 (two) times daily. 01/16/18   [provider]  montelukast (SINGULAIR) 10 MG tablet Take 10 mg by mouth at bedtime.    [provider]  neomycin-polymyxin b-dexamethasone (MAXITROL) 3.5-10000-0.1 OINT Apply a small amount into the lower eyelid 3 to 4 x / day 07/19/23   Lucky Cowboy, MD  NON FORMULARY Inject 1 Dose into the skin once a week. Allergy shots from [facility] at [place]. Patient not sure about the name of medicine    [provider]  pantoprazole (PROTONIX) 40 MG tablet TAKE 1 TABLET BY MOUTH DAILY TO PREVENT INDIGESTION AND HEARTBURN 06/20/23   Raynelle Dick, NP  rosuvastatin (CRESTOR) 40 MG tablet Take 40 mg by mouth daily.    [provider]  SYMBICORT 160-4.5 MCG/ACT inhaler Inhale 2 puffs into the lungs 2 (two) times daily. 01/16/18   [provider]  traMADol (ULTRAM) 50 MG tablet Take 1 tablet (50 mg total) by mouth every 8 (eight) hours as  needed for moderate pain. Patient not taking: Reported on 08/21/2023 06/07/23   Miguel Rota, MD  traZODone (DESYREL) 50 MG tablet Take 1 tablet (50 mg total) by mouth at bedtime. 08/11/23   Dohmeier, Porfirio Mylar, MD  VITAMIN D PO Take 10,000 Units by mouth daily.    [provider]    Family History Family History  Problem Relation Age of Onset   Dementia Father    Anxiety disorder Sister    Colon cancer Neg Hx  Colon polyps Neg Hx    Esophageal cancer Neg Hx    Stomach cancer Neg Hx    Rectal cancer Neg Hx     Social History Social History   Tobacco Use   Smoking status: Never   Smokeless tobacco: Never  Vaping Use   Vaping status: Never Used  Substance Use Topics   Alcohol use: Yes    Comment: occasionally   Drug use: Never    Comment: 06/03/2022-gummies     Allergies   Molds & smuts, Biaxin [clarithromycin], Biaxin [clarithromycin], Prednisone, Prednisone, Serevent [salmeterol], Tequin [gatifloxacin], Tequin [gatifloxacin], and Serevent [salmeterol]   Review of Systems Review of Systems  Respiratory:  Positive for cough.   Per HPI   Physical Exam Triage Vital Signs ED Triage Vitals [08/25/23 1304]  Encounter Vitals Group     BP (!) 146/106     Systolic BP Percentile      Diastolic BP Percentile      Pulse Rate (!) 112     Resp 16     Temp 98.6 F (37 C)     Temp Source Oral     SpO2 96 %     Weight 220 lb (99.8 kg)     Height 5\' 2"  (1.575 m)     Head Circumference      Peak Flow      Pain Score 0     Pain Loc      Pain Education      Exclude from Growth Chart    No data found.  Updated Vital Signs BP (!) 146/106 (BP Location: Right Arm)   Pulse (!) 112   Temp 98.6 F (37 C) (Oral)   Resp 16   Ht 5\' 2"  (1.575 m)   Wt 220 lb (99.8 kg)   SpO2 96%   BMI 40.24 kg/m   Visual Acuity Right Eye Distance:   Left Eye Distance:   Bilateral Distance:    Right Eye Near:   Left Eye Near:    Bilateral Near:     Physical Exam Vitals and  nursing note reviewed.  Constitutional:      Appearance: She is ill-appearing. She is not toxic-appearing.  HENT:     Head: Normocephalic and atraumatic.     Right Ear: Hearing, tympanic membrane, ear canal and external ear normal.     Left Ear: Hearing, tympanic membrane, ear canal and external ear normal.     Nose: Congestion present.     Mouth/Throat:     Lips: Pink.     Mouth: Mucous membranes are moist. No injury.     Tongue: No lesions. Tongue does not deviate from midline.     Palate: No mass and lesions.     Pharynx: Oropharynx is clear. Uvula midline. Posterior oropharyngeal erythema present. No pharyngeal swelling, oropharyngeal exudate or uvula swelling.     Tonsils: No tonsillar exudate or tonsillar abscesses.  Eyes:     General: Lids are normal. Vision grossly intact. Gaze aligned appropriately.     Extraocular Movements: Extraocular movements intact.     Conjunctiva/sclera: Conjunctivae normal.  Cardiovascular:     Rate and Rhythm: Normal rate and regular rhythm.     Heart sounds: Normal heart sounds, S1 normal and S2 normal.  Pulmonary:     Effort: Pulmonary effort is normal. No respiratory distress.     Breath sounds: Normal breath sounds and air entry. No wheezing, rhonchi or rales.  Chest:     Chest wall: No  tenderness.  Musculoskeletal:     Cervical back: Neck supple.  Lymphadenopathy:     Cervical: No cervical adenopathy.  Skin:    General: Skin is warm and dry.     Capillary Refill: Capillary refill takes less than 2 seconds.     Findings: No rash.  Neurological:     General: No focal deficit present.     Mental Status: She is alert and oriented to person, place, and time. Mental status is at baseline.     Cranial Nerves: No dysarthria or facial asymmetry.  Psychiatric:        Mood and Affect: Mood normal.        Speech: Speech normal.        Behavior: Behavior normal.        Thought Content: Thought content normal.        Judgment: Judgment normal.       UC Treatments / Results  Labs (all labs ordered are listed, but only abnormal results are displayed) Labs Reviewed  POCT FASTING CBG [facility] MANUAL ENTRY - Abnormal; Notable for the following components:      Result Value   POCT Glucose ([facility]) 108 (*)    All other components within normal limits    EKG   Radiology No results found.  Procedures Procedures (including critical care time)  Medications Ordered in UC Medications  ipratropium-albuterol (DUONEB) 0.5-2.5 (3) MG/3ML nebulizer solution 3 mL (3 mLs Nebulization Given 08/25/23 1456)  dexamethasone (DECADRON) injection 10 mg (10 mg Intramuscular Given 08/25/23 1454)  albuterol (PROVENTIL) (2.5 MG/3ML) 0.083% nebulizer solution 2.5 mg (2.5 mg Nebulization Given 08/25/23 1518)    Initial Impression / Assessment and Plan / UC Course  I have reviewed the triage vital signs and the nursing notes.  Pertinent labs & imaging results that were available during my care of the patient were reviewed by me and considered in my medical decision making (see chart for details).   1. Acute bronchitis Presentation consistent with acute viral bronchitis. Patient non-toxic in appearance, vital signs hemodynamically stable, no new oxygen requirement. Low suspicion for acute cardiopulmonary abnormality, therefore deferred imaging of the chest. Deferred COVID-19 testing based on timing of illness.  Will treat with steroid, bronchodilator, cough suppressants for symptomatic relief, and expectorants (mucinex) as needed.  Patient has adverse reaction to prednisone (suicidal ideation) but states that she has had dexamethasone in the past and has not had any adverse reactions.  Patient given DuoNeb and albuterol breathing treatments which significantly improved her shortness of breath and lung sounds. She started to hyperventilate and cough frequently causing her to become dizzy and feel like she was going to pass out while in the clinic  receiving the albuterol breathing treatment.  Patient was provided a snack and a soda as she has not eaten today and she started feeling better. Vital signs at discharge are stable with blood pressure at 138/84, heart rate 95, respirations 22, and oxygenation at 98% on room air.  Counseled patient on potential for adverse effects with medications prescribed/recommended today, strict ER and return-to-clinic precautions discussed, patient verbalized understanding.     Final Clinical Impressions(s) / UC Diagnoses   Final diagnoses:  Acute bronchitis, unspecified organism     Discharge Instructions      You have bronchitis which is inflammation of the upper airways in your lungs due to a virus. The following medicines will help with your symptoms.   - You may use albuterol inhaler 1 to 2 puffs every  4-6 hours as needed for cough, shortness of breath, and wheezing. - Take cough medicines as prescribed.  - Continue using over the counter medicines as needed as directed. Plain mucinex (guaifenesin) over the counter may further help breakup mucus and help with symptoms.  - Continue doxycycline until finished.  If you develop any new or worsening symptoms or do not improve in the next 2 to 3 days, please return.  If your symptoms are severe, please go to the emergency room. Follow-up with PCP as needed.   ED Prescriptions     Medication Sig Dispense Auth. Provider   benzonatate (TESSALON) 100 MG capsule Take 1 capsule (100 mg total) by mouth every 8 (eight) hours. 21 capsule Carlisle Beers, FNP   promethazine-dextromethorphan (PROMETHAZINE-DM) 6.25-15 MG/5ML syrup Take 2.5 mLs by mouth at bedtime as needed for cough. 118 mL Carlisle Beers, FNP      PDMP not reviewed this encounter.   Carlisle Beers, [place] 08/25/23 [phone]

## 2023-08-25 NOTE — ED Triage Notes (Signed)
Patient here today with c/o cough, SOB, and wheeze X 1 week. PCP prescribed an antibiotic and she has been taking Mucinex with no relief. Daughter had a cold a week ago.

## 2023-08-25 NOTE — Discharge Instructions (Signed)
You have bronchitis which is inflammation of the upper airways in your lungs due to a virus. The following medicines will help with your symptoms.   - You may use albuterol inhaler 1 to 2 puffs every 4-6 hours as needed for cough, shortness of breath, and wheezing. - Take cough medicines as prescribed.  - Continue using over the counter medicines as needed as directed. Plain mucinex (guaifenesin) over the counter may further help breakup mucus and help with symptoms.  - Continue doxycycline until finished.  If you develop any new or worsening symptoms or do not improve in the next 2 to 3 days, please return.  If your symptoms are severe, please go to the emergency room. Follow-up with PCP as needed.

## 2023-08-25 NOTE — ED Notes (Addendum)
Upon entering room to discuss discharge paperwork, pt very shaky, c/o dizziness. This RN assisted patient to sitting position so could take a few sips of her drink. Pt lowered back down in treatment chair. Replaced the wet washcloth on patient's head.   Berdine Dance, NP aware of patient's condition. States that she will go re-evaluate patient.

## 2023-08-25 NOTE — ED Notes (Signed)
Daughter requested someone to be with patient when standing for first time.  Patient handled standing without event.  Provided extra ice packs .  Instructions were  provided by Marcelino Duster, NP

## 2023-08-26 DIAGNOSIS — F331 Major depressive disorder, recurrent, moderate: Secondary | ICD-10-CM | POA: Diagnosis not present

## 2023-08-27 ENCOUNTER — Ambulatory Visit
Admission: RE | Admit: 2023-08-27 | Discharge: 2023-08-27 | Disposition: A | Discharge status: Home / Self Care | Payer: Medicare HMO | Source: Ambulatory Visit | Attending: Nurse Practitioner | Admitting: Nurse Practitioner

## 2023-08-27 ENCOUNTER — Encounter: Payer: Self-pay | Admitting: Nurse Practitioner

## 2023-08-27 ENCOUNTER — Ambulatory Visit (INDEPENDENT_AMBULATORY_CARE_PROVIDER_SITE_OTHER): Payer: Medicare HMO | Admitting: Nurse Practitioner

## 2023-08-27 ENCOUNTER — Other Ambulatory Visit: Payer: Self-pay

## 2023-08-27 VITALS — BP 116/50 | HR 87 | Temp 97.4°F | Ht 62.0 in | Wt 220.0 lb

## 2023-08-27 DIAGNOSIS — J4541 Moderate persistent asthma with (acute) exacerbation: Secondary | ICD-10-CM

## 2023-08-27 DIAGNOSIS — R052 Subacute cough: Secondary | ICD-10-CM

## 2023-08-27 DIAGNOSIS — R062 Wheezing: Secondary | ICD-10-CM | POA: Diagnosis not present

## 2023-08-27 DIAGNOSIS — R059 Cough, unspecified: Secondary | ICD-10-CM | POA: Diagnosis not present

## 2023-08-27 DIAGNOSIS — J069 Acute upper respiratory infection, unspecified: Secondary | ICD-10-CM

## 2023-08-27 MED ORDER — DEXAMETHASONE 4 MG PO TABS
ORAL_TABLET | ORAL | 0 refills | Status: DC
Start: 2023-08-27 — End: 2023-10-24

## 2023-08-27 NOTE — Patient Instructions (Signed)
Asthma, Adult  Asthma is a condition that causes swelling and narrowing of the airways. These are the passages that lead from the nose and mouth down into the lungs. When asthma symptoms get worse it is called an asthma attack or flare. This can make it hard to breathe. Asthma flares can range from minor to life-threatening. There is no cure for asthma, but medicines and lifestyle changes can help to control it. What are the causes? It is not known exactly what causes asthma, but certain things can cause asthma symptoms to get worse (triggers). What can trigger an asthma attack? Cigarette smoke. Mold. Dust. Your pet's skin flakes (dander). Cockroaches. Pollen. Air pollution (like household cleaners, wood smoke, smog, or Therapist, occupational). What are the signs or symptoms? Trouble breathing (shortness of breath). Coughing. Making high-pitched whistling sounds when you breathe, most often when you breathe out (wheezing). Chest tightness. Tiredness with little activity. Poor exercise tolerance. How is this treated? Controller medicines that help prevent asthma symptoms. Fast-acting reliever or rescue medicines. These give short-term relief of asthma symptoms. Allergy medicines if your attacks are brought on by allergens. Medicines to help control the body's defense (immune) system. Staying away from the things that cause asthma attacks. Follow these instructions at home: Avoiding triggers in your home Do not allow anyone to smoke in your home. Limit use of fireplaces and wood stoves. Get rid of pests (such as roaches and mice) and their droppings. Keep your home clean. Clean your floors. Dust regularly. Use cleaning products that do not smell. Wash bed sheets and blankets every week in hot water. Dry them in a dryer. Have someone vacuum when you are not home. Change your heating and air conditioning filters often. Use blankets that are made of polyester or cotton. General  instructions Take over-the-counter and prescription medicines only as told by your doctor. Do not smoke or use any products that contain nicotine or tobacco. If you need help quitting, ask your doctor. Stay away from secondhand smoke. Avoid doing things outdoors when allergen counts are high and when air quality is low. Warm up before you exercise. Take time to cool down after exercise. Use a peak flow meter as told by your doctor. A peak flow meter is a tool that measures how well your lungs are working. Keep track of the peak flow meter's readings. Write them down. Follow your asthma action plan. This is a written plan for taking care of your asthma and treating your attacks. Make sure you get all the shots (vaccines) that your doctor recommends. Ask your doctor about a flu shot and a pneumonia shot. Keep all follow-up visits. Contact a doctor if: You have wheezing, shortness of breath, or a cough even while taking medicine to prevent attacks. The mucus you cough up (sputum) is thicker than usual. The mucus you cough up changes from clear or white to yellow, green, gray, or is bloody. You have problems from the medicine you are taking, such as: A rash. Itching. Swelling. Trouble breathing. You need reliever medicines more than 2-3 times a week. Your peak flow reading is still at 50-79% of your personal best after following the action plan for 1 hour. You have a fever. Get help right away if: You seem to be worse and are not responding to medicine during an asthma attack. You are short of breath even at rest. You get short of breath when doing very little activity. You have trouble eating, drinking, or talking. You have chest  pain or tightness. You have a fast heartbeat. Your lips or fingernails start to turn blue. You are light-headed or dizzy, or you faint. Your peak flow is less than 50% of your personal best. You feel too tired to breathe normally. These symptoms may be an  emergency. Get help right away. Call 911. Do not wait to see if the symptoms will go away. Do not drive yourself to the hospital. Summary Asthma is a long-term (chronic) condition in which the airways get tight and narrow. An asthma attack can make it hard to breathe. Asthma cannot be cured, but medicines and lifestyle changes can help control it. Make sure you understand how to avoid triggers and how and when to use your medicines. Avoid things that can cause allergy symptoms (allergens). These include animal skin flakes (dander) and pollen from trees or grass. Avoid things that pollute the air. These may include household cleaners, wood smoke, smog, or chemical odors. This information is not intended to replace advice given to you by your health care provider. Make sure you discuss any questions you have with your health care provider. Document Revised: 07/30/2021 Document Reviewed: 07/30/2021 Elsevier Patient Education  2024 [employer].

## 2023-08-27 NOTE — Progress Notes (Signed)
Assessment and Plan:  Ginette Pitman was seen today for an episodic visit.  Diagnoses and all order for this visit:  Asthma exacerbation, non-allergic, moderate persistent Start Decadron taper - patient able to tolerate. Continue Doxycycline Continue Ventolin, Pulmicort nebulizer, Allegra, Flonase, Singulair  Continue Promethazine cough syrup. Stay well hydrated to keep mucus thin and productive. Reach out to Pulmonology to schedule follow up visit given recent exacerbation not responding well to current treatment.  - dexamethasone (DECADRON) 4 MG tablet; Take 1 tab 3 x /day for 2 days, then 2 x /day for 2  Days,  then 1 tab daily  Dispense: 13 tablet; Refill: 0  Upper respiratory tract infection, unspecified type Updated CXR to assess for any underlying etiology contributing to symptoms  - DG Chest 2 View; Future - dexamethasone (DECADRON) 4 MG tablet; Take 1 tab 3 x /day for 2 days, then 2 x /day for 2  Days,  then 1 tab daily  Dispense: 13 tablet; Refill: 0  Wheezing Start dexamethazone; continue inhalers  - DG Chest 2 View; Future - dexamethasone (DECADRON) 4 MG tablet; Take 1 tab 3 x /day for 2 days, then 2 x /day for 2  Days,  then 1 tab daily  Dispense: 13 tablet; Refill: 0  Subacute cough Continue Promethazine cough syrup. Stay well hydrated to keep mucus thin and productive.  - DG Chest 2 View; Future  Report to ER or call 911 for any increase in difficulty breathing.  Notify office for further evaluation and treatment, questions or concerns if s/s fail to improve. The risks and benefits of my recommendations, as well as other treatment options were discussed with the patient today. Questions were answered.  Further disposition pending results of labs. Discussed med's effects and SE's.    Over 20 minutes of exam, counseling, chart review, and critical decision making was performed.   Future Appointments  Date Time Provider Department Center  08/27/2023  2:00 PM  Adela Glimpse, NP [facility] None  10/24/2023 10:30 AM Adela Glimpse, NP [facility] None  01/22/2024 11:00 AM Lucky Cowboy, MD [facility] None  03/01/2024 12:00 PM [facility] DX DEXA 1 [facility]  03/01/2024 12:30 PM [facility] MM 2 [facility]  07/23/2024 10:30 AM Maurie Olesen, Archie Patten, NP [facility] None  08/16/2024  3:45 PM McCue, Shanda Bumps, NP [facility] None    ------------------------------------------------------------------------------------------------------------------   HPI BP (!) 116/50   Pulse 87   Temp (!) 97.4 F (36.3 C)   Ht 5\' 2"  (1.575 m)   Wt 220 lb (99.8 kg)   SpO2 99%   BMI 40.24 kg/m   Patient returns to clinic accompanied by daughter with continue complains of symptoms of a URI, possible sinusitis. She visited UC 08/25/23 after being seen and treated in office 08/21/23.  Symptoms continue to include cough described as nonproductive, shortness of breath, sinus pressure, and wheezing. Onset of symptoms was two weeks ago days ago, and has been unchanged since that time. Treatment to date: antihistamines, cough suppressants, decongestants, nasal steroids, and inhalers without relief.  She has a hx of mild intermittent asthma .  Denies fever, chills, N/V.  Has recently traveled across the state with her daughter.      Past Medical History:  Diagnosis Date   Anxiety    Arthritis    Asthma    Cataract    Depression    Gout    Hyperlipidemia    Hypertension    Obese      Allergies  Allergen  Reactions   Molds & Smuts Anaphylaxis   Biaxin [Clarithromycin]     GI upset   Biaxin [Clarithromycin] Other (See Comments)    GI Upset   Prednisone     Was admitted for self harm when on prednisone before   Prednisone Other (See Comments)    Suicidal   Serevent [Salmeterol]     Palpitations   Tequin [Gatifloxacin]     GI upset. thrush   Tequin [Gatifloxacin] Other (See Comments)    GI upset, Thrush   Serevent [Salmeterol] Palpitations    Current  Outpatient Medications on File Prior to Visit  Medication Sig   albuterol (VENTOLIN HFA) 108 (90 Base) MCG/ACT inhaler Inhale 2 puffs into the lungs every 6 (six) hours as needed for wheezing or shortness of breath.   benzonatate (TESSALON) 100 MG capsule Take 1 capsule (100 mg total) by mouth every 8 (eight) hours.   budesonide (PULMICORT) 0.5 MG/2ML nebulizer solution Take 2 mLs (0.5 mg total) by nebulization daily as needed.   buPROPion (WELLBUTRIN XL) 300 MG 24 hr tablet Take 300 mg by mouth daily.   doxycycline (VIBRA-TABS) 100 MG tablet Take 1 tablet (100 mg total) by mouth 2 (two) times daily for 7 days.   EPINEPHrine 0.3 mg/0.3 mL IJ SOAJ injection See admin instructions.   fexofenadine (ALLEGRA) 180 MG tablet Take 180 mg by mouth daily as needed for allergies.   FLUoxetine (PROZAC) 20 MG capsule Take 60 mg by mouth daily.   fluticasone (FLONASE) 50 MCG/ACT nasal spray Place 1 spray into both nostrils daily as needed for allergies or rhinitis.   levocetirizine (XYZAL) 5 MG tablet Take 5 mg by mouth every evening.   lisinopril (ZESTRIL) 20 MG tablet Take 20 mg by mouth daily.   methylphenidate (RITALIN) 10 MG tablet Take 10 mg by mouth 2 (two) times daily.   montelukast (SINGULAIR) 10 MG tablet Take 10 mg by mouth at bedtime.   neomycin-polymyxin b-dexamethasone (MAXITROL) 3.5-10000-0.1 OINT Apply a small amount into the lower eyelid 3 to 4 x / day   NON FORMULARY Inject 1 Dose into the skin once a week. Allergy shots from [facility]. Patient not sure about the name of medicine   pantoprazole (PROTONIX) 40 MG tablet TAKE 1 TABLET BY MOUTH DAILY TO PREVENT INDIGESTION AND HEARTBURN   promethazine-dextromethorphan (PROMETHAZINE-DM) 6.25-15 MG/5ML syrup Take 2.5 mLs by mouth at bedtime as needed for cough.   rosuvastatin (CRESTOR) 40 MG tablet Take 40 mg by mouth daily.   SYMBICORT 160-4.5 MCG/ACT inhaler Inhale 2 puffs into the lungs 2 (two) times daily.   traMADol  (ULTRAM) 50 MG tablet Take 1 tablet (50 mg total) by mouth every 8 (eight) hours as needed for moderate pain.   traZODone (DESYREL) 50 MG tablet Take 1 tablet (50 mg total) by mouth at bedtime.   VITAMIN D PO Take 10,000 Units by mouth daily.   Current Facility-Administered Medications on File Prior to Visit  Medication   ipratropium-albuterol (DUONEB) 0.5-2.5 (3) MG/3ML nebulizer solution 3 mL    ROS: all negative except what is noted in the HPI.   Physical Exam:  BP (!) 116/50   Pulse 87   Temp (!) 97.4 F (36.3 C)   Ht 5\' 2"  (1.575 m)   Wt 220 lb (99.8 kg)   SpO2 99%   BMI 40.24 kg/m   General Appearance: NAD.  Awake, conversant and cooperative. Eyes: PERRLA, EOMs intact.  Sclera white.  Conjunctiva without erythema.  Sinuses: No frontal/maxillary tenderness.  No nasal discharge. Nares patent.  ENT/Mouth: Ext aud canals clear.  Bilateral TMs w/DOL and without erythema or bulging. Hearing intact.  Posterior pharynx without swelling or exudate.  Tonsils without swelling or erythema.  Neck: Supple.  No masses, nodules or thyromegaly. Respiratory: Effort is regular with non-labored breathing. Breath sounds are equal bilaterally with scattered rhonchi upon expiration.  Cardio: RRR with no MRGs. Brisk peripheral pulses without edema.  Abdomen: Active BS in all four quadrants.  Soft and non-tender without guarding, rebound tenderness, hernias or masses. Lymphatics: Non tender without lymphadenopathy.  Musculoskeletal: Full ROM, 5/5 strength, normal ambulation.  No clubbing or cyanosis. Skin: Appropriate color for ethnicity. Warm without rashes, lesions, ecchymosis, ulcers.  Neuro: CN II-XII grossly normal. Normal muscle tone without cerebellar symptoms and intact sensation.   Psych: AO X 3,  appropriate mood and affect, insight and judgment.     Adela Glimpse, NP 1:53 PM [facility]

## 2023-08-28 ENCOUNTER — Encounter: Payer: Self-pay | Admitting: Pulmonary Disease

## 2023-08-28 ENCOUNTER — Ambulatory Visit: Payer: Medicare HMO | Admitting: Pulmonary Disease

## 2023-08-28 ENCOUNTER — Encounter: Payer: Self-pay | Admitting: Nurse Practitioner

## 2023-08-28 VITALS — BP 154/80 | HR 98 | Temp 98.0°F | Ht 62.0 in | Wt 218.0 lb

## 2023-08-28 DIAGNOSIS — J4541 Moderate persistent asthma with (acute) exacerbation: Secondary | ICD-10-CM | POA: Diagnosis not present

## 2023-08-28 NOTE — Progress Notes (Signed)
Haley White    [id number]    03/10/1956  Primary Care Physician:McKeown, Chrissie Noa, MD  Referring Physician: Lucky Cowboy, MD [address] [place],  [place] [address]  Chief complaint: Acute visit for asthma exacerbation  HPI: 67 y.o. who  has a past medical history of Anxiety, Arthritis, Asthma, Cataract, Depression, Gout, Hyperlipidemia, Hypertension, and Obese.   Discussed the use of AI scribe software for clinical note transcription with the patient, who gave verbal consent to proceed.  The patient, with a history of asthma and sleep apnea, presents with chest tightness and difficulty breathing. The symptoms started with a cold, which quickly progressed to her chest. The patient reports that her chest feels very tight and it has been affecting her breathing and ability to talk. Over the past week despite completing a course of doxycycline prescribed by her primary care provider's PA, her symptoms have not improved. She was seen at an urgent care center where she received a steroid shot, and was subsequently prescribed a tapering course of Decadron by her primary care provider's office. The patient reports feeling slightly better after starting the Decadron yestterday, but her symptoms have not fully resolved.   She had a chest x-ray yesterday.  The final read is pending but by my review there is bronchial thickening but no acute infiltrate.  COVID testing on the 17th was negative.   Outpatient Encounter Medications as of 08/28/2023  Medication Sig   albuterol (VENTOLIN HFA) 108 (90 Base) MCG/ACT inhaler Inhale 2 puffs into the lungs every 6 (six) hours as needed for wheezing or shortness of breath.   benzonatate (TESSALON) 100 MG capsule Take 1 capsule (100 mg total) by mouth every 8 (eight) hours.   budesonide (PULMICORT) 0.5 MG/2ML nebulizer solution Take 2 mLs (0.5 mg total) by nebulization daily as needed.   buPROPion (WELLBUTRIN XL) 300 MG 24 hr  tablet Take 300 mg by mouth daily.   dexamethasone (DECADRON) 4 MG tablet Take 1 tab 3 x /day for 2 days, then 2 x /day for 2  Days,  then 1 tab daily   doxycycline (VIBRA-TABS) 100 MG tablet Take 1 tablet (100 mg total) by mouth 2 (two) times daily for 7 days.   EPINEPHrine 0.3 mg/0.3 mL IJ SOAJ injection See admin instructions.   fexofenadine (ALLEGRA) 180 MG tablet Take 180 mg by mouth daily as needed for allergies.   FLUoxetine (PROZAC) 20 MG capsule Take 60 mg by mouth daily.   fluticasone (FLONASE) 50 MCG/ACT nasal spray Place 1 spray into both nostrils daily as needed for allergies or rhinitis.   levocetirizine (XYZAL) 5 MG tablet Take 5 mg by mouth every evening.   lisinopril (ZESTRIL) 20 MG tablet Take 20 mg by mouth daily.   methylphenidate (RITALIN) 10 MG tablet Take 10 mg by mouth 2 (two) times daily.   montelukast (SINGULAIR) 10 MG tablet Take 10 mg by mouth at bedtime.   NON FORMULARY Inject 1 Dose into the skin once a week. Allergy shots from [facility]. Patient not sure about the name of medicine   pantoprazole (PROTONIX) 40 MG tablet TAKE 1 TABLET BY MOUTH DAILY TO PREVENT INDIGESTION AND HEARTBURN   promethazine-dextromethorphan (PROMETHAZINE-DM) 6.25-15 MG/5ML syrup Take 2.5 mLs by mouth at bedtime as needed for cough.   rosuvastatin (CRESTOR) 40 MG tablet Take 40 mg by mouth daily.   SYMBICORT 160-4.5 MCG/ACT inhaler Inhale 2 puffs into the lungs 2 (  two) times daily.   traZODone (DESYREL) 50 MG tablet Take 1 tablet (50 mg total) by mouth at bedtime.   VITAMIN D PO Take 10,000 Units by mouth daily.   neomycin-polymyxin b-dexamethasone (MAXITROL) 3.5-10000-0.1 OINT Apply a small amount into the lower eyelid 3 to 4 x / day   traMADol (ULTRAM) 50 MG tablet Take 1 tablet (50 mg total) by mouth every 8 (eight) hours as needed for moderate pain.   Facility-Administered Encounter Medications as of 08/28/2023  Medication   ipratropium-albuterol (DUONEB) 0.5-2.5  (3) MG/3ML nebulizer solution 3 mL    Allergies as of 08/28/2023 - Review Complete 08/28/2023  Allergen Reaction Noted   Molds & smuts Anaphylaxis 12/09/2019   Biaxin [clarithromycin]  12/31/2013   Biaxin [clarithromycin] Other (See Comments) 06/02/2023   Prednisone  06/03/2022   Prednisone Other (See Comments) 06/02/2023   Serevent [salmeterol]  12/31/2013   Tequin [gatifloxacin]  12/31/2013   Tequin [gatifloxacin] Other (See Comments) 06/02/2023   Serevent [salmeterol] Palpitations 06/02/2023    Past Medical History:  Diagnosis Date   Anxiety    Arthritis    Asthma    Cataract    Depression    Gout    Hyperlipidemia    Hypertension    Obese     Past Surgical History:  Procedure Laterality Date   ANKLE SURGERY Left    APPENDECTOMY  1984   APPENDECTOMY     CATARACT EXTRACTION W/ INTRAOCULAR LENS  IMPLANT, BILATERAL  2014   CESAREAN SECTION  1984, 1987, 1994   X3   CESAREAN SECTION     x3   COLONOSCOPY  11/16/2013   w/Brodie    EXTERNAL FIXATION LEG Left 06/04/2023   Procedure: EXTERNAL FIXATION LEG;  Surgeon: Netta Cedars, MD;  Location: [facility];  Service: Orthopedics;  Laterality: Left;   EYE SURGERY Bilateral    lasix   FOOT FRACTURE SURGERY Left    FOOT SURGERY Left 2002   FRACTURE SURGERY Left    left ankle   KNEE SURGERY Left 1998   ORIF ANKLE FRACTURE Left 06/04/2023   Procedure: OPEN REDUCTION INTERNAL FIXATION (ORIF) ANKLE FRACTURE vs EX FIX;  Surgeon: Netta Cedars, MD;  Location: [facility];  Service: Orthopedics;  Laterality: Left;   ROTATOR CUFF REPAIR Right 2007   shoulder     torn rotator cuff   TUBAL LIGATION  1994   TUBAL LIGATION      Family History  Problem Relation Age of Onset   Dementia Father    Anxiety disorder Sister    Colon cancer Neg Hx    Colon polyps Neg Hx    Esophageal cancer Neg Hx    Stomach cancer Neg Hx    Rectal cancer Neg Hx     Social History   Socioeconomic History   Marital status: Divorced    Spouse  name: Not on file   Number of children: Not on file   Years of education: Not on file   Highest education level: Not on file  Occupational History   Not on file  Tobacco Use   Smoking status: Never   Smokeless tobacco: Never  Vaping Use   Vaping status: Never Used  Substance and Sexual Activity   Alcohol use: Yes    Comment: occasionally   Drug use: Never    Comment: 06/03/2022-gummies   Sexual activity: Yes    Birth control/protection: Post-menopausal, Surgical  Other Topics Concern   Not on file  Social History Narrative   **  Merged History Encounter **       Social Determinants of Health   Financial Resource Strain: Not on file  Food Insecurity: Patient Declined (06/03/2023)   Hunger Vital Sign    Worried About Running Out of Food in the Last Year: Patient declined    Ran Out of Food in the Last Year: Patient declined  Transportation Needs: No Transportation Needs (06/02/2023)   PRAPARE - Administrator, Civil Service (Medical): No    Lack of Transportation (Non-Medical): No  Physical Activity: Not on file  Stress: Not on file  Social Connections: Not on file  Intimate Partner Violence: Not At Risk (06/03/2023)   Humiliation, Afraid, Rape, and Kick questionnaire    Fear of Current or Ex-Partner: No    Emotionally Abused: No    Physically Abused: No    Sexually Abused: No    Review of systems: Review of Systems  Constitutional: Negative for fever and chills.  HENT: Negative.   Eyes: Negative for blurred vision.  Respiratory: as per HPI  Cardiovascular: Negative for chest pain and palpitations.  Gastrointestinal: Negative for vomiting, diarrhea, blood per rectum. Genitourinary: Negative for dysuria, urgency, frequency and hematuria.  Musculoskeletal: Negative for myalgias, back pain and joint pain.  Skin: Negative for itching and rash.  Neurological: Negative for dizziness, tremors, focal weakness, seizures and loss of consciousness.   Endo/Heme/Allergies: Negative for environmental allergies.  Psychiatric/Behavioral: Negative for depression, suicidal ideas and hallucinations.  All other systems reviewed and are negative.  Physical Exam: Blood pressure (!) 148/80, pulse 98, temperature 98 F (36.7 C), temperature source Temporal, height 5\' 2"  (1.575 m), weight 218 lb (98.9 kg), SpO2 98%. Gen:      No acute distress HEENT:  EOMI, sclera anicteric Neck:     No masses; no thyromegaly Lungs:    Clear to auscultation bilaterally; normal respiratory effort CV:         Regular rate and rhythm; no murmurs Abd:      + bowel sounds; soft, non-tender; no palpable masses, no distension Ext:    No edema; adequate peripheral perfusion Skin:      Warm and dry; no rash Neuro: alert and oriented x 3 Psych: normal mood and affect  Data Reviewed: Imaging: Chest x-ray 08/27/2023-radiology read is pending.  By my review there is no acute infiltrate.  There is bronchial wall thickening.  PFTs:  Labs:  Assessment and Plan Asthma exacerbation Recent upper respiratory infection progressed to chest tightness and difficulty breathing. Recent chest X-ray showed no pneumonia, but possible bronchial inflammation. Currently on a 6-day course of Decadron (started yesterday), and reported feeling better this morning. No wheezing on examination today. -Continue Decadron as prescribed. -If symptoms do not improve or worsen, contact the office for possible additional antibiotics or steroids.  Chronic Asthma On Symbicort, Pulmicort nebulizer, and Ventolin (rescue inhaler).  -Continue current asthma medications as prescribed.  Recommendations: Continue current management Follow-up with Dr. Lynann Bologna MD [facility] 08/28/2023, 9:07 AM  CC: Lucky Cowboy, MD

## 2023-08-28 NOTE — Patient Instructions (Signed)
VISIT SUMMARY:  You were seen today for chest tightness and difficulty breathing, which started after a cold and have not fully resolved despite initial treatments. You have a history of asthma and sleep apnea, and you recently received a steroid shot and started a course of Decadron.   YOUR PLAN:  -ASTHMA EXACERBATION: An asthma exacerbation is a worsening of asthma symptoms, often triggered by an infection or other irritants. You are currently on a 6-day course of Decadron, a steroid, and you should continue taking it as prescribed. If your symptoms do not improve or worsen, please contact our office for further evaluation and possible additional treatment.  -CHRONIC ASTHMA: Chronic asthma is a long-term condition that causes inflammation and narrowing of the airways. You are currently using Symbicort, Pulmicort nebulizer, and Ventolin as a rescue inhaler. Please continue taking your asthma medications as prescribed to manage your symptoms.   INSTRUCTIONS:  Please continue taking Decadron as prescribed. If your symptoms do not improve or worsen, contact our office for further evaluation.

## 2023-08-29 DIAGNOSIS — M47896 Other spondylosis, lumbar region: Secondary | ICD-10-CM | POA: Diagnosis not present

## 2023-08-29 DIAGNOSIS — M792 Neuralgia and neuritis, unspecified: Secondary | ICD-10-CM | POA: Diagnosis not present

## 2023-09-02 ENCOUNTER — Telehealth: Payer: Self-pay

## 2023-09-02 DIAGNOSIS — Z79891 Long term (current) use of opiate analgesic: Secondary | ICD-10-CM | POA: Diagnosis not present

## 2023-09-02 DIAGNOSIS — K219 Gastro-esophageal reflux disease without esophagitis: Secondary | ICD-10-CM | POA: Diagnosis not present

## 2023-09-02 DIAGNOSIS — M6281 Muscle weakness (generalized): Secondary | ICD-10-CM | POA: Diagnosis not present

## 2023-09-02 DIAGNOSIS — I10 Essential (primary) hypertension: Secondary | ICD-10-CM | POA: Diagnosis not present

## 2023-09-02 DIAGNOSIS — F32A Depression, unspecified: Secondary | ICD-10-CM | POA: Diagnosis not present

## 2023-09-02 DIAGNOSIS — J449 Chronic obstructive pulmonary disease, unspecified: Secondary | ICD-10-CM | POA: Diagnosis not present

## 2023-09-02 DIAGNOSIS — W19XXXD Unspecified fall, subsequent encounter: Secondary | ICD-10-CM | POA: Diagnosis not present

## 2023-09-02 DIAGNOSIS — F419 Anxiety disorder, unspecified: Secondary | ICD-10-CM | POA: Diagnosis not present

## 2023-09-02 DIAGNOSIS — Z9181 History of falling: Secondary | ICD-10-CM | POA: Diagnosis not present

## 2023-09-02 DIAGNOSIS — E785 Hyperlipidemia, unspecified: Secondary | ICD-10-CM | POA: Diagnosis not present

## 2023-09-02 DIAGNOSIS — S82852D Displaced trimalleolar fracture of left lower leg, subsequent encounter for closed fracture with routine healing: Secondary | ICD-10-CM | POA: Diagnosis not present

## 2023-09-02 NOTE — Telephone Encounter (Signed)
Patient currently taking Lisinopril, 20mg . Instructed to take BID

## 2023-09-02 NOTE — Telephone Encounter (Signed)
Patient very concerned about her blood pressure being elevated. BP readings have been in the 170's/90's. Please advise.  FYI: Just finished her Dexamethasone today.

## 2023-09-07 DIAGNOSIS — G5601 Carpal tunnel syndrome, right upper limb: Secondary | ICD-10-CM | POA: Diagnosis not present

## 2023-09-12 DIAGNOSIS — J449 Chronic obstructive pulmonary disease, unspecified: Secondary | ICD-10-CM | POA: Diagnosis not present

## 2023-09-12 DIAGNOSIS — F331 Major depressive disorder, recurrent, moderate: Secondary | ICD-10-CM | POA: Diagnosis not present

## 2023-09-12 DIAGNOSIS — F32A Depression, unspecified: Secondary | ICD-10-CM | POA: Diagnosis not present

## 2023-09-12 DIAGNOSIS — W19XXXD Unspecified fall, subsequent encounter: Secondary | ICD-10-CM | POA: Diagnosis not present

## 2023-09-12 DIAGNOSIS — I10 Essential (primary) hypertension: Secondary | ICD-10-CM | POA: Diagnosis not present

## 2023-09-12 DIAGNOSIS — Z9181 History of falling: Secondary | ICD-10-CM | POA: Diagnosis not present

## 2023-09-12 DIAGNOSIS — F419 Anxiety disorder, unspecified: Secondary | ICD-10-CM | POA: Diagnosis not present

## 2023-09-12 DIAGNOSIS — Z79891 Long term (current) use of opiate analgesic: Secondary | ICD-10-CM | POA: Diagnosis not present

## 2023-09-12 DIAGNOSIS — M6281 Muscle weakness (generalized): Secondary | ICD-10-CM | POA: Diagnosis not present

## 2023-09-12 DIAGNOSIS — S82852D Displaced trimalleolar fracture of left lower leg, subsequent encounter for closed fracture with routine healing: Secondary | ICD-10-CM | POA: Diagnosis not present

## 2023-09-12 DIAGNOSIS — E785 Hyperlipidemia, unspecified: Secondary | ICD-10-CM | POA: Diagnosis not present

## 2023-09-12 DIAGNOSIS — K219 Gastro-esophageal reflux disease without esophagitis: Secondary | ICD-10-CM | POA: Diagnosis not present

## 2023-09-14 DIAGNOSIS — G4733 Obstructive sleep apnea (adult) (pediatric): Secondary | ICD-10-CM | POA: Diagnosis not present

## 2023-09-16 DIAGNOSIS — Z79891 Long term (current) use of opiate analgesic: Secondary | ICD-10-CM | POA: Diagnosis not present

## 2023-09-16 DIAGNOSIS — F32A Depression, unspecified: Secondary | ICD-10-CM | POA: Diagnosis not present

## 2023-09-16 DIAGNOSIS — M6281 Muscle weakness (generalized): Secondary | ICD-10-CM | POA: Diagnosis not present

## 2023-09-16 DIAGNOSIS — I10 Essential (primary) hypertension: Secondary | ICD-10-CM | POA: Diagnosis not present

## 2023-09-16 DIAGNOSIS — K219 Gastro-esophageal reflux disease without esophagitis: Secondary | ICD-10-CM | POA: Diagnosis not present

## 2023-09-16 DIAGNOSIS — W19XXXD Unspecified fall, subsequent encounter: Secondary | ICD-10-CM | POA: Diagnosis not present

## 2023-09-16 DIAGNOSIS — S82852D Displaced trimalleolar fracture of left lower leg, subsequent encounter for closed fracture with routine healing: Secondary | ICD-10-CM | POA: Diagnosis not present

## 2023-09-16 DIAGNOSIS — Z9181 History of falling: Secondary | ICD-10-CM | POA: Diagnosis not present

## 2023-09-16 DIAGNOSIS — F419 Anxiety disorder, unspecified: Secondary | ICD-10-CM | POA: Diagnosis not present

## 2023-09-16 DIAGNOSIS — E785 Hyperlipidemia, unspecified: Secondary | ICD-10-CM | POA: Diagnosis not present

## 2023-09-16 DIAGNOSIS — J449 Chronic obstructive pulmonary disease, unspecified: Secondary | ICD-10-CM | POA: Diagnosis not present

## 2023-09-17 ENCOUNTER — Telehealth: Payer: Self-pay

## 2023-09-17 ENCOUNTER — Encounter: Payer: Self-pay | Admitting: Nurse Practitioner

## 2023-09-17 NOTE — Telephone Encounter (Signed)
-----   Message from [facility] sent at 09/17/2023 12:52 PM EST ----- Regarding: Elevated BP Received a home health notification from [facility] stating that MD was notified for a BP of 160/82.  Dr. Oneta Rack reviewed and said that he did not remember following through, therefore can you contact her and confirm BP readings (average) and how she is taking the Lisinopril.

## 2023-09-17 NOTE — Telephone Encounter (Signed)
Patient states that she just picked up the Lisinopril yesterday and hasn't started it yet. I asked her to MyChart message [place] back the sig on the Rx. Also will send her readings to adjust meds accordingly.

## 2023-09-18 DIAGNOSIS — M792 Neuralgia and neuritis, unspecified: Secondary | ICD-10-CM | POA: Diagnosis not present

## 2023-09-18 DIAGNOSIS — M47896 Other spondylosis, lumbar region: Secondary | ICD-10-CM | POA: Diagnosis not present

## 2023-09-18 DIAGNOSIS — M797 Fibromyalgia: Secondary | ICD-10-CM | POA: Diagnosis not present

## 2023-09-18 NOTE — Telephone Encounter (Signed)
Message for you somehow sent to me

## 2023-09-19 DIAGNOSIS — S82852D Displaced trimalleolar fracture of left lower leg, subsequent encounter for closed fracture with routine healing: Secondary | ICD-10-CM | POA: Diagnosis not present

## 2023-09-23 DIAGNOSIS — K219 Gastro-esophageal reflux disease without esophagitis: Secondary | ICD-10-CM | POA: Diagnosis not present

## 2023-09-23 DIAGNOSIS — M6281 Muscle weakness (generalized): Secondary | ICD-10-CM | POA: Diagnosis not present

## 2023-09-23 DIAGNOSIS — E785 Hyperlipidemia, unspecified: Secondary | ICD-10-CM | POA: Diagnosis not present

## 2023-09-23 DIAGNOSIS — F32A Depression, unspecified: Secondary | ICD-10-CM | POA: Diagnosis not present

## 2023-09-23 DIAGNOSIS — I10 Essential (primary) hypertension: Secondary | ICD-10-CM | POA: Diagnosis not present

## 2023-09-23 DIAGNOSIS — W19XXXD Unspecified fall, subsequent encounter: Secondary | ICD-10-CM | POA: Diagnosis not present

## 2023-09-23 DIAGNOSIS — Z79891 Long term (current) use of opiate analgesic: Secondary | ICD-10-CM | POA: Diagnosis not present

## 2023-09-23 DIAGNOSIS — F419 Anxiety disorder, unspecified: Secondary | ICD-10-CM | POA: Diagnosis not present

## 2023-09-23 DIAGNOSIS — S82852D Displaced trimalleolar fracture of left lower leg, subsequent encounter for closed fracture with routine healing: Secondary | ICD-10-CM | POA: Diagnosis not present

## 2023-09-23 DIAGNOSIS — J449 Chronic obstructive pulmonary disease, unspecified: Secondary | ICD-10-CM | POA: Diagnosis not present

## 2023-09-23 DIAGNOSIS — Z9181 History of falling: Secondary | ICD-10-CM | POA: Diagnosis not present

## 2023-09-24 ENCOUNTER — Encounter: Payer: Self-pay | Admitting: Internal Medicine

## 2023-09-24 DIAGNOSIS — F331 Major depressive disorder, recurrent, moderate: Secondary | ICD-10-CM | POA: Diagnosis not present

## 2023-09-26 NOTE — Progress Notes (Deleted)
Assessment and Plan:  There are no diagnoses linked to this encounter.    Further disposition pending results of labs. Discussed med's effects and SE's.   Over 30 minutes of exam, counseling, chart review, and critical decision making was performed.   Future Appointments  Date Time Provider Department Center  09/29/2023  2:30 PM Raynelle Dick, NP [facility] None  10/24/2023 10:30 AM Adela Glimpse, NP [facility] None  11/13/2023 11:00 AM Charlott Holler, MD [facility] None  01/22/2024 11:00 AM Lucky Cowboy, MD [facility] None  03/01/2024 12:00 PM [facility] DX DEXA 1 [facility]  03/01/2024 12:30 PM [facility] MM 2 [facility]  07/23/2024 10:30 AM Adela Glimpse, NP [facility] None  08/16/2024  3:45 PM McCue, Shanda Bumps, NP [facility] None    ------------------------------------------------------------------------------------------------------------------   HPI There were no vitals taken for this visit. 67 y.o.female presents for  Past Medical History:  Diagnosis Date   Anxiety    Arthritis    Asthma    Cataract    Depression    Gout    Hyperlipidemia    Hypertension    Obese      Allergies  Allergen Reactions   Molds & Smuts Anaphylaxis   Biaxin [Clarithromycin]     GI upset   Biaxin [Clarithromycin] Other (See Comments)    GI Upset   Prednisone     Was admitted for self harm when on prednisone before   Prednisone Other (See Comments)    Suicidal   Serevent [Salmeterol]     Palpitations   Tequin [Gatifloxacin]     GI upset. thrush   Tequin [Gatifloxacin] Other (See Comments)    GI upset, Thrush   Serevent [Salmeterol] Palpitations    Current Outpatient Medications on File Prior to Visit  Medication Sig   albuterol (VENTOLIN HFA) 108 (90 Base) MCG/ACT inhaler Inhale 2 puffs into the lungs every 6 (six) hours as needed for wheezing or shortness of breath.   benzonatate (TESSALON) 100 MG capsule Take 1 capsule (100 mg total) by mouth every 8  (eight) hours.   budesonide (PULMICORT) 0.5 MG/2ML nebulizer solution Take 2 mLs (0.5 mg total) by nebulization daily as needed.   buPROPion (WELLBUTRIN XL) 300 MG 24 hr tablet Take 300 mg by mouth daily.   dexamethasone (DECADRON) 4 MG tablet Take 1 tab 3 x /day for 2 days, then 2 x /day for 2  Days,  then 1 tab daily   EPINEPHrine 0.3 mg/0.3 mL IJ SOAJ injection See admin instructions.   fexofenadine (ALLEGRA) 180 MG tablet Take 180 mg by mouth daily as needed for allergies.   FLUoxetine (PROZAC) 20 MG capsule Take 60 mg by mouth daily.   fluticasone (FLONASE) 50 MCG/ACT nasal spray Place 1 spray into both nostrils daily as needed for allergies or rhinitis.   levocetirizine (XYZAL) 5 MG tablet Take 5 mg by mouth every evening.   lisinopril (ZESTRIL) 20 MG tablet Take 20 mg by mouth daily.   methylphenidate (RITALIN) 10 MG tablet Take 10 mg by mouth 2 (two) times daily.   montelukast (SINGULAIR) 10 MG tablet Take 10 mg by mouth at bedtime.   neomycin-polymyxin b-dexamethasone (MAXITROL) 3.5-10000-0.1 OINT Apply a small amount into the lower eyelid 3 to 4 x / day   NON FORMULARY Inject 1 Dose into the skin once a week. Allergy shots from [facility] at [place]. Patient not sure about the name of medicine   pantoprazole (PROTONIX) 40 MG tablet TAKE 1 TABLET BY  MOUTH DAILY TO PREVENT INDIGESTION AND HEARTBURN   promethazine-dextromethorphan (PROMETHAZINE-DM) 6.25-15 MG/5ML syrup Take 2.5 mLs by mouth at bedtime as needed for cough.   rosuvastatin (CRESTOR) 40 MG tablet Take 40 mg by mouth daily.   SYMBICORT 160-4.5 MCG/ACT inhaler Inhale 2 puffs into the lungs 2 (two) times daily.   traMADol (ULTRAM) 50 MG tablet Take 1 tablet (50 mg total) by mouth every 8 (eight) hours as needed for moderate pain.   traZODone (DESYREL) 50 MG tablet Take 1 tablet (50 mg total) by mouth at bedtime.   VITAMIN D PO Take 10,000 Units by mouth daily.   Current Facility-Administered Medications on File Prior  to Visit  Medication   ipratropium-albuterol (DUONEB) 0.5-2.5 (3) MG/3ML nebulizer solution 3 mL    ROS: all negative except above.   Physical Exam:  There were no vitals taken for this visit.  General Appearance: Well nourished, in no apparent distress. Eyes: PERRLA, EOMs, conjunctiva no swelling or erythema Sinuses: No Frontal/maxillary tenderness ENT/Mouth: Ext aud canals clear, TMs without erythema, bulging. No erythema, swelling, or exudate on post pharynx.  Tonsils not swollen or erythematous. Hearing normal.  Neck: Supple, thyroid normal.  Respiratory: Respiratory effort normal, BS equal bilaterally without rales, rhonchi, wheezing or stridor.  Cardio: RRR with no MRGs. Brisk peripheral pulses without edema.  Abdomen: Soft, + BS.  Non tender, no guarding, rebound, hernias, masses. Lymphatics: Non tender without lymphadenopathy.  Musculoskeletal: Full ROM, 5/5 strength, normal gait.  Skin: Warm, dry without rashes, lesions, ecchymosis.  Neuro: Cranial nerves intact. Normal muscle tone, no cerebellar symptoms. Sensation intact.  Psych: Awake and oriented X 3, normal affect, Insight and Judgment appropriate.     Raynelle Dick, NP 11:03 AM [facility]

## 2023-09-29 ENCOUNTER — Ambulatory Visit: Payer: Medicare HMO | Admitting: Nurse Practitioner

## 2023-09-29 DIAGNOSIS — Z9181 History of falling: Secondary | ICD-10-CM | POA: Diagnosis not present

## 2023-09-29 DIAGNOSIS — E785 Hyperlipidemia, unspecified: Secondary | ICD-10-CM | POA: Diagnosis not present

## 2023-09-29 DIAGNOSIS — W19XXXD Unspecified fall, subsequent encounter: Secondary | ICD-10-CM | POA: Diagnosis not present

## 2023-09-29 DIAGNOSIS — K219 Gastro-esophageal reflux disease without esophagitis: Secondary | ICD-10-CM | POA: Diagnosis not present

## 2023-09-29 DIAGNOSIS — M6281 Muscle weakness (generalized): Secondary | ICD-10-CM | POA: Diagnosis not present

## 2023-09-29 DIAGNOSIS — F32A Depression, unspecified: Secondary | ICD-10-CM | POA: Diagnosis not present

## 2023-09-29 DIAGNOSIS — Z79891 Long term (current) use of opiate analgesic: Secondary | ICD-10-CM | POA: Diagnosis not present

## 2023-09-29 DIAGNOSIS — I10 Essential (primary) hypertension: Secondary | ICD-10-CM | POA: Diagnosis not present

## 2023-09-29 DIAGNOSIS — S82852D Displaced trimalleolar fracture of left lower leg, subsequent encounter for closed fracture with routine healing: Secondary | ICD-10-CM | POA: Diagnosis not present

## 2023-09-29 DIAGNOSIS — J449 Chronic obstructive pulmonary disease, unspecified: Secondary | ICD-10-CM | POA: Diagnosis not present

## 2023-09-29 DIAGNOSIS — F419 Anxiety disorder, unspecified: Secondary | ICD-10-CM | POA: Diagnosis not present

## 2023-10-06 DIAGNOSIS — F32A Depression, unspecified: Secondary | ICD-10-CM | POA: Diagnosis not present

## 2023-10-06 DIAGNOSIS — M6281 Muscle weakness (generalized): Secondary | ICD-10-CM | POA: Diagnosis not present

## 2023-10-06 DIAGNOSIS — F419 Anxiety disorder, unspecified: Secondary | ICD-10-CM | POA: Diagnosis not present

## 2023-10-06 DIAGNOSIS — Z9181 History of falling: Secondary | ICD-10-CM | POA: Diagnosis not present

## 2023-10-06 DIAGNOSIS — I10 Essential (primary) hypertension: Secondary | ICD-10-CM | POA: Diagnosis not present

## 2023-10-06 DIAGNOSIS — S82852D Displaced trimalleolar fracture of left lower leg, subsequent encounter for closed fracture with routine healing: Secondary | ICD-10-CM | POA: Diagnosis not present

## 2023-10-06 DIAGNOSIS — W19XXXD Unspecified fall, subsequent encounter: Secondary | ICD-10-CM | POA: Diagnosis not present

## 2023-10-06 DIAGNOSIS — Z79891 Long term (current) use of opiate analgesic: Secondary | ICD-10-CM | POA: Diagnosis not present

## 2023-10-06 DIAGNOSIS — E785 Hyperlipidemia, unspecified: Secondary | ICD-10-CM | POA: Diagnosis not present

## 2023-10-06 DIAGNOSIS — K219 Gastro-esophageal reflux disease without esophagitis: Secondary | ICD-10-CM | POA: Diagnosis not present

## 2023-10-06 DIAGNOSIS — J449 Chronic obstructive pulmonary disease, unspecified: Secondary | ICD-10-CM | POA: Diagnosis not present

## 2023-10-07 DIAGNOSIS — F331 Major depressive disorder, recurrent, moderate: Secondary | ICD-10-CM | POA: Diagnosis not present

## 2023-10-09 ENCOUNTER — Ambulatory Visit (HOSPITAL_BASED_OUTPATIENT_CLINIC_OR_DEPARTMENT_OTHER): Payer: Medicare HMO | Admitting: Physical Therapy

## 2023-10-09 DIAGNOSIS — M79645 Pain in left finger(s): Secondary | ICD-10-CM | POA: Diagnosis not present

## 2023-10-14 DIAGNOSIS — G4733 Obstructive sleep apnea (adult) (pediatric): Secondary | ICD-10-CM | POA: Diagnosis not present

## 2023-10-16 DIAGNOSIS — M797 Fibromyalgia: Secondary | ICD-10-CM | POA: Diagnosis not present

## 2023-10-16 DIAGNOSIS — M47896 Other spondylosis, lumbar region: Secondary | ICD-10-CM | POA: Diagnosis not present

## 2023-10-16 DIAGNOSIS — M792 Neuralgia and neuritis, unspecified: Secondary | ICD-10-CM | POA: Diagnosis not present

## 2023-10-17 DIAGNOSIS — K219 Gastro-esophageal reflux disease without esophagitis: Secondary | ICD-10-CM | POA: Diagnosis not present

## 2023-10-17 DIAGNOSIS — J449 Chronic obstructive pulmonary disease, unspecified: Secondary | ICD-10-CM | POA: Diagnosis not present

## 2023-10-17 DIAGNOSIS — Z79891 Long term (current) use of opiate analgesic: Secondary | ICD-10-CM | POA: Diagnosis not present

## 2023-10-17 DIAGNOSIS — S82852D Displaced trimalleolar fracture of left lower leg, subsequent encounter for closed fracture with routine healing: Secondary | ICD-10-CM | POA: Diagnosis not present

## 2023-10-17 DIAGNOSIS — E785 Hyperlipidemia, unspecified: Secondary | ICD-10-CM | POA: Diagnosis not present

## 2023-10-17 DIAGNOSIS — I10 Essential (primary) hypertension: Secondary | ICD-10-CM | POA: Diagnosis not present

## 2023-10-17 DIAGNOSIS — F419 Anxiety disorder, unspecified: Secondary | ICD-10-CM | POA: Diagnosis not present

## 2023-10-17 DIAGNOSIS — Z9181 History of falling: Secondary | ICD-10-CM | POA: Diagnosis not present

## 2023-10-17 DIAGNOSIS — W19XXXD Unspecified fall, subsequent encounter: Secondary | ICD-10-CM | POA: Diagnosis not present

## 2023-10-17 DIAGNOSIS — M6281 Muscle weakness (generalized): Secondary | ICD-10-CM | POA: Diagnosis not present

## 2023-10-17 DIAGNOSIS — F32A Depression, unspecified: Secondary | ICD-10-CM | POA: Diagnosis not present

## 2023-10-20 DIAGNOSIS — S82852D Displaced trimalleolar fracture of left lower leg, subsequent encounter for closed fracture with routine healing: Secondary | ICD-10-CM | POA: Diagnosis not present

## 2023-10-20 DIAGNOSIS — M6281 Muscle weakness (generalized): Secondary | ICD-10-CM | POA: Diagnosis not present

## 2023-10-20 DIAGNOSIS — W19XXXD Unspecified fall, subsequent encounter: Secondary | ICD-10-CM | POA: Diagnosis not present

## 2023-10-20 DIAGNOSIS — J449 Chronic obstructive pulmonary disease, unspecified: Secondary | ICD-10-CM | POA: Diagnosis not present

## 2023-10-20 DIAGNOSIS — E785 Hyperlipidemia, unspecified: Secondary | ICD-10-CM | POA: Diagnosis not present

## 2023-10-20 DIAGNOSIS — F32A Depression, unspecified: Secondary | ICD-10-CM | POA: Diagnosis not present

## 2023-10-20 DIAGNOSIS — F419 Anxiety disorder, unspecified: Secondary | ICD-10-CM | POA: Diagnosis not present

## 2023-10-20 DIAGNOSIS — I10 Essential (primary) hypertension: Secondary | ICD-10-CM | POA: Diagnosis not present

## 2023-10-20 DIAGNOSIS — K219 Gastro-esophageal reflux disease without esophagitis: Secondary | ICD-10-CM | POA: Diagnosis not present

## 2023-10-20 DIAGNOSIS — Z79891 Long term (current) use of opiate analgesic: Secondary | ICD-10-CM | POA: Diagnosis not present

## 2023-10-20 DIAGNOSIS — Z9181 History of falling: Secondary | ICD-10-CM | POA: Diagnosis not present

## 2023-10-21 DIAGNOSIS — F331 Major depressive disorder, recurrent, moderate: Secondary | ICD-10-CM | POA: Diagnosis not present

## 2023-10-24 ENCOUNTER — Ambulatory Visit (INDEPENDENT_AMBULATORY_CARE_PROVIDER_SITE_OTHER): Payer: Medicare HMO | Admitting: Nurse Practitioner

## 2023-10-24 ENCOUNTER — Encounter: Payer: Self-pay | Admitting: Nurse Practitioner

## 2023-10-24 VITALS — BP 130/74 | HR 89 | Temp 97.8°F | Ht 62.0 in | Wt 224.0 lb

## 2023-10-24 DIAGNOSIS — I10 Essential (primary) hypertension: Secondary | ICD-10-CM | POA: Diagnosis not present

## 2023-10-24 DIAGNOSIS — R7309 Other abnormal glucose: Secondary | ICD-10-CM

## 2023-10-24 DIAGNOSIS — G8929 Other chronic pain: Secondary | ICD-10-CM

## 2023-10-24 DIAGNOSIS — J3089 Other allergic rhinitis: Secondary | ICD-10-CM

## 2023-10-24 DIAGNOSIS — F419 Anxiety disorder, unspecified: Secondary | ICD-10-CM | POA: Diagnosis not present

## 2023-10-24 DIAGNOSIS — S82852D Displaced trimalleolar fracture of left lower leg, subsequent encounter for closed fracture with routine healing: Secondary | ICD-10-CM

## 2023-10-24 DIAGNOSIS — K219 Gastro-esophageal reflux disease without esophagitis: Secondary | ICD-10-CM

## 2023-10-24 DIAGNOSIS — J4530 Mild persistent asthma, uncomplicated: Secondary | ICD-10-CM

## 2023-10-24 DIAGNOSIS — M545 Low back pain, unspecified: Secondary | ICD-10-CM

## 2023-10-24 DIAGNOSIS — E782 Mixed hyperlipidemia: Secondary | ICD-10-CM

## 2023-10-24 DIAGNOSIS — E559 Vitamin D deficiency, unspecified: Secondary | ICD-10-CM | POA: Diagnosis not present

## 2023-10-24 DIAGNOSIS — Z79899 Other long term (current) drug therapy: Secondary | ICD-10-CM | POA: Diagnosis not present

## 2023-10-24 NOTE — Progress Notes (Signed)
FOLLOW UP  Assessment:   Essential hypertension Well controlled with current medications Lisinopril Discussed DASH (Dietary Approaches to Stop Hypertension) DASH diet is lower in sodium than a typical American diet. Cut back on foods that are high in saturated fat, cholesterol, and trans fats. Eat more whole-grain foods, fish, poultry, and nuts Remain active and exercise as tolerated daily.  Monitor BP at home-Call if greater than 130/80.   Hyperlipidemia, mixed Continue Rosuvastatin  Discussed lifestyle modifications. Recommended diet heavy in fruits and veggies, omega 3's. Decrease consumption of animal meats, cheeses, and dairy products. Remain active and exercise as tolerated. Continue to monitor.  Abnormal glucose Education: Reviewed 'ABCs' of diabetes management  Discussed goals to be met and/or maintained include A1C (<7) Blood pressure (<130/80) Cholesterol (LDL <70) Continue Eye Exam yearly  Continue Dental Exam Q6 mo Discussed dietary recommendations Discussed Physical Activity recommendations  Vitamin D deficiency Continue supplement for goal of 60-100 Monitor Vitamin D levels  Non-seasonal allergic rhinitis due to other allergic trigger Daily antihistamine Xyzal  Continue Flonase Avoid triggers  Mild intermittent asthma with status asthmaticus Controlled Continue Albuterol and Symbicort, Montelukast Avoid triggers  Medication management All medications discussed and reviewed in full. All questions and concerns regarding medications addressed.    Chronic bilateral low back pain without sciatica Rest when flared Methocarbamol PRN Continue to monitor  Anxiety Continue medications Bupropion, Fluoxetine  Reviewed relaxation techniques.  Sleep hygiene. Recommended Cognitive Behavioral Therapy (CBT) PRN. Recommended mindfulness meditation and exercise.   Encouraged personality growth wand development through coping techniques and problem-solving  skills. Limit/Decrease/Monitor drug/alcohol intake.    Gastroesophageal reflux disease without esophagitis No suspected reflux complications (Barret/stricture). Lifestyle modification:  wt loss, avoid meals 2-3h before bedtime. Consider eliminating food triggers:  chocolate, caffeine, EtOH, acid/spicy food.  Closed trimalleolar fracture of left ankle with routine healing, subsequent encounter Orthopedics following Continue to monitor  Screening for colon cancer Refer to GI  Screening for breast cancer Mammogram due  Osteopenia Due for DEXA - last 02/2020 T score -2.0 Pursue a combination of weight-bearing exercises and strength training. Patients with severe mobility impairment should be referred for physical therapy. Advised on fall prevention measures including proper lighting in all rooms, removal of area rugs and floor clutter, use of walking devices as deemed appropriate, avoidance of uneven walking surfaces. Smoking cessation and moderate alcohol consumption if applicable Consume 800 to 1000 IU of vitamin D daily with a goal vitamin D serum value of 30 ng/mL or higher. Aim for 1000 to 1200 mg of elemental calcium daily through supplements and/or dietary sources.  Orders Placed This Encounter  Procedures   CBC with Differential/Platelet   COMPLETE METABOLIC PANEL WITH GFR   Lipid panel   Hemoglobin A1c   Notify office for further evaluation and treatment, questions or concerns if any reported s/s fail to improve.   The patient was advised to call back or seek an in-person evaluation if any symptoms worsen or if the condition fails to improve as anticipated.   Further disposition pending results of labs. Discussed med's effects and SE's.    I discussed the assessment and treatment plan with the patient. The patient was provided an opportunity to ask questions and all were answered. The patient agreed with the plan and demonstrated an understanding of the  instructions.  Discussed med's effects and SE's. Screening labs and tests as requested with regular follow-up as recommended.  I provided 30 minutes of face-to-face time during this encounter including counseling, chart review,  and critical decision making was preformed.  Today's Plan of Care is based on a patient-centered health care approach known as shared decision making - the decisions, tests and treatments allow for patient preferences and values to be balanced with clinical evidence.    Future Appointments  Date Time Provider Department Center  10/28/2023 11:15 AM Berlyn, [place], PT [facility]  11/13/2023 11:00 AM Charlott Holler, MD [facility] None  01/22/2024 11:00 AM Lucky Cowboy, MD [facility] None  03/01/2024 12:00 PM [facility] DX DEXA 1 [facility]  03/01/2024 12:30 PM [facility] MM 2 [facility]  07/23/2024 10:30 AM Adela Glimpse, NP [facility] None  08/16/2024  3:45 PM Ihor Austin, NP [facility] None   Subjective:  Haley White is a 67 y.o. female who presents for a 3 month follow up. She has Hypertension; Anxiety; Abnormal glucose; Class 2 severe obesity due to excess calories with serious comorbidity and body mass index (BMI) of 37.0 to 37.9 in adult [facility]); Vitamin D deficiency; Hyperlipidemia, mixed; Suicide attempt by drug ingestion (HCC); Steroid-induced depression; Major depressive disorder, recurrent, severe without psychotic features (HCC); Pseudogout; Asthma; Low back pain; Lumbosacral spondylosis without myelopathy; Aortic atherosclerosis (HCC) by Chest CT scan on 02/02/2018 ; Allergic rhinitis; Gastro-esophageal reflux disease without esophagitis; Mild persistent asthma, uncomplicated; Acute pulmonary embolism (HCC); Sleep-related hypoxia; At risk for obstructive sleep apnea; Trimalleolar fracture of left ankle; Acute left ankle pain; Ankle fracture; and OSA on CPAP on their problem list.  Overall she reports feeling well.    She presented to the ED on  06/02/23 with acute left trimalleolar ankle fracture after tripping while ambulating at home, resulting in the inability to bear weight.  She had an ORIF with external fixation on 06/04/23.  She is continuing to follow with Orthopedics and using a scooter to move around.  She is in a support boot.  She takes a muscle relaxer for improvement of chronic low back pain.  Prior to that she was seen in the ER 04/14/23 for a bee sting reaction. Was concerned for left upper arm swelling and cellulitis. She has completed treatment and now resolved.  Has Epi Pen on hand. She has a hx of asthma and allergies.  Currently well controlled with inhalers and oral agents, Albuterol, Symbicort, Montelukast, Xyzal, Flonase.    She is continuing to take Bupropion and Fluoxetine for management of anxiety. Reports effective. Mood is stable.      BMI is Body mass index is 40.97 kg/m., she has been working on diet and exercise. Wt Readings from Last 3 Encounters:  10/24/23 224 lb (101.6 kg)  08/28/23 218 lb (98.9 kg)  08/27/23 220 lb (99.8 kg)    Her blood pressure has been controlled at home, today their BP is    She is currently on Lisinopril. She does not workout. She denies chest pain, shortness of breath, dizziness.   She is on cholesterol medication Rosuvastatin and denies myalgias. Her cholesterol is at goal. The cholesterol last visit was:   Lab Results  Component Value Date   CHOL 172 01/22/2023   HDL 88 01/22/2023   LDLCALC 65 01/22/2023   TRIG 108 01/22/2023   CHOLHDL 2.0 01/22/2023   She has not been working on diet and exercise for any prediabetes, and denies polydipsia and polyuria. Last A1C in the office was:  Lab Results  Component Value Date   HGBA1C 5.8 (H) 04/17/2023   Last GFR: Lab Results  Component Value Date   EGFR  76 04/17/2023   Patient is on Vitamin D supplement.   Lab Results  Component Value Date   VD25OH 80.96 06/02/2023      Medication Review: Current Outpatient  Medications on File Prior to Visit  Medication Sig Dispense Refill   albuterol (VENTOLIN HFA) 108 (90 Base) MCG/ACT inhaler Inhale 2 puffs into the lungs every 6 (six) hours as needed for wheezing or shortness of breath.     benzonatate (TESSALON) 100 MG capsule Take 1 capsule (100 mg total) by mouth every 8 (eight) hours. 21 capsule 0   budesonide (PULMICORT) 0.5 MG/2ML nebulizer solution Take 2 mLs (0.5 mg total) by nebulization daily as needed. 2 mL 2   buPROPion (WELLBUTRIN XL) 300 MG 24 hr tablet Take 300 mg by mouth daily.     dexamethasone (DECADRON) 4 MG tablet Take 1 tab 3 x /day for 2 days, then 2 x /day for 2  Days,  then 1 tab daily 13 tablet 0   EPINEPHrine 0.3 mg/0.3 mL IJ SOAJ injection See admin instructions.     fexofenadine (ALLEGRA) 180 MG tablet Take 180 mg by mouth daily as needed for allergies.     FLUoxetine (PROZAC) 20 MG capsule Take 60 mg by mouth daily.     fluticasone (FLONASE) 50 MCG/ACT nasal spray Place 1 spray into both nostrils daily as needed for allergies or rhinitis.     levocetirizine (XYZAL) 5 MG tablet Take 5 mg by mouth every evening.  3   lisinopril (ZESTRIL) 20 MG tablet Take 20 mg by mouth daily.     methylphenidate (RITALIN) 10 MG tablet Take 10 mg by mouth 2 (two) times daily.  0   montelukast (SINGULAIR) 10 MG tablet Take 10 mg by mouth at bedtime.     neomycin-polymyxin b-dexamethasone (MAXITROL) 3.5-10000-0.1 OINT Apply a small amount into the lower eyelid 3 to 4 x / day 3.5 g 2   NON FORMULARY Inject 1 Dose into the skin once a week. Allergy shots from [facility] at [place]. Patient not sure about the name of medicine     pantoprazole (PROTONIX) 40 MG tablet TAKE 1 TABLET BY MOUTH DAILY TO PREVENT INDIGESTION AND HEARTBURN 90 tablet 3   promethazine-dextromethorphan (PROMETHAZINE-DM) 6.25-15 MG/5ML syrup Take 2.5 mLs by mouth at bedtime as needed for cough. 118 mL 0   rosuvastatin (CRESTOR) 40 MG tablet Take 40 mg by mouth daily.      SYMBICORT 160-4.5 MCG/ACT inhaler Inhale 2 puffs into the lungs 2 (two) times daily.  6   traMADol (ULTRAM) 50 MG tablet Take 1 tablet (50 mg total) by mouth every 8 (eight) hours as needed for moderate pain. 30 tablet 0   traZODone (DESYREL) 50 MG tablet Take 1 tablet (50 mg total) by mouth at bedtime. 60 tablet 5   VITAMIN D PO Take 10,000 Units by mouth daily.     Current Facility-Administered Medications on File Prior to Visit  Medication Dose Route Frequency Provider Last Rate Last Admin   ipratropium-albuterol (DUONEB) 0.5-2.5 (3) MG/3ML nebulizer solution 3 mL  3 mL Nebulization Once         Allergies  Allergen Reactions   Molds & Smuts Anaphylaxis   Biaxin [Clarithromycin]     GI upset   Biaxin [Clarithromycin] Other (See Comments)    GI Upset   Prednisone     Was admitted for self harm when on prednisone before   Prednisone Other (See Comments)    Suicidal   Serevent [  Salmeterol]     Palpitations   Tequin [Gatifloxacin]     GI upset. thrush   Tequin [Gatifloxacin] Other (See Comments)    GI upset, Thrush   Serevent [Salmeterol] Palpitations    Current Problems (verified) Patient Active Problem List   Diagnosis Date Noted   OSA on CPAP 08/11/2023   Ankle fracture 06/03/2023   Trimalleolar fracture of left ankle 06/02/2023   Acute left ankle pain 06/02/2023   Sleep-related hypoxia 03/24/2023   At risk for obstructive sleep apnea 03/24/2023   Acute pulmonary embolism (HCC) 05/10/2022   Allergic rhinitis 03/15/2021   Gastro-esophageal reflux disease without esophagitis 03/15/2021   Mild persistent asthma, uncomplicated 03/15/2021   Aortic atherosclerosis (HCC) by Chest CT scan on 02/02/2018  01/10/2021   Asthma    Low back pain 03/17/2019   Lumbosacral spondylosis without myelopathy 03/17/2019   Pseudogout 12/09/2017   Major depressive disorder, recurrent, severe without psychotic features (HCC)    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   Suicide attempt by drug  ingestion (HCC)    Vitamin D deficiency 08/22/2014   Hyperlipidemia, mixed 08/22/2014   Hypertension    Anxiety    Abnormal glucose    Class 2 severe obesity due to excess calories with serious comorbidity and body mass index (BMI) of 37.0 to 37.9 in adult [facility])     Screening Tests Immunization History  Administered Date(s) Administered   Influenza, High Dose Seasonal PF 10/17/2017   Influenza, Quadrivalent, Recombinant, Inj, Pf 08/20/2018   Influenza,inj,Quad PF,6+ Mos 09/17/2017   Influenza-Unspecified 08/20/2018   PFIZER(Purple Top)SARS-COV-2 Vaccination 01/20/2020, 02/10/2020, 03/27/2020   PPD Test 10/08/2017, 11/29/2019, 01/10/2021   Pneumococcal Conjugate-13 11/05/1995   Pneumococcal Polysaccharide-23 10/08/2017, 06/15/2021   Td 11/29/2019   Tdap 11/04/2008, 03/28/2020   Health Maintenance  Topic Date Due   Hepatitis C Screening  Never done   Zoster Vaccines- Shingrix (1 of 2) Never done   MAMMOGRAM  01/13/2022   Colonoscopy  12/26/2022   INFLUENZA VACCINE  06/05/2023   COVID-19 Vaccine (4 - 2024-25 season) 07/06/2023   Medicare Annual Wellness (AWV)  07/23/2024   Pneumonia Vaccine 52+ Years old (3 of 3 - PPSV23 or PCV20) 06/15/2026   DTaP/Tdap/Td (4 - Td or Tdap) 03/28/2030   DEXA SCAN  Completed   HPV VACCINES  Aged Out   Patient Care Team: Lucky Cowboy, MD as PCP - General (Internal Medicine) Hart Carwin, MD (Inactive) as Consulting Physician (Gastroenterology) Milagros Evener, MD as Consulting Physician (Psychiatry) Lucky Cowboy, MD (Internal Medicine)  SURGICAL HISTORY She  has a past surgical history that includes Knee surgery (Left, 1998); Foot surgery (Left, 2002); Appendectomy (1984); Cataract extraction w/ intraocular lens  implant, bilateral (2014); Cesarean section (1984, 1987, 1994); Tubal ligation (1994); Rotator cuff repair (Right, 2007); Colonoscopy (11/16/2013); Tubal ligation; Cesarean section; Ankle surgery (Left); Eye surgery  (Bilateral); Fracture surgery (Left); shoulder; Appendectomy; Foot fracture surgery (Left); ORIF ankle fracture (Left, 06/04/2023); and External fixation leg (Left, 06/04/2023). FAMILY HISTORY Her family history includes Anxiety disorder in her sister; Dementia in her father. SOCIAL HISTORY She  reports that she has never smoked. She has never used smokeless tobacco. She reports current alcohol use. She reports that she does not use drugs. Review of Systems  Constitutional: Negative.   HENT: Negative.    Eyes:  Positive for pain, discharge and redness.  Respiratory: Negative.    Cardiovascular: Negative.   Gastrointestinal:  Positive for heartburn.  Genitourinary: Negative.   Musculoskeletal:  Positive for back pain, falls  and joint pain.       Left foot pain  Skin:  Positive for rash.  Endo/Heme/Allergies: Negative.   Psychiatric/Behavioral:  Positive for depression. The patient is nervous/anxious.      Objective:     Today's Vitals   10/24/23 1038  Weight: 224 lb (101.6 kg)  Height: 5\' 2"  (1.575 m)   Body mass index is 40.97 kg/m.  General appearance: Knee scooter, alert, no distress, WD/WN, female HEENT: normocephalic, sclerae anicteric, TMs pearly, nares patent, no discharge or erythema, pharynx normal Oral cavity: MMM, no lesions Neck: supple, no lymphadenopathy, no thyromegaly, no masses Heart: RRR, normal S1, S2, no murmurs Lungs: CTA bilaterally, no wheezes, rhonchi, or rales Abdomen: +bs, soft, non tender, non distended, no masses, no hepatomegaly, no splenomegaly Musculoskeletal: Left orthopdic boot; RLE nontender, no swelling, no obvious deformity Extremities: no edema, no cyanosis, no clubbing Pulses: 2+ symmetric, upper and lower extremities, normal cap refill Neurological: alert, oriented x 3, CN2-12 intact, strength normal upper extremities and lower extremities, sensation normal throughout, DTRs 2+ throughout, no cerebellar signs, gait normal Psychiatric:  normal affect, behavior normal, pleasant   Haley Lebron, NP   10/24/2023

## 2023-10-24 NOTE — Patient Instructions (Signed)

## 2023-10-25 LAB — CBC WITH DIFFERENTIAL/PLATELET
Absolute Lymphocytes: 1979 {cells}/uL (ref 850–3900)
Absolute Monocytes: 601 {cells}/uL (ref 200–950)
Basophils Absolute: 62 {cells}/uL (ref 0–200)
Basophils Relative: 0.8 %
Eosinophils Absolute: 470 {cells}/uL (ref 15–500)
Eosinophils Relative: 6.1 %
HCT: 39.9 % (ref 35.0–45.0)
Hemoglobin: 13 g/dL (ref 11.7–15.5)
MCH: 30.7 pg (ref 27.0–33.0)
MCHC: 32.6 g/dL (ref 32.0–36.0)
MCV: 94.1 fL (ref 80.0–100.0)
MPV: 10.3 fL (ref 7.5–12.5)
Monocytes Relative: 7.8 %
Neutro Abs: 4589 {cells}/uL (ref 1500–7800)
Neutrophils Relative %: 59.6 %
Platelets: 297 10*3/uL (ref 140–400)
RBC: 4.24 10*6/uL (ref 3.80–5.10)
RDW: 12.9 % (ref 11.0–15.0)
Total Lymphocyte: 25.7 %
WBC: 7.7 10*3/uL (ref 3.8–10.8)

## 2023-10-25 LAB — HEMOGLOBIN A1C
Hgb A1c MFr Bld: 6.2 %{Hb} — ABNORMAL HIGH (ref <5.7)
Mean Plasma Glucose: 131 mg/dL
eAG (mmol/L): 7.3 mmol/L

## 2023-10-25 LAB — COMPLETE METABOLIC PANEL WITH GFR
AG Ratio: 2.4 (calc) (ref 1.0–2.5)
ALT: 30 U/L — ABNORMAL HIGH (ref 6–29)
AST: 22 U/L (ref 10–35)
Albumin: 4.6 g/dL (ref 3.6–5.1)
Alkaline phosphatase (APISO): 89 U/L (ref 37–153)
BUN: 16 mg/dL (ref 7–25)
CO2: 27 mmol/L (ref 20–32)
Calcium: 9.1 mg/dL (ref 8.6–10.4)
Chloride: 106 mmol/L (ref 98–110)
Creat: 0.82 mg/dL (ref 0.50–1.05)
Globulin: 1.9 g/dL (ref 1.9–3.7)
Glucose, Bld: 83 mg/dL (ref 65–99)
Potassium: 4.3 mmol/L (ref 3.5–5.3)
Sodium: 143 mmol/L (ref 135–146)
Total Bilirubin: 0.8 mg/dL (ref 0.2–1.2)
Total Protein: 6.5 g/dL (ref 6.1–8.1)
eGFR: 79 mL/min/{1.73_m2} (ref >=60)

## 2023-10-25 LAB — LIPID PANEL
Cholesterol: 215 mg/dL — ABNORMAL HIGH (ref <200)
HDL: 97 mg/dL (ref >=50)
LDL Cholesterol (Calc): 92 mg/dL
Non-HDL Cholesterol (Calc): 118 mg/dL (ref <130)
Total CHOL/HDL Ratio: 2.2 (calc) (ref <5.0)
Triglycerides: 163 mg/dL — ABNORMAL HIGH (ref <150)

## 2023-10-27 NOTE — Therapy (Signed)
OUTPATIENT PHYSICAL THERAPY LOWER EXTREMITY EVALUATION   Patient Name: Haley White MRN: [id number] DOB:[birth year], 67 y.o., female Today's Date: 10/28/2023  END OF SESSION:  PT End of Session - 10/28/23 1330     Visit Number 1    Number of Visits 16    Date for PT Re-Evaluation 12/26/23    PT Start Time 1117    PT Stop Time 1200    PT Time Calculation (min) 43 min    Activity Tolerance Patient tolerated treatment well    Behavior During Therapy WFL for tasks assessed/performed             Past Medical History:  Diagnosis Date   Anxiety    Arthritis    Asthma    Cataract    Depression    Gout    Hyperlipidemia    Hypertension    Obese    Past Surgical History:  Procedure Laterality Date   ANKLE SURGERY Left    APPENDECTOMY  1984   APPENDECTOMY     CATARACT EXTRACTION W/ INTRAOCULAR LENS  IMPLANT, BILATERAL  2014   CESAREAN SECTION  1984, 1987, 1994   X3   CESAREAN SECTION     x3   COLONOSCOPY  11/16/2013   w/Brodie    EXTERNAL FIXATION LEG Left 06/04/2023   Procedure: EXTERNAL FIXATION LEG;  Surgeon: Netta Cedars, MD;  Location: [facility];  Service: Orthopedics;  Laterality: Left;   EYE SURGERY Bilateral    lasix   FOOT FRACTURE SURGERY Left    FOOT SURGERY Left 2002   FRACTURE SURGERY Left    left ankle   KNEE SURGERY Left 1998   ORIF ANKLE FRACTURE Left 06/04/2023   Procedure: OPEN REDUCTION INTERNAL FIXATION (ORIF) ANKLE FRACTURE vs EX FIX;  Surgeon: Netta Cedars, MD;  Location: [facility];  Service: Orthopedics;  Laterality: Left;   ROTATOR CUFF REPAIR Right 2007   shoulder     torn rotator cuff   TUBAL LIGATION  1994   TUBAL LIGATION     Patient Active Problem List   Diagnosis Date Noted   OSA on CPAP 08/11/2023   Ankle fracture 06/03/2023   Trimalleolar fracture of left ankle 06/02/2023   Acute left ankle pain 06/02/2023   Sleep-related hypoxia 03/24/2023   At risk for obstructive sleep apnea 03/24/2023   Acute pulmonary embolism  (HCC) 05/10/2022   Allergic rhinitis 03/15/2021   Gastro-esophageal reflux disease without esophagitis 03/15/2021   Mild persistent asthma, uncomplicated 03/15/2021   Aortic atherosclerosis (HCC) by Chest CT scan on 02/02/2018  01/10/2021   Asthma    Low back pain 03/17/2019   Lumbosacral spondylosis without myelopathy 03/17/2019   Pseudogout 12/09/2017   Major depressive disorder, recurrent, severe without psychotic features (HCC)    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   Suicide attempt by drug ingestion (HCC)    Vitamin D deficiency 08/22/2014   Hyperlipidemia, mixed 08/22/2014   Hypertension    Anxiety    Abnormal glucose    Class 2 severe obesity due to excess calories with serious comorbidity and body mass index (BMI) of 37.0 to 37.9 in adult [facility])     PCP: Lucky Cowboy MD  REFERRING PROVIDER: Netta Cedars, MD   REFERRING DIAG: S82.852D (ICD-10-CM) - Displaced trimalleolar fracture of left lower leg, subsequent encounter for closed fracture with routine healing   THERAPY DIAG:  Closed displaced trimalleolar fracture of left ankle with routine healing, subsequent encounter  Pain in left ankle and joints of left foot  Stiffness of left ankle, not elsewhere classified  Difficulty in walking, not elsewhere classified  Rationale for Evaluation and Treatment: Rehabilitation  ONSET DATE: July 2024  SUBJECTIVE:   SUBJECTIVE STATEMENT: I fell end and broke left ankle walking dog.  Surgically repaired can put all weight on it.  Sometimes I use walker sometimes cane. Will have hardware remove sometime in the next 4-6 weeks.   PERTINENT HISTORY: The patient is s/p left ankle trimalleolar & syndesmosis ORIF, DOS: 06/05/2023  PAIN:  Are you having pain? Yes: NPRS scale: current 6/10; worst 8/10; least 1-2/10 Pain location: post lat ankle peroneal area Pain description: sharpe ache Aggravating factors: being up on it; standing/walk 45 mins. Relieving factors:  resting elevating, icing  PRECAUTIONS: None wearing ankle brace from [facility] during day.  RED FLAGS: None   WEIGHT BEARING RESTRICTIONS: No  FALLS:  Has patient fallen in last 6 months? Yes. Number of falls 1 fell down wet hill walking dog  LIVING ENVIRONMENT: Lives with: lives alone Lives in: House/apartment Stairs: Yes: External: 3 steps; none Has following equipment at home: Single point cane   OCCUPATION: retired   PLOF: Independent  PATIENT GOALS: walk normally and as pain free as able  NEXT MD VISIT: Nov 12, 2022  OBJECTIVE:  Note: Objective measures were completed at Evaluation unless otherwise noted.  DIAGNOSTIC FINDINGS: IMPRESSION: 1. Acute trimalleolar fracture-subluxation as described above. 2. Additional small acute avulsion fracture of the anterolateral tibial plafond.   MRI cervical spine (05/28/2023): Severe left C5-6 neuroforaminal narrowing impinging on exiting left C6 nerve roots. Mild to moderate right and mild left C6-7 neuroforaminal narrowing. C7-T1 with moderate right and mild left facet arthrosis without narrowing.    MRI lumbar spine (11/01/2022): Mild levoconvex scoliosis. Chronic mild anterior wedge compression fracture of T12, 22% anterior body height loss and chronic anterior wedge compression deformity of L1 with 36% anterior body height loss. Moderate left L4-5 neuroforaminal narrowing with contact and mild dorsal lateral displacement of exiting left L4 nerve roots. Mild right L3-4 neuroforaminal narrowing. Mild multilevel retrolisthesis.     Assessment/Plan  Patient returns for follow-up and review of EMG/nerve conduction study. It demonstrates mild carpal tunnel syndrome but no evidence of cervical radiculopathy. Given EMG findings and no improvement of her symptoms with cervical epidural steroid injection, I suspect that her pain does not originate from her cervical spine. Physical exam is notable for marked tenderness over the lateral  epicondyle and pain with resisted wrist extension; I suspect that she may have a degree of lateral epicondylitis. Would recommend return to Dr. Melvyn Novas for evaluation of this pain as well as mild carpal tunnel syndrome. She does have a history of fibromyalgia which I believe complicates her pain presentation. Will also start her on Lyrica 25 mg twice daily. May uptitrate to 50 mg twice daily if no improvement. Follow-up for medication titration in 6 to 8 weeks.    PATIENT SURVEYS:  FOTO Primary score 40%; risk adjusted 46% with goal of 59%  COGNITION: Overall cognitive status: Within functional limits for tasks assessed     SENSATION: WFL  EDEMA:  Mod to minimal post left lat malleolus     PALPATION: Moderate TTP throughout left lat malleolus, peroneals and distal acchilles Minimal TTP medial malleolus at point of hardware  LOWER EXTREMITY ROM:  Active ROM Right eval Left eval  Hip flexion    Hip extension    Hip abduction    Hip adduction    Hip internal rotation  Hip external rotation    Knee flexion    Knee extension    Ankle dorsiflexion  -8  Ankle plantarflexion  30d  Ankle inversion  Limited by 50%  Ankle eversion  Limited by 50%   (Blank rows = not tested)  LOWER EXTREMITY MMT:  MMT Right eval Left eval  Hip flexion    Hip extension    Hip abduction    Hip adduction    Hip internal rotation    Hip external rotation    Knee flexion    Knee extension    Ankle dorsiflexion 5 2+  Ankle plantarflexion 5 3-  Ankle inversion 5 3-  Ankle eversion 5 3-   (Blank rows = not tested)  GAIT: Distance walked: 500 ft Assistive device utilized: Single point cane Level of assistance: Complete Independence Comments: antalgic limp offset right                                                                                                                                TREATMENT DATE:  Eval See HEP   PATIENT EDUCATION:  Education details: Discussed eval  findings, rehab rationale, aquatic program progression/POC and pools in area. Patient is in agreement  Person educated: Patient Education method: Explanation Education comprehension: verbalized understanding  HOME EXERCISE PROGRAM: Access Code: [email or web address] URL: [email or web address] Date: 10/28/2023 Prepared by: Geni Bers  Exercises - Seated Ankle Alphabet  - 3 x daily - 7 x weekly - 1 sets - 10 reps - Seated Heel Raise  - 2-3 x daily - 7 x weekly - 2 sets - 10 reps - Seated Toe Raise  - 2-3 x daily - 7 x weekly - 2 sets - 10 reps  ASSESSMENT:  CLINICAL IMPRESSION: Patient is a 67 y.o. f who was seen today for physical therapy evaluation and treatment for s/p left trimalleolar fx with repair. She is WBAT using a cane, sometimes a walker (if increase pain) and wearing and ankle brace when up during day.  She has moderate pain sensitivity with palpation throughout L lat malleolar area with particular discomfort about peroneal tendons.  She is limited in ankle ROM all planes limited > by pain than edema.  Edema minimal.  She will benefit from skilled PT to improve strength and ROM of left ankle improving gait , decreasing pain and returning pt to PLOF.  OBJECTIVE IMPAIRMENTS: Abnormal gait, decreased activity tolerance, decreased balance, difficulty walking, decreased ROM, decreased strength, obesity, and pain.   ACTIVITY LIMITATIONS: squatting, stairs, and locomotion level  PARTICIPATION LIMITATIONS: shopping and community activity  PERSONAL FACTORS: Past/current experiences and 1 comorbidity: back dysfunction  are also affecting patient's functional outcome.   REHAB POTENTIAL: Good  CLINICAL DECISION MAKING: Evolving/moderate complexity  EVALUATION COMPLEXITY: Moderate   GOALS: Goals reviewed with patient? Yes  SHORT TERM GOALS: Target date: 11/24/23 Pt will tolerate full aquatic sessions consistently without increase in pain and with improving function to  demonstrate  good toleration and effectiveness of intervention.  Baseline: Goal status: INITIAL  2.  Pt will navigate stair into and out of pool 6 steps using step through pattern Baseline:  Goal status: INITIAL  3.  Pt will report reduction in pain by 3 NPRS post aquatic session which lasts for up to 24 hours. Baseline:  Goal status: INITIAL  4.  Pt will amb without antalgic gait pattern submerged in 3.6 ft Baseline:  Goal status: INITIAL   LONG TERM GOALS: Target date: 12/26/23  Pt to will improve foto score by at least 12 % to demonstrate improved perception of function. Baseline: 40% primary score Goal status: INITIAL  2.  Pt wiil navigate 1 flight of steps using step alternating pattern indep Baseline:  Goal status: INITIAL  3.  Pt will improve on left ankle ROM to 75% of normal Baseline: see chart Goal status: INITIAL  4.  Pt will improve on left ankle DF and PF strength by at least 1 grade to demonstrate improving functional ability Baseline: see chart Goal status: INITIAL  5.  Pt will amb without AD without limitation to pain Baseline:using cane or walker  Goal status: INITIAL    PLAN:  PT FREQUENCY: 1-2x/week  PT DURATION: 8 weeks  PLANNED INTERVENTIONS: 97164- PT Re-evaluation, 97110-Therapeutic exercises, 97530- Therapeutic activity, 97112- Neuromuscular re-education, 97535- Self Care, [id number]- Manual therapy, [phone]- Gait training, [phone]- Orthotic Fit/training, [phone]- Aquatic Therapy, Patient/Family education, Balance training, Stair training, Taping, Dry Needling, Joint mobilization, DME instructions, Cryotherapy, and Moist heat  PLAN FOR NEXT SESSION: ankle ROM, strengthening.  Gait retraining, balance retraining   Geni Bers, PT 10/28/2023, 1:38 PM

## 2023-10-28 ENCOUNTER — Encounter (HOSPITAL_BASED_OUTPATIENT_CLINIC_OR_DEPARTMENT_OTHER): Payer: Self-pay | Admitting: Physical Therapy

## 2023-10-28 ENCOUNTER — Ambulatory Visit (HOSPITAL_BASED_OUTPATIENT_CLINIC_OR_DEPARTMENT_OTHER): Payer: Medicare HMO | Attending: Orthopaedic Surgery | Admitting: Physical Therapy

## 2023-10-28 ENCOUNTER — Other Ambulatory Visit: Payer: Self-pay

## 2023-10-28 ENCOUNTER — Ambulatory Visit (HOSPITAL_BASED_OUTPATIENT_CLINIC_OR_DEPARTMENT_OTHER): Payer: Medicare HMO | Admitting: Physical Therapy

## 2023-10-28 ENCOUNTER — Telehealth: Payer: Self-pay

## 2023-10-28 DIAGNOSIS — X58XXXD Exposure to other specified factors, subsequent encounter: Secondary | ICD-10-CM | POA: Diagnosis not present

## 2023-10-28 DIAGNOSIS — S82852D Displaced trimalleolar fracture of left lower leg, subsequent encounter for closed fracture with routine healing: Secondary | ICD-10-CM | POA: Insufficient documentation

## 2023-10-28 DIAGNOSIS — M25672 Stiffness of left ankle, not elsewhere classified: Secondary | ICD-10-CM | POA: Insufficient documentation

## 2023-10-28 DIAGNOSIS — M25572 Pain in left ankle and joints of left foot: Secondary | ICD-10-CM | POA: Diagnosis not present

## 2023-10-28 DIAGNOSIS — R262 Difficulty in walking, not elsewhere classified: Secondary | ICD-10-CM | POA: Insufficient documentation

## 2023-10-28 NOTE — Telephone Encounter (Signed)
-----   Message from [place] Dohmeier sent at 10/24/2023 10:47 AM EST ----- ONO on CPAP :  By medicare ( CMS ) criteria , this patient does not qualify for nocturnal oxygen supplementation while using CPAP.  She did have a significantly low nadir and over 30 minutes total hypoxia time, but the individual events were not lasting long enough.   PS This test only tried to qualify for oxygen at night- if there is a chance of hypoxia in daytime, please refer to pulmonology for work up.   [employer] medicare

## 2023-10-28 NOTE — Telephone Encounter (Signed)
I spoke with the patient and provided the results of the ONO on CPAP. I informed her that she did not qualify for nocturnal oxygen supplementation at this time. I advised that the message was sent to her PCP Dr. Oneta Rack who may refer her if there is a chance of hypoxia in daytime. She verbalized understanding of the results and expressed appreciation for the call.

## 2023-10-31 ENCOUNTER — Ambulatory Visit (HOSPITAL_BASED_OUTPATIENT_CLINIC_OR_DEPARTMENT_OTHER): Payer: Medicare HMO | Admitting: Physical Therapy

## 2023-10-31 ENCOUNTER — Encounter (HOSPITAL_BASED_OUTPATIENT_CLINIC_OR_DEPARTMENT_OTHER): Payer: Self-pay | Admitting: Physical Therapy

## 2023-10-31 DIAGNOSIS — R262 Difficulty in walking, not elsewhere classified: Secondary | ICD-10-CM

## 2023-10-31 DIAGNOSIS — X58XXXD Exposure to other specified factors, subsequent encounter: Secondary | ICD-10-CM | POA: Diagnosis not present

## 2023-10-31 DIAGNOSIS — M25572 Pain in left ankle and joints of left foot: Secondary | ICD-10-CM | POA: Diagnosis not present

## 2023-10-31 DIAGNOSIS — M25672 Stiffness of left ankle, not elsewhere classified: Secondary | ICD-10-CM | POA: Diagnosis not present

## 2023-10-31 DIAGNOSIS — S82852D Displaced trimalleolar fracture of left lower leg, subsequent encounter for closed fracture with routine healing: Secondary | ICD-10-CM | POA: Diagnosis not present

## 2023-10-31 NOTE — Therapy (Signed)
OUTPATIENT PHYSICAL THERAPY LOWER EXTREMITY TREATMENT   Patient Name: Haley White MRN: [id number] DOB:Mar 21, 1956, 67 y.o., female Today's Date: 10/31/2023  END OF SESSION:  PT End of Session - 10/31/23 0838     Visit Number 2    Number of Visits 16    Date for PT Re-Evaluation 12/26/23    PT Start Time 0817    PT Stop Time 0855    PT Time Calculation (min) 38 min    Behavior During Therapy [facility] for tasks assessed/performed             Past Medical History:  Diagnosis Date   Anxiety    Arthritis    Asthma    Cataract    Depression    Gout    Hyperlipidemia    Hypertension    Obese    Past Surgical History:  Procedure Laterality Date   ANKLE SURGERY Left    APPENDECTOMY  1984   APPENDECTOMY     CATARACT EXTRACTION W/ INTRAOCULAR LENS  IMPLANT, BILATERAL  2014   CESAREAN SECTION  1984, 1987, 1994   X3   CESAREAN SECTION     x3   COLONOSCOPY  11/16/2013   w/Brodie    EXTERNAL FIXATION LEG Left 06/04/2023   Procedure: EXTERNAL FIXATION LEG;  Surgeon: Netta Cedars, MD;  Location: [facility];  Service: Orthopedics;  Laterality: Left;   EYE SURGERY Bilateral    lasix   FOOT FRACTURE SURGERY Left    FOOT SURGERY Left 2002   FRACTURE SURGERY Left    left ankle   KNEE SURGERY Left 1998   ORIF ANKLE FRACTURE Left 06/04/2023   Procedure: OPEN REDUCTION INTERNAL FIXATION (ORIF) ANKLE FRACTURE vs EX FIX;  Surgeon: Netta Cedars, MD;  Location: [facility];  Service: Orthopedics;  Laterality: Left;   ROTATOR CUFF REPAIR Right 2007   shoulder     torn rotator cuff   TUBAL LIGATION  1994   TUBAL LIGATION     Patient Active Problem List   Diagnosis Date Noted   OSA on CPAP 08/11/2023   Ankle fracture 06/03/2023   Trimalleolar fracture of left ankle 06/02/2023   Acute left ankle pain 06/02/2023   Sleep-related hypoxia 03/24/2023   At risk for obstructive sleep apnea 03/24/2023   Acute pulmonary embolism (HCC) 05/10/2022   Allergic rhinitis 03/15/2021    Gastro-esophageal reflux disease without esophagitis 03/15/2021   Mild persistent asthma, uncomplicated 03/15/2021   Aortic atherosclerosis (HCC) by Chest CT scan on 02/02/2018  01/10/2021   Asthma    Low back pain 03/17/2019   Lumbosacral spondylosis without myelopathy 03/17/2019   Pseudogout 12/09/2017   Major depressive disorder, recurrent, severe without psychotic features (HCC)    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   Suicide attempt by drug ingestion (HCC)    Vitamin D deficiency 08/22/2014   Hyperlipidemia, mixed 08/22/2014   Hypertension    Anxiety    Abnormal glucose    Class 2 severe obesity due to excess calories with serious comorbidity and body mass index (BMI) of 37.0 to 37.9 in adult [facility])     PCP: Lucky Cowboy MD  REFERRING PROVIDER: Netta Cedars, MD   REFERRING DIAG: S82.852D (ICD-10-CM) - Displaced trimalleolar fracture of left lower leg, subsequent encounter for closed fracture with routine healing   THERAPY DIAG:  Closed displaced trimalleolar fracture of left ankle with routine healing, subsequent encounter  Pain in left ankle and joints of left foot  Stiffness of left ankle, not elsewhere classified  Difficulty  in walking, not elsewhere classified  Rationale for Evaluation and Treatment: Rehabilitation  ONSET DATE: July 2024  SUBJECTIVE:   SUBJECTIVE STATEMENT: Pt reports no new changes since evaluation.  She will have follow up in Jan to find out when screws will be removed.   PERTINENT HISTORY: The patient is s/p left ankle trimalleolar & syndesmosis ORIF, DOS: 06/05/2023  PAIN:  Are you having pain? Yes: NPRS scale: current 4-5/10;  Pain location: L post lat ankle peroneal area Pain description: sharpe ache Aggravating factors: being up on it; standing/walk 45 mins. Relieving factors: resting elevating, icing  PRECAUTIONS: None wearing ankle brace from [facility] during day.  RED FLAGS: None   WEIGHT BEARING RESTRICTIONS:  No  FALLS:  Has patient fallen in last 6 months? Yes. Number of falls 1 fell down wet hill walking dog  LIVING ENVIRONMENT: Lives with: lives alone Lives in: House/apartment Stairs: Yes: External: 3 steps; none Has following equipment at home: Single point cane   OCCUPATION: retired   PLOF: Independent  PATIENT GOALS: walk normally and as pain free as able  NEXT MD VISIT: Nov 12, 2022  OBJECTIVE:  Note: Objective measures were completed at Evaluation unless otherwise noted.  DIAGNOSTIC FINDINGS: IMPRESSION: 1. Acute trimalleolar fracture-subluxation as described above. 2. Additional small acute avulsion fracture of the anterolateral tibial plafond.   MRI cervical spine (05/28/2023): Severe left C5-6 neuroforaminal narrowing impinging on exiting left C6 nerve roots. Mild to moderate right and mild left C6-7 neuroforaminal narrowing. C7-T1 with moderate right and mild left facet arthrosis without narrowing.    MRI lumbar spine (11/01/2022): Mild levoconvex scoliosis. Chronic mild anterior wedge compression fracture of T12, 22% anterior body height loss and chronic anterior wedge compression deformity of L1 with 36% anterior body height loss. Moderate left L4-5 neuroforaminal narrowing with contact and mild dorsal lateral displacement of exiting left L4 nerve roots. Mild right L3-4 neuroforaminal narrowing. Mild multilevel retrolisthesis.     Assessment/Plan  Patient returns for follow-up and review of EMG/nerve conduction study. It demonstrates mild carpal tunnel syndrome but no evidence of cervical radiculopathy. Given EMG findings and no improvement of her symptoms with cervical epidural steroid injection, I suspect that her pain does not originate from her cervical spine. Physical exam is notable for marked tenderness over the lateral epicondyle and pain with resisted wrist extension; I suspect that she may have a degree of lateral epicondylitis. Would recommend return to Dr.  Melvyn Novas for evaluation of this pain as well as mild carpal tunnel syndrome. She does have a history of fibromyalgia which I believe complicates her pain presentation. Will also start her on Lyrica 25 mg twice daily. May uptitrate to 50 mg twice daily if no improvement. Follow-up for medication titration in 6 to 8 weeks.    PATIENT SURVEYS:  FOTO Primary score 40%; risk adjusted 46% with goal of 59%  COGNITION: Overall cognitive status: Within functional limits for tasks assessed     SENSATION: WFL  EDEMA:  Mod to minimal post left lat malleolus     PALPATION: Moderate TTP throughout left lat malleolus, peroneals and distal acchilles Minimal TTP medial malleolus at point of hardware  LOWER EXTREMITY ROM:  Active ROM Right eval Left eval  Hip flexion    Hip extension    Hip abduction    Hip adduction    Hip internal rotation    Hip external rotation    Knee flexion    Knee extension    Ankle dorsiflexion  -8  Ankle plantarflexion  30d  Ankle inversion  Limited by 50%  Ankle eversion  Limited by 50%   (Blank rows = not tested)  LOWER EXTREMITY MMT:  MMT Right eval Left eval  Hip flexion    Hip extension    Hip abduction    Hip adduction    Hip internal rotation    Hip external rotation    Knee flexion    Knee extension    Ankle dorsiflexion 5 2+  Ankle plantarflexion 5 3-  Ankle inversion 5 3-  Ankle eversion 5 3-   (Blank rows = not tested)  GAIT: Distance walked: 500 ft Assistive device utilized: Single point cane Level of assistance: Complete Independence Comments: antalgic limp offset right                                                                                                                                TREATMENT DATE:  Pt seen for aquatic therapy today.  Treatment took place in water 3.5-4.75 ft in depth at the [facility] pool. Temp of water was 91.  Pt entered/exited the pool via stairs independently in step-to pattern  with bilat rail. * unsupported -> UE support on yellow hand floats- multiple laps forward/ backward with cues for heel/toe and toe/ heel * side stepping R/L with yellow hand floats (too much resistance)-> no UE resistance * straddling yellow noodle:  3 rounds of cycling, hip abdct/ addct, and cross country ski * holding wall with UE: gentle, partial heel/toe raises;  in staggered stance front to back weight shifts (allowing toe extension with knee flexion when leg in back) * hamstring stretch at stairs -15s x 2 reps each leg  * once dried off:  applied sensitive skin rock tape in stirrup pattern, covering both medial and lateral incisions (to desensitize) and crossing over talus anteriorly. Pt instructed in safe removal techniques; verbalized understanding  Pt requires the buoyancy and hydrostatic pressure of water for support, and to offload joints by unweighting joint load by at least 50 % in navel deep water and by at least 75-80% in chest to neck deep water.  Viscosity of the water is needed for resistance of strengthening. Water current perturbations provides challenge to standing balance requiring increased core activation.     PATIENT EDUCATION:  Education details: reacquainting to aquatic therapy; rock tape safe removal technique Person educated: Patient Education method: Explanation Education comprehension: verbalized understanding  HOME EXERCISE PROGRAM: Access Code: [email or web address] URL: [email or web address] Date: 10/28/2023 Prepared by: Geni Bers  Exercises - Seated Ankle Alphabet  - 3 x daily - 7 x weekly - 1 sets - 10 reps - Seated Heel Raise  - 2-3 x daily - 7 x weekly - 2 sets - 10 reps - Seated Toe Raise  - 2-3 x daily - 7 x weekly - 2 sets - 10 reps  ASSESSMENT:  CLINICAL IMPRESSION: Pt reported mild increase in tightness and pain with  movement of L ankle in water.  She has high sensitivity to touch and even remarked that her ankle felt sensitive to the  movement of water on her skin of ankle. Trial of rock tape applied to assist with desensitization.  Session spent with pt submerged 75-80%.  Goals are ongoing.   From initial impression:  Patient is a 67 y.o. f who was seen today for physical therapy evaluation and treatment for s/p left trimalleolar fx with repair. She is WBAT using a cane, sometimes a walker (if increase pain) and wearing and ankle brace when up during day.  She has moderate pain sensitivity with palpation throughout L lat malleolar area with particular discomfort about peroneal tendons.  She is limited in ankle ROM all planes limited > by pain than edema.  Edema minimal.  She will benefit from skilled PT to improve strength and ROM of left ankle improving gait , decreasing pain and returning pt to PLOF.  OBJECTIVE IMPAIRMENTS: Abnormal gait, decreased activity tolerance, decreased balance, difficulty walking, decreased ROM, decreased strength, obesity, and pain.   ACTIVITY LIMITATIONS: squatting, stairs, and locomotion level  PARTICIPATION LIMITATIONS: shopping and community activity  PERSONAL FACTORS: Past/current experiences and 1 comorbidity: back dysfunction  are also affecting patient's functional outcome.   REHAB POTENTIAL: Good  CLINICAL DECISION MAKING: Evolving/moderate complexity  EVALUATION COMPLEXITY: Moderate   GOALS: Goals reviewed with patient? Yes  SHORT TERM GOALS: Target date: 11/24/23 Pt will tolerate full aquatic sessions consistently without increase in pain and with improving function to demonstrate good toleration and effectiveness of intervention.  Baseline: Goal status: INITIAL  2.  Pt will navigate stair into and out of pool 6 steps using step through pattern Baseline:  Goal status: INITIAL  3.  Pt will report reduction in pain by 3 NPRS post aquatic session which lasts for up to 24 hours. Baseline:  Goal status: INITIAL  4.  Pt will amb without antalgic gait pattern submerged in 3.6  ft Baseline:  Goal status: INITIAL   LONG TERM GOALS: Target date: 12/26/23  Pt to will improve foto score by at least 12 % to demonstrate improved perception of function. Baseline: 40% primary score Goal status: INITIAL  2.  Pt wiil navigate 1 flight of steps using step alternating pattern indep Baseline:  Goal status: INITIAL  3.  Pt will improve on left ankle ROM to 75% of normal Baseline: see chart Goal status: INITIAL  4.  Pt will improve on left ankle DF and PF strength by at least 1 grade to demonstrate improving functional ability Baseline: see chart Goal status: INITIAL  5.  Pt will amb without AD without limitation to pain Baseline:using cane or walker  Goal status: INITIAL    PLAN:  PT FREQUENCY: 1-2x/week  PT DURATION: 8 weeks  PLANNED INTERVENTIONS: 97164- PT Re-evaluation, 97110-Therapeutic exercises, 97530- Therapeutic activity, 97112- Neuromuscular re-education, 97535- Self Care, 19147- Manual therapy, [phone]- Gait training, [phone]- Orthotic Fit/training, [phone]- Aquatic Therapy, Patient/Family education, Balance training, Stair training, Taping, Dry Needling, Joint mobilization, DME instructions, Cryotherapy, and Moist heat  PLAN FOR NEXT SESSION: ankle ROM, strengthening.  Gait retraining, balance retraining   Mayer Camel, PTA 10/31/23 4:40 PM [facility] [address] [place], [place], [address] Phone: [phone]   Fax:  [phone]

## 2023-11-01 ENCOUNTER — Encounter: Payer: Self-pay | Admitting: Nurse Practitioner

## 2023-11-01 ENCOUNTER — Other Ambulatory Visit: Payer: Self-pay | Admitting: Internal Medicine

## 2023-11-03 DIAGNOSIS — F331 Major depressive disorder, recurrent, moderate: Secondary | ICD-10-CM | POA: Diagnosis not present

## 2023-11-04 ENCOUNTER — Encounter (HOSPITAL_BASED_OUTPATIENT_CLINIC_OR_DEPARTMENT_OTHER): Payer: Self-pay | Admitting: Physical Therapy

## 2023-11-04 ENCOUNTER — Ambulatory Visit (HOSPITAL_BASED_OUTPATIENT_CLINIC_OR_DEPARTMENT_OTHER): Payer: Self-pay | Admitting: Physical Therapy

## 2023-11-04 DIAGNOSIS — S82852D Displaced trimalleolar fracture of left lower leg, subsequent encounter for closed fracture with routine healing: Secondary | ICD-10-CM | POA: Diagnosis not present

## 2023-11-04 DIAGNOSIS — M25572 Pain in left ankle and joints of left foot: Secondary | ICD-10-CM | POA: Diagnosis not present

## 2023-11-04 DIAGNOSIS — R262 Difficulty in walking, not elsewhere classified: Secondary | ICD-10-CM | POA: Diagnosis not present

## 2023-11-04 DIAGNOSIS — M25672 Stiffness of left ankle, not elsewhere classified: Secondary | ICD-10-CM | POA: Diagnosis not present

## 2023-11-04 DIAGNOSIS — X58XXXD Exposure to other specified factors, subsequent encounter: Secondary | ICD-10-CM | POA: Diagnosis not present

## 2023-11-04 NOTE — Therapy (Signed)
 OUTPATIENT PHYSICAL THERAPY LOWER EXTREMITY TREATMENT   Patient Name: Haley White MRN: [id number] DOB:06/19/1956, 66 y.o., female Today's Date: 11/04/2023  END OF SESSION:  PT End of Session - 11/04/23 0826     Visit Number 3    Number of Visits 16    Date for PT Re-Evaluation 12/26/23    PT Start Time 0817    PT Stop Time 0855    PT Time Calculation (min) 38 min    Behavior During Therapy [facility] for tasks assessed/performed             Past Medical History:  Diagnosis Date   Anxiety    Arthritis    Asthma    Cataract    Depression    Gout    Hyperlipidemia    Hypertension    Obese    Past Surgical History:  Procedure Laterality Date   ANKLE SURGERY Left    APPENDECTOMY  1984   APPENDECTOMY     CATARACT EXTRACTION W/ INTRAOCULAR LENS  IMPLANT, BILATERAL  2014   CESAREAN SECTION  1984, 1987, 1994   X3   CESAREAN SECTION     x3   COLONOSCOPY  11/16/2013   w/Brodie    EXTERNAL FIXATION LEG Left 06/04/2023   Procedure: EXTERNAL FIXATION LEG;  Surgeon: Barton Drape, MD;  Location: [facility];  Service: Orthopedics;  Laterality: Left;   EYE SURGERY Bilateral    lasix   FOOT FRACTURE SURGERY Left    FOOT SURGERY Left 2002   FRACTURE SURGERY Left    left ankle   KNEE SURGERY Left 1998   ORIF ANKLE FRACTURE Left 06/04/2023   Procedure: OPEN REDUCTION INTERNAL FIXATION (ORIF) ANKLE FRACTURE vs EX FIX;  Surgeon: Barton Drape, MD;  Location: [facility];  Service: Orthopedics;  Laterality: Left;   ROTATOR CUFF REPAIR Right 2007   shoulder     torn rotator cuff   TUBAL LIGATION  1994   TUBAL LIGATION     Patient Active Problem List   Diagnosis Date Noted   OSA on CPAP 08/11/2023   Ankle fracture 06/03/2023   Trimalleolar fracture of left ankle 06/02/2023   Acute left ankle pain 06/02/2023   Sleep-related hypoxia 03/24/2023   At risk for obstructive sleep apnea 03/24/2023   Acute pulmonary embolism (HCC) 05/10/2022   Allergic rhinitis 03/15/2021    Gastro-esophageal reflux disease without esophagitis 03/15/2021   Mild persistent asthma, uncomplicated 03/15/2021   Aortic atherosclerosis (HCC) by Chest CT scan on 02/02/2018  01/10/2021   Asthma    Low back pain 03/17/2019   Lumbosacral spondylosis without myelopathy 03/17/2019   Pseudogout 12/09/2017   Major depressive disorder, recurrent, severe without psychotic features (HCC)    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   Suicide attempt by drug ingestion (HCC)    Vitamin D  deficiency 08/22/2014   Hyperlipidemia, mixed 08/22/2014   Hypertension    Anxiety    Abnormal glucose    Class 2 severe obesity due to excess calories with serious comorbidity and body mass index (BMI) of 37.0 to 37.9 in adult [facility])     PCP: Elsie Richards MD  REFERRING PROVIDER: Barton Drape, MD   REFERRING DIAG: S82.852D (ICD-10-CM) - Displaced trimalleolar fracture of left lower leg, subsequent encounter for closed fracture with routine healing   THERAPY DIAG:  Closed displaced trimalleolar fracture of left ankle with routine healing, subsequent encounter  Pain in left ankle and joints of left foot  Stiffness of left ankle, not elsewhere classified  Difficulty  in walking, not elsewhere classified  Rationale for Evaluation and Treatment: Rehabilitation  ONSET DATE: July 2024  SUBJECTIVE:   SUBJECTIVE STATEMENT: Pt reports her back is bothering her today.  She reports she didn't notice much difference with the ktape.  Tape began to come off 24hrs later.    PERTINENT HISTORY: The patient is s/p left ankle trimalleolar & syndesmosis ORIF, DOS: 06/05/2023  PAIN:  Are you having pain? Yes: NPRS scale: current 3/10 L post lat ankle peroneal area; 7/10 lower back  Pain location: see above Pain description: sharpe ache Aggravating factors: being up on it; standing/walk 45 mins. Relieving factors: resting elevating, icing  PRECAUTIONS: None wearing ankle brace from [facility] during  day.  RED FLAGS: None   WEIGHT BEARING RESTRICTIONS: No  FALLS:  Has patient fallen in last 6 months? Yes. Number of falls 1 fell down wet hill walking dog  LIVING ENVIRONMENT: Lives with: lives alone Lives in: House/apartment Stairs: Yes: External: 3 steps; none Has following equipment at home: Single point cane   OCCUPATION: retired   PLOF: Independent  PATIENT GOALS: walk normally and as pain free as able  NEXT MD VISIT: Nov 12, 2022  OBJECTIVE:  Note: Objective measures were completed at Evaluation unless otherwise noted.  DIAGNOSTIC FINDINGS: IMPRESSION: 1. Acute trimalleolar fracture-subluxation as described above. 2. Additional small acute avulsion fracture of the anterolateral tibial plafond.   MRI cervical spine (05/28/2023): Severe left C5-6 neuroforaminal narrowing impinging on exiting left C6 nerve roots. Mild to moderate right and mild left C6-7 neuroforaminal narrowing. C7-T1 with moderate right and mild left facet arthrosis without narrowing.    MRI lumbar spine (11/01/2022): Mild levoconvex scoliosis. Chronic mild anterior wedge compression fracture of T12, 22% anterior body height loss and chronic anterior wedge compression deformity of L1 with 36% anterior body height loss. Moderate left L4-5 neuroforaminal narrowing with contact and mild dorsal lateral displacement of exiting left L4 nerve roots. Mild right L3-4 neuroforaminal narrowing. Mild multilevel retrolisthesis.     Assessment/Plan  Patient returns for follow-up and review of EMG/nerve conduction study. It demonstrates mild carpal tunnel syndrome but no evidence of cervical radiculopathy. Given EMG findings and no improvement of her symptoms with cervical epidural steroid injection, I suspect that her pain does not originate from her cervical spine. Physical exam is notable for marked tenderness over the lateral epicondyle and pain with resisted wrist extension; I suspect that she may have a degree of  lateral epicondylitis. Would recommend return to Dr. Shari for evaluation of this pain as well as mild carpal tunnel syndrome. She does have a history of fibromyalgia which I believe complicates her pain presentation. Will also start her on Lyrica 25 mg twice daily. May uptitrate to 50 mg twice daily if no improvement. Follow-up for medication titration in 6 to 8 weeks.    PATIENT SURVEYS:  FOTO Primary score 40%; risk adjusted 46% with goal of 59%  COGNITION: Overall cognitive status: Within functional limits for tasks assessed     SENSATION: WFL  EDEMA:  Mod to minimal post left lat malleolus     PALPATION: Moderate TTP throughout left lat malleolus, peroneals and distal acchilles Minimal TTP medial malleolus at point of hardware  LOWER EXTREMITY ROM:  Active ROM Right eval Left eval  Hip flexion    Hip extension    Hip abduction    Hip adduction    Hip internal rotation    Hip external rotation    Knee flexion  Knee extension    Ankle dorsiflexion  -8  Ankle plantarflexion  30d  Ankle inversion  Limited by 50%  Ankle eversion  Limited by 50%   (Blank rows = not tested)  LOWER EXTREMITY MMT:  MMT Right eval Left eval  Hip flexion    Hip extension    Hip abduction    Hip adduction    Hip internal rotation    Hip external rotation    Knee flexion    Knee extension    Ankle dorsiflexion 5 2+  Ankle plantarflexion 5 3-  Ankle inversion 5 3-  Ankle eversion 5 3-   (Blank rows = not tested)  GAIT: Distance walked: 500 ft Assistive device utilized: Single point cane Level of assistance: Complete Independence Comments: antalgic limp offset right                                                                                                                                TREATMENT DATE:  Pt seen for aquatic therapy today.  Treatment took place in water 3.5-4.75 ft in depth at the [facility] pool. Temp of water was 91.  Pt entered/exited the  pool via stairs independently in step-to pattern with bilat rail.  (Facing hot tub during session)  * straddling yellow noodle:  3 rounds of cycling, hip abdct/ addct, and cross country ski * unsupported  multiple laps forward/ backward with cues for heel/toe and toe/ heel * side stepping R/L with/without rainbow hand floats  * holding wall with UE: gentle, partial heel/toe raises; L stretch; single leg clams, hip crosses (knee/hip flexion crossing midline)  * hands on bench in water:  plank -> gentle hip ext, alternating LEs 2 x 5 * seated on bench:  cycling, ankle circles, ankle eversion/inversions * return to walking forward backward   * once dried off:  applied Regular rock tape in stirrup pattern, covering both medial and lateral incisions (to desensitize) and crossing over talus anteriorly.   Pt requires the buoyancy and hydrostatic pressure of water for support, and to offload joints by unweighting joint load by at least 50 % in navel deep water and by at least 75-80% in chest to neck deep water.  Viscosity of the water is needed for resistance of strengthening. Water current perturbations provides challenge to standing balance requiring increased core activation.     PATIENT EDUCATION:  Education details: reacquainting to aquatic therapy Person educated: Patient Education method: Explanation Education comprehension: verbalized understanding  HOME EXERCISE PROGRAM: Access Code: [email or web address] URL: [email or web address] Date: 10/28/2023 Prepared by: Matilda Kohut  Exercises - Seated Ankle Alphabet  - 3 x daily - 7 x weekly - 1 sets - 10 reps - Seated Heel Raise  - 2-3 x daily - 7 x weekly - 2 sets - 10 reps - Seated Toe Raise  - 2-3 x daily - 7 x weekly - 2 sets - 10 reps  ASSESSMENT:  CLINICAL IMPRESSION:  Pt reported no change in L ankle pain with movement of L ankle in water.    Back pain varied throughout session - cycling and plank with hip ext gave most relief  to back and did not irritate ankle. Pt observed moving in less guarded manner with less L ankle pain with gait.  Goals are ongoing.  Trial of Regular rock tape today (instead of sensitive skin); recommended pt utilize compression sock to combat increased swelling in foot with standing/ walking.    From initial impression:  Patient is a 67 y.o. f who was seen today for physical therapy evaluation and treatment for s/p left trimalleolar fx with repair. She is WBAT using a cane, sometimes a walker (if increase pain) and wearing and ankle brace when up during day.  She has moderate pain sensitivity with palpation throughout L lat malleolar area with particular discomfort about peroneal tendons.  She is limited in ankle ROM all planes limited > by pain than edema.  Edema minimal.  She will benefit from skilled PT to improve strength and ROM of left ankle improving gait , decreasing pain and returning pt to PLOF.  OBJECTIVE IMPAIRMENTS: Abnormal gait, decreased activity tolerance, decreased balance, difficulty walking, decreased ROM, decreased strength, obesity, and pain.   ACTIVITY LIMITATIONS: squatting, stairs, and locomotion level  PARTICIPATION LIMITATIONS: shopping and community activity  PERSONAL FACTORS: Past/current experiences and 1 comorbidity: back dysfunction  are also affecting patient's functional outcome.   REHAB POTENTIAL: Good  CLINICAL DECISION MAKING: Evolving/moderate complexity  EVALUATION COMPLEXITY: Moderate   GOALS: Goals reviewed with patient? Yes  SHORT TERM GOALS: Target date: 11/24/23 Pt will tolerate full aquatic sessions consistently without increase in pain and with improving function to demonstrate good toleration and effectiveness of intervention.  Baseline: Goal status: INITIAL  2.  Pt will navigate stair into and out of pool 6 steps using step through pattern Baseline:  Goal status: INITIAL  3.  Pt will report reduction in pain by 3 NPRS post aquatic  session which lasts for up to 24 hours. Baseline:  Goal status: INITIAL  4.  Pt will amb without antalgic gait pattern submerged in 3.6 ft Baseline:  Goal status: INITIAL   LONG TERM GOALS: Target date: 12/26/23  Pt to will improve foto score by at least 12 % to demonstrate improved perception of function. Baseline: 40% primary score Goal status: INITIAL  2.  Pt wiil navigate 1 flight of steps using step alternating pattern indep Baseline:  Goal status: INITIAL  3.  Pt will improve on left ankle ROM to 75% of normal Baseline: see chart Goal status: INITIAL  4.  Pt will improve on left ankle DF and PF strength by at least 1 grade to demonstrate improving functional ability Baseline: see chart Goal status: INITIAL  5.  Pt will amb without AD without limitation to pain Baseline:using cane or walker  Goal status: INITIAL    PLAN:  PT FREQUENCY: 1-2x/week  PT DURATION: 8 weeks  PLANNED INTERVENTIONS: 97164- PT Re-evaluation, 97110-Therapeutic exercises, 97530- Therapeutic activity, 97112- Neuromuscular re-education, 97535- Self Care, [id number]- Manual therapy, [phone]- Gait training, [phone]- Orthotic Fit/training, [phone]- Aquatic Therapy, Patient/Family education, Balance training, Stair training, Taping, Dry Needling, Joint mobilization, DME instructions, Cryotherapy, and Moist heat  PLAN FOR NEXT SESSION: ankle ROM, strengthening.  Gait retraining, balance retraining  Delon Aquas, PTA 11/04/23 1:16 PM [facility] [address] [place], [place], [address] Phone: [phone]   Fax:  [phone]

## 2023-11-10 ENCOUNTER — Encounter (HOSPITAL_BASED_OUTPATIENT_CLINIC_OR_DEPARTMENT_OTHER): Payer: Self-pay | Admitting: Physical Therapy

## 2023-11-10 ENCOUNTER — Ambulatory Visit (HOSPITAL_BASED_OUTPATIENT_CLINIC_OR_DEPARTMENT_OTHER): Payer: Medicare HMO | Attending: Orthopaedic Surgery | Admitting: Physical Therapy

## 2023-11-10 DIAGNOSIS — M25572 Pain in left ankle and joints of left foot: Secondary | ICD-10-CM | POA: Diagnosis not present

## 2023-11-10 DIAGNOSIS — S82852D Displaced trimalleolar fracture of left lower leg, subsequent encounter for closed fracture with routine healing: Secondary | ICD-10-CM | POA: Diagnosis not present

## 2023-11-10 DIAGNOSIS — R262 Difficulty in walking, not elsewhere classified: Secondary | ICD-10-CM | POA: Diagnosis not present

## 2023-11-10 DIAGNOSIS — M25672 Stiffness of left ankle, not elsewhere classified: Secondary | ICD-10-CM | POA: Diagnosis not present

## 2023-11-10 NOTE — Therapy (Signed)
 OUTPATIENT PHYSICAL THERAPY LOWER EXTREMITY TREATMENT   Patient Name: Haley White MRN: [id number] DOB:January 14, 1956, 68 y.o., female Today's Date: 11/10/2023  END OF SESSION:  PT End of Session - 11/10/23 1213     Visit Number 4    Number of Visits 16    Date for PT Re-Evaluation 12/26/23    PT Start Time 1206   pt arrived late   PT Stop Time 1245    PT Time Calculation (min) 39 min    Behavior During Therapy WFL for tasks assessed/performed             Past Medical History:  Diagnosis Date   Anxiety    Arthritis    Asthma    Cataract    Depression    Gout    Hyperlipidemia    Hypertension    Obese    Past Surgical History:  Procedure Laterality Date   ANKLE SURGERY Left    APPENDECTOMY  1984   APPENDECTOMY     CATARACT EXTRACTION W/ INTRAOCULAR LENS  IMPLANT, BILATERAL  2014   CESAREAN SECTION  1984, 1987, 1994   X3   CESAREAN SECTION     x3   COLONOSCOPY  11/16/2013   w/Brodie    EXTERNAL FIXATION LEG Left 06/04/2023   Procedure: EXTERNAL FIXATION LEG;  Surgeon: Barton Drape, MD;  Location: [facility];  Service: Orthopedics;  Laterality: Left;   EYE SURGERY Bilateral    lasix   FOOT FRACTURE SURGERY Left    FOOT SURGERY Left 2002   FRACTURE SURGERY Left    left ankle   KNEE SURGERY Left 1998   ORIF ANKLE FRACTURE Left 06/04/2023   Procedure: OPEN REDUCTION INTERNAL FIXATION (ORIF) ANKLE FRACTURE vs EX FIX;  Surgeon: Barton Drape, MD;  Location: [facility];  Service: Orthopedics;  Laterality: Left;   ROTATOR CUFF REPAIR Right 2007   shoulder     torn rotator cuff   TUBAL LIGATION  1994   TUBAL LIGATION     Patient Active Problem List   Diagnosis Date Noted   OSA on CPAP 08/11/2023   Ankle fracture 06/03/2023   Trimalleolar fracture of left ankle 06/02/2023   Acute left ankle pain 06/02/2023   Sleep-related hypoxia 03/24/2023   At risk for obstructive sleep apnea 03/24/2023   Acute pulmonary embolism (HCC) 05/10/2022   Allergic rhinitis  03/15/2021   Gastro-esophageal reflux disease without esophagitis 03/15/2021   Mild persistent asthma, uncomplicated 03/15/2021   Aortic atherosclerosis (HCC) by Chest CT scan on 02/02/2018  01/10/2021   Asthma    Low back pain 03/17/2019   Lumbosacral spondylosis without myelopathy 03/17/2019   Pseudogout 12/09/2017   Major depressive disorder, recurrent, severe without psychotic features (HCC)    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   Suicide attempt by drug ingestion (HCC)    Vitamin D  deficiency 08/22/2014   Hyperlipidemia, mixed 08/22/2014   Hypertension    Anxiety    Abnormal glucose    Class 2 severe obesity due to excess calories with serious comorbidity and body mass index (BMI) of 37.0 to 37.9 in adult [facility])     PCP: Elsie Richards MD  REFERRING PROVIDER: Barton Drape, MD   REFERRING DIAG: S82.852D (ICD-10-CM) - Displaced trimalleolar fracture of left lower leg, subsequent encounter for closed fracture with routine healing   THERAPY DIAG:  Closed displaced trimalleolar fracture of left ankle with routine healing, subsequent encounter  Pain in left ankle and joints of left foot  Stiffness of left ankle, not  elsewhere classified  Difficulty in walking, not elsewhere classified  Rationale for Evaluation and Treatment: Rehabilitation  ONSET DATE: July 2024  SUBJECTIVE:   SUBJECTIVE STATEMENT: Pt reports continued pain in back.  She didn't notice much difference with reg Rock tape application; lasted 24hrs.  She returns to surgeon 11/20/23; hoping to have surgery date for screw removal.   PERTINENT HISTORY: The patient is s/p left ankle trimalleolar & syndesmosis ORIF, DOS: 06/05/2023  PAIN:  Are you having pain? Yes: NPRS scale: current 4/10 L post lat ankle peroneal area; 7/10 lower back  Pain location: see above Pain description: sharpe ache Aggravating factors: being up on it; standing/walk 45 mins. Relieving factors: resting elevating,  icing  PRECAUTIONS: None wearing ankle brace from [facility] during day.  RED FLAGS: None   WEIGHT BEARING RESTRICTIONS: No  FALLS:  Has patient fallen in last 6 months? Yes. Number of falls 1 fell down wet hill walking dog  LIVING ENVIRONMENT: Lives with: lives alone Lives in: House/apartment Stairs: Yes: External: 3 steps; none Has following equipment at home: Single point cane   OCCUPATION: retired   PLOF: Independent  PATIENT GOALS: walk normally and as pain free as able  NEXT MD VISIT: Nov 12, 2022  OBJECTIVE:  Note: Objective measures were completed at Evaluation unless otherwise noted.  DIAGNOSTIC FINDINGS: IMPRESSION: 1. Acute trimalleolar fracture-subluxation as described above. 2. Additional small acute avulsion fracture of the anterolateral tibial plafond.   MRI cervical spine (05/28/2023): Severe left C5-6 neuroforaminal narrowing impinging on exiting left C6 nerve roots. Mild to moderate right and mild left C6-7 neuroforaminal narrowing. C7-T1 with moderate right and mild left facet arthrosis without narrowing.    MRI lumbar spine (11/01/2022): Mild levoconvex scoliosis. Chronic mild anterior wedge compression fracture of T12, 22% anterior body height loss and chronic anterior wedge compression deformity of L1 with 36% anterior body height loss. Moderate left L4-5 neuroforaminal narrowing with contact and mild dorsal lateral displacement of exiting left L4 nerve roots. Mild right L3-4 neuroforaminal narrowing. Mild multilevel retrolisthesis.     Assessment/Plan  Patient returns for follow-up and review of EMG/nerve conduction study. It demonstrates mild carpal tunnel syndrome but no evidence of cervical radiculopathy. Given EMG findings and no improvement of her symptoms with cervical epidural steroid injection, I suspect that her pain does not originate from her cervical spine. Physical exam is notable for marked tenderness over the lateral epicondyle and pain  with resisted wrist extension; I suspect that she may have a degree of lateral epicondylitis. Would recommend return to Dr. Shari for evaluation of this pain as well as mild carpal tunnel syndrome. She does have a history of fibromyalgia which I believe complicates her pain presentation. Will also start her on Lyrica 25 mg twice daily. May uptitrate to 50 mg twice daily if no improvement. Follow-up for medication titration in 6 to 8 weeks.    PATIENT SURVEYS:  FOTO Primary score 40%; risk adjusted 46% with goal of 59%  COGNITION: Overall cognitive status: Within functional limits for tasks assessed     SENSATION: WFL  EDEMA:  Mod to minimal post left lat malleolus     PALPATION: Moderate TTP throughout left lat malleolus, peroneals and distal acchilles Minimal TTP medial malleolus at point of hardware  LOWER EXTREMITY ROM:  Active ROM Right eval Left eval  Hip flexion    Hip extension    Hip abduction    Hip adduction    Hip internal rotation    Hip  external rotation    Knee flexion    Knee extension    Ankle dorsiflexion  -8  Ankle plantarflexion  30d  Ankle inversion  Limited by 50%  Ankle eversion  Limited by 50%   (Blank rows = not tested)  LOWER EXTREMITY MMT:  MMT Right eval Left eval  Hip flexion    Hip extension    Hip abduction    Hip adduction    Hip internal rotation    Hip external rotation    Knee flexion    Knee extension    Ankle dorsiflexion 5 2+  Ankle plantarflexion 5 3-  Ankle inversion 5 3-  Ankle eversion 5 3-   (Blank rows = not tested)  GAIT: Distance walked: 500 ft Assistive device utilized: Single point cane Level of assistance: Complete Independence Comments: antalgic limp offset right                                                                                                                                TREATMENT DATE:  Pt seen for aquatic therapy today.  Treatment took place in water 3.5-4.75 ft in depth at the  [facility] pool. Temp of water was 91.  Pt entered/exited the pool via stairs independently in step-to pattern with bilat rail.  (Facing hot tub during session)  * straddling yellow noodle:  3 rounds of cycling, hip abdct/ addct * unsupported  multiple laps forward/ backward with cues for heel/toe and toe/ heel ( some increased pain in dorsum of L foot with toe to heel motion) * side stepping R/L with/without rainbow hand floats  * holding wall with UE: gentle, partial heel/toe raises (painful in ankle) * hands on bench in water:  plank -> gentle hip ext x 10 each LE * seated on bench:  ankle circles, ankle eversion/inversions; alternating LAQ with DF x 10 * ankle DF ROM with foot on 2nd step and forward lunges x 10, each leg on step * 3 way LE stretch with hollow blue noodle, each LE  Pt requires the buoyancy and hydrostatic pressure of water for support, and to offload joints by unweighting joint load by at least 50 % in navel deep water and by at least 75-80% in chest to neck deep water.  Viscosity of the water is needed for resistance of strengthening. Water current perturbations provides challenge to standing balance requiring increased core activation.  PATIENT EDUCATION:  Education details: reacquainting to aquatic therapy Person educated: Patient Education method: Explanation Education comprehension: verbalized understanding  HOME EXERCISE PROGRAM: Access Code: [email or web address] URL: [email or web address] Date: 10/28/2023 Prepared by: Matilda Kohut  Exercises - Seated Ankle Alphabet  - 3 x daily - 7 x weekly - 1 sets - 10 reps - Seated Heel Raise  - 2-3 x daily - 7 x weekly - 2 sets - 10 reps - Seated Toe Raise  - 2-3 x daily - 7 x weekly - 2 sets -  10 reps  ASSESSMENT:  CLINICAL IMPRESSION: Pt had difficulty tolerated backward walking today; reported increased pain in Lt dorsum of foot at metatarsal heads. She liked the 3 way LE stretch. Overall good  toleration of water exercise, as long as it was performed slowly.   Goals are ongoing.   No improvement in sensitivity or pain with application of tape; not reapplied at end of session.    From initial impression:  Patient is a 68 y.o. f who was seen today for physical therapy evaluation and treatment for s/p left trimalleolar fx with repair. She is WBAT using a cane, sometimes a walker (if increase pain) and wearing and ankle brace when up during day.  She has moderate pain sensitivity with palpation throughout L lat malleolar area with particular discomfort about peroneal tendons.  She is limited in ankle ROM all planes limited > by pain than edema.  Edema minimal.  She will benefit from skilled PT to improve strength and ROM of left ankle improving gait , decreasing pain and returning pt to PLOF.  OBJECTIVE IMPAIRMENTS: Abnormal gait, decreased activity tolerance, decreased balance, difficulty walking, decreased ROM, decreased strength, obesity, and pain.   ACTIVITY LIMITATIONS: squatting, stairs, and locomotion level  PARTICIPATION LIMITATIONS: shopping and community activity  PERSONAL FACTORS: Past/current experiences and 1 comorbidity: back dysfunction  are also affecting patient's functional outcome.   REHAB POTENTIAL: Good  CLINICAL DECISION MAKING: Evolving/moderate complexity  EVALUATION COMPLEXITY: Moderate   GOALS: Goals reviewed with patient? Yes  SHORT TERM GOALS: Target date: 11/24/23 Pt will tolerate full aquatic sessions consistently without increase in pain and with improving function to demonstrate good toleration and effectiveness of intervention.  Baseline: Goal status: INITIAL  2.  Pt will navigate stair into and out of pool 6 steps using step through pattern Baseline: step-to pattern  Goal status: In progress 11/10/23  3.  Pt will report reduction in pain by 3 NPRS post aquatic session which lasts for up to 24 hours. Baseline:  Goal status: INITIAL  4.  Pt  will amb without antalgic gait pattern submerged in 3.6 ft Baseline:  Goal status: INITIAL   LONG TERM GOALS: Target date: 12/26/23  Pt to will improve foto score by at least 12 % to demonstrate improved perception of function. Baseline: 40% primary score Goal status: INITIAL  2.  Pt wiil navigate 1 flight of steps using step alternating pattern indep Baseline:  Goal status: INITIAL  3.  Pt will improve on left ankle ROM to 75% of normal Baseline: see chart Goal status: INITIAL  4.  Pt will improve on left ankle DF and PF strength by at least 1 grade to demonstrate improving functional ability Baseline: see chart Goal status: INITIAL  5.  Pt will amb without AD without limitation to pain Baseline:using cane or walker  Goal status: INITIAL    PLAN:  PT FREQUENCY: 1-2x/week  PT DURATION: 8 weeks  PLANNED INTERVENTIONS: 97164- PT Re-evaluation, 97110-Therapeutic exercises, 97530- Therapeutic activity, 97112- Neuromuscular re-education, 97535- Self Care, [id number]- Manual therapy, [phone]- Gait training, [phone]- Orthotic Fit/training, [phone]- Aquatic Therapy, Patient/Family education, Balance training, Stair training, Taping, Dry Needling, Joint mobilization, DME instructions, Cryotherapy, and Moist heat  PLAN FOR NEXT SESSION: ankle ROM, strengthening.  Gait retraining, balance retraining  Delon Aquas, PTA 11/10/23 1:02 PM [facility] [address] [place], [place], [address] Phone: [phone]   Fax:  [phone]

## 2023-11-12 ENCOUNTER — Encounter (HOSPITAL_BASED_OUTPATIENT_CLINIC_OR_DEPARTMENT_OTHER): Payer: Self-pay | Admitting: Physical Therapy

## 2023-11-12 ENCOUNTER — Ambulatory Visit (HOSPITAL_BASED_OUTPATIENT_CLINIC_OR_DEPARTMENT_OTHER): Payer: Medicare HMO | Admitting: Physical Therapy

## 2023-11-12 DIAGNOSIS — M25572 Pain in left ankle and joints of left foot: Secondary | ICD-10-CM | POA: Diagnosis not present

## 2023-11-12 DIAGNOSIS — F331 Major depressive disorder, recurrent, moderate: Secondary | ICD-10-CM | POA: Diagnosis not present

## 2023-11-12 DIAGNOSIS — R262 Difficulty in walking, not elsewhere classified: Secondary | ICD-10-CM

## 2023-11-12 DIAGNOSIS — S82852D Displaced trimalleolar fracture of left lower leg, subsequent encounter for closed fracture with routine healing: Secondary | ICD-10-CM | POA: Diagnosis not present

## 2023-11-12 DIAGNOSIS — M25672 Stiffness of left ankle, not elsewhere classified: Secondary | ICD-10-CM | POA: Diagnosis not present

## 2023-11-12 NOTE — Therapy (Signed)
 OUTPATIENT PHYSICAL THERAPY LOWER EXTREMITY TREATMENT   Patient Name: Haley White MRN: [id number] DOB:12/10/1955, 68 y.o., female Today's Date: 11/12/2023  END OF SESSION:  PT End of Session - 11/12/23 0903     Visit Number 5    Number of Visits 16    Date for PT Re-Evaluation 12/26/23    Authorization Type aetna mcr    PT Start Time 0900    PT Stop Time 0945    PT Time Calculation (min) 45 min    Activity Tolerance Patient tolerated treatment well    Behavior During Therapy WFL for tasks assessed/performed             Past Medical History:  Diagnosis Date   Anxiety    Arthritis    Asthma    Cataract    Depression    Gout    Hyperlipidemia    Hypertension    Obese    Past Surgical History:  Procedure Laterality Date   ANKLE SURGERY Left    APPENDECTOMY  1984   APPENDECTOMY     CATARACT EXTRACTION W/ INTRAOCULAR LENS  IMPLANT, BILATERAL  2014   CESAREAN SECTION  1984, 1987, 1994   X3   CESAREAN SECTION     x3   COLONOSCOPY  11/16/2013   w/Brodie    EXTERNAL FIXATION LEG Left 06/04/2023   Procedure: EXTERNAL FIXATION LEG;  Surgeon: Barton Drape, MD;  Location: [facility];  Service: Orthopedics;  Laterality: Left;   EYE SURGERY Bilateral    lasix   FOOT FRACTURE SURGERY Left    FOOT SURGERY Left 2002   FRACTURE SURGERY Left    left ankle   KNEE SURGERY Left 1998   ORIF ANKLE FRACTURE Left 06/04/2023   Procedure: OPEN REDUCTION INTERNAL FIXATION (ORIF) ANKLE FRACTURE vs EX FIX;  Surgeon: Barton Drape, MD;  Location: [facility];  Service: Orthopedics;  Laterality: Left;   ROTATOR CUFF REPAIR Right 2007   shoulder     torn rotator cuff   TUBAL LIGATION  1994   TUBAL LIGATION     Patient Active Problem List   Diagnosis Date Noted   OSA on CPAP 08/11/2023   Ankle fracture 06/03/2023   Trimalleolar fracture of left ankle 06/02/2023   Acute left ankle pain 06/02/2023   Sleep-related hypoxia 03/24/2023   At risk for obstructive sleep apnea 03/24/2023    Acute pulmonary embolism (HCC) 05/10/2022   Allergic rhinitis 03/15/2021   Gastro-esophageal reflux disease without esophagitis 03/15/2021   Mild persistent asthma, uncomplicated 03/15/2021   Aortic atherosclerosis (HCC) by Chest CT scan on 02/02/2018  01/10/2021   Asthma    Low back pain 03/17/2019   Lumbosacral spondylosis without myelopathy 03/17/2019   Pseudogout 12/09/2017   Major depressive disorder, recurrent, severe without psychotic features (HCC)    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)     PCP: Elsie Richards MD  REFERRING PROVIDER: Barton Drape, MD   REFERRING DIAG: S82.852D (ICD-10-CM) - Displaced trimalleolar fracture of left lower leg, subsequent encounter for closed fracture with routine healing   THERAPY DIAG:  Closed displaced trimalleolar fracture of left ankle with routine healing, subsequent encounter  Pain in left  ankle and joints of left foot  Stiffness of left ankle, not elsewhere classified  Difficulty in walking, not elsewhere classified  Rationale for Evaluation and Treatment: Rehabilitation  ONSET DATE: July 2024  SUBJECTIVE:   SUBJECTIVE STATEMENT: Pt reports elevated general pain due to weather changing. Persistent pain midline dorsal area of left foot  PERTINENT HISTORY: The patient is s/p left ankle trimalleolar & syndesmosis ORIF, DOS: 06/05/2023  PAIN:  Are you having pain? Yes: NPRS scale: current 4-5/10 L post lat ankle peroneal area; 7/10 lower back  Pain location: see above Pain description: sharpe ache Aggravating factors: being up on it; standing/walk 45 mins. Relieving factors: resting elevating, icing  PRECAUTIONS: None wearing  ankle brace from [facility] during day.  RED FLAGS: None   WEIGHT BEARING RESTRICTIONS: No  FALLS:  Has patient fallen in last 6 months? Yes. Number of falls 1 fell down wet hill walking dog  LIVING ENVIRONMENT: Lives with: lives alone Lives in: House/apartment Stairs: Yes: External: 3 steps; none Has following equipment at home: Single point cane   OCCUPATION: retired   PLOF: Independent  PATIENT GOALS: walk normally and as pain free as able  NEXT MD VISIT: Nov 12, 2022  OBJECTIVE:  Note: Objective measures were completed at Evaluation unless otherwise noted.  DIAGNOSTIC FINDINGS: IMPRESSION: 1. Acute trimalleolar fracture-subluxation as described above. 2. Additional small acute avulsion fracture of the anterolateral tibial plafond.   MRI cervical spine (05/28/2023): Severe left C5-6 neuroforaminal narrowing impinging on exiting left C6 nerve roots. Mild to moderate right and mild left C6-7 neuroforaminal narrowing. C7-T1 with moderate right and mild left facet arthrosis without narrowing.    MRI lumbar spine (11/01/2022): Mild levoconvex scoliosis. Chronic mild anterior wedge compression fracture of T12, 22% anterior body height loss and chronic anterior wedge compression deformity of L1 with 36% anterior body height loss. Moderate left L4-5 neuroforaminal narrowing with contact and mild dorsal lateral displacement of exiting left L4 nerve roots. Mild right L3-4 neuroforaminal narrowing. Mild multilevel retrolisthesis.     Assessment/Plan  Patient returns for follow-up and review of EMG/nerve conduction study. It demonstrates mild carpal tunnel syndrome but no evidence of cervical radiculopathy. Given EMG findings and no improvement of her symptoms with cervical epidural steroid injection, I suspect that her pain does not originate from her cervical spine. Physical exam is notable for marked tenderness over the lateral epicondyle and pain with resisted wrist extension; I  suspect that she may have a degree of lateral epicondylitis. Would recommend return to Dr. Shari for evaluation of this pain as well as mild carpal tunnel syndrome. She does have a history of fibromyalgia which I believe complicates her pain presentation. Will also start her on Lyrica 25 mg twice daily. May uptitrate to 50 mg twice daily if no improvement. Follow-up for medication titration in 6 to 8 weeks.    PATIENT SURVEYS:  FOTO Primary score 40%; risk adjusted 46% with goal of 59%  COGNITION: Overall cognitive status: Within functional limits for tasks assessed     SENSATION: WFL  EDEMA:  Mod to minimal post left lat malleolus     PALPATION: Moderate TTP throughout left lat malleolus, peroneals and distal acchilles Minimal TTP medial malleolus at point of hardware  LOWER EXTREMITY ROM:  Active ROM Right eval Left eval  Hip flexion    Hip extension    Hip abduction    Hip adduction    Hip internal rotation    Hip external rotation  Knee flexion    Knee extension    Ankle dorsiflexion  -8  Ankle plantarflexion  30d  Ankle inversion  Limited by 50%  Ankle eversion  Limited by 50%   (Blank rows = not tested)  LOWER EXTREMITY MMT:  MMT Right eval Left eval  Hip flexion    Hip extension    Hip abduction    Hip adduction    Hip internal rotation    Hip external rotation    Knee flexion    Knee extension    Ankle dorsiflexion 5 2+  Ankle plantarflexion 5 3-  Ankle inversion 5 3-  Ankle eversion 5 3-   (Blank rows = not tested)  GAIT: Distance walked: 500 ft Assistive device utilized: Single point cane Level of assistance: Complete Independence Comments: antalgic limp offset right                                                                                                                                TREATMENT DATE:  Pt seen for aquatic therapy today.  Treatment took place in water 3.5-4.75 ft in depth at the [facility] pool. Temp of  water was 91.  Pt entered/exited the pool via stairs independently in step-to pattern with bilat rail.  (Facing hot tub during session)  * straddling yellow noodle:  3 rounds of cycling, hip abdct/ addct * unsupported  multiple laps forward/ backward with cues for heel/toe and toe/ heel ( some increased pain in dorsum of L foot with toe to heel motion) * side stepping R/L with/without rainbow hand floats  * 3 way LE stretch with hollow blue noodle, each LE * holding wall with UE: gentle, partial heel/toe raises (not tolerated) * seated on bench:  ankle circles, ankle eversion/inversions; alternating LAQ with DF x 10 * standing ue support on wall gentle hip ext x 10 each LE  Pt requires the buoyancy and hydrostatic pressure of water for support, and to offload joints by unweighting joint load by at least 50 % in navel deep water and by at least 75-80% in chest to neck deep water.  Viscosity of the water is needed for resistance of strengthening. Water current perturbations provides challenge to standing balance requiring increased core activation.  PATIENT EDUCATION:  Education details: reacquainting to aquatic therapy Person educated: Patient Education method: Explanation Education comprehension: verbalized understanding  HOME EXERCISE PROGRAM: Access Code: [email or web address] URL: [email or web address] Date: 10/28/2023 Prepared by: Matilda Kohut  Exercises - Seated Ankle Alphabet  - 3 x daily - 7 x weekly - 1 sets - 10 reps - Seated Heel Raise  - 2-3 x daily - 7 x weekly - 2 sets - 10 reps - Seated Toe Raise  - 2-3 x daily - 7 x weekly - 2 sets - 10 reps  ASSESSMENT:  CLINICAL IMPRESSION: Pt reports increase in edema yesterday left foot, resolved mostly today.  She is encouraged to use her compression stockings  in future to decrease issue.  She has heighten pain sensitivity today with difficulty tolerating left ankle movement with any load or resistance. Pt edu on xrays from fx  and reconstruction.  She vu.  Goals ongoing.     From initial impression:  Patient is a 68 y.o. f who was seen today for physical therapy evaluation and treatment for s/p left trimalleolar fx with repair. She is WBAT using a cane, sometimes a walker (if increase pain) and wearing and ankle brace when up during day.  She has moderate pain sensitivity with palpation throughout L lat malleolar area with particular discomfort about peroneal tendons.  She is limited in ankle ROM all planes limited > by pain than edema.  Edema minimal.  She will benefit from skilled PT to improve strength and ROM of left ankle improving gait , decreasing pain and returning pt to PLOF.  OBJECTIVE IMPAIRMENTS: Abnormal gait, decreased activity tolerance, decreased balance, difficulty walking, decreased ROM, decreased strength, obesity, and pain.   ACTIVITY LIMITATIONS: squatting, stairs, and locomotion level  PARTICIPATION LIMITATIONS: shopping and community activity  PERSONAL FACTORS: Past/current experiences and 1 comorbidity: back dysfunction  are also affecting patient's functional outcome.   REHAB POTENTIAL: Good  CLINICAL DECISION MAKING: Evolving/moderate complexity  EVALUATION COMPLEXITY: Moderate   GOALS: Goals reviewed with patient? Yes  SHORT TERM GOALS: Target date: 11/24/23 Pt will tolerate full aquatic sessions consistently without increase in pain and with improving function to demonstrate good toleration and effectiveness of intervention.  Baseline: Goal status: INITIAL  2.  Pt will navigate stair into and out of pool 6 steps using step through pattern Baseline: step-to pattern  Goal status: In progress 11/10/23  3.  Pt will report reduction in pain by 3 NPRS post aquatic session which lasts for up to 24 hours. Baseline:  Goal status: INITIAL  4.  Pt will amb without antalgic gait pattern submerged in 3.6 ft Baseline:  Goal status: INITIAL   LONG TERM GOALS: Target date: 12/26/23  Pt  to will improve foto score by at least 12 % to demonstrate improved perception of function. Baseline: 40% primary score Goal status: INITIAL  2.  Pt wiil navigate 1 flight of steps using step alternating pattern indep Baseline:  Goal status: INITIAL  3.  Pt will improve on left ankle ROM to 75% of normal Baseline: see chart Goal status: INITIAL  4.  Pt will improve on left ankle DF and PF strength by at least 1 grade to demonstrate improving functional ability Baseline: see chart Goal status: INITIAL  5.  Pt will amb without AD without limitation to pain Baseline:using cane or walker  Goal status: INITIAL    PLAN:  PT FREQUENCY: 1-2x/week  PT DURATION: 8 weeks  PLANNED INTERVENTIONS: 97164- PT Re-evaluation, 97110-Therapeutic exercises, 97530- Therapeutic activity, 97112- Neuromuscular re-education, 97535- Self Care, 02859- Manual therapy, [phone]- Gait training, [phone]- Orthotic Fit/training, [phone]- Aquatic Therapy, Patient/Family education, Balance training, Stair training, Taping, Dry Needling, Joint mobilization, DME instructions, Cryotherapy, and Moist heat  PLAN FOR NEXT SESSION: ankle ROM, strengthening.  Gait retraining, balance retraining  Victoire Deans) Ashwin Tibbs MPT 11/12/23 9:05 AM [facility] [address] [place], [place], [address] Phone: [phone]   Fax:  [phone]

## 2023-11-13 ENCOUNTER — Encounter: Payer: Self-pay | Admitting: Internal Medicine

## 2023-11-13 ENCOUNTER — Ambulatory Visit: Payer: Medicare HMO | Admitting: Internal Medicine

## 2023-11-13 VITALS — BP 135/83 | HR 89 | Ht 63.0 in | Wt 229.0 lb

## 2023-11-13 DIAGNOSIS — G4733 Obstructive sleep apnea (adult) (pediatric): Secondary | ICD-10-CM

## 2023-11-13 DIAGNOSIS — J4530 Mild persistent asthma, uncomplicated: Secondary | ICD-10-CM

## 2023-11-13 DIAGNOSIS — J301 Allergic rhinitis due to pollen: Secondary | ICD-10-CM

## 2023-11-13 DIAGNOSIS — K219 Gastro-esophageal reflux disease without esophagitis: Secondary | ICD-10-CM | POA: Diagnosis not present

## 2023-11-13 NOTE — Progress Notes (Signed)
 Haley White    [id number]    [birth year]  Primary Care Physician:McKeown, Elsie, MD Date of Appointment: 11/13/2023 Established Patient Visit  Chief complaint:   Chief Complaint  Patient presents with   Follow-up    Pt states she has been doing good. Denies any concerns, using inhalers      HPI: Haley White is a 68 y.o. woman with moderate persistent asthma and covid 19 infection in June 2023. History of provoked pulmonary embolism with covid, treated with six months of eliquis .   Interval Updates: Here for follow up for asthma.   Had asthma exacerbation in October 2024 - seen for sick visit with steroids.   Also had ankle fracture in July 2024 requiring surgical repair and outpatient rehab.   She is staying in a hotel for the past two months due to water damage and mold in her house.   Wearing cpap - having trouble with mask fitting. Having lots of leak. Was fitted for a new mask in October - compliance report from October 2024 reviewed showed ongoing AHI>8 and leak 25.3L/min. 87% adherence.  New mask hasn't been working well -she hasn't been wearing cpap much since October.   Current Regimen: 2 puffs in the morning. Asthma Triggers: URIs Exacerbations in the last year: once History of hospitalization or intubation: none Allergy  Testing/rhinitis: yes on allegra, singulair , flonase (follows at [facility] ) GERD: ACT:  Asthma Control Test ACT Total Score  11/13/2023 11:06 AM 24  03/13/2023  9:34 AM 20  04/17/2022  2:30 PM 15   FeNO: Serum Eos/IgE:   I have reviewed the patient's family social and past medical history and updated as appropriate.   Past Medical History:  Diagnosis Date   Anxiety    Arthritis    Asthma    Cataract    Depression    Gout    Hyperlipidemia    Hypertension    Obese     Past Surgical History:  Procedure Laterality Date   ANKLE SURGERY Left    APPENDECTOMY  1984   APPENDECTOMY     CATARACT EXTRACTION W/  INTRAOCULAR LENS  IMPLANT, BILATERAL  2014   CESAREAN SECTION  1984, 1987, 1994   X3   CESAREAN SECTION     x3   COLONOSCOPY  11/16/2013   w/Brodie    EXTERNAL FIXATION LEG Left 06/04/2023   Procedure: EXTERNAL FIXATION LEG;  Surgeon: Barton Drape, MD;  Location: [facility];  Service: Orthopedics;  Laterality: Left;   EYE SURGERY Bilateral    lasix   FOOT FRACTURE SURGERY Left    FOOT SURGERY Left 2002   FRACTURE SURGERY Left    left ankle   KNEE SURGERY Left 1998   ORIF ANKLE FRACTURE Left 06/04/2023   Procedure: OPEN REDUCTION INTERNAL FIXATION (ORIF) ANKLE FRACTURE vs EX FIX;  Surgeon: Barton Drape, MD;  Location: [facility];  Service: Orthopedics;  Laterality: Left;   ROTATOR CUFF REPAIR Right 2007   shoulder     torn rotator cuff   TUBAL LIGATION  1994   TUBAL LIGATION      Family History  Problem Relation Age of Onset   Dementia Father    Anxiety disorder Sister    Colon cancer Neg Hx    Colon polyps Neg Hx    Esophageal cancer Neg Hx    Stomach cancer Neg Hx    Rectal cancer Neg Hx     Social History  Occupational History   Not on file  Tobacco Use   Smoking status: Never   Smokeless tobacco: Never  Vaping Use   Vaping status: Never Used  Substance and Sexual Activity   Alcohol use: Yes    Comment: occasionally   Drug use: Never    Comment: 06/03/2022-gummies   Sexual activity: Yes    Birth control/protection: Post-menopausal, Surgical     Physical Exam: Blood pressure 135/83, pulse 89, height 5' 3 (1.6 m), weight 229 lb (103.9 kg), SpO2 96%.  Gen:      No distress ENT:  MMM  Lungs:    ctab no wheeze CV:         RRR no mrg  Data Reviewed: Imaging: I have personally reviewed the CT Chest which shows resolved GGO from previous covid infection. Acute right sided PE without right heart strain.   PFTs:     Latest Ref Rng & Units 06/14/2022   11:52 AM  PFT Results  FVC-Pre L 3.00   FVC-Predicted Pre % 102   FVC-Post L 2.98    FVC-Predicted Post % 101   Pre FEV1/FVC % % 84   Post FEV1/FCV % % 87   FEV1-Pre L 2.53   FEV1-Predicted Pre % 113   FEV1-Post L 2.58   DLCO uncorrected ml/min/mmHg 17.01   DLCO UNC% % 91   DLCO corrected ml/min/mmHg 18.78   DLCO COR %Predicted % 101   DLVA Predicted % 102   TLC L 5.07   TLC % Predicted % 106   RV % Predicted % 101    I have personally reviewed the patient's PFTs and they show normal pulmonary function.   Labs: Lab Results  Component Value Date   WBC 7.7 10/24/2023   HGB 13.0 10/24/2023   HCT 39.9 10/24/2023   MCV 94.1 10/24/2023   PLT 297 10/24/2023   Lab Results  Component Value Date   NA 143 10/24/2023   K 4.3 10/24/2023   CL 106 10/24/2023   CO2 27 10/24/2023     Immunization status: Immunization History  Administered Date(s) Administered   Influenza, High Dose Seasonal PF 10/17/2017   Influenza, Quadrivalent, Recombinant, Inj, Pf 08/20/2018   Influenza,inj,Quad PF,6+ Mos 09/17/2017   Influenza-Unspecified 08/20/2018   PFIZER(Purple Top)SARS-COV-2 Vaccination 01/20/2020, 02/10/2020, 03/27/2020   PPD Test 10/08/2017, 11/29/2019, 01/10/2021   Pneumococcal Conjugate-13 11/05/1995   Pneumococcal Polysaccharide-23 10/08/2017, 06/15/2021   Td 11/29/2019   Tdap 11/04/2008, 03/28/2020    External Records Personally Reviewed: hospital stay  Assessment:  Mild intermittent asthma, recent exacerbation Gerd, controlled Seasonal allergies, controlled OSA on CPAP  Plan/Recommendations: I will order a mask of best fit for your sleep apnea - we may need to consider in lab study if this doesn't work.  I will reach out to Dr. Chalice if she has any other thoughts.   Continue symbicort 2 puffs once daily, gargle after use.   If you are sick or could get sick start taking symbicort regularly 2 puffs twice a day an extend for three more days after you are feeling better.   Continue reflux and allergy  medications.   Return to Care: Return in about  3 months (around 02/11/2024).   Verdon Gore, MD Pulmonary and Critical Care Medicine [facility] Office:[phone]

## 2023-11-13 NOTE — Patient Instructions (Addendum)
 It was a pleasure to see you today!  Please schedule follow up scheduled with myself in 3 months - virtual visit.  If my schedule is not open yet, we will contact you with a reminder closer to that time. Please call [phone] if you haven't heard from us  a month before, and always call us  sooner if issues or concerns arise. You can also send us  a message through MyChart, but but aware that this is not to be used for urgent issues and it may take up to 5-7 days to receive a reply. Please be aware that you will likely be able to view your results before I have a chance to respond to them. Please give us  5 business days to respond to any non-urgent results.   I will order a mask of best fit for your sleep apnea - we may need to consider in lab study if this doesn't work.  I will reach out to Dr. Chalice if she has any other thoughts.   Continue symbicort 2 puffs once daily, gargle after use.   If you are sick or could get sick start taking symbicort regularly 2 puffs twice a day an extend for three more days after you are feeling better.   Continue reflux and allergy  medications.

## 2023-11-14 DIAGNOSIS — G4733 Obstructive sleep apnea (adult) (pediatric): Secondary | ICD-10-CM | POA: Diagnosis not present

## 2023-11-17 ENCOUNTER — Encounter (HOSPITAL_BASED_OUTPATIENT_CLINIC_OR_DEPARTMENT_OTHER): Payer: Self-pay | Admitting: Physical Therapy

## 2023-11-17 ENCOUNTER — Ambulatory Visit (HOSPITAL_BASED_OUTPATIENT_CLINIC_OR_DEPARTMENT_OTHER): Payer: Medicare HMO | Admitting: Physical Therapy

## 2023-11-17 DIAGNOSIS — M25572 Pain in left ankle and joints of left foot: Secondary | ICD-10-CM | POA: Diagnosis not present

## 2023-11-17 DIAGNOSIS — S82852D Displaced trimalleolar fracture of left lower leg, subsequent encounter for closed fracture with routine healing: Secondary | ICD-10-CM

## 2023-11-17 DIAGNOSIS — R262 Difficulty in walking, not elsewhere classified: Secondary | ICD-10-CM | POA: Diagnosis not present

## 2023-11-17 DIAGNOSIS — M25672 Stiffness of left ankle, not elsewhere classified: Secondary | ICD-10-CM | POA: Diagnosis not present

## 2023-11-17 NOTE — Therapy (Signed)
 OUTPATIENT PHYSICAL THERAPY LOWER EXTREMITY TREATMENT   Patient Name: Haley White MRN: [id number] DOB:1956/01/13, 68 y.o., female Today's Date: 11/17/2023  END OF SESSION:  PT End of Session - 11/17/23 1144     Visit Number 6    Number of Visits 16    Date for PT Re-Evaluation 12/26/23    Authorization Type aetna mcr    PT Start Time 0815    PT Stop Time 0855    PT Time Calculation (min) 40 min    Activity Tolerance Patient tolerated treatment well    Behavior During Therapy WFL for tasks assessed/performed              Past Medical History:  Diagnosis Date   Anxiety    Arthritis    Asthma    Cataract    Depression    Gout    Hyperlipidemia    Hypertension    Obese    Past Surgical History:  Procedure Laterality Date   ANKLE SURGERY Left    APPENDECTOMY  1984   APPENDECTOMY     CATARACT EXTRACTION W/ INTRAOCULAR LENS  IMPLANT, BILATERAL  2014   CESAREAN SECTION  1984, 1987, 1994   X3   CESAREAN SECTION     x3   COLONOSCOPY  11/16/2013   w/Brodie    EXTERNAL FIXATION LEG Left 06/04/2023   Procedure: EXTERNAL FIXATION LEG;  Surgeon: Barton Drape, MD;  Location: [facility];  Service: Orthopedics;  Laterality: Left;   EYE SURGERY Bilateral    lasix   FOOT FRACTURE SURGERY Left    FOOT SURGERY Left 2002   FRACTURE SURGERY Left    left ankle   KNEE SURGERY Left 1998   ORIF ANKLE FRACTURE Left 06/04/2023   Procedure: OPEN REDUCTION INTERNAL FIXATION (ORIF) ANKLE FRACTURE vs EX FIX;  Surgeon: Barton Drape, MD;  Location: [facility];  Service: Orthopedics;  Laterality: Left;   ROTATOR CUFF REPAIR Right 2007   shoulder     torn rotator cuff   TUBAL LIGATION  1994   TUBAL LIGATION     Patient Active Problem List   Diagnosis Date Noted   OSA on CPAP 08/11/2023   Ankle fracture 06/03/2023   Trimalleolar fracture of left ankle 06/02/2023   Acute left ankle pain 06/02/2023   Sleep-related hypoxia 03/24/2023   At risk for obstructive sleep apnea  03/24/2023   Acute pulmonary embolism (HCC) 05/10/2022   Allergic rhinitis 03/15/2021   Gastro-esophageal reflux disease without esophagitis 03/15/2021   Mild persistent asthma, uncomplicated 03/15/2021   Aortic atherosclerosis (HCC) by Chest CT scan on 02/02/2018  01/10/2021   Asthma    Low back pain 03/17/2019   Lumbosacral spondylosis without myelopathy 03/17/2019   Pseudogout 12/09/2017   Major depressive disorder, recurrent, severe without psychotic features (HCC)    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   Suicide attempt by drug ingestion (HCC)    Vitamin D  deficiency 08/22/2014   Hyperlipidemia, mixed 08/22/2014   Hypertension    Anxiety    Abnormal glucose    Class 2 severe obesity due to excess calories with serious comorbidity and body mass index (BMI) of 37.0 to 37.9 in adult [facility])     PCP: Elsie Richards MD  REFERRING PROVIDER: Barton Drape, MD   REFERRING DIAG: S82.852D (ICD-10-CM) - Displaced trimalleolar fracture of left lower leg, subsequent encounter for closed fracture with routine healing   THERAPY DIAG:  Closed displaced trimalleolar fracture of left ankle with routine healing, subsequent encounter  Pain in  left ankle and joints of left foot  Stiffness of left ankle, not elsewhere classified  Difficulty in walking, not elsewhere classified  Rationale for Evaluation and Treatment: Rehabilitation  ONSET DATE: July 2024  SUBJECTIVE:   SUBJECTIVE STATEMENT: Pt reports that her pain is not as high.  She has been wearing compression sock.  She returns to surgeon on Friday, to find out about hardware removal.  PERTINENT HISTORY: The patient is s/p left ankle trimalleolar & syndesmosis ORIF, DOS: 06/05/2023  PAIN:  Are you having pain? Yes: NPRS scale: current 2-3/10 L post lat ankle peroneal area;  Pain location: see above Pain description: sharpe ache Aggravating factors: being up on it; standing/walk 45 mins. Relieving factors: resting  elevating, icing  PRECAUTIONS: None wearing ankle brace from [facility] during day.  RED FLAGS: None   WEIGHT BEARING RESTRICTIONS: No  FALLS:  Has patient fallen in last 6 months? Yes. Number of falls 1 fell down wet hill walking dog  LIVING ENVIRONMENT: Lives with: lives alone Lives in: House/apartment Stairs: Yes: External: 3 steps; none Has following equipment at home: Single point cane   OCCUPATION: retired   PLOF: Independent  PATIENT GOALS: walk normally and as pain free as able  NEXT MD VISIT: Nov 12, 2022  OBJECTIVE:  Note: Objective measures were completed at Evaluation unless otherwise noted.  DIAGNOSTIC FINDINGS: IMPRESSION: 1. Acute trimalleolar fracture-subluxation as described above. 2. Additional small acute avulsion fracture of the anterolateral tibial plafond.   MRI cervical spine (05/28/2023): Severe left C5-6 neuroforaminal narrowing impinging on exiting left C6 nerve roots. Mild to moderate right and mild left C6-7 neuroforaminal narrowing. C7-T1 with moderate right and mild left facet arthrosis without narrowing.    MRI lumbar spine (11/01/2022): Mild levoconvex scoliosis. Chronic mild anterior wedge compression fracture of T12, 22% anterior body height loss and chronic anterior wedge compression deformity of L1 with 36% anterior body height loss. Moderate left L4-5 neuroforaminal narrowing with contact and mild dorsal lateral displacement of exiting left L4 nerve roots. Mild right L3-4 neuroforaminal narrowing. Mild multilevel retrolisthesis.     Assessment/Plan  Patient returns for follow-up and review of EMG/nerve conduction study. It demonstrates mild carpal tunnel syndrome but no evidence of cervical radiculopathy. Given EMG findings and no improvement of her symptoms with cervical epidural steroid injection, I suspect that her pain does not originate from her cervical spine. Physical exam is notable for marked tenderness over the lateral  epicondyle and pain with resisted wrist extension; I suspect that she may have a degree of lateral epicondylitis. Would recommend return to Dr. Shari for evaluation of this pain as well as mild carpal tunnel syndrome. She does have a history of fibromyalgia which I believe complicates her pain presentation. Will also start her on Lyrica 25 mg twice daily. May uptitrate to 50 mg twice daily if no improvement. Follow-up for medication titration in 6 to 8 weeks.    PATIENT SURVEYS:  FOTO Primary score 40%; risk adjusted 46% with goal of 59%  COGNITION: Overall cognitive status: Within functional limits for tasks assessed     SENSATION: WFL  EDEMA:  Mod to minimal post left lat malleolus     PALPATION: Moderate TTP throughout left lat malleolus, peroneals and distal acchilles Minimal TTP medial malleolus at point of hardware  LOWER EXTREMITY ROM:  Active ROM Right eval Left eval Left  11/17/23  Hip flexion     Hip extension     Hip abduction     Hip  adduction     Hip internal rotation     Hip external rotation     Knee flexion     Knee extension     Ankle dorsiflexion  -8 Lacking 2  Ankle plantarflexion  30d 42  Ankle inversion  Limited by 50%   Ankle eversion  Limited by 50%    (Blank rows = not tested)  LOWER EXTREMITY MMT:  MMT Right eval Left eval Left  Hip flexion     Hip extension     Hip abduction     Hip adduction     Hip internal rotation     Hip external rotation     Knee flexion     Knee extension     Ankle dorsiflexion 5 2+   Ankle plantarflexion 5 3-   Ankle inversion 5 3-   Ankle eversion 5 3-    (Blank rows = not tested)  GAIT: Distance walked: 500 ft Assistive device utilized: Single point cane Level of assistance: Complete Independence Comments: antalgic limp offset right                                                                                                                                TREATMENT DATE:  Pt seen for  aquatic therapy today.  Treatment took place in water 3.5-4.75 ft in depth at the [facility] pool. Temp of water was 91.  Pt entered/exited the pool via stairs independently in step-to pattern with bilat rail.  (Facing hot tub during session)  * unsupported in 63ft6 multiple laps forward/ backward with cues for heel/toe and toe/ heel ( NO pain in dorsum of L foot with toe to heel motion) * side stepping R/L with/without rainbow hand floats  * walking forward with reciprocal arm swing with light resistance  bells * L stretch at wall * UE on wall (4+ ft): heel raises (range to tolerance) ; hip abdct/ addct x 10 each  * step forward/ step backward with BUE on yellow hand floats for work on balance, weight shift and change in directions * farmer carry with yellow hand floats at side with marching forward / backwards -> rainbow hand floats * UE on rainbow hand floats: forward walking kick  * straddling yellow noodle:  2 laps of cycling - increase in pain in foot  * forward step ups/ step down with UE on rails x 4 up/down - painful/difficult  Pt requires the buoyancy and hydrostatic pressure of water for support, and to offload joints by unweighting joint load by at least 50 % in navel deep water and by at least 75-80% in chest to neck deep water.  Viscosity of the water is needed for resistance of strengthening. Water current perturbations provides challenge to standing balance requiring increased core activation.  PATIENT EDUCATION:  Education details: reacquainting to aquatic therapy Person educated: Patient Education method: Explanation Education comprehension: verbalized understanding  HOME EXERCISE PROGRAM: Access Code:  [email or web address] URL: [email or web address] Date: 10/28/2023 Prepared by: Matilda Kohut  Exercises - Seated Ankle Alphabet  - 3 x daily - 7 x weekly - 1 sets - 10 reps - Seated Heel Raise  - 2-3 x daily - 7 x weekly - 2 sets - 10 reps - Seated Toe Raise   - 2-3 x daily - 7 x weekly - 2 sets - 10 reps  ASSESSMENT:  CLINICAL IMPRESSION: Pt demonstrated improved L ankle ROM.  Improved tolerance for backward gait today. Weight shifts with direction change was a good challenge to balance, but increased her pain by 1 point.  Descending stairs remain difficult and painful, both for L ankle and R knee.   Pt has met STG 4.      From initial impression:  Patient is a 68 y.o. f who was seen today for physical therapy evaluation and treatment for s/p left trimalleolar fx with repair. She is WBAT using a cane, sometimes a walker (if increase pain) and wearing and ankle brace when up during day.  She has moderate pain sensitivity with palpation throughout L lat malleolar area with particular discomfort about peroneal tendons.  She is limited in ankle ROM all planes limited > by pain than edema.  Edema minimal.  She will benefit from skilled PT to improve strength and ROM of left ankle improving gait , decreasing pain and returning pt to PLOF.  OBJECTIVE IMPAIRMENTS: Abnormal gait, decreased activity tolerance, decreased balance, difficulty walking, decreased ROM, decreased strength, obesity, and pain.   ACTIVITY LIMITATIONS: squatting, stairs, and locomotion level  PARTICIPATION LIMITATIONS: shopping and community activity  PERSONAL FACTORS: Past/current experiences and 1 comorbidity: back dysfunction  are also affecting patient's functional outcome.   REHAB POTENTIAL: Good  CLINICAL DECISION MAKING: Evolving/moderate complexity  EVALUATION COMPLEXITY: Moderate   GOALS: Goals reviewed with patient? Yes  SHORT TERM GOALS: Target date: 11/24/23 Pt will tolerate full aquatic sessions consistently without increase in pain and with improving function to demonstrate good toleration and effectiveness of intervention.  Baseline: Goal status: INITIAL  2.  Pt will navigate stair into and out of pool 6 steps using step through pattern Baseline: step-to  pattern  Goal status: In progress 11/10/23  3.  Pt will report reduction in pain by 3 NPRS post aquatic session which lasts for up to 24 hours. Baseline:  Goal status: INITIAL  4.  Pt will amb without antalgic gait pattern submerged in 3.6 ft Baseline:  Goal status:MET - 11/17/23   LONG TERM GOALS: Target date: 12/26/23  Pt to will improve foto score by at least 12 % to demonstrate improved perception of function. Baseline: 40% primary score Goal status: INITIAL  2.  Pt wiil navigate 1 flight of steps using step alternating pattern indep Baseline:  Goal status: INITIAL  3.  Pt will improve on left ankle ROM to 75% of normal Baseline: see chart Goal status: INITIAL  4.  Pt will improve on left ankle DF and PF strength by at least 1 grade to demonstrate improving functional ability Baseline: see chart Goal status: INITIAL  5.  Pt will amb without AD without limitation to pain Baseline:using cane or walker  Goal status: INITIAL    PLAN:  PT FREQUENCY: 1-2x/week  PT DURATION: 8 weeks  PLANNED INTERVENTIONS: 97164- PT Re-evaluation, 97110-Therapeutic exercises, 97530- Therapeutic activity, 97112- Neuromuscular re-education, 97535- Self Care, [id number]- Manual therapy, [phone]- Gait training, [phone]- Orthotic Fit/training, [phone]- Aquatic Therapy, Patient/Family education, Balance training, Stair training, Taping, Dry Needling, Joint  mobilization, DME instructions, Cryotherapy, and Moist heat  PLAN FOR NEXT SESSION: ankle ROM, strengthening.  Gait retraining, balance retraining  Delon Aquas, PTA 11/17/23 11:46 AM [facility] [address] [place], [place], [address] Phone: [phone]   Fax:  [phone]

## 2023-11-18 DIAGNOSIS — M47817 Spondylosis without myelopathy or radiculopathy, lumbosacral region: Secondary | ICD-10-CM | POA: Diagnosis not present

## 2023-11-18 DIAGNOSIS — F331 Major depressive disorder, recurrent, moderate: Secondary | ICD-10-CM | POA: Diagnosis not present

## 2023-11-19 ENCOUNTER — Encounter (HOSPITAL_BASED_OUTPATIENT_CLINIC_OR_DEPARTMENT_OTHER): Payer: Self-pay

## 2023-11-19 ENCOUNTER — Ambulatory Visit (HOSPITAL_BASED_OUTPATIENT_CLINIC_OR_DEPARTMENT_OTHER): Payer: Medicare HMO | Admitting: Physical Therapy

## 2023-11-20 DIAGNOSIS — M79645 Pain in left finger(s): Secondary | ICD-10-CM | POA: Diagnosis not present

## 2023-11-20 DIAGNOSIS — Z4789 Encounter for other orthopedic aftercare: Secondary | ICD-10-CM | POA: Diagnosis not present

## 2023-11-21 DIAGNOSIS — S82852D Displaced trimalleolar fracture of left lower leg, subsequent encounter for closed fracture with routine healing: Secondary | ICD-10-CM | POA: Diagnosis not present

## 2023-11-24 ENCOUNTER — Ambulatory Visit (HOSPITAL_BASED_OUTPATIENT_CLINIC_OR_DEPARTMENT_OTHER): Payer: Medicare HMO | Admitting: Physical Therapy

## 2023-11-24 ENCOUNTER — Encounter (HOSPITAL_BASED_OUTPATIENT_CLINIC_OR_DEPARTMENT_OTHER): Payer: Self-pay | Admitting: Physical Therapy

## 2023-11-24 DIAGNOSIS — M25572 Pain in left ankle and joints of left foot: Secondary | ICD-10-CM

## 2023-11-24 DIAGNOSIS — R262 Difficulty in walking, not elsewhere classified: Secondary | ICD-10-CM | POA: Diagnosis not present

## 2023-11-24 DIAGNOSIS — M25672 Stiffness of left ankle, not elsewhere classified: Secondary | ICD-10-CM | POA: Diagnosis not present

## 2023-11-24 DIAGNOSIS — S82852D Displaced trimalleolar fracture of left lower leg, subsequent encounter for closed fracture with routine healing: Secondary | ICD-10-CM

## 2023-11-24 NOTE — Therapy (Signed)
OUTPATIENT PHYSICAL THERAPY LOWER EXTREMITY TREATMENT   Patient Name: Haley White MRN: [id number] DOB:08-Nov-1955, 68 y.o., female Today's Date: 11/24/2023  END OF SESSION:  PT End of Session - 11/24/23 0949     Visit Number 7    Number of Visits 16    Date for PT Re-Evaluation 12/26/23    Authorization Type [employer] mcr    PT Start Time [phone]    PT Stop Time 1030    PT Time Calculation (min) 42 min    Activity Tolerance Patient tolerated treatment well    Behavior During Therapy WFL for tasks assessed/performed              Past Medical History:  Diagnosis Date   Anxiety    Arthritis    Asthma    Cataract    Depression    Gout    Hyperlipidemia    Hypertension    Obese    Past Surgical History:  Procedure Laterality Date   ANKLE SURGERY Left    APPENDECTOMY  1984   APPENDECTOMY     CATARACT EXTRACTION W/ INTRAOCULAR LENS  IMPLANT, BILATERAL  2014   CESAREAN SECTION  1984, 1987, 1994   X3   CESAREAN SECTION     x3   COLONOSCOPY  11/16/2013   w/Brodie    EXTERNAL FIXATION LEG Left 06/04/2023   Procedure: EXTERNAL FIXATION LEG;  Surgeon: Netta Cedars, MD;  Location: [facility];  Service: Orthopedics;  Laterality: Left;   EYE SURGERY Bilateral    lasix   FOOT FRACTURE SURGERY Left    FOOT SURGERY Left 2002   FRACTURE SURGERY Left    left ankle   KNEE SURGERY Left 1998   ORIF ANKLE FRACTURE Left 06/04/2023   Procedure: OPEN REDUCTION INTERNAL FIXATION (ORIF) ANKLE FRACTURE vs EX FIX;  Surgeon: Netta Cedars, MD;  Location: [facility];  Service: Orthopedics;  Laterality: Left;   ROTATOR CUFF REPAIR Right 2007   shoulder     torn rotator cuff   TUBAL LIGATION  1994   TUBAL LIGATION     Patient Active Problem List   Diagnosis Date Noted   OSA on CPAP 08/11/2023   Ankle fracture 06/03/2023   Trimalleolar fracture of left ankle 06/02/2023   Acute left ankle pain 06/02/2023   Sleep-related hypoxia 03/24/2023   At risk for obstructive sleep apnea  03/24/2023   Acute pulmonary embolism (HCC) 05/10/2022   Allergic rhinitis 03/15/2021   Gastro-esophageal reflux disease without esophagitis 03/15/2021   Mild persistent asthma, uncomplicated 03/15/2021   Aortic atherosclerosis (HCC) by Chest CT scan on 02/02/2018  01/10/2021   Asthma    Low back pain 03/17/2019   Lumbosacral spondylosis without myelopathy 03/17/2019   Pseudogout 12/09/2017   Major depressive disorder, recurrent, severe without psychotic features (HCC)    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   Suicide attempt by drug ingestion (HCC)    Vitamin D deficiency 08/22/2014   Hyperlipidemia, mixed 08/22/2014   Hypertension    Anxiety    Abnormal glucose    Class 2 severe obesity due to excess calories with serious comorbidity and body mass index (BMI) of 37.0 to 37.9 in adult [facility])     PCP: Lucky Cowboy MD  REFERRING PROVIDER: Netta Cedars, MD   REFERRING DIAG: S82.852D (ICD-10-CM) - Displaced trimalleolar fracture of left lower leg, subsequent encounter for closed fracture with routine healing   THERAPY DIAG:  Closed displaced trimalleolar fracture of left ankle with routine healing, subsequent encounter  Pain in  left ankle and joints of left foot  Stiffness of left ankle, not elsewhere classified  Rationale for Evaluation and Treatment: Rehabilitation  ONSET DATE: July 2024  SUBJECTIVE:   SUBJECTIVE STATEMENT: Pt having ankle hardware removal on 1/29.    PERTINENT HISTORY: The patient is s/p left ankle trimalleolar & syndesmosis ORIF, DOS: 06/05/2023  PAIN:  Are you having pain? Yes: NPRS scale: current 2-3/10 L post lat ankle peroneal area;  Pain location: see above Pain description: sharpe ache Aggravating factors: being up on it; standing/walk 45 mins. Relieving factors: resting elevating, icing  PRECAUTIONS: None wearing ankle brace from [facility] during day.  RED FLAGS: None   WEIGHT BEARING RESTRICTIONS: No  FALLS:  Has  patient fallen in last 6 months? Yes. Number of falls 1 fell down wet hill walking dog  LIVING ENVIRONMENT: Lives with: lives alone Lives in: House/apartment Stairs: Yes: External: 3 steps; none Has following equipment at home: Single point cane   OCCUPATION: retired   PLOF: Independent  PATIENT GOALS: walk normally and as pain free as able  NEXT MD VISIT: Nov 12, 2022  OBJECTIVE:  Note: Objective measures were completed at Evaluation unless otherwise noted.  DIAGNOSTIC FINDINGS: IMPRESSION: 1. Acute trimalleolar fracture-subluxation as described above. 2. Additional small acute avulsion fracture of the anterolateral tibial plafond.   MRI cervical spine (05/28/2023): Severe left C5-6 neuroforaminal narrowing impinging on exiting left C6 nerve roots. Mild to moderate right and mild left C6-7 neuroforaminal narrowing. C7-T1 with moderate right and mild left facet arthrosis without narrowing.    MRI lumbar spine (11/01/2022): Mild levoconvex scoliosis. Chronic mild anterior wedge compression fracture of T12, 22% anterior body height loss and chronic anterior wedge compression deformity of L1 with 36% anterior body height loss. Moderate left L4-5 neuroforaminal narrowing with contact and mild dorsal lateral displacement of exiting left L4 nerve roots. Mild right L3-4 neuroforaminal narrowing. Mild multilevel retrolisthesis.     Assessment/Plan  Patient returns for follow-up and review of EMG/nerve conduction study. It demonstrates mild carpal tunnel syndrome but no evidence of cervical radiculopathy. Given EMG findings and no improvement of her symptoms with cervical epidural steroid injection, I suspect that her pain does not originate from her cervical spine. Physical exam is notable for marked tenderness over the lateral epicondyle and pain with resisted wrist extension; I suspect that she may have a degree of lateral epicondylitis. Would recommend return to Dr. Melvyn Novas for evaluation  of this pain as well as mild carpal tunnel syndrome. She does have a history of fibromyalgia which I believe complicates her pain presentation. Will also start her on Lyrica 25 mg twice daily. May uptitrate to 50 mg twice daily if no improvement. Follow-up for medication titration in 6 to 8 weeks.    PATIENT SURVEYS:  FOTO Primary score 40%; risk adjusted 46% with goal of 59%  COGNITION: Overall cognitive status: Within functional limits for tasks assessed     SENSATION: WFL  EDEMA:  Mod to minimal post left lat malleolus     PALPATION: Moderate TTP throughout left lat malleolus, peroneals and distal acchilles Minimal TTP medial malleolus at point of hardware  LOWER EXTREMITY ROM:  Active ROM Right eval Left eval Left  11/17/23  Hip flexion     Hip extension     Hip abduction     Hip adduction     Hip internal rotation     Hip external rotation     Knee flexion     Knee extension  Ankle dorsiflexion  -8 Lacking 2  Ankle plantarflexion  30d 42  Ankle inversion  Limited by 50%   Ankle eversion  Limited by 50%    (Blank rows = not tested)  LOWER EXTREMITY MMT:  MMT Right eval Left eval Left  Hip flexion     Hip extension     Hip abduction     Hip adduction     Hip internal rotation     Hip external rotation     Knee flexion     Knee extension     Ankle dorsiflexion 5 2+   Ankle plantarflexion 5 3-   Ankle inversion 5 3-   Ankle eversion 5 3-    (Blank rows = not tested)  GAIT: Distance walked: 500 ft Assistive device utilized: Single point cane Level of assistance: Complete Independence Comments: antalgic limp offset right                                                                                                                                TREATMENT DATE:  Pt seen for aquatic therapy today.  Treatment took place in water 3.5-4.75 ft in depth at the [facility] pool. Temp of water was 91.  Pt entered/exited the pool via stairs  independently in step-to pattern with bilat rail.  (Facing hot tub during session)  * unsupported in 75ft6" multiple laps forward/ backward with cues for heel/toe and toe/ heel. Pt reports backward  amb has improved no ankle discomfort *stair negotiation  * side stepping R/L with/without rainbow hand floats  * L stretch at wall * Reciprocal arm swing with light resistance  bells wide stance then staggered 10 slow then 10 fast 2 sets in each position *SLS left 3 x 20s in 3.8 ft then 3.6 * straddling yellow noodle:  NO increase in foot pain   Pt requires the buoyancy and hydrostatic pressure of water for support, and to offload joints by unweighting joint load by at least 50 % in navel deep water and by at least 75-80% in chest to neck deep water.  Viscosity of the water is needed for resistance of strengthening. Water current perturbations provides challenge to standing balance requiring increased core activation.  PATIENT EDUCATION:  Education details: reacquainting to aquatic therapy Person educated: Patient Education method: Explanation Education comprehension: verbalized understanding  HOME EXERCISE PROGRAM: Access Code: [email or web address] URL: [email or web address] Date: 10/28/2023 Prepared by: Geni Bers  Exercises - Seated Ankle Alphabet  - 3 x daily - 7 x weekly - 1 sets - 10 reps - Seated Heel Raise  - 2-3 x daily - 7 x weekly - 2 sets - 10 reps - Seated Toe Raise  - 2-3 x daily - 7 x weekly - 2 sets - 10 reps  ASSESSMENT:  CLINICAL IMPRESSION: Pt demonstrating good ankle strength and proprioception with SLS  on left. Fair toleration to decrease submersion, able to complete but does increase soreness.  She continues to have difficulty with descending stairs due to limited ankle ROM which is not expected at this point to improve until surgery.  She is able to ascend step using alternating pattern. All other STG met. Pt scheduled for hardware removal next week.  She will  message MD to see if he will be wanting her to continue with therapy land based.  She will not be seen in aquatics for at least 4-6 weeks post surgery.  Not certain aquatics will be indicated afterward.      From initial impression:  Patient is a 68 y.o. f who was seen today for physical therapy evaluation and treatment for s/p left trimalleolar fx with repair. She is WBAT using a cane, sometimes a walker (if increase pain) and wearing and ankle brace when up during day.  She has moderate pain sensitivity with palpation throughout L lat malleolar area with particular discomfort about peroneal tendons.  She is limited in ankle ROM all planes limited > by pain than edema.  Edema minimal.  She will benefit from skilled PT to improve strength and ROM of left ankle improving gait , decreasing pain and returning pt to PLOF.  OBJECTIVE IMPAIRMENTS: Abnormal gait, decreased activity tolerance, decreased balance, difficulty walking, decreased ROM, decreased strength, obesity, and pain.   ACTIVITY LIMITATIONS: squatting, stairs, and locomotion level  PARTICIPATION LIMITATIONS: shopping and community activity  PERSONAL FACTORS: Past/current experiences and 1 comorbidity: back dysfunction  are also affecting patient's functional outcome.   REHAB POTENTIAL: Good  CLINICAL DECISION MAKING: Evolving/moderate complexity  EVALUATION COMPLEXITY: Moderate   GOALS: Goals reviewed with patient? Yes  SHORT TERM GOALS: Target date: 11/24/23 Pt will tolerate full aquatic sessions consistently without increase in pain and with improving function to demonstrate good toleration and effectiveness of intervention.  Baseline: Goal status: Met 11/24/23  2.  Pt will navigate stair into and out of pool 6 steps using step through pattern Baseline: step-to pattern Goal status: In progress 11/10/23. Partially met 11/24/23  3.  Pt will report reduction in pain by 3 NPRS post aquatic session which lasts for up to 24  hours. Baseline:  Goal status: Met 11/24/23  4.  Pt will amb without antalgic gait pattern submerged in 3.6 ft Baseline:  Goal status:MET - 11/17/23   LONG TERM GOALS: Target date: 12/26/23  Pt to will improve foto score by at least 12 % to demonstrate improved perception of function. Baseline: 40% primary score Goal status: INITIAL  2.  Pt wiil navigate 1 flight of steps using step alternating pattern indep Baseline:  Goal status: INITIAL  3.  Pt will improve on left ankle ROM to 75% of normal Baseline: see chart Goal status: INITIAL  4.  Pt will improve on left ankle DF and PF strength by at least 1 grade to demonstrate improving functional ability Baseline: see chart Goal status: INITIAL  5.  Pt will amb without AD without limitation to pain Baseline:using cane or walker  Goal status: INITIAL    PLAN:  PT FREQUENCY: 1-2x/week  PT DURATION: 8 weeks  PLANNED INTERVENTIONS: 97164- PT Re-evaluation, 97110-Therapeutic exercises, 97530- Therapeutic activity, 97112- Neuromuscular re-education, 97535- Self Care, [id number]- Manual therapy, [phone]- Gait training, [phone]- Orthotic Fit/training, [phone]- Aquatic Therapy, Patient/Family education, Balance training, Stair training, Taping, Dry Needling, Joint mobilization, DME instructions, Cryotherapy, and Moist heat  PLAN FOR NEXT SESSION: ankle ROM, strengthening.  Gait retraining, balance retraining  Dyllan Shamis) Kaydince Towles MPT 11/24/23 10:37 AM [facility]  [address] [place], [place], [address] Phone: [phone]   Fax:  [phone]

## 2023-11-27 ENCOUNTER — Ambulatory Visit (HOSPITAL_BASED_OUTPATIENT_CLINIC_OR_DEPARTMENT_OTHER): Payer: Medicare HMO | Admitting: Physical Therapy

## 2023-11-27 ENCOUNTER — Encounter (HOSPITAL_BASED_OUTPATIENT_CLINIC_OR_DEPARTMENT_OTHER): Payer: Self-pay | Admitting: Physical Therapy

## 2023-11-27 ENCOUNTER — Encounter (HOSPITAL_BASED_OUTPATIENT_CLINIC_OR_DEPARTMENT_OTHER): Payer: Self-pay | Admitting: Orthopaedic Surgery

## 2023-11-27 DIAGNOSIS — R262 Difficulty in walking, not elsewhere classified: Secondary | ICD-10-CM | POA: Diagnosis not present

## 2023-11-27 DIAGNOSIS — S82852D Displaced trimalleolar fracture of left lower leg, subsequent encounter for closed fracture with routine healing: Secondary | ICD-10-CM | POA: Diagnosis not present

## 2023-11-27 DIAGNOSIS — M25572 Pain in left ankle and joints of left foot: Secondary | ICD-10-CM | POA: Diagnosis not present

## 2023-11-27 DIAGNOSIS — M25672 Stiffness of left ankle, not elsewhere classified: Secondary | ICD-10-CM | POA: Diagnosis not present

## 2023-11-27 NOTE — Therapy (Signed)
OUTPATIENT PHYSICAL THERAPY LOWER EXTREMITY TREATMENT   Patient Name: Haley White MRN: [id number] DOB:Dec 14, 1955, 68 y.o., female Today's Date: 11/27/2023  END OF SESSION:  PT End of Session - 11/27/23 0915     Visit Number 8    Number of Visits 16    Date for PT Re-Evaluation 12/26/23    Authorization Type aetna mcr    PT Start Time 0900    PT Stop Time 0940    PT Time Calculation (min) 40 min    Activity Tolerance Patient tolerated treatment well    Behavior During Therapy WFL for tasks assessed/performed              Past Medical History:  Diagnosis Date   Anxiety    Arthritis    Asthma    Cataract    Depression    Gout    Hyperlipidemia    Hypertension    Obese    Past Surgical History:  Procedure Laterality Date   ANKLE SURGERY Left    APPENDECTOMY  1984   APPENDECTOMY     CATARACT EXTRACTION W/ INTRAOCULAR LENS  IMPLANT, BILATERAL  2014   CESAREAN SECTION  1984, 1987, 1994   X3   CESAREAN SECTION     x3   COLONOSCOPY  11/16/2013   w/Brodie    EXTERNAL FIXATION LEG Left 06/04/2023   Procedure: EXTERNAL FIXATION LEG;  Surgeon: Netta Cedars, MD;  Location: [facility];  Service: Orthopedics;  Laterality: Left;   EYE SURGERY Bilateral    lasix   FOOT FRACTURE SURGERY Left    FOOT SURGERY Left 2002   FRACTURE SURGERY Left    left ankle   KNEE SURGERY Left 1998   ORIF ANKLE FRACTURE Left 06/04/2023   Procedure: OPEN REDUCTION INTERNAL FIXATION (ORIF) ANKLE FRACTURE vs EX FIX;  Surgeon: Netta Cedars, MD;  Location: [facility];  Service: Orthopedics;  Laterality: Left;   ROTATOR CUFF REPAIR Right 2007   shoulder     torn rotator cuff   TUBAL LIGATION  1994   TUBAL LIGATION     Patient Active Problem List   Diagnosis Date Noted   OSA on CPAP 08/11/2023   Ankle fracture 06/03/2023   Trimalleolar fracture of left ankle 06/02/2023   Acute left ankle pain 06/02/2023   Sleep-related hypoxia 03/24/2023   At risk for obstructive sleep apnea  03/24/2023   Acute pulmonary embolism (HCC) 05/10/2022   Allergic rhinitis 03/15/2021   Gastro-esophageal reflux disease without esophagitis 03/15/2021   Mild persistent asthma, uncomplicated 03/15/2021   Aortic atherosclerosis (HCC) by Chest CT scan on 02/02/2018  01/10/2021   Asthma    Low back pain 03/17/2019   Lumbosacral spondylosis without myelopathy 03/17/2019   Pseudogout 12/09/2017   Major depressive disorder, recurrent, severe without psychotic features (HCC)    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   Suicide attempt by drug ingestion (HCC)    Vitamin D deficiency 08/22/2014   Hyperlipidemia, mixed 08/22/2014   Hypertension    Anxiety    Abnormal glucose    Class 2 severe obesity due to excess calories with serious comorbidity and body mass index (BMI) of 37.0 to 37.9 in adult [facility])     PCP: Lucky Cowboy MD  REFERRING PROVIDER: Netta Cedars, MD   REFERRING DIAG: S82.852D (ICD-10-CM) - Displaced trimalleolar fracture of left lower leg, subsequent encounter for closed fracture with routine healing   THERAPY DIAG:  Closed displaced trimalleolar fracture of left ankle with routine healing, subsequent encounter  Pain in  left ankle and joints of left foot  Stiffness of left ankle, not elsewhere classified  Difficulty in walking, not elsewhere classified  Rationale for Evaluation and Treatment: Rehabilitation  ONSET DATE: July 2024  SUBJECTIVE:   SUBJECTIVE STATEMENT: " I'm having more pain with the colder weather".   Pt states she was told she will return to land PT and can get back to aquatics after 6 wks (s/p hardware removal).  Pt having ankle hardware removal on 1/29.    PERTINENT HISTORY: The patient is s/p left ankle trimalleolar & syndesmosis ORIF, DOS: 06/05/2023  PAIN:  Are you having pain? Yes: NPRS scale: current 4/10 L post lat ankle peroneal area;  Pain location: see above Pain description: sharpe ache Aggravating factors: being up on it;  standing/walk 45 mins. Relieving factors: resting elevating, icing  PRECAUTIONS: None wearing ankle brace from [facility] during day.  RED FLAGS: None   WEIGHT BEARING RESTRICTIONS: No  FALLS:  Has patient fallen in last 6 months? Yes. Number of falls 1 fell down wet hill walking dog  LIVING ENVIRONMENT: Lives with: lives alone Lives in: House/apartment Stairs: Yes: External: 3 steps; none Has following equipment at home: Single point cane   OCCUPATION: retired   PLOF: Independent  PATIENT GOALS: walk normally and as pain free as able  NEXT MD VISIT: Nov 12, 2022  OBJECTIVE:  Note: Objective measures were completed at Evaluation unless otherwise noted.  DIAGNOSTIC FINDINGS: IMPRESSION: 1. Acute trimalleolar fracture-subluxation as described above. 2. Additional small acute avulsion fracture of the anterolateral tibial plafond.   MRI cervical spine (05/28/2023): Severe left C5-6 neuroforaminal narrowing impinging on exiting left C6 nerve roots. Mild to moderate right and mild left C6-7 neuroforaminal narrowing. C7-T1 with moderate right and mild left facet arthrosis without narrowing.    MRI lumbar spine (11/01/2022): Mild levoconvex scoliosis. Chronic mild anterior wedge compression fracture of T12, 22% anterior body height loss and chronic anterior wedge compression deformity of L1 with 36% anterior body height loss. Moderate left L4-5 neuroforaminal narrowing with contact and mild dorsal lateral displacement of exiting left L4 nerve roots. Mild right L3-4 neuroforaminal narrowing. Mild multilevel retrolisthesis.     Assessment/Plan  Patient returns for follow-up and review of EMG/nerve conduction study. It demonstrates mild carpal tunnel syndrome but no evidence of cervical radiculopathy. Given EMG findings and no improvement of her symptoms with cervical epidural steroid injection, I suspect that her pain does not originate from her cervical spine. Physical exam is  notable for marked tenderness over the lateral epicondyle and pain with resisted wrist extension; I suspect that she may have a degree of lateral epicondylitis. Would recommend return to Dr. Melvyn Novas for evaluation of this pain as well as mild carpal tunnel syndrome. She does have a history of fibromyalgia which I believe complicates her pain presentation. Will also start her on Lyrica 25 mg twice daily. May uptitrate to 50 mg twice daily if no improvement. Follow-up for medication titration in 6 to 8 weeks.    PATIENT SURVEYS:  FOTO Primary score 40%; risk adjusted 46% with goal of 59%  COGNITION: Overall cognitive status: Within functional limits for tasks assessed     SENSATION: WFL  EDEMA:  Mod to minimal post left lat malleolus     PALPATION: Moderate TTP throughout left lat malleolus, peroneals and distal acchilles Minimal TTP medial malleolus at point of hardware  LOWER EXTREMITY ROM:  Active ROM Right eval Left eval Left  11/17/23  Hip flexion  Hip extension     Hip abduction     Hip adduction     Hip internal rotation     Hip external rotation     Knee flexion     Knee extension     Ankle dorsiflexion  -8 Lacking 2  Ankle plantarflexion  30d 42  Ankle inversion  Limited by 50%   Ankle eversion  Limited by 50%    (Blank rows = not tested)  LOWER EXTREMITY MMT:  MMT Right eval Left eval Left  Hip flexion     Hip extension     Hip abduction     Hip adduction     Hip internal rotation     Hip external rotation     Knee flexion     Knee extension     Ankle dorsiflexion 5 2+   Ankle plantarflexion 5 3-   Ankle inversion 5 3-   Ankle eversion 5 3-    (Blank rows = not tested)  GAIT: Distance walked: 500 ft Assistive device utilized: Single point cane Level of assistance: Complete Independence Comments: antalgic limp offset right                                                                                                                                 TREATMENT DATE:  Pt seen for aquatic therapy today.  Treatment took place in water 3.5-4.75 ft in depth at the [facility] pool. Temp of water was 91.  Pt entered/exited the pool via stairs independently in step-to pattern with bilat rail.  (Facing hot tub during session)  * straddling noodle, without UE support- cycling multiple widths, hip abdct/ addct, suspended jumping jack LEs * walking multiple laps forward/ backward with cues for heel/toe and toe/ heel * side stepping -> with arm addct/ abdct with rainbow hand floats * forward walking kicks  * high knee marching with rainbow hand floats under water at sides  * UE on wall: L stretch; heel/toe raises 2 x 10; hip abdct/ addct 2 x 10; hip ext  2 x 10; L stretch x 20s * Reciprocal arm swing with light resistance  bells wide stance then staggered 10 slow then 10 fast 2 sets in each position *SLS left 3 x 20s in 4 ft  Pt requires the buoyancy and hydrostatic pressure of water for support, and to offload joints by unweighting joint load by at least 50 % in navel deep water and by at least 75-80% in chest to neck deep water.  Viscosity of the water is needed for resistance of strengthening. Water current perturbations provides challenge to standing balance requiring increased core activation.  PATIENT EDUCATION:  Education details: reacquainting to aquatic therapy Person educated: Patient Education method: Explanation Education comprehension: verbalized understanding  HOME EXERCISE PROGRAM: Access Code: [email or web address] URL: [email or web address] Date: 10/28/2023 Prepared by: Geni Bers  Exercises - Seated Ankle Alphabet  - 3 x daily - 7 x  weekly - 1 sets - 10 reps - Seated Heel Raise  - 2-3 x daily - 7 x weekly - 2 sets - 10 reps - Seated Toe Raise  - 2-3 x daily - 7 x weekly - 2 sets - 10 reps  ASSESSMENT:  CLINICAL IMPRESSION: Pt scheduled for hardware removal next week.  Will await a referral to  continue with therapy land based after surgery. Improved tolerance for gait after some time spent cycling/suspended in deeper water. Pain in ankle reduced to 2/10 during session.  No increase in back pain; pt had ablation performed 2 wks ago with success.  Plan to discuss aquatic exercises to perform once she is able to return to water post surgery (issue updated HEP ?)     From initial impression:  Patient is a 68 y.o. f who was seen today for physical therapy evaluation and treatment for s/p left trimalleolar fx with repair. She is WBAT using a cane, sometimes a walker (if increase pain) and wearing and ankle brace when up during day.  She has moderate pain sensitivity with palpation throughout L lat malleolar area with particular discomfort about peroneal tendons.  She is limited in ankle ROM all planes limited > by pain than edema.  Edema minimal.  She will benefit from skilled PT to improve strength and ROM of left ankle improving gait , decreasing pain and returning pt to PLOF.  OBJECTIVE IMPAIRMENTS: Abnormal gait, decreased activity tolerance, decreased balance, difficulty walking, decreased ROM, decreased strength, obesity, and pain.   ACTIVITY LIMITATIONS: squatting, stairs, and locomotion level  PARTICIPATION LIMITATIONS: shopping and community activity  PERSONAL FACTORS: Past/current experiences and 1 comorbidity: back dysfunction  are also affecting patient's functional outcome.   REHAB POTENTIAL: Good  CLINICAL DECISION MAKING: Evolving/moderate complexity  EVALUATION COMPLEXITY: Moderate   GOALS: Goals reviewed with patient? Yes  SHORT TERM GOALS: Target date: 11/24/23 Pt will tolerate full aquatic sessions consistently without increase in pain and with improving function to demonstrate good toleration and effectiveness of intervention.  Baseline: Goal status: Met 11/24/23  2.  Pt will navigate stair into and out of pool 6 steps using step through pattern Baseline: step-to  pattern Goal status: In progress 11/10/23. Partially met 11/24/23  3.  Pt will report reduction in pain by 3 NPRS post aquatic session which lasts for up to 24 hours. Baseline:  Goal status: Met 11/24/23  4.  Pt will amb without antalgic gait pattern submerged in 3.6 ft Baseline:  Goal status:MET - 11/17/23   LONG TERM GOALS: Target date: 12/26/23  Pt to will improve foto score by at least 12 % to demonstrate improved perception of function. Baseline: 40% primary score Goal status: INITIAL  2.  Pt wiil navigate 1 flight of steps using step alternating pattern indep Baseline:  Goal status: INITIAL  3.  Pt will improve on left ankle ROM to 75% of normal Baseline: see chart Goal status: INITIAL  4.  Pt will improve on left ankle DF and PF strength by at least 1 grade to demonstrate improving functional ability Baseline: see chart Goal status: INITIAL  5.  Pt will amb without AD without limitation to pain Baseline:using cane or walker  Goal status: INITIAL    PLAN:  PT FREQUENCY: 1-2x/week  PT DURATION: 8 weeks  PLANNED INTERVENTIONS: 97164- PT Re-evaluation, 97110-Therapeutic exercises, 97530- Therapeutic activity, 97112- Neuromuscular re-education, 97535- Self Care, 40981- Manual therapy, [phone]- Gait training, [phone]- Orthotic Fit/training, [phone]- Aquatic Therapy, Patient/Family education, Balance training, Stair  training, Taping, Dry Needling, Joint mobilization, DME instructions, Cryotherapy, and Moist heat  PLAN FOR NEXT SESSION: ankle ROM, strengthening.  Gait retraining, balance retraining   Mayer Camel, [place] 11/27/23 2:11 PM [facility] [address] [place], [place], [address] Phone: [phone]   Fax:  [phone]

## 2023-11-30 NOTE — H&P (Signed)
ORTHOPAEDIC SURGERY H&P  Subjective:  The patient presents with left ankle hardware.   Past Medical History:  Diagnosis Date   Anxiety    Arthritis    Asthma    Cataract    Depression    Gout    Hyperlipidemia    Hypertension    Obese    Sleep apnea     Past Surgical History:  Procedure Laterality Date   ANKLE SURGERY Left    APPENDECTOMY  1984   APPENDECTOMY     CATARACT EXTRACTION W/ INTRAOCULAR LENS  IMPLANT, BILATERAL  2014   CESAREAN SECTION  1984, 1987, 1994   X3   CESAREAN SECTION     x3   COLONOSCOPY  11/16/2013   w/Brodie    EXTERNAL FIXATION LEG Left 06/04/2023   Procedure: EXTERNAL FIXATION LEG;  Surgeon: Netta Cedars, MD;  Location: [facility];  Service: Orthopedics;  Laterality: Left;   EYE SURGERY Bilateral    lasix   FOOT FRACTURE SURGERY Left    FOOT SURGERY Left 2002   FRACTURE SURGERY Left    left ankle   KNEE SURGERY Left 1998   ORIF ANKLE FRACTURE Left 06/04/2023   Procedure: OPEN REDUCTION INTERNAL FIXATION (ORIF) ANKLE FRACTURE vs EX FIX;  Surgeon: Netta Cedars, MD;  Location: [facility];  Service: Orthopedics;  Laterality: Left;   ROTATOR CUFF REPAIR Right 2007   shoulder     torn rotator cuff   TUBAL LIGATION  1994   TUBAL LIGATION       (Not in an outpatient encounter)    Allergies  Allergen Reactions   Molds & Smuts Anaphylaxis   Biaxin [Clarithromycin]     GI upset   Biaxin [Clarithromycin] Other (See Comments)    GI Upset   Prednisone     Was admitted for self harm when on prednisone before   Prednisone Other (See Comments)    Suicidal   Serevent [Salmeterol]     Palpitations   Tequin [Gatifloxacin]     GI upset. thrush   Tequin [Gatifloxacin] Other (See Comments)    GI upset, Thrush   Serevent [Salmeterol] Palpitations    Social History   Socioeconomic History   Marital status: Divorced    Spouse name: Not on file   Number of children: Not on file   Years of education: Not on file   Highest education  level: Not on file  Occupational History   Not on file  Tobacco Use   Smoking status: Never   Smokeless tobacco: Never  Vaping Use   Vaping status: Never Used  Substance and Sexual Activity   Alcohol use: Yes    Comment: occasionally   Drug use: Never    Comment: 06/03/2022-gummies   Sexual activity: Yes    Birth control/protection: Post-menopausal, Surgical  Other Topics Concern   Not on file  Social History Narrative   ** Merged History Encounter **       Social Drivers of Health   Financial Resource Strain: Not on file  Food Insecurity: Patient Declined (06/03/2023)   Hunger Vital Sign    Worried About Running Out of Food in the Last Year: Patient declined    Ran Out of Food in the Last Year: Patient declined  Transportation Needs: No Transportation Needs (06/02/2023)   PRAPARE - Administrator, Civil Service (Medical): No    Lack of Transportation (Non-Medical): No  Physical Activity: Not on file  Stress: Not on file  Social Connections:  Not on file  Intimate Partner Violence: Not At Risk (06/03/2023)   Humiliation, Afraid, Rape, and Kick questionnaire    Fear of Current or Ex-Partner: No    Emotionally Abused: No    Physically Abused: No    Sexually Abused: No     History reviewed. No pertinent family history.   Review of Systems Pertinent items are noted in HPI.  Objective: Vital signs in last 24 hours:    11/27/2023   11:38 AM 11/13/2023   11:08 AM 10/24/2023   10:38 AM  Vitals with BMI  Height 5\' 3"  5\' 3"  5\' 2"   Weight 227 lbs 1 oz 229 lbs 224 lbs  BMI 40.23 40.58 40.96  Systolic  135 130  Diastolic  83 74  Pulse  89 89      EXAM: General: Well nourished, well developed. Awake, alert and oriented to time, place, person. Normal mood and affect. No apparent distress. Breathing room air.  Operative Lower Extremity: Alignment - Neutral Deformity - None Skin intact Tenderness to palpation - none 5/5 TA, PT, GS, Per, EHL,  FHL Sensation intact to light touch throughout Palpable DP and PT pulses Special testing: None  The contralateral foot/ankle was examined for comparison and noted to be neurovascularly intact with no localized deformity, swelling, or tenderness.  Imaging Review All images taken were independently reviewed by me.  Assessment/Plan: The clinical and radiographic findings were reviewed and discussed at length with the patient.  The patient has left ankle hardware.  We spoke at length about the natural course of these findings. We discussed nonoperative and operative treatment options in detail.  The risks and benefits were presented and reviewed. The risks due to inability to remove part/all of hardware, recurrent instability, hardware failure/irritation, new/persistent/recurrent infection, stiffness, nerve/vessel/tendon injury, nonunion/malunion of any fracture, wound healing issues, allograft usage, development of arthritis, failure of this surgery, possibility of external fixation in certain situations, possibility of delayed definitive surgery, need for further surgery, prolonged wound care including further soft tissue coverage procedures, thromboembolic events, anesthesia/medical complications/events perioperatively and beyond, amputation, death among others were discussed. The patient acknowledged the explanation and agreed to proceed with the plan.  Netta Cedars  Orthopaedic Surgery [facility]

## 2023-11-30 NOTE — Discharge Instructions (Signed)
Netta Cedars, MD [facility]  Please read the following information regarding your care after surgery.  Medications  You only need a prescription for the narcotic pain medicine (ex. oxycodone, Percocet, Norco).  All of the other medicines listed below are available over the counter. ? Aleve 2 pills twice a day for the first 3 days after surgery. ? acetominophen (Tylenol) 650 mg every 4-6 hours as you need for minor to moderate pain ? oxycodone as prescribed for severe pain  ? To help prevent blood clots, take aspirin (81 mg) twice daily for 28 days after surgery.  You should also get up every hour while you are awake to move around.  Weight Bearing ? OK to walk on the operative leg only AFTER the nerve block has completely worn off.  Cast / Splint / Dressing ? Keep your dressing clean and dry.  Don't put anything (coat hanger, pencil, etc) down inside of it.  If it gets wet, please notify the office immediately.  Swelling IMPORTANT: It is normal for you to have swelling where you had surgery. To reduce swelling and pain, keep at least 3 pillows under your leg so that your toes are above your nose and your heel is above the level of your hip.  It may be necessary to keep your foot or leg elevated for several weeks.  This is critical to helping your incisions heal and your pain to feel better.  Follow Up Call my office at [phone] when you are discharged from the hospital or surgery center to schedule an appointment to be seen within 7-10 days after surgery.  Call my office at [phone] if you develop a fever >101.5 F, nausea, vomiting, bleeding from the surgical site or severe pain.   Post Anesthesia Home Care Instructions  Activity: Get plenty of rest for the remainder of the day. A responsible individual must stay with you for 24 hours following the procedure.  For the next 24 hours, DO NOT: -Drive a car -Advertising copywriter -Drink alcoholic beverages -Take any  medication unless instructed by your physician -Make any legal decisions or sign important papers.  Meals: Start with liquid foods such as gelatin or soup. Progress to regular foods as tolerated. Avoid greasy, spicy, heavy foods. If nausea and/or vomiting occur, drink only clear liquids until the nausea and/or vomiting subsides. Call your physician if vomiting continues.  Special Instructions/Symptoms: Your throat may feel dry or sore from the anesthesia or the breathing tube placed in your throat during surgery. If this causes discomfort, gargle with warm salt water. The discomfort should disappear within 24 hours.  If you had a scopolamine patch placed behind your ear for the management of post- operative nausea and/or vomiting:  1. The medication in the patch is effective for 72 hours, after which it should be removed.  Wrap patch in a tissue and discard in the trash. Wash hands thoroughly with soap and water. 2. You may remove the patch earlier than 72 hours if you experience unpleasant side effects which may include dry mouth, dizziness or visual disturbances. 3. Avoid touching the patch. Wash your hands with soap and water after contact with the patch.    Post Anesthesia Home Care Instructions  Activity: Get plenty of rest for the remainder of the day. A responsible individual must stay with you for 24 hours following the procedure.  For the next 24 hours, DO NOT: -Drive a car -Advertising copywriter -Drink alcoholic beverages -Take any medication unless instructed by your  physician -Make any legal decisions or sign important papers.  Meals: Start with liquid foods such as gelatin or soup. Progress to regular foods as tolerated. Avoid greasy, spicy, heavy foods. If nausea and/or vomiting occur, drink only clear liquids until the nausea and/or vomiting subsides. Call your physician if vomiting continues.  Special Instructions/Symptoms: Your throat may feel dry or sore from the  anesthesia or the breathing tube placed in your throat during surgery. If this causes discomfort, gargle with warm salt water. The discomfort should disappear within 24 hours.  If you had a scopolamine patch placed behind your ear for the management of post- operative nausea and/or vomiting:  1. The medication in the patch is effective for 72 hours, after which it should be removed.  Wrap patch in a tissue and discard in the trash. Wash hands thoroughly with soap and water. 2. You may remove the patch earlier than 72 hours if you experience unpleasant side effects which may include dry mouth, dizziness or visual disturbances. 3. Avoid touching the patch. Wash your hands with soap and water after contact with the patch.    Post Anesthesia Home Care Instructions  Activity: Get plenty of rest for the remainder of the day. A responsible individual must stay with you for 24 hours following the procedure.  For the next 24 hours, DO NOT: -Drive a car -Advertising copywriter -Drink alcoholic beverages -Take any medication unless instructed by your physician -Make any legal decisions or sign important papers.  Meals: Start with liquid foods such as gelatin or soup. Progress to regular foods as tolerated. Avoid greasy, spicy, heavy foods. If nausea and/or vomiting occur, drink only clear liquids until the nausea and/or vomiting subsides. Call your physician if vomiting continues.  Special Instructions/Symptoms: Your throat may feel dry or sore from the anesthesia or the breathing tube placed in your throat during surgery. If this causes discomfort, gargle with warm salt water. The discomfort should disappear within 24 hours.  If you had a scopolamine patch placed behind your ear for the management of post- operative nausea and/or vomiting:  1. The medication in the patch is effective for 72 hours, after which it should be removed.  Wrap patch in a tissue and discard in the trash. Wash hands thoroughly  with soap and water. 2. You may remove the patch earlier than 72 hours if you experience unpleasant side effects which may include dry mouth, dizziness or visual disturbances. 3. Avoid touching the patch. Wash your hands with soap and water after contact with the patch.     *You had 5mg  of Oxycodone at 3:30pm today.

## 2023-12-01 ENCOUNTER — Ambulatory Visit (HOSPITAL_BASED_OUTPATIENT_CLINIC_OR_DEPARTMENT_OTHER): Payer: Medicare HMO | Admitting: Physical Therapy

## 2023-12-02 DIAGNOSIS — F331 Major depressive disorder, recurrent, moderate: Secondary | ICD-10-CM | POA: Diagnosis not present

## 2023-12-03 ENCOUNTER — Ambulatory Visit (HOSPITAL_BASED_OUTPATIENT_CLINIC_OR_DEPARTMENT_OTHER): Payer: Medicare HMO | Admitting: Anesthesiology

## 2023-12-03 ENCOUNTER — Ambulatory Visit (HOSPITAL_BASED_OUTPATIENT_CLINIC_OR_DEPARTMENT_OTHER)
Admission: RE | Admit: 2023-12-03 | Discharge: 2023-12-03 | Disposition: A | Discharge status: Home / Self Care | Payer: Medicare HMO | Attending: Orthopaedic Surgery | Admitting: Orthopaedic Surgery

## 2023-12-03 ENCOUNTER — Ambulatory Visit (HOSPITAL_BASED_OUTPATIENT_CLINIC_OR_DEPARTMENT_OTHER): Payer: Medicare HMO

## 2023-12-03 ENCOUNTER — Encounter (HOSPITAL_BASED_OUTPATIENT_CLINIC_OR_DEPARTMENT_OTHER)
Admission: RE | Disposition: A | Discharge status: Home / Self Care | Payer: Self-pay | Source: Home / Self Care | Attending: Orthopaedic Surgery

## 2023-12-03 ENCOUNTER — Ambulatory Visit (HOSPITAL_BASED_OUTPATIENT_CLINIC_OR_DEPARTMENT_OTHER): Payer: Self-pay | Admitting: Physical Therapy

## 2023-12-03 ENCOUNTER — Encounter (HOSPITAL_BASED_OUTPATIENT_CLINIC_OR_DEPARTMENT_OTHER): Payer: Self-pay | Admitting: Orthopaedic Surgery

## 2023-12-03 ENCOUNTER — Other Ambulatory Visit: Payer: Self-pay

## 2023-12-03 DIAGNOSIS — G4733 Obstructive sleep apnea (adult) (pediatric): Secondary | ICD-10-CM | POA: Diagnosis not present

## 2023-12-03 DIAGNOSIS — J45909 Unspecified asthma, uncomplicated: Secondary | ICD-10-CM | POA: Diagnosis not present

## 2023-12-03 DIAGNOSIS — K219 Gastro-esophageal reflux disease without esophagitis: Secondary | ICD-10-CM | POA: Diagnosis not present

## 2023-12-03 DIAGNOSIS — Z472 Encounter for removal of internal fixation device: Secondary | ICD-10-CM | POA: Diagnosis not present

## 2023-12-03 DIAGNOSIS — F32A Depression, unspecified: Secondary | ICD-10-CM | POA: Insufficient documentation

## 2023-12-03 DIAGNOSIS — I10 Essential (primary) hypertension: Secondary | ICD-10-CM | POA: Diagnosis not present

## 2023-12-03 DIAGNOSIS — F418 Other specified anxiety disorders: Secondary | ICD-10-CM

## 2023-12-03 DIAGNOSIS — E669 Obesity, unspecified: Secondary | ICD-10-CM | POA: Diagnosis not present

## 2023-12-03 DIAGNOSIS — F419 Anxiety disorder, unspecified: Secondary | ICD-10-CM | POA: Insufficient documentation

## 2023-12-03 DIAGNOSIS — M1990 Unspecified osteoarthritis, unspecified site: Secondary | ICD-10-CM | POA: Diagnosis not present

## 2023-12-03 DIAGNOSIS — S82852A Displaced trimalleolar fracture of left lower leg, initial encounter for closed fracture: Secondary | ICD-10-CM | POA: Diagnosis not present

## 2023-12-03 DIAGNOSIS — Z419 Encounter for procedure for purposes other than remedying health state, unspecified: Secondary | ICD-10-CM

## 2023-12-03 DIAGNOSIS — G4730 Sleep apnea, unspecified: Secondary | ICD-10-CM | POA: Insufficient documentation

## 2023-12-03 DIAGNOSIS — Z6841 Body Mass Index (BMI) 40.0 and over, adult: Secondary | ICD-10-CM | POA: Insufficient documentation

## 2023-12-03 HISTORY — DX: Sleep apnea, unspecified: G47.30

## 2023-12-03 HISTORY — PX: HARDWARE REMOVAL: SHX979

## 2023-12-03 SURGERY — REMOVAL, HARDWARE
Anesthesia: General | Site: Foot | Laterality: Left

## 2023-12-03 MED ORDER — LIDOCAINE 2% (20 MG/ML) 5 ML SYRINGE
INTRAMUSCULAR | Status: DC | PRN
  Administered 2023-12-03: 60 mg via INTRAVENOUS

## 2023-12-03 MED ORDER — SUCCINYLCHOLINE CHLORIDE 200 MG/10ML IV SOSY
PREFILLED_SYRINGE | INTRAVENOUS | Status: AC
  Filled 2023-12-03: qty 10

## 2023-12-03 MED ORDER — LIDOCAINE 2% (20 MG/ML) 5 ML SYRINGE
INTRAMUSCULAR | Status: AC
  Filled 2023-12-03: qty 5

## 2023-12-03 MED ORDER — FENTANYL CITRATE (PF) 100 MCG/2ML IJ SOLN
INTRAMUSCULAR | Status: AC
  Filled 2023-12-03: qty 2

## 2023-12-03 MED ORDER — PROPOFOL 10 MG/ML IV BOLUS
INTRAVENOUS | Status: DC | PRN
  Administered 2023-12-03: 200 mg via INTRAVENOUS

## 2023-12-03 MED ORDER — CEFAZOLIN SODIUM-DEXTROSE 2-4 GM/100ML-% IV SOLN
INTRAVENOUS | Status: AC
  Filled 2023-12-03: qty 100

## 2023-12-03 MED ORDER — ONDANSETRON HCL 4 MG/2ML IJ SOLN: 4.0000 mg | Freq: Once | INTRAMUSCULAR | Status: DC | PRN

## 2023-12-03 MED ORDER — OXYCODONE HCL 5 MG PO TABS
5.0000 mg | ORAL_TABLET | Freq: Once | ORAL | Status: AC | PRN
  Administered 2023-12-03: 5 mg via ORAL

## 2023-12-03 MED ORDER — CHLORHEXIDINE GLUCONATE 4 % EX SOLN: 60.0000 mL | Freq: Once | CUTANEOUS | Status: DC

## 2023-12-03 MED ORDER — BUPIVACAINE-EPINEPHRINE (PF) 0.5% -1:200000 IJ SOLN
INTRAMUSCULAR | Status: AC
  Filled 2023-12-03: qty 30

## 2023-12-03 MED ORDER — ONDANSETRON HCL 4 MG/2ML IJ SOLN
INTRAMUSCULAR | Status: DC | PRN
  Administered 2023-12-03: 4 mg via INTRAVENOUS

## 2023-12-03 MED ORDER — FENTANYL CITRATE (PF) 100 MCG/2ML IJ SOLN
INTRAMUSCULAR | Status: DC | PRN
  Administered 2023-12-03 (×2): 50 ug via INTRAVENOUS

## 2023-12-03 MED ORDER — OXYCODONE HCL 5 MG/5ML PO SOLN: 5.0000 mg | Freq: Once | ORAL | Status: AC | PRN

## 2023-12-03 MED ORDER — ONDANSETRON HCL 4 MG/2ML IJ SOLN
INTRAMUSCULAR | Status: AC
  Filled 2023-12-03: qty 2

## 2023-12-03 MED ORDER — DEXAMETHASONE SODIUM PHOSPHATE 10 MG/ML IJ SOLN
INTRAMUSCULAR | Status: AC
  Filled 2023-12-03: qty 1

## 2023-12-03 MED ORDER — KETOROLAC TROMETHAMINE 30 MG/ML IJ SOLN
INTRAMUSCULAR | Status: AC
  Filled 2023-12-03: qty 1

## 2023-12-03 MED ORDER — LACTATED RINGERS IV SOLN: INTRAVENOUS | Status: DC

## 2023-12-03 MED ORDER — BUPIVACAINE-EPINEPHRINE 0.5% -1:200000 IJ SOLN
INTRAMUSCULAR | Status: DC | PRN
  Administered 2023-12-03: 10 mL

## 2023-12-03 MED ORDER — SUCCINYLCHOLINE CHLORIDE 200 MG/10ML IV SOSY
PREFILLED_SYRINGE | INTRAVENOUS | Status: DC | PRN
  Administered 2023-12-03: 40 mg via INTRAVENOUS

## 2023-12-03 MED ORDER — FENTANYL CITRATE (PF) 100 MCG/2ML IJ SOLN
25.0000 ug | INTRAMUSCULAR | Status: DC | PRN
  Administered 2023-12-03: 50 ug via INTRAVENOUS

## 2023-12-03 MED ORDER — 0.9 % SODIUM CHLORIDE (POUR BTL) OPTIME
TOPICAL | Status: DC | PRN
  Administered 2023-12-03: 200 mL

## 2023-12-03 MED ORDER — CEFAZOLIN SODIUM-DEXTROSE 2-4 GM/100ML-% IV SOLN
2.0000 g | INTRAVENOUS | Status: AC
Start: 2023-12-03 — End: 2023-12-03
  Administered 2023-12-03: 2 g via INTRAVENOUS

## 2023-12-03 MED ORDER — OXYCODONE HCL 5 MG PO TABS
ORAL_TABLET | ORAL | Status: AC
  Filled 2023-12-03: qty 1

## 2023-12-03 MED ORDER — SODIUM CHLORIDE 0.9 % IV SOLN: INTRAVENOUS | Status: DC | PRN

## 2023-12-03 MED ORDER — AMISULPRIDE (ANTIEMETIC) 5 MG/2ML IV SOLN
10.0000 mg | Freq: Once | INTRAVENOUS | Status: DC | PRN
Start: 2023-12-03 — End: 2023-12-03

## 2023-12-03 SURGICAL SUPPLY — 56 items
BANDAGE ESMARK 6X9 LF (GAUZE/BANDAGES/DRESSINGS) ×1 IMPLANT
BLADE SURG 15 STRL LF DISP TIS (BLADE) ×4 IMPLANT
BNDG COHESIVE 4X5 TAN STRL LF (GAUZE/BANDAGES/DRESSINGS) ×1 IMPLANT
BNDG ELASTIC 4INX 5YD STR LF (GAUZE/BANDAGES/DRESSINGS) ×1 IMPLANT
BNDG ELASTIC 6INX 5YD STR LF (GAUZE/BANDAGES/DRESSINGS) IMPLANT
BNDG ESMARK 6X9 LF (GAUZE/BANDAGES/DRESSINGS)
BNDG GAUZE DERMACEA FLUFF 4 (GAUZE/BANDAGES/DRESSINGS) ×1 IMPLANT
BRUSH SCRUB EZ 4% CHG (MISCELLANEOUS) ×1 IMPLANT
CANISTER SUCT 1200ML W/VALVE (MISCELLANEOUS) ×1 IMPLANT
CHLORAPREP W/TINT 26 (MISCELLANEOUS) ×2 IMPLANT
COVER BACK TABLE 60X90IN (DRAPES) ×1 IMPLANT
CUFF TRNQT CYL 34X4.125X (TOURNIQUET CUFF) IMPLANT
DRAPE C-ARM 42X72 X-RAY (DRAPES) IMPLANT
DRAPE C-ARMOR (DRAPES) IMPLANT
DRAPE EXTREMITY T 121X128X90 (DISPOSABLE) ×1 IMPLANT
DRAPE IMP U-DRAPE 54X76 (DRAPES) ×1 IMPLANT
DRAPE OEC MINIVIEW 54X84 (DRAPES) IMPLANT
DRAPE U-SHAPE 47X51 STRL (DRAPES) ×1 IMPLANT
DRSG MEPITEL 4X7.2 (GAUZE/BANDAGES/DRESSINGS) ×1 IMPLANT
ELECT REM PT RETURN 9FT ADLT (ELECTROSURGICAL) ×1
ELECTRODE REM PT RTRN 9FT ADLT (ELECTROSURGICAL) ×1 IMPLANT
GAUZE PAD ABD 8X10 STRL (GAUZE/BANDAGES/DRESSINGS) IMPLANT
GAUZE SPONGE 4X4 12PLY STRL (GAUZE/BANDAGES/DRESSINGS) ×1 IMPLANT
GLOVE BIOGEL PI IND STRL 8 (GLOVE) ×1 IMPLANT
GLOVE SURG SS PI 7.5 STRL IVOR (GLOVE) ×2 IMPLANT
GOWN STRL REUS W/ TWL LRG LVL3 (GOWN DISPOSABLE) ×2 IMPLANT
MARKER SKIN DUAL TIP RULER LAB (MISCELLANEOUS) IMPLANT
NDL HYPO 25X1 1.5 SAFETY (NEEDLE) IMPLANT
NEEDLE HYPO 25X1 1.5 SAFETY (NEEDLE) ×1 IMPLANT
NS IRRIG 1000ML POUR BTL (IV SOLUTION) ×1 IMPLANT
PACK BASIN DAY SURGERY FS (CUSTOM PROCEDURE TRAY) ×1 IMPLANT
PAD CAST 4YDX4 CTTN HI CHSV (CAST SUPPLIES) ×1 IMPLANT
PADDING CAST ABS COTTON 4X4 ST (CAST SUPPLIES) IMPLANT
PADDING CAST COTTON 6X4 STRL (CAST SUPPLIES) ×1 IMPLANT
PADDING CAST SYNTHETIC 4X4 STR (CAST SUPPLIES) IMPLANT
PENCIL SMOKE EVACUATOR (MISCELLANEOUS) ×1 IMPLANT
SHEET MEDIUM DRAPE 40X70 STRL (DRAPES) ×1 IMPLANT
SLEEVE SCD COMPRESS KNEE MED (STOCKING) ×1 IMPLANT
SPIKE FLUID TRANSFER (MISCELLANEOUS) IMPLANT
SPLINT PLASTER CAST FAST 5X30 (CAST SUPPLIES) IMPLANT
SPONGE T-LAP 18X18 ~~LOC~~+RFID (SPONGE) ×1 IMPLANT
STAPLER SKIN PROX WIDE 3.9 (STAPLE) IMPLANT
STOCKINETTE 6 STRL (DRAPES) ×1 IMPLANT
STOCKINETTE ORTHO 6X25 (MISCELLANEOUS) ×1 IMPLANT
SUCTION TUBE FRAZIER 10FR DISP (SUCTIONS) IMPLANT
SUT ETHILON 2 0 FS 18 (SUTURE) ×2 IMPLANT
SUT MNCRL AB 3-0 PS2 18 (SUTURE) ×1 IMPLANT
SUT VIC AB 2-0 CT1 TAPERPNT 27 (SUTURE) IMPLANT
SUT VIC AB 2-0 SH 27XBRD (SUTURE) ×1 IMPLANT
SUT VIC AB 3-0 SH 27X BRD (SUTURE) IMPLANT
SUT VICRYL 0 SH 27 (SUTURE) IMPLANT
SYR BULB IRRIG 60ML STRL (SYRINGE) ×1 IMPLANT
SYR CONTROL 10ML LL (SYRINGE) IMPLANT
TOWEL GREEN STERILE FF (TOWEL DISPOSABLE) ×2 IMPLANT
TUBE CONNECTING 20X1/4 (TUBING) IMPLANT
UNDERPAD 30X36 HEAVY ABSORB (UNDERPADS AND DIAPERS) ×1 IMPLANT

## 2023-12-03 NOTE — Addendum Note (Signed)
Addendum  created 12/03/23 1555 by Yolanda Bonine, CRNA   Flowsheet accepted, Intraprocedure Event edited

## 2023-12-03 NOTE — Anesthesia Postprocedure Evaluation (Signed)
Anesthesia Post Note  Patient: Ginette Pitman  Procedure(s) Performed: HARDWARE REMOVAL OF SYNDEMOSIS (Left: Foot)     Patient location during evaluation: PACU Anesthesia Type: General Level of consciousness: awake and alert and oriented Pain management: pain level controlled Vital Signs Assessment: post-procedure vital signs reviewed and stable Respiratory status: spontaneous breathing, nonlabored ventilation and respiratory function stable Cardiovascular status: blood pressure returned to baseline and stable Postop Assessment: no apparent nausea or vomiting Anesthetic complications: no   No notable events documented.  Last Vitals:  Vitals:   12/03/23 1500 12/03/23 1509  BP: 127/71 (!) 140/76  Pulse: 83 74  Resp: 14 14  Temp:  36.6 C  SpO2: 92% 93%    Last Pain:  Vitals:   12/03/23 1509  TempSrc:   PainSc: 0-No pain                 Jalyah Weinheimer A.

## 2023-12-03 NOTE — Anesthesia Procedure Notes (Signed)
Procedure Name: LMA Insertion Date/Time: 12/03/2023 1:08 PM  Performed by: Yolanda Bonine, CRNAPre-anesthesia Checklist: Patient identified, Emergency Drugs available, Suction available, Patient being monitored and Timeout performed Patient Re-evaluated:Patient Re-evaluated prior to induction Oxygen Delivery Method: Circle system utilized Preoxygenation: Pre-oxygenation with 100% oxygen Induction Type: IV induction and Combination inhalational/ intravenous induction Ventilation: Two handed mask ventilation required LMA: LMA inserted LMA Size: 4.0 Number of attempts: 1

## 2023-12-03 NOTE — Transfer of Care (Signed)
Immediate Anesthesia Transfer of Care Note  Patient: Haley White  Procedure(s) Performed: HARDWARE REMOVAL OF SYNDEMOSIS (Left: Foot)  Patient Location: PACU  Anesthesia Type:General  Level of Consciousness: awake, drowsy, and patient cooperative  Airway & Oxygen Therapy: Patient Spontanous Breathing and Patient connected to face mask oxygen  Post-op Assessment: Report given to RN and Post -op Vital signs reviewed and stable  Post vital signs: Reviewed and stable  Last Vitals:  Vitals Value Taken Time  BP 150/83 12/03/23 1345  Temp 36.3 C 12/03/23 1342  Pulse 82 12/03/23 1346  Resp 16 12/03/23 1346  SpO2 99 % 12/03/23 1346  Vitals shown include unfiled device data.  Last Pain:  Vitals:   12/03/23 1342  TempSrc:   PainSc: Asleep      Patients Stated Pain Goal: 3 (12/03/23 1024)  Complications: No notable events documented.

## 2023-12-03 NOTE — Op Note (Signed)
12/03/2023  12:09 PM   PATIENT: Haley White  68 y.o. female  MRN: [id number]   PRE-OPERATIVE DIAGNOSIS:   Closed trimalleolar fracture of left ankle s/p ORIF with retained syndesmosis fixation hardware   POST-OPERATIVE DIAGNOSIS:   Same   PROCEDURE: Left ankle removal of deep hardware (syndesmosis fixation)   SURGEON:  Netta Cedars, MD   ASSISTANT: None   ANESTHESIA: General, regional   EBL: Minimal   TOURNIQUET:  5 min   COMPLICATIONS: None apparent   DISPOSITION: Extubated, awake and stable to recovery.   INDICATION FOR PROCEDURE: The patient presented with above diagnosis.  We discussed the diagnosis, alternative treatment options, risks and benefits of the above surgical intervention, as well as alternative non-operative treatments. All questions/concerns were addressed and the patient/family demonstrated appropriate understanding of the diagnosis, the procedure, the postoperative course, and overall prognosis. The patient wished to proceed with surgical intervention and signed an informed surgical consent as such, in each others presence prior to surgery.   PROCEDURE IN DETAIL: After preoperative consent was obtained and the correct operative site was identified, the patient was brought to the operating room supine on stretcher and transferred onto operating table. General anesthesia was induced. Preoperative antibiotics were administered. Surgical timeout was taken. The patient was then positioned supine with an ipsilateral hip bump. The operative lower extremity was prepped and draped in standard sterile fashion with a tourniquet around the thigh. The extremity was exsanguinated and the tourniquet was inflated to 275 mmHg.  Prior lateral approach was utilized over the distal fibula and dissection carried down to the level of plate. The syndesmosis screws were removed completely and stability of the ankle was noted to clinical and fluoroscopic testing.     The surgical sites were thoroughly irrigated. The tourniquet was deflated and hemostasis achieved. Betadine solution was used to irrigate and vancomycin powder applied. The skin was closed without tension.    The leg was cleaned with saline and sterile mepitel dressings with gauze were applied. A well padded sterile wrap was applied. The patient was awakened from anesthesia and transported to the recovery room in stable condition.    FOLLOW UP PLAN: -transfer to PACU, then home -strict NWB operative extremity until nerve block wears off, then OK to WBAT, maximum elevation -maintain dressings until follow up -DVT ppx: Aspirin 81 mg twice daily while NWB -follow up as outpatient within 7-10 days for wound check -sutures out in 2-3 weeks in outpatient office   RADIOGRAPHS: AP, lateral, oblique and stress radiographs of the operative ankle were obtained intraoperatively. These showed interval removal of the syndesmosis screws. Manual stress radiographs were taken and the joints were noted to be stable following hardware removal. No other acute injuries are noted.  Netta Cedars Orthopaedic Surgery [facility]

## 2023-12-03 NOTE — Anesthesia Preprocedure Evaluation (Addendum)
Anesthesia Evaluation  Patient identified by MRN, date of birth, ID band Patient awake    Reviewed: Allergy & Precautions, H&P , NPO status , Patient's Chart, lab work & pertinent test results, reviewed documented beta blocker date and time   Airway Mallampati: III  TM Distance: >3 FB Neck ROM: Full    Dental no notable dental hx. (+) Teeth Intact, Dental Advisory Given   Pulmonary asthma , sleep apnea    Pulmonary exam normal breath sounds clear to auscultation       Cardiovascular Exercise Tolerance: Good hypertension, Pt. on medications Normal cardiovascular exam Rhythm:Regular Rate:Normal     Neuro/Psych  PSYCHIATRIC DISORDERS Anxiety Depression    negative neurological ROS     GI/Hepatic Neg liver ROS,GERD  Medicated,,  Endo/Other  negative endocrine ROS    Renal/GU negative Renal ROS  negative genitourinary   Musculoskeletal  (+) Arthritis , Osteoarthritis,  S/P ORIF trimalleolar Fx left ankle   Abdominal  (+) + obese  Peds negative pediatric ROS (+)  Hematology negative hematology ROS (+)   Anesthesia Other Findings   Reproductive/Obstetrics negative OB ROS                              Anesthesia Physical Anesthesia Plan  ASA: 3  Anesthesia Plan: General   Post-op Pain Management: Minimal or no pain anticipated and Tylenol PO (pre-op)*   Induction: Intravenous  PONV Risk Score and Plan: 3 and Ondansetron, Dexamethasone and Treatment may vary due to age or medical condition  Airway Management Planned: LMA  Additional Equipment: None  Intra-op Plan:   Post-operative Plan: Extubation in OR  Informed Consent: I have reviewed the patients History and Physical, chart, labs and discussed the procedure including the risks, benefits and alternatives for the proposed anesthesia with the patient or authorized representative who has indicated his/her understanding and acceptance.      Dental advisory given  Plan Discussed with: Anesthesiologist and CRNA  Anesthesia Plan Comments: ( )        Anesthesia Quick Evaluation

## 2023-12-03 NOTE — H&P (Signed)
H&P Update:  -History and Physical Reviewed  -Patient has been re-examined  -No change in the plan of care  -The risks and benefits were presented and reviewed. The risks due to inability to remove part/all of hardware, recurrent instability, hardware/suture failure and/or irritation, new/persistent infection, stiffness, nerve/vessel/tendon injury or rerupture of repaired tendon, nonunion/malunion, allograft usage, wound healing issues, development of arthritis, failure of this surgery, possibility of external fixation with delayed definitive surgery, need for further surgery, thromboembolic events, anesthesia/medical complications, amputation, death among others were discussed. The patient acknowledged the explanation, agreed to proceed with the plan and a consent was signed.  Netta Cedars

## 2023-12-04 ENCOUNTER — Ambulatory Visit (HOSPITAL_BASED_OUTPATIENT_CLINIC_OR_DEPARTMENT_OTHER): Payer: Medicare HMO | Admitting: Physical Therapy

## 2023-12-04 ENCOUNTER — Encounter (HOSPITAL_BASED_OUTPATIENT_CLINIC_OR_DEPARTMENT_OTHER): Payer: Self-pay | Admitting: Orthopaedic Surgery

## 2023-12-09 DIAGNOSIS — M797 Fibromyalgia: Secondary | ICD-10-CM | POA: Diagnosis not present

## 2023-12-09 DIAGNOSIS — M47896 Other spondylosis, lumbar region: Secondary | ICD-10-CM | POA: Diagnosis not present

## 2023-12-09 DIAGNOSIS — M792 Neuralgia and neuritis, unspecified: Secondary | ICD-10-CM | POA: Diagnosis not present

## 2023-12-11 DIAGNOSIS — L508 Other urticaria: Secondary | ICD-10-CM | POA: Diagnosis not present

## 2023-12-11 DIAGNOSIS — D1801 Hemangioma of skin and subcutaneous tissue: Secondary | ICD-10-CM | POA: Diagnosis not present

## 2023-12-11 DIAGNOSIS — D225 Melanocytic nevi of trunk: Secondary | ICD-10-CM | POA: Diagnosis not present

## 2023-12-11 DIAGNOSIS — L814 Other melanin hyperpigmentation: Secondary | ICD-10-CM | POA: Diagnosis not present

## 2023-12-11 DIAGNOSIS — L821 Other seborrheic keratosis: Secondary | ICD-10-CM | POA: Diagnosis not present

## 2023-12-15 DIAGNOSIS — G4733 Obstructive sleep apnea (adult) (pediatric): Secondary | ICD-10-CM | POA: Diagnosis not present

## 2023-12-16 ENCOUNTER — Encounter: Payer: Self-pay | Admitting: Internal Medicine

## 2023-12-18 DIAGNOSIS — F331 Major depressive disorder, recurrent, moderate: Secondary | ICD-10-CM | POA: Diagnosis not present

## 2023-12-18 NOTE — Therapy (Incomplete)
OUTPATIENT PHYSICAL THERAPY LOWER EXTREMITY TREATMENT   Patient Name: Haley White MRN: [id number] DOB:Mar 29, 1956, 68 y.o., female Today's Date: 12/18/2023  END OF SESSION:     Past Medical History:  Diagnosis Date   Anxiety    Arthritis    Asthma    Cataract    Depression    Gout    Hyperlipidemia    Hypertension    Obese    Sleep apnea    Past Surgical History:  Procedure Laterality Date   ANKLE SURGERY Left    APPENDECTOMY  1984   APPENDECTOMY     CATARACT EXTRACTION W/ INTRAOCULAR LENS  IMPLANT, BILATERAL  2014   CESAREAN SECTION  1984, 1987, 1994   X3   CESAREAN SECTION     x3   COLONOSCOPY  11/16/2013   w/Brodie    EXTERNAL FIXATION LEG Left 06/04/2023   Procedure: EXTERNAL FIXATION LEG;  Surgeon: Netta Cedars, MD;  Location: [facility];  Service: Orthopedics;  Laterality: Left;   EYE SURGERY Bilateral    lasix   FOOT FRACTURE SURGERY Left    FOOT SURGERY Left 2002   FRACTURE SURGERY Left    left ankle   HARDWARE REMOVAL Left 12/03/2023   Procedure: HARDWARE REMOVAL OF SYNDEMOSIS;  Surgeon: Netta Cedars, MD;  Location: [facility];  Service: Orthopedics;  Laterality: Left;   KNEE SURGERY Left 1998   ORIF ANKLE FRACTURE Left 06/04/2023   Procedure: OPEN REDUCTION INTERNAL FIXATION (ORIF) ANKLE FRACTURE vs EX FIX;  Surgeon: Netta Cedars, MD;  Location: [facility];  Service: Orthopedics;  Laterality: Left;   ROTATOR CUFF REPAIR Right 2007   shoulder     torn rotator cuff   TUBAL LIGATION  1994   TUBAL LIGATION     Patient Active Problem List   Diagnosis Date Noted   OSA on CPAP 08/11/2023   Ankle fracture 06/03/2023   Trimalleolar fracture of left ankle 06/02/2023   Acute left ankle pain 06/02/2023   Sleep-related hypoxia 03/24/2023   At risk for obstructive sleep apnea 03/24/2023   Acute pulmonary embolism (HCC) 05/10/2022   Allergic rhinitis 03/15/2021   Gastro-esophageal reflux disease without esophagitis 03/15/2021   Mild  persistent asthma, uncomplicated 03/15/2021   Aortic atherosclerosis (HCC) by Chest CT scan on 02/02/2018  01/10/2021   Asthma    Low back pain 03/17/2019   Lumbosacral spondylosis without myelopathy 03/17/2019   Pseudogout 12/09/2017   Major depressive disorder, recurrent, severe without psychotic features (HCC)    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  Rationale for Evaluation and Treatment: Rehabilitation  ONSET DATE: July 2024  SUBJECTIVE:   SUBJECTIVE STATEMENT: Pt is " I'm having more pain with the colder weather".   Pt states she was told she will return to land PT and can get back to aquatics after 6 wks (s/p hardware removal).  Pt having ankle hardware removal on 1/29.  PERTINENT HISTORY: The patient is s/p left ankle trimalleolar & syndesmosis ORIF, DOS: 06/05/2023   Fibromyalgia, HTN, PE s/p covid in 2023 LBP with MRI (10/2022) evidence of chronic mild anterior wedge compression fracture of T12 and chronic anterior wedge compression deformity of L1   PAIN:  Are you having pain? Yes: NPRS scale: current 4/10 L post lat ankle peroneal area;  Pain location: see above Pain description: sharpe ache Aggravating factors: being up on it; standing/walk 45 mins. Relieving factors: resting elevating, icing  PRECAUTIONS: None wearing ankle brace from [facility] during day.  RED FLAGS: None   WEIGHT BEARING  RESTRICTIONS: WBAT  FALLS:  Has patient fallen in last 6 months? Yes. Number of falls 1 fell down wet hill walking dog  LIVING ENVIRONMENT: Lives with: lives alone Lives in: House/apartment Stairs: Yes: External: 3 steps; none Has following equipment at home: Single point cane   OCCUPATION: retired   PLOF: Independent  PATIENT GOALS: walk normally and as pain free as able  NEXT MD VISIT: Nov 12, 2022  OBJECTIVE:  Note: Objective measures were completed at Evaluation unless otherwise noted.  DIAGNOSTIC FINDINGS: IMPRESSION: 1. Acute trimalleolar fracture-subluxation as described above. 2. Additional small acute avulsion fracture of the anterolateral tibial plafond.   MRI cervical spine (05/28/2023): Severe left C5-6 neuroforaminal narrowing impinging on exiting left C6 nerve roots. Mild to moderate right and mild left C6-7 neuroforaminal narrowing. C7-T1 with moderate right and mild left facet arthrosis without narrowing.    MRI lumbar spine (11/01/2022): Mild levoconvex scoliosis. Chronic mild anterior wedge compression fracture of T12, 22% anterior body height loss and chronic anterior wedge compression deformity of L1 with 36% anterior body height loss. Moderate left L4-5 neuroforaminal narrowing with contact and mild dorsal lateral displacement of exiting left L4 nerve roots. Mild right L3-4 neuroforaminal narrowing. Mild multilevel retrolisthesis.     Assessment/Plan  Patient returns for follow-up and review of EMG/nerve conduction study. It demonstrates mild carpal tunnel syndrome but no evidence of cervical radiculopathy. Given EMG findings and no improvement of her symptoms with cervical epidural steroid injection, I suspect that her pain does not originate from her cervical spine. Physical exam is notable for marked tenderness over the lateral epicondyle and pain with resisted wrist extension; I suspect that she may have a degree of lateral epicondylitis. Would recommend  return to Dr. Melvyn Novas for evaluation of this pain as well as mild carpal tunnel syndrome. She does have a history of fibromyalgia which I believe complicates her pain presentation. Will also start her on Lyrica 25 mg twice daily. May uptitrate to 50 mg twice daily if no improvement. Follow-up for medication titration in 6 to 8 weeks.    PATIENT SURVEYS:  FOTO Primary score 40%; risk adjusted 46% with goal of 59%  COGNITION: Overall cognitive status: Within functional limits for tasks assessed     SENSATION: WFL  EDEMA:  Mod to minimal post left lat malleolus     PALPATION: Moderate TTP throughout left lat malleolus, peroneals and distal acchilles Minimal TTP medial malleolus at point of hardware  LOWER EXTREMITY ROM:  Active ROM Right eval Left eval Left  11/17/23  Hip flexion     Hip extension     Hip abduction     Hip adduction     Hip internal rotation     Hip external rotation     Knee flexion     Knee extension     Ankle dorsiflexion  -8 Lacking 2  Ankle plantarflexion  30d 42  Ankle inversion  Limited by 50%   Ankle eversion  Limited by 50%    (Blank rows = not tested)  LOWER EXTREMITY MMT:  MMT Right eval Left eval Left  Hip flexion     Hip extension     Hip abduction     Hip adduction     Hip internal rotation     Hip external rotation     Knee flexion     Knee extension     Ankle dorsiflexion 5 2+   Ankle plantarflexion 5 3-   Ankle inversion 5 3-   Ankle eversion 5 3-    (Blank rows = not tested)  GAIT: Distance walked: 500 ft Assistive device utilized: Single point cane Level of assistance: Complete Independence Comments: antalgic limp offset right                                                                                                                                TREATMENT DATE:  Pt seen for aquatic therapy today.  Treatment took place in water 3.5-4.75 ft in depth at the [facility] pool. Temp of water was 91.  Pt  entered/exited the pool via stairs independently in step-to pattern with bilat rail.  (Facing hot tub during session)  * straddling noodle, without UE support- cycling multiple widths, hip abdct/ addct, suspended jumping jack LEs * walking multiple laps forward/ backward with cues for heel/toe and toe/ heel * side stepping -> with arm addct/ abdct with rainbow hand floats * forward walking kicks  * high knee marching with rainbow hand floats under water at sides  * UE on wall: L stretch; heel/toe raises 2 x 10; hip abdct/ addct 2 x 10; hip ext  2 x 10; L stretch x 20s * Reciprocal arm swing with light resistance  bells wide stance then staggered 10 slow then 10 fast 2 sets in each position *SLS left 3 x 20s in 4 ft  Pt requires the buoyancy and hydrostatic pressure of water for support, and to offload joints by unweighting joint load by at least 50 % in navel deep water and by at least 75-80% in chest to neck deep water.  Viscosity of the water is needed for resistance of strengthening. Water current perturbations provides challenge to standing balance requiring increased core activation.  PATIENT EDUCATION:  Education details: reacquainting to aquatic therapy Person educated: Patient Education method: Explanation Education comprehension: verbalized understanding  HOME EXERCISE PROGRAM: Access Code: [email or web address] URL: [email or web address] Date: 10/28/2023 Prepared by: Geni Bers  Exercises - Seated Ankle Alphabet  - 3 x daily - 7 x weekly - 1 sets - 10 reps - Seated Heel Raise  - 2-3 x daily - 7 x weekly - 2 sets - 10 reps - Seated Toe Raise  - 2-3 x daily - 7 x weekly - 2 sets - 10 reps  ASSESSMENT:  CLINICAL IMPRESSION: Pt  is nearly 6.5 months s/p L ankle and trimalleoloar and syndesmosis ORIF and 2 weeks and 2 days s/p L ankle removal of deep hardware (syndesmosis fixation).  Pt scheduled for hardware removal next week.  Will await a referral to continue with  therapy land based after surgery. Improved tolerance for gait after some time spent cycling/suspended in deeper water. Pain in ankle reduced to 2/10 during session.  No increase in back pain; pt had ablation performed 2 wks ago with success.  Plan to discuss aquatic exercises to perform once she is able to return to water post surgery (issue updated HEP ?)     From initial impression:  Patient is a 68 y.o. f who was seen today for physical therapy evaluation and treatment for s/p left trimalleolar fx with repair. She is WBAT using a cane, sometimes a walker (if increase pain) and wearing and ankle brace when up during day.  She has moderate pain sensitivity with palpation throughout L lat malleolar area with particular discomfort about peroneal tendons.  She is limited in ankle ROM all planes limited > by pain than edema.  Edema minimal.  She will benefit from skilled PT to improve strength and ROM of left ankle improving gait , decreasing pain and returning pt to PLOF.  OBJECTIVE IMPAIRMENTS: Abnormal gait, decreased activity tolerance, decreased balance, difficulty walking, decreased ROM, decreased strength, obesity, and pain.   ACTIVITY LIMITATIONS: squatting, stairs, and locomotion level  PARTICIPATION LIMITATIONS: shopping and community activity  PERSONAL FACTORS: Past/current experiences and 1 comorbidity: back dysfunction  are also affecting patient's functional outcome.   REHAB POTENTIAL: Good  CLINICAL DECISION MAKING: Evolving/moderate complexity  EVALUATION COMPLEXITY: Moderate   GOALS: Goals reviewed with patient? Yes  SHORT TERM GOALS: Target date: 11/24/23 Pt will tolerate full aquatic sessions consistently without increase in pain and with improving function to demonstrate good toleration and effectiveness of intervention.  Baseline: Goal status: Met 11/24/23  2.  Pt will navigate stair into and out of pool 6 steps using step through pattern Baseline: step-to pattern Goal  status: In progress 11/10/23. Partially met 11/24/23  3.  Pt will report reduction in pain by 3 NPRS post aquatic session which lasts for up to 24 hours. Baseline:  Goal status: Met 11/24/23  4.  Pt will amb without antalgic gait pattern submerged in 3.6 ft Baseline:  Goal status:MET - 11/17/23   LONG TERM GOALS: Target date: 12/26/23  Pt to will improve foto score by at least 12 % to demonstrate improved perception of function. Baseline: 40% primary score Goal status: INITIAL  2.  Pt wiil navigate 1 flight of steps using step alternating pattern indep Baseline:  Goal status: INITIAL  3.  Pt will improve on left ankle ROM to 75% of normal Baseline: see chart Goal status: INITIAL  4.  Pt will improve on left ankle DF and PF strength by at least 1 grade to demonstrate improving functional ability Baseline: see chart Goal status: INITIAL  5.  Pt will amb without AD without limitation to pain Baseline:using cane or walker  Goal status: INITIAL    PLAN:  PT FREQUENCY: 1-2x/week  PT DURATION: 8 weeks  PLANNED INTERVENTIONS: 97164- PT Re-evaluation, 97110-Therapeutic exercises, 97530- Therapeutic activity, 97112- Neuromuscular re-education, 97535- Self Care, [address]- Manual therapy, [phone]- Gait training, [phone]- Orthotic Fit/training, [phone]- Aquatic Therapy, Patient/Family education, Balance training, Stair training, Taping, Dry Needling, Joint mobilization, DME instructions, Cryotherapy, and Moist heat  PLAN FOR NEXT SESSION: ankle ROM, strengthening.  Gait  retraining, balance retraining   Mayer Camel, [place] 12/18/23 8:42 PM [facility] [address] [place], [place], [address] Phone: [phone]   Fax:  [phone]

## 2023-12-19 ENCOUNTER — Ambulatory Visit (HOSPITAL_BASED_OUTPATIENT_CLINIC_OR_DEPARTMENT_OTHER): Payer: Medicare HMO | Attending: Orthopaedic Surgery | Admitting: Physical Therapy

## 2023-12-19 ENCOUNTER — Encounter (HOSPITAL_BASED_OUTPATIENT_CLINIC_OR_DEPARTMENT_OTHER): Payer: Self-pay

## 2023-12-19 DIAGNOSIS — J301 Allergic rhinitis due to pollen: Secondary | ICD-10-CM | POA: Diagnosis not present

## 2023-12-19 DIAGNOSIS — K219 Gastro-esophageal reflux disease without esophagitis: Secondary | ICD-10-CM | POA: Diagnosis not present

## 2023-12-19 DIAGNOSIS — J4530 Mild persistent asthma, uncomplicated: Secondary | ICD-10-CM | POA: Diagnosis not present

## 2023-12-19 DIAGNOSIS — Z91148 Patient's other noncompliance with medication regimen for other reason: Secondary | ICD-10-CM | POA: Diagnosis not present

## 2023-12-23 DIAGNOSIS — G4733 Obstructive sleep apnea (adult) (pediatric): Secondary | ICD-10-CM | POA: Diagnosis not present

## 2023-12-25 ENCOUNTER — Encounter (HOSPITAL_BASED_OUTPATIENT_CLINIC_OR_DEPARTMENT_OTHER): Payer: Self-pay | Admitting: Physical Therapy

## 2023-12-26 ENCOUNTER — Ambulatory Visit (HOSPITAL_BASED_OUTPATIENT_CLINIC_OR_DEPARTMENT_OTHER): Payer: Medicare HMO | Attending: Orthopaedic Surgery | Admitting: Physical Therapy

## 2023-12-26 ENCOUNTER — Encounter (HOSPITAL_BASED_OUTPATIENT_CLINIC_OR_DEPARTMENT_OTHER): Payer: Self-pay | Admitting: Physical Therapy

## 2023-12-30 DIAGNOSIS — F331 Major depressive disorder, recurrent, moderate: Secondary | ICD-10-CM | POA: Diagnosis not present

## 2024-01-01 DIAGNOSIS — J301 Allergic rhinitis due to pollen: Secondary | ICD-10-CM | POA: Diagnosis not present

## 2024-01-01 DIAGNOSIS — J3081 Allergic rhinitis due to animal (cat) (dog) hair and dander: Secondary | ICD-10-CM | POA: Diagnosis not present

## 2024-01-01 DIAGNOSIS — J3089 Other allergic rhinitis: Secondary | ICD-10-CM | POA: Diagnosis not present

## 2024-01-02 ENCOUNTER — Ambulatory Visit (HOSPITAL_BASED_OUTPATIENT_CLINIC_OR_DEPARTMENT_OTHER): Payer: Medicare HMO | Attending: Orthopaedic Surgery | Admitting: Physical Therapy

## 2024-01-02 ENCOUNTER — Encounter (HOSPITAL_BASED_OUTPATIENT_CLINIC_OR_DEPARTMENT_OTHER): Payer: Self-pay | Admitting: Physical Therapy

## 2024-01-02 DIAGNOSIS — M25672 Stiffness of left ankle, not elsewhere classified: Secondary | ICD-10-CM | POA: Diagnosis not present

## 2024-01-02 DIAGNOSIS — R262 Difficulty in walking, not elsewhere classified: Secondary | ICD-10-CM | POA: Diagnosis not present

## 2024-01-02 DIAGNOSIS — M6281 Muscle weakness (generalized): Secondary | ICD-10-CM | POA: Diagnosis not present

## 2024-01-02 DIAGNOSIS — M25572 Pain in left ankle and joints of left foot: Secondary | ICD-10-CM | POA: Insufficient documentation

## 2024-01-02 NOTE — Therapy (Addendum)
 OUTPATIENT PHYSICAL THERAPY LOWER EXTREMITY TREATMENT / Re-eval  Progress Note Reporting Period 10/28/2023 to 01/02/2024  See note below for Objective Data and Assessment of Progress/Goals.       Patient Name: Haley White MRN: [id number] DOB:December 28, 1955, 68 y.o., female Today's Date: 01/02/2024  END OF SESSION:  PT End of Session - 01/02/24 1041     Visit Number 9    Number of Visits 21    Date for PT Re-Evaluation 02/13/24    Authorization Type [employer] mcr    PT Start Time 1033    PT Stop Time 1115    PT Time Calculation (min) 42 min    Activity Tolerance Patient tolerated treatment well    Behavior During Therapy WFL for tasks assessed/performed               Past Medical History:  Diagnosis Date   Anxiety    Arthritis    Asthma    Cataract    Depression    Gout    Hyperlipidemia    Hypertension    Obese    Sleep apnea    Past Surgical History:  Procedure Laterality Date   ANKLE SURGERY Left    APPENDECTOMY  1984   APPENDECTOMY     CATARACT EXTRACTION W/ INTRAOCULAR LENS  IMPLANT, BILATERAL  2014   CESAREAN SECTION  1984, 1987, 1994   X3   CESAREAN SECTION     x3   COLONOSCOPY  11/16/2013   w/Brodie    EXTERNAL FIXATION LEG Left 06/04/2023   Procedure: EXTERNAL FIXATION LEG;  Surgeon: Netta Cedars, MD;  Location: [facility];  Service: Orthopedics;  Laterality: Left;   EYE SURGERY Bilateral    lasix   FOOT FRACTURE SURGERY Left    FOOT SURGERY Left 2002   FRACTURE SURGERY Left    left ankle   HARDWARE REMOVAL Left 12/03/2023   Procedure: HARDWARE REMOVAL OF SYNDEMOSIS;  Surgeon: Netta Cedars, MD;  Location: [facility];  Service: Orthopedics;  Laterality: Left;   KNEE SURGERY Left 1998   ORIF ANKLE FRACTURE Left 06/04/2023   Procedure: OPEN REDUCTION INTERNAL FIXATION (ORIF) ANKLE FRACTURE vs EX FIX;  Surgeon: Netta Cedars, MD;  Location: [facility];  Service: Orthopedics;  Laterality: Left;   ROTATOR CUFF REPAIR Right 2007    shoulder     torn rotator cuff   TUBAL LIGATION  1994   TUBAL LIGATION     Patient Active Problem List   Diagnosis Date Noted   OSA on CPAP 08/11/2023   Ankle fracture 06/03/2023   Trimalleolar fracture of left ankle 06/02/2023   Acute left ankle pain 06/02/2023   Sleep-related hypoxia 03/24/2023   At risk for obstructive sleep apnea 03/24/2023   Acute pulmonary embolism (HCC) 05/10/2022   Allergic rhinitis 03/15/2021   Gastro-esophageal reflux disease without esophagitis 03/15/2021   Mild persistent asthma, uncomplicated 03/15/2021   Aortic atherosclerosis (HCC) by Chest CT scan on 02/02/2018  01/10/2021   Asthma    Low back pain 03/17/2019   Lumbosacral spondylosis without myelopathy 03/17/2019   Pseudogout 12/09/2017   Major depressive disorder, recurrent, severe without psychotic features (HCC)    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   Suicide attempt by drug ingestion (HCC)    Vitamin D deficiency 08/22/2014   Hyperlipidemia, mixed 08/22/2014   Hypertension    Anxiety    Abnormal glucose    Class 2 severe obesity due to excess calories with serious comorbidity and body mass index (BMI) of 37.0  to 37.9 in adult [facility])     PCP: Lucky Cowboy MD  REFERRING PROVIDER: Netta Cedars, MD   REFERRING DIAG: [phone] (ICD-10-CM) - Displaced trimalleolar fracture of left lower leg, subsequent encounter for closed fracture with routine healing   THERAPY DIAG:  Pain in left ankle and joints of left foot  Stiffness of left ankle, not elsewhere classified  Muscle weakness (generalized)  Difficulty in walking, not elsewhere classified  Rationale for Evaluation and Treatment: Rehabilitation  ONSET DATE: July 2024  SUBJECTIVE:   SUBJECTIVE STATEMENT: Pt underwent left ankle removal of deep hardware (syndesmosis fixation) on 1/29.  Pt used a FWW for the 1st day and has been on the cane since.  Pt states MD informed her that her ankle looks good and not to get into  the pool until it's scarred over and the incision heals.   Pt states she has swelling.  Pt is limited with ambulation distance due to her ankle/foot and back.  Pt is able to ambulate a short trip in the grocery store, but has to push the cart.  Pt has difficulty with performing stairs.  Pt has difficulty standing if she has been sitting awhile.  Worst pain is with standing awhile.    PERTINENT HISTORY: The patient is s/p left ankle trimalleolar & syndesmosis ORIF, DOS: 06/05/2023.  Removal of deep hardware on 12/03/2023 Hx of foot/ankle surgery in 2022  Fibromyalgia, HTN, PE s/p covid in 2023 LBP with MRI (10/2022) evidence of chronic mild anterior wedge compression fracture of T12 and chronic anterior wedge compression deformity of L1   PAIN:  Are you having pain? Yes: NPRS scale: current 3-4/10 current, 7-8/10 worst, 0/10 best  Pain location:  L medial and lateral L ankle Pain description: sharp ache, dull, stabbing   PRECAUTIONS: None wearing ankle brace from [facility] during day.  RED FLAGS: None   WEIGHT BEARING RESTRICTIONS: WBAT  FALLS:  Has patient fallen in last 6 months? Yes. Number of falls 1 fell down wet hill walking dog  LIVING ENVIRONMENT: Lives with: lives alone Lives in: House/apartment Stairs: Yes: External: 3 steps; none Has following equipment at home: Single point cane   OCCUPATION: retired   PLOF: Independent  PATIENT GOALS: walk normally and as pain free as able  NEXT MD VISIT: Nov 12, 2022  OBJECTIVE:  Note: Objective measures were completed at Evaluation unless otherwise noted.  DIAGNOSTIC FINDINGS: Op note indicated:  X rays of ankle showed interval removal of the syndesmosis screws.  The joints were noted to be stable following hardware removal. No other acute injuries are noted.  IMPRESSION: 1. Acute trimalleolar fracture-subluxation as described above. 2. Additional small acute avulsion fracture of the anterolateral tibial plafond.    MRI cervical spine (05/28/2023): Severe left C5-6 neuroforaminal narrowing impinging on exiting left C6 nerve roots. Mild to moderate right and mild left C6-7 neuroforaminal narrowing. C7-T1 with moderate right and mild left facet arthrosis without narrowing.    MRI lumbar spine (11/01/2022): Mild levoconvex scoliosis. Chronic mild anterior wedge compression fracture of T12, 22% anterior body height loss and chronic anterior wedge compression deformity of L1 with 36% anterior body height loss. Moderate left L4-5 neuroforaminal narrowing with contact and mild dorsal lateral displacement of exiting left L4 nerve roots. Mild right L3-4 neuroforaminal narrowing. Mild multilevel retrolisthesis.     Assessment/Plan  Patient returns for follow-up and review of EMG/nerve conduction study. It demonstrates mild carpal tunnel syndrome but no evidence of cervical radiculopathy. Given EMG findings  and no improvement of her symptoms with cervical epidural steroid injection, I suspect that her pain does not originate from her cervical spine. Physical exam is notable for marked tenderness over the lateral epicondyle and pain with resisted wrist extension; I suspect that she may have a degree of lateral epicondylitis. Would recommend return to Dr. Melvyn Novas for evaluation of this pain as well as mild carpal tunnel syndrome. She does have a history of fibromyalgia which I believe complicates her pain presentation. Will also start her on Lyrica 25 mg twice daily. May uptitrate to 50 mg twice daily if no improvement. Follow-up for medication titration in 6 to 8 weeks.    TODAY'S TREATMENT:    PATIENT SURVEYS:  LEFS:  30/80  COGNITION: Overall cognitive status: Within functional limits for tasks assessed      OBSERVATION:  Pt wearing lumbar brace.   Incision has scabbing in mid region.  No drainage.  No signs of infection.  Pt has a cyst on dorsal surface of foot which she states has been there a year.    EDEMA:   Figure 8 measurements:  R:  52.3, L: 56.3   LOWER EXTREMITY ROM:  Active ROM Right eval Left eval Left  11/17/23 Right 2/28 Left 2/28 AROM/PROM  Hip flexion       Hip extension       Hip abduction       Hip adduction       Hip internal rotation       Hip external rotation       Knee flexion       Knee extension       Ankle dorsiflexion  -8 Lacking 2 14 Lacking 7 deg / 3  with pain  Ankle plantarflexion  30d 42 65 55  Ankle inversion  Limited by 50%  43 24  Ankle eversion  Limited by 50%  10 2 with pain   (Blank rows = not tested)   GAIT: Comments:  Pt ambulated with and without SPC.  Increased toe out on L, decreased toe off, decreased Wb'ing thru L LE, favors L LE with gait.  Pt states she has more pain ambulating without cane.               Therapeutic Exercise:                                                                                                    Pt had difficulty with ankle circles and also had some lateral ankle pain.  Pt had pain at cyst on dorsal foot with toe flex/ext AROM.  PT did not include in HEP.   Pt performed ankle pumps x10 and approx 8 reps.  PT instructed pt to not perform into a painful range and she should not have pain with ankle pumps.  Pt also performed supine SLR x10 reps and approx 8-10 reps.  PT gave pt a HEP handout and was educated in correct form and appropriate frequency.    See below for pt education.  PATIENT EDUCATION:  Education details:  POC, HEP,  exercise form, post op restrictions/limitations, using ice, relevant anatomy, and objective findings.   Person educated: Patient Education method: Explanation, demonstration, verbal and tactile cues, handout Education comprehension: verbalized understanding, verbal and tactile cues required, returned demonstration  HOME EXERCISE PROGRAM: Access Code: [email or web address] URL: [email or web address] Date: 10/28/2023 Prepared by: Geni Bers  Updated HEP: Prepared by Audie Clear 01/02/2024 - ANKLE PUMPS  - 2-3 x daily - 7 x weekly - 2 sets - 10 reps - Supine Active Straight Leg Raise  - 1 x daily - 6-7 x weekly - 2 sets - 5-10 reps  ASSESSMENT:  CLINICAL IMPRESSION: Pt is nearly 7 months s/p L ankle and trimalleoloar and syndesmosis ORIF and 4 weeks and 2 days s/p L ankle removal of deep hardware (syndesmosis fixation).  Pt performed aquatic therapy up until 11/27/23.  Pt has been absent from PT due to having her deep hardware removed.  Pt presents to Rx with expected post op findings/limitations of L ankle pain, limited ankle ROM, muscle weakness, swelling, and difficulty in walking.  Pt is limited with and has difficulty with her functional mobility skills.  She is ambulating with a cane.  Pt is limited with ambulation distance due to her ankle/foot and back pain.  Pt has difficulty with stairs and is limited with standing duration.  PT gave pt a HEP handout and was educated in correct form and appropriate frequency.  Pt demonstrates good understanding of HEP.  PT updated and modified goals.  Pt does have back pain which she gets lumbar ablations for to reduce her pain.  She was wearing a back brace today.  Pt should benefit from skilled PT to improve pain, ROM, gait, strength, and functional mobility and to address goals.    OBJECTIVE IMPAIRMENTS: Abnormal gait, decreased activity tolerance, decreased balance, difficulty walking, decreased ROM, decreased strength, obesity, and pain.   ACTIVITY LIMITATIONS: squatting, stairs, and locomotion level  PARTICIPATION LIMITATIONS: shopping and community activity  PERSONAL FACTORS: Past/current experiences and 1 comorbidity: back dysfunction  are also affecting patient's functional outcome.   REHAB POTENTIAL: Good  CLINICAL DECISION MAKING: Evolving/moderate complexity  EVALUATION COMPLEXITY: Moderate   GOALS:  SHORT TERM GOALS: Target date: 11/24/23 Pt will tolerate full aquatic sessions consistently without  increase in pain and with improving function to demonstrate good toleration and effectiveness of intervention.  Baseline: Goal status: Met 11/24/23  2.  Pt will navigate stair into and out of pool 6 steps using step through pattern Baseline: step-to pattern Goal status: In progress 11/10/23. Partially met 11/24/23  3.  Pt will report reduction in pain by 3 NPRS post aquatic session which lasts for up to 24 hours. Baseline:  Goal status: Met 11/24/23  4.  Pt will amb without antalgic gait pattern submerged in 3.6 ft Baseline:  Goal status:MET - 11/17/23  5.  Pt will demo improved quality of gait including increased toe off and stance time on L LE with reduced favoring of L LE. Goal status: INITIAL Target date:  01/23/2024  6.  Pt will demo improved L ankle AROM to at least 0 deg in DF and 7 deg in Eversion for improved stiffness and gait.  Goal status: INITIAL Target date:  01/23/2024     LONG TERM GOALS: Target date: 02/13/24  Pt will demonstrate an improvement in LEFS by at least 9 points for a clinically significant improvement in self perceived disability.  Baseline: 30/80 Goal status: INITIAL  2.  Pt wiil navigate 1 flight of steps  using step alternating pattern indep Baseline:  Goal status: INITIAL  3.  Pt will demonstrate improved DF AROM to be w/n 5 deg of R ankle for improved stiffness, gait, and performance of stairs.  Baseline: see chart Goal status: INITIAL  4.  Pt will demo left ankle strength to be at least 4/5 in DF and WFL in PF (tested with manual resistance in open chain) in order for improved performance of and tolerance with functional mobility Baseline: see chart Goal status: INITIAL  5.  Pt will amb without AD without limitation to pain Baseline:using cane or walker  Goal status: INITIAL     PLAN:  PT FREQUENCY: 2x/wk  PT DURATION: 6 weeks  PLANNED INTERVENTIONS: 97164- PT Re-evaluation, 97110-Therapeutic exercises, 97530- Therapeutic activity,  97112- Neuromuscular re-education, 97535- Self Care, 10960- Manual therapy, [phone]- Gait training, [phone]- Orthotic Fit/training, [phone]- Aquatic Therapy, Patient/Family education, Balance training, Stair training, Taping, Dry Needling, Joint mobilization, DME instructions, Cryotherapy, and Moist heat  PLAN FOR NEXT SESSION:  Review HEP and update if tolerable.  Cont with ankle ROM.  Gait training.   Audie Clear III PT, DPT 01/02/24 4:14 PM

## 2024-01-09 ENCOUNTER — Encounter (HOSPITAL_BASED_OUTPATIENT_CLINIC_OR_DEPARTMENT_OTHER): Payer: Self-pay

## 2024-01-09 ENCOUNTER — Ambulatory Visit (HOSPITAL_BASED_OUTPATIENT_CLINIC_OR_DEPARTMENT_OTHER): Payer: Medicare HMO | Admitting: Physical Therapy

## 2024-01-12 ENCOUNTER — Other Ambulatory Visit: Payer: Self-pay

## 2024-01-12 ENCOUNTER — Other Ambulatory Visit: Payer: Self-pay | Admitting: Family

## 2024-01-12 DIAGNOSIS — G4733 Obstructive sleep apnea (adult) (pediatric): Secondary | ICD-10-CM | POA: Diagnosis not present

## 2024-01-12 MED ORDER — LISINOPRIL 20 MG PO TABS: 40.0000 mg | ORAL_TABLET | Freq: Every day | ORAL | 0 refills | Status: DC

## 2024-01-13 DIAGNOSIS — F331 Major depressive disorder, recurrent, moderate: Secondary | ICD-10-CM | POA: Diagnosis not present

## 2024-01-13 NOTE — Therapy (Signed)
 OUTPATIENT PHYSICAL THERAPY LOWER EXTREMITY TREATMENT       Patient Name: Haley White MRN: [id number] DOB:1956/05/15, 68 y.o., female Today's Date: 01/15/2024  END OF SESSION:  PT End of Session - 01/14/24 0810     Visit Number 10    Number of Visits 21    Date for PT Re-Evaluation 02/13/24    Authorization Type aetna mcr    PT Start Time 0805    PT Stop Time 0844    PT Time Calculation (min) 39 min    Activity Tolerance Patient tolerated treatment well    Behavior During Therapy WFL for tasks assessed/performed                Past Medical History:  Diagnosis Date   Anxiety    Arthritis    Asthma    Cataract    Depression    Gout    Hyperlipidemia    Hypertension    Obese    Sleep apnea    Past Surgical History:  Procedure Laterality Date   ANKLE SURGERY Left    APPENDECTOMY  1984   APPENDECTOMY     CATARACT EXTRACTION W/ INTRAOCULAR LENS  IMPLANT, BILATERAL  2014   CESAREAN SECTION  1984, 1987, 1994   X3   CESAREAN SECTION     x3   COLONOSCOPY  11/16/2013   w/Brodie    EXTERNAL FIXATION LEG Left 06/04/2023   Procedure: EXTERNAL FIXATION LEG;  Surgeon: Netta Cedars, MD;  Location: [facility];  Service: Orthopedics;  Laterality: Left;   EYE SURGERY Bilateral    lasix   FOOT FRACTURE SURGERY Left    FOOT SURGERY Left 2002   FRACTURE SURGERY Left    left ankle   HARDWARE REMOVAL Left 12/03/2023   Procedure: HARDWARE REMOVAL OF SYNDEMOSIS;  Surgeon: Netta Cedars, MD;  Location: [facility];  Service: Orthopedics;  Laterality: Left;   KNEE SURGERY Left 1998   ORIF ANKLE FRACTURE Left 06/04/2023   Procedure: OPEN REDUCTION INTERNAL FIXATION (ORIF) ANKLE FRACTURE vs EX FIX;  Surgeon: Netta Cedars, MD;  Location: [facility];  Service: Orthopedics;  Laterality: Left;   ROTATOR CUFF REPAIR Right 2007   shoulder     torn rotator cuff   TUBAL LIGATION  1994   TUBAL LIGATION     Patient Active Problem List   Diagnosis Date Noted    OSA on CPAP 08/11/2023   Ankle fracture 06/03/2023   Trimalleolar fracture of left ankle 06/02/2023   Acute left ankle pain 06/02/2023   Sleep-related hypoxia 03/24/2023   At risk for obstructive sleep apnea 03/24/2023   Acute pulmonary embolism (HCC) 05/10/2022   Allergic rhinitis 03/15/2021   Gastro-esophageal reflux disease without esophagitis 03/15/2021   Mild persistent asthma, uncomplicated 03/15/2021   Aortic atherosclerosis (HCC) by Chest CT scan on 02/02/2018  01/10/2021   Asthma    Low back pain 03/17/2019   Lumbosacral spondylosis without myelopathy 03/17/2019   Pseudogout 12/09/2017   Major depressive disorder, recurrent, severe without psychotic features (HCC)    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   Suicide attempt by drug ingestion (HCC)    Vitamin D deficiency 08/22/2014   Hyperlipidemia, mixed 08/22/2014   Hypertension    Anxiety    Abnormal glucose    Class 2 severe obesity due to excess calories with serious comorbidity and body mass index (BMI) of 37.0 to 37.9 in adult [facility])     PCP: Lucky Cowboy MD  REFERRING PROVIDER: Netta Cedars, MD  REFERRING DIAG: S82.852D (ICD-10-CM) - Displaced trimalleolar fracture of left lower leg, subsequent encounter for closed fracture with routine healing   THERAPY DIAG:  Pain in left ankle and joints of left foot  Stiffness of left ankle, not elsewhere classified  Muscle weakness (generalized)  Difficulty in walking, not elsewhere classified  Rationale for Evaluation and Treatment: Rehabilitation  ONSET DATE: July 2024  SUBJECTIVE:   SUBJECTIVE STATEMENT: Pt is 6 weeks s/p left ankle removal of deep hardware (syndesmosis fixation).  Pt states she felt fine after prior Rx.  Pt reports her ankle is feeling better this week.  She was hurting last week probably due to the weather.  Pt was doing her HEP until she got the flu.   Pt states she has swelling.  Pt is limited with ambulation distance due to her  ankle/foot and back.  Pt has difficulty with performing stairs.  Pt has difficulty standing if she has been sitting awhile.  Worst pain is with standing awhile.    PERTINENT HISTORY: The patient is s/p left ankle trimalleolar & syndesmosis ORIF, DOS: 06/05/2023.  Removal of deep hardware on 12/03/2023 Hx of foot/ankle surgery in 2022  Fibromyalgia, HTN, PE s/p covid in 2023 LBP with MRI (10/2022) evidence of chronic mild anterior wedge compression fracture of T12 and chronic anterior wedge compression deformity of L1   PAIN:  Are you having pain? Yes: NPRS scale: current 2-3/10 current, 7-8/10 worst, 0/10 best  Pain location:  L medial and lateral L ankle Pain description: sharp ache, dull, stabbing   PRECAUTIONS: None wearing ankle brace from [facility] during day.  RED FLAGS: None   WEIGHT BEARING RESTRICTIONS: WBAT  FALLS:  Has patient fallen in last 6 months? Yes. Number of falls 1 fell down wet hill walking dog  LIVING ENVIRONMENT: Lives with: lives alone Lives in: House/apartment Stairs: Yes: External: 3 steps; none Has following equipment at home: Single point cane   OCCUPATION: retired   PLOF: Independent  PATIENT GOALS: walk normally and as pain free as able  NEXT MD VISIT: Nov 12, 2022  OBJECTIVE:  Note: Objective measures were completed at Evaluation unless otherwise noted.  DIAGNOSTIC FINDINGS: Op note indicated:  X rays of ankle showed interval removal of the syndesmosis screws.  The joints were noted to be stable following hardware removal. No other acute injuries are noted.  IMPRESSION: 1. Acute trimalleolar fracture-subluxation as described above. 2. Additional small acute avulsion fracture of the anterolateral tibial plafond.   MRI cervical spine (05/28/2023): Severe left C5-6 neuroforaminal narrowing impinging on exiting left C6 nerve roots. Mild to moderate right and mild left C6-7 neuroforaminal narrowing. C7-T1 with moderate right and mild left  facet arthrosis without narrowing.    MRI lumbar spine (11/01/2022): Mild levoconvex scoliosis. Chronic mild anterior wedge compression fracture of T12, 22% anterior body height loss and chronic anterior wedge compression deformity of L1 with 36% anterior body height loss. Moderate left L4-5 neuroforaminal narrowing with contact and mild dorsal lateral displacement of exiting left L4 nerve roots. Mild right L3-4 neuroforaminal narrowing. Mild multilevel retrolisthesis.     Assessment/Plan  Patient returns for follow-up and review of EMG/nerve conduction study. It demonstrates mild carpal tunnel syndrome but no evidence of cervical radiculopathy. Given EMG findings and no improvement of her symptoms with cervical epidural steroid injection, I suspect that her pain does not originate from her cervical spine. Physical exam is notable for marked tenderness over the lateral epicondyle and pain with resisted wrist extension;  I suspect that she may have a degree of lateral epicondylitis. Would recommend return to Dr. Melvyn Novas for evaluation of this pain as well as mild carpal tunnel syndrome. She does have a history of fibromyalgia which I believe complicates her pain presentation. Will also start her on Lyrica 25 mg twice daily. May uptitrate to 50 mg twice daily if no improvement. Follow-up for medication titration in 6 to 8 weeks.    TODAY'S TREATMENT:               Therapeutic Exercise:                                                                                                    Reviewed current function, HEP compliance, pain level, and response to prior Rx. Pt received L ankle PROM in DF, PF, Eve, and Inv.  Review HEP.  Ankle pumps 2x10 Supine SLR 2x10 Toe flex/ext x 10 reps Ankle circles 2x10 ccw, x8 and approx 5 cw Ankle ABC's x 1 rep with 2 rest breaks Gastroc stretch with strap 3x20 sec S/L hip abd 2x10 Weight shifts s/s x 10 with Ue's on counter.  PT updated HEP and gave pt a HEP  handout.  Pt was educated in correct form and appropriate frequency.  PT instructed pt to not perform exercises into a painful range and she should not have increased pain.    See below for pt education.  PATIENT EDUCATION:  Education details:  POC, HEP, exercise form, post op restrictions/limitations, using ice, and relevant anatomy.   Person educated: Patient Education method: Explanation, demonstration, verbal and tactile cues, handout Education comprehension: verbalized understanding, verbal and tactile cues required, returned demonstration  HOME EXERCISE PROGRAM: Access Code: [email or web address] URL: [email or web address] Date: 10/28/2023 Prepared by: Geni Bers  Updated HEP: Prepared by Audie Clear 01/02/2024 - ANKLE PUMPS  - 2-3 x daily - 7 x weekly - 2 sets - 10 reps - Supine Active Straight Leg Raise  - 1 x daily - 6-7 x weekly - 2 sets - 5-10 reps  Updated HEP: - Seated Ankle Alphabet  - 2 x daily - 7 x weekly - 1 reps - Long Sitting Calf Stretch with Strap  - 2 x daily - 7 x weekly - 2-3 reps - 20 seconds hold  ASSESSMENT:  CLINICAL IMPRESSION: Pt tolerated ankle PROM well.  Pt has better tolerance with ankle AROM today though is limited with ROM due to pain.  She is able to perform ankle circles counter-clockwise well though had pain performing clockwise.  She limited her reps with clockwise.  Pt performed ankle ABC's and required 2 rest breaks to complete.  Pt continues to have pain with toe flex and extension AROM.  PT updated HEP and gave pt a HEP handout.  Pt demonstrates good understanding.  Pt tolerated treatment well and had no c/o's after Rx.  Pt should benefit from skilled PT to improve pain, ROM, gait, strength, and functional mobility and to address goals.   OBJECTIVE IMPAIRMENTS: Abnormal gait, decreased activity tolerance, decreased balance, difficulty walking, decreased ROM, decreased strength, obesity,  and pain.   ACTIVITY LIMITATIONS: squatting,  stairs, and locomotion level  PARTICIPATION LIMITATIONS: shopping and community activity  PERSONAL FACTORS: Past/current experiences and 1 comorbidity: back dysfunction  are also affecting patient's functional outcome.   REHAB POTENTIAL: Good  CLINICAL DECISION MAKING: Evolving/moderate complexity  EVALUATION COMPLEXITY: Moderate   GOALS:  SHORT TERM GOALS: Target date: 11/24/23 Pt will tolerate full aquatic sessions consistently without increase in pain and with improving function to demonstrate good toleration and effectiveness of intervention.  Baseline: Goal status: Met 11/24/23  2.  Pt will navigate stair into and out of pool 6 steps using step through pattern Baseline: step-to pattern Goal status: In progress 11/10/23. Partially met 11/24/23  3.  Pt will report reduction in pain by 3 NPRS post aquatic session which lasts for up to 24 hours. Baseline:  Goal status: Met 11/24/23  4.  Pt will amb without antalgic gait pattern submerged in 3.6 ft Baseline:  Goal status:MET - 11/17/23  5.  Pt will demo improved quality of gait including increased toe off and stance time on L LE with reduced favoring of L LE. Goal status: INITIAL Target date:  01/23/2024  6.  Pt will demo improved L ankle AROM to at least 0 deg in DF and 7 deg in Eversion for improved stiffness and gait.  Goal status: INITIAL Target date:  01/23/2024     LONG TERM GOALS: Target date: 02/13/24  Pt will demonstrate an improvement in LEFS by at least 9 points for a clinically significant improvement in self perceived disability.  Baseline: 30/80 Goal status: INITIAL  2.  Pt wiil navigate 1 flight of steps using step alternating pattern indep Baseline:  Goal status: INITIAL  3.  Pt will demonstrate improved DF AROM to be w/n 5 deg of R ankle for improved stiffness, gait, and performance of stairs.  Baseline: see chart Goal status: INITIAL  4.  Pt will demo left ankle strength to be at least 4/5 in DF and WFL  in PF (tested with manual resistance in open chain) in order for improved performance of and tolerance with functional mobility Baseline: see chart Goal status: INITIAL  5.  Pt will amb without AD without limitation to pain Baseline:using cane or walker  Goal status: INITIAL     PLAN:  PT FREQUENCY: 2x/wk  PT DURATION: 6 weeks  PLANNED INTERVENTIONS: 97164- PT Re-evaluation, 97110-Therapeutic exercises, 97530- Therapeutic activity, 97112- Neuromuscular re-education, 97535- Self Care, 40981- Manual therapy, [phone]- Gait training, [phone]- Orthotic Fit/training, [phone]- Aquatic Therapy, Patient/Family education, Balance training, Stair training, Taping, Dry Needling, Joint mobilization, DME instructions, Cryotherapy, and Moist heat  PLAN FOR NEXT SESSION:  Review HEP and update if tolerable.  Cont with ankle ROM.  Gait training. Add ankle inv/eve with foot on towel next visit.    Audie Clear III PT, DPT 01/15/24 8:15 AM

## 2024-01-14 ENCOUNTER — Encounter (HOSPITAL_BASED_OUTPATIENT_CLINIC_OR_DEPARTMENT_OTHER): Payer: Self-pay | Admitting: Physical Therapy

## 2024-01-14 ENCOUNTER — Ambulatory Visit (HOSPITAL_BASED_OUTPATIENT_CLINIC_OR_DEPARTMENT_OTHER): Payer: Medicare HMO | Attending: Orthopaedic Surgery | Admitting: Physical Therapy

## 2024-01-14 DIAGNOSIS — M25672 Stiffness of left ankle, not elsewhere classified: Secondary | ICD-10-CM | POA: Insufficient documentation

## 2024-01-14 DIAGNOSIS — M6281 Muscle weakness (generalized): Secondary | ICD-10-CM | POA: Insufficient documentation

## 2024-01-14 DIAGNOSIS — M25572 Pain in left ankle and joints of left foot: Secondary | ICD-10-CM | POA: Insufficient documentation

## 2024-01-14 DIAGNOSIS — R262 Difficulty in walking, not elsewhere classified: Secondary | ICD-10-CM | POA: Diagnosis not present

## 2024-01-16 ENCOUNTER — Ambulatory Visit (HOSPITAL_BASED_OUTPATIENT_CLINIC_OR_DEPARTMENT_OTHER): Payer: Medicare HMO | Admitting: Physical Therapy

## 2024-01-16 ENCOUNTER — Encounter (HOSPITAL_BASED_OUTPATIENT_CLINIC_OR_DEPARTMENT_OTHER): Payer: Self-pay | Admitting: Physical Therapy

## 2024-01-16 DIAGNOSIS — R262 Difficulty in walking, not elsewhere classified: Secondary | ICD-10-CM

## 2024-01-16 DIAGNOSIS — M25572 Pain in left ankle and joints of left foot: Secondary | ICD-10-CM

## 2024-01-16 DIAGNOSIS — M25672 Stiffness of left ankle, not elsewhere classified: Secondary | ICD-10-CM | POA: Diagnosis not present

## 2024-01-16 DIAGNOSIS — M6281 Muscle weakness (generalized): Secondary | ICD-10-CM | POA: Diagnosis not present

## 2024-01-16 NOTE — Therapy (Signed)
 OUTPATIENT PHYSICAL THERAPY LOWER EXTREMITY TREATMENT       Patient Name: Haley White MRN: [id number] DOB:12-Jan-1956, 68 y.o., female Today's Date: 01/16/2024  END OF SESSION:  PT End of Session - 01/16/24 1102     Visit Number 11    Number of Visits 21    Date for PT Re-Evaluation 02/13/24    Authorization Type aetna mcr    Progress Note Due on Visit 19    PT Start Time 1102    PT Stop Time 1140    PT Time Calculation (min) 38 min                 Past Medical History:  Diagnosis Date   Anxiety    Arthritis    Asthma    Cataract    Depression    Gout    Hyperlipidemia    Hypertension    Obese    Sleep apnea    Past Surgical History:  Procedure Laterality Date   ANKLE SURGERY Left    APPENDECTOMY  1984   APPENDECTOMY     CATARACT EXTRACTION W/ INTRAOCULAR LENS  IMPLANT, BILATERAL  2014   CESAREAN SECTION  1984, 1987, 1994   X3   CESAREAN SECTION     x3   COLONOSCOPY  11/16/2013   w/Brodie    EXTERNAL FIXATION LEG Left 06/04/2023   Procedure: EXTERNAL FIXATION LEG;  Surgeon: Netta Cedars, MD;  Location: [facility];  Service: Orthopedics;  Laterality: Left;   EYE SURGERY Bilateral    lasix   FOOT FRACTURE SURGERY Left    FOOT SURGERY Left 2002   FRACTURE SURGERY Left    left ankle   HARDWARE REMOVAL Left 12/03/2023   Procedure: HARDWARE REMOVAL OF SYNDEMOSIS;  Surgeon: Netta Cedars, MD;  Location: [facility];  Service: Orthopedics;  Laterality: Left;   KNEE SURGERY Left 1998   ORIF ANKLE FRACTURE Left 06/04/2023   Procedure: OPEN REDUCTION INTERNAL FIXATION (ORIF) ANKLE FRACTURE vs EX FIX;  Surgeon: Netta Cedars, MD;  Location: [facility];  Service: Orthopedics;  Laterality: Left;   ROTATOR CUFF REPAIR Right 2007   shoulder     torn rotator cuff   TUBAL LIGATION  1994   TUBAL LIGATION     Patient Active Problem List   Diagnosis Date Noted   OSA on CPAP 08/11/2023   Ankle fracture 06/03/2023   Trimalleolar fracture of  left ankle 06/02/2023   Acute left ankle pain 06/02/2023   Sleep-related hypoxia 03/24/2023   At risk for obstructive sleep apnea 03/24/2023   Acute pulmonary embolism (HCC) 05/10/2022   Allergic rhinitis 03/15/2021   Gastro-esophageal reflux disease without esophagitis 03/15/2021   Mild persistent asthma, uncomplicated 03/15/2021   Aortic atherosclerosis (HCC) by Chest CT scan on 02/02/2018  01/10/2021   Asthma    Low back pain 03/17/2019   Lumbosacral spondylosis without myelopathy 03/17/2019   Pseudogout 12/09/2017   Major depressive disorder, recurrent, severe without psychotic features (HCC)    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   Suicide attempt by drug ingestion (HCC)    Vitamin D deficiency 08/22/2014   Hyperlipidemia, mixed 08/22/2014   Hypertension    Anxiety    Abnormal glucose    Class 2 severe obesity due to excess calories with serious comorbidity and body mass index (BMI) of 37.0 to 37.9 in adult [facility])     PCP: Lucky Cowboy MD  REFERRING PROVIDER: Netta Cedars, MD   REFERRING DIAG: S82.852D (ICD-10-CM) - Displaced trimalleolar fracture  of left lower leg, subsequent encounter for closed fracture with routine healing   THERAPY DIAG:  Pain in left ankle and joints of left foot  Stiffness of left ankle, not elsewhere classified  Muscle weakness (generalized)  Difficulty in walking, not elsewhere classified  Rationale for Evaluation and Treatment: Rehabilitation  ONSET DATE: July 2024  SUBJECTIVE:   SUBJECTIVE STATEMENT: Pt does note some itching about her ankle that has been going on for a few weeks, continues to have fluctuating swelling. States she hasn't been using ankle brace very much. Mild soreness after last session, HEP going well.    PERTINENT HISTORY: The patient is s/p left ankle trimalleolar & syndesmosis ORIF, DOS: 06/05/2023.  Removal of deep hardware on 12/03/2023 Hx of foot/ankle surgery in 2022  Fibromyalgia, HTN, PE s/p  covid in 2023 LBP with MRI (10/2022) evidence of chronic mild anterior wedge compression fracture of T12 and chronic anterior wedge compression deformity of L1   PAIN:  Are you having pain? Yes: NPRS scale: 1-2/10 L ankle Pain location:  L medial and lateral L ankle Pain description: sharp ache, dull, stabbing   PRECAUTIONS: None wearing ankle brace from [facility] during day.  RED FLAGS: None   WEIGHT BEARING RESTRICTIONS: WBAT  FALLS:  Has patient fallen in last 6 months? Yes. Number of falls 1 fell down wet hill walking dog  LIVING ENVIRONMENT: Lives with: lives alone Lives in: House/apartment Stairs: Yes: External: 3 steps; none Has following equipment at home: Single point cane   OCCUPATION: retired   PLOF: Independent  PATIENT GOALS: walk normally and as pain free as able  NEXT MD VISIT: 01/23/24  OBJECTIVE:  Note: Objective measures were completed at Evaluation unless otherwise noted. (Patient survey section to Gait section were copied from 01/02/24 progress note)  PATIENT SURVEYS:  LEFS:  30/80   COGNITION: Overall cognitive status: Within functional limits for tasks assessed                           OBSERVATION:            Pt wearing lumbar brace.             Incision has scabbing in mid region.  No drainage.  No signs of infection.            Pt has a cyst on dorsal surface of foot which she states has been there a year.     EDEMA:  Figure 8 measurements:  R:  52.3, L: 56.3     LOWER EXTREMITY ROM:   Active ROM Right eval Left eval Left  11/17/23 Right 2/28 Left 2/28 AROM/PROM  Hip flexion            Hip extension            Hip abduction            Hip adduction            Hip internal rotation            Hip external rotation            Knee flexion            Knee extension            Ankle dorsiflexion   -8 Lacking 2 14 Lacking 7 deg / 3  with pain  Ankle plantarflexion   30d 42 65 55  Ankle inversion   Limited by  50%   43 24   Ankle eversion   Limited by 50%   10 2 with pain   (Blank rows = not tested)     GAIT: Comments:  Pt ambulated with and without SPC.  Increased toe out on L, decreased toe off, decreased Wb'ing thru L LE, favors L LE with gait.  Pt states she has more pain ambulating without cane.   TODAY'S TREATMENT:  [facility] Adult PT Treatment:                                                DATE: 01/16/24 Therapeutic Exercise: Ankle pumps 2x12 Ankle circles x8 CCW, x8 CW Ankle ABCs 1 rep (does not require any breaks today) Seated calf stretch w/ strap 2x30sec LLE cues for comfortable ROM HEP discussion/education  Neuromuscular re-ed: Seated NWB toe ext x10 LLE Seated NWB isolated hallux ext 2x10 LLE Seated towel scrunch 2x10 w/ cues for tripod foot and reduced compensations    PATIENT EDUCATION:  Education details: rationale for interventions, HEP  Person educated: Patient Education method: Explanation, Demonstration, Tactile cues, Verbal cues Education comprehension: verbalized understanding, returned demonstration, verbal cues required, tactile cues required, and needs further education     HOME EXERCISE PROGRAM: Access Code: [email or web address] URL: [email or web address] Date: 10/28/2023 Prepared by: Geni Bers  Updated HEP: Prepared by Audie Clear 01/02/2024 - ANKLE PUMPS  - 2-3 x daily - 7 x weekly - 2 sets - 10 reps - Supine Active Straight Leg Raise  - 1 x daily - 6-7 x weekly - 2 sets - 5-10 reps  Updated HEP: - Seated Ankle Alphabet  - 2 x daily - 7 x weekly - 1 reps - Long Sitting Calf Stretch with Strap  - 2 x daily - 7 x weekly - 2-3 reps - 20 seconds hold  ASSESSMENT:  CLINICAL IMPRESSION: Pt arrives w/ minimal ankle pain today. Notes improving stiffness with majority of activities today although continues to have difficulty/pain with ankle circles. We are able to improve tolerance with cues for reduced amplitude. Also progressing motor control exercises for foot  intrinsic/extrinsic musculature. Initially has difficulty isolating hallux but improves quite a bit with repetition. No adverse events, tolerates session well overall. Recommend continuing along current POC in order to address relevant deficits and improve functional tolerance. Pt departs today's session in no acute distress, all voiced questions/concerns addressed appropriately from PT perspective.      OBJECTIVE IMPAIRMENTS: Abnormal gait, decreased activity tolerance, decreased balance, difficulty walking, decreased ROM, decreased strength, obesity, and pain.   ACTIVITY LIMITATIONS: squatting, stairs, and locomotion level  PARTICIPATION LIMITATIONS: shopping and community activity  PERSONAL FACTORS: Past/current experiences and 1 comorbidity: back dysfunction  are also affecting patient's functional outcome.   REHAB POTENTIAL: Good  CLINICAL DECISION MAKING: Evolving/moderate complexity  EVALUATION COMPLEXITY: Moderate   GOALS:  SHORT TERM GOALS: Target date: 11/24/23 Pt will tolerate full aquatic sessions consistently without increase in pain and with improving function to demonstrate good toleration and effectiveness of intervention.  Baseline: Goal status: Met 11/24/23  2.  Pt will navigate stair into and out of pool 6 steps using step through pattern Baseline: step-to pattern Goal status: In progress 11/10/23. Partially met 11/24/23  3.  Pt will report reduction in pain by 3 NPRS post aquatic session which lasts for up to 24 hours. Baseline:  Goal status:  Met 11/24/23  4.  Pt will amb without antalgic gait pattern submerged in 3.6 ft Baseline:  Goal status:MET - 11/17/23  5.  Pt will demo improved quality of gait including increased toe off and stance time on L LE with reduced favoring of L LE. Goal status: INITIAL Target date:  01/23/2024  6.  Pt will demo improved L ankle AROM to at least 0 deg in DF and 7 deg in Eversion for improved stiffness and gait.  Goal status:  INITIAL Target date:  01/23/2024     LONG TERM GOALS: Target date: 02/13/24  Pt will demonstrate an improvement in LEFS by at least 9 points for a clinically significant improvement in self perceived disability.  Baseline: 30/80 Goal status: INITIAL  2.  Pt wiil navigate 1 flight of steps using step alternating pattern indep Baseline:  Goal status: INITIAL  3.  Pt will demonstrate improved DF AROM to be w/n 5 deg of R ankle for improved stiffness, gait, and performance of stairs.  Baseline: see chart Goal status: INITIAL  4.  Pt will demo left ankle strength to be at least 4/5 in DF and WFL in PF (tested with manual resistance in open chain) in order for improved performance of and tolerance with functional mobility Baseline: see chart Goal status: INITIAL  5.  Pt will amb without AD without limitation to pain Baseline:using cane or walker  Goal status: INITIAL     PLAN:  PT FREQUENCY: 2x/wk  PT DURATION: 6 weeks  PLANNED INTERVENTIONS: 97164- PT Re-evaluation, 97110-Therapeutic exercises, 97530- Therapeutic activity, 97112- Neuromuscular re-education, 97535- Self Care, 82956- Manual therapy, [phone]- Gait training, [phone]- Orthotic Fit/training, [phone]- Aquatic Therapy, Patient/Family education, Balance training, Stair training, Taping, Dry Needling, Joint mobilization, DME instructions, Cryotherapy, and Moist heat  PLAN FOR NEXT SESSION:  Review HEP and update if tolerable.  Cont with ankle ROM.  Gait training. Consider eversion/inversion AROM w/ towel next visit   Ashley Murrain PT, DPT 01/16/2024 11:42 AM

## 2024-01-20 DIAGNOSIS — Z4789 Encounter for other orthopedic aftercare: Secondary | ICD-10-CM | POA: Diagnosis not present

## 2024-01-20 DIAGNOSIS — M79645 Pain in left finger(s): Secondary | ICD-10-CM | POA: Diagnosis not present

## 2024-01-22 ENCOUNTER — Ambulatory Visit: Payer: Medicare HMO | Admitting: Internal Medicine

## 2024-01-22 NOTE — Therapy (Signed)
 OUTPATIENT PHYSICAL THERAPY LOWER EXTREMITY TREATMENT       Patient Name: Haley White MRN: [id number] DOB:1956/08/18, 68 y.o., female Today's Date: 01/23/2024  END OF SESSION:  PT End of Session - 01/23/24 1056     Visit Number 12    Number of Visits 21    Date for PT Re-Evaluation 02/13/24    Authorization Type [employer] mcr    Progress Note Due on Visit 19    PT Start Time 1058    PT Stop Time 1141    PT Time Calculation (min) 43 min    Activity Tolerance Patient tolerated treatment well                  Past Medical History:  Diagnosis Date   Anxiety    Arthritis    Asthma    Cataract    Depression    Gout    Hyperlipidemia    Hypertension    Obese    Sleep apnea    Past Surgical History:  Procedure Laterality Date   ANKLE SURGERY Left    APPENDECTOMY  1984   APPENDECTOMY     CATARACT EXTRACTION W/ INTRAOCULAR LENS  IMPLANT, BILATERAL  2014   CESAREAN SECTION  1984, 1987, 1994   X3   CESAREAN SECTION     x3   COLONOSCOPY  11/16/2013   w/Brodie    EXTERNAL FIXATION LEG Left 06/04/2023   Procedure: EXTERNAL FIXATION LEG;  Surgeon: Netta Cedars, MD;  Location: [facility];  Service: Orthopedics;  Laterality: Left;   EYE SURGERY Bilateral    lasix   FOOT FRACTURE SURGERY Left    FOOT SURGERY Left 2002   FRACTURE SURGERY Left    left ankle   HARDWARE REMOVAL Left 12/03/2023   Procedure: HARDWARE REMOVAL OF SYNDEMOSIS;  Surgeon: Netta Cedars, MD;  Location:  S[facility]ERY [facility];  Service: Orthopedics;  Laterality: Left;   KNEE SURGERY Left 1998   ORIF ANKLE FRACTURE Left 06/04/2023   Procedure: OPEN REDUCTION INTERNAL FIXATION (ORIF) ANKLE FRACTURE vs EX FIX;  Surgeon: Netta Cedars, MD;  Location: [facility];  Service: Orthopedics;  Laterality: Left;   ROTATOR CUFF REPAIR Right 2007   shoulder     torn rotator cuff   TUBAL LIGATION  1994   TUBAL LIGATION     Patient Active Problem List   Diagnosis Date Noted   OSA on CPAP 08/11/2023    Ankle fracture 06/03/2023   Trimalleolar fracture of left ankle 06/02/2023   Acute left ankle pain 06/02/2023   Sleep-related hypoxia 03/24/2023   At risk for obstructive sleep apnea 03/24/2023   Acute pulmonary embolism (HCC) 05/10/2022   Allergic rhinitis 03/15/2021   Gastro-esophageal reflux disease without esophagitis 03/15/2021   Mild persistent asthma, uncomplicated 03/15/2021   Aortic atherosclerosis (HCC) by Chest CT scan on 02/02/2018  01/10/2021   Asthma    Low back pain 03/17/2019   Lumbosacral spondylosis without myelopathy 03/17/2019   Pseudogout 12/09/2017   Major depressive disorder, recurrent, severe without psychotic features (HCC)    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   Suicide attempt by drug ingestion (HCC)    Vitamin D deficiency 08/22/2014   Hyperlipidemia, mixed 08/22/2014   Hypertension    Anxiety    Abnormal glucose    Class 2 severe obesity due to excess calories with serious comorbidity and body mass index (BMI) of 37.0 to 37.9 in adult [facility])     PCP: Lucky Cowboy MD  REFERRING PROVIDER: Netta Cedars, MD  REFERRING DIAG: S82.852D (ICD-10-CM) - Displaced trimalleolar fracture of left lower leg, subsequent encounter for closed fracture with routine healing   THERAPY DIAG:  Pain in left ankle and joints of left foot  Stiffness of left ankle, not elsewhere classified  Muscle weakness (generalized)  Difficulty in walking, not elsewhere classified  Rationale for Evaluation and Treatment: Rehabilitation  ONSET DATE: July 2024  SUBJECTIVE:   SUBJECTIVE STATEMENT: Pt states she was a bit sore after last session, lasted about a day. HEP going well. States she still isn't using ankle brace much but has used a bit more with the rain the past couple days. Followed up with surgeon earlier this morning, states she was told she is developing plantar fasciitis and brings in an exercise sheet they gave her. Having a bit more pain today which she  attributes in part to weather, busy week, and less sleep.    PERTINENT HISTORY: The patient is s/p left ankle trimalleolar & syndesmosis ORIF, DOS: 06/05/2023.  Removal of deep hardware on 12/03/2023 Hx of foot/ankle surgery in 2022  Fibromyalgia, HTN, PE s/p covid in 2023 LBP with MRI (10/2022) evidence of chronic mild anterior wedge compression fracture of T12 and chronic anterior wedge compression deformity of L1   PAIN:  Are you having pain? Yes: NPRS scale: 5/10 dorsally and along plantar fascia Pain location:  L medial and lateral L ankle Pain description: sharp ache, dull, stabbing   PRECAUTIONS: None wearing ankle brace from [facility] during day.  RED FLAGS: None   WEIGHT BEARING RESTRICTIONS: WBAT  FALLS:  Has patient fallen in last 6 months? Yes. Number of falls 1 fell down wet hill walking dog  LIVING ENVIRONMENT: Lives with: lives alone Lives in: House/apartment Stairs: Yes: External: 3 steps; none Has following equipment at home: Single point cane   OCCUPATION: retired   PLOF: Independent  PATIENT GOALS: walk normally and as pain free as able  NEXT MD VISIT: June 2025  OBJECTIVE:  Note: Objective measures were completed at Evaluation unless otherwise noted. (Patient survey section to Gait section were copied from 01/02/24 progress note)  PATIENT SURVEYS:  LEFS:  30/80   COGNITION: Overall cognitive status: Within functional limits for tasks assessed                           OBSERVATION:            Pt wearing lumbar brace.             Incision has scabbing in mid region.  No drainage.  No signs of infection.            Pt has a cyst on dorsal surface of foot which she states has been there a year.     EDEMA:  Figure 8 measurements:  R:  52.3, L: 56.3     LOWER EXTREMITY ROM:   Active ROM Right eval Left eval Left  11/17/23 Right 2/28 Left 2/28 AROM/PROM  Hip flexion            Hip extension            Hip abduction            Hip  adduction            Hip internal rotation            Hip external rotation            Knee flexion  Knee extension            Ankle dorsiflexion   -8 Lacking 2 14 Lacking 7 deg / 3  with pain  Ankle plantarflexion   30d 42 65 55  Ankle inversion   Limited by 50%   43 24  Ankle eversion   Limited by 50%   10 2 with pain   (Blank rows = not tested)     GAIT: Comments:  Pt ambulated with and without SPC.  Increased toe out on L, decreased toe off, decreased Wb'ing thru L LE, favors L LE with gait.  Pt states she has more pain ambulating without cane.   TODAY'S TREATMENT:  [facility] Adult PT Treatment:                                                DATE: 01/23/24 Therapeutic Exercise: Self ROM of L hallux 2x10 within comfortable ROM, cues for setup  Standing gastroc stretch at counter gently rocking within comfortable ROM x8 Standing soleus stretch at counter, gentle rocking x5 (discontinued due to discomfort)  Ankle pumps x15 Seated calf stretch w/ strap 2x30sec HEP update + education/handout, increased time spent w/ education/discussion on relevant anatomy/physiology and rationale for interventions  Neuromuscular re-ed: NWB isolated hallux extension x15 NWB toe extension (hallux down) x15 Towel scrunch x15 cues for tripod   [facility] Adult PT Treatment:                                                DATE: 01/16/24 Therapeutic Exercise: Ankle pumps 2x12 Ankle circles x8 CCW, x8 CW Ankle ABCs 1 rep (does not require any breaks today) Seated calf stretch w/ strap 2x30sec LLE cues for comfortable ROM HEP discussion/education  Neuromuscular re-ed: Seated NWB toe ext x10 LLE Seated NWB isolated hallux ext 2x10 LLE Seated towel scrunch 2x10 w/ cues for tripod foot and reduced compensations    PATIENT EDUCATION:  Education details: rationale for interventions, HEP  Person educated: Patient Education method: Explanation, Demonstration, Tactile cues, Verbal cues Education  comprehension: verbalized understanding, returned demonstration, verbal cues required, tactile cues required, and needs further education     HOME EXERCISE PROGRAM: Access Code: [email or web address] URL: [email or web address] Date: 01/23/2024 Prepared by: Fransisco Hertz  Exercises - Seated Ankle Alphabet  - 3 x daily - 7 x weekly - 1 sets - 10 reps - Supine Active Straight Leg Raise  - 1 x daily - 6-7 x weekly - 2 sets - 5-10 reps - Long Sitting Calf Stretch with Strap  - 2 x daily - 7 x weekly - 2-3 reps - 20 seconds hold - Seated Heel Raise  - 2 x daily - 7 x weekly - 1 sets - 10 reps - Seated Toe Raise  - 2 x daily - 7 x weekly - 1 sets - 10 reps - Toe Yoga - Alternating Great Toe and Lesser Toe Extension  - 2 x daily - 7 x weekly - 1 sets - 10 reps  ASSESSMENT:  CLINICAL IMPRESSION: Pt arrives w/ 5/10 pain on NPS, some soreness after last session for about a day. She brings in an exercise sheet from her surgeon and we briefly go over it, trial of standing soleus  and gastroc stretches, the latter of which is discontinued due to symptom irritability. She does well with motor control training but does have some intermittent irritation of dorsal foot pain, particularly with hallux extension. Encouraged pt to monitor w/ HEP performance and modify as needed,although she does demonstrate improved performance compared to last session. No adverse events, pt reports 6/10 pain on departure. Recommend continuing along current POC in order to address relevant deficits and improve functional tolerance. Pt departs today's session in no acute distress, all voiced questions/concerns addressed appropriately from PT perspective.      OBJECTIVE IMPAIRMENTS: Abnormal gait, decreased activity tolerance, decreased balance, difficulty walking, decreased ROM, decreased strength, obesity, and pain.   ACTIVITY LIMITATIONS: squatting, stairs, and locomotion level  PARTICIPATION LIMITATIONS: shopping and  community activity  PERSONAL FACTORS: Past/current experiences and 1 comorbidity: back dysfunction  are also affecting patient's functional outcome.   REHAB POTENTIAL: Good  CLINICAL DECISION MAKING: Evolving/moderate complexity  EVALUATION COMPLEXITY: Moderate   GOALS:  SHORT TERM GOALS: Target date: 11/24/23 Pt will tolerate full aquatic sessions consistently without increase in pain and with improving function to demonstrate good toleration and effectiveness of intervention.  Baseline: Goal status: Met 11/24/23  2.  Pt will navigate stair into and out of pool 6 steps using step through pattern Baseline: step-to pattern Goal status: In progress 11/10/23. Partially met 11/24/23  3.  Pt will report reduction in pain by 3 NPRS post aquatic session which lasts for up to 24 hours. Baseline:  Goal status: Met 11/24/23  4.  Pt will amb without antalgic gait pattern submerged in 3.6 ft Baseline:  Goal status:MET - 11/17/23  5.  Pt will demo improved quality of gait including increased toe off and stance time on L LE with reduced favoring of L LE. Goal status: INITIAL Target date:  01/23/2024  6.  Pt will demo improved L ankle AROM to at least 0 deg in DF and 7 deg in Eversion for improved stiffness and gait.  Goal status: INITIAL Target date:  01/23/2024     LONG TERM GOALS: Target date: 02/13/24  Pt will demonstrate an improvement in LEFS by at least 9 points for a clinically significant improvement in self perceived disability.  Baseline: 30/80 Goal status: INITIAL  2.  Pt wiil navigate 1 flight of steps using step alternating pattern indep Baseline:  Goal status: INITIAL  3.  Pt will demonstrate improved DF AROM to be w/n 5 deg of R ankle for improved stiffness, gait, and performance of stairs.  Baseline: see chart Goal status: INITIAL  4.  Pt will demo left ankle strength to be at least 4/5 in DF and WFL in PF (tested with manual resistance in open chain) in order for  improved performance of and tolerance with functional mobility Baseline: see chart Goal status: INITIAL  5.  Pt will amb without AD without limitation to pain Baseline:using cane or walker  Goal status: INITIAL     PLAN:  PT FREQUENCY: 2x/wk  PT DURATION: 6 weeks  PLANNED INTERVENTIONS: 97164- PT Re-evaluation, 97110-Therapeutic exercises, 97530- Therapeutic activity, 97112- Neuromuscular re-education, 97535- Self Care, 16109- Manual therapy, [phone]- Gait training, [phone]- Orthotic Fit/training, [phone]- Aquatic Therapy, Patient/Family education, Balance training, Stair training, Taping, Dry Needling, Joint mobilization, DME instructions, Cryotherapy, and Moist heat  PLAN FOR NEXT SESSION:  Review HEP and update if tolerable.  Cont with ankle ROM.  Gait training. Consider eversion/inversion AROM w/ towel next visit   Ashley Murrain PT, DPT 01/23/2024  11:45 AM

## 2024-01-23 ENCOUNTER — Ambulatory Visit (HOSPITAL_BASED_OUTPATIENT_CLINIC_OR_DEPARTMENT_OTHER): Payer: Medicare HMO | Admitting: Physical Therapy

## 2024-01-23 ENCOUNTER — Encounter (HOSPITAL_BASED_OUTPATIENT_CLINIC_OR_DEPARTMENT_OTHER): Payer: Self-pay | Admitting: Physical Therapy

## 2024-01-23 DIAGNOSIS — M25572 Pain in left ankle and joints of left foot: Secondary | ICD-10-CM

## 2024-01-23 DIAGNOSIS — M6281 Muscle weakness (generalized): Secondary | ICD-10-CM

## 2024-01-23 DIAGNOSIS — R262 Difficulty in walking, not elsewhere classified: Secondary | ICD-10-CM

## 2024-01-23 DIAGNOSIS — S82852D Displaced trimalleolar fracture of left lower leg, subsequent encounter for closed fracture with routine healing: Secondary | ICD-10-CM | POA: Diagnosis not present

## 2024-01-23 DIAGNOSIS — M25672 Stiffness of left ankle, not elsewhere classified: Secondary | ICD-10-CM

## 2024-01-27 ENCOUNTER — Ambulatory Visit (HOSPITAL_BASED_OUTPATIENT_CLINIC_OR_DEPARTMENT_OTHER): Payer: Medicare HMO | Admitting: Physical Therapy

## 2024-01-27 ENCOUNTER — Encounter (HOSPITAL_BASED_OUTPATIENT_CLINIC_OR_DEPARTMENT_OTHER): Payer: Self-pay

## 2024-01-27 DIAGNOSIS — F331 Major depressive disorder, recurrent, moderate: Secondary | ICD-10-CM | POA: Diagnosis not present

## 2024-01-28 NOTE — Therapy (Signed)
 OUTPATIENT PHYSICAL THERAPY LOWER EXTREMITY TREATMENT       Patient Name: Haley White MRN: [id number] DOB:1956-02-16, 68 y.o., female Today's Date: 01/29/2024  END OF SESSION:  PT End of Session - 01/29/24 1101     Visit Number 13    Number of Visits 21    Date for PT Re-Evaluation 02/13/24    Authorization Type aetna mcr    Progress Note Due on Visit 19    PT Start Time 1102    PT Stop Time 1143    PT Time Calculation (min) 41 min    Activity Tolerance Patient tolerated treatment well                   Past Medical History:  Diagnosis Date   Anxiety    Arthritis    Asthma    Cataract    Depression    Gout    Hyperlipidemia    Hypertension    Obese    Sleep apnea    Past Surgical History:  Procedure Laterality Date   ANKLE SURGERY Left    APPENDECTOMY  1984   APPENDECTOMY     CATARACT EXTRACTION W/ INTRAOCULAR LENS  IMPLANT, BILATERAL  2014   CESAREAN SECTION  1984, 1987, 1994   X3   CESAREAN SECTION     x3   COLONOSCOPY  11/16/2013   w/Brodie    EXTERNAL FIXATION LEG Left 06/04/2023   Procedure: EXTERNAL FIXATION LEG;  Surgeon: Netta Cedars, MD;  Location: [facility];  Service: Orthopedics;  Laterality: Left;   EYE SURGERY Bilateral    lasix   FOOT FRACTURE SURGERY Left    FOOT SURGERY Left 2002   FRACTURE SURGERY Left    left ankle   HARDWARE REMOVAL Left 12/03/2023   Procedure: HARDWARE REMOVAL OF SYNDEMOSIS;  Surgeon: Netta Cedars, MD;  Location: [facility];  Service: Orthopedics;  Laterality: Left;   KNEE SURGERY Left 1998   ORIF ANKLE FRACTURE Left 06/04/2023   Procedure: OPEN REDUCTION INTERNAL FIXATION (ORIF) ANKLE FRACTURE vs EX FIX;  Surgeon: Netta Cedars, MD;  Location: [facility];  Service: Orthopedics;  Laterality: Left;   ROTATOR CUFF REPAIR Right 2007   shoulder     torn rotator cuff   TUBAL LIGATION  1994   TUBAL LIGATION     Patient Active Problem List   Diagnosis Date Noted   OSA on CPAP  08/11/2023   Ankle fracture 06/03/2023   Trimalleolar fracture of left ankle 06/02/2023   Acute left ankle pain 06/02/2023   Sleep-related hypoxia 03/24/2023   At risk for obstructive sleep apnea 03/24/2023   Acute pulmonary embolism (HCC) 05/10/2022   Allergic rhinitis 03/15/2021   Gastro-esophageal reflux disease without esophagitis 03/15/2021   Mild persistent asthma, uncomplicated 03/15/2021   Aortic atherosclerosis (HCC) by Chest CT scan on 02/02/2018  01/10/2021   Asthma    Low back pain 03/17/2019   Lumbosacral spondylosis without myelopathy 03/17/2019   Pseudogout 12/09/2017   Major depressive disorder, recurrent, severe without psychotic features (HCC)    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   Suicide attempt by drug ingestion (HCC)    Vitamin D deficiency 08/22/2014   Hyperlipidemia, mixed 08/22/2014   Hypertension    Anxiety    Abnormal glucose    Class 2 severe obesity due to excess calories with serious comorbidity and body mass index (BMI) of 37.0 to 37.9 in adult [facility])     PCP: Lucky Cowboy MD  REFERRING PROVIDER: Netta Cedars,  MD   REFERRING DIAG: S82.852D (ICD-10-CM) - Displaced trimalleolar fracture of left lower leg, subsequent encounter for closed fracture with routine healing   THERAPY DIAG:  Pain in left ankle and joints of left foot  Stiffness of left ankle, not elsewhere classified  Muscle weakness (generalized)  Difficulty in walking, not elsewhere classified  Rationale for Evaluation and Treatment: Rehabilitation  ONSET DATE: July 2024  SUBJECTIVE:   SUBJECTIVE STATEMENT: Pt states she did well after last session. She notes steroids seemed to help her back a bit. Foot doing okay today, 2/10 dorsally and in ankle.    PERTINENT HISTORY: The patient is s/p left ankle trimalleolar & syndesmosis ORIF, DOS: 06/05/2023.  Removal of deep hardware on 12/03/2023 Hx of foot/ankle surgery in 2022  Fibromyalgia, HTN, PE s/p covid in  2023 LBP with MRI (10/2022) evidence of chronic mild anterior wedge compression fracture of T12 and chronic anterior wedge compression deformity of L1   PAIN:  Are you having pain? Yes: NPRS scale: 2/10 dorsal foot and ankles Pain location:  L medial and lateral L ankle Pain description: sharp ache, dull, stabbing   PRECAUTIONS: None wearing ankle brace from [facility] during day.  RED FLAGS: None   WEIGHT BEARING RESTRICTIONS: WBAT  FALLS:  Has patient fallen in last 6 months? Yes. Number of falls 1 fell down wet hill walking dog  LIVING ENVIRONMENT: Lives with: lives alone Lives in: House/apartment Stairs: Yes: External: 3 steps; none Has following equipment at home: Single point cane   OCCUPATION: retired   PLOF: Independent  PATIENT GOALS: walk normally and as pain free as able  NEXT MD VISIT: June 2025  OBJECTIVE:  Note: Objective measures were completed at Evaluation unless otherwise noted. (Patient survey section to Gait section were copied from 01/02/24 progress note)  PATIENT SURVEYS:  LEFS:  30/80   COGNITION: Overall cognitive status: Within functional limits for tasks assessed                           OBSERVATION:            Pt wearing lumbar brace.             Incision has scabbing in mid region.  No drainage.  No signs of infection.            Pt has a cyst on dorsal surface of foot which she states has been there a year.     EDEMA:  Figure 8 measurements:  R:  52.3, L: 56.3     LOWER EXTREMITY ROM:   Active ROM Right eval Left eval Left  11/17/23 Right 2/28 Left 2/28 AROM/PROM  Hip flexion            Hip extension            Hip abduction            Hip adduction            Hip internal rotation            Hip external rotation            Knee flexion            Knee extension            Ankle dorsiflexion   -8 Lacking 2 14 Lacking 7 deg / 3  with pain  Ankle plantarflexion   30d 42 65 55  Ankle inversion  Limited by 50%   43  24  Ankle eversion   Limited by 50%   10 2 with pain   (Blank rows = not tested)     GAIT: Comments:  Pt ambulated with and without SPC.  Increased toe out on L, decreased toe off, decreased Wb'ing thru L LE, favors L LE with gait.  Pt states she has more pain ambulating without cane.   TODAY'S TREATMENT:  [facility] Adult PT Treatment:                                                DATE: 01/29/24 Therapeutic Exercise: Seated heel/toe raises 3x10 Standing soleus stretch 3x30sec w/ UE support cues for form and comfortable ROM Standing gastroc stretch 3x30sec w/ UE support cues for comfortable ROM HEP discussion/education Increased time w/ exercises to discuss relevant anatomy/physiology and rationale for interventions, observed compensations  Neuromuscular re-ed: Wobbleboard PF/DF 2x12 for ankle stability cues to reduce toe ext compensations with DF Deflated ball DF/PF rollout x15 cues for pacing and form    [facility] Adult PT Treatment:                                                DATE: 01/23/24 Therapeutic Exercise: Self ROM of L hallux 2x10 within comfortable ROM, cues for setup  Standing gastroc stretch at counter gently rocking within comfortable ROM x8 Standing soleus stretch at counter, gentle rocking x5 (discontinued due to discomfort)  Ankle pumps x15 Seated calf stretch w/ strap 2x30sec HEP update + education/handout, increased time spent w/ education/discussion on relevant anatomy/physiology and rationale for interventions  Neuromuscular re-ed: NWB isolated hallux extension x15 NWB toe extension (hallux down) x15 Towel scrunch x15 cues for tripod   [facility] Adult PT Treatment:                                                DATE: 01/16/24 Therapeutic Exercise: Ankle pumps 2x12 Ankle circles x8 CCW, x8 CW Ankle ABCs 1 rep (does not require any breaks today) Seated calf stretch w/ strap 2x30sec LLE cues for comfortable ROM HEP discussion/education  Neuromuscular re-ed: Seated  NWB toe ext x10 LLE Seated NWB isolated hallux ext 2x10 LLE Seated towel scrunch 2x10 w/ cues for tripod foot and reduced compensations    PATIENT EDUCATION:  Education details: rationale for interventions, HEP  Person educated: Patient Education method: Explanation, Demonstration, Tactile cues, Verbal cues Education comprehension: verbalized understanding, returned demonstration, verbal cues required, tactile cues required, and needs further education     HOME EXERCISE PROGRAM: Access Code: [email or web address] URL: [email or web address] Date: 01/23/2024 Prepared by: Fransisco Hertz  Exercises - Seated Ankle Alphabet  - 3 x daily - 7 x weekly - 1 sets - 10 reps - Supine Active Straight Leg Raise  - 1 x daily - 6-7 x weekly - 2 sets - 5-10 reps - Long Sitting Calf Stretch with Strap  - 2 x daily - 7 x weekly - 2-3 reps - 20 seconds hold - Seated Heel Raise  - 2 x daily - 7 x weekly - 1 sets -  10 reps - Seated Toe Raise  - 2 x daily - 7 x weekly - 1 sets - 10 reps - Toe Yoga - Alternating Great Toe and Lesser Toe Extension  - 2 x daily - 7 x weekly - 1 sets - 10 reps  ASSESSMENT:  CLINICAL IMPRESSION: Pt arrives w/ 2/10 pain on NPS, no issues after last session. Today continuing to work on foot and ankle active mobility, intrinsic/extrinsic stability. Noted tendency to compensate in frontal plane for PF/DF, initial difficulty correcting but improves with repetition. Also working on minimizing toe compensations with DF. No adverse events, tolerates session well overall without increase in resting pain, reports improved stiffness on departure. Recommend continuing along current POC in order to address relevant deficits and improve functional tolerance. Pt departs today's session in no acute distress, all voiced questions/concerns addressed appropriately from PT perspective.     OBJECTIVE IMPAIRMENTS: Abnormal gait, decreased activity tolerance, decreased balance, difficulty walking,  decreased ROM, decreased strength, obesity, and pain.   ACTIVITY LIMITATIONS: squatting, stairs, and locomotion level  PARTICIPATION LIMITATIONS: shopping and community activity  PERSONAL FACTORS: Past/current experiences and 1 comorbidity: back dysfunction  are also affecting patient's functional outcome.   REHAB POTENTIAL: Good  CLINICAL DECISION MAKING: Evolving/moderate complexity  EVALUATION COMPLEXITY: Moderate   GOALS:  SHORT TERM GOALS: Target date: 11/24/23 Pt will tolerate full aquatic sessions consistently without increase in pain and with improving function to demonstrate good toleration and effectiveness of intervention.  Baseline: Goal status: Met 11/24/23  2.  Pt will navigate stair into and out of pool 6 steps using step through pattern Baseline: step-to pattern Goal status: In progress 11/10/23. Partially met 11/24/23  3.  Pt will report reduction in pain by 3 NPRS post aquatic session which lasts for up to 24 hours. Baseline:  Goal status: Met 11/24/23  4.  Pt will amb without antalgic gait pattern submerged in 3.6 ft Baseline:  Goal status:MET - 11/17/23  5.  Pt will demo improved quality of gait including increased toe off and stance time on L LE with reduced favoring of L LE. Goal status: INITIAL Target date:  01/23/2024  6.  Pt will demo improved L ankle AROM to at least 0 deg in DF and 7 deg in Eversion for improved stiffness and gait.  Goal status: INITIAL Target date:  01/23/2024     LONG TERM GOALS: Target date: 02/13/24  Pt will demonstrate an improvement in LEFS by at least 9 points for a clinically significant improvement in self perceived disability.  Baseline: 30/80 Goal status: INITIAL  2.  Pt wiil navigate 1 flight of steps using step alternating pattern indep Baseline:  Goal status: INITIAL  3.  Pt will demonstrate improved DF AROM to be w/n 5 deg of R ankle for improved stiffness, gait, and performance of stairs.  Baseline: see  chart Goal status: INITIAL  4.  Pt will demo left ankle strength to be at least 4/5 in DF and WFL in PF (tested with manual resistance in open chain) in order for improved performance of and tolerance with functional mobility Baseline: see chart Goal status: INITIAL  5.  Pt will amb without AD without limitation to pain Baseline:using cane or walker  Goal status: INITIAL     PLAN:  PT FREQUENCY: 2x/wk  PT DURATION: 6 weeks  PLANNED INTERVENTIONS: 97164- PT Re-evaluation, 97110-Therapeutic exercises, 97530- Therapeutic activity, [id number]- Neuromuscular re-education, 97535- Self Care, [id number]- Manual therapy, [id number]- Gait training, [phone]- Orthotic Fit/training, [phone]- Aquatic  Therapy, Patient/Family education, Balance training, Stair training, Taping, Dry Needling, Joint mobilization, DME instructions, Cryotherapy, and Moist heat  PLAN FOR NEXT SESSION:  Review HEP and update if tolerable.  Cont with ankle ROM.  Gait training. Ankle stability training   Ashley Murrain PT, DPT 01/29/2024 11:45 AM

## 2024-01-29 ENCOUNTER — Ambulatory Visit (HOSPITAL_BASED_OUTPATIENT_CLINIC_OR_DEPARTMENT_OTHER): Payer: Medicare HMO | Admitting: Physical Therapy

## 2024-01-29 ENCOUNTER — Encounter (HOSPITAL_BASED_OUTPATIENT_CLINIC_OR_DEPARTMENT_OTHER): Payer: Self-pay | Admitting: Physical Therapy

## 2024-01-29 DIAGNOSIS — M6281 Muscle weakness (generalized): Secondary | ICD-10-CM | POA: Diagnosis not present

## 2024-01-29 DIAGNOSIS — M25572 Pain in left ankle and joints of left foot: Secondary | ICD-10-CM

## 2024-01-29 DIAGNOSIS — R262 Difficulty in walking, not elsewhere classified: Secondary | ICD-10-CM

## 2024-01-29 DIAGNOSIS — M25672 Stiffness of left ankle, not elsewhere classified: Secondary | ICD-10-CM | POA: Diagnosis not present

## 2024-02-03 ENCOUNTER — Ambulatory Visit (HOSPITAL_BASED_OUTPATIENT_CLINIC_OR_DEPARTMENT_OTHER): Payer: Medicare HMO | Attending: Orthopaedic Surgery | Admitting: Physical Therapy

## 2024-02-03 DIAGNOSIS — R2689 Other abnormalities of gait and mobility: Secondary | ICD-10-CM | POA: Insufficient documentation

## 2024-02-03 DIAGNOSIS — R262 Difficulty in walking, not elsewhere classified: Secondary | ICD-10-CM | POA: Diagnosis not present

## 2024-02-03 DIAGNOSIS — M5459 Other low back pain: Secondary | ICD-10-CM | POA: Diagnosis not present

## 2024-02-03 DIAGNOSIS — M25672 Stiffness of left ankle, not elsewhere classified: Secondary | ICD-10-CM | POA: Insufficient documentation

## 2024-02-03 DIAGNOSIS — S82852D Displaced trimalleolar fracture of left lower leg, subsequent encounter for closed fracture with routine healing: Secondary | ICD-10-CM | POA: Diagnosis not present

## 2024-02-03 DIAGNOSIS — R293 Abnormal posture: Secondary | ICD-10-CM | POA: Diagnosis not present

## 2024-02-03 DIAGNOSIS — M6281 Muscle weakness (generalized): Secondary | ICD-10-CM | POA: Diagnosis not present

## 2024-02-03 DIAGNOSIS — M25572 Pain in left ankle and joints of left foot: Secondary | ICD-10-CM | POA: Diagnosis not present

## 2024-02-03 NOTE — Therapy (Signed)
 OUTPATIENT PHYSICAL THERAPY LOWER EXTREMITY TREATMENT       Patient Name: Haley White MRN: [id number] DOB:07-28-1956, 68 y.o., female Today's Date: 02/04/2024  END OF SESSION:  PT End of Session - 02/03/24 1109     Visit Number 14    Number of Visits 21    Date for PT Re-Evaluation 02/13/24    Authorization Type [employer]etna mcr    PT Start Time 1109    PT Stop Time 1153    PT Time Calculation (min) 44 min    Activity Tolerance Patient tolerated treatment well    Behavior During Therapy WFL for tasks assessed/performed                   Past Medical History:  Diagnosis Date   Anxiety    Arthritis    Asthma    Cataract    Depression    Gout    Hyperlipidemia    Hypertension    Obese    Sleep apnea    Past Surgical History:  Procedure Laterality Date   ANKLE SURGERY Left    APPENDECTOMY  1984   APPENDECTOMY     CATARACT EXTRACTION W/ INTRAOCULAR LENS  IMPLANT, BILATERAL  2014   CESAREAN SECTION  1984, 1987, 1994   X3   CESAREAN SECTION     x3   COLONOSCOPY  11/16/2013   w/Brodie    EXTERNAL FIXATION LEG Left 06/04/2023   Procedure: EXTERNAL FIXATION LEG;  Surgeon: Netta Cedars, MD;  Location: [facility];  Service: Orthopedics;  Laterality: Left;   EYE SURGERY Bilateral    lasix   FOOT FRACTURE SURGERY Left    FOOT SURGERY Left 2002   FRACTURE SURGERY Left    left ankle   HARDWARE REMOVAL Left 12/03/2023   Procedure: HARDWARE REMOVAL OF SYNDEMOSIS;  Surgeon: Netta Cedars, MD;  Location: [facility];  Service: Orthopedics;  Laterality: Left;   KNEE SURGERY Left 1998   ORIF ANKLE FRACTURE Left 06/04/2023   Procedure: OPEN REDUCTION INTERNAL FIXATION (ORIF) ANKLE FRACTURE vs EX FIX;  Surgeon: Netta Cedars, MD;  Location: [facility];  Service: Orthopedics;  Laterality: Left;   ROTATOR CUFF REPAIR Right 2007   shoulder     torn rotator cuff   TUBAL LIGATION  1994   TUBAL LIGATION     Patient Active Problem List   Diagnosis Date  Noted   OSA on CPAP 08/11/2023   Ankle fracture 06/03/2023   Trimalleolar fracture of left ankle 06/02/2023   Acute left ankle pain 06/02/2023   Sleep-related hypoxia 03/24/2023   At risk for obstructive sleep apnea 03/24/2023   Acute pulmonary embolism (HCC) 05/10/2022   Allergic rhinitis 03/15/2021   Gastro-esophageal reflux disease without esophagitis 03/15/2021   Mild persistent asthma, uncomplicated 03/15/2021   Aortic atherosclerosis (HCC) by Chest CT scan on 02/02/2018  01/10/2021   Asthma    Low back pain 03/17/2019   Lumbosacral spondylosis without myelopathy 03/17/2019   Pseudogout 12/09/2017   Major depressive disorder, recurrent, severe without psychotic features (HCC)    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   Suicide attempt by drug ingestion (HCC)    Vitamin D deficiency 08/22/2014   Hyperlipidemia, mixed 08/22/2014   Hypertension    Anxiety    Abnormal glucose    Class 2 severe obesity due to excess calories with serious comorbidity and body mass index (BMI) of 37.0 to 37.9 in adult [facility])     PCP: Lucky Cowboy MD  REFERRING PROVIDER: Odis Hollingshead,  Ayesha Rumpf, MD   REFERRING DIAG: S82.852D (ICD-10-CM) - Displaced trimalleolar fracture of left lower leg, subsequent encounter for closed fracture with routine healing   THERAPY DIAG:  Pain in left ankle and joints of left foot  Stiffness of left ankle, not elsewhere classified  Muscle weakness (generalized)  Difficulty in walking, not elsewhere classified  Rationale for Evaluation and Treatment: Rehabilitation  ONSET DATE: July 2024  SUBJECTIVE:   SUBJECTIVE STATEMENT: Pt is 8 weeks and 6 days s/p hardware removal.  Pt denies any adverse effects after prior Rx.  Pt states her back is bothering her more than her foot/ankle.  She reports the 2nd ablation didn't help.  Pt states the steroids really helped.  Pt reports compliance with HEP.  Pt saw MD on 3/21 and had x rays.  Pt states MD was pleased with x rays.   She returns to MD in 2 months.  Pt states she hasn't been using the cane recently.  Pt has difficulty descending stairs and descends stairs sideways. Pt wearing ankle brace occasionally if it's hurting.      PERTINENT HISTORY: The patient is s/p left ankle trimalleolar & syndesmosis ORIF, DOS: 06/05/2023.  Removal of deep hardware on 12/03/2023 Hx of foot/ankle surgery in 2022  Fibromyalgia, HTN, PE s/p covid in 2023 LBP with MRI (10/2022) evidence of chronic mild anterior wedge compression fracture of T12 and chronic anterior wedge compression deformity of L1   PAIN:  Are you having pain? Yes: NPRS scale: 2/10  Pain location:  L lateral ankle Pain description: dull   PRECAUTIONS: None   RED FLAGS: None   WEIGHT BEARING RESTRICTIONS: WBAT  FALLS:  Has patient fallen in last 6 months? Yes. Number of falls 1 fell down wet hill walking dog  LIVING ENVIRONMENT: Lives with: lives alone Lives in: House/apartment Stairs: Yes: External: 3 steps; none Has following equipment at home: Single point cane   OCCUPATION: retired   PLOF: Independent  PATIENT GOALS: walk normally and as pain free as able  NEXT MD VISIT: June 2025  OBJECTIVE:  Note: Objective measures were completed at Evaluation unless otherwise noted. (Patient survey section to Gait section were copied from 01/02/24 progress note)    LOWER EXTREMITY ROM:   Active ROM Right eval Left eval Left  11/17/23 Right 2/28 Left 2/28 AROM/PROM Left 4/1  Hip flexion             Hip extension             Hip abduction             Hip adduction             Hip internal rotation             Hip external rotation             Knee flexion             Knee extension             Ankle dorsiflexion   -8 Lacking 2 14 Lacking 7 deg / 3  with pain 4 deg  Ankle plantarflexion   30d 42 65 55   Ankle inversion   Limited by 50%   43 24   Ankle eversion   Limited by 50%   10 2 with pain    (Blank rows = not tested)      TODAY'S TREATMENT:  02/03/24 Reviewed pt presentation, HEP compliance, pain levels, and response to prior Rx.  Pt received L ankle DF, PF, Inv, and Eve PROM.  BAPS board 2x10 up/down, cw, and ccw Seated heel raises and toe raises 2x10 each Toe yoga x 10 reps Towel scrunches  Standing gastroc stretch at counter 2x30 sec Standing soleus stretch at counter approx 20 sec Sit to stands from table x10   [facility] Adult PT Treatment:                                                DATE: 01/29/24 Therapeutic Exercise: Seated heel/toe raises 3x10 Standing soleus stretch 3x30sec w/ UE support cues for form and comfortable ROM Standing gastroc stretch 3x30sec w/ UE support cues for comfortable ROM HEP discussion/education Increased time w/ exercises to discuss relevant anatomy/physiology and rationale for interventions, observed compensations  Neuromuscular re-ed: Wobbleboard PF/DF 2x12 for ankle stability cues to reduce toe ext compensations with DF Deflated ball DF/PF rollout x15 cues for pacing and form    [facility] Adult PT Treatment:                                                DATE: 01/23/24 Therapeutic Exercise: Self ROM of L hallux 2x10 within comfortable ROM, cues for setup  Standing gastroc stretch at counter gently rocking within comfortable ROM x8 Standing soleus stretch at counter, gentle rocking x5 (discontinued due to discomfort)  Ankle pumps x15 Seated calf stretch w/ strap 2x30sec HEP update + education/handout, increased time spent w/ education/discussion on relevant anatomy/physiology and rationale for interventions  Neuromuscular re-ed: NWB isolated hallux extension x15 NWB toe extension (hallux down) x15 Towel scrunch x15 cues for tripod   [facility] Adult PT Treatment:                                                DATE: 01/16/24 Therapeutic Exercise: Ankle pumps 2x12 Ankle circles x8 CCW, x8 CW Ankle ABCs 1 rep (does not require any breaks today) Seated calf stretch w/  strap 2x30sec LLE cues for comfortable ROM HEP discussion/education  Neuromuscular re-ed: Seated NWB toe ext x10 LLE Seated NWB isolated hallux ext 2x10 LLE Seated towel scrunch 2x10 w/ cues for tripod foot and reduced compensations    PATIENT EDUCATION:  Education details: rationale for interventions, HEP  Person educated: Patient Education method: Explanation, Demonstration, Tactile cues, Verbal cues Education comprehension: verbalized understanding, returned demonstration, verbal cues required, tactile cues required, and needs further education     HOME EXERCISE PROGRAM: Access Code: [email or web address] URL: [email or web address] Date: 01/23/2024 Prepared by: Fransisco Hertz  Exercises - Seated Ankle Alphabet  - 3 x daily - 7 x weekly - 1 sets - 10 reps - Supine Active Straight Leg Raise  - 1 x daily - 6-7 x weekly - 2 sets - 5-10 reps - Long Sitting Calf Stretch with Strap  - 2 x daily - 7 x weekly - 2-3 reps - 20 seconds hold - Seated Heel Raise  - 2 x daily - 7 x weekly - 1 sets - 10 reps - Seated Toe Raise  - 2 x daily - 7 x weekly - 1 sets -  10 reps - Toe Yoga - Alternating Great Toe and Lesser Toe Extension  - 2 x daily - 7 x weekly - 1 sets - 10 reps  ASSESSMENT:  CLINICAL IMPRESSION: Pt presents to Rx without cane and states she has not been using cane recently.  Pt demonstrates improved L ankle DF AROM as evidenced by goniometric measurements.  Pt reports her back is bothering her more than her ankle.  Pt performed exercises well with cuing and instruction in correct form.  Pt had pain with standing soleus stretch and PT has pt stop that exercise.  She reports increased pain to 3-4/10 after Rx.  Pt should benefit from cont skilled PT to address impairments and goals and to assist in restoring desired level of function.    OBJECTIVE IMPAIRMENTS: Abnormal gait, decreased activity tolerance, decreased balance, difficulty walking, decreased ROM, decreased strength,  obesity, and pain.   ACTIVITY LIMITATIONS: squatting, stairs, and locomotion level  PARTICIPATION LIMITATIONS: shopping and community activity  PERSONAL FACTORS: Past/current experiences and 1 comorbidity: back dysfunction  are also affecting patient's functional outcome.   REHAB POTENTIAL: Good  CLINICAL DECISION MAKING: Evolving/moderate complexity  EVALUATION COMPLEXITY: Moderate   GOALS:  SHORT TERM GOALS: Target date: 11/24/23 Pt will tolerate full aquatic sessions consistently without increase in pain and with improving function to demonstrate good toleration and effectiveness of intervention.  Baseline: Goal status: Met 11/24/23  2.  Pt will navigate stair into and out of pool 6 steps using step through pattern Baseline: step-to pattern Goal status: In progress 11/10/23. Partially met 11/24/23  3.  Pt will report reduction in pain by 3 NPRS post aquatic session which lasts for up to 24 hours. Baseline:  Goal status: Met 11/24/23  4.  Pt will amb without antalgic gait pattern submerged in 3.6 ft Baseline:  Goal status:MET - 11/17/23  5.  Pt will demo improved quality of gait including increased toe off and stance time on L LE with reduced favoring of L LE. Goal status: INITIAL Target date:  01/23/2024  6.  Pt will demo improved L ankle AROM to at least 0 deg in DF and 7 deg in Eversion for improved stiffness and gait.  Goal status: INITIAL Target date:  01/23/2024     LONG TERM GOALS: Target date: 02/13/24  Pt will demonstrate an improvement in LEFS by at least 9 points for a clinically significant improvement in self perceived disability.  Baseline: 30/80 Goal status: INITIAL  2.  Pt wiil navigate 1 flight of steps using step alternating pattern indep Baseline:  Goal status: INITIAL  3.  Pt will demonstrate improved DF AROM to be w/n 5 deg of R ankle for improved stiffness, gait, and performance of stairs.  Baseline: see chart Goal status: INITIAL  4.  Pt will  demo left ankle strength to be at least 4/5 in DF and WFL in PF (tested with manual resistance in open chain) in order for improved performance of and tolerance with functional mobility Baseline: see chart Goal status: INITIAL  5.  Pt will amb without AD without limitation to pain Baseline:using cane or walker  Goal status: INITIAL     PLAN:  PT FREQUENCY: 2x/wk  PT DURATION: 6 weeks  PLANNED INTERVENTIONS: 97164- PT Re-evaluation, 97110-Therapeutic exercises, 97530- Therapeutic activity, 97112- Neuromuscular re-education, 97535- Self Care, [id number]- Manual therapy, [phone]- Gait training, [phone]- Orthotic Fit/training, [phone]- Aquatic Therapy, Patient/Family education, Balance training, Stair training, Taping, Dry Needling, Joint mobilization, DME instructions, Cryotherapy, and Moist heat  PLAN FOR NEXT SESSION:  Review HEP and update if tolerable.  Cont with ankle ROM.  Gait training. Ankle stability training   Audie Clear III PT, DPT 02/04/24 2:08 PM

## 2024-02-04 ENCOUNTER — Encounter (HOSPITAL_BASED_OUTPATIENT_CLINIC_OR_DEPARTMENT_OTHER): Payer: Self-pay | Admitting: Physical Therapy

## 2024-02-05 ENCOUNTER — Ambulatory Visit (HOSPITAL_BASED_OUTPATIENT_CLINIC_OR_DEPARTMENT_OTHER): Payer: Medicare HMO | Admitting: Physical Therapy

## 2024-02-05 ENCOUNTER — Encounter (HOSPITAL_BASED_OUTPATIENT_CLINIC_OR_DEPARTMENT_OTHER): Payer: Self-pay | Admitting: Physical Therapy

## 2024-02-05 DIAGNOSIS — M25572 Pain in left ankle and joints of left foot: Secondary | ICD-10-CM

## 2024-02-05 DIAGNOSIS — R293 Abnormal posture: Secondary | ICD-10-CM | POA: Diagnosis not present

## 2024-02-05 DIAGNOSIS — M6281 Muscle weakness (generalized): Secondary | ICD-10-CM | POA: Diagnosis not present

## 2024-02-05 DIAGNOSIS — R262 Difficulty in walking, not elsewhere classified: Secondary | ICD-10-CM

## 2024-02-05 DIAGNOSIS — M5459 Other low back pain: Secondary | ICD-10-CM | POA: Diagnosis not present

## 2024-02-05 DIAGNOSIS — R2689 Other abnormalities of gait and mobility: Secondary | ICD-10-CM | POA: Diagnosis not present

## 2024-02-05 DIAGNOSIS — S82852D Displaced trimalleolar fracture of left lower leg, subsequent encounter for closed fracture with routine healing: Secondary | ICD-10-CM | POA: Diagnosis not present

## 2024-02-05 DIAGNOSIS — M25672 Stiffness of left ankle, not elsewhere classified: Secondary | ICD-10-CM

## 2024-02-05 NOTE — Therapy (Signed)
 OUTPATIENT PHYSICAL THERAPY LOWER EXTREMITY TREATMENT       Patient Name: ZOEJANE GAULIN MRN: [id number] DOB:1955-12-04, 68 y.o., female Today's Date: 02/05/2024  END OF SESSION:  PT End of Session - 02/05/24 1017     Visit Number 15    Number of Visits 21    Date for PT Re-Evaluation 02/13/24    Authorization Type [employer] mcr    Progress Note Due on Visit 19    PT Start Time 1017    PT Stop Time 1100    PT Time Calculation (min) 43 min    Activity Tolerance Patient tolerated treatment well    Behavior During Therapy WFL for tasks assessed/performed                    Past Medical History:  Diagnosis Date   Anxiety    Arthritis    Asthma    Cataract    Depression    Gout    Hyperlipidemia    Hypertension    Obese    Sleep apnea    Past Surgical History:  Procedure Laterality Date   ANKLE SURGERY Left    APPENDECTOMY  1984   APPENDECTOMY     CATARACT EXTRACTION W/ INTRAOCULAR LENS  IMPLANT, BILATERAL  2014   CESAREAN SECTION  1984, 1987, 1994   X3   CESAREAN SECTION     x3   COLONOSCOPY  11/16/2013   w/Brodie    EXTERNAL FIXATION LEG Left 06/04/2023   Procedure: EXTERNAL FIXATION LEG;  Surgeon: Netta Cedars, MD;  Location: [facility];  Service: Orthopedics;  Laterality: Left;   EYE SURGERY Bilateral    lasix   FOOT FRACTURE SURGERY Left    FOOT SURGERY Left 2002   FRACTURE SURGERY Left    left ankle   HARDWARE REMOVAL Left 12/03/2023   Procedure: HARDWARE REMOVAL OF SYNDEMOSIS;  Surgeon: Netta Cedars, MD;  Location: [facility];  Service: Orthopedics;  Laterality: Left;   KNEE SURGERY Left 1998   ORIF ANKLE FRACTURE Left 06/04/2023   Procedure: OPEN REDUCTION INTERNAL FIXATION (ORIF) ANKLE FRACTURE vs EX FIX;  Surgeon: Netta Cedars, MD;  Location: [facility];  Service: Orthopedics;  Laterality: Left;   ROTATOR CUFF REPAIR Right 2007   shoulder     torn rotator cuff   TUBAL LIGATION  1994   TUBAL LIGATION     Patient  Active Problem List   Diagnosis Date Noted   OSA on CPAP 08/11/2023   Ankle fracture 06/03/2023   Trimalleolar fracture of left ankle 06/02/2023   Acute left ankle pain 06/02/2023   Sleep-related hypoxia 03/24/2023   At risk for obstructive sleep apnea 03/24/2023   Acute pulmonary embolism (HCC) 05/10/2022   Allergic rhinitis 03/15/2021   Gastro-esophageal reflux disease without esophagitis 03/15/2021   Mild persistent asthma, uncomplicated 03/15/2021   Aortic atherosclerosis (HCC) by Chest CT scan on 02/02/2018  01/10/2021   Asthma    Low back pain 03/17/2019   Lumbosacral spondylosis without myelopathy 03/17/2019   Pseudogout 12/09/2017   Major depressive disorder, recurrent, severe without psychotic features (HCC)    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   Suicide attempt by drug ingestion (HCC)    Vitamin D deficiency 08/22/2014   Hyperlipidemia, mixed 08/22/2014   Hypertension    Anxiety    Abnormal glucose    Class 2 severe obesity due to excess calories with serious comorbidity and body mass index (BMI) of 37.0 to 37.9 in adult [facility])  PCP: Lucky Cowboy MD  REFERRING PROVIDER: Netta Cedars, MD   REFERRING DIAG: [phone] (ICD-10-CM) - Displaced trimalleolar fracture of left lower leg, subsequent encounter for closed fracture with routine healing   THERAPY DIAG:  Pain in left ankle and joints of left foot  Stiffness of left ankle, not elsewhere classified  Muscle weakness (generalized)  Difficulty in walking, not elsewhere classified  Rationale for Evaluation and Treatment: Rehabilitation  ONSET DATE: July 2024  SUBJECTIVE:   SUBJECTIVE STATEMENT: Feeling a bit rougher today which she attributes to weather and travelling to [facility] yesterday. Felt good after last session. Most of her pain is dorsal foot, less plantar pain but is pretty tight. States dorsal pain is about the same    PERTINENT HISTORY: The patient is s/p left ankle trimalleolar &  syndesmosis ORIF, DOS: 06/05/2023.  Removal of deep hardware on 12/03/2023 Hx of foot/ankle surgery in 2022  Fibromyalgia, HTN, PE s/p covid in 2023 LBP with MRI (10/2022) evidence of chronic mild anterior wedge compression fracture of T12 and chronic anterior wedge compression deformity of L1   PAIN:  Are you having pain? Yes: NPRS scale: 5-6/10 ankle Pain location:  L lateral ankle Pain description: dull   PRECAUTIONS: None   RED FLAGS: None   WEIGHT BEARING RESTRICTIONS: WBAT  FALLS:  Has patient fallen in last 6 months? Yes. Number of falls 1 fell down wet hill walking dog  LIVING ENVIRONMENT: Lives with: lives alone Lives in: House/apartment Stairs: Yes: External: 3 steps; none Has following equipment at home: Single point cane   OCCUPATION: retired   PLOF: Independent  PATIENT GOALS: walk normally and as pain free as able  NEXT MD VISIT: June 2025  OBJECTIVE:  Note: Objective measures were completed at Evaluation unless otherwise noted. (Patient survey section to Gait section were copied from 01/02/24 progress note)    LOWER EXTREMITY ROM:   Active ROM Right eval Left eval Left  11/17/23 Right 2/28 Left 2/28 AROM/PROM Left 4/1  Hip flexion             Hip extension             Hip abduction             Hip adduction             Hip internal rotation             Hip external rotation             Knee flexion             Knee extension             Ankle dorsiflexion   -8 Lacking 2 14 Lacking 7 deg / 3  with pain 4 deg  Ankle plantarflexion   30d 42 65 55   Ankle inversion   Limited by 50%   43 24   Ankle eversion   Limited by 50%   10 2 with pain    (Blank rows = not tested)     TODAY'S TREATMENT:  [facility] Adult PT Treatment:                                                DATE: 02/05/24 Therapeutic Exercise: Seated heel/toe raises 2x12 (modified to just heel raises after 5 reps due to dorsal discomfort) Yellow band hallux  ext 3x5 rest breaks to  mitigate fatigue  Neuromuscular re-ed: Wobbleboard DF emphasis on minimizing toe compensations 2x12 Airex weight shifts 2x6 w UE support   Self Care: Continued education re: relevant anatomy/physiology and rationale for interventions as pertains to symptom behavior, gradual progression of activity w/ monitoring for symptoms and appropriate modifications as needed   02/03/24 Reviewed pt presentation, HEP compliance, pain levels, and response to prior Rx.  Pt received L ankle DF, PF, Inv, and Eve PROM.  BAPS board 2x10 up/down, cw, and ccw Seated heel raises and toe raises 2x10 each Toe yoga x 10 reps Towel scrunches  Standing gastroc stretch at counter 2x30 sec Standing soleus stretch at counter approx 20 sec Sit to stands from table x10   [facility] Adult PT Treatment:                                                DATE: 01/29/24 Therapeutic Exercise: Seated heel/toe raises 3x10 Standing soleus stretch 3x30sec w/ UE support cues for form and comfortable ROM Standing gastroc stretch 3x30sec w/ UE support cues for comfortable ROM HEP discussion/education Increased time w/ exercises to discuss relevant anatomy/physiology and rationale for interventions, observed compensations  Neuromuscular re-ed: Wobbleboard PF/DF 2x12 for ankle stability cues to reduce toe ext compensations with DF Deflated ball DF/PF rollout x15 cues for pacing and form    [facility] Adult PT Treatment:                                                DATE: 01/23/24 Therapeutic Exercise: Self ROM of L hallux 2x10 within comfortable ROM, cues for setup  Standing gastroc stretch at counter gently rocking within comfortable ROM x8 Standing soleus stretch at counter, gentle rocking x5 (discontinued due to discomfort)  Ankle pumps x15 Seated calf stretch w/ strap 2x30sec HEP update + education/handout, increased time spent w/ education/discussion on relevant anatomy/physiology and rationale for interventions  Neuromuscular  re-ed: NWB isolated hallux extension x15 NWB toe extension (hallux down) x15 Towel scrunch x15 cues for tripod   [facility] Adult PT Treatment:                                                DATE: 01/16/24 Therapeutic Exercise: Ankle pumps 2x12 Ankle circles x8 CCW, x8 CW Ankle ABCs 1 rep (does not require any breaks today) Seated calf stretch w/ strap 2x30sec LLE cues for comfortable ROM HEP discussion/education  Neuromuscular re-ed: Seated NWB toe ext x10 LLE Seated NWB isolated hallux ext 2x10 LLE Seated towel scrunch 2x10 w/ cues for tripod foot and reduced compensations    PATIENT EDUCATION:  Education details: rationale for interventions, HEP  Person educated: Patient Education method: Explanation, Demonstration, Tactile cues, Verbal cues Education comprehension: verbalized understanding, returned demonstration, verbal cues required, tactile cues required, and needs further education     HOME EXERCISE PROGRAM: Access Code: [email or web address] URL: [email or web address] Date: 01/23/2024 Prepared by: Fransisco Hertz  Exercises - Seated Ankle Alphabet  - 3 x daily - 7 x weekly - 1 sets - 10 reps - Supine Active Straight Leg Raise  -  1 x daily - 6-7 x weekly - 2 sets - 5-10 reps - Long Sitting Calf Stretch with Strap  - 2 x daily - 7 x weekly - 2-3 reps - 20 seconds hold - Seated Heel Raise  - 2 x daily - 7 x weekly - 1 sets - 10 reps - Seated Toe Raise  - 2 x daily - 7 x weekly - 1 sets - 10 reps - Toe Yoga - Alternating Great Toe and Lesser Toe Extension  - 2 x daily - 7 x weekly - 1 sets - 10 reps  ASSESSMENT:  CLINICAL IMPRESSION: Pt arrives w/ report of increased pain which she attributes to weather mostly, rates at 5-6/10. She mentions dorsal pain doesn't really seem like it's changing much, seems to be focused around extensor hallucis. After discussion with pt today we elect to try direct limited ROM strengthening which she tolerates relatively well, no dorsal pain but  the last repetition has some transient pain along side of toe. Also working on limiting hallux compensations with dorsiflexion to mitigate overuse. Also able to progress for closed chain stability w/ airex. No adverse events, pt reports 6/10 pain on departure, time spent throughout w/ education/discussion as above.  Recommend continuing along current POC in order to address relevant deficits and improve functional tolerance. Pt departs today's session in no acute distress, all voiced questions/concerns addressed appropriately from PT perspective.     OBJECTIVE IMPAIRMENTS: Abnormal gait, decreased activity tolerance, decreased balance, difficulty walking, decreased ROM, decreased strength, obesity, and pain.   ACTIVITY LIMITATIONS: squatting, stairs, and locomotion level  PARTICIPATION LIMITATIONS: shopping and community activity  PERSONAL FACTORS: Past/current experiences and 1 comorbidity: back dysfunction  are also affecting patient's functional outcome.   REHAB POTENTIAL: Good  CLINICAL DECISION MAKING: Evolving/moderate complexity  EVALUATION COMPLEXITY: Moderate   GOALS:  SHORT TERM GOALS: Target date: 11/24/23 Pt will tolerate full aquatic sessions consistently without increase in pain and with improving function to demonstrate good toleration and effectiveness of intervention.  Baseline: Goal status: Met 11/24/23  2.  Pt will navigate stair into and out of pool 6 steps using step through pattern Baseline: step-to pattern Goal status: In progress 11/10/23. Partially met 11/24/23  3.  Pt will report reduction in pain by 3 NPRS post aquatic session which lasts for up to 24 hours. Baseline:  Goal status: Met 11/24/23  4.  Pt will amb without antalgic gait pattern submerged in 3.6 ft Baseline:  Goal status:MET - 11/17/23  5.  Pt will demo improved quality of gait including increased toe off and stance time on L LE with reduced favoring of L LE. Goal status: INITIAL Target date:   01/23/2024  6.  Pt will demo improved L ankle AROM to at least 0 deg in DF and 7 deg in Eversion for improved stiffness and gait.  Goal status: INITIAL Target date:  01/23/2024     LONG TERM GOALS: Target date: 02/13/24  Pt will demonstrate an improvement in LEFS by at least 9 points for a clinically significant improvement in self perceived disability.  Baseline: 30/80 Goal status: INITIAL  2.  Pt wiil navigate 1 flight of steps using step alternating pattern indep Baseline:  Goal status: INITIAL  3.  Pt will demonstrate improved DF AROM to be w/n 5 deg of R ankle for improved stiffness, gait, and performance of stairs.  Baseline: see chart Goal status: INITIAL  4.  Pt will demo left ankle strength to be at least  4/5 in DF and WFL in PF (tested with manual resistance in open chain) in order for improved performance of and tolerance with functional mobility Baseline: see chart Goal status: INITIAL  5.  Pt will amb without AD without limitation to pain Baseline:using cane or walker  Goal status: INITIAL     PLAN:  PT FREQUENCY: 2x/wk  PT DURATION: 6 weeks  PLANNED INTERVENTIONS: 97164- PT Re-evaluation, 97110-Therapeutic exercises, 97530- Therapeutic activity, 97112- Neuromuscular re-education, 97535- Self Care, 96045- Manual therapy, [phone]- Gait training, [phone]- Orthotic Fit/training, [phone]- Aquatic Therapy, Patient/Family education, Balance training, Stair training, Taping, Dry Needling, Joint mobilization, DME instructions, Cryotherapy, and Moist heat  PLAN FOR NEXT SESSION:  Review HEP and update if tolerable.  Cont with ankle ROM.  Gait training. Ankle stability training   Ashley Murrain PT, DPT 02/05/2024 11:12 AM

## 2024-02-09 DIAGNOSIS — F331 Major depressive disorder, recurrent, moderate: Secondary | ICD-10-CM | POA: Diagnosis not present

## 2024-02-10 ENCOUNTER — Encounter (HOSPITAL_BASED_OUTPATIENT_CLINIC_OR_DEPARTMENT_OTHER): Payer: Self-pay | Admitting: Physical Therapy

## 2024-02-10 ENCOUNTER — Ambulatory Visit (HOSPITAL_BASED_OUTPATIENT_CLINIC_OR_DEPARTMENT_OTHER): Payer: Medicare HMO | Admitting: Physical Therapy

## 2024-02-10 DIAGNOSIS — S82852D Displaced trimalleolar fracture of left lower leg, subsequent encounter for closed fracture with routine healing: Secondary | ICD-10-CM | POA: Diagnosis not present

## 2024-02-10 DIAGNOSIS — M25672 Stiffness of left ankle, not elsewhere classified: Secondary | ICD-10-CM | POA: Diagnosis not present

## 2024-02-10 DIAGNOSIS — M25572 Pain in left ankle and joints of left foot: Secondary | ICD-10-CM | POA: Diagnosis not present

## 2024-02-10 DIAGNOSIS — R262 Difficulty in walking, not elsewhere classified: Secondary | ICD-10-CM

## 2024-02-10 DIAGNOSIS — M79645 Pain in left finger(s): Secondary | ICD-10-CM | POA: Diagnosis not present

## 2024-02-10 DIAGNOSIS — M6281 Muscle weakness (generalized): Secondary | ICD-10-CM | POA: Diagnosis not present

## 2024-02-10 DIAGNOSIS — R2689 Other abnormalities of gait and mobility: Secondary | ICD-10-CM | POA: Diagnosis not present

## 2024-02-10 DIAGNOSIS — R293 Abnormal posture: Secondary | ICD-10-CM | POA: Diagnosis not present

## 2024-02-10 DIAGNOSIS — M5459 Other low back pain: Secondary | ICD-10-CM | POA: Diagnosis not present

## 2024-02-10 NOTE — Therapy (Signed)
 OUTPATIENT PHYSICAL THERAPY LOWER EXTREMITY TREATMENT       Patient Name: Haley White MRN: [id number] DOB:08/22/1956, 68 y.o., female Today's Date: 02/11/2024  END OF SESSION:  PT End of Session - 02/10/24 1201     Visit Number 16    Number of Visits 21    Date for PT Re-Evaluation 02/13/24    Authorization Type aetna mcr    PT Start Time 1158    PT Stop Time 1232    PT Time Calculation (min) 34 min    Activity Tolerance Patient limited by pain    Behavior During Therapy WFL for tasks assessed/performed                    Past Medical History:  Diagnosis Date   Anxiety    Arthritis    Asthma    Cataract    Depression    Gout    Hyperlipidemia    Hypertension    Obese    Sleep apnea    Past Surgical History:  Procedure Laterality Date   ANKLE SURGERY Left    APPENDECTOMY  1984   APPENDECTOMY     CATARACT EXTRACTION W/ INTRAOCULAR LENS  IMPLANT, BILATERAL  2014   CESAREAN SECTION  1984, 1987, 1994   X3   CESAREAN SECTION     x3   COLONOSCOPY  11/16/2013   w/Brodie    EXTERNAL FIXATION LEG Left 06/04/2023   Procedure: EXTERNAL FIXATION LEG;  Surgeon: Netta Cedars, MD;  Location: [facility];  Service: Orthopedics;  Laterality: Left;   EYE SURGERY Bilateral    lasix   FOOT FRACTURE SURGERY Left    FOOT SURGERY Left 2002   FRACTURE SURGERY Left    left ankle   HARDWARE REMOVAL Left 12/03/2023   Procedure: HARDWARE REMOVAL OF SYNDEMOSIS;  Surgeon: Netta Cedars, MD;  Location:  SURGERY CENTER;  Service: Orthopedics;  Laterality: Left;   KNEE SURGERY Left 1998   ORIF ANKLE FRACTURE Left 06/04/2023   Procedure: OPEN REDUCTION INTERNAL FIXATION (ORIF) ANKLE FRACTURE vs EX FIX;  Surgeon: Netta Cedars, MD;  Location: [facility];  Service: Orthopedics;  Laterality: Left;   ROTATOR CUFF REPAIR Right 2007   shoulder     torn rotator cuff   TUBAL LIGATION  1994   TUBAL LIGATION     Patient Active Problem List   Diagnosis Date Noted    OSA on CPAP 08/11/2023   Ankle fracture 06/03/2023   Trimalleolar fracture of left ankle 06/02/2023   Acute left ankle pain 06/02/2023   Sleep-related hypoxia 03/24/2023   At risk for obstructive sleep apnea 03/24/2023   Acute pulmonary embolism (HCC) 05/10/2022   Allergic rhinitis 03/15/2021   Gastro-esophageal reflux disease without esophagitis 03/15/2021   Mild persistent asthma, uncomplicated 03/15/2021   Aortic atherosclerosis (HCC) by Chest CT scan on 02/02/2018  01/10/2021   Asthma    Low back pain 03/17/2019   Lumbosacral spondylosis without myelopathy 03/17/2019   Pseudogout 12/09/2017   Major depressive disorder, recurrent, severe without psychotic features (HCC)    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   Suicide attempt by drug ingestion (HCC)    Vitamin D deficiency 08/22/2014   Hyperlipidemia, mixed 08/22/2014   Hypertension    Anxiety    Abnormal glucose    Class 2 severe obesity due to excess calories with serious comorbidity and body mass index (BMI) of 37.0 to 37.9 in adult [facility])     PCP: Lucky Cowboy MD  REFERRING PROVIDER:  Netta Cedars, MD   REFERRING DIAG: S82.852D (ICD-10-CM) - Displaced trimalleolar fracture of left lower leg, subsequent encounter for closed fracture with routine healing   THERAPY DIAG:  Pain in left ankle and joints of left foot  Stiffness of left ankle, not elsewhere classified  Muscle weakness (generalized)  Difficulty in walking, not elsewhere classified  Rationale for Evaluation and Treatment: Rehabilitation  ONSET DATE: July 2024  SUBJECTIVE:   SUBJECTIVE STATEMENT: Pt is 9 weeks and 6 days s/p hardware removal.  Pt states the rain has really bothered her arthritis.  Pt reports her back started bothering her a lot last week.  Pt has been using the cane due to her back. Pt denies any adverse effects after prior Rx.      PERTINENT HISTORY: The patient is s/p left ankle trimalleolar & syndesmosis ORIF, DOS:  06/05/2023.  Removal of deep hardware on 12/03/2023 Hx of foot/ankle surgery in 2022  Fibromyalgia, HTN, PE s/p covid in 2023 LBP with MRI (10/2022) evidence of chronic mild anterior wedge compression fracture of T12 and chronic anterior wedge compression deformity of L1   PAIN:  Are you having pain? Yes: NPRS scale: 4/10  Pain location:  L lateral ankle Pain description: dull  8/10 bilat lower lumbar flanks.  Pt also reports having pain in R LE which feels like sciatica   PRECAUTIONS: None   RED FLAGS: None   WEIGHT BEARING RESTRICTIONS: WBAT  FALLS:  Has patient fallen in last 6 months? Yes. Number of falls 1 fell down wet hill walking dog  LIVING ENVIRONMENT: Lives with: lives alone Lives in: House/apartment Stairs: Yes: External: 3 steps; none Has following equipment at home: Single point cane   OCCUPATION: retired   PLOF: Independent  PATIENT GOALS: walk normally and as pain free as able  NEXT MD VISIT: June 2025  OBJECTIVE:  Note: Objective measures were completed at Evaluation unless otherwise noted.     LOWER EXTREMITY ROM:   Active ROM Right eval Left eval Left  11/17/23 Right 2/28 Left 2/28 AROM/PROM Left 4/1  Hip flexion             Hip extension             Hip abduction             Hip adduction             Hip internal rotation             Hip external rotation             Knee flexion             Knee extension             Ankle dorsiflexion   -8 Lacking 2 14 Lacking 7 deg / 3  with pain 4 deg  Ankle plantarflexion   30d 42 65 55   Ankle inversion   Limited by 50%   43 24   Ankle eversion   Limited by 50%   10 2 with pain    (Blank rows = not tested)     TODAY'S TREATMENT:  02/10/24 Seated heel/toe raises 2x10-12 Seated towel scrunches BAPS board 2x10 each f/b, cw, and ccw  Ankle DF and PF with YTB x 10 each  Pt received L ankle DF, PF, Inv, and Eve      [facility] Adult PT Treatment:  DATE: 02/05/24 Therapeutic Exercise: Seated heel/toe raises 2x12 (modified to just heel raises after 5 reps due to dorsal discomfort) Yellow band hallux ext 3x5 rest breaks to mitigate fatigue  Neuromuscular re-ed: Wobbleboard DF emphasis on minimizing toe compensations 2x12 Airex weight shifts 2x6 w UE support   Self Care: Continued education re: relevant anatomy/physiology and rationale for interventions as pertains to symptom behavior, gradual progression of activity w/ monitoring for symptoms and appropriate modifications as needed   02/03/24 Reviewed pt presentation, HEP compliance, pain levels, and response to prior Rx.  Pt received L ankle DF, PF, Inv, and Eve PROM.  BAPS board 2x10 up/down, cw, and ccw Seated heel raises and toe raises 2x10 each Toe yoga x 10 reps Towel scrunches  Standing gastroc stretch at counter 2x30 sec Standing soleus stretch at counter approx 20 sec Sit to stands from table x10   [facility] Adult PT Treatment:                                                DATE: 01/29/24 Therapeutic Exercise: Seated heel/toe raises 3x10 Standing soleus stretch 3x30sec w/ UE support cues for form and comfortable ROM Standing gastroc stretch 3x30sec w/ UE support cues for comfortable ROM HEP discussion/education Increased time w/ exercises to discuss relevant anatomy/physiology and rationale for interventions, observed compensations  Neuromuscular re-ed: Wobbleboard PF/DF 2x12 for ankle stability cues to reduce toe ext compensations with DF Deflated ball DF/PF rollout x15 cues for pacing and form    [facility] Adult PT Treatment:                                                DATE: 01/23/24 Therapeutic Exercise: Self ROM of L hallux 2x10 within comfortable ROM, cues for setup  Standing gastroc stretch at counter gently rocking within comfortable ROM x8 Standing soleus stretch at counter, gentle rocking x5 (discontinued due to discomfort)  Ankle pumps x15 Seated calf stretch  w/ strap 2x30sec HEP update + education/handout, increased time spent w/ education/discussion on relevant anatomy/physiology and rationale for interventions  Neuromuscular re-ed: NWB isolated hallux extension x15 NWB toe extension (hallux down) x15 Towel scrunch x15 cues for tripod     PATIENT EDUCATION:  Education details: rationale for interventions, HEP, exercise form, relevant anatomy.  Person educated: Patient Education method: Explanation, Demonstration, Tactile cues, Verbal cues Education comprehension: verbalized understanding, returned demonstration, verbal cues required, tactile cues required, and needs further education     HOME EXERCISE PROGRAM: Access Code: [email or web address] URL: [email or web address] Date: 01/23/2024 Prepared by: Fransisco Hertz  Exercises - Seated Ankle Alphabet  - 3 x daily - 7 x weekly - 1 sets - 10 reps - Supine Active Straight Leg Raise  - 1 x daily - 6-7 x weekly - 2 sets - 5-10 reps - Long Sitting Calf Stretch with Strap  - 2 x daily - 7 x weekly - 2-3 reps - 20 seconds hold - Seated Heel Raise  - 2 x daily - 7 x weekly - 1 sets - 10 reps - Seated Toe Raise  - 2 x daily - 7 x weekly - 1 sets - 10 reps - Toe Yoga - Alternating Great Toe and Lesser Toe Extension  - 2 x  daily - 7 x weekly - 1 sets - 10 reps  ASSESSMENT:  CLINICAL IMPRESSION: Pt presents to Rx with a cane due to her back pain.  Pt states her arthritis has been flared up lately.  PT limited exercises including not performing standing exercises due to back pain.  Pt is moving slowly due to back pain.  PT set up pt in comfortable positions in sitting to perform exercises without increasing her back pain.  She performed exercises well.  Pt had decreased ROM with YTB though ROM improved with increased reps.  Pt states she feels about the same, maybe close to a 5/10 after Rx.  Pt states she will ice it when she gets home.  Pt should benefit from cont skilled PT to address impairments  and ongoing goals and to assist in restoring desired level of function.    OBJECTIVE IMPAIRMENTS: Abnormal gait, decreased activity tolerance, decreased balance, difficulty walking, decreased ROM, decreased strength, obesity, and pain.   ACTIVITY LIMITATIONS: squatting, stairs, and locomotion level  PARTICIPATION LIMITATIONS: shopping and community activity  PERSONAL FACTORS: Past/current experiences and 1 comorbidity: back dysfunction  are also affecting patient's functional outcome.   REHAB POTENTIAL: Good  CLINICAL DECISION MAKING: Evolving/moderate complexity  EVALUATION COMPLEXITY: Moderate   GOALS:  SHORT TERM GOALS: Target date: 11/24/23 Pt will tolerate full aquatic sessions consistently without increase in pain and with improving function to demonstrate good toleration and effectiveness of intervention.  Baseline: Goal status: Met 11/24/23  2.  Pt will navigate stair into and out of pool 6 steps using step through pattern Baseline: step-to pattern Goal status: In progress 11/10/23. Partially met 11/24/23  3.  Pt will report reduction in pain by 3 NPRS post aquatic session which lasts for up to 24 hours. Baseline:  Goal status: Met 11/24/23  4.  Pt will amb without antalgic gait pattern submerged in 3.6 ft Baseline:  Goal status:MET - 11/17/23  5.  Pt will demo improved quality of gait including increased toe off and stance time on L LE with reduced favoring of L LE. Goal status: INITIAL Target date:  01/23/2024  6.  Pt will demo improved L ankle AROM to at least 0 deg in DF and 7 deg in Eversion for improved stiffness and gait.  Goal status: INITIAL Target date:  01/23/2024     LONG TERM GOALS: Target date: 02/13/24  Pt will demonstrate an improvement in LEFS by at least 9 points for a clinically significant improvement in self perceived disability.  Baseline: 30/80 Goal status: INITIAL  2.  Pt wiil navigate 1 flight of steps using step alternating pattern  indep Baseline:  Goal status: INITIAL  3.  Pt will demonstrate improved DF AROM to be w/n 5 deg of R ankle for improved stiffness, gait, and performance of stairs.  Baseline: see chart Goal status: INITIAL  4.  Pt will demo left ankle strength to be at least 4/5 in DF and WFL in PF (tested with manual resistance in open chain) in order for improved performance of and tolerance with functional mobility Baseline: see chart Goal status: INITIAL  5.  Pt will amb without AD without limitation to pain Baseline:using cane or walker  Goal status: INITIAL     PLAN:  PT FREQUENCY: 2x/wk  PT DURATION: 6 weeks  PLANNED INTERVENTIONS: 97164- PT Re-evaluation, 97110-Therapeutic exercises, 97530- Therapeutic activity, 97112- Neuromuscular re-education, 97535- Self Care, 16109- Manual therapy, [phone]- Gait training, [phone]- Orthotic Fit/training, [phone]- Aquatic Therapy, Patient/Family education, Balance  training, Stair training, Taping, Dry Needling, Joint mobilization, DME instructions, Cryotherapy, and Moist heat  PLAN FOR NEXT SESSION:  Review HEP and update if tolerable.  Cont with ankle ROM.  Gait training. Ankle stability training.  Be aware of back pain.   Audie Clear III PT, DPT 02/11/24 12:51 PM '

## 2024-02-11 ENCOUNTER — Encounter: Payer: Self-pay | Admitting: Internal Medicine

## 2024-02-11 ENCOUNTER — Telehealth: Payer: Medicare HMO | Admitting: Internal Medicine

## 2024-02-11 DIAGNOSIS — J4530 Mild persistent asthma, uncomplicated: Secondary | ICD-10-CM

## 2024-02-11 DIAGNOSIS — J301 Allergic rhinitis due to pollen: Secondary | ICD-10-CM

## 2024-02-11 DIAGNOSIS — G4733 Obstructive sleep apnea (adult) (pediatric): Secondary | ICD-10-CM

## 2024-02-11 DIAGNOSIS — K219 Gastro-esophageal reflux disease without esophagitis: Secondary | ICD-10-CM

## 2024-02-11 DIAGNOSIS — M47816 Spondylosis without myelopathy or radiculopathy, lumbar region: Secondary | ICD-10-CM | POA: Diagnosis not present

## 2024-02-11 DIAGNOSIS — M791 Myalgia, unspecified site: Secondary | ICD-10-CM | POA: Diagnosis not present

## 2024-02-11 MED ORDER — CETIRIZINE HCL 10 MG PO TABS: 10.0000 mg | ORAL_TABLET | Freq: Every day | ORAL | 3 refills | Status: AC

## 2024-02-11 MED ORDER — AZELASTINE HCL 0.1 % NA SOLN: 1.0000 | Freq: Two times a day (BID) | NASAL | 12 refills | Status: AC

## 2024-02-11 NOTE — Progress Notes (Signed)
 I connected with Haley White on 02/11/2024 by video enabled telemedicine application and verified that I am speaking with the correct person using two identifiers. Patient is at home, Physician is in office.    I discussed the limitations of evaluation and management by telemedicine. The patient expressed understanding and agreed to proceed.        Haley White    [id number]    May 07, 1956  Primary Care Physician:McKeown, Chrissie Noa, MD Date of Appointment: 02/11/2024 Established Patient Visit  Chief complaint:   Chief Complaint  Patient presents with   osa on cpap     HPI: Haley White is a 68 y.o. woman with moderate persistent asthma and covid 19 infection in June 2023. History of provoked pulmonary embolism with covid, treated with six months of eliquis.   Interval Updates: Here for follow up for asthma and osa.   Overall asthma doing much better on symbicort  Cpap download reviewed:  20% adherence in the last 30 days. But when she does wear it she has AHI of 2.8 with median pressure of 16.8 cm H20, minimal leak. New mask working well.  Settings: autocpap 5-20 with fulltime EPR level 3  Current Regimen: symbicort 2 puffs in the morning. Asthma Triggers: URIs Exacerbations in the last year: once History of hospitalization or intubation: none Allergy Testing/rhinitis: yes on allegra, singulair, flonase (follows at [facility]) GERD: ACT:  Asthma Control Test ACT Total Score  11/13/2023 11:06 AM 24  03/13/2023  9:34 AM 20  04/17/2022  2:30 PM 15   FeNO: Serum Eos/IgE:   I have reviewed the patient's family social and past medical history and updated as appropriate.   Past Medical History:  Diagnosis Date   Anxiety    Arthritis    Asthma    Cataract    Depression    Gout    Hyperlipidemia    Hypertension    Obese    Sleep apnea     Past Surgical History:  Procedure Laterality Date   ANKLE SURGERY Left    APPENDECTOMY  1984   APPENDECTOMY     CATARACT  EXTRACTION W/ INTRAOCULAR LENS  IMPLANT, BILATERAL  2014   CESAREAN SECTION  1984, 1987, 1994   X3   CESAREAN SECTION     x3   COLONOSCOPY  11/16/2013   w/Brodie    EXTERNAL FIXATION LEG Left 06/04/2023   Procedure: EXTERNAL FIXATION LEG;  Surgeon: Netta Cedars, MD;  Location: [facility];  Service: Orthopedics;  Laterality: Left;   EYE SURGERY Bilateral    lasix   FOOT FRACTURE SURGERY Left    FOOT SURGERY Left 2002   FRACTURE SURGERY Left    left ankle   HARDWARE REMOVAL Left 12/03/2023   Procedure: HARDWARE REMOVAL OF SYNDEMOSIS;  Surgeon: Netta Cedars, MD;  Location: [facility];  Service: Orthopedics;  Laterality: Left;   KNEE SURGERY Left 1998   ORIF ANKLE FRACTURE Left 06/04/2023   Procedure: OPEN REDUCTION INTERNAL FIXATION (ORIF) ANKLE FRACTURE vs EX FIX;  Surgeon: Netta Cedars, MD;  Location: [facility];  Service: Orthopedics;  Laterality: Left;   ROTATOR CUFF REPAIR Right 2007   shoulder     torn rotator cuff   TUBAL LIGATION  1994   TUBAL LIGATION      Family History  Problem Relation Age of Onset   Dementia Father    Anxiety disorder Sister    Colon cancer Neg Hx    Colon polyps Neg Hx  Esophageal cancer Neg Hx    Stomach cancer Neg Hx    Rectal cancer Neg Hx     Social History   Occupational History   Not on file  Tobacco Use   Smoking status: Never   Smokeless tobacco: Never  Vaping Use   Vaping status: Never Used  Substance and Sexual Activity   Alcohol use: Yes    Comment: occasionally   Drug use: Never    Comment: 06/03/2022-gummies   Sexual activity: Yes    Birth control/protection: Post-menopausal, Surgical     Physical Exam: Last menstrual period 11/26/2011.  Gen:      No distress ENT:  MMM  Lungs:    ctab no wheeze CV:         RRR no mrg  Data Reviewed: Imaging: I have personally reviewed the CT Chest which shows resolved GGO from previous covid infection. Acute right sided PE without right heart strain.    PFTs:     Latest Ref Rng & Units 06/14/2022   11:52 AM  PFT Results  FVC-Pre L 3.00   FVC-Predicted Pre % 102   FVC-Post L 2.98   FVC-Predicted Post % 101   Pre FEV1/FVC % % 84   Post FEV1/FCV % % 87   FEV1-Pre L 2.53   FEV1-Predicted Pre % 113   FEV1-Post L 2.58   DLCO uncorrected ml/min/mmHg 17.01   DLCO UNC% % 91   DLCO corrected ml/min/mmHg 18.78   DLCO COR %Predicted % 101   DLVA Predicted % 102   TLC L 5.07   TLC % Predicted % 106   RV % Predicted % 101    I have personally reviewed the patient's PFTs and they show normal pulmonary function.   Labs: Lab Results  Component Value Date   WBC 7.7 10/24/2023   HGB 13.0 10/24/2023   HCT 39.9 10/24/2023   MCV 94.1 10/24/2023   PLT 297 10/24/2023   Lab Results  Component Value Date   NA 143 10/24/2023   K 4.3 10/24/2023   CL 106 10/24/2023   CO2 27 10/24/2023     Immunization status: Immunization History  Administered Date(s) Administered   Influenza, High Dose Seasonal PF 10/17/2017   Influenza, Quadrivalent, Recombinant, Inj, Pf 08/20/2018   Influenza,inj,Quad PF,6+ Mos 09/17/2017   Influenza-Unspecified 08/20/2018   PFIZER(Purple Top)SARS-COV-2 Vaccination 01/20/2020, 02/10/2020, 03/27/2020   PPD Test 10/08/2017, 11/29/2019, 01/10/2021   Pneumococcal Conjugate-13 11/05/1995   Pneumococcal Polysaccharide-23 10/08/2017, 06/15/2021   Td 11/29/2019   Tdap 11/04/2008, 03/28/2020    External Records Personally Reviewed: hospital stay  Assessment:  Mild intermittent asthma, recent exacerbation Gerd, controlled Seasonal allergies, controlled OSA on CPAP  Plan/Recommendations: Continue CPAP therapy.   Continue symbicort 2 puffs once daily, gargle after use.   If you are sick or could get sick start taking symbicort regularly 2 puffs twice a day an extend for three more days after you are feeling better.   Continue reflux and allergy medications. I have refilled azelastine and cetirizine for  allergies  Return to Care: Return in about 6 months (around 08/12/2024).   Durel Salts, MD Pulmonary and Critical Care Medicine [facility] Office:[phone]

## 2024-02-11 NOTE — Patient Instructions (Addendum)
 It was a pleasure to see you today!  Please schedule follow up scheduled with myself in 6 months.  If my schedule is not open yet, we will contact you with a reminder closer to that time. Please call [phone] if you haven't heard from [place] a month before, and always call us sooner if issues or concerns arise. You can also send [place] a message through MyChart, but but aware that this is not to be used for urgent issues and it may take up to 5-7 days to receive a reply. Please be aware that you will likely be able to view your results before I have a chance to respond to them. Please give [place] 5 business days to respond to any non-urgent results.   Continue CPAP therapy.   Continue symbicort 2 puffs once daily, gargle after use.   If you are sick or could get sick start taking symbicort regularly 2 puffs twice a day an extend for three more days after you are feeling better.   Continue reflux and allergy medications. I have refilled cetirizine and azelastine for allergies.

## 2024-02-11 NOTE — Progress Notes (Signed)
 Haley White    [id number]    09/04/1956  Primary Care Physician:McKeown, Chrissie Noa, MD Date of Appointment: 02/11/2024 Established Patient Visit  Chief complaint:   No chief complaint on file.    HPI: Haley White is a 68 y.o. woman with moderate persistent asthma and covid 19 infection in June 2023. History of provoked pulmonary embolism with covid, treated with six months of eliquis.   Interval Updates: Here for follow up for asthma. Allergies not well controlled.   No interval exacerbations for asthma requiring steroids. Taking symbicort 2 puffs once daily.   She is now back in her home as of last month when her home had some mold and water damage and this is doing better.   Having some back pain, wonders if related to gait issues from previous ankle fracture.   Wearing cpap - got a new mask that fits her well. She feels has been having improved symptoms of daytime sleepiness. DME [facility].  Current Regimen: 2 puffs in the morning. Asthma Triggers: URIs Exacerbations in the last year: once History of hospitalization or intubation: none Allergy Testing/rhinitis: yes on allegra, singulair, flonase (follows at [facility]) GERD: ACT:  Asthma Control Test ACT Total Score  11/13/2023 11:06 AM 24  03/13/2023  9:34 AM 20  04/17/2022  2:30 PM 15   FeNO: Serum Eos/IgE:   I have reviewed the patient's family social and past medical history and updated as appropriate.   Past Medical History:  Diagnosis Date   Anxiety    Arthritis    Asthma    Cataract    Depression    Gout    Hyperlipidemia    Hypertension    Obese    Sleep apnea     Past Surgical History:  Procedure Laterality Date   ANKLE SURGERY Left    APPENDECTOMY  1984   APPENDECTOMY     CATARACT EXTRACTION W/ INTRAOCULAR LENS  IMPLANT, BILATERAL  2014   CESAREAN SECTION  1984, 1987, 1994   X3   CESAREAN SECTION     x3   COLONOSCOPY  11/16/2013   w/Brodie    EXTERNAL FIXATION LEG Left  06/04/2023   Procedure: EXTERNAL FIXATION LEG;  Surgeon: Netta Cedars, MD;  Location: [facility];  Service: Orthopedics;  Laterality: Left;   EYE SURGERY Bilateral    lasix   FOOT FRACTURE SURGERY Left    FOOT SURGERY Left 2002   FRACTURE SURGERY Left    left ankle   HARDWARE REMOVAL Left 12/03/2023   Procedure: HARDWARE REMOVAL OF SYNDEMOSIS;  Surgeon: Netta Cedars, MD;  Location: [facility];  Service: Orthopedics;  Laterality: Left;   KNEE SURGERY Left 1998   ORIF ANKLE FRACTURE Left 06/04/2023   Procedure: OPEN REDUCTION INTERNAL FIXATION (ORIF) ANKLE FRACTURE vs EX FIX;  Surgeon: Netta Cedars, MD;  Location: [facility];  Service: Orthopedics;  Laterality: Left;   ROTATOR CUFF REPAIR Right 2007   shoulder     torn rotator cuff   TUBAL LIGATION  1994   TUBAL LIGATION      Family History  Problem Relation Age of Onset   Dementia Father    Anxiety disorder Sister    Colon cancer Neg Hx    Colon polyps Neg Hx    Esophageal cancer Neg Hx    Stomach cancer Neg Hx    Rectal cancer Neg Hx     Social History   Occupational History  Not on file  Tobacco Use   Smoking status: Never   Smokeless tobacco: Never  Vaping Use   Vaping status: Never Used  Substance and Sexual Activity   Alcohol use: Yes    Comment: occasionally   Drug use: Never    Comment: 06/03/2022-gummies   Sexual activity: Yes    Birth control/protection: Post-menopausal, Surgical     Physical Exam: Last menstrual period 11/26/2011.  Gen:      No distress ENT:  MMM  Lungs:    ctab no wheeze CV:         RRR no mrg  Data Reviewed: Imaging: I have personally reviewed the CT Chest which shows resolved GGO from previous covid infection. Acute right sided PE without right heart strain.   PFTs:     Latest Ref Rng & Units 06/14/2022   11:52 AM  PFT Results  FVC-Pre L 3.00   FVC-Predicted Pre % 102   FVC-Post L 2.98   FVC-Predicted Post % 101   Pre FEV1/FVC % % 84   Post  FEV1/FCV % % 87   FEV1-Pre L 2.53   FEV1-Predicted Pre % 113   FEV1-Post L 2.58   DLCO uncorrected ml/min/mmHg 17.01   DLCO UNC% % 91   DLCO corrected ml/min/mmHg 18.78   DLCO COR %Predicted % 101   DLVA Predicted % 102   TLC L 5.07   TLC % Predicted % 106   RV % Predicted % 101    I have personally reviewed the patient's PFTs and they show normal pulmonary function.   Labs: Lab Results  Component Value Date   WBC 7.7 10/24/2023   HGB 13.0 10/24/2023   HCT 39.9 10/24/2023   MCV 94.1 10/24/2023   PLT 297 10/24/2023   Lab Results  Component Value Date   NA 143 10/24/2023   K 4.3 10/24/2023   CL 106 10/24/2023   CO2 27 10/24/2023     Immunization status: Immunization History  Administered Date(s) Administered   Influenza, High Dose Seasonal PF 10/17/2017   Influenza, Quadrivalent, Recombinant, Inj, Pf 08/20/2018   Influenza,inj,Quad PF,6+ Mos 09/17/2017   Influenza-Unspecified 08/20/2018   PFIZER(Purple Top)SARS-COV-2 Vaccination 01/20/2020, 02/10/2020, 03/27/2020   PPD Test 10/08/2017, 11/29/2019, 01/10/2021   Pneumococcal Conjugate-13 11/05/1995   Pneumococcal Polysaccharide-23 10/08/2017, 06/15/2021   Td 11/29/2019   Tdap 11/04/2008, 03/28/2020    External Records Personally Reviewed: hospital stay  Assessment:  Mild intermittent asthma, recent exacerbation Gerd, controlled Seasonal allergies, not well controlled. OSA on CPAP  Plan/Recommendations:  Switch to Zyrtec from allegra.    Continue symbicort 2 puffs once daily, gargle after use.   If you are sick or could get sick start taking symbicort regularly 2 puffs twice a day an extend for three more days after you are feeling better.   Continue reflux and allergy medications.   Return to Care: No follow-ups on file.   Durel Salts, MD Pulmonary and Critical Care Medicine [facility] Office:[phone]

## 2024-02-12 ENCOUNTER — Ambulatory Visit (HOSPITAL_BASED_OUTPATIENT_CLINIC_OR_DEPARTMENT_OTHER): Payer: Medicare HMO | Admitting: Physical Therapy

## 2024-02-12 ENCOUNTER — Other Ambulatory Visit: Payer: Self-pay | Admitting: Anesthesiology

## 2024-02-12 DIAGNOSIS — M6281 Muscle weakness (generalized): Secondary | ICD-10-CM | POA: Diagnosis not present

## 2024-02-12 DIAGNOSIS — M25672 Stiffness of left ankle, not elsewhere classified: Secondary | ICD-10-CM

## 2024-02-12 DIAGNOSIS — M25572 Pain in left ankle and joints of left foot: Secondary | ICD-10-CM

## 2024-02-12 DIAGNOSIS — R262 Difficulty in walking, not elsewhere classified: Secondary | ICD-10-CM

## 2024-02-12 DIAGNOSIS — G4733 Obstructive sleep apnea (adult) (pediatric): Secondary | ICD-10-CM | POA: Diagnosis not present

## 2024-02-12 DIAGNOSIS — R293 Abnormal posture: Secondary | ICD-10-CM | POA: Diagnosis not present

## 2024-02-12 DIAGNOSIS — M545 Low back pain, unspecified: Secondary | ICD-10-CM

## 2024-02-12 DIAGNOSIS — R2689 Other abnormalities of gait and mobility: Secondary | ICD-10-CM | POA: Diagnosis not present

## 2024-02-12 DIAGNOSIS — S82852D Displaced trimalleolar fracture of left lower leg, subsequent encounter for closed fracture with routine healing: Secondary | ICD-10-CM

## 2024-02-12 DIAGNOSIS — M5459 Other low back pain: Secondary | ICD-10-CM | POA: Diagnosis not present

## 2024-02-12 NOTE — Therapy (Signed)
 OUTPATIENT PHYSICAL THERAPY LOWER EXTREMITY TREATMENT       Patient Name: Haley White MRN: [id number] DOB:[birth year], 68 y.o., female Today's Date: 02/12/2024  END OF SESSION:  PT End of Session - 02/12/24 1244     Visit Number 17    Number of Visits 21    Date for PT Re-Evaluation 02/13/24    Authorization Type aetna mcr    PT Start Time 1150    PT Stop Time 1230    PT Time Calculation (min) 40 min    Activity Tolerance Patient limited by pain    Behavior During Therapy WFL for tasks assessed/performed              Past Medical History:  Diagnosis Date   Anxiety    Arthritis    Asthma    Cataract    Depression    Gout    Hyperlipidemia    Hypertension    Obese    Sleep apnea    Past Surgical History:  Procedure Laterality Date   ANKLE SURGERY Left    APPENDECTOMY  1984   APPENDECTOMY     CATARACT EXTRACTION W/ INTRAOCULAR LENS  IMPLANT, BILATERAL  2014   CESAREAN SECTION  1984, 1987, 1994   X3   CESAREAN SECTION     x3   COLONOSCOPY  11/16/2013   w/Brodie    EXTERNAL FIXATION LEG Left 06/04/2023   Procedure: EXTERNAL FIXATION LEG;  Surgeon: Netta Cedars, MD;  Location: [facility];  Service: Orthopedics;  Laterality: Left;   EYE SURGERY Bilateral    lasix   FOOT FRACTURE SURGERY Left    FOOT SURGERY Left 2002   FRACTURE SURGERY Left    left ankle   HARDWARE REMOVAL Left 12/03/2023   Procedure: HARDWARE REMOVAL OF SYNDEMOSIS;  Surgeon: Netta Cedars, MD;  Location: [facility];  Service: Orthopedics;  Laterality: Left;   KNEE SURGERY Left 1998   ORIF ANKLE FRACTURE Left 06/04/2023   Procedure: OPEN REDUCTION INTERNAL FIXATION (ORIF) ANKLE FRACTURE vs EX FIX;  Surgeon: Netta Cedars, MD;  Location: [facility];  Service: Orthopedics;  Laterality: Left;   ROTATOR CUFF REPAIR Right 2007   shoulder     torn rotator cuff   TUBAL LIGATION  1994   TUBAL LIGATION     Patient Active Problem List   Diagnosis Date Noted   OSA on CPAP  08/11/2023   Ankle fracture 06/03/2023   Trimalleolar fracture of left ankle 06/02/2023   Acute left ankle pain 06/02/2023   Sleep-related hypoxia 03/24/2023   At risk for obstructive sleep apnea 03/24/2023   Acute pulmonary embolism (HCC) 05/10/2022   Allergic rhinitis 03/15/2021   Gastro-esophageal reflux disease without esophagitis 03/15/2021   Mild persistent asthma, uncomplicated 03/15/2021   Aortic atherosclerosis (HCC) by Chest CT scan on 02/02/2018  01/10/2021   Asthma    Low back pain 03/17/2019   Lumbosacral spondylosis without myelopathy 03/17/2019   Pseudogout 12/09/2017   Major depressive disorder, recurrent, severe without psychotic features (HCC)    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  Rationale for Evaluation and Treatment: Rehabilitation  ONSET DATE: July 2024  SUBJECTIVE:   SUBJECTIVE STATEMENT: Pt states her ankle has been okay -- has been tolerating the resistance bands well. Reports her back is giving her more of a problem than her ankle. Has a lot of back pain right now -- thinks it's disc related. Will be getting MRI on Monday.    PERTINENT HISTORY: The patient is s/p left ankle trimalleolar & syndesmosis ORIF, DOS: 06/05/2023.  Removal of deep hardware on 12/03/2023 Hx of foot/ankle surgery in 2022  Fibromyalgia, HTN, PE s/p covid in 2023 LBP with MRI (10/2022) evidence of chronic mild anterior wedge  compression fracture of T12 and chronic anterior wedge compression deformity of L1   PAIN:  Are you having pain? Yes: NPRS scale: 4/10  Pain location:  L lateral ankle Pain description: dull  8/10 bilat lower lumbar flanks.  Pt also reports having pain in R LE which feels like sciatica   PRECAUTIONS: None   RED FLAGS: None   WEIGHT BEARING RESTRICTIONS: WBAT  FALLS:  Has patient fallen in last 6 months? Yes. Number of falls 1 fell down wet hill walking dog  LIVING ENVIRONMENT: Lives with: lives alone Lives in: House/apartment Stairs: Yes: External: 3 steps; none Has following equipment at home: Single point cane   OCCUPATION: retired   PLOF: Independent  PATIENT GOALS: walk normally and as pain free as able  NEXT MD VISIT: June 2025  OBJECTIVE:  Note: Objective measures were completed at Evaluation unless otherwise noted.     LOWER EXTREMITY ROM:   Active ROM Right eval Left eval Left  11/17/23 Right 2/28 Left 2/28 AROM/PROM Left 4/1  Hip flexion             Hip extension             Hip abduction             Hip adduction             Hip internal rotation             Hip external rotation             Knee flexion             Knee extension             Ankle dorsiflexion   -8 Lacking 2 14 Lacking 7 deg / 3  with pain 4 deg  Ankle plantarflexion   30d 42 65 55   Ankle inversion   Limited by 50%   43 24   Ankle eversion   Limited by 50%   10 2 with pain    (Blank rows = not tested)     TODAY'S TREATMENT:  02/12/24 Nustep L4 x 3 min before aggravated back too much Heel slide x20 Supine calf stretch with strap x 30" Supine ankle PF/DF with knee bent 2x10 Supine toe yoga with knee bent 2x10 Supine towel scrunch 2x10 Supine towel ankle inv/ev 2x10 Supine knees bent, ankle DF red TB x10 knees straight ankle DF red TB x10 Supine ankle inv red TB 2x10 2 "I" strip ktape following peroneals   02/10/24 Seated heel/toe raises 2x10-12 Seated towel  scrunches BAPS board 2x10 each f/b, cw, and ccw  Ankle DF and PF with YTB x  10 each  Pt received L ankle DF, PF, Inv, and Eve      [facility] Adult PT Treatment:                                                DATE: 02/05/24 Therapeutic Exercise: Seated heel/toe raises 2x12 (modified to just heel raises after 5 reps due to dorsal discomfort) Yellow band hallux ext 3x5 rest breaks to mitigate fatigue  Neuromuscular re-ed: Wobbleboard DF emphasis on minimizing toe compensations 2x12 Airex weight shifts 2x6 w UE support   Self Care: Continued education re: relevant anatomy/physiology and rationale for interventions as pertains to symptom behavior, gradual progression of activity w/ monitoring for symptoms and appropriate modifications as needed   02/03/24 Reviewed pt presentation, HEP compliance, pain levels, and response to prior Rx.  Pt received L ankle DF, PF, Inv, and Eve PROM.  BAPS board 2x10 up/down, cw, and ccw Seated heel raises and toe raises 2x10 each Toe yoga x 10 reps Towel scrunches  Standing gastroc stretch at counter 2x30 sec Standing soleus stretch at counter approx 20 sec Sit to stands from table x10   [facility] Adult PT Treatment:                                                DATE: 01/29/24 Therapeutic Exercise: Seated heel/toe raises 3x10 Standing soleus stretch 3x30sec w/ UE support cues for form and comfortable ROM Standing gastroc stretch 3x30sec w/ UE support cues for comfortable ROM HEP discussion/education Increased time w/ exercises to discuss relevant anatomy/physiology and rationale for interventions, observed compensations  Neuromuscular re-ed: Wobbleboard PF/DF 2x12 for ankle stability cues to reduce toe ext compensations with DF Deflated ball DF/PF rollout x15 cues for pacing and form    [facility] Adult PT Treatment:                                                DATE: 01/23/24 Therapeutic Exercise: Self ROM of L hallux 2x10 within comfortable ROM, cues for  setup  Standing gastroc stretch at counter gently rocking within comfortable ROM x8 Standing soleus stretch at counter, gentle rocking x5 (discontinued due to discomfort)  Ankle pumps x15 Seated calf stretch w/ strap 2x30sec HEP update + education/handout, increased time spent w/ education/discussion on relevant anatomy/physiology and rationale for interventions  Neuromuscular re-ed: NWB isolated hallux extension x15 NWB toe extension (hallux down) x15 Towel scrunch x15 cues for tripod     PATIENT EDUCATION:  Education details: rationale for interventions, HEP, exercise form, relevant anatomy.  Person educated: Patient Education method: Explanation, Demonstration, Tactile cues, Verbal cues Education comprehension: verbalized understanding, returned demonstration, verbal cues required, tactile cues required, and needs further education     HOME EXERCISE PROGRAM: Access Code: [email or web address] URL: [email or web address] Date: 01/23/2024 Prepared by: Fransisco Hertz  Exercises - Seated Ankle Alphabet  - 3 x daily - 7 x weekly - 1 sets - 10 reps - Supine Active Straight Leg Raise  - 1 x daily - 6-7 x weekly - 2 sets - 5-10 reps - Long Sitting Calf Stretch with Strap  -  2 x daily - 7 x weekly - 2-3 reps - 20 seconds hold - Seated Heel Raise  - 2 x daily - 7 x weekly - 1 sets - 10 reps - Seated Toe Raise  - 2 x daily - 7 x weekly - 1 sets - 10 reps - Toe Yoga - Alternating Great Toe and Lesser Toe Extension  - 2 x daily - 7 x weekly - 1 sets - 10 reps  ASSESSMENT:  CLINICAL IMPRESSION: Pt with increased back pain today. Performed more of her exercises in supine to make it more comfortable for her back. Able to tolerate progression to red TB. Improving ankle ROM noted. Pain has been mostly along peroneal tendon insertions. Performed k tape today to see if this will help further stabilize her ankle and decrease pain. Received referral to address her back pain. Can likely assess and  treat next session   OBJECTIVE IMPAIRMENTS: Abnormal gait, decreased activity tolerance, decreased balance, difficulty walking, decreased ROM, decreased strength, obesity, and pain.   ACTIVITY LIMITATIONS: squatting, stairs, and locomotion level  PARTICIPATION LIMITATIONS: shopping and community activity  PERSONAL FACTORS: Past/current experiences and 1 comorbidity: back dysfunction  are also affecting patient's functional outcome.   REHAB POTENTIAL: Good  CLINICAL DECISION MAKING: Evolving/moderate complexity  EVALUATION COMPLEXITY: Moderate   GOALS:  SHORT TERM GOALS: Target date: 11/24/23 Pt will tolerate full aquatic sessions consistently without increase in pain and with improving function to demonstrate good toleration and effectiveness of intervention.  Baseline: Goal status: Met 11/24/23  2.  Pt will navigate stair into and out of pool 6 steps using step through pattern Baseline: step-to pattern Goal status: In progress 11/10/23. Partially met 11/24/23  3.  Pt will report reduction in pain by 3 NPRS post aquatic session which lasts for up to 24 hours. Baseline:  Goal status: Met 11/24/23  4.  Pt will amb without antalgic gait pattern submerged in 3.6 ft Baseline:  Goal status:MET - 11/17/23  5.  Pt will demo improved quality of gait including increased toe off and stance time on L LE with reduced favoring of L LE. Goal status: INITIAL Target date:  01/23/2024  6.  Pt will demo improved L ankle AROM to at least 0 deg in DF and 7 deg in Eversion for improved stiffness and gait.  Goal status: INITIAL Target date:  01/23/2024     LONG TERM GOALS: Target date: 02/13/24  Pt will demonstrate an improvement in LEFS by at least 9 points for a clinically significant improvement in self perceived disability.  Baseline: 30/80 Goal status: INITIAL  2.  Pt wiil navigate 1 flight of steps using step alternating pattern indep Baseline:  Goal status: INITIAL  3.  Pt will  demonstrate improved DF AROM to be w/n 5 deg of R ankle for improved stiffness, gait, and performance of stairs.  Baseline: see chart Goal status: INITIAL  4.  Pt will demo left ankle strength to be at least 4/5 in DF and WFL in PF (tested with manual resistance in open chain) in order for improved performance of and tolerance with functional mobility Baseline: see chart Goal status: INITIAL  5.  Pt will amb without AD without limitation to pain Baseline:using cane or walker  Goal status: INITIAL     PLAN:  PT FREQUENCY: 2x/wk  PT DURATION: 6 weeks  PLANNED INTERVENTIONS: 97164- PT Re-evaluation, 97110-Therapeutic exercises, 97530- Therapeutic activity, 97112- Neuromuscular re-education, 97535- Self Care, 16109- Manual therapy, [id number]- Gait training, [phone]-  Orthotic Fit/training, 90211- Aquatic Therapy, Patient/Family education, Balance training, Stair training, Taping, Dry Needling, Joint mobilization, DME instructions, Cryotherapy, and Moist heat  PLAN FOR NEXT SESSION:  Review HEP and update if tolerable.  Cont with ankle ROM.  Gait training. Ankle stability training.  Be aware of back pain.  [facility] April Dell Ponto, [place], DPT 02/12/24 12:45 PM '

## 2024-02-16 ENCOUNTER — Ambulatory Visit
Admission: RE | Admit: 2024-02-16 | Discharge: 2024-02-16 | Disposition: A | Discharge status: Home / Self Care | Source: Ambulatory Visit | Attending: Anesthesiology | Admitting: Anesthesiology

## 2024-02-16 DIAGNOSIS — M4726 Other spondylosis with radiculopathy, lumbar region: Secondary | ICD-10-CM | POA: Diagnosis not present

## 2024-02-16 DIAGNOSIS — M545 Low back pain, unspecified: Secondary | ICD-10-CM

## 2024-02-17 ENCOUNTER — Ambulatory Visit (HOSPITAL_BASED_OUTPATIENT_CLINIC_OR_DEPARTMENT_OTHER): Payer: Medicare HMO | Admitting: Physical Therapy

## 2024-02-17 DIAGNOSIS — R2689 Other abnormalities of gait and mobility: Secondary | ICD-10-CM

## 2024-02-17 DIAGNOSIS — R262 Difficulty in walking, not elsewhere classified: Secondary | ICD-10-CM

## 2024-02-17 DIAGNOSIS — R293 Abnormal posture: Secondary | ICD-10-CM | POA: Diagnosis not present

## 2024-02-17 DIAGNOSIS — M6281 Muscle weakness (generalized): Secondary | ICD-10-CM | POA: Diagnosis not present

## 2024-02-17 DIAGNOSIS — S82852D Displaced trimalleolar fracture of left lower leg, subsequent encounter for closed fracture with routine healing: Secondary | ICD-10-CM | POA: Diagnosis not present

## 2024-02-17 DIAGNOSIS — M5459 Other low back pain: Secondary | ICD-10-CM

## 2024-02-17 DIAGNOSIS — M25572 Pain in left ankle and joints of left foot: Secondary | ICD-10-CM | POA: Diagnosis not present

## 2024-02-17 DIAGNOSIS — M25672 Stiffness of left ankle, not elsewhere classified: Secondary | ICD-10-CM | POA: Diagnosis not present

## 2024-02-17 NOTE — Therapy (Signed)
 OUTPATIENT PHYSICAL THERAPY LOWER EXTREMITY TREATMENT AND RE-CERT      Patient Name: Haley White MRN: [id number] DOB:1956/09/30, 68 y.o., female Today's Date: 02/17/2024  END OF SESSION:  PT End of Session - 02/17/24 1102     Visit Number 18    Number of Visits 21    Date for PT Re-Evaluation 03/30/24    Authorization Type [employer]et[employer] mcr    PT Start Time 1102    PT Stop Time 1145    PT Time Calculation (min) 43 min    Activity Tolerance Patient limited by pain    Behavior During Therapy WFL for tasks assessed/performed               Past Medical History:  Diagnosis Date   Anxiety    Arthritis    Asthma    Cataract    Depression    Gout    Hyperlipidemia    Hypertension    Obese    Sleep apnea    Past Surgical History:  Procedure Laterality Date   ANKLE SURGERY Left    APPENDECTOMY  1984   APPENDECTOMY     CATARACT EXTRACTION W/ INTRAOCULAR LENS  IMPLANT, BILATERAL  2014   CESAREAN SECTION  1984, 1987, 1994   X3   CESAREAN SECTION     x3   COLONOSCOPY  11/16/2013   w/Brodie    EXTERNAL FIXATION LEG Left 06/04/2023   Procedure: EXTERNAL FIXATION LEG;  Surgeon: Ali Ink, MD;  Location: [facility];  Service: Orthopedics;  Laterality: Left;   EYE SURGERY Bilateral    lasix   FOOT FRACTURE SURGERY Left    FOOT SURGERY Left 2002   FRACTURE SURGERY Left    left ankle   HARDWARE REMOVAL Left 12/03/2023   Procedure: HARDWARE REMOVAL OF SYNDEMOSIS;  Surgeon: Ali Ink, MD;  Location: [facility];  Service: Orthopedics;  Laterality: Left;   KNEE SURGERY Left 1998   ORIF ANKLE FRACTURE Left 06/04/2023   Procedure: OPEN REDUCTION INTERNAL FIXATION (ORIF) ANKLE FRACTURE vs EX FIX;  Surgeon: Ali Ink, MD;  Location: [facility];  Service: Orthopedics;  Laterality: Left;   ROTATOR CUFF REPAIR Right 2007   shoulder     torn rotator cuff   TUBAL LIGATION  1994   TUBAL LIGATION     Patient Active Problem List   Diagnosis Date Noted    OSA on CPAP 08/11/2023   Ankle fracture 06/03/2023   Trimalleolar fracture of left ankle 06/02/2023   Acute left ankle pain 06/02/2023   Sleep-related hypoxia 03/24/2023   At risk for obstructive sleep apnea 03/24/2023   Acute pulmonary embolism (HCC) 05/10/2022   Allergic rhinitis 03/15/2021   Gastro-esophageal reflux disease without esophagitis 03/15/2021   Mild persistent asthma, uncomplicated 03/15/2021   Aortic atherosclerosis (HCC) by Chest CT scan on 02/02/2018  01/10/2021   Asthma    Low back pain 03/17/2019   Lumbosacral spondylosis without myelopathy 03/17/2019   Pseudogout 12/09/2017   Major depressive disorder, recurrent, severe without psychotic features (HCC)    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   Suicide attempt by drug ingestion (HCC)    Vitamin D deficiency 08/22/2014   Hyperlipidemia, mixed 08/22/2014   Hypertension    Anxiety    Abnormal glucose    Class 2 severe obesity due to excess calories with serious comorbidity and body mass index (BMI) of 37.0 to 37.9 in adult [facility])     PCP: Vangie Genet MD  REFERRING PROVIDER: Ali Ink, MD (for  ankle) Ceasar Lund, MD (for low back)  REFERRING DIAG: S82.852D (ICD-10-CM) - Displaced trimalleolar fracture of left lower leg, subsequent encounter for closed fracture with routine healing   THERAPY DIAG:  Pain in left ankle and joints of left foot  Stiffness of left ankle, not elsewhere classified  Muscle weakness (generalized)  Difficulty in walking, not elsewhere classified  Closed displaced trimalleolar fracture of left ankle with routine healing, subsequent encounter  Other low back pain  Other abnormalities of gait and mobility  Rationale for Evaluation and Treatment: Rehabilitation  ONSET DATE: July 2024  SUBJECTIVE:   SUBJECTIVE STATEMENT: Pt reports ankle has been aching from the rain. Pt got her back MRI results back and they found a bulging disc. Pt has had back for years but  exacerbated in the last 6 months. Standing and walking aggravate the back and can go up to 10/10. Feels she is unable to do this for more than 2 min.    PERTINENT HISTORY: The patient is s/p left ankle trimalleolar & syndesmosis ORIF, DOS: 06/05/2023.  Removal of deep hardware on 12/03/2023 Hx of foot/ankle surgery in 2022  Fibromyalgia, HTN, PE s/p covid in 2023 LBP with MRI (10/2022) evidence of chronic mild anterior wedge compression fracture of T12 and chronic anterior wedge compression deformity of L1   PAIN:  Are you having pain? Yes: NPRS scale: 3/10  Pain location:  L lateral ankle Pain description: dull  6-7/10  Pain location: bilat lower lumbar flanks radiates into R posterior hip Pain description: sharp ache   PRECAUTIONS: None   RED FLAGS: None   WEIGHT BEARING RESTRICTIONS: WBAT  FALLS:  Has patient fallen in last 6 months? Yes. Number of falls 1 fell down wet hill walking dog  LIVING ENVIRONMENT: Lives with: lives alone Lives in: House/apartment Stairs: Yes: External: 3 steps; none Has following equipment at home: Single point cane   OCCUPATION: retired   PLOF: Independent  PATIENT GOALS: walk normally and as pain free as able  NEXT MD VISIT: June 2025  OBJECTIVE:  Note: Objective measures were completed at Evaluation unless otherwise noted.  MRI IMPRESSION: 1. No acute findings or clear explanation for the patient's symptoms. Overall findings are similar to previous CT from 2020. 2. Interval development of a healed mild superior endplate compression deformity at L1 since 2020. No acute osseous findings. 3. Multilevel spondylosis as described, most advanced at L4-5 where there is mild spinal stenosis and asymmetric lateral recess and foraminal narrowing on the left with suspected chronic left L4 nerve root encroachment. 4. Mild asymmetric lateral recess and foraminal narrowing on the left at L5-S1 without definite nerve root encroachment.    LOWER EXTREMITY ROM: 02/17/24: R ER more limited than L ER in prone   Active ROM Right eval Left eval Left  11/17/23 Right 2/28 Left 2/28 AROM/PROM Left 4/1  Hip flexion             Hip extension             Hip abduction             Hip adduction             Hip internal rotation             Hip external rotation             Knee flexion             Knee extension  Ankle dorsiflexion   -8 Lacking 2 14 Lacking 7 deg / 3  with pain 4 deg  Ankle plantarflexion   30d 42 65 55   Ankle inversion   Limited by 50%   43 24   Ankle eversion   Limited by 50%   10 2 with pain    (Blank rows = not tested)     LOWER EXTREMITY MMT:    MMT Right eval Left eval  Hip flexion 4 4  Hip extension 3 3  Hip abduction 3+ 3+  Hip adduction    Hip internal rotation    Hip external rotation    Knee flexion 5 5  Knee extension 5 5  Ankle dorsiflexion    Ankle plantarflexion    Ankle inversion    Ankle eversion     (Blank rows = not tested)  PALPATION:  Tenderness and hypertonicity notable in R>L lumbar paraspinals, L>R upper glutes  TODAY'S TREATMENT:  02/17/24 Prone on stomach x 3 min Prone press up on elbow x10 (centralized pt's pain) Prone hip abd x10 Supine figure 4 stretch x 30" Supine piriformis stretch x 30" Attempted bridges but hurt pt's back too much Heel press glute setting 2x10 Supine PPT + TSA activation 2x10  02/12/24 Nustep L4 x 3 min before aggravated back too much Heel slide x20 Supine calf stretch with strap x 30" Supine ankle PF/DF with knee bent 2x10 Supine toe yoga with knee bent 2x10 Supine towel scrunch 2x10 Supine towel ankle inv/ev 2x10 Supine knees bent, ankle DF red TB x10 knees straight ankle DF red TB x10 Supine ankle inv red TB 2x10 2 "I" strip ktape following peroneals   02/10/24 Seated heel/toe raises 2x10-12 Seated towel scrunches BAPS board 2x10 each f/b, cw, and ccw  Ankle DF and PF with YTB x 10 each  Pt received L ankle  DF, PF, Inv, and Eve      [facility] Adult PT Treatment:                                                DATE: 02/05/24 Therapeutic Exercise: Seated heel/toe raises 2x12 (modified to just heel raises after 5 reps due to dorsal discomfort) Yellow band hallux ext 3x5 rest breaks to mitigate fatigue  Neuromuscular re-ed: Wobbleboard DF emphasis on minimizing toe compensations 2x12 Airex weight shifts 2x6 w UE support   Self Care: Continued education re: relevant anatomy/physiology and rationale for interventions as pertains to symptom behavior, gradual progression of activity w/ monitoring for symptoms and appropriate modifications as needed   02/03/24 Reviewed pt presentation, HEP compliance, pain levels, and response to prior Rx.  Pt received L ankle DF, PF, Inv, and Eve PROM.  BAPS board 2x10 up/down, cw, and ccw Seated heel raises and toe raises 2x10 each Toe yoga x 10 reps Towel scrunches  Standing gastroc stretch at counter 2x30 sec Standing soleus stretch at counter approx 20 sec Sit to stands from table x10   [facility] Adult PT Treatment:                                                DATE: 01/29/24 Therapeutic Exercise: Seated heel/toe raises 3x10 Standing soleus stretch 3x30sec w/ UE support  cues for form and comfortable ROM Standing gastroc stretch 3x30sec w/ UE support cues for comfortable ROM HEP discussion/education Increased time w/ exercises to discuss relevant anatomy/physiology and rationale for interventions, observed compensations  Neuromuscular re-ed: Wobbleboard PF/DF 2x12 for ankle stability cues to reduce toe ext compensations with DF Deflated ball DF/PF rollout x15 cues for pacing and form    [facility] Adult PT Treatment:                                                DATE: 01/23/24 Therapeutic Exercise: Self ROM of L hallux 2x10 within comfortable ROM, cues for setup  Standing gastroc stretch at counter gently rocking within comfortable ROM x8 Standing soleus stretch  at counter, gentle rocking x5 (discontinued due to discomfort)  Ankle pumps x15 Seated calf stretch w/ strap 2x30sec HEP update + education/handout, increased time spent w/ education/discussion on relevant anatomy/physiology and rationale for interventions  Neuromuscular re-ed: NWB isolated hallux extension x15 NWB toe extension (hallux down) x15 Towel scrunch x15 cues for tripod     PATIENT EDUCATION:  Education details: rationale for interventions, HEP, exercise form, relevant anatomy.  Person educated: Patient Education method: Explanation, Demonstration, Tactile cues, Verbal cues Education comprehension: verbalized understanding, returned demonstration, verbal cues required, tactile cues required, and needs further education     HOME EXERCISE PROGRAM: Access Code: [email or web address] URL: [email or web address] Date: 01/23/2024 Prepared by: Fransisco Hertz  Exercises - Seated Ankle Alphabet  - 3 x daily - 7 x weekly - 1 sets - 10 reps - Supine Active Straight Leg Raise  - 1 x daily - 6-7 x weekly - 2 sets - 5-10 reps - Long Sitting Calf Stretch with Strap  - 2 x daily - 7 x weekly - 2-3 reps - 20 seconds hold - Seated Heel Raise  - 2 x daily - 7 x weekly - 1 sets - 10 reps - Seated Toe Raise  - 2 x daily - 7 x weekly - 1 sets - 10 reps - Toe Yoga - Alternating Great Toe and Lesser Toe Extension  - 2 x daily - 7 x weekly - 1 sets - 10 reps  ASSESSMENT:  CLINICAL IMPRESSION: Today's session focused on evaluating pt for her back pain. Assessment significant for gross bilat hip and core weakness. Pt with R>L lumbar paraspinal trigger points and L>R glute max trigger points. R hip mobility is more restricted vs L with ER/IR. Pt with radiating symptoms into R hip which improved with McKenzie lumbar ext techniques. Able to centralize symptoms by end of session. Initiated core and hip strengthening/stretching. Pt will benefit from PT to improve on her back pain to increase her  tolerance to standing and walking tasks which will further improve her rehab for her ankle.    OBJECTIVE IMPAIRMENTS: Abnormal gait, decreased activity tolerance, decreased balance, difficulty walking, decreased ROM, decreased strength, obesity, and pain.   ACTIVITY LIMITATIONS: squatting, stairs, and locomotion level  PARTICIPATION LIMITATIONS: shopping and community activity  PERSONAL FACTORS: Past/current experiences and 1 comorbidity: back dysfunction  are also affecting patient's functional outcome.   REHAB POTENTIAL: Good  CLINICAL DECISION MAKING: Evolving/moderate complexity  EVALUATION COMPLEXITY: Moderate   GOALS:  SHORT TERM GOALS: Target date: 11/24/23 Pt will tolerate full aquatic sessions consistently without increase in pain and with improving function to demonstrate good toleration and effectiveness of intervention.  Baseline: Goal status: Met 11/24/23  2.  Pt will navigate stair into and out of pool 6 steps using step through pattern Baseline: step-to pattern Goal status: In progress 11/10/23. Partially met 11/24/23  3.  Pt will report reduction in pain by 3 NPRS post aquatic session which lasts for up to 24 hours. Baseline:  Goal status: Met 11/24/23  4.  Pt will amb without antalgic gait pattern submerged in 3.6 ft Baseline:  Goal status:MET - 11/17/23  5.  Pt will demo improved quality of gait including increased toe off and stance time on L LE with reduced favoring of L LE. Goal status: INITIAL Target date:  01/23/2024  6.  Pt will demo improved L ankle AROM to at least 0 deg in DF and 7 deg in Eversion for improved stiffness and gait.  Goal status: INITIAL Target date:  01/23/2024     LONG TERM GOALS: Target date: 03/30/2024   Pt will demonstrate an improvement in LEFS by at least 9 points for a clinically significant improvement in self perceived disability.  Baseline: 30/80 Goal status: INITIAL  2.  Pt wiil navigate 1 flight of steps using step  alternating pattern indep Baseline:  Goal status: INITIAL  3.  Pt will demonstrate improved DF AROM to be w/n 5 deg of R ankle for improved stiffness, gait, and performance of stairs.  Baseline: see chart Goal status: INITIAL  4.  Pt will demo left ankle strength to be at least 4/5 in DF and WFL in PF (tested with manual resistance in open chain) in order for improved performance of and tolerance with functional mobility Baseline: see chart Goal status: INITIAL  5.  Pt will amb without AD without limitation to pain Baseline:using cane or walker  Goal status: INITIAL  6.  Pt will have improved Oswestry score to >/= 43/50 to demo MCID Baseline: Oswestry Score: 21 / 50 or 42 % Goal status: INITIAL  7.  Pt will be able to tolerate standing tasks for >/=15 min for household chores/tasks Baseline: 2 min Goal status: INITIAL  8.  Pt will be able to demo increased hip strength to at least 4/5 for improved standing tolerance Baseline: See MMT above Goal status: INITIAL   PLAN:  PT FREQUENCY: 2x/wk  PT DURATION: 6 weeks  PLANNED INTERVENTIONS: 97164- PT Re-evaluation, 97110-Therapeutic exercises, 97530- Therapeutic activity, 97112- Neuromuscular re-education, 97535- Self Care, 16109- Manual therapy, [phone]- Gait training, [phone]- Orthotic Fit/training, [phone]- Aquatic Therapy, Patient/Family education, Balance training, Stair training, Taping, Dry Needling, Joint mobilization, DME instructions, Cryotherapy, and Moist heat  PLAN FOR NEXT SESSION:  Review HEP and update if tolerable.  Cont with ankle ROM.  Gait training. Ankle stability training.  Continue gross hip and core strengthening. McKenzie techniques for lumbar.   Jailin Moomaw April Ma L Sharronda Schweers, [place], DPT 02/17/24 12:52 PM '

## 2024-02-19 ENCOUNTER — Encounter (HOSPITAL_BASED_OUTPATIENT_CLINIC_OR_DEPARTMENT_OTHER): Payer: Self-pay | Admitting: Physical Therapy

## 2024-02-19 ENCOUNTER — Ambulatory Visit (HOSPITAL_BASED_OUTPATIENT_CLINIC_OR_DEPARTMENT_OTHER): Payer: Medicare HMO | Admitting: Physical Therapy

## 2024-02-19 ENCOUNTER — Encounter: Payer: Self-pay | Admitting: Internal Medicine

## 2024-02-19 DIAGNOSIS — R293 Abnormal posture: Secondary | ICD-10-CM | POA: Diagnosis not present

## 2024-02-19 DIAGNOSIS — M25672 Stiffness of left ankle, not elsewhere classified: Secondary | ICD-10-CM

## 2024-02-19 DIAGNOSIS — M25572 Pain in left ankle and joints of left foot: Secondary | ICD-10-CM | POA: Diagnosis not present

## 2024-02-19 DIAGNOSIS — R2689 Other abnormalities of gait and mobility: Secondary | ICD-10-CM | POA: Diagnosis not present

## 2024-02-19 DIAGNOSIS — S82852D Displaced trimalleolar fracture of left lower leg, subsequent encounter for closed fracture with routine healing: Secondary | ICD-10-CM | POA: Diagnosis not present

## 2024-02-19 DIAGNOSIS — M5459 Other low back pain: Secondary | ICD-10-CM | POA: Diagnosis not present

## 2024-02-19 DIAGNOSIS — R262 Difficulty in walking, not elsewhere classified: Secondary | ICD-10-CM | POA: Diagnosis not present

## 2024-02-19 DIAGNOSIS — M6281 Muscle weakness (generalized): Secondary | ICD-10-CM

## 2024-02-19 NOTE — Therapy (Signed)
 OUTPATIENT PHYSICAL THERAPY LOWER EXTREMITY TREATMENT AND RE-CERT      Patient Name: Haley White MRN: [id number] DOB:28-Apr-1956, 68 y.o., female Today's Date: 02/19/2024  END OF SESSION:  PT End of Session - 02/19/24 1200     Visit Number 19    Number of Visits 21    Date for PT Re-Evaluation 03/30/24    Authorization Type aetna mcr    PT Start Time 1156    PT Stop Time 1239    PT Time Calculation (min) 43 min    Activity Tolerance Patient tolerated treatment well    Behavior During Therapy WFL for tasks assessed/performed               Past Medical History:  Diagnosis Date   Anxiety    Arthritis    Asthma    Cataract    Depression    Gout    Hyperlipidemia    Hypertension    Obese    Sleep apnea    Past Surgical History:  Procedure Laterality Date   ANKLE SURGERY Left    APPENDECTOMY  1984   APPENDECTOMY     CATARACT EXTRACTION W/ INTRAOCULAR LENS  IMPLANT, BILATERAL  2014   CESAREAN SECTION  1984, 1987, 1994   X3   CESAREAN SECTION     x3   COLONOSCOPY  11/16/2013   w/Brodie    EXTERNAL FIXATION LEG Left 06/04/2023   Procedure: EXTERNAL FIXATION LEG;  Surgeon: Ali Ink, MD;  Location: [facility];  Service: Orthopedics;  Laterality: Left;   EYE SURGERY Bilateral    lasix   FOOT FRACTURE SURGERY Left    FOOT SURGERY Left 2002   FRACTURE SURGERY Left    left ankle   HARDWARE REMOVAL Left 12/03/2023   Procedure: HARDWARE REMOVAL OF SYNDEMOSIS;  Surgeon: Ali Ink, MD;  Location: [facility];  Service: Orthopedics;  Laterality: Left;   KNEE SURGERY Left 1998   ORIF ANKLE FRACTURE Left 06/04/2023   Procedure: OPEN REDUCTION INTERNAL FIXATION (ORIF) ANKLE FRACTURE vs EX FIX;  Surgeon: Ali Ink, MD;  Location: [facility];  Service: Orthopedics;  Laterality: Left;   ROTATOR CUFF REPAIR Right 2007   shoulder     torn rotator cuff   TUBAL LIGATION  1994   TUBAL LIGATION     Patient Active Problem List   Diagnosis Date  Noted   OSA on CPAP 08/11/2023   Ankle fracture 06/03/2023   Trimalleolar fracture of left ankle 06/02/2023   Acute left ankle pain 06/02/2023   Sleep-related hypoxia 03/24/2023   At risk for obstructive sleep apnea 03/24/2023   Acute pulmonary embolism (HCC) 05/10/2022   Allergic rhinitis 03/15/2021   Gastro-esophageal reflux disease without esophagitis 03/15/2021   Mild persistent asthma, uncomplicated 03/15/2021   Aortic atherosclerosis (HCC) by Chest CT scan on 02/02/2018  01/10/2021   Asthma    Low back pain 03/17/2019   Lumbosacral spondylosis without myelopathy 03/17/2019   Pseudogout 12/09/2017   Major depressive disorder, recurrent, severe without psychotic features (HCC)    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   Suicide attempt by drug ingestion (HCC)    Vitamin D deficiency 08/22/2014   Hyperlipidemia, mixed 08/22/2014   Hypertension    Anxiety    Abnormal glucose    Class 2 severe obesity due to excess calories with serious comorbidity and body mass index (BMI) of 37.0 to 37.9 in adult [facility])     PCP: Vangie Genet MD  REFERRING PROVIDER: Ali Ink, MD (for  ankle) Ceasar Lund, MD (for low back)  REFERRING DIAG: S82.852D (ICD-10-CM) - Displaced trimalleolar fracture of left lower leg, subsequent encounter for closed fracture with routine healing   THERAPY DIAG:  Other low back pain  Pain in left ankle and joints of left foot  Stiffness of left ankle, not elsewhere classified  Muscle weakness (generalized)  Difficulty in walking, not elsewhere classified  Rationale for Evaluation and Treatment: Rehabilitation  ONSET DATE: July 2024  SUBJECTIVE:   SUBJECTIVE STATEMENT: Pt states her back is feeling better.  She felt much better after prior Rx.  She states she is encouraged after feeling better after 1 visit.  Pt states she walked into clinic today without even thinking about her back hurting.  Pt states she did her exercises yesterday.  Pt  got her back MRI results back and they found a bulging disc. Pt is very limited in standing.  Pt states she didn't need any oxy yesterday which was the first time in a while.  Pt states she has atrophy in her back.     PERTINENT HISTORY: The patient is s/p left ankle trimalleolar & syndesmosis ORIF, DOS: 06/05/2023.  Removal of deep hardware on 12/03/2023 Hx of foot/ankle surgery in 2022  Fibromyalgia, HTN, PE s/p covid in 2023 LBP with MRI (10/2022) evidence of chronic mild anterior wedge compression fracture of T12 and chronic anterior wedge compression deformity of L1   PAIN:  Are you having pain? Yes: NPRS scale: 3-4/10  Pain location:  L lateral ankle Pain description: sharp ache  2-3/10  Pain location: bilat lower lumbar flanks radiates into R posterior hip Pain description: ache   PRECAUTIONS: None   RED FLAGS: None   WEIGHT BEARING RESTRICTIONS: WBAT  FALLS:  Has patient fallen in last 6 months? Yes. Number of falls 1 fell down wet hill walking dog  LIVING ENVIRONMENT: Lives with: lives alone Lives in: House/apartment Stairs: Yes: External: 3 steps; none Has following equipment at home: Single point cane   OCCUPATION: retired   PLOF: Independent  PATIENT GOALS: walk normally and as pain free as able  NEXT MD VISIT: June 2025  OBJECTIVE:  Note: Objective measures were completed at Evaluation unless otherwise noted.  MRI IMPRESSION: 1. No acute findings or clear explanation for the patient's symptoms. Overall findings are similar to previous CT from 2020. 2. Interval development of a healed mild superior endplate compression deformity at L1 since 2020. No acute osseous findings. 3. Multilevel spondylosis as described, most advanced at L4-5 where there is mild spinal stenosis and asymmetric lateral recess and foraminal narrowing on the left with suspected chronic left L4 nerve root encroachment. 4. Mild asymmetric lateral recess and foraminal narrowing on  the left at L5-S1 without definite nerve root encroachment.     TODAY'S TREATMENT:  02/19/24 Reviewed response to prior Rx, pt presentation, pain level, and HEP compliance. Prone lying x 3 mins Supine piriformis stretch 3x30 sec bilat PT educated pt in correct form and palpation of TrA contraction and pt performed TrA contraction in supine. Supine heel slides with TrA 2x5 Ankle DF and PF with YTB 2x10 each Seated BAPS board approx 15 cw and ccw  See below for pt education  02/17/24 Prone on stomach x 3 min Prone press up on elbow x10 (centralized pt's pain) Prone hip abd x10 Supine figure 4 stretch x 30" Supine piriformis stretch x 30" Attempted bridges but hurt pt's back too much Heel press glute setting 2x10 Supine PPT + TSA  activation 2x10  02/12/24 Nustep L4 x 3 min before aggravated back too much Heel slide x20 Supine calf stretch with strap x 30" Supine ankle PF/DF with knee bent 2x10 Supine toe yoga with knee bent 2x10 Supine towel scrunch 2x10 Supine towel ankle inv/ev 2x10 Supine knees bent, ankle DF red TB x10 knees straight ankle DF red TB x10 Supine ankle inv red TB 2x10 2 "I" strip ktape following peroneals   02/10/24 Seated heel/toe raises 2x10-12 Seated towel scrunches BAPS board 2x10 each f/b, cw, and ccw  Ankle DF and PF with YTB x 10 each  Pt received L ankle DF, PF, Inv, and Eve      [facility] Adult PT Treatment:                                                DATE: 02/05/24 Therapeutic Exercise: Seated heel/toe raises 2x12 (modified to just heel raises after 5 reps due to dorsal discomfort) Yellow band hallux ext 3x5 rest breaks to mitigate fatigue  Neuromuscular re-ed: Wobbleboard DF emphasis on minimizing toe compensations 2x12 Airex weight shifts 2x6 w UE support   Self Care: Continued education re: relevant anatomy/physiology and rationale for interventions as pertains to symptom behavior, gradual progression of activity w/ monitoring for  symptoms and appropriate modifications as needed   02/03/24 Reviewed pt presentation, HEP compliance, pain levels, and response to prior Rx.  Pt received L ankle DF, PF, Inv, and Eve PROM.  BAPS board 2x10 up/down, cw, and ccw Seated heel raises and toe raises 2x10 each Toe yoga x 10 reps Towel scrunches  Standing gastroc stretch at counter 2x30 sec Standing soleus stretch at counter approx 20 sec Sit to stands from table x10   [facility] Adult PT Treatment:                                                DATE: 01/29/24 Therapeutic Exercise: Seated heel/toe raises 3x10 Standing soleus stretch 3x30sec w/ UE support cues for form and comfortable ROM Standing gastroc stretch 3x30sec w/ UE support cues for comfortable ROM HEP discussion/education Increased time w/ exercises to discuss relevant anatomy/physiology and rationale for interventions, observed compensations  Neuromuscular re-ed: Wobbleboard PF/DF 2x12 for ankle stability cues to reduce toe ext compensations with DF Deflated ball DF/PF rollout x15 cues for pacing and form     PATIENT EDUCATION:  Education details:  PT educated pt concerning spinal anatomy including vertebral disc and how it relates with her prone exercises.  PT used a spinal model.  PT instructed pt to stop exercise if she has increased pain or radicular sx's.  rationale for interventions, HEP, exercise form, and POC.  Person educated: Patient Education method: Explanation, Demonstration, Tactile cues, Verbal cues Education comprehension: verbalized understanding, returned demonstration, verbal cues required, tactile cues required, and needs further education     HOME EXERCISE PROGRAM: Access Code: [email or web address] URL: [email or web address] Date: 01/23/2024 Prepared by: Mayme Spearman   ASSESSMENT:  CLINICAL IMPRESSION: Pt entered clinic without cane and presents to Rx feeling much better.  Pt states she felt much better after prior Rx and her back is  still feeling much better currently.  Pt states she felt a little more uncomfortable with prone lying  today compared to prior Rx though had no increased pain.  Pt denies any radicular sx's with prone lying.  PT spent time educating pt concerning spinal anatomy including vertebral discs and how it relates with her prone exercises.  PT also educate d pt in appropriate response and instructed her stop any exercise if she has increased pain or radicular sx's.  Pt performed exercises well with cuing and instruction in correct form.  Pt required instruction for correct form in TrA contractions and demonstrates improved form with increased repetitions and cuing.  Pt appeared to have improved DF ROM with resisted YTB.  She responded well to Rx having no increased pain after Rx.  Pt should benefit from cont skilled PT addressing ankle and lumbar for improved pain, strength, mobility, and function.      OBJECTIVE IMPAIRMENTS: Abnormal gait, decreased activity tolerance, decreased balance, difficulty walking, decreased ROM, decreased strength, obesity, and pain.   ACTIVITY LIMITATIONS: squatting, stairs, and locomotion level  PARTICIPATION LIMITATIONS: shopping and community activity  PERSONAL FACTORS: Past/current experiences and 1 comorbidity: back dysfunction  are also affecting patient's functional outcome.   REHAB POTENTIAL: Good  CLINICAL DECISION MAKING: Evolving/moderate complexity  EVALUATION COMPLEXITY: Moderate   GOALS:  SHORT TERM GOALS: Target date: 11/24/23 Pt will tolerate full aquatic sessions consistently without increase in pain and with improving function to demonstrate good toleration and effectiveness of intervention.  Baseline: Goal status: Met 11/24/23  2.  Pt will navigate stair into and out of pool 6 steps using step through pattern Baseline: step-to pattern Goal status: In progress 11/10/23. Partially met 11/24/23  3.  Pt will report reduction in pain by 3 NPRS post aquatic  session which lasts for up to 24 hours. Baseline:  Goal status: Met 11/24/23  4.  Pt will amb without antalgic gait pattern submerged in 3.6 ft Baseline:  Goal status:MET - 11/17/23  5.  Pt will demo improved quality of gait including increased toe off and stance time on L LE with reduced favoring of L LE. Goal status: INITIAL Target date:  01/23/2024  6.  Pt will demo improved L ankle AROM to at least 0 deg in DF and 7 deg in Eversion for improved stiffness and gait.  Goal status: INITIAL Target date:  01/23/2024     LONG TERM GOALS: Target date: 03/30/2024   Pt will demonstrate an improvement in LEFS by at least 9 points for a clinically significant improvement in self perceived disability.  Baseline: 30/80 Goal status: INITIAL  2.  Pt wiil navigate 1 flight of steps using step alternating pattern indep Baseline:  Goal status: INITIAL  3.  Pt will demonstrate improved DF AROM to be w/n 5 deg of R ankle for improved stiffness, gait, and performance of stairs.  Baseline: see chart Goal status: INITIAL  4.  Pt will demo left ankle strength to be at least 4/5 in DF and WFL in PF (tested with manual resistance in open chain) in order for improved performance of and tolerance with functional mobility Baseline: see chart Goal status: INITIAL  5.  Pt will amb without AD without limitation to pain Baseline:using cane or walker  Goal status: INITIAL  6.  Pt will have improved Oswestry score to >/= 43/50 to demo MCID Baseline: Oswestry Score: 21 / 50 or 42 % Goal status: INITIAL  7.  Pt will be able to tolerate standing tasks for >/=15 min for household chores/tasks Baseline: 2 min Goal status: INITIAL  8.  Pt  will be able to demo increased hip strength to at least 4/5 for improved standing tolerance Baseline: See MMT above Goal status: INITIAL   PLAN:  PT FREQUENCY: 2x/wk  PT DURATION: 6 weeks  PLANNED INTERVENTIONS: 97164- PT Re-evaluation, 97110-Therapeutic exercises,  97530- Therapeutic activity, 97112- Neuromuscular re-education, 97535- Self Care, 44010- Manual therapy, [phone]- Gait training, [phone]- Orthotic Fit/training, [phone]- Aquatic Therapy, Patient/Family education, Balance training, Stair training, Taping, Dry Needling, Joint mobilization, DME instructions, Cryotherapy, and Moist heat  PLAN FOR NEXT SESSION:  Review HEP and update if tolerable.  Cont with ankle ROM.  Gait training. Ankle stability training.  Continue gross hip and core strengthening. McKenzie techniques for lumbar.   Marnie Siren, PT, DPT 02/19/24 12:51 PM '

## 2024-02-23 ENCOUNTER — Ambulatory Visit (HOSPITAL_BASED_OUTPATIENT_CLINIC_OR_DEPARTMENT_OTHER)

## 2024-02-23 ENCOUNTER — Encounter (HOSPITAL_BASED_OUTPATIENT_CLINIC_OR_DEPARTMENT_OTHER): Payer: Self-pay

## 2024-02-23 DIAGNOSIS — M6281 Muscle weakness (generalized): Secondary | ICD-10-CM

## 2024-02-23 DIAGNOSIS — M25572 Pain in left ankle and joints of left foot: Secondary | ICD-10-CM | POA: Diagnosis not present

## 2024-02-23 DIAGNOSIS — M25672 Stiffness of left ankle, not elsewhere classified: Secondary | ICD-10-CM | POA: Diagnosis not present

## 2024-02-23 DIAGNOSIS — R262 Difficulty in walking, not elsewhere classified: Secondary | ICD-10-CM | POA: Diagnosis not present

## 2024-02-23 DIAGNOSIS — S82852D Displaced trimalleolar fracture of left lower leg, subsequent encounter for closed fracture with routine healing: Secondary | ICD-10-CM | POA: Diagnosis not present

## 2024-02-23 DIAGNOSIS — R2689 Other abnormalities of gait and mobility: Secondary | ICD-10-CM | POA: Diagnosis not present

## 2024-02-23 DIAGNOSIS — R293 Abnormal posture: Secondary | ICD-10-CM | POA: Diagnosis not present

## 2024-02-23 DIAGNOSIS — M5459 Other low back pain: Secondary | ICD-10-CM | POA: Diagnosis not present

## 2024-02-23 NOTE — Therapy (Addendum)
 OUTPATIENT PHYSICAL THERAPY LOWER EXTREMITY TREATMENT    Progress Note Reporting Period 01/02/24 to 02/13/24  See note below for Objective Data and Assessment of Progress/Goals.       Patient Name: Haley White MRN: [id number] DOB:1956-07-21, 68 y.o., female Today's Date: 02/23/2024  END OF SESSION:  PT End of Session - 02/23/24 1524     Visit Number 20    Number of Visits 32    Date for PT Re-Evaluation 04/19/24    Authorization Type [employer] mcr    Progress Note Due on Visit 19    PT Start Time 1518    PT Stop Time 1601    PT Time Calculation (min) 43 min    Activity Tolerance Patient tolerated treatment well    Behavior During Therapy WFL for tasks assessed/performed                Past Medical History:  Diagnosis Date   Anxiety    Arthritis    Asthma    Cataract    Depression    Gout    Hyperlipidemia    Hypertension    Obese    Sleep apnea    Past Surgical History:  Procedure Laterality Date   ANKLE SURGERY Left    APPENDECTOMY  1984   APPENDECTOMY     CATARACT EXTRACTION W/ INTRAOCULAR LENS  IMPLANT, BILATERAL  2014   CESAREAN SECTION  1984, 1987, 1994   X3   CESAREAN SECTION     x3   COLONOSCOPY  11/16/2013   w/Brodie    EXTERNAL FIXATION LEG Left 06/04/2023   Procedure: EXTERNAL FIXATION LEG;  Surgeon: Ali Ink, MD;  Location: [facility];  Service: Orthopedics;  Laterality: Left;   EYE SURGERY Bilateral    lasix   FOOT FRACTURE SURGERY Left    FOOT SURGERY Left 2002   FRACTURE SURGERY Left    left ankle   HARDWARE REMOVAL Left 12/03/2023   Procedure: HARDWARE REMOVAL OF SYNDEMOSIS;  Surgeon: Ali Ink, MD;  Location: [facility];  Service: Orthopedics;  Laterality: Left;   KNEE SURGERY Left 1998   ORIF ANKLE FRACTURE Left 06/04/2023   Procedure: OPEN REDUCTION INTERNAL FIXATION (ORIF) ANKLE FRACTURE vs EX FIX;  Surgeon: Ali Ink, MD;  Location: [facility];  Service: Orthopedics;  Laterality: Left;    ROTATOR CUFF REPAIR Right 2007   shoulder     torn rotator cuff   TUBAL LIGATION  1994   TUBAL LIGATION     Patient Active Problem List   Diagnosis Date Noted   OSA on CPAP 08/11/2023   Ankle fracture 06/03/2023   Trimalleolar fracture of left ankle 06/02/2023   Acute left ankle pain 06/02/2023   Sleep-related hypoxia 03/24/2023   At risk for obstructive sleep apnea 03/24/2023   Acute pulmonary embolism (HCC) 05/10/2022   Allergic rhinitis 03/15/2021   Gastro-esophageal reflux disease without esophagitis 03/15/2021   Mild persistent asthma, uncomplicated 03/15/2021   Aortic atherosclerosis (HCC) by Chest CT scan on 02/02/2018  01/10/2021   Asthma    Low back pain 03/17/2019   Lumbosacral spondylosis without myelopathy 03/17/2019   Pseudogout 12/09/2017   Major depressive disorder, recurrent, severe without psychotic features (HCC)    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   Suicide attempt by drug ingestion (HCC)    Vitamin D  deficiency 08/22/2014   Hyperlipidemia, mixed 08/22/2014   Hypertension    Anxiety    Abnormal glucose    Class 2 severe obesity due to excess calories  with serious comorbidity and body mass index (BMI) of 37.0 to 37.9 in adult [facility])     PCP: Vangie Genet MD  REFERRING PROVIDER: Ali Ink, MD (for ankle) Bettejane Brownie, MD (for low back)  REFERRING DIAG: S82.852D (ICD-10-CM) - Displaced trimalleolar fracture of left lower leg, subsequent encounter for closed fracture with routine healing   THERAPY DIAG:  Other low back pain  Pain in left ankle and joints of left foot  Stiffness of left ankle, not elsewhere classified  Muscle weakness (generalized)  Rationale for Evaluation and Treatment: Rehabilitation  ONSET DATE: July 2024  SUBJECTIVE:   SUBJECTIVE STATEMENT: Pt reports " My ankle is doing better than my back." Pt reports she has appt with spine MD this Wednesday to review MRI with pt. 6/10 pain level in back at entry. 3/10  pain in L ankle. Has R LE radiating pain into R knee.    PERTINENT HISTORY: The patient is s/p left ankle trimalleolar & syndesmosis ORIF, DOS: 06/05/2023.  Removal of deep hardware on 12/03/2023 Hx of foot/ankle surgery in 2022  Fibromyalgia, HTN, PE s/p covid in 2023 LBP with MRI (10/2022) evidence of chronic mild anterior wedge compression fracture of T12 and chronic anterior wedge compression deformity of L1   PAIN:  Are you having pain? Yes: NPRS scale: 3/10  Pain location:  L lateral ankle Pain description: sharp ache  6/10  Pain location: bilat lower lumbar flanks radiates into R posterior hip Pain description: ache   PRECAUTIONS: None   RED FLAGS: None   WEIGHT BEARING RESTRICTIONS: WBAT  FALLS:  Has patient fallen in last 6 months? Yes. Number of falls 1 fell down wet hill walking dog  LIVING ENVIRONMENT: Lives with: lives alone Lives in: House/apartment Stairs: Yes: External: 3 steps; none Has following equipment at home: Single point cane   OCCUPATION: retired   PLOF: Independent  PATIENT GOALS: walk normally and as pain free as able  NEXT MD VISIT: June 2025  OBJECTIVE:  Note: Objective measures were completed at Evaluation unless otherwise noted.  MRI IMPRESSION: 1. No acute findings or clear explanation for the patient's symptoms. Overall findings are similar to previous CT from 2020. 2. Interval development of a healed mild superior endplate compression deformity at L1 since 2020. No acute osseous findings. 3. Multilevel spondylosis as described, most advanced at L4-5 where there is mild spinal stenosis and asymmetric lateral recess and foraminal narrowing on the left with suspected chronic left L4 nerve root encroachment. 4. Mild asymmetric lateral recess and foraminal narrowing on the left at L5-S1 without definite nerve root encroachment.   LOWER EXTREMITY ROM:   Active ROM Right eval Left eval Left  11/17/23 Right 2/28  Left 2/28 AROM/PROM Left 4/21 AROM   Hip flexion             Hip extension             Hip abduction             Hip adduction             Hip internal rotation             Hip external rotation             Knee flexion             Knee extension             Ankle dorsiflexion   -8 Lacking 2 14 Lacking 7 deg / 3  with pain Lacking 1 degree/pain with AAROM   Ankle plantarflexion   30d 42 65 55 65deg  Ankle inversion   Limited by 50%   43 24 12  Ankle eversion   Limited by 50%   10 2 with pain 12   (Blank rows = not tested)  LEFS 4/21: 14/80  TODAY'S TREATMENT:   02/23/24 Prone lying x10min Trialed prone on elbows, but painful after ~50seconds Prone hip extension x10 L LE, trialed on R but painful  -Supine piriformis stretch- attempted, but painful -LTR gentle -Hooklying PPT x15 Updated ankle ROM Seated HR/TR 2x10ea Standing gastroc stretch    02/19/24 Reviewed response to prior Rx, pt presentation, pain level, and HEP compliance. Prone lying x 3 mins Supine piriformis stretch 3x30 sec bilat PT educated pt in correct form and palpation of TrA contraction and pt performed TrA contraction in supine. Supine heel slides with TrA 2x5 Ankle DF and PF with YTB 2x10 each Seated BAPS board approx 15 cw and ccw  See below for pt education  02/17/24 Prone on stomach x 3 min Prone press up on elbow x10 (centralized pt's pain) Prone hip abd x10 Supine figure 4 stretch x 30" Supine piriformis stretch x 30" Attempted bridges but hurt pt's back too much Heel press glute setting 2x10 Supine PPT + TSA activation 2x10      PATIENT EDUCATION:  Education details:  PT educated pt concerning spinal anatomy including vertebral disc and how it relates with her prone exercises.  PT used a spinal model.  PT instructed pt to stop exercise if she has increased pain or radicular sx's.  rationale for interventions, HEP, exercise form, and POC.  Person educated: Patient Education method:  Explanation, Demonstration, Tactile cues, Verbal cues Education comprehension: verbalized understanding, returned demonstration, verbal cues required, tactile cues required, and needs further education     HOME EXERCISE PROGRAM: Access Code: [email or web address] URL: [email or web address] Date: 01/23/2024 Prepared by: Mayme Spearman   ASSESSMENT:  CLINICAL IMPRESSION:  Pt has attended 20 visits of PT thus far and has made steady progress with L ankle. She continues to rely on cane most of the time. Pain level has improved overall, but doe shave pain over bimalleolar region. Assessed ankle ROM today with improvements from previously measured.  Patient did have pain with passive overpressure and dorsiflexion. held strength testing due to high pain sensitivity at this time.  Worked on seated heel and toe raises with good tolerance.  Patient reports tenderness throughout calf and into Achilles.  Instructed patient in standing calf stretches to perform and discussed self massage to calf area using roller tool.  She needed assistance from family member with this due to ongoing back issues.  Pt limited in transfers due to LBP. Significant pain in lumbar region with prone to supine transfer.  Patient making good progress thus far.  Benefit from physical therapy to improve functional range of motion and strength in left ankle as well as balance and stability.  Patient [place] go [place] 1 [place].  Will try to schedule patient 1 more session of PT prior to this.    OBJECTIVE IMPAIRMENTS: Abnormal gait, decreased activity tolerance, decreased balance, difficulty walking, decreased ROM, decreased strength, obesity, and pain.   ACTIVITY LIMITATIONS: squatting, stairs, and locomotion level  PARTICIPATION LIMITATIONS: shopping and community activity  PERSONAL FACTORS: Past/current experiences and 1 comorbidity: back dysfunction  are also affecting patient's functional outcome.   REHAB  POTENTIAL: Good  CLINICAL DECISION MAKING:  Evolving/moderate complexity  EVALUATION COMPLEXITY: Moderate   GOALS:  SHORT TERM GOALS: Target date: 11/24/23 Pt will tolerate full aquatic sessions consistently without increase in pain and with improving function to demonstrate good toleration and effectiveness of intervention.  Baseline: Goal status: Met 11/24/23  2.  Pt will navigate stair into and out of pool 6 steps using step through pattern Baseline: step-to pattern Goal status: In progress 11/10/23. Partially met 11/24/23  3.  Pt will report reduction in pain by 3 NPRS post aquatic session which lasts for up to 24 hours. Baseline:  Goal status: Met 11/24/23  4.  Pt will amb without antalgic gait pattern submerged in 3.6 ft Baseline:  Goal status:MET - 11/17/23  5.  Pt will demo improved quality of gait including increased toe off and stance time on L LE with reduced favoring of L LE. Goal status: INITIAL Target date:  01/23/2024  6.  Pt will demo improved L ankle AROM to at least 0 deg in DF and 7 deg in Eversion for improved stiffness and gait.  Goal status: INITIAL Target date:  01/23/2024     LONG TERM GOALS: Target date:  04/19/2024    Pt will demonstrate an improvement in LEFS by at least 9 points for a clinically significant improvement in self perceived disability.  Baseline: 30/80 Goal status: INITIAL  2.  Pt wiil navigate 1 flight of steps using step alternating pattern indep Baseline:  Goal status: INITIAL  3.  Pt will demonstrate improved DF AROM to be w/n 5 deg of R ankle for improved stiffness, gait, and performance of stairs.  Baseline: see chart Goal status: IN PROGRESS 4/21  4.  Pt will demo left ankle strength to be at least 4/5 in DF and WFL in PF (tested with manual resistance in open chain) in order for improved performance of and tolerance with functional mobility Baseline: see chart Goal status: INITIAL  5.  Pt will amb without AD without  limitation to pain Baseline:using cane or walker  Goal status: IN PROGRESS (still using cane most   6.  Pt will have improved Oswestry score to >/= 43/50 to demo MCID Baseline: Oswestry Score: 21 / 50 or 42 % Goal status: INITIAL  7.  Pt will be able to tolerate standing tasks for >/=15 min for household chores/tasks Baseline: 2 min Goal status: INITIAL  8.  Pt will be able to demo increased hip strength to at least 4/5 for improved standing tolerance Baseline: See MMT above Goal status: INITIAL   PLAN:  PT FREQUENCY: 2x/wk  PT DURATION: 8 weeks  PLANNED INTERVENTIONS: 97164- PT Re-evaluation, 97110-Therapeutic exercises, 97530- Therapeutic activity, 97112- Neuromuscular re-education, 97535- Self Care, [id number]- Manual therapy, [phone]- Gait training, [phone]- Orthotic Fit/training, [phone]- Aquatic Therapy, Patient/Family education, Balance training, Stair training, Taping, Dry Needling, Joint mobilization, DME instructions, Cryotherapy, and Moist heat  PLAN FOR NEXT SESSION:  Review HEP and update if tolerable.  Cont with ankle ROM.  Gait training. Ankle stability training.  Continue gross hip and core strengthening. McKenzie techniques for lumbar.   Fronie Jewett Dalilah Curlin, PTA 02/23/24 5:31 PM   PT reviewed PN and extended POC. Trina Fujita III PT, DPT 02/28/24 5:51 PM

## 2024-02-24 DIAGNOSIS — F331 Major depressive disorder, recurrent, moderate: Secondary | ICD-10-CM | POA: Diagnosis not present

## 2024-02-25 DIAGNOSIS — M47816 Spondylosis without myelopathy or radiculopathy, lumbar region: Secondary | ICD-10-CM | POA: Diagnosis not present

## 2024-02-25 DIAGNOSIS — M791 Myalgia, unspecified site: Secondary | ICD-10-CM | POA: Diagnosis not present

## 2024-02-25 DIAGNOSIS — M533 Sacrococcygeal disorders, not elsewhere classified: Secondary | ICD-10-CM | POA: Diagnosis not present

## 2024-02-26 DIAGNOSIS — M533 Sacrococcygeal disorders, not elsewhere classified: Secondary | ICD-10-CM | POA: Diagnosis not present

## 2024-02-27 ENCOUNTER — Ambulatory Visit (HOSPITAL_BASED_OUTPATIENT_CLINIC_OR_DEPARTMENT_OTHER): Admitting: Physical Therapy

## 2024-02-27 DIAGNOSIS — R293 Abnormal posture: Secondary | ICD-10-CM

## 2024-02-27 DIAGNOSIS — M25672 Stiffness of left ankle, not elsewhere classified: Secondary | ICD-10-CM

## 2024-02-27 DIAGNOSIS — R2689 Other abnormalities of gait and mobility: Secondary | ICD-10-CM | POA: Diagnosis not present

## 2024-02-27 DIAGNOSIS — S82852D Displaced trimalleolar fracture of left lower leg, subsequent encounter for closed fracture with routine healing: Secondary | ICD-10-CM

## 2024-02-27 DIAGNOSIS — M5459 Other low back pain: Secondary | ICD-10-CM

## 2024-02-27 DIAGNOSIS — M25572 Pain in left ankle and joints of left foot: Secondary | ICD-10-CM

## 2024-02-27 DIAGNOSIS — R262 Difficulty in walking, not elsewhere classified: Secondary | ICD-10-CM | POA: Diagnosis not present

## 2024-02-27 DIAGNOSIS — M6281 Muscle weakness (generalized): Secondary | ICD-10-CM

## 2024-02-27 NOTE — Therapy (Signed)
 OUTPATIENT PHYSICAL THERAPY LOWER EXTREMITY TREATMENT       Patient Name: Haley White MRN: [id number] DOB:[birth year], 68 y.o., female Today's Date: 02/27/2024  END OF SESSION:  PT End of Session - 02/27/24 1018     Visit Number 21    Number of Visits 21    Date for PT Re-Evaluation 03/30/24    Authorization Type aetna mcr    Progress Note Due on Visit 19    PT Start Time 1018    PT Stop Time 1057    PT Time Calculation (min) 39 min    Activity Tolerance Patient tolerated treatment well    Behavior During Therapy WFL for tasks assessed/performed               Past Medical History:  Diagnosis Date   Anxiety    Arthritis    Asthma    Cataract    Depression    Gout    Hyperlipidemia    Hypertension    Obese    Sleep apnea    Past Surgical History:  Procedure Laterality Date   ANKLE SURGERY Left    APPENDECTOMY  1984   APPENDECTOMY     CATARACT EXTRACTION W/ INTRAOCULAR LENS  IMPLANT, BILATERAL  2014   CESAREAN SECTION  1984, 1987, 1994   X3   CESAREAN SECTION     x3   COLONOSCOPY  11/16/2013   w/Brodie    EXTERNAL FIXATION LEG Left 06/04/2023   Procedure: EXTERNAL FIXATION LEG;  Surgeon: Ali Ink, MD;  Location: [facility];  Service: Orthopedics;  Laterality: Left;   EYE SURGERY Bilateral    lasix   FOOT FRACTURE SURGERY Left    FOOT SURGERY Left 2002   FRACTURE SURGERY Left    left ankle   HARDWARE REMOVAL Left 12/03/2023   Procedure: HARDWARE REMOVAL OF SYNDEMOSIS;  Surgeon: Ali Ink, MD;  Location: [facility];  Service: Orthopedics;  Laterality: Left;   KNEE SURGERY Left 1998   ORIF ANKLE FRACTURE Left 06/04/2023   Procedure: OPEN REDUCTION INTERNAL FIXATION (ORIF) ANKLE FRACTURE vs EX FIX;  Surgeon: Ali Ink, MD;  Location: [facility];  Service: Orthopedics;  Laterality: Left;   ROTATOR CUFF REPAIR Right 2007   shoulder     torn rotator cuff   TUBAL LIGATION  1994   TUBAL LIGATION     Patient Active Problem  List   Diagnosis Date Noted   OSA on CPAP 08/11/2023   Ankle fracture 06/03/2023   Trimalleolar fracture of left ankle 06/02/2023   Acute left ankle pain 06/02/2023   Sleep-related hypoxia 03/24/2023   At risk for obstructive sleep apnea 03/24/2023   Acute pulmonary embolism (HCC) 05/10/2022   Allergic rhinitis 03/15/2021   Gastro-esophageal reflux disease without esophagitis 03/15/2021   Mild persistent asthma, uncomplicated 03/15/2021   Aortic atherosclerosis (HCC) by Chest CT scan on 02/02/2018  01/10/2021   Asthma    Low back pain 03/17/2019   Lumbosacral spondylosis without myelopathy 03/17/2019   Pseudogout 12/09/2017   Major depressive disorder, recurrent, severe without psychotic features (HCC)    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   Suicide attempt by drug ingestion (HCC)    Vitamin D  deficiency 08/22/2014   Hyperlipidemia, mixed 08/22/2014   Hypertension    Anxiety    Abnormal glucose    Class 2 severe obesity due to excess calories with serious comorbidity and body mass index (BMI) of 37.0 to 37.9 in adult [facility])     PCP: Vangie Genet  MD  REFERRING PROVIDER: Ali Ink, MD (for ankle) Bettejane Brownie, MD (for low back)  REFERRING DIAG: S82.852D (ICD-10-CM) - Displaced trimalleolar fracture of left lower leg, subsequent encounter for closed fracture with routine healing   THERAPY DIAG:  No diagnosis found.  Rationale for Evaluation and Treatment: Rehabilitation  ONSET DATE: July 2024  SUBJECTIVE:   SUBJECTIVE STATEMENT: Pt states she got a shot in her back and is already feeling better. Ankle is doing well. Will be leaving for [place] next week.    PERTINENT HISTORY: The patient is s/p left ankle trimalleolar & syndesmosis ORIF, DOS: 06/05/2023.  Removal of deep hardware on 12/03/2023 Hx of foot/ankle surgery in 2022  Fibromyalgia, HTN, PE s/p covid in 2023 LBP with MRI (10/2022) evidence of chronic mild anterior wedge compression fracture  of T12 and chronic anterior wedge compression deformity of L1   PAIN:  Are you having pain? Yes: NPRS scale: 3/10  Pain location:  L lateral ankle Pain description: sharp ache  6/10  Pain location: bilat lower lumbar flanks radiates into R posterior hip Pain description: ache   PRECAUTIONS: None   WEIGHT BEARING RESTRICTIONS: WBAT  FALLS:  Has patient fallen in last 6 months? Yes. Number of falls 1 fell down wet hill walking dog  LIVING ENVIRONMENT: Lives with: lives alone Lives in: House/apartment Stairs: Yes: External: 3 steps; none Has following equipment at home: Single point cane   OCCUPATION: retired   PLOF: Independent  PATIENT GOALS: walk normally and as pain free as able  NEXT MD VISIT: June 2025  OBJECTIVE:  Note: Objective measures were completed at Evaluation unless otherwise noted.  MRI IMPRESSION: 1. No acute findings or clear explanation for the patient's symptoms. Overall findings are similar to previous CT from 2020. 2. Interval development of a healed mild superior endplate compression deformity at L1 since 2020. No acute osseous findings. 3. Multilevel spondylosis as described, most advanced at L4-5 where there is mild spinal stenosis and asymmetric lateral recess and foraminal narrowing on the left with suspected chronic left L4 nerve root encroachment. 4. Mild asymmetric lateral recess and foraminal narrowing on the left at L5-S1 without definite nerve root encroachment.   LOWER EXTREMITY ROM:   Active ROM Right eval Left eval Left  11/17/23 Right 2/28 Left 2/28 AROM/PROM Left 4/21 AROM   Hip flexion             Hip extension             Hip abduction             Hip adduction             Hip internal rotation             Hip external rotation             Knee flexion             Knee extension             Ankle dorsiflexion   -8 Lacking 2 14 Lacking 7 deg / 3  with pain Lacking 1 degree/pain with AAROM   Ankle plantarflexion    30d 42 65 55 65deg  Ankle inversion   Limited by 50%   43 24 12  Ankle eversion   Limited by 50%   10 2 with pain 12   (Blank rows = not tested)  LEFS 4/21: 14/80  TODAY'S TREATMENT:  02/27/24 Nustep L5 x 6 min UEs/LEs Seated hamstring stretch  x 30" Seated hip flexor stretch x 30" Standing heel/toe raise x10 Seated ankle eversion green TB 2x10 Seated ankle inversion green TB 2x10 Seated pball press down 10x5" Seated pball press down opposite arm/leg 10x5" Palloff press attempted but could feel pull on low back Standing shoulder ext red TB in partial tandem stance 2x10  02/23/24 Prone lying x69min Trialed prone on elbows, but painful after ~50seconds Prone hip extension x10 L LE, trialed on R but painful  -Supine piriformis stretch- attempted, but painful -LTR gentle -Hooklying PPT x15 Updated ankle ROM Seated HR/TR 2x10ea Standing gastroc stretch    02/19/24 Reviewed response to prior Rx, pt presentation, pain level, and HEP compliance. Prone lying x 3 mins Supine piriformis stretch 3x30 sec bilat PT educated pt in correct form and palpation of TrA contraction and pt performed TrA contraction in supine. Supine heel slides with TrA 2x5 Ankle DF and PF with YTB 2x10 each Seated BAPS board approx 15 cw and ccw  See below for pt education  02/17/24 Prone on stomach x 3 min Prone press up on elbow x10 (centralized pt's pain) Prone hip abd x10 Supine figure 4 stretch x 30" Supine piriformis stretch x 30" Attempted bridges but hurt pt's back too much Heel press glute setting 2x10 Supine PPT + TSA activation 2x10      PATIENT EDUCATION:  Education details:  HEP updates Person educated: Patient Education method: Explanation, Demonstration, Tactile cues, Verbal cues Education comprehension: verbalized understanding, returned demonstration, verbal cues required, tactile cues required, and needs further education     HOME EXERCISE PROGRAM: Access Code: [email or web address] URL:  [email or web address] Date: 02/27/2024 Prepared by: Theran Vandergrift April Erman [place]  Exercises - Toe Yoga - Alternating Great Toe and Lesser Toe Extension  - 2 x daily - 7 x weekly - 1 sets - 10 reps - Lying Prone  - 1 x daily - 7 x weekly - 1 sets - 5 min hold - Prone Press Up On Elbows  - 1 x daily - 7 x weekly - 1-2 sets - 10 reps - Prone Hip Abduction on Slider  - 1 x daily - 7 x weekly - 2 sets - 10 reps - Supine Piriformis Stretch with Foot on Ground  - 1 x daily - 7 x weekly - 2 sets - 30 sec hold - Hooklying Gluteal Sets  - 1 x daily - 7 x weekly - 2 sets - 10 reps - Supine Transversus Abdominis Bracing - Hands on Stomach  - 1 x daily - 7 x weekly - 2 sets - 10 reps - 3-5 sec hold - Seated Ankle Eversion with Resistance  - 1 x daily - 7 x weekly - 2 sets - 10 reps - Seated Ankle Inversion with Resistance  - 1 x daily - 7 x weekly - 2 sets - 10 reps - Heel Toe Raises with Unilateral Counter Support  - 1 x daily - 7 x weekly - 2 sets - 10 reps - Shoulder extension with resistance - Neutral  - 1 x daily - 7 x weekly - 2 sets - 10 reps   ASSESSMENT:  CLINICAL IMPRESSION: Focused on progressing pt's ankle strength, ROM and stability this session. Worked on core and midback strength for postural stability with her daily tasks. Pt tolerated well. Back pain level was low this session after her injection. Will hold PT until pt comes back from her trip to [place].   OBJECTIVE IMPAIRMENTS: Abnormal gait, decreased activity tolerance, decreased balance, difficulty  walking, decreased ROM, decreased strength, obesity, and pain.   ACTIVITY LIMITATIONS: squatting, stairs, and locomotion level  PARTICIPATION LIMITATIONS: shopping and community activity  PERSONAL FACTORS: Past/current experiences and 1 comorbidity: back dysfunction  are also affecting patient's functional outcome.   REHAB POTENTIAL: Good  CLINICAL DECISION MAKING: Evolving/moderate complexity  EVALUATION COMPLEXITY:  Moderate   GOALS:  SHORT TERM GOALS: Target date: 11/24/23 Pt will tolerate full aquatic sessions consistently without increase in pain and with improving function to demonstrate good toleration and effectiveness of intervention.  Baseline: Goal status: Met 11/24/23  2.  Pt will navigate stair into and out of pool 6 steps using step through pattern Baseline: step-to pattern Goal status: In progress 11/10/23. Partially met 11/24/23  3.  Pt will report reduction in pain by 3 NPRS post aquatic session which lasts for up to 24 hours. Baseline:  Goal status: Met 11/24/23  4.  Pt will amb without antalgic gait pattern submerged in 3.6 ft Baseline:  Goal status:MET - 11/17/23  5.  Pt will demo improved quality of gait including increased toe off and stance time on L LE with reduced favoring of L LE. Goal status: INITIAL Target date:  01/23/2024  6.  Pt will demo improved L ankle AROM to at least 0 deg in DF and 7 deg in Eversion for improved stiffness and gait.  Goal status: INITIAL Target date:  01/23/2024     LONG TERM GOALS: Target date: 03/30/2024   Pt will demonstrate an improvement in LEFS by at least 9 points for a clinically significant improvement in self perceived disability.  Baseline: 30/80 Goal status: INITIAL  2.  Pt wiil navigate 1 flight of steps using step alternating pattern indep Baseline:  Goal status: INITIAL  3.  Pt will demonstrate improved DF AROM to be w/n 5 deg of R ankle for improved stiffness, gait, and performance of stairs.  Baseline: see chart Goal status: IN PROGRESS 4/21  4.  Pt will demo left ankle strength to be at least 4/5 in DF and WFL in PF (tested with manual resistance in open chain) in order for improved performance of and tolerance with functional mobility Baseline: see chart Goal status: INITIAL  5.  Pt will amb without AD without limitation to pain Baseline:using cane or walker  Goal status: IN PROGRESS (still using cane most   6.  Pt  will have improved Oswestry score to >/= 43/50 to demo MCID Baseline: Oswestry Score: 21 / 50 or 42 % Goal status: INITIAL  7.  Pt will be able to tolerate standing tasks for >/=15 min for household chores/tasks Baseline: 2 min Goal status: INITIAL  8.  Pt will be able to demo increased hip strength to at least 4/5 for improved standing tolerance Baseline: See MMT above Goal status: INITIAL   PLAN:  PT FREQUENCY: 2x/wk  PT DURATION: 6 weeks  PLANNED INTERVENTIONS: 97164- PT Re-evaluation, 97110-Therapeutic exercises, 97530- Therapeutic activity, 97112- Neuromuscular re-education, 97535- Self Care, 16109- Manual therapy, [phone]- Gait training, [phone]- Orthotic Fit/training, [phone]- Aquatic Therapy, Patient/Family education, Balance training, Stair training, Taping, Dry Needling, Joint mobilization, DME instructions, Cryotherapy, and Moist heat  PLAN FOR NEXT SESSION:  Review HEP and update if tolerable.  Cont with ankle ROM.  Gait training. Ankle stability training.  Continue gross hip and core strengthening. McKenzie techniques for lumbar.   Tonia Avino April Ma L Denora Wysocki, PT 02/27/24 10:19 AM '

## 2024-02-28 NOTE — Addendum Note (Signed)
 Addended by: Grier Leber on: 02/28/2024 05:56 PM   Modules accepted: Orders

## 2024-03-01 ENCOUNTER — Other Ambulatory Visit: Payer: Self-pay | Admitting: Family

## 2024-03-01 ENCOUNTER — Other Ambulatory Visit: Payer: Medicare HMO

## 2024-03-01 ENCOUNTER — Encounter (HOSPITAL_BASED_OUTPATIENT_CLINIC_OR_DEPARTMENT_OTHER): Payer: Self-pay | Admitting: Physical Therapy

## 2024-03-01 ENCOUNTER — Ambulatory Visit: Payer: Medicare HMO

## 2024-03-13 DIAGNOSIS — G4733 Obstructive sleep apnea (adult) (pediatric): Secondary | ICD-10-CM | POA: Diagnosis not present

## 2024-03-15 ENCOUNTER — Encounter (HOSPITAL_BASED_OUTPATIENT_CLINIC_OR_DEPARTMENT_OTHER): Payer: Self-pay | Admitting: Physical Therapy

## 2024-03-15 ENCOUNTER — Ambulatory Visit (HOSPITAL_BASED_OUTPATIENT_CLINIC_OR_DEPARTMENT_OTHER): Attending: Orthopaedic Surgery | Admitting: Physical Therapy

## 2024-03-15 DIAGNOSIS — M25572 Pain in left ankle and joints of left foot: Secondary | ICD-10-CM | POA: Diagnosis not present

## 2024-03-15 DIAGNOSIS — R262 Difficulty in walking, not elsewhere classified: Secondary | ICD-10-CM | POA: Insufficient documentation

## 2024-03-15 DIAGNOSIS — M6281 Muscle weakness (generalized): Secondary | ICD-10-CM | POA: Diagnosis not present

## 2024-03-15 DIAGNOSIS — M5459 Other low back pain: Secondary | ICD-10-CM | POA: Insufficient documentation

## 2024-03-15 DIAGNOSIS — M25672 Stiffness of left ankle, not elsewhere classified: Secondary | ICD-10-CM | POA: Insufficient documentation

## 2024-03-15 NOTE — Therapy (Signed)
 OUTPATIENT PHYSICAL THERAPY LOWER EXTREMITY TREATMENT    Progress Note Reporting Period 02/23/2024 to 03/15/24   See note below for Objective Data and Assessment of Progress/Goals.       Patient Name: Haley White MRN: [id number] DOB:[birth year], 68 y.o., female Today's Date: 03/15/2024  END OF SESSION:  PT End of Session - 03/15/24 0906     Visit Number 22    Number of Visits 32    Date for PT Re-Evaluation 06/07/24    Authorization Type [employer] mcr    Progress Note Due on Visit 19    PT Start Time [phone]   late check in   PT Stop Time 0926    PT Time Calculation (min) 35 min    Activity Tolerance Patient tolerated treatment well    Behavior During Therapy WFL for tasks assessed/performed                Past Medical History:  Diagnosis Date   Anxiety    Arthritis    Asthma    Cataract    Depression    Gout    Hyperlipidemia    Hypertension    Obese    Sleep apnea    Past Surgical History:  Procedure Laterality Date   ANKLE SURGERY Left    APPENDECTOMY  1984   APPENDECTOMY     CATARACT EXTRACTION W/ INTRAOCULAR LENS  IMPLANT, BILATERAL  2014   CESAREAN SECTION  1984, 1987, 1994   X3   CESAREAN SECTION     x3   COLONOSCOPY  11/16/2013   w/Brodie    EXTERNAL FIXATION LEG Left 06/04/2023   Procedure: EXTERNAL FIXATION LEG;  Surgeon: Ali Ink, MD;  Location: [facility];  Service: Orthopedics;  Laterality: Left;   EYE SURGERY Bilateral    lasix   FOOT FRACTURE SURGERY Left    FOOT SURGERY Left 2002   FRACTURE SURGERY Left    left ankle   HARDWARE REMOVAL Left 12/03/2023   Procedure: HARDWARE REMOVAL OF SYNDEMOSIS;  Surgeon: Ali Ink, MD;  Location: [facility];  Service: Orthopedics;  Laterality: Left;   KNEE SURGERY Left 1998   ORIF ANKLE FRACTURE Left 06/04/2023   Procedure: OPEN REDUCTION INTERNAL FIXATION (ORIF) ANKLE FRACTURE vs EX FIX;  Surgeon: Ali Ink, MD;  Location: [facility];  Service: Orthopedics;   Laterality: Left;   ROTATOR CUFF REPAIR Right 2007   shoulder     torn rotator cuff   TUBAL LIGATION  1994   TUBAL LIGATION     Patient Active Problem List   Diagnosis Date Noted   OSA on CPAP 08/11/2023   Ankle fracture 06/03/2023   Trimalleolar fracture of left ankle 06/02/2023   Acute left ankle pain 06/02/2023   Sleep-related hypoxia 03/24/2023   At risk for obstructive sleep apnea 03/24/2023   Acute pulmonary embolism (HCC) 05/10/2022   Allergic rhinitis 03/15/2021   Gastro-esophageal reflux disease without esophagitis 03/15/2021   Mild persistent asthma, uncomplicated 03/15/2021   Aortic atherosclerosis (HCC) by Chest CT scan on 02/02/2018  01/10/2021   Asthma    Low back pain 03/17/2019   Lumbosacral spondylosis without myelopathy 03/17/2019   Pseudogout 12/09/2017   Major depressive disorder, recurrent, severe without psychotic features (HCC)    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   Suicide attempt by drug ingestion (HCC)    Vitamin D  deficiency 08/22/2014   Hyperlipidemia, mixed 08/22/2014   Hypertension    Anxiety    Abnormal glucose    Class 2 severe  obesity due to excess calories with serious comorbidity and body mass index (BMI) of 37.0 to 37.9 in adult [facility])     PCP: Vangie Genet MD  REFERRING PROVIDER: Ali Ink, MD (for ankle) Bettejane Brownie, MD (for low back)  REFERRING DIAG: S82.852D (ICD-10-CM) - Displaced trimalleolar fracture of left lower leg, subsequent encounter for closed fracture with routine healing   THERAPY DIAG:  Other low back pain  Pain in left ankle and joints of left foot  Stiffness of left ankle, not elsewhere classified  Muscle weakness (generalized)  Difficulty in walking, not elsewhere classified  Rationale for Evaluation and Treatment: Rehabilitation  ONSET DATE: July 2024  SUBJECTIVE:   SUBJECTIVE STATEMENT: Pt just came [place]. The foot was  swollen and there was some bruising. Pt states she was walking around that day already when she then had that pop afterwards walking down hall. Pt states that the back pain has been "so-so." She was using her walker and still having pain. Still feels like sciatica on the R.    PERTINENT HISTORY: The patient is s/p left ankle trimalleolar & syndesmosis ORIF, DOS: 06/05/2023.  Removal of deep hardware on 12/03/2023 Hx of foot/ankle surgery in 2022  Fibromyalgia, HTN, PE s/p covid in 2023 LBP with MRI (10/2022) evidence of chronic mild anterior wedge compression fracture of T12 and chronic anterior wedge compression deformity of L1   PAIN:  Are you having pain? Yes: NPRS scale: 2/10  Pain location:  L top of the foot, L medial foot Pain description: sharp ache  6/10  Pain location: bilat lower lumbar flanks radiates into R posterior hip Pain description: ache   PRECAUTIONS: None   WEIGHT BEARING RESTRICTIONS: WBAT  FALLS:  Has patient fallen in last 6 months? Yes. Number of falls 1 fell down wet hill walking dog  LIVING ENVIRONMENT: Lives with: lives alone Lives in: House/apartment Stairs: Yes: External: 3 steps; none Has following equipment at home: Single point cane   OCCUPATION: retired   PLOF: Independent  PATIENT GOALS: walk normally and as pain free as able  NEXT MD VISIT: June 2025  OBJECTIVE:  Note: Objective measures were completed at Evaluation unless otherwise noted.  MRI IMPRESSION: 1. No acute findings or clear explanation for the patient's symptoms. Overall findings are similar to previous CT from 2020. 2. Interval development of a healed mild superior endplate compression deformity at L1 since 2020. No acute osseous findings. 3. Multilevel spondylosis as described, most advanced at L4-5 where there is mild spinal stenosis and asymmetric lateral recess and foraminal narrowing on the left with suspected chronic left L4 nerve root encroachment. 4. Mild  asymmetric lateral recess and foraminal narrowing on the left at L5-S1 without definite nerve root encroachment.   LOWER EXTREMITY ROM:   Active ROM Right eval Left eval Left  11/17/23 Right 2/28 Left 2/28 AROM/PROM Left 4/21 AROM  L 5/12   Hip flexion              Hip extension              Hip abduction              Hip adduction              Hip internal rotation              Hip external rotation  Knee flexion              Knee extension              Ankle dorsiflexion   -8 Lacking 2 14 Lacking 7 deg / 3  with pain Lacking 1 degree/pain with AAROM  4  Ankle plantarflexion   30d 42 65 55 65deg 65  Ankle inversion   Limited by 50%   43 24 12 17   Ankle eversion   Limited by 50%   10 2 with pain 12 5 pain   (Blank rows = not tested)  LEFS 4/21: 14/80 LEFS: Lower Extremity Functional Score: 30 / 80 = 37.5 % ODI Score = 17 points (34%)   MMT 4+ throughout L ankle with pain into EV  TODAY'S TREATMENT:   5/12  Edu of exam results, foot biomechanics and pain, footwear options, HEP modifications  Gait with heel lift 3 layers, focus on medial pressure at toe off Standing gastroc stretch 30s 3x   02/27/24 Nustep L5 x 6 min UEs/LEs Seated hamstring stretch x 30" Seated hip flexor stretch x 30" Standing heel/toe raise x10 Seated ankle eversion green TB 2x10 Seated ankle inversion green TB 2x10 Seated pball press down 10x5" Seated pball press down opposite arm/leg 10x5" Palloff press attempted but could feel pull on low back Standing shoulder ext red TB in partial tandem stance 2x10  02/23/24 Prone lying x18min Trialed prone on elbows, but painful after ~50seconds Prone hip extension x10 L LE, trialed on R but painful  -Supine piriformis stretch- attempted, but painful -LTR gentle -Hooklying PPT x15 Updated ankle ROM Seated HR/TR 2x10ea Standing gastroc stretch    02/19/24 Reviewed response to prior Rx, pt presentation, pain level, and HEP  compliance. Prone lying x 3 mins Supine piriformis stretch 3x30 sec bilat PT educated pt in correct form and palpation of TrA contraction and pt performed TrA contraction in supine. Supine heel slides with TrA 2x5 Ankle DF and PF with YTB 2x10 each Seated BAPS board approx 15 cw and ccw  See below for pt education  02/17/24 Prone on stomach x 3 min Prone press up on elbow x10 (centralized pt's pain) Prone hip abd x10 Supine figure 4 stretch x 30" Supine piriformis stretch x 30" Attempted bridges but hurt pt's back too much Heel press glute setting 2x10 Supine PPT + TSA activation 2x10      PATIENT EDUCATION:  Education details:  HEP updates Person educated: Patient Education method: Explanation, Demonstration, Tactile cues, Verbal cues Education comprehension: verbalized understanding, returned demonstration, verbal cues required, tactile cues required, and needs further education     HOME EXERCISE PROGRAM: Access Code: [email or web address] URL: [email or web address] Date: 02/27/2024 Prepared by: Gellen April Marie Nonato  Exercises - Toe Yoga - Alternating Great Toe and Lesser Toe Extension  - 2 x daily - 7 x weekly - 1 sets - 10 reps - Lying Prone  - 1 x daily - 7 x weekly - 1 sets - 5 min hold - Prone Press Up On Elbows  - 1 x daily - 7 x weekly - 1-2 sets - 10 reps - Prone Hip Abduction on Slider  - 1 x daily - 7 x weekly - 2 sets - 10 reps - Supine Piriformis Stretch with Foot on Ground  - 1 x daily - 7 x weekly - 2 sets - 30 sec hold - Hooklying Gluteal Sets  - 1 x daily - 7 x weekly - 2 sets -  10 reps - Supine Transversus Abdominis Bracing - Hands on Stomach  - 1 x daily - 7 x weekly - 2 sets - 10 reps - 3-5 sec hold - Seated Ankle Eversion with Resistance  - 1 x daily - 7 x weekly - 2 sets - 10 reps - Seated Ankle Inversion with Resistance  - 1 x daily - 7 x weekly - 2 sets - 10 reps - Heel Toe Raises with Unilateral Counter Support  - 1 x daily - 7 x weekly - 2  sets - 10 reps - Shoulder extension with resistance - Neutral  - 1 x daily - 7 x weekly - 2 sets - 10 reps   ASSESSMENT:  CLINICAL IMPRESSION: Pt with measurable improvements in L ankle at today's session. Focus placed on L ankle measures and edu due to recent injury while on vacation. Testing does suggest soft tissue injury most recently without significant bruising or swelling by now. Pt does have boney osteophyte growing at the tarsals which is painful with ankle DF. Pt does improve with strength and ROM but is still severely limited with gait and community ambulation due to her pain and ankle stiffness. Pt did improve gait quality with heel lifts. Pt is able to improve with STG and LTG but is still limited largely by pain. Plan to continue with POC as tolerated. Pt would benefit from continued skilled therapy in order to reach goals and maximize functional LE and lumbpelvic strength and ROM for improvement in community mobility and ADL.   OBJECTIVE IMPAIRMENTS: Abnormal gait, decreased activity tolerance, decreased balance, difficulty walking, decreased ROM, decreased strength, obesity, and pain.   ACTIVITY LIMITATIONS: squatting, stairs, and locomotion level  PARTICIPATION LIMITATIONS: shopping and community activity  PERSONAL FACTORS: Past/current experiences and 1 comorbidity: back dysfunction are also affecting patient's functional outcome.   REHAB POTENTIAL: Good  CLINICAL DECISION MAKING: Evolving/moderate complexity  EVALUATION COMPLEXITY: Moderate   GOALS:  SHORT TERM GOALS: Target date: 11/24/23 Pt will tolerate full aquatic sessions consistently without increase in pain and with improving function to demonstrate good toleration and effectiveness of intervention.  Baseline: Goal status: Met 11/24/23  2.  Pt will navigate stair into and out of pool 6 steps using step through pattern Baseline: step-to pattern Goal status: In progress 11/10/23. Partially met 11/24/23  3.  Pt  will report reduction in pain by 3 NPRS post aquatic session which lasts for up to 24 hours. Baseline:  Goal status: Met 11/24/23  4.  Pt will amb without antalgic gait pattern submerged in 3.6 ft Baseline:  Goal status:MET - 11/17/23  5.  Pt will demo improved quality of gait including increased toe off and stance time on L LE with reduced favoring of L LE. Goal status: ongoing Target date:  01/23/2024  6.  Pt will demo improved L ankle AROM to at least 0 deg in DF and 7 deg in Eversion for improved stiffness and gait.  Goal status: ongoing Target date:  01/23/2024     LONG TERM GOALS: Target date: 06/13/2024    Pt will demonstrate an improvement in LEFS by at least 9 points for a clinically significant improvement in self perceived disability.  Baseline: 30/80 Goal status: MET  2.  Pt wiil navigate 1 flight of steps using step alternating pattern indep Baseline:  Goal status: ongoing  3.  Pt will demonstrate improved DF AROM to be w/n 5 deg of R ankle for improved stiffness, gait, and performance of stairs.  Baseline: see chart Goal status: IN PROGRESS 4/21  4.  Pt will demo left ankle strength to be at least 4/5 in DF and WFL in PF (tested with manual resistance in open chain) in order for improved performance of and tolerance with functional mobility Baseline: see chart Goal status:  MET  5.  Pt will amb without AD without limitation to pain Baseline:using cane or walker  Goal status: IN PROGRESS (still using cane most   6.  Pt will have improved Oswestry score to >/= 43/50 to demo MCID Baseline: Oswestry Score: 21 / 50 or 42 % Goal status: ongoing  7.  Pt will be able to tolerate standing tasks for >/=15 min for household chores/tasks Baseline: 2 min Goal status: ongoing  8.  Pt will be able to demo increased hip strength to at least 4/5 for improved standing tolerance Baseline: See MMT above Goal status: ongoing   PLAN:  PT FREQUENCY: 1-2x/wk  PT DURATION:  12 weeks (plan to D/C in 6-8)   PLANNED INTERVENTIONS: [id number]- PT Re-evaluation, 97110-Therapeutic exercises, 97530- Therapeutic activity, 97112- Neuromuscular re-education, 97535- Self Care, [id number]- Manual therapy, [id number]- Gait training, [phone]- Orthotic Fit/training, [phone]- Aquatic Therapy, Patient/Family education, Balance training, Stair training, Taping, Dry Needling, Joint mobilization, DME instructions, Cryotherapy, and Moist heat  PLAN FOR NEXT SESSION:  Review HEP and update if tolerable.  Cont with ankle ROM.  Gait training. Ankle stability training.  Continue gross hip and core strengthening. McKenzie techniques for lumbar.   Silver Dross, PT 03/15/24 9:42 AM '

## 2024-03-16 DIAGNOSIS — M1812 Unilateral primary osteoarthritis of first carpometacarpal joint, left hand: Secondary | ICD-10-CM | POA: Diagnosis not present

## 2024-03-16 DIAGNOSIS — M79645 Pain in left finger(s): Secondary | ICD-10-CM | POA: Diagnosis not present

## 2024-03-17 ENCOUNTER — Encounter (HOSPITAL_BASED_OUTPATIENT_CLINIC_OR_DEPARTMENT_OTHER): Payer: Self-pay | Admitting: Physical Therapy

## 2024-03-17 ENCOUNTER — Ambulatory Visit (HOSPITAL_BASED_OUTPATIENT_CLINIC_OR_DEPARTMENT_OTHER): Admitting: Physical Therapy

## 2024-03-17 DIAGNOSIS — M6281 Muscle weakness (generalized): Secondary | ICD-10-CM | POA: Diagnosis not present

## 2024-03-17 DIAGNOSIS — M5459 Other low back pain: Secondary | ICD-10-CM | POA: Diagnosis not present

## 2024-03-17 DIAGNOSIS — M25572 Pain in left ankle and joints of left foot: Secondary | ICD-10-CM | POA: Diagnosis not present

## 2024-03-17 DIAGNOSIS — M25672 Stiffness of left ankle, not elsewhere classified: Secondary | ICD-10-CM | POA: Diagnosis not present

## 2024-03-17 DIAGNOSIS — R262 Difficulty in walking, not elsewhere classified: Secondary | ICD-10-CM | POA: Diagnosis not present

## 2024-03-17 NOTE — Therapy (Signed)
 OUTPATIENT PHYSICAL THERAPY LOWER EXTREMITY TREATMENT    Progress Note Reporting Period 02/23/2024 to 03/17/24   See note below for Objective Data and Assessment of Progress/Goals.       Patient Name: Haley White MRN: [id number] DOB:19-Apr-1956, 68 y.o., female Today's Date: 03/17/2024  END OF SESSION:  PT End of Session - 03/17/24 1119     Visit Number 23    Number of Visits 32    Date for PT Re-Evaluation 06/07/24    Authorization Type [employer] mcr    Progress Note Due on Visit 19    PT Start Time 1100    PT Stop Time 1140    PT Time Calculation (min) 40 min    Activity Tolerance Patient tolerated treatment well    Behavior During Therapy WFL for tasks assessed/performed                 Past Medical History:  Diagnosis Date   Anxiety    Arthritis    Asthma    Cataract    Depression    Gout    Hyperlipidemia    Hypertension    Obese    Sleep apnea    Past Surgical History:  Procedure Laterality Date   ANKLE SURGERY Left    APPENDECTOMY  1984   APPENDECTOMY     CATARACT EXTRACTION W/ INTRAOCULAR LENS  IMPLANT, BILATERAL  2014   CESAREAN SECTION  1984, 1987, 1994   X3   CESAREAN SECTION     x3   COLONOSCOPY  11/16/2013   w/Brodie    EXTERNAL FIXATION LEG Left 06/04/2023   Procedure: EXTERNAL FIXATION LEG;  Surgeon: Ali Ink, MD;  Location: [facility];  Service: Orthopedics;  Laterality: Left;   EYE SURGERY Bilateral    lasix   FOOT FRACTURE SURGERY Left    FOOT SURGERY Left 2002   FRACTURE SURGERY Left    left ankle   HARDWARE REMOVAL Left 12/03/2023   Procedure: HARDWARE REMOVAL OF SYNDEMOSIS;  Surgeon: Ali Ink, MD;  Location: [facility];  Service: Orthopedics;  Laterality: Left;   KNEE SURGERY Left 1998   ORIF ANKLE FRACTURE Left 06/04/2023   Procedure: OPEN REDUCTION INTERNAL FIXATION (ORIF) ANKLE FRACTURE vs EX FIX;  Surgeon: Ali Ink, MD;  Location: [facility];  Service: Orthopedics;  Laterality: Left;    ROTATOR CUFF REPAIR Right 2007   shoulder     torn rotator cuff   TUBAL LIGATION  1994   TUBAL LIGATION     Patient Active Problem List   Diagnosis Date Noted   OSA on CPAP 08/11/2023   Ankle fracture 06/03/2023   Trimalleolar fracture of left ankle 06/02/2023   Acute left ankle pain 06/02/2023   Sleep-related hypoxia 03/24/2023   At risk for obstructive sleep apnea 03/24/2023   Acute pulmonary embolism (HCC) 05/10/2022   Allergic rhinitis 03/15/2021   Gastro-esophageal reflux disease without esophagitis 03/15/2021   Mild persistent asthma, uncomplicated 03/15/2021   Aortic atherosclerosis (HCC) by Chest CT scan on 02/02/2018  01/10/2021   Asthma    Low back pain 03/17/2019   Lumbosacral spondylosis without myelopathy 03/17/2019   Pseudogout 12/09/2017   Major depressive disorder, recurrent, severe without psychotic features (HCC)    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   Suicide attempt by drug ingestion (HCC)    Vitamin D  deficiency 08/22/2014   Hyperlipidemia, mixed 08/22/2014   Hypertension    Anxiety    Abnormal glucose    Class 2 severe obesity due to  excess calories with serious comorbidity and body mass index (BMI) of 37.0 to 37.9 in adult [facility])     PCP: Vangie Genet MD  REFERRING PROVIDER: Ali Ink, MD (for ankle) Bettejane Brownie, MD (for low back)  REFERRING DIAG: S82.852D (ICD-10-CM) - Displaced trimalleolar fracture of left lower leg, subsequent encounter for closed fracture with routine healing   THERAPY DIAG:  Other low back pain  Pain in left ankle and joints of left foot  Stiffness of left ankle, not elsewhere classified  Muscle weakness (generalized)  Difficulty in walking, not elsewhere classified  Rationale for Evaluation and Treatment: Rehabilitation  ONSET DATE: July 2024  SUBJECTIVE:   SUBJECTIVE STATEMENT: Pt reports she woke up in 9/10 pain this morning. Pt feels it into the low back and R glute. Pain with difficulty  walking and moving around this morning.    PERTINENT HISTORY: The patient is s/p left ankle trimalleolar & syndesmosis ORIF, DOS: 06/05/2023.  Removal of deep hardware on 12/03/2023 Hx of foot/ankle surgery in 2022  Fibromyalgia, HTN, PE s/p covid in 2023 LBP with MRI (10/2022) evidence of chronic mild anterior wedge compression fracture of T12 and chronic anterior wedge compression deformity of L1   PAIN:  Are you having pain? Yes: NPRS scale:6/10  Pain location:  low back and R glute Pain description: sharp ache  6/10  Pain location: bilat lower lumbar flanks radiates into R posterior hip Pain description: ache   PRECAUTIONS: None   WEIGHT BEARING RESTRICTIONS: WBAT  FALLS:  Has patient fallen in last 6 months? Yes. Number of falls 1 fell down wet hill walking dog  LIVING ENVIRONMENT: Lives with: lives alone Lives in: House/apartment Stairs: Yes: External: 3 steps; none Has following equipment at home: Single point cane   OCCUPATION: retired   PLOF: Independent  PATIENT GOALS: walk normally and as pain free as able  NEXT MD VISIT: June 2025  OBJECTIVE:  Note: Objective measures were completed at Evaluation unless otherwise noted.  MRI IMPRESSION: 1. No acute findings or clear explanation for the patient's symptoms. Overall findings are similar to previous CT from 2020. 2. Interval development of a healed mild superior endplate compression deformity at L1 since 2020. No acute osseous findings. 3. Multilevel spondylosis as described, most advanced at L4-5 where there is mild spinal stenosis and asymmetric lateral recess and foraminal narrowing on the left with suspected chronic left L4 nerve root encroachment. 4. Mild asymmetric lateral recess and foraminal narrowing on the left at L5-S1 without definite nerve root encroachment.   LOWER EXTREMITY ROM:   Active ROM Right eval Left eval Left  11/17/23 Right 2/28 Left 2/28 AROM/PROM Left 4/21 AROM   L 5/12   Hip flexion              Hip extension              Hip abduction              Hip adduction              Hip internal rotation              Hip external rotation              Knee flexion              Knee extension              Ankle dorsiflexion   -8 Lacking 2 14 Lacking 7 deg / 3  with  pain Lacking 1 degree/pain with AAROM  4  Ankle plantarflexion   30d 42 65 55 65deg 65  Ankle inversion   Limited by 50%   43 24 12 17   Ankle eversion   Limited by 50%   10 2 with pain 12 5 pain   (Blank rows = not tested)  LEFS 4/21: 14/80 LEFS: Lower Extremity Functional Score: 30 / 80 = 37.5 % ODI Score = 17 points (34%)   MMT 4+ throughout L ankle with pain into EV  TODAY'S TREATMENT:   5/14  R LE LAD with ABD 4x round  PPT 2s 2x10 Curl up 2x10 Bridge 3x10 Seated GTB hip ABD iso 5s 2x10 Seated fwd flexion lumbar stretch 10x 5s  SKTC with towel 15s 4x Seated quadratus 15s 4x  Seated LAQ with ankle pump for nerve glide 2x10   5/12  Edu of exam results, foot biomechanics and pain, footwear options, HEP modifications  Gait with heel lift 3 layers, focus on medial pressure at toe off Standing gastroc stretch 30s 3x   02/27/24 Nustep L5 x 6 min UEs/LEs Seated hamstring stretch x 30" Seated hip flexor stretch x 30" Standing heel/toe raise x10 Seated ankle eversion green TB 2x10 Seated ankle inversion green TB 2x10 Seated pball press down 10x5" Seated pball press down opposite arm/leg 10x5" Palloff press attempted but could feel pull on low back Standing shoulder ext red TB in partial tandem stance 2x10  02/23/24 Prone lying x22min Trialed prone on elbows, but painful after ~50seconds Prone hip extension x10 L LE, trialed on R but painful  -Supine piriformis stretch- attempted, but painful -LTR gentle -Hooklying PPT x15 Updated ankle ROM Seated HR/TR 2x10ea Standing gastroc stretch    02/19/24 Reviewed response to prior Rx, pt presentation, pain level,  and HEP compliance. Prone lying x 3 mins Supine piriformis stretch 3x30 sec bilat PT educated pt in correct form and palpation of TrA contraction and pt performed TrA contraction in supine. Supine heel slides with TrA 2x5 Ankle DF and PF with YTB 2x10 each Seated BAPS board approx 15 cw and ccw  See below for pt education  02/17/24 Prone on stomach x 3 min Prone press up on elbow x10 (centralized pt's pain) Prone hip abd x10 Supine figure 4 stretch x 30" Supine piriformis stretch x 30" Attempted bridges but hurt pt's back too much Heel press glute setting 2x10 Supine PPT + TSA activation 2x10      PATIENT EDUCATION:  Education details:  HEP updates Person educated: Patient Education method: Explanation, Demonstration, Tactile cues, Verbal cues Education comprehension: verbalized understanding, returned demonstration, verbal cues required, tactile cues required, and needs further education     HOME EXERCISE PROGRAM: Access Code: [email or web address] URL: [email or web address] Date: 02/27/2024 Prepared by: Gellen April Marie Nonato  Exercises - Toe Yoga - Alternating Great Toe and Lesser Toe Extension  - 2 x daily - 7 x weekly - 1 sets - 10 reps - Lying Prone  - 1 x daily - 7 x weekly - 1 sets - 5 min hold - Prone Press Up On Elbows  - 1 x daily - 7 x weekly - 1-2 sets - 10 reps - Prone Hip Abduction on Slider  - 1 x daily - 7 x weekly - 2 sets - 10 reps - Supine Piriformis Stretch with Foot on Ground  - 1 x daily - 7 x weekly - 2 sets - 30 sec hold - Hooklying Gluteal Sets  - 1 x  daily - 7 x weekly - 2 sets - 10 reps - Supine Transversus Abdominis Bracing - Hands on Stomach  - 1 x daily - 7 x weekly - 2 sets - 10 reps - 3-5 sec hold - Seated Ankle Eversion with Resistance  - 1 x daily - 7 x weekly - 2 sets - 10 reps - Seated Ankle Inversion with Resistance  - 1 x daily - 7 x weekly - 2 sets - 10 reps - Heel Toe Raises with Unilateral Counter Support  - 1 x daily - 7 x  weekly - 2 sets - 10 reps - Shoulder extension with resistance - Neutral  - 1 x daily - 7 x weekly - 2 sets - 10 reps   ASSESSMENT:  CLINICAL IMPRESSION: Pt presents with highly irritable LBP symptoms at start of session that causes difficulty with gait and flexed posture with hip shift. Pt session focused on lumbar muscle activation and gentle ROM to improve mobility. Pt does report pain at end of session but feels that standing and walking are more comfortable. HEP updated for lumbar exercise. Pt advised on self pain management and lumbar mobility while driving. Plan to progress ankle and lumbar strength at next as tolerated.  Pt would benefit from continued skilled therapy in order to reach goals and maximize functional LE and lumbpelvic strength and ROM for improvement in community mobility and ADL.   OBJECTIVE IMPAIRMENTS: Abnormal gait, decreased activity tolerance, decreased balance, difficulty walking, decreased ROM, decreased strength, obesity, and pain.   ACTIVITY LIMITATIONS: squatting, stairs, and locomotion level  PARTICIPATION LIMITATIONS: shopping and community activity  PERSONAL FACTORS: Past/current experiences and 1 comorbidity: back dysfunction are also affecting patient's functional outcome.   REHAB POTENTIAL: Good  CLINICAL DECISION MAKING: Evolving/moderate complexity  EVALUATION COMPLEXITY: Moderate   GOALS:  SHORT TERM GOALS: Target date: 11/24/23 Pt will tolerate full aquatic sessions consistently without increase in pain and with improving function to demonstrate good toleration and effectiveness of intervention.  Baseline: Goal status: Met 11/24/23  2.  Pt will navigate stair into and out of pool 6 steps using step through pattern Baseline: step-to pattern Goal status: In progress 11/10/23. Partially met 11/24/23  3.  Pt will report reduction in pain by 3 NPRS post aquatic session which lasts for up to 24 hours. Baseline:  Goal status: Met 11/24/23  4.  Pt  will amb without antalgic gait pattern submerged in 3.6 ft Baseline:  Goal status:MET - 11/17/23  5.  Pt will demo improved quality of gait including increased toe off and stance time on L LE with reduced favoring of L LE. Goal status: ongoing Target date:  01/23/2024  6.  Pt will demo improved L ankle AROM to at least 0 deg in DF and 7 deg in Eversion for improved stiffness and gait.  Goal status: ongoing Target date:  01/23/2024     LONG TERM GOALS: Target date: 06/13/2024    Pt will demonstrate an improvement in LEFS by at least 9 points for a clinically significant improvement in self perceived disability.  Baseline: 30/80 Goal status: MET  2.  Pt wiil navigate 1 flight of steps using step alternating pattern indep Baseline:  Goal status: ongoing  3.  Pt will demonstrate improved DF AROM to be w/n 5 deg of R ankle for improved stiffness, gait, and performance of stairs.  Baseline: see chart Goal status: IN PROGRESS 4/21  4.  Pt will demo left ankle strength to be at least  4/5 in DF and WFL in PF (tested with manual resistance in open chain) in order for improved performance of and tolerance with functional mobility Baseline: see chart Goal status:  MET  5.  Pt will amb without AD without limitation to pain Baseline:using cane or walker  Goal status: IN PROGRESS (still using cane most   6.  Pt will have improved Oswestry score to >/= 43/50 to demo MCID Baseline: Oswestry Score: 21 / 50 or 42 % Goal status: ongoing  7.  Pt will be able to tolerate standing tasks for >/=15 min for household chores/tasks Baseline: 2 min Goal status: ongoing  8.  Pt will be able to demo increased hip strength to at least 4/5 for improved standing tolerance Baseline: See MMT above Goal status: ongoing   PLAN:  PT FREQUENCY: 1-2x/wk  PT DURATION: 12 weeks (plan to D/C in 6-8)   PLANNED INTERVENTIONS: 29562- PT Re-evaluation, 97110-Therapeutic exercises, 97530- Therapeutic activity,  97112- Neuromuscular re-education, 97535- Self Care, 13086- Manual therapy, [id number]- Gait training, [phone]- Orthotic Fit/training, [phone]- Aquatic Therapy, Patient/Family education, Balance training, Stair training, Taping, Dry Needling, Joint mobilization, DME instructions, Cryotherapy, and Moist heat  PLAN FOR NEXT SESSION:  Review HEP and update if tolerable.  Cont with ankle ROM.  Gait training. Ankle stability training.  Continue gross hip and core strengthening. McKenzie techniques for lumbar.   Silver Dross, PT 03/17/24 11:45 AM '

## 2024-03-22 ENCOUNTER — Ambulatory Visit (HOSPITAL_BASED_OUTPATIENT_CLINIC_OR_DEPARTMENT_OTHER): Admitting: Physical Therapy

## 2024-03-22 ENCOUNTER — Encounter (HOSPITAL_BASED_OUTPATIENT_CLINIC_OR_DEPARTMENT_OTHER): Payer: Self-pay | Admitting: Physical Therapy

## 2024-03-22 DIAGNOSIS — M6281 Muscle weakness (generalized): Secondary | ICD-10-CM | POA: Diagnosis not present

## 2024-03-22 DIAGNOSIS — R262 Difficulty in walking, not elsewhere classified: Secondary | ICD-10-CM | POA: Diagnosis not present

## 2024-03-22 DIAGNOSIS — M5459 Other low back pain: Secondary | ICD-10-CM

## 2024-03-22 DIAGNOSIS — M25672 Stiffness of left ankle, not elsewhere classified: Secondary | ICD-10-CM | POA: Diagnosis not present

## 2024-03-22 DIAGNOSIS — M25572 Pain in left ankle and joints of left foot: Secondary | ICD-10-CM | POA: Diagnosis not present

## 2024-03-22 NOTE — Therapy (Signed)
 OUTPATIENT PHYSICAL THERAPY LOWER EXTREMITY TREATMENT          Patient Name: Haley White MRN: [id number] DOB:[birth year], 68 y.o., female Today's Date: 03/22/2024  END OF SESSION:  PT End of Session - 03/22/24 0852     Visit Number 24    Number of Visits 32    Date for PT Re-Evaluation 06/07/24    Authorization Type aetna mcr    PT Start Time 0849    PT Stop Time 0934    PT Time Calculation (min) 45 min    Activity Tolerance Patient tolerated treatment well    Behavior During Therapy WFL for tasks assessed/performed                  Past Medical History:  Diagnosis Date   Anxiety    Arthritis    Asthma    Cataract    Depression    Gout    Hyperlipidemia    Hypertension    Obese    Sleep apnea    Past Surgical History:  Procedure Laterality Date   ANKLE SURGERY Left    APPENDECTOMY  1984   APPENDECTOMY     CATARACT EXTRACTION W/ INTRAOCULAR LENS  IMPLANT, BILATERAL  2014   CESAREAN SECTION  1984, 1987, 1994   X3   CESAREAN SECTION     x3   COLONOSCOPY  11/16/2013   w/Brodie    EXTERNAL FIXATION LEG Left 06/04/2023   Procedure: EXTERNAL FIXATION LEG;  Surgeon: Ali Ink, MD;  Location: [facility];  Service: Orthopedics;  Laterality: Left;   EYE SURGERY Bilateral    lasix   FOOT FRACTURE SURGERY Left    FOOT SURGERY Left 2002   FRACTURE SURGERY Left    left ankle   HARDWARE REMOVAL Left 12/03/2023   Procedure: HARDWARE REMOVAL OF SYNDEMOSIS;  Surgeon: Ali Ink, MD;  Location: [facility];  Service: Orthopedics;  Laterality: Left;   KNEE SURGERY Left 1998   ORIF ANKLE FRACTURE Left 06/04/2023   Procedure: OPEN REDUCTION INTERNAL FIXATION (ORIF) ANKLE FRACTURE vs EX FIX;  Surgeon: Ali Ink, MD;  Location: [facility];  Service: Orthopedics;  Laterality: Left;   ROTATOR CUFF REPAIR Right 2007   shoulder     torn rotator cuff   TUBAL LIGATION  1994   TUBAL LIGATION     Patient Active Problem List   Diagnosis Date  Noted   OSA on CPAP 08/11/2023   Ankle fracture 06/03/2023   Trimalleolar fracture of left ankle 06/02/2023   Acute left ankle pain 06/02/2023   Sleep-related hypoxia 03/24/2023   At risk for obstructive sleep apnea 03/24/2023   Acute pulmonary embolism (HCC) 05/10/2022   Allergic rhinitis 03/15/2021   Gastro-esophageal reflux disease without esophagitis 03/15/2021   Mild persistent asthma, uncomplicated 03/15/2021   Aortic atherosclerosis (HCC) by Chest CT scan on 02/02/2018  01/10/2021   Asthma    Low back pain 03/17/2019   Lumbosacral spondylosis without myelopathy 03/17/2019   Pseudogout 12/09/2017   Major depressive disorder, recurrent, severe without psychotic features (HCC)    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   Suicide attempt by drug ingestion (HCC)    Vitamin D  deficiency 08/22/2014   Hyperlipidemia, mixed 08/22/2014   Hypertension    Anxiety    Abnormal glucose    Class 2 severe obesity due to excess calories with serious comorbidity and body mass index (BMI) of 37.0 to 37.9 in adult [facility])     PCP: Vangie Genet MD  REFERRING  PROVIDER: Ali Ink, MD (for ankle) Bettejane Brownie, MD (for low back)  REFERRING DIAG: S82.852D (ICD-10-CM) - Displaced trimalleolar fracture of left lower leg, subsequent encounter for closed fracture with routine healing   THERAPY DIAG:  Other low back pain  Pain in left ankle and joints of left foot  Stiffness of left ankle, not elsewhere classified  Muscle weakness (generalized)  Difficulty in walking, not elsewhere classified  Rationale for Evaluation and Treatment: Rehabilitation  ONSET DATE: July 2024  SUBJECTIVE:   SUBJECTIVE STATEMENT: Pt states her pain management MD thinks the SI is the issue.  She has been referred to Dr. Vaughn Georges.  Pt states it's hard to tell if her central lumbar pain is better due to her R sided lumbar pain.  "My ankle is at a standstill".  Pt states she felt ok after prior Rx.     PERTINENT HISTORY: The patient is s/p left ankle trimalleolar & syndesmosis ORIF, DOS: 06/05/2023.  Removal of deep hardware on 12/03/2023 Hx of foot/ankle surgery in 2022  Fibromyalgia, HTN, PE s/p covid in 2023 LBP with MRI (10/2022) evidence of chronic mild anterior wedge compression fracture of T12 and chronic anterior wedge compression deformity of L1   PAIN:  Are you having pain? Yes:  NPRS scale: 7-8/10  Pain location:  low back and R glute Pain description: sharp, dull, nagging, ache  4/10  Pain location: lateral L ankle Pain description: sharp and dull depending on what she does   PRECAUTIONS: None   WEIGHT BEARING RESTRICTIONS: WBAT  FALLS:  Has patient fallen in last 6 months? Yes. Number of falls 1 fell down wet hill walking dog  LIVING ENVIRONMENT: Lives with: lives alone Lives in: House/apartment Stairs: Yes: External: 3 steps; none Has following equipment at home: Single point cane   OCCUPATION: retired   PLOF: Independent  PATIENT GOALS: walk normally and as pain free as able  NEXT MD VISIT: June 2025  OBJECTIVE:  Note: Objective measures were completed at Evaluation unless otherwise noted.  MRI IMPRESSION: 1. No acute findings or clear explanation for the patient's symptoms. Overall findings are similar to previous CT from 2020. 2. Interval development of a healed mild superior endplate compression deformity at L1 since 2020. No acute osseous findings. 3. Multilevel spondylosis as described, most advanced at L4-5 where there is mild spinal stenosis and asymmetric lateral recess and foraminal narrowing on the left with suspected chronic left L4 nerve root encroachment. 4. Mild asymmetric lateral recess and foraminal narrowing on the left at L5-S1 without definite nerve root encroachment.   LOWER EXTREMITY ROM:   Active ROM Right eval Left eval Left  11/17/23 Right 2/28 Left 2/28 AROM/PROM Left 4/21 AROM  L 5/12   Hip flexion               Hip extension              Hip abduction              Hip adduction              Hip internal rotation              Hip external rotation              Knee flexion              Knee extension              Ankle dorsiflexion   -8 Lacking 2 14 Lacking  7 deg / 3  with pain Lacking 1 degree/pain with AAROM  4  Ankle plantarflexion   30d 42 65 55 65deg 65  Ankle inversion   Limited by 50%   43 24 12 17   Ankle eversion   Limited by 50%   10 2 with pain 12 5 pain   (Blank rows = not tested)  LEFS 4/21: 14/80 LEFS: Lower Extremity Functional Score: 30 / 80 = 37.5 % ODI Score = 17 points (34%)   MMT 4+ throughout L ankle with pain into EV  TODAY'S TREATMENT:   5/19 Reviewed pt presentation, pain levels, and response to prior Rx.  Pt received L ankle DF, PF, Eve, and Inv PROM. Seated BAPS board 2x10 each cw, ccw, and f/b. Attempted bridge with TrA though stopped due to pain Supine PPT with TrA 2x10 Supine clams with TrA with RTB 2x10 Supine manual HS stretch 2x30 sec bilat Seated ankle DF with GTB 2x10, Inv with GTB 2x10, Eve with RTB 2x10  5/14  R LE LAD with ABD 4x round  PPT 2s 2x10 Curl up 2x10 Bridge 3x10 Seated GTB hip ABD iso 5s 2x10 Seated fwd flexion lumbar stretch 10x 5s  SKTC with towel 15s 4x Seated quadratus 15s 4x  Seated LAQ with ankle pump for nerve glide 2x10   5/12  Edu of exam results, foot biomechanics and pain, footwear options, HEP modifications  Gait with heel lift 3 layers, focus on medial pressure at toe off Standing gastroc stretch 30s 3x   02/27/24 Nustep L5 x 6 min UEs/LEs Seated hamstring stretch x 30" Seated hip flexor stretch x 30" Standing heel/toe raise x10 Seated ankle eversion green TB 2x10 Seated ankle inversion green TB 2x10 Seated pball press down 10x5" Seated pball press down opposite arm/leg 10x5" Palloff press attempted but could feel pull on low back Standing shoulder ext red TB in partial tandem stance  2x10  02/23/24 Prone lying x50min Trialed prone on elbows, but painful after ~50seconds Prone hip extension x10 L LE, trialed on R but painful  -Supine piriformis stretch- attempted, but painful -LTR gentle -Hooklying PPT x15 Updated ankle ROM Seated HR/TR 2x10ea Standing gastroc stretch    02/19/24 Reviewed response to prior Rx, pt presentation, pain level, and HEP compliance. Prone lying x 3 mins Supine piriformis stretch 3x30 sec bilat PT educated pt in correct form and palpation of TrA contraction and pt performed TrA contraction in supine. Supine heel slides with TrA 2x5 Ankle DF and PF with YTB 2x10 each Seated BAPS board approx 15 cw and ccw  See below for pt education    PATIENT EDUCATION:  Education details:  exercise form, relevant anatomy Person educated: Patient Education method: Explanation, Demonstration, Tactile cues, Verbal cues Education comprehension: verbalized understanding, returned demonstration, verbal cues required, tactile cues required, and needs further education     HOME EXERCISE PROGRAM: Access Code: [email or web address] URL: [email or web address] Date: 02/27/2024 Prepared by: Gellen April Marie Nonato  Exercises - Toe Yoga - Alternating Great Toe and Lesser Toe Extension  - 2 x daily - 7 x weekly - 1 sets - 10 reps - Lying Prone  - 1 x daily - 7 x weekly - 1 sets - 5 min hold - Prone Press Up On Elbows  - 1 x daily - 7 x weekly - 1-2 sets - 10 reps - Prone Hip Abduction on Slider  - 1 x daily - 7 x weekly - 2 sets - 10 reps - Supine Piriformis  Stretch with Foot on Ground  - 1 x daily - 7 x weekly - 2 sets - 30 sec hold - Hooklying Gluteal Sets  - 1 x daily - 7 x weekly - 2 sets - 10 reps - Supine Transversus Abdominis Bracing - Hands on Stomach  - 1 x daily - 7 x weekly - 2 sets - 10 reps - 3-5 sec hold - Seated Ankle Eversion with Resistance  - 1 x daily - 7 x weekly - 2 sets - 10 reps - Seated Ankle Inversion with Resistance  - 1 x daily  - 7 x weekly - 2 sets - 10 reps - Heel Toe Raises with Unilateral Counter Support  - 1 x daily - 7 x weekly - 2 sets - 10 reps - Shoulder extension with resistance - Neutral  - 1 x daily - 7 x weekly - 2 sets - 10 reps   ASSESSMENT:  CLINICAL IMPRESSION: Pt continues to have lumbar pain which affects her mobility and daily activities.  Pt also reports her ankle not progressing at this time.  Pt had back pain with bridging and PT had pt stop that exercise.  She tolerated the other exercises well including ankle theraband exercises.  Pt had weakness in ankle eversion.  She responded well to Rx reporting no increased pain and states she feels better after Rx.  Pt should benefit from cont skilled PT to address ongoing goals and impairments and overall function.   OBJECTIVE IMPAIRMENTS: Abnormal gait, decreased activity tolerance, decreased balance, difficulty walking, decreased ROM, decreased strength, obesity, and pain.   ACTIVITY LIMITATIONS: squatting, stairs, and locomotion level  PARTICIPATION LIMITATIONS: shopping and community activity  PERSONAL FACTORS: Past/current experiences and 1 comorbidity: back dysfunction are also affecting patient's functional outcome.   REHAB POTENTIAL: Good  CLINICAL DECISION MAKING: Evolving/moderate complexity  EVALUATION COMPLEXITY: Moderate   GOALS:  SHORT TERM GOALS: Target date: 11/24/23 Pt will tolerate full aquatic sessions consistently without increase in pain and with improving function to demonstrate good toleration and effectiveness of intervention.  Baseline: Goal status: Met 11/24/23  2.  Pt will navigate stair into and out of pool 6 steps using step through pattern Baseline: step-to pattern Goal status: In progress 11/10/23. Partially met 11/24/23  3.  Pt will report reduction in pain by 3 NPRS post aquatic session which lasts for up to 24 hours. Baseline:  Goal status: Met 11/24/23  4.  Pt will amb without antalgic gait pattern  submerged in 3.6 ft Baseline:  Goal status:MET - 11/17/23  5.  Pt will demo improved quality of gait including increased toe off and stance time on L LE with reduced favoring of L LE. Goal status: ongoing Target date:  01/23/2024  6.  Pt will demo improved L ankle AROM to at least 0 deg in DF and 7 deg in Eversion for improved stiffness and gait.  Goal status: ongoing Target date:  01/23/2024     LONG TERM GOALS: Target date: 06/13/2024    Pt will demonstrate an improvement in LEFS by at least 9 points for a clinically significant improvement in self perceived disability.  Baseline: 30/80 Goal status: MET  2.  Pt wiil navigate 1 flight of steps using step alternating pattern indep Baseline:  Goal status: ongoing  3.  Pt will demonstrate improved DF AROM to be w/n 5 deg of R ankle for improved stiffness, gait, and performance of stairs.  Baseline: see chart Goal status: IN PROGRESS 4/21  4.  Pt will demo left ankle strength to be at least 4/5 in DF and WFL in PF (tested with manual resistance in open chain) in order for improved performance of and tolerance with functional mobility Baseline: see chart Goal status:  MET  5.  Pt will amb without AD without limitation to pain Baseline:using cane or walker  Goal status: IN PROGRESS (still using cane most   6.  Pt will have improved Oswestry score to >/= 43/50 to demo MCID Baseline: Oswestry Score: 21 / 50 or 42 % Goal status: ongoing  7.  Pt will be able to tolerate standing tasks for >/=15 min for household chores/tasks Baseline: 2 min Goal status: ongoing  8.  Pt will be able to demo increased hip strength to at least 4/5 for improved standing tolerance Baseline: See MMT above Goal status: ongoing   PLAN:  PT FREQUENCY: 1-2x/wk  PT DURATION: 12 weeks (plan to D/C in 6-8)   PLANNED INTERVENTIONS: 29562- PT Re-evaluation, 97110-Therapeutic exercises, 97530- Therapeutic activity, 97112- Neuromuscular re-education,  97535- Self Care, 13086- Manual therapy, [id number]- Gait training, [phone]- Orthotic Fit/training, [phone]- Aquatic Therapy, Patient/Family education, Balance training, Stair training, Taping, Dry Needling, Joint mobilization, DME instructions, Cryotherapy, and Moist heat  PLAN FOR NEXT SESSION:  Review HEP and update if tolerable.  Cont with ankle ROM.  Gait training. Ankle stability training.  Continue gross hip and core strengthening.   Trina Fujita III PT, DPT 03/23/24 12:54 PM

## 2024-03-23 ENCOUNTER — Other Ambulatory Visit: Payer: Self-pay | Admitting: Family

## 2024-03-24 ENCOUNTER — Encounter (HOSPITAL_BASED_OUTPATIENT_CLINIC_OR_DEPARTMENT_OTHER): Payer: Self-pay | Admitting: Physical Therapy

## 2024-03-24 ENCOUNTER — Ambulatory Visit (HOSPITAL_BASED_OUTPATIENT_CLINIC_OR_DEPARTMENT_OTHER): Admitting: Physical Therapy

## 2024-03-24 DIAGNOSIS — M5459 Other low back pain: Secondary | ICD-10-CM

## 2024-03-24 DIAGNOSIS — M25672 Stiffness of left ankle, not elsewhere classified: Secondary | ICD-10-CM

## 2024-03-24 DIAGNOSIS — M6281 Muscle weakness (generalized): Secondary | ICD-10-CM

## 2024-03-24 DIAGNOSIS — R262 Difficulty in walking, not elsewhere classified: Secondary | ICD-10-CM

## 2024-03-24 DIAGNOSIS — G4733 Obstructive sleep apnea (adult) (pediatric): Secondary | ICD-10-CM | POA: Diagnosis not present

## 2024-03-24 DIAGNOSIS — M25572 Pain in left ankle and joints of left foot: Secondary | ICD-10-CM | POA: Diagnosis not present

## 2024-03-24 NOTE — Therapy (Signed)
 OUTPATIENT PHYSICAL THERAPY LOWER EXTREMITY TREATMENT          Patient Name: Haley White MRN: [id number] DOB:[birth year], 68 y.o., female Today's Date: 03/25/2024  END OF SESSION:  PT End of Session - 03/24/24 0939     Visit Number 25    Number of Visits 32    Date for PT Re-Evaluation 06/07/24    Authorization Type aetna mcr    PT Start Time [phone]    PT Stop Time 1022    PT Time Calculation (min) 45 min    Activity Tolerance Patient tolerated treatment well    Behavior During Therapy WFL for tasks assessed/performed                  Past Medical History:  Diagnosis Date   Anxiety    Arthritis    Asthma    Cataract    Depression    Gout    Hyperlipidemia    Hypertension    Obese    Sleep apnea    Past Surgical History:  Procedure Laterality Date   ANKLE SURGERY Left    APPENDECTOMY  1984   APPENDECTOMY     CATARACT EXTRACTION W/ INTRAOCULAR LENS  IMPLANT, BILATERAL  2014   CESAREAN SECTION  1984, 1987, 1994   X3   CESAREAN SECTION     x3   COLONOSCOPY  11/16/2013   w/Brodie    EXTERNAL FIXATION LEG Left 06/04/2023   Procedure: EXTERNAL FIXATION LEG;  Surgeon: Ali Ink, MD;  Location: [facility];  Service: Orthopedics;  Laterality: Left;   EYE SURGERY Bilateral    lasix   FOOT FRACTURE SURGERY Left    FOOT SURGERY Left 2002   FRACTURE SURGERY Left    left ankle   HARDWARE REMOVAL Left 12/03/2023   Procedure: HARDWARE REMOVAL OF SYNDEMOSIS;  Surgeon: Ali Ink, MD;  Location: [facility];  Service: Orthopedics;  Laterality: Left;   KNEE SURGERY Left 1998   ORIF ANKLE FRACTURE Left 06/04/2023   Procedure: OPEN REDUCTION INTERNAL FIXATION (ORIF) ANKLE FRACTURE vs EX FIX;  Surgeon: Ali Ink, MD;  Location: [facility];  Service: Orthopedics;  Laterality: Left;   ROTATOR CUFF REPAIR Right 2007   shoulder     torn rotator cuff   TUBAL LIGATION  1994   TUBAL LIGATION     Patient Active Problem List   Diagnosis Date  Noted   OSA on CPAP 08/11/2023   Ankle fracture 06/03/2023   Trimalleolar fracture of left ankle 06/02/2023   Acute left ankle pain 06/02/2023   Sleep-related hypoxia 03/24/2023   At risk for obstructive sleep apnea 03/24/2023   Acute pulmonary embolism (HCC) 05/10/2022   Allergic rhinitis 03/15/2021   Gastro-esophageal reflux disease without esophagitis 03/15/2021   Mild persistent asthma, uncomplicated 03/15/2021   Aortic atherosclerosis (HCC) by Chest CT scan on 02/02/2018  01/10/2021   Asthma    Low back pain 03/17/2019   Lumbosacral spondylosis without myelopathy 03/17/2019   Pseudogout 12/09/2017   Major depressive disorder, recurrent, severe without psychotic features (HCC)    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   Suicide attempt by drug ingestion (HCC)    Vitamin D  deficiency 08/22/2014   Hyperlipidemia, mixed 08/22/2014   Hypertension    Anxiety    Abnormal glucose    Class 2 severe obesity due to excess calories with serious comorbidity and body mass index (BMI) of 37.0 to 37.9 in adult [facility])     PCP: Vangie Genet MD  REFERRING  PROVIDER: Ali Ink, MD (for ankle) Bettejane Brownie, MD (for low back)  REFERRING DIAG: S82.852D (ICD-10-CM) - Displaced trimalleolar fracture of left lower leg, subsequent encounter for closed fracture with routine healing   THERAPY DIAG:  Other low back pain  Pain in left ankle and joints of left foot  Stiffness of left ankle, not elsewhere classified  Muscle weakness (generalized)  Difficulty in walking, not elsewhere classified  Rationale for Evaluation and Treatment: Rehabilitation  ONSET DATE: July 2024  SUBJECTIVE:   SUBJECTIVE STATEMENT: Pt states she feels "so-so" today.  Pt states she felt "so-so" after prior Rx.  She states she was more sore than normal after prior Rx.  Pt states she may be hurting more due to the weather.  Pt is seeing Dr. Vaughn Georges on 6/13.       PERTINENT HISTORY: The patient is s/p  left ankle trimalleolar & syndesmosis ORIF, DOS: 06/05/2023.  Removal of deep hardware on 12/03/2023 Hx of foot/ankle surgery in 2022  Fibromyalgia, HTN, PE s/p covid in 2023 LBP with MRI (10/2022) evidence of chronic mild anterior wedge compression fracture of T12 and chronic anterior wedge compression deformity of L1   PAIN:  Are you having pain? Yes:  NPRS scale: 6-7/10  Pain location:  low back and R glute Pain description: sharp, dull, nagging, ache  4-5/10 Pain location: medial and lateral L ankle Pain description: sharp and dull depending on what she does   PRECAUTIONS: None   WEIGHT BEARING RESTRICTIONS: WBAT  FALLS:  Has patient fallen in last 6 months? Yes. Number of falls 1 fell down wet hill walking dog  LIVING ENVIRONMENT: Lives with: lives alone Lives in: House/apartment Stairs: Yes: External: 3 steps; none Has following equipment at home: Single point cane   OCCUPATION: retired   PLOF: Independent  PATIENT GOALS: walk normally and as pain free as able  NEXT MD VISIT: June 2025  OBJECTIVE:  Note: Objective measures were completed at Evaluation unless otherwise noted.  MRI IMPRESSION: 1. No acute findings or clear explanation for the patient's symptoms. Overall findings are similar to previous CT from 2020. 2. Interval development of a healed mild superior endplate compression deformity at L1 since 2020. No acute osseous findings. 3. Multilevel spondylosis as described, most advanced at L4-5 where there is mild spinal stenosis and asymmetric lateral recess and foraminal narrowing on the left with suspected chronic left L4 nerve root encroachment. 4. Mild asymmetric lateral recess and foraminal narrowing on the left at L5-S1 without definite nerve root encroachment.   LOWER EXTREMITY ROM:   Active ROM Right eval Left eval Left  11/17/23 Right 2/28 Left 2/28 AROM/PROM Left 4/21 AROM  L 5/12   Hip flexion              Hip extension               Hip abduction              Hip adduction              Hip internal rotation              Hip external rotation              Knee flexion              Knee extension              Ankle dorsiflexion   -8 Lacking 2 14 Lacking 7 deg / 3  with  pain Lacking 1 degree/pain with AAROM  4  Ankle plantarflexion   30d 42 65 55 65deg 65  Ankle inversion   Limited by 50%   43 24 12 17   Ankle eversion   Limited by 50%   10 2 with pain 12 5 pain   (Blank rows = not tested)  LEFS 4/21: 14/80 LEFS: Lower Extremity Functional Score: 30 / 80 = 37.5 % ODI Score = 17 points (34%)   MMT 4+ throughout L ankle with pain into EV  TODAY'S TREATMENT:   5/21 Manual Therapy:  STM to bilat lumbar paraspinals with very gentle STM to R sided lumbar.  STM with gentle TPR to R glute.  MT performed with pt in prone.  Reviewed pt presentation, pain levels, and response to prior Rx.  Seated BAPS board 2x10 each cw, ccw, and f/b. Supine PPT with TrA 2x10 Seated ankle DF with GTB 2x10, Inv with GTB 2x10, Eve with RTB 2x10 Supine manual HS stretch 2x30 sec bilat      5/19 Reviewed pt presentation, pain levels, and response to prior Rx.  Pt received L ankle DF, PF, Eve, and Inv PROM. Seated BAPS board 2x10 each cw, ccw, and f/b. Attempted bridge with TrA though stopped due to pain Supine PPT with TrA 2x10 Supine clams with TrA with RTB 2x10 Supine manual HS stretch 2x30 sec bilat Seated ankle DF with GTB 2x10, Inv with GTB 2x10, Eve with RTB 2x10  5/14  R LE LAD with ABD 4x round  PPT 2s 2x10 Curl up 2x10 Bridge 3x10 Seated GTB hip ABD iso 5s 2x10 Seated fwd flexion lumbar stretch 10x 5s  SKTC with towel 15s 4x Seated quadratus 15s 4x  Seated LAQ with ankle pump for nerve glide 2x10   5/12  Edu of exam results, foot biomechanics and pain, footwear options, HEP modifications  Gait with heel lift 3 layers, focus on medial pressure at toe off Standing gastroc stretch 30s 3x    02/27/24 Nustep L5 x 6 min UEs/LEs Seated hamstring stretch x 30" Seated hip flexor stretch x 30" Standing heel/toe raise x10 Seated ankle eversion green TB 2x10 Seated ankle inversion green TB 2x10 Seated pball press down 10x5" Seated pball press down opposite arm/leg 10x5" Palloff press attempted but could feel pull on low back Standing shoulder ext red TB in partial tandem stance 2x10  02/23/24 Prone lying x65min Trialed prone on elbows, but painful after ~50seconds Prone hip extension x10 L LE, trialed on R but painful  -Supine piriformis stretch- attempted, but painful -LTR gentle -Hooklying PPT x15 Updated ankle ROM Seated HR/TR 2x10ea Standing gastroc stretch     PATIENT EDUCATION:  Education details:  exercise form, relevant anatomy Person educated: Patient Education method: Explanation, Demonstration, Tactile cues, Verbal cues Education comprehension: verbalized understanding, returned demonstration, verbal cues required, tactile cues required, and needs further education     HOME EXERCISE PROGRAM: Access Code: [email or web address] URL: [email or web address] Date: 02/27/2024 Prepared by: Gellen April Marie Nonato  Exercises - Toe Yoga - Alternating Great Toe and Lesser Toe Extension  - 2 x daily - 7 x weekly - 1 sets - 10 reps - Lying Prone  - 1 x daily - 7 x weekly - 1 sets - 5 min hold - Prone Press Up On Elbows  - 1 x daily - 7 x weekly - 1-2 sets - 10 reps - Prone Hip Abduction on Slider  - 1 x daily - 7 x weekly - 2 sets -  10 reps - Supine Piriformis Stretch with Foot on Ground  - 1 x daily - 7 x weekly - 2 sets - 30 sec hold - Hooklying Gluteal Sets  - 1 x daily - 7 x weekly - 2 sets - 10 reps - Supine Transversus Abdominis Bracing - Hands on Stomach  - 1 x daily - 7 x weekly - 2 sets - 10 reps - 3-5 sec hold - Seated Ankle Eversion with Resistance  - 1 x daily - 7 x weekly - 2 sets - 10 reps - Seated Ankle Inversion with Resistance  - 1 x daily - 7 x  weekly - 2 sets - 10 reps - Heel Toe Raises with Unilateral Counter Support  - 1 x daily - 7 x weekly - 2 sets - 10 reps - Shoulder extension with resistance - Neutral  - 1 x daily - 7 x weekly - 2 sets - 10 reps   ASSESSMENT:  CLINICAL IMPRESSION: Pt continues to have lumbar pain which affects her mobility and daily activities.  Pt also reports her ankle not progressing at this time.  Pt had back pain with bridging and PT had pt stop that exercise.  She tolerated the other exercises well including ankle theraband exercises.  Pt had weakness in ankle eversion.  She responded well to Rx reporting no increased pain and states she feels better after Rx.  Pt should benefit from cont skilled PT to address ongoing goals and impairments and overall function.   Pt is very tender and sensitive with soft tissue work on R sided lumbar paraspinals.  PT performed very gentle STM with light pressure to R sided lumbar paraspinals.  Pt has trigger points in R glute and is tender with soft tissue work.  Pt states her back feels much better after STM.  Pt has back pain with mobility  on table.  Pt performed exercises well.  She performed ankle resisted exercises in the chair with support for her back.  Pt responded well to Rx stating she  felt better after treatment.   OBJECTIVE IMPAIRMENTS: Abnormal gait, decreased activity tolerance, decreased balance, difficulty walking, decreased ROM, decreased strength, obesity, and pain.   ACTIVITY LIMITATIONS: squatting, stairs, and locomotion level  PARTICIPATION LIMITATIONS: shopping and community activity  PERSONAL FACTORS: Past/current experiences and 1 comorbidity: back dysfunction are also affecting patient's functional outcome.   REHAB POTENTIAL: Good  CLINICAL DECISION MAKING: Evolving/moderate complexity  EVALUATION COMPLEXITY: Moderate   GOALS:  SHORT TERM GOALS: Target date: 11/24/23 Pt will tolerate full aquatic sessions consistently without increase in  pain and with improving function to demonstrate good toleration and effectiveness of intervention.  Baseline: Goal status: Met 11/24/23  2.  Pt will navigate stair into and out of pool 6 steps using step through pattern Baseline: step-to pattern Goal status: In progress 11/10/23. Partially met 11/24/23  3.  Pt will report reduction in pain by 3 NPRS post aquatic session which lasts for up to 24 hours. Baseline:  Goal status: Met 11/24/23  4.  Pt will amb without antalgic gait pattern submerged in 3.6 ft Baseline:  Goal status:MET - 11/17/23  5.  Pt will demo improved quality of gait including increased toe off and stance time on L LE with reduced favoring of L LE. Goal status: ongoing Target date:  01/23/2024  6.  Pt will demo improved L ankle AROM to at least 0 deg in DF and 7 deg in Eversion for improved stiffness and gait.  Goal status: ongoing Target date:  01/23/2024     LONG TERM GOALS: Target date: 06/13/2024    Pt will demonstrate an improvement in LEFS by at least 9 points for a clinically significant improvement in self perceived disability.  Baseline: 30/80 Goal status: MET  2.  Pt wiil navigate 1 flight of steps using step alternating pattern indep Baseline:  Goal status: ongoing  3.  Pt will demonstrate improved DF AROM to be w/n 5 deg of R ankle for improved stiffness, gait, and performance of stairs.  Baseline: see chart Goal status: IN PROGRESS 4/21  4.  Pt will demo left ankle strength to be at least 4/5 in DF and WFL in PF (tested with manual resistance in open chain) in order for improved performance of and tolerance with functional mobility Baseline: see chart Goal status:  MET  5.  Pt will amb without AD without limitation to pain Baseline:using cane or walker  Goal status: IN PROGRESS (still using cane most   6.  Pt will have improved Oswestry score to >/= 43/50 to demo MCID Baseline: Oswestry Score: 21 / 50 or 42 % Goal status: ongoing  7.  Pt will  be able to tolerate standing tasks for >/=15 min for household chores/tasks Baseline: 2 min Goal status: ongoing  8.  Pt will be able to demo increased hip strength to at least 4/5 for improved standing tolerance Baseline: See MMT above Goal status: ongoing   PLAN:  PT FREQUENCY: 1-2x/wk  PT DURATION: 12 weeks (plan to D/C in 6-8)   PLANNED INTERVENTIONS: 16109- PT Re-evaluation, 97110-Therapeutic exercises, 97530- Therapeutic activity, 97112- Neuromuscular re-education, 97535- Self Care, [id number]- Manual therapy, [id number]- Gait training, [phone]- Orthotic Fit/training, [phone]- Aquatic Therapy, Patient/Family education, Balance training, Stair training, Taping, Dry Needling, Joint mobilization, DME instructions, Cryotherapy, and Moist heat  PLAN FOR NEXT SESSION:  Review HEP and update if tolerable.  Cont with ankle ROM.  Gait training. Ankle stability training.  Continue gross hip and core strengthening.   Trina Fujita III PT, DPT 03/25/24 4:58 PM

## 2024-03-26 DIAGNOSIS — F331 Major depressive disorder, recurrent, moderate: Secondary | ICD-10-CM | POA: Diagnosis not present

## 2024-03-31 ENCOUNTER — Encounter (HOSPITAL_BASED_OUTPATIENT_CLINIC_OR_DEPARTMENT_OTHER): Admitting: Physical Therapy

## 2024-04-02 ENCOUNTER — Encounter (HOSPITAL_BASED_OUTPATIENT_CLINIC_OR_DEPARTMENT_OTHER): Admitting: Physical Therapy

## 2024-04-05 DIAGNOSIS — J4541 Moderate persistent asthma with (acute) exacerbation: Secondary | ICD-10-CM | POA: Diagnosis not present

## 2024-04-05 DIAGNOSIS — R051 Acute cough: Secondary | ICD-10-CM | POA: Diagnosis not present

## 2024-04-06 DIAGNOSIS — F331 Major depressive disorder, recurrent, moderate: Secondary | ICD-10-CM | POA: Diagnosis not present

## 2024-04-09 ENCOUNTER — Ambulatory Visit (HOSPITAL_BASED_OUTPATIENT_CLINIC_OR_DEPARTMENT_OTHER): Attending: Orthopaedic Surgery | Admitting: Physical Therapy

## 2024-04-09 ENCOUNTER — Encounter: Payer: Self-pay | Admitting: Internal Medicine

## 2024-04-09 ENCOUNTER — Encounter (HOSPITAL_BASED_OUTPATIENT_CLINIC_OR_DEPARTMENT_OTHER): Payer: Self-pay | Admitting: Physical Therapy

## 2024-04-09 DIAGNOSIS — M5459 Other low back pain: Secondary | ICD-10-CM | POA: Diagnosis not present

## 2024-04-09 DIAGNOSIS — G4733 Obstructive sleep apnea (adult) (pediatric): Secondary | ICD-10-CM

## 2024-04-09 DIAGNOSIS — R262 Difficulty in walking, not elsewhere classified: Secondary | ICD-10-CM

## 2024-04-09 DIAGNOSIS — M25672 Stiffness of left ankle, not elsewhere classified: Secondary | ICD-10-CM | POA: Diagnosis not present

## 2024-04-09 DIAGNOSIS — M6281 Muscle weakness (generalized): Secondary | ICD-10-CM

## 2024-04-09 DIAGNOSIS — M25572 Pain in left ankle and joints of left foot: Secondary | ICD-10-CM

## 2024-04-09 NOTE — Therapy (Signed)
 OUTPATIENT PHYSICAL THERAPY LOWER EXTREMITY TREATMENT          Patient Name: Haley White MRN: [id number] DOB:[birth year], 68 y.o., female Today's Date: 04/09/2024  END OF SESSION:  PT End of Session - 04/09/24 0943     Visit Number 26    Number of Visits 32    Date for PT Re-Evaluation 06/07/24    Authorization Type aetna mcr    PT Start Time [phone]    PT Stop Time 1025    PT Time Calculation (min) 44 min    Activity Tolerance Patient tolerated treatment well    Behavior During Therapy WFL for tasks assessed/performed                  Past Medical History:  Diagnosis Date   Anxiety    Arthritis    Asthma    Cataract    Depression    Gout    Hyperlipidemia    Hypertension    Obese    Sleep apnea    Past Surgical History:  Procedure Laterality Date   ANKLE SURGERY Left    APPENDECTOMY  1984   APPENDECTOMY     CATARACT EXTRACTION W/ INTRAOCULAR LENS  IMPLANT, BILATERAL  2014   CESAREAN SECTION  1984, 1987, 1994   X3   CESAREAN SECTION     x3   COLONOSCOPY  11/16/2013   w/Brodie    EXTERNAL FIXATION LEG Left 06/04/2023   Procedure: EXTERNAL FIXATION LEG;  Surgeon: Ali Ink, MD;  Location: [facility];  Service: Orthopedics;  Laterality: Left;   EYE SURGERY Bilateral    lasix   FOOT FRACTURE SURGERY Left    FOOT SURGERY Left 2002   FRACTURE SURGERY Left    left ankle   HARDWARE REMOVAL Left 12/03/2023   Procedure: HARDWARE REMOVAL OF SYNDEMOSIS;  Surgeon: Ali Ink, MD;  Location: [facility];  Service: Orthopedics;  Laterality: Left;   KNEE SURGERY Left 1998   ORIF ANKLE FRACTURE Left 06/04/2023   Procedure: OPEN REDUCTION INTERNAL FIXATION (ORIF) ANKLE FRACTURE vs EX FIX;  Surgeon: Ali Ink, MD;  Location: [facility];  Service: Orthopedics;  Laterality: Left;   ROTATOR CUFF REPAIR Right 2007   shoulder     torn rotator cuff   TUBAL LIGATION  1994   TUBAL LIGATION     Patient Active Problem List   Diagnosis Date  Noted   OSA on CPAP 08/11/2023   Ankle fracture 06/03/2023   Trimalleolar fracture of left ankle 06/02/2023   Acute left ankle pain 06/02/2023   Sleep-related hypoxia 03/24/2023   At risk for obstructive sleep apnea 03/24/2023   Acute pulmonary embolism (HCC) 05/10/2022   Allergic rhinitis 03/15/2021   Gastro-esophageal reflux disease without esophagitis 03/15/2021   Mild persistent asthma, uncomplicated 03/15/2021   Aortic atherosclerosis (HCC) by Chest CT scan on 02/02/2018  01/10/2021   Asthma    Low back pain 03/17/2019   Lumbosacral spondylosis without myelopathy 03/17/2019   Pseudogout 12/09/2017   Major depressive disorder, recurrent, severe without psychotic features (HCC)    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   Suicide attempt by drug ingestion (HCC)    Vitamin D  deficiency 08/22/2014   Hyperlipidemia, mixed 08/22/2014   Hypertension    Anxiety    Abnormal glucose    Class 2 severe obesity due to excess calories with serious comorbidity and body mass index (BMI) of 37.0 to 37.9 in adult [facility])     PCP: Vangie Genet MD  REFERRING  PROVIDER: Ali Ink, MD (for ankle) Bettejane Brownie, MD (for low back)  REFERRING DIAG: S82.852D (ICD-10-CM) - Displaced trimalleolar fracture of left lower leg, subsequent encounter for closed fracture with routine healing   THERAPY DIAG:  Other low back pain  Pain in left ankle and joints of left foot  Stiffness of left ankle, not elsewhere classified  Muscle weakness (generalized)  Difficulty in walking, not elsewhere classified  Rationale for Evaluation and Treatment: Rehabilitation  ONSET DATE: July 2024  SUBJECTIVE:   SUBJECTIVE STATEMENT: Pt states she is "hurting".  Pt states she was at [facility] last week.  Pt reports increased pain from the rainy weather.  Pt is seeing Dr. Vaughn Georges on 6/13.  Pt states she felt better after prior Rx and the massage helped. Her biggest problem with her foot is difficulty  going down stairs.  She has to turn sideways to descend stairs.  "When there are good days, they are better than what they used to be."  Pt is swelling and has pain in anteromedial and medial ankle.  Pt has back pain with standing though is improving.  She was able to stand 10-15 mins to clean home.      PERTINENT HISTORY: The patient is s/p left ankle trimalleolar & syndesmosis ORIF, DOS: 06/05/2023.  Removal of deep hardware on 12/03/2023 Hx of foot/ankle surgery in 2022  Fibromyalgia, HTN, PE s/p covid in 2023 LBP with MRI (10/2022) evidence of chronic mild anterior wedge compression fracture of T12 and chronic anterior wedge compression deformity of L1   PAIN:  Are you having pain? Yes:  NPRS scale: 6/10  Pain location:  low back and R glute Pain description: sharp, dull, nagging, ache  4/10 Pain location: medial and lateral L ankle Pain description: sharp and dull depending on what she does   PRECAUTIONS: None   WEIGHT BEARING RESTRICTIONS: WBAT  FALLS:  Has patient fallen in last 6 months? Yes. Number of falls 1 fell down wet hill walking dog  LIVING ENVIRONMENT: Lives with: lives alone Lives in: House/apartment Stairs: Yes: External: 3 steps; none Has following equipment at home: Single point cane   OCCUPATION: retired   PLOF: Independent  PATIENT GOALS: walk normally and as pain free as able  NEXT MD VISIT: June 2025  OBJECTIVE:  Note: Objective measures were completed at Evaluation unless otherwise noted.  MRI IMPRESSION: 1. No acute findings or clear explanation for the patient's symptoms. Overall findings are similar to previous CT from 2020. 2. Interval development of a healed mild superior endplate compression deformity at L1 since 2020. No acute osseous findings. 3. Multilevel spondylosis as described, most advanced at L4-5 where there is mild spinal stenosis and asymmetric lateral recess and foraminal narrowing on the left with suspected chronic left  L4 nerve root encroachment. 4. Mild asymmetric lateral recess and foraminal narrowing on the left at L5-S1 without definite nerve root encroachment.   LOWER EXTREMITY ROM:   Active ROM Right eval Left eval Left  11/17/23 Right 2/28 Left 2/28 AROM/PROM Left 4/21 AROM  L 5/12   Hip flexion              Hip extension              Hip abduction              Hip adduction              Hip internal rotation  Hip external rotation              Knee flexion              Knee extension              Ankle dorsiflexion   -8 Lacking 2 14 Lacking 7 deg / 3  with pain Lacking 1 degree/pain with AAROM  4  Ankle plantarflexion   30d 42 65 55 65deg 65  Ankle inversion   Limited by 50%   43 24 12 17   Ankle eversion   Limited by 50%   10 2 with pain 12 5 pain   (Blank rows = not tested)  LEFS 4/21: 14/80 LEFS: Lower Extremity Functional Score: 30 / 80 = 37.5 % ODI Score = 17 points (34%)   MMT 4+ throughout L ankle with pain into EV  TODAY'S TREATMENT:   6/6 Reviewed current function, pain levels, and response to prior Rx.  Seated BAPS 2x10 cw, ccw, and f/b each Standing heel raises x 5  Seated heel raises x 6, 3# x10 Ankle DF with GTB 2x10, Inv with GTB 2x10, Eve with RTB 2x10  Supine bridge x 8 Supine gentle lumbar rotation x 10 reps PPT x 10   Manual Therapy:  STM to bilat lumbar paraspinals with very gentle STM to R sided lumbar in prone.   5/21 Manual Therapy:  STM to bilat lumbar paraspinals with very gentle STM to R sided lumbar.  STM with gentle TPR to R glute.  MT performed with pt in prone.  Reviewed pt presentation, pain levels, and response to prior Rx.  Seated BAPS board 2x10 each cw, ccw, and f/b. Supine PPT with TrA 2x10 Seated ankle DF with GTB 2x10, Inv with GTB 2x10, Eve with RTB 2x10 Supine manual HS stretch 2x30 sec bilat      5/19 Reviewed pt presentation, pain levels, and response to prior Rx.  Pt received L ankle DF, PF, Eve,  and Inv PROM. Seated BAPS board 2x10 each cw, ccw, and f/b. Attempted bridge with TrA though stopped due to pain Supine PPT with TrA 2x10 Supine clams with TrA with RTB 2x10 Supine manual HS stretch 2x30 sec bilat Seated ankle DF with GTB 2x10, Inv with GTB 2x10, Eve with RTB 2x10  5/14  R LE LAD with ABD 4x round  PPT 2s 2x10 Curl up 2x10 Bridge 3x10 Seated GTB hip ABD iso 5s 2x10 Seated fwd flexion lumbar stretch 10x 5s  SKTC with towel 15s 4x Seated quadratus 15s 4x  Seated LAQ with ankle pump for nerve glide 2x10   5/12  Edu of exam results, foot biomechanics and pain, footwear options, HEP modifications  Gait with heel lift 3 layers, focus on medial pressure at toe off Standing gastroc stretch 30s 3x   02/27/24 Nustep L5 x 6 min UEs/LEs Seated hamstring stretch x 30" Seated hip flexor stretch x 30" Standing heel/toe raise x10 Seated ankle eversion green TB 2x10 Seated ankle inversion green TB 2x10 Seated pball press down 10x5" Seated pball press down opposite arm/leg 10x5" Palloff press attempted but could feel pull on low back Standing shoulder ext red TB in partial tandem stance 2x10  02/23/24 Prone lying x76min Trialed prone on elbows, but painful after ~50seconds Prone hip extension x10 L LE, trialed on R but painful  -Supine piriformis stretch- attempted, but painful -LTR gentle -Hooklying PPT x15 Updated ankle ROM Seated HR/TR 2x10ea Standing gastroc stretch  PATIENT EDUCATION:  Education details:  exercise form, relevant anatomy Person educated: Patient Education method: Explanation, Demonstration, Tactile cues, Verbal cues Education comprehension: verbalized understanding, returned demonstration, verbal cues required, tactile cues required, and needs further education     HOME EXERCISE PROGRAM: Access Code: [email or web address] URL: [email or web address] Date: 02/27/2024 Prepared by: Gellen April Erman [place]  Exercises - Toe Yoga -  Alternating Great Toe and Lesser Toe Extension  - 2 x daily - 7 x weekly - 1 sets - 10 reps - Lying Prone  - 1 x daily - 7 x weekly - 1 sets - 5 min hold - Prone Press Up On Elbows  - 1 x daily - 7 x weekly - 1-2 sets - 10 reps - Prone Hip Abduction on Slider  - 1 x daily - 7 x weekly - 2 sets - 10 reps - Supine Piriformis Stretch with Foot on Ground  - 1 x daily - 7 x weekly - 2 sets - 30 sec hold - Hooklying Gluteal Sets  - 1 x daily - 7 x weekly - 2 sets - 10 reps - Supine Transversus Abdominis Bracing - Hands on Stomach  - 1 x daily - 7 x weekly - 2 sets - 10 reps - 3-5 sec hold - Seated Ankle Eversion with Resistance  - 1 x daily - 7 x weekly - 2 sets - 10 reps - Seated Ankle Inversion with Resistance  - 1 x daily - 7 x weekly - 2 sets - 10 reps - Heel Toe Raises with Unilateral Counter Support  - 1 x daily - 7 x weekly - 2 sets - 10 reps - Shoulder extension with resistance - Neutral  - 1 x daily - 7 x weekly - 2 sets - 10 reps   ASSESSMENT:  CLINICAL IMPRESSION: Pt continues to have lumbar pain and reports increased pain with rainy weather.   Pt is limited with standing and has back pain with standing though is improving. Pt reports having difficulty descending stairs due to ankle.  Pt states her good day are better than what they used to be.  Pt performed exercises to improve ankle and lumbar mobility, ankle and core strength, and pain.  She had pain with standing heel raises and PT had pt stop that exercise.  Pt had pain with bridging and Pt had pt stop that exercise also.  Pt then began to perform gentle supine lumbar rotation which she performs at home and states that makes her back feel better.  Pt is initially limited with ankle ROM with T-band eversion and inversion which improved with increased reps during  the set.  PT performed STM at the end of treatment.  Pt is very tender and sensitive with soft tissue work on R sided lumbar paraspinals.  PT performed very gentle STM with light  pressure to R sided lumbar paraspinals.  Pt states she her back felt better after STM.  Pt should benefit from cont skilled PT to address ongoing goals and impairments and overall function.     OBJECTIVE IMPAIRMENTS: Abnormal gait, decreased activity tolerance, decreased balance, difficulty walking, decreased ROM, decreased strength, obesity, and pain.   ACTIVITY LIMITATIONS: squatting, stairs, and locomotion level  PARTICIPATION LIMITATIONS: shopping and community activity  PERSONAL FACTORS: Past/current experiences and 1 comorbidity: back dysfunction are also affecting patient's functional outcome.   REHAB POTENTIAL: Good  CLINICAL DECISION MAKING: Evolving/moderate complexity  EVALUATION COMPLEXITY: Moderate   GOALS:  SHORT TERM GOALS: Target date: 11/24/23 Pt  will tolerate full aquatic sessions consistently without increase in pain and with improving function to demonstrate good toleration and effectiveness of intervention.  Baseline: Goal status: Met 11/24/23  2.  Pt will navigate stair into and out of pool 6 steps using step through pattern Baseline: step-to pattern Goal status: In progress 11/10/23. Partially met 11/24/23  3.  Pt will report reduction in pain by 3 NPRS post aquatic session which lasts for up to 24 hours. Baseline:  Goal status: Met 11/24/23  4.  Pt will amb without antalgic gait pattern submerged in 3.6 ft Baseline:  Goal status:MET - 11/17/23  5.  Pt will demo improved quality of gait including increased toe off and stance time on L LE with reduced favoring of L LE. Goal status: ongoing Target date:  01/23/2024  6.  Pt will demo improved L ankle AROM to at least 0 deg in DF and 7 deg in Eversion for improved stiffness and gait.  Goal status: ongoing Target date:  01/23/2024     LONG TERM GOALS: Target date: 06/13/2024    Pt will demonstrate an improvement in LEFS by at least 9 points for a clinically significant improvement in self perceived  disability.  Baseline: 30/80 Goal status: MET  2.  Pt wiil navigate 1 flight of steps using step alternating pattern indep Baseline:  Goal status: ongoing  3.  Pt will demonstrate improved DF AROM to be w/n 5 deg of R ankle for improved stiffness, gait, and performance of stairs.  Baseline: see chart Goal status: IN PROGRESS 4/21  4.  Pt will demo left ankle strength to be at least 4/5 in DF and WFL in PF (tested with manual resistance in open chain) in order for improved performance of and tolerance with functional mobility Baseline: see chart Goal status:  MET  5.  Pt will amb without AD without limitation to pain Baseline:using cane or walker  Goal status: IN PROGRESS (still using cane most   6.  Pt will have improved Oswestry score to >/= 43/50 to demo MCID Baseline: Oswestry Score: 21 / 50 or 42 % Goal status: ongoing  7.  Pt will be able to tolerate standing tasks for >/=15 min for household chores/tasks Baseline: 2 min Goal status: ongoing  8.  Pt will be able to demo increased hip strength to at least 4/5 for improved standing tolerance Baseline: See MMT above Goal status: ongoing   PLAN:  PT FREQUENCY: 1-2x/wk  PT DURATION: 12 weeks (plan to D/C in 6-8)   PLANNED INTERVENTIONS: 16109- PT Re-evaluation, 97110-Therapeutic exercises, 97530- Therapeutic activity, 97112- Neuromuscular re-education, 97535- Self Care, [id number]- Manual therapy, [id number]- Gait training, [phone]- Orthotic Fit/training, [phone]- Aquatic Therapy, Patient/Family education, Balance training, Stair training, Taping, Dry Needling, Joint mobilization, DME instructions, Cryotherapy, and Moist heat  PLAN FOR NEXT SESSION:  Review HEP and update if tolerable.  Cont with ankle ROM.  Gait training. Ankle stability training.  Continue gross hip and core strengthening.  Pt is seeing Dr. Vaughn Georges on 6/13.   Trina Fujita III PT, DPT 04/09/24 7:34 PM

## 2024-04-12 ENCOUNTER — Ambulatory Visit (HOSPITAL_BASED_OUTPATIENT_CLINIC_OR_DEPARTMENT_OTHER)

## 2024-04-12 ENCOUNTER — Encounter (HOSPITAL_BASED_OUTPATIENT_CLINIC_OR_DEPARTMENT_OTHER): Payer: Self-pay

## 2024-04-12 DIAGNOSIS — R262 Difficulty in walking, not elsewhere classified: Secondary | ICD-10-CM | POA: Diagnosis not present

## 2024-04-12 DIAGNOSIS — M6281 Muscle weakness (generalized): Secondary | ICD-10-CM

## 2024-04-12 DIAGNOSIS — M25572 Pain in left ankle and joints of left foot: Secondary | ICD-10-CM

## 2024-04-12 DIAGNOSIS — M5459 Other low back pain: Secondary | ICD-10-CM | POA: Diagnosis not present

## 2024-04-12 DIAGNOSIS — M25672 Stiffness of left ankle, not elsewhere classified: Secondary | ICD-10-CM

## 2024-04-12 NOTE — Therapy (Signed)
 OUTPATIENT PHYSICAL THERAPY LOWER EXTREMITY TREATMENT          Patient Name: Haley White MRN: [id number] DOB:December 16, 1955, 68 y.o., female Today's Date: 04/12/2024  END OF SESSION:  PT End of Session - 04/12/24 1436     Visit Number 27    Number of Visits 32    Date for PT Re-Evaluation 06/07/24    Authorization Type aetna mcr    Progress Note Due on Visit 29    PT Start Time 1435    PT Stop Time 1517    PT Time Calculation (min) 42 min    Activity Tolerance Patient tolerated treatment well    Behavior During Therapy WFL for tasks assessed/performed                   Past Medical History:  Diagnosis Date   Anxiety    Arthritis    Asthma    Cataract    Depression    Gout    Hyperlipidemia    Hypertension    Obese    Sleep apnea    Past Surgical History:  Procedure Laterality Date   ANKLE SURGERY Left    APPENDECTOMY  1984   APPENDECTOMY     CATARACT EXTRACTION W/ INTRAOCULAR LENS  IMPLANT, BILATERAL  2014   CESAREAN SECTION  1984, 1987, 1994   X3   CESAREAN SECTION     x3   COLONOSCOPY  11/16/2013   w/Brodie    EXTERNAL FIXATION LEG Left 06/04/2023   Procedure: EXTERNAL FIXATION LEG;  Surgeon: Ali Ink, MD;  Location: [facility];  Service: Orthopedics;  Laterality: Left;   EYE SURGERY Bilateral    lasix   FOOT FRACTURE SURGERY Left    FOOT SURGERY Left 2002   FRACTURE SURGERY Left    left ankle   HARDWARE REMOVAL Left 12/03/2023   Procedure: HARDWARE REMOVAL OF SYNDEMOSIS;  Surgeon: Ali Ink, MD;  Location: [facility];  Service: Orthopedics;  Laterality: Left;   KNEE SURGERY Left 1998   ORIF ANKLE FRACTURE Left 06/04/2023   Procedure: OPEN REDUCTION INTERNAL FIXATION (ORIF) ANKLE FRACTURE vs EX FIX;  Surgeon: Ali Ink, MD;  Location: [facility];  Service: Orthopedics;  Laterality: Left;   ROTATOR CUFF REPAIR Right 2007   shoulder     torn rotator cuff   TUBAL LIGATION  1994   TUBAL LIGATION     Patient  Active Problem List   Diagnosis Date Noted   OSA on CPAP 08/11/2023   Ankle fracture 06/03/2023   Trimalleolar fracture of left ankle 06/02/2023   Acute left ankle pain 06/02/2023   Sleep-related hypoxia 03/24/2023   At risk for obstructive sleep apnea 03/24/2023   Acute pulmonary embolism (HCC) 05/10/2022   Allergic rhinitis 03/15/2021   Gastro-esophageal reflux disease without esophagitis 03/15/2021   Mild persistent asthma, uncomplicated 03/15/2021   Aortic atherosclerosis (HCC) by Chest CT scan on 02/02/2018  01/10/2021   Asthma    Low back pain 03/17/2019   Lumbosacral spondylosis without myelopathy 03/17/2019   Pseudogout 12/09/2017   Major depressive disorder, recurrent, severe without psychotic features (HCC)    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   Suicide attempt by drug ingestion (HCC)    Vitamin D  deficiency 08/22/2014   Hyperlipidemia, mixed 08/22/2014   Hypertension    Anxiety    Abnormal glucose    Class 2 severe obesity due to excess calories with serious comorbidity and body mass index (BMI) of 37.0 to 37.9 in adult [facility])  PCP: Vangie Genet MD  REFERRING PROVIDER: Ali Ink, MD (for ankle) Bettejane Brownie, MD (for low back)  REFERRING DIAG: [phone] (ICD-10-CM) - Displaced trimalleolar fracture of left lower leg, subsequent encounter for closed fracture with routine healing   THERAPY DIAG:  Other low back pain  Pain in left ankle and joints of left foot  Stiffness of left ankle, not elsewhere classified  Muscle weakness (generalized)  Difficulty in walking, not elsewhere classified  Rationale for Evaluation and Treatment: Rehabilitation  ONSET DATE: July 2024  SUBJECTIVE:   SUBJECTIVE STATEMENT: Pt states she is "hurting".  Pt states she was at [facility] last week.  Pt reports increased pain from the rainy weather.  Pt is seeing Dr. Vaughn Georges on 6/13.  Pt states she felt better after prior Rx and the massage helped. Her biggest  problem with her foot is difficulty going down stairs.  She has to turn sideways to descend stairs.  "When there are good days, they are better than what they used to be."  Pt is swelling and has pain in anteromedial and medial ankle.  Pt has back pain with standing though is improving.  She was able to stand 10-15 mins to clean home.      PERTINENT HISTORY: The patient is s/p left ankle trimalleolar & syndesmosis ORIF, DOS: 06/05/2023.  Removal of deep hardware on 12/03/2023 Hx of foot/ankle surgery in 2022  Fibromyalgia, HTN, PE s/p covid in 2023 LBP with MRI (10/2022) evidence of chronic mild anterior wedge compression fracture of T12 and chronic anterior wedge compression deformity of L1   PAIN:  Are you having pain? Yes:  NPRS scale: 6/10  Pain location:  low back and R glute Pain description: sharp, dull, nagging, ache  4/10 Pain location: medial and lateral L ankle Pain description: sharp and dull depending on what she does   PRECAUTIONS: None   WEIGHT BEARING RESTRICTIONS: WBAT  FALLS:  Has patient fallen in last 6 months? Yes. Number of falls 1 fell down wet hill walking dog  LIVING ENVIRONMENT: Lives with: lives alone Lives in: House/apartment Stairs: Yes: External: 3 steps; none Has following equipment at home: Single point cane   OCCUPATION: retired   PLOF: Independent  PATIENT GOALS: walk normally and as pain free as able  NEXT MD VISIT: June 2025  OBJECTIVE:  Note: Objective measures were completed at Evaluation unless otherwise noted.  MRI IMPRESSION: 1. No acute findings or clear explanation for the patient's symptoms. Overall findings are similar to previous CT from 2020. 2. Interval development of a healed mild superior endplate compression deformity at L1 since 2020. No acute osseous findings. 3. Multilevel spondylosis as described, most advanced at L4-5 where there is mild spinal stenosis and asymmetric lateral recess and foraminal narrowing on  the left with suspected chronic left L4 nerve root encroachment. 4. Mild asymmetric lateral recess and foraminal narrowing on the left at L5-S1 without definite nerve root encroachment.   LOWER EXTREMITY ROM:   Active ROM Right eval Left eval Left  11/17/23 Right 2/28 Left 2/28 AROM/PROM Left 4/21 AROM  L 5/12   Hip flexion              Hip extension              Hip abduction              Hip adduction              Hip internal rotation  Hip external rotation              Knee flexion              Knee extension              Ankle dorsiflexion   -8 Lacking 2 14 Lacking 7 deg / 3  with pain Lacking 1 degree/pain with AAROM  4  Ankle plantarflexion   30d 42 65 55 65deg 65  Ankle inversion   Limited by 50%   43 24 12 17   Ankle eversion   Limited by 50%   10 2 with pain 12 5 pain   (Blank rows = not tested)  LEFS 4/21: 14/80 LEFS: Lower Extremity Functional Score: 30 / 80 = 37.5 % ODI Score = 17 points (34%)   MMT 4+ throughout L ankle with pain into EV  TODAY'S TREATMENT:   6/9 STM to post tib (very gentle) Stm to plantar fasciitis (very gentle)  Plantar fascia stretching Gait training (cues for neutral foot placement, heel contact, and proper toe off) Weight bearing DF with manual posterior glide x10 DF stretch manual open chain Gentle talocrural mob posterior glide open chain (careful of cyst) Piriformis stretch LTR  DF/PF GTB x10ea    6/6 Reviewed current function, pain levels, and response to prior Rx.  Seated BAPS 2x10 cw, ccw, and f/b each Standing heel raises x 5  Seated heel raises x 6, 3# x10 Ankle DF with GTB 2x10, Inv with GTB 2x10, Eve with RTB 2x10  Supine bridge x 8 Supine gentle lumbar rotation x 10 reps PPT x 10   Manual Therapy:  STM to bilat lumbar paraspinals with very gentle STM to R sided lumbar in prone.   5/21 Manual Therapy:  STM to bilat lumbar paraspinals with very gentle STM to R sided lumbar.  STM with gentle  TPR to R glute.  MT performed with pt in prone.  Reviewed pt presentation, pain levels, and response to prior Rx.  Seated BAPS board 2x10 each cw, ccw, and f/b. Supine PPT with TrA 2x10 Seated ankle DF with GTB 2x10, Inv with GTB 2x10, Eve with RTB 2x10 Supine manual HS stretch 2x30 sec bilat      PATIENT EDUCATION:  Education details:  exercise form, relevant anatomy Person educated: Patient Education method: Explanation, Demonstration, Tactile cues, Verbal cues Education comprehension: verbalized understanding, returned demonstration, verbal cues required, tactile cues required, and needs further education     HOME EXERCISE PROGRAM: Access Code: [email or web address] URL: [email or web address] Date: 02/27/2024 Prepared by: Gellen April Marie Nonato  Exercises - Toe Yoga - Alternating Great Toe and Lesser Toe Extension  - 2 x daily - 7 x weekly - 1 sets - 10 reps - Lying Prone  - 1 x daily - 7 x weekly - 1 sets - 5 min hold - Prone Press Up On Elbows  - 1 x daily - 7 x weekly - 1-2 sets - 10 reps - Prone Hip Abduction on Slider  - 1 x daily - 7 x weekly - 2 sets - 10 reps - Supine Piriformis Stretch with Foot on Ground  - 1 x daily - 7 x weekly - 2 sets - 30 sec hold - Hooklying Gluteal Sets  - 1 x daily - 7 x weekly - 2 sets - 10 reps - Supine Transversus Abdominis Bracing - Hands on Stomach  - 1 x daily - 7 x weekly - 2 sets - 10 reps -  3-5 sec hold - Seated Ankle Eversion with Resistance  - 1 x daily - 7 x weekly - 2 sets - 10 reps - Seated Ankle Inversion with Resistance  - 1 x daily - 7 x weekly - 2 sets - 10 reps - Heel Toe Raises with Unilateral Counter Support  - 1 x daily - 7 x weekly - 2 sets - 10 reps - Shoulder extension with resistance - Neutral  - 1 x daily - 7 x weekly - 2 sets - 10 reps   ASSESSMENT:  CLINICAL IMPRESSION: Patient presents with symptoms consistent with posterior tibialis syndrome.  Patient also significantly tight in plantar fascia with  history of plantar fasciitis.  Very tender to palpation of these areas as well as right hip.  Observed gait with notable pronation and avoidance of neutral LE weightbearing.  Spent time on correcting this with verbal cueing.  Good tolerance for weightbearing MWM for proved available dorsiflexion.  Patient does demonstrate ganglion cyst on dorsal foot which she reports is extremely tender in nature.  Advised her to bring this up at upcoming MD visit as well as her post tib symptoms.  With resisted dorsiflexion patient was unable to perform the movement actively and required assistance for neuromuscular re-ed prior to performance.  Patient limited with manual therapy due to her severe hypersensitivity to palpation.  She may benefit from massage therapy with gentle techniques.  Will continue to work on mechanics with gait, range of motion and soft tissue extensibility.    OBJECTIVE IMPAIRMENTS: Abnormal gait, decreased activity tolerance, decreased balance, difficulty walking, decreased ROM, decreased strength, obesity, and pain.   ACTIVITY LIMITATIONS: squatting, stairs, and locomotion level  PARTICIPATION LIMITATIONS: shopping and community activity  PERSONAL FACTORS: Past/current experiences and 1 comorbidity: back dysfunction are also affecting patient's functional outcome.   REHAB POTENTIAL: Good  CLINICAL DECISION MAKING: Evolving/moderate complexity  EVALUATION COMPLEXITY: Moderate   GOALS:  SHORT TERM GOALS: Target date: 11/24/23 Pt will tolerate full aquatic sessions consistently without increase in pain and with improving function to demonstrate good toleration and effectiveness of intervention.  Baseline: Goal status: Met 11/24/23  2.  Pt will navigate stair into and out of pool 6 steps using step through pattern Baseline: step-to pattern Goal status: In progress 11/10/23. Partially met 11/24/23  3.  Pt will report reduction in pain by 3 NPRS post aquatic session which lasts for up to  24 hours. Baseline:  Goal status: Met 11/24/23  4.  Pt will amb without antalgic gait pattern submerged in 3.6 ft Baseline:  Goal status:MET - 11/17/23  5.  Pt will demo improved quality of gait including increased toe off and stance time on L LE with reduced favoring of L LE. Goal status: ongoing Target date:  01/23/2024  6.  Pt will demo improved L ankle AROM to at least 0 deg in DF and 7 deg in Eversion for improved stiffness and gait.  Goal status: ongoing Target date:  01/23/2024     LONG TERM GOALS: Target date: 06/13/2024    Pt will demonstrate an improvement in LEFS by at least 9 points for a clinically significant improvement in self perceived disability.  Baseline: 30/80 Goal status: MET  2.  Pt wiil navigate 1 flight of steps using step alternating pattern indep Baseline:  Goal status: ongoing  3.  Pt will demonstrate improved DF AROM to be w/n 5 deg of R ankle for improved stiffness, gait, and performance of stairs.  Baseline: see chart  Goal status: IN PROGRESS 4/21  4.  Pt will demo left ankle strength to be at least 4/5 in DF and WFL in PF (tested with manual resistance in open chain) in order for improved performance of and tolerance with functional mobility Baseline: see chart Goal status:  MET  5.  Pt will amb without AD without limitation to pain Baseline:using cane or walker  Goal status: IN PROGRESS (still using cane most   6.  Pt will have improved Oswestry score to >/= 43/50 to demo MCID Baseline: Oswestry Score: 21 / 50 or 42 % Goal status: ongoing  7.  Pt will be able to tolerate standing tasks for >/=15 min for household chores/tasks Baseline: 2 min Goal status: ongoing  8.  Pt will be able to demo increased hip strength to at least 4/5 for improved standing tolerance Baseline: See MMT above Goal status: ongoing   PLAN:  PT FREQUENCY: 1-2x/wk  PT DURATION: 12 weeks (plan to D/C in 6-8)   PLANNED INTERVENTIONS: 14782- PT Re-evaluation,  97110-Therapeutic exercises, 97530- Therapeutic activity, 97112- Neuromuscular re-education, 97535- Self Care, 95621- Manual therapy, [id number]- Gait training, [phone]- Orthotic Fit/training, [phone]- Aquatic Therapy, Patient/Family education, Balance training, Stair training, Taping, Dry Needling, Joint mobilization, DME instructions, Cryotherapy, and Moist heat  PLAN FOR NEXT SESSION:  Review HEP and update if tolerable.  Cont with ankle ROM.  Gait training. Ankle stability training.  Continue gross hip and core strengthening.  Pt is seeing Dr. Vaughn Georges on 6/13.   Herb Loges, PTA  04/12/24 3:41 PM

## 2024-04-13 ENCOUNTER — Encounter (HOSPITAL_BASED_OUTPATIENT_CLINIC_OR_DEPARTMENT_OTHER)

## 2024-04-13 DIAGNOSIS — G4733 Obstructive sleep apnea (adult) (pediatric): Secondary | ICD-10-CM | POA: Diagnosis not present

## 2024-04-14 NOTE — Telephone Encounter (Signed)
 Download reviewed - can you update her settings to Auto-cpap 5-15 cm H20? Everything else can stay the same. We can try this for now but ultimate she needs a cpap titration study which has already been ordered. Can you make sure this gets scheduled?

## 2024-04-14 NOTE — Telephone Encounter (Signed)
 Good morning, I printed a DL and placed in your folder for review.  Please advise when you have a chance.

## 2024-04-15 ENCOUNTER — Encounter (HOSPITAL_BASED_OUTPATIENT_CLINIC_OR_DEPARTMENT_OTHER): Payer: Self-pay

## 2024-04-15 ENCOUNTER — Ambulatory Visit (HOSPITAL_BASED_OUTPATIENT_CLINIC_OR_DEPARTMENT_OTHER): Payer: Self-pay

## 2024-04-15 DIAGNOSIS — R262 Difficulty in walking, not elsewhere classified: Secondary | ICD-10-CM | POA: Diagnosis not present

## 2024-04-15 DIAGNOSIS — M6281 Muscle weakness (generalized): Secondary | ICD-10-CM

## 2024-04-15 DIAGNOSIS — M25572 Pain in left ankle and joints of left foot: Secondary | ICD-10-CM

## 2024-04-15 DIAGNOSIS — M5459 Other low back pain: Secondary | ICD-10-CM

## 2024-04-15 DIAGNOSIS — J301 Allergic rhinitis due to pollen: Secondary | ICD-10-CM | POA: Diagnosis not present

## 2024-04-15 DIAGNOSIS — M25672 Stiffness of left ankle, not elsewhere classified: Secondary | ICD-10-CM | POA: Diagnosis not present

## 2024-04-15 NOTE — Therapy (Signed)
 OUTPATIENT PHYSICAL THERAPY LOWER EXTREMITY TREATMENT          Patient Name: Haley White MRN: [id number] DOB:November 12, 1955, 68 y.o., female Today's Date: 04/15/2024  END OF SESSION:  PT End of Session - 04/15/24 0809     Visit Number 28    Number of Visits 32    Date for PT Re-Evaluation 06/07/24    Authorization Type aetna mcr    Progress Note Due on Visit 29    PT Start Time 0804    PT Stop Time 0846    PT Time Calculation (min) 42 min    Activity Tolerance Patient tolerated treatment well    Behavior During Therapy WFL for tasks assessed/performed                 Past Medical History:  Diagnosis Date   Anxiety    Arthritis    Asthma    Cataract    Depression    Gout    Hyperlipidemia    Hypertension    Obese    Sleep apnea    Past Surgical History:  Procedure Laterality Date   ANKLE SURGERY Left    APPENDECTOMY  1984   APPENDECTOMY     CATARACT EXTRACTION W/ INTRAOCULAR LENS  IMPLANT, BILATERAL  2014   CESAREAN SECTION  1984, 1987, 1994   X3   CESAREAN SECTION     x3   COLONOSCOPY  11/16/2013   w/Brodie    EXTERNAL FIXATION LEG Left 06/04/2023   Procedure: EXTERNAL FIXATION LEG;  Surgeon: Ali Ink, MD;  Location: [facility];  Service: Orthopedics;  Laterality: Left;   EYE SURGERY Bilateral    lasix   FOOT FRACTURE SURGERY Left    FOOT SURGERY Left 2002   FRACTURE SURGERY Left    left ankle   HARDWARE REMOVAL Left 12/03/2023   Procedure: HARDWARE REMOVAL OF SYNDEMOSIS;  Surgeon: Ali Ink, MD;  Location: [facility];  Service: Orthopedics;  Laterality: Left;   KNEE SURGERY Left 1998   ORIF ANKLE FRACTURE Left 06/04/2023   Procedure: OPEN REDUCTION INTERNAL FIXATION (ORIF) ANKLE FRACTURE vs EX FIX;  Surgeon: Ali Ink, MD;  Location: [facility];  Service: Orthopedics;  Laterality: Left;   ROTATOR CUFF REPAIR Right 2007   shoulder     torn rotator cuff   TUBAL LIGATION  1994   TUBAL LIGATION     Patient  Active Problem List   Diagnosis Date Noted   OSA on CPAP 08/11/2023   Ankle fracture 06/03/2023   Trimalleolar fracture of left ankle 06/02/2023   Acute left ankle pain 06/02/2023   Sleep-related hypoxia 03/24/2023   At risk for obstructive sleep apnea 03/24/2023   Acute pulmonary embolism (HCC) 05/10/2022   Allergic rhinitis 03/15/2021   Gastro-esophageal reflux disease without esophagitis 03/15/2021   Mild persistent asthma, uncomplicated 03/15/2021   Aortic atherosclerosis (HCC) by Chest CT scan on 02/02/2018  01/10/2021   Asthma    Low back pain 03/17/2019   Lumbosacral spondylosis without myelopathy 03/17/2019   Pseudogout 12/09/2017   Major depressive disorder, recurrent, severe without psychotic features (HCC)    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   Suicide attempt by drug ingestion (HCC)    Vitamin D  deficiency 08/22/2014   Hyperlipidemia, mixed 08/22/2014   Hypertension    Anxiety    Abnormal glucose    Class 2 severe obesity due to excess calories with serious comorbidity and body mass index (BMI) of 37.0 to 37.9 in adult [facility])  PCP: Vangie Genet MD  REFERRING PROVIDER: Ali Ink, MD (for ankle) Bettejane Brownie, MD (for low back)  REFERRING DIAG: [phone] (ICD-10-CM) - Displaced trimalleolar fracture of left lower leg, subsequent encounter for closed fracture with routine healing   THERAPY DIAG:  Muscle weakness (generalized)  Difficulty in walking, not elsewhere classified  Other low back pain  Pain in left ankle and joints of left foot  Stiffness of left ankle, not elsewhere classified  Rationale for Evaluation and Treatment: Rehabilitation  ONSET DATE: July 2024  SUBJECTIVE:   SUBJECTIVE STATEMENT: Pt reports improved pain level overall today. Arrives with purchased items to show PTA for advisement.     PERTINENT HISTORY: The patient is s/p left ankle trimalleolar & syndesmosis ORIF, DOS: 06/05/2023.  Removal of deep hardware on  12/03/2023 Hx of foot/ankle surgery in 2022  Fibromyalgia, HTN, PE s/p covid in 2023 LBP with MRI (10/2022) evidence of chronic mild anterior wedge compression fracture of T12 and chronic anterior wedge compression deformity of L1   PAIN:  Are you having pain? Yes:  NPRS scale: 6/10  Pain location:  low back and R glute Pain description: sharp, dull, nagging, ache  4/10 Pain location: medial and lateral L ankle Pain description: sharp and dull depending on what she does   PRECAUTIONS: None   WEIGHT BEARING RESTRICTIONS: WBAT  FALLS:  Has patient fallen in last 6 months? Yes. Number of falls 1 fell down wet hill walking dog  LIVING ENVIRONMENT: Lives with: lives alone Lives in: House/apartment Stairs: Yes: External: 3 steps; none Has following equipment at home: Single point cane   OCCUPATION: retired   PLOF: Independent  PATIENT GOALS: walk normally and as pain free as able  NEXT MD VISIT: June 2025  OBJECTIVE:  Note: Objective measures were completed at Evaluation unless otherwise noted.  MRI IMPRESSION: 1. No acute findings or clear explanation for the patient's symptoms. Overall findings are similar to previous CT from 2020. 2. Interval development of a healed mild superior endplate compression deformity at L1 since 2020. No acute osseous findings. 3. Multilevel spondylosis as described, most advanced at L4-5 where there is mild spinal stenosis and asymmetric lateral recess and foraminal narrowing on the left with suspected chronic left L4 nerve root encroachment. 4. Mild asymmetric lateral recess and foraminal narrowing on the left at L5-S1 without definite nerve root encroachment.   LOWER EXTREMITY ROM:   Active ROM Right eval Left eval Left  11/17/23 Right 2/28 Left 2/28 AROM/PROM Left 4/21 AROM  L 5/12   Hip flexion              Hip extension              Hip abduction              Hip adduction              Hip internal rotation               Hip external rotation              Knee flexion              Knee extension              Ankle dorsiflexion   -8 Lacking 2 14 Lacking 7 deg / 3  with pain Lacking 1 degree/pain with AAROM  4  Ankle plantarflexion   30d 42 65 55 65deg 65  Ankle inversion   Limited by 50%  43 24 12 17   Ankle eversion   Limited by 50%   10 2 with pain 12 5 pain   (Blank rows = not tested)  LEFS 4/21: 14/80 LEFS: Lower Extremity Functional Score: 30 / 80 = 37.5 % ODI Score = 17 points (34%)   MMT 4+ throughout L ankle with pain into EV  TODAY'S TREATMENT:  6/12 STM/IASTM (lacrosse ball from pt) to post tib (very gentle) Stm to plantar fasciitis (very gentle)  Plantar fascia stretching Gait training (cues for neutral foot placement, heel contact, and proper toe off) Weight bearing DF with manual posterior glide x10 DF stretch manual open chain Gentle talocrural mob posterior glide open chain (careful of cyst) Piriformis stretch SKTC (unable with r LE) PPT 5 x10 LTR  Lacrosse ball under glutes, then LTR again with decreased pain.  Gait in hall with cues for toe off and heel contact   6/9 STM to post tib (very gentle) Stm to plantar fasciitis (very gentle)  Plantar fascia stretching Gait training (cues for neutral foot placement, heel contact, and proper toe off) Weight bearing DF with manual posterior glide x10 DF stretch manual open chain Gentle talocrural mob posterior glide open chain (careful of cyst) Piriformis stretch LTR  DF/PF GTB x10ea    6/6 Reviewed current function, pain levels, and response to prior Rx.  Seated BAPS 2x10 cw, ccw, and f/b each Standing heel raises x 5  Seated heel raises x 6, 3# x10 Ankle DF with GTB 2x10, Inv with GTB 2x10, Eve with RTB 2x10  Supine bridge x 8 Supine gentle lumbar rotation x 10 reps PPT x 10   Manual Therapy:  STM to bilat lumbar paraspinals with very gentle STM to R sided lumbar in prone.   5/21 Manual Therapy:  STM to bilat  lumbar paraspinals with very gentle STM to R sided lumbar.  STM with gentle TPR to R glute.  MT performed with pt in prone.  Reviewed pt presentation, pain levels, and response to prior Rx.  Seated BAPS board 2x10 each cw, ccw, and f/b. Supine PPT with TrA 2x10 Seated ankle DF with GTB 2x10, Inv with GTB 2x10, Eve with RTB 2x10 Supine manual HS stretch 2x30 sec bilat      PATIENT EDUCATION:  Education details:  exercise form, relevant anatomy Person educated: Patient Education method: Explanation, Demonstration, Tactile cues, Verbal cues Education comprehension: verbalized understanding, returned demonstration, verbal cues required, tactile cues required, and needs further education     HOME EXERCISE PROGRAM: Access Code: [email or web address] URL: [email or web address] Date: 02/27/2024 Prepared by: Gellen April Marie Nonato  Exercises - Toe Yoga - Alternating Great Toe and Lesser Toe Extension  - 2 x daily - 7 x weekly - 1 sets - 10 reps - Lying Prone  - 1 x daily - 7 x weekly - 1 sets - 5 min hold - Prone Press Up On Elbows  - 1 x daily - 7 x weekly - 1-2 sets - 10 reps - Prone Hip Abduction on Slider  - 1 x daily - 7 x weekly - 2 sets - 10 reps - Supine Piriformis Stretch with Foot on Ground  - 1 x daily - 7 x weekly - 2 sets - 30 sec hold - Hooklying Gluteal Sets  - 1 x daily - 7 x weekly - 2 sets - 10 reps - Supine Transversus Abdominis Bracing - Hands on Stomach  - 1 x daily - 7 x weekly - 2 sets - 10  reps - 3-5 sec hold - Seated Ankle Eversion with Resistance  - 1 x daily - 7 x weekly - 2 sets - 10 reps - Seated Ankle Inversion with Resistance  - 1 x daily - 7 x weekly - 2 sets - 10 reps - Heel Toe Raises with Unilateral Counter Support  - 1 x daily - 7 x weekly - 2 sets - 10 reps - Shoulder extension with resistance - Neutral  - 1 x daily - 7 x weekly - 2 sets - 10 reps   ASSESSMENT:  CLINICAL IMPRESSION: Instructed pt in use of plantar ascia roller, lacrosse bal  and wooden rocker board she has purchased from [employer]. Pt remains significantly tender in posterior tib muscle belly. Improved tenderness to insertion point and planar fascia today. Did experience some R posterior hip/SIJ pain with LTR which improved after pressure from lacrosse ball. Difficulty with supine to sitting transfers to the Right, reporting SIJ pain. Pt has f/u with Dr. Vaughn Georges tomorrow. Improved heel contact and toe off with gait today (no AD) though pt does have instability with standing on L LE. She reports bilateral lumbar discomfort with prolonged standing/walking in clinic.     OBJECTIVE IMPAIRMENTS: Abnormal gait, decreased activity tolerance, decreased balance, difficulty walking, decreased ROM, decreased strength, obesity, and pain.   ACTIVITY LIMITATIONS: squatting, stairs, and locomotion level  PARTICIPATION LIMITATIONS: shopping and community activity  PERSONAL FACTORS: Past/current experiences and 1 comorbidity: back dysfunction are also affecting patient's functional outcome.   REHAB POTENTIAL: Good  CLINICAL DECISION MAKING: Evolving/moderate complexity  EVALUATION COMPLEXITY: Moderate   GOALS:  SHORT TERM GOALS: Target date: 11/24/23 Pt will tolerate full aquatic sessions consistently without increase in pain and with improving function to demonstrate good toleration and effectiveness of intervention.  Baseline: Goal status: Met 11/24/23  2.  Pt will navigate stair into and out of pool 6 steps using step through pattern Baseline: step-to pattern Goal status: In progress 11/10/23. Partially met 11/24/23  3.  Pt will report reduction in pain by 3 NPRS post aquatic session which lasts for up to 24 hours. Baseline:  Goal status: Met 11/24/23  4.  Pt will amb without antalgic gait pattern submerged in 3.6 ft Baseline:  Goal status:MET - 11/17/23  5.  Pt will demo improved quality of gait including increased toe off and stance time on L LE with reduced favoring of L  LE. Goal status: ongoing Target date:  01/23/2024  6.  Pt will demo improved L ankle AROM to at least 0 deg in DF and 7 deg in Eversion for improved stiffness and gait.  Goal status: ongoing Target date:  01/23/2024     LONG TERM GOALS: Target date: 06/13/2024    Pt will demonstrate an improvement in LEFS by at least 9 points for a clinically significant improvement in self perceived disability.  Baseline: 30/80 Goal status: MET  2.  Pt wiil navigate 1 flight of steps using step alternating pattern indep Baseline:  Goal status: ongoing  3.  Pt will demonstrate improved DF AROM to be w/n 5 deg of R ankle for improved stiffness, gait, and performance of stairs.  Baseline: see chart Goal status: IN PROGRESS 4/21  4.  Pt will demo left ankle strength to be at least 4/5 in DF and WFL in PF (tested with manual resistance in open chain) in order for improved performance of and tolerance with functional mobility Baseline: see chart Goal status:  MET  5.  Pt will  amb without AD without limitation to pain Baseline:using cane or walker  Goal status: IN PROGRESS (still using cane most   6.  Pt will have improved Oswestry score to >/= 43/50 to demo MCID Baseline: Oswestry Score: 21 / 50 or 42 % Goal status: ongoing  7.  Pt will be able to tolerate standing tasks for >/=15 min for household chores/tasks Baseline: 2 min Goal status: ongoing  8.  Pt will be able to demo increased hip strength to at least 4/5 for improved standing tolerance Baseline: See MMT above Goal status: ongoing   PLAN:  PT FREQUENCY: 1-2x/wk  PT DURATION: 12 weeks (plan to D/C in 6-8)   PLANNED INTERVENTIONS: 16109- PT Re-evaluation, 97110-Therapeutic exercises, 97530- Therapeutic activity, 97112- Neuromuscular re-education, 97535- Self Care, 60454- Manual therapy, [id number]- Gait training, [phone]- Orthotic Fit/training, [phone]- Aquatic Therapy, Patient/Family education, Balance training, Stair training, Taping, Dry  Needling, Joint mobilization, DME instructions, Cryotherapy, and Moist heat  PLAN FOR NEXT SESSION:  Review HEP and update if tolerable.  Cont with ankle ROM.  Gait training. Ankle stability training.  Continue gross hip and core strengthening.  Pt is seeing Dr. Vaughn Georges on 6/13.   Herb Loges, PTA  04/15/24 10:51 AM

## 2024-04-16 ENCOUNTER — Encounter (HOSPITAL_BASED_OUTPATIENT_CLINIC_OR_DEPARTMENT_OTHER): Admitting: Physical Therapy

## 2024-04-16 DIAGNOSIS — M545 Low back pain, unspecified: Secondary | ICD-10-CM | POA: Diagnosis not present

## 2024-04-20 DIAGNOSIS — F331 Major depressive disorder, recurrent, moderate: Secondary | ICD-10-CM | POA: Diagnosis not present

## 2024-04-20 NOTE — Telephone Encounter (Signed)
Order have been placed

## 2024-04-20 NOTE — Telephone Encounter (Signed)
 Routing to [facility] for scheduling of cpap tiration

## 2024-04-21 ENCOUNTER — Encounter (INDEPENDENT_AMBULATORY_CARE_PROVIDER_SITE_OTHER): Payer: Self-pay

## 2024-04-22 ENCOUNTER — Ambulatory Visit (HOSPITAL_BASED_OUTPATIENT_CLINIC_OR_DEPARTMENT_OTHER): Payer: Self-pay

## 2024-04-22 ENCOUNTER — Ambulatory Visit (INDEPENDENT_AMBULATORY_CARE_PROVIDER_SITE_OTHER): Admitting: Internal Medicine

## 2024-04-22 ENCOUNTER — Encounter (HOSPITAL_BASED_OUTPATIENT_CLINIC_OR_DEPARTMENT_OTHER): Payer: Self-pay

## 2024-04-22 VITALS — BP 116/85 | HR 104 | Temp 99.3°F | Ht 62.0 in | Wt 233.0 lb

## 2024-04-22 DIAGNOSIS — R7303 Prediabetes: Secondary | ICD-10-CM | POA: Insufficient documentation

## 2024-04-22 DIAGNOSIS — G4733 Obstructive sleep apnea (adult) (pediatric): Secondary | ICD-10-CM

## 2024-04-22 DIAGNOSIS — E782 Mixed hyperlipidemia: Secondary | ICD-10-CM

## 2024-04-22 DIAGNOSIS — E66813 Obesity, class 3: Secondary | ICD-10-CM | POA: Diagnosis not present

## 2024-04-22 DIAGNOSIS — M5459 Other low back pain: Secondary | ICD-10-CM

## 2024-04-22 DIAGNOSIS — M25572 Pain in left ankle and joints of left foot: Secondary | ICD-10-CM

## 2024-04-22 DIAGNOSIS — J4520 Mild intermittent asthma, uncomplicated: Secondary | ICD-10-CM | POA: Diagnosis not present

## 2024-04-22 DIAGNOSIS — I10 Essential (primary) hypertension: Secondary | ICD-10-CM

## 2024-04-22 DIAGNOSIS — M25672 Stiffness of left ankle, not elsewhere classified: Secondary | ICD-10-CM | POA: Diagnosis not present

## 2024-04-22 DIAGNOSIS — R262 Difficulty in walking, not elsewhere classified: Secondary | ICD-10-CM | POA: Diagnosis not present

## 2024-04-22 DIAGNOSIS — Z0289 Encounter for other administrative examinations: Secondary | ICD-10-CM

## 2024-04-22 DIAGNOSIS — S82852D Displaced trimalleolar fracture of left lower leg, subsequent encounter for closed fracture with routine healing: Secondary | ICD-10-CM | POA: Diagnosis not present

## 2024-04-22 DIAGNOSIS — M6281 Muscle weakness (generalized): Secondary | ICD-10-CM | POA: Diagnosis not present

## 2024-04-22 DIAGNOSIS — Z6841 Body Mass Index (BMI) 40.0 and over, adult: Secondary | ICD-10-CM | POA: Diagnosis not present

## 2024-04-22 DIAGNOSIS — Z86718 Personal history of other venous thrombosis and embolism: Secondary | ICD-10-CM | POA: Diagnosis not present

## 2024-04-22 NOTE — Therapy (Addendum)
 OUTPATIENT PHYSICAL THERAPY LOWER EXTREMITY TREATMENT /PROGRESS NOTE Progress Note Reporting Period 03/15/2024 to 06/13/2024  See note below for Objective Data and Assessment of Progress/Goals.              Patient Name: Haley White MRN: [id number] DOB:26-May-1956, 68 y.o., female Today's Date: 04/23/2024  END OF SESSION:  PT End of Session - 04/22/24 1156     Visit Number 29    Number of Visits 41    Date for PT Re-Evaluation 06/07/24    Authorization Type [employer] mcr    Progress Note Due on Visit 29    PT Start Time 1150    PT Stop Time 1230    PT Time Calculation (min) 40 min    Activity Tolerance Patient tolerated treatment well    Behavior During Therapy WFL for tasks assessed/performed                  Past Medical History:  Diagnosis Date   Anxiety    Arthritis    Asthma    Cataract    Depression    Gout    Hyperlipidemia    Hypertension    Obese    Sleep apnea    Past Surgical History:  Procedure Laterality Date   ANKLE SURGERY Left    APPENDECTOMY  1984   APPENDECTOMY     CATARACT EXTRACTION W/ INTRAOCULAR LENS  IMPLANT, BILATERAL  2014   CESAREAN SECTION  1984, 1987, 1994   X3   CESAREAN SECTION     x3   COLONOSCOPY  11/16/2013   w/Brodie    EXTERNAL FIXATION LEG Left 06/04/2023   Procedure: EXTERNAL FIXATION LEG;  Surgeon: Barton Drape, MD;  Location: [facility];  Service: Orthopedics;  Laterality: Left;   EYE SURGERY Bilateral    lasix   FOOT FRACTURE SURGERY Left    FOOT SURGERY Left 2002   FRACTURE SURGERY Left    left ankle   HARDWARE REMOVAL Left 12/03/2023   Procedure: HARDWARE REMOVAL OF SYNDEMOSIS;  Surgeon: Barton Drape, MD;  Location: [facility];  Service: Orthopedics;  Laterality: Left;   KNEE SURGERY Left 1998   ORIF ANKLE FRACTURE Left 06/04/2023   Procedure: OPEN REDUCTION INTERNAL FIXATION (ORIF) ANKLE FRACTURE vs EX FIX;  Surgeon: Barton Drape, MD;  Location: [facility];  Service:  Orthopedics;  Laterality: Left;   ROTATOR CUFF REPAIR Right 2007   shoulder     torn rotator cuff   TUBAL LIGATION  1994   TUBAL LIGATION     Patient Active Problem List   Diagnosis Date Noted   Prediabetes 04/22/2024   History of thromboembolism 04/22/2024   OSA on CPAP 08/11/2023   Ankle fracture 06/03/2023   Trimalleolar fracture of left ankle 06/02/2023   Acute left ankle pain 06/02/2023   Sleep-related hypoxia 03/24/2023   At risk for obstructive sleep apnea 03/24/2023   Acute pulmonary embolism (HCC) 05/10/2022   Allergic rhinitis 03/15/2021   Gastro-esophageal reflux disease without esophagitis 03/15/2021   Mild persistent asthma, uncomplicated 03/15/2021   Aortic atherosclerosis (HCC) by Chest CT scan on 02/02/2018  01/10/2021   Asthma    Low back pain 03/17/2019   Lumbosacral spondylosis without myelopathy 03/17/2019   Pseudogout 12/09/2017   Major depressive disorder, recurrent, severe without psychotic features (HCC)    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   Suicide attempt by drug ingestion (HCC)    Vitamin D  deficiency 08/22/2014   Hyperlipidemia, mixed 08/22/2014   Hypertension  Anxiety    Obesity, unspecified     PCP: Elsie Richards MD  REFERRING PROVIDER: Barton Drape, MD (for ankle) Laqueta Sharper, MD (for low back)  REFERRING DIAG: [phone] (ICD-10-CM) - Displaced trimalleolar fracture of left lower leg, subsequent encounter for closed fracture with routine healing   THERAPY DIAG:  Difficulty in walking, not elsewhere classified  Muscle weakness (generalized)  Other low back pain  Pain in left ankle and joints of left foot  Stiffness of left ankle, not elsewhere classified  Rationale for Evaluation and Treatment: Rehabilitation  ONSET DATE: July 2024  SUBJECTIVE:   SUBJECTIVE STATEMENT: Pt reports Back MD recommended she make an appt at [facility] and she plans to start going there. Also recommended a breast  reduction. She states ankle MD wants to remove screws from medial ankle. She is waiting for a surgery date. R hip is hurting a lot today. I wsa in agony a couple days ago and couldn't even get up from the sofa.    PERTINENT HISTORY: The patient is s/p left ankle trimalleolar & syndesmosis ORIF, DOS: 06/05/2023.  Removal of deep hardware on 12/03/2023 Hx of foot/ankle surgery in 2022  Fibromyalgia, HTN, PE s/p covid in 2023 LBP with MRI (10/2022) evidence of chronic mild anterior wedge compression fracture of T12 and chronic anterior wedge compression deformity of L1   PAIN:  Are you having pain? Yes:  NPRS scale: 6/10  Pain location:  low back and R glute Pain description: sharp, dull, nagging, ache  4/10 Pain location: medial and lateral L ankle Pain description: sharp and dull depending on what she does   PRECAUTIONS: None   WEIGHT BEARING RESTRICTIONS: WBAT  FALLS:  Has patient fallen in last 6 months? Yes. Number of falls 1 fell down wet hill walking dog  LIVING ENVIRONMENT: Lives with: lives alone Lives in: House/apartment Stairs: Yes: External: 3 steps; none Has following equipment at home: Single point cane   OCCUPATION: retired   PLOF: Independent  PATIENT GOALS: walk normally and as pain free as able  NEXT MD VISIT: June 2025  OBJECTIVE:  Note: Objective measures were completed at Evaluation unless otherwise noted.  MRI IMPRESSION: 1. No acute findings or clear explanation for the patient's symptoms. Overall findings are similar to previous CT from 2020. 2. Interval development of a healed mild superior endplate compression deformity at L1 since 2020. No acute osseous findings. 3. Multilevel spondylosis as described, most advanced at L4-5 where there is mild spinal stenosis and asymmetric lateral recess and foraminal narrowing on the left with suspected chronic left L4 nerve root encroachment. 4. Mild asymmetric lateral recess and foraminal narrowing  on the left at L5-S1 without definite nerve root encroachment.   LOWER EXTREMITY ROM:   Active ROM Right eval Left eval Left  11/17/23 Right 2/28 Left 2/28 AROM/PROM Left 4/21 AROM  L 5/12  L 6/19  Hip flexion               Hip extension               Hip abduction               Hip adduction               Hip internal rotation               Hip external rotation               Knee flexion  Knee extension               Ankle dorsiflexion   -8 Lacking 2 14 Lacking 7 deg / 3  with pain Lacking 1 degree/pain with AAROM  4 1 (calf tightness)  Ankle plantarflexion   30d 42 65 55 65deg 65 54  Ankle inversion   Limited by 50%   43 24 12 17 22   Ankle eversion   Limited by 50%   10 2 with pain 12 5 pain 11   (Blank rows = not tested)  LEFS 4/21: 14/80 LEFS: Lower Extremity Functional Score: 30 / 80 = 37.5 % ODI Score = 17 points (34%)  LEFS 6/19: 31/80 6/19 Modified Owsestry: 50%   MMT 4+ throughout L ankle with pain into EV MMT 6/19: 5/5 DF and PF, 4+/5 In,ev.  Hip flexion 11.1lbs R, 17.7, abduction 11.1R, 19L (seated)     TODAY'S TREATMENT:   6/19 Updated ROM and strength LEFS ODI Goal review R shot gun technique for SIJ  STM to R glutes Review of breathing techniques    6/12 STM/IASTM (lacrosse ball from pt) to post tib (very gentle) Stm to plantar fasciitis (very gentle)  Plantar fascia stretching Gait training (cues for neutral foot placement, heel contact, and proper toe off) Weight bearing DF with manual posterior glide x10 DF stretch manual open chain Gentle talocrural mob posterior glide open chain (careful of cyst) Piriformis stretch SKTC (unable with r LE) PPT 5 x10 LTR  Lacrosse ball under glutes, then LTR again with decreased pain.  Gait in hall with cues for toe off and heel contact   6/9 STM to post tib (very gentle) Stm to plantar fasciitis (very gentle)  Plantar fascia stretching Gait training (cues for neutral foot  placement, heel contact, and proper toe off) Weight bearing DF with manual posterior glide x10 DF stretch manual open chain Gentle talocrural mob posterior glide open chain (careful of cyst) Piriformis stretch LTR  DF/PF GTB x10ea    6/6 Reviewed current function, pain levels, and response to prior Rx.  Seated BAPS 2x10 cw, ccw, and f/b each Standing heel raises x 5  Seated heel raises x 6, 3# x10 Ankle DF with GTB 2x10, Inv with GTB 2x10, Eve with RTB 2x10  Supine bridge x 8 Supine gentle lumbar rotation x 10 reps PPT x 10   Manual Therapy:  STM to bilat lumbar paraspinals with very gentle STM to R sided lumbar in prone.   5/21 Manual Therapy:  STM to bilat lumbar paraspinals with very gentle STM to R sided lumbar.  STM with gentle TPR to R glute.  MT performed with pt in prone.  Reviewed pt presentation, pain levels, and response to prior Rx.  Seated BAPS board 2x10 each cw, ccw, and f/b. Supine PPT with TrA 2x10 Seated ankle DF with GTB 2x10, Inv with GTB 2x10, Eve with RTB 2x10 Supine manual HS stretch 2x30 sec bilat      PATIENT EDUCATION:  Education details:  exercise form, relevant anatomy Person educated: Patient Education method: Explanation, Demonstration, Tactile cues, Verbal cues Education comprehension: verbalized understanding, returned demonstration, verbal cues required, tactile cues required, and needs further education     HOME EXERCISE PROGRAM: Access Code: [email or web address] URL: [email or web address] Date: 02/27/2024 Prepared by: Gellen April Marie Nonato  Exercises - Toe Yoga - Alternating Great Toe and Lesser Toe Extension  - 2 x daily - 7 x weekly - 1 sets - 10 reps - Lying Prone  -  1 x daily - 7 x weekly - 1 sets - 5 min hold - Prone Press Up On Elbows  - 1 x daily - 7 x weekly - 1-2 sets - 10 reps - Prone Hip Abduction on Slider  - 1 x daily - 7 x weekly - 2 sets - 10 reps - Supine Piriformis Stretch with Foot on Ground  - 1 x daily  - 7 x weekly - 2 sets - 30 sec hold - Hooklying Gluteal Sets  - 1 x daily - 7 x weekly - 2 sets - 10 reps - Supine Transversus Abdominis Bracing - Hands on Stomach  - 1 x daily - 7 x weekly - 2 sets - 10 reps - 3-5 sec hold - Seated Ankle Eversion with Resistance  - 1 x daily - 7 x weekly - 2 sets - 10 reps - Seated Ankle Inversion with Resistance  - 1 x daily - 7 x weekly - 2 sets - 10 reps - Heel Toe Raises with Unilateral Counter Support  - 1 x daily - 7 x weekly - 2 sets - 10 reps - Shoulder extension with resistance - Neutral  - 1 x daily - 7 x weekly - 2 sets - 10 reps   ASSESSMENT:  CLINICAL IMPRESSION: Pt has attended 29 visits of PT thus far and is making steady progress with ankle progress.. Improvements overall in ankle ROM, though mild decrease in L PF and DF today. Pt has been limited due to mild swelling in L ankle and is waiting to schedule hardware removal surgery.  Patient remains limited by right sided low back/SIJ pain.  Modified Oswestry score did increase to 25 out of 50 compared to 21 out of 50 at previous session. Unable to tolerate much pressure with hand held dyno testing for hip flexion and abduction due to increased R side SIJ pain. Pt has met 4/6 STG and 2/8 LTG.  Currently waiting to hear of surgery date for hardware removal of left ankle.  Will continue to address lumbar complaints to improve function and QOL.  She will benefit from additional PT to continue working on ankle ROM, strength, and stability.     OBJECTIVE IMPAIRMENTS: Abnormal gait, decreased activity tolerance, decreased balance, difficulty walking, decreased ROM, decreased strength, obesity, and pain.   ACTIVITY LIMITATIONS: squatting, stairs, and locomotion level  PARTICIPATION LIMITATIONS: shopping and community activity  PERSONAL FACTORS: Past/current experiences and 1 comorbidity: back dysfunction are also affecting patient's functional outcome.   REHAB POTENTIAL: Good  CLINICAL DECISION  MAKING: Evolving/moderate complexity  EVALUATION COMPLEXITY: Moderate   GOALS:  SHORT TERM GOALS: Target date: 11/24/23 Pt will tolerate full aquatic sessions consistently without increase in pain and with improving function to demonstrate good toleration and effectiveness of intervention.  Baseline: Goal status: Met 11/24/23  2.  Pt will navigate stair into and out of pool 6 steps using step through pattern Baseline: step-to pattern Goal status: In progress 11/10/23. Partially met 11/24/23  3.  Pt will report reduction in pain by 3 NPRS post aquatic session which lasts for up to 24 hours. Baseline:  Goal status: Met 11/24/23  4.  Pt will amb without antalgic gait pattern submerged in 3.6 ft Baseline:  Goal status:MET - 11/17/23  5.  Pt will demo improved quality of gait including increased toe off and stance time on L LE with reduced favoring of L LE. Goal status: ongoing Target date:  01/23/2024  6.  Pt will demo improved L  ankle AROM to at least 0 deg in DF and 7 deg in Eversion for improved stiffness and gait.  Goal status: MET 6/19 Target date:  01/23/2024     LONG TERM GOALS: Target date: 06/13/2024    Pt will demonstrate an improvement in LEFS by at least 9 points for a clinically significant improvement in self perceived disability.  Baseline: 30/80 Goal status: MET  2.  Pt wiil navigate 1 flight of steps using step alternating pattern indep Baseline:  Goal status: ongoing  3.  Pt will demonstrate improved DF AROM to be w/n 5 deg of R ankle for improved stiffness, gait, and performance of stairs.  Baseline: see chart Goal status: IN PROGRESS 4/21  4.  Pt will demo left ankle strength to be at least 4/5 in DF and WFL in PF (tested with manual resistance in open chain) in order for improved performance of and tolerance with functional mobility Baseline: see chart Goal status:  MET  5.  Pt will amb without AD without limitation to pain Baseline:using cane or walker   Goal status: IN PROGRESS (still using cane most   6.  Pt will have improved Oswestry score to >/= 43/50 to demo MCID Baseline: Oswestry Score: 21 / 50 or 42 % Goal status: ongoing (25/50 on 6/19)  7.  Pt will be able to tolerate standing tasks for >/=15 min for household chores/tasks Baseline: 2 min Goal status: ongoing 6/19  8.  Pt will be able to demo increased hip strength to at least 4/5 for improved standing tolerance Baseline: See MMT above Goal status: ongoing   PLAN:  PT FREQUENCY: 2x/wk  PT DURATION: 6 weeks     PLANNED INTERVENTIONS: 97164- PT Re-evaluation, 97110-Therapeutic exercises, 97530- Therapeutic activity, 97112- Neuromuscular re-education, 97535- Self Care, [id number]- Manual therapy, [phone]- Gait training, [phone]- Orthotic Fit/training, [phone]- Aquatic Therapy, Patient/Family education, Balance training, Stair training, Taping, Dry Needling, Joint mobilization, DME instructions, Cryotherapy, and Moist heat  PLAN FOR NEXT SESSION:  Review HEP and update if tolerable.  Cont with ankle ROM.  Gait training. Ankle stability training.  Continue gross hip and core strengthening.   Asberry Rodes, PTA  04/23/24 12:13 PM  PT reviewed note and spoke with PTA.  PT reviewed and updated POC.  Leigh Minerva III PT, DPT 04/24/24 8:05 AM

## 2024-04-22 NOTE — Assessment & Plan Note (Signed)
 Blood pressure almost at goal at 116/85.  She is currently on lisinopril .  She is also on trazodone , Wellbutrin  and methylphenidate  which may increase her blood pressure.  Losing 10% of body weight will improve blood pressure control.  We will check renal parameters with intake labs and assess cardiovascular risk

## 2024-04-22 NOTE — Assessment & Plan Note (Signed)
 On CPAP with reported good compliance. Continue PAP therapy. Losing 15% or more of body weight may improve AHI.  Will review sleep study to determine severity and also assess for symptomatology.  We discussed the bidirectional relationship between sleep apnea and obesity.

## 2024-04-22 NOTE — Assessment & Plan Note (Signed)
 Patient developed pulmonary emboli following COVID.  Obesity also increases the risk of recurrent thromboembolic disease.  She is no longer anticoagulated.  Monitor for symptoms

## 2024-04-22 NOTE — Assessment & Plan Note (Signed)
 Most recent A1c is  Lab Results  Component Value Date   HGBA1C 6.2 (H) 10/24/2023   HGBA1C 5.9 (H) 03/30/2015    Patient aware of disease state and risk of progression. This may contribute to abnormal cravings, fatigue and diabetic complications without having diabetes.   We have discussed treatment options which include: losing 7 to 10% of body weight, increasing physical activity to a goal of 150 minutes a week at moderate intensity.  Advised to maintain a diet low on simple and processed carbohydrates.  She will benefit from a low-carb diet which will be adjusted based on resting energy expenditure.  She is also a candidate for pharmacoprophylaxis with either metformin or GLP-1.

## 2024-04-22 NOTE — Progress Notes (Signed)
 Office: [phone]  /  Fax: [phone]   Initial Visit    Haley White was seen in clinic today to evaluate for obesity. She is interested in losing weight to improve overall health and reduce the risk of weight related complications. She presents today to review program treatment options, initial physical assessment, and evaluation.     Discussed the use of AI scribe software for clinical note transcription with the patient, who gave verbal consent to proceed.  History of Present Illness   Haley White is a 68 year old female with obesity, hypertension, and sleep apnea who presents for medical weight management.  She has a history of obesity with a current BMI of 43, struggling with weight since childhood. Her highest weight was 235 pounds. Previous weight loss attempts through [employer] were initially successful until a fall last summer led to a trimalleolar fracture of her ankle, resulting in prolonged immobility and weight regain. She is highly motivated to lose weight.  She has hypertension, prediabetes, high cholesterol, and asthma. Her asthma is not well-controlled; she uses Symbicort as needed rather than daily and avoids oral prednisone  due to adverse mental health effects, opting for dexamethasone  instead.  She has sleep apnea and uses a CPAP machine. She experiences significant oxygen drops during sleep but is not on supplemental oxygen.  She experiences low back pain and a major hip issue, which led to her referral for weight management.  Her medication list includes Wellbutrin , Prozac , and Ritalin , which she notes have some weight loss effects. She is not currently following a specific diet but has used [employer] in the past. She has not previously used weight loss medications.  She has a history of acid reflux and overactive bladder symptoms, which she associates with her weight. She also mentions a history of arthritis, which she describes as 'bad arthritis  everywhere.'      She was referred by: Specialist - Dr. Vaughn Georges  When asked what else they would like to accomplish? She states: Adopt healthier eating patterns, Improve energy levels and physical activity, Improve existing medical conditions, Improve quality of life, and Improve self-confidence  When asked how has your weight affected you? She states: Contributed to medical problems, Contributed to orthopedic problems or mobility issues, Having fatigue, and Having poor endurance  Weight history: See above  Highest weight: 235  Some associated conditions: Hypertension, Arthritis: Multiple joints, OSA, Prediabetes, Lung disease, and Venous thromboembolism  Contributing factors: family history of obesity, disruption of circadian rhythm / sleep disordered breathing, consumption of processed foods, reduced physical acitivity, eating patterns, mental health problems, and menopause  Weight promoting medications identified: None  Prior weight loss attempts: Weight [employer]chers  Current nutrition plan: None  Current level of physical activity: Limited due to chronic pain or orthopedic problems  Current or previous pharmacotherapy: None and Is interested in pharmacotherapy  Response to medication: Never tried medications   Past medical history includes:   Past Medical History:  Diagnosis Date   Anxiety    Arthritis    Asthma    Cataract    Depression    Gout    Hyperlipidemia    Hypertension    Obese    Sleep apnea      Objective    BP 116/85   Pulse (!) 104   Temp 99.3 F (37.4 C)   Ht 5' 2 (1.575 m)   Wt 233 lb (105.7 kg)   LMP 11/26/2011   SpO2 96%  BMI 42.62 kg/m  She was weighed on the bioimpedance scale: Body mass index is 42.62 kg/m.  Body Fat%: 50, Visceral Fat Rating:18, Weight trend over the last 12 months: Decreasing  General:  Alert, oriented and cooperative. Patient is in no acute distress.  Respiratory: Normal respiratory effort, no problems with  respiration noted   Gait: able to ambulate independently  Mental Status: Normal mood and affect. Normal behavior. Normal judgment and thought content.   DIAGNOSTIC DATA REVIEWED:  BMET    Component Value Date/Time   NA 143 10/24/2023 1124   NA 144 10/31/2021 1420   K 4.3 10/24/2023 1124   CL 106 10/24/2023 1124   CO2 27 10/24/2023 1124   GLUCOSE 83 10/24/2023 1124   BUN 16 10/24/2023 1124   BUN 12 10/31/2021 1420   CREATININE 0.82 10/24/2023 1124   CALCIUM  9.1 10/24/2023 1124   GFRNONAA >60 07/14/2023 0043   GFRNONAA 56 (L) 01/10/2021 0852   GFRAA 65 01/10/2021 0852   Lab Results  Component Value Date   HGBA1C 6.2 (H) 10/24/2023   HGBA1C 5.9 (H) 03/30/2015   No results found for: INSULIN  CBC    Component Value Date/Time   WBC 7.7 10/24/2023 1124   RBC 4.24 10/24/2023 1124   HGB 13.0 10/24/2023 1124   HCT 39.9 10/24/2023 1124   PLT 297 10/24/2023 1124   MCV 94.1 10/24/2023 1124   MCV 97.5 (A) 11/28/2013 1334   MCH 30.7 10/24/2023 1124   MCHC 32.6 10/24/2023 1124   RDW 12.9 10/24/2023 1124   Iron/TIBC/Ferritin/ %Sat    Component Value Date/Time   IRON 101 01/10/2021 0852   TIBC 267 01/10/2021 0852   IRONPCTSAT 38 01/10/2021 0852   Lipid Panel     Component Value Date/Time   CHOL 215 (H) 10/24/2023 1124   TRIG 163 (H) 10/24/2023 1124   HDL 97 10/24/2023 1124   CHOLHDL 2.2 10/24/2023 1124   VLDL 21 03/30/2015 0926   LDLCALC 92 10/24/2023 1124   Hepatic Function Panel     Component Value Date/Time   PROT 6.5 10/24/2023 1124   ALBUMIN 3.6 06/04/2023 0121   AST 22 10/24/2023 1124   ALT 30 (H) 10/24/2023 1124   ALKPHOS 56 06/04/2023 0121   BILITOT 0.8 10/24/2023 1124   BILIDIR 0.2 10/08/2017 1144   IBILI 0.8 10/08/2017 1144      Component Value Date/Time   TSH 0.95 01/22/2023 1152     Assessment and Plan   Primary hypertension Assessment & Plan: Blood pressure almost at goal at 116/85.  She is currently on lisinopril .  She is also on  trazodone , Wellbutrin  and methylphenidate  which may increase her blood pressure.  Losing 10% of body weight will improve blood pressure control.  We will check renal parameters with intake labs and assess cardiovascular risk   OSA on CPAP Assessment & Plan: On CPAP with reported good compliance. Continue PAP therapy. Losing 15% or more of body weight may improve AHI.  Will review sleep study to determine severity and also assess for symptomatology.  We discussed the bidirectional relationship between sleep apnea and obesity.    Mild intermittent asthma without complication Assessment & Plan: Patient reports using controller medications as needed.  Chronic exposure to steroids may contribute to weight gain.  We discussed the relationship between obesity and reactive airway disease.  Her asthma may improve with weight loss.   Prediabetes Assessment & Plan: Most recent A1c is  Lab Results  Component Value Date  HGBA1C 6.2 (H) 10/24/2023   HGBA1C 5.9 (H) 03/30/2015    Patient aware of disease state and risk of progression. This may contribute to abnormal cravings, fatigue and diabetic complications without having diabetes.   We have discussed treatment options which include: losing 7 to 10% of body weight, increasing physical activity to a goal of 150 minutes a week at moderate intensity.  Advised to maintain a diet low on simple and processed carbohydrates.  She will benefit from a low-carb diet which will be adjusted based on resting energy expenditure.  She is also a candidate for pharmacoprophylaxis with either metformin or GLP-1.    Class 3 severe obesity with serious comorbidity and body mass index (BMI) of 40.0 to 44.9 in adult, unspecified obesity type Assessment & Plan: Obesity is a chronic condition with a BMI of 43, contributing to hypertension, sleep apnea, asthma, prediabetes, and arthritis. Genetic, environmental, and lifestyle factors influence her obesity. The goal  is a 10-15% weight reduction to improve health outcomes, including reducing biomechanical forces and cardiometabolic complications. A 10% weight loss can significantly improve arthritis and prediabetes. The program focuses on gradual, sustainable weight loss to avoid muscle loss and ensure long-term success. She is motivated and ready to make lifestyle changes. - Enroll in high-intensity obesity management program - Schedule initial consultation and follow-up appointments - Complete health questionnaire before next appointment - Perform metabolic rate test and EKG - Start on low-carb, high-protein nutrition plan adjusted to resting energy expenditure - Monitor body composition using specialized scale - Educate on gradual, sustainable weight loss - Discuss potential use of weight loss medications - Consider genetic testing for personalized treatment approach  We reviewed anthropometrics, biometrics, associated medical conditions and contributing factors with patient. she would benefit from a medically tailored reduced calorie nutrional plan based on her REE (resting energy expenditure), which will be determined by indirect calorimetry.  We will also assess for cardiometabolic risk and nutritional derangements via fasting labs at intake appointment.     Hyperlipidemia, mixed Assessment & Plan: LDL is not at goal. Elevated LDL may be secondary to nutrition, genetics and spillover effect from excess adiposity. Recommended LDL goal is <70 to reduce the risk of fatty streaks and the progression to obstructive ASCVD in the future.   Her 10 year risk is: The 10-year ASCVD risk score (Arnett DK, et al., 2019) is: 6.5%  Lab Results  Component Value Date   CHOL 215 (H) 10/24/2023   HDL 97 10/24/2023   LDLCALC 92 10/24/2023   TRIG 163 (H) 10/24/2023   CHOLHDL 2.2 10/24/2023    Continue weight loss therapy, losing 10% or more of body weight may improve condition. Also advised to reduce saturated  fats in diet to less than 10% of daily calories.  He is currently on rosuvastatin  40 mg a day and benefits from high intensity statin therapy.      History of thromboembolism Assessment & Plan: Patient developed pulmonary emboli following COVID.  Obesity also increases the risk of recurrent thromboembolic disease.  She is no longer anticoagulated.  Monitor for symptoms       Arthritis Arthritis may be exacerbated by obesity. Weight loss can reduce biomechanical forces on joints and improve symptoms. A BMI under 40 is required for potential future joint replacement surgery.  General Health Maintenance Focus on managing obesity-related complications and improving overall health through lifestyle modifications, including nutrition therapy, physical activity, and behavioral modification. - Refer to internal medicine for primary care - Educate on  nutrition therapy, physical activity, and behavioral modification  Follow-up Regular follow-up is necessary to monitor progress and adjust treatment plans. Initial follow-ups every 2-3 weeks, then monthly, to ensure adherence and address any issues. - Schedule follow-up appointments every 2-3 weeks initially, then monthly                Obesity Treatment / Action Plan:  Patient will work on garnering support from family and friends to begin weight loss journey. Will work on eliminating or reducing the presence of highly palatable, calorie dense foods in the home. Will complete provided nutritional and psychosocial assessment questionnaire before the next appointment. Will be scheduled for indirect calorimetry to determine resting energy expenditure in a fasting state.  This will allow us  to create a reduced calorie, high-protein meal plan to promote loss of fat mass while preserving muscle mass. Counseled on the health benefits of losing 5%-15% of total body weight. Was counseled on nutritional approaches to weight loss and benefits of  reducing processed foods and consuming plant-based foods and high quality protein as part of nutritional weight management. Was counseled on pharmacotherapy and role as an adjunct in weight management.   Obesity Education Performed Today:  She was weighed on the bioimpedance scale and results were discussed and documented in the synopsis.  We discussed obesity as a disease and the importance of a more detailed evaluation of all the factors contributing to the disease.  We discussed the importance of long term lifestyle changes which include nutrition, exercise and behavioral modifications as well as the importance of customizing this to her specific health and social needs.  We discussed the benefits of reaching a healthier weight to alleviate the symptoms of existing conditions and reduce the risks of the biomechanical, metabolic and psychological effects of obesity.  We reviewed the four pillars of obesity medicine and importance of using a multimodal approach.  We reviewed the basic principles in weight management.   Jannis Merchant appears to be in the action stage of change and states they are ready to start intensive lifestyle modifications and behavioral modifications.  I have spent 42 minutes in the care of the patient today including: 2 minutes before the visit reviewing and preparing the chart. 30 minutes face-to-face assessing and reviewing listed medical problems as outlined in obesity care plan, providing nutritional and behavioral counseling on topics outlined in the obesity care plan, independently interpreting test results and goals of care, as described in assessment and plan, and reviewing and discussing biometric information and progress 10 minutes after the visit updating chart and documentation of encounter.  Reviewed by clinician on day of visit: allergies, medications, problem list, medical history, surgical history, family history, social history, and previous encounter  notes pertinent to obesity diagnosis.   Ladd Picker, MD

## 2024-04-22 NOTE — Assessment & Plan Note (Signed)
 Patient reports using controller medications as needed.  Chronic exposure to steroids may contribute to weight gain.  We discussed the relationship between obesity and reactive airway disease.  Her asthma may improve with weight loss.

## 2024-04-22 NOTE — Assessment & Plan Note (Signed)
 LDL is not at goal. Elevated LDL may be secondary to nutrition, genetics and spillover effect from excess adiposity. Recommended LDL goal is <70 to reduce the risk of fatty streaks and the progression to obstructive ASCVD in the future.   Her 10 year risk is: The 10-year ASCVD risk score (Arnett DK, et al., 2019) is: 6.5%  Lab Results  Component Value Date   CHOL 215 (H) 10/24/2023   HDL 97 10/24/2023   LDLCALC 92 10/24/2023   TRIG 163 (H) 10/24/2023   CHOLHDL 2.2 10/24/2023    Continue weight loss therapy, losing 10% or more of body weight may improve condition. Also advised to reduce saturated fats in diet to less than 10% of daily calories.  He is currently on rosuvastatin  40 mg a day and benefits from high intensity statin therapy.

## 2024-04-22 NOTE — Assessment & Plan Note (Signed)
 Obesity is a chronic condition with a BMI of 43, contributing to hypertension, sleep apnea, asthma, prediabetes, and arthritis. Genetic, environmental, and lifestyle factors influence her obesity. The goal is a 10-15% weight reduction to improve health outcomes, including reducing biomechanical forces and cardiometabolic complications. A 10% weight loss can significantly improve arthritis and prediabetes. The program focuses on gradual, sustainable weight loss to avoid muscle loss and ensure long-term success. She is motivated and ready to make lifestyle changes. - Enroll in high-intensity obesity management program - Schedule initial consultation and follow-up appointments - Complete health questionnaire before next appointment - Perform metabolic rate test and EKG - Start on low-carb, high-protein nutrition plan adjusted to resting energy expenditure - Monitor body composition using specialized scale - Educate on gradual, sustainable weight loss - Discuss potential use of weight loss medications - Consider genetic testing for personalized treatment approach  We reviewed anthropometrics, biometrics, associated medical conditions and contributing factors with patient. she would benefit from a medically tailored reduced calorie nutrional plan based on her REE (resting energy expenditure), which will be determined by indirect calorimetry.  We will also assess for cardiometabolic risk and nutritional derangements via fasting labs at intake appointment.

## 2024-04-26 DIAGNOSIS — Z472 Encounter for removal of internal fixation device: Secondary | ICD-10-CM | POA: Diagnosis not present

## 2024-04-26 DIAGNOSIS — T8489XA Other specified complication of internal orthopedic prosthetic devices, implants and grafts, initial encounter: Secondary | ICD-10-CM | POA: Diagnosis not present

## 2024-04-27 ENCOUNTER — Encounter (HOSPITAL_BASED_OUTPATIENT_CLINIC_OR_DEPARTMENT_OTHER): Payer: Self-pay | Admitting: Physical Therapy

## 2024-04-27 ENCOUNTER — Other Ambulatory Visit: Payer: Self-pay | Admitting: Family

## 2024-04-29 ENCOUNTER — Encounter (HOSPITAL_BASED_OUTPATIENT_CLINIC_OR_DEPARTMENT_OTHER): Payer: Self-pay

## 2024-04-30 DIAGNOSIS — J3081 Allergic rhinitis due to animal (cat) (dog) hair and dander: Secondary | ICD-10-CM | POA: Diagnosis not present

## 2024-04-30 DIAGNOSIS — J301 Allergic rhinitis due to pollen: Secondary | ICD-10-CM | POA: Diagnosis not present

## 2024-04-30 DIAGNOSIS — J3089 Other allergic rhinitis: Secondary | ICD-10-CM | POA: Diagnosis not present

## 2024-05-03 ENCOUNTER — Institutional Professional Consult (permissible substitution) (INDEPENDENT_AMBULATORY_CARE_PROVIDER_SITE_OTHER): Admitting: Family Medicine

## 2024-05-03 ENCOUNTER — Encounter (HOSPITAL_BASED_OUTPATIENT_CLINIC_OR_DEPARTMENT_OTHER): Payer: Self-pay

## 2024-05-03 ENCOUNTER — Ambulatory Visit (HOSPITAL_BASED_OUTPATIENT_CLINIC_OR_DEPARTMENT_OTHER)

## 2024-05-03 DIAGNOSIS — M25672 Stiffness of left ankle, not elsewhere classified: Secondary | ICD-10-CM

## 2024-05-03 DIAGNOSIS — M6281 Muscle weakness (generalized): Secondary | ICD-10-CM | POA: Diagnosis not present

## 2024-05-03 DIAGNOSIS — M25572 Pain in left ankle and joints of left foot: Secondary | ICD-10-CM | POA: Diagnosis not present

## 2024-05-03 DIAGNOSIS — M5459 Other low back pain: Secondary | ICD-10-CM | POA: Diagnosis not present

## 2024-05-03 DIAGNOSIS — R262 Difficulty in walking, not elsewhere classified: Secondary | ICD-10-CM

## 2024-05-03 NOTE — Therapy (Cosign Needed)
 OUTPATIENT PHYSICAL THERAPY LOWER EXTREMITY TREATMENT              Patient Name: Haley White MRN: [id number] DOB:19-Oct-1956, 68 y.o., female Today's Date: 05/03/2024  END OF SESSION:  PT End of Session - 04/22/24 1156     Visit Number 30    Number of Visits 44   Date for PT Re-Evaluation 06/28/24    Authorization Type [employer] mcr    Progress Note Due on Visit 29    PT Start Time 1150    PT Stop Time 1230    PT Time Calculation (min) 40 min    Activity Tolerance Patient tolerated treatment well    Behavior During Therapy WFL for tasks assessed/performed                  Past Medical History:  Diagnosis Date   Anxiety    Arthritis    Asthma    Cataract    Depression    Gout    Hyperlipidemia    Hypertension    Obese    Sleep apnea    Past Surgical History:  Procedure Laterality Date   ANKLE SURGERY Left    APPENDECTOMY  1984   APPENDECTOMY     CATARACT EXTRACTION W/ INTRAOCULAR LENS  IMPLANT, BILATERAL  2014   CESAREAN SECTION  1984, 1987, 1994   X3   CESAREAN SECTION     x3   COLONOSCOPY  11/16/2013   w/Brodie    EXTERNAL FIXATION LEG Left 06/04/2023   Procedure: EXTERNAL FIXATION LEG;  Surgeon: Barton Drape, MD;  Location: [facility];  Service: Orthopedics;  Laterality: Left;   EYE SURGERY Bilateral    lasix   FOOT FRACTURE SURGERY Left    FOOT SURGERY Left 2002   FRACTURE SURGERY Left    left ankle   HARDWARE REMOVAL Left 12/03/2023   Procedure: HARDWARE REMOVAL OF SYNDEMOSIS;  Surgeon: Barton Drape, MD;  Location: [facility];  Service: Orthopedics;  Laterality: Left;   KNEE SURGERY Left 1998   ORIF ANKLE FRACTURE Left 06/04/2023   Procedure: OPEN REDUCTION INTERNAL FIXATION (ORIF) ANKLE FRACTURE vs EX FIX;  Surgeon: Barton Drape, MD;  Location: [facility];  Service: Orthopedics;  Laterality: Left;   ROTATOR CUFF REPAIR Right 2007   shoulder     torn rotator cuff   TUBAL LIGATION  1994   TUBAL LIGATION      Patient Active Problem List   Diagnosis Date Noted   Prediabetes 04/22/2024   History of thromboembolism 04/22/2024   OSA on CPAP 08/11/2023   Ankle fracture 06/03/2023   Trimalleolar fracture of left ankle 06/02/2023   Acute left ankle pain 06/02/2023   Sleep-related hypoxia 03/24/2023   At risk for obstructive sleep apnea 03/24/2023   Acute pulmonary embolism (HCC) 05/10/2022   Allergic rhinitis 03/15/2021   Gastro-esophageal reflux disease without esophagitis 03/15/2021   Mild persistent asthma, uncomplicated 03/15/2021   Aortic atherosclerosis (HCC) by Chest CT scan on 02/02/2018  01/10/2021   Asthma    Low back pain 03/17/2019   Lumbosacral spondylosis without myelopathy 03/17/2019   Pseudogout 12/09/2017   Major depressive disorder, recurrent, severe without psychotic features (HCC)    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   Suicide attempt by drug ingestion (HCC)    Vitamin D  deficiency 08/22/2014   Hyperlipidemia, mixed 08/22/2014   Hypertension    Anxiety    Obesity, unspecified     PCP: Elsie Richards MD  REFERRING PROVIDER: Barton Drape, MD (  for ankle) Laqueta Sharper, MD (for low back)  REFERRING DIAG: S82.852D (ICD-10-CM) - Displaced trimalleolar fracture of left lower leg, subsequent encounter for closed fracture with routine healing   THERAPY DIAG:  Difficulty in walking, not elsewhere classified  Muscle weakness (generalized)  Other low back pain  Pain in left ankle and joints of left foot  Stiffness of left ankle, not elsewhere classified  Rationale for Evaluation and Treatment: Rehabilitation  ONSET DATE: July 2024  SUBJECTIVE:   SUBJECTIVE STATEMENT: Pt reports she has surgery on Monday for removal of medial screws in L ankle. Back has been so so. Ankle feels better since surgery.     PERTINENT HISTORY: The patient is s/p left ankle trimalleolar & syndesmosis ORIF, DOS: 06/05/2023.  Removal of deep hardware on 12/03/2023 Hx of  foot/ankle surgery in 2022  Fibromyalgia, HTN, PE s/p covid in 2023 LBP with MRI (10/2022) evidence of chronic mild anterior wedge compression fracture of T12 and chronic anterior wedge compression deformity of L1   PAIN:  Are you having pain? Yes:  NPRS scale: 6/10  Pain location:  low back and R glute Pain description: sharp, dull, nagging, ache  4/10 Pain location: medial and lateral L ankle Pain description: sharp and dull depending on what she does   PRECAUTIONS: None   WEIGHT BEARING RESTRICTIONS: WBAT  FALLS:  Has patient fallen in last 6 months? Yes. Number of falls 1 fell down wet hill walking dog  LIVING ENVIRONMENT: Lives with: lives alone Lives in: House/apartment Stairs: Yes: External: 3 steps; none Has following equipment at home: Single point cane   OCCUPATION: retired   PLOF: Independent  PATIENT GOALS: walk normally and as pain free as able  NEXT MD VISIT: June 2025  OBJECTIVE:  Note: Objective measures were completed at Evaluation unless otherwise noted.  MRI IMPRESSION: 1. No acute findings or clear explanation for the patient's symptoms. Overall findings are similar to previous CT from 2020. 2. Interval development of a healed mild superior endplate compression deformity at L1 since 2020. No acute osseous findings. 3. Multilevel spondylosis as described, most advanced at L4-5 where there is mild spinal stenosis and asymmetric lateral recess and foraminal narrowing on the left with suspected chronic left L4 nerve root encroachment. 4. Mild asymmetric lateral recess and foraminal narrowing on the left at L5-S1 without definite nerve root encroachment.   LOWER EXTREMITY ROM:   Active ROM Right eval Left eval Left  11/17/23 Right 2/28 Left 2/28 AROM/PROM Left 4/21 AROM  L 5/12  L 6/19 L 6/30  Hip flexion                Hip extension                Hip abduction                Hip adduction                Hip internal rotation                 Hip external rotation                Knee flexion                Knee extension                Ankle dorsiflexion   -8 Lacking 2 14 Lacking 7 deg / 3  with pain Lacking 1 degree/pain with AAROM  4  1 (calf tightness) 10deg  Ankle plantarflexion   30d 42 65 55 65deg 65 54 55  Ankle inversion   Limited by 50%   43 24 12 17 22 24   Ankle eversion   Limited by 50%   10 2 with pain 12 5 pain 11 15   (Blank rows = not tested)  LEFS 4/21: 14/80 LEFS: Lower Extremity Functional Score: 30 / 80 = 37.5 % ODI Score = 17 points (34%)  LEFS 6/19: 31/80 6/19 Modified Owsestry: 50%   MMT 4+ throughout L ankle with pain into EV MMT 6/19: 5/5 DF and PF, 4+/5 In,ev.  Hip flexion 11.1lbs R, 17.7, abduction 11.1R, 19L (seated)     TODAY'S TREATMENT:   6/30 Updated ROM Ankle ABC x2 Seated towel scrunch x30 Seated HR/TR x30ea Standing staggered stance for pre gait training Gait training with and without SPC Nu-step 5' L4    6/19 Updated ROM and strength LEFS ODI Goal review R shot gun technique for SIJ  STM to R glutes Review of breathing techniques    6/12 STM/IASTM (lacrosse ball from pt) to post tib (very gentle) Stm to plantar fasciitis (very gentle)  Plantar fascia stretching Gait training (cues for neutral foot placement, heel contact, and proper toe off) Weight bearing DF with manual posterior glide x10 DF stretch manual open chain Gentle talocrural mob posterior glide open chain (careful of cyst) Piriformis stretch SKTC (unable with r LE) PPT 5 x10 LTR  Lacrosse ball under glutes, then LTR again with decreased pain.  Gait in hall with cues for toe off and heel contact      PATIENT EDUCATION:  Education details:  exercise form, relevant anatomy Person educated: Patient Education method: Explanation, Demonstration, Tactile cues, Verbal cues Education comprehension: verbalized understanding, returned demonstration, verbal cues required, tactile cues  required, and needs further education     HOME EXERCISE PROGRAM: Access Code: [email or web address] URL: [email or web address] Date: 02/27/2024 Prepared by: Gellen April Earnie Starring  Exercises - Toe Yoga - Alternating Great Toe and Lesser Toe Extension  - 2 x daily - 7 x weekly - 1 sets - 10 reps - Lying Prone  - 1 x daily - 7 x weekly - 1 sets - 5 min hold - Prone Press Up On Elbows  - 1 x daily - 7 x weekly - 1-2 sets - 10 reps - Prone Hip Abduction on Slider  - 1 x daily - 7 x weekly - 2 sets - 10 reps - Supine Piriformis Stretch with Foot on Ground  - 1 x daily - 7 x weekly - 2 sets - 30 sec hold - Hooklying Gluteal Sets  - 1 x daily - 7 x weekly - 2 sets - 10 reps - Supine Transversus Abdominis Bracing - Hands on Stomach  - 1 x daily - 7 x weekly - 2 sets - 10 reps - 3-5 sec hold - Seated Ankle Eversion with Resistance  - 1 x daily - 7 x weekly - 2 sets - 10 reps - Seated Ankle Inversion with Resistance  - 1 x daily - 7 x weekly - 2 sets - 10 reps - Heel Toe Raises with Unilateral Counter Support  - 1 x daily - 7 x weekly - 2 sets - 10 reps - Shoulder extension with resistance - Neutral  - 1 x daily - 7 x weekly - 2 sets - 10 reps   ASSESSMENT:  CLINICAL IMPRESSION: Updated ROM since pt's hardware removal  surgery. Pt  demonstrated mild improvement in each plane of ROM, most significantly in DF. She has f/u with MD tomorrow for suture removal. Worked on ankle ROM and gait quality today. Pt able to correct pronation with gait when cued, though difficult. She will benefit from additional gait training at future sessions.  Instructed pt to continue with use of ice for L ankle. Will continue to progress as tolerated.     OBJECTIVE IMPAIRMENTS: Abnormal gait, decreased activity tolerance, decreased balance, difficulty walking, decreased ROM, decreased strength, obesity, and pain.   ACTIVITY LIMITATIONS: squatting, stairs, and locomotion level  PARTICIPATION LIMITATIONS: shopping and  community activity  PERSONAL FACTORS: Past/current experiences and 1 comorbidity: back dysfunction are also affecting patient's functional outcome.   REHAB POTENTIAL: Good  CLINICAL DECISION MAKING: Evolving/moderate complexity  EVALUATION COMPLEXITY: Moderate   GOALS:  SHORT TERM GOALS: Target date: 11/24/23 Pt will tolerate full aquatic sessions consistently without increase in pain and with improving function to demonstrate good toleration and effectiveness of intervention.  Baseline: Goal status: Met 11/24/23  2.  Pt will navigate stair into and out of pool 6 steps using step through pattern Baseline: step-to pattern Goal status: In progress 11/10/23. Partially met 11/24/23  3.  Pt will report reduction in pain by 3 NPRS post aquatic session which lasts for up to 24 hours. Baseline:  Goal status: Met 11/24/23  4.  Pt will amb without antalgic gait pattern submerged in 3.6 ft Baseline:  Goal status:MET - 11/17/23  5.  Pt will demo improved quality of gait including increased toe off and stance time on L LE with reduced favoring of L LE. Goal status: ongoing Target date:  01/23/2024  6.  Pt will demo improved L ankle AROM to at least 0 deg in DF and 7 deg in Eversion for improved stiffness and gait.  Goal status: MET 6/19 Target date:  01/23/2024     LONG TERM GOALS: Target date: 06/28/2024    Pt will demonstrate an improvement in LEFS by at least 9 points for a clinically significant improvement in self perceived disability.  Baseline: 30/80 Goal status: MET  2.  Pt wiil navigate 1 flight of steps using step alternating pattern indep Baseline:  Goal status: ongoing  3.  Pt will demonstrate improved DF AROM to be w/n 5 deg of R ankle for improved stiffness, gait, and performance of stairs.  Baseline: see chart Goal status: IN PROGRESS 4/21  4.  Pt will demo left ankle strength to be at least 4/5 in DF and WFL in PF (tested with manual resistance in open chain) in order  for improved performance of and tolerance with functional mobility Baseline: see chart Goal status:  MET  5.  Pt will amb without AD without limitation to pain Baseline:using cane or walker  Goal status: IN PROGRESS (still using cane most   6.  Pt will have improved Oswestry score to >/= 43/50 to demo MCID Baseline: Oswestry Score: 21 / 50 or 42 % Goal status: ongoing (25/50 on 6/19)  7.  Pt will be able to tolerate standing tasks for >/=15 min for household chores/tasks Baseline: 2 min Goal status: ongoing 6/19  8.  Pt will be able to demo increased hip strength to at least 4/5 for improved standing tolerance Baseline: See MMT above Goal status: ongoing   PLAN:  PT FREQUENCY: 2x/wk  PT DURATION: 8 weeks     PLANNED INTERVENTIONS: [id number]- PT Re-evaluation, 97110-Therapeutic exercises, 97530- Therapeutic activity, [id number]- Neuromuscular re-education, 97535-  Self Care, [id number]- Manual therapy, [phone]- Gait training, [phone]- Orthotic Fit/training, [phone]- Aquatic Therapy, Patient/Family education, Balance training, Stair training, Taping, Dry Needling, Joint mobilization, DME instructions, Cryotherapy, and Moist heat  PLAN FOR NEXT SESSION:  Review HEP and update if tolerable.  Cont with ankle ROM.  Gait training. Ankle stability training.  Continue gross hip and core strengthening.   Asberry Rodes, PTA  05/03/24 5:15 PM  PT reviewed note, updated POC, and spoke with pt and PTA.   Leigh Minerva III PT, DPT 05/04/24 6:50 AM

## 2024-05-04 DIAGNOSIS — J301 Allergic rhinitis due to pollen: Secondary | ICD-10-CM | POA: Diagnosis not present

## 2024-05-04 DIAGNOSIS — F331 Major depressive disorder, recurrent, moderate: Secondary | ICD-10-CM | POA: Diagnosis not present

## 2024-05-04 DIAGNOSIS — J3081 Allergic rhinitis due to animal (cat) (dog) hair and dander: Secondary | ICD-10-CM | POA: Diagnosis not present

## 2024-05-04 DIAGNOSIS — J3089 Other allergic rhinitis: Secondary | ICD-10-CM | POA: Diagnosis not present

## 2024-05-04 NOTE — Addendum Note (Signed)
 Addended by: MARGRETTE LEIGH RAMAN on: 05/04/2024 06:51 AM   Modules accepted: Orders

## 2024-05-05 ENCOUNTER — Encounter (HOSPITAL_BASED_OUTPATIENT_CLINIC_OR_DEPARTMENT_OTHER): Payer: Self-pay

## 2024-05-05 ENCOUNTER — Ambulatory Visit (HOSPITAL_BASED_OUTPATIENT_CLINIC_OR_DEPARTMENT_OTHER): Payer: Self-pay | Attending: Orthopaedic Surgery

## 2024-05-05 DIAGNOSIS — R262 Difficulty in walking, not elsewhere classified: Secondary | ICD-10-CM | POA: Diagnosis not present

## 2024-05-05 DIAGNOSIS — M6281 Muscle weakness (generalized): Secondary | ICD-10-CM | POA: Diagnosis not present

## 2024-05-05 DIAGNOSIS — M25572 Pain in left ankle and joints of left foot: Secondary | ICD-10-CM | POA: Diagnosis not present

## 2024-05-05 DIAGNOSIS — M5459 Other low back pain: Secondary | ICD-10-CM | POA: Insufficient documentation

## 2024-05-05 DIAGNOSIS — M25672 Stiffness of left ankle, not elsewhere classified: Secondary | ICD-10-CM | POA: Diagnosis not present

## 2024-05-05 NOTE — Therapy (Signed)
 OUTPATIENT PHYSICAL THERAPY LOWER EXTREMITY TREATMENT              Patient Name: Haley White MRN: [id number] DOB:[birth year], 68 y.o., female Today's Date: 05/05/2024  END OF SESSION:  PT End of Session - 04/22/24 1156     Visit Number 30    Number of Visits 44   Date for PT Re-Evaluation 06/28/24    Authorization Type [employer] mcr    Progress Note Due on Visit 29    PT Start Time 1150    PT Stop Time 1230    PT Time Calculation (min) 40 min    Activity Tolerance Patient tolerated treatment well    Behavior During Therapy WFL for tasks assessed/performed                  Past Medical History:  Diagnosis Date   Anxiety    Arthritis    Asthma    Cataract    Depression    Gout    Hyperlipidemia    Hypertension    Obese    Sleep apnea    Past Surgical History:  Procedure Laterality Date   ANKLE SURGERY Left    APPENDECTOMY  1984   APPENDECTOMY     CATARACT EXTRACTION W/ INTRAOCULAR LENS  IMPLANT, BILATERAL  2014   CESAREAN SECTION  1984, 1987, 1994   X3   CESAREAN SECTION     x3   COLONOSCOPY  11/16/2013   w/Brodie    EXTERNAL FIXATION LEG Left 06/04/2023   Procedure: EXTERNAL FIXATION LEG;  Surgeon: Barton Drape, MD;  Location: [facility];  Service: Orthopedics;  Laterality: Left;   EYE SURGERY Bilateral    lasix   FOOT FRACTURE SURGERY Left    FOOT SURGERY Left 2002   FRACTURE SURGERY Left    left ankle   HARDWARE REMOVAL Left 12/03/2023   Procedure: HARDWARE REMOVAL OF SYNDEMOSIS;  Surgeon: Barton Drape, MD;  Location: [facility];  Service: Orthopedics;  Laterality: Left;   KNEE SURGERY Left 1998   ORIF ANKLE FRACTURE Left 06/04/2023   Procedure: OPEN REDUCTION INTERNAL FIXATION (ORIF) ANKLE FRACTURE vs EX FIX;  Surgeon: Barton Drape, MD;  Location: [facility];  Service: Orthopedics;  Laterality: Left;   ROTATOR CUFF REPAIR Right 2007   shoulder     torn rotator cuff   TUBAL LIGATION  1994   TUBAL LIGATION      Patient Active Problem List   Diagnosis Date Noted   Prediabetes 04/22/2024   History of thromboembolism 04/22/2024   OSA on CPAP 08/11/2023   Ankle fracture 06/03/2023   Trimalleolar fracture of left ankle 06/02/2023   Acute left ankle pain 06/02/2023   Sleep-related hypoxia 03/24/2023   At risk for obstructive sleep apnea 03/24/2023   Acute pulmonary embolism (HCC) 05/10/2022   Allergic rhinitis 03/15/2021   Gastro-esophageal reflux disease without esophagitis 03/15/2021   Mild persistent asthma, uncomplicated 03/15/2021   Aortic atherosclerosis (HCC) by Chest CT scan on 02/02/2018  01/10/2021   Asthma    Low back pain 03/17/2019   Lumbosacral spondylosis without myelopathy 03/17/2019   Pseudogout 12/09/2017   Major depressive disorder, recurrent, severe without psychotic features (HCC)    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   Suicide attempt by drug ingestion (HCC)    Vitamin D  deficiency 08/22/2014   Hyperlipidemia, mixed 08/22/2014   Hypertension    Anxiety    Obesity, unspecified     PCP: Elsie Richards MD  REFERRING PROVIDER: Barton Drape, MD (  for ankle) Laqueta Sharper, MD (for low back)  REFERRING DIAG: S82.852D (ICD-10-CM) - Displaced trimalleolar fracture of left lower leg, subsequent encounter for closed fracture with routine healing   THERAPY DIAG:  Difficulty in walking, not elsewhere classified  Muscle weakness (generalized)  Other low back pain  Pain in left ankle and joints of left foot  Stiffness of left ankle, not elsewhere classified  Rationale for Evaluation and Treatment: Rehabilitation  ONSET DATE: July 2024  SUBJECTIVE:   SUBJECTIVE STATEMENT: Pt reports she was able to walk around 2 furniture stores without only 2-3 rest breaks. I think now that I'm walking more normally, my back is doing better! 3-4/10 pain level in ankle/foot at entry. No back pain at entry.    PERTINENT HISTORY: The patient is s/p left ankle  trimalleolar & syndesmosis ORIF, DOS: 06/05/2023.  Removal of deep hardware on 12/03/2023 Hx of foot/ankle surgery in 2022  Fibromyalgia, HTN, PE s/p covid in 2023 LBP with MRI (10/2022) evidence of chronic mild anterior wedge compression fracture of T12 and chronic anterior wedge compression deformity of L1   PAIN:  Are you having pain? Yes:  NPRS scale: 3-4/10  Pain location:  L ankle Pain description: sharp, dull, nagging, ache  4/10 Pain location: medial and lateral L ankle Pain description: sharp and dull depending on what she does   PRECAUTIONS: None   WEIGHT BEARING RESTRICTIONS: WBAT  FALLS:  Has patient fallen in last 6 months? Yes. Number of falls 1 fell down wet hill walking dog  LIVING ENVIRONMENT: Lives with: lives alone Lives in: House/apartment Stairs: Yes: External: 3 steps; none Has following equipment at home: Single point cane   OCCUPATION: retired   PLOF: Independent  PATIENT GOALS: walk normally and as pain free as able  NEXT MD VISIT: June 2025  OBJECTIVE:  Note: Objective measures were completed at Evaluation unless otherwise noted.  MRI IMPRESSION: 1. No acute findings or clear explanation for the patient's symptoms. Overall findings are similar to previous CT from 2020. 2. Interval development of a healed mild superior endplate compression deformity at L1 since 2020. No acute osseous findings. 3. Multilevel spondylosis as described, most advanced at L4-5 where there is mild spinal stenosis and asymmetric lateral recess and foraminal narrowing on the left with suspected chronic left L4 nerve root encroachment. 4. Mild asymmetric lateral recess and foraminal narrowing on the left at L5-S1 without definite nerve root encroachment.   LOWER EXTREMITY ROM:   Active ROM Right eval Left eval Left  11/17/23 Right 2/28 Left 2/28 AROM/PROM Left 4/21 AROM  L 5/12  L 6/19 L 6/30  Hip flexion                Hip extension                 Hip abduction                Hip adduction                Hip internal rotation                Hip external rotation                Knee flexion                Knee extension                Ankle dorsiflexion   -8 Lacking 2 14 Lacking 7 deg /  3  with pain Lacking 1 degree/pain with AAROM  4 1 (calf tightness) 10deg  Ankle plantarflexion   30d 42 65 55 65deg 65 54 55  Ankle inversion   Limited by 50%   43 24 12 17 22 24   Ankle eversion   Limited by 50%   10 2 with pain 12 5 pain 11 15   (Blank rows = not tested)  LEFS 4/21: 14/80 LEFS: Lower Extremity Functional Score: 30 / 80 = 37.5 % ODI Score = 17 points (34%)  LEFS 6/19: 31/80 6/19 Modified Owsestry: 50%   MMT 4+ throughout L ankle with pain into EV MMT 6/19: 5/5 DF and PF, 4+/5 In,ev.  Hip flexion 11.1lbs R, 17.7, abduction 11.1R, 19L (seated)     TODAY'S TREATMENT:   7/2 Ankle AROM 4way x20ea Ankle ABC x2 Seated towel scrunch x30 Seated HR/TR x10 ea (began to feel uncomfortable) 1/2 roll AAROM in/ev 2x10, DF/PF x10 Seated adductor squeeze with TrA and cues for breathing Standing staggered stance rocks for pre gait training Gait training without SPC- cues for step length and heel contact/toe off    6/30 Updated ROM Ankle ABC x2 Seated towel scrunch x30 Seated HR/TR x30ea Standing staggered stance for pre gait training Gait training with and without SPC Nu-step 5' L4    6/19 Updated ROM and strength LEFS ODI Goal review R shot gun technique for SIJ  STM to R glutes Review of breathing techniques    6/12 STM/IASTM (lacrosse ball from pt) to post tib (very gentle) Stm to plantar fasciitis (very gentle)  Plantar fascia stretching Gait training (cues for neutral foot placement, heel contact, and proper toe off) Weight bearing DF with manual posterior glide x10 DF stretch manual open chain Gentle talocrural mob posterior glide open chain (careful of cyst) Piriformis stretch SKTC (unable with r  LE) PPT 5 x10 LTR  Lacrosse ball under glutes, then LTR again with decreased pain.  Gait in hall with cues for toe off and heel contact      PATIENT EDUCATION:  Education details:  exercise form, relevant anatomy Person educated: Patient Education method: Explanation, Demonstration, Tactile cues, Verbal cues Education comprehension: verbalized understanding, returned demonstration, verbal cues required, tactile cues required, and needs further education     HOME EXERCISE PROGRAM: Access Code: [email or web address] URL: [email or web address] Date: 02/27/2024 Prepared by: Gellen April Marie Nonato  Exercises - Toe Yoga - Alternating Great Toe and Lesser Toe Extension  - 2 x daily - 7 x weekly - 1 sets - 10 reps - Lying Prone  - 1 x daily - 7 x weekly - 1 sets - 5 min hold - Prone Press Up On Elbows  - 1 x daily - 7 x weekly - 1-2 sets - 10 reps - Prone Hip Abduction on Slider  - 1 x daily - 7 x weekly - 2 sets - 10 reps - Supine Piriformis Stretch with Foot on Ground  - 1 x daily - 7 x weekly - 2 sets - 30 sec hold - Hooklying Gluteal Sets  - 1 x daily - 7 x weekly - 2 sets - 10 reps - Supine Transversus Abdominis Bracing - Hands on Stomach  - 1 x daily - 7 x weekly - 2 sets - 10 reps - 3-5 sec hold - Seated Ankle Eversion with Resistance  - 1 x daily - 7 x weekly - 2 sets - 10 reps - Seated Ankle Inversion with Resistance  - 1 x daily -  7 x weekly - 2 sets - 10 reps - Heel Toe Raises with Unilateral Counter Support  - 1 x daily - 7 x weekly - 2 sets - 10 reps - Shoulder extension with resistance - Neutral  - 1 x daily - 7 x weekly - 2 sets - 10 reps   ASSESSMENT:  CLINICAL IMPRESSION:  Pt progressing well with gait mechanics. She does continue to require cuing for correct performance. Report of lateral ankle/foot soreness with seated towel scrunches and HR/TR so decreased reps with these. Did have some lumbar tightness with ambulation and cued pt in TA activation/gentle  stretching. Will continue to monitor pain level and progress as tolerated.     OBJECTIVE IMPAIRMENTS: Abnormal gait, decreased activity tolerance, decreased balance, difficulty walking, decreased ROM, decreased strength, obesity, and pain.   ACTIVITY LIMITATIONS: squatting, stairs, and locomotion level  PARTICIPATION LIMITATIONS: shopping and community activity  PERSONAL FACTORS: Past/current experiences and 1 comorbidity: back dysfunction are also affecting patient's functional outcome.   REHAB POTENTIAL: Good  CLINICAL DECISION MAKING: Evolving/moderate complexity  EVALUATION COMPLEXITY: Moderate   GOALS:  SHORT TERM GOALS: Target date: 11/24/23 Pt will tolerate full aquatic sessions consistently without increase in pain and with improving function to demonstrate good toleration and effectiveness of intervention.  Baseline: Goal status: Met 11/24/23  2.  Pt will navigate stair into and out of pool 6 steps using step through pattern Baseline: step-to pattern Goal status: In progress 11/10/23. Partially met 11/24/23  3.  Pt will report reduction in pain by 3 NPRS post aquatic session which lasts for up to 24 hours. Baseline:  Goal status: Met 11/24/23  4.  Pt will amb without antalgic gait pattern submerged in 3.6 ft Baseline:  Goal status:MET - 11/17/23  5.  Pt will demo improved quality of gait including increased toe off and stance time on L LE with reduced favoring of L LE. Goal status: ongoing Target date:  01/23/2024  6.  Pt will demo improved L ankle AROM to at least 0 deg in DF and 7 deg in Eversion for improved stiffness and gait.  Goal status: MET 6/19 Target date:  01/23/2024     LONG TERM GOALS: Target date: 06/28/2024    Pt will demonstrate an improvement in LEFS by at least 9 points for a clinically significant improvement in self perceived disability.  Baseline: 30/80 Goal status: MET  2.  Pt wiil navigate 1 flight of steps using step alternating pattern  indep Baseline:  Goal status: ongoing  3.  Pt will demonstrate improved DF AROM to be w/n 5 deg of R ankle for improved stiffness, gait, and performance of stairs.  Baseline: see chart Goal status: IN PROGRESS 4/21  4.  Pt will demo left ankle strength to be at least 4/5 in DF and WFL in PF (tested with manual resistance in open chain) in order for improved performance of and tolerance with functional mobility Baseline: see chart Goal status:  MET  5.  Pt will amb without AD without limitation to pain Baseline:using cane or walker  Goal status: IN PROGRESS (still using cane most   6.  Pt will have improved Oswestry score to >/= 43/50 to demo MCID Baseline: Oswestry Score: 21 / 50 or 42 % Goal status: ongoing (25/50 on 6/19)  7.  Pt will be able to tolerate standing tasks for >/=15 min for household chores/tasks Baseline: 2 min Goal status: ongoing 6/19  8.  Pt will be able to demo  increased hip strength to at least 4/5 for improved standing tolerance Baseline: See MMT above Goal status: ongoing   PLAN:  PT FREQUENCY: 2x/wk  PT DURATION: 8 weeks     PLANNED INTERVENTIONS: [id number]- PT Re-evaluation, 97110-Therapeutic exercises, 97530- Therapeutic activity, 97112- Neuromuscular re-education, 97535- Self Care, [id number]- Manual therapy, [phone]- Gait training, [phone]- Orthotic Fit/training, [phone]- Aquatic Therapy, Patient/Family education, Balance training, Stair training, Taping, Dry Needling, Joint mobilization, DME instructions, Cryotherapy, and Moist heat  PLAN FOR NEXT SESSION:  Review HEP and update if tolerable.  Cont with ankle ROM.  Gait training. Ankle stability training.  Continue gross hip and core strengthening.   Asberry Rodes, PTA  05/05/24 11:22 AM

## 2024-05-10 ENCOUNTER — Encounter (HOSPITAL_BASED_OUTPATIENT_CLINIC_OR_DEPARTMENT_OTHER): Payer: Self-pay | Admitting: Physical Therapy

## 2024-05-10 ENCOUNTER — Ambulatory Visit (HOSPITAL_BASED_OUTPATIENT_CLINIC_OR_DEPARTMENT_OTHER): Admitting: Physical Therapy

## 2024-05-10 DIAGNOSIS — R262 Difficulty in walking, not elsewhere classified: Secondary | ICD-10-CM

## 2024-05-10 DIAGNOSIS — M25672 Stiffness of left ankle, not elsewhere classified: Secondary | ICD-10-CM | POA: Diagnosis not present

## 2024-05-10 DIAGNOSIS — M25572 Pain in left ankle and joints of left foot: Secondary | ICD-10-CM

## 2024-05-10 DIAGNOSIS — M6281 Muscle weakness (generalized): Secondary | ICD-10-CM

## 2024-05-10 DIAGNOSIS — M5459 Other low back pain: Secondary | ICD-10-CM | POA: Diagnosis not present

## 2024-05-10 NOTE — Therapy (Signed)
 OUTPATIENT PHYSICAL THERAPY LOWER EXTREMITY TREATMENT              Patient Name: Haley White MRN: [id number] DOB:07-08-1956, 68 y.o., female Today's Date: 05/11/2024  END OF SESSION: PT End of Session - 05/10/2024    Visit Number 32   Number of Visits 44   Date for PT Re-Evaluation 06/28/2024   Authorization Type [employer] MCR   PT Start Time  9062   PT Stop Time 1012   PT Time Calculation (min) 35 min   Activity Tolerance Patient tolerated treatment well   Behavior During Therapy  WFL for tasks assessed/performed             Past Medical History:  Diagnosis Date   Anxiety    Arthritis    Asthma    Cataract    Depression    Gout    Hyperlipidemia    Hypertension    Obese    Sleep apnea    Past Surgical History:  Procedure Laterality Date   ANKLE SURGERY Left    APPENDECTOMY  1984   APPENDECTOMY     CATARACT EXTRACTION W/ INTRAOCULAR LENS  IMPLANT, BILATERAL  2014   CESAREAN SECTION  1984, 1987, 1994   X3   CESAREAN SECTION     x3   COLONOSCOPY  11/16/2013   w/Brodie    EXTERNAL FIXATION LEG Left 06/04/2023   Procedure: EXTERNAL FIXATION LEG;  Surgeon: Barton Drape, MD;  Location: [facility];  Service: Orthopedics;  Laterality: Left;   EYE SURGERY Bilateral    lasix   FOOT FRACTURE SURGERY Left    FOOT SURGERY Left 2002   FRACTURE SURGERY Left    left ankle   HARDWARE REMOVAL Left 12/03/2023   Procedure: HARDWARE REMOVAL OF SYNDEMOSIS;  Surgeon: Barton Drape, MD;  Location: [facility];  Service: Orthopedics;  Laterality: Left;   KNEE SURGERY Left 1998   ORIF ANKLE FRACTURE Left 06/04/2023   Procedure: OPEN REDUCTION INTERNAL FIXATION (ORIF) ANKLE FRACTURE vs EX FIX;  Surgeon: Barton Drape, MD;  Location: [facility];  Service: Orthopedics;  Laterality: Left;   ROTATOR CUFF REPAIR Right 2007   shoulder     torn rotator cuff   TUBAL LIGATION  1994   TUBAL LIGATION     Patient Active Problem List   Diagnosis Date Noted    Prediabetes 04/22/2024   History of thromboembolism 04/22/2024   OSA on CPAP 08/11/2023   Ankle fracture 06/03/2023   Trimalleolar fracture of left ankle 06/02/2023   Acute left ankle pain 06/02/2023   Sleep-related hypoxia 03/24/2023   At risk for obstructive sleep apnea 03/24/2023   Acute pulmonary embolism (HCC) 05/10/2022   Allergic rhinitis 03/15/2021   Gastro-esophageal reflux disease without esophagitis 03/15/2021   Mild persistent asthma, uncomplicated 03/15/2021   Aortic atherosclerosis (HCC) by Chest CT scan on 02/02/2018  01/10/2021   Asthma    Low back pain 03/17/2019   Lumbosacral spondylosis without myelopathy 03/17/2019   Pseudogout 12/09/2017   Major depressive disorder, recurrent, severe without psychotic features (HCC)    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   Suicide attempt by drug ingestion (HCC)    Vitamin D  deficiency 08/22/2014   Hyperlipidemia, mixed 08/22/2014   Hypertension    Anxiety    Obesity, unspecified     PCP: Elsie Richards MD  REFERRING PROVIDER: Barton Drape, MD (for ankle) Laqueta Sharper, MD (for low back)  REFERRING DIAG: S82.852D (ICD-10-CM) - Displaced trimalleolar fracture of left lower leg, subsequent  encounter for closed fracture with routine healing   THERAPY DIAG:  Pain in left ankle and joints of left foot  Stiffness of left ankle, not elsewhere classified  Muscle weakness (generalized)  Difficulty in walking, not elsewhere classified  Rationale for Evaluation and Treatment: Rehabilitation  ONSET DATE: July 2024  SUBJECTIVE:   SUBJECTIVE STATEMENT: Pt is doing better since the hardware removal.  Pt states she is has improved mobility without thinking about pain.  Pt reports she was sore after prior Rx and the soreness went away.  Pt states she had increased pain and stiffness yesterday due to the rainy weather.  Pt had a follow up with MD on 7/1 and pt states MD wanted her to come back in 2 weeks for suture  removal.  Pt is ambulating without cane and feels good without her cane.  PERTINENT HISTORY: The patient is s/p left ankle trimalleolar & syndesmosis ORIF, DOS: 06/05/2023.  Removal of hardware on 04/26/24.  Removal of deep hardware on 12/03/2023.   Hx of foot/ankle surgery in 2022  Fibromyalgia, HTN, PE s/p covid in 2023 LBP with MRI (10/2022) evidence of chronic mild anterior wedge compression fracture of T12 and chronic anterior wedge compression deformity of L1   PAIN:  Are you having pain? Yes:  NPRS scale: 0-1/10  Pain location:  L ankle   2-3/10 Pain location: R sided lumbar    PRECAUTIONS: None   WEIGHT BEARING RESTRICTIONS: WBAT  FALLS:  Has patient fallen in last 6 months? Yes. Number of falls 1 fell down wet hill walking dog  LIVING ENVIRONMENT: Lives with: lives alone Lives in: House/apartment Stairs: Yes: External: 3 steps; none Has following equipment at home: Single point cane   OCCUPATION: retired   PLOF: Independent  PATIENT GOALS: walk normally and as pain free as able  NEXT MD VISIT: June 2025  OBJECTIVE:  Note: Objective measures were completed at Evaluation unless otherwise noted.  MRI IMPRESSION: 1. No acute findings or clear explanation for the patient's symptoms. Overall findings are similar to previous CT from 2020. 2. Interval development of a healed mild superior endplate compression deformity at L1 since 2020. No acute osseous findings. 3. Multilevel spondylosis as described, most advanced at L4-5 where there is mild spinal stenosis and asymmetric lateral recess and foraminal narrowing on the left with suspected chronic left L4 nerve root encroachment. 4. Mild asymmetric lateral recess and foraminal narrowing on the left at L5-S1 without definite nerve root encroachment.   LOWER EXTREMITY ROM:   Active ROM Right eval Left eval Left  11/17/23 Right 2/28 Left 2/28 AROM/PROM Left 4/21 AROM  L 5/12  L 6/19 L 6/30  Hip flexion                 Hip extension                Hip abduction                Hip adduction                Hip internal rotation                Hip external rotation                Knee flexion                Knee extension                Ankle dorsiflexion   -  8 Lacking 2 14 Lacking 7 deg / 3  with pain Lacking 1 degree/pain with AAROM  4 1 (calf tightness) 10deg  Ankle plantarflexion   30d 42 65 55 65deg 65 54 55  Ankle inversion   Limited by 50%   43 24 12 17 22 24   Ankle eversion   Limited by 50%   10 2 with pain 12 5 pain 11 15   (Blank rows = not tested)  LEFS 4/21: 14/80 LEFS: Lower Extremity Functional Score: 30 / 80 = 37.5 % ODI Score = 17 points (34%)  LEFS 6/19: 31/80 6/19 Modified Owsestry: 50%   MMT 4+ throughout L ankle with pain into EV MMT 6/19: 5/5 DF and PF, 4+/5 In,ev.  Hip flexion 11.1lbs R, 17.7, abduction 11.1R, 19L (seated)     TODAY'S TREATMENT:   7/7 Reviewed current function, pain levels, and response to prior Rx.  Ankle AROM 4 way 2x10 reps each Ankle circles x 10 cw and ccw Seated towel scrunches 3x10 Pt received L ankle PROM in DF, PF, Inv, and Eve per pt and tissue tolerance Standing staggered stance rocks for pre gait training Longsitting gastroc stretch 3x20 sec     7/2 Ankle AROM 4way x20ea Ankle ABC x2 Seated towel scrunch x30 Seated HR/TR x10 ea (began to feel uncomfortable) 1/2 roll AAROM in/ev 2x10, DF/PF x10 Seated adductor squeeze with TrA and cues for breathing Standing staggered stance rocks for pre gait training Gait training without SPC- cues for step length and heel contact/toe off    6/30 Updated ROM Ankle ABC x2 Seated towel scrunch x30 Seated HR/TR x30ea Standing staggered stance for pre gait training Gait training with and without SPC Nu-step 5' L4    6/19 Updated ROM and strength LEFS ODI Goal review R shot gun technique for SIJ  STM to R glutes Review of breathing techniques    6/12 STM/IASTM  (lacrosse ball from pt) to post tib (very gentle) Stm to plantar fasciitis (very gentle)  Plantar fascia stretching Gait training (cues for neutral foot placement, heel contact, and proper toe off) Weight bearing DF with manual posterior glide x10 DF stretch manual open chain Gentle talocrural mob posterior glide open chain (careful of cyst) Piriformis stretch SKTC (unable with r LE) PPT 5 x10 LTR  Lacrosse ball under glutes, then LTR again with decreased pain.  Gait in hall with cues for toe off and heel contact      PATIENT EDUCATION:  Education details:  exercise form, relevant anatomy Person educated: Patient Education method: Explanation, Demonstration, Tactile cues, Verbal cues Education comprehension: verbalized understanding, returned demonstration, verbal cues required, tactile cues required, and needs further education     HOME EXERCISE PROGRAM: Access Code: [email or web address] URL: [email or web address] Date: 02/27/2024 Prepared by: Gellen April Marie Nonato  Exercises - Toe Yoga - Alternating Great Toe and Lesser Toe Extension  - 2 x daily - 7 x weekly - 1 sets - 10 reps - Lying Prone  - 1 x daily - 7 x weekly - 1 sets - 5 min hold - Prone Press Up On Elbows  - 1 x daily - 7 x weekly - 1-2 sets - 10 reps - Prone Hip Abduction on Slider  - 1 x daily - 7 x weekly - 2 sets - 10 reps - Supine Piriformis Stretch with Foot on Ground  - 1 x daily - 7 x weekly - 2 sets - 30 sec hold - Hooklying Gluteal Sets  - 1 x  daily - 7 x weekly - 2 sets - 10 reps - Supine Transversus Abdominis Bracing - Hands on Stomach  - 1 x daily - 7 x weekly - 2 sets - 10 reps - 3-5 sec hold - Seated Ankle Eversion with Resistance  - 1 x daily - 7 x weekly - 2 sets - 10 reps - Seated Ankle Inversion with Resistance  - 1 x daily - 7 x weekly - 2 sets - 10 reps - Heel Toe Raises with Unilateral Counter Support  - 1 x daily - 7 x weekly - 2 sets - 10 reps - Shoulder extension with resistance -  Neutral  - 1 x daily - 7 x weekly - 2 sets - 10 reps   ASSESSMENT:  CLINICAL IMPRESSION:  Pt is doing better since the hardware removal and reports improved mobility.  Pt is ambulating without cane and feels good without her cane.  Pt performed exercises well and reported stiffness in lateral ankle during Rx.  She tolerated treatment well having no increased ankle pain after Rx.  Pt will benefit from continued skilled PT to address impairments and goals and to improve overall function.     OBJECTIVE IMPAIRMENTS: Abnormal gait, decreased activity tolerance, decreased balance, difficulty walking, decreased ROM, decreased strength, obesity, and pain.   ACTIVITY LIMITATIONS: squatting, stairs, and locomotion level  PARTICIPATION LIMITATIONS: shopping and community activity  PERSONAL FACTORS: Past/current experiences and 1 comorbidity: back dysfunction are also affecting patient's functional outcome.   REHAB POTENTIAL: Good  CLINICAL DECISION MAKING: Evolving/moderate complexity  EVALUATION COMPLEXITY: Moderate   GOALS:  SHORT TERM GOALS: Target date: 11/24/23 Pt will tolerate full aquatic sessions consistently without increase in pain and with improving function to demonstrate good toleration and effectiveness of intervention.  Baseline: Goal status: Met 11/24/23  2.  Pt will navigate stair into and out of pool 6 steps using step through pattern Baseline: step-to pattern Goal status: In progress 11/10/23. Partially met 11/24/23  3.  Pt will report reduction in pain by 3 NPRS post aquatic session which lasts for up to 24 hours. Baseline:  Goal status: Met 11/24/23  4.  Pt will amb without antalgic gait pattern submerged in 3.6 ft Baseline:  Goal status:MET - 11/17/23  5.  Pt will demo improved quality of gait including increased toe off and stance time on L LE with reduced favoring of L LE. Goal status: ongoing Target date:  01/23/2024  6.  Pt will demo improved L ankle AROM to at  least 0 deg in DF and 7 deg in Eversion for improved stiffness and gait.  Goal status: MET 6/19 Target date:  01/23/2024     LONG TERM GOALS: Target date: 06/28/2024    Pt will demonstrate an improvement in LEFS by at least 9 points for a clinically significant improvement in self perceived disability.  Baseline: 30/80 Goal status: MET  2.  Pt wiil navigate 1 flight of steps using step alternating pattern indep Baseline:  Goal status: ongoing  3.  Pt will demonstrate improved DF AROM to be w/n 5 deg of R ankle for improved stiffness, gait, and performance of stairs.  Baseline: see chart Goal status: IN PROGRESS 4/21  4.  Pt will demo left ankle strength to be at least 4/5 in DF and WFL in PF (tested with manual resistance in open chain) in order for improved performance of and tolerance with functional mobility Baseline: see chart Goal status:  MET  5.  Pt will  amb without AD without limitation to pain Baseline:using cane or walker  Goal status: IN PROGRESS (still using cane most   6.  Pt will have improved Oswestry score to >/= 43/50 to demo MCID Baseline: Oswestry Score: 21 / 50 or 42 % Goal status: ongoing (25/50 on 6/19)  7.  Pt will be able to tolerate standing tasks for >/=15 min for household chores/tasks Baseline: 2 min Goal status: ongoing 6/19  8.  Pt will be able to demo increased hip strength to at least 4/5 for improved standing tolerance Baseline: See MMT above Goal status: ongoing   PLAN:  PT FREQUENCY: 2x/wk  PT DURATION: 8 weeks     PLANNED INTERVENTIONS: 97164- PT Re-evaluation, 97110-Therapeutic exercises, 97530- Therapeutic activity, 97112- Neuromuscular re-education, 97535- Self Care, [id number]- Manual therapy, [phone]- Gait training, [phone]- Orthotic Fit/training, [phone]- Aquatic Therapy, Patient/Family education, Balance training, Stair training, Taping, Dry Needling, Joint mobilization, DME instructions, Cryotherapy, and Moist heat  PLAN FOR NEXT  SESSION:  Cont with ankle ROM.  Gait training.  Continue gross hip and core strengthening.   Leigh Minerva III PT, DPT 05/11/24 7:39 PM

## 2024-05-11 DIAGNOSIS — J3089 Other allergic rhinitis: Secondary | ICD-10-CM | POA: Diagnosis not present

## 2024-05-11 DIAGNOSIS — J301 Allergic rhinitis due to pollen: Secondary | ICD-10-CM | POA: Diagnosis not present

## 2024-05-11 DIAGNOSIS — J3081 Allergic rhinitis due to animal (cat) (dog) hair and dander: Secondary | ICD-10-CM | POA: Diagnosis not present

## 2024-05-13 ENCOUNTER — Ambulatory Visit (HOSPITAL_BASED_OUTPATIENT_CLINIC_OR_DEPARTMENT_OTHER): Admitting: Physical Therapy

## 2024-05-13 ENCOUNTER — Encounter (HOSPITAL_BASED_OUTPATIENT_CLINIC_OR_DEPARTMENT_OTHER): Payer: Self-pay | Admitting: Physical Therapy

## 2024-05-13 DIAGNOSIS — R262 Difficulty in walking, not elsewhere classified: Secondary | ICD-10-CM

## 2024-05-13 DIAGNOSIS — M25572 Pain in left ankle and joints of left foot: Secondary | ICD-10-CM | POA: Diagnosis not present

## 2024-05-13 DIAGNOSIS — M25672 Stiffness of left ankle, not elsewhere classified: Secondary | ICD-10-CM

## 2024-05-13 DIAGNOSIS — M6281 Muscle weakness (generalized): Secondary | ICD-10-CM

## 2024-05-13 DIAGNOSIS — M5459 Other low back pain: Secondary | ICD-10-CM | POA: Diagnosis not present

## 2024-05-13 NOTE — Therapy (Signed)
 OUTPATIENT PHYSICAL THERAPY LOWER EXTREMITY TREATMENT              Patient Name: Haley White MRN: [id number] DOB:Nov 04, 1956, 68 y.o., female Today's Date: 05/14/2024  END OF SESSION: PT End of Session - 05/13/2024    Visit Number 33   Number of Visits 44   Date for PT Re-Evaluation 06/28/2024   Authorization Type [employer] MCR   PT Start Time  1029   PT Stop Time 1104   PT Time Calculation (min) 35 min   Activity Tolerance Patient tolerated treatment well   Behavior During Therapy  WFL for tasks assessed/performed             Past Medical History:  Diagnosis Date   Anxiety    Arthritis    Asthma    Cataract    Depression    Gout    Hyperlipidemia    Hypertension    Obese    Sleep apnea    Past Surgical History:  Procedure Laterality Date   ANKLE SURGERY Left    APPENDECTOMY  1984   APPENDECTOMY     CATARACT EXTRACTION W/ INTRAOCULAR LENS  IMPLANT, BILATERAL  2014   CESAREAN SECTION  1984, 1987, 1994   X3   CESAREAN SECTION     x3   COLONOSCOPY  11/16/2013   w/Brodie    EXTERNAL FIXATION LEG Left 06/04/2023   Procedure: EXTERNAL FIXATION LEG;  Surgeon: Barton Drape, MD;  Location: [facility];  Service: Orthopedics;  Laterality: Left;   EYE SURGERY Bilateral    lasix   FOOT FRACTURE SURGERY Left    FOOT SURGERY Left 2002   FRACTURE SURGERY Left    left ankle   HARDWARE REMOVAL Left 12/03/2023   Procedure: HARDWARE REMOVAL OF SYNDEMOSIS;  Surgeon: Barton Drape, MD;  Location: [facility];  Service: Orthopedics;  Laterality: Left;   KNEE SURGERY Left 1998   ORIF ANKLE FRACTURE Left 06/04/2023   Procedure: OPEN REDUCTION INTERNAL FIXATION (ORIF) ANKLE FRACTURE vs EX FIX;  Surgeon: Barton Drape, MD;  Location: [facility];  Service: Orthopedics;  Laterality: Left;   ROTATOR CUFF REPAIR Right 2007   shoulder     torn rotator cuff   TUBAL LIGATION  1994   TUBAL LIGATION     Patient Active Problem List   Diagnosis Date Noted    Prediabetes 04/22/2024   History of thromboembolism 04/22/2024   OSA on CPAP 08/11/2023   Ankle fracture 06/03/2023   Trimalleolar fracture of left ankle 06/02/2023   Acute left ankle pain 06/02/2023   Sleep-related hypoxia 03/24/2023   At risk for obstructive sleep apnea 03/24/2023   Acute pulmonary embolism (HCC) 05/10/2022   Allergic rhinitis 03/15/2021   Gastro-esophageal reflux disease without esophagitis 03/15/2021   Mild persistent asthma, uncomplicated 03/15/2021   Aortic atherosclerosis (HCC) by Chest CT scan on 02/02/2018  01/10/2021   Asthma    Low back pain 03/17/2019   Lumbosacral spondylosis without myelopathy 03/17/2019   Pseudogout 12/09/2017   Major depressive disorder, recurrent, severe without psychotic features (HCC)    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   Suicide attempt by drug ingestion (HCC)    Vitamin D  deficiency 08/22/2014   Hyperlipidemia, mixed 08/22/2014   Hypertension    Anxiety    Obesity, unspecified     PCP: Elsie Richards MD  REFERRING PROVIDER: Barton Drape, MD (for ankle) Laqueta Sharper, MD (for low back)  REFERRING DIAG: S82.852D (ICD-10-CM) - Displaced trimalleolar fracture of left lower leg, subsequent  encounter for closed fracture with routine healing   THERAPY DIAG:  Pain in left ankle and joints of left foot  Stiffness of left ankle, not elsewhere classified  Muscle weakness (generalized)  Difficulty in walking, not elsewhere classified  Rationale for Evaluation and Treatment: Rehabilitation  ONSET DATE: July 2024  SUBJECTIVE:   SUBJECTIVE STATEMENT: Pt is doing better since the hardware removal.  Pt reports she was a little sore after prior Rx.  Pt is ambulating without cane and feels good without her cane.  Pt states she doesn't feel her back and she thinks it is probably due to her improved gait.  Pt reports she performed the gastroc stretch at home.    PERTINENT HISTORY: The patient is s/p left ankle  trimalleolar & syndesmosis ORIF, DOS: 06/05/2023.  Removal of hardware on 04/26/24.  Removal of deep hardware on 12/03/2023.   Hx of foot/ankle surgery in 2022  Fibromyalgia, HTN, PE s/p covid in 2023 LBP with MRI (10/2022) evidence of chronic mild anterior wedge compression fracture of T12 and chronic anterior wedge compression deformity of L1   PAIN:  Are you having pain? Yes:  NPRS scale: 0-1/10  Pain location:  L ankle   2-3/10 Pain location: R sided lumbar    PRECAUTIONS: None   WEIGHT BEARING RESTRICTIONS: WBAT  FALLS:  Has patient fallen in last 6 months? Yes. Number of falls 1 fell down wet hill walking dog  LIVING ENVIRONMENT: Lives with: lives alone Lives in: House/apartment Stairs: Yes: External: 3 steps; none Has following equipment at home: Single point cane   OCCUPATION: retired   PLOF: Independent  PATIENT GOALS: walk normally and as pain free as able  NEXT MD VISIT: June 2025  OBJECTIVE:  Note: Objective measures were completed at Evaluation unless otherwise noted.  MRI IMPRESSION: 1. No acute findings or clear explanation for the patient's symptoms. Overall findings are similar to previous CT from 2020. 2. Interval development of a healed mild superior endplate compression deformity at L1 since 2020. No acute osseous findings. 3. Multilevel spondylosis as described, most advanced at L4-5 where there is mild spinal stenosis and asymmetric lateral recess and foraminal narrowing on the left with suspected chronic left L4 nerve root encroachment. 4. Mild asymmetric lateral recess and foraminal narrowing on the left at L5-S1 without definite nerve root encroachment.   LOWER EXTREMITY ROM:   Active ROM Right eval Left eval Left  11/17/23 Right 2/28 Left 2/28 AROM/PROM Left 4/21 AROM  L 5/12  L 6/19 L 6/30  Hip flexion                Hip extension                Hip abduction                Hip adduction                Hip internal  rotation                Hip external rotation                Knee flexion                Knee extension                Ankle dorsiflexion   -8 Lacking 2 14 Lacking 7 deg / 3  with pain Lacking 1 degree/pain with AAROM  4 1 (calf tightness) 10deg  Ankle plantarflexion   30d 42 65 55 65deg 65 54 55  Ankle inversion   Limited by 50%   43 24 12 17 22 24   Ankle eversion   Limited by 50%   10 2 with pain 12 5 pain 11 15   (Blank rows = not tested)  LEFS 4/21: 14/80 LEFS: Lower Extremity Functional Score: 30 / 80 = 37.5 % ODI Score = 17 points (34%)  LEFS 6/19: 31/80 6/19 Modified Owsestry: 50%   MMT 4+ throughout L ankle with pain into EV MMT 6/19: 5/5 DF and PF, 4+/5 In,ev.  Hip flexion 11.1lbs R, 17.7, abduction 11.1R, 19L (seated)     TODAY'S TREATMENT:  7/10 Pt received L ankle DF, PF, Inv, and Eve PROM per pt and tissue tolerance.  Ankle ABC x 1 rep Ankle pumps 2x10 Ankle circles 2x10 cw, ccw Seated towel scrunches 3x10 Seated BAPS bilat F/B 2x10 Longsitting gastroc stretch 3x30 sec  Pt ambulated in the hallway without AD and has improved heel to toe gait.  She has a slow gait and does favor L LE.   7/7 Reviewed current function, pain levels, and response to prior Rx.  Ankle AROM 4 way 2x10 reps each Ankle circles x 10 cw and ccw Seated towel scrunches 3x10 Pt received L ankle PROM in DF, PF, Inv, and Eve per pt and tissue tolerance Standing staggered stance rocks for pre gait training Longsitting gastroc stretch 3x20 sec     7/2 Ankle AROM 4way x20ea Ankle ABC x2 Seated towel scrunch x30 Seated HR/TR x10 ea (began to feel uncomfortable) 1/2 roll AAROM in/ev 2x10, DF/PF x10 Seated adductor squeeze with TrA and cues for breathing Standing staggered stance rocks for pre gait training Gait training without SPC- cues for step length and heel contact/toe off    6/30 Updated ROM Ankle ABC x2 Seated towel scrunch x30 Seated HR/TR x30ea Standing staggered  stance for pre gait training Gait training with and without SPC Nu-step 5' L4    6/19 Updated ROM and strength LEFS ODI Goal review R shot gun technique for SIJ  STM to R glutes Review of breathing techniques      PATIENT EDUCATION:  Education details:  exercise form, relevant anatomy Person educated: Patient Education method: Explanation, Demonstration, Tactile cues, Verbal cues Education comprehension: verbalized understanding, returned demonstration, verbal cues required, tactile cues required, and needs further education     HOME EXERCISE PROGRAM: Access Code: [email or web address] URL: [email or web address] Date: 02/27/2024 Prepared by: Gellen April Marie Nonato  Exercises - Toe Yoga - Alternating Great Toe and Lesser Toe Extension  - 2 x daily - 7 x weekly - 1 sets - 10 reps - Lying Prone  - 1 x daily - 7 x weekly - 1 sets - 5 min hold - Prone Press Up On Elbows  - 1 x daily - 7 x weekly - 1-2 sets - 10 reps - Prone Hip Abduction on Slider  - 1 x daily - 7 x weekly - 2 sets - 10 reps - Supine Piriformis Stretch with Foot on Ground  - 1 x daily - 7 x weekly - 2 sets - 30 sec hold - Hooklying Gluteal Sets  - 1 x daily - 7 x weekly - 2 sets - 10 reps - Supine Transversus Abdominis Bracing - Hands on Stomach  - 1 x daily - 7 x weekly - 2 sets - 10 reps - 3-5 sec hold - Seated Ankle Eversion with Resistance  -  1 x daily - 7 x weekly - 2 sets - 10 reps - Seated Ankle Inversion with Resistance  - 1 x daily - 7 x weekly - 2 sets - 10 reps - Heel Toe Raises with Unilateral Counter Support  - 1 x daily - 7 x weekly - 2 sets - 10 reps - Shoulder extension with resistance - Neutral  - 1 x daily - 7 x weekly - 2 sets - 10 reps   ASSESSMENT:  CLINICAL IMPRESSION:  Pt is 2 weeks and 3 days s/p hardware removal and reports improved sx's since the hardware removal.  She reports improved mobility and states her back has not been bothering her.  Pt is ambulating without cane and  feels good without her cane.  Pt performed exercises well without c/o's.  She tolerated treatment well and reports having a little stiffness, but no pain after Rx.  Pt will benefit from continued skilled PT to address impairments and goals and to improve overall function.      OBJECTIVE IMPAIRMENTS: Abnormal gait, decreased activity tolerance, decreased balance, difficulty walking, decreased ROM, decreased strength, obesity, and pain.   ACTIVITY LIMITATIONS: squatting, stairs, and locomotion level  PARTICIPATION LIMITATIONS: shopping and community activity  PERSONAL FACTORS: Past/current experiences and 1 comorbidity: back dysfunction are also affecting patient's functional outcome.   REHAB POTENTIAL: Good  CLINICAL DECISION MAKING: Evolving/moderate complexity  EVALUATION COMPLEXITY: Moderate   GOALS:  SHORT TERM GOALS: Target date: 11/24/23 Pt will tolerate full aquatic sessions consistently without increase in pain and with improving function to demonstrate good toleration and effectiveness of intervention.  Baseline: Goal status: Met 11/24/23  2.  Pt will navigate stair into and out of pool 6 steps using step through pattern Baseline: step-to pattern Goal status: In progress 11/10/23. Partially met 11/24/23  3.  Pt will report reduction in pain by 3 NPRS post aquatic session which lasts for up to 24 hours. Baseline:  Goal status: Met 11/24/23  4.  Pt will amb without antalgic gait pattern submerged in 3.6 ft Baseline:  Goal status:MET - 11/17/23  5.  Pt will demo improved quality of gait including increased toe off and stance time on L LE with reduced favoring of L LE. Goal status: ongoing Target date:  01/23/2024  6.  Pt will demo improved L ankle AROM to at least 0 deg in DF and 7 deg in Eversion for improved stiffness and gait.  Goal status: MET 6/19 Target date:  01/23/2024     LONG TERM GOALS: Target date: 06/28/2024    Pt will demonstrate an improvement in LEFS by  at least 9 points for a clinically significant improvement in self perceived disability.  Baseline: 30/80 Goal status: MET  2.  Pt wiil navigate 1 flight of steps using step alternating pattern indep Baseline:  Goal status: ongoing  3.  Pt will demonstrate improved DF AROM to be w/n 5 deg of R ankle for improved stiffness, gait, and performance of stairs.  Baseline: see chart Goal status: IN PROGRESS 4/21  4.  Pt will demo left ankle strength to be at least 4/5 in DF and WFL in PF (tested with manual resistance in open chain) in order for improved performance of and tolerance with functional mobility Baseline: see chart Goal status:  MET  5.  Pt will amb without AD without limitation to pain Baseline:using cane or walker  Goal status: IN PROGRESS (still using cane most   6.  Pt will have improved  Oswestry score to >/= 43/50 to demo MCID Baseline: Oswestry Score: 21 / 50 or 42 % Goal status: ongoing (25/50 on 6/19)  7.  Pt will be able to tolerate standing tasks for >/=15 min for household chores/tasks Baseline: 2 min Goal status: ongoing 6/19  8.  Pt will be able to demo increased hip strength to at least 4/5 for improved standing tolerance Baseline: See MMT above Goal status: ongoing   PLAN:  PT FREQUENCY: 2x/wk  PT DURATION: 8 weeks     PLANNED INTERVENTIONS: 97164- PT Re-evaluation, 97110-Therapeutic exercises, 97530- Therapeutic activity, 97112- Neuromuscular re-education, 97535- Self Care, [id number]- Manual therapy, [phone]- Gait training, [phone]- Orthotic Fit/training, [phone]- Aquatic Therapy, Patient/Family education, Balance training, Stair training, Taping, Dry Needling, Joint mobilization, DME instructions, Cryotherapy, and Moist heat  PLAN FOR NEXT SESSION:  Cont with ankle ROM.  Gait training.  Continue gross hip and core strengthening.   Leigh Minerva III PT, DPT 05/14/24 2:10 PM

## 2024-05-14 DIAGNOSIS — J301 Allergic rhinitis due to pollen: Secondary | ICD-10-CM | POA: Diagnosis not present

## 2024-05-14 DIAGNOSIS — J3089 Other allergic rhinitis: Secondary | ICD-10-CM | POA: Diagnosis not present

## 2024-05-14 DIAGNOSIS — J3081 Allergic rhinitis due to animal (cat) (dog) hair and dander: Secondary | ICD-10-CM | POA: Diagnosis not present

## 2024-05-17 ENCOUNTER — Ambulatory Visit (HOSPITAL_BASED_OUTPATIENT_CLINIC_OR_DEPARTMENT_OTHER): Admitting: Physical Therapy

## 2024-05-17 ENCOUNTER — Encounter (HOSPITAL_BASED_OUTPATIENT_CLINIC_OR_DEPARTMENT_OTHER): Payer: Self-pay | Admitting: Physical Therapy

## 2024-05-17 DIAGNOSIS — M25572 Pain in left ankle and joints of left foot: Secondary | ICD-10-CM

## 2024-05-17 DIAGNOSIS — M6281 Muscle weakness (generalized): Secondary | ICD-10-CM

## 2024-05-17 DIAGNOSIS — M25672 Stiffness of left ankle, not elsewhere classified: Secondary | ICD-10-CM

## 2024-05-17 DIAGNOSIS — R262 Difficulty in walking, not elsewhere classified: Secondary | ICD-10-CM

## 2024-05-17 DIAGNOSIS — M5459 Other low back pain: Secondary | ICD-10-CM | POA: Diagnosis not present

## 2024-05-17 NOTE — Therapy (Signed)
 OUTPATIENT PHYSICAL THERAPY LOWER EXTREMITY TREATMENT              Patient Name: Haley White MRN: [id number] DOB:June 11, 1956, 68 y.o., female Today's Date: 05/17/2024  END OF SESSION: PT End of Session - 05/17/2024    Visit Number 34   Number of Visits 44   Date for PT Re-Evaluation 06/28/2024   Authorization Type [employer] MCR   PT Start Time 0940   PT Stop Time 1022   PT Time Calculation (min) 42 min   Activity Tolerance Patient tolerated treatment well   Behavior During Therapy  WFL for tasks assessed/performed             Past Medical History:  Diagnosis Date   Anxiety    Arthritis    Asthma    Cataract    Depression    Gout    Hyperlipidemia    Hypertension    Obese    Sleep apnea    Past Surgical History:  Procedure Laterality Date   ANKLE SURGERY Left    APPENDECTOMY  1984   APPENDECTOMY     CATARACT EXTRACTION W/ INTRAOCULAR LENS  IMPLANT, BILATERAL  2014   CESAREAN SECTION  1984, 1987, 1994   X3   CESAREAN SECTION     x3   COLONOSCOPY  11/16/2013   w/Brodie    EXTERNAL FIXATION LEG Left 06/04/2023   Procedure: EXTERNAL FIXATION LEG;  Surgeon: Barton Drape, MD;  Location: [facility];  Service: Orthopedics;  Laterality: Left;   EYE SURGERY Bilateral    lasix   FOOT FRACTURE SURGERY Left    FOOT SURGERY Left 2002   FRACTURE SURGERY Left    left ankle   HARDWARE REMOVAL Left 12/03/2023   Procedure: HARDWARE REMOVAL OF SYNDEMOSIS;  Surgeon: Barton Drape, MD;  Location: [facility];  Service: Orthopedics;  Laterality: Left;   KNEE SURGERY Left 1998   ORIF ANKLE FRACTURE Left 06/04/2023   Procedure: OPEN REDUCTION INTERNAL FIXATION (ORIF) ANKLE FRACTURE vs EX FIX;  Surgeon: Barton Drape, MD;  Location: [facility];  Service: Orthopedics;  Laterality: Left;   ROTATOR CUFF REPAIR Right 2007   shoulder     torn rotator cuff   TUBAL LIGATION  1994   TUBAL LIGATION     Patient Active Problem List   Diagnosis Date Noted    Prediabetes 04/22/2024   History of thromboembolism 04/22/2024   OSA on CPAP 08/11/2023   Ankle fracture 06/03/2023   Trimalleolar fracture of left ankle 06/02/2023   Acute left ankle pain 06/02/2023   Sleep-related hypoxia 03/24/2023   At risk for obstructive sleep apnea 03/24/2023   Acute pulmonary embolism (HCC) 05/10/2022   Allergic rhinitis 03/15/2021   Gastro-esophageal reflux disease without esophagitis 03/15/2021   Mild persistent asthma, uncomplicated 03/15/2021   Aortic atherosclerosis (HCC) by Chest CT scan on 02/02/2018  01/10/2021   Asthma    Low back pain 03/17/2019   Lumbosacral spondylosis without myelopathy 03/17/2019   Pseudogout 12/09/2017   Major depressive disorder, recurrent, severe without psychotic features (HCC)    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   Suicide attempt by drug ingestion (HCC)    Vitamin D  deficiency 08/22/2014   Hyperlipidemia, mixed 08/22/2014   Hypertension    Anxiety    Obesity, unspecified     PCP: Elsie Richards MD  REFERRING PROVIDER: Barton Drape, MD (for ankle) Laqueta Sharper, MD (for low back)  REFERRING DIAG: S82.852D (ICD-10-CM) - Displaced trimalleolar fracture of left lower leg, subsequent encounter  for closed fracture with routine healing   THERAPY DIAG:  Pain in left ankle and joints of left foot  Stiffness of left ankle, not elsewhere classified  Muscle weakness (generalized)  Difficulty in walking, not elsewhere classified  Rationale for Evaluation and Treatment: Rehabilitation  ONSET DATE: July 2024  SUBJECTIVE:   SUBJECTIVE STATEMENT: Pt is 3 weeks s/p hardware removal.  Pt states she's doing well today.  She denies any adverse effects after prior Rx.  Pt sees MD this Friday.     is doing better since the hardware removal.  Pt reports she was a little sore after prior Rx.  Pt is ambulating without cane and feels good without her cane.  Pt states she doesn't feel her back and she thinks it is  probably due to her improved gait.  Pt reports she performed the gastroc stretch at home.    PERTINENT HISTORY: The patient is s/p left ankle trimalleolar & syndesmosis ORIF, DOS: 06/05/2023.  Removal of hardware on 04/26/24.  Removal of deep hardware on 12/03/2023.   Hx of foot/ankle surgery in 2022  Fibromyalgia, HTN, PE s/p covid in 2023 LBP with MRI (10/2022) evidence of chronic mild anterior wedge compression fracture of T12 and chronic anterior wedge compression deformity of L1   PAIN:  Are you having pain? Yes:  NPRS scale: 2-3/10  Pain location:  L ankle   0/10 Pain location: R sided lumbar    PRECAUTIONS: None   WEIGHT BEARING RESTRICTIONS: WBAT  FALLS:  Has patient fallen in last 6 months? Yes. Number of falls 1 fell down wet hill walking dog  LIVING ENVIRONMENT: Lives with: lives alone Lives in: House/apartment Stairs: Yes: External: 3 steps; none Has following equipment at home: Single point cane   OCCUPATION: retired   PLOF: Independent  PATIENT GOALS: walk normally and as pain free as able  NEXT MD VISIT: June 2025  OBJECTIVE:  Note: Objective measures were completed at Evaluation unless otherwise noted.  MRI IMPRESSION: 1. No acute findings or clear explanation for the patient's symptoms. Overall findings are similar to previous CT from 2020. 2. Interval development of a healed mild superior endplate compression deformity at L1 since 2020. No acute osseous findings. 3. Multilevel spondylosis as described, most advanced at L4-5 where there is mild spinal stenosis and asymmetric lateral recess and foraminal narrowing on the left with suspected chronic left L4 nerve root encroachment. 4. Mild asymmetric lateral recess and foraminal narrowing on the left at L5-S1 without definite nerve root encroachment.   LOWER EXTREMITY ROM:   Active ROM Right eval Left eval Left  11/17/23 Right 2/28 Left 2/28 AROM/PROM Left 4/21 AROM  L 5/12  L 6/19  L 6/30  Hip flexion                Hip extension                Hip abduction                Hip adduction                Hip internal rotation                Hip external rotation                Knee flexion                Knee extension  Ankle dorsiflexion   -8 Lacking 2 14 Lacking 7 deg / 3  with pain Lacking 1 degree/pain with AAROM  4 1 (calf tightness) 10deg  Ankle plantarflexion   30d 42 65 55 65deg 65 54 55  Ankle inversion   Limited by 50%   43 24 12 17 22 24   Ankle eversion   Limited by 50%   10 2 with pain 12 5 pain 11 15   (Blank rows = not tested)  LEFS 4/21: 14/80 LEFS: Lower Extremity Functional Score: 30 / 80 = 37.5 % ODI Score = 17 points (34%)  LEFS 6/19: 31/80 6/19 Modified Owsestry: 50%   MMT 4+ throughout L ankle with pain into EV MMT 6/19: 5/5 DF and PF, 4+/5 In,ev.  Hip flexion 11.1lbs R, 17.7, abduction 11.1R, 19L (seated)     TODAY'S TREATMENT:  7/14 Pt received L ankle DF, PF, Inv, and Eve PROM per pt and tissue tolerance.  Ankle ABC x 1 rep Ankle circles 2x10 cw, ccw Seated DF 2x10 Seated heel raises 2x10 Seated towel scrunches 3x10 Seated BAPS bilat F/B 2x10 Longsitting gastroc stretch 2x30 sec  Standing staggered stance rocks for pre gait training x 2 sets Standing weight shifts s/s x10  7/10 Pt received L ankle DF, PF, Inv, and Eve PROM per pt and tissue tolerance.  Ankle ABC x 1 rep Ankle pumps 2x10 Ankle circles 2x10 cw, ccw Seated towel scrunches 3x10 Seated BAPS bilat F/B 2x10 Seated Inv/Eve on half roll 2x10 Longsitting gastroc stretch 3x30 sec  Pt ambulated in the hallway without AD and has improved heel to toe gait. She has a slow gait and does favor L LE.     7/7 Reviewed current function, pain levels, and response to prior Rx.  Ankle AROM 4 way 2x10 reps each Ankle circles x 10 cw and ccw Seated towel scrunches 3x10 Pt received L ankle PROM in DF, PF, Inv, and Eve per pt and tissue  tolerance Standing staggered stance rocks for pre gait training Longsitting gastroc stretch 3x20 sec     7/2 Ankle AROM 4way x20ea Ankle ABC x2 Seated towel scrunch x30 Seated HR/TR x10 ea (began to feel uncomfortable) 1/2 roll AAROM in/ev 2x10, DF/PF x10 Seated adductor squeeze with TrA and cues for breathing Standing staggered stance rocks for pre gait training Gait training without SPC- cues for step length and heel contact/toe off    6/30 Updated ROM Ankle ABC x2 Seated towel scrunch x30 Seated HR/TR x30ea Standing staggered stance for pre gait training Gait training with and without SPC Nu-step 5' L4    6/19 Updated ROM and strength LEFS ODI Goal review R shot gun technique for SIJ  STM to R glutes Review of breathing techniques      PATIENT EDUCATION:  Education details:  exercise form, relevant anatomy Person educated: Patient Education method: Explanation, Demonstration, Tactile cues, Verbal cues Education comprehension: verbalized understanding, returned demonstration, verbal cues required, tactile cues required, and needs further education     HOME EXERCISE PROGRAM: Access Code: [email or web address] URL: [email or web address] Date: 02/27/2024 Prepared by: Gellen April Earnie Starring    ASSESSMENT:  CLINICAL IMPRESSION:  Pt is 2 weeks and 3 days s/p hardware removal and reports improved sx's since the hardware removal.  She reports improved mobility and states her back has not been bothering her.  Pt is ambulating without cane and feels good without her cane.  Pt performed exercises well without c/o's.  She tolerated treatment well and reports having  a little stiffness, but no pain after Rx.  Pt will benefit from continued skilled PT to address impairments and goals and to improve overall function.    Feels better, looser    OBJECTIVE IMPAIRMENTS: Abnormal gait, decreased activity tolerance, decreased balance, difficulty walking, decreased  ROM, decreased strength, obesity, and pain.   ACTIVITY LIMITATIONS: squatting, stairs, and locomotion level  PARTICIPATION LIMITATIONS: shopping and community activity  PERSONAL FACTORS: Past/current experiences and 1 comorbidity: back dysfunction are also affecting patient's functional outcome.   REHAB POTENTIAL: Good  CLINICAL DECISION MAKING: Evolving/moderate complexity  EVALUATION COMPLEXITY: Moderate   GOALS:  SHORT TERM GOALS: Target date: 11/24/23 Pt will tolerate full aquatic sessions consistently without increase in pain and with improving function to demonstrate good toleration and effectiveness of intervention.  Baseline: Goal status: Met 11/24/23  2.  Pt will navigate stair into and out of pool 6 steps using step through pattern Baseline: step-to pattern Goal status: In progress 11/10/23. Partially met 11/24/23  3.  Pt will report reduction in pain by 3 NPRS post aquatic session which lasts for up to 24 hours. Baseline:  Goal status: Met 11/24/23  4.  Pt will amb without antalgic gait pattern submerged in 3.6 ft Baseline:  Goal status:MET - 11/17/23  5.  Pt will demo improved quality of gait including increased toe off and stance time on L LE with reduced favoring of L LE. Goal status: ongoing Target date:  01/23/2024  6.  Pt will demo improved L ankle AROM to at least 0 deg in DF and 7 deg in Eversion for improved stiffness and gait.  Goal status: MET 6/19 Target date:  01/23/2024     LONG TERM GOALS: Target date: 06/28/2024    Pt will demonstrate an improvement in LEFS by at least 9 points for a clinically significant improvement in self perceived disability.  Baseline: 30/80 Goal status: MET  2.  Pt wiil navigate 1 flight of steps using step alternating pattern indep Baseline:  Goal status: ongoing  3.  Pt will demonstrate improved DF AROM to be w/n 5 deg of R ankle for improved stiffness, gait, and performance of stairs.  Baseline: see chart Goal  status: IN PROGRESS 4/21  4.  Pt will demo left ankle strength to be at least 4/5 in DF and WFL in PF (tested with manual resistance in open chain) in order for improved performance of and tolerance with functional mobility Baseline: see chart Goal status:  MET  5.  Pt will amb without AD without limitation to pain Baseline:using cane or walker  Goal status: IN PROGRESS (still using cane most   6.  Pt will have improved Oswestry score to >/= 43/50 to demo MCID Baseline: Oswestry Score: 21 / 50 or 42 % Goal status: ongoing (25/50 on 6/19)  7.  Pt will be able to tolerate standing tasks for >/=15 min for household chores/tasks Baseline: 2 min Goal status: ongoing 6/19  8.  Pt will be able to demo increased hip strength to at least 4/5 for improved standing tolerance Baseline: See MMT above Goal status: ongoing   PLAN:  PT FREQUENCY: 2x/wk  PT DURATION: 8 weeks     PLANNED INTERVENTIONS: [id number]- PT Re-evaluation, 97110-Therapeutic exercises, 97530- Therapeutic activity, 97112- Neuromuscular re-education, 97535- Self Care, [id number]- Manual therapy, [phone]- Gait training, [phone]- Orthotic Fit/training, [phone]- Aquatic Therapy, Patient/Family education, Balance training, Stair training, Taping, Dry Needling, Joint mobilization, DME instructions, Cryotherapy, and Moist heat  PLAN FOR NEXT SESSION:  Cont with ankle ROM.  Gait training.  Continue gross hip and core strengthening.   Leigh Minerva III PT, DPT 05/17/24 10:22 AM

## 2024-05-18 DIAGNOSIS — J301 Allergic rhinitis due to pollen: Secondary | ICD-10-CM | POA: Diagnosis not present

## 2024-05-18 DIAGNOSIS — F4310 Post-traumatic stress disorder, unspecified: Secondary | ICD-10-CM | POA: Diagnosis not present

## 2024-05-19 DIAGNOSIS — H43813 Vitreous degeneration, bilateral: Secondary | ICD-10-CM | POA: Diagnosis not present

## 2024-05-20 ENCOUNTER — Ambulatory Visit (HOSPITAL_BASED_OUTPATIENT_CLINIC_OR_DEPARTMENT_OTHER)

## 2024-05-20 ENCOUNTER — Encounter (HOSPITAL_BASED_OUTPATIENT_CLINIC_OR_DEPARTMENT_OTHER): Payer: Self-pay

## 2024-05-20 DIAGNOSIS — M25572 Pain in left ankle and joints of left foot: Secondary | ICD-10-CM | POA: Diagnosis not present

## 2024-05-20 DIAGNOSIS — M25672 Stiffness of left ankle, not elsewhere classified: Secondary | ICD-10-CM | POA: Diagnosis not present

## 2024-05-20 DIAGNOSIS — R262 Difficulty in walking, not elsewhere classified: Secondary | ICD-10-CM | POA: Diagnosis not present

## 2024-05-20 DIAGNOSIS — M6281 Muscle weakness (generalized): Secondary | ICD-10-CM | POA: Diagnosis not present

## 2024-05-20 DIAGNOSIS — M5459 Other low back pain: Secondary | ICD-10-CM

## 2024-05-20 NOTE — Therapy (Signed)
 OUTPATIENT PHYSICAL THERAPY LOWER EXTREMITY TREATMENT              Patient Name: Haley White MRN: [id number] DOB:[birth year], 68 y.o., female Today's Date: 05/20/2024  END OF SESSION: PT End of Session - 05/17/2024    Visit Number 34   Number of Visits 44   Date for PT Re-Evaluation 06/28/2024   Authorization Type [employer] MCR   PT Start Time 0940   PT Stop Time 1022   PT Time Calculation (min) 42 min   Activity Tolerance Patient tolerated treatment well   Behavior During Therapy  WFL for tasks assessed/performed             Past Medical History:  Diagnosis Date   Anxiety    Arthritis    Asthma    Cataract    Depression    Gout    Hyperlipidemia    Hypertension    Obese    Sleep apnea    Past Surgical History:  Procedure Laterality Date   ANKLE SURGERY Left    APPENDECTOMY  1984   APPENDECTOMY     CATARACT EXTRACTION W/ INTRAOCULAR LENS  IMPLANT, BILATERAL  2014   CESAREAN SECTION  1984, 1987, 1994   X3   CESAREAN SECTION     x3   COLONOSCOPY  11/16/2013   w/Brodie    EXTERNAL FIXATION LEG Left 06/04/2023   Procedure: EXTERNAL FIXATION LEG;  Surgeon: Barton Drape, MD;  Location: [facility];  Service: Orthopedics;  Laterality: Left;   EYE SURGERY Bilateral    lasix   FOOT FRACTURE SURGERY Left    FOOT SURGERY Left 2002   FRACTURE SURGERY Left    left ankle   HARDWARE REMOVAL Left 12/03/2023   Procedure: HARDWARE REMOVAL OF SYNDEMOSIS;  Surgeon: Barton Drape, MD;  Location: [facility];  Service: Orthopedics;  Laterality: Left;   KNEE SURGERY Left 1998   ORIF ANKLE FRACTURE Left 06/04/2023   Procedure: OPEN REDUCTION INTERNAL FIXATION (ORIF) ANKLE FRACTURE vs EX FIX;  Surgeon: Barton Drape, MD;  Location: [facility];  Service: Orthopedics;  Laterality: Left;   ROTATOR CUFF REPAIR Right 2007   shoulder     torn rotator cuff   TUBAL LIGATION  1994   TUBAL LIGATION     Patient Active Problem List   Diagnosis Date Noted    Prediabetes 04/22/2024   History of thromboembolism 04/22/2024   OSA on CPAP 08/11/2023   Ankle fracture 06/03/2023   Trimalleolar fracture of left ankle 06/02/2023   Acute left ankle pain 06/02/2023   Sleep-related hypoxia 03/24/2023   At risk for obstructive sleep apnea 03/24/2023   Acute pulmonary embolism (HCC) 05/10/2022   Allergic rhinitis 03/15/2021   Gastro-esophageal reflux disease without esophagitis 03/15/2021   Mild persistent asthma, uncomplicated 03/15/2021   Aortic atherosclerosis (HCC) by Chest CT scan on 02/02/2018  01/10/2021   Asthma    Low back pain 03/17/2019   Lumbosacral spondylosis without myelopathy 03/17/2019   Pseudogout 12/09/2017   Major depressive disorder, recurrent, severe without psychotic features (HCC)    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   Suicide attempt by drug ingestion (HCC)    Vitamin D  deficiency 08/22/2014   Hyperlipidemia, mixed 08/22/2014   Hypertension    Anxiety    Obesity, unspecified     PCP: Elsie Richards MD  REFERRING PROVIDER: Barton Drape, MD (for ankle) Laqueta Sharper, MD (for low back)  REFERRING DIAG: S82.852D (ICD-10-CM) - Displaced trimalleolar fracture of left lower leg, subsequent encounter  for closed fracture with routine healing   THERAPY DIAG:  Stiffness of left ankle, not elsewhere classified  Muscle weakness (generalized)  Difficulty in walking, not elsewhere classified  Other low back pain  Pain in left ankle and joints of left foot  Rationale for Evaluation and Treatment: Rehabilitation  ONSET DATE: July 2024  SUBJECTIVE:   SUBJECTIVE STATEMENT: Pt seeing MD for f/u and suture removal this Friday. Feels her back pain has improved with the way she has been walking now, though she still does have some pain. No back pain at rest, only mild pain with walking. No pain in ankle only mild soreness. Some lateral tightness as well.     PERTINENT HISTORY: The patient is s/p left ankle  trimalleolar & syndesmosis ORIF, DOS: 06/05/2023.  Removal of hardware on 04/26/24.  Removal of deep hardware on 12/03/2023.   Hx of foot/ankle surgery in 2022  Fibromyalgia, HTN, PE s/p covid in 2023 LBP with MRI (10/2022) evidence of chronic mild anterior wedge compression fracture of T12 and chronic anterior wedge compression deformity of L1   PAIN:  Are you having pain? Yes:  NPRS scale: 2-3/10  Pain location:  L ankle   0/10 Pain location: R sided lumbar    PRECAUTIONS: None   WEIGHT BEARING RESTRICTIONS: WBAT  FALLS:  Has patient fallen in last 6 months? Yes. Number of falls 1 fell down wet hill walking dog  LIVING ENVIRONMENT: Lives with: lives alone Lives in: House/apartment Stairs: Yes: External: 3 steps; none Has following equipment at home: Single point cane   OCCUPATION: retired   PLOF: Independent  PATIENT GOALS: walk normally and as pain free as able  NEXT MD VISIT: June 2025  OBJECTIVE:  Note: Objective measures were completed at Evaluation unless otherwise noted.  MRI IMPRESSION: 1. No acute findings or clear explanation for the patient's symptoms. Overall findings are similar to previous CT from 2020. 2. Interval development of a healed mild superior endplate compression deformity at L1 since 2020. No acute osseous findings. 3. Multilevel spondylosis as described, most advanced at L4-5 where there is mild spinal stenosis and asymmetric lateral recess and foraminal narrowing on the left with suspected chronic left L4 nerve root encroachment. 4. Mild asymmetric lateral recess and foraminal narrowing on the left at L5-S1 without definite nerve root encroachment.   LOWER EXTREMITY ROM:   Active ROM Right eval Left eval Left  11/17/23 Right 2/28 Left 2/28 AROM/PROM Left 4/21 AROM  L 5/12  L 6/19 L 6/30  Hip flexion                Hip extension                Hip abduction                Hip adduction                Hip internal rotation                 Hip external rotation                Knee flexion                Knee extension                Ankle dorsiflexion   -8 Lacking 2 14 Lacking 7 deg / 3  with pain Lacking 1 degree/pain with AAROM  4 1 (calf tightness) 10deg  Ankle  plantarflexion   30d 42 65 55 65deg 65 54 55  Ankle inversion   Limited by 50%   43 24 12 17 22 24   Ankle eversion   Limited by 50%   10 2 with pain 12 5 pain 11 15   (Blank rows = not tested)  LEFS 4/21: 14/80 LEFS: Lower Extremity Functional Score: 30 / 80 = 37.5 % ODI Score = 17 points (34%)  LEFS 6/19: 31/80 6/19 Modified Owsestry: 50%   MMT 4+ throughout L ankle with pain into EV MMT 6/19: 5/5 DF and PF, 4+/5 In,ev.  Hip flexion 11.1lbs R, 17.7, abduction 11.1R, 19L (seated)     TODAY'S TREATMENT:   7/17 Pt received L ankle  PROM STM to gastroc, peroneals, tibialis anterior  Ankle ABC x 1 rep AROM 4way x15ea Seated DF 2x10 Seated heel raises 2x10 Seated towel scrunches 3x10 1/2 roll AAROM x15ea   Standing staggered stance rocks for pre gait training x 2 sets Gait in hall x179ft Standing marching x5ea (stopped due to R hip pain).  SLS with UE support x20seconds  7/14 Pt received L ankle DF, PF, Inv, and Eve PROM per pt and tissue tolerance.  Ankle ABC x 1 rep Ankle circles 2x10 cw, ccw Seated DF 2x10 Seated heel raises 2x10 Seated towel scrunches 3x10 Seated BAPS bilat F/B 2x10 Longsitting gastroc stretch 2x30 sec  Standing staggered stance rocks for pre gait training x 2 sets Standing weight shifts s/s x10  7/10 Pt received L ankle DF, PF, Inv, and Eve PROM per pt and tissue tolerance.  Ankle ABC x 1 rep Ankle pumps 2x10 Ankle circles 2x10 cw, ccw Seated towel scrunches 3x10 Seated BAPS bilat F/B 2x10 Seated Inv/Eve on half roll 2x10 Longsitting gastroc stretch 3x30 sec  Pt ambulated in the hallway without AD and has improved heel to toe gait. She has a slow gait and does favor L LE.      7/7 Reviewed current function, pain levels, and response to prior Rx.  Ankle AROM 4 way 2x10 reps each Ankle circles x 10 cw and ccw Seated towel scrunches 3x10 Pt received L ankle PROM in DF, PF, Inv, and Eve per pt and tissue tolerance Standing staggered stance rocks for pre gait training Longsitting gastroc stretch 3x20 sec  PATIENT EDUCATION:  Education details:  exercise form, relevant anatomy, POC, and rationale of interventions. Person educated: Patient Education method: Explanation, Demonstration, Tactile cues, Verbal cues Education comprehension: verbalized understanding, returned demonstration, verbal cues required, tactile cues required, and needs further education     HOME EXERCISE PROGRAM: Access Code: [email or web address] URL: [email or web address] Date: 02/27/2024 Prepared by: Gellen April Earnie Starring    ASSESSMENT:  CLINICAL IMPRESSION:  Continued to work on ankle ROM and strengthening. Notable tightness/tenderness in tibialis anterior, peroneals, and gastroc/soleus mm so STM was performed gently to  improve this. Pt educated on how to perform this at home. Some R hip pain felt with standing marching, so discontinued this. She lacks single leg stability on L LE, requiring UE support in order to complete this. Will continue to progress as tolerated towards goals.     OBJECTIVE IMPAIRMENTS: Abnormal gait, decreased activity tolerance, decreased balance, difficulty walking, decreased ROM, decreased strength, obesity, and pain.   ACTIVITY LIMITATIONS: squatting, stairs, and locomotion level  PARTICIPATION LIMITATIONS: shopping and community activity  PERSONAL FACTORS: Past/current experiences and 1 comorbidity: back dysfunction are also affecting patient's functional outcome.   REHAB POTENTIAL: Good  CLINICAL DECISION MAKING: Evolving/moderate complexity  EVALUATION COMPLEXITY: Moderate   GOALS:  SHORT TERM GOALS: Target date: 11/24/23 Pt will  tolerate full aquatic sessions consistently without increase in pain and with improving function to demonstrate good toleration and effectiveness of intervention.  Baseline: Goal status: Met 11/24/23  2.  Pt will navigate stair into and out of pool 6 steps using step through pattern Baseline: step-to pattern Goal status: In progress 11/10/23. Partially met 11/24/23  3.  Pt will report reduction in pain by 3 NPRS post aquatic session which lasts for up to 24 hours. Baseline:  Goal status: Met 11/24/23  4.  Pt will amb without antalgic gait pattern submerged in 3.6 ft Baseline:  Goal status:MET - 11/17/23  5.  Pt will demo improved quality of gait including increased toe off and stance time on L LE with reduced favoring of L LE. Goal status: ongoing Target date:  01/23/2024  6.  Pt will demo improved L ankle AROM to at least 0 deg in DF and 7 deg in Eversion for improved stiffness and gait.  Goal status: MET 6/19 Target date:  01/23/2024     LONG TERM GOALS: Target date: 06/28/2024    Pt will demonstrate an improvement in LEFS by at least 9 points for a clinically significant improvement in self perceived disability.  Baseline: 30/80 Goal status: MET  2.  Pt wiil navigate 1 flight of steps using step alternating pattern indep Baseline:  Goal status: ongoing  3.  Pt will demonstrate improved DF AROM to be w/n 5 deg of R ankle for improved stiffness, gait, and performance of stairs.  Baseline: see chart Goal status: MET 7/17  4.  Pt will demo left ankle strength to be at least 4/5 in DF and WFL in PF (tested with manual resistance in open chain) in order for improved performance of and tolerance with functional mobility Baseline: see chart Goal status:  MET  5.  Pt will amb without AD without limitation to pain Baseline:using cane or walker  Goal status: MET 7/17  6.  Pt will have improved Oswestry score to >/= 43/50 to demo MCID Baseline: Oswestry Score: 21 / 50 or 42 % Goal  status: ongoing (25/50 on 6/19)  7.  Pt will be able to tolerate standing tasks for >/=15 min for household chores/tasks Baseline: 2 min Goal status: MET (max 7/17)  8.  Pt will be able to demo increased hip strength to at least 4/5 for improved standing tolerance Baseline: See MMT above Goal status: ongoing   PLAN:  PT FREQUENCY: 2x/wk  PT DURATION: 8 weeks     PLANNED INTERVENTIONS: 97164- PT Re-evaluation, 97110-Therapeutic exercises, 97530- Therapeutic activity, 97112- Neuromuscular re-education, 97535- Self Care, [id number]- Manual therapy, [phone]- Gait training, [phone]- Orthotic Fit/training, [phone]- Aquatic Therapy, Patient/Family education, Balance training, Stair training, Taping, Dry Needling, Joint mobilization, DME instructions, Cryotherapy, and Moist heat  PLAN FOR NEXT SESSION:  Cont with ankle ROM.  Gait training.  Continue gross hip and core strengthening. Pt sees MD this Friday.  Asberry Rodes, PTA  05/20/24 11:49 AM

## 2024-05-21 DIAGNOSIS — J3081 Allergic rhinitis due to animal (cat) (dog) hair and dander: Secondary | ICD-10-CM | POA: Diagnosis not present

## 2024-05-21 DIAGNOSIS — S82852D Displaced trimalleolar fracture of left lower leg, subsequent encounter for closed fracture with routine healing: Secondary | ICD-10-CM | POA: Diagnosis not present

## 2024-05-21 DIAGNOSIS — J3089 Other allergic rhinitis: Secondary | ICD-10-CM | POA: Diagnosis not present

## 2024-05-21 DIAGNOSIS — J301 Allergic rhinitis due to pollen: Secondary | ICD-10-CM | POA: Diagnosis not present

## 2024-05-23 NOTE — Therapy (Signed)
 OUTPATIENT PHYSICAL THERAPY LOWER EXTREMITY TREATMENT              Patient Name: Haley White MRN: [id number] DOB:1956/09/30, 68 y.o., female Today's Date: 05/24/2024  END OF SESSION: PT End of Session - 05/24/2024    Visit Number 36   Number of Visits 44   Date for PT Re-Evaluation 06/28/2024   Authorization Type [employer] MCR   PT Start Time 0935   PT Stop Time 1023   PT Time Calculation (min) 48 min   Activity Tolerance Patient tolerated treatment well   Behavior During Therapy  WFL for tasks assessed/performed             Past Medical History:  Diagnosis Date   Anxiety    Arthritis    Asthma    Cataract    Depression    Gout    Hyperlipidemia    Hypertension    Obese    Sleep apnea    Past Surgical History:  Procedure Laterality Date   ANKLE SURGERY Left    APPENDECTOMY  1984   APPENDECTOMY     CATARACT EXTRACTION W/ INTRAOCULAR LENS  IMPLANT, BILATERAL  2014   CESAREAN SECTION  1984, 1987, 1994   X3   CESAREAN SECTION     x3   COLONOSCOPY  11/16/2013   w/Brodie    EXTERNAL FIXATION LEG Left 06/04/2023   Procedure: EXTERNAL FIXATION LEG;  Surgeon: Barton Drape, MD;  Location: [facility];  Service: Orthopedics;  Laterality: Left;   EYE SURGERY Bilateral    lasix   FOOT FRACTURE SURGERY Left    FOOT SURGERY Left 2002   FRACTURE SURGERY Left    left ankle   HARDWARE REMOVAL Left 12/03/2023   Procedure: HARDWARE REMOVAL OF SYNDEMOSIS;  Surgeon: Barton Drape, MD;  Location: [facility];  Service: Orthopedics;  Laterality: Left;   KNEE SURGERY Left 1998   ORIF ANKLE FRACTURE Left 06/04/2023   Procedure: OPEN REDUCTION INTERNAL FIXATION (ORIF) ANKLE FRACTURE vs EX FIX;  Surgeon: Barton Drape, MD;  Location: [facility];  Service: Orthopedics;  Laterality: Left;   ROTATOR CUFF REPAIR Right 2007   shoulder     torn rotator cuff   TUBAL LIGATION  1994   TUBAL LIGATION     Patient Active Problem List   Diagnosis Date Noted    Prediabetes 04/22/2024   History of thromboembolism 04/22/2024   OSA on CPAP 08/11/2023   Ankle fracture 06/03/2023   Trimalleolar fracture of left ankle 06/02/2023   Acute left ankle pain 06/02/2023   Sleep-related hypoxia 03/24/2023   At risk for obstructive sleep apnea 03/24/2023   Acute pulmonary embolism (HCC) 05/10/2022   Allergic rhinitis 03/15/2021   Gastro-esophageal reflux disease without esophagitis 03/15/2021   Mild persistent asthma, uncomplicated 03/15/2021   Aortic atherosclerosis (HCC) by Chest CT scan on 02/02/2018  01/10/2021   Asthma    Low back pain 03/17/2019   Lumbosacral spondylosis without myelopathy 03/17/2019   Pseudogout 12/09/2017   Major depressive disorder, recurrent, severe without psychotic features (HCC)    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   Suicide attempt by drug ingestion (HCC)    Vitamin D  deficiency 08/22/2014   Hyperlipidemia, mixed 08/22/2014   Hypertension    Anxiety    Obesity, unspecified     PCP: Elsie Richards MD  REFERRING PROVIDER: Barton Drape, MD (for ankle) Laqueta Sharper, MD (for low back)  REFERRING DIAG: S82.852D (ICD-10-CM) - Displaced trimalleolar fracture of left lower leg, subsequent encounter  for closed fracture with routine healing   THERAPY DIAG:  Stiffness of left ankle, not elsewhere classified  Muscle weakness (generalized)  Difficulty in walking, not elsewhere classified  Other low back pain  Rationale for Evaluation and Treatment: Rehabilitation  ONSET DATE: July 2024  SUBJECTIVE:   SUBJECTIVE STATEMENT: Pt is 4 weeks s/p hardware removal.   Pt saw MD and her stitches removed.  Pt states MD stated her ankle looks good.  MD informed pt she could go swimming in a couple of weeks.  Pt states that her bump is tender on the dorsal surface of her foot.  She spoke to MD about it.  He informed her to schedule in 3 months and it may need to be removed. Pt states she is hurting today.  She had  difficulty sleeping last night due to her back.  Her back and ankle hurt worse today.  Pt reports the weather affects her.  Pt is seeing psychologist who thinks she has PTSD from her injury.      PERTINENT HISTORY: The patient is s/p left ankle trimalleolar & syndesmosis ORIF, DOS: 06/05/2023.  Removal of hardware on 04/26/24.  Removal of deep hardware on 12/03/2023.   Hx of foot/ankle surgery in 2022  Fibromyalgia, HTN, PE s/p covid in 2023 LBP with MRI (10/2022) evidence of chronic mild anterior wedge compression fracture of T12 and chronic anterior wedge compression deformity of L1   PAIN:  Are you having pain? Yes:  NPRS scale: 1-2/10 in sitting, 5/10 in standing  Pain location:  L ankle   5/10 Pain location: R sided lumbar    PRECAUTIONS: None   WEIGHT BEARING RESTRICTIONS: WBAT  FALLS:  Has patient fallen in last 6 months? Yes. Number of falls 1 fell down wet hill walking dog  LIVING ENVIRONMENT: Lives with: lives alone Lives in: House/apartment Stairs: Yes: External: 3 steps; none Has following equipment at home: Single point cane   OCCUPATION: retired   PLOF: Independent  PATIENT GOALS: walk normally and as pain free as able  NEXT MD VISIT: June 2025  OBJECTIVE:  Note: Objective measures were completed at Evaluation unless otherwise noted.  MRI IMPRESSION: 1. No acute findings or clear explanation for the patient's symptoms. Overall findings are similar to previous CT from 2020. 2. Interval development of a healed mild superior endplate compression deformity at L1 since 2020. No acute osseous findings. 3. Multilevel spondylosis as described, most advanced at L4-5 where there is mild spinal stenosis and asymmetric lateral recess and foraminal narrowing on the left with suspected chronic left L4 nerve root encroachment. 4. Mild asymmetric lateral recess and foraminal narrowing on the left at L5-S1 without definite nerve root encroachment.   LOWER  EXTREMITY ROM:   Active ROM Right eval Left eval Left  11/17/23 Right 2/28 Left 2/28 AROM/PROM Left 4/21 AROM  L 5/12  L 6/19 L 6/30  Hip flexion                Hip extension                Hip abduction                Hip adduction                Hip internal rotation                Hip external rotation  Knee flexion                Knee extension                Ankle dorsiflexion   -8 Lacking 2 14 Lacking 7 deg / 3  with pain Lacking 1 degree/pain with AAROM  4 1 (calf tightness) 10deg  Ankle plantarflexion   30d 42 65 55 65deg 65 54 55  Ankle inversion   Limited by 50%   43 24 12 17 22 24   Ankle eversion   Limited by 50%   10 2 with pain 12 5 pain 11 15   (Blank rows = not tested)  LEFS 4/21: 14/80 LEFS: Lower Extremity Functional Score: 30 / 80 = 37.5 % ODI Score = 17 points (34%)  LEFS 6/19: 31/80 6/19 Modified Owsestry: 50%   MMT 4+ throughout L ankle with pain into EV MMT 6/19: 5/5 DF and PF, 4+/5 In,ev.  Hip flexion 11.1lbs R, 17.7, abduction 11.1R, 19L (seated)     TODAY'S TREATMENT:   7/21 Pt received L ankle DF, PF, Inv, and Eve PROM per pt and tissue tolerance.  Ankle ABC x 1 rep Seated BAPS bilat F/B 2x10, attempted cw, ccw Eversion/Inversion on half roll 2x10 Seated DF 2x10 Seated heel raises 2x10  Heel to toe rocking with UE support on rail Tandem stance 2x30 sec each LE, 1x20 sec with R foot back Standing on airex with FT x 30 sec  Longsitting gastroc stretch 3x30 sec  Pt received STM to R sided lumbar paraspinals in prone.  PT educated pt in the theracane and demonstrated using the theracane to lumbar paraspinals.   7/17 Pt received L ankle  PROM STM to gastroc, peroneals, tibialis anterior  Ankle ABC x 1 rep AROM 4way x15ea Seated DF 2x10 Seated heel raises 2x10 Seated towel scrunches 3x10 1/2 roll AAROM x15ea   Standing staggered stance rocks for pre gait training x 2 sets Gait in hall x155ft Standing  marching x5ea (stopped due to R hip pain).  SLS with UE support x20seconds  7/14 Pt received L ankle DF, PF, Inv, and Eve PROM per pt and tissue tolerance.  Ankle ABC x 1 rep Ankle circles 2x10 cw, ccw Seated DF 2x10 Seated heel raises 2x10 Seated towel scrunches 3x10 Seated BAPS bilat F/B 2x10 Longsitting gastroc stretch 2x30 sec  Standing staggered stance rocks for pre gait training x 2 sets Standing weight shifts s/s x10  7/10 Pt received L ankle DF, PF, Inv, and Eve PROM per pt and tissue tolerance.  Ankle ABC x 1 rep Ankle pumps 2x10 Ankle circles 2x10 cw, ccw Seated towel scrunches 3x10 Seated BAPS bilat F/B 2x10 Seated Inv/Eve on half roll 2x10 Longsitting gastroc stretch 3x30 sec  Pt ambulated in the hallway without AD and has improved heel to toe gait. She has a slow gait and does favor L LE.     7/7 Reviewed current function, pain levels, and response to prior Rx.  Ankle AROM 4 way 2x10 reps each Ankle circles x 10 cw and ccw Seated towel scrunches 3x10 Pt received L ankle PROM in DF, PF, Inv, and Eve per pt and tissue tolerance Standing staggered stance rocks for pre gait training Longsitting gastroc stretch 3x20 sec  PATIENT EDUCATION:  Education details:  exercise form, relevant anatomy, POC, and rationale of interventions. Person educated: Patient Education method: Explanation, Demonstration, Tactile cues, Verbal cues Education comprehension: verbalized understanding, returned demonstration, verbal cues required, tactile  cues required, and needs further education     HOME EXERCISE PROGRAM: Access Code: [email or web address] URL: [email or web address] Date: 02/27/2024 Prepared by: Gellen April Earnie Starring  Updated HEP: - Long Sitting Calf Stretch with Strap  - 2 x daily - 7 x weekly - 2-3 reps - 20 seconds- 30 seconds hold    ASSESSMENT:  CLINICAL IMPRESSION:  Pt presents to Rx stating her back and ankle hurt worse today and reports that  the weather affects her pain.  Pt is progressing with ankle ROM.  She had pain with seated BAPS circles and PT had pt stop that exercise.  Pt able to perform DF/PF on BAPS board without c/o's.  PT instructed pt to not perform ankle exercises into a painful range.  Pt was limited with neuro re-ed activities due to her back pain.  Pt had back pain with tandem stance and standing on airex with FT.  Pt states the longsitting gastroc stretch feels good.  PT performed STM to R sided lumbar paraspinals to improve soft tissue tightness and mobility and pain and to reduce myofascial restrictions and spasm.  Pt was very tender to palpation in R sided lumbar paraspinals.  PT performed STM gently and pt's tolerance improved.  PT also showed pt the theracane and demonstrated it.  Pt used it briefly and states she liked it.  Pt reports her back felt better after STM and at the end of treatment though her ankle felt more tender.  Pt should benefit from cont skilled PT to address impairments and ongoing goals and to assist in restoring desired level of function.      OBJECTIVE IMPAIRMENTS: Abnormal gait, decreased activity tolerance, decreased balance, difficulty walking, decreased ROM, decreased strength, obesity, and pain.   ACTIVITY LIMITATIONS: squatting, stairs, and locomotion level  PARTICIPATION LIMITATIONS: shopping and community activity  PERSONAL FACTORS: Past/current experiences and 1 comorbidity: back dysfunction are also affecting patient's functional outcome.   REHAB POTENTIAL: Good  CLINICAL DECISION MAKING: Evolving/moderate complexity  EVALUATION COMPLEXITY: Moderate   GOALS:  SHORT TERM GOALS: Target date: 11/24/23 Pt will tolerate full aquatic sessions consistently without increase in pain and with improving function to demonstrate good toleration and effectiveness of intervention.  Baseline: Goal status: Met 11/24/23  2.  Pt will navigate stair into and out of pool 6 steps using step  through pattern Baseline: step-to pattern Goal status: In progress 11/10/23. Partially met 11/24/23  3.  Pt will report reduction in pain by 3 NPRS post aquatic session which lasts for up to 24 hours. Baseline:  Goal status: Met 11/24/23  4.  Pt will amb without antalgic gait pattern submerged in 3.6 ft Baseline:  Goal status:MET - 11/17/23  5.  Pt will demo improved quality of gait including increased toe off and stance time on L LE with reduced favoring of L LE. Goal status: ongoing Target date:  01/23/2024  6.  Pt will demo improved L ankle AROM to at least 0 deg in DF and 7 deg in Eversion for improved stiffness and gait.  Goal status: MET 6/19 Target date:  01/23/2024     LONG TERM GOALS: Target date: 06/28/2024    Pt will demonstrate an improvement in LEFS by at least 9 points for a clinically significant improvement in self perceived disability.  Baseline: 30/80 Goal status: MET  2.  Pt wiil navigate 1 flight of steps using step alternating pattern indep Baseline:  Goal status: ongoing  3.  Pt will demonstrate improved DF AROM  to be w/n 5 deg of R ankle for improved stiffness, gait, and performance of stairs.  Baseline: see chart Goal status: MET 7/17  4.  Pt will demo left ankle strength to be at least 4/5 in DF and WFL in PF (tested with manual resistance in open chain) in order for improved performance of and tolerance with functional mobility Baseline: see chart Goal status:  MET  5.  Pt will amb without AD without limitation to pain Baseline:using cane or walker  Goal status: MET 7/17  6.  Pt will have improved Oswestry score to >/= 43/50 to demo MCID Baseline: Oswestry Score: 21 / 50 or 42 % Goal status: ongoing (25/50 on 6/19)  7.  Pt will be able to tolerate standing tasks for >/=15 min for household chores/tasks Baseline: 2 min Goal status: MET (max 7/17)  8.  Pt will be able to demo increased hip strength to at least 4/5 for improved standing  tolerance Baseline: See MMT above Goal status: ongoing   PLAN:  PT FREQUENCY: 2x/wk  PT DURATION: 8 weeks     PLANNED INTERVENTIONS: [id number]- PT Re-evaluation, 97110-Therapeutic exercises, 97530- Therapeutic activity, 97112- Neuromuscular re-education, 97535- Self Care, [id number]- Manual therapy, [phone]- Gait training, [phone]- Orthotic Fit/training, [phone]- Aquatic Therapy, Patient/Family education, Balance training, Stair training, Taping, Dry Needling, Joint mobilization, DME instructions, Cryotherapy, and Moist heat  PLAN FOR NEXT SESSION:  Cont with ankle ROM.  Gait training.  Continue gross hip and core strengthening. Pt sees MD this Friday.  Soft tissue work to lumbar paraspinals.  Leigh Minerva III PT, DPT 05/24/24 2:02 PM

## 2024-05-24 ENCOUNTER — Ambulatory Visit (HOSPITAL_BASED_OUTPATIENT_CLINIC_OR_DEPARTMENT_OTHER): Admitting: Physical Therapy

## 2024-05-24 ENCOUNTER — Encounter (HOSPITAL_BASED_OUTPATIENT_CLINIC_OR_DEPARTMENT_OTHER): Payer: Self-pay | Admitting: Physical Therapy

## 2024-05-24 DIAGNOSIS — M25672 Stiffness of left ankle, not elsewhere classified: Secondary | ICD-10-CM | POA: Diagnosis not present

## 2024-05-24 DIAGNOSIS — M5459 Other low back pain: Secondary | ICD-10-CM | POA: Diagnosis not present

## 2024-05-24 DIAGNOSIS — R262 Difficulty in walking, not elsewhere classified: Secondary | ICD-10-CM | POA: Diagnosis not present

## 2024-05-24 DIAGNOSIS — M6281 Muscle weakness (generalized): Secondary | ICD-10-CM

## 2024-05-24 DIAGNOSIS — M25572 Pain in left ankle and joints of left foot: Secondary | ICD-10-CM | POA: Diagnosis not present

## 2024-05-26 DIAGNOSIS — J3089 Other allergic rhinitis: Secondary | ICD-10-CM | POA: Diagnosis not present

## 2024-05-26 DIAGNOSIS — J301 Allergic rhinitis due to pollen: Secondary | ICD-10-CM | POA: Diagnosis not present

## 2024-05-26 DIAGNOSIS — J3081 Allergic rhinitis due to animal (cat) (dog) hair and dander: Secondary | ICD-10-CM | POA: Diagnosis not present

## 2024-05-27 ENCOUNTER — Ambulatory Visit (HOSPITAL_BASED_OUTPATIENT_CLINIC_OR_DEPARTMENT_OTHER): Admitting: Physical Therapy

## 2024-05-27 ENCOUNTER — Encounter (HOSPITAL_BASED_OUTPATIENT_CLINIC_OR_DEPARTMENT_OTHER): Payer: Self-pay | Admitting: Physical Therapy

## 2024-05-27 DIAGNOSIS — M5459 Other low back pain: Secondary | ICD-10-CM

## 2024-05-27 DIAGNOSIS — R262 Difficulty in walking, not elsewhere classified: Secondary | ICD-10-CM

## 2024-05-27 DIAGNOSIS — M6281 Muscle weakness (generalized): Secondary | ICD-10-CM

## 2024-05-27 DIAGNOSIS — M25672 Stiffness of left ankle, not elsewhere classified: Secondary | ICD-10-CM | POA: Diagnosis not present

## 2024-05-27 DIAGNOSIS — M25572 Pain in left ankle and joints of left foot: Secondary | ICD-10-CM | POA: Diagnosis not present

## 2024-05-27 NOTE — Therapy (Signed)
 OUTPATIENT PHYSICAL THERAPY LOWER EXTREMITY TREATMENT              Patient Name: Haley White MRN: [id number] DOB:[birth year], 69 y.o., female Today's Date: 05/28/2024  END OF SESSION: PT End of Session - 05/27/2024    Visit Number 37   Number of Visits 44   Date for PT Re-Evaluation 06/28/2024   Authorization Type [employer] MCR   PT Start Time 0945   PT Stop Time 1027   PT Time Calculation (min) 42 min   Activity Tolerance Patient tolerated treatment well   Behavior During Therapy  WFL for tasks assessed/performed             Past Medical History:  Diagnosis Date   Anxiety    Arthritis    Asthma    Cataract    Depression    Gout    Hyperlipidemia    Hypertension    Obese    Sleep apnea    Past Surgical History:  Procedure Laterality Date   ANKLE SURGERY Left    APPENDECTOMY  1984   APPENDECTOMY     CATARACT EXTRACTION W/ INTRAOCULAR LENS  IMPLANT, BILATERAL  2014   CESAREAN SECTION  1984, 1987, 1994   X3   CESAREAN SECTION     x3   COLONOSCOPY  11/16/2013   w/Brodie    EXTERNAL FIXATION LEG Left 06/04/2023   Procedure: EXTERNAL FIXATION LEG;  Surgeon: Barton Drape, MD;  Location: [facility];  Service: Orthopedics;  Laterality: Left;   EYE SURGERY Bilateral    lasix   FOOT FRACTURE SURGERY Left    FOOT SURGERY Left 2002   FRACTURE SURGERY Left    left ankle   HARDWARE REMOVAL Left 12/03/2023   Procedure: HARDWARE REMOVAL OF SYNDEMOSIS;  Surgeon: Barton Drape, MD;  Location: [facility];  Service: Orthopedics;  Laterality: Left;   KNEE SURGERY Left 1998   ORIF ANKLE FRACTURE Left 06/04/2023   Procedure: OPEN REDUCTION INTERNAL FIXATION (ORIF) ANKLE FRACTURE vs EX FIX;  Surgeon: Barton Drape, MD;  Location: [facility];  Service: Orthopedics;  Laterality: Left;   ROTATOR CUFF REPAIR Right 2007   shoulder     torn rotator cuff   TUBAL LIGATION  1994   TUBAL LIGATION     Patient Active Problem List   Diagnosis Date Noted    Prediabetes 04/22/2024   History of thromboembolism 04/22/2024   OSA on CPAP 08/11/2023   Ankle fracture 06/03/2023   Trimalleolar fracture of left ankle 06/02/2023   Acute left ankle pain 06/02/2023   Sleep-related hypoxia 03/24/2023   At risk for obstructive sleep apnea 03/24/2023   Acute pulmonary embolism (HCC) 05/10/2022   Allergic rhinitis 03/15/2021   Gastro-esophageal reflux disease without esophagitis 03/15/2021   Mild persistent asthma, uncomplicated 03/15/2021   Aortic atherosclerosis (HCC) by Chest CT scan on 02/02/2018  01/10/2021   Asthma    Low back pain 03/17/2019   Lumbosacral spondylosis without myelopathy 03/17/2019   Pseudogout 12/09/2017   Major depressive disorder, recurrent, severe without psychotic features (HCC)    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   Suicide attempt by drug ingestion (HCC)    Vitamin D  deficiency 08/22/2014   Hyperlipidemia, mixed 08/22/2014   Hypertension    Anxiety    Obesity, unspecified     PCP: Elsie Richards MD  REFERRING PROVIDER: Barton Drape, MD (for ankle) Laqueta Sharper, MD (for low back)  REFERRING DIAG: S82.852D (ICD-10-CM) - Displaced trimalleolar fracture of left lower leg, subsequent encounter  for closed fracture with routine healing   THERAPY DIAG:  Stiffness of left ankle, not elsewhere classified  Muscle weakness (generalized)  Difficulty in walking, not elsewhere classified  Other low back pain  Rationale for Evaluation and Treatment: Rehabilitation  ONSET DATE: July 2024  SUBJECTIVE:   SUBJECTIVE STATEMENT: Pt is 4 weeks and 3 days s/p hardware removal.   Pt states she is hurting today, not sure if fibromyalgia is flaring.  Pt states her whole foot hurts.  Pt denies any adverse effects after prior Rx.     PERTINENT HISTORY: The patient is s/p left ankle trimalleolar & syndesmosis ORIF, DOS: 06/05/2023.  Removal of hardware on 04/26/24.  Removal of deep hardware on 12/03/2023.   Hx of  foot/ankle surgery in 2022  Fibromyalgia, HTN, PE s/p covid in 2023 LBP with MRI (10/2022) evidence of chronic mild anterior wedge compression fracture of T12 and chronic anterior wedge compression deformity of L1   PAIN:  Are you having pain? Yes:  NPRS scale: 4/10 in sitting  Pain location:  L ankle and foot   4/10 Pain location: R sided lumbar    PRECAUTIONS: None   WEIGHT BEARING RESTRICTIONS: WBAT  FALLS:  Has patient fallen in last 6 months? Yes. Number of falls 1 fell down wet hill walking dog  LIVING ENVIRONMENT: Lives with: lives alone Lives in: House/apartment Stairs: Yes: External: 3 steps; none Has following equipment at home: Single point cane   OCCUPATION: retired   PLOF: Independent  PATIENT GOALS: walk normally and as pain free as able  NEXT MD VISIT: June 2025  OBJECTIVE:  Note: Objective measures were completed at Evaluation unless otherwise noted.  MRI IMPRESSION: 1. No acute findings or clear explanation for the patient's symptoms. Overall findings are similar to previous CT from 2020. 2. Interval development of a healed mild superior endplate compression deformity at L1 since 2020. No acute osseous findings. 3. Multilevel spondylosis as described, most advanced at L4-5 where there is mild spinal stenosis and asymmetric lateral recess and foraminal narrowing on the left with suspected chronic left L4 nerve root encroachment. 4. Mild asymmetric lateral recess and foraminal narrowing on the left at L5-S1 without definite nerve root encroachment.   LOWER EXTREMITY ROM:   Active ROM Right eval Left eval Left  11/17/23 Right 2/28 Left 2/28 AROM/PROM Left 4/21 AROM  L 5/12  L 6/19 L 6/30  Hip flexion                Hip extension                Hip abduction                Hip adduction                Hip internal rotation                Hip external rotation                Knee flexion                Knee extension                 Ankle dorsiflexion   -8 Lacking 2 14 Lacking 7 deg / 3  with pain Lacking 1 degree/pain with AAROM  4 1 (calf tightness) 10deg  Ankle plantarflexion   30d 42 65 55 65deg 65 54 55  Ankle inversion   Limited by 50%  43 24 12 17 22 24   Ankle eversion   Limited by 50%   10 2 with pain 12 5 pain 11 15   (Blank rows = not tested)  LEFS 4/21: 14/80 LEFS: Lower Extremity Functional Score: 30 / 80 = 37.5 % ODI Score = 17 points (34%)  LEFS 6/19: 31/80 6/19 Modified Owsestry: 50%   MMT 4+ throughout L ankle with pain into EV MMT 6/19: 5/5 DF and PF, 4+/5 In,ev.  Hip flexion 11.1lbs R, 17.7, abduction 11.1R, 19L (seated)     TODAY'S TREATMENT:   7/24 Pt received L ankle DF, PF, Inv, and Eve PROM per pt and tissue tolerance.  Ankle ABC x 1 rep Seated BAPS bilat F/B, cw, and ccw 2x10 each Seated DF 2x10 Seated heel raises 2x10 Standing on airex with FT 2x30 sec Attempted staggered stance rocks though pt had pain Longsitting gastroc stretch 3x30 sec  Pt received STM to R sided lumbar paraspinals in prone.    7/21 Pt received L ankle DF, PF, Inv, and Eve PROM per pt and tissue tolerance.  Ankle ABC x 1 rep Seated BAPS bilat F/B 2x10, attempted cw, ccw Eversion/Inversion on half roll 2x10 Seated DF 2x10 Seated heel raises 2x10  Heel to toe rocking with UE support on rail Tandem stance 2x30 sec each LE, 1x20 sec with R foot back Standing on airex with FT x 30 sec  Longsitting gastroc stretch 3x30 sec  Pt received STM to R sided lumbar paraspinals in prone.  PT educated pt in the theracane and demonstrated using the theracane to lumbar paraspinals.   7/17 Pt received L ankle  PROM STM to gastroc, peroneals, tibialis anterior  Ankle ABC x 1 rep AROM 4way x15ea Seated DF 2x10 Seated heel raises 2x10 Seated towel scrunches 3x10 1/2 roll AAROM x15ea   Standing staggered stance rocks for pre gait training x 2 sets Gait in hall x149ft Standing marching x5ea (stopped due  to R hip pain).  SLS with UE support x20seconds  7/14 Pt received L ankle DF, PF, Inv, and Eve PROM per pt and tissue tolerance.  Ankle ABC x 1 rep Ankle circles 2x10 cw, ccw Seated DF 2x10 Seated heel raises 2x10 Seated towel scrunches 3x10 Seated BAPS bilat F/B 2x10 Longsitting gastroc stretch 2x30 sec  Standing staggered stance rocks for pre gait training x 2 sets Standing weight shifts s/s x10  7/10 Pt received L ankle DF, PF, Inv, and Eve PROM per pt and tissue tolerance.  Ankle ABC x 1 rep Ankle pumps 2x10 Ankle circles 2x10 cw, ccw Seated towel scrunches 3x10 Seated BAPS bilat F/B 2x10 Seated Inv/Eve on half roll 2x10 Longsitting gastroc stretch 3x30 sec  Pt ambulated in the hallway without AD and has improved heel to toe gait. She has a slow gait and does favor L LE.   PATIENT EDUCATION:  Education details:  exercise form, relevant anatomy, POC, and rationale of interventions. Person educated: Patient Education method: Explanation, Demonstration, Tactile cues, Verbal cues Education comprehension: verbalized understanding, returned demonstration, verbal cues required, tactile cues required, and needs further education     HOME EXERCISE PROGRAM: Access Code: [email or web address] URL: [email or web address] Date: 02/27/2024 Prepared by: Gellen April Earnie Starring     ASSESSMENT:  CLINICAL IMPRESSION:    Pt presents to Rx stating she is hurting today.  Pt performed ankle ROM exercises well and tolerated ankle PROM well.  PT attempted standing staggered rocks though stopped due to pain.  PT performed gentle STM  to R sided lumbar paraspinals to improve soft tissue tightness and mobility and pain and to reduce myofascial restrictions and spasm.  Pt was very sensitive with palpation/STM to upper lumbar paraspinals on R side though had no tenderness on L side.  Pt states her back feels better after STM and reports improved pain both in back and ankle/foot to 1-2/10 and  2-3/10 respectively.  Pt may benefit from cupping lumbar paraspinals.  Pt should benefit from cont skilled PT to address impairments and ongoing goals and to assist in restoring desired level of function.         OBJECTIVE IMPAIRMENTS: Abnormal gait, decreased activity tolerance, decreased balance, difficulty walking, decreased ROM, decreased strength, obesity, and pain.   ACTIVITY LIMITATIONS: squatting, stairs, and locomotion level  PARTICIPATION LIMITATIONS: shopping and community activity  PERSONAL FACTORS: Past/current experiences and 1 comorbidity: back dysfunction are also affecting patient's functional outcome.   REHAB POTENTIAL: Good  CLINICAL DECISION MAKING: Evolving/moderate complexity  EVALUATION COMPLEXITY: Moderate   GOALS:  SHORT TERM GOALS: Target date: 11/24/23 Pt will tolerate full aquatic sessions consistently without increase in pain and with improving function to demonstrate good toleration and effectiveness of intervention.  Baseline: Goal status: Met 11/24/23  2.  Pt will navigate stair into and out of pool 6 steps using step through pattern Baseline: step-to pattern Goal status: In progress 11/10/23. Partially met 11/24/23  3.  Pt will report reduction in pain by 3 NPRS post aquatic session which lasts for up to 24 hours. Baseline:  Goal status: Met 11/24/23  4.  Pt will amb without antalgic gait pattern submerged in 3.6 ft Baseline:  Goal status:MET - 11/17/23  5.  Pt will demo improved quality of gait including increased toe off and stance time on L LE with reduced favoring of L LE. Goal status: ongoing Target date:  01/23/2024  6.  Pt will demo improved L ankle AROM to at least 0 deg in DF and 7 deg in Eversion for improved stiffness and gait.  Goal status: MET 6/19 Target date:  01/23/2024     LONG TERM GOALS: Target date: 06/28/2024    Pt will demonstrate an improvement in LEFS by at least 9 points for a clinically significant improvement in  self perceived disability.  Baseline: 30/80 Goal status: MET  2.  Pt wiil navigate 1 flight of steps using step alternating pattern indep Baseline:  Goal status: ongoing  3.  Pt will demonstrate improved DF AROM to be w/n 5 deg of R ankle for improved stiffness, gait, and performance of stairs.  Baseline: see chart Goal status: MET 7/17  4.  Pt will demo left ankle strength to be at least 4/5 in DF and WFL in PF (tested with manual resistance in open chain) in order for improved performance of and tolerance with functional mobility Baseline: see chart Goal status:  MET  5.  Pt will amb without AD without limitation to pain Baseline:using cane or walker  Goal status: MET 7/17  6.  Pt will have improved Oswestry score to >/= 43/50 to demo MCID Baseline: Oswestry Score: 21 / 50 or 42 % Goal status: ongoing (25/50 on 6/19)  7.  Pt will be able to tolerate standing tasks for >/=15 min for household chores/tasks Baseline: 2 min Goal status: MET (max 7/17)  8.  Pt will be able to demo increased hip strength to at least 4/5 for improved standing tolerance Baseline: See MMT above Goal status: ongoing  PLAN:  PT FREQUENCY: 2x/wk  PT DURATION: 8 weeks     PLANNED INTERVENTIONS: [id number]- PT Re-evaluation, 97110-Therapeutic exercises, 97530- Therapeutic activity, [id number]- Neuromuscular re-education, 97535- Self Care, [id number]- Manual therapy, [phone]- Gait training, [phone]- Orthotic Fit/training, [phone]- Aquatic Therapy, Patient/Family education, Balance training, Stair training, Taping, Dry Needling, Joint mobilization, DME instructions, Cryotherapy, and Moist heat  PLAN FOR NEXT SESSION:  Cont with ankle ROM.  Gait training.  Continue gross hip and core strengthening. Soft tissue work to lumbar paraspinals.  Consider cupping to lumbar paraspinals.  Leigh Minerva III PT, DPT 05/28/24 6:44 AM

## 2024-05-28 DIAGNOSIS — J3081 Allergic rhinitis due to animal (cat) (dog) hair and dander: Secondary | ICD-10-CM | POA: Diagnosis not present

## 2024-05-28 DIAGNOSIS — J3089 Other allergic rhinitis: Secondary | ICD-10-CM | POA: Diagnosis not present

## 2024-05-28 DIAGNOSIS — J301 Allergic rhinitis due to pollen: Secondary | ICD-10-CM | POA: Diagnosis not present

## 2024-05-31 ENCOUNTER — Encounter: Payer: Self-pay | Admitting: Plastic Surgery

## 2024-05-31 ENCOUNTER — Ambulatory Visit (HOSPITAL_BASED_OUTPATIENT_CLINIC_OR_DEPARTMENT_OTHER): Admitting: Physical Therapy

## 2024-05-31 ENCOUNTER — Encounter (HOSPITAL_BASED_OUTPATIENT_CLINIC_OR_DEPARTMENT_OTHER): Payer: Self-pay | Admitting: Physical Therapy

## 2024-05-31 ENCOUNTER — Ambulatory Visit: Admitting: Plastic Surgery

## 2024-05-31 VITALS — BP 143/81 | HR 93 | Ht 62.0 in | Wt 242.8 lb

## 2024-05-31 DIAGNOSIS — M6281 Muscle weakness (generalized): Secondary | ICD-10-CM

## 2024-05-31 DIAGNOSIS — R262 Difficulty in walking, not elsewhere classified: Secondary | ICD-10-CM | POA: Diagnosis not present

## 2024-05-31 DIAGNOSIS — M542 Cervicalgia: Secondary | ICD-10-CM | POA: Diagnosis not present

## 2024-05-31 DIAGNOSIS — N62 Hypertrophy of breast: Secondary | ICD-10-CM | POA: Diagnosis not present

## 2024-05-31 DIAGNOSIS — G8929 Other chronic pain: Secondary | ICD-10-CM

## 2024-05-31 DIAGNOSIS — J3081 Allergic rhinitis due to animal (cat) (dog) hair and dander: Secondary | ICD-10-CM | POA: Diagnosis not present

## 2024-05-31 DIAGNOSIS — J3089 Other allergic rhinitis: Secondary | ICD-10-CM | POA: Diagnosis not present

## 2024-05-31 DIAGNOSIS — Z853 Personal history of malignant neoplasm of breast: Secondary | ICD-10-CM

## 2024-05-31 DIAGNOSIS — I2699 Other pulmonary embolism without acute cor pulmonale: Secondary | ICD-10-CM

## 2024-05-31 DIAGNOSIS — Z6841 Body Mass Index (BMI) 40.0 and over, adult: Secondary | ICD-10-CM

## 2024-05-31 DIAGNOSIS — M546 Pain in thoracic spine: Secondary | ICD-10-CM | POA: Diagnosis not present

## 2024-05-31 DIAGNOSIS — M25672 Stiffness of left ankle, not elsewhere classified: Secondary | ICD-10-CM | POA: Diagnosis not present

## 2024-05-31 DIAGNOSIS — M5459 Other low back pain: Secondary | ICD-10-CM

## 2024-05-31 DIAGNOSIS — F332 Major depressive disorder, recurrent severe without psychotic features: Secondary | ICD-10-CM

## 2024-05-31 DIAGNOSIS — J301 Allergic rhinitis due to pollen: Secondary | ICD-10-CM | POA: Diagnosis not present

## 2024-05-31 DIAGNOSIS — M25572 Pain in left ankle and joints of left foot: Secondary | ICD-10-CM | POA: Diagnosis not present

## 2024-05-31 DIAGNOSIS — M47817 Spondylosis without myelopathy or radiculopathy, lumbosacral region: Secondary | ICD-10-CM

## 2024-05-31 NOTE — Progress Notes (Addendum)
 Patient ID: Haley White, female    DOB: June 01, 1956, 68 y.o.   MRN: [id number]   Chief Complaint  Patient presents with  . Advice Only    Mammary Hyperplasia: The patient is a 68 y.o. female with a history of mammary hyperplasia for several years.  She has extremely large breasts causing symptoms that include the following: Back pain in the upper and lower back, including neck pain. She pulls or pins her bra straps to provide better lift and relief of the pressure and pain. She notices relief by holding her breast up manually.  Her shoulder straps cause grooves and pain and pressure that requires padding for relief. Pain medication is sometimes required with motrin  and tylenol .  Activities that are hindered by enlarged breasts include: exercise and running.  She has tried supportive clothing as well as fitted bras without improvement.  Her breasts are extremely large and fairly symmetric.  She has hyperpigmentation of the inframammary area on both sides.  The sternal to nipple distance on the right is 32 cm and the left is 33 cm.  The IMF distance is 15 cm.  She is 5 feet 2 inches tall and weighs 242 pounds.  The BMI = 44.3 kg/m.  Preoperative bra size = E cup.  The estimated excess breast tissue to be removed at the time of surgery = 750-800 grams on the left and 750-800 grams on the right.  Mammogram history: due.  Family history of breast cancer:  mother.  Tobacco use:  no.   The patient expresses the desire to pursue surgical intervention. The patient had ankle fracture surgery, c-section and an appendectomy. She has been seen by ortho for back pain as well. She is currently in PT for the pain.     Review of Systems  Constitutional:  Positive for activity change.  HENT: Negative.    Eyes: Negative.   Respiratory: Negative.    Cardiovascular: Negative.   Gastrointestinal: Negative.   Endocrine: Negative.   Genitourinary: Negative.   Musculoskeletal:  Positive for back pain and neck  pain.  Skin:  Positive for rash.  Hematological: Negative.     Past Medical History:  Diagnosis Date  . Anxiety   . Arthritis   . Asthma   . Cataract   . Depression   . Gout   . Hyperlipidemia   . Hypertension   . Obese   . Sleep apnea     Past Surgical History:  Procedure Laterality Date  . ANKLE SURGERY Left   . APPENDECTOMY  1984  . APPENDECTOMY    . CATARACT EXTRACTION W/ INTRAOCULAR LENS  IMPLANT, BILATERAL  2014  . CESAREAN SECTION  1984, 1987, 1994   X3  . CESAREAN SECTION     x3  . COLONOSCOPY  11/16/2013   w/Brodie   . EXTERNAL FIXATION LEG Left 06/04/2023   Procedure: EXTERNAL FIXATION LEG;  Surgeon: Barton Drape, MD;  Location: [facility];  Service: Orthopedics;  Laterality: Left;  . EYE SURGERY Bilateral    lasix  . FOOT FRACTURE SURGERY Left   . FOOT SURGERY Left 2002  . FRACTURE SURGERY Left    left ankle  . HARDWARE REMOVAL Left 12/03/2023   Procedure: HARDWARE REMOVAL OF SYNDEMOSIS;  Surgeon: Barton Drape, MD;  Location: [facility];  Service: Orthopedics;  Laterality: Left;  . KNEE SURGERY Left 1998  . ORIF ANKLE FRACTURE Left 06/04/2023   Procedure: OPEN REDUCTION INTERNAL FIXATION (ORIF) ANKLE FRACTURE  vs EX FIX;  Surgeon: Barton Drape, MD;  Location: [facility];  Service: Orthopedics;  Laterality: Left;  . ROTATOR CUFF REPAIR Right 2007  . shoulder     torn rotator cuff  . TUBAL LIGATION  1994  . TUBAL LIGATION        Current Outpatient Medications:  .  albuterol  (VENTOLIN  HFA) 108 (90 Base) MCG/ACT inhaler, Inhale 2 puffs into the lungs every 6 (six) hours as needed for wheezing or shortness of breath., Disp: , Rfl:  .  azelastine  (ASTELIN ) 0.1 % nasal spray, Place 1 spray into both nostrils 2 (two) times daily. Use in each nostril as directed, Disp: 30 mL, Rfl: 12 .  budesonide  (PULMICORT ) 0.5 MG/2ML nebulizer solution, Take 2 mLs (0.5 mg total) by nebulization daily as needed., Disp: 2 mL, Rfl: 2 .  buPROPion   (WELLBUTRIN  XL) 300 MG 24 hr tablet, Take 300 mg by mouth daily., Disp: , Rfl:  .  cetirizine  (ZYRTEC  ALLERGY ) 10 MG tablet, Take 1 tablet (10 mg total) by mouth daily., Disp: 90 tablet, Rfl: 3 .  EPINEPHrine  0.3 mg/0.3 mL IJ SOAJ injection, See admin instructions., Disp: , Rfl:  .  FLUoxetine  (PROZAC ) 20 MG capsule, Take 60 mg by mouth daily., Disp: , Rfl:  .  fluticasone  (FLONASE) 50 MCG/ACT nasal spray, Place 1 spray into both nostrils daily as needed for allergies or rhinitis., Disp: , Rfl:  .  lisinopril  (ZESTRIL ) 20 MG tablet, Take 2 tablets (40 mg total) by mouth daily., Disp: 90 tablet, Rfl: 0 .  methylphenidate  (RITALIN ) 10 MG tablet, Take 10 mg by mouth 2 (two) times daily., Disp: , Rfl: 0 .  montelukast  (SINGULAIR ) 10 MG tablet, Take 10 mg by mouth at bedtime., Disp: , Rfl:  .  NON FORMULARY, Inject 1 Dose into the skin once a week. Allergy  shots from [facility]  at [place]. Patient not sure about the name of medicine, Disp: , Rfl:  .  pantoprazole  (PROTONIX ) 40 MG tablet, TAKE 1 TABLET BY MOUTH DAILY TO PREVENT INDIGESTION AND HEARTBURN, Disp: 90 tablet, Rfl: 3 .  rosuvastatin  (CRESTOR ) 40 MG tablet, Take  1 tablet Daily for Cholesterol            \    TAKE  BY MOUTH, Disp: 90 tablet, Rfl: 3 .  SYMBICORT 160-4.5 MCG/ACT inhaler, Inhale 2 puffs into the lungs 2 (two) times daily., Disp: , Rfl: 6 .  traZODone  (DESYREL ) 50 MG tablet, Take 1 tablet (50 mg total) by mouth at bedtime., Disp: 60 tablet, Rfl: 5 .  VITAMIN D  PO, Take 10,000 Units by mouth daily., Disp: , Rfl:  .  fexofenadine (ALLEGRA) 180 MG tablet, Take 180 mg by mouth daily as needed for allergies., Disp: , Rfl:   Current Facility-Administered Medications:  .  ipratropium-albuterol  (DUONEB) 0.5-2.5 (3) MG/3ML nebulizer solution 3 mL, 3 mL, Nebulization, Once,    Objective:   Vitals:   05/31/24 0834  BP: (!) 143/81  Pulse: 93  SpO2: 97%    Physical Exam Vitals reviewed.  Constitutional:      Appearance:  Normal appearance.  HENT:     Head: Normocephalic and atraumatic.  Cardiovascular:     Rate and Rhythm: Normal rate.     Pulses: Normal pulses.  Pulmonary:     Effort: Pulmonary effort is normal.  Abdominal:     Palpations: Abdomen is soft.  Skin:    General: Skin is warm.     Capillary Refill: Capillary refill takes less  than 2 seconds.  Neurological:     Mental Status: She is alert and oriented to person, place, and time.  Psychiatric:        Mood and Affect: Mood normal.        Behavior: Behavior normal.        Thought Content: Thought content normal.        Judgment: Judgment normal.     Assessment & Plan:  Macromastia - Plan: MM 3D SCREENING MAMMOGRAM BILATERAL BREAST  Major depressive disorder, recurrent, severe without psychotic features (HCC)  Class 3 severe obesity with serious comorbidity and body mass index (BMI) of 40.0 to 44.9 in adult, unspecified obesity type  Lumbosacral spondylosis without myelopathy  Acute pulmonary embolism, unspecified pulmonary embolism type, unspecified whether acute cor pulmonale present (HCC)  Chronic bilateral thoracic back pain  Neck pain  Symptomatic mammary hypertrophy  The procedure the patient selected and that was best for the patient was discussed. The risk were discussed and include but not limited to the following:  Breast asymmetry, fluid accumulation, firmness of the breast, inability to breast feed, loss of nipple or areola, skin loss, change in skin and nipple sensation, fat necrosis of the breast tissue, bleeding, infection and healing delay.  There are risks of anesthesia and injury to nerves or blood vessels.  Allergic reaction to tape, suture and skin glue are possible.  There will be swelling.  Any of these can lead to the need for revisional surgery which is not included in this surgery.  A breast reduction has potential to interfere with diagnostic procedures in the future.  This procedure is best done when the  breast is fully developed.  Changes in the breast will continue to occur over time: pregnancy, weight gain or weigh loss. No guarantees are given for a certain bra or breast size.    Total time: 40 minutes. This includes time spent with the patient during the visit as well as time spent before and after the visit reviewing the chart, documenting the encounter, ordering pertinent studies and literature for the patient.   Physical therapy:  currently doing PT Mammogram:  ordered [facility]:  joining their team now.  The patient is a good candidate for bilateral breast reduction with liposuction.  She will let us  know when she finishes physical therapy and come back and see us .  If all remains the same we will submit her for preauthorization.  Pictures were obtained of the patient and placed in the chart with the patient's or guardian's permission.   Estefana RAMAN Shon Indelicato, DO

## 2024-05-31 NOTE — Therapy (Signed)
 OUTPATIENT PHYSICAL THERAPY LOWER EXTREMITY TREATMENT              Patient Name: Haley White MRN: [id number] DOB:01-08-1956, 68 y.o., female Today's Date: 06/01/2024  END OF SESSION: PT End of Session - 05/31/2024    Visit Number 38   Number of Visits 44   Date for PT Re-Evaluation 06/28/2024   Authorization Type [employer] MCR   PT Start Time 9061   PT Stop Time 1022   PT Time Calculation (min) 44 min   Activity Tolerance Patient tolerated treatment well   Behavior During Therapy  WFL for tasks assessed/performed             Past Medical History:  Diagnosis Date   Anxiety    Arthritis    Asthma    Cataract    Depression    Gout    Hyperlipidemia    Hypertension    Obese    Sleep apnea    Past Surgical History:  Procedure Laterality Date   ANKLE SURGERY Left    APPENDECTOMY  1984   APPENDECTOMY     CATARACT EXTRACTION W/ INTRAOCULAR LENS  IMPLANT, BILATERAL  2014   CESAREAN SECTION  1984, 1987, 1994   X3   CESAREAN SECTION     x3   COLONOSCOPY  11/16/2013   w/Brodie    EXTERNAL FIXATION LEG Left 06/04/2023   Procedure: EXTERNAL FIXATION LEG;  Surgeon: Barton Drape, MD;  Location: [facility];  Service: Orthopedics;  Laterality: Left;   EYE SURGERY Bilateral    lasix   FOOT FRACTURE SURGERY Left    FOOT SURGERY Left 2002   FRACTURE SURGERY Left    left ankle   HARDWARE REMOVAL Left 12/03/2023   Procedure: HARDWARE REMOVAL OF SYNDEMOSIS;  Surgeon: Barton Drape, MD;  Location: [facility];  Service: Orthopedics;  Laterality: Left;   KNEE SURGERY Left 1998   ORIF ANKLE FRACTURE Left 06/04/2023   Procedure: OPEN REDUCTION INTERNAL FIXATION (ORIF) ANKLE FRACTURE vs EX FIX;  Surgeon: Barton Drape, MD;  Location: [facility];  Service: Orthopedics;  Laterality: Left;   ROTATOR CUFF REPAIR Right 2007   shoulder     torn rotator cuff   TUBAL LIGATION  1994   TUBAL LIGATION     Patient Active Problem List   Diagnosis Date Noted    Neck pain 05/31/2024   Symptomatic mammary hypertrophy 05/31/2024   Prediabetes 04/22/2024   History of thromboembolism 04/22/2024   OSA on CPAP 08/11/2023   Ankle fracture 06/03/2023   Trimalleolar fracture of left ankle 06/02/2023   Acute left ankle pain 06/02/2023   Sleep-related hypoxia 03/24/2023   At risk for obstructive sleep apnea 03/24/2023   Acute pulmonary embolism (HCC) 05/10/2022   Allergic rhinitis 03/15/2021   Gastro-esophageal reflux disease without esophagitis 03/15/2021   Mild persistent asthma, uncomplicated 03/15/2021   Aortic atherosclerosis (HCC) by Chest CT scan on 02/02/2018  01/10/2021   Asthma    Back pain 03/17/2019   Lumbosacral spondylosis without myelopathy 03/17/2019   Pseudogout 12/09/2017   Major depressive disorder, recurrent, severe without psychotic features (HCC)    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   Suicide attempt by drug ingestion (HCC)    Vitamin D  deficiency 08/22/2014   Hyperlipidemia, mixed 08/22/2014   Hypertension    Anxiety    Obesity, unspecified     PCP: Elsie Richards MD  REFERRING PROVIDER: Barton Drape, MD (for ankle) Laqueta Sharper, MD (for low back)  REFERRING DIAG: D17.147I (ICD-10-CM) -  Displaced trimalleolar fracture of left lower leg, subsequent encounter for closed fracture with routine healing   THERAPY DIAG:  Stiffness of left ankle, not elsewhere classified  Muscle weakness (generalized)  Difficulty in walking, not elsewhere classified  Other low back pain  Rationale for Evaluation and Treatment: Rehabilitation  ONSET DATE: July 2024  SUBJECTIVE:   SUBJECTIVE STATEMENT: Pt is 5 weeks s/p hardware removal.   Pt states her back and ankle were a little irritated after prior Rx.  Pt had an appt this AM and is planning on having a breast reduction.      PERTINENT HISTORY: The patient is s/p left ankle trimalleolar & syndesmosis ORIF, DOS: 06/05/2023.  Removal of hardware on 04/26/24.   Removal of deep hardware on 12/03/2023.   Hx of foot/ankle surgery in 2022  Fibromyalgia, HTN, PE s/p covid in 2023 LBP with MRI (10/2022) evidence of chronic mild anterior wedge compression fracture of T12 and chronic anterior wedge compression deformity of L1   PAIN:  Are you having pain? Yes:  NPRS scale: 3-4/10 in sitting  Pain location:  L ankle and foot   4-5/10 Pain location:  Central and R sided lumbar    PRECAUTIONS: None   WEIGHT BEARING RESTRICTIONS: WBAT  FALLS:  Has patient fallen in last 6 months? Yes. Number of falls 1 fell down wet hill walking dog  LIVING ENVIRONMENT: Lives with: lives alone Lives in: House/apartment Stairs: Yes: External: 3 steps; none Has following equipment at home: Single point cane   OCCUPATION: retired   PLOF: Independent  PATIENT GOALS: walk normally and as pain free as able  NEXT MD VISIT: June 2025  OBJECTIVE:  Note: Objective measures were completed at Evaluation unless otherwise noted.  MRI IMPRESSION: 1. No acute findings or clear explanation for the patient's symptoms. Overall findings are similar to previous CT from 2020. 2. Interval development of a healed mild superior endplate compression deformity at L1 since 2020. No acute osseous findings. 3. Multilevel spondylosis as described, most advanced at L4-5 where there is mild spinal stenosis and asymmetric lateral recess and foraminal narrowing on the left with suspected chronic left L4 nerve root encroachment. 4. Mild asymmetric lateral recess and foraminal narrowing on the left at L5-S1 without definite nerve root encroachment.   LOWER EXTREMITY ROM:   Active ROM Right eval Left eval Left  11/17/23 Right 2/28 Left 2/28 AROM/PROM Left 4/21 AROM  L 5/12  L 6/19 L 6/30  Hip flexion                Hip extension                Hip abduction                Hip adduction                Hip internal rotation                Hip external rotation                 Knee flexion                Knee extension                Ankle dorsiflexion   -8 Lacking 2 14 Lacking 7 deg / 3  with pain Lacking 1 degree/pain with AAROM  4 1 (calf tightness) 10deg  Ankle plantarflexion   30d 42 65 55 65deg 65  54 55  Ankle inversion   Limited by 50%   43 24 12 17 22 24   Ankle eversion   Limited by 50%   10 2 with pain 12 5 pain 11 15   (Blank rows = not tested)  LEFS 4/21: 14/80 LEFS: Lower Extremity Functional Score: 30 / 80 = 37.5 % ODI Score = 17 points (34%)  LEFS 6/19: 31/80 6/19 Modified Owsestry: 50%   MMT 4+ throughout L ankle with pain into EV MMT 6/19: 5/5 DF and PF, 4+/5 In,ev.  Hip flexion 11.1lbs R, 17.7, abduction 11.1R, 19L (seated)     TODAY'S TREATMENT:   7/28  Manual Therapy:  STM to R sided lumbar paraspinals f/b cupping to R sided lumbar paraspinals in prone  Seated DF 2x10 Seated heel raises 2x10 Seated BAPS bilat F/B, cw, and ccw 2x10 each Standing on airex x 30 sec, 2x30 sec with FT Weight shifts s/s on airex x 10 reps F/B weight shifts Attempted standing gastroc stretch at rail though stopped due to pain Low level march x10 reps  7/24 Pt received L ankle DF, PF, Inv, and Eve PROM per pt and tissue tolerance.  Ankle ABC x 1 rep Seated BAPS bilat F/B, cw, and ccw 2x10 each Seated DF 2x10 Seated heel raises 2x10 Standing on airex with FT 2x30 sec Attempted staggered stance rocks though pt had pain Longsitting gastroc stretch 3x30 sec  Pt received STM to R sided lumbar paraspinals in prone.    7/21 Pt received L ankle DF, PF, Inv, and Eve PROM per pt and tissue tolerance.  Ankle ABC x 1 rep Seated BAPS bilat F/B 2x10, attempted cw, ccw Eversion/Inversion on half roll 2x10 Seated DF 2x10 Seated heel raises 2x10  Heel to toe rocking with UE support on rail Tandem stance 2x30 sec each LE, 1x20 sec with R foot back Standing on airex with FT x 30 sec  Longsitting gastroc stretch 3x30 sec  Pt received STM to R  sided lumbar paraspinals in prone.  PT educated pt in the theracane and demonstrated using the theracane to lumbar paraspinals.   7/17 Pt received L ankle  PROM STM to gastroc, peroneals, tibialis anterior  Ankle ABC x 1 rep AROM 4way x15ea Seated DF 2x10 Seated heel raises 2x10 Seated towel scrunches 3x10 1/2 roll AAROM x15ea   Standing staggered stance rocks for pre gait training x 2 sets Gait in hall x166ft Standing marching x5ea (stopped due to R hip pain).  SLS with UE support x20seconds  7/14 Pt received L ankle DF, PF, Inv, and Eve PROM per pt and tissue tolerance.  Ankle ABC x 1 rep Ankle circles 2x10 cw, ccw Seated DF 2x10 Seated heel raises 2x10 Seated towel scrunches 3x10 Seated BAPS bilat F/B 2x10 Longsitting gastroc stretch 2x30 sec  Standing staggered stance rocks for pre gait training x 2 sets Standing weight shifts s/s x10  7/10 Pt received L ankle DF, PF, Inv, and Eve PROM per pt and tissue tolerance.  Ankle ABC x 1 rep Ankle pumps 2x10 Ankle circles 2x10 cw, ccw Seated towel scrunches 3x10 Seated BAPS bilat F/B 2x10 Seated Inv/Eve on half roll 2x10 Longsitting gastroc stretch 3x30 sec  Pt ambulated in the hallway without AD and has improved heel to toe gait. She has a slow gait and does favor L LE.   PATIENT EDUCATION:  Education details:  exercise form, relevant anatomy, POC, and rationale of interventions. Person educated: Patient Education method: Explanation, Demonstration, Tactile  cues, Verbal cues Education comprehension: verbalized understanding, returned demonstration, verbal cues required, tactile cues required, and needs further education     HOME EXERCISE PROGRAM: Access Code: [email or web address] URL: [email or web address] Date: 02/27/2024 Prepared by: Gellen April Earnie Starring     ASSESSMENT:  CLINICAL IMPRESSION:    Pt is still sensitive at R sided upper lumbar region though is better today.  PT performed cupping today  and pt had an appropriate response.  Pt states her back feels better after manual therapy.  PT worked on [employer] including weight shifting today and she tolerated it well.  Pt was unable to perform standing gastroc stretch due to pain.  Pt responded well to Rx stating her back felt better after Rx.  Pt should benefit from cont skilled PT to address impairments and ongoing goals and to assist in restoring desired level of function.         OBJECTIVE IMPAIRMENTS: Abnormal gait, decreased activity tolerance, decreased balance, difficulty walking, decreased ROM, decreased strength, obesity, and pain.   ACTIVITY LIMITATIONS: squatting, stairs, and locomotion level  PARTICIPATION LIMITATIONS: shopping and community activity  PERSONAL FACTORS: Past/current experiences and 1 comorbidity: back dysfunction are also affecting patient's functional outcome.   REHAB POTENTIAL: Good  CLINICAL DECISION MAKING: Evolving/moderate complexity  EVALUATION COMPLEXITY: Moderate   GOALS:  SHORT TERM GOALS: Target date: 11/24/23 Pt will tolerate full aquatic sessions consistently without increase in pain and with improving function to demonstrate good toleration and effectiveness of intervention.  Baseline: Goal status: Met 11/24/23  2.  Pt will navigate stair into and out of pool 6 steps using step through pattern Baseline: step-to pattern Goal status: In progress 11/10/23. Partially met 11/24/23  3.  Pt will report reduction in pain by 3 NPRS post aquatic session which lasts for up to 24 hours. Baseline:  Goal status: Met 11/24/23  4.  Pt will amb without antalgic gait pattern submerged in 3.6 ft Baseline:  Goal status:MET - 11/17/23  5.  Pt will demo improved quality of gait including increased toe off and stance time on L LE with reduced favoring of L LE. Goal status: ongoing Target date:  01/23/2024  6.  Pt will demo improved L ankle AROM to at least 0 deg in DF and 7 deg in Eversion for  improved stiffness and gait.  Goal status: MET 6/19 Target date:  01/23/2024     LONG TERM GOALS: Target date: 06/28/2024    Pt will demonstrate an improvement in LEFS by at least 9 points for a clinically significant improvement in self perceived disability.  Baseline: 30/80 Goal status: MET  2.  Pt wiil navigate 1 flight of steps using step alternating pattern indep Baseline:  Goal status: ongoing  3.  Pt will demonstrate improved DF AROM to be w/n 5 deg of R ankle for improved stiffness, gait, and performance of stairs.  Baseline: see chart Goal status: MET 7/17  4.  Pt will demo left ankle strength to be at least 4/5 in DF and WFL in PF (tested with manual resistance in open chain) in order for improved performance of and tolerance with functional mobility Baseline: see chart Goal status:  MET  5.  Pt will amb without AD without limitation to pain Baseline:using cane or walker  Goal status: MET 7/17  6.  Pt will have improved Oswestry score to >/= 43/50 to demo MCID Baseline: Oswestry Score: 21 / 50 or 42 % Goal status: ongoing (25/50 on 6/19)  7.  Pt will be able to tolerate standing tasks for >/=15 min for household chores/tasks Baseline: 2 min Goal status: MET (max 7/17)  8.  Pt will be able to demo increased hip strength to at least 4/5 for improved standing tolerance Baseline: See MMT above Goal status: ongoing   PLAN:  PT FREQUENCY: 2x/wk  PT DURATION: 8 weeks     PLANNED INTERVENTIONS: [id number]- PT Re-evaluation, 97110-Therapeutic exercises, 97530- Therapeutic activity, 97112- Neuromuscular re-education, 97535- Self Care, [id number]- Manual therapy, [phone]- Gait training, [phone]- Orthotic Fit/training, [phone]- Aquatic Therapy, Patient/Family education, Balance training, Stair training, Taping, Dry Needling, Joint mobilization, DME instructions, Cryotherapy, and Moist heat  PLAN FOR NEXT SESSION:  Cont with ankle ROM.  Gait training.  Continue gross hip and core  strengthening. Soft tissue work to lumbar paraspinals.  Cont cupping to lumbar paraspinals.  Leigh Minerva III PT, DPT 06/01/24 4:12 PM

## 2024-06-01 ENCOUNTER — Encounter (HOSPITAL_BASED_OUTPATIENT_CLINIC_OR_DEPARTMENT_OTHER): Payer: Self-pay | Admitting: Physical Therapy

## 2024-06-01 DIAGNOSIS — F4310 Post-traumatic stress disorder, unspecified: Secondary | ICD-10-CM | POA: Diagnosis not present

## 2024-06-01 NOTE — Telephone Encounter (Signed)
 I spoke with the patient in regards to her questions she sent. Patient conveyed understanding.

## 2024-06-02 ENCOUNTER — Telehealth: Payer: Self-pay | Admitting: Plastic Surgery

## 2024-06-02 DIAGNOSIS — J3081 Allergic rhinitis due to animal (cat) (dog) hair and dander: Secondary | ICD-10-CM | POA: Diagnosis not present

## 2024-06-02 DIAGNOSIS — J3089 Other allergic rhinitis: Secondary | ICD-10-CM | POA: Diagnosis not present

## 2024-06-02 DIAGNOSIS — J301 Allergic rhinitis due to pollen: Secondary | ICD-10-CM | POA: Diagnosis not present

## 2024-06-02 DIAGNOSIS — H04123 Dry eye syndrome of bilateral lacrimal glands: Secondary | ICD-10-CM | POA: Diagnosis not present

## 2024-06-02 NOTE — Telephone Encounter (Signed)
 Pt to let us  know she is trying to est with a primary, she is looking at using a NP for PCP, she will call us  when she is est so we can send a clerarance

## 2024-06-03 ENCOUNTER — Encounter (HOSPITAL_BASED_OUTPATIENT_CLINIC_OR_DEPARTMENT_OTHER): Payer: Self-pay | Admitting: Physical Therapy

## 2024-06-03 ENCOUNTER — Ambulatory Visit (HOSPITAL_BASED_OUTPATIENT_CLINIC_OR_DEPARTMENT_OTHER): Admitting: Physical Therapy

## 2024-06-03 DIAGNOSIS — M25672 Stiffness of left ankle, not elsewhere classified: Secondary | ICD-10-CM | POA: Diagnosis not present

## 2024-06-03 DIAGNOSIS — R262 Difficulty in walking, not elsewhere classified: Secondary | ICD-10-CM | POA: Diagnosis not present

## 2024-06-03 DIAGNOSIS — M5459 Other low back pain: Secondary | ICD-10-CM

## 2024-06-03 DIAGNOSIS — M25572 Pain in left ankle and joints of left foot: Secondary | ICD-10-CM | POA: Diagnosis not present

## 2024-06-03 DIAGNOSIS — M6281 Muscle weakness (generalized): Secondary | ICD-10-CM

## 2024-06-03 NOTE — Therapy (Signed)
 OUTPATIENT PHYSICAL THERAPY LOWER EXTREMITY TREATMENT              Patient Name: Haley White MRN: [id number] DOB:1956-08-21, 68 y.o., female Today's Date: 06/03/2024  END OF SESSION: PT End of Session - 06/03/2024    Visit Number 39   Number of Visits 44   Date for PT Re-Evaluation 06/28/2024   Authorization Type [employer] MCR   PT Start Time 9061   PT Stop Time 1024   PT Time Calculation (min) 46 min   Activity Tolerance Patient tolerated treatment well   Behavior During Therapy  WFL for tasks assessed/performed             Past Medical History:  Diagnosis Date   Anxiety    Arthritis    Asthma    Cataract    Depression    Gout    Hyperlipidemia    Hypertension    Obese    Sleep apnea    Past Surgical History:  Procedure Laterality Date   ANKLE SURGERY Left    APPENDECTOMY  1984   APPENDECTOMY     CATARACT EXTRACTION W/ INTRAOCULAR LENS  IMPLANT, BILATERAL  2014   CESAREAN SECTION  1984, 1987, 1994   X3   CESAREAN SECTION     x3   COLONOSCOPY  11/16/2013   w/Brodie    EXTERNAL FIXATION LEG Left 06/04/2023   Procedure: EXTERNAL FIXATION LEG;  Surgeon: Barton Drape, MD;  Location: [facility];  Service: Orthopedics;  Laterality: Left;   EYE SURGERY Bilateral    lasix   FOOT FRACTURE SURGERY Left    FOOT SURGERY Left 2002   FRACTURE SURGERY Left    left ankle   HARDWARE REMOVAL Left 12/03/2023   Procedure: HARDWARE REMOVAL OF SYNDEMOSIS;  Surgeon: Barton Drape, MD;  Location: [facility];  Service: Orthopedics;  Laterality: Left;   KNEE SURGERY Left 1998   ORIF ANKLE FRACTURE Left 06/04/2023   Procedure: OPEN REDUCTION INTERNAL FIXATION (ORIF) ANKLE FRACTURE vs EX FIX;  Surgeon: Barton Drape, MD;  Location: [facility];  Service: Orthopedics;  Laterality: Left;   ROTATOR CUFF REPAIR Right 2007   shoulder     torn rotator cuff   TUBAL LIGATION  1994   TUBAL LIGATION     Patient Active Problem List   Diagnosis Date Noted    Neck pain 05/31/2024   Symptomatic mammary hypertrophy 05/31/2024   Prediabetes 04/22/2024   History of thromboembolism 04/22/2024   OSA on CPAP 08/11/2023   Ankle fracture 06/03/2023   Trimalleolar fracture of left ankle 06/02/2023   Acute left ankle pain 06/02/2023   Sleep-related hypoxia 03/24/2023   At risk for obstructive sleep apnea 03/24/2023   Acute pulmonary embolism (HCC) 05/10/2022   Allergic rhinitis 03/15/2021   Gastro-esophageal reflux disease without esophagitis 03/15/2021   Mild persistent asthma, uncomplicated 03/15/2021   Aortic atherosclerosis (HCC) by Chest CT scan on 02/02/2018  01/10/2021   Asthma    Back pain 03/17/2019   Lumbosacral spondylosis without myelopathy 03/17/2019   Pseudogout 12/09/2017   Major depressive disorder, recurrent, severe without psychotic features (HCC)    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   Suicide attempt by drug ingestion (HCC)    Vitamin D  deficiency 08/22/2014   Hyperlipidemia, mixed 08/22/2014   Hypertension    Anxiety    Obesity, unspecified     PCP: Elsie Richards MD  REFERRING PROVIDER: Barton Drape, MD (for ankle) Laqueta Sharper, MD (for low back)  REFERRING DIAG: D17.147I (ICD-10-CM) -  Displaced trimalleolar fracture of left lower leg, subsequent encounter for closed fracture with routine healing   THERAPY DIAG:  Stiffness of left ankle, not elsewhere classified  Muscle weakness (generalized)  Difficulty in walking, not elsewhere classified  Other low back pain  Pain in left ankle and joints of left foot  Rationale for Evaluation and Treatment: Rehabilitation  ONSET DATE: July 2024  SUBJECTIVE:   SUBJECTIVE STATEMENT: Pt is 5 weeks and 3 days s/p hardware removal.   Pt states she is not doing good today, and the weather front is coming in.  Pt states her back hurts more than her foot.  Pt states the cupping was incredible.  Pt states she could things for 2 days after last Rx that she had  been unable to do.   Pt states her ankle was ok after prior Rx, though she could tell it had been worked.    PERTINENT HISTORY: The patient is s/p left ankle trimalleolar & syndesmosis ORIF, DOS: 06/05/2023.  Removal of hardware on 04/26/24.  Removal of deep hardware on 12/03/2023.   Hx of foot/ankle surgery in 2022  Fibromyalgia, HTN, PE s/p covid in 2023 LBP with MRI (10/2022) evidence of chronic mild anterior wedge compression fracture of T12 and chronic anterior wedge compression deformity of L1   PAIN:  Are you having pain? Yes:  NPRS scale: 3-4/10 in sitting  Pain location:  L ankle and foot   5-6/10 Pain location:  Central and R sided lumbar    PRECAUTIONS: None   WEIGHT BEARING RESTRICTIONS: WBAT  FALLS:  Has patient fallen in last 6 months? Yes. Number of falls 1 fell down wet hill walking dog  LIVING ENVIRONMENT: Lives with: lives alone Lives in: House/apartment Stairs: Yes: External: 3 steps; none Has following equipment at home: Single point cane   OCCUPATION: retired   PLOF: Independent  PATIENT GOALS: walk normally and as pain free as able  NEXT MD VISIT: June 2025  OBJECTIVE:  Note: Objective measures were completed at Evaluation unless otherwise noted.  MRI IMPRESSION: 1. No acute findings or clear explanation for the patient's symptoms. Overall findings are similar to previous CT from 2020. 2. Interval development of a healed mild superior endplate compression deformity at L1 since 2020. No acute osseous findings. 3. Multilevel spondylosis as described, most advanced at L4-5 where there is mild spinal stenosis and asymmetric lateral recess and foraminal narrowing on the left with suspected chronic left L4 nerve root encroachment. 4. Mild asymmetric lateral recess and foraminal narrowing on the left at L5-S1 without definite nerve root encroachment.   LOWER EXTREMITY ROM:   Active ROM Right eval Left eval Left  11/17/23 Right 2/28  Left 2/28 AROM/PROM Left 4/21 AROM  L 5/12  L 6/19 L 6/30  Hip flexion                Hip extension                Hip abduction                Hip adduction                Hip internal rotation                Hip external rotation                Knee flexion                Knee extension  Ankle dorsiflexion   -8 Lacking 2 14 Lacking 7 deg / 3  with pain Lacking 1 degree/pain with AAROM  4 1 (calf tightness) 10deg  Ankle plantarflexion   30d 42 65 55 65deg 65 54 55  Ankle inversion   Limited by 50%   43 24 12 17 22 24   Ankle eversion   Limited by 50%   10 2 with pain 12 5 pain 11 15   (Blank rows = not tested)  LEFS 4/21: 14/80 LEFS: Lower Extremity Functional Score: 30 / 80 = 37.5 % ODI Score = 17 points (34%)  LEFS 6/19: 31/80 6/19 Modified Owsestry: 50%   MMT 4+ throughout L ankle with pain into EV MMT 6/19: 5/5 DF and PF, 4+/5 In,ev.  Hip flexion 11.1lbs R, 17.7, abduction 11.1R, 19L (seated)     TODAY'S TREATMENT:   7/31  Manual Therapy:  STM to R sided lumbar paraspinals f/b static and dynamic cupping to R sided lumbar paraspinals in prone  Seated DF 2x10 Seated heel raises 2x10 Seated BAPS bilat F/B, cw, and ccw 2x10 each Weight shifts s/s on airex x 10 reps Low level march x10 reps  PT answered questions concerning POC and PT scope of practice.    7/28  Manual Therapy:  STM to R sided lumbar paraspinals f/b cupping to R sided lumbar paraspinals in prone  Seated DF 2x10 Seated heel raises 2x10 Seated BAPS bilat F/B, cw, and ccw 2x10 each Standing on airex x 30 sec, 2x30 sec with FT Weight shifts s/s on airex x 10 reps F/B weight shifts Attempted standing gastroc stretch at rail though stopped due to pain Low level march x10 reps  7/24 Pt received L ankle DF, PF, Inv, and Eve PROM per pt and tissue tolerance.  Ankle ABC x 1 rep Seated BAPS bilat F/B, cw, and ccw 2x10 each Seated DF 2x10 Seated heel raises 2x10 Standing on  airex with FT 2x30 sec Attempted staggered stance rocks though pt had pain Longsitting gastroc stretch 3x30 sec  Pt received STM to R sided lumbar paraspinals in prone.    7/21 Pt received L ankle DF, PF, Inv, and Eve PROM per pt and tissue tolerance.  Ankle ABC x 1 rep Seated BAPS bilat F/B 2x10, attempted cw, ccw Eversion/Inversion on half roll 2x10 Seated DF 2x10 Seated heel raises 2x10  Heel to toe rocking with UE support on rail Tandem stance 2x30 sec each LE, 1x20 sec with R foot back Standing on airex with FT x 30 sec  Longsitting gastroc stretch 3x30 sec  Pt received STM to R sided lumbar paraspinals in prone.  PT educated pt in the theracane and demonstrated using the theracane to lumbar paraspinals.   7/17 Pt received L ankle  PROM STM to gastroc, peroneals, tibialis anterior  Ankle ABC x 1 rep AROM 4way x15ea Seated DF 2x10 Seated heel raises 2x10 Seated towel scrunches 3x10 1/2 roll AAROM x15ea   Standing staggered stance rocks for pre gait training x 2 sets Gait in hall x149ft Standing marching x5ea (stopped due to R hip pain).  SLS with UE support x20seconds  7/14 Pt received L ankle DF, PF, Inv, and Eve PROM per pt and tissue tolerance.  Ankle ABC x 1 rep Ankle circles 2x10 cw, ccw Seated DF 2x10 Seated heel raises 2x10 Seated towel scrunches 3x10 Seated BAPS bilat F/B 2x10 Longsitting gastroc stretch 2x30 sec  Standing staggered stance rocks for pre gait training x 2 sets Standing  weight shifts s/s x10  7/10 Pt received L ankle DF, PF, Inv, and Eve PROM per pt and tissue tolerance.  Ankle ABC x 1 rep Ankle pumps 2x10 Ankle circles 2x10 cw, ccw Seated towel scrunches 3x10 Seated BAPS bilat F/B 2x10 Seated Inv/Eve on half roll 2x10 Longsitting gastroc stretch 3x30 sec  Pt ambulated in the hallway without AD and has improved heel to toe gait. She has a slow gait and does favor L LE.   PATIENT EDUCATION:  Education details:  exercise  form, relevant anatomy, POC, and rationale of interventions. Person educated: Patient Education method: Explanation, Demonstration, Tactile cues, Verbal cues Education comprehension: verbalized understanding, returned demonstration, verbal cues required, tactile cues required, and needs further education     HOME EXERCISE PROGRAM: Access Code: [email or web address] URL: [email or web address] Date: 02/27/2024 Prepared by: Gellen April Earnie Starring     ASSESSMENT:  CLINICAL IMPRESSION:    Pt responded well to cupping last Rx and reports significant improvement in pain after cupping.  PT performed cupping again today and pt had an appropriate response.  Pt has much improved sensitivity to palpation and STM to R sided paraspinals.  Pt performed exercises well.  She responded well to Rx reporting no change in ankle pain after Rx.  She reports minimally improved pain in back to 4-5/10 after Rx though states her back feels a whole lot better after cupping and after treatment.   Pt should benefit from cont skilled PT to address impairments and ongoing goals and to assist in restoring desired level of function.         OBJECTIVE IMPAIRMENTS: Abnormal gait, decreased activity tolerance, decreased balance, difficulty walking, decreased ROM, decreased strength, obesity, and pain.   ACTIVITY LIMITATIONS: squatting, stairs, and locomotion level  PARTICIPATION LIMITATIONS: shopping and community activity  PERSONAL FACTORS: Past/current experiences and 1 comorbidity: back dysfunction are also affecting patient's functional outcome.   REHAB POTENTIAL: Good  CLINICAL DECISION MAKING: Evolving/moderate complexity  EVALUATION COMPLEXITY: Moderate   GOALS:  SHORT TERM GOALS: Target date: 11/24/23 Pt will tolerate full aquatic sessions consistently without increase in pain and with improving function to demonstrate good toleration and effectiveness of intervention.  Baseline: Goal status: Met  11/24/23  2.  Pt will navigate stair into and out of pool 6 steps using step through pattern Baseline: step-to pattern Goal status: In progress 11/10/23. Partially met 11/24/23  3.  Pt will report reduction in pain by 3 NPRS post aquatic session which lasts for up to 24 hours. Baseline:  Goal status: Met 11/24/23  4.  Pt will amb without antalgic gait pattern submerged in 3.6 ft Baseline:  Goal status:MET - 11/17/23  5.  Pt will demo improved quality of gait including increased toe off and stance time on L LE with reduced favoring of L LE. Goal status: ongoing Target date:  01/23/2024  6.  Pt will demo improved L ankle AROM to at least 0 deg in DF and 7 deg in Eversion for improved stiffness and gait.  Goal status: MET 6/19 Target date:  01/23/2024     LONG TERM GOALS: Target date: 06/28/2024    Pt will demonstrate an improvement in LEFS by at least 9 points for a clinically significant improvement in self perceived disability.  Baseline: 30/80 Goal status: MET  2.  Pt wiil navigate 1 flight of steps using step alternating pattern indep Baseline:  Goal status: ongoing  3.  Pt will demonstrate improved DF AROM to be w/n 5 deg of  R ankle for improved stiffness, gait, and performance of stairs.  Baseline: see chart Goal status: MET 7/17  4.  Pt will demo left ankle strength to be at least 4/5 in DF and WFL in PF (tested with manual resistance in open chain) in order for improved performance of and tolerance with functional mobility Baseline: see chart Goal status:  MET  5.  Pt will amb without AD without limitation to pain Baseline:using cane or walker  Goal status: MET 7/17  6.  Pt will have improved Oswestry score to >/= 43/50 to demo MCID Baseline: Oswestry Score: 21 / 50 or 42 % Goal status: ongoing (25/50 on 6/19)  7.  Pt will be able to tolerate standing tasks for >/=15 min for household chores/tasks Baseline: 2 min Goal status: MET (max 7/17)  8.  Pt will be  able to demo increased hip strength to at least 4/5 for improved standing tolerance Baseline: See MMT above Goal status: ongoing   PLAN:  PT FREQUENCY: 2x/wk  PT DURATION: 8 weeks     PLANNED INTERVENTIONS: 97164- PT Re-evaluation, 97110-Therapeutic exercises, 97530- Therapeutic activity, 97112- Neuromuscular re-education, 97535- Self Care, [id number]- Manual therapy, [phone]- Gait training, [phone]- Orthotic Fit/training, [phone]- Aquatic Therapy, Patient/Family education, Balance training, Stair training, Taping, Dry Needling, Joint mobilization, DME instructions, Cryotherapy, and Moist heat  PLAN FOR NEXT SESSION:  Cont with ankle ROM.  Gait training.  Continue gross hip and core strengthening. Soft tissue work to lumbar paraspinals.  Cont cupping to lumbar paraspinals.  Leigh Minerva III PT, DPT 06/03/24 9:08 PM

## 2024-06-07 ENCOUNTER — Encounter (HOSPITAL_BASED_OUTPATIENT_CLINIC_OR_DEPARTMENT_OTHER): Payer: Self-pay | Admitting: Physical Therapy

## 2024-06-14 ENCOUNTER — Ambulatory Visit
Admission: RE | Admit: 2024-06-14 | Discharge: 2024-06-14 | Disposition: A | Discharge status: Home / Self Care | Source: Ambulatory Visit | Attending: Plastic Surgery | Admitting: Plastic Surgery

## 2024-06-14 ENCOUNTER — Ambulatory Visit: Admitting: Surgical

## 2024-06-14 VITALS — BP 125/78 | HR 78 | Ht 63.0 in | Wt 239.8 lb

## 2024-06-14 DIAGNOSIS — I2699 Other pulmonary embolism without acute cor pulmonale: Secondary | ICD-10-CM

## 2024-06-14 DIAGNOSIS — G4733 Obstructive sleep apnea (adult) (pediatric): Secondary | ICD-10-CM

## 2024-06-14 DIAGNOSIS — M545 Low back pain, unspecified: Secondary | ICD-10-CM | POA: Diagnosis not present

## 2024-06-14 DIAGNOSIS — N62 Hypertrophy of breast: Secondary | ICD-10-CM

## 2024-06-14 DIAGNOSIS — M546 Pain in thoracic spine: Secondary | ICD-10-CM | POA: Diagnosis not present

## 2024-06-14 DIAGNOSIS — J4530 Mild persistent asthma, uncomplicated: Secondary | ICD-10-CM

## 2024-06-14 DIAGNOSIS — M542 Cervicalgia: Secondary | ICD-10-CM

## 2024-06-14 DIAGNOSIS — G8929 Other chronic pain: Secondary | ICD-10-CM

## 2024-06-14 NOTE — Progress Notes (Signed)
   Referring Provider Tonita Fallow, MD [address] [place],  [place] [address]   CC: No chief complaint on file.     Haley White is an 68 y.o. female.  HPI: Patient is a 68 y.o. year old female here for follow up and guards to physical therapy related to macromastia and upper back pain.  She has been seeing physical therapy for lower back pain as well as for her left ankle, but reports she begins seeing physical therapy for upper back pain this week.  Of note patient has a history of provoked pulmonary embolism with COVID, treated with 6 months of Eliquis .  History of moderate persistent asthma.  Review of Systems General: Positive upper and lower back pain  Physical Exam    05/31/2024    8:34 AM 04/22/2024    3:00 PM 12/03/2023    3:09 PM  Vitals with BMI  Height 5' 2 5' 2   Weight 242 lbs 13 oz 233 lbs   BMI 44.4 42.61   Systolic 143 116 859  Diastolic 81 85 76  Pulse 93 104 74    General:  No acute distress,  Alert and oriented, Non-Toxic, Normal speech and affect Psych: Normal behavior and mood   Assessment/Plan Patient is a very pleasant 67 year old female here to discuss physical therapy related to upper back pain secondary to symptomatic mammary hypertrophy.  She is currently seeing physical therapy for her left ankle as well as low back pain, but she reports she is starting to see PT this week or next week in regards to upper back pain.  We discussed that she will need to complete at a minimum 6 visits over a 6-week period with a minimum of 1 visit per week.  She is going to call us  when she has completed those 6 visits over 6 weeks and we will have her follow-up and discuss any changes in her back pain status.  Patient was agreeable with the plan, all of her questions were answered to her content.  Haley White 06/14/2024, 8:42 AM

## 2024-06-17 ENCOUNTER — Encounter (HOSPITAL_BASED_OUTPATIENT_CLINIC_OR_DEPARTMENT_OTHER): Payer: Self-pay

## 2024-06-17 ENCOUNTER — Ambulatory Visit (HOSPITAL_BASED_OUTPATIENT_CLINIC_OR_DEPARTMENT_OTHER): Payer: Self-pay | Attending: Orthopaedic Surgery

## 2024-06-17 DIAGNOSIS — M25672 Stiffness of left ankle, not elsewhere classified: Secondary | ICD-10-CM | POA: Diagnosis present

## 2024-06-17 DIAGNOSIS — M546 Pain in thoracic spine: Secondary | ICD-10-CM | POA: Insufficient documentation

## 2024-06-17 DIAGNOSIS — M25572 Pain in left ankle and joints of left foot: Secondary | ICD-10-CM | POA: Diagnosis present

## 2024-06-17 DIAGNOSIS — R293 Abnormal posture: Secondary | ICD-10-CM | POA: Diagnosis present

## 2024-06-17 DIAGNOSIS — M5459 Other low back pain: Secondary | ICD-10-CM | POA: Diagnosis present

## 2024-06-17 DIAGNOSIS — R2689 Other abnormalities of gait and mobility: Secondary | ICD-10-CM | POA: Insufficient documentation

## 2024-06-17 DIAGNOSIS — M6281 Muscle weakness (generalized): Secondary | ICD-10-CM | POA: Insufficient documentation

## 2024-06-17 DIAGNOSIS — R262 Difficulty in walking, not elsewhere classified: Secondary | ICD-10-CM | POA: Insufficient documentation

## 2024-06-17 DIAGNOSIS — S82852D Displaced trimalleolar fracture of left lower leg, subsequent encounter for closed fracture with routine healing: Secondary | ICD-10-CM | POA: Insufficient documentation

## 2024-06-17 NOTE — Therapy (Addendum)
 OUTPATIENT PHYSICAL THERAPY LOWER EXTREMITY TREATMENT    Progress Note Reporting Period 05/03/2024 to 06/17/2024  See note below for Objective Data and Assessment of Progress/Goals.              Patient Name: Haley White MRN: [id number] DOB:11-27-1955, 68 y.o., female Today's Date: 06/17/2024  END OF SESSION: PT End of Session - 06/03/2024    Visit Number 39   Number of Visits 44   Date for PT Re-Evaluation 06/28/2024   Authorization Type [employer] MCR   PT Start Time 9061   PT Stop Time 1024   PT Time Calculation (min) 46 min   Activity Tolerance Patient tolerated treatment well   Behavior During Therapy  WFL for tasks assessed/performed             Past Medical History:  Diagnosis Date   Anxiety    Arthritis    Asthma    Cataract    Depression    Gout    Hyperlipidemia    Hypertension    Obese    Sleep apnea    Past Surgical History:  Procedure Laterality Date   ANKLE SURGERY Left    APPENDECTOMY  1984   APPENDECTOMY     CATARACT EXTRACTION W/ INTRAOCULAR LENS  IMPLANT, BILATERAL  2014   CESAREAN SECTION  1984, 1987, 1994   X3   CESAREAN SECTION     x3   COLONOSCOPY  11/16/2013   w/Brodie    EXTERNAL FIXATION LEG Left 06/04/2023   Procedure: EXTERNAL FIXATION LEG;  Surgeon: Barton Drape, MD;  Location: [facility];  Service: Orthopedics;  Laterality: Left;   EYE SURGERY Bilateral    lasix   FOOT FRACTURE SURGERY Left    FOOT SURGERY Left 2002   FRACTURE SURGERY Left    left ankle   HARDWARE REMOVAL Left 12/03/2023   Procedure: HARDWARE REMOVAL OF SYNDEMOSIS;  Surgeon: Barton Drape, MD;  Location: [facility];  Service: Orthopedics;  Laterality: Left;   KNEE SURGERY Left 1998   ORIF ANKLE FRACTURE Left 06/04/2023   Procedure: OPEN REDUCTION INTERNAL FIXATION (ORIF) ANKLE FRACTURE vs EX FIX;  Surgeon: Barton Drape, MD;  Location: [facility];  Service: Orthopedics;  Laterality: Left;   ROTATOR CUFF REPAIR Right 2007    shoulder     torn rotator cuff   TUBAL LIGATION  1994   TUBAL LIGATION     Patient Active Problem List   Diagnosis Date Noted   Neck pain 05/31/2024   Symptomatic mammary hypertrophy 05/31/2024   Prediabetes 04/22/2024   History of thromboembolism 04/22/2024   OSA on CPAP 08/11/2023   Ankle fracture 06/03/2023   Trimalleolar fracture of left ankle 06/02/2023   Acute left ankle pain 06/02/2023   Sleep-related hypoxia 03/24/2023   At risk for obstructive sleep apnea 03/24/2023   Acute pulmonary embolism (HCC) 05/10/2022   Allergic rhinitis 03/15/2021   Gastro-esophageal reflux disease without esophagitis 03/15/2021   Mild persistent asthma, uncomplicated 03/15/2021   Aortic atherosclerosis (HCC) by Chest CT scan on 02/02/2018  01/10/2021   Asthma    Back pain 03/17/2019   Lumbosacral spondylosis without myelopathy 03/17/2019   Pseudogout 12/09/2017   Major depressive disorder, recurrent, severe without psychotic features (HCC)    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   Suicide attempt by drug ingestion (HCC)    Vitamin D  deficiency 08/22/2014   Hyperlipidemia, mixed 08/22/2014   Hypertension    Anxiety    Obesity, unspecified     PCP: Elsie  Tonita MD  REFERRING PROVIDER: Barton Drape, MD (for ankle) Laqueta Sharper, MD (for low back)  REFERRING DIAG: [phone] (ICD-10-CM) - Displaced trimalleolar fracture of left lower leg, subsequent encounter for closed fracture with routine healing   THERAPY DIAG:  Stiffness of left ankle, not elsewhere classified  Muscle weakness (generalized)  Difficulty in walking, not elsewhere classified  Other low back pain  Pain in left ankle and joints of left foot  Rationale for Evaluation and Treatment: Rehabilitation  ONSET DATE: July 2024  SUBJECTIVE:   SUBJECTIVE STATEMENT: Patient reports increased swelling in the left foot and ankle today, unsure why.    PERTINENT HISTORY: The patient is s/p left ankle  trimalleolar & syndesmosis ORIF, DOS: 06/05/2023.  Removal of hardware on 04/26/24.  Removal of deep hardware on 12/03/2023.   Hx of foot/ankle surgery in 2022  Fibromyalgia, HTN, PE s/p covid in 2023 LBP with MRI (10/2022) evidence of chronic mild anterior wedge compression fracture of T12 and chronic anterior wedge compression deformity of L1   PAIN:  Are you having pain? Yes:  NPRS scale: 3-4/10 in sitting  Pain location:  L ankle and foot   5-6/10 Pain location:  Central and R sided lumbar    PRECAUTIONS: None   WEIGHT BEARING RESTRICTIONS: WBAT  FALLS:  Has patient fallen in last 6 months? Yes. Number of falls 1 fell down wet hill walking dog  LIVING ENVIRONMENT: Lives with: lives alone Lives in: House/apartment Stairs: Yes: External: 3 steps; none Has following equipment at home: Single point cane   OCCUPATION: retired   PLOF: Independent  PATIENT GOALS: walk normally and as pain free as able  NEXT MD VISIT: June 2025  OBJECTIVE:  Note: Objective measures were completed at Evaluation unless otherwise noted.  MRI IMPRESSION: 1. No acute findings or clear explanation for the patient's symptoms. Overall findings are similar to previous CT from 2020. 2. Interval development of a healed mild superior endplate compression deformity at L1 since 2020. No acute osseous findings. 3. Multilevel spondylosis as described, most advanced at L4-5 where there is mild spinal stenosis and asymmetric lateral recess and foraminal narrowing on the left with suspected chronic left L4 nerve root encroachment. 4. Mild asymmetric lateral recess and foraminal narrowing on the left at L5-S1 without definite nerve root encroachment.   LOWER EXTREMITY ROM:   Active ROM Right eval Left eval Left  11/17/23 Right 2/28 Left 2/28 AROM/PROM Left 4/21 AROM  L 5/12  L 6/19 L 6/30 L 8/14  Hip flexion                 Hip extension                 Hip abduction                 Hip  adduction                 Hip internal rotation                 Hip external rotation                 Knee flexion                 Knee extension                 Ankle dorsiflexion   -8 Lacking 2 14 Lacking 7 deg / 3  with pain Lacking 1 degree/pain with AAROM  4 1 (calf tightness) 10deg 6  Ankle plantarflexion   30d 42 65 55 65deg 65 54 55 51  Ankle inversion   Limited by 50%   43 24 12 17 22 24 25   Ankle eversion   Limited by 50%   10 2 with pain 12 5 pain 11 15 11    (Blank rows = not tested)  LEFS 4/21: 14/80 LEFS: Lower Extremity Functional Score: 30 / 80 = 37.5 % ODI Score = 17 points (34%)  LEFS 6/19: 31/80 6/19 Modified Owsestry: 50% 8/14 MODI: 30% 8/`4: LEFS:24/80   MMT 4+ throughout L ankle with pain into EV MMT 6/19: 5/5 DF and PF, 4+/5 In,ev.  Hip flexion 11.1lbs R, 17.7, abduction 11.1R, 19L (seated)     TODAY'S TREATMENT:   8/14 -PROM -active ankle ROM each plane x15 -Updated ankle ROM -Standing weight shifts (unable to perform staggered shift with L foot posterior due to pain) -LEFS -MODI   7/31  Manual Therapy:  STM to R sided lumbar paraspinals f/b static and dynamic cupping to R sided lumbar paraspinals in prone  Seated DF 2x10 Seated heel raises 2x10 Seated BAPS bilat F/B, cw, and ccw 2x10 each Weight shifts s/s on airex x 10 reps Low level march x10 reps  PT answered questions concerning POC and PT scope of practice.    7/28  Manual Therapy:  STM to R sided lumbar paraspinals f/b cupping to R sided lumbar paraspinals in prone  Seated DF 2x10 Seated heel raises 2x10 Seated BAPS bilat F/B, cw, and ccw 2x10 each Standing on airex x 30 sec, 2x30 sec with FT Weight shifts s/s on airex x 10 reps F/B weight shifts Attempted standing gastroc stretch at rail though stopped due to pain Low level march x10 reps  7/24 Pt received L ankle DF, PF, Inv, and Eve PROM per pt and tissue tolerance.  Ankle ABC x 1 rep Seated BAPS bilat F/B, cw, and  ccw 2x10 each Seated DF 2x10 Seated heel raises 2x10 Standing on airex with FT 2x30 sec Attempted staggered stance rocks though pt had pain Longsitting gastroc stretch 3x30 sec  Pt received STM to R sided lumbar paraspinals in prone.     PATIENT EDUCATION:  Education details:  exercise form, relevant anatomy, POC, and rationale of interventions. Person educated: Patient Education method: Explanation, Demonstration, Tactile cues, Verbal cues Education comprehension: verbalized understanding, returned demonstration, verbal cues required, tactile cues required, and needs further education     HOME EXERCISE PROGRAM: Access Code: [email or web address] URL: [email or web address] Date: 02/27/2024 Prepared by: Gellen April Earnie Starring     ASSESSMENT:  CLINICAL IMPRESSION:    Pt has attended 40 visits of PT thus far.  Has met 5/6 STG and 4/7 LTG.  Patient had been making steady subjective and objective progress despite being limited today by increase in pain and swelling..  Limited in PT participiation today due to excessive ankle pain and swelling in L LE. Updated ROM showing slight decrease in values due to swelling present.  Patient very tender to palpation.  Will plan to re measure once swelling decreases. Advised in elevation, ice and compression. Educated on signs and symptoms of infection. Awaiting PT script for addition of upper back.  Patient will continue to benefit from PT with focus on left foot and ankle to restore mobility and function to presurgical level.        OBJECTIVE IMPAIRMENTS: Abnormal gait, decreased activity tolerance, decreased balance, difficulty walking, decreased ROM, decreased strength, obesity,  and pain.   ACTIVITY LIMITATIONS: squatting, stairs, and locomotion level  PARTICIPATION LIMITATIONS: shopping and community activity  PERSONAL FACTORS: Past/current experiences and 1 comorbidity: back dysfunction are also affecting patient's functional  outcome.   REHAB POTENTIAL: Good  CLINICAL DECISION MAKING: Evolving/moderate complexity  EVALUATION COMPLEXITY: Moderate   GOALS:  SHORT TERM GOALS: Target date: 11/24/23 Pt will tolerate full aquatic sessions consistently without increase in pain and with improving function to demonstrate good toleration and effectiveness of intervention.  Baseline: Goal status: Met 11/24/23  2.  Pt will navigate stair into and out of pool 6 steps using step through pattern Baseline: step-to pattern Goal status: MET   3.  Pt will report reduction in pain by 3 NPRS post aquatic session which lasts for up to 24 hours. Baseline:  Goal status: Met 11/24/23  4.  Pt will amb without antalgic gait pattern submerged in 3.6 ft Baseline:  Goal status:MET - 11/17/23  5.  Pt will demo improved quality of gait including increased toe off and stance time on L LE with reduced favoring of L LE. Goal status: ongoing Target date:  01/23/2024  6.  Pt will demo improved L ankle AROM to at least 0 deg in DF and 7 deg in Eversion for improved stiffness and gait.  Goal status: MET 6/19 Target date:  01/23/2024     LONG TERM GOALS: Target date: 06/28/2024    Pt will demonstrate an improvement in LEFS by at least 9 points for a clinically significant improvement in self perceived disability.  Baseline: 30/80 Goal status: MET  2.  Pt wiil navigate 1 flight of steps using step alternating pattern indep Baseline:  Goal status: ongoing  3.  Pt will demonstrate improved DF AROM to be w/n 5 deg of R ankle for improved stiffness, gait, and performance of stairs.  Baseline: see chart Goal status: MET 7/17  4.  Pt will demo left ankle strength to be at least 4/5 in DF and WFL in PF (tested with manual resistance in open chain) in order for improved performance of and tolerance with functional mobility Baseline: see chart Goal status:  MET  5.  Pt will amb without AD without limitation to pain Baseline:using cane  or walker  Goal status: MET 7/17  6.  Pt will have improved Oswestry score to >/= 43/50 to demo MCID Baseline: Oswestry Score: 21 / 50 or 42 % Goal status: ongoing (25/50 on 6/19)  7.  Pt will be able to tolerate standing tasks for >/=15 min for household chores/tasks Baseline: 2 min Goal status: MET (max 7/17)  8.  Pt will be able to demo increased hip strength to at least 4/5 for improved standing tolerance Baseline: See MMT above Goal status: ongoing   PLAN:  PT FREQUENCY: 2x/wk  PT DURATION: 8 weeks     PLANNED INTERVENTIONS: 97164- PT Re-evaluation, 97110-Therapeutic exercises, 97530- Therapeutic activity, 97112- Neuromuscular re-education, 97535- Self Care, [id number]- Manual therapy, [phone]- Gait training, [phone]- Orthotic Fit/training, [phone]- Aquatic Therapy, Patient/Family education, Balance training, Stair training, Taping, Dry Needling, Joint mobilization, DME instructions, Cryotherapy, and Moist heat  PLAN FOR NEXT SESSION:  Cont with ankle ROM.  Gait training.  Continue gross hip and core strengthening. Soft tissue work to lumbar paraspinals.  Cont cupping to lumbar paraspinals.  Asberry Rodes, PTA  06/17/24 4:45 PM  PT spoke to PTA and reviewed progress note. Leigh Minerva III PT, DPT 06/18/24 7:23 AM

## 2024-06-19 ENCOUNTER — Ambulatory Visit (HOSPITAL_BASED_OUTPATIENT_CLINIC_OR_DEPARTMENT_OTHER): Admitting: Physical Therapy

## 2024-06-19 DIAGNOSIS — M25672 Stiffness of left ankle, not elsewhere classified: Secondary | ICD-10-CM | POA: Diagnosis not present

## 2024-06-19 DIAGNOSIS — R2689 Other abnormalities of gait and mobility: Secondary | ICD-10-CM

## 2024-06-19 DIAGNOSIS — M25572 Pain in left ankle and joints of left foot: Secondary | ICD-10-CM

## 2024-06-19 DIAGNOSIS — R262 Difficulty in walking, not elsewhere classified: Secondary | ICD-10-CM

## 2024-06-19 DIAGNOSIS — M5459 Other low back pain: Secondary | ICD-10-CM

## 2024-06-19 DIAGNOSIS — S82852D Displaced trimalleolar fracture of left lower leg, subsequent encounter for closed fracture with routine healing: Secondary | ICD-10-CM

## 2024-06-19 DIAGNOSIS — R293 Abnormal posture: Secondary | ICD-10-CM

## 2024-06-19 DIAGNOSIS — M6281 Muscle weakness (generalized): Secondary | ICD-10-CM

## 2024-06-19 NOTE — Therapy (Signed)
 OUTPATIENT PHYSICAL THERAPY LOWER EXTREMITY TREATMENT    Patient Name: Haley White MRN: [id number] DOB:1956-09-09, 68 y.o., female Today's Date: 06/19/2024  END OF SESSION:     Past Medical History:  Diagnosis Date   Anxiety    Arthritis    Asthma    Cataract    Depression    Gout    Hyperlipidemia    Hypertension    Obese    Sleep apnea    Past Surgical History:  Procedure Laterality Date   ANKLE SURGERY Left    APPENDECTOMY  1984   APPENDECTOMY     CATARACT EXTRACTION W/ INTRAOCULAR LENS  IMPLANT, BILATERAL  2014   CESAREAN SECTION  1984, 1987, 1994   X3   CESAREAN SECTION     x3   COLONOSCOPY  11/16/2013   w/Brodie    EXTERNAL FIXATION LEG Left 06/04/2023   Procedure: EXTERNAL FIXATION LEG;  Surgeon: Barton Drape, MD;  Location: [facility];  Service: Orthopedics;  Laterality: Left;   EYE SURGERY Bilateral    lasix   FOOT FRACTURE SURGERY Left    FOOT SURGERY Left 2002   FRACTURE SURGERY Left    left ankle   HARDWARE REMOVAL Left 12/03/2023   Procedure: HARDWARE REMOVAL OF SYNDEMOSIS;  Surgeon: Barton Drape, MD;  Location:  [facility];  Service: Orthopedics;  Laterality: Left;   KNEE SURGERY Left 1998   ORIF ANKLE FRACTURE Left 06/04/2023   Procedure: OPEN REDUCTION INTERNAL FIXATION (ORIF) ANKLE FRACTURE vs EX FIX;  Surgeon: Barton Drape, MD;  Location: [facility];  Service: Orthopedics;  Laterality: Left;   ROTATOR CUFF REPAIR Right 2007   shoulder     torn rotator cuff   TUBAL LIGATION  1994   TUBAL LIGATION     Patient Active Problem List   Diagnosis Date Noted   Neck pain 05/31/2024   Symptomatic mammary hypertrophy 05/31/2024   Prediabetes 04/22/2024   History of thromboembolism 04/22/2024   OSA on CPAP 08/11/2023   Ankle fracture 06/03/2023   Trimalleolar fracture of left ankle 06/02/2023   Acute left ankle pain 06/02/2023   Sleep-related hypoxia 03/24/2023   At risk for obstructive sleep apnea 03/24/2023   Acute  pulmonary embolism (HCC) 05/10/2022   Allergic rhinitis 03/15/2021   Gastro-esophageal reflux disease without esophagitis 03/15/2021   Mild persistent asthma, uncomplicated 03/15/2021   Aortic atherosclerosis (HCC) by Chest CT scan on 02/02/2018  01/10/2021   Asthma    Back pain 03/17/2019   Lumbosacral spondylosis without myelopathy 03/17/2019   Pseudogout 12/09/2017   Major depressive disorder, recurrent, severe without psychotic features (HCC)    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   Suicide attempt by drug ingestion (HCC)    Vitamin D  deficiency 08/22/2014   Hyperlipidemia, mixed 08/22/2014   Hypertension    Anxiety    Obesity, unspecified     PCP: Elsie Richards MD  REFERRING PROVIDER: Barton Drape, MD (for ankle) Laqueta Sharper, MD (for low back)  REFERRING DIAG: S82.852D (ICD-10-CM) - Displaced trimalleolar fracture of left lower leg, subsequent encounter for closed fracture with routine healing   THERAPY DIAG:  Muscle weakness (generalized)  Difficulty in walking, not elsewhere classified  Pain in left ankle and joints of left foot  Other low back pain  Stiffness of left ankle, not elsewhere classified  Closed displaced trimalleolar fracture of left ankle with routine healing, subsequent encounter  Other abnormalities of gait and mobility  Abnormal posture  Rationale for Evaluation and Treatment: Rehabilitation  ONSET DATE:  July 2024  SUBJECTIVE:   SUBJECTIVE STATEMENT: Patient states that she called the MD to get the referral for her thoracic spine sent over. She also reports continued swelling into her foot and has notified the MD. He would like her to come in for a follow up.   PERTINENT HISTORY: The patient is s/p left ankle trimalleolar & syndesmosis ORIF, DOS: 06/05/2023.  Removal of hardware on 04/26/24.  Removal of deep hardware on 12/03/2023.   Hx of foot/ankle surgery in 2022  Fibromyalgia, HTN, PE s/p covid in 2023 LBP with MRI  (10/2022) evidence of chronic mild anterior wedge compression fracture of T12 and chronic anterior wedge compression deformity of L1   PAIN:  Are you having pain? Yes:  NPRS scale: 3-4/10 in sitting  Pain location:  L ankle and foot   5-6/10 Pain location:  Central and R sided lumbar    PRECAUTIONS: None   WEIGHT BEARING RESTRICTIONS: WBAT  FALLS:  Has patient fallen in last 6 months? Yes. Number of falls 1 fell down wet hill walking dog  LIVING ENVIRONMENT: Lives with: lives alone Lives in: House/apartment Stairs: Yes: External: 3 steps; none Has following equipment at home: Single point cane   OCCUPATION: retired   PLOF: Independent  PATIENT GOALS: walk normally and as pain free as able  NEXT MD VISIT: June 2025  OBJECTIVE:  Note: Objective measures were completed at Evaluation unless otherwise noted.  MRI IMPRESSION: 1. No acute findings or clear explanation for the patient's symptoms. Overall findings are similar to previous CT from 2020. 2. Interval development of a healed mild superior endplate compression deformity at L1 since 2020. No acute osseous findings. 3. Multilevel spondylosis as described, most advanced at L4-5 where there is mild spinal stenosis and asymmetric lateral recess and foraminal narrowing on the left with suspected chronic left L4 nerve root encroachment. 4. Mild asymmetric lateral recess and foraminal narrowing on the left at L5-S1 without definite nerve root encroachment.   LOWER EXTREMITY ROM:   Active ROM Right eval Left eval Left  11/17/23 Right 2/28 Left 2/28 AROM/PROM Left 4/21 AROM  L 5/12  L 6/19 L 6/30 L 8/14  Hip flexion                 Hip extension                 Hip abduction                 Hip adduction                 Hip internal rotation                 Hip external rotation                 Knee flexion                 Knee extension                 Ankle dorsiflexion   -8 Lacking 2 14 Lacking 7  deg / 3  with pain Lacking 1 degree/pain with AAROM  4 1 (calf tightness) 10deg 6  Ankle plantarflexion   30d 42 65 55 65deg 65 54 55 51  Ankle inversion   Limited by 50%   43 24 12 17 22 24 25   Ankle eversion   Limited by 50%   10 2 with pain 12 5 pain 11 15 11    (  Blank rows = not tested)  LEFS 4/21: 14/80 LEFS: Lower Extremity Functional Score: 30 / 80 = 37.5 % ODI Score = 17 points (34%)  LEFS 6/19: 31/80 6/19 Modified Owsestry: 50% 8/14 MODI: 30% 8/`4: LEFS:24/80   MMT 4+ throughout L ankle with pain into EV MMT 6/19: 5/5 DF and PF, 4+/5 In,ev.  Hip flexion 11.1lbs R, 17.7, abduction 11.1R, 19L (seated)     TODAY'S TREATMENT:  06/19/24 Manual Therapy:  STM to L plantar fascia   - Cross friction massage to tarsal tunnel area with no tolerance due to hypersensitvity - STM to posterior popliteal region due to possible compression of sciatic nerve.  - Game ready to address swelling and nerve compression in tarsal tunnel.  - Discussion about possible adverse reactions to medications, tarsal tunnel syndrome.   8/14 -PROM -active ankle ROM each plane x15 -Updated ankle ROM -Standing weight shifts (unable to perform staggered shift with L foot posterior due to pain) -LEFS -MODI   7/31  Manual Therapy:  STM to R sided lumbar paraspinals f/b static and dynamic cupping to R sided lumbar paraspinals in prone  Seated DF 2x10 Seated heel raises 2x10 Seated BAPS bilat F/B, cw, and ccw 2x10 each Weight shifts s/s on airex x 10 reps Low level march x10 reps  PT answered questions concerning POC and PT scope of practice.    7/28  Manual Therapy:  STM to R sided lumbar paraspinals f/b cupping to R sided lumbar paraspinals in prone  Seated DF 2x10 Seated heel raises 2x10 Seated BAPS bilat F/B, cw, and ccw 2x10 each Standing on airex x 30 sec, 2x30 sec with FT Weight shifts s/s on airex x 10 reps F/B weight shifts Attempted standing gastroc stretch at rail though stopped  due to pain Low level march x10 reps  7/24 Pt received L ankle DF, PF, Inv, and Eve PROM per pt and tissue tolerance.  Ankle ABC x 1 rep Seated BAPS bilat F/B, cw, and ccw 2x10 each Seated DF 2x10 Seated heel raises 2x10 Standing on airex with FT 2x30 sec Attempted staggered stance rocks though pt had pain Longsitting gastroc stretch 3x30 sec  Pt received STM to R sided lumbar paraspinals in prone.     PATIENT EDUCATION:  Education details:  exercise form, relevant anatomy, POC, and rationale of interventions. Person educated: Patient Education method: Explanation, Demonstration, Tactile cues, Verbal cues Education comprehension: verbalized understanding, returned demonstration, verbal cues required, tactile cues required, and needs further education     HOME EXERCISE PROGRAM: Access Code: [email or web address] URL: [email or web address] Date: 02/27/2024 Prepared by: Gellen April Earnie Starring     ASSESSMENT:  CLINICAL IMPRESSION:    Pt was so hypersensitive to touch today limiting STM or movement today. Lots of discussion about possibilities of tarsal tunnel syndrome, sciatic compression or medication contraindications. Pt encouraged to get further investigation of symptoms and underlying swelling. Pt's session was limited. She responded well to game ready today, but reports increased muscle tension following. Awaiting PT script for addition of upper back.  Patient will continue to benefit from PT with focus on left foot and ankle to restore mobility and function to presurgical level.        OBJECTIVE IMPAIRMENTS: Abnormal gait, decreased activity tolerance, decreased balance, difficulty walking, decreased ROM, decreased strength, obesity, and pain.   ACTIVITY LIMITATIONS: squatting, stairs, and locomotion level  PARTICIPATION LIMITATIONS: shopping and community activity  PERSONAL FACTORS: Past/current experiences and 1 comorbidity: back dysfunction are also affecting  patient's  functional outcome.   REHAB POTENTIAL: Good  CLINICAL DECISION MAKING: Evolving/moderate complexity  EVALUATION COMPLEXITY: Moderate   GOALS:  SHORT TERM GOALS: Target date: 11/24/23 Pt will tolerate full aquatic sessions consistently without increase in pain and with improving function to demonstrate good toleration and effectiveness of intervention.  Baseline: Goal status: Met 11/24/23  2.  Pt will navigate stair into and out of pool 6 steps using step through pattern Baseline: step-to pattern Goal status: MET   3.  Pt will report reduction in pain by 3 NPRS post aquatic session which lasts for up to 24 hours. Baseline:  Goal status: Met 11/24/23  4.  Pt will amb without antalgic gait pattern submerged in 3.6 ft Baseline:  Goal status:MET - 11/17/23  5.  Pt will demo improved quality of gait including increased toe off and stance time on L LE with reduced favoring of L LE. Goal status: ongoing Target date:  01/23/2024  6.  Pt will demo improved L ankle AROM to at least 0 deg in DF and 7 deg in Eversion for improved stiffness and gait.  Goal status: MET 6/19 Target date:  01/23/2024     LONG TERM GOALS: Target date: 06/28/2024    Pt will demonstrate an improvement in LEFS by at least 9 points for a clinically significant improvement in self perceived disability.  Baseline: 30/80 Goal status: MET  2.  Pt wiil navigate 1 flight of steps using step alternating pattern indep Baseline:  Goal status: ongoing  3.  Pt will demonstrate improved DF AROM to be w/n 5 deg of R ankle for improved stiffness, gait, and performance of stairs.  Baseline: see chart Goal status: MET 7/17  4.  Pt will demo left ankle strength to be at least 4/5 in DF and WFL in PF (tested with manual resistance in open chain) in order for improved performance of and tolerance with functional mobility Baseline: see chart Goal status:  MET  5.  Pt will amb without AD without limitation to  pain Baseline:using cane or walker  Goal status: MET 7/17  6.  Pt will have improved Oswestry score to >/= 43/50 to demo MCID Baseline: Oswestry Score: 21 / 50 or 42 % Goal status: ongoing (25/50 on 6/19)  7.  Pt will be able to tolerate standing tasks for >/=15 min for household chores/tasks Baseline: 2 min Goal status: MET (max 7/17)  8.  Pt will be able to demo increased hip strength to at least 4/5 for improved standing tolerance Baseline: See MMT above Goal status: ongoing   PLAN:  PT FREQUENCY: 2x/wk  PT DURATION: 8 weeks     PLANNED INTERVENTIONS: [id number]- PT Re-evaluation, 97110-Therapeutic exercises, 97530- Therapeutic activity, 97112- Neuromuscular re-education, 97535- Self Care, [id number]- Manual therapy, [phone]- Gait training, [phone]- Orthotic Fit/training, [phone]- Aquatic Therapy, Patient/Family education, Balance training, Stair training, Taping, Dry Needling, Joint mobilization, DME instructions, Cryotherapy, and Moist heat  PLAN FOR NEXT SESSION:  Cont with ankle ROM.  Gait training.  Continue gross hip and core strengthening. Soft tissue work to lumbar paraspinals.  Cont cupping to lumbar paraspinals.  Rojean Batten PT, DPT 06/19/24  10:31 AM

## 2024-06-22 ENCOUNTER — Ambulatory Visit (HOSPITAL_BASED_OUTPATIENT_CLINIC_OR_DEPARTMENT_OTHER): Admitting: Physical Therapy

## 2024-06-22 ENCOUNTER — Encounter (HOSPITAL_BASED_OUTPATIENT_CLINIC_OR_DEPARTMENT_OTHER): Payer: Self-pay | Admitting: Physical Therapy

## 2024-06-22 DIAGNOSIS — M5459 Other low back pain: Secondary | ICD-10-CM

## 2024-06-22 DIAGNOSIS — M6281 Muscle weakness (generalized): Secondary | ICD-10-CM

## 2024-06-22 DIAGNOSIS — R262 Difficulty in walking, not elsewhere classified: Secondary | ICD-10-CM

## 2024-06-22 DIAGNOSIS — M25572 Pain in left ankle and joints of left foot: Secondary | ICD-10-CM

## 2024-06-22 DIAGNOSIS — M25672 Stiffness of left ankle, not elsewhere classified: Secondary | ICD-10-CM | POA: Diagnosis not present

## 2024-06-22 DIAGNOSIS — R293 Abnormal posture: Secondary | ICD-10-CM

## 2024-06-22 DIAGNOSIS — M546 Pain in thoracic spine: Secondary | ICD-10-CM

## 2024-06-22 NOTE — Therapy (Signed)
 OUTPATIENT PHYSICAL THERAPY THORACIC EVAL    Patient Name: Haley White MRN: [id number] DOB:01/06/1956, 68 y.o., female Today's Date: 06/22/2024  END OF SESSION:    PT End of Session - 06/22/24 1306     Visit Number 42    Number of Visits 54    Date for PT Re-Evaluation 08/03/24    Authorization Type aetna mcr    PT Start Time 1148    PT Stop Time 1227    PT Time Calculation (min) 39 min    Activity Tolerance Patient tolerated treatment well    Behavior During Therapy WFL for tasks assessed/performed             Past Medical History:  Diagnosis Date   Anxiety    Arthritis    Asthma    Cataract    Depression    Gout    Hyperlipidemia    Hypertension    Obese    Sleep apnea    Past Surgical History:  Procedure Laterality Date   ANKLE SURGERY Left    APPENDECTOMY  1984   APPENDECTOMY     CATARACT EXTRACTION W/ INTRAOCULAR LENS  IMPLANT, BILATERAL  2014   CESAREAN SECTION  1984, 1987, 1994   X3   CESAREAN SECTION     x3   COLONOSCOPY  11/16/2013   w/Brodie    EXTERNAL FIXATION LEG Left 06/04/2023   Procedure: EXTERNAL FIXATION LEG;  Surgeon: Barton Drape, MD;  Location: [facility];  Service: Orthopedics;  Laterality: Left;   EYE SURGERY Bilateral    lasix   FOOT FRACTURE SURGERY Left    FOOT SURGERY Left 2002   FRACTURE SURGERY Left    left ankle   HARDWARE REMOVAL Left 12/03/2023   Procedure: HARDWARE REMOVAL OF SYNDEMOSIS;  Surgeon: Barton Drape, MD;  Location: [facility];  Service: Orthopedics;  Laterality: Left;   KNEE SURGERY Left 1998   ORIF ANKLE FRACTURE Left 06/04/2023   Procedure: OPEN REDUCTION INTERNAL FIXATION (ORIF) ANKLE FRACTURE vs EX FIX;  Surgeon: Barton Drape, MD;  Location: [facility];  Service: Orthopedics;  Laterality: Left;   ROTATOR CUFF REPAIR Right 2007   shoulder     torn rotator cuff   TUBAL LIGATION  1994   TUBAL LIGATION     Patient Active Problem List   Diagnosis Date Noted   Neck pain  05/31/2024   Symptomatic mammary hypertrophy 05/31/2024   Prediabetes 04/22/2024   History of thromboembolism 04/22/2024   OSA on CPAP 08/11/2023   Ankle fracture 06/03/2023   Trimalleolar fracture of left ankle 06/02/2023   Acute left ankle pain 06/02/2023   Sleep-related hypoxia 03/24/2023   At risk for obstructive sleep apnea 03/24/2023   Acute pulmonary embolism (HCC) 05/10/2022   Allergic rhinitis 03/15/2021   Gastro-esophageal reflux disease without esophagitis 03/15/2021   Mild persistent asthma, uncomplicated 03/15/2021   Aortic atherosclerosis (HCC) by Chest CT scan on 02/02/2018  01/10/2021   Asthma    Back pain 03/17/2019   Lumbosacral spondylosis without myelopathy 03/17/2019   Pseudogout 12/09/2017   Major depressive disorder, recurrent, severe without psychotic features (HCC)    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   Suicide attempt by drug ingestion (HCC)    Vitamin D  deficiency 08/22/2014   Hyperlipidemia, mixed 08/22/2014   Hypertension    Anxiety    Obesity, unspecified     PCP: Elsie Richards MD  REFERRING PROVIDER: Barton Drape, MD (for ankle) Laqueta Sharper, MD (for low back)  REFERRING DIAG: [phone] (  ICD-10-CM) - Displaced trimalleolar fracture of left lower leg, subsequent encounter for closed fracture with routine healing   THERAPY DIAG:  Muscle weakness (generalized) - Plan: PT plan of care cert/re-cert  Difficulty in walking, not elsewhere classified - Plan: PT plan of care cert/re-cert  Pain in left ankle and joints of left foot - Plan: PT plan of care cert/re-cert  Other low back pain - Plan: PT plan of care cert/re-cert  Stiffness of left ankle, not elsewhere classified - Plan: PT plan of care cert/re-cert  Abnormal posture - Plan: PT plan of care cert/re-cert  Pain in thoracic spine - Plan: PT plan of care cert/re-cert  Rationale for Evaluation and Treatment: Rehabilitation  ONSET DATE: July 2024  SUBJECTIVE:    SUBJECTIVE STATEMENT:  I will be having a breast reduction done and I have to have 6 weeks of therapy before that. Neck is very stiff today too. Back hurts from positioning its been in for so long, I have some disc issues going on too. Ankle is not doing real well, I feel like I'm just falling apart. I've had 5 surgeries on my left foot in 2 years. I've had a fracture in my upper back in the past but it was years ago. I'd like us  to still work on the ankle but work on the back too. Ankle has been swelling quite a bit.    PERTINENT HISTORY: The patient is s/p left ankle trimalleolar & syndesmosis ORIF, DOS: 06/05/2023.  Removal of hardware on 04/26/24.  Removal of deep hardware on 12/03/2023.   Hx of foot/ankle surgery in 2022  Fibromyalgia, HTN, PE s/p covid in 2023 LBP with MRI (10/2022) evidence of chronic mild anterior wedge compression fracture of T12 and chronic anterior wedge compression deformity of L1   PAIN:  Are you having pain? Yes:  NPRS scale: 3-4/10 thoracic spine and lower back, very tight, standing too long/walking too much makes it feel worse, cupping makes it feel better as does heat  Otherwise general fibro pain     PRECAUTIONS: None   WEIGHT BEARING RESTRICTIONS: WBAT  FALLS:  Has patient fallen in last 6 months? Yes. Number of falls 1 fell down wet hill walking dog  LIVING ENVIRONMENT: Lives with: lives alone Lives in: House/apartment Stairs: Yes: External: 3 steps; none Has following equipment at home: Single point cane   OCCUPATION: retired   PLOF: Independent  PATIENT GOALS: walk normally and as pain free as able  NEXT MD VISIT: June 2025  OBJECTIVE:  Note: Objective measures were completed at Evaluation unless otherwise noted.  MRI IMPRESSION: 1. No acute findings or clear explanation for the patient's symptoms. Overall findings are similar to previous CT from 2020. 2. Interval development of a healed mild superior endplate compression  deformity at L1 since 2020. No acute osseous findings. 3. Multilevel spondylosis as described, most advanced at L4-5 where there is mild spinal stenosis and asymmetric lateral recess and foraminal narrowing on the left with suspected chronic left L4 nerve root encroachment. 4. Mild asymmetric lateral recess and foraminal narrowing on the left at L5-S1 without definite nerve root encroachment.   LOWER EXTREMITY ROM:   Active ROM Right eval Left eval Left  11/17/23 Right 2/28 Left 2/28 AROM/PROM Left 4/21 AROM  L 5/12  L 6/19 L 6/30 L 8/14 L 06/21/24  Hip flexion                  Hip extension  Hip abduction                  Hip adduction                  Hip internal rotation                  Hip external rotation                  Knee flexion                  Knee extension                  Ankle dorsiflexion   -8 Lacking 2 14 Lacking 7 deg / 3  with pain Lacking 1 degree/pain with AAROM  4 1 (calf tightness) 10deg 6 10*  Ankle plantarflexion   30d 42 65 55 65deg 65 54 55 51 48*  Ankle inversion   Limited by 50%   43 24 12 17 22 24 25  19*  Ankle eversion   Limited by 50%   10 2 with pain 12 5 pain 11 15 11  12*   (Blank rows = not tested)  LEFS 4/21: 14/80 LEFS: Lower Extremity Functional Score: 30 / 80 = 37.5 % ODI Score = 17 points (34%)  LEFS 6/19: 31/80 6/19 Modified Owsestry: 50% 8/14 MODI: 30% 8/`4: LEFS:24/80   MMT 4+ throughout L ankle with pain into EV MMT 6/19: 5/5 DF and PF, 4+/5 In,ev.  Hip flexion 11.1lbs R, 17.7, abduction 11.1R, 19L (seated)  06/21/24  Ankle DF L 4+/5, inversion 4+/5, eversion 4+/5 Shoulder strength: flexion 4+/5 B, ABD 4+/5     Back/neck ROM 06/21/24  Cervical AROM: extension about 25% limited, flexion full but painful in collar bones, lateral flexion at least 50% limited B, rotation about 50% limited B  Thoracic AROM: flexion 90% limited, extension WNL but painful/some lumbar popping, lateral flexion 50%  limited B left more painful, rotation 50% limited B Lumbar AROM: 50% limited, extension 90% limited painful, lateral flexion R WNL L 50% limited, rotation R 50% limited L 80% limited  Rounded shoulders, thoracic kyphosis noted     TODAY'S TREATMENT:    06/21/24  Thoracic eval, re-measured ankle and lumbar for cert/POC   Education on findings, POC moving forward in context of upcoming breast reduction surgery, HEP for thoracic region and neck  UBE L2.5 x3 min forward, 3 min backward  Thoracic rotation ROM Thoracic flexion + rotation stretch Thoracic/lumbar extension stretch     06/19/24 Manual Therapy:  STM to L plantar fascia   - Cross friction massage to tarsal tunnel area with no tolerance due to hypersensitvity - STM to posterior popliteal region due to possible compression of sciatic nerve.  - Game ready to address swelling and nerve compression in tarsal tunnel.  - Discussion about possible adverse reactions to medications, tarsal tunnel syndrome.       PATIENT EDUCATION:  Education details:  exercise form, relevant anatomy, POC, and rationale of interventions. Person educated: Patient Education method: Explanation, Demonstration, Tactile cues, Verbal cues Education comprehension: verbalized understanding, returned demonstration, verbal cues required, tactile cues required, and needs further education     HOME EXERCISE PROGRAM: Access Code: [email or web address] URL: [email or web address] Date: 06/22/24 Prepared by: Josette Rough      ASSESSMENT:  CLINICAL IMPRESSION:    Completed formal thoracic eval today as we received referral- see objectives as above. We will increase focus on thoracic/cervical/postural interventions moving  forward, she does request that we continue to work on her ankle moving forward. Will make every effort to work towards goals yet unmet and help prep for her procedure.         OBJECTIVE IMPAIRMENTS: Abnormal gait, decreased  activity tolerance, decreased balance, difficulty walking, decreased ROM, decreased strength, obesity, and pain.   ACTIVITY LIMITATIONS: squatting, stairs, and locomotion level  PARTICIPATION LIMITATIONS: shopping and community activity  PERSONAL FACTORS: Past/current experiences and 1 comorbidity: back dysfunction are also affecting patient's functional outcome.   REHAB POTENTIAL: Good  CLINICAL DECISION MAKING: Evolving/moderate complexity  EVALUATION COMPLEXITY: Moderate   GOALS:  SHORT TERM GOALS: Target date: 07/13/2024   Pt will tolerate full aquatic sessions consistently without increase in pain and with improving function to demonstrate good toleration and effectiveness of intervention.  Baseline: Goal status: Met 11/24/23  2.  Pt will navigate stair into and out of pool 6 steps using step through pattern Baseline: step-to pattern Goal status: MET   3.  Pt will report reduction in pain by 3 NPRS post aquatic session which lasts for up to 24 hours. Baseline:  Goal status: Met 11/24/23  4.  Pt will amb without antalgic gait pattern submerged in 3.6 ft Baseline:  Goal status:MET - 11/17/23  5.  Pt will demo improved quality of gait including increased toe off and stance time on L LE with reduced favoring of L LE. Goal status: ongoing 06/22/24 Target date:    6.  Pt will demo improved L ankle AROM to at least 0 deg in DF and 7 deg in Eversion for improved stiffness and gait.  Goal status: MET 6/19 Target date:  01/23/2024     LONG TERM GOALS: Target date: 08/03/2024      Pt will demonstrate an improvement in LEFS by at least 9 points for a clinically significant improvement in self perceived disability.  Baseline: 30/80 Goal status: MET  2.  Pt wiil navigate 1 flight of steps using step alternating pattern indep Baseline:  Goal status: ongoing 06/22/24  3.  Pt will demonstrate improved DF AROM to be w/n 5 deg of R ankle for improved stiffness, gait, and  performance of stairs.  Baseline: see chart Goal status: MET 7/17  4.  Pt will demo left ankle strength to be at least 4/5 in DF and WFL in PF (tested with manual resistance in open chain) in order for improved performance of and tolerance with functional mobility Baseline: see chart Goal status:  MET  5.  Pt will amb without AD without limitation to pain Baseline:using cane or walker  Goal status: MET 7/17  6.  Pt will have improved Oswestry score to >/= 43/50 to demo MCID Baseline: Oswestry Score: 21 / 50 or 42 % Goal status: ongoing (25/50 on 6/19)  7.  Pt will be able to tolerate standing tasks for >/=15 min for household chores/tasks Baseline: 2 min Goal status: MET (max 7/17)  8.  Pt will be able to demo increased hip strength to at least 4/5 for improved standing tolerance Baseline: See MMT above Goal status: ongoing   9. Thoracic and lumbar AROM to be no more than 25% limited all planes of motion Goal status: NEW  10. Will demonstrate improved awareness of functional posture Goal status: NEW  11. Periscap muscle strength (middle trap, lower trap, etc) to be at least 4/5 Goal status: NEW    PLAN:  PT FREQUENCY: 2x/wk  PT DURATION: 6 weeks  PLANNED INTERVENTIONS: [id number]- PT Re-evaluation, 97110-Therapeutic exercises, 97530- Therapeutic activity, [id number]- Neuromuscular re-education, [phone]- Self Care, [id number]- Manual therapy, [phone]- Gait training, [phone]- Orthotic Fit/training, [phone]- Aquatic Therapy, Patient/Family education, Balance training, Stair training, Taping, Dry Needling, Joint mobilization, DME instructions, Cryotherapy, and Moist heat  PLAN FOR NEXT SESSION:  Cont with ankle ROM.  Gait training.  Continue gross hip and core strengthening. Soft tissue work to lumbar paraspinals.  Cont cupping to lumbar paraspinals. Thoraco-cervical interventions, postural training  Josette Rough, PT, DPT 06/22/24 1:09 PM

## 2024-06-25 ENCOUNTER — Encounter (HOSPITAL_BASED_OUTPATIENT_CLINIC_OR_DEPARTMENT_OTHER): Payer: Self-pay

## 2024-06-25 ENCOUNTER — Ambulatory Visit (HOSPITAL_BASED_OUTPATIENT_CLINIC_OR_DEPARTMENT_OTHER)

## 2024-06-25 DIAGNOSIS — M6281 Muscle weakness (generalized): Secondary | ICD-10-CM

## 2024-06-25 DIAGNOSIS — M25672 Stiffness of left ankle, not elsewhere classified: Secondary | ICD-10-CM | POA: Diagnosis not present

## 2024-06-25 DIAGNOSIS — M25572 Pain in left ankle and joints of left foot: Secondary | ICD-10-CM

## 2024-06-25 DIAGNOSIS — R262 Difficulty in walking, not elsewhere classified: Secondary | ICD-10-CM

## 2024-06-25 NOTE — Therapy (Signed)
 OUTPATIENT PHYSICAL THERAPY THORACIC EVAL    Patient Name: Haley White MRN: [id number] DOB:1956-07-29, 68 y.o., female Today's Date: 06/25/2024  END OF SESSION:    PT End of Session - 06/25/24 1355     Visit Number 43    Number of Visits 54    Date for PT Re-Evaluation 08/03/24    Authorization Type [employer] mcr    PT Start Time 1354   arrived late   PT Stop Time 1435    PT Time Calculation (min) 41 min    Activity Tolerance Patient tolerated treatment well    Behavior During Therapy WFL for tasks assessed/performed              Past Medical History:  Diagnosis Date   Anxiety    Arthritis    Asthma    Cataract    Depression    Gout    Hyperlipidemia    Hypertension    Obese    Sleep apnea    Past Surgical History:  Procedure Laterality Date   ANKLE SURGERY Left    APPENDECTOMY  1984   APPENDECTOMY     CATARACT EXTRACTION W/ INTRAOCULAR LENS  IMPLANT, BILATERAL  2014   CESAREAN SECTION  1984, 1987, 1994   X3   CESAREAN SECTION     x3   COLONOSCOPY  11/16/2013   w/Brodie    EXTERNAL FIXATION LEG Left 06/04/2023   Procedure: EXTERNAL FIXATION LEG;  Surgeon: Barton Drape, MD;  Location: [facility];  Service: Orthopedics;  Laterality: Left;   EYE SURGERY Bilateral    lasix   FOOT FRACTURE SURGERY Left    FOOT SURGERY Left 2002   FRACTURE SURGERY Left    left ankle   HARDWARE REMOVAL Left 12/03/2023   Procedure: HARDWARE REMOVAL OF SYNDEMOSIS;  Surgeon: Barton Drape, MD;  Location: [facility];  Service: Orthopedics;  Laterality: Left;   KNEE SURGERY Left 1998   ORIF ANKLE FRACTURE Left 06/04/2023   Procedure: OPEN REDUCTION INTERNAL FIXATION (ORIF) ANKLE FRACTURE vs EX FIX;  Surgeon: Barton Drape, MD;  Location: [facility];  Service: Orthopedics;  Laterality: Left;   ROTATOR CUFF REPAIR Right 2007   shoulder     torn rotator cuff   TUBAL LIGATION  1994   TUBAL LIGATION     Patient Active Problem List   Diagnosis Date Noted    Neck pain 05/31/2024   Symptomatic mammary hypertrophy 05/31/2024   Prediabetes 04/22/2024   History of thromboembolism 04/22/2024   OSA on CPAP 08/11/2023   Ankle fracture 06/03/2023   Trimalleolar fracture of left ankle 06/02/2023   Acute left ankle pain 06/02/2023   Sleep-related hypoxia 03/24/2023   At risk for obstructive sleep apnea 03/24/2023   Acute pulmonary embolism (HCC) 05/10/2022   Allergic rhinitis 03/15/2021   Gastro-esophageal reflux disease without esophagitis 03/15/2021   Mild persistent asthma, uncomplicated 03/15/2021   Aortic atherosclerosis (HCC) by Chest CT scan on 02/02/2018  01/10/2021   Asthma    Back pain 03/17/2019   Lumbosacral spondylosis without myelopathy 03/17/2019   Pseudogout 12/09/2017   Major depressive disorder, recurrent, severe without psychotic features (HCC)    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   Suicide attempt by drug ingestion (HCC)    Vitamin D  deficiency 08/22/2014   Hyperlipidemia, mixed 08/22/2014   Hypertension    Anxiety    Obesity, unspecified     PCP: Elsie Richards MD  REFERRING PROVIDER: Barton Drape, MD (for ankle) Laqueta Sharper, MD (for low back)  REFERRING DIAG: S82.852D (ICD-10-CM) - Displaced trimalleolar fracture of left lower leg, subsequent encounter for closed fracture with routine healing   THERAPY DIAG:  Muscle weakness (generalized)  Difficulty in walking, not elsewhere classified  Pain in left ankle and joints of left foot  Rationale for Evaluation and Treatment: Rehabilitation  ONSET DATE: July 2024  SUBJECTIVE:   SUBJECTIVE STATEMENT:  Pt reports increased neck pain when turning her head and some popping since last session.    PERTINENT HISTORY: The patient is s/p left ankle trimalleolar & syndesmosis ORIF, DOS: 06/05/2023.  Removal of hardware on 04/26/24.  Removal of deep hardware on 12/03/2023.   Hx of foot/ankle surgery in 2022  Fibromyalgia, HTN, PE s/p covid in 2023 LBP  with MRI (10/2022) evidence of chronic mild anterior wedge compression fracture of T12 and chronic anterior wedge compression deformity of L1   PAIN:  Are you having pain? Yes:  NPRS scale: 3-4/10 thoracic spine and lower back, very tight, standing too long/walking too much makes it feel worse, cupping makes it feel better as does heat  Otherwise general fibro pain     PRECAUTIONS: None   WEIGHT BEARING RESTRICTIONS: WBAT  FALLS:  Has patient fallen in last 6 months? Yes. Number of falls 1 fell down wet hill walking dog  LIVING ENVIRONMENT: Lives with: lives alone Lives in: House/apartment Stairs: Yes: External: 3 steps; none Has following equipment at home: Single point cane   OCCUPATION: retired   PLOF: Independent  PATIENT GOALS: walk normally and as pain free as able  NEXT MD VISIT: June 2025  OBJECTIVE:  Note: Objective measures were completed at Evaluation unless otherwise noted.  MRI IMPRESSION: 1. No acute findings or clear explanation for the patient's symptoms. Overall findings are similar to previous CT from 2020. 2. Interval development of a healed mild superior endplate compression deformity at L1 since 2020. No acute osseous findings. 3. Multilevel spondylosis as described, most advanced at L4-5 where there is mild spinal stenosis and asymmetric lateral recess and foraminal narrowing on the left with suspected chronic left L4 nerve root encroachment. 4. Mild asymmetric lateral recess and foraminal narrowing on the left at L5-S1 without definite nerve root encroachment.   LOWER EXTREMITY ROM:   Active ROM Right eval Left eval Left  11/17/23 Right 2/28 Left 2/28 AROM/PROM Left 4/21 AROM  L 5/12  L 6/19 L 6/30 L 8/14 L 06/21/24  Hip flexion                  Hip extension                  Hip abduction                  Hip adduction                  Hip internal rotation                  Hip external rotation                  Knee flexion                   Knee extension                  Ankle dorsiflexion   -8 Lacking 2 14 Lacking 7 deg / 3  with pain Lacking 1 degree/pain with AAROM  4 1 (calf tightness) 10deg 6 10*  Ankle plantarflexion   30d 42 65 55 65deg 65 54 55 51 48*  Ankle inversion   Limited by 50%   43 24 12 17 22 24 25  19*  Ankle eversion   Limited by 50%   10 2 with pain 12 5 pain 11 15 11  12*   (Blank rows = not tested)  LEFS 4/21: 14/80 LEFS: Lower Extremity Functional Score: 30 / 80 = 37.5 % ODI Score = 17 points (34%)  LEFS 6/19: 31/80 6/19 Modified Owsestry: 50% 8/14 MODI: 30% 8/`4: LEFS:24/80   MMT 4+ throughout L ankle with pain into EV MMT 6/19: 5/5 DF and PF, 4+/5 In,ev.  Hip flexion 11.1lbs R, 17.7, abduction 11.1R, 19L (seated)  06/21/24  Ankle DF L 4+/5, inversion 4+/5, eversion 4+/5 Shoulder strength: flexion 4+/5 B, ABD 4+/5     Back/neck ROM 06/21/24  Cervical AROM: extension about 25% limited, flexion full but painful in collar bones, lateral flexion at least 50% limited B, rotation about 50% limited B  Thoracic AROM: flexion 90% limited, extension WNL but painful/some lumbar popping, lateral flexion 50% limited B left more painful, rotation 50% limited B Lumbar AROM: 50% limited, extension 90% limited painful, lateral flexion R WNL L 50% limited, rotation R 50% limited L 80% limited  Rounded shoulders, thoracic kyphosis noted     TODAY'S TREATMENT:    06/25/24 -STM (gentle) to post tib and peroneal area -Gentle PROM L ankle -Ankle AROM x10ea -Standing row GTB 2x10 (required seated break in between due to foot/ankle pain) -Seated bil ER RTB x10 -Seated horizontal abduction RT x10 -seated wall angel x10 -HPE update/review -Education and instruction on desensitization techniques and self STM. Education on postural awareness and correction with daily ativities  06/21/24  Thoracic eval, re-measured ankle and lumbar for cert/POC   Education on findings, POC moving forward in  context of upcoming breast reduction surgery, HEP for thoracic region and neck  UBE L2.5 x3 min forward, 3 min backward  Thoracic rotation ROM Thoracic flexion + rotation stretch Thoracic/lumbar extension stretch     06/19/24 Manual Therapy:  STM to L plantar fascia   - Cross friction massage to tarsal tunnel area with no tolerance due to hypersensitvity - STM to posterior popliteal region due to possible compression of sciatic nerve.  - Game ready to address swelling and nerve compression in tarsal tunnel.  - Discussion about possible adverse reactions to medications, tarsal tunnel syndrome.       PATIENT EDUCATION:  Education details:  exercise form, relevant anatomy, POC, and rationale of interventions. Person educated: Patient Education method: Explanation, Demonstration, Tactile cues, Verbal cues Education comprehension: verbalized understanding, returned demonstration, verbal cues required, tactile cues required, and needs further education     HOME EXERCISE PROGRAM: Access Code: [email or web address] URL: [email or web address] Date: 06/22/24 Prepared by: Josette Rough      ASSESSMENT:  CLINICAL IMPRESSION:    Patient continues to experience hypersensitivity in foot and ankle as well as anterior distal leg.  Patient with increased pain during passive movement of left ankle.  Ganglion cyst appears to be swollen and may be contributing to her symptoms.  Increased ankle and foot swelling throughout session.  Advised patient to move up her appointment with ankle MD due to new onset of swelling and pain.  Patient limited in PT interventions for ankle and foot due to the symptoms.  Patient does have Dx of fibromyalgia which is likely contributing.  Able to progress with thoracic muscle postural rehab today.  Focus was on neuro readd and activation with cueing as needed for proper shoulder blade and cervical positioning.  Patient felt benefit from added exercise today and was  provided HEP handout.  Spent time educating patient on postural awareness and correct alignment throughout the day.  Advised in postural corrections for positions such as reading and looking at her phone.  Also educated on desensitization techniques for her left foot and ankle as well as gentle self massage to decrease adhesions palpated at lateral incisions.  Will continue to monitor foot and ankle swelling as well as pain level and progress as tolerated.        OBJECTIVE IMPAIRMENTS: Abnormal gait, decreased activity tolerance, decreased balance, difficulty walking, decreased ROM, decreased strength, obesity, and pain.   ACTIVITY LIMITATIONS: squatting, stairs, and locomotion level  PARTICIPATION LIMITATIONS: shopping and community activity  PERSONAL FACTORS: Past/current experiences and 1 comorbidity: back dysfunction are also affecting patient's functional outcome.   REHAB POTENTIAL: Good  CLINICAL DECISION MAKING: Evolving/moderate complexity  EVALUATION COMPLEXITY: Moderate   GOALS:  SHORT TERM GOALS: Target date: 07/13/2024   Pt will tolerate full aquatic sessions consistently without increase in pain and with improving function to demonstrate good toleration and effectiveness of intervention.  Baseline: Goal status: Met 11/24/23  2.  Pt will navigate stair into and out of pool 6 steps using step through pattern Baseline: step-to pattern Goal status: MET   3.  Pt will report reduction in pain by 3 NPRS post aquatic session which lasts for up to 24 hours. Baseline:  Goal status: Met 11/24/23  4.  Pt will amb without antalgic gait pattern submerged in 3.6 ft Baseline:  Goal status:MET - 11/17/23  5.  Pt will demo improved quality of gait including increased toe off and stance time on L LE with reduced favoring of L LE. Goal status: ongoing 06/22/24 Target date:    6.  Pt will demo improved L ankle AROM to at least 0 deg in DF and 7 deg in Eversion for improved stiffness and  gait.  Goal status: MET 6/19 Target date:  01/23/2024     LONG TERM GOALS: Target date: 08/03/2024      Pt will demonstrate an improvement in LEFS by at least 9 points for a clinically significant improvement in self perceived disability.  Baseline: 30/80 Goal status: MET  2.  Pt wiil navigate 1 flight of steps using step alternating pattern indep Baseline:  Goal status: ongoing 06/22/24  3.  Pt will demonstrate improved DF AROM to be w/n 5 deg of R ankle for improved stiffness, gait, and performance of stairs.  Baseline: see chart Goal status: MET 7/17  4.  Pt will demo left ankle strength to be at least 4/5 in DF and WFL in PF (tested with manual resistance in open chain) in order for improved performance of and tolerance with functional mobility Baseline: see chart Goal status:  MET  5.  Pt will amb without AD without limitation to pain Baseline:using cane or walker  Goal status: MET 7/17  6.  Pt will have improved Oswestry score to >/= 43/50 to demo MCID Baseline: Oswestry Score: 21 / 50 or 42 % Goal status: ongoing (25/50 on 6/19)  7.  Pt will be able to tolerate standing tasks for >/=15 min for household chores/tasks Baseline: 2 min Goal status: MET (max 7/17)  8.  Pt will be able to demo increased hip strength to at least 4/5 for improved standing tolerance Baseline:  See MMT above Goal status: ongoing   9. Thoracic and lumbar AROM to be no more than 25% limited all planes of motion Goal status: NEW  10. Will demonstrate improved awareness of functional posture Goal status: NEW  11. Periscap muscle strength (middle trap, lower trap, etc) to be at least 4/5 Goal status: NEW    PLAN:  PT FREQUENCY: 2x/wk  PT DURATION: 6 weeks     PLANNED INTERVENTIONS: [id number]- PT Re-evaluation, 97110-Therapeutic exercises, 97530- Therapeutic activity, 97112- Neuromuscular re-education, 97535- Self Care, [id number]- Manual therapy, [phone]- Gait training, [phone]- Orthotic  Fit/training, [phone]- Aquatic Therapy, Patient/Family education, Balance training, Stair training, Taping, Dry Needling, Joint mobilization, DME instructions, Cryotherapy, and Moist heat  PLAN FOR NEXT SESSION:  Cont with ankle ROM.  Gait training.  Continue gross hip and core strengthening. Soft tissue work to lumbar paraspinals.  Cont cupping to lumbar paraspinals. Thoraco-cervical interventions, postural training Asberry Rodes, PTA  06/25/24 3:13 PM

## 2024-06-29 ENCOUNTER — Other Ambulatory Visit (HOSPITAL_COMMUNITY): Payer: Self-pay | Admitting: Orthopaedic Surgery

## 2024-06-29 ENCOUNTER — Inpatient Hospital Stay (HOSPITAL_COMMUNITY)
Admission: RE | Admit: 2024-06-29 | Discharge: 2024-06-29 | Discharge status: Home / Self Care | Source: Ambulatory Visit | Attending: Vascular Surgery

## 2024-06-29 ENCOUNTER — Encounter (HOSPITAL_COMMUNITY): Payer: Self-pay

## 2024-06-29 ENCOUNTER — Emergency Department (HOSPITAL_COMMUNITY)

## 2024-06-29 ENCOUNTER — Other Ambulatory Visit: Payer: Self-pay

## 2024-06-29 ENCOUNTER — Ambulatory Visit: Payer: Self-pay | Admitting: Internal Medicine

## 2024-06-29 ENCOUNTER — Ambulatory Visit (HOSPITAL_BASED_OUTPATIENT_CLINIC_OR_DEPARTMENT_OTHER): Admitting: Physical Therapy

## 2024-06-29 ENCOUNTER — Encounter (HOSPITAL_BASED_OUTPATIENT_CLINIC_OR_DEPARTMENT_OTHER): Payer: Self-pay | Admitting: Physical Therapy

## 2024-06-29 ENCOUNTER — Emergency Department (HOSPITAL_COMMUNITY)
Admission: EM | Admit: 2024-06-29 | Discharge: 2024-06-30 | Disposition: A | Discharge status: Home / Self Care | Attending: Emergency Medicine | Admitting: Emergency Medicine

## 2024-06-29 DIAGNOSIS — M7989 Other specified soft tissue disorders: Secondary | ICD-10-CM

## 2024-06-29 DIAGNOSIS — M6281 Muscle weakness (generalized): Secondary | ICD-10-CM

## 2024-06-29 DIAGNOSIS — M5459 Other low back pain: Secondary | ICD-10-CM

## 2024-06-29 DIAGNOSIS — Z79899 Other long term (current) drug therapy: Secondary | ICD-10-CM | POA: Diagnosis not present

## 2024-06-29 DIAGNOSIS — R262 Difficulty in walking, not elsewhere classified: Secondary | ICD-10-CM

## 2024-06-29 DIAGNOSIS — I10 Essential (primary) hypertension: Secondary | ICD-10-CM | POA: Diagnosis not present

## 2024-06-29 DIAGNOSIS — M546 Pain in thoracic spine: Secondary | ICD-10-CM

## 2024-06-29 DIAGNOSIS — M25572 Pain in left ankle and joints of left foot: Secondary | ICD-10-CM

## 2024-06-29 DIAGNOSIS — R079 Chest pain, unspecified: Secondary | ICD-10-CM | POA: Diagnosis present

## 2024-06-29 DIAGNOSIS — R0789 Other chest pain: Secondary | ICD-10-CM

## 2024-06-29 DIAGNOSIS — Z7951 Long term (current) use of inhaled steroids: Secondary | ICD-10-CM | POA: Diagnosis not present

## 2024-06-29 DIAGNOSIS — J40 Bronchitis, not specified as acute or chronic: Secondary | ICD-10-CM | POA: Diagnosis not present

## 2024-06-29 DIAGNOSIS — R293 Abnormal posture: Secondary | ICD-10-CM

## 2024-06-29 DIAGNOSIS — R0602 Shortness of breath: Secondary | ICD-10-CM | POA: Diagnosis present

## 2024-06-29 DIAGNOSIS — M25672 Stiffness of left ankle, not elsewhere classified: Secondary | ICD-10-CM

## 2024-06-29 LAB — CBC WITH DIFFERENTIAL/PLATELET
Abs Immature Granulocytes: 0.03 K/uL (ref 0.00–0.07)
Basophils Absolute: 0.1 K/uL (ref 0.0–0.1)
Basophils Relative: 1 %
Eosinophils Absolute: 0.4 K/uL (ref 0.0–0.5)
Eosinophils Relative: 5 %
HCT: 36.4 % (ref 36.0–46.0)
Hemoglobin: 12 g/dL (ref 12.0–15.0)
Immature Granulocytes: 0 %
Lymphocytes Relative: 38 %
Lymphs Abs: 3 K/uL (ref 0.7–4.0)
MCH: 31 pg (ref 26.0–34.0)
MCHC: 33 g/dL (ref 30.0–36.0)
MCV: 94.1 fL (ref 80.0–100.0)
Monocytes Absolute: 0.6 K/uL (ref 0.1–1.0)
Monocytes Relative: 8 %
Neutro Abs: 3.8 K/uL (ref 1.7–7.7)
Neutrophils Relative %: 48 %
Platelets: 254 K/uL (ref 150–400)
RBC: 3.87 MIL/uL (ref 3.87–5.11)
RDW: 12 % (ref 11.5–15.5)
WBC: 7.8 K/uL (ref 4.0–10.5)
nRBC: 0 % (ref 0.0–0.2)

## 2024-06-29 LAB — I-STAT CHEM 8, ED
BUN: 14 mg/dL (ref 8–23)
Calcium, Ion: 1.2 mmol/L (ref 1.15–1.40)
Chloride: 107 mmol/L (ref 98–111)
Creatinine, Ser: 0.9 mg/dL (ref 0.44–1.00)
Glucose, Bld: 121 mg/dL — ABNORMAL HIGH (ref 70–99)
HCT: 35 % — ABNORMAL LOW (ref 36.0–46.0)
Hemoglobin: 11.9 g/dL — ABNORMAL LOW (ref 12.0–15.0)
Potassium: 3.6 mmol/L (ref 3.5–5.1)
Sodium: 142 mmol/L (ref 135–145)
TCO2: 22 mmol/L (ref 22–32)

## 2024-06-29 LAB — RESP PANEL BY RT-PCR (RSV, FLU A&B, COVID)  RVPGX2
Influenza A by PCR: NEGATIVE
Influenza B by PCR: NEGATIVE
Resp Syncytial Virus by PCR: NEGATIVE
SARS Coronavirus 2 by RT PCR: NEGATIVE

## 2024-06-29 LAB — BASIC METABOLIC PANEL WITH GFR
Anion gap: 9 (ref 5–15)
BUN: 13 mg/dL (ref 8–23)
CO2: 24 mmol/L (ref 22–32)
Calcium: 9.2 mg/dL (ref 8.9–10.3)
Chloride: 109 mmol/L (ref 98–111)
Creatinine, Ser: 0.86 mg/dL (ref 0.44–1.00)
GFR, Estimated: >60 mL/min (ref >60)
Glucose, Bld: 128 mg/dL — ABNORMAL HIGH (ref 70–99)
Potassium: 3.8 mmol/L (ref 3.5–5.1)
Sodium: 142 mmol/L (ref 135–145)

## 2024-06-29 LAB — BRAIN NATRIURETIC PEPTIDE: B Natriuretic Peptide: 115.2 pg/mL — ABNORMAL HIGH (ref 0.0–100.0)

## 2024-06-29 MED ORDER — IOHEXOL 350 MG/ML SOLN
65.0000 mL | Freq: Once | INTRAVENOUS | Status: AC | PRN
  Administered 2024-06-29: 65 mL via INTRAVENOUS

## 2024-06-29 NOTE — Telephone Encounter (Signed)
 Patient in ER. Make sure she has a follow up with Dr. Meade .

## 2024-06-29 NOTE — Telephone Encounter (Signed)
 FYI Only or Action Required?: FYI only for provider.  Patient is followed in Pulmonology for asthma and OSA, last seen on 02/11/2024 by Meade Verdon RAMAN, MD.  Called Nurse Triage reporting Shortness of Breath, Wheezing, productive cough to clear sputum, rumbling in the lungs, Chest Pain, and Leg Swelling. - hx of PE  Symptoms began several weeks ago.  Interventions attempted: Increased fluids/rest.  Symptoms are: rapidly worsening.  Triage Disposition: Call EMS 911 Now  Patient/caregiver understands and will follow disposition?: No, refuses disposition      Copied from CRM #[id number]. Topic: Clinical - Red Word Triage >> Jun 29, 2024  3:35 PM Leila C wrote: Red Word that prompted transfer to Nurse Triage: Patient [phone] states coughing some phelgm no color for 2 weeks, shortness of breath, wheezing, rumbling in the lungs and chest pain. Patient denies dizziness, nor fever. Patient wants to see Dr. Meade. Please advise. Reason for Disposition  [1] Chest pain lasts > 5 minutes AND [2] age > 29  Answer Assessment - Initial Assessment Questions Confirms this is different from usual chest tightness with SOB  1. LOCATION: Where does it hurt?       Right above between my boobs 2. RADIATION: Does the pain go anywhere else? (e.g., into neck, jaw, arms, back)     Hard to say, been having pain, going to therapy for it for neck and upper back, if anything it's gotten worse but not sure 3. ONSET: When did the chest pain begin? (Minutes, hours or days)      2-3 weeks, gotten more intense/frequent 4. PATTERN: Does the pain come and go, or has it been constant since it started?  Does it get worse with exertion?      Comes and goes 5. DURATION: How long does it last (e.g., seconds, minutes, hours)     No longer than 5 min, usually pretty short but when happen definitely is there 6. SEVERITY: How bad is the pain?  (e.g., Scale 1-10; mild, moderate, or severe)     3-4/10,  noticeable, definitely worse with activity 7. CARDIAC RISK FACTORS: Do you have any history of heart problems or risk factors for heart disease? (e.g., angina, prior heart attack; diabetes, high blood pressure, high cholesterol, smoker, or strong family history of heart disease)     After had covid hospitalized for 2 PE, laughing because have had big issue with foot, horrible break in it about a year ago and been having bad swelling recently with cyst in it, had ultrasound of leg today Significant hx 8. PULMONARY RISK FACTORS: Do you have any history of lung disease?  (e.g., blood clots in lung, asthma, emphysema, birth control pills)     PE, significant hx 10. OTHER SYMPTOMS: Do you have any other symptoms? (e.g., dizziness, nausea, vomiting, sweating, fever, difficulty breathing, cough)       Wheezing When cough have rumbling feeling deep down in chest that don't usually have SOB mild change from her usual, states not her biggest issue No vomiting, nausea, dizziness, excessive sweating   Advised pt get to hospital right away, call 911, if not wanting 911 then have another adult drive her to hospital asap and call 911 if any worsening at all. Pt states she will get ride to ED straight away.  Protocols used: Chest Pain-A-AH

## 2024-06-29 NOTE — ED Provider Triage Note (Signed)
 Emergency Medicine Provider Triage Evaluation Note  Haley White , a 68 y.o. female  was evaluated in triage.  Pt complains of cp. Report having cp, sob, cough x 2 weeks.  Cough occasionally productive.  No fever, chills, body aches.  Remote hx of PE, currently not on anticoagulant.    Review of Systems  Positive: As above Negative: As above  Physical Exam  BP (!) 160/86 (BP Location: Right Arm)   Pulse 79   Temp 97.6 F (36.4 C)   Resp 18   Ht 5' 3 (1.6 m)   Wt 106.6 kg   LMP 11/26/2011   SpO2 100%   BMI 41.63 kg/m  Gen:   Awake, no distress   Resp:  Normal effort  MSK:   Moves extremities without difficulty  Other:    Medical Decision Making  Medically screening exam initiated at 4:47 PM.  Appropriate orders placed.  Haley White was informed that the remainder of the evaluation will be completed by another provider, this initial triage assessment does not replace that evaluation, and the importance of remaining in the ED until their evaluation is complete.     Nivia Colon, PA-C 06/29/24 [phone]

## 2024-06-29 NOTE — Therapy (Signed)
 OUTPATIENT PHYSICAL THERAPY THORACIC TREATMENT    Patient Name: Haley White MRN: [id number] DOB:10-08-1956, 68 y.o., female Today's Date: 06/29/2024  END OF SESSION:    PT End of Session - 06/29/24 1216     Visit Number 44    Number of Visits 54    Date for PT Re-Evaluation 08/03/24    Authorization Type [employer] mcr    Progress Note Due on Visit 49    PT Start Time 1148    PT Stop Time 1227    PT Time Calculation (min) 39 min    Activity Tolerance Patient tolerated treatment well    Behavior During Therapy WFL for tasks assessed/performed               Past Medical History:  Diagnosis Date   Anxiety    Arthritis    Asthma    Cataract    Depression    Gout    Hyperlipidemia    Hypertension    Obese    Sleep apnea    Past Surgical History:  Procedure Laterality Date   ANKLE SURGERY Left    APPENDECTOMY  1984   APPENDECTOMY     CATARACT EXTRACTION W/ INTRAOCULAR LENS  IMPLANT, BILATERAL  2014   CESAREAN SECTION  1984, 1987, 1994   X3   CESAREAN SECTION     x3   COLONOSCOPY  11/16/2013   w/Brodie    EXTERNAL FIXATION LEG Left 06/04/2023   Procedure: EXTERNAL FIXATION LEG;  Surgeon: Barton Drape, MD;  Location: [facility];  Service: Orthopedics;  Laterality: Left;   EYE SURGERY Bilateral    lasix   FOOT FRACTURE SURGERY Left    FOOT SURGERY Left 2002   FRACTURE SURGERY Left    left ankle   HARDWARE REMOVAL Left 12/03/2023   Procedure: HARDWARE REMOVAL OF SYNDEMOSIS;  Surgeon: Barton Drape, MD;  Location: [facility];  Service: Orthopedics;  Laterality: Left;   KNEE SURGERY Left 1998   ORIF ANKLE FRACTURE Left 06/04/2023   Procedure: OPEN REDUCTION INTERNAL FIXATION (ORIF) ANKLE FRACTURE vs EX FIX;  Surgeon: Barton Drape, MD;  Location: [facility];  Service: Orthopedics;  Laterality: Left;   ROTATOR CUFF REPAIR Right 2007   shoulder     torn rotator cuff   TUBAL LIGATION  1994   TUBAL LIGATION     Patient Active Problem List    Diagnosis Date Noted   Neck pain 05/31/2024   Symptomatic mammary hypertrophy 05/31/2024   Prediabetes 04/22/2024   History of thromboembolism 04/22/2024   OSA on CPAP 08/11/2023   Ankle fracture 06/03/2023   Trimalleolar fracture of left ankle 06/02/2023   Acute left ankle pain 06/02/2023   Sleep-related hypoxia 03/24/2023   At risk for obstructive sleep apnea 03/24/2023   Acute pulmonary embolism (HCC) 05/10/2022   Allergic rhinitis 03/15/2021   Gastro-esophageal reflux disease without esophagitis 03/15/2021   Mild persistent asthma, uncomplicated 03/15/2021   Aortic atherosclerosis (HCC) by Chest CT scan on 02/02/2018  01/10/2021   Asthma    Back pain 03/17/2019   Lumbosacral spondylosis without myelopathy 03/17/2019   Pseudogout 12/09/2017   Major depressive disorder, recurrent, severe without psychotic features (HCC)    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   Suicide attempt by drug ingestion (HCC)    Vitamin D  deficiency 08/22/2014   Hyperlipidemia, mixed 08/22/2014   Hypertension    Anxiety    Obesity, unspecified     PCP: Elsie Richards MD  REFERRING PROVIDER: Barton Drape, MD (for  ankle) Laqueta Sharper, MD (for low back)  REFERRING DIAG: S82.852D (ICD-10-CM) - Displaced trimalleolar fracture of left lower leg, subsequent encounter for closed fracture with routine healing   THERAPY DIAG:  Muscle weakness (generalized)  Difficulty in walking, not elsewhere classified  Pain in left ankle and joints of left foot  Other low back pain  Stiffness of left ankle, not elsewhere classified  Abnormal posture  Pain in thoracic spine  Rationale for Evaluation and Treatment: Rehabilitation  ONSET DATE: July 2024  SUBJECTIVE:   SUBJECTIVE STATEMENT:  Going to get a US  done about a possible clot after today's session. Doing OK today, but pain has been about the same   PERTINENT HISTORY: The patient is s/p left ankle trimalleolar & syndesmosis ORIF, DOS:  06/05/2023.  Removal of hardware on 04/26/24.  Removal of deep hardware on 12/03/2023.   Hx of foot/ankle surgery in 2022  Fibromyalgia, HTN, PE s/p covid in 2023 LBP with MRI (10/2022) evidence of chronic mild anterior wedge compression fracture of T12 and chronic anterior wedge compression deformity of L1   PAIN:  Are you having pain? Yes:  NPRS scale: 5/10 upper back and lower back 3-4/10, very tight, standing too long/walking too much makes it feel worse, cupping makes it feel better as does heat  Otherwise general fibro pain     PRECAUTIONS: None   WEIGHT BEARING RESTRICTIONS: WBAT  FALLS:  Has patient fallen in last 6 months? Yes. Number of falls 1 fell down wet hill walking dog  LIVING ENVIRONMENT: Lives with: lives alone Lives in: House/apartment Stairs: Yes: External: 3 steps; none Has following equipment at home: Single point cane   OCCUPATION: retired   PLOF: Independent  PATIENT GOALS: walk normally and as pain free as able  NEXT MD VISIT: June 2025  OBJECTIVE:  Note: Objective measures were completed at Evaluation unless otherwise noted.  MRI IMPRESSION: 1. No acute findings or clear explanation for the patient's symptoms. Overall findings are similar to previous CT from 2020. 2. Interval development of a healed mild superior endplate compression deformity at L1 since 2020. No acute osseous findings. 3. Multilevel spondylosis as described, most advanced at L4-5 where there is mild spinal stenosis and asymmetric lateral recess and foraminal narrowing on the left with suspected chronic left L4 nerve root encroachment. 4. Mild asymmetric lateral recess and foraminal narrowing on the left at L5-S1 without definite nerve root encroachment.   LOWER EXTREMITY ROM:   Active ROM Right eval Left eval Left  11/17/23 Right 2/28 Left 2/28 AROM/PROM Left 4/21 AROM  L 5/12  L 6/19 L 6/30 L 8/14 L 06/21/24  Hip flexion                  Hip extension                   Hip abduction                  Hip adduction                  Hip internal rotation                  Hip external rotation                  Knee flexion                  Knee extension  Ankle dorsiflexion   -8 Lacking 2 14 Lacking 7 deg / 3  with pain Lacking 1 degree/pain with AAROM  4 1 (calf tightness) 10deg 6 10*  Ankle plantarflexion   30d 42 65 55 65deg 65 54 55 51 48*  Ankle inversion   Limited by 50%   43 24 12 17 22 24 25  19*  Ankle eversion   Limited by 50%   10 2 with pain 12 5 pain 11 15 11  12*   (Blank rows = not tested)  LEFS 4/21: 14/80 LEFS: Lower Extremity Functional Score: 30 / 80 = 37.5 % ODI Score = 17 points (34%)  LEFS 6/19: 31/80 6/19 Modified Owsestry: 50% 8/14 MODI: 30% 8/`4: LEFS:24/80   MMT 4+ throughout L ankle with pain into EV MMT 6/19: 5/5 DF and PF, 4+/5 In,ev.  Hip flexion 11.1lbs R, 17.7, abduction 11.1R, 19L (seated)  06/21/24  Ankle DF L 4+/5, inversion 4+/5, eversion 4+/5 Shoulder strength: flexion 4+/5 B, ABD 4+/5     Back/neck ROM 06/21/24  Cervical AROM: extension about 25% limited, flexion full but painful in collar bones, lateral flexion at least 50% limited B, rotation about 50% limited B  Thoracic AROM: flexion 90% limited, extension WNL but painful/some lumbar popping, lateral flexion 50% limited B left more painful, rotation 50% limited B Lumbar AROM: 50% limited, extension 90% limited painful, lateral flexion R WNL L 50% limited, rotation R 50% limited L 80% limited  Rounded shoulders, thoracic kyphosis noted     TODAY'S TREATMENT:    06/29/24  UBE L2.5 x4 min forward/4 min backward    Seated UT stretch 2x30 seconds B Levator stretch 1x30 seconds B  SCM stretch 1x30 seconds B  Scap retractions 12x3 seconds  Thoracic flexion + rotation stretch 10x3 seconds B Thoracic extension + BUE ER for pec stretch x10  Modified QL stretch 3x10 seconds B  TA sets seated 15x2 seconds  Backward shoulder  rolls x15   06/25/24 -STM (gentle) to post tib and peroneal area -Gentle PROM L ankle -Ankle AROM x10ea -Standing row GTB 2x10 (required seated break in between due to foot/ankle pain) -Seated bil ER RTB x10 -Seated horizontal abduction RT x10 -seated wall angel x10 -HPE update/review -Education and instruction on desensitization techniques and self STM. Education on postural awareness and correction with daily ativities  06/21/24  Thoracic eval, re-measured ankle and lumbar for cert/POC   Education on findings, POC moving forward in context of upcoming breast reduction surgery, HEP for thoracic region and neck  UBE L2.5 x3 min forward, 3 min backward  Thoracic rotation ROM Thoracic flexion + rotation stretch Thoracic/lumbar extension stretch     06/19/24 Manual Therapy:  STM to L plantar fascia   - Cross friction massage to tarsal tunnel area with no tolerance due to hypersensitvity - STM to posterior popliteal region due to possible compression of sciatic nerve.  - Game ready to address swelling and nerve compression in tarsal tunnel.  - Discussion about possible adverse reactions to medications, tarsal tunnel syndrome.       PATIENT EDUCATION:  Education details:  exercise form, relevant anatomy, POC, and rationale of interventions. Person educated: Patient Education method: Explanation, Demonstration, Tactile cues, Verbal cues Education comprehension: verbalized understanding, returned demonstration, verbal cues required, tactile cues required, and needs further education     HOME EXERCISE PROGRAM: Access Code: [email or web address] URL: [email or web address] Date: 06/22/24 Prepared by: Josette Rough      ASSESSMENT:  CLINICAL IMPRESSION:    Arrives  today doing OK, going to have a doppler to r/u possible DVT in her LE after today's PT session. As such, held off on any LE exercises and focused on lumbar/thoracic/cervical interventions this morning. Will  await findings of doppler prior to resuming care on her ankle.         OBJECTIVE IMPAIRMENTS: Abnormal gait, decreased activity tolerance, decreased balance, difficulty walking, decreased ROM, decreased strength, obesity, and pain.   ACTIVITY LIMITATIONS: squatting, stairs, and locomotion level  PARTICIPATION LIMITATIONS: shopping and community activity  PERSONAL FACTORS: Past/current experiences and 1 comorbidity: back dysfunction are also affecting patient's functional outcome.   REHAB POTENTIAL: Good  CLINICAL DECISION MAKING: Evolving/moderate complexity  EVALUATION COMPLEXITY: Moderate   GOALS:  SHORT TERM GOALS: Target date: 07/13/2024   Pt will tolerate full aquatic sessions consistently without increase in pain and with improving function to demonstrate good toleration and effectiveness of intervention.  Baseline: Goal status: Met 11/24/23  2.  Pt will navigate stair into and out of pool 6 steps using step through pattern Baseline: step-to pattern Goal status: MET   3.  Pt will report reduction in pain by 3 NPRS post aquatic session which lasts for up to 24 hours. Baseline:  Goal status: Met 11/24/23  4.  Pt will amb without antalgic gait pattern submerged in 3.6 ft Baseline:  Goal status:MET - 11/17/23  5.  Pt will demo improved quality of gait including increased toe off and stance time on L LE with reduced favoring of L LE. Goal status: ongoing 06/22/24 Target date:    6.  Pt will demo improved L ankle AROM to at least 0 deg in DF and 7 deg in Eversion for improved stiffness and gait.  Goal status: MET 6/19 Target date:  01/23/2024     LONG TERM GOALS: Target date: 08/03/2024      Pt will demonstrate an improvement in LEFS by at least 9 points for a clinically significant improvement in self perceived disability.  Baseline: 30/80 Goal status: MET  2.  Pt wiil navigate 1 flight of steps using step alternating pattern indep Baseline:  Goal status: ongoing  06/22/24  3.  Pt will demonstrate improved DF AROM to be w/n 5 deg of R ankle for improved stiffness, gait, and performance of stairs.  Baseline: see chart Goal status: MET 7/17  4.  Pt will demo left ankle strength to be at least 4/5 in DF and WFL in PF (tested with manual resistance in open chain) in order for improved performance of and tolerance with functional mobility Baseline: see chart Goal status:  MET  5.  Pt will amb without AD without limitation to pain Baseline:using cane or walker  Goal status: MET 7/17  6.  Pt will have improved Oswestry score to >/= 43/50 to demo MCID Baseline: Oswestry Score: 21 / 50 or 42 % Goal status: ongoing (25/50 on 6/19)  7.  Pt will be able to tolerate standing tasks for >/=15 min for household chores/tasks Baseline: 2 min Goal status: MET (max 7/17)  8.  Pt will be able to demo increased hip strength to at least 4/5 for improved standing tolerance Baseline: See MMT above Goal status: ongoing   9. Thoracic and lumbar AROM to be no more than 25% limited all planes of motion Goal status: NEW  10. Will demonstrate improved awareness of functional posture Goal status: NEW  11. Periscap muscle strength (middle trap, lower trap, etc) to be at least 4/5 Goal status:  NEW    PLAN:  PT FREQUENCY: 2x/wk  PT DURATION: 6 weeks     PLANNED INTERVENTIONS: [id number]- PT Re-evaluation, 97110-Therapeutic exercises, 97530- Therapeutic activity, [id number]- Neuromuscular re-education, 97535- Self Care, [id number]- Manual therapy, [phone]- Gait training, [phone]- Orthotic Fit/training, [phone]- Aquatic Therapy, Patient/Family education, Balance training, Stair training, Taping, Dry Needling, Joint mobilization, DME instructions, Cryotherapy, and Moist heat  PLAN FOR NEXT SESSION:  Cont with ankle ROM.  Gait training.  Continue gross hip and core strengthening. Soft tissue work to lumbar paraspinals.  Cont cupping to lumbar paraspinals. Thoraco-cervical  interventions, postural training. Do we have results of doppler/ DVT rule out?   Josette Rough, PT, DPT 06/29/24 12:58 PM

## 2024-06-29 NOTE — ED Triage Notes (Signed)
 Pt reports central chest pain, shortness of breath and a rumbling in her chest when she breathes and coughs. + productive cough;  ongoing for 2-3 weeks. Hx of PE.  She does report bilateral leg swelling as well and she had an US  of  both of her legs today and there was no evidence of DVTs.

## 2024-06-30 LAB — TROPONIN I (HIGH SENSITIVITY)
Troponin I (High Sensitivity): 5 ng/L (ref <18)
Troponin I (High Sensitivity): 4 ng/L (ref <18)

## 2024-06-30 MED ORDER — DOXYCYCLINE HYCLATE 100 MG PO CAPS: 100.0000 mg | ORAL_CAPSULE | Freq: Two times a day (BID) | ORAL | 0 refills | Status: DC

## 2024-06-30 NOTE — ED Provider Notes (Signed)
 [place] EMERGENCY DEPARTMENT AT [facility] Provider Note   CSN: [id number] Arrival date & time: 06/29/24  1625     Patient presents with: Chest Pain   Haley White is a 68 y.o. female.   Patient with a history of hypertension, depression, asthma, anxiety presents with intermittent chest pain x 3 weeks.  States she has central chest pain that comes and goes lasting for few minutes at a time.  This usually comes on with coughing but sometimes comes on without coughing.  Pain lasts about 1 minute then resolves.  No radiation of the pain to her arm, neck or back.  No associated shortness of breath, nausea, vomiting, diaphoresis.  Has had a cough that is nonproductive.  No fever.  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  Did have Doppler studies at her orthopedic doctor earlier today that were negative for DVT.  Her chest pain only comes on when she coughs.  Her cough is nonproductive.  Pain is not exertional or pleuritic.  Denies any cardiac history and reports no recent stress test.  Has had some chronic swelling in her legs for which she had a Dopplers today that were reassuring.  Left-side is usually more swollen than the right no abdominal pain or vomiting.   Chest Pain Associated symptoms: cough, fatigue and shortness of breath   Associated symptoms: no abdominal pain, no dizziness, no fever, no headache, no nausea, no vomiting and no weakness        Prior to Admission medications   Medication Sig Start Date End Date Taking? Authorizing Provider  albuterol  (VENTOLIN  HFA) 108 (90 Base) MCG/ACT inhaler Inhale 2 puffs into the lungs every 6 (six) hours as needed for wheezing or shortness of breath.    [provider]  azelastine  (ASTELIN ) 0.1 % nasal spray Place 1 spray into both nostrils 2 (two) times daily. Use in each nostril as directed 02/11/24   Meade Verdon RAMAN, MD  budesonide  (PULMICORT ) 0.5 MG/2ML  nebulizer solution Take 2 mLs (0.5 mg total) by nebulization daily as needed. 08/21/23   Cranford, Tonya, NP  buPROPion  (WELLBUTRIN  XL) 300 MG 24 hr tablet Take 300 mg by mouth daily.    [provider]  cetirizine  (ZYRTEC  ALLERGY ) 10 MG tablet Take 1 tablet (10 mg total) by mouth daily. 02/11/24   Desai, Nikita S, MD  EPINEPHrine  0.3 mg/0.3 mL IJ SOAJ injection See admin instructions. 02/08/20   [provider]  fexofenadine (ALLEGRA) 180 MG tablet Take 180 mg by mouth daily as needed for allergies.    [provider]  FLUoxetine  (PROZAC ) 20 MG capsule Take 60 mg by mouth daily.    [provider]  fluticasone  (FLONASE) 50 MCG/ACT nasal spray Place 1 spray into both nostrils daily as needed for allergies or rhinitis.    [provider]  lisinopril  (ZESTRIL ) 20 MG tablet Take 2 tablets (40 mg total) by mouth daily. 01/12/24   Webb, Padonda B, FNP  methylphenidate  (RITALIN ) 10 MG tablet Take 10 mg by mouth 2 (two) times daily. 01/16/18   [provider]  montelukast  (SINGULAIR ) 10 MG tablet Take 10 mg by mouth at bedtime.    [provider]  NON FORMULARY Inject 1 Dose into the skin once a week. Allergy  shots from [facility]  at [place]. Patient not sure about the name of medicine    [provider]  pantoprazole  (PROTONIX ) 40 MG tablet  TAKE 1 TABLET BY MOUTH DAILY TO PREVENT INDIGESTION AND HEARTBURN 06/20/23   Wilkinson, Dana E, NP  rosuvastatin  (CRESTOR ) 40 MG tablet Take  1 tablet Daily for Cholesterol            \    TAKE  BY MOUTH 11/01/23   Tonita Fallow, MD  SYMBICORT 160-4.5 MCG/ACT inhaler Inhale 2 puffs into the lungs 2 (two) times daily. 01/16/18   [provider]  traZODone  (DESYREL ) 50 MG tablet Take 1 tablet (50 mg total) by mouth at bedtime. 08/11/23   Dohmeier, Dedra, MD  VITAMIN D  PO Take 10,000 Units by mouth daily.    [provider]    Allergies: Molds & smuts, Biaxin [clarithromycin],  Biaxin [clarithromycin], Prednisone , Prednisone , Serevent [salmeterol], Tequin [gatifloxacin], Tequin [gatifloxacin], and Serevent [salmeterol]    Review of Systems  Constitutional:  Positive for fatigue. Negative for activity change, appetite change and fever.  HENT:  Negative for congestion and rhinorrhea.   Respiratory:  Positive for cough, chest tightness and shortness of breath.   Cardiovascular:  Positive for chest pain and leg swelling.  Gastrointestinal:  Negative for abdominal pain, nausea and vomiting.  Genitourinary:  Negative for dysuria and hematuria.  Musculoskeletal:  Negative for arthralgias and myalgias.  Skin:  Negative for rash.  Neurological:  Negative for dizziness, weakness and headaches.   all other systems are negative except as noted in the HPI and PMH.    Updated Vital Signs BP (!) 148/87   Pulse 77   Temp 97.7 F (36.5 C)   Resp 16   Ht 5' 3 (1.6 m)   Wt 106.6 kg   LMP 11/26/2011   SpO2 98%   BMI 41.63 kg/m   Physical Exam Vitals and nursing note reviewed.  Constitutional:      General: She is not in acute distress.    Appearance: She is well-developed. She is not ill-appearing.     Comments: Speaking full sentences  HENT:     Head: Normocephalic and atraumatic.     Mouth/Throat:     Pharynx: No oropharyngeal exudate.  Eyes:     Conjunctiva/sclera: Conjunctivae normal.     Pupils: Pupils are equal, round, and reactive to light.  Neck:     Comments: No meningismus. Cardiovascular:     Rate and Rhythm: Normal rate and regular rhythm.     Heart sounds: Normal heart sounds. No murmur heard. Pulmonary:     Effort: Pulmonary effort is normal. No respiratory distress.     Breath sounds: Normal breath sounds. No wheezing.  Chest:     Chest wall: Tenderness present.  Abdominal:     Palpations: Abdomen is soft.     Tenderness: There is no abdominal tenderness. There is no guarding or rebound.  Musculoskeletal:        General: No tenderness.  Normal range of motion.     Cervical back: Normal range of motion and neck supple.  Skin:    General: Skin is warm.  Neurological:     Mental Status: She is alert and oriented to person, place, and time.     Cranial Nerves: No cranial nerve deficit.     Motor: No abnormal muscle tone.     Coordination: Coordination normal.     Comments: No ataxia on finger to nose bilaterally. No pronator drift. 5/5 strength throughout. CN 2-12 intact.Equal grip strength. Sensation intact.   Psychiatric:        Behavior: Behavior normal.     (  all labs ordered are listed, but only abnormal results are displayed) Labs Reviewed  BASIC METABOLIC PANEL WITH GFR - Abnormal; Notable for the following components:      Result Value   Glucose, Bld 128 (*)    All other components within normal limits  BRAIN NATRIURETIC PEPTIDE - Abnormal; Notable for the following components:   B Natriuretic Peptide 115.2 (*)    All other components within normal limits  I-STAT CHEM 8, ED - Abnormal; Notable for the following components:   Glucose, Bld 121 (*)    Hemoglobin 11.9 (*)    HCT 35.0 (*)    All other components within normal limits  RESP PANEL BY RT-PCR (RSV, FLU A&B, COVID)  [id number]  CBC WITH DIFFERENTIAL/PLATELET  TROPONIN I (HIGH SENSITIVITY)  TROPONIN I (HIGH SENSITIVITY)    EKG: EKG Interpretation Date/Time:  Tuesday June 29 2024 16:36:21 EDT Ventricular Rate:  71 PR Interval:  158 QRS Duration:  84 QT Interval:  400 QTC Calculation: 434 R Axis:   116  Text Interpretation: Normal sinus rhythm Left posterior fascicular block Cannot rule out Anterior infarct , age undetermined Abnormal ECG When compared with ECG of 10-May-2022 19:19, PREVIOUS ECG IS PRESENT Interpretation limited secondary to artifact No significant change was found Confirmed by Carita Senior [phone]) on 06/30/2024 2:00:15 AM  Radiology: CT Angio Chest PE W and/or Wo Contrast Result Date: 06/29/2024 CLINICAL DATA:  Pulmonary  embolus suspected with high probability. Chest pain and shortness of breath. EXAM: CT ANGIOGRAPHY CHEST WITH CONTRAST TECHNIQUE: Multidetector CT imaging of the chest was performed using the standard protocol during bolus administration of intravenous contrast. Multiplanar CT image reconstructions and MIPs were obtained to evaluate the vascular anatomy. RADIATION DOSE REDUCTION: This exam was performed according to the departmental dose-optimization program which includes automated exposure control, adjustment of the mA and/or kV according to patient size and/or use of iterative reconstruction technique. CONTRAST:  65mL OMNIPAQUE  IOHEXOL  350 MG/ML SOLN COMPARISON:  Chest radiograph 06/29/2024.  CT chest 05/10/2022 FINDINGS: Cardiovascular: Technically adequate study with good opacification of the central and segmental pulmonary arteries. Mild motion artifact. No focal filling defects demonstrated. No evidence of significant pulmonary embolus. Normal heart size. No pericardial effusions. Normal caliber thoracic aorta. Minimal aortic and coronary artery calcification. No dissection. Mediastinum/Nodes: No enlarged mediastinal, hilar, or axillary lymph nodes. Thyroid  gland, trachea, and esophagus demonstrate no significant findings. Lungs/Pleura: Mild dependent atelectasis in the lung bases. Lungs are otherwise clear. No pleural effusion or pneumothorax. Upper Abdomen: No acute abnormalities demonstrated. Musculoskeletal: Degenerative changes in the spine. Review of the MIP images confirms the above findings. IMPRESSION: 1. No evidence of significant pulmonary embolus. 2. No evidence of active pulmonary disease. 3. Minimal aortic atherosclerosis. Electronically Signed   By: Elsie Gravely M.D.   On: 06/29/2024 19:12   DG Chest 2 View Result Date: 06/29/2024 CLINICAL DATA:  Chest pain and shortness of breath. EXAM: CHEST - 2 VIEW COMPARISON:  08/27/2023 FINDINGS: Normal-sized heart. Clear lungs with normal  vascularity. Stable minimal peribronchial thickening. Thoracic spine degenerative changes. Proximal right humerus calcified enchondroma or old infarct. IMPRESSION: No acute abnormality. Stable minimal bronchitic changes. Electronically Signed   By: Elspeth Bathe M.D.   On: 06/29/2024 18:23   VAS US  LOWER EXTREMITY VENOUS (DVT) Result Date: 06/29/2024  Lower Venous DVT Study Patient Name:  CLOTINE HEINER  Date of Exam:   06/29/2024 Medical Rec #: [id number]     Accession #:    [id number] Date of Birth:  [birth year]     Patient Gender: F Patient Age:   83 years Exam Location:  [address] Procedure:      VAS US  LOWER EXTREMITY VENOUS (DVT) Referring Phys: DEEPAK RAMANATHAN --------------------------------------------------------------------------------  Indications: Swelling of the bilateral lower extremities x 1 year.  Performing Technologist: Geni Lodge RVS, RCS  Examination Guidelines: A complete evaluation includes B-mode imaging, spectral Doppler, color Doppler, and power Doppler as needed of all accessible portions of each vessel. Bilateral testing is considered an integral part of a complete examination. Limited examinations for reoccurring indications may be performed as noted. The reflux portion of the exam is performed with the patient in reverse Trendelenburg.  +---------+---------------+---------+-----------+----------+--------------+ RIGHT    CompressibilityPhasicitySpontaneityPropertiesThrombus Aging +---------+---------------+---------+-----------+----------+--------------+ CFV      Full                                                        +---------+---------------+---------+-----------+----------+--------------+ SFJ      Full                                                        +---------+---------------+---------+-----------+----------+--------------+ FV Prox  Full                                                         +---------+---------------+---------+-----------+----------+--------------+ FV Mid   Full                                                        +---------+---------------+---------+-----------+----------+--------------+ FV DistalFull                                                        +---------+---------------+---------+-----------+----------+--------------+ POP      Full                                                        +---------+---------------+---------+-----------+----------+--------------+ PTV      Full                                                        +---------+---------------+---------+-----------+----------+--------------+ PERO     Full                                                        +---------+---------------+---------+-----------+----------+--------------+  GSV      Full                                                        +---------+---------------+---------+-----------+----------+--------------+   +---------+---------------+---------+-----------+----------+--------------+ LEFT     CompressibilityPhasicitySpontaneityPropertiesThrombus Aging +---------+---------------+---------+-----------+----------+--------------+ CFV      Full                                                        +---------+---------------+---------+-----------+----------+--------------+ SFJ      Full                                                        +---------+---------------+---------+-----------+----------+--------------+ FV Prox  Full                                                        +---------+---------------+---------+-----------+----------+--------------+ FV Mid   Full                                                        +---------+---------------+---------+-----------+----------+--------------+ FV DistalFull                                                         +---------+---------------+---------+-----------+----------+--------------+ POP      Full                                                        +---------+---------------+---------+-----------+----------+--------------+ PTV      Full                                                        +---------+---------------+---------+-----------+----------+--------------+ PERO     Full                                                        +---------+---------------+---------+-----------+----------+--------------+ GSV      Full                                                        +---------+---------------+---------+-----------+----------+--------------+  Findings reported to routed in epic.  Summary: BILATERAL: - No evidence of deep vein thrombosis seen in the lower extremities, bilaterally. - No evidence of superficial venous thrombosis in the lower extremities, bilaterally. -   *See table(s) above for measurements and observations. Electronically signed by Lonni Gaskins MD on 06/29/2024 at 1:47:54 PM.    Final      Procedures   Medications Ordered in the ED  iohexol  (OMNIPAQUE ) 350 MG/ML injection 65 mL (65 mLs Intravenous Contrast Given 06/29/24 1831)                                    Medical Decision Making Amount and/or Complexity of Data Reviewed Labs: ordered. Decision-making details documented in ED Course. Radiology: ordered and independent interpretation performed. Decision-making details documented in ED Course. ECG/medicine tests: ordered and independent interpretation performed. Decision-making details documented in ED Course.  Risk Prescription drug management.   3 weeks of intermittent central chest pain worse with coughing.  Not exertional or pleuritic.  No hypoxia or increased work of breathing.  Clear lungs.  EKG shows no acute ischemia.  Chest x-ray in triage is negative for pneumonia or pneumothorax.  Results reviewed and interpreted by me.  Did  have a CT scan in triage that was negative for pulmonary embolism or pneumonia.  Repeat EKG shows no acute ischemia.  Description of chest pain is atypical for ACS that comes and goes lasting for few minutes at a time and is worse with coughing.  Will trend troponins  Troponin negative x 2.  Patient's description of chest pain is atypical for ACS.  PE has been ruled out with CT scan in triage.  Did have Dopplers of her legs earlier in the day that were negative as well.  Discussed she is low risk for coronary disease but nonzero risk.  Recommend outpatient follow-up with cardiology for stress test.  Will give antibiotics for bronchitis given her persistent cough.  She is not wheezing so we will withhold steroids at this time. Her chest pain is intermittent and worse with coughing lasting for a few minutes at a time.  Discussed she should return to the ED if she develops exertional chest pain, pain associated shortness of breath, nausea, vomiting, sweating or any other concerns.     Final diagnoses:  Atypical chest pain  Bronchitis    ED Discharge Orders     None          Chalene Treu, Garnette, MD 06/30/24 [phone]

## 2024-06-30 NOTE — Discharge Instructions (Signed)
 Your testing is reassuring.  No evidence of heart attack or blood clot in your lung.  As we discussed you are low risk for heart disease but nonzero risk.  You should follow-up with a cardiologist for a stress test.  Return to the ED with exertional chest pain, pain associate with shortness of breath, nausea, vomiting, sweating or other concerns.

## 2024-06-30 NOTE — Telephone Encounter (Signed)
 Left message on patients VM to call clinic to schedule hospital follow up visit.

## 2024-07-01 ENCOUNTER — Encounter: Payer: Self-pay | Admitting: Physician Assistant

## 2024-07-01 ENCOUNTER — Ambulatory Visit: Attending: Physician Assistant | Admitting: Physician Assistant

## 2024-07-01 VITALS — BP 140/80 | HR 84 | Ht 63.0 in | Wt 241.8 lb

## 2024-07-01 DIAGNOSIS — R072 Precordial pain: Secondary | ICD-10-CM | POA: Diagnosis not present

## 2024-07-01 DIAGNOSIS — J45909 Unspecified asthma, uncomplicated: Secondary | ICD-10-CM

## 2024-07-01 DIAGNOSIS — Z6841 Body Mass Index (BMI) 40.0 and over, adult: Secondary | ICD-10-CM

## 2024-07-01 DIAGNOSIS — I10 Essential (primary) hypertension: Secondary | ICD-10-CM | POA: Diagnosis not present

## 2024-07-01 DIAGNOSIS — G4733 Obstructive sleep apnea (adult) (pediatric): Secondary | ICD-10-CM

## 2024-07-01 DIAGNOSIS — R0602 Shortness of breath: Secondary | ICD-10-CM | POA: Diagnosis not present

## 2024-07-01 DIAGNOSIS — E785 Hyperlipidemia, unspecified: Secondary | ICD-10-CM

## 2024-07-01 DIAGNOSIS — E66813 Obesity, class 3: Secondary | ICD-10-CM

## 2024-07-01 MED ORDER — ASPIRIN 81 MG PO TBEC
81.0000 mg | DELAYED_RELEASE_TABLET | Freq: Every day | ORAL | Status: AC
Start: 2024-07-01 — End: ?

## 2024-07-01 MED ORDER — METOPROLOL TARTRATE 100 MG PO TABS
ORAL_TABLET | ORAL | 0 refills | Status: DC
Start: 2024-07-01 — End: 2024-07-12

## 2024-07-01 MED ORDER — NITROGLYCERIN 0.4 MG SL SUBL: 0.4000 mg | SUBLINGUAL_TABLET | SUBLINGUAL | 3 refills | Status: DC | PRN

## 2024-07-01 NOTE — Progress Notes (Signed)
 Cardiology Office Note:    Date:  07/01/2024   ID:  Haley White, DOB 24-Jul-1956, MRN [id number]  PCP:  Laurice President, NP   [facility] Providers Cardiologist:  Vina Gull, MD Cardiology APP:  Madie Jon Garre, PA     Referring MD: Carita Senior, MD   Chief Complaint  Patient presents with   New Patient (Initial Visit)    Chest pain    History of Present Illness:    Haley White is a 68 y.o. female with a hx of HTN, PE no longer on OAC, depression, anxiety, and asthma.  She was evaluated in the ER 06/30/24 for chest pain and ruled out with negative CE and nonischemic EKG. CP occurs with coughing without radiation or associated symptoms. Given hx of PE, CTA was obtained and was negative for PE. Doppler study at orthopedic office earlier that day were negative for DVT.   She presents today for evaluation of CP. CP started 2-3 weeks ago, coughing started worsening 2-3 weeks ago. She has chronic cough with asthma. She follows with pulmonology. She was concerned about CP occurring outside of her cough. CP does not wake her from sleep and is not associated with minimal activity (sedentary after ankle surgery, as below). CP located in the center, CP lasts an hour, improved with rest.   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 She completed 6 months of eliquis  treatment. PE considered provoked.    She is about to start with [facility] management through [facility]. She is not active currently following several orthopedic issues requiring surgical repair of ankle fracture. Surgery was 05/2023. She is still going to PT. She walks with a cane. She is unable to complete 4.0 METS.   No prior cardiac history. She is a nonsmoker. She drinks 2 glasses of wine weekly. She uses THC gummies for ankle pain. She lives at home alone. Her daughter lives close, she has three children and three grandkids. She is retired from Ha[employer] and loves to travel [place].   Past Medical  History:  Diagnosis Date   Anxiety    Arthritis    Asthma    Cataract    Depression    Gout    Hyperlipidemia    Hypertension    Obese    Sleep apnea     Past Surgical History:  Procedure Laterality Date   ANKLE SURGERY Left    APPENDECTOMY  1984   APPENDECTOMY     CATARACT EXTRACTION W/ INTRAOCULAR LENS  IMPLANT, BILATERAL  2014   CESAREAN SECTION  1984, 1987, 1994   X3   CESAREAN SECTION     x3   COLONOSCOPY  11/16/2013   w/Brodie    EXTERNAL FIXATION LEG Left 06/04/2023   Procedure: EXTERNAL FIXATION LEG;  Surgeon: Barton Drape, MD;  Location: [facility];  Service: Orthopedics;  Laterality: Left;   EYE SURGERY Bilateral    lasix   FOOT FRACTURE SURGERY Left    FOOT SURGERY Left 2002   FRACTURE SURGERY Left    left ankle   HARDWARE REMOVAL Left 12/03/2023   Procedure: HARDWARE REMOVAL OF SYNDEMOSIS;  Surgeon: Barton Drape, MD;  Location: [facility];  Service: Orthopedics;  Laterality: Left;   KNEE SURGERY Left 1998   ORIF ANKLE FRACTURE Left 06/04/2023   Procedure: OPEN REDUCTION INTERNAL FIXATION (ORIF) ANKLE FRACTURE vs EX FIX;  Surgeon: Barton Drape, MD;  Location: [facility];  Service: Orthopedics;  Laterality: Left;  ROTATOR CUFF REPAIR Right 2007   shoulder     torn rotator cuff   TUBAL LIGATION  1994   TUBAL LIGATION      Current Medications: Current Meds  Medication Sig   albuterol  (VENTOLIN  HFA) 108 (90 Base) MCG/ACT inhaler Inhale 2 puffs into the lungs every 6 (six) hours as needed for wheezing or shortness of breath.   aspirin  EC 81 MG tablet Take 1 tablet (81 mg total) by mouth daily. Swallow whole.   azelastine  (ASTELIN ) 0.1 % nasal spray Place 1 spray into both nostrils 2 (two) times daily. Use in each nostril as directed   budesonide  (PULMICORT ) 0.5 MG/2ML nebulizer solution Take 2 mLs (0.5 mg total) by nebulization daily as needed.   buPROPion  (WELLBUTRIN  XL) 300 MG 24 hr tablet Take 300 mg by mouth daily.   cetirizine   (ZYRTEC  ALLERGY ) 10 MG tablet Take 1 tablet (10 mg total) by mouth daily.   doxycycline  (VIBRAMYCIN ) 100 MG capsule Take 1 capsule (100 mg total) by mouth 2 (two) times daily.   EPINEPHrine  0.3 mg/0.3 mL IJ SOAJ injection See admin instructions.   fexofenadine (ALLEGRA) 180 MG tablet Take 180 mg by mouth daily as needed for allergies.   FLUoxetine  (PROZAC ) 20 MG capsule Take 60 mg by mouth daily.   fluticasone  (FLONASE) 50 MCG/ACT nasal spray Place 1 spray into both nostrils daily as needed for allergies or rhinitis.   lisinopril  (ZESTRIL ) 20 MG tablet Take 2 tablets (40 mg total) by mouth daily. (Patient taking differently: Take 20 mg by mouth daily.)   methylphenidate  (RITALIN ) 10 MG tablet Take 10 mg by mouth 2 (two) times daily.   metoprolol  tartrate (LOPRESSOR ) 100 MG tablet TAKE 1 TABLET BY MOUTH 2 HOURS PRIOR TO THE CARDIAC CT   montelukast  (SINGULAIR ) 10 MG tablet Take 10 mg by mouth at bedtime.   nitroGLYCERIN  (NITROSTAT ) 0.4 MG SL tablet Place 1 tablet (0.4 mg total) under the tongue every 5 (five) minutes as needed for chest pain.   NON FORMULARY Inject 1 Dose into the skin once a week. Allergy  shots from [facility]  at [place]. Patient not sure about the name of medicine   pantoprazole  (PROTONIX ) 40 MG tablet TAKE 1 TABLET BY MOUTH DAILY TO PREVENT INDIGESTION AND HEARTBURN   rosuvastatin  (CRESTOR ) 40 MG tablet Take  1 tablet Daily for Cholesterol            \    TAKE  BY MOUTH   SYMBICORT 160-4.5 MCG/ACT inhaler Inhale 2 puffs into the lungs 2 (two) times daily.   traZODone  (DESYREL ) 50 MG tablet Take 1 tablet (50 mg total) by mouth at bedtime.   VITAMIN D  PO Take 10,000 Units by mouth daily.   Current Facility-Administered Medications for the 07/01/24 encounter (Office Visit) with Madie Jon Garre, PA  Medication   ipratropium-albuterol  (DUONEB) 0.5-2.5 (3) MG/3ML nebulizer solution 3 mL     Allergies:   Molds & smuts, Biaxin [clarithromycin], Biaxin [clarithromycin],  Prednisone , Prednisone , Serevent [salmeterol], Tequin [gatifloxacin], Tequin [gatifloxacin], and Serevent [salmeterol]   Social History   Socioeconomic History   Marital status: Divorced    Spouse name: Not on file   Number of children: Not on file   Years of education: Not on file   Highest education level: Not on file  Occupational History   Not on file  Tobacco Use   Smoking status: Never   Smokeless tobacco: Never  Vaping Use   Vaping status: Never Used  Substance and  Sexual Activity   Alcohol use: Yes    Comment: occasionally   Drug use: Never    Comment: 06/03/2022-gummies   Sexual activity: Yes    Birth control/protection: Post-menopausal, Surgical  Other Topics Concern   Not on file  Social History Narrative   ** Merged History Encounter **       Social Drivers of Health   Financial Resource Strain: Not on file  Food Insecurity: Patient Declined (06/03/2023)   Hunger Vital Sign    Worried About Running Out of Food in the Last Year: Patient declined    Ran Out of Food in the Last Year: Patient declined  Transportation Needs: No Transportation Needs (06/02/2023)   PRAPARE - Administrator, Civil Service (Medical): No    Lack of Transportation (Non-Medical): No  Physical Activity: Not on file  Stress: Not on file  Social Connections: Not on file     Family History: The patient's family history includes Anxiety disorder in her sister; Dementia in her father. There is no history of Colon cancer, Colon polyps, Esophageal cancer, Stomach cancer, or Rectal cancer.  ROS:   Please see the history of present illness.     All other systems reviewed and are negative.  EKGs/Labs/Other Studies Reviewed:    The following studies were reviewed today:       Recent Labs: 10/24/2023: ALT 30 06/29/2024: B Natriuretic Peptide 115.2; BUN 14; Creatinine, Ser 0.90; Hemoglobin 11.9; Platelets 254; Potassium 3.6; Sodium 142  Recent Lipid Panel    Component Value  Date/Time   CHOL 215 (H) 10/24/2023 1124   TRIG 163 (H) 10/24/2023 1124   HDL 97 10/24/2023 1124   CHOLHDL 2.2 10/24/2023 1124   VLDL 21 03/30/2015 0926   LDLCALC 92 10/24/2023 1124     Risk Assessment/Calculations:                Physical Exam:    VS:  BP (!) 140/80   Pulse 84   Ht 5' 3 (1.6 m)   Wt 241 lb 12.8 oz (109.7 kg)   LMP 11/26/2011   SpO2 95%   BMI 42.83 kg/m     Wt Readings from Last 3 Encounters:  07/01/24 241 lb 12.8 oz (109.7 kg)  06/29/24 235 lb (106.6 kg)  06/14/24 239 lb 12.8 oz (108.8 kg)     GEN:  Well nourished, well developed in no acute distress HEENT: Normal NECK: No JVD; No carotid bruits LYMPHATICS: No lymphadenopathy CARDIAC: RRR, no murmurs, rubs, gallops RESPIRATORY:  Clear to auscultation without rales, wheezing or rhonchi  ABDOMEN: Soft, non-tender, non-distended MUSCULOSKELETAL:  No edema; No deformity  SKIN: Warm and dry NEUROLOGIC:  Alert and oriented x 3 PSYCHIATRIC:  Normal affect   ASSESSMENT:    1. Precordial pain   2. SOB (shortness of breath)   3. Primary hypertension   4. OSA on CPAP   5. Class 3 severe obesity with serious comorbidity and body mass index (BMI) of 40.0 to 44.9 in adult, unspecified obesity type   6. Asthma, unspecified asthma severity, unspecified whether complicated, unspecified whether persistent   7. Hyperlipidemia with target LDL less than 70    PLAN:    In order of problems listed above:  Chest pain - not clearly anginal in nature, but concerning given that she has been more sedentary since ankle surgery last year - will obtain echocardiogram and CT coronary - continue 40 mg crestor  - start 81 mg ASA and provide SL NTG  PRN   Hypertension - continue 20 mg lisinopril    Hyperlipidemia with LDL goal < 70 - given HTN and prediabetes, would like to be more aggressive with goal - due to have lipid panel with PCP tomorrow - last LDL was 92   Obesity - due to start healthy weight  management with [facility] next month - also due for breast reduction - preop risk stratification following CT coronary and echo   Follow up with me in Oct, sooner if abnormal studies.            Medication Adjustments/Labs and Tests Ordered: Current medicines are reviewed at length with the patient today.  Concerns regarding medicines are outlined above.  Orders Placed This Encounter  Procedures   CT CORONARY MORPH W/CTA COR W/SCORE W/CA W/CM &/OR WO/CM   ECHOCARDIOGRAM COMPLETE   Meds ordered this encounter  Medications   metoprolol  tartrate (LOPRESSOR ) 100 MG tablet    Sig: TAKE 1 TABLET BY MOUTH 2 HOURS PRIOR TO THE CARDIAC CT    Dispense:  1 tablet    Refill:  0   nitroGLYCERIN  (NITROSTAT ) 0.4 MG SL tablet    Sig: Place 1 tablet (0.4 mg total) under the tongue every 5 (five) minutes as needed for chest pain.    Dispense:  25 tablet    Refill:  3   aspirin  EC 81 MG tablet    Sig: Take 1 tablet (81 mg total) by mouth daily. Swallow whole.    Patient Instructions  Medication Instructions:  Your physician has recommended you make the following change in your medication:   START Aspirin  81 mg taking 1 daily  START Nitroglycerin  0.4 s/l tablet.. use as directed.. The proper use and anticipated side effects of nitroglycerine has been carefully explained.  If a single episode of chest pain is not relieved by one tablet, the patient will try another within 5 minutes; and if this doesn't relieve the pain, the patient is instructed to call 911 for transportation to an emergency department.   *If you need a refill on your cardiac medications before your next appointment, please call your pharmacy*  Lab Work: None ordered  If you have labs (blood work) drawn today and your tests are completely normal, you will receive your results only by: MyChart Message (if you have MyChart) OR A paper copy in the mail If you have any lab test that is abnormal or we need to change your treatment,  we will call you to review the results.  Testing/Procedures: Your physician has requested that you have an echocardiogram. Echocardiography is a painless test that uses sound waves to create images of your heart. It provides your doctor with information about the size and shape of your heart and how well your heart's chambers and valves are working. This procedure takes approximately one hour. There are no restrictions for this procedure. Please do NOT wear cologne, perfume, aftershave, or lotions (deodorant is allowed). Please arrive 15 minutes prior to your appointment time.  Please note: We ask at that you not bring children with you during ultrasound (echo/ vascular) testing. Due to room size and safety concerns, children are not allowed in the ultrasound rooms during exams. Our front office staff cannot provide observation of children in our lobby area while testing is being conducted. An adult accompanying a patient to their appointment will only be allowed in the ultrasound room at the discretion of the ultrasound technician under special circumstances. We apologize for any inconvenience.  Your physician has requested that you have cardiac CT. Cardiac computed tomography (CT) is a painless test that uses an x-ray machine to take clear, detailed pictures of your heart. For further information please visit [email or web address] Please follow instruction sheet BELOW:     Your cardiac CT will be scheduled at one of the below locations:   [facility] [address] [place], [place] [address] [phone] (Severe contrast allergies only)  OR   [facility] [address] [place], [place] [address] [phone]  OR   [facility] [address] [place], [place] [address] [phone]  OR   [facility] and Vascular Tower [address]  [place], [place] [address] [phone]  OR   [facility] [address] [place], [place] [phone]  If scheduled at [facility], please arrive at the [facility] and Children's Entrance (Entrance C2) of [facility] 30 minutes prior to test start time. You can use the FREE valet parking offered at entrance C (encouraged to control the heart rate for the test)  Proceed to the [facility] Radiology Department (first floor) to check-in and test prep.  All radiology patients and guests should use entrance C2 at [facility], accessed from [facility], even though the hospital's physical address listed is [address].  If scheduled at the Heart and Vascular Tower at [address], please enter the parking lot using the [address] entrance and use the FREE valet service at the patient drop-off area. Enter the building and check-in with registration on the main floor.  If scheduled at [facility], please arrive to the Heart and Vascular Center 15 mins early for check-in and test prep.  There is spacious parking and easy access to the radiology department from the [facility] Heart and Vascular entrance. Please enter here and check-in with the desk attendant.   If scheduled at [facility], please arrive 30 minutes early for check-in and test prep.  Please follow these instructions carefully (unless otherwise directed):  An IV will be required for this test and Nitroglycerin  will be given.    On the Night Before the Test: Be sure to Drink plenty of water. Do not consume any caffeinated/decaffeinated beverages or chocolate 12 hours prior to your test. Do not take any antihistamines 12 hours prior to your test.    On the Day of the Test: Drink plenty of water until 1 hour prior to the test. Do not eat any food 1 hour prior to test. You may take your regular medications prior to the test.  Take metoprolol  (Lopressor ) 100 MG two hours prior to test. THIS HAS BEEN SENT TO  [facility]- please wear underwire-free bra if available, avoid dresses & tight clothing     After the Test: Drink plenty of water. After receiving IV contrast, you may experience a mild flushed feeling. This is normal. On occasion, you may experience a mild rash up to 24 hours after the test. This is not dangerous. If this occurs, you can take Benadryl  25 mg, Zyrtec , Claritin , or Allegra and increase your fluid intake. (Patients taking Tikosyn should avoid Benadryl , and may take Zyrtec , Claritin , or Allegra) If you experience trouble breathing, this can be serious. If it is severe call 911 IMMEDIATELY. If it is mild, please call our office.  We will call to schedule your test 2-4 weeks out understanding that some insurance companies  will need an authorization prior to the service being performed.   For more information and frequently asked questions, please visit our website : [email or web address]  For non-scheduling related questions, please contact the cardiac imaging nurse navigator should you have any questions/concerns: Cardiac Imaging Nurse Navigators Direct Office Dial: [phone]   For scheduling needs, including cancellations and rescheduling, please call [place], [phone].   Follow-Up: At [facility], you and your health needs are our priority.  As part of our continuing mission to provide you with exceptional heart care, our providers are all part of one team.  This team includes your primary Cardiologist (physician) and Advanced Practice Providers or APPs (Physician Assistants and Nurse Practitioners) who all work together to provide you with the care you need, when you need it.  Your next appointment:   6 week(s)  Provider:   Jon Hails, PA-C          We recommend signing up for the patient portal called MyChart.  Sign up information is provided on this After Visit Summary.  MyChart is used to connect with patients for Virtual Visits  (Telemedicine).  Patients are able to view lab/test results, encounter notes, upcoming appointments, etc.  Non-urgent messages can be sent to your provider as well.   To learn more about what you can do with MyChart, go to [email or web address].   Other Instructions        Signed, Jon Nat Hails, [place]  07/01/2024 12:08 PM    [facility]

## 2024-07-01 NOTE — Patient Instructions (Addendum)
 Medication Instructions:  Your physician has recommended you make the following change in your medication:   START Aspirin  81 mg taking 1 daily  START Nitroglycerin  0.4 s/l tablet.. use as directed.. The proper use and anticipated side effects of nitroglycerine has been carefully explained.  If a single episode of chest pain is not relieved by one tablet, the patient will try another within 5 minutes; and if this doesn't relieve the pain, the patient is instructed to call 911 for transportation to an emergency department.   *If you need a refill on your cardiac medications before your next appointment, please call your pharmacy*  Lab Work: None ordered  If you have labs (blood work) drawn today and your tests are completely normal, you will receive your results only by: MyChart Message (if you have MyChart) OR A paper copy in the mail If you have any lab test that is abnormal or we need to change your treatment, we will call you to review the results.  Testing/Procedures: Your physician has requested that you have an echocardiogram. Echocardiography is a painless test that uses sound waves to create images of your heart. It provides your doctor with information about the size and shape of your heart and how well your heart's chambers and valves are working. This procedure takes approximately one hour. There are no restrictions for this procedure. Please do NOT wear cologne, perfume, aftershave, or lotions (deodorant is allowed). Please arrive 15 minutes prior to your appointment time.  Please note: We ask at that you not bring children with you during ultrasound (echo/ vascular) testing. Due to room size and safety concerns, children are not allowed in the ultrasound rooms during exams. Our front office staff cannot provide observation of children in our lobby area while testing is being conducted. An adult accompanying a patient to their appointment will only be allowed in the ultrasound room  at the discretion of the ultrasound technician under special circumstances. We apologize for any inconvenience.    Your physician has requested that you have cardiac CT. Cardiac computed tomography (CT) is a painless test that uses an x-ray machine to take clear, detailed pictures of your heart. For further information please visit [email or web address] Please follow instruction sheet BELOW:     Your cardiac CT will be scheduled at one of the below locations:   [facility] [address] [place], [place] [address] [phone] (Severe contrast allergies only)  OR   [facility] [address] [place], [place] [address] [phone]  OR   [facility] [address] [place], [place] [address] [phone]  OR   [facility] and Vascular Tower [address]  [place], [place] [address] [phone]  OR   [facility] [address] [place], [place] [phone]  If scheduled at [facility], please arrive at the [facility] and Children's Entrance (Entrance C2) of [facility] 30 minutes prior to test start time. You can use the FREE valet parking offered at entrance C (encouraged to control the heart rate for the test)  Proceed to the [facility] Radiology Department (first floor) to check-in and test prep.  All radiology patients and guests should use entrance C2 at [facility], accessed from [facility], even though the hospital's physical address listed is [address].  If scheduled at the Heart and Vascular Tower at [address], please enter the parking lot using the [address]  entrance and use the FREE valet service at the patient drop-off area. Enter the building and check-in with registration on the main floor.  If scheduled at [facility], please arrive to the Heart and Vascular Center 15 mins early for check-in and test  prep.  There is spacious parking and easy access to the radiology department from the [facility] V[facility] entrance. Please enter here and check-in with the desk attendant.   If scheduled at [facility], please arrive 30 minutes early for check-in and test prep.  Please follow these instructions carefully (unless otherwise directed):  An IV will be required for this test and Nitroglycerin  will be given.    On the Night Before the Test: Be sure to Drink plenty of water. Do not consume any caffeinated/decaffeinated beverages or chocolate 12 hours prior to your test. Do not take any antihistamines 12 hours prior to your test.    On the Day of the Test: Drink plenty of water until 1 hour prior to the test. Do not eat any food 1 hour prior to test. You may take your regular medications prior to the test.  Take metoprolol  (Lopressor ) 100 MG two hours prior to test. THIS HAS BEEN SENT TO [facility]- please wear underwire-free bra if available, avoid dresses & tight clothing     After the Test: Drink plenty of water. After receiving IV contrast, you may experience a mild flushed feeling. This is normal. On occasion, you may experience a mild rash up to 24 hours after the test. This is not dangerous. If this occurs, you can take Benadryl  25 mg, Zyrtec , Claritin , or Allegra and increase your fluid intake. (Patients taking Tikosyn should avoid Benadryl , and may take Zyrtec , Claritin , or Allegra) If you experience trouble breathing, this can be serious. If it is severe call 911 IMMEDIATELY. If it is mild, please call our office.  We will call to schedule your test 2-4 weeks out understanding that some insurance companies will need an authorization prior to the service being performed.   For more information and frequently asked questions, please visit our website : [email or web address]  For non-scheduling related questions, please contact the cardiac imaging  nurse navigator should you have any questions/concerns: Cardiac Imaging Nurse Navigators Direct Office Dial: [phone]   For scheduling needs, including cancellations and rescheduling, please call [place], [phone].   Follow-Up: At [facility], you and your health needs are our priority.  As part of our continuing mission to provide you with exceptional heart care, our providers are all part of one team.  This team includes your primary Cardiologist (physician) and Advanced Practice Providers or APPs (Physician Assistants and Nurse Practitioners) who all work together to provide you with the care you need, when you need it.  Your next appointment:   6 week(s)  Provider:   Jon Hails, PA-C          We recommend signing up for the patient portal called MyChart.  Sign up information is provided on this After Visit Summary.  MyChart is used to connect with patients for Virtual Visits (Telemedicine).  Patients are able to view lab/test results, encounter notes, upcoming appointments, etc.  Non-urgent messages can be sent to your provider as well.   To learn more about what you can do with MyChart, go to [email or web address].   Other Instructions

## 2024-07-01 NOTE — Telephone Encounter (Signed)
 Please call and schedule for a hospital follow up.  Thank you.

## 2024-07-01 NOTE — Telephone Encounter (Signed)
 ATC x2.  Left detailed message per DPR to return call to schedule f/u with Dr. Meade.  Will await return call.  When she returns call, please schedule her with Dr. Desai for a hospital follow up.  Thank you.

## 2024-07-02 ENCOUNTER — Encounter (HOSPITAL_BASED_OUTPATIENT_CLINIC_OR_DEPARTMENT_OTHER): Payer: Self-pay

## 2024-07-02 ENCOUNTER — Ambulatory Visit (HOSPITAL_BASED_OUTPATIENT_CLINIC_OR_DEPARTMENT_OTHER)

## 2024-07-02 DIAGNOSIS — R262 Difficulty in walking, not elsewhere classified: Secondary | ICD-10-CM

## 2024-07-02 DIAGNOSIS — M6281 Muscle weakness (generalized): Secondary | ICD-10-CM

## 2024-07-02 DIAGNOSIS — M25672 Stiffness of left ankle, not elsewhere classified: Secondary | ICD-10-CM | POA: Diagnosis not present

## 2024-07-02 DIAGNOSIS — M25572 Pain in left ankle and joints of left foot: Secondary | ICD-10-CM

## 2024-07-02 DIAGNOSIS — M5459 Other low back pain: Secondary | ICD-10-CM

## 2024-07-02 NOTE — Therapy (Signed)
 OUTPATIENT PHYSICAL THERAPY THORACIC TREATMENT    Patient Name: Haley White MRN: [id number] DOB:1956/07/27, 68 y.o., female Today's Date: 07/02/2024  END OF SESSION:    PT End of Session - 07/02/24 1306     Visit Number 45    Number of Visits 54    Date for PT Re-Evaluation 08/03/24    Authorization Type aetna mcr    Progress Note Due on Visit 49    PT Start Time 1304    PT Stop Time 1345    PT Time Calculation (min) 41 min    Activity Tolerance Patient tolerated treatment well;Patient limited by pain    Behavior During Therapy WFL for tasks assessed/performed                Past Medical History:  Diagnosis Date   Anxiety    Arthritis    Asthma    Cataract    Depression    Gout    Hyperlipidemia    Hypertension    Obese    Sleep apnea    Past Surgical History:  Procedure Laterality Date   ANKLE SURGERY Left    APPENDECTOMY  1984   APPENDECTOMY     CATARACT EXTRACTION W/ INTRAOCULAR LENS  IMPLANT, BILATERAL  2014   CESAREAN SECTION  1984, 1987, 1994   X3   CESAREAN SECTION     x3   COLONOSCOPY  11/16/2013   w/Brodie    EXTERNAL FIXATION LEG Left 06/04/2023   Procedure: EXTERNAL FIXATION LEG;  Surgeon: Barton Drape, MD;  Location: [facility];  Service: Orthopedics;  Laterality: Left;   EYE SURGERY Bilateral    lasix   FOOT FRACTURE SURGERY Left    FOOT SURGERY Left 2002   FRACTURE SURGERY Left    left ankle   HARDWARE REMOVAL Left 12/03/2023   Procedure: HARDWARE REMOVAL OF SYNDEMOSIS;  Surgeon: Barton Drape, MD;  Location: [facility];  Service: Orthopedics;  Laterality: Left;   KNEE SURGERY Left 1998   ORIF ANKLE FRACTURE Left 06/04/2023   Procedure: OPEN REDUCTION INTERNAL FIXATION (ORIF) ANKLE FRACTURE vs EX FIX;  Surgeon: Barton Drape, MD;  Location: [facility];  Service: Orthopedics;  Laterality: Left;   ROTATOR CUFF REPAIR Right 2007   shoulder     torn rotator cuff   TUBAL LIGATION  1994   TUBAL LIGATION      Patient Active Problem List   Diagnosis Date Noted   Neck pain 05/31/2024   Symptomatic mammary hypertrophy 05/31/2024   Prediabetes 04/22/2024   History of thromboembolism 04/22/2024   OSA on CPAP 08/11/2023   Ankle fracture 06/03/2023   Trimalleolar fracture of left ankle 06/02/2023   Acute left ankle pain 06/02/2023   Sleep-related hypoxia 03/24/2023   At risk for obstructive sleep apnea 03/24/2023   Acute pulmonary embolism (HCC) 05/10/2022   Allergic rhinitis 03/15/2021   Gastro-esophageal reflux disease without esophagitis 03/15/2021   Mild persistent asthma, uncomplicated 03/15/2021   Aortic atherosclerosis (HCC) by Chest CT scan on 02/02/2018  01/10/2021   Asthma    Back pain 03/17/2019   Lumbosacral spondylosis without myelopathy 03/17/2019   Pseudogout 12/09/2017   Major depressive disorder, recurrent, severe without psychotic features (HCC)    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   Suicide attempt by drug ingestion (HCC)    Vitamin D  deficiency 08/22/2014   Hyperlipidemia, mixed 08/22/2014   Hypertension    Anxiety    Obesity, unspecified     PCP: Elsie Richards MD  REFERRING PROVIDER:  Barton Drape, MD (for ankle) Laqueta Sharper, MD (for low back)  REFERRING DIAG: [phone] (ICD-10-CM) - Displaced trimalleolar fracture of left lower leg, subsequent encounter for closed fracture with routine healing   THERAPY DIAG:  Stiffness of left ankle, not elsewhere classified  Muscle weakness (generalized)  Difficulty in walking, not elsewhere classified  Pain in left ankle and joints of left foot  Other low back pain  Rationale for Evaluation and Treatment: Rehabilitation  ONSET DATE: July 2024  SUBJECTIVE:   SUBJECTIVE STATEMENT:  Pt when to ED 2 days ago for chest pain with inhalation going on 2 weeks. Had called pulmonologist first who instructed her to go to ED. Tests have came back normal thus far. Pt reports results of US  showed no blood  clot. Planning to get in with MD    PERTINENT HISTORY: The patient is s/p left ankle trimalleolar & syndesmosis ORIF, DOS: 06/05/2023.  Removal of hardware on 04/26/24.  Removal of deep hardware on 12/03/2023.   Hx of foot/ankle surgery in 2022  Fibromyalgia, HTN, PE s/p covid in 2023 LBP with MRI (10/2022) evidence of chronic mild anterior wedge compression fracture of T12 and chronic anterior wedge compression deformity of L1   PAIN:  Are you having pain? Yes:  NPRS scale: 5/10 upper back and lower back 3-4/10, very tight, standing too long/walking too much makes it feel worse, cupping makes it feel better as does heat  Otherwise general fibro pain     PRECAUTIONS: None   WEIGHT BEARING RESTRICTIONS: WBAT  FALLS:  Has patient fallen in last 6 months? Yes. Number of falls 1 fell down wet hill walking dog  LIVING ENVIRONMENT: Lives with: lives alone Lives in: House/apartment Stairs: Yes: External: 3 steps; none Has following equipment at home: Single point cane   OCCUPATION: retired   PLOF: Independent  PATIENT GOALS: walk normally and as pain free as able  NEXT MD VISIT: June 2025  OBJECTIVE:  Note: Objective measures were completed at Evaluation unless otherwise noted.  MRI IMPRESSION: 1. No acute findings or clear explanation for the patient's symptoms. Overall findings are similar to previous CT from 2020. 2. Interval development of a healed mild superior endplate compression deformity at L1 since 2020. No acute osseous findings. 3. Multilevel spondylosis as described, most advanced at L4-5 where there is mild spinal stenosis and asymmetric lateral recess and foraminal narrowing on the left with suspected chronic left L4 nerve root encroachment. 4. Mild asymmetric lateral recess and foraminal narrowing on the left at L5-S1 without definite nerve root encroachment.   LOWER EXTREMITY ROM:   Active ROM Right eval Left eval Left  11/17/23 Right 2/28  Left 2/28 AROM/PROM Left 4/21 AROM  L 5/12  L 6/19 L 6/30 L 8/14 L 06/21/24  Hip flexion                  Hip extension                  Hip abduction                  Hip adduction                  Hip internal rotation                  Hip external rotation                  Knee flexion  Knee extension                  Ankle dorsiflexion   -8 Lacking 2 14 Lacking 7 deg / 3  with pain Lacking 1 degree/pain with AAROM  4 1 (calf tightness) 10deg 6 10*  Ankle plantarflexion   30d 42 65 55 65deg 65 54 55 51 48*  Ankle inversion   Limited by 50%   43 24 12 17 22 24 25  19*  Ankle eversion   Limited by 50%   10 2 with pain 12 5 pain 11 15 11  12*   (Blank rows = not tested)  LEFS 4/21: 14/80 LEFS: Lower Extremity Functional Score: 30 / 80 = 37.5 % ODI Score = 17 points (34%)  LEFS 6/19: 31/80 6/19 Modified Owsestry: 50% 8/14 MODI: 30% 8/`4: LEFS:24/80   MMT 4+ throughout L ankle with pain into EV MMT 6/19: 5/5 DF and PF, 4+/5 In,ev.  Hip flexion 11.1lbs R, 17.7, abduction 11.1R, 19L (seated)  06/21/24  Ankle DF L 4+/5, inversion 4+/5, eversion 4+/5 Shoulder strength: flexion 4+/5 B, ABD 4+/5     Back/neck ROM 06/21/24  Cervical AROM: extension about 25% limited, flexion full but painful in collar bones, lateral flexion at least 50% limited B, rotation about 50% limited B  Thoracic AROM: flexion 90% limited, extension WNL but painful/some lumbar popping, lateral flexion 50% limited B left more painful, rotation 50% limited B Lumbar AROM: 50% limited, extension 90% limited painful, lateral flexion R WNL L 50% limited, rotation R 50% limited L 80% limited  Rounded shoulders, thoracic kyphosis noted     TODAY'S TREATMENT:    07/02/24: Seated UT stretch 2x30 seconds B -Posterior rols (pain in ACJ) -Levator stretching 2x20sec x2ea -Seated bil ER RTB 2x10 -Seated horizontal abduction RTB  2x10 Seated row GTB x20 -seated W 2x10  06/29/24  UBE L2.5  x4 min forward/4 min backward    Seated UT stretch 2x30 seconds B Levator stretch 1x30 seconds B  SCM stretch 1x30 seconds B  Scap retractions 12x3 seconds  Thoracic flexion + rotation stretch 10x3 seconds B Thoracic extension + BUE ER for pec stretch x10  Modified QL stretch 3x10 seconds B  TA sets seated 15x2 seconds  Backward shoulder rolls x15   06/25/24 -STM (gentle) to post tib and peroneal area -Gentle PROM L ankle -Ankle AROM x10ea -Standing row GTB 2x10 (required seated break in between due to foot/ankle pain) -Seated bil ER RTB x10 -Seated horizontal abduction RT x10 -seated wall angel x10 -HPE update/review -Education and instruction on desensitization techniques and self STM. Education on postural awareness and correction with daily ativities  06/21/24  Thoracic eval, re-measured ankle and lumbar for cert/POC   Education on findings, POC moving forward in context of upcoming breast reduction surgery, HEP for thoracic region and neck  UBE L2.5 x3 min forward, 3 min backward  Thoracic rotation ROM Thoracic flexion + rotation stretch Thoracic/lumbar extension stretch     06/19/24 Manual Therapy:  STM to L plantar fascia   - Cross friction massage to tarsal tunnel area with no tolerance due to hypersensitvity - STM to posterior popliteal region due to possible compression of sciatic nerve.  - Game ready to address swelling and nerve compression in tarsal tunnel.  - Discussion about possible adverse reactions to medications, tarsal tunnel syndrome.       PATIENT EDUCATION:  Education details:  exercise form, relevant anatomy, POC, and rationale of interventions. Person educated: Patient Education  method: Explanation, Demonstration, Tactile cues, Verbal cues Education comprehension: verbalized understanding, returned demonstration, verbal cues required, tactile cues required, and needs further education     HOME EXERCISE PROGRAM: Access Code:  [email or web address] URL: [email or web address] Date: 06/22/24 Prepared by: Josette Rough      ASSESSMENT:  CLINICAL IMPRESSION:    Pt had catching in Left AC joint with posterior shoulder rolls. Tightness palpated in levator scapulae so continued with stretching to address this. Discussed switching to small backpack as pt reports carrying purse on that side. Added scapular depression isometric with good tolerance. Cuing required with postural re-ed for proper scapular position. Discussed postural awareness with ADLS.   Pt continues with ongoing swelling and pain in dorsal foot. Very sensitive even in seated position. Focused on postural re-ed interventions today vs foot and ankle due to pain level/hypersensitivity.  Pt will see ankle/foot doctor soon for f/u to discuss symptoms.         OBJECTIVE IMPAIRMENTS: Abnormal gait, decreased activity tolerance, decreased balance, difficulty walking, decreased ROM, decreased strength, obesity, and pain.   ACTIVITY LIMITATIONS: squatting, stairs, and locomotion level  PARTICIPATION LIMITATIONS: shopping and community activity  PERSONAL FACTORS: Past/current experiences and 1 comorbidity: back dysfunction are also affecting patient's functional outcome.   REHAB POTENTIAL: Good  CLINICAL DECISION MAKING: Evolving/moderate complexity  EVALUATION COMPLEXITY: Moderate   GOALS:  SHORT TERM GOALS: Target date: 07/13/2024   Pt will tolerate full aquatic sessions consistently without increase in pain and with improving function to demonstrate good toleration and effectiveness of intervention.  Baseline: Goal status: Met 11/24/23  2.  Pt will navigate stair into and out of pool 6 steps using step through pattern Baseline: step-to pattern Goal status: MET   3.  Pt will report reduction in pain by 3 NPRS post aquatic session which lasts for up to 24 hours. Baseline:  Goal status: Met 11/24/23  4.  Pt will amb without antalgic gait pattern  submerged in 3.6 ft Baseline:  Goal status:MET - 11/17/23  5.  Pt will demo improved quality of gait including increased toe off and stance time on L LE with reduced favoring of L LE. Goal status: ongoing 06/22/24 Target date:    6.  Pt will demo improved L ankle AROM to at least 0 deg in DF and 7 deg in Eversion for improved stiffness and gait.  Goal status: MET 6/19 Target date:  01/23/2024     LONG TERM GOALS: Target date: 08/03/2024      Pt will demonstrate an improvement in LEFS by at least 9 points for a clinically significant improvement in self perceived disability.  Baseline: 30/80 Goal status: MET  2.  Pt wiil navigate 1 flight of steps using step alternating pattern indep Baseline:  Goal status: ongoing 06/22/24  3.  Pt will demonstrate improved DF AROM to be w/n 5 deg of R ankle for improved stiffness, gait, and performance of stairs.  Baseline: see chart Goal status: MET 7/17  4.  Pt will demo left ankle strength to be at least 4/5 in DF and WFL in PF (tested with manual resistance in open chain) in order for improved performance of and tolerance with functional mobility Baseline: see chart Goal status:  MET  5.  Pt will amb without AD without limitation to pain Baseline:using cane or walker  Goal status: MET 7/17  6.  Pt will have improved Oswestry score to >/= 43/50 to demo MCID Baseline: Oswestry Score: 21 / 50 or 42 % Goal  status: ongoing (25/50 on 6/19)  7.  Pt will be able to tolerate standing tasks for >/=15 min for household chores/tasks Baseline: 2 min Goal status: MET (max 7/17)  8.  Pt will be able to demo increased hip strength to at least 4/5 for improved standing tolerance Baseline: See MMT above Goal status: ongoing   9. Thoracic and lumbar AROM to be no more than 25% limited all planes of motion Goal status: NEW  10. Will demonstrate improved awareness of functional posture Goal status: NEW  11. Periscap muscle strength (middle  trap, lower trap, etc) to be at least 4/5 Goal status: NEW    PLAN:  PT FREQUENCY: 2x/wk  PT DURATION: 6 weeks     PLANNED INTERVENTIONS: 97164- PT Re-evaluation, 97110-Therapeutic exercises, 97530- Therapeutic activity, 97112- Neuromuscular re-education, 97535- Self Care, 02859- Manual therapy, [phone]- Gait training, [phone]- Orthotic Fit/training, [phone]- Aquatic Therapy, Patient/Family education, Balance training, Stair training, Taping, Dry Needling, Joint mobilization, DME instructions, Cryotherapy, and Moist heat  PLAN FOR NEXT SESSION:  Cont with ankle ROM.  Gait training.  Continue gross hip and core strengthening. Soft tissue work to lumbar paraspinals.  Cont cupping to lumbar paraspinals. Thoraco-cervical interventions, postural training.   Asberry Rodes, PTA   07/02/24 2:46 PM

## 2024-07-05 NOTE — Therapy (Signed)
 OUTPATIENT PHYSICAL THERAPY THORACIC TREATMENT    Patient Name: Haley White MRN: [id number] DOB:[birth year], 68 y.o., female Today's Date: 07/07/2024  END OF SESSION:    PT End of Session - 07/06/24 1500     Visit Number 46    Number of Visits 54    Date for PT Re-Evaluation 08/03/24    Authorization Type aetna mcr    PT Start Time 1455    PT Stop Time 1534    PT Time Calculation (min) 39 min    Activity Tolerance Patient tolerated treatment well    Behavior During Therapy WFL for tasks assessed/performed                 Past Medical History:  Diagnosis Date   Anxiety    Arthritis    Asthma    Cataract    Depression    Gout    Hyperlipidemia    Hypertension    Obese    Sleep apnea    Past Surgical History:  Procedure Laterality Date   ANKLE SURGERY Left    APPENDECTOMY  1984   APPENDECTOMY     CATARACT EXTRACTION W/ INTRAOCULAR LENS  IMPLANT, BILATERAL  2014   CESAREAN SECTION  1984, 1987, 1994   X3   CESAREAN SECTION     x3   COLONOSCOPY  11/16/2013   w/Brodie    EXTERNAL FIXATION LEG Left 06/04/2023   Procedure: EXTERNAL FIXATION LEG;  Surgeon: Barton Drape, MD;  Location: [facility];  Service: Orthopedics;  Laterality: Left;   EYE SURGERY Bilateral    lasix   FOOT FRACTURE SURGERY Left    FOOT SURGERY Left 2002   FRACTURE SURGERY Left    left ankle   HARDWARE REMOVAL Left 12/03/2023   Procedure: HARDWARE REMOVAL OF SYNDEMOSIS;  Surgeon: Barton Drape, MD;  Location: [facility];  Service: Orthopedics;  Laterality: Left;   KNEE SURGERY Left 1998   ORIF ANKLE FRACTURE Left 06/04/2023   Procedure: OPEN REDUCTION INTERNAL FIXATION (ORIF) ANKLE FRACTURE vs EX FIX;  Surgeon: Barton Drape, MD;  Location: [facility];  Service: Orthopedics;  Laterality: Left;   ROTATOR CUFF REPAIR Right 2007   shoulder     torn rotator cuff   TUBAL LIGATION  1994   TUBAL LIGATION     Patient Active Problem List   Diagnosis Date Noted   Neck  pain 05/31/2024   Symptomatic mammary hypertrophy 05/31/2024   Prediabetes 04/22/2024   History of thromboembolism 04/22/2024   OSA on CPAP 08/11/2023   Ankle fracture 06/03/2023   Trimalleolar fracture of left ankle 06/02/2023   Acute left ankle pain 06/02/2023   Sleep-related hypoxia 03/24/2023   At risk for obstructive sleep apnea 03/24/2023   Acute pulmonary embolism (HCC) 05/10/2022   Allergic rhinitis 03/15/2021   Gastro-esophageal reflux disease without esophagitis 03/15/2021   Mild persistent asthma, uncomplicated 03/15/2021   Aortic atherosclerosis (HCC) by Chest CT scan on 02/02/2018  01/10/2021   Asthma    Back pain 03/17/2019   Lumbosacral spondylosis without myelopathy 03/17/2019   Pseudogout 12/09/2017   Major depressive disorder, recurrent, severe without psychotic features (HCC)    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   Suicide attempt by drug ingestion (HCC)    Vitamin D  deficiency 08/22/2014   Hyperlipidemia, mixed 08/22/2014   Hypertension    Anxiety    Obesity, unspecified     PCP: Elsie Richards MD  REFERRING PROVIDER: Barton Drape, MD (for ankle) Laqueta Sharper, MD (for low back)  REFERRING DIAG: S82.852D (ICD-10-CM) - Displaced trimalleolar fracture of left lower leg, subsequent encounter for closed fracture with routine healing   THERAPY DIAG:  Stiffness of left ankle, not elsewhere classified  Muscle weakness (generalized)  Pain in left ankle and joints of left foot  Difficulty in walking, not elsewhere classified  Other low back pain  Pain in thoracic spine  Rationale for Evaluation and Treatment: Rehabilitation  ONSET DATE: July 2024  SUBJECTIVE:   SUBJECTIVE STATEMENT:  Pt reports her foot/ankle swelling has been getting worse slowly over time though more increased over the past week.  Pt had testing last week which was negative for a blood clot.  Pt is having a cardiac CT on Thursday.  Pt states her back is doing better  though still has pain.     PERTINENT HISTORY: The patient is s/p left ankle trimalleolar & syndesmosis ORIF, DOS: 06/05/2023.  Removal of hardware on 04/26/24.  Removal of deep hardware on 12/03/2023.   Hx of foot/ankle surgery in 2022  Fibromyalgia, HTN, PE s/p covid in 2023 LBP with MRI (10/2022) evidence of chronic mild anterior wedge compression fracture of T12 and chronic anterior wedge compression deformity of L1   PAIN:  Are you having pain? Yes:  NPRS scale: 0/10 thoracic and lumbar pain currently ; 6/10 L ankle/foot pain    PRECAUTIONS: None   WEIGHT BEARING RESTRICTIONS: WBAT  FALLS:  Has patient fallen in last 6 months? Yes. Number of falls 1 fell down wet hill walking dog  LIVING ENVIRONMENT: Lives with: lives alone Lives in: House/apartment Stairs: Yes: External: 3 steps; none Has following equipment at home: Single point cane   OCCUPATION: retired   PLOF: Independent  PATIENT GOALS: walk normally and as pain free as able  NEXT MD VISIT: June 2025  OBJECTIVE:  Note: Objective measures were completed at Evaluation unless otherwise noted.  MRI IMPRESSION: 1. No acute findings or clear explanation for the patient's symptoms. Overall findings are similar to previous CT from 2020. 2. Interval development of a healed mild superior endplate compression deformity at L1 since 2020. No acute osseous findings. 3. Multilevel spondylosis as described, most advanced at L4-5 where there is mild spinal stenosis and asymmetric lateral recess and foraminal narrowing on the left with suspected chronic left L4 nerve root encroachment. 4. Mild asymmetric lateral recess and foraminal narrowing on the left at L5-S1 without definite nerve root encroachment.   LOWER EXTREMITY ROM:   Active ROM Right eval Left eval Left  11/17/23 Right 2/28 Left 2/28 AROM/PROM Left 4/21 AROM  L 5/12  L 6/19 L 6/30 L 8/14 L 06/21/24  Hip flexion                  Hip extension                   Hip abduction                  Hip adduction                  Hip internal rotation                  Hip external rotation                  Knee flexion                  Knee extension  Ankle dorsiflexion   -8 Lacking 2 14 Lacking 7 deg / 3  with pain Lacking 1 degree/pain with AAROM  4 1 (calf tightness) 10deg 6 10*  Ankle plantarflexion   30d 42 65 55 65deg 65 54 55 51 48*  Ankle inversion   Limited by 50%   43 24 12 17 22 24 25  19*  Ankle eversion   Limited by 50%   10 2 with pain 12 5 pain 11 15 11  12*   (Blank rows = not tested)  LEFS 4/21: 14/80 LEFS: Lower Extremity Functional Score: 30 / 80 = 37.5 % ODI Score = 17 points (34%)  LEFS 6/19: 31/80 6/19 Modified Owsestry: 50% 8/14 MODI: 30% 8/`4: LEFS:24/80   MMT 4+ throughout L ankle with pain into EV MMT 6/19: 5/5 DF and PF, 4+/5 In,ev.  Hip flexion 11.1lbs R, 17.7, abduction 11.1R, 19L (seated)  06/21/24  Ankle DF L 4+/5, inversion 4+/5, eversion 4+/5 Shoulder strength: flexion 4+/5 B, ABD 4+/5     Back/neck ROM 06/21/24  Cervical AROM: extension about 25% limited, flexion full but painful in collar bones, lateral flexion at least 50% limited B, rotation about 50% limited B  Thoracic AROM: flexion 90% limited, extension WNL but painful/some lumbar popping, lateral flexion 50% limited B left more painful, rotation 50% limited B Lumbar AROM: 50% limited, extension 90% limited painful, lateral flexion R WNL L 50% limited, rotation R 50% limited L 80% limited  Rounded shoulders, thoracic kyphosis noted     TODAY'S TREATMENT:   07/06/24: Pt received L ankle DF, PF, Inv, and Eve PROM per pt and tissue tolerance. Ankle Inv/Eve on half roll Wobble board F/B seated  Seated row GTB 2x10 Seated ER with retraction with RTB 2x10 Seated shoulder hz abd with RTB x 10 Seated UT stretch 2x30 sec bilat Seated LS stretch 2x30 sec bilat Posterior shoulder rolls approx 5 reps (Pt had pain) Seated heel  raises 2x10  07/02/24: Seated UT stretch 2x30 seconds B -Posterior rols (pain in ACJ) -Levator stretching 2x20sec x2ea -Seated bil ER RTB 2x10 -Seated horizontal abduction RTB  2x10 Seated row GTB x20 -seated W 2x10  06/29/24  UBE L2.5 x4 min forward/4 min backward    Seated UT stretch 2x30 seconds B Levator stretch 1x30 seconds B  SCM stretch 1x30 seconds B  Scap retractions 12x3 seconds  Thoracic flexion + rotation stretch 10x3 seconds B Thoracic extension + BUE ER for pec stretch x10  Modified QL stretch 3x10 seconds B  TA sets seated 15x2 seconds  Backward shoulder rolls x15   06/25/24 -STM (gentle) to post tib and peroneal area -Gentle PROM L ankle -Ankle AROM x10ea -Standing row GTB 2x10 (required seated break in between due to foot/ankle pain) -Seated bil ER RTB x10 -Seated horizontal abduction RT x10 -seated wall angel x10 -HPE update/review -Education and instruction on desensitization techniques and self STM. Education on postural awareness and correction with daily ativities  06/21/24  Thoracic eval, re-measured ankle and lumbar for cert/POC   Education on findings, POC moving forward in context of upcoming breast reduction surgery, HEP for thoracic region and neck  UBE L2.5 x3 min forward, 3 min backward  Thoracic rotation ROM Thoracic flexion + rotation stretch Thoracic/lumbar extension stretch     06/19/24 Manual Therapy:  STM to L plantar fascia   - Cross friction massage to tarsal tunnel area with no tolerance due to hypersensitvity - STM to posterior popliteal region due to possible compression of sciatic  nerve.  - Game ready to address swelling and nerve compression in tarsal tunnel.  - Discussion about possible adverse reactions to medications, tarsal tunnel syndrome.      PATIENT EDUCATION:  Education details:  exercise form, relevant anatomy, POC, and rationale of interventions. Person educated: Patient Education method:  Explanation, Demonstration, Tactile cues, Verbal cues Education comprehension: verbalized understanding, returned demonstration, verbal cues required, tactile cues required, and needs further education     HOME EXERCISE PROGRAM: Access Code: [email or web address] URL: [email or web address] Date: 06/22/24 Prepared by: Josette Rough      ASSESSMENT:  CLINICAL IMPRESSION:    Pt states her back is doing better though still has pain.  The cyst on the dorsal surface of her L foot appears to be larger and she has increased swelling in L foot and ankle.  Pt was limited in DF on wobble board due to her cyst.  PT instructed pt in correct form and appropriate range with cervical stretches including to not stretch into a painful range.  Pt reports having pain with posterior shoulder rolls and PT had pt stop that exercise.  Pt reports no change in ankle/foot pain after treatment.        OBJECTIVE IMPAIRMENTS: Abnormal gait, decreased activity tolerance, decreased balance, difficulty walking, decreased ROM, decreased strength, obesity, and pain.   ACTIVITY LIMITATIONS: squatting, stairs, and locomotion level  PARTICIPATION LIMITATIONS: shopping and community activity  PERSONAL FACTORS: Past/current experiences and 1 comorbidity: back dysfunction are also affecting patient's functional outcome.   REHAB POTENTIAL: Good  CLINICAL DECISION MAKING: Evolving/moderate complexity  EVALUATION COMPLEXITY: Moderate   GOALS:  SHORT TERM GOALS: Target date: 07/13/2024   Pt will tolerate full aquatic sessions consistently without increase in pain and with improving function to demonstrate good toleration and effectiveness of intervention.  Baseline: Goal status: Met 11/24/23  2.  Pt will navigate stair into and out of pool 6 steps using step through pattern Baseline: step-to pattern Goal status: MET   3.  Pt will report reduction in pain by 3 NPRS post aquatic session which lasts for up to 24  hours. Baseline:  Goal status: Met 11/24/23  4.  Pt will amb without antalgic gait pattern submerged in 3.6 ft Baseline:  Goal status:MET - 11/17/23  5.  Pt will demo improved quality of gait including increased toe off and stance time on L LE with reduced favoring of L LE. Goal status: ongoing 06/22/24 Target date:    6.  Pt will demo improved L ankle AROM to at least 0 deg in DF and 7 deg in Eversion for improved stiffness and gait.  Goal status: MET 6/19 Target date:  01/23/2024     LONG TERM GOALS: Target date: 08/03/2024      Pt will demonstrate an improvement in LEFS by at least 9 points for a clinically significant improvement in self perceived disability.  Baseline: 30/80 Goal status: MET  2.  Pt wiil navigate 1 flight of steps using step alternating pattern indep Baseline:  Goal status: ongoing 06/22/24  3.  Pt will demonstrate improved DF AROM to be w/n 5 deg of R ankle for improved stiffness, gait, and performance of stairs.  Baseline: see chart Goal status: MET 7/17  4.  Pt will demo left ankle strength to be at least 4/5 in DF and WFL in PF (tested with manual resistance in open chain) in order for improved performance of and tolerance with functional mobility Baseline: see chart Goal status:  MET  5.  Pt will amb without AD without limitation to pain Baseline:using cane or walker  Goal status: MET 7/17  6.  Pt will have improved Oswestry score to >/= 43/50 to demo MCID Baseline: Oswestry Score: 21 / 50 or 42 % Goal status: ongoing (25/50 on 6/19)  7.  Pt will be able to tolerate standing tasks for >/=15 min for household chores/tasks Baseline: 2 min Goal status: MET (max 7/17)  8.  Pt will be able to demo increased hip strength to at least 4/5 for improved standing tolerance Baseline: See MMT above Goal status: ongoing   9. Thoracic and lumbar AROM to be no more than 25% limited all planes of motion Goal status: NEW  10. Will demonstrate  improved awareness of functional posture Goal status: NEW  11. Periscap muscle strength (middle trap, lower trap, etc) to be at least 4/5 Goal status: NEW    PLAN:  PT FREQUENCY: 2x/wk  PT DURATION: 6 weeks     PLANNED INTERVENTIONS: [id number]- PT Re-evaluation, 97110-Therapeutic exercises, 97530- Therapeutic activity, 97112- Neuromuscular re-education, 97535- Self Care, [id number]- Manual therapy, [phone]- Gait training, [phone]- Orthotic Fit/training, [phone]- Aquatic Therapy, Patient/Family education, Balance training, Stair training, Taping, Dry Needling, Joint mobilization, DME instructions, Cryotherapy, and Moist heat  PLAN FOR NEXT SESSION:  Cont with ankle ROM.  Gait training.  Continue gross hip and core strengthening. Soft tissue work to lumbar paraspinals.  Cont cupping to lumbar paraspinals. Thoraco-cervical interventions, postural training.   Leigh Minerva III PT, DPT 07/07/24 5:15 PM

## 2024-07-06 ENCOUNTER — Encounter (HOSPITAL_COMMUNITY): Payer: Self-pay

## 2024-07-06 ENCOUNTER — Ambulatory Visit (HOSPITAL_BASED_OUTPATIENT_CLINIC_OR_DEPARTMENT_OTHER): Attending: Orthopaedic Surgery | Admitting: Physical Therapy

## 2024-07-06 ENCOUNTER — Encounter (HOSPITAL_BASED_OUTPATIENT_CLINIC_OR_DEPARTMENT_OTHER): Payer: Self-pay | Admitting: Physical Therapy

## 2024-07-06 DIAGNOSIS — M546 Pain in thoracic spine: Secondary | ICD-10-CM | POA: Insufficient documentation

## 2024-07-06 DIAGNOSIS — M6281 Muscle weakness (generalized): Secondary | ICD-10-CM | POA: Diagnosis present

## 2024-07-06 DIAGNOSIS — M25672 Stiffness of left ankle, not elsewhere classified: Secondary | ICD-10-CM | POA: Insufficient documentation

## 2024-07-06 DIAGNOSIS — M25572 Pain in left ankle and joints of left foot: Secondary | ICD-10-CM | POA: Insufficient documentation

## 2024-07-06 DIAGNOSIS — R262 Difficulty in walking, not elsewhere classified: Secondary | ICD-10-CM | POA: Insufficient documentation

## 2024-07-06 DIAGNOSIS — M5459 Other low back pain: Secondary | ICD-10-CM | POA: Insufficient documentation

## 2024-07-08 ENCOUNTER — Ambulatory Visit (HOSPITAL_COMMUNITY)
Admission: RE | Admit: 2024-07-08 | Discharge: 2024-07-08 | Disposition: A | Discharge status: Home / Self Care | Source: Ambulatory Visit | Attending: Physician Assistant | Admitting: Physician Assistant

## 2024-07-08 DIAGNOSIS — R072 Precordial pain: Secondary | ICD-10-CM | POA: Insufficient documentation

## 2024-07-08 MED ORDER — NITROGLYCERIN 0.4 MG SL SUBL
0.8000 mg | SUBLINGUAL_TABLET | Freq: Once | SUBLINGUAL | Status: AC
  Administered 2024-07-08: 0.8 mg via SUBLINGUAL

## 2024-07-08 MED ORDER — IOHEXOL 350 MG/ML SOLN
100.0000 mL | Freq: Once | INTRAVENOUS | Status: AC | PRN
  Administered 2024-07-08: 100 mL via INTRAVENOUS

## 2024-07-09 ENCOUNTER — Ambulatory Visit (HOSPITAL_BASED_OUTPATIENT_CLINIC_OR_DEPARTMENT_OTHER)

## 2024-07-09 ENCOUNTER — Ambulatory Visit: Payer: Self-pay | Admitting: Physician Assistant

## 2024-07-09 ENCOUNTER — Encounter (HOSPITAL_BASED_OUTPATIENT_CLINIC_OR_DEPARTMENT_OTHER): Payer: Self-pay

## 2024-07-09 DIAGNOSIS — M5459 Other low back pain: Secondary | ICD-10-CM

## 2024-07-09 DIAGNOSIS — M25572 Pain in left ankle and joints of left foot: Secondary | ICD-10-CM

## 2024-07-09 DIAGNOSIS — R262 Difficulty in walking, not elsewhere classified: Secondary | ICD-10-CM

## 2024-07-09 DIAGNOSIS — M6281 Muscle weakness (generalized): Secondary | ICD-10-CM

## 2024-07-09 DIAGNOSIS — M25672 Stiffness of left ankle, not elsewhere classified: Secondary | ICD-10-CM

## 2024-07-09 NOTE — Therapy (Signed)
 OUTPATIENT PHYSICAL THERAPY THORACIC TREATMENT    Patient Name: Haley White MRN: [id number] DOB:05-09-1956, 68 y.o., female Today's Date: 07/09/2024  END OF SESSION:    PT End of Session - 07/09/24 1105     Visit Number 47    Number of Visits 54    Date for PT Re-Evaluation 08/03/24    Authorization Type aetna mcr    Progress Note Due on Visit 49    PT Start Time 1103    PT Stop Time 1144    PT Time Calculation (min) 41 min    Activity Tolerance Patient tolerated treatment well    Behavior During Therapy WFL for tasks assessed/performed                  Past Medical History:  Diagnosis Date   Anxiety    Arthritis    Asthma    Cataract    Depression    Gout    Hyperlipidemia    Hypertension    Obese    Sleep apnea    Past Surgical History:  Procedure Laterality Date   ANKLE SURGERY Left    APPENDECTOMY  1984   APPENDECTOMY     CATARACT EXTRACTION W/ INTRAOCULAR LENS  IMPLANT, BILATERAL  2014   CESAREAN SECTION  1984, 1987, 1994   X3   CESAREAN SECTION     x3   COLONOSCOPY  11/16/2013   w/Brodie    EXTERNAL FIXATION LEG Left 06/04/2023   Procedure: EXTERNAL FIXATION LEG;  Surgeon: Barton Drape, MD;  Location: [facility];  Service: Orthopedics;  Laterality: Left;   EYE SURGERY Bilateral    lasix   FOOT FRACTURE SURGERY Left    FOOT SURGERY Left 2002   FRACTURE SURGERY Left    left ankle   HARDWARE REMOVAL Left 12/03/2023   Procedure: HARDWARE REMOVAL OF SYNDEMOSIS;  Surgeon: Barton Drape, MD;  Location: [facility];  Service: Orthopedics;  Laterality: Left;   KNEE SURGERY Left 1998   ORIF ANKLE FRACTURE Left 06/04/2023   Procedure: OPEN REDUCTION INTERNAL FIXATION (ORIF) ANKLE FRACTURE vs EX FIX;  Surgeon: Barton Drape, MD;  Location: [facility];  Service: Orthopedics;  Laterality: Left;   ROTATOR CUFF REPAIR Right 2007   shoulder     torn rotator cuff   TUBAL LIGATION  1994   TUBAL LIGATION     Patient Active Problem  List   Diagnosis Date Noted   Neck pain 05/31/2024   Symptomatic mammary hypertrophy 05/31/2024   Prediabetes 04/22/2024   History of thromboembolism 04/22/2024   OSA on CPAP 08/11/2023   Ankle fracture 06/03/2023   Trimalleolar fracture of left ankle 06/02/2023   Acute left ankle pain 06/02/2023   Sleep-related hypoxia 03/24/2023   At risk for obstructive sleep apnea 03/24/2023   Acute pulmonary embolism (HCC) 05/10/2022   Allergic rhinitis 03/15/2021   Gastro-esophageal reflux disease without esophagitis 03/15/2021   Mild persistent asthma, uncomplicated 03/15/2021   Aortic atherosclerosis (HCC) by Chest CT scan on 02/02/2018  01/10/2021   Asthma    Back pain 03/17/2019   Lumbosacral spondylosis without myelopathy 03/17/2019   Pseudogout 12/09/2017   Major depressive disorder, recurrent, severe without psychotic features (HCC)    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   Suicide attempt by drug ingestion (HCC)    Vitamin D  deficiency 08/22/2014   Hyperlipidemia, mixed 08/22/2014   Hypertension    Anxiety    Obesity, unspecified     PCP: Elsie Richards MD  REFERRING PROVIDER: Barton,  Lillia, MD (for ankle) Laqueta Sharper, MD (for low back)  REFERRING DIAG: S82.852D (ICD-10-CM) - Displaced trimalleolar fracture of left lower leg, subsequent encounter for closed fracture with routine healing   THERAPY DIAG:  Stiffness of left ankle, not elsewhere classified  Muscle weakness (generalized)  Pain in left ankle and joints of left foot  Difficulty in walking, not elsewhere classified  Other low back pain  Rationale for Evaluation and Treatment: Rehabilitation  ONSET DATE: July 2024  SUBJECTIVE:   SUBJECTIVE STATEMENT:  Pt reports no improvement foot/ankle pain. Unable to get in with Dr. Barton until 1.5 weeks, so seeing Dr. Gershon on Tuesday next week regarding ongoing pain. No pain in thoracic region. It's been feeling better. I attribute that to the  PT.   PERTINENT HISTORY: The patient is s/p left ankle trimalleolar & syndesmosis ORIF, DOS: 06/05/2023.  Removal of hardware on 04/26/24.  Removal of deep hardware on 12/03/2023.   Hx of foot/ankle surgery in 2022  Fibromyalgia, HTN, PE s/p covid in 2023 LBP with MRI (10/2022) evidence of chronic mild anterior wedge compression fracture of T12 and chronic anterior wedge compression deformity of L1   PAIN:  Are you having pain? Yes:  NPRS scale: 0/10 thoracic and lumbar pain currently ; 6-7/10 L ankle/foot pain    PRECAUTIONS: None   WEIGHT BEARING RESTRICTIONS: WBAT  FALLS:  Has patient fallen in last 6 months? Yes. Number of falls 1 fell down wet hill walking dog  LIVING ENVIRONMENT: Lives with: lives alone Lives in: House/apartment Stairs: Yes: External: 3 steps; none Has following equipment at home: Single point cane   OCCUPATION: retired   PLOF: Independent  PATIENT GOALS: walk normally and as pain free as able  NEXT MD VISIT: June 2025  OBJECTIVE:  Note: Objective measures were completed at Evaluation unless otherwise noted.  MRI IMPRESSION: 1. No acute findings or clear explanation for the patient's symptoms. Overall findings are similar to previous CT from 2020. 2. Interval development of a healed mild superior endplate compression deformity at L1 since 2020. No acute osseous findings. 3. Multilevel spondylosis as described, most advanced at L4-5 where there is mild spinal stenosis and asymmetric lateral recess and foraminal narrowing on the left with suspected chronic left L4 nerve root encroachment. 4. Mild asymmetric lateral recess and foraminal narrowing on the left at L5-S1 without definite nerve root encroachment.   LOWER EXTREMITY ROM:   Active ROM Right eval Left eval Left  11/17/23 Right 2/28 Left 2/28 AROM/PROM Left 4/21 AROM  L 5/12  L 6/19 L 6/30 L 8/14 L 06/21/24  Hip flexion                  Hip extension                  Hip  abduction                  Hip adduction                  Hip internal rotation                  Hip external rotation                  Knee flexion                  Knee extension  Ankle dorsiflexion   -8 Lacking 2 14 Lacking 7 deg / 3  with pain Lacking 1 degree/pain with AAROM  4 1 (calf tightness) 10deg 6 10*  Ankle plantarflexion   30d 42 65 55 65deg 65 54 55 51 48*  Ankle inversion   Limited by 50%   43 24 12 17 22 24 25  19*  Ankle eversion   Limited by 50%   10 2 with pain 12 5 pain 11 15 11  12*   (Blank rows = not tested)  LEFS 4/21: 14/80 LEFS: Lower Extremity Functional Score: 30 / 80 = 37.5 % ODI Score = 17 points (34%)  LEFS 6/19: 31/80 6/19 Modified Owsestry: 50% 8/14 MODI: 30% 8/`4: LEFS:24/80   MMT 4+ throughout L ankle with pain into EV MMT 6/19: 5/5 DF and PF, 4+/5 In,ev.  Hip flexion 11.1lbs R, 17.7, abduction 11.1R, 19L (seated)  06/21/24  Ankle DF L 4+/5, inversion 4+/5, eversion 4+/5 Shoulder strength: flexion 4+/5 B, ABD 4+/5     Back/neck ROM 06/21/24  Cervical AROM: extension about 25% limited, flexion full but painful in collar bones, lateral flexion at least 50% limited B, rotation about 50% limited B  Thoracic AROM: flexion 90% limited, extension WNL but painful/some lumbar popping, lateral flexion 50% limited B left more painful, rotation 50% limited B Lumbar AROM: 50% limited, extension 90% limited painful, lateral flexion R WNL L 50% limited, rotation R 50% limited L 80% limited  Rounded shoulders, thoracic kyphosis noted     TODAY'S TREATMENT:    07/09/24: Seated heel raises 2x10 Seated wobble board x10 ant/post, 2x10 lateral Seated UT stretch 2x30 seconds B -Levator stretching 2x20sec x2ea -Seated bil ER RTB 2x10 -Seated horizontal abduction RTB  2x10 Seated row GTB 2x15 -seated W 2x10 -pec stretch (hands behind back and rolling shoulder posteriorly) 10sec hold x5 Seated shoulder shrugs x10   07/06/24: Pt  received L ankle DF, PF, Inv, and Eve PROM per pt and tissue tolerance. Ankle Inv/Eve on half roll Wobble board F/B seated  Seated row GTB 2x10 Seated ER with retraction with RTB 2x10 Seated shoulder hz abd with RTB x 10 Seated UT stretch 2x30 sec bilat Seated LS stretch 2x30 sec bilat Posterior shoulder rolls approx 5 reps (Pt had pain) Seated heel raises 2x10  07/02/24: Seated UT stretch 2x30 seconds B -Posterior rols (pain in ACJ) -Levator stretching 2x20sec x2ea -Seated bil ER RTB 2x10 -Seated horizontal abduction RTB  2x10 Seated row GTB x20 -seated W 2x10  06/29/24  UBE L2.5 x4 min forward/4 min backward    Seated UT stretch 2x30 seconds B Levator stretch 1x30 seconds B  SCM stretch 1x30 seconds B  Scap retractions 12x3 seconds  Thoracic flexion + rotation stretch 10x3 seconds B Thoracic extension + BUE ER for pec stretch x10  Modified QL stretch 3x10 seconds B  TA sets seated 15x2 seconds  Backward shoulder rolls x15   06/25/24 -STM (gentle) to post tib and peroneal area -Gentle PROM L ankle -Ankle AROM x10ea -Standing row GTB 2x10 (required seated break in between due to foot/ankle pain) -Seated bil ER RTB x10 -Seated horizontal abduction RT x10 -seated wall angel x10 -HPE update/review -Education and instruction on desensitization techniques and self STM. Education on postural awareness and correction with daily ativities  06/21/24  Thoracic eval, re-measured ankle and lumbar for cert/POC   Education on findings, POC moving forward in context of upcoming breast reduction surgery, HEP for thoracic region and neck  UBE L2.5  x3 min forward, 3 min backward  Thoracic rotation ROM Thoracic flexion + rotation stretch Thoracic/lumbar extension stretch     06/19/24 Manual Therapy:  STM to L plantar fascia   - Cross friction massage to tarsal tunnel area with no tolerance due to hypersensitvity - STM to posterior popliteal region due to possible  compression of sciatic nerve.  - Game ready to address swelling and nerve compression in tarsal tunnel.  - Discussion about possible adverse reactions to medications, tarsal tunnel syndrome.      PATIENT EDUCATION:  Education details:  exercise form, relevant anatomy, POC, and rationale of interventions. Person educated: Patient Education method: Explanation, Demonstration, Tactile cues, Verbal cues Education comprehension: verbalized understanding, returned demonstration, verbal cues required, tactile cues required, and needs further education     HOME EXERCISE PROGRAM: Access Code: [email or web address] URL: [email or web address] Date: 06/22/24 Prepared by: Josette Rough      ASSESSMENT:  CLINICAL IMPRESSION:    Pt continues to experience high pain levels in L foot with activity as well as at rest. Performed gentle ankle and foot ROM/strengthening within pain tolerance. Excellent tolerance for progressions to scapulothoracic strengthening/postural re-ed. Able to increase repetitions with resisted rows today without complaint. Pt reported resisted exercises are becoming easier to do than in her first few sessions. Added pec stretch today with obvious tightness here observed. Pt with increased soreness following stretching likely due to FM. Will continue to progress as tolerated.      OBJECTIVE IMPAIRMENTS: Abnormal gait, decreased activity tolerance, decreased balance, difficulty walking, decreased ROM, decreased strength, obesity, and pain.   ACTIVITY LIMITATIONS: squatting, stairs, and locomotion level  PARTICIPATION LIMITATIONS: shopping and community activity  PERSONAL FACTORS: Past/current experiences and 1 comorbidity: back dysfunction are also affecting patient's functional outcome.   REHAB POTENTIAL: Good  CLINICAL DECISION MAKING: Evolving/moderate complexity  EVALUATION COMPLEXITY: Moderate   GOALS:  SHORT TERM GOALS: Target date: 07/13/2024   Pt will  tolerate full aquatic sessions consistently without increase in pain and with improving function to demonstrate good toleration and effectiveness of intervention.  Baseline: Goal status: Met 11/24/23  2.  Pt will navigate stair into and out of pool 6 steps using step through pattern Baseline: step-to pattern Goal status: MET   3.  Pt will report reduction in pain by 3 NPRS post aquatic session which lasts for up to 24 hours. Baseline:  Goal status: Met 11/24/23  4.  Pt will amb without antalgic gait pattern submerged in 3.6 ft Baseline:  Goal status:MET - 11/17/23  5.  Pt will demo improved quality of gait including increased toe off and stance time on L LE with reduced favoring of L LE. Goal status: ongoing 06/22/24 Target date:    6.  Pt will demo improved L ankle AROM to at least 0 deg in DF and 7 deg in Eversion for improved stiffness and gait.  Goal status: MET 6/19 Target date:  01/23/2024     LONG TERM GOALS: Target date: 08/03/2024      Pt will demonstrate an improvement in LEFS by at least 9 points for a clinically significant improvement in self perceived disability.  Baseline: 30/80 Goal status: MET  2.  Pt wiil navigate 1 flight of steps using step alternating pattern indep Baseline:  Goal status: ongoing 06/22/24  3.  Pt will demonstrate improved DF AROM to be w/n 5 deg of R ankle for improved stiffness, gait, and performance of stairs.  Baseline: see chart Goal status: MET 7/17  4.  Pt will demo left ankle strength to be at least 4/5 in DF and WFL in PF (tested with manual resistance in open chain) in order for improved performance of and tolerance with functional mobility Baseline: see chart Goal status:  MET  5.  Pt will amb without AD without limitation to pain Baseline:using cane or walker  Goal status: MET 7/17  6.  Pt will have improved Oswestry score to >/= 43/50 to demo MCID Baseline: Oswestry Score: 21 / 50 or 42 % Goal status: ongoing (25/50 on  6/19)  7.  Pt will be able to tolerate standing tasks for >/=15 min for household chores/tasks Baseline: 2 min Goal status: MET (max 7/17)  8.  Pt will be able to demo increased hip strength to at least 4/5 for improved standing tolerance Baseline: See MMT above Goal status: ongoing   9. Thoracic and lumbar AROM to be no more than 25% limited all planes of motion Goal status: NEW  10. Will demonstrate improved awareness of functional posture Goal status: NEW  11. Periscap muscle strength (middle trap, lower trap, etc) to be at least 4/5 Goal status: NEW    PLAN:  PT FREQUENCY: 2x/wk  PT DURATION: 6 weeks     PLANNED INTERVENTIONS: 97164- PT Re-evaluation, 97110-Therapeutic exercises, 97530- Therapeutic activity, 97112- Neuromuscular re-education, 97535- Self Care, [id number]- Manual therapy, [phone]- Gait training, [phone]- Orthotic Fit/training, [phone]- Aquatic Therapy, Patient/Family education, Balance training, Stair training, Taping, Dry Needling, Joint mobilization, DME instructions, Cryotherapy, and Moist heat  PLAN FOR NEXT SESSION:  Cont with ankle ROM.  Gait training.  Continue gross hip and core strengthening. Soft tissue work to lumbar paraspinals.  Cont cupping to lumbar paraspinals. Thoraco-cervical interventions, postural training.   Asberry Rodes, PTA  07/09/24 12:17 PM

## 2024-07-12 ENCOUNTER — Ambulatory Visit: Admitting: Internal Medicine

## 2024-07-12 ENCOUNTER — Encounter: Payer: Self-pay | Admitting: Internal Medicine

## 2024-07-12 VITALS — BP 126/79 | HR 76 | Temp 98.6°F | Ht 63.0 in | Wt 242.6 lb

## 2024-07-12 DIAGNOSIS — G4733 Obstructive sleep apnea (adult) (pediatric): Secondary | ICD-10-CM

## 2024-07-12 DIAGNOSIS — J4530 Mild persistent asthma, uncomplicated: Secondary | ICD-10-CM | POA: Diagnosis not present

## 2024-07-12 DIAGNOSIS — J301 Allergic rhinitis due to pollen: Secondary | ICD-10-CM | POA: Diagnosis not present

## 2024-07-12 DIAGNOSIS — K219 Gastro-esophageal reflux disease without esophagitis: Secondary | ICD-10-CM | POA: Diagnosis not present

## 2024-07-12 NOTE — Patient Instructions (Signed)
 It was a pleasure to see you today!  Please schedule follow up in 1 year.  If my schedule is not open yet, we will contact you with a reminder closer to that time. Please call [phone] if you haven't heard from us  a month before, and always call us  sooner if issues or concerns arise. You can also send us  a message through MyChart, but but aware that this is not to be used for urgent issues and it may take up to 5-7 days to receive a reply. Please be aware that you will likely be able to view your results before I have a chance to respond to them. Please give us  5 business days to respond to any non-urgent results.    Continue zyrtec   continue symbicort 2 puffs once daily, gargle after use.   If you are sick or could get sick start taking budesonide  nebs daily and extend for three more days after you are feeling better.   Continue reflux and allergy  medications.   Your sleep apnea appears well controlled. Please call to cancel your sleep study on September 15th.

## 2024-07-12 NOTE — Progress Notes (Signed)
 Haley White    [id number]    09-18-1956  Primary Care Physician:Cranford, Bascom, NP Date of Appointment: 07/12/2024 Established Patient Visit  Chief complaint:   Chief Complaint  Patient presents with   Medical Management of Chronic Issues    Asthma and OSA     HPI: Haley White is a 68 y.o. woman with moderate persistent asthma and covid 19 infection in June 2023. History of provoked pulmonary embolism with covid, treated with six months of eliquis .   Interval Updates: Here for follow up for asthma and OSA.   Had episode of chest pain last month. Went to the ED and was given an antibiotic. She is feeling better. She is having a raspy voice.  Minimal albuterol  use. No oral steroid use.   Wearing CPAP with new medium facemask, autocpap.  Download reviewed: 97% adherence, minimal leak. Pressure 5-15 with median pressure 14.4 cm H20. AHI <5   Current Regimen: 2 puffs in the morning bid symbicort, prn budesonide  neb.  Asthma Triggers: URIs Exacerbations in the last year: none History of hospitalization or intubation: none Allergy  Testing/rhinitis: yes on zyrtec , singulair , flonase (follows at [place] [facility] ) GERD: yes on PPI ACT:  Asthma Control Test ACT Total Score  07/12/2024 10:20 AM 21  11/13/2023 11:06 AM 24  03/13/2023  9:34 AM 20   FeNO: Serum Eos/IgE:   I have reviewed the patient's family social and past medical history and updated as appropriate.   Past Medical History:  Diagnosis Date   Anxiety    Arthritis    Asthma    Cataract    Depression    Gout    Hyperlipidemia    Hypertension    Obese    Sleep apnea     Past Surgical History:  Procedure Laterality Date   ANKLE SURGERY Left    APPENDECTOMY  1984   APPENDECTOMY     CATARACT EXTRACTION W/ INTRAOCULAR LENS  IMPLANT, BILATERAL  2014   CESAREAN SECTION  1984, 1987, 1994   X3   CESAREAN SECTION     x3   COLONOSCOPY  11/16/2013   w/Brodie    EXTERNAL FIXATION LEG Left 06/04/2023    Procedure: EXTERNAL FIXATION LEG;  Surgeon: Barton Drape, MD;  Location: [facility];  Service: Orthopedics;  Laterality: Left;   EYE SURGERY Bilateral    lasix   FOOT FRACTURE SURGERY Left    FOOT SURGERY Left 2002   FRACTURE SURGERY Left    left ankle   HARDWARE REMOVAL Left 12/03/2023   Procedure: HARDWARE REMOVAL OF SYNDEMOSIS;  Surgeon: Barton Drape, MD;  Location:  [facility];  Service: Orthopedics;  Laterality: Left;   KNEE SURGERY Left 1998   ORIF ANKLE FRACTURE Left 06/04/2023   Procedure: OPEN REDUCTION INTERNAL FIXATION (ORIF) ANKLE FRACTURE vs EX FIX;  Surgeon: Barton Drape, MD;  Location: [facility];  Service: Orthopedics;  Laterality: Left;   ROTATOR CUFF REPAIR Right 2007   shoulder     torn rotator cuff   TUBAL LIGATION  1994   TUBAL LIGATION      Family History  Problem Relation Age of Onset   Dementia Father    Anxiety disorder Sister    Colon cancer Neg Hx    Colon polyps Neg Hx    Esophageal cancer Neg Hx    Stomach cancer Neg Hx    Rectal cancer Neg Hx     Social History   Occupational History  Not on file  Tobacco Use   Smoking status: Never   Smokeless tobacco: Never  Vaping Use   Vaping status: Never Used  Substance and Sexual Activity   Alcohol use: Yes    Comment: occasionally   Drug use: Never    Comment: 06/03/2022-gummies   Sexual activity: Yes    Birth control/protection: Post-menopausal, Surgical     Physical Exam: Blood pressure 126/79, pulse 76, temperature 98.6 F (37 C), temperature source Temporal, height 5' 3 (1.6 m), weight 242 lb 9.6 oz (110 kg), last menstrual period 11/26/2011, SpO2 96%.  Gen:      No distress, well appearing ENT:  mmm Lungs:    breathing non labored, lungs ctab no wheeze CV:         RRR no mrg  Data Reviewed: Imaging: I have personally reviewed the CT Chest August 2025 shows no PE, no chronic parenchymal disease.   PFTs:     Latest Ref Rng & Units 06/14/2022   11:52 AM   PFT Results  FVC-Pre L 3.00   FVC-Predicted Pre % 102   FVC-Post L 2.98   FVC-Predicted Post % 101   Pre FEV1/FVC % % 84   Post FEV1/FCV % % 87   FEV1-Pre L 2.53   FEV1-Predicted Pre % 113   FEV1-Post L 2.58   DLCO uncorrected ml/min/mmHg 17.01   DLCO UNC% % 91   DLCO corrected ml/min/mmHg 18.78   DLCO COR %Predicted % 101   DLVA Predicted % 102   TLC L 5.07   TLC % Predicted % 106   RV % Predicted % 101    I have personally reviewed the patient's PFTs and they show normal pulmonary function.   Labs: Lab Results  Component Value Date   WBC 7.8 06/29/2024   HGB 11.9 (L) 06/29/2024   HCT 35.0 (L) 06/29/2024   MCV 94.1 06/29/2024   PLT 254 06/29/2024   Lab Results  Component Value Date   NA 142 06/29/2024   K 3.6 06/29/2024   CL 107 06/29/2024   CO2 24 06/29/2024     Immunization status: Immunization History  Administered Date(s) Administered   INFLUENZA, HIGH DOSE SEASONAL PF 10/17/2017   Influenza, Quadrivalent, Recombinant, Inj, Pf 08/20/2018   Influenza,inj,Quad PF,6+ Mos 09/17/2017   Influenza-Unspecified 08/20/2018   PFIZER(Purple Top)SARS-COV-2 Vaccination 01/20/2020, 02/10/2020, 03/27/2020   PPD Test 10/08/2017, 11/29/2019, 01/10/2021   Pneumococcal Conjugate-13 11/05/1995   Pneumococcal Polysaccharide-23 10/08/2017, 06/15/2021   Td 11/29/2019   Tdap 11/04/2008, 03/28/2020    External Records Personally Reviewed: ED visits  Assessment:  Mild intermittent asthma, controlled Gerd, controlled Seasonal allergies, controlled OSA on CPAP controlled  Plan/Recommendations:  Continue zyrtec   continue symbicort 2 puffs once daily, gargle after use.   If you are sick or could get sick start taking budesonide  nebs daily and extend for three more days after you are feeling better.   Continue reflux and allergy  medications.   Your sleep apnea appears well controlled. Please call to cancel your sleep study on September 15th.   Return to Care: Return  in about 1 year (around 07/12/2025).   Verdon Gore, MD Pulmonary and Critical Care Medicine [facility] [phone]

## 2024-07-13 ENCOUNTER — Ambulatory Visit: Admitting: Podiatry

## 2024-07-13 ENCOUNTER — Encounter: Payer: Self-pay | Admitting: Podiatry

## 2024-07-13 ENCOUNTER — Ambulatory Visit (INDEPENDENT_AMBULATORY_CARE_PROVIDER_SITE_OTHER)

## 2024-07-13 ENCOUNTER — Encounter (HOSPITAL_BASED_OUTPATIENT_CLINIC_OR_DEPARTMENT_OTHER): Payer: Self-pay | Admitting: Physical Therapy

## 2024-07-13 ENCOUNTER — Ambulatory Visit (HOSPITAL_BASED_OUTPATIENT_CLINIC_OR_DEPARTMENT_OTHER): Admitting: Physical Therapy

## 2024-07-13 DIAGNOSIS — M5459 Other low back pain: Secondary | ICD-10-CM

## 2024-07-13 DIAGNOSIS — M67472 Ganglion, left ankle and foot: Secondary | ICD-10-CM

## 2024-07-13 DIAGNOSIS — M546 Pain in thoracic spine: Secondary | ICD-10-CM

## 2024-07-13 DIAGNOSIS — R262 Difficulty in walking, not elsewhere classified: Secondary | ICD-10-CM

## 2024-07-13 DIAGNOSIS — M25572 Pain in left ankle and joints of left foot: Secondary | ICD-10-CM

## 2024-07-13 DIAGNOSIS — M6281 Muscle weakness (generalized): Secondary | ICD-10-CM

## 2024-07-13 DIAGNOSIS — M25672 Stiffness of left ankle, not elsewhere classified: Secondary | ICD-10-CM | POA: Diagnosis not present

## 2024-07-13 NOTE — Progress Notes (Signed)
 Subjective:   Patient ID: Haley White, female   DOB: 68 y.o.   MRN: [id number]   HPI Chief Complaint  Patient presents with   Foot Pain    Rm11 Cyst left foot dorsal painful for 3 months   68 year old female presents the office with concerns of a cyst at the dorsal aspect left foot.  She states the areas been quite tender to put pressure on the foot.  She recently did sustain an ankle fracture she has been treated by orthopedics for this.  She had the same issue and June 2024 in which the fluid was expressed.  She states it has been more sore over the last 3 months.   Review of Systems  All other systems reviewed and are negative.  Past Medical History:  Diagnosis Date   Anxiety    Arthritis    Asthma    Cataract    Depression    Gout    Hyperlipidemia    Hypertension    Obese    Sleep apnea     Past Surgical History:  Procedure Laterality Date   ANKLE SURGERY Left    APPENDECTOMY  1984   APPENDECTOMY     CATARACT EXTRACTION W/ INTRAOCULAR LENS  IMPLANT, BILATERAL  2014   CESAREAN SECTION  1984, 1987, 1994   X3   CESAREAN SECTION     x3   COLONOSCOPY  11/16/2013   w/Brodie    EXTERNAL FIXATION LEG Left 06/04/2023   Procedure: EXTERNAL FIXATION LEG;  Surgeon: Barton Drape, MD;  Location: [facility];  Service: Orthopedics;  Laterality: Left;   EYE SURGERY Bilateral    lasix   FOOT FRACTURE SURGERY Left    FOOT SURGERY Left 2002   FRACTURE SURGERY Left    left ankle   HARDWARE REMOVAL Left 12/03/2023   Procedure: HARDWARE REMOVAL OF SYNDEMOSIS;  Surgeon: Barton Drape, MD;  Location: [facility];  Service: Orthopedics;  Laterality: Left;   KNEE SURGERY Left 1998   ORIF ANKLE FRACTURE Left 06/04/2023   Procedure: OPEN REDUCTION INTERNAL FIXATION (ORIF) ANKLE FRACTURE vs EX FIX;  Surgeon: Barton Drape, MD;  Location: [facility];  Service: Orthopedics;  Laterality: Left;   ROTATOR CUFF REPAIR Right 2007   shoulder     torn rotator cuff   TUBAL  LIGATION  1994   TUBAL LIGATION       Current Outpatient Medications:    albuterol  (VENTOLIN  HFA) 108 (90 Base) MCG/ACT inhaler, Inhale 2 puffs into the lungs every 6 (six) hours as needed for wheezing or shortness of breath., Disp: , Rfl:    aspirin  EC 81 MG tablet, Take 1 tablet (81 mg total) by mouth daily. Swallow whole., Disp: , Rfl:    azelastine  (ASTELIN ) 0.1 % nasal spray, Place 1 spray into both nostrils 2 (two) times daily. Use in each nostril as directed, Disp: 30 mL, Rfl: 12   budesonide  (PULMICORT ) 0.5 MG/2ML nebulizer solution, Take 2 mLs (0.5 mg total) by nebulization daily as needed., Disp: 2 mL, Rfl: 2   buPROPion  (WELLBUTRIN  XL) 300 MG 24 hr tablet, Take 300 mg by mouth daily., Disp: , Rfl:    cetirizine  (ZYRTEC  ALLERGY ) 10 MG tablet, Take 1 tablet (10 mg total) by mouth daily., Disp: 90 tablet, Rfl: 3   EPINEPHrine  0.3 mg/0.3 mL IJ SOAJ injection, See admin instructions., Disp: , Rfl:    FLUoxetine  (PROZAC ) 20 MG capsule, Take 60 mg by mouth daily., Disp: , Rfl:  fluticasone  (FLONASE) 50 MCG/ACT nasal spray, Place 1 spray into both nostrils daily as needed for allergies or rhinitis., Disp: , Rfl:    lisinopril  (ZESTRIL ) 20 MG tablet, Take 2 tablets (40 mg total) by mouth daily. (Patient taking differently: Take 20 mg by mouth daily.), Disp: 90 tablet, Rfl: 0   methylphenidate  (RITALIN ) 10 MG tablet, Take 10 mg by mouth 2 (two) times daily., Disp: , Rfl: 0   montelukast  (SINGULAIR ) 10 MG tablet, Take 10 mg by mouth at bedtime., Disp: , Rfl:    nitroGLYCERIN  (NITROSTAT ) 0.4 MG SL tablet, Place 1 tablet (0.4 mg total) under the tongue every 5 (five) minutes as needed for chest pain., Disp: 25 tablet, Rfl: 3   NON FORMULARY, Inject 1 Dose into the skin once a week. Allergy  shots from [facility]  at [place]. Patient not sure about the name of medicine, Disp: , Rfl:    pantoprazole  (PROTONIX ) 40 MG tablet, TAKE 1 TABLET BY MOUTH DAILY TO PREVENT INDIGESTION AND HEARTBURN,  Disp: 90 tablet, Rfl: 3   rosuvastatin  (CRESTOR ) 40 MG tablet, Take  1 tablet Daily for Cholesterol            \    TAKE  BY MOUTH, Disp: 90 tablet, Rfl: 3   SYMBICORT 160-4.5 MCG/ACT inhaler, Inhale 2 puffs into the lungs 2 (two) times daily., Disp: , Rfl: 6   traZODone  (DESYREL ) 50 MG tablet, Take 1 tablet (50 mg total) by mouth at bedtime., Disp: 60 tablet, Rfl: 5   VITAMIN D  PO, Take 10,000 Units by mouth daily., Disp: , Rfl:   Current Facility-Administered Medications:    ipratropium-albuterol  (DUONEB) 0.5-2.5 (3) MG/3ML nebulizer solution 3 mL, 3 mL, Nebulization, Once,   Allergies  Allergen Reactions   Molds & Smuts Anaphylaxis   Biaxin [Clarithromycin]     GI upset   Biaxin [Clarithromycin] Other (See Comments)    GI Upset   Prednisone      Was admitted for self harm when on prednisone  before   Prednisone  Other (See Comments)    Suicidal   Serevent [Salmeterol]     Palpitations   Tequin [Gatifloxacin]     GI upset. thrush   Tequin [Gatifloxacin] Other (See Comments)    GI upset, Thrush   Serevent [Salmeterol] Palpitations          Objective:  Physical Exam  General: AAO x3, NAD  Dermatological: Incisions from prior surgery are all well-healed.  There is no open lesions identified at this time.  Vascular: Dorsalis Pedis artery and Posterior Tibial artery pedal pulses are 2/4 bilateral with immedate capillary fill time.  There is no pain with calf compression, swelling, warmth, erythema.   Neruologic: Grossly intact via light touch bilateral.  Musculoskeletal: There is tenderness palpation on the dorsal aspect the midfoot only area of the soft tissue mass.  The soft tissue masses mobile and appears to be fluid-filled consistent with likely ganglion cyst.  Tenderness palpation directly on the area.  No other areas of discomfort.  Gait: Unassisted, Nonantalgic.       Assessment:   Soft tissue mass left foot     Plan:   -Treatment options discussed including  all alternatives, risks, and complications -Etiology of symptoms were discussed -X-rays were obtained and reviewed with the patient.  Hardware intact from prior ankle surgery.  Arthritic changes present in the foot.  No evidence of acute fracture. -We discussed the conservative as well as surgical options.  Discussed repeat aspiration but however she  has been dealing with this for quite some time.  Discussed with her surgical excision and she wishes to proceed with this. -Given arthritis could continue to have symptoms postoperatively. -The incision placement as well as the postoperative course was discussed with the patient. I discussed risks of the surgery which include, but not limited to, infection, bleeding, pain, swelling, need for further surgery, delayed or nonhealing, painful or ugly scar, numbness or sensation changes, over/under correction, recurrence, transfer lesions, further deformity, hardware failure, DVT/PE, loss of toe/foot. Patient understands these risks and wishes to proceed with surgery. The surgical consent was reviewed with the patient all 3 pages were signed. No promises or guarantees were given to the outcome of the procedure. All questions were answered to the best of my ability. Before the surgery the patient was encouraged to call the office if there is any further questions. The surgery will be performed at the [facility] on an outpatient basis.  No follow-ups on file.  Donnice JONELLE Fees DPM

## 2024-07-13 NOTE — Therapy (Unsigned)
 OUTPATIENT PHYSICAL THERAPY THORACIC TREATMENT    Patient Name: Haley White MRN: [id number] DOB:[birth year], 68 y.o., female Today's Date: 07/13/2024  END OF SESSION:              Past Medical History:  Diagnosis Date   Anxiety    Arthritis    Asthma    Cataract    Depression    Gout    Hyperlipidemia    Hypertension    Obese    Sleep apnea    Past Surgical History:  Procedure Laterality Date   ANKLE SURGERY Left    APPENDECTOMY  1984   APPENDECTOMY     CATARACT EXTRACTION W/ INTRAOCULAR LENS  IMPLANT, BILATERAL  2014   CESAREAN SECTION  1984, 1987, 1994   X3   CESAREAN SECTION     x3   COLONOSCOPY  11/16/2013   w/Brodie    EXTERNAL FIXATION LEG Left 06/04/2023   Procedure: EXTERNAL FIXATION LEG;  Surgeon: Barton Drape, MD;  Location: [facility];  Service: Orthopedics;  Laterality: Left;   EYE SURGERY Bilateral    lasix   FOOT FRACTURE SURGERY Left    FOOT SURGERY Left 2002   FRACTURE SURGERY Left    left ankle   HARDWARE REMOVAL Left 12/03/2023   Procedure: HARDWARE REMOVAL OF SYNDEMOSIS;  Surgeon: Barton Drape, MD;  Location: [facility];  Service: Orthopedics;  Laterality: Left;   KNEE SURGERY Left 1998   ORIF ANKLE FRACTURE Left 06/04/2023   Procedure: OPEN REDUCTION INTERNAL FIXATION (ORIF) ANKLE FRACTURE vs EX FIX;  Surgeon: Barton Drape, MD;  Location: [facility];  Service: Orthopedics;  Laterality: Left;   ROTATOR CUFF REPAIR Right 2007   shoulder     torn rotator cuff   TUBAL LIGATION  1994   TUBAL LIGATION     Patient Active Problem List   Diagnosis Date Noted   Neck pain 05/31/2024   Symptomatic mammary hypertrophy 05/31/2024   Prediabetes 04/22/2024   History of thromboembolism 04/22/2024   OSA on CPAP 08/11/2023   Ankle fracture 06/03/2023   Trimalleolar fracture of left ankle 06/02/2023   Acute left ankle pain 06/02/2023   Sleep-related hypoxia 03/24/2023   At risk for obstructive sleep apnea 03/24/2023    Acute pulmonary embolism (HCC) 05/10/2022   Allergic rhinitis 03/15/2021   Gastro-esophageal reflux disease without esophagitis 03/15/2021   Mild persistent asthma, uncomplicated 03/15/2021   Aortic atherosclerosis (HCC) by Chest CT scan on 02/02/2018  01/10/2021   Asthma    Back pain 03/17/2019   Lumbosacral spondylosis without myelopathy 03/17/2019   Pseudogout 12/09/2017   Major depressive disorder, recurrent, severe without psychotic features (HCC)    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  Rationale for Evaluation and Treatment: Rehabilitation  ONSET DATE: July 2024  SUBJECTIVE:   SUBJECTIVE STATEMENT:  Pt denies any adverse effects after prior Rx.  My back is getting better.  I think therapy has definitely helped.  Pt saw the podiatrist today and states she is having surgery on 10/1 to  remove the cyst.      PERTINENT HISTORY: The patient is s/p left ankle trimalleolar & syndesmosis ORIF, DOS: 06/05/2023.  Removal of hardware on 04/26/24.  Removal of deep hardware on 12/03/2023.   Hx of foot/ankle surgery in 2022  Fibromyalgia, HTN, PE s/p covid in 2023 LBP with MRI (10/2022) evidence of chronic mild anterior wedge compression fracture of T12 and chronic anterior wedge compression deformity of L1   PAIN:  Are you having pain? Yes:  NPRS scale: 3-4/10 thoracic and lumbar pain currently ; 6/10 L ankle/foot pain    PRECAUTIONS: None   WEIGHT BEARING RESTRICTIONS: WBAT  FALLS:  Has patient fallen  in last 6 months? Yes. Number of falls 1 fell down wet hill walking dog  LIVING ENVIRONMENT: Lives with: lives alone Lives in: House/apartment Stairs: Yes: External: 3 steps; none Has following equipment at home: Single point cane   OCCUPATION: retired   PLOF: Independent  PATIENT GOALS: walk normally and as pain free as able  NEXT MD VISIT: June 2025  OBJECTIVE:  Note: Objective measures were completed at Evaluation unless otherwise noted.  MRI IMPRESSION: 1. No acute findings or clear explanation for the patient's symptoms. Overall findings are similar to previous CT from 2020. 2. Interval development of a healed mild superior endplate compression deformity at L1 since 2020. No acute osseous findings. 3. Multilevel spondylosis as described, most advanced at L4-5 where there is mild spinal stenosis and asymmetric lateral recess and foraminal narrowing on the left with suspected chronic left L4 nerve root encroachment. 4. Mild asymmetric lateral recess and foraminal narrowing on the left at L5-S1 without definite nerve root encroachment.   LOWER EXTREMITY ROM:   Active ROM Right eval Left eval Left  11/17/23 Right 2/28 Left 2/28 AROM/PROM Left 4/21 AROM  L 5/12  L 6/19 L 6/30 L 8/14 L 06/21/24  Hip flexion                  Hip extension                  Hip abduction                  Hip adduction                  Hip internal rotation                  Hip external rotation                  Knee flexion                  Knee extension                  Ankle dorsiflexion   -8 Lacking 2 14 Lacking 7 deg / 3  with pain Lacking 1 degree/pain with AAROM  4 1 (calf tightness) 10deg 6 10*  Ankle plantarflexion   30d 42 65 55 65deg 65 54 55 51 48*  Ankle inversion   Limited by 50%   43 24 12 17 22 24 25  19*  Ankle eversion   Limited by 50%   10 2 with pain 12 5 pain 11 15 11  12*   (Blank rows = not tested)  LEFS 4/21: 14/80 LEFS: Lower Extremity Functional Score:  30 / 80 = 37.5 % ODI Score = 17 points (34%)  LEFS 6/19: 31/80 6/19 Modified Owsestry: 50% 8/14 MODI: 30% 8/`4: LEFS:24/80  MMT 4+ throughout L ankle with pain into EV MMT 6/19: 5/5 DF and PF, 4+/5 In,ev.  Hip flexion 11.1lbs R, 17.7, abduction 11.1R, 19L (seated)  06/21/24  Ankle DF L 4+/5, inversion 4+/5, eversion 4+/5 Shoulder strength: flexion 4+/5 B, ABD 4+/5     Back/neck ROM 06/21/24  Cervical AROM: extension about 25% limited, flexion full but painful in collar bones, lateral flexion at least 50% limited B, rotation about 50% limited B  Thoracic AROM: flexion 90% limited, extension WNL but painful/some lumbar popping, lateral flexion 50% limited B left more painful, rotation 50% limited B Lumbar AROM: 50% limited, extension 90% limited painful, lateral flexion R WNL L 50% limited, rotation R 50% limited L 80% limited  Rounded shoulders, thoracic kyphosis noted     TODAY'S TREATMENT:   07/13/24: Seated BAPS board Lvl 2 DF/PF 2x10, Eve/Inv x 10.  Attempted circles though stopped due to pain Pec stretch with hands behind head 3x10-15 sec (2 seated, 1 standing) Seated row GTB 2x15 Seated bil ER RTB 2x10 Seated horizontal abduction RTB  2x10 Seated heel raises 2x10 Gentle PROM to L ankle in DF, PF, Inv, and Eve w/n pt tolerance  Manual Therapy:  STM to bilat thoracic and lumbar paraspinals in prone   07/09/24: Seated heel raises 2x10 Seated wobble board x10 ant/post, 2x10 lateral Seated UT stretch 2x30 seconds B -Levator stretching 2x20sec x2ea -Seated bil ER RTB 2x10 -Seated horizontal abduction RTB  2x10 Seated row GTB 2x15 -seated W 2x10 -pec stretch (hands behind back and rolling shoulder posteriorly) 10sec hold x5 Seated shoulder shrugs x10   07/06/24: Pt received L ankle DF, PF, Inv, and Eve PROM per pt and tissue tolerance. Ankle Inv/Eve on half roll Wobble board F/B seated  Seated row GTB 2x10 Seated ER with retraction with RTB 2x10 Seated shoulder  hz abd with RTB x 10 Seated UT stretch 2x30 sec bilat Seated LS stretch 2x30 sec bilat Posterior shoulder rolls approx 5 reps (Pt had pain) Seated heel raises 2x10  07/02/24: Seated UT stretch 2x30 seconds B -Posterior rols (pain in ACJ) -Levator stretching 2x20sec x2ea -Seated bil ER RTB 2x10 -Seated horizontal abduction RTB  2x10 Seated row GTB x20 -seated W 2x10  06/29/24  UBE L2.5 x4 min forward/4 min backward    Seated UT stretch 2x30 seconds B Levator stretch 1x30 seconds B  SCM stretch 1x30 seconds B  Scap retractions 12x3 seconds  Thoracic flexion + rotation stretch 10x3 seconds B Thoracic extension + BUE ER for pec stretch x10  Modified QL stretch 3x10 seconds B  TA sets seated 15x2 seconds  Backward shoulder rolls x15   06/25/24 -STM (gentle) to post tib and peroneal area -Gentle PROM L ankle -Ankle AROM x10ea -Standing row GTB 2x10 (required seated break in between due to foot/ankle pain) -Seated bil ER RTB x10 -Seated horizontal abduction RT x10 -seated wall angel x10 -HPE update/review -Education and instruction on desensitization techniques and self STM. Education on postural awareness and correction with daily ativities  06/21/24  Thoracic eval, re-measured ankle and lumbar for cert/POC   Education on findings, POC moving forward in context of upcoming breast reduction surgery, HEP for thoracic region and neck  UBE L2.5 x3 min forward, 3 min backward  Thoracic rotation ROM Thoracic flexion + rotation stretch Thoracic/lumbar extension stretch     06/19/24 Manual Therapy:  STM to L plantar fascia   - Cross friction massage to tarsal tunnel area with no tolerance due to hypersensitvity -  STM to posterior popliteal region due to possible compression of sciatic nerve.  - Game ready to address swelling and nerve compression in tarsal tunnel.  - Discussion about possible adverse reactions to medications, tarsal tunnel syndrome.      PATIENT  EDUCATION:  Education details:  exercise form, relevant anatomy, POC, and rationale of interventions. Person educated: Patient Education method: Explanation, Demonstration, Tactile cues, Verbal cues Education comprehension: verbalized understanding, returned demonstration, verbal cues required, tactile cues required, and needs further education     HOME EXERCISE PROGRAM: Access Code: [email or web address] URL: [email or web address] Date: 06/22/24 Prepared by: Josette Rough      ASSESSMENT:  CLINICAL IMPRESSION:    Pt continues to experience high pain levels in L foot with activity as well as at rest. Performed gentle ankle and foot ROM/strengthening within pain tolerance. Excellent tolerance for progressions to scapulothoracic strengthening/postural re-ed. Able to increase repetitions with resisted rows today without complaint. Pt reported resisted exercises are becoming easier to do than in her first few sessions. Added pec stretch today with obvious tightness here observed. Pt with increased soreness following stretching likely due to FM. Will continue to progress as tolerated.   Pt is limited with ankle PROM due to pain.  PT performed gentle PROM w/n pt tolerance.  Tender with STM, feels much better after STM   OBJECTIVE IMPAIRMENTS: Abnormal gait, decreased activity tolerance, decreased balance, difficulty walking, decreased ROM, decreased strength, obesity, and pain.   ACTIVITY LIMITATIONS: squatting, stairs, and locomotion level  PARTICIPATION LIMITATIONS: shopping and community activity  PERSONAL FACTORS: Past/current experiences and 1 comorbidity: back dysfunction are also affecting patient's functional outcome.   REHAB POTENTIAL: Good  CLINICAL DECISION MAKING: Evolving/moderate complexity  EVALUATION COMPLEXITY: Moderate   GOALS:  SHORT TERM GOALS: Target date: 07/13/2024   Pt will tolerate full aquatic sessions consistently without increase in pain and with  improving function to demonstrate good toleration and effectiveness of intervention.  Baseline: Goal status: Met 11/24/23  2.  Pt will navigate stair into and out of pool 6 steps using step through pattern Baseline: step-to pattern Goal status: MET   3.  Pt will report reduction in pain by 3 NPRS post aquatic session which lasts for up to 24 hours. Baseline:  Goal status: Met 11/24/23  4.  Pt will amb without antalgic gait pattern submerged in 3.6 ft Baseline:  Goal status:MET - 11/17/23  5.  Pt will demo improved quality of gait including increased toe off and stance time on L LE with reduced favoring of L LE. Goal status: ongoing 06/22/24 Target date:    6.  Pt will demo improved L ankle AROM to at least 0 deg in DF and 7 deg in Eversion for improved stiffness and gait.  Goal status: MET 6/19 Target date:  01/23/2024     LONG TERM GOALS: Target date: 08/03/2024      Pt will demonstrate an improvement in LEFS by at least 9 points for a clinically significant improvement in self perceived disability.  Baseline: 30/80 Goal status: MET  2.  Pt wiil navigate 1 flight of steps using step alternating pattern indep Baseline:  Goal status: ongoing 06/22/24  3.  Pt will demonstrate improved DF AROM to be w/n 5 deg of R ankle for improved stiffness, gait, and performance of stairs.  Baseline: see chart Goal status: MET 7/17  4.  Pt will demo left ankle strength to be at least 4/5 in DF and WFL in PF (tested with manual resistance in  open chain) in order for improved performance of and tolerance with functional mobility Baseline: see chart Goal status:  MET  5.  Pt will amb without AD without limitation to pain Baseline:using cane or walker  Goal status: MET 7/17  6.  Pt will have improved Oswestry score to >/= 43/50 to demo MCID Baseline: Oswestry Score: 21 / 50 or 42 % Goal status: ongoing (25/50 on 6/19)  7.  Pt will be able to tolerate standing tasks for >/=15 min for  household chores/tasks Baseline: 2 min Goal status: MET (max 7/17)  8.  Pt will be able to demo increased hip strength to at least 4/5 for improved standing tolerance Baseline: See MMT above Goal status: ongoing   9. Thoracic and lumbar AROM to be no more than 25% limited all planes of motion Goal status: NEW  10. Will demonstrate improved awareness of functional posture Goal status: NEW  11. Periscap muscle strength (middle trap, lower trap, etc) to be at least 4/5 Goal status: NEW    PLAN:  PT FREQUENCY: 2x/wk  PT DURATION: 6 weeks     PLANNED INTERVENTIONS: [id number]- PT Re-evaluation, 97110-Therapeutic exercises, 97530- Therapeutic activity, 97112- Neuromuscular re-education, 97535- Self Care, [id number]- Manual therapy, [phone]- Gait training, [phone]- Orthotic Fit/training, [phone]- Aquatic Therapy, Patient/Family education, Balance training, Stair training, Taping, Dry Needling, Joint mobilization, DME instructions, Cryotherapy, and Moist heat  PLAN FOR NEXT SESSION:  Cont with ankle ROM.  Gait training.  Continue gross hip and core strengthening. Soft tissue work to lumbar paraspinals.  Cont cupping to lumbar paraspinals. Thoraco-cervical interventions, postural training.   Asberry Rodes, PTA  07/13/24 12:04 PM

## 2024-07-13 NOTE — Patient Instructions (Signed)

## 2024-07-16 ENCOUNTER — Encounter (HOSPITAL_BASED_OUTPATIENT_CLINIC_OR_DEPARTMENT_OTHER): Payer: Self-pay | Admitting: Physical Therapy

## 2024-07-16 ENCOUNTER — Ambulatory Visit (HOSPITAL_BASED_OUTPATIENT_CLINIC_OR_DEPARTMENT_OTHER): Admitting: Physical Therapy

## 2024-07-16 ENCOUNTER — Telehealth: Payer: Self-pay | Admitting: Podiatry

## 2024-07-16 DIAGNOSIS — M25672 Stiffness of left ankle, not elsewhere classified: Secondary | ICD-10-CM

## 2024-07-16 DIAGNOSIS — M6281 Muscle weakness (generalized): Secondary | ICD-10-CM

## 2024-07-16 DIAGNOSIS — M546 Pain in thoracic spine: Secondary | ICD-10-CM

## 2024-07-16 DIAGNOSIS — M5459 Other low back pain: Secondary | ICD-10-CM

## 2024-07-16 DIAGNOSIS — M25572 Pain in left ankle and joints of left foot: Secondary | ICD-10-CM

## 2024-07-16 DIAGNOSIS — R262 Difficulty in walking, not elsewhere classified: Secondary | ICD-10-CM

## 2024-07-16 NOTE — Telephone Encounter (Signed)
 DOS- 08/04/2024  EXC GANGLION/TUMOR LT- 71909  [employer] EFFECTIVE DATE- 02/02/2022  DEDUCTIBLE- $0 REMAINING- $0 OOP- $4150 REMAINING- $136.66 COINSURANCE- 0%  PER [employer] WEBSITE, NO PRIOR AUTH IS REQUIRED FOR CPT CODE 71909. DOCUMENTATION ATTACHED TO SURGERY CONSENT PACKET.

## 2024-07-16 NOTE — Therapy (Signed)
 OUTPATIENT PHYSICAL THERAPY THORACIC TREATMENT    Patient Name: Haley White MRN: [id number] DOB:Aug 02, 1956, 68 y.o., female Today's Date: 07/16/2024  END OF SESSION:    PT End of Session - 07/16/24 1123     Visit Number 49    Number of Visits 54    Date for PT Re-Evaluation 08/03/24    Authorization Type [employer]etna mcr    PT Start Time 1116    PT Stop Time 1205    PT Time Calculation (min) 49 min    Activity Tolerance Patient tolerated treatment well    Behavior During Therapy WFL for tasks assessed/performed                   Past Medical History:  Diagnosis Date   Anxiety    Arthritis    Asthma    Cataract    Depression    Gout    Hyperlipidemia    Hypertension    Obese    Sleep apnea    Past Surgical History:  Procedure Laterality Date   ANKLE SURGERY Left    APPENDECTOMY  1984   APPENDECTOMY     CATARACT EXTRACTION W/ INTRAOCULAR LENS  IMPLANT, BILATERAL  2014   CESAREAN SECTION  1984, 1987, 1994   X3   CESAREAN SECTION     x3   COLONOSCOPY  11/16/2013   w/Brodie    EXTERNAL FIXATION LEG Left 06/04/2023   Procedure: EXTERNAL FIXATION LEG;  Surgeon: Barton Drape, MD;  Location: [facility];  Service: Orthopedics;  Laterality: Left;   EYE SURGERY Bilateral    lasix   FOOT FRACTURE SURGERY Left    FOOT SURGERY Left 2002   FRACTURE SURGERY Left    left ankle   HARDWARE REMOVAL Left 12/03/2023   Procedure: HARDWARE REMOVAL OF SYNDEMOSIS;  Surgeon: Barton Drape, MD;  Location: [facility];  Service: Orthopedics;  Laterality: Left;   KNEE SURGERY Left 1998   ORIF ANKLE FRACTURE Left 06/04/2023   Procedure: OPEN REDUCTION INTERNAL FIXATION (ORIF) ANKLE FRACTURE vs EX FIX;  Surgeon: Barton Drape, MD;  Location: [facility];  Service: Orthopedics;  Laterality: Left;   ROTATOR CUFF REPAIR Right 2007   shoulder     torn rotator cuff   TUBAL LIGATION  1994   TUBAL LIGATION     Patient Active Problem List   Diagnosis Date Noted    Neck pain 05/31/2024   Symptomatic mammary hypertrophy 05/31/2024   Prediabetes 04/22/2024   History of thromboembolism 04/22/2024   OSA on CPAP 08/11/2023   Ankle fracture 06/03/2023   Trimalleolar fracture of left ankle 06/02/2023   Acute left ankle pain 06/02/2023   Sleep-related hypoxia 03/24/2023   At risk for obstructive sleep apnea 03/24/2023   Acute pulmonary embolism (HCC) 05/10/2022   Allergic rhinitis 03/15/2021   Gastro-esophageal reflux disease without esophagitis 03/15/2021   Mild persistent asthma, uncomplicated 03/15/2021   Aortic atherosclerosis (HCC) by Chest CT scan on 02/02/2018  01/10/2021   Asthma    Back pain 03/17/2019   Lumbosacral spondylosis without myelopathy 03/17/2019   Pseudogout 12/09/2017   Major depressive disorder, recurrent, severe without psychotic features (HCC)    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   Suicide attempt by drug ingestion (HCC)    Vitamin D  deficiency 08/22/2014   Hyperlipidemia, mixed 08/22/2014   Hypertension    Anxiety    Obesity, unspecified     PCP: Elsie Richards MD  REFERRING PROVIDER: Barton Drape, MD (for ankle) Laqueta Sharper, MD (for  low back)  REFERRING DIAG: S82.852D (ICD-10-CM) - Displaced trimalleolar fracture of left lower leg, subsequent encounter for closed fracture with routine healing   THERAPY DIAG:  Stiffness of left ankle, not elsewhere classified  Muscle weakness (generalized)  Pain in left ankle and joints of left foot  Other low back pain  Pain in thoracic spine  Difficulty in walking, not elsewhere classified  Rationale for Evaluation and Treatment: Rehabilitation  ONSET DATE: July 2024  SUBJECTIVE:   SUBJECTIVE STATEMENT:  Pt states her back pain is definitely better than what it was.  It comes and goes.  Pt states she felt good after prior Rx especially her back.  Pt states her ankle is hurting more today and is swollen.  Pt doesn't know anything she did differently  except she was on her feet more yesterday.  Pt iced hear ankle twice yesterday.       PERTINENT HISTORY: The patient is s/p left ankle trimalleolar & syndesmosis ORIF, DOS: 06/05/2023.  Removal of hardware on 04/26/24.  Removal of deep hardware on 12/03/2023.   Hx of foot/ankle surgery in 2022  Fibromyalgia, HTN, PE s/p covid in 2023 LBP with MRI (10/2022) evidence of chronic mild anterior wedge compression fracture of T12 and chronic anterior wedge compression deformity of L1   PAIN:  Are you having pain? Yes:  NPRS scale: 4/10 central and R sided lumbar pain currently ; 7/10 L ankle/foot pain    PRECAUTIONS: None   WEIGHT BEARING RESTRICTIONS: WBAT  FALLS:  Has patient fallen in last 6 months? Yes. Number of falls 1 fell down wet hill walking dog  LIVING ENVIRONMENT: Lives with: lives alone Lives in: House/apartment Stairs: Yes: External: 3 steps; none Has following equipment at home: Single point cane   OCCUPATION: retired   PLOF: Independent  PATIENT GOALS: walk normally and as pain free as able  NEXT MD VISIT: June 2025  OBJECTIVE:  Note: Objective measures were completed at Evaluation unless otherwise noted.  MRI IMPRESSION: 1. No acute findings or clear explanation for the patient's symptoms. Overall findings are similar to previous CT from 2020. 2. Interval development of a healed mild superior endplate compression deformity at L1 since 2020. No acute osseous findings. 3. Multilevel spondylosis as described, most advanced at L4-5 where there is mild spinal stenosis and asymmetric lateral recess and foraminal narrowing on the left with suspected chronic left L4 nerve root encroachment. 4. Mild asymmetric lateral recess and foraminal narrowing on the left at L5-S1 without definite nerve root encroachment.   LOWER EXTREMITY ROM:   Active ROM Right eval Left eval Left  11/17/23 Right 2/28 Left 2/28 AROM/PROM Left 4/21 AROM  L 5/12  L 6/19 L 6/30  L 8/14 L 06/21/24  Hip flexion                  Hip extension                  Hip abduction                  Hip adduction                  Hip internal rotation                  Hip external rotation                  Knee flexion  Knee extension                  Ankle dorsiflexion   -8 Lacking 2 14 Lacking 7 deg / 3  with pain Lacking 1 degree/pain with AAROM  4 1 (calf tightness) 10deg 6 10*  Ankle plantarflexion   30d 42 65 55 65deg 65 54 55 51 48*  Ankle inversion   Limited by 50%   43 24 12 17 22 24 25  19*  Ankle eversion   Limited by 50%   10 2 with pain 12 5 pain 11 15 11  12*   (Blank rows = not tested)  LEFS 4/21: 14/80 LEFS: Lower Extremity Functional Score: 30 / 80 = 37.5 % ODI Score = 17 points (34%)  LEFS 6/19: 31/80 6/19 Modified Owsestry: 50% 8/14 MODI: 30% 8/`4: LEFS:24/80   MMT 4+ throughout L ankle with pain into EV MMT 6/19: 5/5 DF and PF, 4+/5 In,ev.  Hip flexion 11.1lbs R, 17.7, abduction 11.1R, 19L (seated)  06/21/24  Ankle DF L 4+/5, inversion 4+/5, eversion 4+/5 Shoulder strength: flexion 4+/5 B, ABD 4+/5     Back/neck ROM 06/21/24  Cervical AROM: extension about 25% limited, flexion full but painful in collar bones, lateral flexion at least 50% limited B, rotation about 50% limited B  Thoracic AROM: flexion 90% limited, extension WNL but painful/some lumbar popping, lateral flexion 50% limited B left more painful, rotation 50% limited B Lumbar AROM: 50% limited, extension 90% limited painful, lateral flexion R WNL L 50% limited, rotation R 50% limited L 80% limited  Rounded shoulders, thoracic kyphosis noted     TODAY'S TREATMENT:   07/16/24: Pt received Gentle PROM to L ankle in DF, PF, Inv, and Eve w/n pt tolerance Attempted inv/eve on half roll though stopped due to pain Attempted Seated BAPS lvl2 DF/PF though stopped due to pain Seated heel raises 2x10 Seated row GTB 2x15 Seated bil ER RTB 2x12 Seated horizontal  abduction RTB  2x10 Pec stretch with hands behind head 3x15 sec in sitting  Manual Therapy:  STM to bilat thoracic and lumbar paraspinals in prone.  Static  cupping to bilat lumbar paraspinals and gentle gliding with smaller cup to R sided lumbar paraspinals in prone.    07/13/24: Seated BAPS board Lvl 2 DF/PF 2x10, Eve/Inv x 10.  Attempted circles though stopped due to pain Pec stretch with hands behind head 3x10-15 sec (2 seated, 1 standing) Seated row GTB 2x15 Seated bil ER RTB 2x10 Seated horizontal abduction RTB  2x10 Seated heel raises 2x10 Gentle PROM to L ankle in DF, PF, Inv, and Eve w/n pt tolerance  Manual Therapy:  STM to bilat thoracic and lumbar paraspinals in prone   07/09/24: Seated heel raises 2x10 Seated wobble board x10 ant/post, 2x10 lateral Seated UT stretch 2x30 seconds B -Levator stretching 2x20sec x2ea -Seated bil ER RTB 2x10 -Seated horizontal abduction RTB  2x10 Seated row GTB 2x15 -seated W 2x10 -pec stretch (hands behind back and rolling shoulder posteriorly) 10sec hold x5 Seated shoulder shrugs x10   07/06/24: Pt received L ankle DF, PF, Inv, and Eve PROM per pt and tissue tolerance. Ankle Inv/Eve on half roll Wobble board F/B seated  Seated row GTB 2x10 Seated ER with retraction with RTB 2x10 Seated shoulder hz abd with RTB x 10 Seated UT stretch 2x30 sec bilat Seated LS stretch 2x30 sec bilat Posterior shoulder rolls approx 5 reps (Pt had pain) Seated heel raises 2x10  07/02/24: Seated UT stretch 2x30  seconds B -Posterior rols (pain in ACJ) -Levator stretching 2x20sec x2ea -Seated bil ER RTB 2x10 -Seated horizontal abduction RTB  2x10 Seated row GTB x20 -seated W 2x10  06/29/24  UBE L2.5 x4 min forward/4 min backward    Seated UT stretch 2x30 seconds B Levator stretch 1x30 seconds B  SCM stretch 1x30 seconds B  Scap retractions 12x3 seconds  Thoracic flexion + rotation stretch 10x3 seconds B Thoracic extension + BUE ER for pec  stretch x10  Modified QL stretch 3x10 seconds B  TA sets seated 15x2 seconds  Backward shoulder rolls x15   06/25/24 -STM (gentle) to post tib and peroneal area -Gentle PROM L ankle -Ankle AROM x10ea -Standing row GTB 2x10 (required seated break in between due to foot/ankle pain) -Seated bil ER RTB x10 -Seated horizontal abduction RT x10 -seated wall angel x10 -HPE update/review -Education and instruction on desensitization techniques and self STM. Education on postural awareness and correction with daily ativities  06/21/24  Thoracic eval, re-measured ankle and lumbar for cert/POC   Education on findings, POC moving forward in context of upcoming breast reduction surgery, HEP for thoracic region and neck  UBE L2.5 x3 min forward, 3 min backward  Thoracic rotation ROM Thoracic flexion + rotation stretch Thoracic/lumbar extension stretch     06/19/24 Manual Therapy:  STM to L plantar fascia   - Cross friction massage to tarsal tunnel area with no tolerance due to hypersensitvity - STM to posterior popliteal region due to possible compression of sciatic nerve.  - Game ready to address swelling and nerve compression in tarsal tunnel.  - Discussion about possible adverse reactions to medications, tarsal tunnel syndrome.      PATIENT EDUCATION:  Education details:  exercise form, relevant anatomy, POC, and rationale of interventions. Person educated: Patient Education method: Explanation, Demonstration, Tactile cues, Verbal cues Education comprehension: verbalized understanding, returned demonstration, verbal cues required, tactile cues required, and needs further education     HOME EXERCISE PROGRAM: Access Code: [email or web address] URL: [email or web address] Date: 06/22/24 Prepared by: Josette Rough      ASSESSMENT:  CLINICAL IMPRESSION:    Pt presents to treatment reporting increased ankle pain and swelling.  PT attempted ankle exercises to improve ROM  though pt did not tolerate exercises well.  PT had pt stop ankle exercises due to pain.  PT performed gentle PROM to L ankle.  Pt tolerated postural stab's/strengthening exercises well.  She reports her back pain is improving.  Pt had improved tolerance with STM and palpation though was tender in L sided thoracic paraspinals.  PT performed cupping to lumbar paraspinals and pt had an appropriate response.  She had multiple petichiae with cupping.  PT instructed pt to drink plenty of water today due to the cupping.  Pt states she feels much better after manual techniques.    OBJECTIVE IMPAIRMENTS: Abnormal gait, decreased activity tolerance, decreased balance, difficulty walking, decreased ROM, decreased strength, obesity, and pain.   ACTIVITY LIMITATIONS: squatting, stairs, and locomotion level  PARTICIPATION LIMITATIONS: shopping and community activity  PERSONAL FACTORS: Past/current experiences and 1 comorbidity: back dysfunction are also affecting patient's functional outcome.   REHAB POTENTIAL: Good  CLINICAL DECISION MAKING: Evolving/moderate complexity  EVALUATION COMPLEXITY: Moderate   GOALS:  SHORT TERM GOALS: Target date: 07/13/2024   Pt will tolerate full aquatic sessions consistently without increase in pain and with improving function to demonstrate good toleration and effectiveness of intervention.  Baseline: Goal status: Met 11/24/23  2.  Pt will navigate stair into  and out of pool 6 steps using step through pattern Baseline: step-to pattern Goal status: MET   3.  Pt will report reduction in pain by 3 NPRS post aquatic session which lasts for up to 24 hours. Baseline:  Goal status: Met 11/24/23  4.  Pt will amb without antalgic gait pattern submerged in 3.6 ft Baseline:  Goal status:MET - 11/17/23  5.  Pt will demo improved quality of gait including increased toe off and stance time on L LE with reduced favoring of L LE. Goal status: ongoing 06/22/24 Target date:     6.  Pt will demo improved L ankle AROM to at least 0 deg in DF and 7 deg in Eversion for improved stiffness and gait.  Goal status: MET 6/19 Target date:  01/23/2024     LONG TERM GOALS: Target date: 08/03/2024      Pt will demonstrate an improvement in LEFS by at least 9 points for a clinically significant improvement in self perceived disability.  Baseline: 30/80 Goal status: MET  2.  Pt wiil navigate 1 flight of steps using step alternating pattern indep Baseline:  Goal status: ongoing 06/22/24  3.  Pt will demonstrate improved DF AROM to be w/n 5 deg of R ankle for improved stiffness, gait, and performance of stairs.  Baseline: see chart Goal status: MET 7/17  4.  Pt will demo left ankle strength to be at least 4/5 in DF and WFL in PF (tested with manual resistance in open chain) in order for improved performance of and tolerance with functional mobility Baseline: see chart Goal status:  MET  5.  Pt will amb without AD without limitation to pain Baseline:using cane or walker  Goal status: MET 7/17  6.  Pt will have improved Oswestry score to >/= 43/50 to demo MCID Baseline: Oswestry Score: 21 / 50 or 42 % Goal status: ongoing (25/50 on 6/19)  7.  Pt will be able to tolerate standing tasks for >/=15 min for household chores/tasks Baseline: 2 min Goal status: MET (max 7/17)  8.  Pt will be able to demo increased hip strength to at least 4/5 for improved standing tolerance Baseline: See MMT above Goal status: ongoing   9. Thoracic and lumbar AROM to be no more than 25% limited all planes of motion Goal status: NEW  10. Will demonstrate improved awareness of functional posture Goal status: NEW  11. Periscap muscle strength (middle trap, lower trap, etc) to be at least 4/5 Goal status: NEW    PLAN:  PT FREQUENCY: 2x/wk  PT DURATION: 6 weeks     PLANNED INTERVENTIONS: [id number]- PT Re-evaluation, 97110-Therapeutic exercises, 97530- Therapeutic activity,  97112- Neuromuscular re-education, 97535- Self Care, [id number]- Manual therapy, [phone]- Gait training, [phone]- Orthotic Fit/training, [phone]- Aquatic Therapy, Patient/Family education, Balance training, Stair training, Taping, Dry Needling, Joint mobilization, DME instructions, Cryotherapy, and Moist heat  PLAN FOR NEXT SESSION:  Cont with ankle ROM.  Gait training.  Continue gross hip and core strengthening. Soft tissue work to lumbar paraspinals.  Cont cupping to lumbar paraspinals. Thoraco-cervical interventions, postural training.  Pt is having surgery on 10/1 to remove the cyst.  Leigh Minerva III PT, DPT 07/16/24 8:08 PM

## 2024-07-18 ENCOUNTER — Encounter (HOSPITAL_BASED_OUTPATIENT_CLINIC_OR_DEPARTMENT_OTHER): Payer: Self-pay

## 2024-07-19 ENCOUNTER — Encounter (HOSPITAL_BASED_OUTPATIENT_CLINIC_OR_DEPARTMENT_OTHER): Admitting: Internal Medicine

## 2024-07-20 ENCOUNTER — Other Ambulatory Visit: Payer: Self-pay | Admitting: Podiatry

## 2024-07-20 ENCOUNTER — Encounter: Payer: Self-pay | Admitting: Podiatry

## 2024-07-20 ENCOUNTER — Encounter (HOSPITAL_BASED_OUTPATIENT_CLINIC_OR_DEPARTMENT_OTHER)

## 2024-07-20 DIAGNOSIS — M7989 Other specified soft tissue disorders: Secondary | ICD-10-CM

## 2024-07-21 ENCOUNTER — Ambulatory Visit (HOSPITAL_BASED_OUTPATIENT_CLINIC_OR_DEPARTMENT_OTHER)

## 2024-07-21 ENCOUNTER — Encounter (HOSPITAL_BASED_OUTPATIENT_CLINIC_OR_DEPARTMENT_OTHER): Payer: Self-pay

## 2024-07-21 DIAGNOSIS — M5459 Other low back pain: Secondary | ICD-10-CM

## 2024-07-21 DIAGNOSIS — M25672 Stiffness of left ankle, not elsewhere classified: Secondary | ICD-10-CM

## 2024-07-21 DIAGNOSIS — M546 Pain in thoracic spine: Secondary | ICD-10-CM

## 2024-07-21 DIAGNOSIS — M6281 Muscle weakness (generalized): Secondary | ICD-10-CM

## 2024-07-21 DIAGNOSIS — M25572 Pain in left ankle and joints of left foot: Secondary | ICD-10-CM

## 2024-07-21 NOTE — Therapy (Signed)
 OUTPATIENT PHYSICAL THERAPY THORACIC TREATMENT    Patient Name: Haley White MRN: [id number] DOB:1956-03-16, 68 y.o., female Today's Date: 07/21/2024  END OF SESSION:    PT End of Session - 07/21/24 1613     Visit Number 50    Number of Visits 54    Date for PT Re-Evaluation 08/03/24    Authorization Type [employer] mcr    Progress Note Due on Visit 51    PT Start Time 1515    PT Stop Time 1600    PT Time Calculation (min) 45 min    Activity Tolerance Patient tolerated treatment well    Behavior During Therapy WFL for tasks assessed/performed                    Past Medical History:  Diagnosis Date   Anxiety    Arthritis    Asthma    Cataract    Depression    Gout    Hyperlipidemia    Hypertension    Obese    Sleep apnea    Past Surgical History:  Procedure Laterality Date   ANKLE SURGERY Left    APPENDECTOMY  1984   APPENDECTOMY     CATARACT EXTRACTION W/ INTRAOCULAR LENS  IMPLANT, BILATERAL  2014   CESAREAN SECTION  1984, 1987, 1994   X3   CESAREAN SECTION     x3   COLONOSCOPY  11/16/2013   w/Brodie    EXTERNAL FIXATION LEG Left 06/04/2023   Procedure: EXTERNAL FIXATION LEG;  Surgeon: Barton Drape, MD;  Location: [facility];  Service: Orthopedics;  Laterality: Left;   EYE SURGERY Bilateral    lasix   FOOT FRACTURE SURGERY Left    FOOT SURGERY Left 2002   FRACTURE SURGERY Left    left ankle   HARDWARE REMOVAL Left 12/03/2023   Procedure: HARDWARE REMOVAL OF SYNDEMOSIS;  Surgeon: Barton Drape, MD;  Location: [facility];  Service: Orthopedics;  Laterality: Left;   KNEE SURGERY Left 1998   ORIF ANKLE FRACTURE Left 06/04/2023   Procedure: OPEN REDUCTION INTERNAL FIXATION (ORIF) ANKLE FRACTURE vs EX FIX;  Surgeon: Barton Drape, MD;  Location: [facility];  Service: Orthopedics;  Laterality: Left;   ROTATOR CUFF REPAIR Right 2007   shoulder     torn rotator cuff   TUBAL LIGATION  1994   TUBAL LIGATION     Patient Active  Problem List   Diagnosis Date Noted   Neck pain 05/31/2024   Symptomatic mammary hypertrophy 05/31/2024   Prediabetes 04/22/2024   History of thromboembolism 04/22/2024   OSA on CPAP 08/11/2023   Ankle fracture 06/03/2023   Trimalleolar fracture of left ankle 06/02/2023   Acute left ankle pain 06/02/2023   Sleep-related hypoxia 03/24/2023   At risk for obstructive sleep apnea 03/24/2023   Acute pulmonary embolism (HCC) 05/10/2022   Allergic rhinitis 03/15/2021   Gastro-esophageal reflux disease without esophagitis 03/15/2021   Mild persistent asthma, uncomplicated 03/15/2021   Aortic atherosclerosis (HCC) by Chest CT scan on 02/02/2018  01/10/2021   Asthma    Back pain 03/17/2019   Lumbosacral spondylosis without myelopathy 03/17/2019   Pseudogout 12/09/2017   Major depressive disorder, recurrent, severe without psychotic features (HCC)    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   Suicide attempt by drug ingestion (HCC)    Vitamin D  deficiency 08/22/2014   Hyperlipidemia, mixed 08/22/2014   Hypertension    Anxiety    Obesity, unspecified     PCP: Elsie Richards MD  REFERRING  PROVIDER: Barton Drape, MD (for ankle) Laqueta Sharper, MD (for low back)  REFERRING DIAG: S82.852D (ICD-10-CM) - Displaced trimalleolar fracture of left lower leg, subsequent encounter for closed fracture with routine healing   THERAPY DIAG:  Stiffness of left ankle, not elsewhere classified  Muscle weakness (generalized)  Pain in left ankle and joints of left foot  Other low back pain  Pain in thoracic spine  Rationale for Evaluation and Treatment: Rehabilitation  ONSET DATE: July 2024  SUBJECTIVE:   SUBJECTIVE STATEMENT:  Pt reports her foot has been hurting more today since she got up. Reports her BP was high at doctor's office today. I think it's because my pain level is so high. Waiting to schedule MRI. MD wonders if there's a tumor in there. Back has been doing  okay.     PERTINENT HISTORY: The patient is s/p left ankle trimalleolar & syndesmosis ORIF, DOS: 06/05/2023.  Removal of hardware on 04/26/24.  Removal of deep hardware on 12/03/2023.   Hx of foot/ankle surgery in 2022  Fibromyalgia, HTN, PE s/p covid in 2023 LBP with MRI (10/2022) evidence of chronic mild anterior wedge compression fracture of T12 and chronic anterior wedge compression deformity of L1   PAIN:  Are you having pain? Yes:  NPRS scale: 4/10 central and R sided lumbar pain currently ; 7/10 L ankle/foot pain    PRECAUTIONS: None   WEIGHT BEARING RESTRICTIONS: WBAT  FALLS:  Has patient fallen in last 6 months? Yes. Number of falls 1 fell down wet hill walking dog  LIVING ENVIRONMENT: Lives with: lives alone Lives in: House/apartment Stairs: Yes: External: 3 steps; none Has following equipment at home: Single point cane   OCCUPATION: retired   PLOF: Independent  PATIENT GOALS: walk normally and as pain free as able  NEXT MD VISIT: June 2025  OBJECTIVE:  Note: Objective measures were completed at Evaluation unless otherwise noted.  MRI IMPRESSION: 1. No acute findings or clear explanation for the patient's symptoms. Overall findings are similar to previous CT from 2020. 2. Interval development of a healed mild superior endplate compression deformity at L1 since 2020. No acute osseous findings. 3. Multilevel spondylosis as described, most advanced at L4-5 where there is mild spinal stenosis and asymmetric lateral recess and foraminal narrowing on the left with suspected chronic left L4 nerve root encroachment. 4. Mild asymmetric lateral recess and foraminal narrowing on the left at L5-S1 without definite nerve root encroachment.   LOWER EXTREMITY ROM:   Active ROM Right eval Left eval Left  11/17/23 Right 2/28 Left 2/28 AROM/PROM Left 4/21 AROM  L 5/12  L 6/19 L 6/30 L 8/14 L 06/21/24  Hip flexion                  Hip extension                   Hip abduction                  Hip adduction                  Hip internal rotation                  Hip external rotation                  Knee flexion                  Knee extension  Ankle dorsiflexion   -8 Lacking 2 14 Lacking 7 deg / 3  with pain Lacking 1 degree/pain with AAROM  4 1 (calf tightness) 10deg 6 10*  Ankle plantarflexion   30d 42 65 55 65deg 65 54 55 51 48*  Ankle inversion   Limited by 50%   43 24 12 17 22 24 25  19*  Ankle eversion   Limited by 50%   10 2 with pain 12 5 pain 11 15 11  12*   (Blank rows = not tested)  LEFS 4/21: 14/80 LEFS: Lower Extremity Functional Score: 30 / 80 = 37.5 % ODI Score = 17 points (34%)  LEFS 6/19: 31/80 6/19 Modified Owsestry: 50% 8/14 MODI: 30% 8/`4: LEFS:24/80   MMT 4+ throughout L ankle with pain into EV MMT 6/19: 5/5 DF and PF, 4+/5 In,ev.  Hip flexion 11.1lbs R, 17.7, abduction 11.1R, 19L (seated)  06/21/24  Ankle DF L 4+/5, inversion 4+/5, eversion 4+/5 Shoulder strength: flexion 4+/5 B, ABD 4+/5     Back/neck ROM 06/21/24  Cervical AROM: extension about 25% limited, flexion full but painful in collar bones, lateral flexion at least 50% limited B, rotation about 50% limited B  Thoracic AROM: flexion 90% limited, extension WNL but painful/some lumbar popping, lateral flexion 50% limited B left more painful, rotation 50% limited B Lumbar AROM: 50% limited, extension 90% limited painful, lateral flexion R WNL L 50% limited, rotation R 50% limited L 80% limited  Rounded shoulders, thoracic kyphosis noted     TODAY'S TREATMENT:   07/21/24 Toe scrunches 2x10 Seated heel raise 2x10  UT stretching 30sec x2ea Seated row GTB 2x15 Seated bil ER RTB 2x12 Seated horizontal abduction RTB  2x12 Seated W 2x10 Pulleys abduction for lat stretch, flexion x75min Seated lumbar flexion stretch 4x20sec  07/16/24: Pt received Gentle PROM to L ankle in DF, PF, Inv, and Eve w/n pt tolerance Attempted  inv/eve on half roll though stopped due to pain Attempted Seated BAPS lvl2 DF/PF though stopped due to pain Seated heel raises 2x10 Seated row GTB 2x15 Seated bil ER RTB 2x12 Seated horizontal abduction RTB  2x10 Pec stretch with hands behind head 3x15 sec in sitting  Manual Therapy:  STM to bilat thoracic and lumbar paraspinals in prone.  Static  cupping to bilat lumbar paraspinals and gentle gliding with smaller cup to R sided lumbar paraspinals in prone.    07/13/24: Seated BAPS board Lvl 2 DF/PF 2x10, Eve/Inv x 10.  Attempted circles though stopped due to pain Pec stretch with hands behind head 3x10-15 sec (2 seated, 1 standing) Seated row GTB 2x15 Seated bil ER RTB 2x10 Seated horizontal abduction RTB  2x10 Seated heel raises 2x10 Gentle PROM to L ankle in DF, PF, Inv, and Eve w/n pt tolerance  Manual Therapy:  STM to bilat thoracic and lumbar paraspinals in prone   07/09/24: Seated heel raises 2x10 Seated wobble board x10 ant/post, 2x10 lateral Seated UT stretch 2x30 seconds B -Levator stretching 2x20sec x2ea -Seated bil ER RTB 2x10 -Seated horizontal abduction RTB  2x10 Seated row GTB 2x15 -seated W 2x10 -pec stretch (hands behind back and rolling shoulder posteriorly) 10sec hold x5 Seated shoulder shrugs x10   07/06/24: Pt received L ankle DF, PF, Inv, and Eve PROM per pt and tissue tolerance. Ankle Inv/Eve on half roll Wobble board F/B seated  Seated row GTB 2x10 Seated ER with retraction with RTB 2x10 Seated shoulder hz abd with RTB x 10 Seated UT stretch 2x30 sec  bilat Seated LS stretch 2x30 sec bilat Posterior shoulder rolls approx 5 reps (Pt had pain) Seated heel raises 2x10  07/02/24: Seated UT stretch 2x30 seconds B -Posterior rols (pain in ACJ) -Levator stretching 2x20sec x2ea -Seated bil ER RTB 2x10 -Seated horizontal abduction RTB  2x10 Seated row GTB x20 -seated W 2x10   PATIENT EDUCATION:  Education details:  exercise form, relevant anatomy,  POC, and rationale of interventions. Person educated: Patient Education method: Explanation, Demonstration, Tactile cues, Verbal cues Education comprehension: verbalized understanding, returned demonstration, verbal cues required, tactile cues required, and needs further education     HOME EXERCISE PROGRAM: Access Code: [email or web address] URL: [email or web address] Date: 06/22/24 Prepared by: Josette Rough      ASSESSMENT:  CLINICAL IMPRESSION:    Pt continues to experience high pain levels in dorsal L foot. Unable to tolerate most interventions for this today, so focused more of thoracic area. She tolerated seated NMR exercises for postural strength and re-ed well with L foot propped up on stool. Felt tightness in bilateral lat area with pulley stretching, citing this helped her muscles loosen up afterwards. She is awaiting MRI for L foot/ankle as well as a surgery scheduled for 10/1. Meets with breast reduction MD soon.     OBJECTIVE IMPAIRMENTS: Abnormal gait, decreased activity tolerance, decreased balance, difficulty walking, decreased ROM, decreased strength, obesity, and pain.   ACTIVITY LIMITATIONS: squatting, stairs, and locomotion level  PARTICIPATION LIMITATIONS: shopping and community activity  PERSONAL FACTORS: Past/current experiences and 1 comorbidity: back dysfunction are also affecting patient's functional outcome.   REHAB POTENTIAL: Good  CLINICAL DECISION MAKING: Evolving/moderate complexity  EVALUATION COMPLEXITY: Moderate   GOALS:  SHORT TERM GOALS: Target date: 07/13/2024   Pt will tolerate full aquatic sessions consistently without increase in pain and with improving function to demonstrate good toleration and effectiveness of intervention.  Baseline: Goal status: Met 11/24/23  2.  Pt will navigate stair into and out of pool 6 steps using step through pattern Baseline: step-to pattern Goal status: MET   3.  Pt will report reduction in pain by 3  NPRS post aquatic session which lasts for up to 24 hours. Baseline:  Goal status: Met 11/24/23  4.  Pt will amb without antalgic gait pattern submerged in 3.6 ft Baseline:  Goal status:MET - 11/17/23  5.  Pt will demo improved quality of gait including increased toe off and stance time on L LE with reduced favoring of L LE. Goal status: ongoing 06/22/24 Target date:    6.  Pt will demo improved L ankle AROM to at least 0 deg in DF and 7 deg in Eversion for improved stiffness and gait.  Goal status: MET 6/19 Target date:  01/23/2024     LONG TERM GOALS: Target date: 08/03/2024      Pt will demonstrate an improvement in LEFS by at least 9 points for a clinically significant improvement in self perceived disability.  Baseline: 30/80 Goal status: MET  2.  Pt wiil navigate 1 flight of steps using step alternating pattern indep Baseline:  Goal status: ongoing 06/22/24  3.  Pt will demonstrate improved DF AROM to be w/n 5 deg of R ankle for improved stiffness, gait, and performance of stairs.  Baseline: see chart Goal status: MET 7/17  4.  Pt will demo left ankle strength to be at least 4/5 in DF and WFL in PF (tested with manual resistance in open chain) in order for improved performance of and tolerance with functional mobility Baseline:  see chart Goal status:  MET  5.  Pt will amb without AD without limitation to pain Baseline:using cane or walker  Goal status: MET 7/17  6.  Pt will have improved Oswestry score to >/= 43/50 to demo MCID Baseline: Oswestry Score: 21 / 50 or 42 % Goal status: ongoing (25/50 on 6/19)  7.  Pt will be able to tolerate standing tasks for >/=15 min for household chores/tasks Baseline: 2 min Goal status: MET (max 7/17)  8.  Pt will be able to demo increased hip strength to at least 4/5 for improved standing tolerance Baseline: See MMT above Goal status: ongoing   9. Thoracic and lumbar AROM to be no more than 25% limited all planes of  motion Goal status: NEW  10. Will demonstrate improved awareness of functional posture Goal status: NEW  11. Periscap muscle strength (middle trap, lower trap, etc) to be at least 4/5 Goal status: NEW    PLAN:  PT FREQUENCY: 2x/wk  PT DURATION: 6 weeks     PLANNED INTERVENTIONS: [id number]- PT Re-evaluation, 97110-Therapeutic exercises, 97530- Therapeutic activity, 97112- Neuromuscular re-education, 97535- Self Care, [id number]- Manual therapy, [phone]- Gait training, [phone]- Orthotic Fit/training, [phone]- Aquatic Therapy, Patient/Family education, Balance training, Stair training, Taping, Dry Needling, Joint mobilization, DME instructions, Cryotherapy, and Moist heat  PLAN FOR NEXT SESSION:  Cont with ankle ROM.  Gait training.  Continue gross hip and core strengthening. Soft tissue work to lumbar paraspinals.  Cont cupping to lumbar paraspinals. Thoraco-cervical interventions, postural training.  Pt is having surgery on 10/1 to remove the cyst.  Asberry Rodes, PTA  07/21/24 4:20 PM

## 2024-07-23 ENCOUNTER — Ambulatory Visit: Payer: Medicare HMO | Admitting: Nurse Practitioner

## 2024-07-26 ENCOUNTER — Telehealth: Payer: Self-pay | Admitting: Lab

## 2024-07-26 ENCOUNTER — Ambulatory Visit (INDEPENDENT_AMBULATORY_CARE_PROVIDER_SITE_OTHER): Admitting: Internal Medicine

## 2024-07-26 ENCOUNTER — Encounter (HOSPITAL_BASED_OUTPATIENT_CLINIC_OR_DEPARTMENT_OTHER): Payer: Self-pay | Admitting: Physical Therapy

## 2024-07-26 ENCOUNTER — Encounter (INDEPENDENT_AMBULATORY_CARE_PROVIDER_SITE_OTHER): Payer: Self-pay | Admitting: Internal Medicine

## 2024-07-26 VITALS — BP 146/87 | HR 73 | Temp 98.3°F | Ht 62.0 in | Wt 235.0 lb

## 2024-07-26 DIAGNOSIS — R7303 Prediabetes: Secondary | ICD-10-CM | POA: Diagnosis not present

## 2024-07-26 DIAGNOSIS — G4733 Obstructive sleep apnea (adult) (pediatric): Secondary | ICD-10-CM | POA: Diagnosis not present

## 2024-07-26 DIAGNOSIS — Z6841 Body Mass Index (BMI) 40.0 and over, adult: Secondary | ICD-10-CM

## 2024-07-26 DIAGNOSIS — R0602 Shortness of breath: Secondary | ICD-10-CM

## 2024-07-26 DIAGNOSIS — R5383 Other fatigue: Secondary | ICD-10-CM

## 2024-07-26 DIAGNOSIS — Z1331 Encounter for screening for depression: Secondary | ICD-10-CM

## 2024-07-26 DIAGNOSIS — I10 Essential (primary) hypertension: Secondary | ICD-10-CM | POA: Diagnosis not present

## 2024-07-26 DIAGNOSIS — E782 Mixed hyperlipidemia: Secondary | ICD-10-CM

## 2024-07-26 DIAGNOSIS — D649 Anemia, unspecified: Secondary | ICD-10-CM

## 2024-07-26 DIAGNOSIS — Z86718 Personal history of other venous thrombosis and embolism: Secondary | ICD-10-CM

## 2024-07-26 DIAGNOSIS — E66813 Obesity, class 3: Secondary | ICD-10-CM

## 2024-07-26 NOTE — Assessment & Plan Note (Signed)
 She is on antiplatelet therapy no anticoagulation.  No history of recurrence.  Obesity does increase the risk of thromboembolism losing weight may reduce that risk.  Also increasing physical activity.

## 2024-07-26 NOTE — Assessment & Plan Note (Signed)
 Obesity is a chronic condition with a BMI of 43, contributing to hypertension, sleep apnea, asthma, prediabetes, and arthritis. Genetic, environmental, and lifestyle factors influence her obesity. The goal is a 10-15% weight reduction to improve health outcomes, including reducing biomechanical forces and cardiometabolic complications.  Obesity management is being initiated with a focus on lifestyle changes, including dietary modifications and physical activity. She has tried various weight loss programs with initial success but subsequent weight regain. Her goal is to reduce her weight to 150 pounds within a year. Emphasized gradual weight loss of approximately one pound per week to avoid muscle loss and ensure sustainability. Currently not exercising due to foot pain but is interested in resuming walking and swimming. Discussed challenges of frequent dining out and importance of healthier choices. - Initiate 1200-calorie diet plan with emphasis on protein intake. - Encourage three meals a day to prevent metabolism slowdown. - Advise on healthier eating out choices and reducing frequency of eating out. - Recommend bulk cooking and using meal replacements as needed. - Suggest using [employer] for recipes and [employer] for prepackaged meals. - Encourage resuming physical activity, such as walking and swimming, as foot pain allows. - Reviewed role of antiobesity medications

## 2024-07-26 NOTE — Progress Notes (Signed)
 [address] [place],  [place], [place] [address]  Office: [phone]  /  Fax: [phone]   Subjective   Initial Visit  Haley White (MR# [id number]) is a 68 y.o. female who presents for evaluation Haley treatment of obesity Haley related comorbidities. Current BMI is Body mass index is 42.98 kg/m. Haley White has been struggling with her weight for many years Haley has been unsuccessful in either losing weight, maintaining weight loss, or reaching her healthy weight goal.  Haley White is currently in the action stage of change Haley ready to dedicate time achieving Haley maintaining a healthier weight. Haley White is interested in becoming our patient Haley working on intensive lifestyle modifications including (but not limited to) diet Haley exercise for weight loss.  Weight history:  History of Present Illness   Haley White is a 68 year old female with obesity, hypertension, Haley sleep apnea who presents for medical weight management.   She has a history of obesity with a current BMI of 43, struggling with weight since childhood. Her highest weight was 235 pounds. Previous weight loss attempts through Weight Wat[employer] were initially successful until a fall last summer led to a trimalleolar fracture of her ankle, resulting in prolonged immobility Haley weight regain. She is highly motivated to lose weight.   She has hypertension, prediabetes, high cholesterol, Haley asthma.    She has sleep apnea Haley uses a CPAP machine. She experiences significant oxygen drops during sleep but is not on supplemental oxygen.   She experiences low back pain Haley a major hip issue, which led to her referral for weight management.   Her medication list includes Wellbutrin , Prozac , Haley Ritalin . She is not currently following a specific diet but has used Weight Watchers in the past. She has not previously used weight loss medications.   She has a history of acid reflux Haley overactive bladder symptoms, which she associates with her weight. She also  mentions a history of arthritis, which she describes as 'bad arthritis everywhere.'       She was referred by: Specialist - Dr. Burnetta   When asked what else they would like to accomplish? She states: Adopt healthier eating patterns, Improve energy levels Haley physical activity, Improve existing medical conditions, Improve quality of life, Haley Improve self-confidence   When asked how has your weight affected you? She states: Contributed to medical problems, Contributed to orthopedic problems or mobility issues, Having fatigue, Haley Having poor endurance   Weight history: See above   Highest weight: 235   Some associated conditions: Hypertension, Arthritis: Multiple joints, OSA, Prediabetes, Lung disease, Haley Venous thromboembolism   Contributing factors: family history of obesity, disruption of circadian rhythm / sleep disordered breathing, consumption of processed foods, reduced physical acitivity, eating patterns, mental health problems, Haley menopause   Weight promoting medications identified: None   Prior weight loss attempts: Weight Watchers, Keto   Current nutrition plan: None   Current level of physical activity: Limited due to chronic pain or orthopedic problems   Current or previous pharmacotherapy: None Haley Is interested in pharmacotherapy   Response to medication: Never tried medications   Discussed the use of AI scribe software for clinical note transcription with the patient, who gave verbal consent to proceed.  History of Present Illness  She has a history of obesity Haley has previously attempted various weight loss methods, including Slimfast, Weight Watchers, Haley a low-carb diet at [facility], where she was checked for ketosis. Despite initial success, she regained  the weight. She aims to reduce her weight to 150 pounds within a year.  She is scheduled for foot surgery next week to remove a cyst, which will allow her to bear some weight  post-surgery. This will be her sixth surgery on the same foot in three years. Her foot pain has been a significant barrier to exercise, although she previously enjoyed walking Haley swimming. She currently participates in physical therapy twice a week Haley wants to return to activities like walking Haley aqua aerobics.  She has a history of sleep apnea Haley uses a CPAP machine nightly. She reports severe sleep apnea Haley experiences daytime sleepiness with a score of eight. She is unsure if her oxygen levels have been tested overnight while on CPAP.  Her past medical history includes prediabetes, asthma, acid reflux, high blood pressure, Haley a history of pulmonary embolism. She was on blood thinners for six months following the embolism but now only takes aspirin . She is on lisinopril  for hypertension Haley rosuvastatin  40 mg for cholesterol management. She also takes vitamin D  daily, having previously doubled her dose due to low levels.  Her family history is significant for cardiometabolic conditions including diabetes, high blood pressure, high cholesterol, Haley heart disease. Her paternal aunt was very overweight. 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 She eats out frequently, about seven times a week, Haley has a history of smoking for six years, which she has since quit.  In terms of diet, she typically consumes two meals a day, often skipping breakfast or lunch. She drinks diet sodas, black coffee, skim milk, Haley cranberry juice occasionally, Haley has mixed drinks or wine two to three times a week. She acknowledges eating when stressed or bored Haley craves seafood, particularly salmon.    Nutritional History:  Current nutrition plan: None.  How many times do you eat outside the home: > 7 per week  How often do they skip meals: skips breakfast Haley skips lunch  What beverages do they drink: caffeinated beverages , diet soda , juice, smoothies,  Haley milk.   Use of artificial sweetners : No  Food intolerances or dislikes: none.  Food triggers: Stress, Boredom, Haley When feeling guilty.  Food cravings: seafood  Do they struggle with excessive hunger or portion control : No    Physical Activity:  Current level of physical activity: Limited due to chronic pain or orthopedic problems  Barriers to Exercise: orthopedic problems   Past medical history includes:   Past Medical History:  Diagnosis Date   ADHD    Allergy     Anxiety    Arthritis    Asthma    Back pain    Cataract    Chest pain    Depression    Fibromyalgia    GERD (gastroesophageal reflux disease)    Gout    Hx pulmonary embolism    Hyperlipidemia    Hypertension    Joint pain    Obese    Osteoarthritis    Pain in left ankle    Palpitations    Pre-diabetes    Sleep apnea    SOB (shortness of breath)    Vitamin D  deficiency      Objective   BP (!) 146/87   Pulse 73   Temp 98.3 F (36.8 C)   Ht 5' 2 (1.575 m)   Wt 235 lb (106.6 kg)   LMP 11/26/2011   SpO2 99%   BMI 42.98  kg/m  She was weighed on the bioimpedance scale: Body mass index is 42.98 kg/m.    Anthropometrics:  Vitals Temp: 98.3 F (36.8 C) BP: (!) 146/87 Pulse Rate: 73 SpO2: 99 %   Anthropometric Measurements Height: 5' 2 (1.575 m) Weight: 235 lb (106.6 kg) BMI (Calculated): 42.97 Starting Weight: 235 lb Peak Weight: 245 lb Waist Measurement : 49 inches   Body Composition  Body Fat %: 50.9 % Fat Mass (lbs): 119.8 lbs Muscle Mass (lbs): 109.8 lbs Total Body Water (lbs): 80.2 lbs Visceral Fat Rating : 18   Other Clinical Data RMR: [id number] Fasting: yes Labs: yes Today's Visit #: 1 Starting Date: 07/26/24    Physical Exam:  General: She is overweight, cooperative, alert, well developed, Haley in no acute distress. PSYCH: Has normal mood, affect Haley thought process.   HEENT: EOMI, sclerae are anicteric. Lungs: Normal breathing effort, no  conversational dyspnea. Extremities: No edema.  Neurologic: No gross sensory or motor deficits. No tremors or fasciculations noted.    Diagnostic Data Reviewed  EKG: Normal sinus rhythm, rate 71 bpm. No conduction abnormalities, abnormal Q waves or chamber enlargement.  Indirect Calorimeter completed today shows a VO2 of 231 Haley a REE of 1598.  Her calculated basal metabolic rate is 8348 thus her resting energy expenditure slower than calculated.  Depression Screen  Haley White's PHQ-9 score was: 0.     07/26/2024    9:04 AM  Depression screen PHQ 2/9  Decreased Interest 0  Down, Depressed, Hopeless 0  PHQ - 2 Score 0  Altered sleeping 0  Tired, decreased energy 0  Change in appetite 0  Feeling bad or failure about yourself  0  Trouble concentrating 0  Moving slowly or fidgety/restless 0  Suicidal thoughts 0  PHQ-9 Score 0    Screening for Sleep Related Breathing Disorders  Haley White denies daytime somnolence Haley denies waking up still tired. Patient had a history of symptoms of OSA, but not current symptoms due to wearing CPCP. Haley White generally gets 7 or 8 hours of sleep per night, Haley states that she has generally restful sleep wearing the CPAP. Snoring is present. Apneic episodes are present. Epworth Sleepiness Score is 8.   BMET    Component Value Date/Time   NA 142 06/29/2024 1737   NA 144 10/31/2021 1420   K 3.6 06/29/2024 1737   CL 107 06/29/2024 1737   CO2 24 06/29/2024 1707   GLUCOSE 121 (H) 06/29/2024 1737   BUN 14 06/29/2024 1737   BUN 12 10/31/2021 1420   CREATININE 0.90 06/29/2024 1737   CREATININE 0.82 10/24/2023 1124   CALCIUM  9.2 06/29/2024 1707   GFRNONAA >60 06/29/2024 1707   GFRNONAA 56 (L) 01/10/2021 0852   GFRAA 65 01/10/2021 0852   Lab Results  Component Value Date   HGBA1C 6.2 (H) 10/24/2023   HGBA1C 5.9 (H) 03/30/2015   No results found for: INSULIN  CBC    Component Value Date/Time   WBC 7.8 06/29/2024 1707   RBC 3.87 06/29/2024 1707   HGB  11.9 (L) 06/29/2024 1737   HCT 35.0 (L) 06/29/2024 1737   PLT 254 06/29/2024 1707   MCV 94.1 06/29/2024 1707   MCV 97.5 (A) 11/28/2013 1334   MCH 31.0 06/29/2024 1707   MCHC 33.0 06/29/2024 1707   RDW 12.0 06/29/2024 1707   Iron/TIBC/Ferritin/ %Sat    Component Value Date/Time   IRON 101 01/10/2021 0852   TIBC 267 01/10/2021 0852   IRONPCTSAT 38 01/10/2021 0852   Lipid  Panel     Component Value Date/Time   CHOL 215 (H) 10/24/2023 1124   TRIG 163 (H) 10/24/2023 1124   HDL 97 10/24/2023 1124   CHOLHDL 2.2 10/24/2023 1124   VLDL 21 03/30/2015 0926   LDLCALC 92 10/24/2023 1124   Hepatic Function Panel     Component Value Date/Time   PROT 6.5 10/24/2023 1124   ALBUMIN 3.6 06/04/2023 0121   AST 22 10/24/2023 1124   ALT 30 (H) 10/24/2023 1124   ALKPHOS 56 06/04/2023 0121   BILITOT 0.8 10/24/2023 1124   BILIDIR 0.2 10/08/2017 1144   IBILI 0.8 10/08/2017 1144      Component Value Date/Time   TSH 0.95 01/22/2023 1152     Assessment Haley Plan   TREATMENT PLAN FOR OBESITY:  Recommended Dietary Goals  Haley White is currently in the action stage of change. As such, her goal is to implement medically supervised obesity management plan.  She has agreed to implement: the Category 2 plan - 1200 kcal per day  Behavioral Intervention  We discussed the following Behavioral Modification Strategies today: increasing lean protein intake to established goals, decreasing simple carbohydrates , increasing vegetables, increasing lower glycemic fruits, increasing fiber rich foods, avoiding skipping meals, increasing water intake, work on meal planning Haley preparation, reading food labels , keeping healthy foods at home, identifying sources Haley decreasing liquid calories, decreasing eating out or consumption of processed foods, Haley making healthy choices when eating convenient foods, planning for success, Haley better snacking choices  Additional resources provided today: Handout on healthy eating  Haley balanced plate, Handout on complex carbohydrates Haley lean sources of protein, Category 2 packet, Haley Handout principles of weight management  Recommended Physical Activity Goals  Haley White has been advised to work up to 150 minutes of moderate intensity aerobic activity a week Haley strengthening exercises 2-3 times per week for cardiovascular health, weight loss maintenance Haley preservation of muscle mass.   She has agreed to :  Think about enjoyable ways to increase daily physical activity Haley overcoming barriers to exercise Haley Increase physical activity in their day Haley reduce sedentary time (increase NEAT).  Medical Interventions Haley Pharmacotherapy We will work on building a Therapist, art Haley behavioral strategies. We will discuss the role of pharmacotherapy as an adjunct at subsequent visits.   ASSOCIATED CONDITIONS ADDRESSED TODAY  Other Fatigue Haley White denies daytime somnolence Haley denies waking up still tired. Patient had a history of symptoms of OSA, but not current symptoms due to wearing CPCP. Haley White generally gets 7 or 8 hours of sleep per night, Haley states that she has generally restful sleep wearing the CPAP. Snoring is present. Apneic episodes are present. Epworth Sleepiness Score is 8. Haley White does feel that her weight is causing her energy to be lower than it should be. Fatigue may be related to obesity, depression or many other causes. Labs will be ordered, Haley in the meanwhile, Saskia will focus on self care including making healthy food choices, increasing physical activity Haley focusing on stress reduction.  Shortness of Breath Haley White notes increasing shortness of breath with physical activity Haley seems to be worsening over time with weight gain. She notes getting out of breath sooner with activity than she used to. This has not gotten worse recently. Keren denies shortness of breath at rest or orthopnea.  Assessment & Plan Primary hypertension Blood pressure is  not at goal.  She is currently on methylphenidate , trazodone  Haley bupropion  which may increase blood pressure.  She  is currently on lisinopril .  She has increased cardiovascular risk.  Losing 10% of body weight may improve blood pressure control but she may need treatment intensification.  I will defer medication adjustments to primary care team.  We will check renal parameters today OSA on CPAP According to patient she has severe sleep apnea with nighttime hypoxemia is not on oxygen.  She still has an elevated Epworth Haley uncontrolled blood pressure.  May be worthwhile doing an overnight oxygen test to see if she needs oxygen supplementation. Prediabetes She has an A1c of 6.2 Haley elevated insulin  levels.  We discussed the carb insulin  model for obesity Haley she will be started on a low-carb moderate protein calorie reduced meal plan.  We discussed reducing simple Haley added sugars in diet Haley also avoiding diet drinks Haley artificial sweeteners.  She may benefit from incretin therapy Haley or prophylaxis with metformin.  This will be discussed further at the next office visit. Hyperlipidemia, mixed She is currently on rosuvastatin  without any adverse effects.  We will check fasting lipid profile today Haley assess cardiovascular risk further at the next office visit. SOB (shortness of breath) on exertion  Other fatigue  Depression screen  History of thromboembolism She is on antiplatelet therapy no anticoagulation.  No history of recurrence.  Obesity does increase the risk of thromboembolism losing weight may reduce that risk.  Also increasing physical activity. Anemia, unspecified type Patient was found to have mild anemia on his CBC.  We will check iron studies with her labs. Class 3 severe obesity with serious comorbidity Haley body mass index (BMI) of 40.0 to 44.9 in adult, unspecified obesity type Obesity is a chronic condition with a BMI of 43, contributing to hypertension, sleep apnea, asthma,  prediabetes, Haley arthritis. Genetic, environmental, Haley lifestyle factors influence her obesity. The goal is a 10-15% weight reduction to improve health outcomes, including reducing biomechanical forces Haley cardiometabolic complications.  Obesity management is being initiated with a focus on lifestyle changes, including dietary modifications Haley physical activity. She has tried various weight loss programs with initial success but subsequent weight regain. Her goal is to reduce her weight to 150 pounds within a year. Emphasized gradual weight loss of approximately one pound per week to avoid muscle loss Haley ensure sustainability. Currently not exercising due to foot pain but is interested in resuming walking Haley swimming. Discussed challenges of frequent dining out Haley importance of healthier choices. - Initiate 1200-calorie diet plan with emphasis on protein intake. - Encourage three meals a day to prevent metabolism slowdown. - Advise on healthier eating out choices Haley reducing frequency of eating out. - Recommend bulk cooking Haley using meal replacements as needed. - Suggest using [employer] for recipes Haley [employer] for prepackaged meals. - Encourage resuming physical activity, such as walking Haley swimming, as foot pain allows. - Reviewed role of antiobesity medications     Follow-up  She was informed of the importance of frequent follow-up visits to maximize her success with intensive lifestyle modifications for her multiple health conditions. She was informed we would discuss her lab results at her next visit unless there is a critical issue that needs to be addressed sooner. White agreed to keep her next visit at the agreed upon time to discuss these results.  Attestation Statement  This is the patient's intake visit at [facility]. The patient's Health Questionnaire was reviewed at length. Included in the packet: current Haley past health history,  medications,  allergies, ROS, gynecologic history (women only), surgical history, family history, social history, weight history, weight loss surgery history (for those that have had weight loss surgery), nutritional evaluation, mood Haley food questionnaire, PHQ9, Epworth questionnaire, sleep habits questionnaire, patient life Haley health improvement goals questionnaire. These will all be scanned into the patient's chart under media.   During the visit, I independently reviewed the patient's, previous labs, bioimpedance scale results, Haley indirect calorimetry results. I used this information to medically tailor a meal plan for the patient that will help her to lose weight Haley will improve her obesity-related conditions. I performed a medically necessary appropriate examination Haley/or evaluation. I discussed the assessment Haley treatment plan with the patient. The patient was provided an opportunity to ask questions Haley all were answered. The patient agreed with the plan Haley demonstrated an understanding of the instructions. Labs were ordered at this visit Haley will be reviewed at the next visit unless critical results need to be addressed immediately. Clinical information was updated Haley documented in the EMR.   In addition, they received basic education on identification of processed foods Haley reduction of these, different sources of lean proteins Haley complex carbohydrates Haley how to eat balanced by incorporation of whole foods.  Reviewed by clinician on day of visit: allergies, medications, problem list, medical history, surgical history, family history, social history, Haley previous encounter notes.  I have spent 70 minutes in the care of the patient today including: 5 minutes before the visit reviewing Haley preparing the chart. 55 minutes face-to-face assessing Haley reviewing listed medical problems as outlined in obesity care plan, providing nutritional Haley behavioral counseling on topics outlined in the  obesity care plan, independently interpreting test results Haley goals of care, as described in assessment Haley plan, reviewing Haley discussing biometric information Haley progress, Haley ordering diagnostics - see orders 10 minutes after the visit updating chart Haley documentation of encounter.       Lucas Parker, MD

## 2024-07-26 NOTE — Assessment & Plan Note (Signed)
 According to patient she has severe sleep apnea with nighttime hypoxemia is not on oxygen.  She still has an elevated Epworth and uncontrolled blood pressure.  May be worthwhile doing an overnight oxygen test to see if she needs oxygen supplementation.

## 2024-07-26 NOTE — Telephone Encounter (Signed)
 Patient called stated is scheduled for surgery on 08/04/2024 but MRI is not scheduled until 08/03/2024 6 pm was the earliest they can get her in just wanted you to be aware.

## 2024-07-26 NOTE — Assessment & Plan Note (Signed)
 She has an A1c of 6.2 and elevated insulin  levels.  We discussed the carb insulin  model for obesity and she will be started on a low-carb moderate protein calorie reduced meal plan.  We discussed reducing simple and added sugars in diet and also avoiding diet drinks and artificial sweeteners.  She may benefit from incretin therapy and or prophylaxis with metformin.  This will be discussed further at the next office visit.

## 2024-07-26 NOTE — Assessment & Plan Note (Signed)
 Blood pressure is not at goal.  She is currently on methylphenidate , trazodone  and bupropion  which may increase blood pressure.  She is currently on lisinopril .  She has increased cardiovascular risk.  Losing 10% of body weight may improve blood pressure control but she may need treatment intensification.  I will defer medication adjustments to primary care team.  We will check renal parameters today

## 2024-07-26 NOTE — Assessment & Plan Note (Signed)
 She is currently on rosuvastatin  without any adverse effects.  We will check fasting lipid profile today and assess cardiovascular risk further at the next office visit.

## 2024-07-27 ENCOUNTER — Ambulatory Visit (HOSPITAL_BASED_OUTPATIENT_CLINIC_OR_DEPARTMENT_OTHER): Admitting: Physical Therapy

## 2024-07-27 ENCOUNTER — Encounter (HOSPITAL_BASED_OUTPATIENT_CLINIC_OR_DEPARTMENT_OTHER): Payer: Self-pay | Admitting: Physical Therapy

## 2024-07-27 DIAGNOSIS — M25672 Stiffness of left ankle, not elsewhere classified: Secondary | ICD-10-CM | POA: Diagnosis not present

## 2024-07-27 DIAGNOSIS — M546 Pain in thoracic spine: Secondary | ICD-10-CM

## 2024-07-27 DIAGNOSIS — R262 Difficulty in walking, not elsewhere classified: Secondary | ICD-10-CM

## 2024-07-27 DIAGNOSIS — M6281 Muscle weakness (generalized): Secondary | ICD-10-CM

## 2024-07-27 DIAGNOSIS — M25572 Pain in left ankle and joints of left foot: Secondary | ICD-10-CM

## 2024-07-27 DIAGNOSIS — M5459 Other low back pain: Secondary | ICD-10-CM

## 2024-07-27 NOTE — Therapy (Signed)
 OUTPATIENT PHYSICAL THERAPY THORACIC TREATMENT    Patient Name: Haley White MRN: [id number] DOB:05-24-1956, 68 y.o., female Today's Date: 07/28/2024  END OF SESSION:    PT End of Session - 07/27/24 0951     Visit Number 51    Number of Visits 54    Date for Recertification  08/03/24    Authorization Type aetna mcr    PT Start Time 0945    PT Stop Time 1024    PT Time Calculation (min) 39 min    Activity Tolerance Patient tolerated treatment well    Behavior During Therapy WFL for tasks assessed/performed                    Past Medical History:  Diagnosis Date   ADHD    Allergy     Anxiety    Arthritis    Asthma    Back pain    Cataract    Chest pain    Depression    Fibromyalgia    GERD (gastroesophageal reflux disease)    Gout    Hx pulmonary embolism    Hyperlipidemia    Hypertension    Joint pain    Obese    Osteoarthritis    Pain in left ankle    Palpitations    Pre-diabetes    Sleep apnea    SOB (shortness of breath)    Vitamin D  deficiency    Past Surgical History:  Procedure Laterality Date   ANKLE SURGERY Left    APPENDECTOMY  1984   APPENDECTOMY     CATARACT EXTRACTION W/ INTRAOCULAR LENS  IMPLANT, BILATERAL  2014   CESAREAN SECTION  1984, 1987, 1994   X3   CESAREAN SECTION     x3   COLONOSCOPY  11/16/2013   w/Brodie    EXTERNAL FIXATION LEG Left 06/04/2023   Procedure: EXTERNAL FIXATION LEG;  Surgeon: Barton Drape, MD;  Location: [facility];  Service: Orthopedics;  Laterality: Left;   EYE SURGERY Bilateral    lasix   FOOT FRACTURE SURGERY Left    FOOT SURGERY Left 2002   FRACTURE SURGERY Left    left ankle   HARDWARE REMOVAL Left 12/03/2023   Procedure: HARDWARE REMOVAL OF SYNDEMOSIS;  Surgeon: Barton Drape, MD;  Location: [facility];  Service: Orthopedics;  Laterality: Left;   KNEE SURGERY Left 1998   ORIF ANKLE FRACTURE Left 06/04/2023   Procedure: OPEN REDUCTION INTERNAL FIXATION (ORIF) ANKLE  FRACTURE vs EX FIX;  Surgeon: Barton Drape, MD;  Location: [facility];  Service: Orthopedics;  Laterality: Left;   ROTATOR CUFF REPAIR Right 2007   shoulder     torn rotator cuff   TUBAL LIGATION  1994   TUBAL LIGATION     Patient Active Problem List   Diagnosis Date Noted   Neck pain 05/31/2024   Symptomatic mammary hypertrophy 05/31/2024   Prediabetes 04/22/2024   History of thromboembolism 04/22/2024   OSA on CPAP 08/11/2023   Ankle fracture 06/03/2023   Trimalleolar fracture of left ankle 06/02/2023   Acute left ankle pain 06/02/2023   Sleep-related hypoxia 03/24/2023   At risk for obstructive sleep apnea 03/24/2023   Acute pulmonary embolism (HCC) 05/10/2022   Allergic rhinitis 03/15/2021   Gastro-esophageal reflux disease without esophagitis 03/15/2021   Mild persistent asthma, uncomplicated 03/15/2021   Aortic atherosclerosis (HCC) by Chest CT scan on 02/02/2018  01/10/2021   Asthma    Back pain 03/17/2019   Lumbosacral spondylosis without myelopathy 03/17/2019  Pseudogout 12/09/2017   Major depressive disorder, recurrent, severe without psychotic features (HCC)    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   Suicide attempt by drug ingestion (HCC)    Vitamin D  deficiency 08/22/2014   Hyperlipidemia, mixed 08/22/2014   Hypertension    Anxiety    Obesity, unspecified     PCP: Elsie Richards MD  REFERRING PROVIDER: Barton Drape, MD (for ankle) Laqueta Sharper, MD (for low back)  REFERRING DIAG: S82.852D (ICD-10-CM) - Displaced trimalleolar fracture of left lower leg, subsequent encounter for closed fracture with routine healing   THERAPY DIAG:  Other low back pain  Pain in thoracic spine  Muscle weakness (generalized)  Pain in left ankle and joints of left foot  Difficulty in walking, not elsewhere classified  Rationale for Evaluation and Treatment: Rehabilitation  ONSET DATE: July 2024  SUBJECTIVE:   SUBJECTIVE STATEMENT:  Pt states her foot  continues to hurt.  It's going to rain.  Pt states MD wants a MRI prior to foot surgery.  Pt denies any adverse effects after piror Rx.     PERTINENT HISTORY: The patient is s/p left ankle trimalleolar & syndesmosis ORIF, DOS: 06/05/2023.  Removal of hardware on 04/26/24.  Removal of deep hardware on 12/03/2023.   Hx of foot/ankle surgery in 2022  Fibromyalgia, HTN, PE s/p covid in 2023 LBP with MRI (10/2022) evidence of chronic mild anterior wedge compression fracture of T12 and chronic anterior wedge compression deformity of L1   PAIN:  Are you having pain? Yes:  NPRS scale: 3/10 R sided lumbar pain currently ; 6/10 L ankle/foot pain    PRECAUTIONS: None   WEIGHT BEARING RESTRICTIONS: WBAT  FALLS:  Has patient fallen in last 6 months? Yes. Number of falls 1 fell down wet hill walking dog  LIVING ENVIRONMENT: Lives with: lives alone Lives in: House/apartment Stairs: Yes: External: 3 steps; none Has following equipment at home: Single point cane   OCCUPATION: retired   PLOF: Independent  PATIENT GOALS: walk normally and as pain free as able  NEXT MD VISIT: June 2025  OBJECTIVE:  Note: Objective measures were completed at Evaluation unless otherwise noted.  MRI IMPRESSION: 1. No acute findings or clear explanation for the patient's symptoms. Overall findings are similar to previous CT from 2020. 2. Interval development of a healed mild superior endplate compression deformity at L1 since 2020. No acute osseous findings. 3. Multilevel spondylosis as described, most advanced at L4-5 where there is mild spinal stenosis and asymmetric lateral recess and foraminal narrowing on the left with suspected chronic left L4 nerve root encroachment. 4. Mild asymmetric lateral recess and foraminal narrowing on the left at L5-S1 without definite nerve root encroachment.   LOWER EXTREMITY ROM:   Active ROM Right eval Left eval Left  11/17/23 Right 2/28 Left 2/28 AROM/PROM  Left 4/21 AROM  L 5/12  L 6/19 L 6/30 L 8/14 L 06/21/24  Hip flexion                  Hip extension                  Hip abduction                  Hip adduction                  Hip internal rotation                  Hip external rotation  Knee flexion                  Knee extension                  Ankle dorsiflexion   -8 Lacking 2 14 Lacking 7 deg / 3  with pain Lacking 1 degree/pain with AAROM  4 1 (calf tightness) 10deg 6 10*  Ankle plantarflexion   30d 42 65 55 65deg 65 54 55 51 48*  Ankle inversion   Limited by 50%   43 24 12 17 22 24 25  19*  Ankle eversion   Limited by 50%   10 2 with pain 12 5 pain 11 15 11  12*   (Blank rows = not tested)  LEFS 4/21: 14/80 LEFS: Lower Extremity Functional Score: 30 / 80 = 37.5 % ODI Score = 17 points (34%)  LEFS 6/19: 31/80 6/19 Modified Owsestry: 50% 8/14 MODI: 30% 8/`4: LEFS:24/80   MMT 4+ throughout L ankle with pain into EV MMT 6/19: 5/5 DF and PF, 4+/5 In,ev.  Hip flexion 11.1lbs R, 17.7, abduction 11.1R, 19L (seated)  06/21/24  Ankle DF L 4+/5, inversion 4+/5, eversion 4+/5 Shoulder strength: flexion 4+/5 B, ABD 4+/5     Back/neck ROM 06/21/24  Cervical AROM: extension about 25% limited, flexion full but painful in collar bones, lateral flexion at least 50% limited B, rotation about 50% limited B  Thoracic AROM: flexion 90% limited, extension WNL but painful/some lumbar popping, lateral flexion 50% limited B left more painful, rotation 50% limited B Lumbar AROM: 50% limited, extension 90% limited painful, lateral flexion R WNL L 50% limited, rotation R 50% limited L 80% limited  Rounded shoulders, thoracic kyphosis noted     TODAY'S TREATMENT:   07/27/24 Manual Therapy: Reviewed pt presentation, pain level, and response to prior Rx. PT educated pt's daughter and instructed her how to performing cupping.  Pt's daughter observed cupping.  PT answered pt's daughter questions. Manual Therapy:  STM to  bilat lumbar and thoracic paraspinals f/b static and dynamic cupping in prone   Pt's daughter had questions concerning stairs and PT educated with proper sequencing for stairs.  Pt performed stairs with rail.  PT also educated pt and daughter in how to use a cane with stairs.    07/21/24 Toe scrunches 2x10 Seated heel raise 2x10  UT stretching 30sec x2ea Seated row GTB 2x15 Seated bil ER RTB 2x12 Seated horizontal abduction RTB  2x12 Seated W 2x10 Pulleys abduction for lat stretch, flexion x60min Seated lumbar flexion stretch 4x20sec  07/16/24: Pt received Gentle PROM to L ankle in DF, PF, Inv, and Eve w/n pt tolerance Attempted inv/eve on half roll though stopped due to pain Attempted Seated BAPS lvl2 DF/PF though stopped due to pain Seated heel raises 2x10 Seated row GTB 2x15 Seated bil ER RTB 2x12 Seated horizontal abduction RTB  2x10 Pec stretch with hands behind head 3x15 sec in sitting  Manual Therapy:  STM to bilat thoracic and lumbar paraspinals in prone.  Static  cupping to bilat lumbar paraspinals and gentle gliding with smaller cup to R sided lumbar paraspinals in prone.    07/13/24: Seated BAPS board Lvl 2 DF/PF 2x10, Eve/Inv x 10.  Attempted circles though stopped due to pain Pec stretch with hands behind head 3x10-15 sec (2 seated, 1 standing) Seated row GTB 2x15 Seated bil ER RTB 2x10 Seated horizontal abduction RTB  2x10 Seated heel raises 2x10 Gentle PROM to L ankle in DF,  PF, Inv, and Eve w/n pt tolerance  Manual Therapy:  STM to bilat thoracic and lumbar paraspinals in prone   07/09/24: Seated heel raises 2x10 Seated wobble board x10 ant/post, 2x10 lateral Seated UT stretch 2x30 seconds B -Levator stretching 2x20sec x2ea -Seated bil ER RTB 2x10 -Seated horizontal abduction RTB  2x10 Seated row GTB 2x15 -seated W 2x10 -pec stretch (hands behind back and rolling shoulder posteriorly) 10sec hold x5 Seated shoulder shrugs x10   07/06/24: Pt  received L ankle DF, PF, Inv, and Eve PROM per pt and tissue tolerance. Ankle Inv/Eve on half roll Wobble board F/B seated  Seated row GTB 2x10 Seated ER with retraction with RTB 2x10 Seated shoulder hz abd with RTB x 10 Seated UT stretch 2x30 sec bilat Seated LS stretch 2x30 sec bilat Posterior shoulder rolls approx 5 reps (Pt had pain) Seated heel raises 2x10   PATIENT EDUCATION:  Education details:  exercise form, relevant anatomy, POC, and rationale of interventions. Person educated: Patient Education method: Explanation, Demonstration, Verbal cues Education comprehension: verbalized understanding, returned demonstration, verbal cues required, tactile cues required, and needs further education     HOME EXERCISE PROGRAM: Access Code: [email or web address] URL: [email or web address] Date: 06/22/24 Prepared by: Josette Rough      ASSESSMENT:  CLINICAL IMPRESSION:    Pt reported that her daughter was coming in today to learn how to perform cupping to Pt's back.  PT performed static and dynamic cupping to thoracic and lumbar paraspinals and educated pt's daughter in how to perform.  PT instructed pt's daughter in how to perform cupping and answered her questions. She verbalized understanding.  Pt's daughter also had questions concerning stair performance.  PT demonstrated and instructed pt in ascending and descending stairs.  PT answered pt's daughter's questions with stairs and also educated them in how to use a cane with stairs.  Pt responded well to treatment stating her back feels better after treatment and her foot feels the same.  Pt is having L foot surgery to remove cyst in October.      OBJECTIVE IMPAIRMENTS: Abnormal gait, decreased activity tolerance, decreased balance, difficulty walking, decreased ROM, decreased strength, obesity, and pain.   ACTIVITY LIMITATIONS: squatting, stairs, and locomotion level  PARTICIPATION LIMITATIONS: shopping and community  activity  PERSONAL FACTORS: Past/current experiences and 1 comorbidity: back dysfunction are also affecting patient's functional outcome.   REHAB POTENTIAL: Good  CLINICAL DECISION MAKING: Evolving/moderate complexity  EVALUATION COMPLEXITY: Moderate   GOALS:  SHORT TERM GOALS: Target date: 07/13/2024   Pt will tolerate full aquatic sessions consistently without increase in pain and with improving function to demonstrate good toleration and effectiveness of intervention.  Baseline: Goal status: Met 11/24/23  2.  Pt will navigate stair into and out of pool 6 steps using step through pattern Baseline: step-to pattern Goal status: MET   3.  Pt will report reduction in pain by 3 NPRS post aquatic session which lasts for up to 24 hours. Baseline:  Goal status: Met 11/24/23  4.  Pt will amb without antalgic gait pattern submerged in 3.6 ft Baseline:  Goal status:MET - 11/17/23  5.  Pt will demo improved quality of gait including increased toe off and stance time on L LE with reduced favoring of L LE. Goal status: ongoing 06/22/24 Target date:    6.  Pt will demo improved L ankle AROM to at least 0 deg in DF and 7 deg in Eversion for improved stiffness and gait.  Goal status: MET 6/19  Target date:  01/23/2024     LONG TERM GOALS: Target date: 08/03/2024      Pt will demonstrate an improvement in LEFS by at least 9 points for a clinically significant improvement in self perceived disability.  Baseline: 30/80 Goal status: MET  2.  Pt wiil navigate 1 flight of steps using step alternating pattern indep Baseline:  Goal status: ongoing 06/22/24  3.  Pt will demonstrate improved DF AROM to be w/n 5 deg of R ankle for improved stiffness, gait, and performance of stairs.  Baseline: see chart Goal status: MET 7/17  4.  Pt will demo left ankle strength to be at least 4/5 in DF and WFL in PF (tested with manual resistance in open chain) in order for improved performance of and  tolerance with functional mobility Baseline: see chart Goal status:  MET  5.  Pt will amb without AD without limitation to pain Baseline:using cane or walker  Goal status: MET 7/17  6.  Pt will have improved Oswestry score to >/= 43/50 to demo MCID Baseline: Oswestry Score: 21 / 50 or 42 % Goal status: ongoing (25/50 on 6/19)  7.  Pt will be able to tolerate standing tasks for >/=15 min for household chores/tasks Baseline: 2 min Goal status: MET (max 7/17)  8.  Pt will be able to demo increased hip strength to at least 4/5 for improved standing tolerance Baseline: See MMT above Goal status: ongoing   9. Thoracic and lumbar AROM to be no more than 25% limited all planes of motion Goal status: NEW  10. Will demonstrate improved awareness of functional posture Goal status: NEW  11. Periscap muscle strength (middle trap, lower trap, etc) to be at least 4/5 Goal status: NEW    PLAN:  PT FREQUENCY: 2x/wk  PT DURATION: 6 weeks     PLANNED INTERVENTIONS: [id number]- PT Re-evaluation, 97110-Therapeutic exercises, 97530- Therapeutic activity, 97112- Neuromuscular re-education, 97535- Self Care, [id number]- Manual therapy, [phone]- Gait training, [phone]- Orthotic Fit/training, [phone]- Aquatic Therapy, Patient/Family education, Balance training, Stair training, Taping, Dry Needling, Joint mobilization, DME instructions, Cryotherapy, and Moist heat  PLAN FOR NEXT SESSION:  Cont with ankle ROM.  Gait training.  Continue gross hip and core strengthening. Soft tissue work to lumbar paraspinals.  Cont cupping to lumbar paraspinals. Thoraco-cervical interventions, postural training.  Pt is having surgery on 10/1 to remove the cyst.  Leigh Minerva III PT, DPT 07/28/24 11:34 PM

## 2024-07-28 ENCOUNTER — Encounter: Payer: Self-pay | Admitting: Podiatry

## 2024-07-28 ENCOUNTER — Telehealth: Payer: Self-pay

## 2024-07-28 ENCOUNTER — Encounter (HOSPITAL_BASED_OUTPATIENT_CLINIC_OR_DEPARTMENT_OTHER): Payer: Self-pay | Admitting: Physical Therapy

## 2024-07-28 ENCOUNTER — Ambulatory Visit: Admitting: Student

## 2024-07-28 VITALS — BP 138/79 | HR 86 | Ht 63.0 in | Wt 234.6 lb

## 2024-07-28 DIAGNOSIS — N62 Hypertrophy of breast: Secondary | ICD-10-CM

## 2024-07-28 DIAGNOSIS — M549 Dorsalgia, unspecified: Secondary | ICD-10-CM

## 2024-07-28 DIAGNOSIS — Z6841 Body Mass Index (BMI) 40.0 and over, adult: Secondary | ICD-10-CM | POA: Diagnosis not present

## 2024-07-28 LAB — COMPREHENSIVE METABOLIC PANEL WITH GFR
ALT: 19 IU/L (ref 0–32)
AST: 16 IU/L (ref 0–40)
Albumin: 4.4 g/dL (ref 3.9–4.9)
Alkaline Phosphatase: 77 IU/L (ref 49–135)
BUN/Creatinine Ratio: 16 (ref 12–28)
BUN: 15 mg/dL (ref 8–27)
Bilirubin Total: 0.7 mg/dL (ref 0.0–1.2)
CO2: 25 mmol/L (ref 20–29)
Calcium: 9.1 mg/dL (ref 8.7–10.3)
Chloride: 105 mmol/L (ref 96–106)
Creatinine, Ser: 0.94 mg/dL (ref 0.57–1.00)
Globulin, Total: 1.8 g/dL (ref 1.5–4.5)
Glucose: 86 mg/dL (ref 70–99)
Potassium: 4.1 mmol/L (ref 3.5–5.2)
Sodium: 145 mmol/L — ABNORMAL HIGH (ref 134–144)
Total Protein: 6.2 g/dL (ref 6.0–8.5)
eGFR: 67 mL/min/1.73 (ref >59)

## 2024-07-28 LAB — LIPID PANEL WITH LDL/HDL RATIO
Cholesterol, Total: 175 mg/dL (ref 100–199)
HDL: 88 mg/dL (ref >39)
LDL Chol Calc (NIH): 71 mg/dL (ref 0–99)
LDL/HDL Ratio: 0.8 ratio (ref 0.0–3.2)
Triglycerides: 89 mg/dL (ref 0–149)
VLDL Cholesterol Cal: 16 mg/dL (ref 5–40)

## 2024-07-28 LAB — IRON AND TIBC
Iron Saturation: 33 % (ref 15–55)
Iron: 88 ug/dL (ref 27–139)
Total Iron Binding Capacity: 268 ug/dL (ref 250–450)
UIBC: 180 ug/dL (ref 118–369)

## 2024-07-28 LAB — HEMOGLOBIN A1C
Est. average glucose Bld gHb Est-mCnc: 123 mg/dL
Hgb A1c MFr Bld: 5.9 % — ABNORMAL HIGH (ref 4.8–5.6)

## 2024-07-28 LAB — VITAMIN D 25 HYDROXY (VIT D DEFICIENCY, FRACTURES): Vit D, 25-Hydroxy: 68.1 ng/mL (ref 30.0–100.0)

## 2024-07-28 LAB — FERRITIN: Ferritin: 172 ng/mL — ABNORMAL HIGH (ref 15–150)

## 2024-07-28 LAB — INSULIN, RANDOM: INSULIN: 16.6 u[IU]/mL (ref 2.6–24.9)

## 2024-07-28 LAB — VITAMIN B12: Vitamin B-12: 423 pg/mL (ref 232–1245)

## 2024-07-28 NOTE — Progress Notes (Signed)
 Referring Provider Laurice President, NP [address] [place],  [place] [address]   CC:  Chief Complaint  Patient presents with   Follow-up      Haley White is an 68 y.o. female.  HPI: Patient is a 68 y.o. year old female here for follow up after completing physical therapy for pain related to macromastia.   She was seen for initial consult by Dr. Lowery.  At that time, patient reported upper back and neck pain due to her enlarged breasts.  On exam, her STN on the right was 32 cm and her STN on the left was 33 cm.  Her BMI was 44.3 kg/m.  Her preoperative bra size was an E cup.  The estimated amount of breast tissue to be removed at the time of surgery was 750 to 800 g bilaterally.  At this visit, patient was currently doing physical therapy and was also planning on joining healthy weight and wellness.  The patient was found to be a good candidate for bilateral breast reduction with liposuction.  The plan was for her to come back after she finished physical therapy.  Patient was then seen again on 06/14/2024.  At this visit, patient reported that she had been going to physical therapy for pain related to macromastia.  Patient had just started seeing physical therapy for her macromastia the week of the appointment.  It was discussed with her that she would need to complete 6 sessions of physical therapy for her pain related to macromastia.  Today, patient reports she is doing well.  She states that she has been doing physical therapy for her upper back and has done about 6 sessions.  She feels that things have improved a little bit, but still is reporting back pain.  She denies any rashes under her breasts.  Patient reports that she would like to move forward with a breast reduction.  She does report that she is currently undergoing a cardiac workup.  She also states that she is having surgery with orthopedics for her left foot coming up next week.  She denies any fevers or chills.   Patient reports she is not a smoker.  She states that she does not take any blood thinners at this time.  Patient also states that she has been working with healthy weight and wellness.   Review of Systems General: Denies any fevers or chills MSK: Endorses ongoing back discomfort Skin: Denies rashes  Physical Exam    07/28/2024    9:12 AM 07/26/2024    8:00 AM 07/12/2024   10:24 AM  Vitals with BMI  Height 5' 3 5' 2 5' 3  Weight 234 lbs 10 oz 235 lbs 242 lbs 10 oz  BMI 41.57 42.97 42.99  Systolic 138 146 873  Diastolic 79 87 79  Pulse 86 73 76    General:  No acute distress,  Alert and oriented, Non-Toxic, Normal speech and affect Psych: Normal behavior and mood Respiratory: No increased WOB MSK: Ambulatory  Assessment/Plan  Patient is interested in pursuing surgical intervention for bilateral breast reduction. Patient has completed at least 6 weeks of physical therapy for pain related to macromastia.  I discussed with the patient that with her upcoming surgery, I recommended that we hold from submitting to insurance until she has completely recovered from her foot surgery.  We did discuss the process and that when we do submit to insurance for authorization, the approval can take up to 6  weeks.  She expressed understanding.  Will also go ahead and send clearance to her cardiologist to be prepared for surgery.  I instructed the patient to call if she has any questions or concerns about anything.  Patient to follow back up in 1 month for telephone visit.  Haley White 07/28/2024, 10:04 AM

## 2024-07-28 NOTE — Telephone Encounter (Signed)
   Pre-operative Risk Assessment    Patient Name: Haley White  DOB: 1956/09/07 MRN: [id number]   Date of last office visit: 07/01/24 JON HAILS, PA Date of next office visit: 08/18/24 JON HAILS, PA   Request for Surgical Clearance    Procedure:  BREAST REDUCTION  Date of Surgery:  Clearance TBD                                Surgeon:  DR ESTEFANA FRITTER Surgeon's Group or Practice Name:  [facility] Phone number:  [phone] Fax number:  [phone]   Type of Clearance Requested:   - Medical  - Pharmacy:  Hold Aspirin      Type of Anesthesia:  General    Additional requests/questions:    SignedLucie DELENA Ku   07/28/2024, 11:59 AM

## 2024-07-29 NOTE — Telephone Encounter (Signed)
 Irma- can you call [facility] to let them know this will need to be read stat once she gets the study done? The surgery is the next day.

## 2024-07-30 ENCOUNTER — Encounter (HOSPITAL_BASED_OUTPATIENT_CLINIC_OR_DEPARTMENT_OTHER): Payer: Self-pay | Admitting: Physical Therapy

## 2024-07-30 ENCOUNTER — Ambulatory Visit (HOSPITAL_BASED_OUTPATIENT_CLINIC_OR_DEPARTMENT_OTHER): Admitting: Physical Therapy

## 2024-07-30 DIAGNOSIS — M546 Pain in thoracic spine: Secondary | ICD-10-CM

## 2024-07-30 DIAGNOSIS — M25572 Pain in left ankle and joints of left foot: Secondary | ICD-10-CM

## 2024-07-30 DIAGNOSIS — R262 Difficulty in walking, not elsewhere classified: Secondary | ICD-10-CM

## 2024-07-30 DIAGNOSIS — M25672 Stiffness of left ankle, not elsewhere classified: Secondary | ICD-10-CM

## 2024-07-30 DIAGNOSIS — M5459 Other low back pain: Secondary | ICD-10-CM

## 2024-07-30 DIAGNOSIS — M6281 Muscle weakness (generalized): Secondary | ICD-10-CM

## 2024-07-30 NOTE — Telephone Encounter (Signed)
 MRI has been ordered stat.

## 2024-07-30 NOTE — Therapy (Signed)
 OUTPATIENT PHYSICAL THERAPY THORACIC TREATMENT    Patient Name: Haley White MRN: [id number] DOB:03/07/1956, 68 y.o., female Today's Date: 07/30/2024  END OF SESSION:    PT End of Session - 07/30/24 1035     Visit Number 52    Number of Visits 54    Date for Recertification  08/03/24    Authorization Type aetna mcr    Progress Note Due on Visit 51    PT Start Time 1018    PT Stop Time 1056    PT Time Calculation (min) 38 min    Activity Tolerance Patient tolerated treatment well    Behavior During Therapy WFL for tasks assessed/performed                     Past Medical History:  Diagnosis Date   ADHD    Allergy     Anxiety    Arthritis    Asthma    Back pain    Cataract    Chest pain    Depression    Fibromyalgia    GERD (gastroesophageal reflux disease)    Gout    Hx pulmonary embolism    Hyperlipidemia    Hypertension    Joint pain    Obese    Osteoarthritis    Pain in left ankle    Palpitations    Pre-diabetes    Sleep apnea    SOB (shortness of breath)    Vitamin D  deficiency    Past Surgical History:  Procedure Laterality Date   ANKLE SURGERY Left    APPENDECTOMY  1984   APPENDECTOMY     CATARACT EXTRACTION W/ INTRAOCULAR LENS  IMPLANT, BILATERAL  2014   CESAREAN SECTION  1984, 1987, 1994   X3   CESAREAN SECTION     x3   COLONOSCOPY  11/16/2013   w/Brodie    EXTERNAL FIXATION LEG Left 06/04/2023   Procedure: EXTERNAL FIXATION LEG;  Surgeon: Barton Drape, MD;  Location: [facility];  Service: Orthopedics;  Laterality: Left;   EYE SURGERY Bilateral    lasix   FOOT FRACTURE SURGERY Left    FOOT SURGERY Left 2002   FRACTURE SURGERY Left    left ankle   HARDWARE REMOVAL Left 12/03/2023   Procedure: HARDWARE REMOVAL OF SYNDEMOSIS;  Surgeon: Barton Drape, MD;  Location: [facility];  Service: Orthopedics;  Laterality: Left;   KNEE SURGERY Left 1998   ORIF ANKLE FRACTURE Left 06/04/2023   Procedure: OPEN REDUCTION  INTERNAL FIXATION (ORIF) ANKLE FRACTURE vs EX FIX;  Surgeon: Barton Drape, MD;  Location: [facility];  Service: Orthopedics;  Laterality: Left;   ROTATOR CUFF REPAIR Right 2007   shoulder     torn rotator cuff   TUBAL LIGATION  1994   TUBAL LIGATION     Patient Active Problem List   Diagnosis Date Noted   Neck pain 05/31/2024   Symptomatic mammary hypertrophy 05/31/2024   Prediabetes 04/22/2024   History of thromboembolism 04/22/2024   OSA on CPAP 08/11/2023   Ankle fracture 06/03/2023   Trimalleolar fracture of left ankle 06/02/2023   Acute left ankle pain 06/02/2023   Sleep-related hypoxia 03/24/2023   At risk for obstructive sleep apnea 03/24/2023   Acute pulmonary embolism (HCC) 05/10/2022   Allergic rhinitis 03/15/2021   Gastro-esophageal reflux disease without esophagitis 03/15/2021   Mild persistent asthma, uncomplicated 03/15/2021   Aortic atherosclerosis (HCC) by Chest CT scan on 02/02/2018  01/10/2021   Asthma  Back pain 03/17/2019   Lumbosacral spondylosis without myelopathy 03/17/2019   Pseudogout 12/09/2017   Major depressive disorder, recurrent, severe without psychotic features (HCC)    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   Suicide attempt by drug ingestion (HCC)    Vitamin D  deficiency 08/22/2014   Hyperlipidemia, mixed 08/22/2014   Hypertension    Anxiety    Obesity, unspecified     PCP: Elsie Richards MD  REFERRING PROVIDER: Barton Drape, MD (for ankle) Laqueta Sharper, MD (for low back)  REFERRING DIAG: S82.852D (ICD-10-CM) - Displaced trimalleolar fracture of left lower leg, subsequent encounter for closed fracture with routine healing   THERAPY DIAG:  Other low back pain  Pain in thoracic spine  Muscle weakness (generalized)  Pain in left ankle and joints of left foot  Difficulty in walking, not elsewhere classified  Stiffness of left ankle, not elsewhere classified  Rationale for Evaluation and Treatment:  Rehabilitation  ONSET DATE: July 2024  SUBJECTIVE:   SUBJECTIVE STATEMENT:  Pt states her foot continues to hurt, having surgery on 10/1 and going to ask MD about new order for PT after that procedure. They think there might be a tumor in there too, we will know after they get there and see what's going on. Not sure that the ankle will let us  do much today     PERTINENT HISTORY: The patient is s/p left ankle trimalleolar & syndesmosis ORIF, DOS: 06/05/2023.  Removal of hardware on 04/26/24.  Removal of deep hardware on 12/03/2023.   Hx of foot/ankle surgery in 2022  Fibromyalgia, HTN, PE s/p covid in 2023 LBP with MRI (10/2022) evidence of chronic mild anterior wedge compression fracture of T12 and chronic anterior wedge compression deformity of L1   PAIN:  Are you having pain? Yes:  NPRS scale: 6/10 R sided lumbar pain currently ; 6/10 L ankle/foot pain     PRECAUTIONS: None   WEIGHT BEARING RESTRICTIONS: WBAT  FALLS:  Has patient fallen in last 6 months? Yes. Number of falls 1 fell down wet hill walking dog  LIVING ENVIRONMENT: Lives with: lives alone Lives in: House/apartment Stairs: Yes: External: 3 steps; none Has following equipment at home: Single point cane   OCCUPATION: retired   PLOF: Independent  PATIENT GOALS: walk normally and as pain free as able  NEXT MD VISIT: June 2025  OBJECTIVE:  Note: Objective measures were completed at Evaluation unless otherwise noted.  MRI IMPRESSION: 1. No acute findings or clear explanation for the patient's symptoms. Overall findings are similar to previous CT from 2020. 2. Interval development of a healed mild superior endplate compression deformity at L1 since 2020. No acute osseous findings. 3. Multilevel spondylosis as described, most advanced at L4-5 where there is mild spinal stenosis and asymmetric lateral recess and foraminal narrowing on the left with suspected chronic left L4 nerve root encroachment. 4. Mild  asymmetric lateral recess and foraminal narrowing on the left at L5-S1 without definite nerve root encroachment.   LOWER EXTREMITY ROM:   Active ROM Right eval Left eval Left  11/17/23 Right 2/28 Left 2/28 AROM/PROM Left 4/21 AROM  L 5/12  L 6/19 L 6/30 L 8/14 L 06/21/24  Hip flexion                  Hip extension                  Hip abduction  Hip adduction                  Hip internal rotation                  Hip external rotation                  Knee flexion                  Knee extension                  Ankle dorsiflexion   -8 Lacking 2 14 Lacking 7 deg / 3  with pain Lacking 1 degree/pain with AAROM  4 1 (calf tightness) 10deg 6 10*  Ankle plantarflexion   30d 42 65 55 65deg 65 54 55 51 48*  Ankle inversion   Limited by 50%   43 24 12 17 22 24 25  19*  Ankle eversion   Limited by 50%   10 2 with pain 12 5 pain 11 15 11  12*   (Blank rows = not tested)  LEFS 4/21: 14/80 LEFS: Lower Extremity Functional Score: 30 / 80 = 37.5 % ODI Score = 17 points (34%)  LEFS 6/19: 31/80 6/19 Modified Owsestry: 50% 8/14 MODI: 30% 8/`4: LEFS:24/80   MMT 4+ throughout L ankle with pain into EV MMT 6/19: 5/5 DF and PF, 4+/5 In,ev.  Hip flexion 11.1lbs R, 17.7, abduction 11.1R, 19L (seated)  06/21/24  Ankle DF L 4+/5, inversion 4+/5, eversion 4+/5 Shoulder strength: flexion 4+/5 B, ABD 4+/5     Back/neck ROM 06/21/24  Cervical AROM: extension about 25% limited, flexion full but painful in collar bones, lateral flexion at least 50% limited B, rotation about 50% limited B  Thoracic AROM: flexion 90% limited, extension WNL but painful/some lumbar popping, lateral flexion 50% limited B left more painful, rotation 50% limited B Lumbar AROM: 50% limited, extension 90% limited painful, lateral flexion R WNL L 50% limited, rotation R 50% limited L 80% limited  Rounded shoulders, thoracic kyphosis noted     TODAY'S TREATMENT:   07/30/24  UBE L4 x4 minutes  forward/backward for w/u   Attempted ankle PROM, unable to tolerate due to pain  UT stretch 2x30 seconds B Levator stretch 2x30 seconds B  Thoracic extensions + pec stretch x10 Seated row green TB x15, blue TB x10 Shoulder extensions green TB x15, blue TB x10 QL stretch forward/L/R x30 seconds each (2 rounds) Seated TA sets 10x3 second holds Seated TA sets + march x10     07/27/24 Manual Therapy: Reviewed pt presentation, pain level, and response to prior Rx. PT educated pt's daughter and instructed her how to performing cupping.  Pt's daughter observed cupping.  PT answered pt's daughter questions. Manual Therapy:  STM to bilat lumbar and thoracic paraspinals f/b static and dynamic cupping in prone   Pt's daughter had questions concerning stairs and PT educated with proper sequencing for stairs.  Pt performed stairs with rail.  PT also educated pt and daughter in how to use a cane with stairs.    07/21/24 Toe scrunches 2x10 Seated heel raise 2x10  UT stretching 30sec x2ea Seated row GTB 2x15 Seated bil ER RTB 2x12 Seated horizontal abduction RTB  2x12 Seated W 2x10 Pulleys abduction for lat stretch, flexion x45min Seated lumbar flexion stretch 4x20sec  07/16/24: Pt received Gentle PROM to L ankle in DF, PF, Inv, and Eve w/n pt tolerance Attempted inv/eve on half roll though  stopped due to pain Attempted Seated BAPS lvl2 DF/PF though stopped due to pain Seated heel raises 2x10 Seated row GTB 2x15 Seated bil ER RTB 2x12 Seated horizontal abduction RTB  2x10 Pec stretch with hands behind head 3x15 sec in sitting  Manual Therapy:  STM to bilat thoracic and lumbar paraspinals in prone.  Static  cupping to bilat lumbar paraspinals and gentle gliding with smaller cup to R sided lumbar paraspinals in prone.      PATIENT EDUCATION:  Education details:  exercise form, relevant anatomy, POC, and rationale of interventions. Person educated: Patient Education method:  Explanation, Demonstration, Verbal cues Education comprehension: verbalized understanding, returned demonstration, verbal cues required, tactile cues required, and needs further education     HOME EXERCISE PROGRAM: Access Code: [email or web address] URL: [email or web address] Date: 06/22/24 Prepared by: Josette Rough      ASSESSMENT:  CLINICAL IMPRESSION:    Arrives today doing OK, having her planned surgery 10/1. She is schedule for therapy on Tuesday 9/30, we will plan on updating all measures at that time. Tried some gentle work on her foot/ankle but she was unable to tolerate it due to pain. Focused on lower back and upper body work today. Will plan for DC next visit, assuming she will likely need additional PT when appropriate- she is aware we will need new MD order.     OBJECTIVE IMPAIRMENTS: Abnormal gait, decreased activity tolerance, decreased balance, difficulty walking, decreased ROM, decreased strength, obesity, and pain.   ACTIVITY LIMITATIONS: squatting, stairs, and locomotion level  PARTICIPATION LIMITATIONS: shopping and community activity  PERSONAL FACTORS: Past/current experiences and 1 comorbidity: back dysfunction are also affecting patient's functional outcome.   REHAB POTENTIAL: Good  CLINICAL DECISION MAKING: Evolving/moderate complexity  EVALUATION COMPLEXITY: Moderate   GOALS:  SHORT TERM GOALS: Target date: 07/13/2024   Pt will tolerate full aquatic sessions consistently without increase in pain and with improving function to demonstrate good toleration and effectiveness of intervention.  Baseline: Goal status: Met 11/24/23  2.  Pt will navigate stair into and out of pool 6 steps using step through pattern Baseline: step-to pattern Goal status: MET   3.  Pt will report reduction in pain by 3 NPRS post aquatic session which lasts for up to 24 hours. Baseline:  Goal status: Met 11/24/23  4.  Pt will amb without antalgic gait pattern submerged in  3.6 ft Baseline:  Goal status:MET - 11/17/23  5.  Pt will demo improved quality of gait including increased toe off and stance time on L LE with reduced favoring of L LE. Goal status: ongoing 06/22/24 Target date:    6.  Pt will demo improved L ankle AROM to at least 0 deg in DF and 7 deg in Eversion for improved stiffness and gait.  Goal status: MET 6/19 Target date:  01/23/2024     LONG TERM GOALS: Target date: 08/03/2024      Pt will demonstrate an improvement in LEFS by at least 9 points for a clinically significant improvement in self perceived disability.  Baseline: 30/80 Goal status: MET  2.  Pt wiil navigate 1 flight of steps using step alternating pattern indep Baseline:  Goal status: ongoing 06/22/24  3.  Pt will demonstrate improved DF AROM to be w/n 5 deg of R ankle for improved stiffness, gait, and performance of stairs.  Baseline: see chart Goal status: MET 7/17  4.  Pt will demo left ankle strength to be at least 4/5 in DF and [facility] in PF (  tested with manual resistance in open chain) in order for improved performance of and tolerance with functional mobility Baseline: see chart Goal status:  MET  5.  Pt will amb without AD without limitation to pain Baseline:using cane or walker  Goal status: MET 7/17  6.  Pt will have improved Oswestry score to >/= 43/50 to demo MCID Baseline: Oswestry Score: 21 / 50 or 42 % Goal status: ongoing (25/50 on 6/19)  7.  Pt will be able to tolerate standing tasks for >/=15 min for household chores/tasks Baseline: 2 min Goal status: MET (max 7/17)  8.  Pt will be able to demo increased hip strength to at least 4/5 for improved standing tolerance Baseline: See MMT above Goal status: ongoing   9. Thoracic and lumbar AROM to be no more than 25% limited all planes of motion Goal status: NEW  10. Will demonstrate improved awareness of functional posture Goal status: NEW  11. Periscap muscle strength (middle trap, lower  trap, etc) to be at least 4/5 Goal status: NEW    PLAN:  PT FREQUENCY: 2x/wk  PT DURATION: 6 weeks     PLANNED INTERVENTIONS: [id number]- PT Re-evaluation, 97110-Therapeutic exercises, 97530- Therapeutic activity, 97112- Neuromuscular re-education, 97535- Self Care, [id number]- Manual therapy, [phone]- Gait training, [phone]- Orthotic Fit/training, [phone]- Aquatic Therapy, Patient/Family education, Balance training, Stair training, Taping, Dry Needling, Joint mobilization, DME instructions, Cryotherapy, and Moist heat  PLAN FOR NEXT SESSION:  DC measures/goal check, will need new MD order to continue   Pt is having surgery on 10/1 to remove the cyst.  Josette Rough, PT, DPT 07/30/24 10:56 AM

## 2024-08-03 ENCOUNTER — Other Ambulatory Visit: Payer: Self-pay | Admitting: Podiatry

## 2024-08-03 ENCOUNTER — Encounter: Payer: Self-pay | Admitting: Podiatry

## 2024-08-03 ENCOUNTER — Encounter (HOSPITAL_BASED_OUTPATIENT_CLINIC_OR_DEPARTMENT_OTHER): Payer: Self-pay | Admitting: Physical Therapy

## 2024-08-03 ENCOUNTER — Other Ambulatory Visit

## 2024-08-03 ENCOUNTER — Ambulatory Visit

## 2024-08-03 ENCOUNTER — Ambulatory Visit (HOSPITAL_BASED_OUTPATIENT_CLINIC_OR_DEPARTMENT_OTHER): Admitting: Physical Therapy

## 2024-08-03 ENCOUNTER — Ambulatory Visit: Payer: Self-pay | Admitting: Podiatry

## 2024-08-03 ENCOUNTER — Ambulatory Visit
Admission: RE | Admit: 2024-08-03 | Discharge: 2024-08-03 | Disposition: A | Discharge status: Home / Self Care | Source: Ambulatory Visit | Attending: Podiatry | Admitting: Podiatry

## 2024-08-03 DIAGNOSIS — M5459 Other low back pain: Secondary | ICD-10-CM

## 2024-08-03 DIAGNOSIS — M6281 Muscle weakness (generalized): Secondary | ICD-10-CM

## 2024-08-03 DIAGNOSIS — M25572 Pain in left ankle and joints of left foot: Secondary | ICD-10-CM

## 2024-08-03 DIAGNOSIS — M7989 Other specified soft tissue disorders: Secondary | ICD-10-CM

## 2024-08-03 DIAGNOSIS — M25672 Stiffness of left ankle, not elsewhere classified: Secondary | ICD-10-CM | POA: Diagnosis not present

## 2024-08-03 DIAGNOSIS — M546 Pain in thoracic spine: Secondary | ICD-10-CM

## 2024-08-03 DIAGNOSIS — R262 Difficulty in walking, not elsewhere classified: Secondary | ICD-10-CM

## 2024-08-03 MED ORDER — OXYCODONE-ACETAMINOPHEN 5-325 MG PO TABS: 1.0000 | ORAL_TABLET | Freq: Four times a day (QID) | ORAL | 0 refills | Status: DC | PRN

## 2024-08-03 MED ORDER — CEPHALEXIN 500 MG PO CAPS: 500.0000 mg | ORAL_CAPSULE | Freq: Three times a day (TID) | ORAL | 0 refills | Status: DC

## 2024-08-03 MED ORDER — GADOPICLENOL 0.5 MMOL/ML IV SOLN
10.0000 mL | Freq: Once | INTRAVENOUS | Status: AC | PRN
  Administered 2024-08-03: 10 mL via INTRAVENOUS

## 2024-08-03 NOTE — Therapy (Signed)
 OUTPATIENT PHYSICAL THERAPY THORACIC DISCHARGE    PHYSICAL THERAPY DISCHARGE SUMMARY  Visits from Start of Care: 58  Current functional level related to goals / functional outcomes: See below    Remaining deficits: See below    Education / Equipment: See below    Patient agrees to discharge. Patient goals were partially met. Patient is being discharged due to upcoming foot/ankle surgery on 08/04/24.    Patient Name: Haley White MRN: [id number] DOB:01-Aug-1956, 68 y.o., female Today's Date: 08/03/2024  END OF SESSION:    PT End of Session - 08/03/24 1019     Visit Number 53    Number of Visits 53    Date for Recertification  08/03/24    Authorization Type [employer] mcr    PT Start Time 0932    PT Stop Time 1011    PT Time Calculation (min) 39 min    Activity Tolerance Patient tolerated treatment well    Behavior During Therapy WFL for tasks assessed/performed                      Past Medical History:  Diagnosis Date   ADHD    Allergy     Anxiety    Arthritis    Asthma    Back pain    Cataract    Chest pain    Depression    Fibromyalgia    GERD (gastroesophageal reflux disease)    Gout    Hx pulmonary embolism    Hyperlipidemia    Hypertension    Joint pain    Obese    Osteoarthritis    Pain in left ankle    Palpitations    Pre-diabetes    Sleep apnea    SOB (shortness of breath)    Vitamin D  deficiency    Past Surgical History:  Procedure Laterality Date   ANKLE SURGERY Left    APPENDECTOMY  1984   APPENDECTOMY     CATARACT EXTRACTION W/ INTRAOCULAR LENS  IMPLANT, BILATERAL  2014   CESAREAN SECTION  1984, 1987, 1994   X3   CESAREAN SECTION     x3   COLONOSCOPY  11/16/2013   w/Brodie    EXTERNAL FIXATION LEG Left 06/04/2023   Procedure: EXTERNAL FIXATION LEG;  Surgeon: Barton Drape, MD;  Location: [facility];  Service: Orthopedics;  Laterality: Left;   EYE SURGERY Bilateral    lasix   FOOT FRACTURE SURGERY Left    FOOT  SURGERY Left 2002   FRACTURE SURGERY Left    left ankle   HARDWARE REMOVAL Left 12/03/2023   Procedure: HARDWARE REMOVAL OF SYNDEMOSIS;  Surgeon: Barton Drape, MD;  Location: [facility];  Service: Orthopedics;  Laterality: Left;   KNEE SURGERY Left 1998   ORIF ANKLE FRACTURE Left 06/04/2023   Procedure: OPEN REDUCTION INTERNAL FIXATION (ORIF) ANKLE FRACTURE vs EX FIX;  Surgeon: Barton Drape, MD;  Location: [facility];  Service: Orthopedics;  Laterality: Left;   ROTATOR CUFF REPAIR Right 2007   shoulder     torn rotator cuff   TUBAL LIGATION  1994   TUBAL LIGATION     Patient Active Problem List   Diagnosis Date Noted   Neck pain 05/31/2024   Symptomatic mammary hypertrophy 05/31/2024   Prediabetes 04/22/2024   History of thromboembolism 04/22/2024   OSA on CPAP 08/11/2023   Ankle fracture 06/03/2023   Trimalleolar fracture of left ankle 06/02/2023   Acute left ankle pain 06/02/2023   Sleep-related hypoxia  03/24/2023   At risk for obstructive sleep apnea 03/24/2023   Acute pulmonary embolism (HCC) 05/10/2022   Allergic rhinitis 03/15/2021   Gastro-esophageal reflux disease without esophagitis 03/15/2021   Mild persistent asthma, uncomplicated 03/15/2021   Aortic atherosclerosis (HCC) by Chest CT scan on 02/02/2018  01/10/2021   Asthma    Back pain 03/17/2019   Lumbosacral spondylosis without myelopathy 03/17/2019   Pseudogout 12/09/2017   Major depressive disorder, recurrent, severe without psychotic features (HCC)    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   Suicide attempt by drug ingestion (HCC)    Vitamin D  deficiency 08/22/2014   Hyperlipidemia, mixed 08/22/2014   Hypertension    Anxiety    Obesity, unspecified     PCP: Elsie Richards MD  REFERRING PROVIDER: Barton Drape, MD (for ankle) Laqueta Sharper, MD (for low back)  REFERRING DIAG: S82.852D (ICD-10-CM) - Displaced trimalleolar fracture of left lower leg, subsequent encounter for  closed fracture with routine healing   THERAPY DIAG:  Other low back pain  Pain in thoracic spine  Muscle weakness (generalized)  Pain in left ankle and joints of left foot  Difficulty in walking, not elsewhere classified  Stiffness of left ankle, not elsewhere classified  Rationale for Evaluation and Treatment: Rehabilitation  ONSET DATE: July 2024  SUBJECTIVE:   SUBJECTIVE STATEMENT:  Insurance denied the MRI to see if there's a tumor in my foot. Called my doctor to let him know. Surgery tomorrow.     PERTINENT HISTORY: The patient is s/p left ankle trimalleolar & syndesmosis ORIF, DOS: 06/05/2023.  Removal of hardware on 04/26/24.  Removal of deep hardware on 12/03/2023.   Hx of foot/ankle surgery in 2022  Fibromyalgia, HTN, PE s/p covid in 2023 LBP with MRI (10/2022) evidence of chronic mild anterior wedge compression fracture of T12 and chronic anterior wedge compression deformity of L1   PAIN:  Are you having pain? Yes:  NPRS scale: 3-4/10 R sided lumbar pain currently ; 5/10 L ankle/foot pain     PRECAUTIONS: None   WEIGHT BEARING RESTRICTIONS: WBAT  FALLS:  Has patient fallen in last 6 months? Yes. Number of falls 1 fell down wet hill walking dog  LIVING ENVIRONMENT: Lives with: lives alone Lives in: House/apartment Stairs: Yes: External: 3 steps; none Has following equipment at home: Single point cane   OCCUPATION: retired   PLOF: Independent  PATIENT GOALS: walk normally and as pain free as able  NEXT MD VISIT: June 2025  OBJECTIVE:  Note: Objective measures were completed at Evaluation unless otherwise noted.  MRI IMPRESSION: 1. No acute findings or clear explanation for the patient's symptoms. Overall findings are similar to previous CT from 2020. 2. Interval development of a healed mild superior endplate compression deformity at L1 since 2020. No acute osseous findings. 3. Multilevel spondylosis as described, most advanced at L4-5  where there is mild spinal stenosis and asymmetric lateral recess and foraminal narrowing on the left with suspected chronic left L4 nerve root encroachment. 4. Mild asymmetric lateral recess and foraminal narrowing on the left at L5-S1 without definite nerve root encroachment.   LOWER EXTREMITY ROM:   Active ROM Right eval Left eval Left  11/17/23 Right 2/28 Left 2/28 AROM/PROM Left 4/21 AROM  L 5/12  L 6/19 L 6/30 L 8/14 L 06/21/24 L 08/03/24  Hip flexion                   Hip extension  Hip abduction                   Hip adduction                   Hip internal rotation                   Hip external rotation                   Knee flexion                   Knee extension                   Ankle dorsiflexion   -8 Lacking 2 14 Lacking 7 deg / 3  with pain Lacking 1 degree/pain with AAROM  4 1 (calf tightness) 10deg 6 10* 7*  Ankle plantarflexion   30d 42 65 55 65deg 65 54 55 51 48* 55*  Ankle inversion   Limited by 50%   43 24 12 17 22 24 25  19* 17*  Ankle eversion   Limited by 50%   10 2 with pain 12 5 pain 11 15 11  12* 12*   (Blank rows = not tested)  LEFS 4/21: 14/80 LEFS: Lower Extremity Functional Score: 30 / 80 = 37.5 % ODI Score = 17 points (34%)  LEFS 6/19: 31/80 6/19 Modified Owsestry: 50% 8/14 MODI: 30% 8/`4: LEFS:24/80 08/03/24 LEFS 27/80, Modified Oswestry 28%   MMT 4+ throughout L ankle with pain into EV MMT 6/19: 5/5 DF and PF, 4+/5 In,ev.  Hip flexion 11.1lbs R, 17.7, abduction 11.1R, 19L (seated)   06/21/24  Ankle DF L 4+/5, inversion 4+/5, eversion 4+/5 Shoulder strength: flexion 4+/5 B, ABD 4+/5   08/03/24 deferred ankle MMT due to pain levels   Back/neck ROM 06/21/24  Cervical AROM: extension about 25% limited, flexion full but painful in collar bones, lateral flexion at least 50% limited B, rotation about 50% limited B  Thoracic AROM: flexion 90% limited, extension WNL but painful/some lumbar popping, lateral flexion  50% limited B left more painful, rotation 50% limited B Lumbar AROM: 50% limited, extension 90% limited painful, lateral flexion R WNL L 50% limited, rotation R 50% limited L 80% limited  Rounded shoulders, thoracic kyphosis noted    08/03/24  Cervical AROM: extension full, flexion full, lateral flexion 25% limited B, rotation WNL B Thoracic AROM: flexion 25% limited, extension WNL no pain, lateral flexion WNL B, rotation R 50% limited L 80% limited Lumbar AROM: flexion WNL, extension WNL no pain, lateral flexion WNL B, rotation maybe 25% limited B     TODAY'S TREATMENT:    08/03/24  UBE L3.5-4, 4 min forward/4 min backward  LEFS, Modified Oswestry, ROM measures, goals, related education on DC and HEP/exercise moving forward   Seated TA sets transitioning to hooklying due to L foot/ankle pain 15x3 seconds PPT hooklying 10x3 seconds  Encouraged upper body HEP as able after surgery, could also use under desk bike as arm bike to help keep UEs/upper body active     07/30/24  UBE L4 x4 minutes forward/backward for w/u   Attempted ankle PROM, unable to tolerate due to pain  UT stretch 2x30 seconds B Levator stretch 2x30 seconds B  Thoracic extensions + pec stretch x10 Seated row green TB x15, blue TB x10 Shoulder extensions green TB x15, blue TB x10 QL stretch forward/L/R x30 seconds each (2 rounds) Seated TA sets 10x3  second holds Seated TA sets + march x10     07/27/24 Manual Therapy: Reviewed pt presentation, pain level, and response to prior Rx. PT educated pt's daughter and instructed her how to performing cupping.  Pt's daughter observed cupping.  PT answered pt's daughter questions. Manual Therapy:  STM to bilat lumbar and thoracic paraspinals f/b static and dynamic cupping in prone   Pt's daughter had questions concerning stairs and PT educated with proper sequencing for stairs.  Pt performed stairs with rail.  PT also educated pt and daughter in how to use a cane  with stairs.    07/21/24 Toe scrunches 2x10 Seated heel raise 2x10  UT stretching 30sec x2ea Seated row GTB 2x15 Seated bil ER RTB 2x12 Seated horizontal abduction RTB  2x12 Seated W 2x10 Pulleys abduction for lat stretch, flexion x78min Seated lumbar flexion stretch 4x20sec  07/16/24: Pt received Gentle PROM to L ankle in DF, PF, Inv, and Eve w/n pt tolerance Attempted inv/eve on half roll though stopped due to pain Attempted Seated BAPS lvl2 DF/PF though stopped due to pain Seated heel raises 2x10 Seated row GTB 2x15 Seated bil ER RTB 2x12 Seated horizontal abduction RTB  2x10 Pec stretch with hands behind head 3x15 sec in sitting  Manual Therapy:  STM to bilat thoracic and lumbar paraspinals in prone.  Static  cupping to bilat lumbar paraspinals and gentle gliding with smaller cup to R sided lumbar paraspinals in prone.      PATIENT EDUCATION:  Education details:  exercise form, relevant anatomy, POC, and rationale of interventions. Person educated: Patient Education method: Explanation, Demonstration, Verbal cues Education comprehension: verbalized understanding, returned demonstration, verbal cues required, tactile cues required, and needs further education     HOME EXERCISE PROGRAM: Access Code: [email or web address] URL: [email or web address] Date: 06/22/24 Prepared by: Josette Rough      ASSESSMENT:  CLINICAL IMPRESSION:    Session focus on updating objective measures and goals for DC today. She has made quite good progress in terms of her lumbar, thoracic, and cervical regions as well as in general postural awareness but is still very limited in terms of her LE/foot and ankle. Having surgery tomorrow, we will DC with advanced HEP. Will await new MD referral when medical team feels she is ready for return to PT following surgical procedure.    OBJECTIVE IMPAIRMENTS: Abnormal gait, decreased activity tolerance, decreased balance, difficulty walking,  decreased ROM, decreased strength, obesity, and pain.   ACTIVITY LIMITATIONS: squatting, stairs, and locomotion level  PARTICIPATION LIMITATIONS: shopping and community activity  PERSONAL FACTORS: Past/current experiences and 1 comorbidity: back dysfunction are also affecting patient's functional outcome.   REHAB POTENTIAL: Good  CLINICAL DECISION MAKING: Evolving/moderate complexity  EVALUATION COMPLEXITY: Moderate   GOALS:  SHORT TERM GOALS: Target date: 07/13/2024   Pt will tolerate full aquatic sessions consistently without increase in pain and with improving function to demonstrate good toleration and effectiveness of intervention.  Baseline: Goal status: Met 11/24/23  2.  Pt will navigate stair into and out of pool 6 steps using step through pattern Baseline: step-to pattern Goal status: MET   3.  Pt will report reduction in pain by 3 NPRS post aquatic session which lasts for up to 24 hours. Baseline:  Goal status: Met 11/24/23  4.  Pt will amb without antalgic gait pattern submerged in 3.6 ft Baseline:  Goal status:MET - 11/17/23  5.  Pt will demo improved quality of gait including increased toe off and stance time on L  LE with reduced favoring of L LE. Goal status: ongoing 06/22/24 Target date:    6.  Pt will demo improved L ankle AROM to at least 0 deg in DF and 7 deg in Eversion for improved stiffness and gait.  Goal status: MET 6/19 Target date:  01/23/2024     LONG TERM GOALS: Target date: 08/03/2024      Pt will demonstrate an improvement in LEFS by at least 9 points for a clinically significant improvement in self perceived disability.  Baseline: 30/80 Goal status: MET  2.  Pt wiil navigate 1 flight of steps using step alternating pattern indep Baseline:  Goal status: ongoing 06/22/24  3.  Pt will demonstrate improved DF AROM to be w/n 5 deg of R ankle for improved stiffness, gait, and performance of stairs.  Baseline: see chart Goal status: MET  7/17  4.  Pt will demo left ankle strength to be at least 4/5 in DF and WFL in PF (tested with manual resistance in open chain) in order for improved performance of and tolerance with functional mobility Baseline: see chart Goal status:  MET  5.  Pt will amb without AD without limitation to pain Baseline:using cane or walker  Goal status: MET 7/17  6.  Pt will have improved Oswestry score to >/= 43/50 to demo MCID Baseline: Oswestry Score: 21 / 50 or 42 % Goal status: NOT MET 08/03/24  7.  Pt will be able to tolerate standing tasks for >/=15 min for household chores/tasks Baseline: 2 min Goal status: MET (max 7/17)  8.  Pt will be able to demo increased hip strength to at least 4/5 for improved standing tolerance Baseline: See MMT above Goal status: DNT 08/03/24  9. Thoracic and lumbar AROM to be no more than 25% limited all planes of motion Goal status: PARTIALLY MET 08/03/24  10. Will demonstrate improved awareness of functional posture Goal status: MET 08/03/24  11. Periscap muscle strength (middle trap, lower trap, etc) to be at least 4/5 Goal status: DNT  08/03/24   PLAN:  PT FREQUENCY: 2x/wk  PT DURATION: 6 weeks     PLANNED INTERVENTIONS: [id number]- PT Re-evaluation, 97110-Therapeutic exercises, 97530- Therapeutic activity, 97112- Neuromuscular re-education, 97535- Self Care, [id number]- Manual therapy, [phone]- Gait training, [phone]- Orthotic Fit/training, [phone]- Aquatic Therapy, Patient/Family education, Balance training, Stair training, Taping, Dry Needling, Joint mobilization, DME instructions, Cryotherapy, and Moist heat  PLAN FOR NEXT SESSION:  DC today   Josette Rough, PT, DPT 08/03/24 10:21 AM

## 2024-08-04 DIAGNOSIS — M25775 Osteophyte, left foot: Secondary | ICD-10-CM | POA: Diagnosis not present

## 2024-08-04 DIAGNOSIS — D2372 Other benign neoplasm of skin of left lower limb, including hip: Secondary | ICD-10-CM | POA: Diagnosis not present

## 2024-08-05 ENCOUNTER — Encounter (HOSPITAL_BASED_OUTPATIENT_CLINIC_OR_DEPARTMENT_OTHER): Admitting: Physical Therapy

## 2024-08-06 ENCOUNTER — Ambulatory Visit (HOSPITAL_COMMUNITY)
Admission: RE | Admit: 2024-08-06 | Discharge: 2024-08-06 | Disposition: A | Discharge status: Home / Self Care | Source: Ambulatory Visit | Attending: Physician Assistant | Admitting: Physician Assistant

## 2024-08-06 DIAGNOSIS — R072 Precordial pain: Secondary | ICD-10-CM | POA: Diagnosis present

## 2024-08-06 DIAGNOSIS — R0602 Shortness of breath: Secondary | ICD-10-CM | POA: Diagnosis present

## 2024-08-06 LAB — ECHOCARDIOGRAM COMPLETE
Area-P 1/2: 3.2 cm2
S' Lateral: 2.53 cm

## 2024-08-06 MED ORDER — PERFLUTREN LIPID MICROSPHERE
1.0000 mL | INTRAVENOUS | Status: AC | PRN
  Administered 2024-08-06: 2 mL via INTRAVENOUS

## 2024-08-09 ENCOUNTER — Ambulatory Visit: Admitting: Podiatry

## 2024-08-09 ENCOUNTER — Other Ambulatory Visit: Payer: Self-pay | Admitting: Podiatry

## 2024-08-09 ENCOUNTER — Other Ambulatory Visit (INDEPENDENT_AMBULATORY_CARE_PROVIDER_SITE_OTHER): Payer: Self-pay | Admitting: Internal Medicine

## 2024-08-09 ENCOUNTER — Ambulatory Visit (INDEPENDENT_AMBULATORY_CARE_PROVIDER_SITE_OTHER): Admitting: Podiatry

## 2024-08-09 ENCOUNTER — Ambulatory Visit (INDEPENDENT_AMBULATORY_CARE_PROVIDER_SITE_OTHER): Admitting: Internal Medicine

## 2024-08-09 ENCOUNTER — Encounter (INDEPENDENT_AMBULATORY_CARE_PROVIDER_SITE_OTHER): Payer: Self-pay | Admitting: Internal Medicine

## 2024-08-09 VITALS — BP 139/86 | HR 86 | Temp 98.3°F | Ht 62.0 in

## 2024-08-09 VITALS — BP 143/82 | HR 75 | Temp 98.4°F | Resp 18

## 2024-08-09 DIAGNOSIS — Z9189 Other specified personal risk factors, not elsewhere classified: Secondary | ICD-10-CM

## 2024-08-09 DIAGNOSIS — R7303 Prediabetes: Secondary | ICD-10-CM | POA: Diagnosis not present

## 2024-08-09 DIAGNOSIS — E559 Vitamin D deficiency, unspecified: Secondary | ICD-10-CM | POA: Diagnosis not present

## 2024-08-09 DIAGNOSIS — M67472 Ganglion, left ankle and foot: Secondary | ICD-10-CM

## 2024-08-09 DIAGNOSIS — I10 Essential (primary) hypertension: Secondary | ICD-10-CM | POA: Diagnosis not present

## 2024-08-09 DIAGNOSIS — Z6841 Body Mass Index (BMI) 40.0 and over, adult: Secondary | ICD-10-CM

## 2024-08-09 DIAGNOSIS — E66813 Obesity, class 3: Secondary | ICD-10-CM

## 2024-08-09 DIAGNOSIS — D126 Benign neoplasm of colon, unspecified: Secondary | ICD-10-CM | POA: Insufficient documentation

## 2024-08-09 DIAGNOSIS — E782 Mixed hyperlipidemia: Secondary | ICD-10-CM

## 2024-08-09 DIAGNOSIS — Z9889 Other specified postprocedural states: Secondary | ICD-10-CM

## 2024-08-09 MED ORDER — OXYCODONE-ACETAMINOPHEN 5-325 MG PO TABS: 1.0000 | ORAL_TABLET | Freq: Four times a day (QID) | ORAL | 0 refills | Status: DC | PRN

## 2024-08-09 MED ORDER — METFORMIN HCL ER 500 MG PO TB24: 500.0000 mg | ORAL_TABLET | Freq: Every day | ORAL | 0 refills | Status: DC

## 2024-08-09 NOTE — Progress Notes (Signed)
 Office: [phone]  /  Fax: [phone]  Weight Summary and Body Composition Analysis (BIA)  Vitals Temp: 98.3 F (36.8 C) BP: 139/86 Pulse Rate: 86 SpO2: 96 %   Anthropometric Measurements Height: 5' 2 (1.575 m) Weight: -- (Pt  - surgery L ft and wearing a boot, can not get on scale) Weight at Last Visit: 235 lb Starting Weight: 235 lb Peak Weight: 245 lb   No data recorded  RMR: 1598  Today's Visit #: 2  Starting Date: 07/26/24   Subjective   Chief Complaint: Obesity  Interval History Discussed the use of AI scribe software for clinical note transcription with the patient, who gave verbal consent to proceed.  History of Present Illness Haley White is a 68 year old female who presents for follow-up after foot surgery and weight management consultation. She is accompanied by her daughter.  She is following a 1200-calorie nutrition plan 95% of the time.  She has been tracking and journaling meticulously has been eating more whole foods getting the recommended amount of protein occasionally skips meals she reports adequate sleep she has not been exercising due to foot surgery.  She underwent foot surgery last week, which was successful and without complications. She is currently in a boot. She has not used trazodone  recently due to pain medication use post-surgery.  She is actively focusing on weight management by increasing her protein intake and utilizing prepackaged meals from a service called '[employer]'. She has experienced a decrease in appetite, often not feeling hungry for breakfast. Her clothes are fitting looser, and she has lost weight, confirmed by her home scale. Prior to her surgery, she had lost 17 pounds, attributed to dietary changes and increased water intake.  Recent blood work from September showed normal kidney function, electrolytes, liver enzymes, and cholesterol levels. Her GFR was 67. Vitamin D  levels were high, and she has been taking  10,000 IU daily. Her A1c decreased from 6.2 to 5.9, indicating improved blood sugar control. She has a history of anemia, but recent iron studies were normal.  She is currently taking fluoxetine , bupropion , and metformin for weight management. She also takes Ritalin  for focus and uses a CPAP machine for sleep apnea. She has a history of smoking from age 31 to 61 but has not smoked since. She is up to date with mammograms but is due for a colonoscopy, having had polyps in the past. She is interested in resuming physical activities such as walking and swimming once her foot heals.  No changes in sleep or energy levels, attributing low energy to pain medications. No recent snacking urges and notes a decrease in appetite.    The 10-year ASCVD risk score (Arnett DK, et al., 2019) is: 8.8%  Challenges affecting patient progress: Inability to exercise due to recent foot surgery.    Pharmacotherapy for weight management: She is currently taking no anti-obesity medication.   Assessment and Plan   Treatment Plan For Obesity:  Recommended Dietary Goals  [name] is currently in the action stage of change. As such, her goal is to continue weight management plan. She has agreed to: continue current plan  Behavioral Health and Counseling  We discussed the following behavioral modification strategies today: continue to work on maintaining a reduced calorie state, getting the recommended amount of protein, incorporating whole foods, making healthy choices, staying well hydrated and practicing mindfulness when eating. and increase protein intake, fibrous foods (25 grams per day for women, 30 grams for men)  and water to improve satiety and decrease hunger signals. .  Additional education and resources provided today: Handout on increasing daily activity and exercise goal setting  Recommended Physical Activity Goals  Monnie has been advised to work up to 150 minutes of moderate intensity aerobic activity a  week and strengthening exercises 2-3 times per week for cardiovascular health, weight loss maintenance and preservation of muscle mass.  She has agreed to :  Think about enjoyable ways to increase daily physical activity and overcoming barriers to exercise, Increase physical activity in their day and reduce sedentary time (increase NEAT)., Increase volume of physical activity to a goal of 240 minutes a week, and Combine aerobic and strengthening exercises for efficiency and improved cardiometabolic health.  Medical Interventions and Pharmacotherapy  We discussed various medication options to help [facility] with her weight loss efforts and we both agreed to : Start metformin XR 500 mg once a day for diabetes prevention and weight management  Associated Conditions Impacted by Obesity Treatment  Assessment & Plan Prediabetes Most recent A1c is  Lab Results  Component Value Date   HGBA1C 5.9 (H) 07/26/2024   HGBA1C 5.9 (H) 03/30/2015    Patient aware of disease state and risk of progression. This may contribute to abnormal cravings, fatigue and diabetic complications without having diabetes.   We have discussed treatment options which include: losing 7 to 10% of body weight, increasing physical activity to a goal of 150 minutes a week at moderate intensity.  Advised to maintain a diet low on simple and processed carbohydrates. After discussion of benefits and side effect she will be started on metformin XR 500 mg once a day  Vitamin D  deficiency High normal vitamin D  levels with history of vitamin D  deficiency.  Patient advised to reduce vitamin D  supplementation to 10,000 units 3 times a week versus daily. At increased risk for cardiovascular disease Her cardiovascular risk is estimated to be 8.8%.  She has a favorable lipid profile and is on high intensity statin therapy which she should continue.  Her blood pressure is elevated today she will continue to work on monitoring weight loss.    Class 3 severe obesity with serious comorbidity and body mass index (BMI) of 40.0 to 44.9 in adult, unspecified obesity type (HCC) Because of recent foot surgery she was unable to get on her scale she does feel her close fitting looser and is noticing weight loss.  She reports adequate satiation and improve appetite control on current nutritional plan.  Denies any barriers.  She is prediabetic.  She was counseled on the carb insulin  model for obesity.  She will be started on metformin XR for weight management and diabetes prevention.  She was also counseled on goals for physical activity and [facility] provided a handout.  She will resume physical activity once she is cleared from the surgical standpoint. Primary hypertension Her blood pressure is above goal.  She is on several medications known to elevate blood pressure these include Ritalin , Wellbutrin  and trazodone .  She is currently on lisinopril  for blood pressure.  Patient counseled on increased cardiovascular risk at 8.8% she will continue to work with her primary care team for blood pressure goal of less than 130/80.  Losing 10% of body weight may improve blood pressure control.  Continue current weight management strategy Colonic adenoma I reviewed cancer risk she is up-to-date on mammogram denies previous history of abnormal Paps.  She does report having a colonoscopy in 2021 that showed multiple polyps based  on outgoing message to the letter possible adenomas I cannot find pathology in the system.  She was reminded to schedule her postpolypectomy surveillance and will be doing so once she recovers from her foot surgery.  She is a year or 2 overdue. Hyperlipidemia, mixed I reviewed most recent lipid profile her LDL cholesterol is in the 29d she has normal triglycerides and her HDL is 88 she is currently on high intensity statin therapy without any adverse effects.  Has been counseled on reducing saturated fats in diet.  She will continue statin  therapy         Objective   Physical Exam:  Blood pressure 139/86, pulse 86, temperature 98.3 F (36.8 C), height 5' 2 (1.575 m), last menstrual period 11/26/2011, SpO2 96%. Body mass index is 42.91 kg/m.  General: She is overweight, cooperative, alert, well developed, and in no acute distress. PSYCH: Has normal mood, affect and thought process.   HEENT: EOMI, sclerae are anicteric. Lungs: Normal breathing effort, no conversational dyspnea. Extremities: No edema.  Neurologic: No gross sensory or motor deficits. No tremors or fasciculations noted.    Diagnostic Data Reviewed:  BMET    Component Value Date/Time   NA 145 (H) 07/26/2024 1022   K 4.1 07/26/2024 1022   CL 105 07/26/2024 1022   CO2 25 07/26/2024 1022   GLUCOSE 86 07/26/2024 1022   GLUCOSE 121 (H) 06/29/2024 1737   BUN 15 07/26/2024 1022   CREATININE 0.94 07/26/2024 1022   CREATININE 0.82 10/24/2023 1124   CALCIUM  9.1 07/26/2024 1022   GFRNONAA >60 06/29/2024 1707   GFRNONAA 56 (L) 01/10/2021 0852   GFRAA 65 01/10/2021 0852   Lab Results  Component Value Date   HGBA1C 5.9 (H) 07/26/2024   HGBA1C 5.9 (H) 03/30/2015   Lab Results  Component Value Date   INSULIN  16.6 07/26/2024   Lab Results  Component Value Date   TSH 0.95 01/22/2023   CBC    Component Value Date/Time   WBC 7.8 06/29/2024 1707   RBC 3.87 06/29/2024 1707   HGB 11.9 (L) 06/29/2024 1737   HCT 35.0 (L) 06/29/2024 1737   PLT 254 06/29/2024 1707   MCV 94.1 06/29/2024 1707   MCV 97.5 (A) 11/28/2013 1334   MCH 31.0 06/29/2024 1707   MCHC 33.0 06/29/2024 1707   RDW 12.0 06/29/2024 1707   Iron Studies    Component Value Date/Time   IRON 88 07/26/2024 1022   TIBC 268 07/26/2024 1022   FERRITIN 172 (H) 07/26/2024 1022   IRONPCTSAT 33 07/26/2024 1022   IRONPCTSAT 38 01/10/2021 0852   Lipid Panel     Component Value Date/Time   CHOL 175 07/26/2024 1022   TRIG 89 07/26/2024 1022   HDL 88 07/26/2024 1022   CHOLHDL 2.2  10/24/2023 1124   VLDL 21 03/30/2015 0926   LDLCALC 71 07/26/2024 1022   LDLCALC 92 10/24/2023 1124   Hepatic Function Panel     Component Value Date/Time   PROT 6.2 07/26/2024 1022   ALBUMIN 4.4 07/26/2024 1022   AST 16 07/26/2024 1022   ALT 19 07/26/2024 1022   ALKPHOS 77 07/26/2024 1022   BILITOT 0.7 07/26/2024 1022   BILIDIR 0.2 10/08/2017 1144   IBILI 0.8 10/08/2017 1144      Component Value Date/Time   TSH 0.95 01/22/2023 1152   Nutritional Lab Results  Component Value Date   VD25OH 68.1 07/26/2024   VD25OH 80.96 06/02/2023   VD25OH 91 01/22/2023    Medications:  Outpatient Encounter Medications as of 08/09/2024  Medication Sig   metFORMIN (GLUCOPHAGE-XR) 500 MG 24 hr tablet Take 1 tablet (500 mg total) by mouth daily with breakfast.   albuterol  (VENTOLIN  HFA) 108 (90 Base) MCG/ACT inhaler Inhale 2 puffs into the lungs every 6 (six) hours as needed for wheezing or shortness of breath.   aspirin  EC 81 MG tablet Take 1 tablet (81 mg total) by mouth daily. Swallow whole.   azelastine  (ASTELIN ) 0.1 % nasal spray Place 1 spray into both nostrils 2 (two) times daily. Use in each nostril as directed   budesonide  (PULMICORT ) 0.5 MG/2ML nebulizer solution Take 2 mLs (0.5 mg total) by nebulization daily as needed.   buPROPion  (WELLBUTRIN  XL) 300 MG 24 hr tablet Take 300 mg by mouth daily.   cephALEXin  (KEFLEX ) 500 MG capsule Take 1 capsule (500 mg total) by mouth 3 (three) times daily.   cetirizine  (ZYRTEC  ALLERGY ) 10 MG tablet Take 1 tablet (10 mg total) by mouth daily.   EPINEPHrine  0.3 mg/0.3 mL IJ SOAJ injection See admin instructions.   FLUoxetine  (PROZAC ) 20 MG capsule Take 60 mg by mouth daily.   fluticasone  (FLONASE) 50 MCG/ACT nasal spray Place 1 spray into both nostrils daily as needed for allergies or rhinitis.   lisinopril  (ZESTRIL ) 20 MG tablet Take 2 tablets (40 mg total) by mouth daily.   methylphenidate  (RITALIN ) 10 MG tablet Take 10 mg by mouth 2 (two) times  daily.   montelukast  (SINGULAIR ) 10 MG tablet Take 10 mg by mouth at bedtime.   NON FORMULARY Inject 1 Dose into the skin once a week. Allergy  shots from [facility]  at [place]. Patient not sure about the name of medicine   oxyCODONE -acetaminophen  (PERCOCET/ROXICET) 5-325 MG tablet Take 1-2 tablets by mouth every 6 (six) hours as needed for severe pain (pain score 7-10).   pantoprazole  (PROTONIX ) 40 MG tablet TAKE 1 TABLET BY MOUTH DAILY TO PREVENT INDIGESTION AND HEARTBURN   rosuvastatin  (CRESTOR ) 40 MG tablet Take  1 tablet Daily for Cholesterol            \    TAKE  BY MOUTH   SYMBICORT 160-4.5 MCG/ACT inhaler Inhale 2 puffs into the lungs 2 (two) times daily.   traZODone  (DESYREL ) 50 MG tablet Take 1 tablet (50 mg total) by mouth at bedtime.   VITAMIN D  PO Take 10,000 Units by mouth daily.   Facility-Administered Encounter Medications as of 08/09/2024  Medication   ipratropium-albuterol  (DUONEB) 0.5-2.5 (3) MG/3ML nebulizer solution 3 mL     Follow-Up   No follow-ups on file.SABRA She was informed of the importance of frequent follow up visits to maximize her success with intensive lifestyle modifications for her multiple health conditions.  Attestation Statement   Reviewed by clinician on day of visit: allergies, medications, problem list, medical history, surgical history, family history, social history, and previous encounter notes.     Lucas Parker, MD

## 2024-08-09 NOTE — Assessment & Plan Note (Signed)
 I reviewed cancer risk she is up-to-date on mammogram denies previous history of abnormal Paps.  She does report having a colonoscopy in 2021 that showed multiple polyps based on outgoing message to the letter possible adenomas I cannot find pathology in the system.  She was reminded to schedule her postpolypectomy surveillance and will be doing so once she recovers from her foot surgery.  She is a year or 2 overdue.

## 2024-08-09 NOTE — Assessment & Plan Note (Signed)
 Because of recent foot surgery she was unable to get on her scale she does feel her close fitting looser and is noticing weight loss.  She reports adequate satiation and improve appetite control on current nutritional plan.  Denies any barriers.  She is prediabetic.  She was counseled on the carb insulin  model for obesity.  She will be started on metformin XR for weight management and diabetes prevention.  She was also counseled on goals for physical activity and [employer] provided a handout.  She will resume physical activity once she is cleared from the surgical standpoint.

## 2024-08-09 NOTE — Patient Instructions (Signed)
 Wound Care Keep the surgical site clean and dry. Change dressings as instructed by your surgeon. Avoid soaking the foot until wound is fully healed unless instructed otherwise by your surgeon. Watch for signs of infection: increased redness, swelling, warmth, discharge or foul odor. Contact our office immediately if you notice signs of infection or if you develop a fever. Temp over 100.4.  2.   Pain Management Take prescribed pain medications as directed by surgeon. Over-the-counter pain relievers (e.g. acetaminophen  or ibuprofen ) may be used if you are not allergic, are able to take them, and are approved by your surgeon.  Elevate your foot above heart level to help reduce swelling and pain. Apply ice packs for 15-20 minutes every 2-3 hours during the first 48 hours and as needed. AVOID DIRECT CONTACT WITH SKIN 3.    Activity and Mobility Follow weight-bearing restrictions carefully (non-weight bearing, partial weight-bearing or full weight-bearing) as instructed.  Use crutches, walker or other assistive devices as recommended. Avoid strenuous activities until cleared by your surgeon.  Gradually increase your activity level as advised.  4.    Footwear Wear any special post-operative shoe or boot that has been provided or recommended.  Avoid tight or ill-fitting shoes until the foot is fully healed. Do not walk barefoot. 5.    Follow-up Appointments Attend all scheduled follow-ups for wound checks, suture/staple removals and progress evaluation.  Notify your surgeon if you experience persistent pain, numbness or unusual symptoms.  6.    Signs to Watch For (When to Call Your Doctor) Severe pain not relieved by pain medication. Excessive swelling or bleeding. Signs of infection. Numbness or tingling. Difficulty breathing or chest pain (Call 911 or seek emergency medical care)   From today's visit: Instructed that if not taking Percocet, okay to take Tylenol  alone. Educated on maximum  dose of 3000 mg acetaminophen  daily. Leave dressing in place until next week, ice and elevate. Instructed okay to wash with soap and water, not to soak area, starting next week. Dress with gauze and ace wrap. Continue wearing boot.

## 2024-08-09 NOTE — Progress Notes (Unsigned)
 Haley White  Date of Surgery: 08/04/2024 Surgery Performed: LEFT FOOT EXCISION OF SOFT TISSUE MASS  Surgeon: Dr. Gershon   Subjective: -Patient Complaints: kbpostopptcomplaints: throbbing -Pain Level: 5/10 -Pain Description: intermittent and sharp -DVT Questions: Lower extremity swelling -Medication Compliance: [x] antibiotics  [x] pain medication    -Wound Care adherence: [x] yes     [] no -Weight Bearing adherence:  [x] yes     [] no -New Symptoms: complains of calf swelling that comes and goes with extended weight bearing. First 48 hours post surgery had severe sharp pains in the left 1st toe.    Objective: -Vital Signs:  Vitals:   08/09/24 1555  BP: (!) 155/85  Pulse: 80  Resp: 18  Temp: 98.4 F (36.9 C)  SpO2: 96%   BP rechecked: 143/82  -Radiographs: [] Obtained    [x] Not Obtained -Inspection of surgical site:   -Dressing: kbsurgdressing: clean, dry, intact, and left in place since surgery  -Cast/Splint/Pins: [] Removed    [] Left in place    [] Replaced      [x] Not Applicable  -Signs of infection: warmth  -Drainage: scant, serosanguinous  -Surgical Incision/Wound Closure: Sutures left in place.    -Skin Appearance: kbpostopskin: bruising and tender to touch incisional erythema  -Ambulatory Status: ambulatory, Full Weight-Bearing, and Using DME/Assistive Device rolator walker and cam boot -DVT Assessment: Negative -Neurovascular Status:  -Pulses: present   -Capillary Refill: [x] <3 seconds     [] >3 seconds  -Sensation: [x] Intact     [] Numbness/Tingling     [] Decreased    Assessment: -Radiographs reviewed by provider if applicable.  -Signs of infection: [] Present     [x] Absent -Wound Healing: [x] Within expected parameters     [] Delayed healing -Pain management: [x] Adequate     [] Needs adjustment -DVT Findings: [x] Negative findings     [] Positive Findings    Plan: -Wound care instructions reinforced: [x] Yes -Dressing Changed: [x] Yes     [] No  -Dressing used: xeroform,  gauze, kerlix, ace wrap, stockinet, cam boot.  -Sutures/Staples care: left in place -DVT Plan: [x] Provider informed    [] Protocol activated -Patient education provided on:  -Continue DVT prophylaxis as previously discussed (if appropriate)  -Signs of infection  -Activity restrictions  -Weight bearing status  -Dressing change   -Wound Care   -Follow up appointment: 2 weeks   -Dr. Gershon evaluated patient and instructed patient that he will refill Percocet. Instructed that if not taking Percocet, okay to take Tylenol  alone. Educated on maximum dose of 3000 mg acetaminophen  daily. Leave dressing in place until next week, ice and elevate. Instructed okay to wash with soap and water, not to soak area, starting next week. Dress with gauze and ace wrap. Continue wearing boot.

## 2024-08-09 NOTE — Assessment & Plan Note (Signed)
 Her cardiovascular risk is estimated to be 8.8%.  She has a favorable lipid profile and is on high intensity statin therapy which she should continue.  Her blood pressure is elevated today she will continue to work on monitoring weight loss.

## 2024-08-09 NOTE — Assessment & Plan Note (Signed)
 High normal vitamin D  levels with history of vitamin D  deficiency.  Patient advised to reduce vitamin D  supplementation to 10,000 units 3 times a week versus daily.

## 2024-08-09 NOTE — Assessment & Plan Note (Signed)
 Her blood pressure is above goal.  She is on several medications known to elevate blood pressure these include Ritalin , Wellbutrin  and trazodone .  She is currently on lisinopril  for blood pressure.  Patient counseled on increased cardiovascular risk at 8.8% she will continue to work with her primary care team for blood pressure goal of less than 130/80.  Losing 10% of body weight may improve blood pressure control.  Continue current weight management strategy

## 2024-08-09 NOTE — Telephone Encounter (Signed)
   Name: Haley White  DOB: 10/10/1956  MRN: [id number]  Primary Cardiologist: Vina Gull, MD  Chart reviewed as part of pre-operative protocol coverage. Because of Haley White's past medical history and time since last visit, she will require a follow-up in-office visit in order to better assess preoperative cardiovascular risk.  Patient has an office visit scheduled on 08/18/2024 with Angie Duke, PA-C. Appointment notes have been updated to reflect need for pre-op evaluation.   Pre-op covering staff:  - Please contact requesting surgeon's office via preferred method (i.e, phone, fax) to inform them of need for appointment prior to surgery.   Barnie Hila, NP  08/09/2024, 9:22 AM

## 2024-08-09 NOTE — Assessment & Plan Note (Signed)
 Most recent A1c is  Lab Results  Component Value Date   HGBA1C 5.9 (H) 07/26/2024   HGBA1C 5.9 (H) 03/30/2015    Patient aware of disease state and risk of progression. This may contribute to abnormal cravings, fatigue and diabetic complications without having diabetes.   We have discussed treatment options which include: losing 7 to 10% of body weight, increasing physical activity to a goal of 150 minutes a week at moderate intensity.  Advised to maintain a diet low on simple and processed carbohydrates. After discussion of benefits and side effect she will be started on metformin XR 500 mg once a day

## 2024-08-09 NOTE — Assessment & Plan Note (Signed)
 I reviewed most recent lipid profile her LDL cholesterol is in the 29d she has normal triglycerides and her HDL is 88 she is currently on high intensity statin therapy without any adverse effects.  Has been counseled on reducing saturated fats in diet.  She will continue statin therapy

## 2024-08-09 NOTE — Progress Notes (Unsigned)
 SABRA

## 2024-08-10 ENCOUNTER — Encounter: Payer: Self-pay | Admitting: Adult Health

## 2024-08-10 ENCOUNTER — Ambulatory Visit: Admitting: Adult Health

## 2024-08-10 VITALS — BP 143/87 | HR 87 | Ht 63.0 in | Wt 232.0 lb

## 2024-08-10 DIAGNOSIS — G4733 Obstructive sleep apnea (adult) (pediatric): Secondary | ICD-10-CM

## 2024-08-10 MED ORDER — TRAZODONE HCL 100 MG PO TABS: 100.0000 mg | ORAL_TABLET | Freq: Every day | ORAL | 11 refills | Status: AC

## 2024-08-11 NOTE — Progress Notes (Signed)
 Community message has been sent to [employer] for pressure and supplies on 08/11/24. DD

## 2024-08-16 ENCOUNTER — Ambulatory Visit: Payer: Medicare HMO | Admitting: Adult Health

## 2024-08-17 NOTE — Progress Notes (Unsigned)
 Cardiology Office Note:    Date:  08/18/2024   ID:  VIANA SLEEP, DOB [birth year], MRN [id number]  PCP:  Laurice President, NP   [facility] Providers Cardiologist:  Haley Gull, MD Cardiology APP:  Haley Haley Garre, PA     Referring MD: Laurice President, NP   Chief Complaint  Patient presents with   Follow-up    CCTA    History of Present Illness:    Haley White is a 68 y.o. female with a hx of HTN, CAD with moderate TPV volume on CT coronary, PE no longer on OAC, depression, anxiety, and asthma.   She was evaluated in the ER 06/30/24 for chest pain and ruled out with negative CE and nonischemic EKG. CP occurs with coughing without radiation or associated symptoms. Given hx of PE, CTA was obtained and was negative for PE. Doppler study at orthopedic office earlier that day were negative for DVT.    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 She completed 6 months of eliquis  treatment. PE considered provoked.     She has had several orthopedic issues requiring surgical repair of ankle fracture. Surgery was 05/2023. She is still going to PT. She walks with a cane. She is unable to complete 4.0 METS.    She was referred to cardiology for evaluation of chest pain. CT coronary on 9//2025 showed a coronary calcium  score of 108 placing her at the 79th percentile with a total plaque volume 167 which is 47th percentile-noted as TPV moderate.   She presents back for follow up. She has lost 15 lbs with [facility] over the last 2 months. No further chest pain. She is in a left foot boot after foot surgery. BP elevated because she is in pain.    Past Medical History:  Diagnosis Date   ADHD    Allergy     Anxiety    Arthritis    Asthma    Back pain    Cataract    Chest pain    Depression    Fibromyalgia    GERD (gastroesophageal reflux disease)    Gout    Hx pulmonary embolism    Hyperlipidemia    Hypertension    Joint pain    Obese     Osteoarthritis    Pain in left ankle    Palpitations    Pre-diabetes    Sleep apnea    SOB (shortness of breath)    Vitamin D  deficiency     Past Surgical History:  Procedure Laterality Date   ANKLE SURGERY Left    APPENDECTOMY  1984   APPENDECTOMY     CATARACT EXTRACTION W/ INTRAOCULAR LENS  IMPLANT, BILATERAL  2014   CESAREAN SECTION  1984, 1987, 1994   X3   CESAREAN SECTION     x3   COLONOSCOPY  11/16/2013   w/Brodie    EXTERNAL FIXATION LEG Left 06/04/2023   Procedure: EXTERNAL FIXATION LEG;  Surgeon: Barton Drape, MD;  Location: [facility];  Service: Orthopedics;  Laterality: Left;   EYE SURGERY Bilateral    lasix   FOOT FRACTURE SURGERY Left    FOOT SURGERY Left 2002   FRACTURE SURGERY Left    left ankle   HARDWARE REMOVAL Left 12/03/2023   Procedure: HARDWARE REMOVAL OF SYNDEMOSIS;  Surgeon: Barton Drape, MD;  Location: [facility];  Service: Orthopedics;  Laterality: Left;   KNEE SURGERY Left 1998   ORIF ANKLE FRACTURE Left  06/04/2023   Procedure: OPEN REDUCTION INTERNAL FIXATION (ORIF) ANKLE FRACTURE vs EX FIX;  Surgeon: Barton Drape, MD;  Location: [facility];  Service: Orthopedics;  Laterality: Left;   ROTATOR CUFF REPAIR Right 2007   shoulder     torn rotator cuff   TUBAL LIGATION  1994   TUBAL LIGATION      Current Medications: Current Meds  Medication Sig   albuterol  (VENTOLIN  HFA) 108 (90 Base) MCG/ACT inhaler Inhale 2 puffs into the lungs every 6 (six) hours as needed for wheezing or shortness of breath.   amLODipine (NORVASC) 5 MG tablet Take 1 tablet (5 mg total) by mouth daily.   aspirin  EC 81 MG tablet Take 1 tablet (81 mg total) by mouth daily. Swallow whole.   azelastine  (ASTELIN ) 0.1 % nasal spray Place 1 spray into both nostrils 2 (two) times daily. Use in each nostril as directed   budesonide  (PULMICORT ) 0.5 MG/2ML nebulizer solution Take 2 mLs (0.5 mg total) by nebulization daily as needed.   buPROPion  (WELLBUTRIN  XL) 300  MG 24 hr tablet Take 300 mg by mouth daily.   cephALEXin  (KEFLEX ) 500 MG capsule Take 1 capsule (500 mg total) by mouth 3 (three) times daily.   cetirizine  (ZYRTEC  ALLERGY ) 10 MG tablet Take 1 tablet (10 mg total) by mouth daily.   EPINEPHrine  0.3 mg/0.3 mL IJ SOAJ injection See admin instructions.   FLUoxetine  (PROZAC ) 20 MG capsule Take 60 mg by mouth daily.   fluticasone  (FLONASE) 50 MCG/ACT nasal spray Place 1 spray into both nostrils daily as needed for allergies or rhinitis.   lisinopril  (ZESTRIL ) 20 MG tablet Take 20 mg by mouth daily.   metFORMIN (GLUCOPHAGE-XR) 500 MG 24 hr tablet Take 1 tablet (500 mg total) by mouth daily with breakfast.   methylphenidate  (RITALIN ) 10 MG tablet Take 10 mg by mouth 2 (two) times daily.   montelukast  (SINGULAIR ) 10 MG tablet Take 10 mg by mouth at bedtime.   NON FORMULARY Inject 1 Dose into the skin once a week. Allergy  shots from [facility]  at [place]. Patient not sure about the name of medicine   oxyCODONE -acetaminophen  (PERCOCET/ROXICET) 5-325 MG tablet Take 1-2 tablets by mouth every 6 (six) hours as needed for severe pain (pain score 7-10).   pantoprazole  (PROTONIX ) 40 MG tablet TAKE 1 TABLET BY MOUTH DAILY TO PREVENT INDIGESTION AND HEARTBURN   rosuvastatin  (CRESTOR ) 40 MG tablet Take  1 tablet Daily for Cholesterol            \    TAKE  BY MOUTH   SYMBICORT 160-4.5 MCG/ACT inhaler Inhale 2 puffs into the lungs 2 (two) times daily.   traZODone  (DESYREL ) 100 MG tablet Take 1 tablet (100 mg total) by mouth at bedtime.   VITAMIN D  PO Take 10,000 Units by mouth daily.   [DISCONTINUED] lisinopril  (ZESTRIL ) 20 MG tablet Take 2 tablets (40 mg total) by mouth daily.   Current Facility-Administered Medications for the 08/18/24 encounter (Office Visit) with Haley Haley Garre, PA  Medication   ipratropium-albuterol  (DUONEB) 0.5-2.5 (3) MG/3ML nebulizer solution 3 mL     Allergies:   Molds & smuts, Biaxin [clarithromycin], Biaxin  [clarithromycin], Prednisone , Prednisone , Serevent [salmeterol], Tequin [gatifloxacin], Tequin [gatifloxacin], and Serevent [salmeterol]   Social History   Socioeconomic History   Marital status: Divorced    Spouse name: Not on file   Number of children: Not on file   Years of education: Not on file   Highest education level: Not on file  Occupational History   Occupation: Retired Barrister's clerk  Tobacco Use   Smoking status: Former    Types: Cigarettes    Passive exposure: Never   Smokeless tobacco: Never  Vaping Use   Vaping status: Never Used  Substance and Sexual Activity   Alcohol use: Yes    Comment: occasionally   Drug use: Never    Comment: 06/03/2022-gummies   Sexual activity: Yes    Birth control/protection: Post-menopausal, Surgical  Other Topics Concern   Not on file  Social History Narrative   ** Merged History Encounter **       Social Drivers of Health   Financial Resource Strain: Not on file  Food Insecurity: Patient Declined (06/03/2023)   Hunger Vital Sign    Worried About Running Out of Food in the Last Year: Patient declined    Ran Out of Food in the Last Year: Patient declined  Transportation Needs: No Transportation Needs (06/02/2023)   PRAPARE - Administrator, Civil Service (Medical): No    Lack of Transportation (Non-Medical): No  Physical Activity: Not on file  Stress: Not on file  Social Connections: Not on file     Family History: The patient's family history includes Anxiety disorder in her father and sister; Deep vein thrombosis in her father; Dementia in her father; Depression in her father; Heart disease in her father; Hyperlipidemia in her father; Hypertension in her father. There is no history of Colon cancer, Colon polyps, Esophageal cancer, Stomach cancer, or Rectal cancer.  ROS:   Please see the history of present illness.     All other systems reviewed and are negative.  EKGs/Labs/Other Studies Reviewed:    The  following studies were reviewed today:  EKG Interpretation Date/Time:  Wednesday August 18 2024 11:30:14 EDT Ventricular Rate:  71 PR Interval:  168 QRS Duration:  86 QT Interval:  396 QTC Calculation: 430 R Axis:   60  Text Interpretation: Normal sinus rhythm Low voltage QRS When compared with ECG of 30-Jun-2024 02:26, PREVIOUS ECG IS PRESENT Confirmed by Haley Slough ([id number]) on 08/18/2024 12:00:08 PM    Recent Labs: 06/29/2024: B Natriuretic Peptide 115.2; Hemoglobin 11.9; Platelets 254 07/26/2024: ALT 19; BUN 15; Creatinine, Ser 0.94; Potassium 4.1; Sodium 145  Recent Lipid Panel    Component Value Date/Time   CHOL 175 07/26/2024 1022   TRIG 89 07/26/2024 1022   HDL 88 07/26/2024 1022   CHOLHDL 2.2 10/24/2023 1124   VLDL 21 03/30/2015 0926   LDLCALC 71 07/26/2024 1022   LDLCALC 92 10/24/2023 1124     Risk Assessment/Calculations:             Physical Exam:    VS:  BP (!) 146/87   Pulse 71   Ht 5' 3 (1.6 m)   Wt 237 lb (107.5 kg)   LMP 11/26/2011   SpO2 97%   BMI 41.98 kg/m     Wt Readings from Last 3 Encounters:  08/18/24 237 lb (107.5 kg)  08/10/24 232 lb (105.2 kg)  07/28/24 234 lb 9.6 oz (106.4 kg)     GEN:  Well nourished, well developed in no acute distress HEENT: Normal NECK: No JVD; No carotid bruits LYMPHATICS: No lymphadenopathy CARDIAC: RRR, no murmurs, rubs, gallops RESPIRATORY:  Clear to auscultation without rales, wheezing or rhonchi  ABDOMEN: Soft, non-tender, non-distended MUSCULOSKELETAL:  No edema; No deformity  SKIN: Warm and dry NEUROLOGIC:  Alert and oriented x 3 PSYCHIATRIC:  Normal affect   ASSESSMENT:  1. Pre-op evaluation   2. Preoperative cardiovascular examination   3. Primary hypertension   4. Precordial pain   5. Coronary artery disease involving native coronary artery of native heart without angina pectoris   6. Hyperlipidemia with target LDL less than 70    PLAN:    In order of problems listed  above:  CAD Moderate TPV - continue ASA, PRN NTG, 40 mg crestor  - risk factor modification - LDL 71 -She will restart aspirin    Hyperlipidemia with LDL goal < 70 07/26/2024: Cholesterol, Total 175; HDL 88; LDL Chol Calc (NIH) 71; Triglycerides 89 - continue 40 mg crestor    Hypertension - continue 20 mg lisinopril  - will add 5 mg amlodipine   DM - A1c 5.9% - continue metformin     Follow-up in 6 months to monitor blood pressure.  I suspect we may be able to reduce her antihypertensive regimen with weight loss and once recovered from pain following foot surgery.  She may proceed with breast reduction surgery at acceptable risk. She may interrupt ASA as needed per surgeon.       Medication Adjustments/Labs and Tests Ordered: Current medicines are reviewed at length with the patient today.  Concerns regarding medicines are outlined above.  Orders Placed This Encounter  Procedures   EKG 12-Lead   Meds ordered this encounter  Medications   amLODipine (NORVASC) 5 MG tablet    Sig: Take 1 tablet (5 mg total) by mouth daily.    Dispense:  90 tablet    Refill:  3    Patient Instructions  Medication Instructions:  Start amlodipine 5 mg daily *If you need a refill on your cardiac medications before your next appointment, please call your pharmacy*  Lab Work: None ordered If you have labs (blood work) drawn today and your tests are completely normal, you will receive your results only by: MyChart Message (if you have MyChart) OR A paper copy in the mail If you have any lab test that is abnormal or we need to change your treatment, we will call you to review the results.  Follow-Up: At [facility], you and your health needs are our priority.  As part of our continuing mission to provide you with exceptional heart care, our providers are all part of one team.  This team includes your primary Cardiologist (physician) and Advanced Practice Providers or APPs (Physician  Assistants and Nurse Practitioners) who all work together to provide you with the care you need, when you need it.  Your next appointment:   6 month(s)  Provider:   Vina Gull, MD or Haley Hails, PA-C           Signed, Haley White [place], [place]  08/18/2024 12:22 PM    [facility]

## 2024-08-18 ENCOUNTER — Encounter: Payer: Self-pay | Admitting: Physician Assistant

## 2024-08-18 ENCOUNTER — Ambulatory Visit: Attending: Cardiovascular Disease | Admitting: Physician Assistant

## 2024-08-18 VITALS — BP 146/87 | HR 71 | Ht 63.0 in | Wt 237.0 lb

## 2024-08-18 DIAGNOSIS — Z01810 Encounter for preprocedural cardiovascular examination: Secondary | ICD-10-CM | POA: Diagnosis not present

## 2024-08-18 DIAGNOSIS — R072 Precordial pain: Secondary | ICD-10-CM | POA: Diagnosis not present

## 2024-08-18 DIAGNOSIS — I2510 Atherosclerotic heart disease of native coronary artery without angina pectoris: Secondary | ICD-10-CM | POA: Diagnosis not present

## 2024-08-18 DIAGNOSIS — Z01818 Encounter for other preprocedural examination: Secondary | ICD-10-CM

## 2024-08-18 DIAGNOSIS — E785 Hyperlipidemia, unspecified: Secondary | ICD-10-CM

## 2024-08-18 DIAGNOSIS — I10 Essential (primary) hypertension: Secondary | ICD-10-CM | POA: Diagnosis not present

## 2024-08-18 MED ORDER — AMLODIPINE BESYLATE 5 MG PO TABS: 5.0000 mg | ORAL_TABLET | Freq: Every day | ORAL | 3 refills | Status: AC

## 2024-08-18 NOTE — Patient Instructions (Signed)
 Medication Instructions:  Start amlodipine 5 mg daily *If you need a refill on your cardiac medications before your next appointment, please call your pharmacy*  Lab Work: None ordered If you have labs (blood work) drawn today and your tests are completely normal, you will receive your results only by: MyChart Message (if you have MyChart) OR A paper copy in the mail If you have any lab test that is abnormal or we need to change your treatment, we will call you to review the results.  Follow-Up: At [facility], you and your health needs are our priority.  As part of our continuing mission to provide you with exceptional heart care, our providers are all part of one team.  This team includes your primary Cardiologist (physician) and Advanced Practice Providers or APPs (Physician Assistants and Nurse Practitioners) who all work together to provide you with the care you need, when you need it.  Your next appointment:   6 month(s)  Provider:   Vina Gull, MD or Jon Hails, PA-C

## 2024-08-19 ENCOUNTER — Encounter: Payer: Self-pay | Admitting: Podiatry

## 2024-08-19 ENCOUNTER — Ambulatory Visit (INDEPENDENT_AMBULATORY_CARE_PROVIDER_SITE_OTHER): Admitting: Podiatry

## 2024-08-19 DIAGNOSIS — M898X9 Other specified disorders of bone, unspecified site: Secondary | ICD-10-CM

## 2024-08-19 DIAGNOSIS — M67472 Ganglion, left ankle and foot: Secondary | ICD-10-CM

## 2024-08-19 MED ORDER — OXYCODONE-ACETAMINOPHEN 5-325 MG PO TABS: 1.0000 | ORAL_TABLET | Freq: Four times a day (QID) | ORAL | 0 refills | Status: DC | PRN

## 2024-08-19 MED ORDER — JOURNAVX 50 MG PO TABS: 1.0000 | ORAL_TABLET | Freq: Two times a day (BID) | ORAL | 0 refills | Status: DC

## 2024-08-23 ENCOUNTER — Encounter: Payer: Self-pay | Admitting: Podiatry

## 2024-08-23 NOTE — Progress Notes (Signed)
 Subjective: Chief Complaint  Patient presents with   Routine Post Op    Rm11 POV 2 DOS 10/0/2025 left foot excision of soft tissue/patient say she is feeling sharp stabbing pain on top of foot.   68 year old female presents the office the above concerns.  Said that she has been doing well.  Swelling pain to the foot.  She does not report any recent injuries or changes.  She is walking in the cam boot using a cane for assistance.  She has no other concerns today.  No fevers or chills.  Objective: AAO x3, NAD DP/PT pulses palpable bilaterally, CRT less than 3 seconds Incision well coapted with sutures intact.  There are still some motion across the incision noted today.  There is no cellulitis present.  There is no drainage or pus or signs of infection.  Tenderness palpation at surgical site. No pain with calf compression, swelling, warmth, erythema  Assessment: Status post exostectomy  Plan: -All treatment options discussed with the patient including all alternatives, risks, complications.  -All of the sutures intact upon removing these next week.  Dressing was reapplied.  Remain in cam boot, ice, elevation as well as compression.  Pain medication if needed.  Prescribed suzetrigine.  -Monitor for any clinical signs or symptoms of infection and directed to call the office immediately should any occur or go to the ER. -Patient encouraged to call the office with any questions, concerns, change in symptoms.   Return in about 1 week (around 08/26/2024) for suture removal.  Donnice JONELLE Fees DPM

## 2024-08-25 ENCOUNTER — Encounter: Payer: Self-pay | Admitting: Podiatry

## 2024-08-25 ENCOUNTER — Other Ambulatory Visit: Payer: Self-pay | Admitting: Podiatry

## 2024-08-25 MED ORDER — CEPHALEXIN 500 MG PO CAPS: 500.0000 mg | ORAL_CAPSULE | Freq: Three times a day (TID) | ORAL | 0 refills | Status: DC

## 2024-08-26 ENCOUNTER — Ambulatory Visit: Admitting: Podiatry

## 2024-08-26 DIAGNOSIS — M898X9 Other specified disorders of bone, unspecified site: Secondary | ICD-10-CM

## 2024-08-26 DIAGNOSIS — M67472 Ganglion, left ankle and foot: Secondary | ICD-10-CM

## 2024-08-26 NOTE — Progress Notes (Unsigned)
 Patient presents for post-op visit today, DOS 08/04/2024 left foot excision of soft tissue   Sent message yesterday to Dr. Gershon with photo. Had large amount of blood and yellow stuff. Medication was sent in.When I take the bandage off it really starts hurting.  RN Notes: n/a  Vital Signs: Today's Vitals   08/26/24 0948  PainSc: 2   PainLoc: Foot      Radiographs: []  Taken [x]  Not taken  Surgical Site Assessment:  - Dressing:  [x]  Minimal dry blood, intact []  Reinforced   []  Changed     -RN Notes: n/a  - Incision:  [x]  CDI (clean, dry, intact)  [x]  Mild erythema  []  Drainage noted   -RN Notes: n/a  - Swelling:  []  None  [x]  Mild  []  Moderate   []  Significant     -RN Notes: n/a  - Bruising:  [x]  None  []  Present: n/a   - Sutures/Staples:  []  None [x]  Intact  [x]  Removed Today  []  Plan to remove at next visit   -Cast/Splint/Pins: [x]  None []  Intact []  Removed Today []  Plan to remove at next visit []  Replaced  -Signs of infection:  [x]  None  []  Present - Describe: n/a  -DME:    []  None [x]  AFW []  Surgical shoe []  Cast  []  Splint  -Walking status:  [x]  Full WB  []  Partial WB  []  NWB  -Utilizing device:  [x]  None []  Knee Scooter []  Crutches []  Wheelchair    DVT assessment:  [x]  Denies symptoms []  Chest pain/SOB []  Pain in calf/redness/warmth   Redressed DSD and ace wrap. Educated on signs of infection, proper dressing care, pain management, and weight bearing status. Patient will contact provider with any new or worsening symptoms. The provider assessed the patient today and reviewed instructions regarding plan of care.  -  Patient seen and evaluated.  States that she has been on her feet more.  She sent a message yesterday and she was started on antibiotics.  Today the sutures removed and there is no evidence of dehiscence.  There is localized edema and erythema on the incision but there is no ascending cellulitis.  Discomfort on surgical site but no other areas of  discomfort.  There is no malodor.  She does not report any fevers or chills.  She is to continue to monitor.  Continue antibiotics.  Right offloading shoe and limited activity, ice, elevation.   Haley White DPM

## 2024-08-26 NOTE — Progress Notes (Signed)
   Referring Provider Laurice President, NP [address] [place],  [place] [address]   CC: No chief complaint on file.     Haley White is an 68 y.o. female.  HPI: Patient is a 68 y.o. year old female who presents for virtual visit for follow up to discuss possibility of breast reduction surgery.   She was seen for initial consult by Dr. Lowery.  At that time, patient reported upper back and neck pain due to her enlarged breasts.  On exam, her STN on the right was 32 cm and her STN on the left was 33 cm.  Her BMI was 44.3 kg/m.  Her preoperative bra size was an E cup.  The estimated amount of breast tissue to be removed at the time of surgery was 750 to 800 g bilaterally.  At this visit, patient was currently doing physical therapy and was also planning on joining healthy weight and wellness.  The patient was found to be a good candidate for bilateral breast reduction with liposuction.  The plan was for her to come back after she finished physical therapy.  Patient was last seen in the clinic on 07/28/2024.  At this visit, patient reported she was doing well and had been doing physical therapy.  She reported that things improved a little bit, but was still having back pain.  Patient wanted to move forward with breast reduction.  She did reports she was undergoing a cardiac workup at that time and also had surgery with orthopedics coming up around that time as well.  It was recommended that patient follow back up after her foot surgery and wait to submit when she was recovered from that.  Clearance was also sent to her cardiologist.  Per chart review, patient saw cardiology on 08/18/2024.  Per cardiology note, She may proceed with breast reduction surgery at acceptable risk. She may interrupt ASA as needed per surgeon   Today,  Review of Systems General: ***  Physical Exam    08/18/2024   11:28 AM 08/10/2024    3:36 PM 08/09/2024    4:29 PM  Vitals with BMI  Height 5' 3 5'  3   Weight 237 lbs 232 lbs   BMI 41.99 41.11   Systolic 146 143 856  Diastolic 87 87 82  Pulse 71 87 75    General:  No acute distress,  Alert and oriented, Non-Toxic, Normal speech and affect ***   Assessment/Plan ***  Haley White 08/26/2024, 1:09 PM

## 2024-08-27 ENCOUNTER — Telehealth: Admitting: Student

## 2024-08-27 DIAGNOSIS — M549 Dorsalgia, unspecified: Secondary | ICD-10-CM

## 2024-08-27 DIAGNOSIS — N62 Hypertrophy of breast: Secondary | ICD-10-CM | POA: Diagnosis not present

## 2024-08-30 ENCOUNTER — Encounter (INDEPENDENT_AMBULATORY_CARE_PROVIDER_SITE_OTHER): Payer: Self-pay | Admitting: Internal Medicine

## 2024-08-30 ENCOUNTER — Ambulatory Visit (INDEPENDENT_AMBULATORY_CARE_PROVIDER_SITE_OTHER): Admitting: Internal Medicine

## 2024-08-30 VITALS — BP 131/75 | HR 101 | Temp 98.2°F | Ht 62.0 in | Wt 224.0 lb

## 2024-08-30 DIAGNOSIS — M545 Low back pain, unspecified: Secondary | ICD-10-CM | POA: Diagnosis not present

## 2024-08-30 DIAGNOSIS — R7303 Prediabetes: Secondary | ICD-10-CM | POA: Diagnosis not present

## 2024-08-30 DIAGNOSIS — Z6841 Body Mass Index (BMI) 40.0 and over, adult: Secondary | ICD-10-CM

## 2024-08-30 DIAGNOSIS — E66813 Obesity, class 3: Secondary | ICD-10-CM | POA: Diagnosis not present

## 2024-08-30 DIAGNOSIS — G8929 Other chronic pain: Secondary | ICD-10-CM | POA: Diagnosis not present

## 2024-08-30 MED ORDER — METFORMIN HCL ER 500 MG PO TB24: 500.0000 mg | ORAL_TABLET | Freq: Every day | ORAL | 0 refills | Status: DC

## 2024-08-30 NOTE — Progress Notes (Signed)
 Office: [phone]  /  Fax: [phone]  Weight Summary and Body Composition Analysis (BIA)  Vitals Temp: 98.2 F (36.8 C) BP: 131/75 Pulse Rate: (!) 101 SpO2: 97 %   Anthropometric Measurements Height: 5' 2 (1.575 m) Weight: 224 lb (101.6 kg) BMI (Calculated): 40.96 Weight at Last Visit: 235lb Weight Lost Since Last Visit: 11lb Weight Gained Since Last Visit: 0lb Starting Weight: 235lb Total Weight Loss (lbs): 11 lb (4.99 kg) Peak Weight: 245lb Waist Measurement : 49 inches   Body Composition  Body Fat %: 48.2 % Fat Mass (lbs): 108.4 lbs Muscle Mass (lbs): 110.6 lbs Total Body Water (lbs): 77.8 lbs Visceral Fat Rating : 17    RMR: 1598  Today's Visit #: 3  Starting Date: 07/26/24   Subjective   Chief Complaint: Obesity  Interval History Discussed the use of AI scribe software for clinical note transcription with the patient, who gave verbal consent to proceed.  History of Present Illness Haley White is a 68 year old female with prediabetes and obesity who presents for medical weight management.  She has lost eleven pounds since her last visit by following a diet plan that includes protein shakes and meal prep services, maintaining a daily caloric intake of 1200 calories. She finds the process manageable and has not faced significant challenges in maintaining her dietary regimen. Her body fat percentage has decreased from 50.9% to 48%. Her visceral fat has decreased from 18 to 17. She is satisfied with her progress and the dietary changes she has implemented.  She has a history of prediabetes and is currently taking metformin, which she believes helps control her appetite and reduce cravings for sweets. No gastrointestinal side effects from the medication are reported. She hopes her weight loss will help her move out of the prediabetic range.  She also has a history of rheumatoid arthritis and asthma. Due to a bad back and a foot injury, which required  a boot and stitches, her physical activity is limited to walking her dog. She has not experienced any stomach issues with her medications, which include fluoxetine , bupropion , amlodipine, and Ritalin . She is concerned about potential drug interactions, particularly regarding her heart rate. She monitors her heart rate at home using an oximeter.     Challenges affecting patient progress: orthopedic problems, medical conditions or chronic pain affecting mobility, medical comorbidities, and menopause.    Pharmacotherapy for weight management: She is currently taking Metformin (off label use for weight management and / or insulin  resistance and / or diabetes prevention) with adequate clinical response  and without side effects..   Assessment and Plan   Treatment Plan For Obesity:  Recommended Dietary Goals  Silvie is currently in the action stage of change. As such, her goal is to continue weight management plan. She has agreed to: continue current plan  Behavioral Health and Counseling  We discussed the following behavioral modification strategies today: continue to work on maintaining a reduced calorie state, getting the recommended amount of protein, incorporating whole foods, making healthy choices, staying well hydrated and practicing mindfulness when eating. and increase protein intake, fibrous foods (25 grams per day for women, 30 grams for men) and water to improve satiety and decrease hunger signals. .  Additional education and resources provided today: None  Recommended Physical Activity Goals  [name] has been advised to work up to 150 minutes of moderate intensity aerobic activity a week and strengthening exercises 2-3 times per week for cardiovascular health, weight loss maintenance and  preservation of muscle mass.  She has agreed to :  Think about enjoyable ways to increase daily physical activity and overcoming barriers to exercise, Increase physical activity in their day and  reduce sedentary time (increase NEAT)., Increase volume of physical activity to a goal of 240 minutes a week, and Combine aerobic and strengthening exercises for efficiency and improved cardiometabolic health.  Medical Interventions and Pharmacotherapy  We discussed various medication options to help [facility] with her weight loss efforts and we both agreed to : Adequate clinical response to anti-obesity medication, continue current regimen  Associated Conditions Impacted by Obesity Treatment  Assessment & Plan Prediabetes Most recent A1c is  Lab Results  Component Value Date   HGBA1C 5.9 (H) 07/26/2024   HGBA1C 5.9 (H) 03/30/2015    Patient aware of disease state and risk of progression. This may contribute to abnormal cravings, fatigue and diabetic complications without having diabetes.   We have discussed treatment options which include: losing 7 to 10% of body weight, increasing physical activity to a goal of 150 minutes a week at moderate intensity.  She is not on metformin for pharmacoprophylaxis without any side effects continue medication. Class 3 severe obesity with serious comorbidity and body mass index (BMI) of 40.0 to 44.9 in adult, unspecified obesity type (HCC) Significant weight loss of 11 pounds, rapid but expected due to mobilization of glycogen and water stores. Body fat percentage decreased from 50.9% to 48%, with no muscle loss and a gain of 1 pound of muscle. BMI reduced from 43 to 40.9. Adhering to a 1200 calorie diet with meal replacements and protein shakes, utilizing meal prep services. - Continue 1200 calorie diet with meal replacements. - Encourage self-monitoring of dietary intake and weight. - Introduce chair exercises 2-3 times a week. - Encourage short walks as tolerated. Chronic bilateral low back pain without sciatica Chronic back pain limits physical activity. - Introduce chair exercises for seniors or beginners using online resources like [employer]. -  Encourage use of dumbbells as strength improves.         Objective   Physical Exam:  Blood pressure 131/75, pulse (!) 101, temperature 98.2 F (36.8 C), height 5' 2 (1.575 m), weight 224 lb (101.6 kg), last menstrual period 11/26/2011, SpO2 97%. Body mass index is 40.97 kg/m.  General: She is overweight, cooperative, alert, well developed, and in no acute distress. PSYCH: Has normal mood, affect and thought process.   HEENT: EOMI, sclerae are anicteric. Lungs: Normal breathing effort, no conversational dyspnea. Extremities: No edema.  Neurologic: No gross sensory or motor deficits. No tremors or fasciculations noted.    Diagnostic Data Reviewed:  BMET    Component Value Date/Time   NA 145 (H) 07/26/2024 1022   K 4.1 07/26/2024 1022   CL 105 07/26/2024 1022   CO2 25 07/26/2024 1022   GLUCOSE 86 07/26/2024 1022   GLUCOSE 121 (H) 06/29/2024 1737   BUN 15 07/26/2024 1022   CREATININE 0.94 07/26/2024 1022   CREATININE 0.82 10/24/2023 1124   CALCIUM  9.1 07/26/2024 1022   GFRNONAA >60 06/29/2024 1707   GFRNONAA 56 (L) 01/10/2021 0852   GFRAA 65 01/10/2021 0852   Lab Results  Component Value Date   HGBA1C 5.9 (H) 07/26/2024   HGBA1C 5.9 (H) 03/30/2015   Lab Results  Component Value Date   INSULIN  16.6 07/26/2024   Lab Results  Component Value Date   TSH 0.95 01/22/2023   CBC    Component Value Date/Time   WBC 7.8  06/29/2024 1707   RBC 3.87 06/29/2024 1707   HGB 11.9 (L) 06/29/2024 1737   HCT 35.0 (L) 06/29/2024 1737   PLT 254 06/29/2024 1707   MCV 94.1 06/29/2024 1707   MCV 97.5 (A) 11/28/2013 1334   MCH 31.0 06/29/2024 1707   MCHC 33.0 06/29/2024 1707   RDW 12.0 06/29/2024 1707   Iron Studies    Component Value Date/Time   IRON 88 07/26/2024 1022   TIBC 268 07/26/2024 1022   FERRITIN 172 (H) 07/26/2024 1022   IRONPCTSAT 33 07/26/2024 1022   IRONPCTSAT 38 01/10/2021 0852   Lipid Panel     Component Value Date/Time   CHOL 175 07/26/2024 1022    TRIG 89 07/26/2024 1022   HDL 88 07/26/2024 1022   CHOLHDL 2.2 10/24/2023 1124   VLDL 21 03/30/2015 0926   LDLCALC 71 07/26/2024 1022   LDLCALC 92 10/24/2023 1124   Hepatic Function Panel     Component Value Date/Time   PROT 6.2 07/26/2024 1022   ALBUMIN 4.4 07/26/2024 1022   AST 16 07/26/2024 1022   ALT 19 07/26/2024 1022   ALKPHOS 77 07/26/2024 1022   BILITOT 0.7 07/26/2024 1022   BILIDIR 0.2 10/08/2017 1144   IBILI 0.8 10/08/2017 1144      Component Value Date/Time   TSH 0.95 01/22/2023 1152   Nutritional Lab Results  Component Value Date   VD25OH 68.1 07/26/2024   VD25OH 80.96 06/02/2023   VD25OH 91 01/22/2023    Medications: Outpatient Encounter Medications as of 08/30/2024  Medication Sig   albuterol  (VENTOLIN  HFA) 108 (90 Base) MCG/ACT inhaler Inhale 2 puffs into the lungs every 6 (six) hours as needed for wheezing or shortness of breath.   amLODipine (NORVASC) 5 MG tablet Take 1 tablet (5 mg total) by mouth daily.   aspirin  EC 81 MG tablet Take 1 tablet (81 mg total) by mouth daily. Swallow whole.   azelastine  (ASTELIN ) 0.1 % nasal spray Place 1 spray into both nostrils 2 (two) times daily. Use in each nostril as directed   budesonide  (PULMICORT ) 0.5 MG/2ML nebulizer solution Take 2 mLs (0.5 mg total) by nebulization daily as needed.   buPROPion  (WELLBUTRIN  XL) 300 MG 24 hr tablet Take 300 mg by mouth daily.   cephALEXin  (KEFLEX ) 500 MG capsule Take 1 capsule (500 mg total) by mouth 3 (three) times daily.   cetirizine  (ZYRTEC  ALLERGY ) 10 MG tablet Take 1 tablet (10 mg total) by mouth daily.   EPINEPHrine  0.3 mg/0.3 mL IJ SOAJ injection See admin instructions.   FLUoxetine  (PROZAC ) 20 MG capsule Take 60 mg by mouth daily.   fluticasone  (FLONASE) 50 MCG/ACT nasal spray Place 1 spray into both nostrils daily as needed for allergies or rhinitis.   lisinopril  (ZESTRIL ) 20 MG tablet Take 20 mg by mouth daily.   methylphenidate  (RITALIN ) 10 MG tablet Take 10 mg by mouth  2 (two) times daily.   montelukast  (SINGULAIR ) 10 MG tablet Take 10 mg by mouth at bedtime.   NON FORMULARY Inject 1 Dose into the skin once a week. Allergy  shots from [facility]  at [place]. Patient not sure about the name of medicine   oxyCODONE -acetaminophen  (PERCOCET/ROXICET) 5-325 MG tablet Take 1-2 tablets by mouth every 6 (six) hours as needed for severe pain (pain score 7-10).   pantoprazole  (PROTONIX ) 40 MG tablet TAKE 1 TABLET BY MOUTH DAILY TO PREVENT INDIGESTION AND HEARTBURN   rosuvastatin  (CRESTOR ) 40 MG tablet Take  1 tablet Daily for Cholesterol            \  TAKE  BY MOUTH   Suzetrigine (JOURNAVX) 50 MG TABS Take 1 Application by mouth 2 (two) times daily.   SYMBICORT 160-4.5 MCG/ACT inhaler Inhale 2 puffs into the lungs 2 (two) times daily.   traZODone  (DESYREL ) 100 MG tablet Take 1 tablet (100 mg total) by mouth at bedtime.   VITAMIN D  PO Take 10,000 Units by mouth daily.   [DISCONTINUED] metFORMIN (GLUCOPHAGE-XR) 500 MG 24 hr tablet Take 1 tablet (500 mg total) by mouth daily with breakfast.   metFORMIN (GLUCOPHAGE-XR) 500 MG 24 hr tablet Take 1 tablet (500 mg total) by mouth daily with breakfast.   [DISCONTINUED] cephALEXin  (KEFLEX ) 500 MG capsule Take 1 capsule (500 mg total) by mouth 3 (three) times daily.   Facility-Administered Encounter Medications as of 08/30/2024  Medication   ipratropium-albuterol  (DUONEB) 0.5-2.5 (3) MG/3ML nebulizer solution 3 mL     Follow-Up   Return in about 3 weeks (around 09/20/2024) for For Weight Mangement with Dr. Francyne.SABRA She was informed of the importance of frequent follow up visits to maximize her success with intensive lifestyle modifications for her multiple health conditions.  Attestation Statement   Reviewed by clinician on day of visit: allergies, medications, problem list, medical history, surgical history, family history, social history, and previous encounter notes.     Lucas Francyne, MD

## 2024-08-30 NOTE — Assessment & Plan Note (Signed)
 Chronic back pain limits physical activity. - Introduce chair exercises for seniors or beginners using online resources like [employer]. - Encourage use of dumbbells as strength improves.

## 2024-08-30 NOTE — Assessment & Plan Note (Signed)
 Most recent A1c is  Lab Results  Component Value Date   HGBA1C 5.9 (H) 07/26/2024   HGBA1C 5.9 (H) 03/30/2015    Patient aware of disease state and risk of progression. This may contribute to abnormal cravings, fatigue and diabetic complications without having diabetes.   We have discussed treatment options which include: losing 7 to 10% of body weight, increasing physical activity to a goal of 150 minutes a week at moderate intensity.  She is not on metformin for pharmacoprophylaxis without any side effects continue medication.

## 2024-08-30 NOTE — Assessment & Plan Note (Signed)
 Significant weight loss of 11 pounds, rapid but expected due to mobilization of glycogen and water stores. Body fat percentage decreased from 50.9% to 48%, with no muscle loss and a gain of 1 pound of muscle. BMI reduced from 43 to 40.9. Adhering to a 1200 calorie diet with meal replacements and protein shakes, utilizing meal prep services. - Continue 1200 calorie diet with meal replacements. - Encourage self-monitoring of dietary intake and weight. - Introduce chair exercises 2-3 times a week. - Encourage short walks as tolerated.

## 2024-09-02 ENCOUNTER — Encounter: Admitting: Podiatry

## 2024-09-13 ENCOUNTER — Other Ambulatory Visit: Payer: Self-pay | Admitting: Podiatry

## 2024-09-13 MED ORDER — SULFAMETHOXAZOLE-TRIMETHOPRIM 800-160 MG PO TABS: 1.0000 | ORAL_TABLET | Freq: Two times a day (BID) | ORAL | 0 refills | Status: DC

## 2024-09-13 MED ORDER — MUPIROCIN 2 % EX OINT: 1.0000 | TOPICAL_OINTMENT | Freq: Two times a day (BID) | CUTANEOUS | 2 refills | Status: DC

## 2024-09-14 ENCOUNTER — Encounter: Payer: Self-pay | Admitting: Podiatry

## 2024-09-14 ENCOUNTER — Ambulatory Visit (INDEPENDENT_AMBULATORY_CARE_PROVIDER_SITE_OTHER): Admitting: Podiatry

## 2024-09-14 VITALS — BP 122/71 | HR 78 | Temp 99.0°F

## 2024-09-14 DIAGNOSIS — M898X9 Other specified disorders of bone, unspecified site: Secondary | ICD-10-CM

## 2024-09-14 DIAGNOSIS — M67472 Ganglion, left ankle and foot: Secondary | ICD-10-CM

## 2024-09-14 NOTE — Progress Notes (Signed)
 Subjective: Chief Complaint  Patient presents with   Wound Check    Rm12 pt complains of  thick yellow discharge from wound/ itching and burning/ taking antibiotics/not diabetic/1 week.     68 year old female presents the office the above concerns.  She states about 1 week ago was when she started noticing increased drainage and discomfort to the top of the foot.  That time she went into a surgical shoe.  Prior to that she said that she was not wearing anything on her foot.  She does not recall any recent injuries.  No fevers or chills at this time other than a low-grade fever last night.    Objective: Vitals:   09/14/24 0831  BP: 122/71  Pulse: 78  Temp: 99 F (37.2 C)  SpO2: 95%    AAO x3, NAD DP/PT pulses palpable bilaterally, CRT less than 3 seconds As pictured below in the dorsal aspect the following incision is a scab.  I am unable to appreciate any drainage or purulence today but small notably drainage is on the bandage.  Localized erythema without any ascending size but there is no drainage or pus otherwise.  There is no fluctuation or crepitation.  Tenderness palpation on the surgical site.  No pain with calf compression, swelling, warmth, erythema    Assessment: Status post exostectomy  Plan: -All treatment options discussed with the patient including all alternatives, risks, complications.  -We the incision.  She was started on antibiotics last night.  She is gena use Maxorb dressing changes daily.  If it starts to dry all she can switch back to antibiotic ointment dressing changes.  Manage surgical shoe, elevation. -Monitor for any clinical signs or symptoms of infection and directed to call the office immediately should any occur or go to the ER.  No follow-ups on file.  Haley White DPM

## 2024-09-15 ENCOUNTER — Ambulatory Visit (INDEPENDENT_AMBULATORY_CARE_PROVIDER_SITE_OTHER): Admitting: Internal Medicine

## 2024-09-15 ENCOUNTER — Encounter: Payer: Self-pay | Admitting: Internal Medicine

## 2024-09-15 VITALS — BP 118/74 | HR 75 | Temp 98.5°F | Ht 63.0 in | Wt 228.2 lb

## 2024-09-15 DIAGNOSIS — K219 Gastro-esophageal reflux disease without esophagitis: Secondary | ICD-10-CM | POA: Diagnosis not present

## 2024-09-15 DIAGNOSIS — J301 Allergic rhinitis due to pollen: Secondary | ICD-10-CM | POA: Diagnosis not present

## 2024-09-15 DIAGNOSIS — J4540 Moderate persistent asthma, uncomplicated: Secondary | ICD-10-CM | POA: Diagnosis not present

## 2024-09-15 DIAGNOSIS — G4733 Obstructive sleep apnea (adult) (pediatric): Secondary | ICD-10-CM

## 2024-09-15 NOTE — Patient Instructions (Addendum)
 It was a pleasure to see you today!  Please schedule follow up with Dr. Theodoro in 6 months. Please call sooner [phone] if issues or concerns arise. You can also send us  a message through MyChart, but but aware that this is not to be used for urgent issues and it may take up to 5-7 days to receive a reply. Please be aware that you will likely be able to view your results before I have a chance to respond to them. Please give us  5 business days to respond to any non-urgent results.    Continue zyrtec   continue symbicort 2 puffs once daily, gargle after use.   If you are sick or could get sick start taking budesonide  nebs daily and extend for three more days after you are feeling better.   Continue reflux and allergy  medications.  Continue allergy  shots.   Sleep apnea is well controlled with minimal leak from the mask in the last 30 days.

## 2024-09-15 NOTE — Progress Notes (Signed)
 Haley White    [id number]    11/10/1955  Primary Care Physician:Cranford, Bascom, NP Date of Appointment: 09/15/2024 Established Patient Visit  Chief complaint:   Chief Complaint  Patient presents with   Asthma    Follow up   Obstructive Sleep Apnea     HPI: Haley White is a 68 y.o. woman with moderate persistent asthma and covid 19 infection in June 2023. History of provoked pulmonary embolism with covid, treated with six months of eliquis .   Interval Updates: Here for follow up for asthma and OSA.  No interval exacerbations, still on maintenance therapy as below.  She had an episode of shingles about 2 weeks ago. Went two weeks without SCIT for this, and felt her allergies worsened.   CPAP download reviewed 100% adherence. Autocpap 5-15 no leak. AHI 5.1 median pressure 13 cm H20  Current Regimen: 2 puffs in the morning bid symbicort, prn budesonide  neb.  Asthma Triggers: URIs, seasonal allergies Exacerbations in the last year: none History of hospitalization or intubation: none Allergy  Testing/rhinitis: yes on zyrtec , singulair , flonase (follows at [place] [facility] ) GERD: yes on PPI ACT:  Asthma Control Test ACT Total Score  09/15/2024  2:56 PM 25  07/12/2024 10:20 AM 21  11/13/2023 11:06 AM 24   FeNO: Serum Eos/IgE:   I have reviewed the patient's family social and past medical history and updated as appropriate.   Past Medical History:  Diagnosis Date   ADHD    Allergy     Anxiety    Arthritis    Asthma    Back pain    Cataract    Chest pain    Depression    Fibromyalgia    GERD (gastroesophageal reflux disease)    Gout    Hx pulmonary embolism    Hyperlipidemia    Hypertension    Joint pain    Obese    Osteoarthritis    Pain in left ankle    Palpitations    Pre-diabetes    Sleep apnea    SOB (shortness of breath)    Vitamin D  deficiency     Past Surgical History:  Procedure Laterality Date   ANKLE SURGERY Left    APPENDECTOMY   1984   APPENDECTOMY     CATARACT EXTRACTION W/ INTRAOCULAR LENS  IMPLANT, BILATERAL  2014   CESAREAN SECTION  1984, 1987, 1994   X3   CESAREAN SECTION     x3   COLONOSCOPY  11/16/2013   w/Brodie    EXTERNAL FIXATION LEG Left 06/04/2023   Procedure: EXTERNAL FIXATION LEG;  Surgeon: Barton Drape, MD;  Location: [facility];  Service: Orthopedics;  Laterality: Left;   EYE SURGERY Bilateral    lasix   FOOT FRACTURE SURGERY Left    FOOT SURGERY Left 2002   FRACTURE SURGERY Left    left ankle   HARDWARE REMOVAL Left 12/03/2023   Procedure: HARDWARE REMOVAL OF SYNDEMOSIS;  Surgeon: Barton Drape, MD;  Location: [facility];  Service: Orthopedics;  Laterality: Left;   KNEE SURGERY Left 1998   ORIF ANKLE FRACTURE Left 06/04/2023   Procedure: OPEN REDUCTION INTERNAL FIXATION (ORIF) ANKLE FRACTURE vs EX FIX;  Surgeon: Barton Drape, MD;  Location: [facility];  Service: Orthopedics;  Laterality: Left;   ROTATOR CUFF REPAIR Right 2007   shoulder     torn rotator cuff   TUBAL LIGATION  1994   TUBAL LIGATION      Family  History  Problem Relation Age of Onset   Dementia Father    Heart disease Father    Hypertension Father    Hyperlipidemia Father    Deep vein thrombosis Father    Depression Father    Anxiety disorder Father    Anxiety disorder Sister    Colon cancer Neg Hx    Colon polyps Neg Hx    Esophageal cancer Neg Hx    Stomach cancer Neg Hx    Rectal cancer Neg Hx     Social History   Occupational History   Occupation: Retired Barrister's Clerk  Tobacco Use   Smoking status: Former    Types: Cigarettes    Passive exposure: Never   Smokeless tobacco: Never  Vaping Use   Vaping status: Never Used  Substance and Sexual Activity   Alcohol use: Yes    Comment: occasionally   Drug use: Never    Comment: 06/03/2022-gummies   Sexual activity: Yes    Birth control/protection: Post-menopausal, Surgical     Physical Exam: Blood pressure 118/74, pulse  75, temperature 98.5 F (36.9 C), temperature source Oral, height 5' 3 (1.6 m), weight 228 lb 3.2 oz (103.5 kg), last menstrual period 11/26/2011, SpO2 93%.  Gen:      No distress, well developed ENT:  mmm no thrush Lungs:    breathing non labored no wheeze CV:         RRR no mrg  Data Reviewed: Imaging: I have personally reviewed the CT Chest August 2025 shows no PE, no chronic parenchymal disease.   PFTs:     Latest Ref Rng & Units 06/14/2022   11:52 AM  PFT Results  FVC-Pre L 3.00   FVC-Predicted Pre % 102   FVC-Post L 2.98   FVC-Predicted Post % 101   Pre FEV1/FVC % % 84   Post FEV1/FCV % % 87   FEV1-Pre L 2.53   FEV1-Predicted Pre % 113   FEV1-Post L 2.58   DLCO uncorrected ml/min/mmHg 17.01   DLCO UNC% % 91   DLCO corrected ml/min/mmHg 18.78   DLCO COR %Predicted % 101   DLVA Predicted % 102   TLC L 5.07   TLC % Predicted % 106   RV % Predicted % 101    I have personally reviewed the patient's PFTs and they show normal pulmonary function.   Labs: Lab Results  Component Value Date   WBC 7.8 06/29/2024   HGB 11.9 (L) 06/29/2024   HCT 35.0 (L) 06/29/2024   MCV 94.1 06/29/2024   PLT 254 06/29/2024   Lab Results  Component Value Date   NA 145 (H) 07/26/2024   K 4.1 07/26/2024   CL 105 07/26/2024   CO2 25 07/26/2024     Immunization status: Immunization History  Administered Date(s) Administered   Fluzone Influenza virus vaccine,trivalent (IIV3), split virus 08/20/2018, 09/17/2022   INFLUENZA, HIGH DOSE SEASONAL PF 10/17/2017   Influenza, Quadrivalent, Recombinant, Inj, Pf 08/20/2018   Influenza,inj,Quad PF,6+ Mos 09/17/2017   Influenza-Unspecified 08/20/2018, 08/04/2022   Moderna Sars-Covid-2 Vaccination 01/20/2020, 02/10/2020, 10/05/2020, 04/10/2021   PFIZER(Purple Top)SARS-COV-2 Vaccination 01/20/2020, 02/10/2020, 03/27/2020   PPD Test 10/08/2017, 11/29/2019, 01/10/2021   Pneumococcal Conjugate-13 11/05/1995   Pneumococcal Polysaccharide-23  10/08/2017, 06/15/2021   Td 11/29/2019   Tdap 11/04/2008, 03/28/2020   Unspecified SARS-COV-2 Vaccination 01/21/2020, 02/11/2020    External Records Personally Reviewed:   Assessment:  Mild intermittent asthma, controlled Gerd, controlled Seasonal allergies, controlled OSA on CPAP controlled  Plan/Recommendations:  Continue zyrtec   continue symbicort 2 puffs once daily, gargle after use.   If you are sick or could get sick start taking budesonide  nebs daily and extend for three more days after you are feeling better.   Continue reflux and allergy  medications.  Continue allergy  shots.   Sleep apnea is well controlled with minimal leak from the mask in the last 30 days.    Return to Care: Return in about 6 months (around 03/15/2025) for Dr Theodoro.   Verdon Gore, MD Pulmonary and Critical Care Medicine [facility] Office:[phone]

## 2024-09-24 ENCOUNTER — Ambulatory Visit (INDEPENDENT_AMBULATORY_CARE_PROVIDER_SITE_OTHER): Admitting: Podiatry

## 2024-09-24 ENCOUNTER — Encounter: Payer: Self-pay | Admitting: Podiatry

## 2024-09-24 DIAGNOSIS — M898X9 Other specified disorders of bone, unspecified site: Secondary | ICD-10-CM

## 2024-09-24 DIAGNOSIS — M67472 Ganglion, left ankle and foot: Secondary | ICD-10-CM

## 2024-09-24 DIAGNOSIS — R2681 Unsteadiness on feet: Secondary | ICD-10-CM

## 2024-09-24 NOTE — Progress Notes (Signed)
 Subjective: Chief Complaint  Patient presents with   Wound Check    POV # 3 DOS 10/1 25 LEFT  DORSAL FOOT EXCISION OF SOFT TISSUE MASS. 4 pain. Non diabetic.     68 year old female presents the office the above concerns.  States that overall she is doing better.  She said the antibiotic she was on did give her thrush.  The symptoms have resolved.  She still wearing a surgical shoe.  No further drainage but she reports no fevers or chills.  She states that her pain is so-so, but seems to be improved.   Objective: AAO x3, NAD DP/PT pulses palpable bilaterally, CRT less than 3 seconds Small scabbing still present on the proximal portion incision but not able to appreciate any areas of drainage or purulence.  Some localized edema.  No palpable fluid collection noted.  There is no signs of cellulitis otherwise today.  Mild tenderness palpation at surgical site.  She gets more nerve type pain upon palpation adjacent to the incision.  No other areas of pinpoint tenderness. No pain with calf compression, swelling, warmth, erythema    Assessment: Status post exostectomy  Plan: -All treatment options discussed with the patient including all alternatives, risks, complications.  -Incisions healing better.  Still keep a bandage on the area during the day.  Remain in surgical shoe until the scabs come off and she can gradually start to transition back to regular shoe as tolerated.  Continue ice, elevate. -Referral to PT.  She was doing this for ankle fracture prior to the surgery and will restart this.  Return in about 6 weeks (around 11/05/2024).  Donnice JONELLE Fees DPM

## 2024-10-04 ENCOUNTER — Encounter (INDEPENDENT_AMBULATORY_CARE_PROVIDER_SITE_OTHER): Payer: Self-pay | Admitting: Internal Medicine

## 2024-10-04 ENCOUNTER — Ambulatory Visit (INDEPENDENT_AMBULATORY_CARE_PROVIDER_SITE_OTHER): Payer: Self-pay | Admitting: Internal Medicine

## 2024-10-04 VITALS — BP 139/88 | HR 77 | Temp 97.6°F | Ht 62.0 in | Wt 223.0 lb

## 2024-10-04 DIAGNOSIS — E66813 Obesity, class 3: Secondary | ICD-10-CM | POA: Diagnosis not present

## 2024-10-04 DIAGNOSIS — I10 Essential (primary) hypertension: Secondary | ICD-10-CM | POA: Diagnosis not present

## 2024-10-04 DIAGNOSIS — G4733 Obstructive sleep apnea (adult) (pediatric): Secondary | ICD-10-CM | POA: Diagnosis not present

## 2024-10-04 DIAGNOSIS — R7303 Prediabetes: Secondary | ICD-10-CM

## 2024-10-04 DIAGNOSIS — Z6841 Body Mass Index (BMI) 40.0 and over, adult: Secondary | ICD-10-CM

## 2024-10-04 MED ORDER — ZEPBOUND 2.5 MG/0.5ML ~~LOC~~ SOAJ: 2.5000 mg | SUBCUTANEOUS | 0 refills | Status: AC

## 2024-10-04 NOTE — Progress Notes (Unsigned)
 Office: [phone]  /  Fax: [phone]  Weight Summary and Body Composition Analysis (BIA)  Vitals Temp: 97.6 F (36.4 C) BP: 139/88 Pulse Rate: 77 SpO2: 99 %   Anthropometric Measurements Height: 5' 2 (1.575 m) Weight: 223 lb (101.2 kg) BMI (Calculated): 40.78 Weight at Last Visit: 224 lb Weight Lost Since Last Visit: 1 lb Weight Gained Since Last Visit: 0 lb Starting Weight: 235 lb Total Weight Loss (lbs): 12 lb (5.443 kg) Peak Weight: 245 lb   Body Composition  Body Fat %: 49.4 % Fat Mass (lbs): 110.2 lbs Muscle Mass (lbs): 107.2 lbs Total Body Water (lbs): 80.4 lbs Visceral Fat Rating : 17    RMR: 1598  Today's Visit #: 4  Starting Date: 07/26/24   Subjective   Chief Complaint: Obesity  Interval History Discussed the use of AI scribe software for clinical note transcription with the patient, who gave verbal consent to proceed.  History of Present Illness Haley White is a 68 year old female with hypertension and sleep apnea who presents for medical weight management.  She has experienced a weight loss of one pound since her last visit. She adheres to a 1200 calorie nutrition plan approximately 90% of the time and is not currently engaging in exercise. She feels she has reached a plateau in her weight loss journey and reports decreased physical activity due to foot issues.  She has a history of hypertension and is currently taking metformin . She has severe sleep apnea, diagnosed over a year ago with an AHI of 50.8. She has been using a CPAP machine for about a year and three months. Her sleep study was conducted in June 2024.  She is retired and lives alone in a condo. She is a member of [employer] and enjoys swimming in the summer. She tracks her calorie intake manually, averaging between 900 to 1100 calories daily, including low-calorie, high-protein snacks.  No difficulty with appetite and she feels satisfied after meals. She focuses on protein  intake and occasionally consumes Neurie drinks for breakfast.     Challenges affecting patient progress: none.    Pharmacotherapy for weight management: She is currently taking no anti-obesity medication and Metformin  (off label use for weight management and / or insulin  resistance and / or diabetes prevention) with adequate clinical response  and without side effects..   Assessment and Plan   Treatment Plan For Obesity:  Recommended Dietary Goals  Haley White is currently in the action stage of change. As such, her goal is to continue weight management plan. She has agreed to: continue current plan  Behavioral Health and Counseling  We discussed the following behavioral modification strategies today: continue to work on maintaining a reduced calorie state, getting the recommended amount of protein, incorporating whole foods, making healthy choices, staying well hydrated and practicing mindfulness when eating. and increase protein intake, fibrous foods (25 grams per day for women, 30 grams for men) and water to improve satiety and decrease hunger signals. .  Additional education and resources provided today: Handout on traveling and holiday eating strategies  Recommended Physical Activity Goals  Haley White has been advised to work up to 150 minutes of moderate intensity aerobic activity a week and strengthening exercises 2-3 times per week for cardiovascular health, weight loss maintenance and preservation of muscle mass.  She has agreed to :  Think about enjoyable ways to increase daily physical activity and overcoming barriers to exercise, Increase physical activity in their day and reduce sedentary time (  increase NEAT)., Increase volume of physical activity to a goal of 240 minutes a week, and Combine aerobic and strengthening exercises for efficiency and improved cardiometabolic health.  Medical Interventions and Pharmacotherapy  We discussed various medication options to help [facility] with her  weight loss efforts and we both agreed to : We discussed various medication options to help [facility] with her weight loss efforts and we both agreed to : start anti-obesity medication.  In addition to reduced calorie nutrition plan (RCNP), behavioral strategies and physical activity, Haley White would benefit from pharmacotherapy to assist with hunger signals, satiety and cravings. This will reduce obesity-related health risks by inducing weight loss, and help reduce food consumption and adherence to Haley White). It may also improve QOL by improving self-confidence and reduce the setbacks associated with metabolic adaptations.  He also has high risk comorbidities associated with her obesity including: Severe sleep apnea with an AHI 50.8 and oxygen desaturations symptomatic .  she also has hypertension, hyperlipidemi, asthma, prediabetes, hyperlipidemia and history of thromboembolic disease.  All of these conditions increase her risk for future complications and are either improved or potentially reverse through sustained weight loss.  GLP-1 receptor agonists have been shown in robust clinical trials to: Enhance satiety and delay gastric emptying, resulting in reduced caloric intake Improve insulin  sensitivity and glycemic control Promote clinically meaningful weight loss (>=5-22%) Reduce cardiovascular risk markers, including blood pressure, lipids, and inflammation Improved sleep apnea and associated complications by helping patient potentially lose more than 15% of total body weight.  This is usually what is required to reduce AHI.  Given Haley White's clinical profile--GLP-1 therapy is both indicated and expected to provide multifactorial benefit. The medication's mechanism aligns precisely with the patient's needs and addresses the physiologic underpinnings of weight gain.   After a detailed discussion covering treatment rationale, mechanism of action, expected outcomes, risks, and long-term use considerations, shared  decision-making was used to initiate Zepbound 2.5 mg once a week. The importance of ongoing lifestyle management and long-term adherence was emphasized, as was the potential for weight regain following discontinuation.  The delivery device was demonstrated, and using the teach-back method, Haley White successfully demonstrated proper injection technique. Ongoing monitoring and follow-up are planned to assess tolerability, clinical response, and reinforce behavioral strategies.   Associated Conditions Impacted by Obesity Treatment  Assessment & Plan Class 3 severe obesity with serious comorbidity and body mass index (BMI) of 40.0 to 44.9 in adult, unspecified obesity type (HCC) Prediabetes OSA on CPAP Primary hypertension Chronic condition affecting multiple organ systems, including diabetes, sleep apnea, and hypertension. Current weight management includes a 1200 calorie nutrition plan, with adherence 90% of the time. No exercise due to foot issues. Weight loss plateau noted. GLP-1 agonists discussed as a treatment option, with potential benefits for appetite control and weight loss. Risks include gastrointestinal side effects such as nausea and constipation. Emphasized the importance of dietary modifications to avoid side effects. Discussed the chronic nature of obesity and the need for long-term management. Explained that 80% of patients may require lifelong use of GLP-1 agonists. Discussed the potential for weight regain if medication is discontinued abruptly. - Initiated Zepbound (GLP-1 agonist) pending therapist, occupational. - Provided handout on GLP-1 agonist administration and common side effects. - Instructed on injection technique and site rotation. - Advised on dietary modifications to avoid gastrointestinal side effects. - Scheduled follow-up in three weeks to assess response to medication and adjust dosage if necessary.          Objective   Physical  Exam:  Blood pressure 139/88, pulse  77, temperature 97.6 F (36.4 C), height 5' 2 (1.575 m), weight 223 lb (101.2 kg), last menstrual period 11/26/2011, SpO2 99%. Body mass index is 40.79 kg/m.  General: She is overweight, cooperative, alert, well developed, and in no acute distress. PSYCH: Has normal mood, affect and thought process.   HEENT: EOMI, sclerae are anicteric. Lungs: Normal breathing effort, no conversational dyspnea. Extremities: No edema.  Neurologic: No gross sensory or motor deficits. No tremors or fasciculations noted.    Diagnostic Data Reviewed:  BMET    Component Value Date/Time   NA 145 (H) 07/26/2024 1022   K 4.1 07/26/2024 1022   CL 105 07/26/2024 1022   CO2 25 07/26/2024 1022   GLUCOSE 86 07/26/2024 1022   GLUCOSE 121 (H) 06/29/2024 1737   BUN 15 07/26/2024 1022   CREATININE 0.94 07/26/2024 1022   CREATININE 0.82 10/24/2023 1124   CALCIUM  9.1 07/26/2024 1022   GFRNONAA >60 06/29/2024 1707   GFRNONAA 56 (L) 01/10/2021 0852   GFRAA 65 01/10/2021 0852   Lab Results  Component Value Date   HGBA1C 5.9 (H) 07/26/2024   HGBA1C 5.9 (H) 03/30/2015   Lab Results  Component Value Date   INSULIN  16.6 07/26/2024   Lab Results  Component Value Date   TSH 0.95 01/22/2023   CBC    Component Value Date/Time   WBC 7.8 06/29/2024 1707   RBC 3.87 06/29/2024 1707   HGB 11.9 (L) 06/29/2024 1737   HCT 35.0 (L) 06/29/2024 1737   PLT 254 06/29/2024 1707   MCV 94.1 06/29/2024 1707   MCV 97.5 (A) 11/28/2013 1334   MCH 31.0 06/29/2024 1707   MCHC 33.0 06/29/2024 1707   RDW 12.0 06/29/2024 1707   Iron Studies    Component Value Date/Time   IRON 88 07/26/2024 1022   TIBC 268 07/26/2024 1022   FERRITIN 172 (H) 07/26/2024 1022   IRONPCTSAT 33 07/26/2024 1022   IRONPCTSAT 38 01/10/2021 0852   Lipid Panel     Component Value Date/Time   CHOL 175 07/26/2024 1022   TRIG 89 07/26/2024 1022   HDL 88 07/26/2024 1022   CHOLHDL 2.2 10/24/2023 1124   VLDL 21 03/30/2015 0926   LDLCALC 71  07/26/2024 1022   LDLCALC 92 10/24/2023 1124   Hepatic Function Panel     Component Value Date/Time   PROT 6.2 07/26/2024 1022   ALBUMIN 4.4 07/26/2024 1022   AST 16 07/26/2024 1022   ALT 19 07/26/2024 1022   ALKPHOS 77 07/26/2024 1022   BILITOT 0.7 07/26/2024 1022   BILIDIR 0.2 10/08/2017 1144   IBILI 0.8 10/08/2017 1144      Component Value Date/Time   TSH 0.95 01/22/2023 1152   Nutritional Lab Results  Component Value Date   VD25OH 68.1 07/26/2024   VD25OH 80.96 06/02/2023   VD25OH 91 01/22/2023    Medications: Outpatient Encounter Medications as of 10/04/2024  Medication Sig   albuterol  (VENTOLIN  HFA) 108 (90 Base) MCG/ACT inhaler Inhale 2 puffs into the lungs every 6 (six) hours as needed for wheezing or shortness of breath.   amLODipine  (NORVASC ) 5 MG tablet Take 1 tablet (5 mg total) by mouth daily.   aspirin  EC 81 MG tablet Take 1 tablet (81 mg total) by mouth daily. Swallow whole.   azelastine  (ASTELIN ) 0.1 % nasal spray Place 1 spray into both nostrils 2 (two) times daily. Use in each nostril as directed   budesonide  (PULMICORT ) 0.5 MG/2ML nebulizer solution  Take 2 mLs (0.5 mg total) by nebulization daily as needed.   buPROPion  (WELLBUTRIN  XL) 300 MG 24 hr tablet Take 300 mg by mouth daily.   cetirizine  (ZYRTEC  ALLERGY ) 10 MG tablet Take 1 tablet (10 mg total) by mouth daily.   EPINEPHrine  0.3 mg/0.3 mL IJ SOAJ injection See admin instructions.   FLUoxetine  (PROZAC ) 20 MG capsule Take 60 mg by mouth daily.   fluticasone  (FLONASE) 50 MCG/ACT nasal spray Place 1 spray into both nostrils daily as needed for allergies or rhinitis.   levocetirizine (XYZAL) 5 MG tablet Take 5 mg by mouth daily.   lisinopril  (ZESTRIL ) 20 MG tablet Take 20 mg by mouth daily.   metFORMIN  (GLUCOPHAGE -XR) 500 MG 24 hr tablet Take 1 tablet (500 mg total) by mouth daily with breakfast.   methylphenidate  (RITALIN ) 10 MG tablet Take 10 mg by mouth 2 (two) times daily.   montelukast  (SINGULAIR )  10 MG tablet Take 10 mg by mouth at bedtime.   NON FORMULARY Inject 1 Dose into the skin once a week. Allergy  shots from [facility]  at [place]. Patient not sure about the name of medicine   pantoprazole  (PROTONIX ) 40 MG tablet TAKE 1 TABLET BY MOUTH DAILY TO PREVENT INDIGESTION AND HEARTBURN   rosuvastatin  (CRESTOR ) 40 MG tablet Take  1 tablet Daily for Cholesterol            \    TAKE  BY MOUTH   SYMBICORT 160-4.5 MCG/ACT inhaler Inhale 2 puffs into the lungs 2 (two) times daily.   traZODone  (DESYREL ) 100 MG tablet Take 1 tablet (100 mg total) by mouth at bedtime.   VITAMIN D  PO Take 10,000 Units by mouth daily.   [DISCONTINUED] mupirocin  ointment (BACTROBAN ) 2 % Apply 1 Application topically 2 (two) times daily.   Facility-Administered Encounter Medications as of 10/04/2024  Medication   ipratropium-albuterol  (DUONEB) 0.5-2.5 (3) MG/3ML nebulizer solution 3 mL     Follow-Up   No follow-ups on file.SABRA She was informed of the importance of frequent follow up visits to maximize her success with intensive lifestyle modifications for her multiple health conditions.  Attestation Statement   Reviewed by clinician on day of visit: allergies, medications, problem list, medical history, surgical history, family history, social history, and previous encounter notes.     Lucas Parker, MD

## 2024-10-05 ENCOUNTER — Telehealth (INDEPENDENT_AMBULATORY_CARE_PROVIDER_SITE_OTHER): Payer: Self-pay

## 2024-10-05 NOTE — Assessment & Plan Note (Signed)
 Chronic condition affecting multiple organ systems, including diabetes, sleep apnea, and hypertension. Current weight management includes a 1200 calorie nutrition plan, with adherence 90% of the time. No exercise due to foot issues. Weight loss plateau noted. GLP-1 agonists discussed as a treatment option, with potential benefits for appetite control and weight loss. Risks include gastrointestinal side effects such as nausea and constipation. Emphasized the importance of dietary modifications to avoid side effects. Discussed the chronic nature of obesity and the need for long-term management. Explained that 80% of patients may require lifelong use of GLP-1 agonists. Discussed the potential for weight regain if medication is discontinued abruptly. - Initiated Zepbound (GLP-1 agonist) pending therapist, occupational. - Provided handout on GLP-1 agonist administration and common side effects. - Instructed on injection technique and site rotation. - Advised on dietary modifications to avoid gastrointestinal side effects. - Scheduled follow-up in three weeks to assess response to medication and adjust dosage if necessary.

## 2024-10-05 NOTE — Telephone Encounter (Signed)
 PA started - Zepbound 2.5 mg (OSA, Obesity, Prediabetes) Medicare Ins.

## 2024-10-06 ENCOUNTER — Encounter (INDEPENDENT_AMBULATORY_CARE_PROVIDER_SITE_OTHER): Payer: Self-pay

## 2024-10-06 ENCOUNTER — Telehealth (INDEPENDENT_AMBULATORY_CARE_PROVIDER_SITE_OTHER): Payer: Self-pay | Admitting: *Deleted

## 2024-10-06 NOTE — Telephone Encounter (Signed)
Zepbound approved- patient notified

## 2024-10-06 NOTE — Telephone Encounter (Signed)
 Message from Plan Your request has been approved. Authorization Expiration Date: November 03, 2025. Patient has been updated.

## 2024-10-11 NOTE — Telephone Encounter (Signed)
 Good Morning Dr. Lowery,  Please see patient's message above in regards to surgery - she is having hand surgery on 10/27/24, and then is scheduled for a breast reduction with you on 12/08/24 (6 weeks exactly from the hand surgery). Would you be okay with proceeding with the breast reduction or would you like her to wait a little bit longer after the hand surgery?   Thank you, Estefana

## 2024-10-20 ENCOUNTER — Telehealth (HOSPITAL_BASED_OUTPATIENT_CLINIC_OR_DEPARTMENT_OTHER): Payer: Self-pay

## 2024-10-20 NOTE — Telephone Encounter (Signed)
° °  Pre-operative Risk Assessment    Patient Name: Haley White  DOB: 1956-06-03 MRN: [id number]   Date of last office visit: 08/18/24 with [facility]  Date of next office visit: NA  Request for Surgical Clearance    Procedure:  Left thumb carpometacarpal arthroplasty and tendon transfer  Date of Surgery:  Clearance 11/15/24                                 Surgeon:  Dr. Shari Socks Group or Practice Name:  [facility] Phone number:  [phone] Fax number:  [phone]   Type of Clearance Requested:   - Medical  - Pharmacy:  Hold Aspirin  not indicated   Type of Anesthesia:  block and IV Sedation    Additional requests/questions:    Bonney Augustin JONETTA Delores   10/20/2024, 4:45 PM

## 2024-10-21 NOTE — Telephone Encounter (Signed)
° °  Name: Haley White  DOB: 12/13/1955  MRN: [id number]   Primary Cardiologist: Vina Gull, MD  Chart reviewed as part of pre-operative protocol coverage. CHLOEANN ALFRED was last seen on 08/18/2024 by Mercy Hails, PA-C.  She was doing well at that time.   Therefore, based on ACC/AHA guidelines, the patient would be an acceptable risk for the planned procedure without further cardiovascular testing.   Ideally aspirin  should be continued without interruption, however if the bleeding risk is too great, aspirin  may be held for 5-7 days prior to surgery. Please resume aspirin  post operatively when it is felt to be safe from a bleeding standpoint.    I will route this recommendation to the requesting party via Epic fax function and remove from pre-op pool. Please call with questions.  Barnie Hila, NP 10/21/2024, 10:32 AM

## 2024-10-27 ENCOUNTER — Ambulatory Visit (HOSPITAL_BASED_OUTPATIENT_CLINIC_OR_DEPARTMENT_OTHER): Admit: 2024-10-27 | Admitting: Orthopedic Surgery

## 2024-10-27 ENCOUNTER — Encounter (HOSPITAL_BASED_OUTPATIENT_CLINIC_OR_DEPARTMENT_OTHER): Payer: Self-pay

## 2024-10-27 SURGERY — ARTHROPLASTY, FINGER
Anesthesia: Regional | Site: Finger | Laterality: Left

## 2024-11-02 ENCOUNTER — Encounter (INDEPENDENT_AMBULATORY_CARE_PROVIDER_SITE_OTHER): Payer: Self-pay | Admitting: Internal Medicine

## 2024-11-02 ENCOUNTER — Ambulatory Visit (INDEPENDENT_AMBULATORY_CARE_PROVIDER_SITE_OTHER): Admitting: Internal Medicine

## 2024-11-02 VITALS — BP 126/76 | HR 86 | Temp 97.8°F | Ht 62.0 in | Wt 220.0 lb

## 2024-11-02 DIAGNOSIS — R7303 Prediabetes: Secondary | ICD-10-CM

## 2024-11-02 DIAGNOSIS — Z6841 Body Mass Index (BMI) 40.0 and over, adult: Secondary | ICD-10-CM

## 2024-11-02 DIAGNOSIS — G4733 Obstructive sleep apnea (adult) (pediatric): Secondary | ICD-10-CM

## 2024-11-02 DIAGNOSIS — N62 Hypertrophy of breast: Secondary | ICD-10-CM | POA: Diagnosis not present

## 2024-11-02 DIAGNOSIS — I10 Essential (primary) hypertension: Secondary | ICD-10-CM

## 2024-11-02 DIAGNOSIS — E66813 Obesity, class 3: Secondary | ICD-10-CM | POA: Diagnosis not present

## 2024-11-02 NOTE — Assessment & Plan Note (Signed)
 Currently on metformin  she will soon be restarting Zepbound  in February.  We will hold starting medication because of surgeries.  We may consider maintaining on Zepbound  for pharmacoprevention.

## 2024-11-02 NOTE — Assessment & Plan Note (Signed)
 Vitals:   11/02/24 0800  BP: 126/76    Blood pressure is at goal for age and risk category.  On amlodipine  without adverse effects.  Most recent renal parameters reviewed which showed stable electrolytes and kidney function.  Continue with weight loss therapy. Losing 10% may improve blood pressure control. Monitor for symptoms of orthostasis while losing weight. Continue current regimen and home monitoring for a goal blood pressure of < 120/80.

## 2024-11-02 NOTE — Progress Notes (Signed)
 "  Office: [phone]  /  Fax: [phone]  Weight Summary and Body Composition Analysis (BIA)  Vitals Temp: 97.8 F (36.6 C) BP: 126/76 Pulse Rate: 86 SpO2: 93 %   Anthropometric Measurements Height: 5' 2 (1.575 m) Weight: 220 lb (99.8 kg) BMI (Calculated): 40.23 Weight at Last Visit: 223 lb Weight Lost Since Last Visit: 3 lb Weight Gained Since Last Visit: 0 Starting Weight: 235 lb Total Weight Loss (lbs): 15 lb (6.804 kg) Peak Weight: 245 lb   Body Composition  Body Fat %: 48.6 % Fat Mass (lbs): 107.4 lbs Muscle Mass (lbs): 107.6 lbs Total Body Water (lbs): 78.6 lbs Visceral Fat Rating : 16    RMR: [id number]  Today's Visit #: 5  Starting Date: 07/26/24   Subjective   Chief Complaint: Obesity  Interval History  Discussed the use of AI scribe software for clinical note transcription with the patient, who gave verbal consent to proceed.  History of Present Illness Haley White is a 67 year old female with prediabetes, sleep apnea, and hypertension who presents for medical weight management.  She is actively engaged in medical weight management, adhering to a calorie-reduced meal plan targeting 1200 calories approximately 90% of the time. Since her last visit, she has lost three pounds, totaling a weight loss of fifteen pounds. She participates in sitting exercises three to four days a week.  She has been prescribed Zepbound  last office visit but due to hesitation has not started medication.  She has prediabetes and is currently taking metformin .  She has a history of sleep apnea, which is associated with her weight. There is no mention of current treatment for sleep apnea.  She has hypertension, which is also related to her weight.  She has a history of blood clots and asthma, which are mentioned as conditions affected by her weight. There is no detailed discussion of current symptoms or treatments for these conditions.  She experiences acid reflux and  manages her symptoms by avoiding spicy foods and reducing fried food intake.  She is concerned about the side effects of a new medication, Zepbound , which she has not yet started due to potential side effects and cost, partially covered by insurance.     Challenges affecting patient progress: orthopedic problems, medical conditions or chronic pain affecting mobility and medical comorbidities.    Pharmacotherapy for weight management: She is currently taking Zepbound  has not started medication.   Assessment and Plan   Treatment Plan For Obesity:  Recommended Dietary Goals  Haley White is currently in the action stage of change. As such, her goal is to continue weight management plan. She has agreed to: continue current plan  Behavioral Health and Counseling  We discussed the following behavioral modification strategies today: continue to work on maintaining a reduced calorie state, getting the recommended amount of protein, incorporating whole foods, making healthy choices, staying well hydrated and practicing mindfulness when eating. and increase protein intake, fibrous foods (25 grams per day for women, 30 grams for men) and water to improve satiety and decrease hunger signals. .  Additional education and resources provided today: None  Recommended Physical Activity Goals  Haley White has been advised to work up to 150 minutes of moderate intensity aerobic activity a week and strengthening exercises 2-3 times per week for cardiovascular health, weight loss maintenance and preservation of muscle mass.  She has agreed to :  Think about enjoyable ways to increase daily physical activity and overcoming barriers to exercise, Increase physical activity in  their day and reduce sedentary time (increase NEAT)., Increase volume of physical activity to a goal of 240 minutes a week, and Combine aerobic and strengthening exercises for efficiency and improved cardiometabolic health.  Medical Interventions and  Pharmacotherapy  We discussed various medication options to help Haley White with her weight loss efforts and we both agreed to : Start Zepbound  and mid February as she is having back-to-back surgeries during the month of January and beginning of February.  We reviewed use of pen device which she brought with her today.  Associated Conditions Impacted by Obesity Treatment  Assessment & Plan Primary hypertension Vitals:   11/02/24 0800  BP: 126/76    Blood pressure is at goal for age and risk category.  On amlodipine  without adverse effects.  Most recent renal parameters reviewed which showed stable electrolytes and kidney function.  Continue with weight loss therapy. Losing 10% may improve blood pressure control. Monitor for symptoms of orthostasis while losing weight. Continue current regimen and home monitoring for a goal blood pressure of < 120/80.   OSA on CPAP On CPAP with reported good compliance. Continue PAP therapy. Losing 15% or more of body weight may improve AHI.  She benefits from GLP-1 aided weight loss  Prediabetes Currently on metformin  she will soon be restarting Zepbound  in February.  We will hold starting medication because of surgeries.  We may consider maintaining on Zepbound  for pharmacoprevention. Symptomatic mammary hypertrophy She will be undergoing breast reduction surgery in February Class 3 severe obesity with serious comorbidity and body mass index (BMI) of 40.0 to 44.9 in adult, unspecified obesity type (HCC) Management is ongoing with a focus on weight loss for health improvement. She has lost 15 pounds since the last visit, following a calorie-reduced meal plan 90% of the time and exercising 3-4 days a week. Discussed starting Zepbound  (GLP-1 receptor agonist) for weight management and associated conditions. Benefits include weight loss, reduced cardiovascular risk, and improved management of weight-related conditions. Potential side effects include nausea, minimized  by dietary adjustments. Emphasized the importance of maintaining a healthy lifestyle alongside medication. Discussed the need to pause medication before upcoming surgeries and resume post-recovery. - Will start Zepbound  after breast reduction surgery. - Maintain calorie-reduced meal plan and exercise regimen. - Avoid ultra-processed foods and focus on whole foods. - Monitor for side effects and adjust medication dose as needed.         Objective   Physical Exam:  Blood pressure 126/76, pulse 86, temperature 97.8 F (36.6 C), height 5' 2 (1.575 m), weight 220 lb (99.8 kg), last menstrual period 11/26/2011, SpO2 93%. Body mass index is 40.24 kg/m.  General: She is overweight, cooperative, alert, well developed, and in no acute distress. PSYCH: Has normal mood, affect and thought process.   HEENT: EOMI, sclerae are anicteric. Lungs: Normal breathing effort, no conversational dyspnea. Extremities: No edema.  Neurologic: No gross sensory or motor deficits. No tremors or fasciculations noted.    Diagnostic Data Reviewed:  BMET    Component Value Date/Time   NA 145 (H) 07/26/2024 1022   K 4.1 07/26/2024 1022   CL 105 07/26/2024 1022   CO2 25 07/26/2024 1022   GLUCOSE 86 07/26/2024 1022   GLUCOSE 121 (H) 06/29/2024 1737   BUN 15 07/26/2024 1022   CREATININE 0.94 07/26/2024 1022   CREATININE 0.82 10/24/2023 1124   CALCIUM  9.1 07/26/2024 1022   GFRNONAA >60 06/29/2024 1707   GFRNONAA 56 (L) 01/10/2021 0852   GFRAA 65 01/10/2021 9147  Lab Results  Component Value Date   HGBA1C 5.9 (H) 07/26/2024   HGBA1C 5.9 (H) 03/30/2015   Lab Results  Component Value Date   INSULIN  16.6 07/26/2024   Lab Results  Component Value Date   TSH 0.95 01/22/2023   CBC    Component Value Date/Time   WBC 7.8 06/29/2024 1707   RBC 3.87 06/29/2024 1707   HGB 11.9 (L) 06/29/2024 1737   HCT 35.0 (L) 06/29/2024 1737   PLT 254 06/29/2024 1707   MCV 94.1 06/29/2024 1707   MCV 97.5 (A)  11/28/2013 1334   MCH 31.0 06/29/2024 1707   MCHC 33.0 06/29/2024 1707   RDW 12.0 06/29/2024 1707   Iron Studies    Component Value Date/Time   IRON 88 07/26/2024 1022   TIBC 268 07/26/2024 1022   FERRITIN 172 (H) 07/26/2024 1022   IRONPCTSAT 33 07/26/2024 1022   IRONPCTSAT 38 01/10/2021 0852   Lipid Panel     Component Value Date/Time   CHOL 175 07/26/2024 1022   TRIG 89 07/26/2024 1022   HDL 88 07/26/2024 1022   CHOLHDL 2.2 10/24/2023 1124   VLDL 21 03/30/2015 0926   LDLCALC 71 07/26/2024 1022   LDLCALC 92 10/24/2023 1124   Hepatic Function Panel     Component Value Date/Time   PROT 6.2 07/26/2024 1022   ALBUMIN 4.4 07/26/2024 1022   AST 16 07/26/2024 1022   ALT 19 07/26/2024 1022   ALKPHOS 77 07/26/2024 1022   BILITOT 0.7 07/26/2024 1022   BILIDIR 0.2 10/08/2017 1144   IBILI 0.8 10/08/2017 1144      Component Value Date/Time   TSH 0.95 01/22/2023 1152   Nutritional Lab Results  Component Value Date   VD25OH 68.1 07/26/2024   VD25OH 80.96 06/02/2023   VD25OH 91 01/22/2023    Medications: Outpatient Encounter Medications as of 11/02/2024  Medication Sig   albuterol  (VENTOLIN  HFA) 108 (90 Base) MCG/ACT inhaler Inhale 2 puffs into the lungs every 6 (six) hours as needed for wheezing or shortness of breath.   amLODipine  (NORVASC ) 5 MG tablet Take 1 tablet (5 mg total) by mouth daily.   aspirin  EC 81 MG tablet Take 1 tablet (81 mg total) by mouth daily. Swallow whole.   azelastine  (ASTELIN ) 0.1 % nasal spray Place 1 spray into both nostrils 2 (two) times daily. Use in each nostril as directed   budesonide  (PULMICORT ) 0.5 MG/2ML nebulizer solution Take 2 mLs (0.5 mg total) by nebulization daily as needed.   buPROPion  (WELLBUTRIN  XL) 300 MG 24 hr tablet Take 300 mg by mouth daily.   cetirizine  (ZYRTEC  ALLERGY ) 10 MG tablet Take 1 tablet (10 mg total) by mouth daily.   EPINEPHrine  0.3 mg/0.3 mL IJ SOAJ injection See admin instructions.   FLUoxetine  (PROZAC ) 20 MG  capsule Take 60 mg by mouth daily.   fluticasone  (FLONASE) 50 MCG/ACT nasal spray Place 1 spray into both nostrils daily as needed for allergies or rhinitis.   levocetirizine (XYZAL) 5 MG tablet Take 5 mg by mouth daily.   lisinopril  (ZESTRIL ) 20 MG tablet Take 20 mg by mouth daily.   metFORMIN  (GLUCOPHAGE -XR) 500 MG 24 hr tablet Take 1 tablet (500 mg total) by mouth daily with breakfast.   methylphenidate  (RITALIN ) 10 MG tablet Take 10 mg by mouth 2 (two) times daily.   montelukast  (SINGULAIR ) 10 MG tablet Take 10 mg by mouth at bedtime.   NON FORMULARY Inject 1 Dose into the skin once a week. Allergy  shots from [facility]  at [place]. Patient not sure about the name of medicine   pantoprazole  (PROTONIX ) 40 MG tablet TAKE 1 TABLET BY MOUTH DAILY TO PREVENT INDIGESTION AND HEARTBURN   rosuvastatin  (CRESTOR ) 40 MG tablet Take  1 tablet Daily for Cholesterol            \    TAKE  BY MOUTH   SYMBICORT 160-4.5 MCG/ACT inhaler Inhale 2 puffs into the lungs 2 (two) times daily.   tirzepatide  (ZEPBOUND ) 2.5 MG/0.5ML Pen Inject 2.5 mg into the skin once a week.   traZODone  (DESYREL ) 100 MG tablet Take 1 tablet (100 mg total) by mouth at bedtime.   VITAMIN D  PO Take 10,000 Units by mouth daily.   Facility-Administered Encounter Medications as of 11/02/2024  Medication   ipratropium-albuterol  (DUONEB) 0.5-2.5 (3) MG/3ML nebulizer solution 3 mL     Follow-Up   Return in about 7 weeks (around 12/21/2024) for For Weight Mangement with Dr. Francyne.SABRA She was informed of the importance of frequent follow up visits to maximize her success with intensive lifestyle modifications for her multiple health conditions.  Attestation Statement   Reviewed by clinician on day of visit: allergies, medications, problem list, medical history, surgical history, family history, social history, and previous encounter notes.     Lucas Francyne, MD  "

## 2024-11-02 NOTE — Assessment & Plan Note (Signed)
 On CPAP with reported good compliance. Continue PAP therapy. Losing 15% or more of body weight may improve AHI.  She benefits from GLP-1 aided weight loss

## 2024-11-02 NOTE — Assessment & Plan Note (Signed)
 She will be undergoing breast reduction surgery in February

## 2024-11-02 NOTE — Assessment & Plan Note (Signed)
 Management is ongoing with a focus on weight loss for health improvement. She has lost 15 pounds since the last visit, following a calorie-reduced meal plan 90% of the time and exercising 3-4 days a week. Discussed starting Zepbound  (GLP-1 receptor agonist) for weight management and associated conditions. Benefits include weight loss, reduced cardiovascular risk, and improved management of weight-related conditions. Potential side effects include nausea, minimized by dietary adjustments. Emphasized the importance of maintaining a healthy lifestyle alongside medication. Discussed the need to pause medication before upcoming surgeries and resume post-recovery. - Will start Zepbound  after breast reduction surgery. - Maintain calorie-reduced meal plan and exercise regimen. - Avoid ultra-processed foods and focus on whole foods. - Monitor for side effects and adjust medication dose as needed.

## 2024-11-05 ENCOUNTER — Ambulatory Visit (INDEPENDENT_AMBULATORY_CARE_PROVIDER_SITE_OTHER)

## 2024-11-05 ENCOUNTER — Ambulatory Visit (INDEPENDENT_AMBULATORY_CARE_PROVIDER_SITE_OTHER): Admitting: Podiatry

## 2024-11-05 VITALS — Ht 62.0 in | Wt 220.0 lb

## 2024-11-05 DIAGNOSIS — M7752 Other enthesopathy of left foot: Secondary | ICD-10-CM

## 2024-11-05 DIAGNOSIS — M21621 Bunionette of right foot: Secondary | ICD-10-CM | POA: Diagnosis not present

## 2024-11-05 DIAGNOSIS — M722 Plantar fascial fibromatosis: Secondary | ICD-10-CM | POA: Diagnosis not present

## 2024-11-05 NOTE — Progress Notes (Signed)
 Subjective: Chief Complaint  Patient presents with   Post-op Follow-up    RM 11 POV # 4 DOS 10/1 25 LEFT FOOT EXCISION OF SOFT TISSUE MASS. Pt states no pain or discomfort, just mild itching sensation. Pt states right foot pain by tailor's bunion area.   69 year old female presents the office for the above concerns.  She states from a surgical standpoint she has been doing well she has no pain associated with this.  Her concern today is that she is getting tightness to the arch of the left foot, pain to the left ankle as well as a tailor's bunion on the right foot.  She does not report any recent injuries or changes and no changes in activity.  She has a history of left ankle ORIF and she has had significant syndesmotic screws removed but otherwise hardware is intact.  Most of her pain today is along the anterior lateral aspect the left ankle that she points to.  Objective: AAO x3, NAD DP/PT pulses palpable bilaterally, CRT less than 3 seconds Left foot: Incision well-healed without any evidence of dehiscence or infection.  There is no pain on surgical site.  She gets more of a tightness sensation along the arch of the left foot when she dorsiflexes her foot but there is no specific area of tenderness.  There is tenderness along the anterior lateral aspect the left ankle joint.  There is no pain with ankle range of motion.  Palpable hardware is noted along the fibula but there is no significant pain to this area. Right foot: Tenderness localized on the fifth MPJ on area of tailor's bunion.  No erythema or warmth.  There is no specific area of pinpoint tenderness otherwise. No pain with calf compression, swelling, warmth, erythema  Assessment: Left ankle capsulitis; plantar fasciitis left; tailor's bunion right  Plan: Radiology -X-rays obtained reviewed of left ankle, right foot.  The right foot tailor's bunion is present with there is no evidence of acute fracture.  On the left ankle hardware  intact but any evidence of acute fracture.  There is an minimal joint space narrowing.  Status post left foot surgery, exostosis -In regards to the surgical site this is healing well.  And discharged over the postoperative course and she agrees with this plan.  Left ankle capsulitis - She was to proceed with steroid injection.  Verbal consent obtained.  Cleaned the skin with Betadine, alcohol.  A mixture of 1 cc Kenalog  10, 0.5 cc of Marcaine  plain, 0.5 cc of lidocaine  plain was infiltrated into the area of maximal tenderness on the anterior lateral aspect of the ankle joint without complications.  She tolerated well. -Ankle brace if needed -Supportive shoe gear  Left foot plantar fasciitis -Discussed traction, good arch support on a regular basis.  Discussed topical medication that she can use.  Right foot tailor's bunion -Consider steroid injection at future if needed.  Dispensed offloading pads.  Discussed she is to avoid excess pressure  No follow-ups on file.  Donnice JONELLE Fees DPM

## 2024-11-15 ENCOUNTER — Encounter (HOSPITAL_BASED_OUTPATIENT_CLINIC_OR_DEPARTMENT_OTHER): Payer: Self-pay | Admitting: Physical Therapy

## 2024-11-15 ENCOUNTER — Encounter (HOSPITAL_COMMUNITY): Payer: Self-pay | Admitting: Emergency Medicine

## 2024-11-15 ENCOUNTER — Other Ambulatory Visit: Payer: Self-pay

## 2024-11-15 ENCOUNTER — Emergency Department (HOSPITAL_COMMUNITY)
Admission: EM | Admit: 2024-11-15 | Discharge: 2024-11-16 | Disposition: A | Attending: Emergency Medicine | Admitting: Emergency Medicine

## 2024-11-15 DIAGNOSIS — G8918 Other acute postprocedural pain: Secondary | ICD-10-CM | POA: Insufficient documentation

## 2024-11-15 DIAGNOSIS — M25532 Pain in left wrist: Secondary | ICD-10-CM | POA: Insufficient documentation

## 2024-11-15 DIAGNOSIS — R000 Tachycardia, unspecified: Secondary | ICD-10-CM | POA: Diagnosis not present

## 2024-11-15 LAB — CBC
HCT: 39 % (ref 36.0–46.0)
Hemoglobin: 13.3 g/dL (ref 12.0–15.0)
MCH: 32 pg (ref 26.0–34.0)
MCHC: 34.1 g/dL (ref 30.0–36.0)
MCV: 94 fL (ref 80.0–100.0)
Platelets: 291 K/uL (ref 150–400)
RBC: 4.15 MIL/uL (ref 3.87–5.11)
RDW: 12.5 % (ref 11.5–15.5)
WBC: 12.3 K/uL — ABNORMAL HIGH (ref 4.0–10.5)
nRBC: 0 % (ref 0.0–0.2)

## 2024-11-15 LAB — BASIC METABOLIC PANEL WITH GFR
Anion gap: 13 (ref 5–15)
BUN: 16 mg/dL (ref 8–23)
CO2: 21 mmol/L — ABNORMAL LOW (ref 22–32)
Calcium: 9.7 mg/dL (ref 8.9–10.3)
Chloride: 105 mmol/L (ref 98–111)
Creatinine, Ser: 0.94 mg/dL (ref 0.44–1.00)
GFR, Estimated: >60 mL/min
Glucose, Bld: 108 mg/dL — ABNORMAL HIGH (ref 70–99)
Potassium: 4.1 mmol/L (ref 3.5–5.1)
Sodium: 140 mmol/L (ref 135–145)

## 2024-11-15 MED ORDER — FENTANYL CITRATE (PF) 50 MCG/ML IJ SOSY
100.0000 ug | PREFILLED_SYRINGE | Freq: Once | INTRAMUSCULAR | Status: AC
  Administered 2024-11-15: 100 ug via INTRAVENOUS
  Filled 2024-11-15: qty 2

## 2024-11-15 MED ORDER — HYDROMORPHONE HCL 1 MG/ML IJ SOLN
1.0000 mg | Freq: Once | INTRAMUSCULAR | Status: AC
  Administered 2024-11-15: 1 mg via INTRAVENOUS
  Filled 2024-11-15: qty 1

## 2024-11-15 MED ORDER — HYDROMORPHONE HCL 1 MG/ML IJ SOLN
0.5000 mg | Freq: Once | INTRAMUSCULAR | Status: AC
  Administered 2024-11-15: 0.5 mg via INTRAVENOUS
  Filled 2024-11-15: qty 1

## 2024-11-15 MED ORDER — KETOROLAC TROMETHAMINE 30 MG/ML IJ SOLN
30.0000 mg | Freq: Once | INTRAMUSCULAR | Status: AC
  Administered 2024-11-15: 30 mg via INTRAVENOUS
  Filled 2024-11-15: qty 1

## 2024-11-15 NOTE — ED Provider Notes (Signed)
 " [facility]PART[facility] AT [facility] Provider Note   CSN: [id number] Arrival date & time: 11/15/24  2008     Patient presents with: Post-op Problem   Haley White is a 69 y.o. female presenting to the ED complaining of pain in her left wrist.  The patient's daughter at the bedside reports that the patient had an elective wrist surgery performed earlier today by Dr. Prentice Pagan at [facility].  I am not able to review these records personally but I do see she had a heart care evaluation in December for preoperative clearance for the following procedure:  Procedure:  Left thumb carpometacarpal arthroplasty and tendon transfer   Her daughter reports that when the patient's nerve block wore off she was been screaming in pain.  She was given 2 doses of oxycodone  which she was prescribed at home with no relief.  She has been inconsolable due to the pain.  Her daughter reports that the patient has had a similar response in the past after a foot surgery.   HPI     Prior to Admission medications  Medication Sig Start Date End Date Taking? Authorizing Provider  albuterol  (VENTOLIN  HFA) 108 (90 Base) MCG/ACT inhaler Inhale 2 puffs into the lungs every 6 (six) hours as needed for wheezing or shortness of breath.    [provider]  amLODipine  (NORVASC ) 5 MG tablet Take 1 tablet (5 mg total) by mouth daily. 08/18/24   Duke, Jon Garre, PA  aspirin  EC 81 MG tablet Take 1 tablet (81 mg total) by mouth daily. Swallow whole. 07/01/24   Madie Jon Garre, PA  azelastine  (ASTELIN ) 0.1 % nasal spray Place 1 spray into both nostrils 2 (two) times daily. Use in each nostril as directed 02/11/24   Meade Verdon RAMAN, MD  budesonide  (PULMICORT ) 0.5 MG/2ML nebulizer solution Take 2 mLs (0.5 mg total) by nebulization daily as needed. 08/21/23   Cranford, Tonya, NP  buPROPion  (WELLBUTRIN  XL) 300 MG 24 hr tablet Take 300 mg by mouth daily.    [provider]   cetirizine  (ZYRTEC  ALLERGY ) 10 MG tablet Take 1 tablet (10 mg total) by mouth daily. 02/11/24   Desai, Nikita S, MD  EPINEPHrine  0.3 mg/0.3 mL IJ SOAJ injection See admin instructions. 02/08/20   [provider]  FLUoxetine  (PROZAC ) 20 MG capsule Take 60 mg by mouth daily.    [provider]  fluticasone  (FLONASE) 50 MCG/ACT nasal spray Place 1 spray into both nostrils daily as needed for allergies or rhinitis.    [provider]  levocetirizine (XYZAL) 5 MG tablet Take 5 mg by mouth daily. 09/14/24   [provider]  lisinopril  (ZESTRIL ) 20 MG tablet Take 20 mg by mouth daily.    [provider]  metFORMIN  (GLUCOPHAGE -XR) 500 MG 24 hr tablet Take 1 tablet (500 mg total) by mouth daily with breakfast. 08/30/24   Francyne Romano, MD  methylphenidate  (RITALIN ) 10 MG tablet Take 10 mg by mouth 2 (two) times daily. 01/16/18   [provider]  montelukast  (SINGULAIR ) 10 MG tablet Take 10 mg by mouth at bedtime.    [provider]  NON FORMULARY Inject 1 Dose into the skin once a week. Allergy  shots from [facility]  at [place]. Patient not sure about the name of medicine    [provider]  pantoprazole  (PROTONIX ) 40 MG tablet TAKE 1 TABLET BY MOUTH DAILY TO PREVENT INDIGESTION AND HEARTBURN 06/20/23   Wilkinson, Dana E, NP  rosuvastatin  (CRESTOR ) 40 MG tablet Take  1 tablet Daily for Cholesterol            \    TAKE  BY MOUTH 11/01/23   Tonita Fallow, MD  SYMBICORT 160-4.5 MCG/ACT inhaler Inhale 2 puffs into the lungs 2 (two) times daily. 01/16/18   [provider]  tirzepatide  (ZEPBOUND ) 2.5 MG/0.5ML Pen Inject 2.5 mg into the skin once a week. 10/04/24   Francyne Romano, MD  traZODone  (DESYREL ) 100 MG tablet Take 1 tablet (100 mg total) by mouth at bedtime. 08/10/24   Whitfield Raisin, NP  VITAMIN D  PO Take 10,000 Units by mouth daily.    [provider]    Allergies: Molds & smuts, Bactrim   [sulfamethoxazole -trimethoprim ], Biaxin [clarithromycin], Biaxin [clarithromycin], Prednisone , Prednisone , Serevent [salmeterol], Tequin [gatifloxacin], Tequin [gatifloxacin], and Serevent [salmeterol]    Review of Systems  Updated Vital Signs BP (!) 145/90   Pulse 75   Temp 99.1 F (37.3 C) (Oral)   Resp 18   LMP 11/26/2011   SpO2 97%   Physical Exam Constitutional:      General: She is in acute distress.  HENT:     Head: Normocephalic and atraumatic.  Eyes:     Conjunctiva/sclera: Conjunctivae normal.     Pupils: Pupils are equal, round, and reactive to light.  Cardiovascular:     Rate and Rhythm: Regular rhythm. Tachycardia present.  Pulmonary:     Effort: Pulmonary effort is normal. No respiratory distress.  Musculoskeletal:     Comments: Left arm in cast; patient cannot provide any movement of the fingers due to pain, exquisite tenderness with palpation anywhere near the left sling; will not tolerate any manipulation of the wrist or fingers; distal fingers do appear warm, brisk cap refills, well-perfused  Skin:    General: Skin is warm and dry.  Neurological:     General: No focal deficit present.     Mental Status: She is alert and oriented to person, place, and time. Mental status is at baseline.  Psychiatric:        Mood and Affect: Mood normal.        Behavior: Behavior normal.     (all labs ordered are listed, but only abnormal results are displayed) Labs Reviewed  BASIC METABOLIC PANEL WITH GFR - Abnormal; Notable for the following components:      Result Value   CO2 21 (*)    Glucose, Bld 108 (*)    All other components within normal limits  CBC - Abnormal; Notable for the following components:   WBC 12.3 (*)    All other components within normal limits    EKG: None  Radiology: No results found.   Procedures   Medications Ordered in the ED  fentaNYL  (SUBLIMAZE ) injection 100 mcg (100 mcg Intravenous Given 11/15/24 2054)  HYDROmorphone  (DILAUDID )  injection 1 mg (1 mg Intravenous Given 11/15/24 2203)  HYDROmorphone  (DILAUDID ) injection 0.5 mg (0.5 mg Intravenous Given 11/15/24 2321)  ketorolac  (TORADOL ) 30 MG/ML injection 30 mg (30 mg Intravenous Given 11/15/24 2321)    Clinical Course as of 11/16/24 0002  Mon Nov 15, 2024  2309 I spoke to [place] from [facility], who advised that the patient could be sent home with pain control if there was no immediate concern for compartment syndrome, and she could be seen in tomorrow by her surgeon.  The patient's pain did appear somewhat improved, though she is still quite tearful.  I explained to her and  her daughter the plan for discharge home with follow-up in the office tomorrow and they seem amenable to this plan. [MT]    Clinical Course User Index [MT] Maliya Marich, Donnice PARAS, MD                                 Medical Decision Making Amount and/or Complexity of Data Reviewed Labs: ordered.  Risk Prescription drug management.   Patient is here with acute postoperative pain several hours after procedure as her nerve block wore off.  I have ordered some IV pain medications as the patient appears inconsolable and I am unable to obtain much of a neurological assessment.  Her fingers do appear confused and I do not think that this is an ischemic issue of the distal fingertips.  There is no mechanism to raise concern for underlying fracture.  I do not suspect compartment syndrome.  For my private discussion with the patient's daughter at bedside, she appears of had a very similar response in the past to another surgery.  I suspect that she has a very low pain tolerance.  She has oxycodone  prescribed already at home.  IV pain medicine given for severe pain     Final diagnoses:  Post-operative pain    ED Discharge Orders     None          Deadrick Stidd, Donnice PARAS, MD 11/16/24 0002  "

## 2024-11-15 NOTE — ED Triage Notes (Signed)
 Pt arrives W/ [facility] w/ c/o severe left wrist pain that began at 4pm today. Reports she had surgery this morning on her wrist. Unsure what type of surgery.  Took Oxy at 4pm then at 6pm.  Pt arrives w/ severe pain, warm to touch and N/V.  VSS

## 2024-11-15 NOTE — Discharge Instructions (Addendum)
 Return to the ER if you have loss of feeling in your fingers, or your fingertips turn purple or blue or white, or you have redness swelling up the arm, or your entire arm swelling with pain.

## 2024-11-16 ENCOUNTER — Encounter: Admitting: Student

## 2024-11-16 NOTE — ED Notes (Signed)
 Re wrapped cast with gauze and coband

## 2024-11-18 ENCOUNTER — Encounter: Payer: Self-pay | Admitting: Student

## 2024-11-18 ENCOUNTER — Ambulatory Visit: Admitting: Student

## 2024-11-18 VITALS — BP 148/83 | HR 75 | Ht 63.0 in | Wt 222.6 lb

## 2024-11-18 DIAGNOSIS — N62 Hypertrophy of breast: Secondary | ICD-10-CM | POA: Diagnosis not present

## 2024-11-18 NOTE — Progress Notes (Signed)
 "    Patient ID: Haley White, female    DOB: 03-Jan-1956, 70 y.o.   MRN: [id number]  Chief Complaint  Patient presents with   Pre-op Exam      ICD-10-CM   1. Symptomatic mammary hypertrophy  N62     2. Macromastia  N62        History of Present Illness: Haley White is a 69 y.o.  female  with a history of macromastia.  She presents for preoperative evaluation for upcoming procedure, Bilateral Breast Reduction with liposuction, scheduled for 12/08/2024 with Dr.  Lowery  The patient has not had problems with anesthesia.  Patient reports she had a mammogram in August which was negative.  She denies any personal history of breast cancer.  She reports her mother had breast cancer.  Patient denies any recent cardiac issues.  She does report she takes aspirin .  Patient reports she is not a smoker.  Patient denies taking any birth control or hormone replacement.  She denies any history of greater than 3 miscarriages.  Patient does have personal history of blood clot.  She denies any family history of blood clots.  She denies any personal or family history of clotting diseases.  Patient does report she had surgery on her hand this past Monday.  She otherwise denies any recent traumas or infections.  She denies any history of stroke or heart attack.  She denies any history of Crohn's disease or ulcerative colitis.  She denies any history of COPD.  She does state that she has asthma which is well-controlled.  She denies any history of cancer.  She does report she has varicosities to her lower extremities.  She denies any recent fevers chills or changes in her health.  Patient reports that she has sleep apnea and is currently on CPAP.  She states that her sleep apnea is well-controlled with her CPAP machine.  Patient states she is unsure of what cup size she has currently.  She states that she would like to be around a C cup.  Discussed with patient that cup size cannot be guaranteed.  Patient expressed  understanding.  I asked the patient about the history of suicide attempt by drug ingestion in the chart.  She states that several years ago, she was on prednisone  from pain from pseudogout which did not help her mental health.  She states that at that time, she had familial issues and she had recently moved.  She states that she has been doing well since then. We discussed that surgery could be emotionally and mentally taxing and she expressed understanding to this. She states she regularly follows up with a therapist and feels that she has good support at home.    Summary of Previous Visit: Patient was seen for initial consult by Dr. Lowery on 05/31/2024.  At this visit, patient complained of upper back and neck pain due to her enlarged breasts.  On exam, her STN on the right was 32 cm and her STN on the left was 33 cm.  Her BMI was 44.3 kg/m.  She estimated amount of breast tissue to be removed at the time of surgery was 750-800 g bilaterally.  Patient was found to be a good candidate for breast reduction.  Physical therapy was prescribed for her.  Estimated excess breast tissue to be removed at time of surgery: 750-800 grams  Job: Retired  PMH Significant for: Hypertension,  aortic atherosclerosis, acute pulmonary embolism, asthma, OSA, anxiety, macromastia  Per chart review, clearance was received from patient's cardiologist [facility] remote hold.  Per cardiology note, she may proceed with breast reduction surgery at acceptable risk.  She may interrupt ASA as needed per surgeon.    Past Medical History: Allergies: Allergies[1]  Current Medications: Current Medications[2]  Past Medical Problems: Past Medical History:  Diagnosis Date   ADHD    Allergy     Anxiety    Arthritis    Asthma    Back pain    Cataract    Chest pain    Depression    Fibromyalgia    GERD (gastroesophageal reflux disease)    Gout    Hx pulmonary embolism    Hyperlipidemia    Hypertension    Joint  pain    Obese    Osteoarthritis    Pain in left ankle    Palpitations    Pre-diabetes    Sleep apnea    SOB (shortness of breath)    Vitamin D  deficiency     Past Surgical History: Past Surgical History:  Procedure Laterality Date   ANKLE SURGERY Left    APPENDECTOMY  1984   APPENDECTOMY     CATARACT EXTRACTION W/ INTRAOCULAR LENS  IMPLANT, BILATERAL  2014   CESAREAN SECTION  1984, 1987, 1994   X3   CESAREAN SECTION     x3   COLONOSCOPY  11/16/2013   w/Brodie    EXTERNAL FIXATION LEG Left 06/04/2023   Procedure: EXTERNAL FIXATION LEG;  Surgeon: Barton Drape, MD;  Location: [facility];  Service: Orthopedics;  Laterality: Left;   EYE SURGERY Bilateral    lasix   FOOT FRACTURE SURGERY Left    FOOT SURGERY Left 2002   FRACTURE SURGERY Left    left ankle   HARDWARE REMOVAL Left 12/03/2023   Procedure: HARDWARE REMOVAL OF SYNDEMOSIS;  Surgeon: Barton Drape, MD;  Location: [facility];  Service: Orthopedics;  Laterality: Left;   KNEE SURGERY Left 1998   ORIF ANKLE FRACTURE Left 06/04/2023   Procedure: OPEN REDUCTION INTERNAL FIXATION (ORIF) ANKLE FRACTURE vs EX FIX;  Surgeon: Barton Drape, MD;  Location: [facility];  Service: Orthopedics;  Laterality: Left;   ROTATOR CUFF REPAIR Right 2007   shoulder     torn rotator cuff   TUBAL LIGATION  1994   TUBAL LIGATION      Social History: Social History   Socioeconomic History   Marital status: Divorced    Spouse name: Not on file   Number of children: Not on file   Years of education: Not on file   Highest education level: Not on file  Occupational History   Occupation: Retired Barrister's Clerk  Tobacco Use   Smoking status: Former    Types: Cigarettes    Passive exposure: Never   Smokeless tobacco: Never  Vaping Use   Vaping status: Never Used  Substance and Sexual Activity   Alcohol use: Yes    Comment: occasionally   Drug use: Never    Comment: 06/03/2022-gummies   Sexual activity: Yes     Birth control/protection: Post-menopausal, Surgical  Other Topics Concern   Not on file  Social History Narrative   ** Merged History Encounter **       Social Drivers of Health   Tobacco Use: Medium Risk (11/18/2024)   Patient History    Smoking Tobacco Use: Former    Smokeless Tobacco Use: Never    Passive Exposure: Never  Physicist, Medical Strain: Not on [employer]  Insecurity: Patient Declined (06/03/2023)   Hunger Vital Sign    Worried About Running Out of Food in the Last Year: Patient declined    Ran Out of Food in the Last Year: Patient declined  Transportation Needs: No Transportation Needs (06/02/2023)   PRAPARE - Administrator, Civil Service (Medical): No    Lack of Transportation (Non-Medical): No  Physical Activity: Not on file  Stress: Not on file  Social Connections: Not on file  Intimate Partner Violence: Not At Risk (06/03/2023)   Humiliation, Afraid, Rape, and Kick questionnaire    Fear of Current or Ex-Partner: No    Emotionally Abused: No    Physically Abused: No    Sexually Abused: No  Depression (PHQ2-9): Low Risk (07/26/2024)   Depression (PHQ2-9)    PHQ-2 Score: 0  Alcohol Screen: Not on file  Housing: Low Risk (06/02/2023)   Housing    Last Housing Risk Score: 0  Utilities: Not At Risk (06/03/2023)   AHC Utilities    Threatened with loss of utilities: No  Health Literacy: Not on file    Family History: Family History  Problem Relation Age of Onset   Dementia Father    Heart disease Father    Hypertension Father    Hyperlipidemia Father    Deep vein thrombosis Father    Depression Father    Anxiety disorder Father    Anxiety disorder Sister    Colon cancer Neg Hx    Colon polyps Neg Hx    Esophageal cancer Neg Hx    Stomach cancer Neg Hx    Rectal cancer Neg Hx     Review of Systems: Denies any recent fevers or chills  Physical Exam: Vital Signs BP (!) 148/83 (BP Location: Left Arm, Patient Position: Sitting, Cuff Size:  Large)   Pulse 75   Ht 5' 3 (1.6 m)   Wt 222 lb 9.6 oz (101 kg)   LMP 11/26/2011   SpO2 98%   BMI 39.43 kg/m   Physical Exam  Constitutional:      General: Not in acute distress.    Appearance: Normal appearance. Not ill-appearing.  HENT:     Head: Normocephalic and atraumatic.  Eyes:     Pupils: Pupils are equal, round Neck:     Musculoskeletal: Normal range of motion.  Cardiovascular:     Rate and Rhythm: Normal rate Pulmonary:     Effort: Pulmonary effort is normal. No respiratory distress.  Musculoskeletal: Splint/dressings to the left wrist is noted Skin:    General: Skin is warm and dry.     Findings: No erythema or rash.  Neurological:     Mental Status: Alert and oriented to person, place, and time. Mental status is at baseline.  Psychiatric:        Mood and Affect: Mood normal.        Behavior: Behavior normal.    Assessment/Plan: The patient is scheduled for bilateral breast reduction with Dr. Lowery.  Risks, benefits, and alternatives of procedure discussed, questions answered and consent obtained.    Discussed with the patient that I will have to confirm with Dr. Lowery that we can proceed with surgery as scheduled with her most recent surgery.  Patient expressed understanding.  Smoking Status: Non-smoker; Counseling Given?  N/A Last Mammogram: 06/14/24; Results: BI-RADS Category 1 negative  Caprini Score: 10; Risk Factors include: Age, BMI greater than 25, personal history of thrombosis, varicose veins, recent surgery and length of planned surgery. Recommendation  for mechanical and possible pharmacological pharmacological prophylaxis. Encourage early ambulation.  Will discuss possibility of postoperative Lovenox  with Dr. Lowery.  Pictures obtained: @consult   Post-op Rx sent to pharmacy: Oxycodone , Zofran , Keflex    We will plan to have the patient hold aspirin , multivitamins, vitamins and supplements at least 1 week prior to surgery.  Instructed  the patient to hold lisinopril  and metformin  as well as Ritalin  the day of surgery.  Patient reports she has not started taking Zepbound  yet.  She expressed understanding as to what medications to hold.  Patient was provided with the breast reduction and General Surgical Risk consent document and Pain Medication Agreement prior to their appointment.  They had adequate time to read through the risk consent documents and Pain Medication Agreement. We also discussed them in person together during this preop appointment. All of their questions were answered to their satisfaction.  Recommended calling if they have any further questions.  Risk consent form and Pain Medication Agreement to be scanned into patient's chart.  The risk that can be encountered with breast reduction were discussed and include the following but not limited to these:  Breast asymmetry, fluid accumulation, firmness of the breast, inability to breast feed, loss of nipple or areola, skin loss, decrease or no nipple sensation, fat necrosis of the breast tissue, bleeding, infection, healing delay.  There are risks of anesthesia, changes to skin sensation and injury to nerves or blood vessels.  The muscle can be temporarily or permanently injured.  You may have an allergic reaction to tape, suture, glue, blood products which can result in skin discoloration, swelling, pain, skin lesions, poor healing.  Any of these can lead to the need for revisonal surgery or stage procedures.  A reduction has potential to interfere with diagnostic procedures.  Nipple or breast piercing can increase risks of infection.  This procedure is best done when the breast is fully developed.  Changes in the breast will continue to occur over time.  Pregnancy can alter the outcomes of previous breast reduction surgery, weight gain and weigh loss can also effect the long term appearance.   We discussed that patient's sleep apnea may put her at higher risk of complications  with anesthesia.  She expressed understanding.  Electronically signed by: Estefana FORBES Peck, PA-C 11/19/2024 4:31 PM      [1]  Allergies Allergen Reactions   Molds & Smuts Anaphylaxis   Bactrim  [Sulfamethoxazole -Trimethoprim ] Other (See Comments)    Thrush   Biaxin [Clarithromycin]     GI upset   Biaxin [Clarithromycin] Other (See Comments)    GI Upset   Prednisone      Was admitted for self harm when on prednisone  before   Prednisone  Other (See Comments)    Suicidal   Serevent [Salmeterol]     Palpitations   Tequin [Gatifloxacin]     GI upset. thrush   Tequin [Gatifloxacin] Other (See Comments)    GI upset, Thrush   Serevent [Salmeterol] Palpitations  [2]  Current Outpatient Medications:    albuterol  (VENTOLIN  HFA) 108 (90 Base) MCG/ACT inhaler, Inhale 2 puffs into the lungs every 6 (six) hours as needed for wheezing or shortness of breath., Disp: , Rfl:    amLODipine  (NORVASC ) 5 MG tablet, Take 1 tablet (5 mg total) by mouth daily., Disp: 90 tablet, Rfl: 3   aspirin  EC 81 MG tablet, Take 1 tablet (81 mg total) by mouth daily. Swallow whole., Disp: , Rfl:    budesonide  (PULMICORT ) 0.5 MG/2ML nebulizer solution, Take  2 mLs (0.5 mg total) by nebulization daily as needed., Disp: 2 mL, Rfl: 2   buPROPion  (WELLBUTRIN  XL) 300 MG 24 hr tablet, Take 300 mg by mouth daily., Disp: , Rfl:    cephALEXin  (KEFLEX ) 500 MG capsule, Take 1 capsule (500 mg total) by mouth 4 (four) times daily for 3 days., Disp: 12 capsule, Rfl: 0   cetirizine  (ZYRTEC  ALLERGY ) 10 MG tablet, Take 1 tablet (10 mg total) by mouth daily., Disp: 90 tablet, Rfl: 3   EPINEPHrine  0.3 mg/0.3 mL IJ SOAJ injection, See admin instructions., Disp: , Rfl:    FLUoxetine  (PROZAC ) 20 MG capsule, Take 60 mg by mouth daily., Disp: , Rfl:    fluticasone  (FLONASE) 50 MCG/ACT nasal spray, Place 1 spray into both nostrils daily as needed for allergies or rhinitis., Disp: , Rfl:    levocetirizine (XYZAL) 5 MG tablet, Take 5 mg by mouth  daily., Disp: , Rfl:    lisinopril  (ZESTRIL ) 20 MG tablet, Take 20 mg by mouth daily., Disp: , Rfl:    metFORMIN  (GLUCOPHAGE -XR) 500 MG 24 hr tablet, Take 1 tablet (500 mg total) by mouth daily with breakfast., Disp: 90 tablet, Rfl: 0   methylphenidate  (RITALIN ) 10 MG tablet, Take 10 mg by mouth 2 (two) times daily., Disp: , Rfl: 0   montelukast  (SINGULAIR ) 10 MG tablet, Take 10 mg by mouth at bedtime., Disp: , Rfl:    NON FORMULARY, Inject 1 Dose into the skin once a week. Allergy  shots from [facility]  at [place]. Patient not sure about the name of medicine, Disp: , Rfl:    ondansetron  (ZOFRAN ) 4 MG tablet, Take 1 tablet (4 mg total) by mouth every 8 (eight) hours as needed for up to 15 doses for nausea or vomiting., Disp: 15 tablet, Rfl: 0   oxyCODONE  (ROXICODONE ) 5 MG immediate release tablet, Take 1 tablet (5 mg total) by mouth every 6 (six) hours as needed for up to 10 doses for severe pain (pain score 7-10). Begin taking after surgery on 12/08/24., Disp: 10 tablet, Rfl: 0   pantoprazole  (PROTONIX ) 40 MG tablet, TAKE 1 TABLET BY MOUTH DAILY TO PREVENT INDIGESTION AND HEARTBURN, Disp: 90 tablet, Rfl: 3   rosuvastatin  (CRESTOR ) 40 MG tablet, Take  1 tablet Daily for Cholesterol            \    TAKE  BY MOUTH, Disp: 90 tablet, Rfl: 3   SYMBICORT 160-4.5 MCG/ACT inhaler, Inhale 2 puffs into the lungs 2 (two) times daily., Disp: , Rfl: 6   tirzepatide  (ZEPBOUND ) 2.5 MG/0.5ML Pen, Inject 2.5 mg into the skin once a week., Disp: 2 mL, Rfl: 0   traZODone  (DESYREL ) 100 MG tablet, Take 1 tablet (100 mg total) by mouth at bedtime., Disp: 30 tablet, Rfl: 11   VITAMIN D  PO, Take 10,000 Units by mouth daily., Disp: , Rfl:    azelastine  (ASTELIN ) 0.1 % nasal spray, Place 1 spray into both nostrils 2 (two) times daily. Use in each nostril as directed, Disp: 30 mL, Rfl: 12  Current Facility-Administered Medications:    ipratropium-albuterol  (DUONEB) 0.5-2.5 (3) MG/3ML nebulizer solution 3 mL, 3 mL,  Nebulization, Once,   "

## 2024-11-18 NOTE — H&P (View-Only) (Signed)
 "    Patient ID: Haley White, female    DOB: 03-Jan-1956, 69 y.o.   MRN: [id number]  Chief Complaint  Patient presents with   Pre-op Exam      ICD-10-CM   1. Symptomatic mammary hypertrophy  N62     2. Macromastia  N62        History of Present Illness: Haley White is a 69 y.o.  female  with a history of macromastia.  She presents for preoperative evaluation for upcoming procedure, Bilateral Breast Reduction with liposuction, scheduled for 12/08/2024 with Dr.  Lowery  The patient has not had problems with anesthesia.  Patient reports she had a mammogram in August which was negative.  She denies any personal history of breast cancer.  She reports her mother had breast cancer.  Patient denies any recent cardiac issues.  She does report she takes aspirin .  Patient reports she is not a smoker.  Patient denies taking any birth control or hormone replacement.  She denies any history of greater than 3 miscarriages.  Patient does have personal history of blood clot.  She denies any family history of blood clots.  She denies any personal or family history of clotting diseases.  Patient does report she had surgery on her hand this past Monday.  She otherwise denies any recent traumas or infections.  She denies any history of stroke or heart attack.  She denies any history of Crohn's disease or ulcerative colitis.  She denies any history of COPD.  She does state that she has asthma which is well-controlled.  She denies any history of cancer.  She does report she has varicosities to her lower extremities.  She denies any recent fevers chills or changes in her health.  Patient reports that she has sleep apnea and is currently on CPAP.  She states that her sleep apnea is well-controlled with her CPAP machine.  Patient states she is unsure of what cup size she has currently.  She states that she would like to be around a C cup.  Discussed with patient that cup size cannot be guaranteed.  Patient expressed  understanding.  I asked the patient about the history of suicide attempt by drug ingestion in the chart.  She states that several years ago, she was on prednisone  from pain from pseudogout which did not help her mental health.  She states that at that time, she had familial issues and she had recently moved.  She states that she has been doing well since then. We discussed that surgery could be emotionally and mentally taxing and she expressed understanding to this. She states she regularly follows up with a therapist and feels that she has good support at home.    Summary of Previous Visit: Patient was seen for initial consult by Dr. Lowery on 05/31/2024.  At this visit, patient complained of upper back and neck pain due to her enlarged breasts.  On exam, her STN on the right was 32 cm and her STN on the left was 33 cm.  Her BMI was 44.3 kg/m.  She estimated amount of breast tissue to be removed at the time of surgery was 750-800 g bilaterally.  Patient was found to be a good candidate for breast reduction.  Physical therapy was prescribed for her.  Estimated excess breast tissue to be removed at time of surgery: 750-800 grams  Job: Retired  PMH Significant for: Hypertension,  aortic atherosclerosis, acute pulmonary embolism, asthma, OSA, anxiety, macromastia  Per chart review, clearance was received from patient's cardiologist [facility] remote hold.  Per cardiology note, she may proceed with breast reduction surgery at acceptable risk.  She may interrupt ASA as needed per surgeon.    Past Medical History: Allergies: Allergies[1]  Current Medications: Current Medications[2]  Past Medical Problems: Past Medical History:  Diagnosis Date   ADHD    Allergy     Anxiety    Arthritis    Asthma    Back pain    Cataract    Chest pain    Depression    Fibromyalgia    GERD (gastroesophageal reflux disease)    Gout    Hx pulmonary embolism    Hyperlipidemia    Hypertension    Joint  pain    Obese    Osteoarthritis    Pain in left ankle    Palpitations    Pre-diabetes    Sleep apnea    SOB (shortness of breath)    Vitamin D  deficiency     Past Surgical History: Past Surgical History:  Procedure Laterality Date   ANKLE SURGERY Left    APPENDECTOMY  1984   APPENDECTOMY     CATARACT EXTRACTION W/ INTRAOCULAR LENS  IMPLANT, BILATERAL  2014   CESAREAN SECTION  1984, 1987, 1994   X3   CESAREAN SECTION     x3   COLONOSCOPY  11/16/2013   w/Brodie    EXTERNAL FIXATION LEG Left 06/04/2023   Procedure: EXTERNAL FIXATION LEG;  Surgeon: Barton Drape, MD;  Location: [facility];  Service: Orthopedics;  Laterality: Left;   EYE SURGERY Bilateral    lasix   FOOT FRACTURE SURGERY Left    FOOT SURGERY Left 2002   FRACTURE SURGERY Left    left ankle   HARDWARE REMOVAL Left 12/03/2023   Procedure: HARDWARE REMOVAL OF SYNDEMOSIS;  Surgeon: Barton Drape, MD;  Location: [facility];  Service: Orthopedics;  Laterality: Left;   KNEE SURGERY Left 1998   ORIF ANKLE FRACTURE Left 06/04/2023   Procedure: OPEN REDUCTION INTERNAL FIXATION (ORIF) ANKLE FRACTURE vs EX FIX;  Surgeon: Barton Drape, MD;  Location: [facility];  Service: Orthopedics;  Laterality: Left;   ROTATOR CUFF REPAIR Right 2007   shoulder     torn rotator cuff   TUBAL LIGATION  1994   TUBAL LIGATION      Social History: Social History   Socioeconomic History   Marital status: Divorced    Spouse name: Not on file   Number of children: Not on file   Years of education: Not on file   Highest education level: Not on file  Occupational History   Occupation: Retired Barrister's Clerk  Tobacco Use   Smoking status: Former    Types: Cigarettes    Passive exposure: Never   Smokeless tobacco: Never  Vaping Use   Vaping status: Never Used  Substance and Sexual Activity   Alcohol use: Yes    Comment: occasionally   Drug use: Never    Comment: 06/03/2022-gummies   Sexual activity: Yes     Birth control/protection: Post-menopausal, Surgical  Other Topics Concern   Not on file  Social History Narrative   ** Merged History Encounter **       Social Drivers of Health   Tobacco Use: Medium Risk (11/18/2024)   Patient History    Smoking Tobacco Use: Former    Smokeless Tobacco Use: Never    Passive Exposure: Never  Physicist, Medical Strain: Not on [employer]  Insecurity: Patient Declined (06/03/2023)   Hunger Vital Sign    Worried About Running Out of Food in the Last Year: Patient declined    Ran Out of Food in the Last Year: Patient declined  Transportation Needs: No Transportation Needs (06/02/2023)   PRAPARE - Administrator, Civil Service (Medical): No    Lack of Transportation (Non-Medical): No  Physical Activity: Not on file  Stress: Not on file  Social Connections: Not on file  Intimate Partner Violence: Not At Risk (06/03/2023)   Humiliation, Afraid, Rape, and Kick questionnaire    Fear of Current or Ex-Partner: No    Emotionally Abused: No    Physically Abused: No    Sexually Abused: No  Depression (PHQ2-9): Low Risk (07/26/2024)   Depression (PHQ2-9)    PHQ-2 Score: 0  Alcohol Screen: Not on file  Housing: Low Risk (06/02/2023)   Housing    Last Housing Risk Score: 0  Utilities: Not At Risk (06/03/2023)   AHC Utilities    Threatened with loss of utilities: No  Health Literacy: Not on file    Family History: Family History  Problem Relation Age of Onset   Dementia Father    Heart disease Father    Hypertension Father    Hyperlipidemia Father    Deep vein thrombosis Father    Depression Father    Anxiety disorder Father    Anxiety disorder Sister    Colon cancer Neg Hx    Colon polyps Neg Hx    Esophageal cancer Neg Hx    Stomach cancer Neg Hx    Rectal cancer Neg Hx     Review of Systems: Denies any recent fevers or chills  Physical Exam: Vital Signs BP (!) 148/83 (BP Location: Left Arm, Patient Position: Sitting, Cuff Size:  Large)   Pulse 75   Ht 5' 3 (1.6 m)   Wt 222 lb 9.6 oz (101 kg)   LMP 11/26/2011   SpO2 98%   BMI 39.43 kg/m   Physical Exam  Constitutional:      General: Not in acute distress.    Appearance: Normal appearance. Not ill-appearing.  HENT:     Head: Normocephalic and atraumatic.  Eyes:     Pupils: Pupils are equal, round Neck:     Musculoskeletal: Normal range of motion.  Cardiovascular:     Rate and Rhythm: Normal rate Pulmonary:     Effort: Pulmonary effort is normal. No respiratory distress.  Musculoskeletal: Splint/dressings to the left wrist is noted Skin:    General: Skin is warm and dry.     Findings: No erythema or rash.  Neurological:     Mental Status: Alert and oriented to person, place, and time. Mental status is at baseline.  Psychiatric:        Mood and Affect: Mood normal.        Behavior: Behavior normal.    Assessment/Plan: The patient is scheduled for bilateral breast reduction with Dr. Lowery.  Risks, benefits, and alternatives of procedure discussed, questions answered and consent obtained.    Discussed with the patient that I will have to confirm with Dr. Lowery that we can proceed with surgery as scheduled with her most recent surgery.  Patient expressed understanding.  Smoking Status: Non-smoker; Counseling Given?  N/A Last Mammogram: 06/14/24; Results: BI-RADS Category 1 negative  Caprini Score: 10; Risk Factors include: Age, BMI greater than 25, personal history of thrombosis, varicose veins, recent surgery and length of planned surgery. Recommendation  for mechanical and possible pharmacological pharmacological prophylaxis. Encourage early ambulation.  Will discuss possibility of postoperative Lovenox  with Dr. Lowery.  Pictures obtained: @consult   Post-op Rx sent to pharmacy: Oxycodone , Zofran , Keflex    We will plan to have the patient hold aspirin , multivitamins, vitamins and supplements at least 1 week prior to surgery.  Instructed  the patient to hold lisinopril  and metformin  as well as Ritalin  the day of surgery.  Patient reports she has not started taking Zepbound  yet.  She expressed understanding as to what medications to hold.  Patient was provided with the breast reduction and General Surgical Risk consent document and Pain Medication Agreement prior to their appointment.  They had adequate time to read through the risk consent documents and Pain Medication Agreement. We also discussed them in person together during this preop appointment. All of their questions were answered to their satisfaction.  Recommended calling if they have any further questions.  Risk consent form and Pain Medication Agreement to be scanned into patient's chart.  The risk that can be encountered with breast reduction were discussed and include the following but not limited to these:  Breast asymmetry, fluid accumulation, firmness of the breast, inability to breast feed, loss of nipple or areola, skin loss, decrease or no nipple sensation, fat necrosis of the breast tissue, bleeding, infection, healing delay.  There are risks of anesthesia, changes to skin sensation and injury to nerves or blood vessels.  The muscle can be temporarily or permanently injured.  You may have an allergic reaction to tape, suture, glue, blood products which can result in skin discoloration, swelling, pain, skin lesions, poor healing.  Any of these can lead to the need for revisonal surgery or stage procedures.  A reduction has potential to interfere with diagnostic procedures.  Nipple or breast piercing can increase risks of infection.  This procedure is best done when the breast is fully developed.  Changes in the breast will continue to occur over time.  Pregnancy can alter the outcomes of previous breast reduction surgery, weight gain and weigh loss can also effect the long term appearance.   We discussed that patient's sleep apnea may put her at higher risk of complications  with anesthesia.  She expressed understanding.  Electronically signed by: Estefana FORBES Peck, PA-C 11/19/2024 4:31 PM      [1]  Allergies Allergen Reactions   Molds & Smuts Anaphylaxis   Bactrim  [Sulfamethoxazole -Trimethoprim ] Other (See Comments)    Thrush   Biaxin [Clarithromycin]     GI upset   Biaxin [Clarithromycin] Other (See Comments)    GI Upset   Prednisone      Was admitted for self harm when on prednisone  before   Prednisone  Other (See Comments)    Suicidal   Serevent [Salmeterol]     Palpitations   Tequin [Gatifloxacin]     GI upset. thrush   Tequin [Gatifloxacin] Other (See Comments)    GI upset, Thrush   Serevent [Salmeterol] Palpitations  [2]  Current Outpatient Medications:    albuterol  (VENTOLIN  HFA) 108 (90 Base) MCG/ACT inhaler, Inhale 2 puffs into the lungs every 6 (six) hours as needed for wheezing or shortness of breath., Disp: , Rfl:    amLODipine  (NORVASC ) 5 MG tablet, Take 1 tablet (5 mg total) by mouth daily., Disp: 90 tablet, Rfl: 3   aspirin  EC 81 MG tablet, Take 1 tablet (81 mg total) by mouth daily. Swallow whole., Disp: , Rfl:    budesonide  (PULMICORT ) 0.5 MG/2ML nebulizer solution, Take  2 mLs (0.5 mg total) by nebulization daily as needed., Disp: 2 mL, Rfl: 2   buPROPion  (WELLBUTRIN  XL) 300 MG 24 hr tablet, Take 300 mg by mouth daily., Disp: , Rfl:    cephALEXin  (KEFLEX ) 500 MG capsule, Take 1 capsule (500 mg total) by mouth 4 (four) times daily for 3 days., Disp: 12 capsule, Rfl: 0   cetirizine  (ZYRTEC  ALLERGY ) 10 MG tablet, Take 1 tablet (10 mg total) by mouth daily., Disp: 90 tablet, Rfl: 3   EPINEPHrine  0.3 mg/0.3 mL IJ SOAJ injection, See admin instructions., Disp: , Rfl:    FLUoxetine  (PROZAC ) 20 MG capsule, Take 60 mg by mouth daily., Disp: , Rfl:    fluticasone  (FLONASE) 50 MCG/ACT nasal spray, Place 1 spray into both nostrils daily as needed for allergies or rhinitis., Disp: , Rfl:    levocetirizine (XYZAL) 5 MG tablet, Take 5 mg by mouth  daily., Disp: , Rfl:    lisinopril  (ZESTRIL ) 20 MG tablet, Take 20 mg by mouth daily., Disp: , Rfl:    metFORMIN  (GLUCOPHAGE -XR) 500 MG 24 hr tablet, Take 1 tablet (500 mg total) by mouth daily with breakfast., Disp: 90 tablet, Rfl: 0   methylphenidate  (RITALIN ) 10 MG tablet, Take 10 mg by mouth 2 (two) times daily., Disp: , Rfl: 0   montelukast  (SINGULAIR ) 10 MG tablet, Take 10 mg by mouth at bedtime., Disp: , Rfl:    NON FORMULARY, Inject 1 Dose into the skin once a week. Allergy  shots from [facility]  at [place]. Patient not sure about the name of medicine, Disp: , Rfl:    ondansetron  (ZOFRAN ) 4 MG tablet, Take 1 tablet (4 mg total) by mouth every 8 (eight) hours as needed for up to 15 doses for nausea or vomiting., Disp: 15 tablet, Rfl: 0   oxyCODONE  (ROXICODONE ) 5 MG immediate release tablet, Take 1 tablet (5 mg total) by mouth every 6 (six) hours as needed for up to 10 doses for severe pain (pain score 7-10). Begin taking after surgery on 12/08/24., Disp: 10 tablet, Rfl: 0   pantoprazole  (PROTONIX ) 40 MG tablet, TAKE 1 TABLET BY MOUTH DAILY TO PREVENT INDIGESTION AND HEARTBURN, Disp: 90 tablet, Rfl: 3   rosuvastatin  (CRESTOR ) 40 MG tablet, Take  1 tablet Daily for Cholesterol            \    TAKE  BY MOUTH, Disp: 90 tablet, Rfl: 3   SYMBICORT 160-4.5 MCG/ACT inhaler, Inhale 2 puffs into the lungs 2 (two) times daily., Disp: , Rfl: 6   tirzepatide  (ZEPBOUND ) 2.5 MG/0.5ML Pen, Inject 2.5 mg into the skin once a week., Disp: 2 mL, Rfl: 0   traZODone  (DESYREL ) 100 MG tablet, Take 1 tablet (100 mg total) by mouth at bedtime., Disp: 30 tablet, Rfl: 11   VITAMIN D  PO, Take 10,000 Units by mouth daily., Disp: , Rfl:    azelastine  (ASTELIN ) 0.1 % nasal spray, Place 1 spray into both nostrils 2 (two) times daily. Use in each nostril as directed, Disp: 30 mL, Rfl: 12  Current Facility-Administered Medications:    ipratropium-albuterol  (DUONEB) 0.5-2.5 (3) MG/3ML nebulizer solution 3 mL, 3 mL,  Nebulization, Once,   "

## 2024-11-19 MED ORDER — OXYCODONE HCL 5 MG PO TABS: 5.0000 mg | ORAL_TABLET | Freq: Four times a day (QID) | ORAL | 0 refills | Status: DC | PRN

## 2024-11-19 MED ORDER — ONDANSETRON HCL 4 MG PO TABS: 4.0000 mg | ORAL_TABLET | Freq: Three times a day (TID) | ORAL | 0 refills | Status: AC | PRN

## 2024-11-19 MED ORDER — CEPHALEXIN 500 MG PO CAPS: 500.0000 mg | ORAL_CAPSULE | Freq: Four times a day (QID) | ORAL | 0 refills | Status: AC

## 2024-11-22 ENCOUNTER — Encounter (HOSPITAL_BASED_OUTPATIENT_CLINIC_OR_DEPARTMENT_OTHER): Payer: Self-pay

## 2024-11-22 ENCOUNTER — Ambulatory Visit (HOSPITAL_BASED_OUTPATIENT_CLINIC_OR_DEPARTMENT_OTHER): Attending: Podiatry | Admitting: Physical Therapy

## 2024-11-25 ENCOUNTER — Other Ambulatory Visit (INDEPENDENT_AMBULATORY_CARE_PROVIDER_SITE_OTHER): Payer: Self-pay | Admitting: Internal Medicine

## 2024-11-25 DIAGNOSIS — R7303 Prediabetes: Secondary | ICD-10-CM

## 2024-12-01 ENCOUNTER — Other Ambulatory Visit: Payer: Self-pay

## 2024-12-01 ENCOUNTER — Encounter (HOSPITAL_BASED_OUTPATIENT_CLINIC_OR_DEPARTMENT_OTHER): Payer: Self-pay | Admitting: Plastic Surgery

## 2024-12-01 ENCOUNTER — Encounter: Payer: Self-pay | Admitting: Podiatry

## 2024-12-08 ENCOUNTER — Other Ambulatory Visit: Payer: Self-pay

## 2024-12-08 ENCOUNTER — Ambulatory Visit (HOSPITAL_BASED_OUTPATIENT_CLINIC_OR_DEPARTMENT_OTHER): Admitting: Certified Registered"

## 2024-12-08 ENCOUNTER — Ambulatory Visit (HOSPITAL_BASED_OUTPATIENT_CLINIC_OR_DEPARTMENT_OTHER)
Admission: RE | Admit: 2024-12-08 | Discharge: 2024-12-08 | Disposition: A | Discharge status: Home / Self Care | Source: Home / Self Care | Attending: Plastic Surgery | Admitting: Plastic Surgery

## 2024-12-08 ENCOUNTER — Encounter (HOSPITAL_BASED_OUTPATIENT_CLINIC_OR_DEPARTMENT_OTHER)
Admission: RE | Disposition: A | Discharge status: Home / Self Care | Payer: Self-pay | Source: Home / Self Care | Attending: Plastic Surgery

## 2024-12-08 ENCOUNTER — Other Ambulatory Visit: Payer: Self-pay | Admitting: Plastic Surgery

## 2024-12-08 ENCOUNTER — Encounter (HOSPITAL_BASED_OUTPATIENT_CLINIC_OR_DEPARTMENT_OTHER): Payer: Self-pay | Admitting: Plastic Surgery

## 2024-12-08 DIAGNOSIS — M549 Dorsalgia, unspecified: Secondary | ICD-10-CM | POA: Diagnosis not present

## 2024-12-08 DIAGNOSIS — N62 Hypertrophy of breast: Secondary | ICD-10-CM | POA: Diagnosis not present

## 2024-12-08 DIAGNOSIS — M542 Cervicalgia: Secondary | ICD-10-CM | POA: Diagnosis not present

## 2024-12-08 DIAGNOSIS — Z01818 Encounter for other preprocedural examination: Secondary | ICD-10-CM

## 2024-12-08 LAB — GLUCOSE, CAPILLARY
Glucose-Capillary: 94 mg/dL (ref 70–99)
Glucose-Capillary: 113 mg/dL — ABNORMAL HIGH (ref 70–99)

## 2024-12-08 MED ORDER — DROPERIDOL 2.5 MG/ML IJ SOLN
INTRAMUSCULAR | Status: AC
  Filled 2024-12-08: qty 2

## 2024-12-08 MED ORDER — SODIUM CHLORIDE 0.9 % IV SOLN: 250.0000 mL | INTRAVENOUS | Status: DC | PRN

## 2024-12-08 MED ORDER — LIDOCAINE-EPINEPHRINE 1 %-1:100000 IJ SOLN
INTRAMUSCULAR | Status: DC | PRN
  Administered 2024-12-08: 25 mL

## 2024-12-08 MED ORDER — ACETAMINOPHEN 325 MG RE SUPP: 650.0000 mg | RECTAL | Status: DC | PRN

## 2024-12-08 MED ORDER — CEFAZOLIN SODIUM-DEXTROSE 2-4 GM/100ML-% IV SOLN
INTRAVENOUS | Status: AC
  Filled 2024-12-08: qty 100

## 2024-12-08 MED ORDER — ONDANSETRON HCL 4 MG/2ML IJ SOLN
INTRAMUSCULAR | Status: DC | PRN
  Administered 2024-12-08: 4 mg via INTRAVENOUS

## 2024-12-08 MED ORDER — FENTANYL CITRATE (PF) 100 MCG/2ML IJ SOLN
INTRAMUSCULAR | Status: DC | PRN
  Administered 2024-12-08 (×4): 50 ug via INTRAVENOUS

## 2024-12-08 MED ORDER — FENTANYL CITRATE (PF) 100 MCG/2ML IJ SOLN: 25.0000 ug | INTRAMUSCULAR | Status: DC | PRN

## 2024-12-08 MED ORDER — EPHEDRINE SULFATE (PRESSORS) 25 MG/5ML IV SOSY
PREFILLED_SYRINGE | INTRAVENOUS | Status: DC | PRN
  Administered 2024-12-08 (×2): 5 mg via INTRAVENOUS

## 2024-12-08 MED ORDER — PROPOFOL 10 MG/ML IV BOLUS
INTRAVENOUS | Status: AC
  Filled 2024-12-08: qty 20

## 2024-12-08 MED ORDER — PROPOFOL 10 MG/ML IV BOLUS
INTRAVENOUS | Status: DC | PRN
  Administered 2024-12-08: 30 mg via INTRAVENOUS
  Administered 2024-12-08: 130 mg via INTRAVENOUS

## 2024-12-08 MED ORDER — MIDAZOLAM HCL 2 MG/2ML IJ SOLN
INTRAMUSCULAR | Status: AC
  Filled 2024-12-08: qty 2

## 2024-12-08 MED ORDER — DEXAMETHASONE SOD PHOSPHATE PF 10 MG/ML IJ SOLN
INTRAMUSCULAR | Status: DC | PRN
  Administered 2024-12-08: 4 mg via INTRAVENOUS

## 2024-12-08 MED ORDER — SODIUM CHLORIDE 0.9% FLUSH: 3.0000 mL | INTRAVENOUS | Status: DC | PRN

## 2024-12-08 MED ORDER — OXYCODONE HCL 5 MG/5ML PO SOLN: 5.0000 mg | Freq: Once | ORAL | Status: AC | PRN

## 2024-12-08 MED ORDER — ACETAMINOPHEN 10 MG/ML IV SOLN
INTRAVENOUS | Status: AC
  Filled 2024-12-08: qty 100

## 2024-12-08 MED ORDER — CHLORHEXIDINE GLUCONATE CLOTH 2 % EX PADS: 6.0000 | MEDICATED_PAD | Freq: Once | CUTANEOUS | Status: DC

## 2024-12-08 MED ORDER — LIDOCAINE HCL 1 % IJ SOLN
INTRAVENOUS | Status: DC | PRN
  Administered 2024-12-08: 650 mL

## 2024-12-08 MED ORDER — FENTANYL CITRATE (PF) 100 MCG/2ML IJ SOLN
25.0000 ug | INTRAMUSCULAR | Status: DC | PRN
  Administered 2024-12-08 (×2): 25 ug via INTRAVENOUS
  Administered 2024-12-08: 50 ug via INTRAVENOUS

## 2024-12-08 MED ORDER — CEFAZOLIN SODIUM-DEXTROSE 2-4 GM/100ML-% IV SOLN
2.0000 g | INTRAVENOUS | Status: AC
  Administered 2024-12-08: 2 g via INTRAVENOUS

## 2024-12-08 MED ORDER — FENTANYL CITRATE (PF) 100 MCG/2ML IJ SOLN
INTRAMUSCULAR | Status: AC
  Filled 2024-12-08: qty 2

## 2024-12-08 MED ORDER — OXYCODONE HCL 5 MG PO TABS: 5.0000 mg | ORAL_TABLET | ORAL | Status: DC | PRN

## 2024-12-08 MED ORDER — LIDOCAINE 2% (20 MG/ML) 5 ML SYRINGE
INTRAMUSCULAR | Status: AC
  Filled 2024-12-08: qty 5

## 2024-12-08 MED ORDER — PHENYLEPHRINE 80 MCG/ML (10ML) SYRINGE FOR IV PUSH (FOR BLOOD PRESSURE SUPPORT)
PREFILLED_SYRINGE | INTRAVENOUS | Status: AC
  Filled 2024-12-08: qty 10

## 2024-12-08 MED ORDER — ACETAMINOPHEN 10 MG/ML IV SOLN: 1000.0000 mg | Freq: Once | INTRAVENOUS | Status: DC | PRN

## 2024-12-08 MED ORDER — BUPIVACAINE LIPOSOME 1.3 % IJ SUSP
INTRAMUSCULAR | Status: AC
  Filled 2024-12-08: qty 20

## 2024-12-08 MED ORDER — MIDAZOLAM HCL (PF) 2 MG/2ML IJ SOLN
INTRAMUSCULAR | Status: DC | PRN
  Administered 2024-12-08: 2 mg via INTRAVENOUS

## 2024-12-08 MED ORDER — OXYCODONE HCL 5 MG PO TABS
5.0000 mg | ORAL_TABLET | Freq: Once | ORAL | Status: AC | PRN
  Administered 2024-12-08: 5 mg via ORAL

## 2024-12-08 MED ORDER — NITROGLYCERIN 2 % TD OINT
TOPICAL_OINTMENT | TRANSDERMAL | Status: AC
  Filled 2024-12-08: qty 30

## 2024-12-08 MED ORDER — ONDANSETRON HCL 4 MG/2ML IJ SOLN
INTRAMUSCULAR | Status: AC
  Filled 2024-12-08: qty 2

## 2024-12-08 MED ORDER — SCOPOLAMINE 1 MG/3DAYS TD PT72
1.0000 | MEDICATED_PATCH | TRANSDERMAL | Status: DC
  Administered 2024-12-08: 1 via TRANSDERMAL

## 2024-12-08 MED ORDER — SUGAMMADEX SODIUM 200 MG/2ML IV SOLN
INTRAVENOUS | Status: DC | PRN
  Administered 2024-12-08: 200 mg via INTRAVENOUS

## 2024-12-08 MED ORDER — EPHEDRINE 5 MG/ML INJ
INTRAVENOUS | Status: AC
  Filled 2024-12-08: qty 5

## 2024-12-08 MED ORDER — NITROGLYCERIN 2 % TD OINT
TOPICAL_OINTMENT | TRANSDERMAL | Status: DC | PRN
  Administered 2024-12-08: 1 [in_us] via TOPICAL

## 2024-12-08 MED ORDER — ACETAMINOPHEN 500 MG PO TABS: 1000.0000 mg | ORAL_TABLET | Freq: Once | ORAL | Status: DC

## 2024-12-08 MED ORDER — DEXAMETHASONE SOD PHOSPHATE PF 10 MG/ML IJ SOLN
INTRAMUSCULAR | Status: AC
  Filled 2024-12-08: qty 1

## 2024-12-08 MED ORDER — SCOPOLAMINE 1 MG/3DAYS TD PT72
MEDICATED_PATCH | TRANSDERMAL | Status: AC
  Filled 2024-12-08: qty 1

## 2024-12-08 MED ORDER — LIDOCAINE 2% (20 MG/ML) 5 ML SYRINGE
INTRAMUSCULAR | Status: DC | PRN
  Administered 2024-12-08: 60 mg via INTRAVENOUS

## 2024-12-08 MED ORDER — PHENYLEPHRINE HCL (PRESSORS) 10 MG/ML IV SOLN
INTRAVENOUS | Status: DC | PRN
  Administered 2024-12-08 (×2): 160 ug via INTRAVENOUS
  Administered 2024-12-08: 80 ug via INTRAVENOUS
  Administered 2024-12-08 (×2): 160 ug via INTRAVENOUS
  Administered 2024-12-08 (×3): 80 ug via INTRAVENOUS

## 2024-12-08 MED ORDER — OXYCODONE HCL 5 MG PO TABS
ORAL_TABLET | ORAL | Status: AC
  Filled 2024-12-08: qty 1

## 2024-12-08 MED ORDER — SODIUM CHLORIDE 0.9% FLUSH: 3.0000 mL | Freq: Two times a day (BID) | INTRAVENOUS | Status: DC

## 2024-12-08 MED ORDER — ACETAMINOPHEN 325 MG PO TABS: 650.0000 mg | ORAL_TABLET | ORAL | Status: DC | PRN

## 2024-12-08 MED ORDER — ROCURONIUM BROMIDE 10 MG/ML (PF) SYRINGE
PREFILLED_SYRINGE | INTRAVENOUS | Status: AC
  Filled 2024-12-08: qty 10

## 2024-12-08 MED ORDER — VASHE WOUND IRRIGATION OPTIME
TOPICAL | Status: DC | PRN
  Administered 2024-12-08: 34 [oz_av]

## 2024-12-08 MED ORDER — ROCURONIUM BROMIDE 100 MG/10ML IV SOLN
INTRAVENOUS | Status: DC | PRN
  Administered 2024-12-08: 50 mg via INTRAVENOUS

## 2024-12-08 MED ORDER — ACETAMINOPHEN 10 MG/ML IV SOLN
INTRAVENOUS | Status: DC | PRN
  Administered 2024-12-08: 1000 mg via INTRAVENOUS

## 2024-12-08 MED ORDER — DROPERIDOL 2.5 MG/ML IJ SOLN
0.6250 mg | Freq: Once | INTRAMUSCULAR | Status: AC | PRN
  Administered 2024-12-08: 0.625 mg via INTRAVENOUS

## 2024-12-08 MED ORDER — CEPHALEXIN 500 MG PO CAPS: 500.0000 mg | ORAL_CAPSULE | Freq: Four times a day (QID) | ORAL | 0 refills | Status: AC

## 2024-12-08 MED ORDER — LACTATED RINGERS IV SOLN: INTRAVENOUS | Status: DC

## 2024-12-08 MED ORDER — BUPIVACAINE LIPOSOME 1.3 % IJ SUSP
INTRAMUSCULAR | Status: DC | PRN
  Administered 2024-12-08: 40 mL

## 2024-12-08 NOTE — Anesthesia Postprocedure Evaluation (Signed)
"   Anesthesia Post Note  Patient: Haley White  Procedure(s) Performed: BREAST REDUCTION WITH LIPOSUCTION (Bilateral: Breast)     Patient location during evaluation: PACU Anesthesia Type: General Level of consciousness: awake and alert Pain management: pain level controlled Vital Signs Assessment: post-procedure vital signs reviewed and stable Respiratory status: spontaneous breathing, nonlabored ventilation, respiratory function stable and patient connected to nasal cannula oxygen Cardiovascular status: blood pressure returned to baseline and stable Postop Assessment: no apparent nausea or vomiting Anesthetic complications: no   No notable events documented.  Last Vitals:  Vitals:   12/08/24 1230 12/08/24 1245  BP:  126/60  Pulse:  84  Resp:  20  Temp:  (!) 36.3 C  SpO2: 95% 93%    Last Pain:  Vitals:   12/08/24 1245  PainSc: 5                  Haley White      "

## 2024-12-08 NOTE — Interval H&P Note (Signed)
 History and Physical Interval Note:  12/08/2024 8:15 AM  Haley White  has presented today for surgery, with the diagnosis of n62.  The various methods of treatment have been discussed with the patient and family. After consideration of risks, benefits and other options for treatment, the patient has consented to  Procedures: BREAST REDUCTION WITH LIPOSUCTION (Bilateral) as a surgical intervention.  The patient's history has been reviewed, patient examined, no change in status, stable for surgery.  I have reviewed the patient's chart and labs.  Questions were answered to the patient's satisfaction.     Estefana RAMAN Jairon Ripberger

## 2024-12-08 NOTE — Anesthesia Preprocedure Evaluation (Addendum)
"                                    Anesthesia Evaluation  Patient identified by MRN, date of birth, ID band Patient awake    Reviewed: Allergy  & Precautions, NPO status , Patient's Chart, lab work & pertinent test results  Airway Mallampati: III  TM Distance: >3 FB Neck ROM: Full    Dental  (+) Poor Dentition, Chipped   Pulmonary asthma , sleep apnea , former smoker   breath sounds clear to auscultation       Cardiovascular hypertension,  Rhythm:Regular Rate:Normal     Neuro/Psych  PSYCHIATRIC DISORDERS Anxiety Depression     Neuromuscular disease    GI/Hepatic Neg liver ROS,GERD  ,,  Endo/Other  negative endocrine ROS    Renal/GU negative Renal ROS     Musculoskeletal  (+) Arthritis ,  Fibromyalgia -  Abdominal   Peds  Hematology negative hematology ROS (+)   Anesthesia Other Findings   Reproductive/Obstetrics                              Anesthesia Physical Anesthesia Plan  ASA: 2  Anesthesia Plan: General   Post-op Pain Management: Toradol  IV (intra-op)* and Tylenol  PO (pre-op)*   Induction: Intravenous  PONV Risk Score and Plan: 4 or greater and Ondansetron , Dexamethasone , Midazolam  and Scopolamine  patch - Pre-op  Airway Management Planned: Oral ETT  Additional Equipment: None  Intra-op Plan:   Post-operative Plan: Extubation in OR  Informed Consent: I have reviewed the patients History and Physical, chart, labs and discussed the procedure including the risks, benefits and alternatives for the proposed anesthesia with the patient or authorized representative who has indicated his/her understanding and acceptance.     Dental advisory given  Plan Discussed with: CRNA  Anesthesia Plan Comments:          Anesthesia Quick Evaluation  "

## 2024-12-08 NOTE — Op Note (Signed)
 Breast Reduction Op note:    DATE OF PROCEDURE: 12/08/2024  LOCATION: [facility]  SURGEON: Estefana Fritter, DO  PREOPERATIVE DIAGNOSIS 1. Macromastia 2. Neck Pain 3. Back Pain  POSTOPERATIVE DIAGNOSIS 1. Macromastia 2. Neck Pain 3. Back Pain  PROCEDURES 1. Bilateral breast reduction.  Right reduction 955 g, Left reduction 955 g  COMPLICATIONS: None.  DRAINS: none  INDICATIONS FOR PROCEDURE Haley White is a 69 y.o. year-old female born on [birth year],with a history of symptomatic macromastia with concomitant back pain, neck pain, shoulder grooving from her bra.   MRN: [id number]  CONSENT Informed consent was obtained directly from the patient. The risks, benefits and alternatives were fully discussed. Specific risks including but not limited to bleeding, infection, hematoma, seroma, scarring, pain, nipple necrosis, asymmetry, poor cosmetic results, and need for further surgery were discussed. The patient's questions were answered.  DESCRIPTION OF PROCEDURE  Patient was brought into the operating room and rested on the operating room table in the supine position.  SCDs were placed and appropriate padding was performed.  Antibiotics were given. The patient underwent general anesthesia and the chest was prepped and draped in a sterile fashion.  A timeout was performed and all information was confirmed to be correct by those in the room. Tumescent was placed laterally in each breast and liposuction done for improved contour.  Right side: Preoperative markings were confirmed.  Incision lines were injected with local containing epinephrine .  After waiting for vasoconstriction, the marked lines were incised with a #15 blade.  A Wise-pattern superomedial breast reduction was performed by de-epithelializing the pedicle, using bovie to create the superomedial pedicle, and removing breast tissue from the lateral and inferior portions of the breast.  Care was taken to  not undermine the breast pedicle. Myriad and experel were placed in the pocket. Hemostasis was achieved.  The nipple was gently rotated into position and the soft tissue closed with 4-0 Monocryl.   The pocket was irrigated and hemostasis confirmed.  The deep tissues were approximated with 3-0 PDS sutures.  The skin was closed with deep dermal 3-0 Monocryl and subcuticular 4-0 Monocryl sutures.  The nipple and skin flaps had good capillary refill at the end of the procedure.    Left side: Preoperative markings were confirmed.  Incision lines were injected with local containing epinephrine .  After waiting for vasoconstriction, the marked lines were incised with a #15 blade.  A Wise-pattern superomedial breast reduction was performed by de-epithelializing the pedicle, using bovie to create the superomedial pedicle, and removing breast tissue from the lateral and inferior portions of the breast.  Myriad and Experel were placed in the pocket. Care was taken to not undermine the breast pedicle. Hemostasis was achieved.  The nipple was gently rotated into position and the soft tissue was closed with 4-0 Monocryl.  The patient was sat upright and size and shape symmetry was confirmed.  The pocket was irrigated and hemostasis confirmed.  The deep tissues were approximated with 3-0 PDS sutures. The skin was closed with deep dermal 3-0 Monocryl and subcuticular 4-0 Monocryl sutures.  Dermabond was applied.  A breast binder and ABDs were placed.  The nipple and skin flaps had good capillary refill at the end of the procedure.  The patient tolerated the procedure well. The patient was allowed to wake from anesthesia and taken to the recovery room in satisfactory condition.

## 2024-12-08 NOTE — Transfer of Care (Signed)
 Immediate Anesthesia Transfer of Care Note  Patient: Haley White  Procedure(s) Performed: BREAST REDUCTION WITH LIPOSUCTION (Bilateral: Breast)  Patient Location: PACU  Anesthesia Type:General  Level of Consciousness: drowsy  Airway & Oxygen Therapy: Patient Spontanous Breathing and Patient connected to face mask oxygen  Post-op Assessment: Report given to RN and Post -op Vital signs reviewed and stable  Post vital signs: Reviewed and stable  Last Vitals:  Vitals Value Taken Time  BP 133/74 12/08/24 11:15  Temp    Pulse 89 12/08/24 11:18  Resp 16 12/08/24 11:18  SpO2 94 % 12/08/24 11:18  Vitals shown include unfiled device data.  Last Pain:  Vitals:   12/08/24 0713  PainSc: 6       Patients Stated Pain Goal: 6 (12/08/24 0713)  Complications: No notable events documented.

## 2024-12-08 NOTE — Discharge Instructions (Addendum)
 INSTRUCTIONS FOR AFTER BREAST SURGERY   You will likely have some questions about what to expect following your operation.  The following information will help you and your family understand what to expect when you are discharged from the hospital.  It is important to follow these guidelines to help ensure a smooth recovery and reduce complication.  Postoperative instructions include information on: diet, wound care, medications and physical activity.  AFTER SURGERY Expect to go home after the procedure.  In some cases, you may need to spend one night in the hospital for observation.  DIET Breast surgery does not require a specific diet.  However, the healthier you eat the better your body will heal. It is important to increasing your protein intake.  This means limiting the foods with sugar and carbohydrates.  Focus on vegetables and some meat.  If you have liposuction during your procedure be sure to drink water.  If your urine is bright yellow, then it is concentrated, and you need to drink more water.  As a general rule after surgery, you should have 8 ounces of water every hour while awake.  If you find you are persistently nauseated or unable to take in liquids let us  know.  NO TOBACCO USE or EXPOSURE.  This will slow your healing process and lead to a wound.  WOUND CARE Leave the binder on for 3 days . Use fragrance free soap like Dial, Dove or Ivory.   After 3 days you can remove the binder to shower. Once dry apply binder or sports bra. If you have liposuction you will have a soft and spongy dressing (Lipofoam) that helps prevent creases in your skin.  Remove before you shower and then replace it.  It is also available on [employer]. If you have steri-strips / tape directly attached to your skin leave them in place. It is OK to get these wet.   No baths, pools or hot tubs for four weeks. We close your incision to leave the smallest and best-looking scar. No ointment or creams on your incisions  for four weeks.  No Neosporin (Too many skin reactions).  A few weeks after surgery you can use Mederma and start massaging the scar. We ask you to wear your binder or sports bra for the first 6 weeks around the clock, including while sleeping. This provides added comfort and helps reduce the fluid accumulation at the surgery site. NO Ice or heating pads to the operative site.  You have a very high risk of a BURN before you feel the temperature change.  ACTIVITY No heavy lifting until cleared by the doctor.  This usually means no more than a half-gallon of milk.  It is OK to walk and climb stairs. Moving your legs is very important to decrease your risk of a blood clot.  It will also help keep you from getting deconditioned.  Every 1 to 2 hours get up and walk for 5 minutes. This will help with a quicker recovery back to normal.  Let pain be your guide so you don't do too much.  This time is for you to recover.  You will be more comfortable if you sleep and rest with your head elevated either with a few pillows under you or in a recliner.  No stomach sleeping for a three months.  WORK Everyone returns to work at different times. As a rough guide, most people take at least 1 - 2 weeks off prior to returning to work. If  you need documentation for your job, give the forms to the front staff at the clinic.  DRIVING Arrange for someone to bring you home from the hospital after your surgery.  You may be able to drive a few days after surgery but not while taking any narcotics or valium.  BOWEL MOVEMENTS Constipation can occur after anesthesia and while taking pain medication.  It is important to stay ahead for your comfort.  We recommend taking Milk of Magnesia (2 tablespoons; twice a day) while taking the pain pills.  MEDICATIONS You may be prescribed should start after surgery At your preoperative visit for you history and physical you were given the following medications: Antibiotic: Start this  medication when you get home and take according to the instructions on the bottle. Zofran  4 mg:  This is to treat nausea and vomiting.  You can take this every 6 hours as needed and only if needed. Oxycodone  5 mg every 6 hours for 3 - 5 days.  This is to be used after you have taken the Motrin  or the Tylenol . 4.   Gabapentin  300 mg every 12 hours for 7 days.  Over the counter Medication to take: Ibuprofen  (Motrin ) 400 - 600 mg every 6 hour for 7 days Tylenol  500 mg every 6 hours for 7 days.  Only take the Oxycodone  after you have tried these two. MiraLAX  or stool softener of choice: Take this according to the bottle if you take the Norco.  If muscle work done:  Flexeril  5 mg every 12 hours for 7 days.  WHEN TO CALL Call your surgeon's office if any of the following occur: Fever 101 degrees F or greater Excessive bleeding or fluid from the incision site. Pain that increases over time without aid from the medications Redness, warmth, or pus draining from incision sites Persistent nausea or inability to take in liquids Severe misshapen area that underwent the operation.   Post Anesthesia Home Care Instructions  Activity: Get plenty of rest for the remainder of the day. A responsible individual must stay with you for 24 hours following the procedure.  For the next 24 hours, DO NOT: -Drive a car -Advertising copywriter -Drink alcoholic beverages -Take any medication unless instructed by your physician -Make any legal decisions or sign important papers.  Meals: Start with liquid foods such as gelatin or soup. Progress to regular foods as tolerated. Avoid greasy, spicy, heavy foods. If nausea and/or vomiting occur, drink only clear liquids until the nausea and/or vomiting subsides. Call your physician if vomiting continues.  Special Instructions/Symptoms: Your throat may feel dry or sore from the anesthesia or the breathing tube placed in your throat during surgery. If this causes  discomfort, gargle with warm salt water. The discomfort should disappear within 24 hours.  If you had a scopolamine  patch placed behind your ear for the management of post- operative nausea and/or vomiting:  1. The medication in the patch is effective for 72 hours, after which it should be removed.  Wrap patch in a tissue and discard in the trash. Wash hands thoroughly with soap and water. 2. You may remove the patch earlier than 72 hours if you experience unpleasant side effects which may include dry mouth, dizziness or visual disturbances. 3. Avoid touching the patch. Wash your hands with soap and water after contact with the patch.    Information for Discharge Teaching: EXPAREL  (bupivacaine  liposome injectable suspension)   Pain relief is important to your recovery. The goal is to control  your pain so you can move easier and return to your normal activities as soon as possible after your procedure. Your physician may use several types of medicines to manage pain, swelling, and more.  Your surgeon or anesthesiologist gave you EXPAREL (bupivacaine ) to help control your pain after surgery.  EXPAREL  is a local anesthetic designed to release slowly over an extended period of time to provide pain relief by numbing the tissue around the surgical site. EXPAREL  is designed to release pain medication over time and can control pain for up to 72 hours. Depending on how you respond to EXPAREL , you may require less pain medication during your recovery. EXPAREL  can help reduce or eliminate the need for opioids during the first few days after surgery when pain relief is needed the most. EXPAREL  is not an opioid and is not addictive. It does not cause sleepiness or sedation.   Important! A teal colored band has been placed on your arm with the date, time and amount of EXPAREL  you have received. Please leave this armband in place for the full 96 hours following administration, and then you may remove the band.  If you return to the hospital for any reason within 96 hours following the administration of EXPAREL , the armband provides important information that your health care providers to know, and alerts them that you have received this anesthetic.    Possible side effects of EXPAREL : Temporary loss of sensation or ability to move in the area where medication was injected. Nausea, vomiting, constipation Rarely, numbness and tingling in your mouth or lips, lightheadedness, or anxiety may occur. Call your doctor right away if you think you may be experiencing any of these sensations, or if you have other questions regarding possible side effects.  Follow all other discharge instructions given to you by your surgeon or nurse. Eat a healthy diet and drink plenty of water or other fluids.  Next dose of tylenol  will be at 3pm today if needed

## 2024-12-08 NOTE — Anesthesia Procedure Notes (Signed)
 Procedure Name: Intubation Date/Time: 12/08/2024 8:47 AM  Performed by: Debarah Chiquita LABOR, CRNAPre-anesthesia Checklist: Patient identified, Emergency Drugs available, Suction available and Patient being monitored Patient Re-evaluated:Patient Re-evaluated prior to induction Oxygen Delivery Method: Circle system utilized Preoxygenation: Pre-oxygenation with 100% oxygen Induction Type: IV induction Ventilation: Mask ventilation without difficulty and Oral airway inserted - appropriate to patient size Laryngoscope Size: Mac and 3 Grade View: Grade II Tube type: Oral Tube size: 7.0 mm Number of attempts: 1 Airway Equipment and Method: Stylet, Oral airway and Bite block Placement Confirmation: ETT inserted through vocal cords under direct vision, positive ETCO2 and breath sounds checked- equal and bilateral Secured at: 22 cm Tube secured with: Tape Dental Injury: Teeth and Oropharynx as per pre-operative assessment

## 2024-12-09 ENCOUNTER — Encounter (HOSPITAL_BASED_OUTPATIENT_CLINIC_OR_DEPARTMENT_OTHER): Payer: Self-pay | Admitting: Plastic Surgery

## 2024-12-10 LAB — SURGICAL PATHOLOGY

## 2024-12-17 ENCOUNTER — Encounter: Admitting: Student

## 2024-12-21 ENCOUNTER — Ambulatory Visit (INDEPENDENT_AMBULATORY_CARE_PROVIDER_SITE_OTHER): Admitting: Internal Medicine

## 2024-12-31 ENCOUNTER — Encounter: Admitting: Student

## 2025-01-07 ENCOUNTER — Encounter: Admitting: Student

## 2025-01-11 ENCOUNTER — Encounter: Admitting: Student

## 2025-08-11 ENCOUNTER — Ambulatory Visit: Admitting: Adult Health
